# Patient Record
Sex: Female | Born: 1940 | Race: White | Hispanic: No | Smoking: Former smoker
Health system: Southern US, Community
[De-identification: ages and names within clinical notes are randomized; demographics above are authoritative.]

## PROBLEM LIST (undated history)

## (undated) ENCOUNTER — Emergency Department (HOSPITAL_COMMUNITY): Discharge status: Absent without leave | Payer: Medicare HMO

## (undated) DIAGNOSIS — T4145XA Adverse effect of unspecified anesthetic, initial encounter: Secondary | ICD-10-CM

## (undated) DIAGNOSIS — E785 Hyperlipidemia, unspecified: Secondary | ICD-10-CM

## (undated) DIAGNOSIS — IMO0001 Reserved for inherently not codable concepts without codable children: Secondary | ICD-10-CM

## (undated) DIAGNOSIS — K921 Melena: Secondary | ICD-10-CM

## (undated) DIAGNOSIS — C3490 Malignant neoplasm of unspecified part of unspecified bronchus or lung: Secondary | ICD-10-CM

## (undated) DIAGNOSIS — R51 Headache: Secondary | ICD-10-CM

## (undated) DIAGNOSIS — Z972 Presence of dental prosthetic device (complete) (partial): Secondary | ICD-10-CM

## (undated) DIAGNOSIS — D539 Nutritional anemia, unspecified: Secondary | ICD-10-CM

## (undated) DIAGNOSIS — Z973 Presence of spectacles and contact lenses: Secondary | ICD-10-CM

## (undated) DIAGNOSIS — J449 Chronic obstructive pulmonary disease, unspecified: Secondary | ICD-10-CM

## (undated) DIAGNOSIS — K219 Gastro-esophageal reflux disease without esophagitis: Secondary | ICD-10-CM

## (undated) DIAGNOSIS — I219 Acute myocardial infarction, unspecified: Secondary | ICD-10-CM

## (undated) DIAGNOSIS — R112 Nausea with vomiting, unspecified: Secondary | ICD-10-CM

## (undated) DIAGNOSIS — I2510 Atherosclerotic heart disease of native coronary artery without angina pectoris: Secondary | ICD-10-CM

## (undated) DIAGNOSIS — T8859XA Other complications of anesthesia, initial encounter: Secondary | ICD-10-CM

## (undated) DIAGNOSIS — K449 Diaphragmatic hernia without obstruction or gangrene: Secondary | ICD-10-CM

## (undated) DIAGNOSIS — N189 Chronic kidney disease, unspecified: Secondary | ICD-10-CM

## (undated) DIAGNOSIS — K5792 Diverticulitis of intestine, part unspecified, without perforation or abscess without bleeding: Secondary | ICD-10-CM

## (undated) DIAGNOSIS — I739 Peripheral vascular disease, unspecified: Secondary | ICD-10-CM

## (undated) DIAGNOSIS — I10 Essential (primary) hypertension: Secondary | ICD-10-CM

## (undated) DIAGNOSIS — M5481 Occipital neuralgia: Principal | ICD-10-CM

## (undated) DIAGNOSIS — R519 Headache, unspecified: Secondary | ICD-10-CM

## (undated) DIAGNOSIS — I7409 Other arterial embolism and thrombosis of abdominal aorta: Secondary | ICD-10-CM

## (undated) DIAGNOSIS — Q0700 Arnold-Chiari syndrome without spina bifida or hydrocephalus: Secondary | ICD-10-CM

## (undated) DIAGNOSIS — Z9889 Other specified postprocedural states: Secondary | ICD-10-CM

## (undated) DIAGNOSIS — G4489 Other headache syndrome: Principal | ICD-10-CM

## (undated) DIAGNOSIS — R0600 Dyspnea, unspecified: Secondary | ICD-10-CM

## (undated) DIAGNOSIS — J189 Pneumonia, unspecified organism: Secondary | ICD-10-CM

## (undated) DIAGNOSIS — H409 Unspecified glaucoma: Secondary | ICD-10-CM

## (undated) DIAGNOSIS — I723 Aneurysm of iliac artery: Secondary | ICD-10-CM

## (undated) DIAGNOSIS — I6529 Occlusion and stenosis of unspecified carotid artery: Secondary | ICD-10-CM

## (undated) HISTORY — DX: Gastro-esophageal reflux disease without esophagitis: K21.9

## (undated) HISTORY — DX: Occlusion and stenosis of unspecified carotid artery: I65.29

## (undated) HISTORY — DX: Atherosclerotic heart disease of native coronary artery without angina pectoris: I25.10

## (undated) HISTORY — PX: PR VEIN BYPASS GRAFT,AORTO-FEM-POP: 35551

## (undated) HISTORY — DX: Peripheral vascular disease, unspecified: I73.9

## (undated) HISTORY — DX: Other arterial embolism and thrombosis of abdominal aorta: I74.09

## (undated) HISTORY — PX: CORNEAL TRANSPLANT: SHX108

## (undated) HISTORY — DX: Aneurysm of iliac artery: I72.3

## (undated) HISTORY — DX: Essential (primary) hypertension: I10

## (undated) HISTORY — DX: Chronic obstructive pulmonary disease, unspecified: J44.9

## (undated) HISTORY — PX: ROTATOR CUFF REPAIR: SHX139

## (undated) HISTORY — PX: EYE SURGERY: SHX253

## (undated) HISTORY — DX: Other headache syndrome: G44.89

## (undated) HISTORY — DX: Unspecified glaucoma: H40.9

## (undated) HISTORY — DX: Acute myocardial infarction, unspecified: I21.9

## (undated) HISTORY — DX: Hyperlipidemia, unspecified: E78.5

## (undated) HISTORY — DX: Diaphragmatic hernia without obstruction or gangrene: K44.9

## (undated) HISTORY — PX: APPENDECTOMY: SHX54

## (undated) HISTORY — DX: Occipital neuralgia: M54.81

## (undated) HISTORY — DX: Nutritional anemia, unspecified: D53.9

## (undated) HISTORY — PX: CHOLECYSTECTOMY: SHX55

## (undated) HISTORY — DX: Reserved for inherently not codable concepts without codable children: IMO0001

## (undated) HISTORY — PX: ABDOMINAL HYSTERECTOMY: SHX81

---

## 1996-08-13 DIAGNOSIS — Q0700 Arnold-Chiari syndrome without spina bifida or hydrocephalus: Secondary | ICD-10-CM

## 1996-08-13 HISTORY — PX: OTHER SURGICAL HISTORY: SHX169

## 1996-08-13 HISTORY — DX: Arnold-Chiari syndrome without spina bifida or hydrocephalus: Q07.00

## 1997-12-20 ENCOUNTER — Inpatient Hospital Stay (HOSPITAL_COMMUNITY): Admission: RE | Admit: 1997-12-20 | Discharge: 1997-12-24 | Payer: Self-pay | Admitting: Neurosurgery

## 1998-01-18 ENCOUNTER — Ambulatory Visit (HOSPITAL_COMMUNITY): Admission: RE | Admit: 1998-01-18 | Discharge: 1998-01-18 | Payer: Self-pay | Admitting: Family Medicine

## 1998-02-07 ENCOUNTER — Ambulatory Visit (HOSPITAL_COMMUNITY): Admission: RE | Admit: 1998-02-07 | Discharge: 1998-02-07 | Payer: Self-pay | Admitting: Family Medicine

## 1998-04-10 ENCOUNTER — Ambulatory Visit (HOSPITAL_COMMUNITY): Admission: RE | Admit: 1998-04-10 | Discharge: 1998-04-10 | Payer: Self-pay | Admitting: Neurosurgery

## 1998-04-19 ENCOUNTER — Ambulatory Visit (HOSPITAL_COMMUNITY): Admission: RE | Admit: 1998-04-19 | Discharge: 1998-04-19 | Payer: Self-pay | Admitting: Neurosurgery

## 1998-06-20 ENCOUNTER — Emergency Department (HOSPITAL_COMMUNITY): Admission: EM | Admit: 1998-06-20 | Discharge: 1998-06-20 | Payer: Self-pay | Admitting: Emergency Medicine

## 1998-07-05 ENCOUNTER — Ambulatory Visit (HOSPITAL_COMMUNITY): Admission: RE | Admit: 1998-07-05 | Discharge: 1998-07-05 | Payer: Self-pay | Admitting: *Deleted

## 1998-08-19 ENCOUNTER — Ambulatory Visit (HOSPITAL_COMMUNITY): Admission: RE | Admit: 1998-08-19 | Discharge: 1998-08-19 | Payer: Self-pay | Admitting: Urology

## 1998-10-13 ENCOUNTER — Observation Stay (HOSPITAL_COMMUNITY): Admission: RE | Admit: 1998-10-13 | Discharge: 1998-10-14 | Payer: Self-pay | Admitting: General Surgery

## 1999-01-06 ENCOUNTER — Ambulatory Visit (HOSPITAL_COMMUNITY): Admission: RE | Admit: 1999-01-06 | Discharge: 1999-01-06 | Payer: Self-pay | Admitting: Neurology

## 1999-04-21 ENCOUNTER — Ambulatory Visit (HOSPITAL_COMMUNITY): Admission: RE | Admit: 1999-04-21 | Discharge: 1999-04-21 | Payer: Self-pay | Admitting: Family Medicine

## 1999-04-21 ENCOUNTER — Encounter: Payer: Self-pay | Admitting: Family Medicine

## 1999-04-26 ENCOUNTER — Ambulatory Visit (HOSPITAL_COMMUNITY): Admission: RE | Admit: 1999-04-26 | Discharge: 1999-04-26 | Payer: Self-pay | Admitting: Neurosurgery

## 1999-06-24 ENCOUNTER — Encounter: Payer: Self-pay | Admitting: Emergency Medicine

## 1999-06-24 ENCOUNTER — Inpatient Hospital Stay (HOSPITAL_COMMUNITY): Admission: EM | Admit: 1999-06-24 | Discharge: 1999-06-28 | Payer: Self-pay | Admitting: Cardiology

## 1999-06-26 ENCOUNTER — Encounter: Payer: Self-pay | Admitting: Cardiology

## 1999-07-19 ENCOUNTER — Ambulatory Visit (HOSPITAL_COMMUNITY): Admission: RE | Admit: 1999-07-19 | Discharge: 1999-07-19 | Payer: Self-pay | Admitting: Gastroenterology

## 1999-07-19 ENCOUNTER — Encounter: Payer: Self-pay | Admitting: Gastroenterology

## 1999-08-29 ENCOUNTER — Ambulatory Visit (HOSPITAL_COMMUNITY): Admission: RE | Admit: 1999-08-29 | Discharge: 1999-08-30 | Payer: Self-pay | Admitting: Cardiology

## 1999-09-16 ENCOUNTER — Encounter: Payer: Self-pay | Admitting: Emergency Medicine

## 1999-09-16 ENCOUNTER — Emergency Department (HOSPITAL_COMMUNITY): Admission: EM | Admit: 1999-09-16 | Discharge: 1999-09-16 | Payer: Self-pay | Admitting: Emergency Medicine

## 1999-10-23 ENCOUNTER — Encounter: Payer: Self-pay | Admitting: Cardiology

## 1999-10-23 ENCOUNTER — Ambulatory Visit (HOSPITAL_COMMUNITY): Admission: RE | Admit: 1999-10-23 | Discharge: 1999-10-23 | Payer: Self-pay | Admitting: Cardiology

## 1999-12-05 ENCOUNTER — Encounter (HOSPITAL_COMMUNITY): Admission: RE | Admit: 1999-12-05 | Discharge: 2000-01-03 | Payer: Self-pay | Admitting: Cardiology

## 1999-12-29 ENCOUNTER — Encounter: Payer: Self-pay | Admitting: Emergency Medicine

## 1999-12-29 ENCOUNTER — Inpatient Hospital Stay (HOSPITAL_COMMUNITY): Admission: EM | Admit: 1999-12-29 | Discharge: 2000-01-08 | Payer: Self-pay | Admitting: Emergency Medicine

## 1999-12-31 ENCOUNTER — Encounter: Payer: Self-pay | Admitting: Cardiology

## 2000-01-01 ENCOUNTER — Encounter: Payer: Self-pay | Admitting: *Deleted

## 2000-01-01 DIAGNOSIS — I219 Acute myocardial infarction, unspecified: Secondary | ICD-10-CM

## 2000-01-01 HISTORY — DX: Acute myocardial infarction, unspecified: I21.9

## 2000-01-01 HISTORY — PX: CORONARY ARTERY BYPASS GRAFT: SHX141

## 2000-01-02 ENCOUNTER — Encounter: Payer: Self-pay | Admitting: Thoracic Surgery (Cardiothoracic Vascular Surgery)

## 2000-01-03 ENCOUNTER — Encounter: Payer: Self-pay | Admitting: Thoracic Surgery (Cardiothoracic Vascular Surgery)

## 2000-01-04 ENCOUNTER — Encounter: Payer: Self-pay | Admitting: Thoracic Surgery (Cardiothoracic Vascular Surgery)

## 2000-01-05 ENCOUNTER — Encounter: Payer: Self-pay | Admitting: Thoracic Surgery (Cardiothoracic Vascular Surgery)

## 2000-01-05 HISTORY — PX: OTHER SURGICAL HISTORY: SHX169

## 2000-01-06 ENCOUNTER — Encounter: Payer: Self-pay | Admitting: Cardiothoracic Surgery

## 2000-01-16 ENCOUNTER — Emergency Department (HOSPITAL_COMMUNITY): Admission: EM | Admit: 2000-01-16 | Discharge: 2000-01-16 | Payer: Self-pay | Admitting: Emergency Medicine

## 2000-02-05 ENCOUNTER — Encounter
Admission: RE | Admit: 2000-02-05 | Discharge: 2000-02-05 | Payer: Self-pay | Admitting: Thoracic Surgery (Cardiothoracic Vascular Surgery)

## 2000-02-05 ENCOUNTER — Encounter: Payer: Self-pay | Admitting: Thoracic Surgery (Cardiothoracic Vascular Surgery)

## 2000-03-14 ENCOUNTER — Emergency Department (HOSPITAL_COMMUNITY): Admission: EM | Admit: 2000-03-14 | Discharge: 2000-03-14 | Payer: Self-pay | Admitting: Emergency Medicine

## 2000-05-01 ENCOUNTER — Encounter: Payer: Self-pay | Admitting: Emergency Medicine

## 2000-05-01 ENCOUNTER — Inpatient Hospital Stay (HOSPITAL_COMMUNITY): Admission: EM | Admit: 2000-05-01 | Discharge: 2000-05-03 | Payer: Self-pay | Admitting: Emergency Medicine

## 2000-05-02 ENCOUNTER — Encounter: Payer: Self-pay | Admitting: Cardiology

## 2000-05-23 ENCOUNTER — Ambulatory Visit (HOSPITAL_COMMUNITY): Admission: RE | Admit: 2000-05-23 | Discharge: 2000-05-23 | Payer: Self-pay | Admitting: Gastroenterology

## 2000-06-11 ENCOUNTER — Encounter: Admission: RE | Admit: 2000-06-11 | Discharge: 2000-09-09 | Payer: Self-pay | Admitting: Internal Medicine

## 2000-08-02 ENCOUNTER — Emergency Department (HOSPITAL_COMMUNITY): Admission: EM | Admit: 2000-08-02 | Discharge: 2000-08-02 | Payer: Self-pay

## 2000-09-03 ENCOUNTER — Other Ambulatory Visit: Admission: RE | Admit: 2000-09-03 | Discharge: 2000-09-03 | Payer: Self-pay | Admitting: Internal Medicine

## 2000-09-19 ENCOUNTER — Ambulatory Visit (HOSPITAL_COMMUNITY): Admission: RE | Admit: 2000-09-19 | Discharge: 2000-09-19 | Payer: Self-pay | Admitting: Internal Medicine

## 2000-09-19 ENCOUNTER — Encounter: Payer: Self-pay | Admitting: Internal Medicine

## 2000-09-25 ENCOUNTER — Ambulatory Visit (HOSPITAL_COMMUNITY): Admission: RE | Admit: 2000-09-25 | Discharge: 2000-09-25 | Payer: Self-pay | Admitting: Neurology

## 2000-09-25 ENCOUNTER — Encounter: Payer: Self-pay | Admitting: Neurology

## 2000-12-09 ENCOUNTER — Encounter: Payer: Self-pay | Admitting: Internal Medicine

## 2000-12-09 ENCOUNTER — Encounter: Admission: RE | Admit: 2000-12-09 | Discharge: 2000-12-09 | Payer: Self-pay | Admitting: Internal Medicine

## 2001-01-29 ENCOUNTER — Encounter: Payer: Self-pay | Admitting: Cardiology

## 2001-01-29 ENCOUNTER — Ambulatory Visit (HOSPITAL_COMMUNITY): Admission: RE | Admit: 2001-01-29 | Discharge: 2001-01-29 | Payer: Self-pay | Admitting: Cardiology

## 2001-03-10 ENCOUNTER — Ambulatory Visit (HOSPITAL_COMMUNITY): Admission: RE | Admit: 2001-03-10 | Discharge: 2001-03-10 | Payer: Self-pay | Admitting: Gastroenterology

## 2001-03-10 ENCOUNTER — Encounter: Payer: Self-pay | Admitting: Gastroenterology

## 2001-03-25 ENCOUNTER — Encounter: Payer: Self-pay | Admitting: Internal Medicine

## 2001-03-25 ENCOUNTER — Encounter: Admission: RE | Admit: 2001-03-25 | Discharge: 2001-03-25 | Payer: Self-pay | Admitting: Internal Medicine

## 2001-05-01 ENCOUNTER — Ambulatory Visit (HOSPITAL_COMMUNITY): Admission: RE | Admit: 2001-05-01 | Discharge: 2001-05-01 | Payer: Self-pay | Admitting: Gastroenterology

## 2001-06-02 ENCOUNTER — Ambulatory Visit (HOSPITAL_COMMUNITY): Admission: RE | Admit: 2001-06-02 | Discharge: 2001-06-02 | Payer: Self-pay | Admitting: Gastroenterology

## 2001-06-02 ENCOUNTER — Encounter: Payer: Self-pay | Admitting: Gastroenterology

## 2001-06-15 ENCOUNTER — Inpatient Hospital Stay (HOSPITAL_COMMUNITY): Admission: EM | Admit: 2001-06-15 | Discharge: 2001-06-17 | Payer: Self-pay

## 2001-06-16 ENCOUNTER — Encounter: Payer: Self-pay | Admitting: Cardiology

## 2001-12-09 ENCOUNTER — Ambulatory Visit (HOSPITAL_COMMUNITY): Admission: RE | Admit: 2001-12-09 | Discharge: 2001-12-09 | Payer: Self-pay | Admitting: Internal Medicine

## 2002-03-04 ENCOUNTER — Encounter: Admission: RE | Admit: 2002-03-04 | Discharge: 2002-03-04 | Payer: Self-pay | Admitting: Internal Medicine

## 2002-03-04 ENCOUNTER — Ambulatory Visit: Admission: RE | Admit: 2002-03-04 | Discharge: 2002-03-04 | Payer: Self-pay | Admitting: Pulmonary Disease

## 2002-03-04 ENCOUNTER — Encounter: Payer: Self-pay | Admitting: Internal Medicine

## 2002-06-19 ENCOUNTER — Emergency Department (HOSPITAL_COMMUNITY): Admission: EM | Admit: 2002-06-19 | Discharge: 2002-06-19 | Payer: Self-pay | Admitting: Emergency Medicine

## 2002-07-01 ENCOUNTER — Ambulatory Visit (HOSPITAL_COMMUNITY): Admission: RE | Admit: 2002-07-01 | Discharge: 2002-07-01 | Payer: Self-pay | Admitting: Cardiology

## 2002-07-01 ENCOUNTER — Encounter: Payer: Self-pay | Admitting: Cardiology

## 2002-07-21 ENCOUNTER — Encounter: Payer: Self-pay | Admitting: Cardiology

## 2002-07-21 ENCOUNTER — Encounter: Admission: RE | Admit: 2002-07-21 | Discharge: 2002-07-21 | Payer: Self-pay | Admitting: Cardiology

## 2002-07-22 ENCOUNTER — Encounter: Admission: RE | Admit: 2002-07-22 | Discharge: 2002-10-20 | Payer: Self-pay | Admitting: Internal Medicine

## 2003-02-23 ENCOUNTER — Encounter: Admission: RE | Admit: 2003-02-23 | Discharge: 2003-02-23 | Payer: Self-pay | Admitting: Internal Medicine

## 2003-02-23 ENCOUNTER — Encounter: Payer: Self-pay | Admitting: Internal Medicine

## 2003-02-28 ENCOUNTER — Encounter: Payer: Self-pay | Admitting: *Deleted

## 2003-02-28 ENCOUNTER — Emergency Department (HOSPITAL_COMMUNITY): Admission: EM | Admit: 2003-02-28 | Discharge: 2003-02-28 | Payer: Self-pay | Admitting: *Deleted

## 2003-03-09 ENCOUNTER — Encounter: Payer: Self-pay | Admitting: Internal Medicine

## 2003-03-09 ENCOUNTER — Encounter: Admission: RE | Admit: 2003-03-09 | Discharge: 2003-03-09 | Payer: Self-pay | Admitting: Internal Medicine

## 2003-03-29 ENCOUNTER — Emergency Department (HOSPITAL_COMMUNITY): Admission: EM | Admit: 2003-03-29 | Discharge: 2003-03-29 | Payer: Self-pay | Admitting: Emergency Medicine

## 2003-04-04 ENCOUNTER — Ambulatory Visit (HOSPITAL_COMMUNITY): Admission: RE | Admit: 2003-04-04 | Discharge: 2003-04-04 | Payer: Self-pay | Admitting: Gastroenterology

## 2003-04-04 ENCOUNTER — Encounter: Payer: Self-pay | Admitting: Gastroenterology

## 2003-04-30 ENCOUNTER — Ambulatory Visit (HOSPITAL_COMMUNITY): Admission: RE | Admit: 2003-04-30 | Discharge: 2003-04-30 | Payer: Self-pay | Admitting: Cardiology

## 2003-05-14 ENCOUNTER — Encounter (INDEPENDENT_AMBULATORY_CARE_PROVIDER_SITE_OTHER): Payer: Self-pay | Admitting: Cardiology

## 2003-05-14 ENCOUNTER — Ambulatory Visit (HOSPITAL_COMMUNITY): Admission: RE | Admit: 2003-05-14 | Discharge: 2003-05-14 | Payer: Self-pay | Admitting: Cardiology

## 2003-07-26 ENCOUNTER — Inpatient Hospital Stay (HOSPITAL_COMMUNITY): Admission: RE | Admit: 2003-07-26 | Discharge: 2003-07-27 | Payer: Self-pay | Admitting: Cardiology

## 2003-09-27 ENCOUNTER — Emergency Department (HOSPITAL_COMMUNITY): Admission: EM | Admit: 2003-09-27 | Discharge: 2003-09-27 | Payer: Self-pay | Admitting: Emergency Medicine

## 2003-09-28 ENCOUNTER — Encounter: Admission: RE | Admit: 2003-09-28 | Discharge: 2003-09-28 | Payer: Self-pay | Admitting: Internal Medicine

## 2004-02-11 ENCOUNTER — Encounter: Admission: RE | Admit: 2004-02-11 | Discharge: 2004-02-11 | Payer: Self-pay | Admitting: Internal Medicine

## 2004-02-16 ENCOUNTER — Ambulatory Visit (HOSPITAL_COMMUNITY): Admission: RE | Admit: 2004-02-16 | Discharge: 2004-02-16 | Payer: Self-pay | Admitting: Internal Medicine

## 2004-05-23 ENCOUNTER — Encounter: Admission: RE | Admit: 2004-05-23 | Discharge: 2004-05-23 | Payer: Self-pay | Admitting: Internal Medicine

## 2004-10-09 ENCOUNTER — Ambulatory Visit: Payer: Self-pay | Admitting: Pulmonary Disease

## 2004-10-15 ENCOUNTER — Inpatient Hospital Stay (HOSPITAL_COMMUNITY): Admission: AD | Admit: 2004-10-15 | Discharge: 2004-10-15 | Payer: Self-pay | Admitting: *Deleted

## 2004-11-06 ENCOUNTER — Encounter: Admission: RE | Admit: 2004-11-06 | Discharge: 2004-11-06 | Payer: Self-pay | Admitting: Cardiology

## 2004-12-27 ENCOUNTER — Ambulatory Visit: Payer: Self-pay | Admitting: Pulmonary Disease

## 2005-09-12 ENCOUNTER — Encounter: Admission: RE | Admit: 2005-09-12 | Discharge: 2005-09-12 | Payer: Self-pay | Admitting: Internal Medicine

## 2005-09-17 ENCOUNTER — Encounter: Admission: RE | Admit: 2005-09-17 | Discharge: 2005-09-17 | Payer: Self-pay | Admitting: Internal Medicine

## 2006-03-06 ENCOUNTER — Emergency Department (HOSPITAL_COMMUNITY): Admission: EM | Admit: 2006-03-06 | Discharge: 2006-03-06 | Payer: Self-pay | Admitting: Emergency Medicine

## 2006-03-12 ENCOUNTER — Ambulatory Visit: Payer: Self-pay | Admitting: Pulmonary Disease

## 2006-05-17 ENCOUNTER — Encounter: Admission: RE | Admit: 2006-05-17 | Discharge: 2006-05-17 | Payer: Self-pay | Admitting: Internal Medicine

## 2006-09-18 ENCOUNTER — Ambulatory Visit (HOSPITAL_COMMUNITY): Admission: RE | Admit: 2006-09-18 | Discharge: 2006-09-18 | Payer: Self-pay | Admitting: Cardiology

## 2006-09-28 ENCOUNTER — Inpatient Hospital Stay (HOSPITAL_COMMUNITY): Admission: EM | Admit: 2006-09-28 | Discharge: 2006-10-02 | Payer: Self-pay | Admitting: Emergency Medicine

## 2006-10-29 ENCOUNTER — Ambulatory Visit (HOSPITAL_COMMUNITY): Admission: RE | Admit: 2006-10-29 | Discharge: 2006-10-29 | Payer: Self-pay | Admitting: Family Medicine

## 2006-11-21 ENCOUNTER — Ambulatory Visit (HOSPITAL_COMMUNITY): Admission: RE | Admit: 2006-11-21 | Discharge: 2006-11-21 | Payer: Self-pay | Admitting: Internal Medicine

## 2006-12-27 ENCOUNTER — Emergency Department (HOSPITAL_COMMUNITY): Admission: EM | Admit: 2006-12-27 | Discharge: 2006-12-27 | Payer: Self-pay | Admitting: Emergency Medicine

## 2007-01-27 ENCOUNTER — Emergency Department (HOSPITAL_COMMUNITY): Admission: EM | Admit: 2007-01-27 | Discharge: 2007-01-27 | Payer: Self-pay | Admitting: Emergency Medicine

## 2007-03-28 ENCOUNTER — Emergency Department (HOSPITAL_COMMUNITY): Admission: EM | Admit: 2007-03-28 | Discharge: 2007-03-28 | Payer: Self-pay | Admitting: Emergency Medicine

## 2007-05-06 ENCOUNTER — Ambulatory Visit: Payer: Self-pay | Admitting: Vascular Surgery

## 2007-07-27 ENCOUNTER — Inpatient Hospital Stay (HOSPITAL_COMMUNITY): Admission: EM | Admit: 2007-07-27 | Discharge: 2007-07-29 | Payer: Self-pay | Admitting: Emergency Medicine

## 2007-08-05 ENCOUNTER — Encounter: Admission: RE | Admit: 2007-08-05 | Discharge: 2007-08-05 | Payer: Self-pay | Admitting: Internal Medicine

## 2007-10-16 ENCOUNTER — Emergency Department (HOSPITAL_COMMUNITY): Admission: EM | Admit: 2007-10-16 | Discharge: 2007-10-16 | Payer: Self-pay | Admitting: Emergency Medicine

## 2007-11-04 ENCOUNTER — Encounter: Admission: RE | Admit: 2007-11-04 | Discharge: 2007-11-04 | Payer: Self-pay | Admitting: Internal Medicine

## 2007-11-25 ENCOUNTER — Ambulatory Visit (HOSPITAL_COMMUNITY): Admission: RE | Admit: 2007-11-25 | Discharge: 2007-11-25 | Payer: Self-pay | Admitting: Internal Medicine

## 2008-03-11 ENCOUNTER — Ambulatory Visit (HOSPITAL_COMMUNITY): Admission: RE | Admit: 2008-03-11 | Discharge: 2008-03-11 | Payer: Self-pay | Admitting: Cardiology

## 2008-03-11 ENCOUNTER — Ambulatory Visit: Payer: Self-pay | Admitting: Surgery

## 2008-03-11 ENCOUNTER — Encounter (INDEPENDENT_AMBULATORY_CARE_PROVIDER_SITE_OTHER): Payer: Self-pay | Admitting: Cardiology

## 2008-04-06 ENCOUNTER — Ambulatory Visit: Payer: Self-pay | Admitting: Vascular Surgery

## 2008-04-10 ENCOUNTER — Emergency Department (HOSPITAL_COMMUNITY): Admission: EM | Admit: 2008-04-10 | Discharge: 2008-04-10 | Payer: Self-pay | Admitting: Emergency Medicine

## 2008-04-13 DIAGNOSIS — I723 Aneurysm of iliac artery: Secondary | ICD-10-CM

## 2008-04-13 HISTORY — DX: Aneurysm of iliac artery: I72.3

## 2008-04-26 ENCOUNTER — Ambulatory Visit: Payer: Self-pay | Admitting: Vascular Surgery

## 2008-04-26 ENCOUNTER — Ambulatory Visit (HOSPITAL_COMMUNITY): Admission: RE | Admit: 2008-04-26 | Discharge: 2008-04-26 | Payer: Self-pay | Admitting: Vascular Surgery

## 2008-05-11 ENCOUNTER — Ambulatory Visit: Payer: Self-pay | Admitting: Vascular Surgery

## 2008-08-11 ENCOUNTER — Encounter: Admission: RE | Admit: 2008-08-11 | Discharge: 2008-08-11 | Payer: Self-pay | Admitting: Cardiology

## 2008-08-26 ENCOUNTER — Ambulatory Visit: Payer: Self-pay | Admitting: Vascular Surgery

## 2008-10-19 ENCOUNTER — Ambulatory Visit: Payer: Self-pay | Admitting: Vascular Surgery

## 2009-01-08 ENCOUNTER — Emergency Department (HOSPITAL_COMMUNITY): Admission: EM | Admit: 2009-01-08 | Discharge: 2009-01-08 | Payer: Self-pay | Admitting: Emergency Medicine

## 2009-02-08 ENCOUNTER — Ambulatory Visit: Payer: Self-pay | Admitting: Vascular Surgery

## 2009-04-08 ENCOUNTER — Encounter (HOSPITAL_COMMUNITY): Admission: RE | Admit: 2009-04-08 | Discharge: 2009-06-17 | Payer: Self-pay | Admitting: Cardiology

## 2009-04-14 ENCOUNTER — Inpatient Hospital Stay (HOSPITAL_BASED_OUTPATIENT_CLINIC_OR_DEPARTMENT_OTHER): Admission: RE | Admit: 2009-04-14 | Discharge: 2009-04-14 | Payer: Self-pay | Admitting: Cardiology

## 2009-08-03 ENCOUNTER — Emergency Department (HOSPITAL_COMMUNITY): Admission: EM | Admit: 2009-08-03 | Discharge: 2009-08-03 | Payer: Self-pay | Admitting: Emergency Medicine

## 2009-08-16 ENCOUNTER — Ambulatory Visit: Payer: Self-pay | Admitting: Vascular Surgery

## 2009-11-10 ENCOUNTER — Encounter: Admission: RE | Admit: 2009-11-10 | Discharge: 2009-11-10 | Payer: Self-pay | Admitting: Internal Medicine

## 2009-12-03 ENCOUNTER — Observation Stay (HOSPITAL_COMMUNITY): Admission: EM | Admit: 2009-12-03 | Discharge: 2009-12-05 | Payer: Self-pay | Admitting: Emergency Medicine

## 2010-02-16 ENCOUNTER — Encounter: Admission: RE | Admit: 2010-02-16 | Discharge: 2010-02-16 | Payer: Self-pay | Admitting: Internal Medicine

## 2010-03-16 ENCOUNTER — Ambulatory Visit: Payer: Self-pay | Admitting: Vascular Surgery

## 2010-03-28 ENCOUNTER — Encounter: Payer: Self-pay | Admitting: Vascular Surgery

## 2010-03-28 ENCOUNTER — Inpatient Hospital Stay (HOSPITAL_COMMUNITY): Admission: RE | Admit: 2010-03-28 | Discharge: 2010-03-29 | Payer: Self-pay | Admitting: Vascular Surgery

## 2010-03-28 ENCOUNTER — Ambulatory Visit: Payer: Self-pay | Admitting: Vascular Surgery

## 2010-03-29 HISTORY — PX: CAROTID ENDARTERECTOMY: SUR193

## 2010-04-12 ENCOUNTER — Ambulatory Visit: Payer: Self-pay | Admitting: Vascular Surgery

## 2010-05-30 ENCOUNTER — Emergency Department (HOSPITAL_COMMUNITY): Admission: EM | Admit: 2010-05-30 | Discharge: 2010-05-30 | Payer: Self-pay | Admitting: Emergency Medicine

## 2010-08-05 ENCOUNTER — Emergency Department (HOSPITAL_COMMUNITY)
Admission: EM | Admit: 2010-08-05 | Discharge: 2010-08-05 | Payer: Self-pay | Source: Home / Self Care | Admitting: Emergency Medicine

## 2010-09-08 ENCOUNTER — Other Ambulatory Visit (HOSPITAL_COMMUNITY): Payer: Self-pay | Admitting: Internal Medicine

## 2010-09-08 DIAGNOSIS — Z139 Encounter for screening, unspecified: Secondary | ICD-10-CM

## 2010-09-13 ENCOUNTER — Ambulatory Visit (HOSPITAL_COMMUNITY)
Admission: RE | Admit: 2010-09-13 | Discharge: 2010-09-13 | Disposition: A | Discharge status: Home / Self Care | Payer: Medicare Other | Source: Ambulatory Visit | Attending: Internal Medicine | Admitting: Internal Medicine

## 2010-09-13 DIAGNOSIS — Z1231 Encounter for screening mammogram for malignant neoplasm of breast: Secondary | ICD-10-CM | POA: Insufficient documentation

## 2010-09-13 DIAGNOSIS — Z139 Encounter for screening, unspecified: Secondary | ICD-10-CM

## 2010-10-11 ENCOUNTER — Encounter (INDEPENDENT_AMBULATORY_CARE_PROVIDER_SITE_OTHER): Payer: Medicare Other

## 2010-10-11 ENCOUNTER — Other Ambulatory Visit (INDEPENDENT_AMBULATORY_CARE_PROVIDER_SITE_OTHER): Payer: Medicare Other

## 2010-10-11 ENCOUNTER — Ambulatory Visit (INDEPENDENT_AMBULATORY_CARE_PROVIDER_SITE_OTHER): Payer: Medicare Other | Admitting: Vascular Surgery

## 2010-10-11 DIAGNOSIS — I6529 Occlusion and stenosis of unspecified carotid artery: Secondary | ICD-10-CM

## 2010-10-11 DIAGNOSIS — Z48812 Encounter for surgical aftercare following surgery on the circulatory system: Secondary | ICD-10-CM

## 2010-10-11 DIAGNOSIS — M79609 Pain in unspecified limb: Secondary | ICD-10-CM

## 2010-10-12 NOTE — Assessment & Plan Note (Signed)
OFFICE VISIT  [name], [name] DOB:  Apr 18, 1941                                       10/11/2010 [id number]  I saw the patient in the office today for continued follow-up of her carotid disease.  There is a pleasant 70 year old woman who I have been following with peripheral vascular disease and carotid disease.  The stenosis on the left carotid progressed to greater than 80%, and left carotid endarterectomy was recommended in order to lower her risk of future stroke.  She underwent left carotid endarterectomy with Dacron patch angioplasty in August 2011, and she comes in for a 38-month follow- up visit.  Since I saw her last, she has had no history of stroke, TIAs, expressive or receptive aphasia or amaurosis fugax.  She does complain of some pain in her leg she states which occurs at night mostly.  She describes cramps in her legs.  She also describes some pain in the anterior aspect of her legs.  I do not get any history of claudication or rest pain.  She has had no history of DVT or phlebitis.  PAST MEDICAL HISTORY:  Significant for hypertension and hypercholesterolemia, both which is stable on her current medications.  SOCIAL HISTORY:  She is married.  She has 4 children.  She quit tobacco 10 years ago.  REVIEW OF SYSTEMS:  CARDIOVASCULAR:  She does occasionally get some mild chest tightness, but this has been stable.  She admits to dyspnea on exertion.  She has had no history of stroke or TIAs.  No history of DVT or phlebitis. PULMONARY:  She has a history of bronchitis and asthma. GU:  She uses had some urinary frequency.  PHYSICAL EXAMINATION:  Blood pressure is 169/75, heart rate is 70, saturation 96%.  This is a pleasant 9-year woman who appears her stated age.  Lungs:  Clear bilaterally to auscultation without rales, rhonchi or wheezing.  Cardiovascular:  I do not detect any carotid bruits.  She has a regular rate and rhythm.  She  has palpable femoral, popliteal and pedal pulses bilaterally.  Abdomen:  Soft and nontender with normal pitched bowel sounds.  Neurologic:  She has no focal weakness or paresthesias.  I did independently interpret her carotid duplex scan which shows that her left carotid endarterectomy site is widely patent without evidence of restenosis.  She has a 40% to 59% right carotid stenosis.  Both vertebral arteries are patent with normally directed flow.  Given her leg symptoms, we obtained arterial Doppler study which I also independently interpreted and shows biphasic Doppler signals in both feet in the dorsalis pedis and posterior tibial positions.  She has an ABI of 90% on the right and 88% on the left.  I have reassured her that she has no evidence of significant carotid stenosis at this time.  I recommend a follow-up carotid duplex scan in 1 year, and I have scheduled this.  With respect to her leg pain, I do not think it is related to arterial insufficiency based on her exam, her history and her Doppler findings.  Likewise it does not appear to be venous in origin.  Even though she is not having back pain, if this persists, consideration could be given to a neurologic evaluation to see if she might be having disk problem explaining her pain.  I will see her back  in 1 year.  She knows to call sooner if she has problems.    Judeth Cornfield. Scot Dock, M.D. Electronically Signed  CSD/MEDQ  D:  10/11/2010  T:  10/12/2010  Job:  [id number]

## 2010-10-18 NOTE — Procedures (Unsigned)
CAROTID DUPLEX EXAM  INDICATION:  Carotid artery disease.  HISTORY: Diabetes:  No. Cardiac:  No. Hypertension:  Yes. Smoking:  Previous. Previous Surgery:  Left carotid endarterectomy 03/29/2010. CV History:  No. Amaurosis Fugax No, Paresthesias No, Hemiparesis No                                      RIGHT             LEFT Brachial systolic pressure:         176               172 Brachial Doppler waveforms:         Normal            Normal Vertebral direction of flow:        Antegrade         Antegrade DUPLEX VELOCITIES (cm/sec) CCA peak systolic                   77                83 ECA peak systolic                   116               [id number] ICA peak systolic                   145               [id number] ICA end diastolic                   38                35 PLAQUE MORPHOLOGY:                  Heterogenous      Intimal wall thickening PLAQUE AMOUNT:                      Moderate          Minimal PLAQUE LOCATION:                    ICA               CCA, bifurcation  IMPRESSION: 1. Right ICA velocities suggest 40-59% stenosis. 2. Patent left carotid endarterectomy site with no evidence of     restenosis.  ___________________________________________ [name]. Scot Dock, M.D.  EM/MEDQ  D:  10/11/2010  T:  10/11/2010  Job:  [id number]

## 2010-10-19 ENCOUNTER — Ambulatory Visit
Admission: RE | Admit: 2010-10-19 | Discharge: 2010-10-19 | Disposition: A | Discharge status: Home / Self Care | Payer: Medicare Other | Source: Ambulatory Visit | Attending: Rheumatology | Admitting: Rheumatology

## 2010-10-19 ENCOUNTER — Other Ambulatory Visit: Payer: Self-pay | Admitting: Rheumatology

## 2010-10-19 DIAGNOSIS — R634 Abnormal weight loss: Secondary | ICD-10-CM

## 2010-10-19 DIAGNOSIS — R700 Elevated erythrocyte sedimentation rate: Secondary | ICD-10-CM

## 2010-10-19 DIAGNOSIS — R140 Abdominal distension (gaseous): Secondary | ICD-10-CM

## 2010-10-23 LAB — DIFFERENTIAL
Basophils Absolute: 0 10*3/uL (ref 0.0–0.1)
Basophils Relative: 0 % (ref 0–1)
Monocytes Relative: 8 % (ref 3–12)
Neutro Abs: 7.3 10*3/uL (ref 1.7–7.7)
Neutrophils Relative %: 64 % (ref 43–77)

## 2010-10-23 LAB — CBC
Hemoglobin: 11.6 g/dL — ABNORMAL LOW (ref 12.0–15.0)
MCHC: 33.6 g/dL (ref 30.0–36.0)
RDW: 14.7 % (ref 11.5–15.5)

## 2010-10-23 LAB — BASIC METABOLIC PANEL
BUN: 9 mg/dL (ref 6–23)
Creatinine, Ser: 0.83 mg/dL (ref 0.4–1.2)
GFR calc non Af Amer: >60 mL/min (ref >60)
Glucose, Bld: 128 mg/dL — ABNORMAL HIGH (ref 70–99)
Potassium: 3.7 mEq/L (ref 3.5–5.1)

## 2010-10-25 LAB — CBC
HCT: 37.1 % (ref 36.0–46.0)
Hemoglobin: 12.3 g/dL (ref 12.0–15.0)
MCH: 28.1 pg (ref 26.0–34.0)
MCHC: 33.2 g/dL (ref 30.0–36.0)
MCV: 84.9 fL (ref 78.0–100.0)
Platelets: 254 K/uL (ref 150–400)
RBC: 4.37 MIL/uL (ref 3.87–5.11)
RDW: 13.9 % (ref 11.5–15.5)
WBC: 8.6 K/uL (ref 4.0–10.5)

## 2010-10-25 LAB — DIFFERENTIAL
Basophils Absolute: 0 K/uL (ref 0.0–0.1)
Basophils Relative: 0 % (ref 0–1)
Eosinophils Absolute: 0.2 K/uL (ref 0.0–0.7)
Eosinophils Relative: 2 % (ref 0–5)
Lymphocytes Relative: 36 % (ref 12–46)
Lymphs Abs: 3.1 10*3/uL (ref 0.7–4.0)
Monocytes Absolute: 0.6 K/uL (ref 0.1–1.0)
Monocytes Relative: 7 % (ref 3–12)
Neutro Abs: 4.7 10*3/uL (ref 1.7–7.7)
Neutrophils Relative %: 54 % (ref 43–77)

## 2010-10-25 LAB — POCT I-STAT, CHEM 8
BUN: 10 mg/dL (ref 6–23)
Calcium, Ion: 1.2 mmol/L (ref 1.12–1.32)
Chloride: 106 mEq/L (ref 96–112)
Creatinine, Ser: 0.7 mg/dL (ref 0.4–1.2)
Glucose, Bld: 104 mg/dL — ABNORMAL HIGH (ref 70–99)
HCT: 38 % (ref 36.0–46.0)
Hemoglobin: 12.9 g/dL (ref 12.0–15.0)
Potassium: 3.7 mEq/L (ref 3.5–5.1)
Sodium: 141 meq/L (ref 135–145)
TCO2: 27 mmol/L (ref 0–100)

## 2010-10-25 LAB — GLUCOSE, CAPILLARY: Glucose-Capillary: 100 mg/dL — ABNORMAL HIGH (ref 70–99)

## 2010-10-27 LAB — BASIC METABOLIC PANEL
Chloride: 109 mEq/L (ref 96–112)
Creatinine, Ser: 0.65 mg/dL (ref 0.4–1.2)
GFR calc Af Amer: >60 mL/min (ref >60)
Potassium: 4.1 mEq/L (ref 3.5–5.1)

## 2010-10-27 LAB — URINALYSIS, ROUTINE W REFLEX MICROSCOPIC
Ketones, ur: NEGATIVE mg/dL
Nitrite: NEGATIVE
Protein, ur: NEGATIVE mg/dL
Urobilinogen, UA: 0.2 mg/dL (ref 0.0–1.0)

## 2010-10-27 LAB — COMPREHENSIVE METABOLIC PANEL
AST: 22 U/L (ref 0–37)
Albumin: 4 g/dL (ref 3.5–5.2)
CO2: 25 mEq/L (ref 19–32)
Chloride: 106 mEq/L (ref 96–112)
Glucose, Bld: 90 mg/dL (ref 70–99)
Potassium: 4 mEq/L (ref 3.5–5.1)
Sodium: 138 mEq/L (ref 135–145)
Total Protein: 7.1 g/dL (ref 6.0–8.3)

## 2010-10-27 LAB — CBC
HCT: 30.5 % — ABNORMAL LOW (ref 36.0–46.0)
HCT: 36.9 % (ref 36.0–46.0)
Hemoglobin: 12.6 g/dL (ref 12.0–15.0)
MCHC: 34.1 g/dL (ref 30.0–36.0)
MCV: 85.2 fL (ref 78.0–100.0)
Platelets: 203 10*3/uL (ref 150–400)
RBC: 3.58 MIL/uL — ABNORMAL LOW (ref 3.87–5.11)
RBC: 4.32 MIL/uL (ref 3.87–5.11)
WBC: 12.5 10*3/uL — ABNORMAL HIGH (ref 4.0–10.5)

## 2010-10-27 LAB — ABO/RH: ABO/RH(D): A POS

## 2010-10-27 LAB — APTT: aPTT: 29 seconds (ref 24–37)

## 2010-10-27 LAB — SURGICAL PCR SCREEN
MRSA, PCR: NEGATIVE
Staphylococcus aureus: NEGATIVE

## 2010-10-27 LAB — PROTIME-INR: INR: 0.95 (ref 0.00–1.49)

## 2010-10-31 LAB — COMPREHENSIVE METABOLIC PANEL
ALT: 16 U/L (ref 0–35)
CO2: 25 mEq/L (ref 19–32)
Calcium: 9.5 mg/dL (ref 8.4–10.5)
GFR calc non Af Amer: >60 mL/min (ref >60)
Glucose, Bld: 100 mg/dL — ABNORMAL HIGH (ref 70–99)
Sodium: 140 mEq/L (ref 135–145)

## 2010-10-31 LAB — CBC
HCT: 36.9 % (ref 36.0–46.0)
Hemoglobin: 12.9 g/dL (ref 12.0–15.0)
MCHC: 34.8 g/dL (ref 30.0–36.0)
MCV: 87.7 fL (ref 78.0–100.0)
RBC: 4.2 MIL/uL (ref 3.87–5.11)

## 2010-10-31 LAB — DIFFERENTIAL
Basophils Relative: 1 % (ref 0–1)
Eosinophils Absolute: 0.2 10*3/uL (ref 0.0–0.7)
Lymphs Abs: 3 10*3/uL (ref 0.7–4.0)
Neutrophils Relative %: 57 % (ref 43–77)

## 2010-10-31 LAB — CARDIAC PANEL(CRET KIN+CKTOT+MB+TROPI)
CK, MB: 1.2 ng/mL (ref 0.3–4.0)
Relative Index: INVALID (ref 0.0–2.5)
Relative Index: INVALID (ref 0.0–2.5)
Total CK: 60 U/L (ref 7–177)
Troponin I: 0.01 ng/mL (ref 0.00–0.06)
Troponin I: 0.02 ng/mL (ref 0.00–0.06)

## 2010-10-31 LAB — TROPONIN I: Troponin I: 0.02 ng/mL (ref 0.00–0.06)

## 2010-10-31 LAB — LIPID PANEL
HDL: 43 mg/dL (ref >39)
Triglycerides: 63 mg/dL (ref <150)
VLDL: 13 mg/dL (ref 0–40)

## 2010-10-31 LAB — POCT CARDIAC MARKERS

## 2010-10-31 LAB — D-DIMER, QUANTITATIVE: D-Dimer, Quant: 0.37 ug/mL-FEU (ref 0.00–0.48)

## 2010-11-13 LAB — COMPREHENSIVE METABOLIC PANEL
AST: 21 U/L (ref 0–37)
Albumin: 4 g/dL (ref 3.5–5.2)
BUN: 10 mg/dL (ref 6–23)
Calcium: 9.4 mg/dL (ref 8.4–10.5)
Chloride: 104 mEq/L (ref 96–112)
Creatinine, Ser: 0.62 mg/dL (ref 0.4–1.2)
GFR calc Af Amer: >60 mL/min (ref >60)
Total Protein: 8 g/dL (ref 6.0–8.3)

## 2010-11-13 LAB — CBC
HCT: 37.8 % (ref 36.0–46.0)
MCV: 88.7 fL (ref 78.0–100.0)
Platelets: 232 10*3/uL (ref 150–400)
RDW: 13.4 % (ref 11.5–15.5)
WBC: 8.5 10*3/uL (ref 4.0–10.5)

## 2010-11-13 LAB — DIFFERENTIAL
Basophils Absolute: 0.1 10*3/uL (ref 0.0–0.1)
Eosinophils Relative: 3 % (ref 0–5)
Lymphocytes Relative: 35 % (ref 12–46)
Lymphs Abs: 3 10*3/uL (ref 0.7–4.0)
Monocytes Absolute: 0.7 10*3/uL (ref 0.1–1.0)
Monocytes Relative: 9 % (ref 3–12)
Neutro Abs: 4.5 10*3/uL (ref 1.7–7.7)

## 2010-11-13 LAB — APTT: aPTT: 28 seconds (ref 24–37)

## 2010-12-02 ENCOUNTER — Inpatient Hospital Stay (HOSPITAL_COMMUNITY)
Admission: EM | Admit: 2010-12-02 | Discharge: 2010-12-06 | DRG: 287 | Disposition: A | Discharge status: Home / Self Care | Payer: Medicare Other | Attending: Cardiology | Admitting: Cardiology

## 2010-12-02 ENCOUNTER — Emergency Department (HOSPITAL_COMMUNITY): Payer: Medicare Other

## 2010-12-02 DIAGNOSIS — Z7982 Long term (current) use of aspirin: Secondary | ICD-10-CM

## 2010-12-02 DIAGNOSIS — Z951 Presence of aortocoronary bypass graft: Secondary | ICD-10-CM

## 2010-12-02 DIAGNOSIS — Z87891 Personal history of nicotine dependence: Secondary | ICD-10-CM

## 2010-12-02 DIAGNOSIS — I2510 Atherosclerotic heart disease of native coronary artery without angina pectoris: Secondary | ICD-10-CM | POA: Diagnosis present

## 2010-12-02 DIAGNOSIS — K219 Gastro-esophageal reflux disease without esophagitis: Secondary | ICD-10-CM | POA: Diagnosis present

## 2010-12-02 DIAGNOSIS — Z888 Allergy status to other drugs, medicaments and biological substances status: Secondary | ICD-10-CM

## 2010-12-02 DIAGNOSIS — I209 Angina pectoris, unspecified: Principal | ICD-10-CM | POA: Diagnosis present

## 2010-12-02 DIAGNOSIS — I739 Peripheral vascular disease, unspecified: Secondary | ICD-10-CM | POA: Diagnosis present

## 2010-12-02 DIAGNOSIS — I10 Essential (primary) hypertension: Secondary | ICD-10-CM | POA: Diagnosis present

## 2010-12-02 DIAGNOSIS — E7800 Pure hypercholesterolemia, unspecified: Secondary | ICD-10-CM | POA: Diagnosis present

## 2010-12-02 DIAGNOSIS — Z7902 Long term (current) use of antithrombotics/antiplatelets: Secondary | ICD-10-CM

## 2010-12-02 DIAGNOSIS — Z8249 Family history of ischemic heart disease and other diseases of the circulatory system: Secondary | ICD-10-CM

## 2010-12-02 LAB — COMPREHENSIVE METABOLIC PANEL
BUN: 9 mg/dL (ref 6–23)
CO2: 25 mEq/L (ref 19–32)
Calcium: 9.1 mg/dL (ref 8.4–10.5)
Chloride: 109 mEq/L (ref 96–112)
Creatinine, Ser: 0.74 mg/dL (ref 0.4–1.2)
GFR calc non Af Amer: >60 mL/min (ref >60)
Total Bilirubin: 0.5 mg/dL (ref 0.3–1.2)

## 2010-12-02 LAB — CK TOTAL AND CKMB (NOT AT ARMC): CK, MB: 0.8 ng/mL (ref 0.3–4.0)

## 2010-12-02 LAB — CBC
HCT: 37.1 % (ref 36.0–46.0)
Hemoglobin: 12.8 g/dL (ref 12.0–15.0)
MCV: 83 fL (ref 78.0–100.0)
RBC: 4.47 MIL/uL (ref 3.87–5.11)
RDW: 14.3 % (ref 11.5–15.5)
WBC: 8.8 10*3/uL (ref 4.0–10.5)

## 2010-12-02 LAB — BASIC METABOLIC PANEL
Chloride: 108 mEq/L (ref 96–112)
GFR calc non Af Amer: >60 mL/min (ref >60)
Glucose, Bld: 116 mg/dL — ABNORMAL HIGH (ref 70–99)
Potassium: 3.5 mEq/L (ref 3.5–5.1)
Sodium: 140 mEq/L (ref 135–145)

## 2010-12-02 LAB — APTT: aPTT: 29 seconds (ref 24–37)

## 2010-12-02 LAB — TROPONIN I: Troponin I: 0.01 ng/mL (ref 0.00–0.06)

## 2010-12-02 LAB — GLUCOSE, CAPILLARY: Glucose-Capillary: 149 mg/dL — ABNORMAL HIGH (ref 70–99)

## 2010-12-02 LAB — HEMOGLOBIN A1C: Mean Plasma Glucose: 126 mg/dL — ABNORMAL HIGH (ref <117)

## 2010-12-02 LAB — PROTIME-INR: Prothrombin Time: 13.1 seconds (ref 11.6–15.2)

## 2010-12-03 ENCOUNTER — Inpatient Hospital Stay (HOSPITAL_COMMUNITY): Payer: Medicare Other

## 2010-12-03 LAB — CARDIAC PANEL(CRET KIN+CKTOT+MB+TROPI)
CK, MB: 1 ng/mL (ref 0.3–4.0)
Relative Index: INVALID (ref 0.0–2.5)
Troponin I: 0.01 ng/mL (ref 0.00–0.06)

## 2010-12-03 LAB — GLUCOSE, CAPILLARY
Glucose-Capillary: 124 mg/dL — ABNORMAL HIGH (ref 70–99)
Glucose-Capillary: 93 mg/dL (ref 70–99)

## 2010-12-03 LAB — CBC
HCT: 37.6 % (ref 36.0–46.0)
Hemoglobin: 12.7 g/dL (ref 12.0–15.0)
RBC: 4.45 MIL/uL (ref 3.87–5.11)
WBC: 7.6 10*3/uL (ref 4.0–10.5)

## 2010-12-03 LAB — LIPID PANEL
Cholesterol: 157 mg/dL (ref 0–200)
LDL Cholesterol: 91 mg/dL (ref 0–99)
VLDL: 18 mg/dL (ref 0–40)

## 2010-12-03 MED ORDER — TECHNETIUM TC 99M TETROFOSMIN IV KIT
10.0000 | PACK | Freq: Once | INTRAVENOUS | Status: AC | PRN
  Administered 2010-12-03: 10 via INTRAVENOUS

## 2010-12-03 MED ORDER — TECHNETIUM TC 99M TETROFOSMIN IV KIT
30.0000 | PACK | Freq: Once | INTRAVENOUS | Status: AC | PRN
  Administered 2010-12-03: 30 via INTRAVENOUS

## 2010-12-04 ENCOUNTER — Other Ambulatory Visit (HOSPITAL_COMMUNITY): Payer: Medicare Other

## 2010-12-04 LAB — CBC
HCT: 33.2 % — ABNORMAL LOW (ref 36.0–46.0)
Hemoglobin: 11.1 g/dL — ABNORMAL LOW (ref 12.0–15.0)
MCHC: 33.4 g/dL (ref 30.0–36.0)
RBC: 3.9 MIL/uL (ref 3.87–5.11)

## 2010-12-04 LAB — GLUCOSE, CAPILLARY
Glucose-Capillary: 134 mg/dL — ABNORMAL HIGH (ref 70–99)
Glucose-Capillary: 87 mg/dL (ref 70–99)
Glucose-Capillary: 95 mg/dL (ref 70–99)

## 2010-12-05 LAB — CBC
HCT: 34.1 % — ABNORMAL LOW (ref 36.0–46.0)
Platelets: 238 10*3/uL (ref 150–400)
RDW: 14.6 % (ref 11.5–15.5)
WBC: 7.4 10*3/uL (ref 4.0–10.5)

## 2010-12-05 LAB — HEPARIN LEVEL (UNFRACTIONATED): Heparin Unfractionated: 0.38 IU/mL (ref 0.30–0.70)

## 2010-12-05 LAB — GLUCOSE, CAPILLARY
Glucose-Capillary: 107 mg/dL — ABNORMAL HIGH (ref 70–99)
Glucose-Capillary: 158 mg/dL — ABNORMAL HIGH (ref 70–99)

## 2010-12-06 LAB — GLUCOSE, CAPILLARY
Glucose-Capillary: 96 mg/dL (ref 70–99)
Glucose-Capillary: 105 mg/dL — ABNORMAL HIGH (ref 70–99)

## 2010-12-14 NOTE — Discharge Summary (Signed)
NAMECADI, SEAHORN NO.:  [id number]  MEDICAL RECORD NO.:  [id number]          PATIENT TYPE:  LOCATION:                                 FACILITY:  PHYSICIAN:  Hawley Pavia N. Terrence Dupont, M.D. DATE OF BIRTH:  Dec 30, 1940  DATE OF ADMISSION:  12/02/2010 DATE OF DISCHARGE:  12/06/2010                              DISCHARGE SUMMARY   ADMITTING DIAGNOSES: 1. Chest pain rule out myocardial infarction. 2. Coronary artery disease status post coronary artery bypass graft in     the past. 3. Hypertension. 4. Glucose intolerance. 5. History of bronchial asthma. 6. Hypercholesteremia. 7. History of tobacco abuse. 8. Positive family history of coronary artery disease. 9. Peripheral vascular disease. 10.Gastroesophageal reflux disease.  DISCHARGE DIAGNOSES: 1. Stable angina status post left cardiac catheterization status post     Persantine Myoview positive for ischemia in the anteroapical wall     status post left cath. 2. Coronary artery disease. 3. Status post coronary artery bypass graft in the past. 4. Hypertension. 5. Glucose intolerance. 6. History of bronchial asthma. 7. Hypercholesteremia. 8. History of tobacco abuse. 9. Positive family history of coronary artery disease. 10.Peripheral vascular disease. 11.Status post bilateral common iliac stenting and left external iliac     stenting. 12.Also, history of left carotid endarterectomy.  DISCHARGE HOME MEDICATIONS: 1. Enteric-coated aspirin 81 mg 1 tablet daily. 2. Plavix 75 mg 1 tablet daily. 3. Amlodipine 10 mg 1 tablet daily. 4. Nitro-Dur patch 0.4 mg per hour daily. 5. Nitrostat 0.4 mg sublingual, use as directed. 6. Advair Diskus 250/50 one puff twice daily. 7. Losartan 100 mg 1 tablet daily. 8. Crestor 10 mg 1 tablet daily. 9. Pepcid 20 mg 1 tablet twice daily. 10.Prednisolone acetate ophthalmic suspension 1% in right eye as     before.  DIET:  Low salt, low cholesterol.  ACTIVITY:  Increase  activity slowly.  Postcardiac cath instructions have been given.  Followup with me in 1 week.  Condition at discharge is stable.  BRIEF HISTORY AND HOSPITAL COURSE:  [name] is a 70 year old white female with past medical history significant for coronary artery disease status post PCI to LAD and diagonal-1 in the past status post CABG x3 in May 2001.  She had LIMA to LAD, saphenous vein graft to diagonal-1 which was occluded in the past, saphenous vein graft to distal RCA, hypertension, hypercholesteremia, peripheral vascular disease, status post bilateral common iliac stenting and left external iliac stenting, left carotid endarterectomy, history of bronchial asthma, history of tobacco abuse, GERD, complains of retrosternal chest pain off and on since yesterday described as elephant sitting on her chest, radiating to the left arm, graded 8/10, relieves with sublingual nitro.  Denies any nausea, vomiting, or diaphoresis.  Denies shortness of breath.  Denies palpitation, lightheadedness, or syncope.  Denies relation of chest pain to food, breathing, or movement.  PAST MEDICAL HISTORY:  As above.  PAST SURGICAL HISTORY:  She had CABG in May 2001 as above status post cholecystectomy, status post hysterectomy, status post appendectomy, status post left carotid endarterectomy, status post right eye cataract surgery.  ALLERGIES:  She is allergic  to [place], [place], and ACTONEL.  MEDICATION AT HOME:  She is on aspirin, Plavix, amlodipine, Cozaar, Crestor, Pepcid, Advair.  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 Smoked one and a half packs per day for 37 years, quit in 2004.  No history of alcohol abuse.  FAMILY HISTORY:  Positive for coronary artery disease.  PHYSICAL EXAMINATION:  GENERAL:  She was alert, awake, oriented x3 in no acute distress. VITAL SIGNS:  Blood pressure was 142/63, pulse was 68 and regular. EYES:  Conjunctivae were pink. NECK:   Supple, no JVD, no bruit. LUNGS:  Clear to auscultation without rhonchi or rales. CARDIOVASCULAR:  S1 and S2 was normal.  There was soft systolic murmur. There was no S3 gallop. ABDOMEN:  Soft, bowel sounds were present, nontender. EXTREMITIES:  There was no clubbing, cyanosis, or edema.  LABORATORY DATA:  Hemoglobin was 12.8, hematocrit 37.1, white count of 8.8.  Sodium was 140, potassium 3.5, glucose was 116, BUN 10, creatinine 0.72.  Her two sets of cardiac enzymes were negative.  Her hemoglobin A1c was 6.0.  Cholesterol was 157, LDL 40, LDL 91, HDL 48, triglycerides were 91.  Persantine Myoview done on December 03, 2010, showed stress- induced ischemia in the anteroapical region with EF of 81%.  Chest x-ray showed cardiomegaly without pulmonary edema.  BRIEF HOSPITAL COURSE:  The patient was admitted to telemetry unit.  MI was ruled out by serial enzymes and EKG.  The patient subsequently underwent Persantine Myoview which showed anteroapical wall ischemia with normal EF.  Discussed with the patient regarding left cath, possible PTCA and stenting, its risks and benefits, and consented for the procedure.  The patient underwent left cardiac cath with selective left and right coronary angiography, visualization of saphenous vein graft, LIMA graft aortography as per procedure report.  The patient tolerated procedure well.  There were no complications.  Postprocedure, the patient did not have any episodes of chest pain.  Her groin is stable with no evidence of hematoma or bruit.  The patient has been ambulating in room without any problems.  The patient will be discharged home on above medications and will be followed up in my office in 1 week.     Allegra Lai. Terrence Dupont, M.D.     MNH/MEDQ  D:  12/05/2010  T:  12/06/2010  Job:  [id number]  Electronically Signed by Charolette Forward M.D. on 12/14/2010 08:34:10 AM

## 2010-12-14 NOTE — Cardiovascular Report (Signed)
NAMECARRISSA, NEEPER                ACCOUNT NO.:  [id number]  MEDICAL RECORD NO.:  [id number]           PATIENT TYPE:  I  LOCATION:  [id number]                         FACILITY:  [facility]  PHYSICIAN:  Kerrick Miler N. Terrence Dupont, M.D. DATE OF BIRTH:  [birth year]  DATE OF PROCEDURE:  12/05/2010 DATE OF DISCHARGE:                           CARDIAC CATHETERIZATION   PROCEDURE:  Left cardiac catheterization with selective left and right coronary angiography, left ventriculography, aortography, visualization of saphenous vein graft, and left internal mammary artery graft via right groin using Judkins technique.  SURGEON:  Allegra Lai. Terrence Dupont, MD  INDICATIONS FOR PROCEDURE:  Ms. Deshotel is a 70 year old white female with past medical history significant for coronary artery disease status post PTCA and stenting to LAD and diagonal in the past, had aggressive restenosis and subsequently had CABG x3 in May 2001.  She had LIMA to LAD, saphenous vein graft to diagonal 1 which was occluded from prior cath, and saphenous vein graft to distal RCA, hypertension, hypercholesteremia, peripheral vascular disease status post bilateral common iliac stenting and left external iliac stenting.  Also had left carotid endarterectomy, history of bronchial asthma, history of tobacco abuse, GERD, complains of recurrent retrosternal chest pain off and on since yesterday described as elephant sitting on the chest, radiating to the left arm, grade 8/10, relieved with sublingual nitro.  Denies any nausea, vomiting, or diaphoresis.  Denies any shortness of breath. Denies any palpitation, lightheadedness, or syncope.  Denies any relation of chest pain to food, breathing, or movement.  PAST MEDICAL HISTORY:  As above.  PAST SURGICAL HISTORY:  She had CABG x3 in May 2001 as above, status post cholecystectomy, status post hysterectomy, appendectomy, status post left carotid endarterectomy and also had right cataract surgery in the  past.  ALLERGIES:  She is allergic to [place], [place], AND ACTONEL.  HOME MEDICATIONS:  She was on aspirin, Plavix, amlodipine, Cozaar, Crestor, Pepcid, and Advair.  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  Smoked 1-1/2 pack per day for 37 years, quit in 2004.  No history of alcohol abuse.  FAMILY HISTORY:  Positive for coronary artery disease.  PHYSICAL EXAMINATION:  GENERAL:  She was alert, awake, and oriented x3. VITAL SIGNS:  Blood pressure was 142/63, pulse was 68 and regular. HEENT:  Conjunctivae were pink. NECK:  Supple.  No JVD and no bruit. LUNGS:  Clear to auscultation without rhonchi or rales. CARDIOVASCULAR:  S1-S2 was normal.  There was soft systolic murmur. There was no S3 gallop. ABDOMEN:  Soft, bowel sounds were present, and nontender. EXTREMITIES:  There was no clubbing, cyanosis, or edema.  IMAGING:  Her EKG showed normal sinus rhythm with nonspecific T-wave changes.  Two sets of cardiac enzymes were negative.  The patient was admitted to Telemetry Unit.  MI was ruled out by serial enzymes and EKG. The patient subsequently underwent Persantine Myoview which showed anteroapical wall ischemia with normal ejection fraction.  I discussed with the patient regarding Persantine Myoview results and left cath, possible PTCA and stenting, its risks and benefits, i.e. death, stroke, need for emergency CABG, local  vascular complications etc. and consented for the procedure.  PROCEDURE IN DETAIL:  After obtaining the informed consent, the patient was brought to the cath lab and was placed on fluoroscopy table.  Right groin was prepped and draped in usual fashion.  Xylocaine 1% was used for local anesthesia in the right groin.  With the help of thin-wall needle, a 6-French arterial sheath was placed.  The sheath was aspirated and flushed.  Next, a 6-French left Judkins catheter was advanced over the wire under fluoroscopic guidance up to  the ascending aorta.  Wire was pulled out, the catheter was aspirated and connected to the manifold.  Catheter was further advanced and engaged into the left coronary ostium.  Multiple views of the left system were taken.  Next, the catheter was disengaged and was pulled out over the wire and was replaced with 6-French right Judkins catheter which was advanced over the wire under fluoroscopic guidance up to the ascending aorta.  Wire was pulled out, the catheter was aspirated and connected to the manifold.  Catheter was further advanced and engaged into the right coronary ostium.  Multiple views of the right system were taken.  Next, the catheter was disengaged and was advanced under fluoroscopic guidance up to the left subclavian artery.  This catheter was exchanged over the wire to a LIMA diagnostic catheter which was advanced over the wire and was engaged into LIMA to LAD.  Multiple views of this graft were taken. Next, the catheter was disengaged and was pulled out over the wire and was replaced with right bypass catheter which was advanced over the wire under fluoroscopic guidance up to the ascending aorta.  Wire was pulled out, the catheter was aspirated and connected to the manifold.  This catheter attempted to engage into saphenous vein graft to distal RCA without success.  This catheter was disengaged and was pulled out over the wire and was replaced with multipurpose catheter without success. Next, this catheter was pulled out over the wire and was replaced with a 6-French pigtail catheter which was advanced over the wire under fluoroscopic guidance up to the ascending aorta.  Wire was pulled out, the catheter was aspirated and connected to the manifold.  Catheter was further advanced across the aortic valve into the LV.  LV pressures were recorded.  Next, LV-graphy was done in 30-degree RAO position. Postangiographic pressures were recorded from LV and then pullback pressures  were recorded from the aorta.  There was no gradient across the aortic valve.  Next, aortography was done in 40-degree LAO position which revealed no evidence of aortic aneurysm, but filling of the graft to RCA.  This catheter was pulled out over the wire and was replaced with 6-French right Amplatz catheter which was advanced over the wire under fluoroscopic guidance up to the ascending aorta.  Wire was pulled out, the catheter was aspirated and connected to the manifold.  Catheter was further advanced and engaged without difficulty into the saphenous vein graft to RCA.  Multiple views of this graft were taken.  Next, the catheter was disengaged and was pulled out over the wire.  Sheaths were aspirated and flushed.  FINDINGS:  LV showed good LV systolic function.  EF of 60-65%.  Left main has 30-40% distal stenosis.  LAD has 60-70% proximal and mid diffuse in-stent restenosis and distally this is filling by LIMA. Diagonal 1 is 100% occluded.  Diagonal 2 is very small.  Ramus is small which has mild disease.  Left circumflex  is small which tapers down in AV groove.  OM-1 is moderate size which is patent.  OM-2 is very small. RCA has 50-60% mid stenosis distally.  RCA and PDA are filling by saphenous vein graft.  Saphenous vein graft to diagonal 1 is 100% occluded at the ostium.  LIMA to LAD is patent.  Saphenous vein graft to distal RCA has 30-40% sequential distal stenosis.  PDA and PLV branches had mild disease.  The patient tolerated the procedure well.  There were no complications.  The patient was transferred to recovery room in stable condition.     Allegra Lai. Terrence Dupont, M.D.     MNH/MEDQ  D:  12/05/2010  T:  12/05/2010  Job:  [id number]  cc:   Catheterization Laboratory  Electronically Signed by Charolette Forward M.D. on 12/14/2010 08:34:13 AM

## 2010-12-26 NOTE — Cardiovascular Report (Signed)
NAMESARRIAH, LIME                ACCOUNT NO.:  [id number]   MEDICAL RECORD NO.:  [id number]          PATIENT TYPE:  OIB   LOCATION:  1961                         FACILITY:  [facility]   PHYSICIAN:  Mohan N. Terrence Dupont, M.D. DATE OF BIRTH:  02-28-1941   DATE OF PROCEDURE:  04/14/2009  DATE OF DISCHARGE:  04/14/2009                            CARDIAC CATHETERIZATION   PROCEDURE:  Left cardiac cath with selective left and right coronary  angiography, visualization of saphenous vein graft, left internal  mammary artery graft, left ventriculography, aortography, visualization  of bilateral renal arteries via right groin using Judkins technique.   INDICATIONS FOR PROCEDURE:  [name] is 70 year old white female with  past medical history significant for coronary artery disease, status  post PTCA and stenting to proximal and mid LAD and diagonal 1 in the  past, followed by aggressive restenosis, requiring CABG x3.  In May  2001, she had LIMA to LAD, saphenous vein graft to diagonal 1 and  saphenous vein graft to distal RCA, hypertension, hypercholesteremia,  peripheral vascular disease, status post bilateral common iliac stenting  and left external iliac stenting, history of bronchial asthma, history  of tobacco abuse, GERD, complains of retrosternal chest heaviness  radiating to the left arm associated with nausea, relieved with  sublingual nitro.  The patient also complains of exertional dyspnea with  minimal exertion.  The patient underwent Persantine Myoview, which  showed anteroapical thinning versus minimal focal scarring with no  evidence of reversible ischemia with EF of 78%; however, it was positive  by EKG criteria.  The patient had significant ST depression in  inferolateral leads.  Due to typical anginal chest pain, multiple risk  factors, and positive Persantine stress test by EKG criteria, discuss  with the patient regarding left cath, its risks and benefits, i.e.,  death, MI,  stroke, need for emergency CABG, local vascular  complications, accepted and consented for the procedure.   PAST MEDICAL HISTORY:  As above.   PAST SURGICAL HISTORY:  She had CABG in the past as above, had  hysterectomy in the past, cholecystectomy in the past, appendectomy in  the past, had right cataract surgery in the past.   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  Smoked one and a half pack per day for 37 years, quit in 2004.  No  history of alcohol abuse.   FAMILY HISTORY:  Positive for coronary artery disease.   MEDICATIONS AT HOME:  She is on enteric-coated aspirin 81 mg p.o. daily,  Plavix 75 mg p.o. daily, Cozaar 100 mg p.o. daily, Norvasc 10 mg p.o.  daily, Crestor 10 mg p.o. daily, Advair Diskus 250/50 one puff twice  daily, Nitrostat sublingual p.r.n., Kapidex 60 mg p.o. daily.   PHYSICAL EXAMINATION:  GENERAL:  She was alert and oriented x3 in no  acute distress.  VITAL SIGNS:  Blood pressure was 140/70, pulse was 58 and regular.  HEENT:  Conjunctivae was pink.  NECK:  Supple.  No JVD.  No bruit.  LUNGS:  Clear to auscultation without rhonchi or rales.  CARDIOVASCULAR:  S1 and S2 was normal.  There was soft systolic murmur.  ABDOMEN:  Soft.  Bowel sounds are present, nontender.  EXTREMITIES:  There was no clubbing, cyanosis, or edema.   IMPRESSION:  1. New-onset angina, positive Persantine stress test by EKG criteria.  2. Coronary artery disease, status post coronary artery bypass graft.  3. History of percutaneous coronary intervention to left anterior      descending and diagonal in the past.  4. Hypertension.  5. Hypercholesteremia.  6. Peripheral vascular disease.  7. History of bronchial asthma.  8. History of tobacco abuse.   Discussed with the patient regarding Persantine Myoview result and  various options of treatment, i.e. medical versus invasive, left cath,  its risks and benefits as above and consented for the  procedure.   PROCEDURE:  After obtaining the informed consent, the patient was  brought to the cath lab and was placed on fluoroscopy table.  Right  groin was prepped and draped in usual fashion.  Xylocaine 1% was used  for local anesthesia.  With the help of thin-wall needle, 4-French  arterial sheath was placed.  The sheath was aspirated and flushed.  Next, 4-French left Judkins catheter was advanced over the wire under  fluoroscopic guidance up to the ascending aorta.  Wire was pulled out,  the catheter was aspirated and connected to the manifold.  Catheter was  further advanced and engaged into left coronary ostium.  Multiple views  of the left system were taken.  Next, catheter was disengaged and was  pulled out over the wire and was replaced with 4-French 3D right  diagnostic catheter, which was advanced over the wire under fluoroscopic  guidance into the ascending aorta.  Wire was pulled out, the catheter  was aspirated and connected to the manifold.  Catheter was further  advanced and engaged into right coronary ostium.  Multiple views of the  right system were taken.  Next, catheter was disengaged and was pulled  out over the wire and was replaced with 6-French left bypass catheter,  which was advanced over the wire under fluoroscopic guidance up to the  ascending aorta.  Wire was pulled out, catheter was aspirated and  connected to the manifold.  Catheter was further advanced and engaged  into the saphenous vein graft to diagonal 1.  Catheter was disengaged  and was pulled out over the wire and was placed with saphenous vein  graft to RCA, which was advanced over the wire under fluoroscopic  guidance up to the ascending aorta.  Wire was pulled out, the catheter  was aspirated and connected to the manifold.  Catheter was further  attempted to engage into saphenous vein graft to RCA without success.  Next, this catheter was used to advance the wire into left subclavian.  This  catheter was replaced with LIMA catheter over the wire under  fluoroscopic guidance and was engaged into the LIMA to LAD.  Multiple  views of this graft were taken.  Next, catheter was disengaged and was  pulled out over the wire and was replaced with 4-French Multipurpose  catheter, which was advanced over the wire under fluoroscopic guidance  up to the ascending aorta.  Wire was pulled out, the catheter was  aspirated and connected to the manifold.  Catheter was further advanced  and engaged into the saphenous vein graft to the RCA.  Multiple views of  the graft were taken.  Left catheter was disengaged and was pulled out  over the wire  and was replaced with 4-French pigtail catheter, which was  advanced over the wire under fluoroscopic guidance up to the ascending  aorta.  Wire was pulled out, the catheter was aspirated and connected to  the manifold.  Catheter was further advanced across the aortic valve  into the LV.  LV pressures were recorded.  Next, LV graft was done in 30  degrees RAO position.  Postangiographic pressures were recorded from the  LV and then pullback pressures were recorded from the aorta.  There was  no gradient across the aortic valve.  Next, the pigtail catheter was  pulled down up to the abdominal aorta.  Aortic graft was done in PA  position.  Next, the pigtail catheter was pulled out over the wire,  sheaths were aspirated and flushed.   FINDINGS:  LV showed good LV systolic function, EF of 0000000, left main  has 10-20% distal stenosis, LAD has 70-80% proximal and mid diffuse in-  stent restenosis and then it is 100% occluded filling by LIMA distally.  Diagonal 1 was 100% occluded.  Ramus was very small, which has 20% of  restenosis.  Left circumflex is small, which tapers down in the AV  groove after giving off OM-1.  OM-1 was moderate sized, which is patent.  RCA has 20-30% proximal and 60% mid stenosis distally.  RCA filling by  saphenous vein graft, which  prompted a flow.  Saphenous vein graft to  diagonal 1 is 100% occluded.  Saphenous vein graft to distal RCA is  patent.  LIMA to LAD is patent.  Aortography Showed no aortic aneurysms.  Right renal artery has 20-30% proximal stenosis, left renal artery has  60-70% ostial stenosis.  Bilateral common iliac stents are patent.  Left  external iliac stent is also patent.  The patient tolerated the  procedure well.  There were no complications.  The patient was  transferred to recovery room in stable condition.      Allegra Lai. Terrence Dupont, M.D.  Electronically Signed     MNH/MEDQ  D:  04/14/2009  T:  04/15/2009  Job:  [id number]   cc:   Cath Lab

## 2010-12-26 NOTE — Assessment & Plan Note (Signed)
OFFICE VISIT   MEMPHIS, MENDEN  DOB:  10-24-1940                                       03/16/2010  [id number]   I saw the patient in the office today for continued followup of her  peripheral vascular disease and her carotid disease.  Most recently in  September of 2009 she had had bilateral common iliac artery stents using  kissing balloon technique and also a left external iliac artery stent.  She came in for a routine followup study and also she was due for a  carotid duplex scan.  Of note, she does have some mild claudication  bilaterally but her symptoms have been stable.  The symptoms are brought  on by ambulation and relieved with rest and mostly involve her calves.  They have not changed significantly in the last 6 months.  She has no  history of rest pain and no history of nonhealing ulcers.   With respect to her mild carotid disease she has no history of stroke,  TIAs, expressive or receptive aphasia or amaurosis fugax.   Her past medical history is significant for hypertension,  hypercholesterolemia and coronary artery disease.  She is followed by  Dr. Terrence Dupont.  In addition, she has a history of asthma.  She had a  myocardial infarction in 2001.   SOCIAL HISTORY:  She is married.  She has four children.  She quit  tobacco in 2001.   MEDICATIONS:  1. Advair Diskus 250/50 b.i.d.  2. Nitro tabs p.r.n.  3. Kapidex 60 mg p.o. daily.  4. Crestor 10 mg p.o. daily.  5. Cozaar 100 mg p.o. daily.  6. Amlodipine 10 mg p.o. daily.  7. Plavix 75 mg p.o. daily.  8. Prednisolone 1 drop every other day.   ALLERGIES:  No known drug allergies.   REVIEW OF SYSTEMS:  GENERAL:  She has had no recent weight loss, weight  gain or problems with her appetite.  CARDIOVASCULAR:  She does get some occasional chest tightness but she  states this has not changed in character and has been stable.  She has  had no palpitations or arrhythmias.  She does admit  to dyspnea on  exertion.  She has had no history of DVT or phlebitis.  GI:  She does have a history of reflux, a hiatal hernia and occasional  problems swallowing.  NEUROLOGIC:  She has occasional dizziness and occasional headaches.  PULMONARY:  She has asthma and wheezing at times.  Hematologic, urinary, ENT, musculoskeletal, psychiatric and  integumentary review of systems is unremarkable and is documented on the  medical history form in her chart.   PHYSICAL EXAMINATION:  General:  This is a pleasant 70 year old woman  who appears her stated age.  Vital signs:  Blood pressure is 180/82,  heart rate is 66, respiratory rate is 18.  HEENT:  Unremarkable.  Lungs:  Are clear bilaterally to auscultation without rales, rhonchi or  wheezing.  Cardiovascular:  She has bilateral carotid bruits.  She has a  regular rate and rhythm without murmur appreciated.  She has palpable  femoral pulses and palpable dorsalis pedis pulses bilaterally.  She also  has a left posterior tibial pulse.  She has no evidence of atheroembolic  disease and she has no significant lower extremity swelling.  Abdomen:  Soft and nontender with normal pitched bowel  sounds.  Musculoskeletal:  There are no major deformities or cyanosis.  Neurologic:  She has no  focal weakness or paresthesias.  Skin:  There are no ulcers or rashes.   I did order and independently interpret her carotid duplex scan which  shows that she has developed a greater than 80% left carotid stenosis.  End diastolic velocity is [id number] cm/sec with a peak systolic velocity of  Q000111Q cm/sec suggesting a very tight stenosis.  She has a 40%-59% right  carotid stenosis.  Both vertebral arteries are patent with normally  directed flow.   ABIs today show an ABI of 93% on the right and 99% on the left.  These  are stable.  Her stents are patent.   With respect to her iliac artery disease her stents are patent and she  is not a smoker and she appears to be doing  well from this standpoint.   With respect to her carotid disease the stenosis on the left has  progressed to greater than 80% and for this reason I have recommended  left carotid endarterectomy in order to lower her risk of future stroke.  We have discussed the indications for surgery and the potential  complications including but not limited to bleeding, wound healing  problems, stroke (peri procedural risk 1%-2%), MI, nerve injury or other  unpredictable medical problems.  She has had some occasional chest  tightness and I have asked her to call Dr. Terrence Dupont to see if she needs  any further cardiac workup before scheduling her surgery.  Once she is  cleared from a cardiac standpoint we will need to stop her Plavix 1 week  preoperatively.  She does know to continue taking her aspirin, however.   Hopefully we will be able to schedule her surgery in the near future.  She knows to call if she has any problems or questions.     [name]. Scot Dock, M.D.  Electronically Signed   CSD/MEDQ  D:  03/12/2010  T:  03/17/2010  Job:  [id number]   cc:   Allegra Lai. Terrence Dupont, M.D.

## 2010-12-26 NOTE — Procedures (Signed)
AORTA-ILIAC DUPLEX EVALUATION   INDICATION:  Follow up bilateral iliac stents.   HISTORY:  Diabetes:  No.  Cardiac:  No.  Hypertension:  Yes.  Smoking:  Previous.  Previous Surgery:  Bilateral common iliac and left external iliac artery  stents on 04/26/08.               SINGLE LEVEL ARTERIAL EXAM                              RIGHT                  LEFT  Brachial:                  135                    139  Anterior tibial:           119                    126  Posterior tibial:          134                    142  Peroneal:  Ankle/brachial index:      0.96                   1.02  Previous ABI/date:         02/08/09, 0.90         02/08/09, 0.94    AORTA-ILIAC DUPLEX EXAM  Aorta - Proximal     Not visualized  Aorta - Mid          Not visualized  Aorta - Distal       325 cm/s   RIGHT                                   LEFT  190 cm/s          CIA-PROXIMAL          170 cm/s  122 cm/s          CIA-DISTAL            153 cm/s  Not visualized    HYPOGASTRIC           Not visualized  187 cm/s          EIA-PROXIMAL          87 cm/s  138 cm/s          EIA-MID               115 cm/s  170 cm/s          EIA-DISTAL            184 cm/s   IMPRESSION:  1. Increased velocity of 325 cm/s noted at the distal aorta level.      The proximal-to-mid abdominal aorta was not adequately visualized      due to overlying bowel gas patterns.  2. Patent bilateral common iliac and left external iliac artery stents      with mildly increased velocities noted, as described above.  3. Stable bilateral ankle brachial indices noted.   ___________________________________________  Judeth Cornfield. Scot Dock, M.D.   CH/MEDQ  D:  08/17/2009  T:  08/17/2009  Job:  [id number]

## 2010-12-26 NOTE — Assessment & Plan Note (Signed)
OFFICE VISIT   BREASHIA, BAYARD  DOB:  24-Jul-1941                                       04/12/2010  [id number]   I saw the patient in the office today for followup after her recent left  carotid endarterectomy.  This is a pleasant 70 year old woman who I have  been following with peripheral vascular disease.  She had a left carotid  stenosis which progressed to greater than 80% and left carotid  endarterectomy was recommended in order to lower her risk of future  stroke.  She underwent a left carotid endarterectomy with Dacron patch  angioplasty on 03/28/2010.  She did well postoperatively and was  discharged on postoperative day #1.  She returns for her first  outpatient visit.  Overall she is doing well and has no specific  complaints.  She has had no focal weakness or paresthesias.   PHYSICAL EXAMINATION:  Blood pressure is 159/74, heart rate is 71.  Her  left neck incision has healed nicely.  She has good strength in her  upper extremities and lower extremities bilaterally.   She does have a moderate 40%-59% right carotid stenosis.  I have ordered  a followup carotid duplex scan in 6 months and I will see her back at  that time.  She knows to call sooner if she has problems.  In the  meantime she knows to continue taking her aspirin.     Judeth Cornfield. Scot Dock, M.D.  Electronically Signed   CSD/MEDQ  D:  04/12/2010  T:  04/13/2010  Job:  [id number]   cc:   Allegra Lai. Terrence Dupont, M.D.

## 2010-12-26 NOTE — Procedures (Signed)
AORTA-ILIAC DUPLEX EVALUATION   INDICATION:  Followup bilateral iliac stents and left external iliac  stent.   HISTORY:  Diabetes:  No.  Cardiac:  No.  Hypertension:  Yes.  Smoking:  Previous.  Previous Surgery:  Bilateral common iliac artery and left external iliac  artery stents on 04/26/2008.               SINGLE LEVEL ARTERIAL EXAM                              RIGHT                  LEFT  Brachial:                  163                    162  Anterior tibial:           149                    132  Posterior tibial:          152                    162  Peroneal:  Ankle/brachial index:      0.93                   0.99  Previous ABI/date:         0.96 (08/16/2009)      1.02 (08/16/2009)   AORTA-ILIAC DUPLEX EXAM  Aorta - Proximal     cm/s  Aorta - Mid          158 cm/s  Aorta - Distal       366 cm/s   RIGHT                                   LEFT  186 cm/s          CIA-PROXIMAL          193 cm/s  221 cm/s          CIA-DISTAL            142 cm/s  cm/s              HYPOGASTRIC           cm/s  187 cm/s          EIA-PROXIMAL          162 cm/s  156 cm/s          EIA-MID               176 cm/s  148 cm/s          EIA-DISTAL            150 cm/s   IMPRESSION:  1. Difficult to image proximal aorta due to bowel gas.  2. Increased velocities of 366 cm at distal aorta.  3. Patent bilateral common iliac and left external iliac stents,      stable in comparison to previous study.   ___________________________________________  Judeth Cornfield. Scot Dock, M.D.   CB/MEDQ  D:  03/16/2010  T:  03/17/2010  Job:  [id number]

## 2010-12-26 NOTE — Procedures (Signed)
CAROTID DUPLEX EXAM   INDICATION:  Carotid disease.   HISTORY:  Diabetes:  No.  Cardiac:  No.  Hypertension:  Yes.  Smoking:  Previous.  Previous Surgery:  No carotid surgeries.  CV History:  Currently asymptomatic.  Amaurosis Fugax No, Paresthesias No, Hemiparesis No                                       RIGHT             LEFT  Brachial systolic pressure:         135               139  Brachial Doppler waveforms:         Normal            Normal  Vertebral direction of flow:        Antegrade         Antegrade  DUPLEX VELOCITIES (cm/sec)  CCA peak systolic                   67                68  ECA peak systolic                   94                72  ICA peak systolic                   137               [id number]  ICA end diastolic                   43                60  PLAQUE MORPHOLOGY:                  Mixed             Mixed  PLAQUE AMOUNT:                      Moderate          Moderate / severe  PLAQUE LOCATION:                    ICA / ECA         ICA   IMPRESSION:  1. 40-59% stenosis of the right internal carotid artery.  2. 60-79% stenosis of the left internal carotid artery.    ___________________________________________  Judeth Cornfield. Scot Dock, M.D.   CH/MEDQ  D:  08/17/2009  T:  08/17/2009  Job:  [id number]

## 2010-12-26 NOTE — Procedures (Signed)
CAROTID DUPLEX EXAM   INDICATION:  Follow-up carotid artery disease.   HISTORY:  Diabetes:  No.  Cardiac:  No.  Hypertension:  Yes.  Smoking:  Previous.  Previous Surgery:  No.  CV History:  No.  Amaurosis Fugax No, Paresthesias No, Hemiparesis No                                       RIGHT             LEFT  Brachial systolic pressure:         163               162  Brachial Doppler waveforms:         Triphasic         Triphasic  Vertebral direction of flow:        Antegrade         Antegrade  DUPLEX VELOCITIES (cm/sec)  CCA peak systolic                   104               92  ECA peak systolic                   106               [id number]  ICA peak systolic                   176               [id number]  ICA end diastolic                   52                136  PLAQUE MORPHOLOGY:                  Heterogeneous     Heterogeneous  PLAQUE AMOUNT:                      Moderate          Severe  PLAQUE LOCATION:                    ICA               ICA   IMPRESSION:  1. Right internal carotid artery suggests 40%-59% stenosis.  2. Left internal carotid artery suggests 80%-99% stenosis.  3. Antegrade flow in bilateral vertebrals.   ___________________________________________  Judeth Cornfield. Scot Dock, M.D.   CB/MEDQ  D:  03/16/2010  T:  03/17/2010  Job:  [id number]

## 2010-12-26 NOTE — Discharge Summary (Signed)
NAMELAFERN, RUBLE                ACCOUNT NO.:  [id number]   MEDICAL RECORD NO.:  [id number]          PATIENT TYPE:  INP   LOCATION:  3702                         FACILITY:  [facility]   PHYSICIAN:  Mohan N. Terrence Dupont, M.D. DATE OF BIRTH:  June 08, 1941   DATE OF ADMISSION:  07/27/2007  DATE OF DISCHARGE:  07/29/2007                               DISCHARGE SUMMARY   ADMISSION DIAGNOSES:  1. Chest pain rule out myocardial infarction.  2. Gastroenteritis.  3. Coronary artery disease status post coronary artery bypass graft.  4. Hypertension.  5. Hypercholesteremia.  6. History of bronchial asthma.  7. History of tobacco abuse.   FINAL DIAGNOSES:  1. Stable angina.  Myocardial infarction ruled out.  Negative      Persantine Myoview.  2. Coronary artery disease status post coronary artery bypass graft.  3. Hypertension.  4. Hypercholesteremia.  5. History of bronchial asthma.  6. History of tobacco abuse.  7. Status post gastroenteritis.  8. Status post hypokalemia.   DISCHARGE MEDICATIONS:  1. Enteric-coated aspirin 81 mg 1 tablet daily.  2. Cozaar 100 mg 1 tablet daily.  3. Norvasc 10 mg 1 tablet daily.  4. Nexium 40 mg 1 capsule daily half hour before breakfast.  5. Nitrostat 0.4 mg sublingual use as directed.  6. Advair Discus 250/50 1 puff twice daily.   DIET:  Low-salt, low-cholesterol.  The patient has been advised to avoid  sweets.   ACTIVITY:  As tolerated.   FOLLOW UP:  Follow up with me in 2 weeks.   CONDITION ON DISCHARGE:  Stable.   BRIEF HISTORY/HOSPITAL COURSE:  Ms. Carrier is a 70 year old white female  with past medical history significant for coronary artery disease status  post CABG, hypertension, hypercholesteremia, history of bronchial  asthma, history of tobacco abuse.  She came to the ER complaining of  retrosternal chest pressure as if someone was sitting on her chest off  and on for the last 2 days associated with nausea, vomiting and  diaphoresis lasting  few minutes.  Denies palpitation, lightheadedness or  syncope.  Also complains of vague abdominal pain with associated nausea,  vomiting and diarrhea.  The patient denies any fever or chills.  No  bloody diarrhea.  Denies PND, orthopnea, leg swelling.  Presently denies  chest pain or shortness of breath.   PAST MEDICAL HISTORY:  As above.   PAST SURGICAL HISTORY:  1. She had CABG in the past.  2. Hysterectomy in the past.  3. Cholecystectomy in the past.  4. Status post appendectomy in the past.  5. Status post brain surgery in the past.  6. Eye surgery in the past.   ALLERGIES:  NO KNOWN DRUG ALLERGIES.   HOME MEDICATIONS:  1. She is on enteric-coated aspirin 81 mg p.o. daily.  2. Cozaar 100 mg p.o. daily.  3. Norvasc 10 mg p.o. daily.  4. Nexium 40 mg p.o. daily.  5. Nitrostat sublingual p.r.n.  6. Advair Diskus twice daily.  7. Simvastatin 20 mg 1 tablet daily at night.   SOCIAL HISTORY:  She is married and has 4 children.  Smokes 1-1/2 packs  per day for 30+ years and quit in 2001.  No history of alcohol abuse.  She is retired.  Used to work as a Training and development officer.   FAMILY HISTORY:  Positive for coronary artery disease.   PHYSICAL EXAMINATION:  GENERAL:  She is alert and oriented x 3 in no  acute distress.  VITAL SIGNS:  Blood pressure was 112/43, pulse was 76 regular.  HEENT:  Conjunctivae are pink.  No JVD.  No bruit.  LUNGS:  Decreased breath sounds at bases.  CARDIOVASCULAR:  S1 and S2 normal.  There was a soft systolic murmur.  There was no S3 gallop.  ABDOMEN:  Soft, nontender.  EXTREMITIES:  There is no clubbing, cyanosis or edema.   DIAGNOSTICS:  EKG showed normal sinus rhythm with nonspecific T-wave  changes.   LABORATORY DATA:  Two sets of cardiac enzymes were negative.  Hemoglobin  was 14.6, hematocrit 43.  Repeat electrolytes on admission:  Sodium was  138, potassium 2.9, chloride 107, BUN 9, creatinine 0.69, today pressure  was 3.8, cholesterol was 179, LDL 124,  triglycerides were 70,  homocystine level was 5.4, HDL was 41   BRIEF HOSPITAL COURSE:  The patient was admitted to the telemetry unit.  MI was ruled out by serial enzymes and EKG.  The patient subsequently  underwent Persantine Myoview.  As per Persantine Myoview on July 28, 2007 which showed no evidence of reversible ischemia with EF of 73%.  The patient did not have any episodes of chest pain during the hospital  stay.  Had episode of vomiting and diarrhea, and was started on IV  fluids.  Potassium  was replaced with resolution of abdominal pain and diarrhea.  The  patient has been ambulating in hallway without any problems.  The  patient will be discharged home on above medications and will be  followed up in the office in 1 week.      Allegra Lai. Terrence Dupont, M.D.  Electronically Signed     MNH/MEDQ  D:  07/29/2007  T:  07/30/2007  Job:  [id number]   cc:   Allegra Lai. Terrence Dupont, M.D.

## 2010-12-26 NOTE — Procedures (Signed)
AORTA-ILIAC DUPLEX EVALUATION   INDICATION:  Follow-up evaluation of lower extremity stenting.   HISTORY:  Diabetes:  No.  Cardiac:  No.  Hypertension:  Yes.  Smoking:  Quit.  Previous Surgery:  Bilateral common iliac artery percutaneous  transluminal angioplasty in left external iliac artery PTA on 04/26/2008  by Dr. Scot Dock.               SINGLE LEVEL ARTERIAL EXAM                              RIGHT                  LEFT  Brachial:                  150                    150  Anterior tibial:           130                    129  Posterior tibial:          135                    141  Peroneal:  Ankle/brachial index:      0.90                   0.94  Previous ABI/date: 10/19/2008                       0.88  0.90   AORTA-ILIAC DUPLEX EXAM  Aorta - Proximal     207 cm/s  Aorta - Mid          403 cm/s  Aorta - Distal       498 cm/s   RIGHT                                   LEFT  321 cm/s          CIA-PROXIMAL          357 cm/s  216 cm/s          CIA-DISTAL            208 cm/s  202 cm/s          HYPOGASTRIC           158 cm/s  259 cm/s          EIA-PROXIMAL          210 cm/s  163 cm/s          EIA-MID               170 cm/s  146 cm/s          EIA-DISTAL            156 cm/s   IMPRESSION:  1. Multiple increased velocities noted within stent and native      arteries.  2. Biphasic duplex waveform noted within stent and native arteries.  3. Bilateral lower extremity ABIs suggest mild arterial disease with      biphasic Doppler waveforms.   ___________________________________________  Judeth Cornfield. Scot Dock, M.D.   AC/MEDQ  D:  02/08/2009  T:  02/08/2009  Job:  [id number]

## 2010-12-26 NOTE — Procedures (Signed)
AORTA-ILIAC DUPLEX EVALUATION   INDICATION:  Follow-up evaluation of stenting.   HISTORY:  Diabetes:  No.  Cardiac:  No.  Hypertension:  Yes.  Smoking:  Quit.  Previous Surgery:  Bilateral common iliac artery percutaneous  transluminal angioplasty and left external iliac artery PTA on 04/26/08  by Dr. Scot Dock.               SINGLE LEVEL ARTERIAL EXAM                              RIGHT                  LEFT  Brachial:                  145                    154  Anterior tibial:           130                    126  Posterior tibial:          135                    130  Peroneal:  Ankle/brachial index:      0.88                   0.90  Previous ABI/date:   AORTA-ILIAC DUPLEX EXAM  Aorta - Proximal     222 cm/s  Aorta - Mid          362 cm/s  Aorta - Distal       414 cm/s   RIGHT                                   LEFT  201 cm/s          CIA-PROXIMAL          333 cm/s  165 cm/s          CIA-DISTAL            192 cm/s  174 cm/s          HYPOGASTRIC           182 cm/s  187 cm/s          EIA-PROXIMAL          202 cm/s                    EIA-MID               177 cm/s                    EIA-DISTAL            177 cm/s   IMPRESSION:  1. Multiple increased velocities noted in the native arteries and      within stent.  2. Bilateral lower extremity ankle brachial indices suggest mild      arterial disease with biphasic Doppler waveforms.   ___________________________________________  Judeth Cornfield. Scot Dock, M.D.   AC/MEDQ  D:  10/19/2008  T:  10/19/2008  Job:  [id number]

## 2010-12-26 NOTE — Assessment & Plan Note (Signed)
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  She has moderate aortic stenosis and also  had a stenosis right at the bifurcation and underwent kissing balloons  and stenting of the common iliac arteries in September of 2009.  Her  initial followup ABIs were 98% on the right and 96% on the left.  She  comes in for routine followup visit.  She has had no real claudication  or rest pain.  She denies any history of nonhealing wounds.  She has had  some occasional pain in the left groin.  She quit smoking a year ago.   REVIEW OF SYSTEMS:  She has had no recent chest pain, chest pressure,  palpitations or arrhythmias.  She has had no productive cough,  bronchitis, asthma or wheezing.   PHYSICAL EXAMINATION:  This is a pleasant 70 year old woman who appears  her stated age.  Her blood pressure is 158/67, heart rate is 62.  Neck  is supple.  I do not detect any carotid bruits.  Lungs are clear  bilaterally to auscultation.  On cardiac exam she has a regular rate and  rhythm.  Her abdomen is soft and nontender.  She has palpable femoral  pulses and palpable pedal pulses bilaterally.  There is no evidence of  aneurysm in the left groin and no bruit noted in the left groin.   Duplex scan shows that her stents are patent although she does have some  elevated velocities on the left to 333 cm/second.  Within the aorta  itself there are two areas of increased velocity of 362 and 414  cm/second.  There are mildly elevated velocities on the right.  Her ABIs  in the office today were 88% on the right and 90% on the left.   Given that her ABIs have dropped only slightly and she has palpable  pulses I think it would be reasonable to follow up her mildly elevated  velocities on the right and moderately  elevated velocities on the left  with a followup study in 3 months.  If these continue to progress then I  think she will need arteriography and possible reintervention.  She is  on Plavix and she has quit smoking.  Currently she is asymptomatic.   I will see her back in 3 months.  She knows to call sooner if she has  problems.   Judeth Cornfield. Scot Dock, M.D.  Electronically Signed   CSD/MEDQ  D:  10/19/2008  T:  10/20/2008  Job:  [id number]

## 2010-12-26 NOTE — Assessment & Plan Note (Signed)
OFFICE VISIT   [name], [name]  DOB:  1941/08/11                                       02/08/2009  [id number]   I saw the patient in the office today for continued followup of her  peripheral vascular disease.  She had presented with rest pain in the  left lower extremity and evidence of significant iliac artery occlusive  disease.  In September of last year she underwent bilateral common iliac  artery PTA and stenting using a kissing balloon technique and also PTA  and stenting of a left external iliac artery stenosis and had an  excellent result.  On followup duplex scan she has had some areas of  increased velocity in the common iliac arteries and also the external  iliac arteries bilaterally.  She also has elevated velocities in the  aorta above her stents.  Since I saw her last she has some mild  claudication in both lower extremities that involves the calf.  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  There is no history of rest pain  and no history of nonhealing ulcers.   SOCIAL HISTORY:  She quit tobacco 8 years ago.   REVIEW OF SYSTEMS:  She has had no recent chest pain, chest pressure,  palpitations or arrhythmias.  She has had no productive cough,  bronchitis, asthma or wheezing.  Review of systems is otherwise  unremarkable.   PHYSICAL EXAMINATION:  This is a pleasant 70 year old woman who appears  her stated age.  Her blood pressure is 152/67, heart rate is 60.  She  has a soft right carotid bruit.  Lungs are clear bilaterally to  auscultation.  On cardiac exam she has a regular rate and rhythm.  Palpable femoral pulses and palpable dorsalis pedis pulses bilaterally.  She has no evidence of atheroembolic disease.  Both feet are warm and  well-perfused.   Duplex scan shows elevated velocities in the mid and distal aorta and  also in bilateral common iliac arteries and to a lesser extent the  external iliac arteries.  However, she  has biphasic Doppler signals in  both feet with an ABI of 90% on the right and 94% on the left.   Given that she has palpable pulses and biphasic signals in both feet at  this point I do not think it is necessary to restudy her and consider  reintervening if she has developed some recurrent stenosis in her  stented areas or proximal or distal to these.  I have encouraged her to  stay as active as possible and I plan on seeing her back in 6 months so  we can continue to follow this.  If her symptoms progress or if she has  a significant drop in her ABIs I think it would be worth restudying her.  She did have fairly diffuse aortoiliac disease.   In addition when she returns in 6 months we will obtain a carotid duplex  scan as it has been many years since she had her carotid arteries  checked.  On her most recent duplex she had only mild carotid disease  bilaterally but this was several years ago.  She does know to continue  taking her Plavix.  We will see her back in 6 months.  She knows to call  sooner if she has problems.  Judeth Cornfield. Scot Dock, M.D.  Electronically Signed   CSD/MEDQ  D:  02/08/2009  T:  02/09/2009  Job:  [id number]

## 2010-12-26 NOTE — Assessment & Plan Note (Signed)
OFFICE VISIT   CIERRA, JOHAL  DOB:  1941/04/09                                       04/06/2008  [id number]   I saw the patient in the office today for continued followup of her  peripheral vascular disease.  This is a pleasant 70 year old woman whom  I have been following with known peripheral vascular disease.  I last  saw her in August of 2007 at which time she an ABI of 82% on the right  and 70% on the left.  Her symptoms were quite stable and we elected not  to proceed with arteriography unless her symptoms progressed.  She  returns now with increasing claudication symptoms in the left leg.  She  has pain in her hip, thigh, and calf associated with ambulation and  relieved with rest on the left side.  This occurs at less than 1 block.  She has had really minimal symptoms on the right side.  She denies any  history of rest pain and has had no history of nonhealing wounds.  She  has had some paresthesias in the left foot.  Of note, she also complains  of significant pain in her entire left leg at night which again involves  her hip, thigh and calf.   REVIEW OF SYSTEMS:  She has had no recent chest pain, chest pressure,  palpitations or arrhythmias.  She does admit to dyspnea on exertion.   PHYSICAL EXAMINATION:  Blood pressure 148/72, heart rate is 66.  She has  a right carotid bruit.  Lungs are clear bilaterally to auscultation.  Cardiac exam, she has a regular rate and rhythm.  She has palpable right  femoral pulse and slightly diminished left femoral pulse.  She has  palpable dorsalis pedis pulse bilaterally but again diminished on the  left side.  She has no ischemic ulcers on her feet.  She has no  significant lower extremity swelling.   Doppler study in our office shows an ABI of 81% on the right and 66% on  the left.  This was based on a Doppler study done at [facility].   Given the progression of her symptoms in her left leg, I  have recommend  we proceed with arteriography to see what options she has for  revascularization.  If she has a proximal iliac stenosis, this could  potentially be addressed with angioplasty.  I have explained, however,  that I am not sure that all of her symptoms could be related to her  peripheral vascular disease as the pain she is having in her entire left  leg at rest does not really fit with that from peripheral vascular  disease.  She could also have some lumbar disk disease and her  peripheral vascular disease and disk disease could be contributing to  her symptoms.  However, I think arteriogram will also help sort this  out.  We have discussed the indications for arteriography and the  potential complications including but not limited to bleeding, arterial  injury, and kidney failure.  In addition, we have discussed balloon  angioplasty and the  potential complications including but not limited  to bleeding, arterial thrombosis or arterial injury.  All of her  questions were answered and she is agreeable to proceed.  Her procedure  has been scheduled for September 14.  Will  make further recommendations  pending results.   Judeth Cornfield. Scot Dock, M.D.  Electronically Signed   CSD/MEDQ  D:  04/06/2008  T:  04/07/2008  Job:  [id number]

## 2010-12-26 NOTE — Assessment & Plan Note (Signed)
OFFICE VISIT   RISHA, LORENZEN  DOB:  01/22/1941                                       05/11/2008  [id number]   I saw the patient in the office today for followup after her recent  iliac angioplasty and stenting.  I had seen her in the office on  04/06/2008 and she was having progression of her lower extremity  symptoms.  She was having claudication in the left leg involving the  hip, thigh and calf associated with ambulation and relieved with rest.  This occurred at less than 1 block.  She was having minimal symptoms on  the right side.  She was also having paresthesias in the left foot.  Given the progress in her symptoms.  I have recommend we proceed with  arteriography.  She underwent an aortogram with runoff and bilateral  common iliac artery PTA and stents using the kissing balloon technique  also PTA and stent of the left external iliac artery.  She returns for  our first followup visit.  She states her claudication symptoms have  resolved.  She has had no specific complaints, except for some  tenderness in the left groin.   EXAMINATION:  Blood pressure 149/81, heart rate is 69.  Both groins  looks fine without evidence of hematoma or swelling or ecchymosis.  She  has palpable femoral pulses and palpable pedal pulses bilaterally.   Doppler study in our office today shows an ABI 98% on the right and 96%  on the left.  She has biphasic wave forms bilaterally.  Overall I am  pleased with her progress.  I think her stents are open, and I have  recommend that she stay on Plavix for at least a year to help maintain  patency of her stents.  I plan on seeing her back in 6 months.  She  knows to call sooner if she has problems.   Judeth Cornfield. Scot Dock, M.D.  Electronically Signed   CSD/MEDQ  D:  05/11/2008  T:  05/12/2008  Job:  [id number]   cc:   Allegra Lai. Terrence Dupont, M.D.

## 2010-12-26 NOTE — Op Note (Signed)
NAMEALEXISS, [name]                ACCOUNT NO.:  [id number]   MEDICAL RECORD NO.:  [id number]          PATIENT TYPE:  AMB   LOCATION:  SDS                          FACILITY:  [facility]   PHYSICIAN:  Judeth Cornfield. Scot Dock, M.D.DATE OF BIRTH:  [birth year]   DATE OF PROCEDURE:  04/26/2008  DATE OF DISCHARGE:                               OPERATIVE REPORT   PREOPERATIVE DIAGNOSIS:  Rest pain, left lower extremity with iliac  artery occlusive disease.   POSTOPERATIVE DIAGNOSIS:  Rest pain, left lower extremity with iliac  artery occlusive disease.   PROCEDURES:  1. Ultrasound-guided access to the left common femoral artery.  2. Ultrasound-guided access to the right common femoral artery.  3. Minimal bilateral lower extremity runoff.  4. Bilateral common iliac artery percutaneous transluminal angioplasty      and stent using the kissing balloon technique (Genesis 7 x 24 stent      bilaterally).  5. Left external iliac artery percutaneous transluminal angioplasty      and stent with Genesis 7 x 24 stent and then post-dilatation with 8      mm x 2 cm balloon in the external iliac artery stent.   TECHNIQUE:  The patient was taken to the PV lab and sedated with 1 mg of  Versed and 50 mcg of fentanyl.  Both groins were prepped and draped in  the usual sterile fashion.  Under ultrasound guidance, the right common  femoral artery was cannulated and guidewire was introduced into the  infrarenal aorta.  A 5-French sheath was introduced over the wire.  A  pigtail catheter was positioned at the L1 vertebral body and flushed.  Aortogram was obtained.  Catheter was then repositioned above the aortic  bifurcation and oblique iliac projections were obtained.  Bilateral  lower extremity runoff films were obtained.  Next, there was a stenosis  at the aortic bifurcation.  There was approximately 40% narrowing in the  proximal right common iliac artery and 85% narrowing in the left common  iliac artery.  I  elected to address this with a kissing balloon  technique.  A 5-French sheath was exchanged first for a long 6-French  sheath on the right side.  Next, under ultrasound guidance and after the  skin was anesthetized, the left common femoral artery was cannulated and  guidewire was introduced into the infrarenal aorta.  A 5-French sheath  initially was placed and this was exchanged for a long 6-French sheath.  The patient was then heparinized with 3000 units of IV heparin.  The  sheaths were advanced through the stenosis of the bifurcation.  A  Genesis 7 x 24 balloons and stents were positioned across the  bifurcation using a kissing balloon technique.  The sheaths were then  retracted and the stents were deployed at 8 atmospheres for 40 seconds.  The balloons were then removed.  Followup arteriogram showed an  excellent result.  There was a 70% stenosis at the distal left common  iliac artery, extending into the external iliac artery.  The right  hypogastric artery was patent.  I elected to stent this  with a 7 x 24  Genesis stent.  The stent was positioned using a glowing trail marker  and then the stent was deployed at 8 atmospheres for 40 seconds.  There  appeared to be some mild residual stenosis within the proximal left  common iliac artery stent, so I did a poststenotic dilatation with an 8  x 2 balloon within the left common iliac artery stent.  I protected the  right  common iliac artery stent with 7 x 2 balloon.  At the completion, the  completion films obtained which showed an excellent result.  The patient  tolerated the procedure well and was transferred to the holding area in  satisfactory condition.      Judeth Cornfield. Scot Dock, M.D.  Electronically Signed     CSD/MEDQ  D:  04/26/2008  T:  04/27/2008  Job:  [id number]

## 2010-12-29 NOTE — Procedures (Signed)
[place]  Patient:    [name], [name]                       MRN: [id number] Proc. Date: 05/23/00 Adm. Date:  [id number] Attending:  Sherrin Daisy CC:         Allegra Lai. Terrence Dupont, M.D.  Jill Alexanders, M.D.  Domingo Pulse, M.D.   Procedure Report  PROCEDURE PERFORMED:  Colonoscopy.  MEDICATIONS:  Fentanyl 62.6 mcg, Versed 6 mg IV.  INDICATION:  Rectal bleeding and positive stools and a history of left lower quadrant pain with a diagnosis of diverticulitis being made clinically.  The patient did improve with antibiotics and given the rectal bleeding a colonoscopy is to be performed.  DESCRIPTION OF PROCEDURE:  The procedure had been explained to the patient and consent obtained.  With the patient in the left lateral decubitus position, digital exam was performed.  The Olympus pediatric video colonoscope was inserted and advanced under direct visualization.  The prep was quite good. We were able to advance around to the cecum without difficulty.  The ileocecal valve and appendiceal orifice were seen.  The scope was withdrawn to the cecum.  The ascending colon, hepatic flexure, transverse colon, splenic flexure, descending and sigmoid colon were seen well upon removal.  No polyps were seen. There were scattered diverticula but this was really quite unimpressive.  The rectum was seen well and was free of lesions. The patient tolerated the procedure well and was maintained on low flow oxygen and pulse oximetry throughout the procedure with no obvious problem.  ASSESSMENT: 1. Hemoccult positive stool probably due to hemorrhoids or possibly    diverticulitis.  No neoplasia seen. 2. Minimal diverticulosis.  PLAN:  Will give a sheet about diverticular disease.  Keep the patient on fiber and she her back in the office in six weeks. DD:  05/23/00 TD:  05/24/00 Job: [id number]

## 2010-12-29 NOTE — Discharge Summary (Signed)
NAME:  [name], [name]                          ACCOUNT NO.:  [id number]   MEDICAL RECORD NO.:  [id number]                   PATIENT TYPE:  INP   LOCATION:  2006                                 FACILITY:  [facility]   PHYSICIAN:  Allegra Lai. Terrence Dupont, M.D.              DATE OF BIRTH:  [birth year]   DATE OF ADMISSION:  07/26/2003  DATE OF DISCHARGE:  07/27/2003                                 DISCHARGE SUMMARY   ADMISSION DIAGNOSES:  1. Recurrent chest pain, rule out coronary insufficiency, coronary artery     disease, status post coronary artery bypass graft.  2. Hypertension.  3. Hypercholesterolemia.  4. Chronic obstructive pulmonary disease.  5. History of tobacco abuse.   DISCHARGE DIAGNOSES:  1. Stable angina, negative Persantine Cardiolite.  2. Coronary artery disease, status post coronary artery bypass graft.  3. Hypertension.  4. Hypercholesterolemia.  5. Chronic obstructive pulmonary disease.  6. Tobacco abuse.   DISCHARGE MEDICATIONS:  1. Norvasc 5 mg 1 tablet daily.  2. Nitro-Dur 0.4 mg per hour, apply to the chest wall in the a.m., off at     night.  3. Baby aspirin 81 mg 2 p.o. q.d.  4. Nexium 40 mg 1 tab twice daily.  5. Zocor 40 mg 1 tablet daily at night.  6. Nitrostat 0.4 mg sublingual to use as directed.  7. Advair 250/50 1 inhalation twice daily as before.   ACTIVITY:  As tolerated.   DIET:  Low salt, low cholesterol.   FOLLOW UP:  With me in two weeks.   CONDITION ON DISCHARGE:  Stable.   BRIEF HISTORY/HOSPITAL COURSE:  Ms. Dimmock is a 70 year old white female with  a past medical history significant for coronary artery disease, status post  coronary artery bypass grafting, hypertension, hypercholesterolemia, COPD,  history of tobacco abuse, history of peptic ulcer disease, history of  interstitial cystitis, history of Arnold-Chiari malformation, and also  family history of coronary artery disease.  She came to the office  complaining of right-sided chest  pain radiating forward and radiating to the  right arm associated with nausea.  She took one sublingual nitro with relief  and again had similar chest pain in my office, relieved with rest.  Patient  states she has been having right-sided chest pain off and on, mild in  nature, but the rest pain was severe and went into her right arm, so she  decided to come to the office.  Denies any nausea.  Denies any vomiting or  diaphoresis.  Denies any palpitations, lightheadedness, or syncope.  Denies  shortness of breath.  Denies the relation of chest pain to breathing, food,  or movement.  Denies any cough, fevers or chills.   PAST MEDICAL HISTORY:  As above.   PAST SURGICAL HISTORY:  1. She had a CABG.  2. Status post cholecystectomy.  3. Status post right cataract surgery.   SOCIAL HISTORY:  She is  married.  Retired.  Worked as a Training and development officer.  Smoked 1-1/2  packs per day for 37 years.  Quit after PTCA and stenting in the past.  No  history of alcohol abuse.   FAMILY HISTORY:  Positive for coronary artery disease.   PHYSICAL EXAMINATION:  GENERAL:  Alert and oriented x 3.  No acute distress.  VITAL SIGNS:  Blood pressure 160/80, pulse 57 and regular.  HEENT:  Conjunctivae is pink.  NECK:  Supple.  No JVD.  No bruits.  LUNGS:  Clear to auscultation without rales or rhonchi.  CARDIOVASCULAR:  S1 AND S2 is normal.  There is no S3 gallop.  ABDOMEN:  Soft.  Bowel sounds are present.  She had mild epigastric  tenderness.  There was no guarding.  EXTREMITIES:  No clubbing, cyanosis or edema.   EKG showed normal sinus bradycardia with nonspecific ST/T wave changes.   LABS:  Hemoglobin 11.9, hematocrit 35.3, white count 8.5.  Sodium 143,  potassium 3.8, chloride 109, bicarb 28.  Blood sugar was 135.  Repeat  fasting blood sugar is pending.  BUN is 10, creatinine 0.9.  Her two sets of  cardiac enzymes were negative.  Lipid panel is still pending.  Two sets of  troponin I were also negative.   BRIEF  HOSPITAL COURSE:  Patient was admitted to the telemetry unit.  An MI  was ruled out by serial enzymes and EKG.  Patient did not have any further  episodes of chest pain during the hospital stay.  Patient had Persantine  Cardiolite today that showed no evidence of ischemia or infarct with an EF  of 73%.  Patient will be discharged home on the above medications.  Will  follow up with a fasting blood sugar as outpatient and her lipid panel.                                                Allegra Lai. Terrence Dupont, M.D.    MNH/MEDQ  D:  07/27/2003  T:  07/28/2003  Job:  [id number]

## 2010-12-29 NOTE — Discharge Summary (Signed)
[name], [name] NO.:  [id number]   MEDICAL RECORD NO.:  [id number]          PATIENT TYPE:  INP   LOCATION:  [id number]                         FACILITY:  [facility]   PHYSICIAN:  Natalie Faster, MD        DATE OF BIRTH:  [birth year]   DATE OF ADMISSION:  09/28/2006  DATE OF DISCHARGE:  10/02/2006                               DISCHARGE SUMMARY   PRIMARY CARE PHYSICIAN:  Dr. Anastasio Mccullough.   DISCHARGE DIAGNOSES:  1. Left lower lobe pneumonia.  2. Asthma exacerbation.  3. Hypertension.  4. History of coronary artery disease.   PROCEDURES DONE IN THE HOSPITAL:  She had an x-ray of chest done 16th of  February, 2008, which showed left lower lobe pneumonia with small  effusion.   HISTORY OF PRESENT ILLNESS:  For a full history and physical, see the  history and physical dictated by Dr. Wendi Mccullough on September 28, 2006.  In  short, [name] is a 70 year old lady who has a history of asthma and  hypertension who comes in with complaints of cough, fever and chills.  She was evaluated in the emergency room and was found to have a left  lower lobe pneumonia with leukocytosis.  She was also wheezing.  She was  admitted to the floor with IV antibiotics, Rocephin and Zithromax, and  was also put on aerosol treatments.   PROBLEM LIST:  1. Left lower lobe pneumonia.  She was started on Rocephin and      Zithromax with symptomatic improvement and then she was changed to      Avalox.  I would continue her antibiotics for 5 more days for a      total of 10 days of antibiotics.  2. Asthma exacerbation.  She was wheezing bilaterally while she was in      the hospital.  She was initially started on prednisone, but her      wheezes did not improve.  She was then changed over to Solu-Medrol      and now she is back on prednisone and she is not wheezing at this      point of time.  She will be discharged home with a slow taper of      prednisone.   DISCHARGE MEDICATIONS:  1. Avalox 400 mg once  daily for 5 more days.  2. Prednisone 40 mg once daily for 3 days and then 30 mg daily for 3      days and then 20 mg once daily for 3 days and then 10 mg once daily      for 3 days and stop.  3. Aspirin 81 mg once daily.  4. Cozaar 100 mg once daily.  5. Nexium 40 mg once daily.  6. Norvasc 5 mg once daily.  7. Zocor 80 mg once daily.   DISPOSITION:  She is now being discharged home in stable condition.   FOLLOWUP:  She will follow up with Dr. Mellody Mccullough in 2 weeks.      Natalie Faster, MD  Electronically Signed     PKN/MEDQ  D:  10/02/2006  T:  10/03/2006  Job:  [id number]

## 2010-12-29 NOTE — Discharge Summary (Signed)
[place]  Patient:    [name], [name]                       MRN: [id number] Adm. Date:  [id number] Disc. Date: 01/08/00 Attending:  Darylene Price Dictator:   Earnstine Regal, P.A. CC:         Allegra Lai. Terrence Dupont, M.D.                           Discharge Summary  DATE OF BIRTH:  Jun 17, 1941  ADMISSION DIAGNOSIS: 1. Recurrent chest pain status post percutaneous transluminal coronary    angioplasty with stent of the left anterior descending and first diagonal    January 2001. 2. Hypertension. 3. Hypercholesterolemia. 4. Ongoing tobacco use and chronic obstructive pulmonary disease. 5. History of peptic ulcer disease. 6. History of interstitial cystitis.  DISCHARGE DIAGNOSES: 1. Recurrent chest pain status post percutaneous transluminal coronary    angioplasty with stent of the left anterior descending and first diagonal    January 2001. 2. Hypertension. 3. Hypercholesterolemia. 4. Ongoing tobacco use and chronic obstructive pulmonary disease. 5. History of peptic ulcer disease. 6. History of interstitial cystitis. 7. Postoperative anemia. 8. Postoperative bradycardia.  PROCEDURES: 1. Myocardial perfusion scan on Dec 31, 1999. 2. Cardiac catheterization on Jan 01, 2000. 3. Coronary artery bypass grafting times three, left internal mammary artery    to the left anterior descending, saphenous vein graft to the right coronary    artery, saphenous vein graft to the diagonal on Jan 02, 2000 by    Dr. Roxy Manns.  BRIEF HISTORY:  The patient is a 70 year old white female with past medical history significant for coronary artery disease.  The patient is status post PTCA and stent in the LAD and first diagonal in January 2001.  She has a history of hypertension, hypercholesterolemia, tobacco use and continues to smoke.  History of peptic ulcer disease.  History of interstitis and history of Arnold Chiari malformation, positive family history for  coronary artery disease.  She came to the ER from cardiac rehab complaining of some retrosternal chest heaviness associated with what she describes as "someone sitting over her chest." It radiated to her neck and her back associated with nausea and vomiting.  Pain level was found to be a 6/10.  She received two sublingual nitroglycerin with relief of her chest pain. She denied palpitations, light headedness or syncope. She denied PND or orthopnea or leg swelling.  The patient states she occasionally has chest pain but has not used nitroglycerin since one month prior to admission.  She denies any fever, chills or cough.  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  She smoked 1-1/2 packs a day for 37 years.  She quit three months ago after her PTCA and restenting and restarted one month after that.  She has no history of alcohol abuse.  MEDICATIONS ON ADMISSION: Toprol 50 mg 1/2 tablet p.o. q.d.  Norvasc 5 mg p.o. q.d. Nitro-Dur patches 0.4 q.a.m. and off at night.  Zocor 40 mg q.d. and enteric coated aspirin one q.d. and Prevacid 30 mg p.o. b.i.d.  For further history and physical please see the dictated note.  HOSPITAL COURSE:  The patient was admitted and anticoagulated.  CPK MBs were negative and the patient underwent a myocardial perfusion scan.  This showed inducible ischemia in the LAD distribution in the anterior lateral wall and derived left ventricular ejection  fraction was 61% with mild septal hypokinesis.  Based on this, the patient was then taken to the cardiac catheterization laboratory by Dr. Terrence Dupont.  This showed good LV function.  The left main was patent.  The LAD had an 85% long tubular restenosis and tortuosity of the bifurcation with D1.   The first diagonal had a 90% ostial stenosis.  A small patent OM1 and OM2. The RCA has a 70% stenosis.  The patient tolerated the procedure well.  After completion of the study, Dr. Terrence Dupont recommended coronary artery bypass grafting. The  patient was seen in consultation by Dr. Darylene Price.  After reviewing the studies, he agreed the patient had significant two vessel coronary artery disease, restenosis and status post PTCA of the stent placement with a normal LV function and multiple comorbidities. The patient also had peripheral vascular disease with claudication and a question of aortic occlusive disease. He recommended coronary artery bypass grafting.  The risks and benefits were discussed with the patient in detail and informed operative consent was obtained. Preoperative studies included ankle brachial indices that showed an index of 0.88 on the right, 0.74 on the left.  Carotids showed mild to moderate heterogenous plaque in the bifurcation and ICA.  There was no evidence of significant ICA stenosis.  The patient remained hemodynamically stable and was then taken to the operating room on Jan 02, 2000 at which time, she underwent coronary artery bypass grafting with the left internal mammary to the LAD, saphenous vein graft to the right coronary artery and saphenous vein graft to the diagonal.  She came off cardiopulmonary bypass in sinus rhythm with no inotropic agents and was transferred to the SICU.  She remained sedated overnight on the ventilator.  On the first postoperative morning she was in sinus rhythm. She was atrially paced at 90.  Her weight was up 12 pounds. Chest x-ray was clear.  She was neurologically intact. O2 saturations were good on a 1 liter nasal cannula.  There was some mild inferior EKG changes. The patient was stable on low dose beta blockers.  The patient continued to make good progress.  Pleural tubes were removed on Jan 03, 2000.  On Jan 04, 2000 she was stable postoperative. She was started on some renal dose dopamine due to low blood pressures.  She had a good cardiac output and was thought to be secondary to vasodilation postoperative. She was weaned off the dopamine. She was continued  on diuretics.  Chest tube was discontinued. CPKs were negative.  The patient continued to do well and was transferred to the floor  on the third postoperative day.  She was hemodynamically stable.  Hematocrit was 24.  Platelets 152,000.  White count was 10.9, potassium 3.6.  The fourth postoperative day, hemoglobin 8.6 and hematocrit 24.  Electrolytes were normal.  BUN 10. Creatinine 0.7.  Glucose 108.  She had a few rales in her bases.  The sternum was stable.  Wounds were okay.  She was getting up easily. We continued the diuresis.  She was still at 172 up from 164 preoperative. The fifth postoperative day she continued to show good progress. Wounds were healing nicely. She was ambulating without difficulty.  She remained in sinus rhythm and it was Dr. Magdalen Spatz opinion that she would be ready for discharge in the a.m. on Jan 08, 2000.  DISCHARGE MEDICATIONS: Coated aspirin 325 mg one q.d. Darvocet N 100 one to two p.o. q.4-6h. p.r.n.  Toprol XL 50 mg 1/2 tablet  q.d. Potassium 20 mEq one q.d.  Lasix 40 mg one q.d. Niferex 150 mg q.d. after supper. Prevacid 30 mg before meals in the a.m. and p.m.  Multivitamin with iron one q.d.  DISCHARGE ACTIVITY:  No lifting over 10 pounds.  No driving.  No strenuous activity.  DIET: We are going to send her home on no restrictions on her diet for now. Go back to low salt, low fat when she is recovered.  WOUND CARE: She is to clean her wounds with plain soap and water.  FOLLOW-UP: She is to return to see Dr. Terrence Dupont in two weeks. She will need to make that appointment. Dr. Roxy Manns will have her return in three weeks with a follow up and a chest x-ray from the [facility].  DISCHARGE LABORATORY DATA:  As noted above, electrolytes are normal.  BUN 10, creatinine 0.7. Calcium 8.5. White count 9.5.  Hemoglobin 8.6, hematocrit 24. Platelets are 225,000.  CONDITION ON DISCHARGE:  Improving. DD:  01/07/00 TD:  01/07/00 Job: [id number]

## 2010-12-29 NOTE — Op Note (Signed)
[place]. [facility]  Patient:    [name], [name]                       MRN: [id number] Proc. Date: 01/02/00 Adm. Date:  [id number] Attending:  Darylene Price CC:         Allegra Lai. Terrence Dupont, M.D.             Harlene Ramus, M.D., [place] office                           Operative Report  PREOPERATIVE DIAGNOSIS:  Severe two-vessel coronary artery disease with class IV unstable angina, restenosis status post percutaneous transluminal coronary angioplasty and stent placement.  POSTOPERATIVE DIAGNOSIS:  Severe two-vessel coronary artery disease with class IV unstable angina, restenosis status post percutaneous transluminal coronary angioplasty and stent placement.  OPERATION:  Mediastinotomy for coronary artery bypass grafting x 3 (left internal mammary artery to distal left anterior descending coronary artery, saphenous vein graft to first diagonal branch, saphenous vein graft to distal right coronary artery).  SURGEON:  Valentina Gu. Roxy Manns, M.D.  ASSISTANT:  Shelle Iron, P.A.  ANESTHESIA:   General.  BRIEF CLINICAL NOTE:  The patient is a 70 year old, obese, white female from [place] followed by Dr. Harlene Ramus at the [facility] and referred by Dr. Charolette Forward for management of coronary artery disease.  [name] has history of coronary artery disease status post acute myocardial infarction in November 2000.  She ultimately underwent angioplasty and stent placement in her mid left anterior descending coronary artery on August 29, 1999, by Dr. Terrence Dupont.  She now returns with two to three-month history of progressive symptoms of angina and a four-day history of severe Class IV unstable angina.  She was admitted to the hospital and ruled out for acute myocardial infarction.  She subsequently underwent stress Cardiolite exam which was markedly positive for ischemic changes.  She also has history  of hypertension, hypercholesterolemia, and severe tobacco abuse.  She has a strong family history of coronary artery disease.  [name] underwent cardiac catheterization by Dr. Terrence Dupont which demonstrates restenosis in the left anterior descending coronary artery involving the takeoff of the first diagonal branch.  There was also 70% stenosis of the right coronary artery.  There as insignificant disease in the left circumflex system.  Left ventricular function appears preserved.  OPERATIVE CONSENT:  The patient is counselled at length regarding the indications and potential benefits of coronary artery bypass grafting.  She understands the associated risks of surgery including but not limited to risk of death, stroke, myocardial infarction, congestive heart failure, bleeding requiring blood transfusion, arrhythmia, infection, and recurrent coronary artery disease.  She accepts these risks as well as any unforeseen complications and agrees to proceed with surgery as described.  OPERATIVE NOTE IN DETAIL:  The patient was brought to the operating room on the above-mentioned date, and invasive hemodynamic monitoring was established by the anesthesia service under the care and direction of Dr. Fulton Reek.  The patient was placed in the supine position on the operating table.  Following induction of general endotracheal anesthesia, the patients chest, abdomen, both groins, and both lower extremities are prepared and draped in a sterile manner.  A median sternotomy incision is performed and the left internal mammary artery, is dissected from the chest wall and  prepared for bypass grafting. The left internal mammary artery is noted to be good quality conduit for bypass grafting.  Simultaneously, saphenous vein is obtained from the patients right thigh through a series of longitudinal incisions.  The saphenous vein is noted to be good quality conduit for bypass grafting.  The patient is  heparinized systemically.  The pericardium is opened.  The ascending aorta is inspected and is notably free of any palpable plaques or calcifications.  The ascending aorta and the right atrium are cannulated for cardiopulmonary bypass.  Adequate heparinization is verified.  Cardiopulmonary bypass is begun, and the surface of the heart is inspected. Distal sites are selected for coronary bypass grafting.  The patient has relatively small coronary arteries.  The left ventricle appears essentially normal.  Portions of saphenous vein and the left internal mammary artery are trimmed to appropriate lengths.  A temperature probe is placed in the left ventricular septum, and a styrofoam pad is placed to protect the left phrenic nerve from thermal injury.  A cardioplegia catheter is placed in the ascending aorta.  The patient is cooled to 32 degrees systemic temperature.  The aortic crossclamp is applied, and cardioplegia is delivered in antegrade fashion through the aortic root.  Additional doses of cardioplegia are administered both through the aortic root and down subsequently placed vein grafts intermittently throughout the crossclamp portion of the operation to maintain supple temperature below 15 degrees C.  Iced saline slush is applied for topical hypothermia.  The following distal coronary anastomoses are performed:  1. The distal right coronary artery is grafted with a saphenous vein graft    in the end-to-side fashion using 7-0 Prolene suture.  This coronary    measures 1.7 mm in diameter and is of good quality a the site of distal    bypass. 1. The first diagonal branch off the left anterior descending coronary artery    is grafted with saphenous vein graft in end-to-side fashion using running    7-0 Prolene suture.  This coronary measures 1.0 mm in diameter at the site    of distal bypass.  It is diffusely diseased and has a 90% proximal    stenosis.  This is a fair quality vessel  at best. 3. The distal left anterior descending coronary artery is grafted with the     left internal mammary artery using running 8-0 Prolene suture.  This    coronary measures 1.4 mm  at the site of distal bypass.  A 1.0 mm probe    will pass distally.  It is a relatively small vessel.  It is of good    quality at the site of distal bypass, although there is some plaque    posteriorly.  Both proximal saphenous vein anastomoses are performed directly to the ascending aorta prior to removal of the aortic crossclamp.  The septal temperature is noted to rise rapidly and dramatically upon reperfusion of the left internal mammary artery.  The aortic crossclamp is removed after a total crossclamp time of 56 minutes.  The patient is rewarmed to greater than 37 degrees C temperature. The heart is defibrillated into normal sinus rhythm.  All  proximal and distal anastomoses are inspected for hemostasis and appropriate graft orientation.  Epicardial pacing wires are fixed to the right ventricular outflow tract into the right atrial appendage.  The patient is weaned from cardiopulmonary bypass without difficulty.  The patients rhythm at separation from bypass is normal sinus rhythm.  No inotropic support is  required.  Total cardiopulmonary bypass time for the operation is 80 minutes.  The venous and arterial cannulae are removed uneventfully.  Protamine is administered to reverse the anticoagulation.  The mediastinum and left chest are irrigated with saline solution containing vancomycin.  Meticulous surgical hemostasis is ascertained.  The mediastinum and both the left and right pleural spaces are drained with four chest tubes placed through separate stab incisions inferiorly.  The median sternotomy is closed in routine fashion.  The right thigh incisions are closed in multiple layers in routine fashion.  All skin incisions are closed with subcuticular skin closures.  The patient tolerated  the procedure well and is transported to the surgical intensive care unit in stable condition.   There are no intraoperative complications.  The patient is transfused 2 units of packed red blood cells during cardiopulmonary bypass for anemia during cardiopulmonary bypass with hematocrit of 14% after institution of bypass. DD:  01/02/00 TD:  01/07/00 Job: [id number]

## 2010-12-29 NOTE — H&P (Signed)
[place]. [facility]  Patient:    Natalie, Mccullough                       MRN: [id number] Adm. Date:  [id number] Attending:  Darylene Price CC:         Cardiac Catheterization Laboratory             [facility]. Terrence Dupont, M.D.                         History and Physical  PROCEDURE:  Left cardiac catheterization with selective left and right coronary angiography, left ventriculography via right groin using Judkins technique.  INDICATIONS FOR PROCEDURE:  Mr. [name] is a 70 year old white female with a past medical history significant for coronary artery disease, status post PTCA and stenting to LAD and diagonal #1 in January of 2001, history of hypertension, hypercholesterolemia, tobacco abuse, continues to smoke, history of peptic ulcer disease, history of interstitial cystitis, history of Arnold-Chiari malformation, positive family history of coronary artery disease.  She came to the ER from cardiac rehabilitation.  While in rehabilitation, the patient complained of retrosternal chest heaviness described as someone sitting over the chest, radiating to the neck and back associated with nausea and diaphoresis.  A grade 6/10, received two sublingual nitroglycerin with relief of chest pain.  Denies palpitation, lightheadedness, or syncope.  Denies PND, orthopnea, leg swelling.  The patient states she gets occasional chest pain but has not used nitroglycerin since the last one month.  Denies any fever, chills, or cough.  PAST MEDICAL HISTORY:  As above.  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 She had cholecystectomy many years ago.  She had right cataract surgery many years ago.  SOCIAL HISTORY:  She is married, worked as a Training and development officer, smoked one and a half packs x 37 years, quit three months ago post PTCA and stenting and restarted one month ago.  No history of alcohol abuse.  FAMILY HISTORY:  Father died of MI at the age of  21.  Mother is alive.  She is 86.  She has coronary artery disease.  She has permanent pacemaker.  One brother had coronary artery disease requiring coronary artery bypass grafting. One sister died of a rupture of an aneurysm of the brain.  MEDICATIONS:  She was on Toprol 50 mg half tablet p.o. q.d., Norvasc 5 mg p.o. q.d., Nitro-Dur 0.4 mg/h apply to chest wall in a.m., off at night, Zocor 40 mg p.o. q.d., enteric-coated aspirin one p.o. q.d., Prevacid 30 mg p.o. b.i.d.  BRIEF HOSPITAL COURSE:  The patient was admitted to the telemetry unit.  MI was ruled out by serial enzymes and ECG.  The patient underwent Persantine Cardiolite on May 20, which showed reversible ischemia in the anterolateral wall with ejection fraction of 61%, and due to recurrent chest pain, multiple risk factors and positive Persantine Cardiolite, the patient was advised for catheterization, possible angioplasty.  DESCRIPTION OF PROCEDURE:  After obtaining the informed consent, the patient was brought to the catheterization lab and was placed on the fluoroscopy table.  The right groin was prepped and draped in the usual fashion. Xylocaine 2% was used for local anesthesia in the right groin.  With the help of a thin-walled needle a 6 French arterial sheath was placed.  The sheath was aspirated and flushed.  Next, 6 French left Judkins catheter was advanced over the  wire under fluoroscopic guidance up to the ascending aorta.  The wire was pulled out.  The catheter was aspirated and connected to the manifold.  The catheter was further advanced and engaged into the left coronary ostium. Multiple views of the left system were taken.  Next, the catheter was disengaged and was pulled out over the wire and was replaced with a 6 French right Judkins catheter which was advanced over the wire under fluoroscopic guidance up to the ascending aorta.  The wire was pulled out.  The catheter was aspirated and connected to the  manifold.  The catheter was further advanced and engaged into the right coronary ostium.  Multiple views of the right system were taken.  Next, the catheter was disengaged and was pulled out over the wire and was replaced with a 6 French pigtail which was advanced over the wire under fluoroscopic guidance up to the ascending aorta.  The wire was pulled out.  The catheter was aspirated and connected to the manifold.  The catheter was further advanced across the aortic valve into the LV.  LV pressures were then recorded.  Next, left ventriculography was done in the 30 degree RAO position.  Post angiographic pressures were recorded from LV and then pullback pressures were recorded from the aorta.  There was no gradient across the aortic valve.  Next, the pigtail catheter was pulled out over the wire.  The sheaths were aspirated and flushed.  The patient tolerated procedure well.  There were no complications.  FINDINGS:  The patient has good LV systolic function.  Left main is patent.  LAD has 85% long, tubular in-stent re-stenosis in distal stent right at the bifurcation and beyond the bifurcation with diagonal #1.  In the proximal stent there is about 40-50% in-stent re-stenosis.  Diagonal #1 has 90% ostial in-stent re-stenosis.  Left circumflex is small but is patent.  OM-1 is very small which is patent. OM-2 is medium sized which is patent.  OM-3 is very very small, which is patent.  RCA has 70% mid stenosis.  The patient tolerated the procedure well and there were no complications.  Due to aggressive long restenosis in LAD and diagonal and the tortuous nature of the LAD at the anatomic site of the lesion, I believe the patient will benefit in the long-term with coronary artery bypass grafting.  The patient also has progressive atherosclerotic coronary disease in right coronary artery.  RCA lesion also appears significant ______ as compared to reviewing the previous films.  The  patient will be scheduled for coronary artery bypass grafting.  We will call a CVTS consult. DD:  01/01/00 TD:  01/04/00  Job: [id number]

## 2010-12-29 NOTE — Procedures (Signed)
. [facility]  Patient:    [name], [facility] Visit Number: [id number] MRN: [id number]          Service Type: END Location: ENDO Attending Physician:  Sherrin Daisy Dictated by:   Joyice Faster. Oletta Lamas, M.D. Proc. Date: 05/01/01 Admit Date:  05/01/2001   CC:         Bryon Lions, M.D.   Procedure Report  PROCEDURE PERFORMED:  Esophagogastroduodenoscopy.  ENDOSCOPIST:  Joyice Faster. Oletta Lamas, M.D.  MEDICATIONS:  Cetacaine spray, fentanyl 50 mcg, Versed 55 mg IV.  INDICATIONS:  Continued right upper quadrant pain of unclear etiology.  DESCRIPTION OF PROCEDURE:  The procedure had been explained to the patient and consent obtained.  With the patient in the left lateral decubitus position, the Olympus video endoscope was inserted blindly into the esophagus and advanced under direct visualization.  The stomach was entered, the pylorus identified and passed.  The duodenum including the bulb and second portion were seen well and were unremarkable.  The scope was withdrawn back into the stomach.  The pyloric channel, antrum and body were normal.  The fundus and cardia were seen well in the retroflex view and were normal.  There was hiatal hernia with a widely patent gastroesophageal junction.  The distal esophagus might have been slightly reddened.  There were no ulcerations or strictures. The scope was withdrawn and the proximal esophagus was normal.  The patient tolerated the procedure well and was  maintained on low-flow oxygen and pulse oximetry throughout the procedure.  ASSESSMENT:  Hiatal hernia with probably gastroesophageal reflux disease.  PLAN:  Will try Nexium twice daily and see back in our office in three to four weeks. Dictated by:   Joyice Faster. Oletta Lamas, M.D. Attending Physician:  Sherrin Daisy DD:  05/01/01 TD:  05/01/01 Job: [id number]

## 2010-12-29 NOTE — Discharge Summary (Signed)
[place]. [facility]  Patient:    [name], [name] Visit Number: [id number] MRN: [id number]          Service Type: MED Location: [phone] Attending Physician:  Clent Demark Dictated by:   Allegra Lai Terrence Dupont, M.D. Admit Date:  06/15/2001 Discharge Date: 06/17/2001                             Discharge Summary  DIABETES MELLITUS DIAGNOSES: 1. Accelerated angina, rule out myocardial infarction. 2. Coronary artery disease, status post coronary artery bypass graft. 3. Hypertension. 4. Hypercholesterolemia. 5. History of tobacco abuse. 6. Positive family history of coronary artery disease.  FINAL DIAGNOSES: 1. Stable angina. Negative Persantine Cardiolite. 2. Coronary artery disease status post coronary artery bypass graft in the    past. 3. Hypertension. 4. Hypercholesterolemia. 5. History of tobacco abuse. 6. Positive family history of coronary artery disease. 7. Gastroesophageal reflux disease.  DISCHARGE MEDICATIONS: 1. Nitro-Dur 0.2 mg per hour, apply to chest wall in a.m., off at night. 2. Norvasc 5 mg one tablet daily. 3. Toprol XL 25 mg half tablet daily. 4. Zocor 40 mg one tablet daily at night. 5. Enteric coated aspirin 325 mg one tablet daily. 6. Nexium 40 mg one capsule daily, half hour before breakfast. 7. Nitrostat 0.4 mg sublingual use as directed.  ACTIVITY:  As tolerated.  DIET:  Low salt, low cholesterol.  FOLLOW-UP:  She will follow up with me in one week.  CONDITION ON DISCHARGE:  Stable.  BRIEF HISTORY AND HOSPITAL COURSE:  [name] is a 70 year old white female with past medical history significant for CAD, status post PTCA and stenting to LAD and diagonal and status post aggressive restenosis in the LAD and diagonal and new lesion in RCA requiring CABG, hypertension, hypercholesterolemia, and tobacco abuse.  There is a strong family history for coronary artery disease.  She came to the ER complaining of  recurrent retrosternal and right-sided chest pain while in church, associated with diaphoresis. She took one sublingual nitroglycerin with partial relief.  She drove to the ER and on the way to the ER, she got relief in the chest pain and decided to go home.  On the way back home, again, developed similar chest pain so decided to come to ER.  The patient received two sublingual nitroglycerin in the ER with relief of chest pain.  The patient denies any nausea, vomiting, palpitation, lightheadedness, or syncope.  She denies fevers, chills or cough. She denies relation of chest pain to food or movement.  PAST MEDICAL HISTORY:  As above. She also has history of ___________ with GERD.  She had endoscopy done in September of 2002. She also has history of peptic ulcer disease in the past, history of interstitial cystitis in the past and history of Arnold-Chiari malformation.  PAST SURGICAL HISTORY:  She had cholecystectomy approximately two years ago, status post hysterectomy many years ago.  She had right eye surgery many years ago. She had coronary artery bypass grafting approximately two years ago.  She had LIMA to LAD, saphenous vein graft to diagonal I and saphenous vein graft to RCA.  SOCIAL HISTORY:  She is married and has four children. She smoked one and a half to two packs per day for 40+ years.  She quit in May of 2000 after the bypass.  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  No history of alcohol abuse.  FAMILY HISTORY:  Father died of MI at the age of 86.  Mother is alive, she had MI and stroke.  She has three brothers who had coronary artery disease, subsequently requiring coronary artery bypass grafting.  One sister died of rupture aneurysm of the brain.  MEDICATIONS AT HOME: 1. Cozaar 50 mg p.o. q.d. 2. Zocor 40 mg p.o. q.d. 3. Norvasc 5 mg p.o. q.d. 4. Enteric coated aspirin one p.o. q.d. 5. Nexium 40 mg p.o. q.d. 6.  Hydrochlorothiazide one tablet daily which was started by her primary M.D.  PHYSICAL EXAMINATION:  GENERAL APPEARANCE:  She was awake, alert and oriented x 3, no acute distress.  VITAL SIGNS:  Blood pressure was 130/76, pulse 76 and regular.  HEENT:  Conjunctiva was pink.  NECK:  Supple, no JVD, no bruits.  LUNGS:  Clear to auscultation without rhonchi or rales.  CARDIOVASCULAR:  S1 and S2 normal. There was soft systolic murmur at the apex. There was no S3 gallop.  ABDOMEN:  Soft, bowel sounds present, nontender.  EXTREMITIES:  There was no clubbing, cyanosis, or edema.  LABS:  Her EKG showed normal sinus rhythm with nonspecific STT wave changes.  Two sets of CPK, MB, and troponin I were negative. Hemoglobin was 13.6, hematocrit 39.3, white count 10.3.  Cholesterol 160, HDL 39, LDL 103.  Repeat hemoglobin yesterday was 11.5, hematocrit 33.4. Repeat hemoglobin today is 11.9, hematocrit 35.1, white count 8.2. Potassium 3.5, BUN 12, creatinine 0.8. Stool for occult blood was negative.  BRIEF HOSPITAL COURSE: The patient was admitted to telemetry unit.  MI was ruled out by serial enzymes and EKG. The patient did not have further episodes of chest pain.  During the hospital stay, the patient underwent Persantine Cardiolite on June 16, 2001, which showed no evidence of reversible ischemia. There was breast attenuation and anterior wall ejection fraction of 73%. The patient did drop her hemoglobin from 13.6 to 11.5. Repeat hemoglobin is stable today.  It is 11.9, as stated above.  Stool for occult blood is negative.  The patient is off Lovenox since yesterday. There were no further episodes of chest pain during the hospital stay. The patient will be discharged home on the above medications and will be followed up in my office in one week.  Dictated by:   Allegra Lai Terrence Dupont, M.D. Attending Physician:  Clent Demark DD:  06/17/01 TD:  06/18/01 Job: [id number]

## 2010-12-29 NOTE — H&P (Signed)
[name], [name] NO.:  [id number]   MEDICAL RECORD NO.:  [id number]          PATIENT TYPE:  INP   LOCATION:  [id number]                         FACILITY:  [facility]   PHYSICIAN:  Juanito Doom, MD       DATE OF BIRTH:  1940-08-31   DATE OF ADMISSION:  09/28/2006  DATE OF DISCHARGE:                              HISTORY & PHYSICAL   CHIEF COMPLAINT:  Cough.   HISTORY OF PRESENT ILLNESS:  This is a 70 year old, white female patient  who comes to the hospital because of cough.  The cough started  yesterday.  It is now associated with green sputum.  She also complained  of diaphoresis, fever and chills.  Also patient does complain of some  wheezing.  She has a history of asthma.  She starting developing  wheezing after she started coughing.  She denies any nausea, no  vomiting, no diarrhea.  She denies any dysuria.  She denies any chest  pain.  No PND.  She is slightly dyspneic.  She denies any headache.  No  blurry vision.  She also denies any leg, calf, feet swelling.   PAST MEDICAL HISTORY:  Includes:  1. Asthma.  2. Heart disease.  3. History of hypertension.  4. History of MI.  She has a history of triple CABG in 2002.   SOCIAL HISTORY:  She quit cigarettes smoking in 2002 after her heart  attack.  She does not drink alcohol.  She lives with her spouse.   FAMILY HISTORY:  Noncontributory.   HOME MEDICATIONS:  1. She takes Aspirin 81 mg daily.  2. Cozaar 100 mg daily.  3. Nexium 40 mg daily.  4. Norvasc 5 mg daily.  5. Zocor 80 mg p.o. q.h.s.   ALLERGIES:  SHE HAS NO KNOWN DRUG ALLERGIES.   PHYSICAL EXAMINATION:  VITAL SIGNS:  Shows temperature of 100.6, blood  pressure of 116/51, pulse rate of 75, respiration of 20.  O2 saturation  90% on room air.  HEAD/NECK:  Shows pink conjunctivae.  She has no jaundice.  Her neck is  supple.  Her mucous membranes are moist.  CHEST:  Positive respirations.  Auscultation shows mild rhonchi, but  patient has just been  given some breathing treatment.  CARDIOVASCULAR:  Shows normal heart sounds with no murmur and no gallop.  Pulses are palpable in all her limbs.  ABDOMEN:  Soft, nontender.  No masses palpable.  The patient has normal  bowel sounds.  EXTREMITIES:  Show no feet edema.  NEUROLOGIC:  The patient is alert and oriented x3.  Her power is 4/4 in  all limbs.  Her speech is clear.  Sensation is normal.  There are no  focal deficits.   Her chest x-ray shows left lower lobe pneumonia with mild effusion.  EKG  shows no acute ST-T changes.  WBC 12.4 which is slightly elevated,  hemoglobin 11.9, hematocrit 34.7, platelets is normal 223.  Her sodium  is 135, potassium is 3.5 which is borderline-normal, chloride is 106,  bicarb is 22, BUN is 7, creatinine is 0.08.   ASSESSMENT:  1. Left lower lobe pneumonia.  2. Leukocytosis secondary to pneumonia.  3. Asthma exacerbation.  4. History of hypertension.  Her blood pressure is currently normal.   PLAN:  Admit patient to telemetry floor.  I will continue her previous  home medications.  Will also give her 20 mEq of potassium.  I will start  her on IV Rocephin 1 gram q.24h. and IV Zithromax 500 mg q.24h.  Patient  will still receive albuterol and Atrovent nebs treatments.  Patient has  had blood cultures at the ED.  She also received prednisone 60 mg p.o.  at the ED.  I will give her prednisone 40 mg p.o. daily.  She will have  Tylenol for fever and she may have Dilaudid for pain as needed.  Patient  will be admitted to telemetry bed.      Juanito Doom, MD  Electronically Signed     GA/MEDQ  D:  09/28/2006  T:  09/29/2006  Job:  [id number]

## 2010-12-29 NOTE — Assessment & Plan Note (Signed)
[facility]                               PULMONARY OFFICE NOTE   NAME:Natalie, Mccullough                       MRN:          [id number]  DATE:03/12/2006                            DOB:          [birth year]    [name] is a 70 year old female whom I have not seen here since May of  2006.  The patient has known chronic obstructive pulmonary disease with  asthmatic bronchitic component.  The patient presents due to having had an  asthma flare up two weeks prior to this visit.  She was treated with  prednisone and again was given another round of prednisone a week ago.  She  was treated by her primary care physician  at that time.  She is also in the  process of changing primary care physicians.   The patient states that she has had difficulties with cough productive of  greenish sputum production.  She denies any fever, chills or sweats.  She is  currently using Advair 250/50 but is not using Spiriva which was prescribed  for her previously.  She uses albuterol as rescue.  The patient denies any  other symptomatology.   CURRENT MEDICATIONS:  Prednisone, eye drops, aspirin, Norvasc, Toprol,  Nexium, Advair, Cozaar, Zetia and Simvastatin.  In addition, patient takes  Ambien p.r.n. and Xopenex p.r.n. as well as nitroglycerin p.r.n.  For the  exact dosages, please refer to the intake sheet.   PHYSICAL EXAMINATION:  GENERAL APPEARANCE:  This is a well-developed,  somewhat obese female who is in no acute distress.  VITAL SIGNS:  As noted.  Oxygen saturation is 98% on room air.  HEENT:  Unremarkable for age.  NECK:  Supple.  No adenopathy noted.  No JVD.  LUNGS:  Diffuse rhonchi and no wheezes.  CARDIOVASCULAR:  Regular rate and rhythm.  No murmurs, rubs, or gallops.  EXTREMITIES:  Patient has no clubbing, cyanosis, or edema.   We did perform ambulatory oximetry.  The patient completed testing without  difficulty.  She did not desaturate past 98%.  We did  perform spirometry  which did not reveal any major obstructive issues.  Her FEV1 was 1.59 liters  or 72% predicted.  Her FEF 25-75 did show some reduction and therefore some  small airway component at 56% predicted.  We did also perform chest x-ray  which showed no acute infiltrate.   IMPRESSION:  Chronic obstructive pulmonary disease with asthmatic bronchitic  component.  Patient does have small airways component.  She does have acute  tracheobronchitis.   PLAN:  1. Treat the patient with Avelox 400 mg daily x5 days.  2. Will change Advair to Symbicort 160 4.5 two inhalations twice a day.  3. Continue other medications as they are.  4. Follow-up will be in 6-8 weeks' time.  She is to contact us prior to      that time should any new problems arise.  Natalie Don, MD   CLG/MedQ  DD:  04/02/2006  DT:  04/02/2006  Job #:  [id number]

## 2011-02-19 ENCOUNTER — Other Ambulatory Visit: Payer: Self-pay | Admitting: Internal Medicine

## 2011-02-19 DIAGNOSIS — M5410 Radiculopathy, site unspecified: Secondary | ICD-10-CM

## 2011-02-19 DIAGNOSIS — M545 Low back pain, unspecified: Secondary | ICD-10-CM

## 2011-03-01 ENCOUNTER — Other Ambulatory Visit: Payer: Medicare Other

## 2011-03-06 ENCOUNTER — Other Ambulatory Visit: Payer: Medicare Other

## 2011-05-02 ENCOUNTER — Other Ambulatory Visit (HOSPITAL_COMMUNITY): Payer: Self-pay | Admitting: Internal Medicine

## 2011-05-02 DIAGNOSIS — M545 Low back pain, unspecified: Secondary | ICD-10-CM

## 2011-05-02 DIAGNOSIS — M5410 Radiculopathy, site unspecified: Secondary | ICD-10-CM

## 2011-05-08 ENCOUNTER — Ambulatory Visit (HOSPITAL_COMMUNITY)
Admission: RE | Admit: 2011-05-08 | Discharge: 2011-05-08 | Disposition: A | Discharge status: Home / Self Care | Payer: Medicare Other | Source: Ambulatory Visit | Attending: Internal Medicine | Admitting: Internal Medicine

## 2011-05-08 DIAGNOSIS — M25559 Pain in unspecified hip: Secondary | ICD-10-CM | POA: Insufficient documentation

## 2011-05-08 DIAGNOSIS — R209 Unspecified disturbances of skin sensation: Secondary | ICD-10-CM | POA: Insufficient documentation

## 2011-05-08 DIAGNOSIS — M545 Low back pain, unspecified: Secondary | ICD-10-CM | POA: Insufficient documentation

## 2011-05-08 DIAGNOSIS — M51379 Other intervertebral disc degeneration, lumbosacral region without mention of lumbar back pain or lower extremity pain: Secondary | ICD-10-CM | POA: Insufficient documentation

## 2011-05-08 DIAGNOSIS — M5410 Radiculopathy, site unspecified: Secondary | ICD-10-CM

## 2011-05-08 DIAGNOSIS — M5137 Other intervertebral disc degeneration, lumbosacral region: Secondary | ICD-10-CM | POA: Insufficient documentation

## 2011-05-08 DIAGNOSIS — R29898 Other symptoms and signs involving the musculoskeletal system: Secondary | ICD-10-CM | POA: Insufficient documentation

## 2011-05-16 LAB — POCT I-STAT, CHEM 8
Creatinine, Ser: 0.8
Hemoglobin: 10.9 — ABNORMAL LOW
Potassium: 3.9
Sodium: 141

## 2011-05-18 LAB — BASIC METABOLIC PANEL
BUN: 6
BUN: 9
CO2: 24
CO2: 27
Chloride: 107
Chloride: 111
Creatinine, Ser: 0.69
Creatinine, Ser: 0.77
Glucose, Bld: 100 — ABNORMAL HIGH
Glucose, Bld: 99
Potassium: 2.9 — ABNORMAL LOW

## 2011-05-18 LAB — CBC
Hemoglobin: 10.9 — ABNORMAL LOW
MCHC: 34.1
MCV: 87
Platelets: 228
Platelets: 236
RDW: 13.4

## 2011-05-18 LAB — LIPID PANEL
Cholesterol: 179
LDL Cholesterol: 124 — ABNORMAL HIGH
Triglycerides: 70
VLDL: 14

## 2011-05-18 LAB — CARDIAC PANEL(CRET KIN+CKTOT+MB+TROPI)
CK, MB: 0.7
CK, MB: 1
Troponin I: 0.02
Troponin I: 0.02

## 2011-05-18 LAB — HEPARIN LEVEL (UNFRACTIONATED)
Heparin Unfractionated: 0.37
Heparin Unfractionated: 0.38

## 2011-05-21 LAB — CBC
HCT: 34.3 — ABNORMAL LOW
HCT: 36.6
Hemoglobin: 11.6 — ABNORMAL LOW
Hemoglobin: 12.4
MCHC: 33.8
MCV: 87.2
Platelets: 238
RBC: 3.9
RDW: 13.3
WBC: 9.9

## 2011-05-21 LAB — DIFFERENTIAL
Basophils Absolute: 0
Basophils Relative: 0
Eosinophils Relative: 1
Monocytes Absolute: 0.4
Monocytes Relative: 4

## 2011-05-21 LAB — URINALYSIS, ROUTINE W REFLEX MICROSCOPIC
Hgb urine dipstick: NEGATIVE
Ketones, ur: NEGATIVE
Protein, ur: NEGATIVE
Urobilinogen, UA: 0.2

## 2011-05-21 LAB — I-STAT 8, (EC8 V) (CONVERTED LAB)
Chloride: 107
HCT: 43
Hemoglobin: 14.6
Operator id: 257131
Potassium: 4
TCO2: 27
pCO2, Ven: 42.6 — ABNORMAL LOW

## 2011-05-21 LAB — POCT CARDIAC MARKERS: Operator id: 151321

## 2011-05-21 LAB — COMPREHENSIVE METABOLIC PANEL
ALT: 25
AST: 31
Albumin: 3.2 — ABNORMAL LOW
Alkaline Phosphatase: 87
CO2: 22
Chloride: 109
Creatinine, Ser: 0.61
GFR calc Af Amer: >60
GFR calc non Af Amer: >60
Potassium: 3.7
Sodium: 140
Total Bilirubin: 0.8

## 2011-05-21 LAB — POCT I-STAT CREATININE: Creatinine, Ser: 0.8

## 2011-05-29 ENCOUNTER — Emergency Department (HOSPITAL_COMMUNITY): Payer: Medicare Other

## 2011-05-29 ENCOUNTER — Emergency Department (HOSPITAL_COMMUNITY)
Admission: EM | Admit: 2011-05-29 | Discharge: 2011-05-29 | Disposition: A | Discharge status: Home / Self Care | Payer: Medicare Other | Attending: Emergency Medicine | Admitting: Emergency Medicine

## 2011-05-29 DIAGNOSIS — I519 Heart disease, unspecified: Secondary | ICD-10-CM | POA: Insufficient documentation

## 2011-05-29 DIAGNOSIS — I10 Essential (primary) hypertension: Secondary | ICD-10-CM | POA: Insufficient documentation

## 2011-05-29 DIAGNOSIS — I252 Old myocardial infarction: Secondary | ICD-10-CM | POA: Insufficient documentation

## 2011-05-29 DIAGNOSIS — Z79899 Other long term (current) drug therapy: Secondary | ICD-10-CM | POA: Insufficient documentation

## 2011-05-29 DIAGNOSIS — R112 Nausea with vomiting, unspecified: Secondary | ICD-10-CM | POA: Insufficient documentation

## 2011-05-29 DIAGNOSIS — E7800 Pure hypercholesterolemia, unspecified: Secondary | ICD-10-CM | POA: Insufficient documentation

## 2011-05-29 DIAGNOSIS — R197 Diarrhea, unspecified: Secondary | ICD-10-CM | POA: Insufficient documentation

## 2011-05-29 LAB — COMPREHENSIVE METABOLIC PANEL
ALT: 9 U/L (ref 0–35)
Alkaline Phosphatase: 100 U/L (ref 39–117)
BUN: 15 mg/dL (ref 6–23)
CO2: 22 mEq/L (ref 19–32)
Chloride: 104 mEq/L (ref 96–112)
GFR calc Af Amer: >90 mL/min (ref >90)
GFR calc non Af Amer: 86 mL/min — ABNORMAL LOW (ref >90)
Glucose, Bld: 177 mg/dL — ABNORMAL HIGH (ref 70–99)
Potassium: 3.8 mEq/L (ref 3.5–5.1)
Total Bilirubin: 0.4 mg/dL (ref 0.3–1.2)
Total Protein: 8.1 g/dL (ref 6.0–8.3)

## 2011-05-29 LAB — CBC
HCT: 36.7 % (ref 36.0–46.0)
Hemoglobin: 12.6 g/dL (ref 12.0–15.0)
MCHC: 34.3 g/dL (ref 30.0–36.0)
RBC: 4.32 MIL/uL (ref 3.87–5.11)

## 2011-05-29 LAB — POCT I-STAT TROPONIN I: Troponin i, poc: 0.01 ng/mL (ref 0.00–0.08)

## 2011-05-29 LAB — LIPASE, BLOOD: Lipase: 59 U/L (ref 11–59)

## 2011-05-29 LAB — DIFFERENTIAL
Basophils Absolute: 0 10*3/uL (ref 0.0–0.1)
Lymphocytes Relative: 4 % — ABNORMAL LOW (ref 12–46)
Monocytes Absolute: 0.5 10*3/uL (ref 0.1–1.0)
Neutro Abs: 15.5 10*3/uL — ABNORMAL HIGH (ref 1.7–7.7)
Neutrophils Relative %: 93 % — ABNORMAL HIGH (ref 43–77)

## 2011-05-29 LAB — URINALYSIS, ROUTINE W REFLEX MICROSCOPIC
Bilirubin Urine: NEGATIVE
Glucose, UA: NEGATIVE mg/dL
Hgb urine dipstick: NEGATIVE
Ketones, ur: NEGATIVE mg/dL
Protein, ur: NEGATIVE mg/dL
Urobilinogen, UA: 0.2 mg/dL (ref 0.0–1.0)

## 2011-05-29 MED ORDER — IOHEXOL 300 MG/ML  SOLN
100.0000 mL | Freq: Once | INTRAMUSCULAR | Status: AC | PRN
  Administered 2011-05-29: 100 mL via INTRAVENOUS

## 2011-05-30 LAB — URINALYSIS, ROUTINE W REFLEX MICROSCOPIC
Glucose, UA: NEGATIVE
Hgb urine dipstick: NEGATIVE
Ketones, ur: NEGATIVE
Leukocytes, UA: NEGATIVE
Nitrite: NEGATIVE
Protein, ur: 30 — AB
Specific Gravity, Urine: 1.025
Urobilinogen, UA: 0.2
pH: 5.5

## 2011-05-30 LAB — I-STAT 8, (EC8 V) (CONVERTED LAB)
Acid-base deficit: 1
BUN: 19
Bicarbonate: 24.1 — ABNORMAL HIGH
Chloride: 106
Glucose, Bld: 126 — ABNORMAL HIGH
HCT: 41
Hemoglobin: 13.9
Operator id: 279831
Potassium: 3.7
Sodium: 138
TCO2: 25
pCO2, Ven: 42.3 — ABNORMAL LOW
pH, Ven: 7.363 — ABNORMAL HIGH

## 2011-05-30 LAB — URINE MICROSCOPIC-ADD ON

## 2011-05-30 LAB — POCT I-STAT CREATININE
Creatinine, Ser: 0.8
Operator id: 279831

## 2011-08-22 ENCOUNTER — Other Ambulatory Visit (HOSPITAL_COMMUNITY): Payer: Self-pay | Admitting: Family Medicine

## 2011-08-28 ENCOUNTER — Ambulatory Visit (HOSPITAL_COMMUNITY)
Admission: RE | Admit: 2011-08-28 | Discharge: 2011-08-28 | Disposition: A | Discharge status: Home / Self Care | Payer: Medicare Other | Source: Ambulatory Visit | Attending: Family Medicine | Admitting: Family Medicine

## 2011-08-28 DIAGNOSIS — J45909 Unspecified asthma, uncomplicated: Secondary | ICD-10-CM | POA: Insufficient documentation

## 2011-08-28 MED ORDER — ALBUTEROL SULFATE (5 MG/ML) 0.5% IN NEBU
2.5000 mg | INHALATION_SOLUTION | Freq: Once | RESPIRATORY_TRACT | Status: AC
  Administered 2011-08-28: 2.5 mg via RESPIRATORY_TRACT

## 2011-09-26 ENCOUNTER — Other Ambulatory Visit: Payer: Self-pay | Admitting: Vascular Surgery

## 2011-09-28 ENCOUNTER — Other Ambulatory Visit (HOSPITAL_COMMUNITY): Payer: Self-pay | Admitting: Family Medicine

## 2011-09-28 DIAGNOSIS — Z1231 Encounter for screening mammogram for malignant neoplasm of breast: Secondary | ICD-10-CM

## 2011-10-17 ENCOUNTER — Other Ambulatory Visit: Payer: Medicare Other

## 2011-10-17 ENCOUNTER — Ambulatory Visit: Payer: Medicare Other | Admitting: Vascular Surgery

## 2011-10-23 ENCOUNTER — Ambulatory Visit (HOSPITAL_COMMUNITY): Payer: Medicare Other

## 2011-10-25 ENCOUNTER — Ambulatory Visit (HOSPITAL_COMMUNITY)
Admission: RE | Admit: 2011-10-25 | Discharge: 2011-10-25 | Disposition: A | Discharge status: Home / Self Care | Payer: Medicare Other | Source: Ambulatory Visit | Attending: Family Medicine | Admitting: Family Medicine

## 2011-10-25 DIAGNOSIS — Z1231 Encounter for screening mammogram for malignant neoplasm of breast: Secondary | ICD-10-CM

## 2011-11-27 ENCOUNTER — Encounter: Payer: Self-pay | Admitting: Neurosurgery

## 2011-12-05 ENCOUNTER — Ambulatory Visit: Payer: Medicare Other | Admitting: Vascular Surgery

## 2011-12-05 ENCOUNTER — Other Ambulatory Visit: Payer: Medicare Other

## 2011-12-11 ENCOUNTER — Encounter: Payer: Self-pay | Admitting: Neurosurgery

## 2011-12-12 ENCOUNTER — Other Ambulatory Visit: Payer: Medicare Other

## 2011-12-12 ENCOUNTER — Ambulatory Visit: Payer: Self-pay | Admitting: Neurosurgery

## 2011-12-13 ENCOUNTER — Encounter: Payer: Self-pay | Admitting: Neurosurgery

## 2011-12-17 ENCOUNTER — Encounter (INDEPENDENT_AMBULATORY_CARE_PROVIDER_SITE_OTHER): Payer: Medicare Other | Admitting: *Deleted

## 2011-12-17 ENCOUNTER — Ambulatory Visit (INDEPENDENT_AMBULATORY_CARE_PROVIDER_SITE_OTHER): Payer: Medicare Other | Admitting: *Deleted

## 2011-12-17 ENCOUNTER — Ambulatory Visit (INDEPENDENT_AMBULATORY_CARE_PROVIDER_SITE_OTHER): Payer: Medicare Other | Admitting: Neurosurgery

## 2011-12-17 ENCOUNTER — Encounter: Payer: Self-pay | Admitting: Neurosurgery

## 2011-12-17 VITALS — BP 153/65 | HR 58 | Resp 16 | Ht 62.0 in | Wt 173.5 lb

## 2011-12-17 DIAGNOSIS — I739 Peripheral vascular disease, unspecified: Secondary | ICD-10-CM

## 2011-12-17 DIAGNOSIS — Z48812 Encounter for surgical aftercare following surgery on the circulatory system: Secondary | ICD-10-CM

## 2011-12-17 DIAGNOSIS — I70219 Atherosclerosis of native arteries of extremities with intermittent claudication, unspecified extremity: Secondary | ICD-10-CM

## 2011-12-17 DIAGNOSIS — I6529 Occlusion and stenosis of unspecified carotid artery: Secondary | ICD-10-CM

## 2011-12-17 NOTE — Progress Notes (Addendum)
[facility] HISTORY AND PHYSICAL   CC: Annual carotid duplex with lower extremity ABIs Referring Physician: Scot Dock  History of Present Illness: 71 year old patient of Dr. Scot Dock followed for serial carotid duplex as well as lower extremity ABIs. Patient did have a carotid endarterectomy on the left side in August of 2011 and left-sided lower extremity femoral artery angioplasty and stenting  in 2009. She reports no signs or symptoms of CVA, TIA, diplopia, dysphasia, amaurosis fugax or word finding difficulty, she also does not have any complaints of lower extremity claudication.  Past Medical History  Diagnosis Date  . Asthma   . Reflux   . Hiatal hernia   . Peripheral vascular disease   . Hypertension   . Hyperlipidemia   . Iliac artery aneurysm   . Aortoiliac occlusive disease   . 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 Carotid artery occlusion   . Myocardial infarction 01/01/2000    Cardiac catheterization  . COPD (chronic obstructive pulmonary disease)   . Coronary artery disease     ROS: [x]  Positive   [ ]  Denies    General: [ ]  Weight loss, [ ]  Fever, [ ]  chills Neurologic: [ ]  Dizziness, [ ]  Blackouts, [ ]  Seizure [ ]  Stroke, [ ]  "Mini stroke", [ ]  Slurred speech, [ ]  Temporary blindness; [ ]  weakness in arms or legs, [ ]  Hoarseness Cardiac: [ ]  Chest pain/pressure, [ ]  Shortness of breath at rest [ ]  Shortness of breath with exertion, [ ]  Atrial fibrillation or irregular heartbeat Vascular: [ ]  Pain in legs with walking, [ ]  Pain in legs at rest, [ ]  Pain in legs at night,  [ ]  Non-healing ulcer, [ ]  Blood clot in vein/DVT,   Pulmonary: [ ]  Home oxygen, [ ]  Productive cough, [ ]  Coughing up blood, [ ]  Asthma,  [ ]  Wheezing, hx of COPD Musculoskeletal:  [ ]  Arthritis, [ ]  Low back pain, [ ]  Joint pain Hematologic: [ ]  Easy Bruising, [ ]  Anemia; [ ]  Hepatitis Gastrointestinal: [ ]  Blood in stool, [ ]  Gastroesophageal Reflux/heartburn, [  ] Trouble swallowing Urinary: [ ]  chronic Kidney disease, [ ]  on HD - [ ]  MWF or [ ]  TTHS, [ ]  Burning with urination, [ ]  Difficulty urinating Skin: [ ]  Rashes, [ ]  Wounds Psychological: [ ]  Anxiety, [ ]  Depression   Social History History  Substance Use Topics  . Smoking status: Former Smoker    Types: Cigarettes    Quit date: 08/13/2000  . Smokeless tobacco: Not on file  . Alcohol Use:     Family History Family History  Problem Relation Age of Onset  . Heart disease Mother     Heart Disease before age 41  . Hypertension Mother   . Hyperlipidemia Mother   . Heart attack Mother   . Clotting disorder Mother   . Heart disease Father     Heart Disease before age 68  . Heart attack Father   . Heart disease Brother     Heart Disease before age 22  . Hyperlipidemia Brother   . Hypertension Brother   . Clotting disorder Brother     Allergies  Allergen Reactions  . Avelox (Moxifloxacin Hcl In Nacl)   . Codeine   . Oxycodone-Acetaminophen     Current Outpatient Prescriptions  Medication Sig Dispense Refill  . amLODipine (NORVASC) 10 MG tablet Take 10 mg by mouth daily.      . clopidogrel (PLAVIX)  75 MG tablet TAKE 1 TABLET BY MOUTH EVERY DAY  90 tablet  0  . Dexlansoprazole (KAPIDEX PO) Take 60 mg by mouth daily.      . famotidine (PEPCID) 20 MG tablet Take 20 mg by mouth 2 (two) times daily.      . Fluticasone-Salmeterol (ADVAIR) 250-50 MCG/DOSE AEPB Inhale 1 puff into the lungs 2 (two) times daily.      Marland Kitchen losartan (COZAAR) 100 MG tablet Take 100 mg by mouth daily.      . phenytoin (DILANTIN) 30 MG ER capsule Take 40 mg by mouth daily.      . prednisoLONE acetate (PRED FORTE) 1 % ophthalmic suspension 1 drop 4 (four) times daily. Right eye qod      . rosuvastatin (CRESTOR) 10 MG tablet Take 10 mg by mouth daily.      . nitrofurantoin (FURADANTIN) 25 MG/5ML suspension Take 50 mg by mouth 4 (four) times daily.        Physical Examination  Filed Vitals:   12/17/11  1027  BP: 153/65  Pulse: 58  Resp: 16    Body mass index is 31.73 kg/(m^2).  General:  WDWN in NAD Gait: Normal HEENT: WNL Eyes: Pupils equal Pulmonary: normal non-labored breathing , without Rales, rhonchi,  wheezing Cardiac: RRR, without  Murmurs, rubs or gallops; Abdomen: soft, NT, no masses Skin: no rashes, ulcers noted  Vascular Exam Pulses: 2+ radial pulses, palpable PT and DP pulses bilaterally Carotid bruits: 2+ carotid pulses to auscultation bilaterally Extremities without ischemic changes, no Gangrene , no cellulitis; no open wounds;  Musculoskeletal: no muscle wasting or atrophy   Neurologic: A&O X 3; Appropriate Affect ; SENSATION: normal; MOTOR FUNCTION:  moving all extremities equally. Speech is fluent/normal  Non-Invasive Vascular Imaging CAROTID DUPLEX 12/17/2011  Right ICA 20 - 39 % stenosis Left ICA 0 - 19% stenosis ABIs today are 0.92 on the right with triphasic flow, 0.89 on the left with triphasic flow  ASSESSMENT/PLAN: Patient with history of known carotid disease as well as left lower extremity claudication corrected after surgery. Patient is in agreement with our plan and followup in one year with repeat carotid duplex and lower extremity ABIs she is to call the office if she has any further difficulties her questions were encouraged and answered.  Beatris Ship ANP   Clinic MD: Trula Slade

## 2011-12-18 NOTE — Progress Notes (Signed)
Addended by: Mena Goes on: 12/18/2011 09:24 AM   Modules accepted: Orders

## 2011-12-19 ENCOUNTER — Other Ambulatory Visit: Payer: Self-pay | Admitting: Vascular Surgery

## 2011-12-26 NOTE — Procedures (Unsigned)
CAROTID DUPLEX EXAM  INDICATION:  Follow up carotid disease.  HISTORY: Diabetes:  No. Cardiac:  No. Hypertension:  Yes. Smoking:  Previous. Previous Surgery:  03/29/2010:  Left CEA. CV History: Amaurosis Fugax No, Paresthesias No, Hemiparesis No.                                      RIGHT             LEFT Brachial systolic pressure:         168               169 Brachial Doppler waveforms:         WNL               WNL Vertebral direction of flow:        Antegrade         Antegrade DUPLEX VELOCITIES (cm/sec) CCA peak systolic                   64                93 ECA peak systolic                   99                [id number] ICA peak systolic                   150 (M)           [id number] ICA end diastolic                   35 (M)            24 PLAQUE MORPHOLOGY:                  Heterogenous PLAQUE AMOUNT:                      Mild-to-moderate  NA PLAQUE LOCATION:                    ICA  IMPRESSION: 1. 1% to 39% right internal carotid artery stenosis by velocity     criteria, which is stable compared to previous examination. 2. Widely patent left carotid endarterectomy without evidence of     restenosis. 3. Bilateral vertebral arteries are within normal limits.  ___________________________________________ Judeth Cornfield. Scot Dock, M.D.  LT/MEDQ  D:  12/17/2011  T:  12/17/2011  Job:  [id number]

## 2012-01-01 ENCOUNTER — Other Ambulatory Visit: Payer: Self-pay | Admitting: Cardiology

## 2012-04-02 ENCOUNTER — Other Ambulatory Visit: Payer: Self-pay | Admitting: *Deleted

## 2012-04-02 DIAGNOSIS — I739 Peripheral vascular disease, unspecified: Secondary | ICD-10-CM

## 2012-04-02 MED ORDER — CLOPIDOGREL BISULFATE 75 MG PO TABS: 75.0000 mg | ORAL_TABLET | Freq: Every day | ORAL | Status: DC

## 2012-07-02 ENCOUNTER — Other Ambulatory Visit (HOSPITAL_COMMUNITY): Payer: Self-pay | Admitting: Family Medicine

## 2012-07-02 DIAGNOSIS — R51 Headache: Secondary | ICD-10-CM

## 2012-07-14 ENCOUNTER — Ambulatory Visit (HOSPITAL_COMMUNITY)
Admission: RE | Admit: 2012-07-14 | Discharge: 2012-07-14 | Disposition: A | Discharge status: Home / Self Care | Payer: Medicare Other | Source: Ambulatory Visit | Attending: Family Medicine | Admitting: Family Medicine

## 2012-07-14 DIAGNOSIS — R51 Headache: Secondary | ICD-10-CM

## 2012-08-07 ENCOUNTER — Ambulatory Visit (HOSPITAL_COMMUNITY)
Admission: RE | Admit: 2012-08-07 | Discharge: 2012-08-07 | Disposition: A | Discharge status: Home / Self Care | Payer: Medicare Other | Source: Ambulatory Visit | Attending: Nurse Practitioner | Admitting: Nurse Practitioner

## 2012-08-07 ENCOUNTER — Other Ambulatory Visit (HOSPITAL_COMMUNITY): Payer: Self-pay | Admitting: Nurse Practitioner

## 2012-08-07 DIAGNOSIS — R05 Cough: Secondary | ICD-10-CM

## 2012-08-07 DIAGNOSIS — I517 Cardiomegaly: Secondary | ICD-10-CM | POA: Insufficient documentation

## 2012-08-07 DIAGNOSIS — R059 Cough, unspecified: Secondary | ICD-10-CM

## 2012-08-07 DIAGNOSIS — Z951 Presence of aortocoronary bypass graft: Secondary | ICD-10-CM | POA: Insufficient documentation

## 2012-08-11 ENCOUNTER — Encounter (HOSPITAL_COMMUNITY): Payer: Self-pay | Admitting: Emergency Medicine

## 2012-08-11 ENCOUNTER — Emergency Department (HOSPITAL_COMMUNITY)
Admission: EM | Admit: 2012-08-11 | Discharge: 2012-08-12 | Disposition: A | Discharge status: Home / Self Care | Payer: Medicare Other | Attending: Emergency Medicine | Admitting: Emergency Medicine

## 2012-08-11 DIAGNOSIS — J449 Chronic obstructive pulmonary disease, unspecified: Secondary | ICD-10-CM | POA: Insufficient documentation

## 2012-08-11 DIAGNOSIS — Z79899 Other long term (current) drug therapy: Secondary | ICD-10-CM | POA: Insufficient documentation

## 2012-08-11 DIAGNOSIS — I252 Old myocardial infarction: Secondary | ICD-10-CM | POA: Insufficient documentation

## 2012-08-11 DIAGNOSIS — R51 Headache: Secondary | ICD-10-CM | POA: Insufficient documentation

## 2012-08-11 DIAGNOSIS — Z951 Presence of aortocoronary bypass graft: Secondary | ICD-10-CM | POA: Insufficient documentation

## 2012-08-11 DIAGNOSIS — Z8719 Personal history of other diseases of the digestive system: Secondary | ICD-10-CM | POA: Insufficient documentation

## 2012-08-11 DIAGNOSIS — R5383 Other fatigue: Secondary | ICD-10-CM | POA: Insufficient documentation

## 2012-08-11 DIAGNOSIS — Z8679 Personal history of other diseases of the circulatory system: Secondary | ICD-10-CM | POA: Insufficient documentation

## 2012-08-11 DIAGNOSIS — I2510 Atherosclerotic heart disease of native coronary artery without angina pectoris: Secondary | ICD-10-CM | POA: Insufficient documentation

## 2012-08-11 DIAGNOSIS — J45909 Unspecified asthma, uncomplicated: Secondary | ICD-10-CM | POA: Insufficient documentation

## 2012-08-11 DIAGNOSIS — K219 Gastro-esophageal reflux disease without esophagitis: Secondary | ICD-10-CM | POA: Insufficient documentation

## 2012-08-11 DIAGNOSIS — J4489 Other specified chronic obstructive pulmonary disease: Secondary | ICD-10-CM | POA: Insufficient documentation

## 2012-08-11 DIAGNOSIS — I10 Essential (primary) hypertension: Secondary | ICD-10-CM | POA: Insufficient documentation

## 2012-08-11 DIAGNOSIS — I739 Peripheral vascular disease, unspecified: Secondary | ICD-10-CM | POA: Insufficient documentation

## 2012-08-11 DIAGNOSIS — Z7901 Long term (current) use of anticoagulants: Secondary | ICD-10-CM | POA: Insufficient documentation

## 2012-08-11 DIAGNOSIS — R5381 Other malaise: Secondary | ICD-10-CM | POA: Insufficient documentation

## 2012-08-11 DIAGNOSIS — R531 Weakness: Secondary | ICD-10-CM

## 2012-08-11 DIAGNOSIS — E785 Hyperlipidemia, unspecified: Secondary | ICD-10-CM | POA: Insufficient documentation

## 2012-08-11 DIAGNOSIS — Z87891 Personal history of nicotine dependence: Secondary | ICD-10-CM | POA: Insufficient documentation

## 2012-08-11 HISTORY — DX: Arnold-Chiari syndrome without spina bifida or hydrocephalus: Q07.00

## 2012-08-11 LAB — CBC WITH DIFFERENTIAL/PLATELET
Basophils Absolute: 0 10*3/uL (ref 0.0–0.1)
Basophils Relative: 0 % (ref 0–1)
Eosinophils Absolute: 0 10*3/uL (ref 0.0–0.7)
Eosinophils Relative: 0 % (ref 0–5)
HCT: 34.1 % — ABNORMAL LOW (ref 36.0–46.0)
MCH: 28 pg (ref 26.0–34.0)
MCHC: 33.4 g/dL (ref 30.0–36.0)
MCV: 83.8 fL (ref 78.0–100.0)
Monocytes Absolute: 0.4 10*3/uL (ref 0.1–1.0)
RDW: 15.4 % (ref 11.5–15.5)

## 2012-08-11 LAB — COMPREHENSIVE METABOLIC PANEL
AST: 14 U/L (ref 0–37)
Albumin: 3.5 g/dL (ref 3.5–5.2)
BUN: 21 mg/dL (ref 6–23)
Calcium: 9.8 mg/dL (ref 8.4–10.5)
Creatinine, Ser: 0.77 mg/dL (ref 0.50–1.10)

## 2012-08-11 NOTE — ED Notes (Signed)
PT. REPORTS NAUSEA WITH " HEAD PRESSURE' ONSET THIS EVENING , ALSO CONCERNED ABOUT HER BLOOD PRESSURE THIS EVENING AT HOME= 108/57. DENIES CHEST PAIN / RESPIRATIONS UNLABORED.

## 2012-08-12 ENCOUNTER — Emergency Department (HOSPITAL_COMMUNITY): Payer: Medicare Other

## 2012-08-12 NOTE — ED Notes (Signed)
Patient transported to CT 

## 2012-08-12 NOTE — ED Notes (Signed)
PT to ED c/o low bp (100/54) and 1 minute episode of "feeling as if things were going dark" while sitting on couch along with headache (which pt has been experiencing for years).  Sunday was funeral for pt's husband of 54 years and she has not been sleeping well since he passed.  VS stable, pt denies pain or changes in vision at this time.

## 2012-08-12 NOTE — ED Notes (Signed)
Pt back from CT.  Denies pain or vision changes.

## 2012-08-12 NOTE — ED Provider Notes (Addendum)
History     CSN: [id number]  Arrival date & time 08/11/12  2008   First MD Initiated Contact with Patient 08/11/12 2325      Chief Complaint  Patient presents with  . Nausea    (Consider location/radiation/quality/duration/timing/severity/associated sxs/prior treatment) Patient is a 71 y.o. female presenting with headaches. The history is provided by the patient.  Headache  This is a new problem. The current episode started 6 to 12 hours ago. The problem occurs constantly. The problem has been resolved. The pain is located in the bilateral region. The quality of the pain is described as dull. The pain is at a severity of 2/10. The pain is mild. The pain does not radiate. Associated symptoms include nausea. She has tried nothing for the symptoms.    Past Medical History  Diagnosis Date  . Asthma   . Reflux   . Hiatal hernia   . Peripheral vascular disease   . Hypertension   . Hyperlipidemia   . Iliac artery aneurysm   . Aortoiliac occlusive disease   . 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 Carotid artery occlusion   . Myocardial infarction 01/01/2000    Cardiac catheterization  . COPD (chronic obstructive pulmonary disease)   . Coronary artery disease   . Arnold-Chiari malformation 1998    Past Surgical History  Procedure Date  . Eye surgery   . Joint replacement     shoulder  . Coronary artery bypass graft 01/01/2000  . 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 Carotid endarterectomy 03/29/2010    Left  CEA  . Pr vein bypass graft,aorto-fem-pop   . Arnold-chiari malformation repair 1998    Family History  Problem Relation Age of Onset  . Heart disease Mother     Heart Disease before age 35  . Hypertension Mother   . Hyperlipidemia Mother   . Heart attack Mother   . Clotting disorder Mother   . Heart disease Father     Heart Disease before age 56  . Heart attack Father   . Heart disease Brother     Heart Disease before  age 74  . Hyperlipidemia Brother   . Hypertension Brother   . Clotting disorder Brother     History  Substance Use Topics  . Smoking status: Former Smoker    Types: Cigarettes    Quit date: 08/13/2000  . Smokeless tobacco: Not on file  . Alcohol Use: No    OB History    Grav Para Term Preterm Abortions TAB SAB Ect Mult Living                  Review of Systems  Gastrointestinal: Positive for nausea.  Neurological: Positive for headaches.  All other systems reviewed and are negative.    Allergies  Codeine; Oxycodone-acetaminophen; and Avelox  Home Medications   Current Outpatient Rx  Name  Route  Sig  Dispense  Refill  . ALBUTEROL SULFATE (2.5 MG/3ML) 0.083% IN NEBU   Nebulization   Take 2.5 mg by nebulization 2 (two) times daily.         Marland Kitchen AMLODIPINE-OLMESARTAN 10-40 MG PO TABS   Oral   Take 1 tablet by mouth daily.         Marland Kitchen CLOPIDOGREL BISULFATE 75 MG PO TABS   Oral   Take 1 tablet (75 mg total) by mouth daily.   90 tablet   3   . KAPIDEX  PO   Oral   Take 60 mg by mouth daily.         Marland Kitchen FLUTICASONE-SALMETEROL 250-50 MCG/DOSE IN AEPB   Inhalation   Inhale 1 puff into the lungs 2 (two) times daily.         Marland Kitchen HYDROCHLOROTHIAZIDE 12.5 MG PO CAPS   Oral   Take 1 tablet by mouth Daily.         Marland Kitchen LOSARTAN POTASSIUM 100 MG PO TABS   Oral   Take 100 mg by mouth daily.         Marland Kitchen NITROGLYCERIN 0.4 MG SL SUBL   Sublingual   Place 0.4 mg under the tongue every 5 (five) minutes x 2 doses as needed. For chest pain         . PREDNISONE 10 MG PO TABS   Oral   Take 10 mg by mouth daily.         Marland Kitchen ROSUVASTATIN CALCIUM 10 MG PO TABS   Oral   Take 10 mg by mouth daily.         . TUDORZA PRESSAIR 400 MCG/ACT IN AEPB   Oral   Take 1 puff by mouth 2 (two) times daily.            BP 169/56  Pulse 72  Temp 98.2 F (36.8 C) (Oral)  Resp 18  SpO2 100%  Physical Exam  Constitutional: She is oriented to person, place, and time. She  appears well-developed and well-nourished.  HENT:  Head: Normocephalic and atraumatic.  Eyes: Conjunctivae normal and EOM are normal. Pupils are equal, round, and reactive to light.  Neck: Normal range of motion.  Cardiovascular: Normal rate, regular rhythm and normal heart sounds.   Pulmonary/Chest: Effort normal and breath sounds normal.  Abdominal: Soft. Bowel sounds are normal.  Musculoskeletal: Normal range of motion.  Neurological: She is alert and oriented to person, place, and time. Cranial nerve deficit:    Skin: Skin is warm and dry.  Psychiatric: She has a normal mood and affect. Her behavior is normal.    ED Course  Procedures (including critical care time)  Labs Reviewed  CBC WITH DIFFERENTIAL - Abnormal; Notable for the following:    WBC 11.5 (*)     Hemoglobin 11.4 (*)     HCT 34.1 (*)     Neutrophils Relative 85 (*)     Neutro Abs 9.8 (*)     All other components within normal limits  COMPREHENSIVE METABOLIC PANEL - Abnormal; Notable for the following:    Glucose, Bld 164 (*)     Total Bilirubin 0.1 (*)     GFR calc non Af Amer 84 (*)     All other components within normal limits   No results found.   No diagnosis found.   Date: 08/12/2012  Rate: 65  Rhythm: normal sinus rhythm  QRS Axis: normal  Intervals: normal  ST/T Wave abnormalities: normal  Conduction Disutrbances: none  Narrative Interpretation: unremarkable     MDM  Pt with ha,  Nausea,  Resolved. States bp was initial low.  NO cp, no sob,  No focal weakness.  Does have hx of recent stress in life, and has been eating less.  Labs benign.  Will ct head,  reassess    Labs, ct neg.  Improved.  Will dc to [facility] ou[facility],  Ret new/worsening sxs    Risa Sylvia Ferne Reus, MD 08/12/12 0004  Ovide Dusek Ferne Reus, MD 08/12/12 [id number]

## 2012-11-06 ENCOUNTER — Other Ambulatory Visit (HOSPITAL_COMMUNITY): Payer: Self-pay | Admitting: Nurse Practitioner

## 2012-11-06 ENCOUNTER — Ambulatory Visit (HOSPITAL_COMMUNITY)
Admission: RE | Admit: 2012-11-06 | Discharge: 2012-11-06 | Disposition: A | Discharge status: Home / Self Care | Payer: Medicare Other | Source: Ambulatory Visit | Attending: Nurse Practitioner | Admitting: Nurse Practitioner

## 2012-11-06 DIAGNOSIS — R5383 Other fatigue: Secondary | ICD-10-CM

## 2012-11-06 DIAGNOSIS — R5381 Other malaise: Secondary | ICD-10-CM | POA: Insufficient documentation

## 2012-11-06 DIAGNOSIS — R05 Cough: Secondary | ICD-10-CM | POA: Insufficient documentation

## 2012-11-06 DIAGNOSIS — R0602 Shortness of breath: Secondary | ICD-10-CM | POA: Insufficient documentation

## 2012-11-06 DIAGNOSIS — Z951 Presence of aortocoronary bypass graft: Secondary | ICD-10-CM | POA: Insufficient documentation

## 2012-11-06 DIAGNOSIS — Z87891 Personal history of nicotine dependence: Secondary | ICD-10-CM | POA: Insufficient documentation

## 2012-11-06 DIAGNOSIS — R059 Cough, unspecified: Secondary | ICD-10-CM | POA: Insufficient documentation

## 2012-11-06 DIAGNOSIS — J45909 Unspecified asthma, uncomplicated: Secondary | ICD-10-CM | POA: Insufficient documentation

## 2012-11-14 ENCOUNTER — Institutional Professional Consult (permissible substitution): Payer: Medicare Other | Admitting: Internal Medicine

## 2012-11-17 ENCOUNTER — Telehealth: Payer: Self-pay | Admitting: Internal Medicine

## 2012-11-17 ENCOUNTER — Encounter: Payer: Self-pay | Admitting: Internal Medicine

## 2012-11-17 ENCOUNTER — Ambulatory Visit (INDEPENDENT_AMBULATORY_CARE_PROVIDER_SITE_OTHER): Payer: Medicare Other | Admitting: Internal Medicine

## 2012-11-17 VITALS — BP 128/68 | HR 69 | Temp 97.5°F | Ht 62.0 in | Wt 173.2 lb

## 2012-11-17 DIAGNOSIS — J4489 Other specified chronic obstructive pulmonary disease: Secondary | ICD-10-CM

## 2012-11-17 DIAGNOSIS — R05 Cough: Secondary | ICD-10-CM | POA: Insufficient documentation

## 2012-11-17 DIAGNOSIS — J449 Chronic obstructive pulmonary disease, unspecified: Secondary | ICD-10-CM

## 2012-11-17 DIAGNOSIS — R059 Cough, unspecified: Secondary | ICD-10-CM

## 2012-11-17 MED ORDER — BUDESONIDE 0.25 MG/2ML IN SUSP: RESPIRATORY_TRACT | Status: DC

## 2012-11-17 MED ORDER — PREDNISONE 10 MG PO TABS: ORAL_TABLET | ORAL | Status: DC

## 2012-11-17 MED ORDER — FORMOTEROL FUMARATE 20 MCG/2ML IN NEBU: 20.0000 ug | INHALATION_SOLUTION | Freq: Two times a day (BID) | RESPIRATORY_TRACT | Status: DC

## 2012-11-17 MED ORDER — DEXLANSOPRAZOLE 60 MG PO CPDR: 60.0000 mg | DELAYED_RELEASE_CAPSULE | Freq: Every day | ORAL | Status: DC

## 2012-11-17 NOTE — Patient Instructions (Addendum)
Please see patient coordinator before you leave today  to schedule sinus ct and get your CME company to provide you with perforimist and budesonide  Stop advair and tudorza  Only use your albuterol(ventolin is puffer, also ok to use nebulized albuterol as a rescue medication to be used if you can't catch your breath by resting or doing a relaxed purse lip breathing pattern. The less you use it, the better it will work when you need it.   Prednisone 10 mg take  4 each am x 2 days,   2 each am x 2 days,  1 each am x2days and stop   For cough mucinex 1200 mg every 12 hours   Dexilant 60 mg Take 30-60 min before first meal of the day and pepcid 20 mg one at bedtime  GERD (REFLUX)  is an extremely common cause of respiratory symptoms, many times with no significant heartburn at all.    It can be treated with medication, but also with lifestyle changes including avoidance of late meals, excessive alcohol, smoking cessation, and avoid fatty foods, chocolate, peppermint, colas, red wine, and acidic juices such as orange juice.  NO MINT OR MENTHOL PRODUCTS SO NO COUGH DROPS  USE SUGARLESS CANDY INSTEAD (jolley ranchers or Stover's)  NO OIL BASED VITAMINS - use powdered substitutes.    See Tammy NP w/in 2 weeks with all your medications, even over the counter meds, separated in two separate bags, the ones you take no matter what vs the ones you stop once you feel better and take only as needed when you feel you need them.   Tammy  will generate for you a new user friendly medication calendar that will put [place] all on the same page re: your medication use.     Without this process, it simply isn't possible to assure that we are providing  your outpatient care  with  the attention to detail we feel you deserve.   If we cannot assure that you're getting that kind of care,  then we cannot manage your problem effectively from this clinic.  Once you have seen Tammy and we are sure that we're all on the same  page with your medication use she will arrange follow up with me.

## 2012-11-17 NOTE — Telephone Encounter (Signed)
Spoke with pt She is needing pred taper and dexilant sent to pharm  Meds were on her AVS to take, but nothing was sent so I have now sent rxs to pharmacy  Nothing further needed per pt

## 2012-11-17 NOTE — Progress Notes (Signed)
  Subjective:    Patient ID: [name], female    DOB: April 27, 1941, MRN: [id number]  HPI  25 yowf quit smoking at heart surgery 2002 short of breath since using advair then [place] and much worse since Feb 2014 referred 11/17/2012 by  Eldridge Abrahams for copd eval  11/17/2012 Pulmonary ov cc sob x 12 years progressively worse even on advair and turdorza and freq use of nebulizer with best days has to lean on cart to do a grocery store and use [facility] parking but no need for 02 then much worse since Feb 2104 much worse sob assoc clogged up head and lots of cough and not better with neb,  No better p prednisone.  Was on nexium x 12 y then changed to dexilant.  No obvious daytime variabilty or assoc chronic cough or cp or chest tightness, subjective wheeze overt sinus or hb symptoms. No unusual exp hx or h/o childhood pna/ asthma or premature birth to her knowledge.   Sleeping ok without nocturnal  or early am exacerbation  of respiratory  c/o's or need for noct saba. Also denies any obvious fluctuation of symptoms with weather or environmental changes or other aggravating or alleviating factors except as outlined above    Review of Systems  Constitutional: Negative for fever, chills and unexpected weight change.  HENT: Positive for sneezing. Negative for ear pain, nosebleeds, congestion, sore throat, rhinorrhea, trouble swallowing, dental problem, voice change, postnasal drip and sinus pressure.   Eyes: Negative for visual disturbance.  Respiratory: Positive for cough and shortness of breath. Negative for choking.   Cardiovascular: Negative for chest pain and leg swelling.  Gastrointestinal: Negative for vomiting, abdominal pain and diarrhea.  Genitourinary: Negative for difficulty urinating.  Musculoskeletal: Negative for arthralgias.  Skin: Negative for rash.  Neurological: Negative for tremors, syncope and headaches.  Hematological: Does not bruise/bleed easily.       Objective:   Physical  Exam  Wt Readings from Last 3 Encounters:  11/17/12 173 lb 3.2 oz (78.563 kg)  12/17/11 173 lb 8 oz (78.699 kg)   HEENT mild turbinate edema.  Oropharynx no thrush or excess pnd or cobblestoning.  No JVD or cervical adenopathy. Mild accessory muscle hypertrophy. Trachea midline, nl thryroid. Chest was hyperinflated by percussion with diminished breath sounds and moderate increased exp time without wheeze. Hoover sign positive at mid inspiration. Regular rate and rhythm without murmur gallop or rub or increase P2 or edema.  Abd: no hsm, nl excursion. Ext warm without cyanosis or clubbing.    cxr 11/06/12 No acute or active cardiopulmonary or pleural abnormalities are  identified. Chronic findings are stable and are detailed above.       Assessment & Plan:

## 2012-11-18 NOTE — Assessment & Plan Note (Addendum)
DDX of  difficult airways managment all start with A and  include Adherence, Ace Inhibitors, Acid Reflux, Active Sinus Disease, Alpha 1 Antitripsin deficiency, Anxiety masquerading as Airways dz,  ABPA,  allergy(esp in young), Aspiration (esp in elderly), Adverse effects of DPI,  Active smokers, plus two Bs  = Bronchiectasis and Beta blocker use..and one C= CHF  Adherence is always the initial "prime suspect" and is a multilayered concern that requires a "trust but verify" approach in every patient - starting with knowing how to use medications, especially inhalers, correctly, keeping up with refills and understanding the fundamental difference between maintenance and prns vs those medications only taken for a very short course and then stopped and not refilled. The proper method of use, as well as anticipated side effects, of a metered-dose inhaler are discussed and demonstrated to the patient. Improved effectiveness after extensive coaching during this visit to a level of approximately  10% so try change to perforomist / bud bid   ? Acid reflux > continue rx  ? Component chf > note onset of symptoms p heart problems in 2002 but no obvious chf at present  Pt struggling with multiple meds/  To keep things simple, I have asked the patient to first separate medicines that are perceived as maintenance, that is to be taken daily "no matter what", from those medicines that are taken on only on an as-needed basis and I have given the patient examples of both, and then return to see our NP to generate a  detailed  medication calendar which should be followed until the next physician sees the patient and updates it.

## 2012-11-19 ENCOUNTER — Ambulatory Visit (INDEPENDENT_AMBULATORY_CARE_PROVIDER_SITE_OTHER)
Admission: RE | Admit: 2012-11-19 | Discharge: 2012-11-19 | Disposition: A | Discharge status: Home / Self Care | Payer: Medicare Other | Source: Ambulatory Visit | Attending: Internal Medicine | Admitting: Internal Medicine

## 2012-11-19 ENCOUNTER — Encounter: Payer: Self-pay | Admitting: Internal Medicine

## 2012-11-19 ENCOUNTER — Other Ambulatory Visit: Payer: Self-pay | Admitting: Internal Medicine

## 2012-11-19 DIAGNOSIS — R059 Cough, unspecified: Secondary | ICD-10-CM

## 2012-11-19 DIAGNOSIS — R05 Cough: Secondary | ICD-10-CM

## 2012-11-19 MED ORDER — AMOXICILLIN-POT CLAVULANATE 875-125 MG PO TABS: 1.0000 | ORAL_TABLET | Freq: Two times a day (BID) | ORAL | Status: DC

## 2012-11-24 ENCOUNTER — Telehealth: Payer: Self-pay | Admitting: Internal Medicine

## 2012-11-24 NOTE — Telephone Encounter (Signed)
[id number] x1 

## 2012-11-25 NOTE — Telephone Encounter (Signed)
Pt returned call. [name]  

## 2012-11-25 NOTE — Telephone Encounter (Signed)
Pt returned call. Emily E McAlister °

## 2012-11-25 NOTE — Telephone Encounter (Signed)
[id number] x1 

## 2012-11-25 NOTE — Telephone Encounter (Signed)
Try just the budesonide and don't take the augmentin unless mucus turns dark and return to ov in 2 weeks to regroup,sooner if needed

## 2012-11-25 NOTE — Telephone Encounter (Signed)
I spoke with pt and is aware. She voiced her understanding and hade no questions

## 2012-11-25 NOTE — Telephone Encounter (Signed)
Pt reports that 2-3 days after starting Augmentin and Neb meds (Performist & Budesonide) pt started started aching in chest.  Pt stopped all these meds and ache is gone.  Pt reports that cough is some better and SOB is about the same.  Pt did finish the Prednisone.  Please advise on what pt should do about meds.

## 2012-12-02 ENCOUNTER — Encounter: Payer: Self-pay | Admitting: Adult Health

## 2012-12-02 ENCOUNTER — Ambulatory Visit (INDEPENDENT_AMBULATORY_CARE_PROVIDER_SITE_OTHER): Payer: Medicare Other | Admitting: Adult Health

## 2012-12-02 VITALS — BP 114/60 | HR 66 | Temp 97.5°F | Ht 62.0 in | Wt 173.8 lb

## 2012-12-02 DIAGNOSIS — R059 Cough, unspecified: Secondary | ICD-10-CM

## 2012-12-02 DIAGNOSIS — R05 Cough: Secondary | ICD-10-CM

## 2012-12-02 DIAGNOSIS — J449 Chronic obstructive pulmonary disease, unspecified: Secondary | ICD-10-CM

## 2012-12-02 NOTE — Progress Notes (Signed)
  Subjective:    Patient ID: [name], female    DOB: October 04, 1940, MRN: [id number]  HPI  89 yowf quit smoking at heart surgery 2002 short of breath since using advair then [place] and much worse since Feb 2014 referred 11/17/2012 by  [name] for copd eval  11/17/2012 Pulmonary ov cc sob x 12 years progressively worse even on advair and turdorza and freq use of nebulizer with best days has to lean on cart to do a grocery store and use [facility] parking but no need for 02 then much worse since Feb 2104 much worse sob assoc clogged up head and lots of cough and not better with neb,  No better p prednisone.  Was on nexium x 12 y then changed to dexilant. >changed to perforomist/budesonide neb, advair and tudorze stopped. , CT sinus with inflammatory dz noted, rx augmentin.    12/02/2012 Follow up and med review  Patient returns for a two-week followup and medication review. We reviewed all her medications and organized them into a medication calendar with patient education. It appears the patient had several medication changes. Recently and several intolerances. Patient was changed over from Advair and Tudorza to budesonide and fell in his nebulizer.Marland Kitchen She was also given Augmentin for a possible sinus infection. CT of the sinuses showed significant sinus inflammatory disease. Personal. Patient reports that she could not tolerate any of these medications and stopped  all of the meds .  She is feeling better, no discolored mucus or fever.  We discussed restarting meds slowly and check to see if she could tolerate this more effectively.  She denies any hemoptysis, orthopnea, PND, or leg swelling.    Review of Systems  Constitutional: Negative for fever, chills and unexpected weight change.  HENT: Positive for sneezing. Negative for ear pain, nosebleeds, congestion, sore throat, rhinorrhea, trouble swallowing, dental problem, voice change, postnasal drip and sinus pressure.   Eyes: Negative for visual  disturbance.  Respiratory: Positive for cough and shortness of breath. Negative for choking.   Cardiovascular: Negative for chest pain and leg swelling.  Gastrointestinal: Negative for vomiting, abdominal pain and diarrhea.  Genitourinary: Negative for difficulty urinating.  Musculoskeletal: Negative for arthralgias.  Skin: Negative for rash.  Neurological: Negative for tremors, syncope and headaches.  Hematological: Does not bruise/bleed easily.       Objective:   Physical Exam  HEENT mild turbinate edema.  Oropharynx no thrush or excess pnd or cobblestoning.  No JVD or cervical adenopathy. Mild accessory muscle hypertrophy. Trachea midline, nl thryroid. Chest diminished BS  Regular rate and rhythm without murmur gallop or rub or increase P2 or edema.  Abd: no hsm, nl excursion. Ext warm without cyanosis or clubbing.    cxr 11/06/12 No acute or active cardiopulmonary or pleural abnormalities are  identified. Chronic findings are stable and are detailed above.  11/19/12 CT sinus >The patient does show evidence of sinus inflammatory disease of a  mild to moderate degree affecting the ethmoid, maxillary and  sphenoid sinuses      Assessment & Plan:

## 2012-12-02 NOTE — Patient Instructions (Addendum)
Restart Budesonide Neb Twice daily   Remain off Advair, Tudorza and Perforomist.  Follow med calendar closely and bring to each visit.  follow up Dr. Melvyn Novas  In 2-3 weeks and As needed

## 2012-12-03 NOTE — Assessment & Plan Note (Signed)
Patient's medications were reviewed today and patient education was given. Computerized medication calendar was adjusted/completed  Will restart Budesonide Neb Twice daily   Then on return is tolerating , consider restarting Perforomist. Twice daily    Plan  Restart Budesonide Neb Twice daily   Remain off Advair, Tudorza and Perforomist.  Follow med calendar closely and bring to each visit.  follow up Dr. Melvyn Novas  In 2-3 weeks and As needed

## 2012-12-08 NOTE — Addendum Note (Signed)
Addended by: Parke Poisson E on: 12/08/2012 03:13 PM   Modules accepted: Orders, Medications

## 2012-12-10 ENCOUNTER — Other Ambulatory Visit (HOSPITAL_COMMUNITY): Payer: Self-pay | Admitting: Family Medicine

## 2012-12-10 DIAGNOSIS — Z1231 Encounter for screening mammogram for malignant neoplasm of breast: Secondary | ICD-10-CM

## 2012-12-17 ENCOUNTER — Ambulatory Visit: Payer: Medicare Other | Admitting: Neurosurgery

## 2012-12-17 ENCOUNTER — Other Ambulatory Visit: Payer: Medicare Other

## 2012-12-18 ENCOUNTER — Ambulatory Visit: Payer: Medicare Other | Admitting: Internal Medicine

## 2012-12-18 ENCOUNTER — Ambulatory Visit (HOSPITAL_COMMUNITY)
Admission: RE | Admit: 2012-12-18 | Discharge: 2012-12-18 | Disposition: A | Discharge status: Home / Self Care | Payer: Medicare Other | Source: Ambulatory Visit | Attending: Family Medicine | Admitting: Family Medicine

## 2012-12-18 DIAGNOSIS — Z1231 Encounter for screening mammogram for malignant neoplasm of breast: Secondary | ICD-10-CM | POA: Insufficient documentation

## 2012-12-24 ENCOUNTER — Ambulatory Visit (INDEPENDENT_AMBULATORY_CARE_PROVIDER_SITE_OTHER): Payer: Medicare Other | Admitting: Internal Medicine

## 2012-12-24 ENCOUNTER — Encounter: Payer: Self-pay | Admitting: Internal Medicine

## 2012-12-24 VITALS — BP 140/64 | HR 63 | Temp 97.9°F | Ht 62.5 in | Wt 171.8 lb

## 2012-12-24 DIAGNOSIS — R059 Cough, unspecified: Secondary | ICD-10-CM

## 2012-12-24 DIAGNOSIS — R05 Cough: Secondary | ICD-10-CM

## 2012-12-24 DIAGNOSIS — J449 Chronic obstructive pulmonary disease, unspecified: Secondary | ICD-10-CM

## 2012-12-24 NOTE — Patient Instructions (Addendum)
Perforomist one half daily with  am Budesonide as per your calendar to see if it helps your walking to mailbox and back or not  Please schedule a follow up visit in 3 months but call sooner if needed  Add needs pfts on return

## 2012-12-24 NOTE — Progress Notes (Signed)
Subjective:    Patient ID: [name], female    DOB: 1940-10-19, MRN: [id number]  HPI  6 yowf quit smoking at heart surgery 2002 short of breath since using advair then [place] and much worse since Feb 2014 referred 11/17/2012 by  Eldridge Abrahams for copd eval with GOLD II severity 2013  11/17/2012 Pulmonary ov cc sob x 12 years progressively worse even on advair and turdorza and freq use of nebulizer with best days has to lean on cart to do a grocery store and use [facility] parking but no need for 02 then much worse since Feb 2104 much worse sob assoc clogged up head and lots of cough and not better with neb,  No better p prednisone.  Was on nexium x 12 y then changed to dexilant. >changed to perforomist/budesonide neb, advair and tudorze stopped. , CT sinus with inflammatory dz noted, rx augmentin.    12/02/2012 Follow up and med review  Patient returns for a two-week followup and medication review. We reviewed all her medications and organized them into a medication calendar with patient education. It appears the patient had several medication changes. Recently and several intolerances. Patient was changed over from Advair and Tudorza to budesonide and fell in his nebulizer.Marland Kitchen She was also given Augmentin for a possible sinus infection. CT of the sinuses showed significant sinus inflammatory disease. Personal. Patient reports that she could not tolerate any of these medications and stopped  all of the meds .  She is feeling better, no discolored mucus or fever.  We discussed restarting meds slowly and check to see if she could tolerate this more effectively.   rec Restart Budesonide Neb Twice daily   Remain off Advair, Tudorza and Perforomist.    12/24/2012 f/u ov/[name] re copd Chief Complaint  Patient presents with  . Follow-up    Pt states that her cough has improved since last visit. She states breathing seems worse and having chest pressure x 2 wks.   doe x mailbox daily, some better p saba  which also relieves chest pressure w/in 5 min  No obvious daytime variabilty or assoc chronic cough or cp or chest tightness, subjective wheeze overt sinus or hb symptoms. No unusual exp hx or h/o childhood pna/ asthma or premature birth to her knowledge.   Sleeping ok without nocturnal  or early am exacerbation  of respiratory  c/o's or need for noct saba. Also denies any obvious fluctuation of symptoms with weather or environmental changes or other aggravating or alleviating factors except as outlined above   Current Medications, Allergies, Past Medical History, Past Surgical History, Family History, and Social History were reviewed in [employer] record.  ROS  The following are not active complaints unless bolded sore throat, dysphagia, dental problems, itching, sneezing,  nasal congestion or excess/ purulent secretions, ear ache,   fever, chills, sweats, unintended wt loss, pleuritic or exertional cp, hemoptysis,  orthopnea pnd or leg swelling, presyncope, palpitations, heartburn, abdominal pain, anorexia, nausea, vomiting, diarrhea  or change in bowel or urinary habits, change in stools or urine, dysuria,hematuria,  rash, arthralgias, visual complaints, headache, numbness weakness or ataxia or problems with walking or coordination,  change in mood/affect or memory.              Objective:   Physical Exam  HEENT mild turbinate edema.  Oropharynx no thrush or excess pnd or cobblestoning.  No JVD or cervical adenopathy. Mild accessory muscle hypertrophy. Trachea midline, nl thryroid. Chest diminished  BS  Regular rate and rhythm without murmur gallop or rub or increase P2 or edema.  Abd: no hsm, nl excursion. Ext warm without cyanosis or clubbing.    cxr 11/06/12 No acute or active cardiopulmonary or pleural abnormalities are  identified. Chronic findings are stable and are detailed above.  11/19/12 CT sinus >The patient does show evidence of sinus inflammatory disease  of a  mild to moderate degree affecting the ethmoid, maxillary and  sphenoid sinuses      Assessment & Plan:

## 2012-12-25 NOTE — Assessment & Plan Note (Signed)
-   hfa < 10 % despite ext coaching 11/17/12 > change to perforomist PFT 08/2011 >FEV1 1.30 L(58%), ratio 65 , no sign change with BD .  Mid flow w/  +reversibility. DLCO 74%.   The cough is clearly better off dpi but the breathing is worse off LABA so rec rechallenge with half dose of perforomist qam only  I had an extended discussion with the patient today lasting 15 to 20 minutes of a 25 minute visit on the following issues:     Each maintenance medication was reviewed in detail including most importantly the difference between maintenance and as needed and under what circumstances the prns are to be used. This was done in the context of a medication calendar review which provided the patient with a user-friendly unambiguous mechanism for medication administration and reconciliation and provides an action plan for all active problems. It is critical that this be shown to every doctor  for modification during the office visit if necessary so the patient can use it as a working document.

## 2012-12-25 NOTE — Assessment & Plan Note (Signed)
Sinus CT 11/19/2012  >> evidence of sinus inflammatory disease of a mild to moderate degree affecting the ethmoid, maxillary and sphenoid sinuses. The sinuses were clear in December. >Med calendar 12/02/12  - resolved to her satisfaction on gerd rx and off dpi's > no change rx for now

## 2013-01-06 ENCOUNTER — Other Ambulatory Visit: Payer: Self-pay | Admitting: *Deleted

## 2013-01-06 ENCOUNTER — Encounter: Payer: Self-pay | Admitting: Vascular Surgery

## 2013-01-06 DIAGNOSIS — Z48812 Encounter for surgical aftercare following surgery on the circulatory system: Secondary | ICD-10-CM

## 2013-01-06 DIAGNOSIS — I739 Peripheral vascular disease, unspecified: Secondary | ICD-10-CM

## 2013-01-07 ENCOUNTER — Ambulatory Visit (INDEPENDENT_AMBULATORY_CARE_PROVIDER_SITE_OTHER): Payer: Medicare Other | Admitting: Vascular Surgery

## 2013-01-07 ENCOUNTER — Other Ambulatory Visit: Payer: Self-pay | Admitting: *Deleted

## 2013-01-07 ENCOUNTER — Other Ambulatory Visit (INDEPENDENT_AMBULATORY_CARE_PROVIDER_SITE_OTHER): Payer: Medicare Other | Admitting: *Deleted

## 2013-01-07 ENCOUNTER — Encounter: Payer: Self-pay | Admitting: Vascular Surgery

## 2013-01-07 ENCOUNTER — Encounter (INDEPENDENT_AMBULATORY_CARE_PROVIDER_SITE_OTHER): Payer: Medicare Other | Admitting: *Deleted

## 2013-01-07 DIAGNOSIS — R0609 Other forms of dyspnea: Secondary | ICD-10-CM | POA: Insufficient documentation

## 2013-01-07 DIAGNOSIS — I739 Peripheral vascular disease, unspecified: Secondary | ICD-10-CM

## 2013-01-07 DIAGNOSIS — Z48812 Encounter for surgical aftercare following surgery on the circulatory system: Secondary | ICD-10-CM

## 2013-01-07 DIAGNOSIS — I6529 Occlusion and stenosis of unspecified carotid artery: Secondary | ICD-10-CM

## 2013-01-07 DIAGNOSIS — R42 Dizziness and giddiness: Secondary | ICD-10-CM

## 2013-01-07 DIAGNOSIS — I779 Disorder of arteries and arterioles, unspecified: Secondary | ICD-10-CM | POA: Insufficient documentation

## 2013-01-07 DIAGNOSIS — R0602 Shortness of breath: Secondary | ICD-10-CM

## 2013-01-07 HISTORY — DX: Disorder of arteries and arterioles, unspecified: I77.9

## 2013-01-07 NOTE — Progress Notes (Signed)
[facility] [place]  Patient name: [name] MRN: [id number] DOB: 1941-08-12 Sex: female  REASON FOR VISIT: follow up of carotid disease  HPI: [name] is a 72 y.o. female who underwent a left carotid endarterectomy in August of 2011. We have been following a moderate right carotid stenosis. She comes in for a yearly follow up visit. Since I saw her last, she denies any history of stroke, TIAs, expressive or receptive aphasia, or amaurosis fugax. She has complained of some dizziness. She underwent a 2-D echo which apparently showed some valvular insufficiency but she did not know the details. Her blood pressure and cholesterol been under good control. She has no significant claudication and denies rest pain.  Past Medical History  Diagnosis Date  . Asthma   . Reflux   . Hiatal hernia   . Peripheral vascular disease   . Hypertension   . Hyperlipidemia   . Iliac artery aneurysm   . Aortoiliac occlusive disease   . 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 Carotid artery occlusion   . Myocardial infarction 01/01/2000    Cardiac catheterization  . COPD (chronic obstructive pulmonary disease)   . Coronary artery disease   . Arnold-Chiari malformation 1998   Family History  Problem Relation Age of Onset  . Heart disease Mother     Heart Disease before age 11  . Hypertension Mother   . Hyperlipidemia Mother   . Heart attack Mother   . Clotting disorder Mother   . Heart disease Father     Heart Disease before age 7  . Heart attack Father   . Heart disease Brother     Heart Disease before age 21  . Hyperlipidemia Brother   . Hypertension Brother   . Clotting disorder Brother    SOCIAL HISTORY: History  Substance Use Topics  . Smoking status: Former Smoker -- 1.00 packs/day for 30 years    Types: Cigarettes    Quit date: 08/13/2000  . Smokeless tobacco: Never Used  . Alcohol Use: No   Allergies  Allergen Reactions  . Codeine Other (See  Comments)    Dr. Terrence Dupont advised patient not to take this medication  . Oxycodone-Acetaminophen   . Avelox (Moxifloxacin Hcl In Nacl) Palpitations   Current Outpatient Prescriptions  Medication Sig Dispense Refill  . albuterol (PROVENTIL) (2.5 MG/3ML) 0.083% nebulizer solution Take 2.5 mg by nebulization every 4 (four) hours as needed for wheezing or shortness of breath (((PLAN C))).       . albuterol (VENTOLIN HFA) 108 (90 BASE) MCG/ACT inhaler Inhale 2 puffs into the lungs every 4 (four) hours as needed for wheezing or shortness of breath (((PLAN B))).       Marland Kitchen amLODipine-olmesartan (AZOR) 10-40 MG per tablet Take 1 tablet by mouth daily.      . budesonide (PULMICORT) 0.25 MG/2ML nebulizer solution One twice daily with perforomist  120 mL  12  . cetirizine (ZYRTEC) 1 MG/ML syrup Take 1 mg by mouth daily.      . clopidogrel (PLAVIX) 75 MG tablet Take 1 tablet (75 mg total) by mouth daily.  90 tablet  3  . dexlansoprazole (DEXILANT) 60 MG capsule Take 1 capsule (60 mg total) by mouth daily before breakfast.  30 capsule  11  . dextromethorphan-guaiFENesin (MUCINEX DM) 30-600 MG per 12 hr tablet Take 1 tablet by mouth continuous as needed.       . formoterol (PERFOROMIST) 20 MCG/2ML nebulizer solution Take  20 mcg by nebulization daily. Half dose per day.      . hydrochlorothiazide (MICROZIDE) 12.5 MG capsule Take 1 tablet by mouth Daily.      . nitroGLYCERIN (NITROSTAT) 0.4 MG SL tablet Place 0.4 mg under the tongue continuous as needed for chest pain.       . rosuvastatin (CRESTOR) 10 MG tablet Take 10 mg by mouth at bedtime.        No current facility-administered medications for this visit.   REVIEW OF SYSTEMS: Valu.Nieves ] denotes positive finding; [  ] denotes negative finding  CARDIOVASCULAR:  [ ]  chest pain   Valu.Nieves ] chest pressure   [ ]  palpitations   Valu.Nieves ] orthopnea   Valu.Nieves ] dyspnea on exertion   [ ]  claudication   [ ]  rest pain   [ ]  DVT   [ ]  phlebitis PULMONARY:   Valu.Nieves ] productive cough   Valu.Nieves ]  asthma   Valu.Nieves ] wheezing NEUROLOGIC:   [ ]  weakness  [ ]  paresthesias  [ ]  aphasia  [ ]  amaurosis  Valu.Nieves ] dizziness HEMATOLOGIC:   [ ]  bleeding problems   [ ]  clotting disorders MUSCULOSKELETAL:  [ ]  joint pain   [ ]  joint swelling [ ]  leg swelling GASTROINTESTINAL: [ ]   blood in stool  [ ]   hematemesis GENITOURINARY:  [ ]   dysuria  [ ]   hematuria PSYCHIATRIC:  [ ]  history of major depression INTEGUMENTARY:  [ ]  rashes  [ ]  ulcers CONSTITUTIONAL:  [ ]  fever   Valu.Nieves ] chills  PHYSICAL EXAM: Filed Vitals:   01/07/13 0943 01/07/13 0945  BP: 136/66 135/71  Pulse: 60 62  Resp: 16   Height: 5' 2.5" (1.588 m)   Weight: 169 lb (76.658 kg)   SpO2: 98%    Body mass index is 30.4 kg/(m^2). GENERAL: The patient is a well-nourished female, in no acute distress. The vital signs are documented above. CARDIOVASCULAR: There is a regular rate and rhythm. I do not detect carotid bruits. PULMONARY: There is good air exchange bilaterally without wheezing or rales. ABDOMEN: Soft and non-tender with normal pitched bowel sounds.  MUSCULOSKELETAL: There are no major deformities or cyanosis. NEUROLOGIC: No focal weakness or paresthesias are detected. SKIN: There are no ulcers or rashes noted. PSYCHIATRIC: The patient has a normal affect.  DATA:  I have independently interpreted her carotid duplex scan. Her left carotid endarterectomy site is widely patent. She has a 40-59% right carotid stenosis. Both vertebral arteries are patent with normally directed flow.  I have also independently interpreted her arterial lower extremity Doppler study. She has an ABI of 95% on the right and 93% on the left.  MEDICAL ISSUES:  Occlusion and stenosis of carotid artery without mention of cerebral infarction Her left carotid endarterectomy site is widely patent. She has a moderate 40-59% right carotid stenosis which is asymptomatic. I have recommended a follow up carotid duplex scan in 1 year and I'll see her back at that time.  She remains on Plavix. She is not a smoker. With respect to her dizziness, both vertebral arteries are patent with normally directed flow and I do not think that she has any evidence of vertebral basilar insufficiency.   [place] [facility] [place] Beeper: [phone]

## 2013-01-07 NOTE — Assessment & Plan Note (Signed)
Her left carotid endarterectomy site is widely patent. She has a moderate 40-59% right carotid stenosis which is asymptomatic. I have recommended a follow up carotid duplex scan in 1 year and I'll see her back at that time. She remains on Plavix. She is not a smoker. With respect to her dizziness, both vertebral arteries are patent with normally directed flow and I do not think that she has any evidence of vertebral basilar insufficiency.

## 2013-01-09 ENCOUNTER — Other Ambulatory Visit (HOSPITAL_COMMUNITY): Payer: Self-pay | Admitting: Cardiology

## 2013-01-09 DIAGNOSIS — R079 Chest pain, unspecified: Secondary | ICD-10-CM

## 2013-01-12 ENCOUNTER — Encounter (HOSPITAL_COMMUNITY)
Admission: RE | Admit: 2013-01-12 | Discharge: 2013-01-12 | Disposition: A | Discharge status: Home / Self Care | Payer: Medicare Other | Source: Ambulatory Visit | Attending: Cardiology | Admitting: Cardiology

## 2013-01-12 DIAGNOSIS — R079 Chest pain, unspecified: Secondary | ICD-10-CM | POA: Insufficient documentation

## 2013-01-12 DIAGNOSIS — I4949 Other premature depolarization: Secondary | ICD-10-CM | POA: Insufficient documentation

## 2013-01-12 DIAGNOSIS — I2510 Atherosclerotic heart disease of native coronary artery without angina pectoris: Secondary | ICD-10-CM | POA: Insufficient documentation

## 2013-01-12 DIAGNOSIS — Z951 Presence of aortocoronary bypass graft: Secondary | ICD-10-CM | POA: Insufficient documentation

## 2013-01-12 DIAGNOSIS — I491 Atrial premature depolarization: Secondary | ICD-10-CM | POA: Insufficient documentation

## 2013-01-12 DIAGNOSIS — R0602 Shortness of breath: Secondary | ICD-10-CM | POA: Insufficient documentation

## 2013-01-12 DIAGNOSIS — I10 Essential (primary) hypertension: Secondary | ICD-10-CM | POA: Insufficient documentation

## 2013-01-12 MED ORDER — REGADENOSON 0.4 MG/5ML IV SOLN
INTRAVENOUS | Status: AC
  Administered 2013-01-12: 0.4 mg via INTRAVENOUS
  Filled 2013-01-12: qty 5

## 2013-01-12 MED ORDER — TECHNETIUM TC 99M SESTAMIBI GENERIC - CARDIOLITE
10.0000 | Freq: Once | INTRAVENOUS | Status: AC | PRN
  Administered 2013-01-12: 10 via INTRAVENOUS

## 2013-01-12 MED ORDER — REGADENOSON 0.4 MG/5ML IV SOLN: 0.4000 mg | Freq: Once | INTRAVENOUS | Status: AC

## 2013-01-12 MED ORDER — TECHNETIUM TC 99M SESTAMIBI GENERIC - CARDIOLITE
30.0000 | Freq: Once | INTRAVENOUS | Status: AC | PRN
  Administered 2013-01-12: 30 via INTRAVENOUS

## 2013-03-30 ENCOUNTER — Ambulatory Visit: Payer: Medicare Other | Admitting: Internal Medicine

## 2013-04-01 ENCOUNTER — Emergency Department (HOSPITAL_COMMUNITY): Payer: Medicare Other

## 2013-04-01 ENCOUNTER — Inpatient Hospital Stay (HOSPITAL_COMMUNITY)
Admission: EM | Admit: 2013-04-01 | Discharge: 2013-04-06 | DRG: 372 | Disposition: A | Discharge status: Home / Self Care | Payer: Medicare Other | Attending: Internal Medicine | Admitting: Internal Medicine

## 2013-04-01 ENCOUNTER — Encounter (HOSPITAL_COMMUNITY): Payer: Self-pay | Admitting: Emergency Medicine

## 2013-04-01 DIAGNOSIS — J449 Chronic obstructive pulmonary disease, unspecified: Secondary | ICD-10-CM | POA: Diagnosis present

## 2013-04-01 DIAGNOSIS — K921 Melena: Secondary | ICD-10-CM | POA: Diagnosis not present

## 2013-04-01 DIAGNOSIS — I700 Atherosclerosis of aorta: Secondary | ICD-10-CM | POA: Diagnosis present

## 2013-04-01 DIAGNOSIS — Z951 Presence of aortocoronary bypass graft: Secondary | ICD-10-CM

## 2013-04-01 DIAGNOSIS — K648 Other hemorrhoids: Secondary | ICD-10-CM | POA: Diagnosis present

## 2013-04-01 DIAGNOSIS — I10 Essential (primary) hypertension: Secondary | ICD-10-CM | POA: Diagnosis present

## 2013-04-01 DIAGNOSIS — E876 Hypokalemia: Secondary | ICD-10-CM | POA: Diagnosis present

## 2013-04-01 DIAGNOSIS — E871 Hypo-osmolality and hyponatremia: Secondary | ICD-10-CM | POA: Diagnosis present

## 2013-04-01 DIAGNOSIS — R112 Nausea with vomiting, unspecified: Secondary | ICD-10-CM | POA: Insufficient documentation

## 2013-04-01 DIAGNOSIS — E860 Dehydration: Secondary | ICD-10-CM | POA: Diagnosis present

## 2013-04-01 DIAGNOSIS — I252 Old myocardial infarction: Secondary | ICD-10-CM

## 2013-04-01 DIAGNOSIS — R197 Diarrhea, unspecified: Secondary | ICD-10-CM | POA: Diagnosis present

## 2013-04-01 DIAGNOSIS — N179 Acute kidney failure, unspecified: Secondary | ICD-10-CM | POA: Diagnosis present

## 2013-04-01 DIAGNOSIS — I959 Hypotension, unspecified: Secondary | ICD-10-CM | POA: Diagnosis present

## 2013-04-01 DIAGNOSIS — E46 Unspecified protein-calorie malnutrition: Secondary | ICD-10-CM | POA: Diagnosis present

## 2013-04-01 DIAGNOSIS — Z79899 Other long term (current) drug therapy: Secondary | ICD-10-CM

## 2013-04-01 DIAGNOSIS — E785 Hyperlipidemia, unspecified: Secondary | ICD-10-CM | POA: Diagnosis present

## 2013-04-01 DIAGNOSIS — I739 Peripheral vascular disease, unspecified: Secondary | ICD-10-CM | POA: Diagnosis present

## 2013-04-01 DIAGNOSIS — A020 Salmonella enteritis: Principal | ICD-10-CM | POA: Diagnosis present

## 2013-04-01 DIAGNOSIS — J4489 Other specified chronic obstructive pulmonary disease: Secondary | ICD-10-CM | POA: Diagnosis present

## 2013-04-01 DIAGNOSIS — K219 Gastro-esophageal reflux disease without esophagitis: Secondary | ICD-10-CM | POA: Diagnosis present

## 2013-04-01 DIAGNOSIS — K449 Diaphragmatic hernia without obstruction or gangrene: Secondary | ICD-10-CM | POA: Diagnosis present

## 2013-04-01 DIAGNOSIS — I2510 Atherosclerotic heart disease of native coronary artery without angina pectoris: Secondary | ICD-10-CM | POA: Diagnosis present

## 2013-04-01 DIAGNOSIS — I6529 Occlusion and stenosis of unspecified carotid artery: Secondary | ICD-10-CM | POA: Diagnosis present

## 2013-04-01 LAB — CBC WITH DIFFERENTIAL/PLATELET
Basophils Absolute: 0 10*3/uL (ref 0.0–0.1)
Eosinophils Relative: 1 % (ref 0–5)
Lymphocytes Relative: 19 % (ref 12–46)
MCV: 81.6 fL (ref 78.0–100.0)
Neutrophils Relative %: 60 % (ref 43–77)
Platelets: 196 10*3/uL (ref 150–400)
RDW: 14.4 % (ref 11.5–15.5)
WBC: 7.6 10*3/uL (ref 4.0–10.5)

## 2013-04-01 LAB — COMPREHENSIVE METABOLIC PANEL
ALT: 10 U/L (ref 0–35)
AST: 20 U/L (ref 0–37)
CO2: 20 mEq/L (ref 19–32)
Calcium: 8.9 mg/dL (ref 8.4–10.5)
GFR calc non Af Amer: 14 mL/min — ABNORMAL LOW (ref >90)
Potassium: 3.2 mEq/L — ABNORMAL LOW (ref 3.5–5.1)
Sodium: 131 mEq/L — ABNORMAL LOW (ref 135–145)
Total Protein: 6.6 g/dL (ref 6.0–8.3)

## 2013-04-01 LAB — MRSA PCR SCREENING: MRSA by PCR: NEGATIVE

## 2013-04-01 LAB — TROPONIN I: Troponin I: <0.3 ng/mL (ref <0.30)

## 2013-04-01 LAB — OCCULT BLOOD, POC DEVICE: Fecal Occult Bld: POSITIVE — AB

## 2013-04-01 MED ORDER — ACETAMINOPHEN 325 MG PO TABS
650.0000 mg | ORAL_TABLET | Freq: Four times a day (QID) | ORAL | Status: DC | PRN
  Administered 2013-04-03 – 2013-04-04 (×3): 650 mg via ORAL
  Filled 2013-04-01 (×2): qty 2
  Filled 2013-04-01: qty 1

## 2013-04-01 MED ORDER — ONDANSETRON HCL 4 MG/2ML IJ SOLN: 4.0000 mg | Freq: Three times a day (TID) | INTRAMUSCULAR | Status: DC | PRN

## 2013-04-01 MED ORDER — ONDANSETRON HCL 4 MG PO TABS: 4.0000 mg | ORAL_TABLET | Freq: Four times a day (QID) | ORAL | Status: DC | PRN

## 2013-04-01 MED ORDER — SODIUM CHLORIDE 0.9 % IJ SOLN
3.0000 mL | Freq: Two times a day (BID) | INTRAMUSCULAR | Status: DC
  Administered 2013-04-02 – 2013-04-06 (×6): 3 mL via INTRAVENOUS

## 2013-04-01 MED ORDER — SODIUM CHLORIDE 0.9 % IV BOLUS (SEPSIS)
1000.0000 mL | Freq: Once | INTRAVENOUS | Status: AC
  Administered 2013-04-01: 1000 mL via INTRAVENOUS

## 2013-04-01 MED ORDER — PANTOPRAZOLE SODIUM 40 MG PO TBEC
40.0000 mg | DELAYED_RELEASE_TABLET | Freq: Every day | ORAL | Status: DC
  Administered 2013-04-01 – 2013-04-06 (×6): 40 mg via ORAL
  Filled 2013-04-01 (×7): qty 1

## 2013-04-01 MED ORDER — ONDANSETRON HCL 4 MG/2ML IJ SOLN
4.0000 mg | Freq: Once | INTRAMUSCULAR | Status: AC
  Administered 2013-04-01: 4 mg via INTRAVENOUS
  Filled 2013-04-01: qty 2

## 2013-04-01 MED ORDER — ALBUTEROL SULFATE HFA 108 (90 BASE) MCG/ACT IN AERS
2.0000 | INHALATION_SPRAY | RESPIRATORY_TRACT | Status: DC | PRN
  Filled 2013-04-01: qty 6.7

## 2013-04-01 MED ORDER — CLOPIDOGREL BISULFATE 75 MG PO TABS
75.0000 mg | ORAL_TABLET | Freq: Every day | ORAL | Status: DC
  Administered 2013-04-01 – 2013-04-06 (×6): 75 mg via ORAL
  Filled 2013-04-01 (×6): qty 1

## 2013-04-01 MED ORDER — POTASSIUM CHLORIDE IN NACL 20-0.9 MEQ/L-% IV SOLN
INTRAVENOUS | Status: DC
  Administered 2013-04-01 – 2013-04-04 (×6): via INTRAVENOUS
  Administered 2013-04-04 – 2013-04-06 (×2): 1 mL via INTRAVENOUS
  Filled 2013-04-01 (×14): qty 1000

## 2013-04-01 MED ORDER — BUDESONIDE 0.25 MG/2ML IN SUSP
0.2500 mg | Freq: Two times a day (BID) | RESPIRATORY_TRACT | Status: DC
  Administered 2013-04-01 – 2013-04-06 (×9): 0.25 mg via RESPIRATORY_TRACT
  Filled 2013-04-01 (×13): qty 2

## 2013-04-01 MED ORDER — ACETAMINOPHEN 650 MG RE SUPP: 650.0000 mg | Freq: Four times a day (QID) | RECTAL | Status: DC | PRN

## 2013-04-01 MED ORDER — ONDANSETRON HCL 4 MG/2ML IJ SOLN
4.0000 mg | Freq: Four times a day (QID) | INTRAMUSCULAR | Status: DC | PRN
  Administered 2013-04-04: 4 mg via INTRAVENOUS
  Filled 2013-04-01: qty 2

## 2013-04-01 MED ORDER — ARFORMOTEROL TARTRATE 15 MCG/2ML IN NEBU
7.5000 ug | INHALATION_SOLUTION | Freq: Two times a day (BID) | RESPIRATORY_TRACT | Status: DC
  Administered 2013-04-01: 7.5 ug via RESPIRATORY_TRACT
  Administered 2013-04-02: 15 ug via RESPIRATORY_TRACT
  Administered 2013-04-02 – 2013-04-03 (×2): 7.5 ug via RESPIRATORY_TRACT
  Administered 2013-04-03 – 2013-04-04 (×2): via RESPIRATORY_TRACT
  Administered 2013-04-05: 7.5 ug via RESPIRATORY_TRACT
  Administered 2013-04-05: 20:00:00 via RESPIRATORY_TRACT
  Administered 2013-04-06: 7.5 ug via RESPIRATORY_TRACT
  Filled 2013-04-01 (×13): qty 2

## 2013-04-01 MED ORDER — HEPARIN SODIUM (PORCINE) 5000 UNIT/ML IJ SOLN
5000.0000 [IU] | Freq: Three times a day (TID) | INTRAMUSCULAR | Status: DC
  Administered 2013-04-02: 5000 [IU] via SUBCUTANEOUS
  Filled 2013-04-01 (×8): qty 1

## 2013-04-01 MED ORDER — ALBUTEROL SULFATE (5 MG/ML) 0.5% IN NEBU: 2.5000 mg | INHALATION_SOLUTION | RESPIRATORY_TRACT | Status: DC | PRN

## 2013-04-01 MED ORDER — METRONIDAZOLE IN NACL 5-0.79 MG/ML-% IV SOLN
500.0000 mg | Freq: Three times a day (TID) | INTRAVENOUS | Status: DC
Start: 2013-04-01 — End: 2013-04-03
  Administered 2013-04-01 – 2013-04-03 (×7): 500 mg via INTRAVENOUS
  Filled 2013-04-01 (×8): qty 100

## 2013-04-01 MED ORDER — CIPROFLOXACIN IN D5W 400 MG/200ML IV SOLN
400.0000 mg | INTRAVENOUS | Status: DC
  Administered 2013-04-01 – 2013-04-03 (×3): 400 mg via INTRAVENOUS
  Filled 2013-04-01 (×4): qty 200

## 2013-04-01 MED ORDER — SODIUM CHLORIDE 0.9 % IV SOLN: INTRAVENOUS | Status: DC

## 2013-04-01 MED ORDER — SODIUM CHLORIDE 0.9 % IV BOLUS (SEPSIS)
250.0000 mL | Freq: Once | INTRAVENOUS | Status: AC
  Administered 2013-04-01: 250 mL via INTRAVENOUS

## 2013-04-01 NOTE — ED Notes (Signed)
Gave Dr. Wyvonnia Dusky with older copy. ECG was done by RN 12:16

## 2013-04-01 NOTE — ED Provider Notes (Signed)
CSN: [id number]     Arrival date & time 04/01/13  1140 History     First MD Initiated Contact with Patient 04/01/13 1149     Chief Complaint  Patient presents with  . Abdominal Pain   (Consider location/radiation/quality/duration/timing/severity/associated sxs/prior Treatment) HPI Comments: Patient presents with a five-day history of nausea, vomiting, diarrhea abdominal pain. This started after eating at [employer]. Is associated with lightheadedness, fatigue, dizziness. Patient has seen her PCP twice and received IV fluids in the office. She was referred to the ED today for her low blood pressure and bloody stools. She has CT scan yesterday that was unremarkable. She continues to have frequent loose stools that she cannot keep track of. She endorses bright red blood with wiping and bright red blood in the toilet bowl. States her nausea has improved with Phenergan. Denies any sick contacts. Denies any recent antibiotic use other than what she received this week. Denies any recent travel.  The history is provided by the patient.    Past Medical History  Diagnosis Date  . Asthma   . Reflux   . Hiatal hernia   . Peripheral vascular disease   . Hypertension   . Hyperlipidemia   . Iliac artery aneurysm   . Aortoiliac occlusive disease   . 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 Carotid artery occlusion   . Myocardial infarction 01/01/2000    Cardiac catheterization  . COPD (chronic obstructive pulmonary disease)   . Coronary artery disease   . Arnold-Chiari malformation 1998   Past Surgical History  Procedure Laterality Date  . Eye surgery    . Joint replacement      shoulder  . Coronary artery bypass graft  01/01/2000  . Post coronary artery  bpg  01/05/2000    Right jugular sheath removed  . Carotid endarterectomy  03/29/2010    Left  CEA  . Pr vein bypass graft,aorto-fem-pop    . Arnold-chiari malformation repair  1998   Family History  Problem Relation Age of  Onset  . Heart disease Mother     Heart Disease before age 85  . Hypertension Mother   . Hyperlipidemia Mother   . Heart attack Mother   . Clotting disorder Mother   . Heart disease Father     Heart Disease before age 70  . Heart attack Father   . Heart disease Brother     Heart Disease before age 89  . Hyperlipidemia Brother   . Hypertension Brother   . Clotting disorder Brother    History  Substance Use Topics  . Smoking status: Former Smoker -- 1.00 packs/day for 30 years    Types: Cigarettes    Quit date: 08/13/2000  . Smokeless tobacco: Never Used  . Alcohol Use: No   OB History   Grav Para Term Preterm Abortions TAB SAB Ect Mult Living                 Review of Systems  Constitutional: Positive for fever, activity change, appetite change and fatigue.  Respiratory: Negative for cough, chest tightness and shortness of breath.   Cardiovascular: Negative for chest pain.  Gastrointestinal: Positive for nausea, vomiting, abdominal pain and diarrhea.  Genitourinary: Negative for dysuria, hematuria, vaginal bleeding and vaginal discharge.  Musculoskeletal: Negative for back pain.  Skin: Negative for rash.  Neurological: Negative for dizziness, weakness and headaches.  A complete 10 system review of systems was obtained and all systems are negative except as noted  in the HPI and PMH.    Allergies  Codeine; Oxycodone-acetaminophen; and Avelox  Home Medications   No current outpatient prescriptions on file. BP 114/28  Pulse 58  Temp(Src) 97.7 F (36.5 C) (Oral)  Resp 20  Ht 5\' 2"  (1.575 m)  Wt 165 lb 12.6 oz (75.2 kg)  BMI 30.31 kg/m2  SpO2 100% Physical Exam  Constitutional: She is oriented to person, place, and time. She appears well-developed and well-nourished. No distress.  HENT:  Head: Normocephalic and atraumatic.  Mouth/Throat: Oropharynx is clear and moist. No oropharyngeal exudate.  Dry mucus membranes  Eyes: Conjunctivae and EOM are normal. Pupils  are equal, round, and reactive to light.  Neck: Normal range of motion. Neck supple.  Cardiovascular: Normal rate, regular rhythm and normal heart sounds.   No murmur heard. Pulmonary/Chest: Effort normal and breath sounds normal. No respiratory distress.  Abdominal: Soft. There is tenderness. There is no rebound and no guarding.  Mild diffuse tenderness without peritoneal signs.  Musculoskeletal: Normal range of motion. She exhibits no edema and no tenderness.  Neurological: She is alert and oriented to person, place, and time. No cranial nerve deficit. She exhibits normal muscle tone. Coordination normal.  Skin: Skin is warm.    ED Course   Procedures (including critical care time)  Labs Reviewed  CBC WITH DIFFERENTIAL - Abnormal; Notable for the following:    Hemoglobin 11.5 (*)    HCT 32.4 (*)    Monocytes Relative 20 (*)    Monocytes Absolute 1.5 (*)    All other components within normal limits  COMPREHENSIVE METABOLIC PANEL - Abnormal; Notable for the following:    Sodium 131 (*)    Potassium 3.2 (*)    Glucose, Bld 102 (*)    BUN 35 (*)    Creatinine, Ser 3.19 (*)    Albumin 3.0 (*)    GFR calc non Af Amer 14 (*)    GFR calc Af Amer 16 (*)    All other components within normal limits  OCCULT BLOOD, POC DEVICE - Abnormal; Notable for the following:    Fecal Occult Bld POSITIVE (*)    All other components within normal limits  CLOSTRIDIUM DIFFICILE BY PCR  STOOL CULTURE  MRSA PCR SCREENING  LIPASE, BLOOD  TROPONIN I  URINALYSIS, ROUTINE W REFLEX MICROSCOPIC  GI PATHOGEN PANEL BY PCR, STOOL  CG4 I-STAT (LACTIC ACID)   Dg Abd Acute W/chest  04/01/2013   *RADIOLOGY REPORT*  Clinical Data: Abdominal pain with nausea and vomiting.  ACUTE ABDOMEN SERIES (ABDOMEN 2 VIEW & CHEST 1 VIEW)  Comparison: None.  Findings: Prior CABG.  Normal heart size.  Mild scarring without infiltrates or failure.  No effusion or pneumothorax.  Flat decubitus abdomen reveals no free air or  obstruction.  Normal excretory pattern of the kidneys.  Bilateral vascular stents are seen in the iliac regions.  Prior cholecystectomy.  Negative osseous structures.  IMPRESSION: No active cardiopulmonary disease.  No free air or obstruction.   Original Report Authenticated By: Rolla Flatten, M.D.   1. Acute renal failure   2. Nausea & vomiting   3. Diarrhea   4. Hypokalemia   5. Hyponatremia     MDM  5 days of nausea, vomiting, diarrhea abdominal pain. Recent negative CT scan. Abdomen soft without peritoneal signs.  Small hemorhoids on exam with FOBT + stool. Hemoglobin stable.  Cr elevated at 3.  Baseline 1.2 .  Did receive contrast yesterday. Continue aggressive IV hydration. Symptom control.  Abdomen without peritoneal signs so will not repeat CT at this point.  CT scan August 19. No acute findings. No findings to explain abdominal pain or nausea vomiting or blood in the stool. Chronic changes from cholecystectomy, renal cysts, degenerative changes of the spine   Date: 04/01/2013  Rate: 63  Rhythm: normal sinus rhythm  QRS Axis: normal  Intervals: normal  ST/T Wave abnormalities: nonspecific ST/T changes  Conduction Disutrbances:none  Narrative Interpretation:   Old EKG Reviewed: unchanged    Ezequiel Essex, MD 04/01/13 [phone]

## 2013-04-01 NOTE — H&P (Signed)
[facility] History and Physical  [name] [id number] DOB: [birth year] DOA: 04/01/2013  Referring physician: er PCP: [name], MD  Specialists: none  Chief Complaint: N/V/D  HPI: [name] is a 72 y.o. female  Who at a egg sandwich at [employer] 5 days ago.  An hour after eating, she developed N/V/Adominal pain/dirrhea.  She has been to her PCP x 2 trips since then- with IVF given, labs drawn and CT scan done- CT scan with IV contrast showed no acute findings on 8/19 (results in paper chart).  Cr on 8/18 was 1.2.  No WBC count.  Was given cipro/flagyl (few days ago).  Not better.  No sick contacts.  Pain in abd is diffuse and nothing makes better or worse.  No recent travel.   +blood in her stool (grossly red)- +extrenal and internal hemorrhoids  In the, ER her Cr was elevated at 3.  She is still having N/D/V and her BP was < 90 Systolically- ER recommends to place patient in SDU overnight  Review of Systems: all systems reviewed, negative unless stated above   Past Medical History  Diagnosis Date  . Asthma   . Reflux   . Hiatal hernia   . Peripheral vascular disease   . Hypertension   . Hyperlipidemia   . Iliac artery aneurysm   . Aortoiliac occlusive disease   . 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 Carotid artery occlusion   . Myocardial infarction 01/01/2000    Cardiac catheterization  . COPD (chronic obstructive pulmonary disease)   . Coronary artery disease   . Arnold-Chiari malformation 1998   Past Surgical History  Procedure Laterality Date  . Eye surgery    . Joint replacement      shoulder  . Coronary artery bypass graft  01/01/2000  . Post coronary artery  bpg  01/05/2000    Right jugular sheath removed  . Carotid endarterectomy  03/29/2010    Left  CEA  . Pr vein bypass graft,aorto-fem-pop    . Arnold-chiari malformation repair  1998   Social History:  reports that she quit smoking about 12 years ago. Her smoking use  included Cigarettes. She has a 30 pack-year smoking history. She has never used smokeless tobacco. She reports that she does not drink alcohol or use illicit drugs.   Allergies  Allergen Reactions  . Codeine Other (See Comments)    Dr. Terrence Dupont advised patient not to take this medication  . Oxycodone-Acetaminophen   . Avelox [Moxifloxacin Hcl In Nacl] Palpitations    Family History  Problem Relation Age of Onset  . Heart disease Mother     Heart Disease before age 65  . Hypertension Mother   . Hyperlipidemia Mother   . Heart attack Mother   . Clotting disorder Mother   . Heart disease Father     Heart Disease before age 75  . Heart attack Father   . Heart disease Brother     Heart Disease before age 24  . Hyperlipidemia Brother   . Hypertension Brother   . Clotting disorder Brother     Prior to Admission medications   Medication Sig Start Date End Date Taking? Authorizing Provider  albuterol (PROVENTIL) (2.5 MG/3ML) 0.083% nebulizer solution Take 2.5 mg by nebulization every 4 (four) hours as needed for wheezing or shortness of breath (((PLAN C))).     Historical Provider, MD  albuterol (VENTOLIN HFA) 108 (90 BASE) MCG/ACT inhaler Inhale 2 puffs  into the lungs every 4 (four) hours as needed for wheezing or shortness of breath (((PLAN B))).     Historical Provider, MD  amLODipine-olmesartan (AZOR) 10-40 MG per tablet Take 1 tablet by mouth daily.    Historical Provider, MD  budesonide (PULMICORT) 0.25 MG/2ML nebulizer solution One twice daily with perforomist 11/17/12   Tanda Rockers, MD  cetirizine (ZYRTEC) 1 MG/ML syrup Take 1 mg by mouth daily.    Historical Provider, MD  clopidogrel (PLAVIX) 75 MG tablet Take 1 tablet (75 mg total) by mouth daily. 04/02/12   Conrad Crystal Rock, MD  dexlansoprazole (DEXILANT) 60 MG capsule Take 1 capsule (60 mg total) by mouth daily before breakfast. 11/17/12   Tanda Rockers, MD  dextromethorphan-guaiFENesin [facility] DM) 30-600 MG per 12 hr tablet Take  1 tablet by mouth continuous as needed.     Historical Provider, MD  formoterol (PERFOROMIST) 20 MCG/2ML nebulizer solution Take 20 mcg by nebulization daily. Half dose per day.    Historical Provider, MD  hydrochlorothiazide (MICROZIDE) 12.5 MG capsule Take 1 tablet by mouth Daily. 06/12/12   Historical Provider, MD  nitroGLYCERIN (NITROSTAT) 0.4 MG SL tablet Place 0.4 mg under the tongue continuous as needed for chest pain.     Historical Provider, MD  rosuvastatin (CRESTOR) 10 MG tablet Take 10 mg by mouth at bedtime.     Historical Provider, MD   Physical Exam: Filed Vitals:   04/01/13 1315  BP: 90/44  Pulse: 61  Temp:   Resp: 16    Constitutional: She is oriented to person, place, and time. She appears well-developed and well-nourished. No distress.   Head: Normocephalic and atraumatic.  Mouth/Throat: Oropharynx is clear and moist. No oropharyngeal exudate.  Dry mucus membranes  Eyes: Conjunctivae and EOM are normal. Pupils are equal, round, and reactive to light.  Neck: Normal range of motion. Neck supple.  Cardiovascular: Normal rate, regular rhythm and normal heart sounds.  No murmur heard.  Pulmonary/Chest: Effort normal and breath sounds normal. No respiratory distress.  Abdominal: Soft. There is tenderness. There is no rebound and no guarding.  No peritoneal signs.  Musculoskeletal: Normal range of motion. She exhibits no edema and no tenderness.  Neurological: She is alert and oriented to person, place, and time. No cranial nerve deficit. She exhibits normal muscle tone. Coordination normal.  Skin: Skin is warm.   Labs on Admission:  Basic Metabolic Panel:  Recent Labs Lab 04/01/13 1150  NA 131*  K 3.2*  CL 96  CO2 20  GLUCOSE 102*  BUN 35*  CREATININE 3.19*  CALCIUM 8.9   Liver Function Tests:  Recent Labs Lab 04/01/13 1150  AST 20  ALT 10  ALKPHOS 61  BILITOT 0.3  PROT 6.6  ALBUMIN 3.0*    Recent Labs Lab 04/01/13 1150  LIPASE 32   No  results found for this basename: AMMONIA,  in the last 168 hours CBC:  Recent Labs Lab 04/01/13 1150  WBC 7.6  NEUTROABS 4.6  HGB 11.5*  HCT 32.4*  MCV 81.6  PLT 196   Cardiac Enzymes:  Recent Labs Lab 04/01/13 1206  TROPONINI <0.30    BNP (last 3 results) No results found for this basename: PROBNP,  in the last 8760 hours CBG: No results found for this basename: GLUCAP,  in the last 168 hours  Radiological Exams on Admission: Dg Abd Acute W/chest  04/01/2013   *RADIOLOGY REPORT*  Clinical Data: Abdominal pain with nausea and vomiting.  ACUTE ABDOMEN  SERIES (ABDOMEN 2 VIEW & CHEST 1 VIEW)  Comparison: None.  Findings: Prior CABG.  Normal heart size.  Mild scarring without infiltrates or failure.  No effusion or pneumothorax.  Flat decubitus abdomen reveals no free air or obstruction.  Normal excretory pattern of the kidneys.  Bilateral vascular stents are seen in the iliac regions.  Prior cholecystectomy.  Negative osseous structures.  IMPRESSION: No active cardiopulmonary disease.  No free air or obstruction.   Original Report Authenticated By: Rolla Flatten, M.D.    EKG: Independently reviewed. NSR  Assessment/Plan Principal Problem:   AKI (acute kidney injury) Active Problems:   Hypotension   Nausea & vomiting   Diarrhea   Hypokalemia   Hyponatremia   1. AKI- IVF with K, hold nephrotoxic medications- did get IV contract so will try to flush kidneys, monitor output- if not better by AM with IVF- get renal U;/S +/- nephro consult 2. N/V/D- stool studies- monitor output 3. Hyponatremia- IVF and recheck 4. Hypokalemia- replete and recheck 5. Hypotension- hold BP medications, IVF- will place in SDU until BP improves, may can be transferred out later tonight or in the AM.     Code Status: full Family Communication: patient and family Disposition Plan: SDU per ER recs  Time spent: 31 min  Tauna Macfarlane [facility] Pager [phone]  If 7PM-7AM, please  contact night-coverage [email or web address] Password [email or web address] 04/01/2013, 2:15 PM

## 2013-04-01 NOTE — ED Notes (Signed)
MD at bedside. 

## 2013-04-01 NOTE — ED Notes (Signed)
States has been sick for a while w/ nausea and diarrhea and now she all blood in stool was seen yesterday and given fluids her bp was low had ct done

## 2013-04-01 NOTE — ED Notes (Signed)
Patient transported to X-ray 

## 2013-04-01 NOTE — Care Management Note (Signed)
    Page 1 of 1   04/01/2013     3:01:14 PM   CARE MANAGEMENT NOTE 04/01/2013  Patient:  [name], [name]   Account Number:  [id number]  Date Initiated:  04/01/2013  Documentation initiated by:  Elissa Hefty  Subjective/Objective Assessment:   adm w hypotension     Action/Plan:   lives ? alone, pcp dr [name]   Anticipated DC Date:     Anticipated DC Plan:        DC Planning Services  CM consult      Choice offered to / List presented to:             Status of service:   Medicare Important Message given?   (If response is "NO", the following Medicare IM given date fields will be blank) Date Medicare IM given:   Date Additional Medicare IM given:    Discharge Disposition:    Per UR Regulation:  Reviewed for med. necessity/level of care/duration of stay  If discussed at [place] of Stay Meetings, dates discussed:    Comments:

## 2013-04-01 NOTE — ED Notes (Signed)
Pt aware of the need for a urine sample and stool sample.

## 2013-04-01 NOTE — ED Notes (Signed)
Med records from PCP faxed over and given to MD at this time.

## 2013-04-01 NOTE — Progress Notes (Signed)
ANTIBIOTIC CONSULT NOTE - INITIAL  Pharmacy Consult for cipro Indication:  Empiric coverage  Allergies  Allergen Reactions  . Codeine Other (See Comments)    Dr. Terrence Dupont advised patient not to take this medication  . Oxycodone-Acetaminophen Other (See Comments)    Says it makes her feel weird  . Avelox [Moxifloxacin Hcl In Nacl] Palpitations    Patient Measurements: Height: 5\' 2"  (157.5 cm) Weight: 165 lb 12.6 oz (75.2 kg) IBW/kg (Calculated) : 50.1   Vital Signs: Temp: 97.7 F (36.5 C) (08/20 1437) Temp src: Oral (08/20 1437) BP: 102/33 mmHg (08/20 1437) Pulse Rate: 53 (08/20 1437) Intake/Output from previous day:   Intake/Output from this shift: Total I/O In: 100 [I.V.:100] Out: -   Labs:  Recent Labs  04/01/13 1150  WBC 7.6  HGB 11.5*  PLT 196  CREATININE 3.19*   Estimated Creatinine Clearance: 15.6 ml/min (by C-G formula based on Cr of 3.19). No results found for this basename: VANCOTROUGH, VANCOPEAK, VANCORANDOM, GENTTROUGH, GENTPEAK, GENTRANDOM, TOBRATROUGH, TOBRAPEAK, TOBRARND, AMIKACINPEAK, AMIKACINTROU, AMIKACIN,  in the last 72 hours   Microbiology: No results found for this or any previous visit (from the past 720 hour(s)).  Medical History: Past Medical History  Diagnosis Date  . Asthma   . Reflux   . Hiatal hernia   . Peripheral vascular disease   . Hypertension   . Hyperlipidemia   . Iliac artery aneurysm   . Aortoiliac occlusive disease   . 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 Carotid artery occlusion   . Myocardial infarction 01/01/2000    Cardiac catheterization  . COPD (chronic obstructive pulmonary disease)   . Coronary artery disease   . Arnold-Chiari malformation 1998    Medications:  Prescriptions prior to admission  Medication Sig Dispense Refill  . albuterol (PROVENTIL) (2.5 MG/3ML) 0.083% nebulizer solution Take 2.5 mg by nebulization every 4 (four) hours as needed for wheezing or shortness of breath (((PLAN  C))).       . albuterol (VENTOLIN HFA) 108 (90 BASE) MCG/ACT inhaler Inhale 2 puffs into the lungs every 4 (four) hours as needed for wheezing or shortness of breath (((PLAN B))).       Marland Kitchen amLODipine-olmesartan (AZOR) 10-40 MG per tablet Take 1 tablet by mouth daily.      . budesonide (PULMICORT) 0.25 MG/2ML nebulizer solution One twice daily with perforomist  120 mL  12  . cetirizine (ZYRTEC) 1 MG/ML syrup Take 5 mg by mouth as needed (allergies).      . clopidogrel (PLAVIX) 75 MG tablet Take 1 tablet (75 mg total) by mouth daily.  90 tablet  3  . dexlansoprazole (DEXILANT) 60 MG capsule Take 1 capsule (60 mg total) by mouth daily before breakfast.  30 capsule  11  . formoterol (PERFOROMIST) 20 MCG/2ML nebulizer solution Take 20 mcg by nebulization daily. Half dose per day.      . nitroGLYCERIN (NITROSTAT) 0.4 MG SL tablet Place 0.4 mg under the tongue continuous as needed for chest pain.       . rosuvastatin (CRESTOR) 10 MG tablet Take 10 mg by mouth at bedtime.        Scheduled:  . arformoterol  15 mcg Nebulization Q12H  . budesonide  0.25 mg Nebulization BID  . clopidogrel  75 mg Oral Daily  . heparin  5,000 Units Subcutaneous Q8H  . metronidazole  500 mg Intravenous Q8H  . pantoprazole  40 mg Oral Daily  . sodium chloride  3 mL Intravenous  Q12H     Assessment: 72 yo female here with N/V/D and noted with abdominal pain to begin cipro per pharmacy (patient noted on flagyl). WBC= 7.6, afebrile, SCr= 3.19, CrCl ~ 15. Patient noted with AKI (last SCr= 0.77 07/2012).    Plan:  -Cipro 400mg  IV q24hr -Will follow renal function and clinical progress  Hildred Laser, Pharm D 04/01/2013 3:26 PM

## 2013-04-02 DIAGNOSIS — I2510 Atherosclerotic heart disease of native coronary artery without angina pectoris: Secondary | ICD-10-CM | POA: Diagnosis present

## 2013-04-02 DIAGNOSIS — E860 Dehydration: Secondary | ICD-10-CM | POA: Diagnosis present

## 2013-04-02 DIAGNOSIS — E876 Hypokalemia: Secondary | ICD-10-CM

## 2013-04-02 DIAGNOSIS — I959 Hypotension, unspecified: Secondary | ICD-10-CM

## 2013-04-02 LAB — COMPREHENSIVE METABOLIC PANEL
ALT: 8 U/L (ref 0–35)
AST: 16 U/L (ref 0–37)
Albumin: 2.4 g/dL — ABNORMAL LOW (ref 3.5–5.2)
CO2: 18 mEq/L — ABNORMAL LOW (ref 19–32)
Calcium: 7.7 mg/dL — ABNORMAL LOW (ref 8.4–10.5)
Chloride: 103 mEq/L (ref 96–112)
GFR calc non Af Amer: 17 mL/min — ABNORMAL LOW (ref >90)
Sodium: 133 mEq/L — ABNORMAL LOW (ref 135–145)

## 2013-04-02 LAB — CBC
Hemoglobin: 9 g/dL — ABNORMAL LOW (ref 12.0–15.0)
MCH: 28.1 pg (ref 26.0–34.0)
MCHC: 34.1 g/dL (ref 30.0–36.0)
MCV: 82.5 fL (ref 78.0–100.0)

## 2013-04-02 LAB — URINALYSIS, ROUTINE W REFLEX MICROSCOPIC
Glucose, UA: NEGATIVE mg/dL
Hgb urine dipstick: NEGATIVE
Specific Gravity, Urine: 1.017 (ref 1.005–1.030)

## 2013-04-02 MED ORDER — DIPHENOXYLATE-ATROPINE 2.5-0.025 MG PO TABS
1.0000 | ORAL_TABLET | Freq: Three times a day (TID) | ORAL | Status: DC
  Administered 2013-04-02 – 2013-04-03 (×4): 1 via ORAL
  Filled 2013-04-02 (×4): qty 1

## 2013-04-02 MED ORDER — SODIUM CHLORIDE 0.9 % IV BOLUS (SEPSIS): 250.0000 mL | Freq: Once | INTRAVENOUS | Status: DC

## 2013-04-02 MED ORDER — SODIUM CHLORIDE 0.9 % IV BOLUS (SEPSIS)
500.0000 mL | Freq: Once | INTRAVENOUS | Status: AC
  Administered 2013-04-02: 500 mL via INTRAVENOUS

## 2013-04-02 MED ORDER — POTASSIUM CHLORIDE CRYS ER 20 MEQ PO TBCR
40.0000 meq | EXTENDED_RELEASE_TABLET | Freq: Two times a day (BID) | ORAL | Status: AC
  Administered 2013-04-02 (×2): 40 meq via ORAL
  Filled 2013-04-02 (×2): qty 2

## 2013-04-02 MED ORDER — DIPHENOXYLATE-ATROPINE 2.5-0.025 MG PO TABS
2.0000 | ORAL_TABLET | ORAL | Status: AC
  Administered 2013-04-02: 2 via ORAL
  Filled 2013-04-02: qty 2

## 2013-04-02 NOTE — Progress Notes (Signed)
Event: Multiple calls from RN regarding pt's low BP. MAPs have ranged 40's to 60's despite a couple a IVF boluses. Pt is reported to be completely asymptomatic though output has been minimal, (100cc over last 6 hrs). NP to bedside. Subjective:  Pt admits she is feeling much better and reports her diarrhea has diminished since admission. Last episode was in ED. She relates (when ask) about her cardiac cath in 2001. She reports she had stents placed but they "failed" approx 6 months later after which she underwent "open heart surgery". Denies frequent CP or dypnsea w/ exertion. Objective: [name] is a 72 y/o elderly female who presented to ED on 04/01/13  with a five-day history of n/V/D and abd pain. This started after eating at [employer]. Sx's associated with lightheadedness, fatigue and dizziness. Pt was seen her PCP twice and received IV fluids in the office. She was referred to the ED today for her low blood pressure and bloody stools. She had CT scan yesterday that was unremarkable. She continues to have frequent loose stools that she cannot keep track of. She endorses bright red blood with wiping and bright red blood in the toilet bowl. She reorted her nausea improved with Phenergan. She denied any sick contacts,  recent antibiotic use or recent travel. At bedside pt is very alert, smiling and appropriate. Skin w/d color normal, PPP strong and equal, extremities warm. Cap refill in all extremities < 2 sec. BBS CTA, effort normal. Current VS, T-98.2, BP-100/50 (manual) MAP-67 and 90/34 (automatic) MAP-53, P-55. R-18 w/ 02 sats of 100% on R/A. Cr on admission 3.19, Cr on 8/18 1.2. K= in ED 3.2 (repleted). Na 131.  Assessment/Plan: 1. Persistent hypotension: Likely volume depletion/dehydration related to pt's recent N/V/D. Though somewhat diminished UOP pt does not appear hypoperfused. There is a significant difference in the manual cuff pressure and the automatic pressure so will need to evaluate  manual pressures periodically. Will give an additional 500 cc IVF bolus and repeat manual BP.  2. AKI: Per admission plan continue IVF's (NS w/ 31meq KCL at 100 cc/hr) , monitor outpt (current hourly UOP just under 20 cc/hr.  Plan is for renal u/s and nephrology consult in am if not improved.  3. Hypokalemia/Hyponatremia: Continue NS w/ K+ and f/u AM labs. Will continue to monitor closely in SDU.  Jeryl Columbia, NP-C [facility] Pager [phone]

## 2013-04-02 NOTE — Progress Notes (Addendum)
[facility] Progress Note [place] TEAM 1 - Stepdown ICU Team   [name] [id number] DOB: [birth year] DOA: 04/01/2013 PCP: [name], MD  Brief narrative: 72 year old female patient who ate an egg sandwich at [employer] 5 days prior to presentation. Within one hour after eating the sandwich she developed significant nausea vomiting abdominal pain diarrhea. These symptoms persisted. She had seen her family doctor for 2 visits. Was given IV fluid, labs were obtained and a CT abdomen with IV contrast was completed. This CT was done in an outside facility and showed no explanation for patient's diarrhea. Several days ago she was started on Cipro and Flagyl to treat any potential infectious etiology. She has had no sick contacts. She was very dizzy. Her stool had grossly red visible blood. Upon presentation to the ER her creatinine was elevated at 3.0. On 8/18 this had increased to 1.2. Her baseline creatinine is 0.77. She was also hypotensive with systolic readings less than 90 in the emergency department and had been given multiple fluid challenges.  Assessment/Plan: Active Problems:   AKI (acute kidney injury) -Baseline 0.77 -Subsequent increase after IV contrast for CT -Suspect combination of prerenal from dehydration diarrhea as well as contrast-induced nephropathy -Continue to follow trend which is currently downward; no indication at this time to pursue nephrology consultation    Hypotension -Secondary to vitamin depletion and improving with hydration -Continue IV fluids    Diarrhea/Dehydration -Loose stools have recurred with introduction of solid diet therefore we'll back off to clear liquids -C. difficile negative -Stool culture pending although likely will be an accurate for any potential causes such as Salmonella since his artery received antibiotics prior to admission -Can use Lomotil to minimize diarrhea frequency     Hypokalemia/Hyponatremia -Due to GI losses from profound diarrhea and associated hypotension-follow electrolytes and replete when necessary    COPD GOLD II -Followed by Dr. Melvyn Novas as an outpatient -Compensated    Peripheral vascular disease, unspecified -Has history of prior carotid endarterectomy in 2011 -Last saw Dr. Doren Custard in the office May 2014 with noted carotid endarterectomy site widely patent    CAD (coronary artery disease)  -Followed as an outpatient by Dr. Terrence Dupont and currently asymptomatic -Had normal Myoview scan in June of 2014 with EF documented as 74%    DVT prophylaxis: Patient initially refused heparin because she was on Plavix but did not understand the difference in the time anticoagulation. Now agreeable to heparin for DVT prophylaxis. Because blood has cleared from stool agree it is safe to utilize this medication but may need to stop if bleeding recurs Code Status: Full Family Communication: Patient and daughter at Disposition Plan/Expected LOS: Stepdown Isolation: None Nutritional Status: Acute protein calorie malnutrition related to acute gastrointestinal illness  Consultants: None  Procedures: None  Antibiotics: Flagyl 8/20 >>> Cipro 8/20 >>>  HPI/Subjective: Patient alert and denies current abdominal pain. States after eating developed some mild loose stools but no further blood or emesis.  Objective: Blood pressure 90/34, pulse 61, temperature 97.9 F (36.6 C), temperature source Oral, resp. rate 19, height 5\' 2"  (1.575 m), weight 75.2 kg (165 lb 12.6 oz), SpO2 96.00%.  Intake/Output Summary (Last 24 hours) at 04/02/13 1358 Last data filed at 04/02/13 1300  Gross per 24 hour  Intake   3100 ml  Output    700 ml  Net   2400 ml     Exam: General: No acute respiratory distress Lungs: Clear to auscultation bilaterally without wheezes or crackles,  RA Cardiovascular: Regular rate and rhythm without murmur gallop or rub normal S1 and S2, no  peripheral edema or JVD-systolic blood pressure has been as low as 86 today-IV fluids at 125 cc per hour Abdomen: Nontender, nondistended, soft, bowel sounds positive, no rebound, no ascites, no appreciable mass-semi-formed green bilious stool in bedside come Musculoskeletal: No significant cyanosis, clubbing of bilateral lower extremities Neurological: Alert and oriented x 3, moves all extremities x 4 without focal neurological deficits, CN 2-12 intact  Scheduled Meds:  Scheduled Meds: . arformoterol  7.5 mcg Nebulization Q12H  . budesonide  0.25 mg Nebulization BID  . ciprofloxacin  400 mg Intravenous Q24H  . clopidogrel  75 mg Oral Daily  . diphenoxylate-atropine  1 tablet Oral TID AC  . heparin  5,000 Units Subcutaneous Q8H  . metronidazole  500 mg Intravenous Q8H  . pantoprazole  40 mg Oral Daily  . potassium chloride  40 mEq Oral BID  . sodium chloride  3 mL Intravenous Q12H   Continuous Infusions: . 0.9 % NaCl with KCl 20 mEq / L 125 mL/hr at 04/02/13 [id number]    **Reviewed in detail by the Attending Physicia  Data Reviewed: Basic Metabolic Panel:  Recent Labs Lab 04/01/13 1150 04/02/13 0430  NA 131* 133*  K 3.2* 3.1*  CL 96 103  CO2 20 18*  GLUCOSE 102* 96  BUN 35* 36*  CREATININE 3.19* 2.72*  CALCIUM 8.9 7.7*   Liver Function Tests:  Recent Labs Lab 04/01/13 1150 04/02/13 0430  AST 20 16  ALT 10 8  ALKPHOS 61 50  BILITOT 0.3 0.2*  PROT 6.6 5.4*  ALBUMIN 3.0* 2.4*    Recent Labs Lab 04/01/13 1150  LIPASE 32   No results found for this basename: AMMONIA,  in the last 168 hours CBC:  Recent Labs Lab 04/01/13 1150 04/02/13 0430  WBC 7.6 6.5  NEUTROABS 4.6  --   HGB 11.5* 9.0*  HCT 32.4* 26.4*  MCV 81.6 82.5  PLT 196 185   Cardiac Enzymes:  Recent Labs Lab 04/01/13 1206  TROPONINI <0.30   BNP (last 3 results) No results found for this basename: PROBNP,  in the last 8760 hours CBG: No results found for this basename: GLUCAP,  in the  last 168 hours  Recent Results (from the past 240 hour(s))  CLOSTRIDIUM DIFFICILE BY PCR     Status: None   Collection Time    04/01/13  1:53 PM      Result Value Range Status   C difficile by pcr NEGATIVE  NEGATIVE Final  MRSA PCR SCREENING     Status: None   Collection Time    04/01/13  2:33 PM      Result Value Range Status   MRSA by PCR NEGATIVE  NEGATIVE Final   Comment:            The GeneXpert MRSA Assay (FDA     approved for NASAL specimens     only), is one component of a     comprehensive MRSA colonization     surveillance program. It is not     intended to diagnose MRSA     infection nor to guide or     monitor treatment for     MRSA infections.     Studies:  Recent x-ray studies have been reviewed in detail by the Attending Physician    Erin Hearing, ANP [facility] Office  [phone] Pager [phone]  **If unable to reach the above  provider after paging please contact the Flow Manager @ [phone]  On-Call/Text Page:      [email or web address]      password [email or web address]  If 7PM-7AM, please contact night-coverage [email or web address] Password [facility] 04/02/2013, 1:58 PM   LOS: 1 day   I have examined the patient, reviewed the chart and modified the above note which I agree with.   RIZWAN,SAIMA,MD [id number] 04/02/2013, 4:29 PM

## 2013-04-02 NOTE — Progress Notes (Signed)
Nutrition Brief Note  Patient identified on the Malnutrition Screening Tool (MST) Report  Wt Readings from Last 15 Encounters:  04/01/13 165 lb 12.6 oz (75.2 kg)  01/07/13 169 lb (76.658 kg)  12/24/12 171 lb 12.8 oz (77.928 kg)  12/02/12 173 lb 12.8 oz (78.835 kg)  11/17/12 173 lb 3.2 oz (78.563 kg)  12/17/11 173 lb 8 oz (78.699 kg)    Body mass index is 30.31 kg/(m^2). Patient meets criteria for obese, class 1 based on current BMI.   Current diet order is clear liquid, patient is consuming approximately 100% of meals at this time. Labs and medications reviewed.   Pt reports poor appetite since acute illness 5 days ago.  She reports she typically has a small appetite and weighs 165-170 lbs.  Pt denies nutrition-related concerns. No nutrition interventions warranted at this time. If nutrition issues arise, please consult RD.  Will monitor for diet advancement.   Brynda Greathouse, MS RD LDN Clinical Inpatient Dietitian Pager: [phone] Weekend/After hours pager: [phone]

## 2013-04-03 LAB — BASIC METABOLIC PANEL
BUN: 14 mg/dL (ref 6–23)
Chloride: 114 mEq/L — ABNORMAL HIGH (ref 96–112)
GFR calc non Af Amer: 41 mL/min — ABNORMAL LOW (ref >90)
Glucose, Bld: 92 mg/dL (ref 70–99)
Potassium: 4.5 mEq/L (ref 3.5–5.1)
Sodium: 140 mEq/L (ref 135–145)

## 2013-04-03 LAB — COMPREHENSIVE METABOLIC PANEL
ALT: 8 U/L (ref 0–35)
AST: 14 U/L (ref 0–37)
Albumin: 2.2 g/dL — ABNORMAL LOW (ref 3.5–5.2)
Alkaline Phosphatase: 48 U/L (ref 39–117)
Calcium: 8 mg/dL — ABNORMAL LOW (ref 8.4–10.5)
Potassium: 3.9 mEq/L (ref 3.5–5.1)
Sodium: 138 mEq/L (ref 135–145)
Total Protein: 5.2 g/dL — ABNORMAL LOW (ref 6.0–8.3)

## 2013-04-03 LAB — CBC
Hemoglobin: 8.5 g/dL — ABNORMAL LOW (ref 12.0–15.0)
MCH: 28.3 pg (ref 26.0–34.0)
MCH: 28.3 pg (ref 26.0–34.0)
MCHC: 34 g/dL (ref 30.0–36.0)
MCHC: 34 g/dL (ref 30.0–36.0)
MCV: 83.2 fL (ref 78.0–100.0)
Platelets: 173 10*3/uL (ref 150–400)
Platelets: 177 10*3/uL (ref 150–400)
RDW: 15 % (ref 11.5–15.5)
RDW: 15.2 % (ref 11.5–15.5)
WBC: 6.1 10*3/uL (ref 4.0–10.5)

## 2013-04-03 LAB — GI PATHOGEN PANEL BY PCR, STOOL
C difficile toxin A/B: NEGATIVE
Campylobacter by PCR: NEGATIVE
Cryptosporidium by PCR: NEGATIVE
E coli 0157 by PCR: NEGATIVE
G lamblia by PCR: NEGATIVE

## 2013-04-03 NOTE — Progress Notes (Signed)
[facility] Progress Note [place] TEAM 1 - Stepdown ICU Team   [name] [id number] DOB: 06-Jul-1941 DOA: 04/01/2013 PCP: [name], MD  Brief narrative: 72 year old female patient who ate an egg sandwich at [employer] 5 days prior to presentation. Within one hour after eating the sandwich she developed significant nausea vomiting abdominal pain diarrhea. These symptoms persisted. She had seen her family doctor for 2 visits. Was given IV fluid, labs were obtained and a CT abdomen with IV contrast was completed. This CT was done in an outside facility and showed no explanation for patient's diarrhea. Several days ago she was started on Cipro and Flagyl to treat any potential infectious etiology. She has had no sick contacts. She was very dizzy. Her stool had grossly red visible blood. Upon presentation to the ER her creatinine was elevated at 3.0. Her baseline creatinine is 0.77. She was also hypotensive with systolic readings less than 90 in the emergency department.  Assessment/Plan:    AKI (acute kidney injury) -Baseline 0.77 - peak 3.19 this admit - today down to 1.29 -Suspect combination of prerenal from dehydration diarrhea as well as contrast-induced nephropathy -Continue to follow trend     Hypotension -Secondary to volume depletion and improving with hydration -Continue IV fluids    Diarrhea/Dehydration -re-developed blood in stools overnight/likely due to attempt to use Heparin for DVT prophylaxis - regardless pt still requires Plavix for CAD so will ask GI to see -sees [place] as OP -C. difficile negative -Stool culture pending     Hypokalemia/Hyponatremia -Due to GI losses from profound diarrhea and associated hypotension-follow electrolytes and replete when necessary    COPD GOLD II -Followed by Dr. Melvyn Novas as an outpatient -Compensated    Peripheral vascular disease, unspecified -Has history of prior carotid endarterectomy in 2011 -Last  saw Dr. Doren Custard in the office May 2014 with noted carotid endarterectomy site widely patent    CAD (coronary artery disease)  -Followed as an outpatient by Dr. Terrence Dupont and currently asymptomatic -Had normal Myoview scan in June of 2014 with EF documented as 74%   DVT prophylaxis: SCDs Code Status: Full Family Communication: no family present at time of exam  Disposition Plan/Expected LOS: Stepdown  Consultants: Gastroenterology  Procedures: None  Antibiotics: Flagyl 8/20 >>>8/22 Cipro 8/20 >>>  HPI/Subjective: Patient alert and reports nurse told her has had several bloody stools since we last saw her yesterday  Objective: Blood pressure 125/30, pulse 61, temperature 98.4 F (36.9 C), temperature source Oral, resp. rate 14, height 5\' 2"  (1.575 m), weight 75.2 kg (165 lb 12.6 oz), SpO2 96.00%.  Intake/Output Summary (Last 24 hours) at 04/03/13 1150 Last data filed at 04/03/13 1000  Gross per 24 hour  Intake   4945 ml  Output   2200 ml  Net   2745 ml    Exam: General: No acute respiratory distress Lungs: Clear to auscultation bilaterally without wheezes or crackles, RA Cardiovascular: Regular rate and rhythm without murmur gallop or rub normal S1 and S2, no peripheral edema or JVD Abdomen: Nontender, nondistended, soft, bowel sounds positive, no rebound, no ascites, no appreciable mass-reported blood in stool overnight Musculoskeletal: No significant cyanosis, clubbing of bilateral lower extremities Neurological: Alert and oriented x 3, moves all extremities x 4 without focal neurological deficits, CN 2-12 intact  Scheduled Meds:  Scheduled Meds: . arformoterol  7.5 mcg Nebulization Q12H  . budesonide  0.25 mg Nebulization BID  . ciprofloxacin  400 mg Intravenous Q24H  . clopidogrel  75 mg Oral Daily  . diphenoxylate-atropine  1 tablet Oral TID AC  . heparin  5,000 Units Subcutaneous Q8H  . metronidazole  500 mg Intravenous Q8H  . pantoprazole  40 mg Oral Daily  .  sodium chloride  3 mL Intravenous Q12H   Data Reviewed: Basic Metabolic Panel:  Recent Labs Lab 04/01/13 1150 04/02/13 0010 04/02/13 0430 04/03/13 0758  NA 131* 138 133* 140  K 3.2* 3.9 3.1* 4.5  CL 96 111 103 114*  CO2 20 18* 18* 19  GLUCOSE 102* 88 96 92  BUN 35* 20 36* 14  CREATININE 3.19* 1.47* 2.72* 1.29*  CALCIUM 8.9 8.0* 7.7* 8.4   Liver Function Tests:  Recent Labs Lab 04/01/13 1150 04/02/13 0010 04/02/13 0430  AST 20 14 16   ALT 10 8 8   ALKPHOS 61 48 50  BILITOT 0.3 0.1* 0.2*  PROT 6.6 5.2* 5.4*  ALBUMIN 3.0* 2.2* 2.4*    Recent Labs Lab 04/01/13 1150  LIPASE 32   CBC:  Recent Labs Lab 04/01/13 1150 04/02/13 0010 04/02/13 0430 04/03/13 0530  WBC 7.6 6.1 6.5 6.2  NEUTROABS 4.6  --   --   --   HGB 11.5* 8.6* 9.0* 8.5*  HCT 32.4* 25.3* 26.4* 25.0*  MCV 81.6 83.2 82.5 83.3  PLT 196 173 185 177   Cardiac Enzymes:  Recent Labs Lab 04/01/13 1206  TROPONINI <0.30     Recent Results (from the past 240 hour(s))  CLOSTRIDIUM DIFFICILE BY PCR     Status: None   Collection Time    04/01/13  1:53 PM      Result Value Range Status   C difficile by pcr NEGATIVE  NEGATIVE Final  STOOL CULTURE     Status: None   Collection Time    04/01/13  1:53 PM      Result Value Range Status   Specimen Description STOOL   Final   Special Requests NONE   Final   Culture     Final   Value: Culture reincubated for better growth     Performed at [facility]   Report Status PENDING   Incomplete  MRSA PCR SCREENING     Status: None   Collection Time    04/01/13  2:33 PM      Result Value Range Status   MRSA by PCR NEGATIVE  NEGATIVE Final   Comment:            The GeneXpert MRSA Assay (FDA     approved for NASAL specimens     only), is one component of a     comprehensive MRSA colonization     surveillance program. It is not     intended to diagnose MRSA     infection nor to guide or     monitor treatment for     MRSA infections.      Studies:  Recent x-ray studies have been reviewed in detail by the Attending Physician   Erin Hearing, ANP [facility] Office  [phone] Pager [phone]  **If unable to reach the above provider after paging please contact the [place] @ [phone]  On-Call/Text Page:      [email or web address]      password [email or web address]  If 7PM-7AM, please contact night-coverage [email or web address] Password [email or web address] 04/03/2013, 11:50 AM   LOS: 2 days   I have personally examined this patient and reviewed the entire database. I have reviewed the above note, made any necessary editorial changes, and agree with  its content.  Cherene Altes, MD [facility]

## 2013-04-03 NOTE — Consult Note (Signed)
Reason for Consult: Diarrhea and hematochezia Referring Physician: [facility]  [name] HPI: This is a 72 year old female who is well-known to me admitted for complaints of nausea, vomiting, and diarrhea.  The patient's symptoms started shortly after eating at [employer].  Upon presentation the patient was noted to have an elevated creatinine at 3.0, which has dropped down to 1.29 with IV hydration.  She also developed hematochezia with her diarrhea.  On 04/01/2012 she underwent an EGD/Colonoscopy for an anemia work up, which was negative.  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  The patient reports that her diarrhea has improved.  She was not able to quantify for me the amount of hematochezia.  Past Medical History  Diagnosis Date  . Asthma   . Reflux   . Hiatal hernia   . Peripheral vascular disease   . Hypertension   . Hyperlipidemia   . Iliac artery aneurysm   . Aortoiliac occlusive disease   . 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 Carotid artery occlusion   . Myocardial infarction 01/01/2000    Cardiac catheterization  . COPD (chronic obstructive pulmonary disease)   . Coronary artery disease   . Arnold-Chiari malformation 1998    Past Surgical History  Procedure Laterality Date  . Eye surgery    . Joint replacement      shoulder  . Coronary artery bypass graft  01/01/2000  . Post coronary artery  bpg  01/05/2000    Right jugular sheath removed  . Carotid endarterectomy  03/29/2010    Left  CEA  . Pr vein bypass graft,aorto-fem-pop    . Arnold-chiari malformation repair  1998    Family History  Problem Relation Age of Onset  . Heart disease Mother     Heart Disease before age 53  . Hypertension Mother   . Hyperlipidemia Mother   . Heart attack Mother   . Clotting disorder Mother   . Heart disease Father     Heart Disease before age 60  . Heart attack Father   . Heart disease Brother    Heart Disease before age 59  . Hyperlipidemia Brother   . Hypertension Brother   . Clotting disorder Brother     Social History:  reports that she quit smoking about 12 years ago. Her smoking use included Cigarettes. She has a 30 pack-year smoking history. She has never used smokeless tobacco. She reports that she does not drink alcohol or use illicit drugs.  Allergies:  Allergies  Allergen Reactions  . Codeine Other (See Comments)    Dr. Terrence Dupont advised patient not to take this medication  . Oxycodone-Acetaminophen Other (See Comments)    Says it makes her feel weird  . Avelox [Moxifloxacin Hcl In Nacl] Palpitations    Medications:  Scheduled: . arformoterol  7.5 mcg Nebulization Q12H  . budesonide  0.25 mg Nebulization BID  . ciprofloxacin  400 mg Intravenous Q24H  . clopidogrel  75 mg Oral Daily  . diphenoxylate-atropine  1 tablet Oral TID AC  . metronidazole  500 mg Intravenous Q8H  . pantoprazole  40 mg Oral Daily  . sodium chloride  3 mL Intravenous Q12H   Continuous: . 0.9 % NaCl with KCl 20 mEq / L 125 mL/hr at 04/03/13 0730    Results for orders placed during the hospital encounter of 04/01/13 (from the past 24 hour(s))  CBC  Status: Abnormal   Collection Time    04/03/13  5:30 AM      Result Value Range   WBC 6.2  4.0 - 10.5 K/uL   RBC 3.00 (*) 3.87 - 5.11 MIL/uL   Hemoglobin 8.5 (*) 12.0 - 15.0 g/dL   HCT 25.0 (*) 36.0 - 46.0 %   MCV 83.3  78.0 - 100.0 fL   MCH 28.3  26.0 - 34.0 pg   MCHC 34.0  30.0 - 36.0 g/dL   RDW 15.2  11.5 - 15.5 %   Platelets 177  150 - 400 K/uL  BASIC METABOLIC PANEL     Status: Abnormal   Collection Time    04/03/13  7:58 AM      Result Value Range   Sodium 140  135 - 145 mEq/L   Potassium 4.5  3.5 - 5.1 mEq/L   Chloride 114 (*) 96 - 112 mEq/L   CO2 19  19 - 32 mEq/L   Glucose, Bld 92  70 - 99 mg/dL   BUN 14  6 - 23 mg/dL   Creatinine, Ser 1.29 (*) 0.50 - 1.10 mg/dL   Calcium 8.4  8.4 - 10.5 mg/dL   GFR calc non Af Amer  41 (*) >90 mL/min   GFR calc Af Amer 47 (*) >90 mL/min     No results found.  ROS:  As stated above in the HPI otherwise negative.  Blood pressure 154/82, pulse 70, temperature 98.2 F (36.8 C), temperature source Oral, resp. rate 14, height 5\' 2"  (1.575 m), weight 165 lb 12.6 oz (75.2 kg), SpO2 96.00%.    PE: Gen: NAD, Alert and Oriented HEENT:  [place]/AT, EOMI Neck: Supple, no LAD Lungs: CTA Bilaterally CV: RRR without M/G/R ABM: Soft, NTND, +BS Ext: No C/C/E Rectal: brown semi-liquid stool  Assessment/Plan: 1) Hematochezia. 2) Diarrhea. 3) History of anemia.   I agree with the use of antibiotics as I feel her symptoms are infectious.  Clinically she reports that she is improving.  The rectal examination did not reveal any overt evidence of blood.   Plan: 1) Continue with antibiotics. 2) Continue with IV fluids. 3) Hold on any endoscopic procedures at this time. 4) Monitor HGB.  Dameon Soltis D 04/03/2013, 2:01 PM

## 2013-04-04 DIAGNOSIS — R112 Nausea with vomiting, unspecified: Secondary | ICD-10-CM

## 2013-04-04 DIAGNOSIS — N179 Acute kidney failure, unspecified: Secondary | ICD-10-CM

## 2013-04-04 DIAGNOSIS — R197 Diarrhea, unspecified: Secondary | ICD-10-CM

## 2013-04-04 DIAGNOSIS — A020 Salmonella enteritis: Principal | ICD-10-CM

## 2013-04-04 LAB — CBC
HCT: 27.4 % — ABNORMAL LOW (ref 36.0–46.0)
Hemoglobin: 9.2 g/dL — ABNORMAL LOW (ref 12.0–15.0)
MCV: 84.6 fL (ref 78.0–100.0)
RDW: 15.6 % — ABNORMAL HIGH (ref 11.5–15.5)
WBC: 7.1 10*3/uL (ref 4.0–10.5)

## 2013-04-04 LAB — BASIC METABOLIC PANEL
BUN: 6 mg/dL (ref 6–23)
CO2: 20 mEq/L (ref 19–32)
Chloride: 110 mEq/L (ref 96–112)
Creatinine, Ser: 1.06 mg/dL (ref 0.50–1.10)
GFR calc Af Amer: 60 mL/min — ABNORMAL LOW (ref >90)
Potassium: 4.9 mEq/L (ref 3.5–5.1)

## 2013-04-04 MED ORDER — CIPROFLOXACIN IN D5W 400 MG/200ML IV SOLN
400.0000 mg | Freq: Two times a day (BID) | INTRAVENOUS | Status: DC
  Administered 2013-04-04 – 2013-04-05 (×3): 400 mg via INTRAVENOUS
  Filled 2013-04-04 (×4): qty 200

## 2013-04-04 MED ORDER — RISAQUAD PO CAPS
2.0000 | ORAL_CAPSULE | Freq: Every day | ORAL | Status: DC
  Administered 2013-04-04 – 2013-04-06 (×3): 2 via ORAL
  Filled 2013-04-04 (×4): qty 2

## 2013-04-04 NOTE — Progress Notes (Signed)
ANTIBIOTIC CONSULT NOTE - FOLLOW UP  Pharmacy Consult for Cipro Indication: salmonella enteritis  Allergies  Allergen Reactions  . Codeine Other (See Comments)    Dr. Terrence Dupont advised patient not to take this medication  . Oxycodone-Acetaminophen Other (See Comments)    Says it makes her feel weird  . Avelox [Moxifloxacin Hcl In Nacl] Palpitations    Patient Measurements: Height: 5\' 2"  (157.5 cm) Weight: 165 lb 12.6 oz (75.2 kg) IBW/kg (Calculated) : 50.1  Vital Signs: Temp: 98.8 F (37.1 C) (08/23 1145) Temp src: Oral (08/23 0819) BP: 114/38 mmHg (08/23 1145) Pulse Rate: 59 (08/23 1145) Intake/Output from previous day: 08/22 0701 - 08/23 0700 In: [id number] [P.O.:2060; I.V.:2750; IV Piggyback:400] Out: 3050 [Urine:3050] Intake/Output from this shift: Total I/O In: 920 [P.O.:720; I.V.:200] Out: 900 [Urine:900]  Labs:  Recent Labs  04/02/13 0430 04/03/13 0530 04/03/13 0758 04/04/13 0505  WBC 6.5 6.2  --  7.1  HGB 9.0* 8.5*  --  9.2*  PLT 185 177  --  209  CREATININE 2.72*  --  1.29* 1.06   Estimated Creatinine Clearance: 46.9 ml/min (by C-G formula based on Cr of 1.06).    Microbiology: Recent Results (from the past 720 hour(s))  CLOSTRIDIUM DIFFICILE BY PCR     Status: None   Collection Time    04/01/13  1:53 PM      Result Value Range Status   C difficile by pcr NEGATIVE  NEGATIVE Final  STOOL CULTURE     Status: None   Collection Time    04/01/13  1:53 PM      Result Value Range Status   Specimen Description STOOL   Final   Special Requests NONE   Final   Culture     Final   Value: Culture reincubated for better growth     Performed at [facility]   Report Status PENDING   Incomplete  MRSA PCR SCREENING     Status: None   Collection Time    04/01/13  2:33 PM      Result Value Range Status   MRSA by PCR NEGATIVE  NEGATIVE Final   Comment:            The GeneXpert MRSA Assay (FDA     approved for NASAL specimens     only), is one component  of a     comprehensive MRSA colonization     surveillance program. It is not     intended to diagnose MRSA     infection nor to guide or     monitor treatment for     MRSA infections.    Anti-infectives   Start     Dose/Rate Route Frequency Ordered Stop   04/01/13 1700  ciprofloxacin (CIPRO) IVPB 400 mg     400 mg 200 mL/hr over 60 Minutes Intravenous Every 24 hours 04/01/13 1527     04/01/13 1630  metroNIDAZOLE (FLAGYL) IVPB 500 mg  Status:  Discontinued     500 mg 100 mL/hr over 60 Minutes Intravenous Every 8 hours 04/01/13 1516 04/03/13 1740      Assessment: 72 yo F on IV Cipro day #4 for salmonella enteritis.  Renal function has improved since admission with rehydration.  SCr 1.09 today with CrCl ~ 50 ml/min.    Noted plans to advance diet today and possibly change to oral antibiotics tomorrow depending on how well she tolerates oral diet.  Goal of Therapy:  Renal dose adjustment of antibiotics.  Plan:  Change Cipro to 400 mg IV q12h.   Follow-up diet and change to Cipro 500mg  PO BID when tolerating POs. Follow-up length of therapy/stop date.  [employer], Pharm.D., BCPS Clinical Pharmacist Pager [phone] 04/04/2013 2:17 PM

## 2013-04-04 NOTE — Progress Notes (Signed)
[facility] Progress Note [place] TEAM 1 - Stepdown ICU Team   [name] [id number] DOB: October 10, 1940 DOA: 04/01/2013 PCP: [name], MD  Brief narrative: 72 year old female patient who ate an egg sandwich at [employer] 5 days prior to presentation. Within one hour after eating the sandwich she developed significant nausea vomiting abdominal pain and diarrhea. These symptoms persisted. She had seen her family doctor for 2 visits. Was given IV fluid, labs were obtained and a CT abdomen with IV contrast was completed. This CT was done in an outside facility and showed no explanation for patient's diarrhea. Several days before admission she was started on Cipro and Flagyl to treat any potential infectious etiology. She had no sick contacts. Her stool had grossly red visible blood. Upon presentation to the ER her creatinine was elevated at 3.0. Her baseline creatinine is 0.77. She was also hypotensive with systolic readings less than 90 in the emergency department.  Since admission the pt has been found to have salmonella enteritis.  She continues on Cipro IV and has been slow to note decrease in diarrhea or nausea.  Her volume has been resuscitated, and with such her renal function has normalized.    Assessment/Plan:  Salmonellla enteritis  Continue IV cipro until oral intake more consistent - make another attempt at advancing diet slowly   BRBPR May simply be due to above, as Salmonella is a known culprit for bloody diarrhea - GI is following - hemorrhoid irritation in setting of recurrent diarrhea also in the differential - resume plavix and follow   AKI (acute kidney injury) Baseline crt 0.77 - peak 3.19 this admit - has now normalized - suspect combination of prerenal from dehydration diarrhea as well as contrast-induced nephropathy  Hypotension Secondary to volume depletion - resolved w/ volume resuscitation  Hypokalemia/Hyponatremia Due to GI losses  from profound diarrhea - stable at this time   COPD GOLD II Followed by Dr. Melvyn Novas as an outpatient - well compensated at present   Peripheral vascular disease, unspecified history of prior carotid endarterectomy in 2011 - saw Dr. Doren Custard in the office May 2014 with noted carotid endarterectomy site widely patent  CAD  Followed as an outpatient by Dr. Terrence Dupont and currently asymptomatic - normal Myoview scan in June of 2014 with EF documented as 74%   DVT prophylaxis: SCDs Code Status: FULL Family Communication: no family present at time of exam  Disposition Plan:  Transfer to medical bed - begin PT/OT - advance diet - home when able to eat, stable on feet, and no more bloody diarrhea (anticitpate 2-3 days)  Consultants: Gastroenterology - Benson Norway  Procedures: None  Antibiotics: Flagyl 8/20 >>>8/22 Cipro 8/20 >>>  HPI/Subjective: Pt is having chills this morning, with associated nausea, but no vomiting.  Has not had diarrhea thus far today.  Denies cp or sob.   Objective: Blood pressure 146/51, pulse 68, temperature 99.2 F (37.3 C), temperature source Oral, resp. rate 16, height 5\' 2"  (1.575 m), weight 75.2 kg (165 lb 12.6 oz), SpO2 89.00%.  Intake/Output Summary (Last 24 hours) at 04/04/13 0914 Last data filed at 04/04/13 0900  Gross per 24 hour  Intake   3895 ml  Output   3700 ml  Net    195 ml    Exam: General: No acute respiratory distress Lungs: Clear to auscultation bilaterally without wheezes or crackles Cardiovascular: Regular rate and rhythm without murmur gallop or rub normal S1 and S2, no peripheral edema or JVD Abdomen:  Nontender, nondistended, soft, bowel sounds positive, no rebound, no ascites, no appreciable mass Musculoskeletal: No significant cyanosis, clubbing of bilateral lower extremities Neurological: Alert and oriented x 3, moves all extremities x 4 without focal neurological deficits, CN 2-12 intact  Scheduled Meds:  Scheduled Meds: . arformoterol   7.5 mcg Nebulization Q12H  . budesonide  0.25 mg Nebulization BID  . ciprofloxacin  400 mg Intravenous Q24H  . clopidogrel  75 mg Oral Daily  . pantoprazole  40 mg Oral Daily  . sodium chloride  3 mL Intravenous Q12H   Data Reviewed: Basic Metabolic Panel:  Recent Labs Lab 04/01/13 1150 04/02/13 0010 04/02/13 0430 04/03/13 0758 04/04/13 0505  NA 131* 138 133* 140 138  K 3.2* 3.9 3.1* 4.5 4.9  CL 96 111 103 114* 110  CO2 20 18* 18* 19 20  GLUCOSE 102* 88 96 92 93  BUN 35* 20 36* 14 6  CREATININE 3.19* 1.47* 2.72* 1.29* 1.06  CALCIUM 8.9 8.0* 7.7* 8.4 8.6   Liver Function Tests:  Recent Labs Lab 04/01/13 1150 04/02/13 0010 04/02/13 0430  AST 20 14 16   ALT 10 8 8   ALKPHOS 61 48 50  BILITOT 0.3 0.1* 0.2*  PROT 6.6 5.2* 5.4*  ALBUMIN 3.0* 2.2* 2.4*    Recent Labs Lab 04/01/13 1150  LIPASE 32   CBC:  Recent Labs Lab 04/01/13 1150 04/02/13 0010 04/02/13 0430 04/03/13 0530 04/04/13 0505  WBC 7.6 6.1 6.5 6.2 7.1  NEUTROABS 4.6  --   --   --   --   HGB 11.5* 8.6* 9.0* 8.5* 9.2*  HCT 32.4* 25.3* 26.4* 25.0* 27.4*  MCV 81.6 83.2 82.5 83.3 84.6  PLT 196 173 185 177 209   Cardiac Enzymes:  Recent Labs Lab 04/01/13 1206  TROPONINI <0.30     Recent Results (from the past 240 hour(s))  CLOSTRIDIUM DIFFICILE BY PCR     Status: None   Collection Time    04/01/13  1:53 PM      Result Value Range Status   C difficile by pcr NEGATIVE  NEGATIVE Final  STOOL CULTURE     Status: None   Collection Time    04/01/13  1:53 PM      Result Value Range Status   Specimen Description STOOL   Final   Special Requests NONE   Final   Culture     Final   Value: Culture reincubated for better growth     Performed at [facility]   Report Status PENDING   Incomplete  MRSA PCR SCREENING     Status: None   Collection Time    04/01/13  2:33 PM      Result Value Range Status   MRSA by PCR NEGATIVE  NEGATIVE Final   Comment:            The GeneXpert MRSA Assay  (FDA     approved for NASAL specimens     only), is one component of a     comprehensive MRSA colonization     surveillance program. It is not     intended to diagnose MRSA     infection nor to guide or     monitor treatment for     MRSA infections.     Studies:  Recent x-ray studies have been reviewed in detail by the Attending Physician   Cherene Altes, MD [facility] Office  [phone] Pager [phone]  On-Call/Text Page:  [email or web address]      password [facility]  04/04/2013, 9:14 AM   LOS: 3 days

## 2013-04-04 NOTE — Progress Notes (Signed)
[facility] Progress Note for [facility], Dr. Collene Mares and Dr. Benson Norway   Subjective  Just transferred to [place] from step down Feeling better No diarrhea since mid-day yesterday.  No abd pain today.  Had mild nausea, relieved with zofran.  Slight headache now.   No BM at all today and thus no further bleeding.   Objective  Vital signs in last 24 hours: Temp:  [98.1 F (36.7 C)-99.2 F (37.3 C)] 99.2 F (37.3 C) (08/23 0819) Pulse Rate:  [67-70] 68 (08/22 2349) Resp:  [14-18] 16 (08/23 0819) BP: (113-154)/(32-82) 146/51 mmHg (08/23 0820) SpO2:  [89 %-99 %] 91 % (08/23 0820) Last BM Date: 04/02/13 Gen: awake, alert, NAD HEENT: anicteric CV: RRR Pulm: CTA  Abd: soft, NT/ND, +BS throughout Ext: no c/c/e Neuro: nonfocal  Lab Results:  Recent Labs  04/02/13 0430 04/03/13 0530 04/04/13 0505  WBC 6.5 6.2 7.1  HGB 9.0* 8.5* 9.2*  HCT 26.4* 25.0* 27.4*  PLT 185 177 209   BMET  Recent Labs  04/02/13 0430 04/03/13 0758 04/04/13 0505  NA 133* 140 138  K 3.1* 4.5 4.9  CL 103 114* 110  CO2 18* 19 20  GLUCOSE 96 92 93  BUN 36* 14 6  CREATININE 2.72* 1.29* 1.06  CALCIUM 7.7* 8.4 8.6   LFT  Recent Labs  04/02/13 0430  PROT 5.4*  ALBUMIN 2.4*  AST 16  ALT 8  ALKPHOS 50  BILITOT 0.2*   PT/INR  Recent Labs  04/02/13 0010  LABPROT 14.5  INR 1.15   Studies/Results: GI pathogen panel = ++SALMONELLA    Assessment & Plan  72 yo with COPD, PVD, HTN, HL, admitted with N/V/diarrhea, a few episodes of which were bloody now found to have salmonella enteritis  1.  Salmonella enteritis -- fluid and electrolytes replacement as needed --ABX given age and hospitalized status, normal course is 3-7 days (cipro BID) --Would given 2-4 weeks of probiotics at discharge --Monitor for further bleeding, which is likely to be low volume (if it occurs)  2.  AKI - improved    Active Problems:   COPD GOLD II   Peripheral vascular disease, unspecified   AKI (acute  kidney injury)   Hypotension   Diarrhea   Hypokalemia   Hyponatremia   Dehydration   CAD (coronary artery disease)     LOS: 3 days   Betsaida [place] M  04/04/2013, 10:18 AM

## 2013-04-04 NOTE — Progress Notes (Signed)
Pt transferred to unit 6E from [facility]. Pt is alert and oriented to staff, call bell, and room. Bed in lowest position. Call bell within reach. Full assessment to Epic. Will continue to monitor. Henry Russel, RN

## 2013-04-05 DIAGNOSIS — J449 Chronic obstructive pulmonary disease, unspecified: Secondary | ICD-10-CM

## 2013-04-05 LAB — CBC
Hemoglobin: 9.7 g/dL — ABNORMAL LOW (ref 12.0–15.0)
MCH: 28.2 pg (ref 26.0–34.0)
MCHC: 33.4 g/dL (ref 30.0–36.0)
MCV: 84.3 fL (ref 78.0–100.0)
RBC: 3.44 MIL/uL — ABNORMAL LOW (ref 3.87–5.11)

## 2013-04-05 LAB — BASIC METABOLIC PANEL
BUN: 3 mg/dL — ABNORMAL LOW (ref 6–23)
CO2: 24 mEq/L (ref 19–32)
Calcium: 8.8 mg/dL (ref 8.4–10.5)
Creatinine, Ser: 0.94 mg/dL (ref 0.50–1.10)
GFR calc non Af Amer: 60 mL/min — ABNORMAL LOW (ref >90)
Glucose, Bld: 98 mg/dL (ref 70–99)

## 2013-04-05 MED ORDER — CIPROFLOXACIN HCL 500 MG PO TABS
500.0000 mg | ORAL_TABLET | Freq: Two times a day (BID) | ORAL | Status: DC
  Administered 2013-04-05 – 2013-04-06 (×3): 500 mg via ORAL
  Filled 2013-04-05 (×5): qty 1

## 2013-04-05 NOTE — Progress Notes (Signed)
[facility] PROGRESS NOTE  [name] [id number] DOB: February 11, 1941 DOA: 04/01/2013 PCP: [name], MD  Assessment/Plan: Salmonellla enteritis  -Continue IV cipro until oral intake more consistent  -GI recs for  3-7 days of BID cipro with 2-4 weeks of probiotic coverage post discharge BRBPR  - GI following - Suspected Salmonella vs hemorrhoidal bleeding - resumed plavix and follow  AKI (acute kidney injury)  - Baseline crt 0.77 - peak 3.19 this admit - has now normalized  - likely combination of prerenal from dehydration diarrhea as well as contrast-induced nephropathy  Hypotension  Secondary to volume depletion  - resolved w/ volume resuscitation  Hypokalemia/Hyponatremia  Due to GI losses from profound diarrhea - stable at this time  COPD GOLD II  Followed by Dr. Melvyn Novas as an outpatient - well compensated at present  Peripheral vascular disease, unspecified  history of prior carotid endarterectomy in 2011 - saw Dr. Doren Custard in the office May 2014 with noted carotid endarterectomy site widely patent  CAD  Followed as an outpatient by Dr. Terrence Dupont and currently asymptomatic - normal Myoview scan in June of 2014 with EF documented as 74%  Code Status: Full Family Communication: Pt in room (indicate person spoken with, relationship, and if by phone, the number) Disposition Plan: Pending   Consultants:  GI  Antibiotics: Flagyl 8/20 >>>8/22  Cipro 8/20 >>>  HPI/Subjective: No complaints. Reports feeling ready to advance her diet.  Objective: Filed Vitals:   04/04/13 1652 04/04/13 2130 04/05/13 0552 04/05/13 0807  BP: 146/51 149/46 139/45   Pulse: 66 67 65   Temp: 98.5 F (36.9 C) 99.2 F (37.3 C) 99.4 F (37.4 C)   TempSrc:  Oral Oral   Resp: 18 20 20    Height:  5\' 2"  (1.575 m)    Weight:  75.978 kg (167 lb 8 oz)    SpO2: 94% 95% 93% 93%    Intake/Output Summary (Last 24 hours) at 04/05/13 1012 Last data filed at 04/05/13 [id number]  Gross per 24 hour   Intake   2055 ml  Output    550 ml  Net   1505 ml   Filed Weights   04/01/13 1437 04/04/13 2130  Weight: 75.2 kg (165 lb 12.6 oz) 75.978 kg (167 lb 8 oz)    Exam:   General:  Awake, in nad  Cardiovascular: regular, s1, s2  Respiratory: normal resp effort, no wheezing  Abdomen: soft, pos bowel sounds  Musculoskeletal: perfused, no clubbing   Data Reviewed: Basic Metabolic Panel:  Recent Labs Lab 04/02/13 0010 04/02/13 0430 04/03/13 0758 04/04/13 0505 04/05/13 0445  NA 138 133* 140 138 138  K 3.9 3.1* 4.5 4.9 4.8  CL 111 103 114* 110 106  CO2 18* 18* 19 20 24   GLUCOSE 88 96 92 93 98  BUN 20 36* 14 6 3*  CREATININE 1.47* 2.72* 1.29* 1.06 0.94  CALCIUM 8.0* 7.7* 8.4 8.6 8.8   Liver Function Tests:  Recent Labs Lab 04/01/13 1150 04/02/13 0010 04/02/13 0430  AST 20 14 16   ALT 10 8 8   ALKPHOS 61 48 50  BILITOT 0.3 0.1* 0.2*  PROT 6.6 5.2* 5.4*  ALBUMIN 3.0* 2.2* 2.4*    Recent Labs Lab 04/01/13 1150  LIPASE 32   No results found for this basename: AMMONIA,  in the last 168 hours CBC:  Recent Labs Lab 04/01/13 1150 04/02/13 0010 04/02/13 0430 04/03/13 0530 04/04/13 0505 04/05/13 0445  WBC 7.6 6.1 6.5 6.2 7.1 7.6  NEUTROABS 4.6  --   --   --   --   --  HGB 11.5* 8.6* 9.0* 8.5* 9.2* 9.7*  HCT 32.4* 25.3* 26.4* 25.0* 27.4* 29.0*  MCV 81.6 83.2 82.5 83.3 84.6 84.3  PLT 196 173 185 177 209 229   Cardiac Enzymes:  Recent Labs Lab 04/01/13 1206  TROPONINI <0.30   BNP (last 3 results) No results found for this basename: PROBNP,  in the last 8760 hours CBG: No results found for this basename: GLUCAP,  in the last 168 hours  Recent Results (from the past 240 hour(s))  CLOSTRIDIUM DIFFICILE BY PCR     Status: None   Collection Time    04/01/13  1:53 PM      Result Value Range Status   C difficile by pcr NEGATIVE  NEGATIVE Final  STOOL CULTURE     Status: None   Collection Time    04/01/13  1:53 PM      Result Value Range Status    Specimen Description STOOL   Final   Special Requests NONE   Final   Culture     Final   Value: Culture reincubated for better growth     Performed at [facility]   Report Status PENDING   Incomplete  MRSA PCR SCREENING     Status: None   Collection Time    04/01/13  2:33 PM      Result Value Range Status   MRSA by PCR NEGATIVE  NEGATIVE Final   Comment:            The GeneXpert MRSA Assay (FDA     approved for NASAL specimens     only), is one component of a     comprehensive MRSA colonization     surveillance program. It is not     intended to diagnose MRSA     infection nor to guide or     monitor treatment for     MRSA infections.     Studies: No results found.  Scheduled Meds: . acidophilus  2 capsule Oral Daily  . arformoterol  7.5 mcg Nebulization Q12H  . budesonide  0.25 mg Nebulization BID  . ciprofloxacin  400 mg Intravenous BID  . clopidogrel  75 mg Oral Daily  . pantoprazole  40 mg Oral Daily  . sodium chloride  3 mL Intravenous Q12H   Continuous Infusions: . 0.9 % NaCl with KCl 20 mEq / L 1 mL (04/04/13 2208)    Active Problems:   COPD GOLD II   Peripheral vascular disease, unspecified   AKI (acute kidney injury)   Hypotension   Diarrhea   Hypokalemia   Hyponatremia   Dehydration   CAD (coronary artery disease)    Time spent: 83min    Arionne Iams, [place] [facility] Pager [phone]. If 7PM-7AM, please contact night-coverage at [email or web address], password [facility] 04/05/2013, 10:12 AM  LOS: 4 days

## 2013-04-05 NOTE — Evaluation (Signed)
Physical Therapy Evaluation Patient Details Name: [name] MRN: [id number] DOB: [birth year] Today's Date: 04/05/2013 Time:  -     PT Assessment / Plan / Recommendation History of Present Illness  72 yo with COPD, PVD, HTN, HL, admitted with N/V/diarrhea, a few episodes of which were bloody now found to have salmonella enteritis with AKI.   Clinical Impression  Pt moving well and has reported ambulating in room.  Recommend pt to continue to ambulate in hallway with RN staff or family 3-4 x/day and call for assistance to go to restroom due to lines and navigating room. Pt close to baseline and has no further PT needs.  PT will sign off.     PT Assessment  Patent does not need any further PT services    Follow Up Recommendations  No PT follow up    Equipment Recommendations  None recommended by PT    Precautions / Restrictions Precautions Precautions: None Restrictions Weight Bearing Restrictions: No   Pertinent Vitals/Pain No c/o pain      Mobility  Bed Mobility Bed Mobility: Supine to Sit Supine to Sit: 7: Independent Transfers Transfers: Sit to Stand;Stand to Sit Sit to Stand: 6: Modified independent (Device/Increase time);From bed Stand to Sit: 6: Modified independent (Device/Increase time);To chair/3-in-1 Ambulation/Gait Ambulation/Gait Assistance: 5: Supervision Ambulation Distance (Feet): 150 Feet Assistive device: None Ambulation/Gait Assistance Details: Supervision for safety  Gait Pattern: Step-through pattern;Wide base of support Gait velocity: decreased Stairs: Yes Stairs Assistance: 4: Min guard Stair Management Technique: One rail Right;Forwards Number of Stairs: 4    Exercises     PT Diagnosis:    PT Problem List:   PT Treatment Interventions:       PT Goals(Current goals can be found in the care plan section) Acute Rehab PT Goals Patient Stated Goal: "I've been walking in the room going to the bathroom." PT Goal Formulation: No goals  set, d/c therapy  Visit Information  Last PT Received On: 04/05/13 Assistance Needed: +1 History of Present Illness: 72 yo with COPD, PVD, HTN, HL, admitted with N/V/diarrhea, a few episodes of which were bloody now found to have salmonella enteritis with AKI.        Prior [place] expects to be discharged to:: Private residence Living Arrangements: Children Available Help at Discharge: Family;Available 24 hours/day Type of Home: House Home Access: Stairs to enter [employer] of Steps: 9 Entrance Stairs-Rails: Left Home Layout: One level Home Equipment: None Prior Function Level of Independence: Independent Comments: Pt does not drive however son or dtr takes pt to grocery store Communication Communication: No difficulties    Cognition  Cognition Arousal/Alertness: Awake/alert Behavior During Therapy: WFL for tasks assessed/performed Overall Cognitive Status: Within Functional Limits for tasks assessed    Extremity/Trunk Assessment Lower Extremity Assessment Lower Extremity Assessment: Overall WFL for tasks assessed Cervical / Trunk Assessment Cervical / Trunk Assessment: Normal   Balance    End of Session PT - End of Session Equipment Utilized During Treatment: Gait belt Activity Tolerance: Patient tolerated treatment well Patient left: in chair;with call bell/phone within reach Nurse Communication: Mobility status  GP     Margorie Renner 04/05/2013, 9:24 AM  Antoine Poche, [place] DPT [phone]

## 2013-04-05 NOTE — Progress Notes (Signed)
Call received from [facility] from [employer], informed stool culture positive for salmonella, Dr. Wyline Copas notified.

## 2013-04-06 LAB — CBC WITH DIFFERENTIAL/PLATELET
Eosinophils Absolute: 0.2 10*3/uL (ref 0.0–0.7)
Eosinophils Relative: 3 % (ref 0–5)
Lymphs Abs: 2.2 10*3/uL (ref 0.7–4.0)
MCH: 28.7 pg (ref 26.0–34.0)
MCV: 84.5 fL (ref 78.0–100.0)
Monocytes Absolute: 0.8 10*3/uL (ref 0.1–1.0)
Monocytes Relative: 12 % (ref 3–12)
Platelets: 272 10*3/uL (ref 150–400)
RBC: 3.42 MIL/uL — ABNORMAL LOW (ref 3.87–5.11)

## 2013-04-06 LAB — COMPREHENSIVE METABOLIC PANEL
BUN: 4 mg/dL — ABNORMAL LOW (ref 6–23)
Calcium: 9.2 mg/dL (ref 8.4–10.5)
Creatinine, Ser: 0.94 mg/dL (ref 0.50–1.10)
GFR calc Af Amer: 70 mL/min — ABNORMAL LOW (ref >90)
Glucose, Bld: 92 mg/dL (ref 70–99)
Sodium: 140 mEq/L (ref 135–145)
Total Protein: 6.1 g/dL (ref 6.0–8.3)

## 2013-04-06 MED ORDER — RISAQUAD PO CAPS: 2.0000 | ORAL_CAPSULE | Freq: Every day | ORAL | Status: DC

## 2013-04-06 MED ORDER — CIPROFLOXACIN HCL 500 MG PO TABS: 500.0000 mg | ORAL_TABLET | Freq: Two times a day (BID) | ORAL | Status: AC

## 2013-04-06 NOTE — Discharge Summary (Signed)
Physician Discharge Summary  [name] [id number] DOB: 17-Nov-1940 DOA: 04/01/2013  PCP: [name], MD  Admit date: 04/01/2013 Discharge date: 04/06/2013  Time spent: 30 minutes  Recommendations for Outpatient Follow-up:  1. Follow up with PCP in 1-2 weeks  Discharge Diagnoses:  Salmonella Enteritis  Active Problems:   COPD GOLD II   Peripheral vascular disease, unspecified   AKI (acute kidney injury)   Hypotension   Diarrhea   Hypokalemia   Hyponatremia   Dehydration   CAD (coronary artery disease)  Discharge Condition: Improved  Diet recommendation: Regular  Filed Weights   04/01/13 1437 04/04/13 2130  Weight: 75.2 kg (165 lb 12.6 oz) 75.978 kg (167 lb 8 oz)    History of present illness:  [name] is a 72 y.o. female who at a egg sandwich at the [employer] 5 days ago. An hour after eating, she developed N/V/Adominal pain/dirrhea. She has been to her PCP x 2 trips since then- with IVF given, labs drawn and CT scan done- CT scan with IV contrast showed no acute findings on 8/19 (results in paper chart). Cr on 8/18 was 1.2. No WBC count. Was given cipro/flagyl (few days ago). Not better. No sick contacts. Pain in abd is diffuse and nothing makes better or worse. No recent travel.   +blood in her stool (grossly red)- +extrenal and internal hemorrhoids   Hospital Course:  The patient was admitted to the floor. The patient was noted to be hypotensive with an elevated Cr requiring IVF hydration. Given hx of diarrhea, stool studies were obtained. Gastroenterology was also consulted. The patient ultimately was found to have + salmonella in the stool. She was continued on ciprofloxacin with GI recs for 4-7 days of oral ciprofloxacin with 2-4 weeks of probiotic at discharge. The patient later tolerated a regular diet and was otherwise stable for close outpatient follow up.  Consultations:  Gastroenterology  Discharge Exam: Filed Vitals:   04/05/13 1947  04/05/13 2211 04/06/13 0409 04/06/13 0817  BP:  132/72 117/35 115/43  Pulse:  18 57 64  Temp:  99 F (37.2 C) 98.7 F (37.1 C) 98.8 F (37.1 C)  TempSrc:  Oral Oral Oral  Resp:  20 18 18   Height:      Weight:      SpO2: 99% 94% 93% 95%    General: Awake, in nad Cardiovascular: regular, s1, s2 Respiratory: normal resp effort, no wheezing  Discharge Instructions   Future Appointments Provider Department Dept Phone   01/06/2014 3:00 PM [facility] [phone]   01/06/2014 4:00 PM [name], MD [facility] [phone]       Medication List         acidophilus Caps capsule  Take 2 capsules by mouth daily.     albuterol (2.5 MG/3ML) 0.083% nebulizer solution  Commonly known as:  PROVENTIL  Take 2.5 mg by nebulization every 4 (four) hours as needed for wheezing or shortness of breath (((PLAN C))).     VENTOLIN HFA 108 (90 BASE) MCG/ACT inhaler  Generic drug:  albuterol  Inhale 2 puffs into the lungs every 4 (four) hours as needed for wheezing or shortness of breath (((PLAN B))).     amLODipine-olmesartan 10-40 MG per tablet  Commonly known as:  AZOR  Take 1 tablet by mouth daily.     budesonide 0.25 MG/2ML nebulizer solution  Commonly known as:  PULMICORT  One twice daily with perforomist  cetirizine 1 MG/ML syrup  Commonly known as:  ZYRTEC  Take 5 mg by mouth as needed (allergies).     ciprofloxacin 500 MG tablet  Commonly known as:  CIPRO  Take 1 tablet (500 mg total) by mouth 2 (two) times daily.     clopidogrel 75 MG tablet  Commonly known as:  PLAVIX  Take 1 tablet (75 mg total) by mouth daily.     dexlansoprazole 60 MG capsule  Commonly known as:  DEXILANT  Take 1 capsule (60 mg total) by mouth daily before breakfast.     nitroGLYCERIN 0.4 MG SL tablet  Commonly known as:  NITROSTAT  Place 0.4 mg under the tongue continuous as needed for chest pain.      PERFOROMIST 20 MCG/2ML nebulizer solution  Generic drug:  formoterol  Take 20 mcg by nebulization daily. Half dose per day.     rosuvastatin 10 MG tablet  Commonly known as:  CRESTOR  Take 10 mg by mouth at bedtime.       Allergies  Allergen Reactions  . Codeine Other (See Comments)    Dr. Terrence Dupont advised patient not to take this medication  . Oxycodone-Acetaminophen Other (See Comments)    Says it makes her feel weird  . Avelox [Moxifloxacin Hcl In Nacl] Palpitations       Follow-up Information   Follow up with BOUSKA,DAVID E, MD In 1 week.   Specialty:  Family Medicine   Contact information:   [address] [address] [phone]        The results of significant diagnostics from this hospitalization (including imaging, microbiology, ancillary and laboratory) are listed below for reference.    Significant Diagnostic Studies: Dg Abd Acute W/chest  04/01/2013   *RADIOLOGY REPORT*  Clinical Data: Abdominal pain with nausea and vomiting.  ACUTE ABDOMEN SERIES (ABDOMEN 2 VIEW & CHEST 1 VIEW)  Comparison: None.  Findings: Prior CABG.  Normal heart size.  Mild scarring without infiltrates or failure.  No effusion or pneumothorax.  Flat decubitus abdomen reveals no free air or obstruction.  Normal excretory pattern of the kidneys.  Bilateral vascular stents are seen in the iliac regions.  Prior cholecystectomy.  Negative osseous structures.  IMPRESSION: No active cardiopulmonary disease.  No free air or obstruction.   Original Report Authenticated By: Rolla Flatten, M.D.    Microbiology: Recent Results (from the past 240 hour(s))  CLOSTRIDIUM DIFFICILE BY PCR     Status: None   Collection Time    04/01/13  1:53 PM      Result Value Range Status   C difficile by pcr NEGATIVE  NEGATIVE Final  STOOL CULTURE     Status: None   Collection Time    04/01/13  1:53 PM      Result Value Range Status   Specimen Description STOOL   Final   Special Requests NONE    Final   Culture     Final   Value: SALMONELLA SPECIES     Note: CRITICAL RESULT CALLED TO, READ BACK BY AND VERIFIED WITH: VALENCIA AT 555PM 04/05/13 VINCJ     Performed at [facility]   Report Status PENDING   Incomplete   Organism ID, Bacteria SALMONELLA SPECIES   Final  MRSA PCR SCREENING     Status: None   Collection Time    04/01/13  2:33 PM      Result Value Range Status   MRSA by PCR NEGATIVE  NEGATIVE Final  Comment:            The GeneXpert MRSA Assay (FDA     approved for NASAL specimens     only), is one component of a     comprehensive MRSA colonization     surveillance program. It is not     intended to diagnose MRSA     infection nor to guide or     monitor treatment for     MRSA infections.     Labs: Basic Metabolic Panel:  Recent Labs Lab 04/02/13 0430 04/03/13 0758 04/04/13 0505 04/05/13 0445 04/06/13 0518  NA 133* 140 138 138 140  K 3.1* 4.5 4.9 4.8 4.3  CL 103 114* 110 106 104  CO2 18* 19 20 24 27   GLUCOSE 96 92 93 98 92  BUN 36* 14 6 3* 4*  CREATININE 2.72* 1.29* 1.06 0.94 0.94  CALCIUM 7.7* 8.4 8.6 8.8 9.2   Liver Function Tests:  Recent Labs Lab 04/01/13 1150 04/02/13 0010 04/02/13 0430 04/06/13 0518  AST 20 14 16 24   ALT 10 8 8 11   ALKPHOS 61 48 50 55  BILITOT 0.3 0.1* 0.2* 0.3  PROT 6.6 5.2* 5.4* 6.1  ALBUMIN 3.0* 2.2* 2.4* 2.7*    Recent Labs Lab 04/01/13 1150  LIPASE 32   No results found for this basename: AMMONIA,  in the last 168 hours CBC:  Recent Labs Lab 04/01/13 1150  04/02/13 0430 04/03/13 0530 04/04/13 0505 04/05/13 0445 04/06/13 0518  WBC 7.6  < > 6.5 6.2 7.1 7.6 6.8  NEUTROABS 4.6  --   --   --   --   --  3.6  HGB 11.5*  < > 9.0* 8.5* 9.2* 9.7* 9.8*  HCT 32.4*  < > 26.4* 25.0* 27.4* 29.0* 28.9*  MCV 81.6  < > 82.5 83.3 84.6 84.3 84.5  PLT 196  < > 185 177 209 229 272  < > = values in this interval not displayed. Cardiac Enzymes:  Recent Labs Lab 04/01/13 1206  TROPONINI <0.30    BNP: BNP (last 3 results) No results found for this basename: PROBNP,  in the last 8760 hours CBG: No results found for this basename: GLUCAP,  in the last 168 hours   Signed:  CHIU, STEPHEN K  [facility] 04/06/2013, 9:40 AM

## 2013-04-06 NOTE — Progress Notes (Signed)
IV team paged for second time (8/24 and 8/25) for IV restart. IV RN confirmed pt was on restart list. Will inform day shift RN.

## 2013-04-06 NOTE — Evaluation (Signed)
Occupational Therapy Evaluation Patient Details Name: [name] MRN: [id number] DOB: 1940-10-10 Today's Date: 04/06/2013 Time: [id number] OT Time Calculation (min): 26 min  OT Assessment / Plan / Recommendation History of present illness 72 yo with COPD, PVD, HTN, HL, admitted with N/V/diarrhea, a few episodes of which were bloody now found to have salmonella enteritis with AKI.    Clinical Impression   Pt performed ADL's this am for bathing, dressing & toileting at mod I level. Pt was educated in energy conservation tech's and advised to have family present when performing shower/tub transfers. She verbalized understanding of this and stated that her son or daughter "are always home before I get into the shower/tub." Brief discussion of DME, A/E to which pt reports no needs at this time. Pt close to baseline level & will have family PRN assist, no needs, will sign off OT.    OT Assessment  Patient does not need any further OT services    Follow Up Recommendations  No OT follow up    Barriers to Discharge      Equipment Recommendations  None recommended by OT    Recommendations for Other Services    Frequency       Precautions / Restrictions Precautions Precautions: None Restrictions Weight Bearing Restrictions: No   Pertinent Vitals/Pain No pain   ADL  Eating/Feeding: Performed;Independent Where Assessed - Eating/Feeding: Chair Grooming: Performed;Wash/dry hands;Wash/dry face;Brushing hair;Independent Where Assessed - Grooming: Unsupported sitting Upper Body Bathing: Performed;Chest;Right arm;Left arm;Abdomen;Modified independent Where Assessed - Upper Body Bathing: Unsupported sitting Lower Body Bathing: Performed;Modified independent;Supervision/safety Where Assessed - Lower Body Bathing: Unsupported sit to stand Upper Body Dressing: Performed;Modified independent;Set up;Minimal assistance (Min assist to button gown over IV site) Where Assessed - Upper Body Dressing:  Unsupported sitting Lower Body Dressing: Performed;Modified independent Where Assessed - Lower Body Dressing: Unsupported sit to stand;Unsupported sitting Toilet Transfer: Performed;Modified independent (OT pushed IV pole/rec supervision assist for lines) Toilet Transfer Method: Sit to stand Toilet Transfer Equipment: Comfort height toilet Toileting - Clothing Manipulation and Hygiene: Performed;Modified independent Where Assessed - Toileting Clothing Manipulation and Hygiene: Sit to stand from 3-in-1 or toilet;Standing Tub/Shower Transfer: Simulated;Supervision/safety Tub/Shower Transfer Method: Therapist, art:  (Pt has grab bar at home at tub ) Equipment Used: Gait belt Transfers/Ambulation Related to ADLs: Pt overall mod I functional mobility and transfers (assist only for IV pole during assessment, however pt reports that she has been independent in room "For a few days now, I go to the bathroom by myself, someone walks with me in the halls though"). ADL Comments: Pt performed ADL's this am for bathing, dressing & toileting at mod I level. Pt was educated in energy conservation tech's and advised to have family present when performing shower/tub transfers. She verbalized understanding of this and stated that her son or daughter "are always home before I get into the shower/tub." Brief discussion of DME, A/E to which pt reports no needs at this time.     OT Diagnosis:    OT Problem List:   OT Treatment Interventions:     OT Goals(Current goals can be found in the care plan section) Acute Rehab OT Goals Patient Stated Goal: "I've been walking in the room going to the bathroom."  Visit Information  Last OT Received On: 04/06/13 History of Present Illness: 72 yo with COPD, PVD, HTN, HL, admitted with N/V/diarrhea, a few episodes of which were bloody now found to have salmonella enteritis with AKI.  Prior Joy  expects to be discharged to:: Private residence Living Arrangements: Children Available Help at Discharge: Family;Available 24 hours/day Type of Home: House Home Access: Stairs to enter [employer] of Steps: 5 Entrance Stairs-Rails: Left Home Layout: One level Home Equipment: Grab bars - tub/shower;Hand held shower head Prior Function Level of Independence: Independent Comments: Pt does not drive however son or dtr takes pt to grocery store Communication Communication: No difficulties Dominant Hand: Right    Vision/Perception Vision - History Baseline Vision: Legally blind (R eye blindness, glasses for reading) Patient Visual Report: No change from baseline   Cognition  Cognition Arousal/Alertness: Awake/alert Behavior During Therapy: WFL for tasks assessed/performed Overall Cognitive Status: Within Functional Limits for tasks assessed    Extremity/Trunk Assessment Upper Extremity Assessment Upper Extremity Assessment: Overall WFL for tasks assessed Lower Extremity Assessment Lower Extremity Assessment: Overall WFL for tasks assessed Cervical / Trunk Assessment Cervical / Trunk Assessment: Normal    Mobility Bed Mobility Bed Mobility: Not assessed (Pt up in chair) Transfers Transfers: Sit to Stand;Stand to Sit Sit to Stand: 6: Modified independent (Device/Increase time);From chair/3-in-1;From toilet;Without upper extremity assist Stand to Sit: 6: Modified independent (Device/Increase time);To chair/3-in-1;To toilet;Without upper extremity assist Details for Transfer Assistance: Pt mod I for transfers overall, reports that she has been mod I in room "going to the bathroom for the last day or so" w/o assist.            End of Session OT - End of Session Equipment Utilized During Treatment: Gait belt Patient left: in chair;with call bell/phone within reach  Garrison, Novice 04/06/2013, 8:40 AM

## 2013-04-07 ENCOUNTER — Ambulatory Visit: Payer: Medicare Other | Admitting: Neurology

## 2013-05-07 LAB — STOOL CULTURE

## 2013-05-28 ENCOUNTER — Encounter: Payer: Self-pay | Admitting: Neurology

## 2013-05-28 ENCOUNTER — Ambulatory Visit (INDEPENDENT_AMBULATORY_CARE_PROVIDER_SITE_OTHER): Payer: Medicare Other | Admitting: Neurology

## 2013-05-28 ENCOUNTER — Encounter (INDEPENDENT_AMBULATORY_CARE_PROVIDER_SITE_OTHER): Payer: Self-pay

## 2013-05-28 VITALS — BP 159/71 | HR 63 | Ht 63.0 in | Wt 167.0 lb

## 2013-05-28 DIAGNOSIS — M531 Cervicobrachial syndrome: Secondary | ICD-10-CM

## 2013-05-28 DIAGNOSIS — M5481 Occipital neuralgia: Secondary | ICD-10-CM

## 2013-05-28 DIAGNOSIS — R51 Headache: Secondary | ICD-10-CM

## 2013-05-28 HISTORY — DX: Occipital neuralgia: M54.81

## 2013-05-28 MED ORDER — GABAPENTIN 300 MG PO CAPS: ORAL_CAPSULE | ORAL | Status: DC

## 2013-05-28 NOTE — Progress Notes (Signed)
Reason for visit: Headache, near syncope  [name] is a 72 y.o. 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 The patient indicates that 3 or 4 months ago, she began having sharp jabbing headaches coming up from the back of the head, on the right greater than left side. The episodes last about 5 minutes, and then go away. The patient indicates that she has had some episodes of near-syncope within the last month or so, generally associated with the headaches. The patient will get a weak sensation in the legs, and a sensation that she may fall. The patient has had some feeling that she might black out. The patient indicates that often times, the events of headache or near syncope may occur shortly after standing up. The patient has not actually blacked out. The patient denies any neck stiffness, and she denies any pain going down the arms or legs. The patient has had no change in balance, and she has had no problems controlling the bowels or the bladder. The patient may get a sensation of numbness of the face before the episodes of headache and near-syncope. The patient believes there may be some alteration in vision with these episodes. The patient has undergone a CAT scan of the brain that she was told was unremarkable. MRI evaluation has not been done. The patient has a history of cerebrovascular disease, with a prior left carotid endarterectomy. Recent carotid Doppler studies have been unremarkable. The patient indicates that she does not have palpitations of the heart, and she has been seen by her cardiologist, and was told that there were no cardiac issues. A recent 2-D echocardiogram was done, and she claims this study was unremarkable.  Past Medical History  Diagnosis Date  . Asthma   . Reflux   . Hiatal hernia   . Peripheral vascular disease   .  Hypertension   . Hyperlipidemia   . Iliac artery aneurysm   . Aortoiliac occlusive disease   . 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 Carotid artery occlusion   . Myocardial infarction 01/01/2000    Cardiac catheterization  . COPD (chronic obstructive pulmonary disease)   . Coronary artery disease   . Arnold-Chiari malformation 1998  . 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  . Gastroesophageal reflux disease     Past Surgical History  Procedure Laterality Date  . Eye surgery      Laser surgery for retinal hemorrhage  . Rotator cuff repair      Right  . Coronary artery bypass graft  01/01/2000  . Post coronary artery  bpg  01/05/2000    Right jugular sheath removed  . Carotid endarterectomy  03/29/2010    Left  CEA  . Pr vein bypass graft,aorto-fem-pop    . Arnold-chiari malformation repair  1998    Suboccipital craniectomy  . Corneal transplant      Right  . Appendectomy    . Cholecystectomy    . Abdominal hysterectomy      Family History  Problem Relation Age of Onset  . Heart disease Mother     Heart Disease before age 46  . Hypertension Mother   . Hyperlipidemia Mother   . Heart attack Mother   . Clotting disorder Mother   . Heart disease Father     Heart Disease before age 40  . Heart attack Father   .  Heart disease Brother     Heart Disease before age 81  . Hyperlipidemia Brother   . Hypertension Brother   . Clotting disorder Brother   . Cerebral aneurysm Sister   . Asthma Sister   . Cerebral aneurysm Brother   . Cancer Brother   . Heart disease Brother   . Heart disease Brother   . Heart disease Brother     Social history:  reports that she quit smoking about 12 years ago. Her smoking use included Cigarettes. She has a 30 pack-year smoking history. She has never used smokeless tobacco. She reports that she does not drink alcohol or use illicit drugs.  Medications:  Current Outpatient Prescriptions on File Prior to Visit  Medication Sig  Dispense Refill  . acidophilus (RISAQUAD) CAPS capsule Take 2 capsules by mouth daily.  40 capsule  0  . albuterol (PROVENTIL) (2.5 MG/3ML) 0.083% nebulizer solution Take 2.5 mg by nebulization every 4 (four) hours as needed for wheezing or shortness of breath (((PLAN C))).       . albuterol (VENTOLIN HFA) 108 (90 BASE) MCG/ACT inhaler Inhale 2 puffs into the lungs every 4 (four) hours as needed for wheezing or shortness of breath (((PLAN B))).       Marland Kitchen amLODipine-olmesartan (AZOR) 10-40 MG per tablet Take 1 tablet by mouth daily.      . budesonide (PULMICORT) 0.25 MG/2ML nebulizer solution One twice daily with perforomist  120 mL  12  . cetirizine (ZYRTEC) 1 MG/ML syrup Take 5 mg by mouth as needed (allergies).      . clopidogrel (PLAVIX) 75 MG tablet Take 1 tablet (75 mg total) by mouth daily.  90 tablet  3  . dexlansoprazole (DEXILANT) 60 MG capsule Take 1 capsule (60 mg total) by mouth daily before breakfast.  30 capsule  11  . formoterol (PERFOROMIST) 20 MCG/2ML nebulizer solution Take 20 mcg by nebulization daily. Half dose per day.      . nitroGLYCERIN (NITROSTAT) 0.4 MG SL tablet Place 0.4 mg under the tongue continuous as needed for chest pain.       . rosuvastatin (CRESTOR) 10 MG tablet Take 10 mg by mouth at bedtime.        No current facility-administered medications on file prior to visit.      Allergies  Allergen Reactions  . Codeine Other (See Comments)    Dr. Terrence Dupont advised patient not to take this medication  . Oxycodone-Acetaminophen Other (See Comments)    Says it makes her feel weird  . Avelox [Moxifloxacin Hcl In Nacl] Palpitations    ROS:  Out of a complete 14 system review of symptoms, the patient complains only of the following symptoms, and all other reviewed systems are negative.  Chills, weight loss Moles Blurred vision, loss of vision Shortness of breath, wheezing Feeling cold, increased thirst Allergies Headache, numbness, weakness,  dizziness Insomnia, decreased energy, change in appetite, restless legs  Blood pressure 159/71, pulse 63, height 5\' 3"  (1.6 m), weight 167 lb (75.751 kg).   Blood pressure standing, right arm, is 192/50. Blood pressure sitting, right arm, is 174/60.  Physical Exam  General: The patient is alert and cooperative at the time of the examination.  Head: Pupils are equal, round, and reactive to light. Discs are flat bilaterally.  Neck: The neck is supple, no carotid bruits are noted.  Respiratory: The respiratory examination is clear.  Cardiovascular: The cardiovascular examination reveals a regular rate and rhythm, no obvious murmurs or rubs are  noted.  Neuromuscular: The patient lacks about 30 lateral rotation to the left, 15 of rotation to the right with the cervical spine.  Skin: Extremities are without significant edema.  Neurologic Exam  Mental status:  Cranial nerves: Facial symmetry is present. There is good sensation of the face to pinprick and soft touch bilaterally. The strength of the facial muscles and the muscles to head turning and shoulder shrug are normal bilaterally. Speech is well enunciated, no aphasia or dysarthria is noted. Extraocular movements are full. Visual fields are full.  Motor: The motor testing reveals 5 over 5 strength of all 4 extremities. Good symmetric motor tone is noted throughout.  Sensory: Sensory testing is intact to pinprick, soft touch, vibration sensation, and position sense on all 4 extremities. No evidence of extinction is noted.  Coordination: Cerebellar testing reveals good finger-nose-finger and heel-to-shin bilaterally.  Gait and station: Gait is normal. Tandem gait is slightly unsteady. Romberg is negative. No drift is seen.  Reflexes: Deep tendon reflexes are symmetric and normal bilaterally. Toes are downgoing bilaterally.   Assessment/Plan:  1. Bilateral headache, possible occipital neuralgia  2. Episodic near syncope  3.  History of Arnold-Chiari malformation, suboccipital craniectomy  4. Cerebrovascular disease, left carotid endarterectomy  The patient reports episodes of sharp jabbing pains that are relatively brief, and daily in nature affecting the right greater than left occipital region. These episodes appear to be consistent with occipital neuralgia. The patient reports that the episodes of sensation of near-syncope correlate with the headache. Given the history of cerebrovascular disease, the patient will be set up for MRI of the brain, and MRA of the head. There is a prominent family history of cerebral aneurysms, making the MRA evaluation more important. The patient will be placed on gabapentin. If this is not effective, steroid injections of the occipital nerve may be done in the future. The patient otherwise will followup in 3-4 months. A sedimentation rate will be checked today.  Jill Alexanders MD 05/28/2013 9:07 PM  [facility] [address] [place], [place] [address]  Phone [phone] Fax [phone]

## 2013-05-29 ENCOUNTER — Telehealth: Payer: Self-pay | Admitting: Neurology

## 2013-05-29 MED ORDER — GABAPENTIN 100 MG PO CAPS: 100.0000 mg | ORAL_CAPSULE | Freq: Three times a day (TID) | ORAL | Status: DC

## 2013-05-29 NOTE — Telephone Encounter (Signed)
I called the patient. The 300 mg gabapentin is causing too much dizziness. We will convert to the 100 mg capsule tid.

## 2013-06-12 ENCOUNTER — Ambulatory Visit
Admission: RE | Admit: 2013-06-12 | Discharge: 2013-06-12 | Disposition: A | Discharge status: Home / Self Care | Payer: Medicare Other | Source: Ambulatory Visit | Attending: Neurology | Admitting: Neurology

## 2013-06-12 DIAGNOSIS — M5481 Occipital neuralgia: Secondary | ICD-10-CM

## 2013-06-12 DIAGNOSIS — R51 Headache: Secondary | ICD-10-CM

## 2013-06-14 ENCOUNTER — Telehealth: Payer: Self-pay | Admitting: Neurology

## 2013-06-14 NOTE — Telephone Encounter (Signed)
I called the patient. The MRI of the head looks ok, minimal SVD, MRA ok. If the gabapentin is not working, we will get occipital nerve injections.

## 2013-08-07 ENCOUNTER — Inpatient Hospital Stay (HOSPITAL_COMMUNITY)
Admission: EM | Admit: 2013-08-07 | Discharge: 2013-08-09 | DRG: 192 | Disposition: A | Discharge status: Home Health | Payer: Medicare Other | Attending: Internal Medicine | Admitting: Internal Medicine

## 2013-08-07 ENCOUNTER — Encounter (HOSPITAL_COMMUNITY): Payer: Self-pay | Admitting: Emergency Medicine

## 2013-08-07 ENCOUNTER — Emergency Department (HOSPITAL_COMMUNITY): Payer: Medicare Other

## 2013-08-07 DIAGNOSIS — E785 Hyperlipidemia, unspecified: Secondary | ICD-10-CM | POA: Diagnosis present

## 2013-08-07 DIAGNOSIS — K219 Gastro-esophageal reflux disease without esophagitis: Secondary | ICD-10-CM | POA: Diagnosis present

## 2013-08-07 DIAGNOSIS — Z951 Presence of aortocoronary bypass graft: Secondary | ICD-10-CM

## 2013-08-07 DIAGNOSIS — I2510 Atherosclerotic heart disease of native coronary artery without angina pectoris: Secondary | ICD-10-CM | POA: Diagnosis present

## 2013-08-07 DIAGNOSIS — I252 Old myocardial infarction: Secondary | ICD-10-CM

## 2013-08-07 DIAGNOSIS — J069 Acute upper respiratory infection, unspecified: Secondary | ICD-10-CM | POA: Diagnosis present

## 2013-08-07 DIAGNOSIS — Z79899 Other long term (current) drug therapy: Secondary | ICD-10-CM

## 2013-08-07 DIAGNOSIS — I10 Essential (primary) hypertension: Secondary | ICD-10-CM | POA: Diagnosis present

## 2013-08-07 DIAGNOSIS — J449 Chronic obstructive pulmonary disease, unspecified: Secondary | ICD-10-CM | POA: Diagnosis present

## 2013-08-07 DIAGNOSIS — I739 Peripheral vascular disease, unspecified: Secondary | ICD-10-CM | POA: Diagnosis present

## 2013-08-07 DIAGNOSIS — E876 Hypokalemia: Secondary | ICD-10-CM | POA: Diagnosis present

## 2013-08-07 DIAGNOSIS — Z825 Family history of asthma and other chronic lower respiratory diseases: Secondary | ICD-10-CM

## 2013-08-07 DIAGNOSIS — Z8249 Family history of ischemic heart disease and other diseases of the circulatory system: Secondary | ICD-10-CM

## 2013-08-07 DIAGNOSIS — J45901 Unspecified asthma with (acute) exacerbation: Principal | ICD-10-CM | POA: Diagnosis present

## 2013-08-07 DIAGNOSIS — J441 Chronic obstructive pulmonary disease with (acute) exacerbation: Principal | ICD-10-CM | POA: Diagnosis present

## 2013-08-07 DIAGNOSIS — Z87891 Personal history of nicotine dependence: Secondary | ICD-10-CM

## 2013-08-07 LAB — CBC
HCT: 35.6 % — ABNORMAL LOW (ref 36.0–46.0)
Hemoglobin: 11.8 g/dL — ABNORMAL LOW (ref 12.0–15.0)
RBC: 4.09 MIL/uL (ref 3.87–5.11)
WBC: 5.9 10*3/uL (ref 4.0–10.5)

## 2013-08-07 LAB — BASIC METABOLIC PANEL
BUN: 13 mg/dL (ref 6–23)
Chloride: 100 mEq/L (ref 96–112)
Glucose, Bld: 116 mg/dL — ABNORMAL HIGH (ref 70–99)
Potassium: 3.3 mEq/L — ABNORMAL LOW (ref 3.5–5.1)
Sodium: 139 mEq/L (ref 135–145)

## 2013-08-07 LAB — POCT I-STAT TROPONIN I: Troponin i, poc: 0 ng/mL (ref 0.00–0.08)

## 2013-08-07 LAB — TROPONIN I: Troponin I: <0.3 ng/mL (ref <0.30)

## 2013-08-07 MED ORDER — POTASSIUM CHLORIDE 20 MEQ/15ML (10%) PO LIQD
40.0000 meq | Freq: Once | ORAL | Status: AC
  Administered 2013-08-07: 40 meq via ORAL
  Filled 2013-08-07: qty 30

## 2013-08-07 MED ORDER — PANTOPRAZOLE SODIUM 40 MG PO TBEC
40.0000 mg | DELAYED_RELEASE_TABLET | Freq: Every day | ORAL | Status: DC
  Administered 2013-08-07 – 2013-08-09 (×3): 40 mg via ORAL
  Filled 2013-08-07 (×2): qty 1

## 2013-08-07 MED ORDER — ATORVASTATIN CALCIUM 20 MG PO TABS
20.0000 mg | ORAL_TABLET | Freq: Every day | ORAL | Status: DC
  Administered 2013-08-07 – 2013-08-08 (×2): 20 mg via ORAL
  Filled 2013-08-07 (×3): qty 1

## 2013-08-07 MED ORDER — PREDNISONE 50 MG PO TABS
60.0000 mg | ORAL_TABLET | Freq: Every day | ORAL | Status: DC
  Administered 2013-08-08 – 2013-08-09 (×2): 60 mg via ORAL
  Filled 2013-08-07 (×3): qty 1

## 2013-08-07 MED ORDER — LEVALBUTEROL HCL 0.63 MG/3ML IN NEBU
INHALATION_SOLUTION | RESPIRATORY_TRACT | Status: AC
  Filled 2013-08-07: qty 3

## 2013-08-07 MED ORDER — LEVALBUTEROL TARTRATE 45 MCG/ACT IN AERO: 2.0000 | INHALATION_SPRAY | RESPIRATORY_TRACT | Status: DC | PRN

## 2013-08-07 MED ORDER — AZITHROMYCIN 250 MG PO TABS
500.0000 mg | ORAL_TABLET | Freq: Once | ORAL | Status: AC
  Administered 2013-08-07: 500 mg via ORAL
  Filled 2013-08-07: qty 2

## 2013-08-07 MED ORDER — SODIUM CHLORIDE 0.9 % IJ SOLN
3.0000 mL | Freq: Two times a day (BID) | INTRAMUSCULAR | Status: DC
  Administered 2013-08-08 – 2013-08-09 (×2): 3 mL via INTRAVENOUS

## 2013-08-07 MED ORDER — SODIUM CHLORIDE 0.9 % IV SOLN
INTRAVENOUS | Status: DC
  Administered 2013-08-07: 21:00:00 via INTRAVENOUS

## 2013-08-07 MED ORDER — DOXYCYCLINE HYCLATE 100 MG PO TABS
100.0000 mg | ORAL_TABLET | Freq: Two times a day (BID) | ORAL | Status: DC
  Administered 2013-08-07 – 2013-08-09 (×4): 100 mg via ORAL
  Filled 2013-08-07 (×5): qty 1

## 2013-08-07 MED ORDER — AZITHROMYCIN 250 MG PO TABS
500.0000 mg | ORAL_TABLET | Freq: Once | ORAL | Status: DC
  Filled 2013-08-07: qty 2

## 2013-08-07 MED ORDER — LEVALBUTEROL HCL 0.63 MG/3ML IN NEBU
0.6300 mg | INHALATION_SOLUTION | Freq: Four times a day (QID) | RESPIRATORY_TRACT | Status: DC
  Administered 2013-08-07: 0.63 mg via RESPIRATORY_TRACT

## 2013-08-07 MED ORDER — ENOXAPARIN SODIUM 40 MG/0.4ML ~~LOC~~ SOLN
40.0000 mg | SUBCUTANEOUS | Status: DC
  Administered 2013-08-07 – 2013-08-08 (×2): 40 mg via SUBCUTANEOUS
  Filled 2013-08-07 (×3): qty 0.4

## 2013-08-07 MED ORDER — IPRATROPIUM BROMIDE 0.02 % IN SOLN
RESPIRATORY_TRACT | Status: AC
  Filled 2013-08-07: qty 2.5

## 2013-08-07 MED ORDER — PREDNISONE 20 MG PO TABS
60.0000 mg | ORAL_TABLET | Freq: Once | ORAL | Status: AC
  Administered 2013-08-07: 60 mg via ORAL
  Filled 2013-08-07: qty 3

## 2013-08-07 MED ORDER — ALBUTEROL (5 MG/ML) CONTINUOUS INHALATION SOLN
15.0000 mg/h | INHALATION_SOLUTION | RESPIRATORY_TRACT | Status: DC
  Administered 2013-08-07: 15 mg/h via RESPIRATORY_TRACT
  Filled 2013-08-07: qty 20

## 2013-08-07 MED ORDER — CLOPIDOGREL BISULFATE 75 MG PO TABS
75.0000 mg | ORAL_TABLET | Freq: Every day | ORAL | Status: DC
  Administered 2013-08-07 – 2013-08-09 (×3): 75 mg via ORAL
  Filled 2013-08-07 (×3): qty 1

## 2013-08-07 MED ORDER — IPRATROPIUM BROMIDE 0.02 % IN SOLN
0.5000 mg | Freq: Four times a day (QID) | RESPIRATORY_TRACT | Status: DC
  Administered 2013-08-07: 0.5 mg via RESPIRATORY_TRACT

## 2013-08-07 NOTE — H&P (Signed)
Date: 08/07/2013               Patient Name:  [name] MRN: [id number]  DOB: 04-09-1941 Age / Sex: 72 y.o., female   PCP: [name], MD              Medical Service: Internal Medicine Teaching Service              Attending Physician: Dr. Axel Filler, MD    First Contact: Dr. Loyal Jacobson Pager: [phone]  Second Contact: Dr. Alice Rieger  Pager: [phone]            After Hours (After 5p/  First Contact Pager: [phone]  weekends / holidays): Second Contact Pager: [phone]   Chief Complaint: increased shortness of breath, and cough for 5 days  History of Present Illness: 72 year old female with a history of hypertension, hyperlipidemia, COPD who presents emergency Department 5 days of shortness of breath worse with exertion and cough with white sputum production and wheezing. She denies history of fevers or chills. She reports chest pain that is increased with deep inhalation. No sore throat, earaches, post nasal drip, nasal congestion, or increased mucus production. Patient denies history of recent contact with anyone with URI, symptoms. Her appetite has been reduced since onset of symptoms. She also reports that she ran out of her albuterol both inhalers and nebulizer several, months ago, and her PCP was unable to refill them. She denies other symptoms of myalgias, arthralgias, diarrhea, or vomiting. No abdominal pain.  Review of Systems: Constitutional: Denies fever, chills, diaphoresis, appetite change and fatigue.  HEENT:denies ear pain, congestion, sore throat, rhinorrhea, sneezing, mouth sores, trouble swallowing, neck pain, neck stiffness and tinnitus.  Cardiovascular: Denies chest pain, palpitations and leg swelling. No orthopnea, no PND Gastrointestinal: Denies nausea, vomiting, abdominal pain, diarrhea, constipation,blood in stool and abdominal distention.  Genitourinary: Denies dysuria, urgency, frequency, hematuria, flank pain and difficulty urinating.    Musculoskeletal: Denies myalgias, back pain, joint swelling, arthralgias and gait problem.  Skin: Denies pallor, rash and wound.  Neurological: Denies dizziness, syncope, weakness, lightheadedness, numbness and headaches.  Hematological: Denies adenopathy. Easy bruising, personal or family bleeding history  Psychiatric/Behavioral: Denies suicidal ideation, mood changes, confusion, nervousness, sleep disturbance and agitation  Medications Prior to Admission  Medication Sig Dispense Refill  . acidophilus (RISAQUAD) CAPS capsule Take 2 capsules by mouth daily.  40 capsule  0  . albuterol (PROVENTIL) (2.5 MG/3ML) 0.083% nebulizer solution Take 2.5 mg by nebulization every 4 (four) hours as needed for wheezing or shortness of breath (((PLAN C))).       . albuterol (VENTOLIN HFA) 108 (90 BASE) MCG/ACT inhaler Inhale 2 puffs into the lungs every 4 (four) hours as needed for wheezing or shortness of breath (((PLAN B))).       Marland Kitchen amLODipine-olmesartan (AZOR) 10-40 MG per tablet Take 1 tablet by mouth daily.      . budesonide (PULMICORT) 0.25 MG/2ML nebulizer solution One twice daily with perforomist  120 mL  12  . cetirizine (ZYRTEC) 1 MG/ML syrup Take 5 mg by mouth as needed (allergies).      . clopidogrel (PLAVIX) 75 MG tablet Take 1 tablet (75 mg total) by mouth daily.  90 tablet  3  . dexlansoprazole (DEXILANT) 60 MG capsule Take 1 capsule (60 mg total) by mouth daily before breakfast.  30 capsule  11  . formoterol (PERFOROMIST) 20 MCG/2ML nebulizer solution Take 20 mcg by nebulization daily. Half dose per day.      Marland Kitchen  nitroGLYCERIN (NITROSTAT) 0.4 MG SL tablet Place 0.4 mg under the tongue continuous as needed for chest pain.       . rosuvastatin (CRESTOR) 10 MG tablet Take 10 mg by mouth at bedtime.         Allergies: Allergies as of 08/07/2013 - Review Complete 08/07/2013  Allergen Reaction Noted  . Codeine Other (See Comments) 11/27/2011  . Oxycodone-acetaminophen Other (See Comments)  11/27/2011  . Avelox [moxifloxacin hcl in nacl] Palpitations 11/27/2011   Past Medical History  Diagnosis Date  . Asthma   . Reflux   . Hiatal hernia   . Peripheral vascular disease   . Hypertension   . Hyperlipidemia   . Iliac artery aneurysm   . Aortoiliac occlusive disease   . 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 Carotid artery occlusion   . Myocardial infarction 01/01/2000    Cardiac catheterization  . COPD (chronic obstructive pulmonary disease)   . Coronary artery disease   . Arnold-Chiari malformation 1998  . 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  . Gastroesophageal reflux disease    Past Surgical History  Procedure Laterality Date  . Eye surgery      Laser surgery for retinal hemorrhage  . Rotator cuff repair      Right  . Coronary artery bypass graft  01/01/2000  . Post coronary artery  bpg  01/05/2000    Right jugular sheath removed  . Carotid endarterectomy  03/29/2010    Left  CEA  . Pr vein bypass graft,aorto-fem-pop    . Arnold-chiari malformation repair  1998    Suboccipital craniectomy  . Corneal transplant      Right  . Appendectomy    . Cholecystectomy    . Abdominal hysterectomy     Family History  Problem Relation Age of Onset  . Heart disease Mother     Heart Disease before age 35  . Hypertension Mother   . Hyperlipidemia Mother   . Heart attack Mother   . Clotting disorder Mother   . Heart disease Father     Heart Disease before age 71  . Heart attack Father   . Heart disease Brother     Heart Disease before age 21  . Hyperlipidemia Brother   . Hypertension Brother   . Clotting disorder Brother   . Cerebral aneurysm Sister   . Asthma Sister   . Cerebral aneurysm Brother   . Cancer Brother   . Heart disease Brother   . Heart disease Brother   . Heart disease Brother    History   Social History  . Marital Status: Widowed    Spouse Name: N/A    Number of Children: 4  . Years of Education: 7TH   Occupational History    . Not on file.   Social History Main Topics  . Smoking status: Former Smoker -- 1.00 packs/day for 30 years    Types: Cigarettes    Quit date: 08/13/2000  . Smokeless tobacco: Never Used  . Alcohol Use: No  . Drug Use: No  . Sexual Activity: Not on file   Other Topics Concern  . Not on file   Social History Narrative  . No narrative on file    Physical Exam: Filed Vitals:   08/07/13 1946  BP: 119/35  Pulse: 77  Temp:   Resp:   General: Appears to be in respiratory distress,  well nourished;cooperative with exam, family at bedside Eye: pupils equal, round and reactive; sclera anicteric; normal  conjunctiva  Nose/throat: oropharynx clear, moist mucous membranes, pink gums  Neck: supple Lungs/Chest wall: Bilateral wheezing to auscultation bilaterally.  Heart: normal rate and regular rhythm; no murmurs Pulses: radial and dorsalis pedis pulses are 2+ and symmetric  Abdomen: Normal fullness, no rebound, guarding, or rigidity; normal bowel sounds.  Skin: warm, dry, intact, normal turgor, no rashes  Extremities: no peripheral edema, clubbing, or cyanosis Neurologic: A&O X3, CN II - XII are grossly intact. Motor strength is 5/5 in the all 4 extremities  Lab results: Basic Metabolic Panel:  Recent Labs  08/07/13 1324  NA 139  K 3.3*  CL 100  CO2 28  GLUCOSE 116*  BUN 13  CREATININE 1.04  CALCIUM 9.1   CBC:  Recent Labs  08/07/13 1324  WBC 5.9  HGB 11.8*  HCT 35.6*  MCV 87.0  PLT 227   BNP:  Recent Labs  08/07/13 1324  PROBNP 130.7*    Imaging results:  Dg Chest 2 View  08/07/2013   CLINICAL DATA:  Shortness of breath and cough  EXAM: CHEST  2 VIEW  COMPARISON:  04/01/2013  FINDINGS: The cardiac shadow is stable. Postoperative changes are again seen. The lungs are clear bilaterally. No acute bony abnormality is seen.  IMPRESSION: No active cardiopulmonary disease.   Electronically Signed   By: [name] M.D.   On: 08/07/2013 12:39    Other  results: EKG: normal sinus rhythm, nonspecific ST and T waves changes, PVCs noted on monitor.  Assessment & Plan by Problem: Principal Problem:   COPD with acute exacerbation Active Problems:   COPD GOLD II   CAD (coronary artery disease)   # COPED exacerbation: Differential diagnoses include COPD exacerbation, versus pneumonia. Viral bronchitis is also considered. Patient has been off cigarettes for several years. She reports having ran out of her albuterol for several months. She is compliant with her Formoterol and Budesonide nebs as prescribed by Dr. Melvyn Novas.  Noted low diastolic blood pressures in the ranges of 35 to 50s. No leukocytosis. 2 view x-rays without acute cardiopulmonary process. This makes a PNA unlikely at this time.  Plan  - admit to tele under observation  - start duonebs q6hrs scheduled - use Xopenex due to tachycardia  - doxycycline 100 mg bid for 7 days - prednisone oral 40 mg  - if concern of pneumonia remains, we can repeat cxr - IVF 75 cc/hr for rehydration  - check flu panel   # CAD: Denies active chest pain. EKG does not reveal ischemic changes. Troponin is negative. Trend troponin give her cardiac history.   # Hypokalemia: Monitor with BMETs and replete as needed.   Dispo: Disposition is deferred at this time, awaiting improvement of current medical problems. Anticipated discharge in approximately 1-2 day(s).   The patient does have a current PCP [name], MD), therefore is not require OPC follow-up after discharge.   The patient does not have transportation limitations that hinder transportation to clinic appointments.   Signed:  Jessee Avers, MD PGY-2 Internal Medicine Teaching Service Pager: [phone] (7pm-7am) 08/07/2013, 7:20 PM

## 2013-08-07 NOTE — ED Provider Notes (Signed)
TIME SEEN: 2:07 PM  CHIEF COMPLAINT: Shortness of breath  HPI: Patient is a 72 year old female with a history of hypertension, hyperlipidemia, COPD who presents emergency Department 5 days of shortness of breath worse with exertion and cough with white sputum production and wheezing. She states that multiple family hours have a viral URI. She states she has had some chest pain when she coughs and with deep inspiration. No lower extremity swelling or pain. No recent history of prolonged immobilization such as long flight or hospitalization, recent fracture or surgery, trauma, exogenous estrogen use, history of cancer. No prior history of PE or DVT. Patient denies any fevers or chills. States this feels similar to her prior COPD exacerbations.  ROS: See HPI Constitutional: no fever  Eyes: no drainage  ENT: no runny nose   Cardiovascular:   chest pain  Resp: SOB  GI: no vomiting GU: no dysuria Integumentary: no rash  Allergy: no hives  Musculoskeletal: no leg swelling  Neurological: no slurred speech ROS otherwise negative  PAST MEDICAL HISTORY/PAST SURGICAL HISTORY:  Past Medical History  Diagnosis Date  . Asthma   . Reflux   . Hiatal hernia   . Peripheral vascular disease   . Hypertension   . Hyperlipidemia   . Iliac artery aneurysm   . Aortoiliac occlusive disease   . 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 Carotid artery occlusion   . Myocardial infarction 01/01/2000    Cardiac catheterization  . COPD (chronic obstructive pulmonary disease)   . Coronary artery disease   . Arnold-Chiari malformation 1998  . 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  . Gastroesophageal reflux disease     MEDICATIONS:  Prior to Admission medications   Medication Sig Start Date End Date Taking? Authorizing Provider  acidophilus (RISAQUAD) CAPS capsule Take 2 capsules by mouth daily. 04/06/13   Donne Hazel, MD  albuterol (PROVENTIL) (2.5 MG/3ML) 0.083% nebulizer solution Take 2.5 mg by  nebulization every 4 (four) hours as needed for wheezing or shortness of breath (((PLAN C))).     Historical Provider, MD  albuterol (VENTOLIN HFA) 108 (90 BASE) MCG/ACT inhaler Inhale 2 puffs into the lungs every 4 (four) hours as needed for wheezing or shortness of breath (((PLAN B))).     Historical Provider, MD  amLODipine-olmesartan (AZOR) 10-40 MG per tablet Take 1 tablet by mouth daily.    Historical Provider, MD  budesonide (PULMICORT) 0.25 MG/2ML nebulizer solution One twice daily with perforomist 11/17/12   Tanda Rockers, MD  cetirizine (ZYRTEC) 1 MG/ML syrup Take 5 mg by mouth as needed (allergies).    Historical Provider, MD  clopidogrel (PLAVIX) 75 MG tablet Take 1 tablet (75 mg total) by mouth daily. 04/02/12   Conrad Bolivia, MD  dexlansoprazole (DEXILANT) 60 MG capsule Take 1 capsule (60 mg total) by mouth daily before breakfast. 11/17/12   Tanda Rockers, MD  formoterol (PERFOROMIST) 20 MCG/2ML nebulizer solution Take 20 mcg by nebulization daily. Half dose per day.    Historical Provider, MD  gabapentin (NEURONTIN) 100 MG capsule Take 1 capsule (100 mg total) by mouth 3 (three) times daily. 05/29/13   [name], MD  gabapentin (NEURONTIN) 300 MG capsule One capsule twice a day for 2 weeks, then take one capsule three times a day 05/28/13   [name], MD  nitroGLYCERIN (NITROSTAT) 0.4 MG SL tablet Place 0.4 mg under the tongue continuous as needed for chest pain.     Historical Provider, MD  rosuvastatin (  CRESTOR) 10 MG tablet Take 10 mg by mouth at bedtime.     Historical Provider, MD    ALLERGIES:  Allergies  Allergen Reactions  . Codeine Other (See Comments)    Dr. Terrence Dupont advised patient not to take this medication  . Oxycodone-Acetaminophen Other (See Comments)    Says it makes her feel weird  . Avelox [Moxifloxacin Hcl In Nacl] Palpitations    SOCIAL HISTORY:  History  Substance Use Topics  . Smoking status: Former Smoker -- 1.00 packs/day for 30 years     Types: Cigarettes    Quit date: 08/13/2000  . Smokeless tobacco: Never Used  . Alcohol Use: No    FAMILY HISTORY: Family History  Problem Relation Age of Onset  . Heart disease Mother     Heart Disease before age 36  . Hypertension Mother   . Hyperlipidemia Mother   . Heart attack Mother   . Clotting disorder Mother   . Heart disease Father     Heart Disease before age 67  . Heart attack Father   . Heart disease Brother     Heart Disease before age 28  . Hyperlipidemia Brother   . Hypertension Brother   . Clotting disorder Brother   . Cerebral aneurysm Sister   . Asthma Sister   . Cerebral aneurysm Brother   . Cancer Brother   . Heart disease Brother   . Heart disease Brother   . Heart disease Brother     EXAM: BP 128/51  Pulse 70  Temp(Src) 97.9 F (36.6 C) (Oral)  Resp 19  SpO2 95% CONSTITUTIONAL: Alert and oriented and responds appropriately to questions. Well-appearing; well-nourished HEAD: Normocephalic EYES: Conjunctivae clear, PERRL ENT: normal nose; no rhinorrhea; moist mucous membranes; pharynx without lesions noted NECK: Supple, no meningismus, no LAD  CARD: RRR; S1 and S2 appreciated; no murmurs, no clicks, no rubs, no gallops RESP: Normal chest excursion without splinting or tachypnea; breath sounds equal bilaterally with coarse breath sounds diffusely and expiratory wheezing, no rales ABD/GI: Normal bowel sounds; non-distended; soft, non-tender, no rebound, no guarding BACK:  The back appears normal and is non-tender to palpation, there is no CVA tenderness EXT: Normal ROM in all joints; non-tender to palpation; no edema; normal capillary refill; no cyanosis    SKIN: Normal color for age and race; warm NEURO: Moves all extremities equally PSYCH: The patient's mood and manner are appropriate. Grooming and personal hygiene are appropriate.  MEDICAL DECISION MAKING: Patient here with likely COPD exacerbation. She is not hypoxic and in no respiratory  distress at rest. She has coarse breath sounds bilaterally with wheezing. Suspect viral illness as the cause of her symptoms. No risk factors for PE other than age.  Chest x-ray clear. Troponin negative. Labs otherwise are unremarkable other than mild hypokalemia, will replace. We'll give continuous albuterol, azithromycin and prednisone. Will reassess for disposition. Her primary care physician is Dr. Coletta Memos with [facility].  ED PROGRESS: Pt still wheezing, SOB when up to bathroom.  States she gets admitted every time for COPD exac.  Will d/w internal medicine for admission.  3:12 PM  D/w internal medicine resident for admission for COPD exacerbation.   EKG Interpretation    Date/Time:  Friday August 07 2013 11:58:09 EST Ventricular Rate:  72 PR Interval:  140 QRS Duration: 88 QT Interval:  432 QTC Calculation: 473 R Axis:   79 Text Interpretation:  Normal sinus rhythm Nonspecific T wave abnormality Prolonged QT Abnormal ECG  Confirmed by WARD  DO, KRISTEN [phone]) on 08/07/2013 2:07:31 PM             [place], DO 08/07/13 1513

## 2013-08-07 NOTE — ED Notes (Signed)
Per pt sts that she has been having heaviness in her chest with cough and white sputum.

## 2013-08-08 LAB — INFLUENZA PANEL BY PCR (TYPE A & B)
H1N1 flu by pcr: NOT DETECTED
Influenza A By PCR: NEGATIVE
Influenza B By PCR: NEGATIVE

## 2013-08-08 LAB — APTT: aPTT: 34 seconds (ref 24–37)

## 2013-08-08 LAB — COMPREHENSIVE METABOLIC PANEL
ALT: 13 U/L (ref 0–35)
Albumin: 3.2 g/dL — ABNORMAL LOW (ref 3.5–5.2)
Alkaline Phosphatase: 62 U/L (ref 39–117)
BUN: 17 mg/dL (ref 6–23)
Potassium: 4.6 mEq/L (ref 3.5–5.1)
Sodium: 138 mEq/L (ref 135–145)
Total Protein: 6.9 g/dL (ref 6.0–8.3)

## 2013-08-08 LAB — TROPONIN I: Troponin I: <0.3 ng/mL (ref <0.30)

## 2013-08-08 LAB — PROTIME-INR: Prothrombin Time: 13.8 seconds (ref 11.6–15.2)

## 2013-08-08 MED ORDER — ACETAMINOPHEN 325 MG PO TABS
650.0000 mg | ORAL_TABLET | Freq: Four times a day (QID) | ORAL | Status: DC | PRN
  Administered 2013-08-08: 650 mg via ORAL
  Filled 2013-08-08: qty 2

## 2013-08-08 MED ORDER — DM-GUAIFENESIN ER 30-600 MG PO TB12: 1.0000 | ORAL_TABLET | Freq: Two times a day (BID) | ORAL | Status: DC | PRN

## 2013-08-08 MED ORDER — IPRATROPIUM BROMIDE 0.02 % IN SOLN
0.5000 mg | Freq: Three times a day (TID) | RESPIRATORY_TRACT | Status: DC
  Administered 2013-08-08 – 2013-08-09 (×5): 0.5 mg via RESPIRATORY_TRACT
  Filled 2013-08-08 (×5): qty 2.5

## 2013-08-08 MED ORDER — LEVALBUTEROL HCL 0.63 MG/3ML IN NEBU
0.6300 mg | INHALATION_SOLUTION | Freq: Three times a day (TID) | RESPIRATORY_TRACT | Status: DC
  Administered 2013-08-08 – 2013-08-09 (×5): 0.63 mg via RESPIRATORY_TRACT
  Filled 2013-08-08 (×11): qty 3

## 2013-08-08 MED ORDER — DM-GUAIFENESIN ER 30-600 MG PO TB12
1.0000 | ORAL_TABLET | Freq: Two times a day (BID) | ORAL | Status: DC | PRN
  Administered 2013-08-09: 1 via ORAL
  Filled 2013-08-08 (×2): qty 1

## 2013-08-08 NOTE — Progress Notes (Signed)
Subjective:    Interval Events:  Patient is feeling better today. No acute overnight events.  No fevers. She reports that she is still short of breath somehow off her baseline    Objective:     Last BM Date: 08/07/13   Weights: 24-hour Weight change:   Filed Weights   08/07/13 1944 08/08/13 0554  Weight: 168 lb 14.4 oz (76.613 kg) 169 lb 8 oz (76.885 kg)     Intake/Output:   Intake/Output Summary (Last 24 hours) at 08/08/13 1206 Last data filed at 08/08/13 0824  Gross per 24 hour  Intake 1591.25 ml  Output      0 ml  Net 1591.25 ml       Physical Exam:  Vital Signs:   Temp:  [97.9 F (36.6 C)-98 F (36.7 C)] 97.9 F (36.6 C) (12/27 0554) Pulse Rate:  [62-77] 62 (12/27 0554) Resp:  [18-26] 18 (12/27 0554) BP: (105-131)/(33-51) 126/40 mmHg (12/27 0554) SpO2:  [94 %-100 %] 96 % (12/27 0742) FiO2 (%):  [21 %] 21 % (12/26 2019) Weight:  [168 lb 14.4 oz (76.613 kg)-169 lb 8 oz (76.885 kg)] 169 lb 8 oz (76.885 kg) (12/27 0554)  General: well developed, well nourished, no acute distress  Lungs/Chest wall: mild-moderate wheezing bilaterally, normal work of breathing. On room air  Heart: normal rate, RRR; no murmurs. No JVD Pulses: peripheral pulse are 2+ and symmetric  Abdomen: Normal fullness, nontender to palpation,  no masses or organomegaly  Skin: warm, dry, intact, no rashes  Extremities: no peripheral edema, warm, peripheral pulses are present. Normal power.  Neurologic: A&O X3, CN II - XII are grossly intact.   Labs: Basic Metabolic Panel:  Recent Labs Lab 08/07/13 1324 08/08/13 0600  NA 139 138  K 3.3* 4.6  CL 100 105  CO2 28 24  GLUCOSE 116* 122*  BUN 13 17  CREATININE 1.04 0.89  CALCIUM 9.1 8.9  MG 2.1  --     Liver Function Tests:  Recent Labs Lab 08/08/13 0600  AST 18  ALT 13  ALKPHOS 62  BILITOT 0.2*  PROT 6.9  ALBUMIN 3.2*     CBC:  Recent Labs Lab 08/07/13 1324  WBC 5.9  HGB 11.8*  HCT 35.6*  MCV 87.0  PLT  227    Cardiac Enzymes:  Recent Labs Lab 08/07/13 2230 08/08/13 0600 08/08/13 0840  TROPONINI <0.30 <0.30 <0.30    CBG: No results found for this basename: GLUCAP,  in the last 168 hours  Coagulation Studies:  Recent Labs  08/08/13 0600  LABPROT 13.8  INR 1.08    Microbiology: Results for orders placed during the hospital encounter of 04/01/13  CLOSTRIDIUM DIFFICILE BY PCR     Status: None   Collection Time    04/01/13  1:53 PM      Result Value Range Status   C difficile by pcr NEGATIVE  NEGATIVE Final  STOOL CULTURE     Status: None   Collection Time    04/01/13  1:53 PM      Result Value Range Status   Specimen Description STOOL   Final   Special Requests NONE   Final   Culture     Final   Value: SALMONELLA SPECIES     Note: SALMONELLA TYPHIMURIUM CRITICAL RESULT CALLED TO, READ BACK BY AND VERIFIED WITH: VALENCIA AT 555PM 04/05/13 [place] FAXED TO [place] [id number] BY [facility] IN [place], [place]  Performed at [facility]   Report Status 05/07/2013 FINAL   Final   Organism ID, Bacteria SALMONELLA SPECIES   Final  MRSA PCR SCREENING     Status: None   Collection Time    04/01/13  2:33 PM      Result Value Range Status   MRSA by PCR NEGATIVE  NEGATIVE Final   Comment:            The GeneXpert MRSA Assay (FDA     approved for NASAL specimens     only), is one component of a     comprehensive MRSA colonization     surveillance program. It is not     intended to diagnose MRSA     infection nor to guide or     monitor treatment for     MRSA infections.     Imaging: Dg Chest 2 View  08/07/2013   CLINICAL DATA:  Shortness of breath and cough  EXAM: CHEST  2 VIEW  COMPARISON:  04/01/2013  FINDINGS: The cardiac shadow is stable. Postoperative changes are again seen. The lungs are clear bilaterally. No acute bony abnormality is seen.  IMPRESSION: No active cardiopulmonary disease.   Electronically Signed    By: [name] M.D.   On: 08/07/2013 12:39     Medications:    Infusions: . sodium chloride 75 mL/hr at 08/07/13 2035  . albuterol Stopped (08/07/13 1630)     Scheduled Medications: . atorvastatin  20 mg Oral q1800  . clopidogrel  75 mg Oral Daily  . doxycycline  100 mg Oral Q12H  . enoxaparin (LOVENOX) injection  40 mg Subcutaneous Q24H  . ipratropium  0.5 mg Nebulization TID  . levalbuterol  0.63 mg Nebulization TID  . pantoprazole  40 mg Oral Daily  . predniSONE  60 mg Oral Q breakfast  . sodium chloride  3 mL Intravenous Q12H    PRN Medications: acetaminophen, dextromethorphan-guaiFENesin, levalbuterol   Assessment/ Plan:    73 year old female with a history of hypertension, hyperlipidemia, COPD who presents emergency Department 5 days of shortness of breath worse with exertion and cough with white sputum production and wheezing.  # COPED exacerbation: Trigger most likely viral URI. No pneumonia on 2 view CXR. Patient has been off cigarettes for several years. She reports having ran out of her albuterol for several months. She is compliant with her Formoterol and Budesonide nebs as prescribed by Dr. Melvyn Novas.  She has improved since yesterday. O2 sats remained >94% on ambulation in hallway today.   Plan  - d/c tele today - cont with duonebs q6hrs scheduled  - cont with doxycycline 100 mg bid. This is day #1/7. End date 08/13/12 - cont with prednisone oral 40 mg. End date 12/30 - flu pcr - negative. D/c droplet precautions - IVF 75 cc/hr for rehydration  - get PT/OT see her today.   #CAD: Denies active chest pain. EKG does not reveal ischemic changes. Troponin is negative.  Trend troponin give her cardiac history.   # Hypokalemia: Resolved. Monitor with BMETs and replete as needed.    Dispo: Disposition is deferred at this time, awaiting improvement of current medical problems. Will likely be discharged tomorrow.   The patient does have a current PCP [name],  MD), therefore is not require OPC follow-up after discharge.  The patient does not have transportation limitations that hinder transportation to clinic appointments.    Length of Stay: 1 days   Signed by:  Jessee Avers, MD PGY-2, Internal Medicine Pager [phone] 08/08/2013, 12:06 PM

## 2013-08-08 NOTE — Progress Notes (Signed)
Had just stepped out of room 4e05 @ 2150 where I was administering his HS meds. Saw a new order for Flu PCR written @ 2112 for pt in 4e01. Pt & daughter were informed of order and Tech Romie Minus) was also asked to perform set up for droplet precaution.

## 2013-08-08 NOTE — H&P (Signed)
Internal Medicine Attending Admission Note Date: 08/08/2013  Patient name: [name] Medical record number: [id number] Date of birth: September 05, 1940 Age: 72 y.o. Gender: female  I saw and evaluated the patient. I reviewed the resident's note and I agree with the resident's findings and plan as documented in the resident's note, with the following additional comments.  Chief Complaint(s): Cough, shortness of breath  History - key components related to admission: Patient is a 72 year old woman with history of COPD, hypertension, hyperlipidemia, and other problems as outlined in the medical history who reports a 5 day history of cough productive of white sputum, wheezing, and progressively worsening shortness of breath.  She has been out of all of her albuterol inhaler and nebulizers solution, but otherwise reports that she has been compliant with her inhaled medications.  She has some chest soreness whenever she coughs, but otherwise denies chest pain; she also denies fever, chills, or sore throat. Physical Exam - key components related to admission:  Filed Vitals:   08/07/13 2028 08/08/13 0134 08/08/13 0554 08/08/13 0742  BP:  122/33 126/40   Pulse:  67 62   Temp:  98 F (36.7 C) 97.9 F (36.6 C)   TempSrc:  Oral Oral   Resp:  18 18   Height:      Weight:   169 lb 8 oz (76.885 kg)   SpO2: 97% 95% 94% 96%    General: Alert, oriented, no acute distress Lungs: Distant breath sounds, no wheezing at the time of my exam (following treatment) Heart: Regular; no extra sounds or murmurs Abdomen: Bowel sounds present, soft, nontender Extremities: No edema  Lab results:   Basic Metabolic Panel:  Recent Labs  08/07/13 1324 08/08/13 0600  NA 139 138  K 3.3* 4.6  CL 100 105  CO2 28 24  GLUCOSE 116* 122*  BUN 13 17  CREATININE 1.04 0.89  CALCIUM 9.1 8.9  MG 2.1  --     Liver Function Tests:  Recent Labs  08/08/13 0600  AST 18  ALT 13  ALKPHOS 62  BILITOT 0.2*  PROT 6.9   ALBUMIN 3.2*    CBC:  Recent Labs  08/07/13 1324  WBC 5.9  HGB 11.8*  HCT 35.6*  MCV 87.0  PLT 227     Cardiac Enzymes:  Recent Labs  08/07/13 2230 08/08/13 0600 08/08/13 0840  TROPONINI <0.30 <0.30 <0.30    BNP:  Recent Labs  08/07/13 1324  PROBNP 130.7*     Coagulation:  Recent Labs  08/08/13 0600  INR 1.08      Urinalysis    Component Value Date/Time   COLORURINE YELLOW 04/02/2013 0232   APPEARANCEUR CLOUDY* 04/02/2013 0232   LABSPEC 1.017 04/02/2013 0232   PHURINE 5.5 04/02/2013 0232   GLUCOSEU NEGATIVE 04/02/2013 0232   HGBUR NEGATIVE 04/02/2013 0232   BILIRUBINUR NEGATIVE 04/02/2013 0232   KETONESUR NEGATIVE 04/02/2013 0232   PROTEINUR NEGATIVE 04/02/2013 0232   UROBILINOGEN 0.2 04/02/2013 0232   NITRITE NEGATIVE 04/02/2013 0232   LEUKOCYTESUR NEGATIVE 04/02/2013 0232     Imaging results:  Dg Chest 2 View  08/07/2013   CLINICAL DATA:  Shortness of breath and cough  EXAM: CHEST  2 VIEW  COMPARISON:  04/01/2013  FINDINGS: The cardiac shadow is stable. Postoperative changes are again seen. The lungs are clear bilaterally. No acute bony abnormality is seen.  IMPRESSION: No active cardiopulmonary disease.   Electronically Signed   By: [name] M.D.   On: 08/07/2013 12:39  Other results: EKG: Normal sinus rhythm; nonspecific T wave abnormality  Assessment & Plan by Problem:  1.  COPD exacerbation.  Patient symptoms were possibly precipitated by viral upper respiratory infection.  She has also been out of her albuterol inhaler and nebulizer solution.  The plan is steroids; inhaled bronchodilators; empiric doxycycline; monitor; follow oxygen saturations and supplement if indicated; check influenza PCR.  2.  Other problems and plans as per the resident physician's note.  3.  Attending physician.  Dr. Lynnae January will take over as attending physician tomorrow 08/09/2013.

## 2013-08-08 NOTE — Progress Notes (Signed)
Pt ambulated in hallway 648ft sats 94% on RA during ambulation at rest 95% RA.

## 2013-08-09 DIAGNOSIS — I10 Essential (primary) hypertension: Secondary | ICD-10-CM

## 2013-08-09 MED ORDER — AMLODIPINE BESYLATE 10 MG PO TABS
10.0000 mg | ORAL_TABLET | Freq: Every day | ORAL | Status: DC
  Administered 2013-08-09: 10 mg via ORAL
  Filled 2013-08-09: qty 1

## 2013-08-09 MED ORDER — ALBUTEROL SULFATE HFA 108 (90 BASE) MCG/ACT IN AERS: 2.0000 | INHALATION_SPRAY | RESPIRATORY_TRACT | Status: DC | PRN

## 2013-08-09 MED ORDER — MOMETASONE FURO-FORMOTEROL FUM 100-5 MCG/ACT IN AERO
2.0000 | INHALATION_SPRAY | Freq: Two times a day (BID) | RESPIRATORY_TRACT | Status: DC
  Filled 2013-08-09: qty 8.8

## 2013-08-09 MED ORDER — PREDNISONE 20 MG PO TABS: ORAL_TABLET | ORAL | Status: DC

## 2013-08-09 MED ORDER — AMLODIPINE-OLMESARTAN 10-40 MG PO TABS: 1.0000 | ORAL_TABLET | Freq: Every day | ORAL | Status: DC

## 2013-08-09 MED ORDER — DOXYCYCLINE HYCLATE 100 MG PO TABS: 100.0000 mg | ORAL_TABLET | Freq: Two times a day (BID) | ORAL | Status: AC

## 2013-08-09 MED ORDER — IRBESARTAN 300 MG PO TABS
300.0000 mg | ORAL_TABLET | Freq: Every day | ORAL | Status: DC
  Administered 2013-08-09: 300 mg via ORAL
  Filled 2013-08-09: qty 1

## 2013-08-09 NOTE — Progress Notes (Signed)
All d/c instructions explained and given to pt.  Verbalized understanding.  ellie BuckRN

## 2013-08-09 NOTE — Discharge Summary (Signed)
Name: [name] MRN: [id number] DOB: Mar 18, 1941 72 y.o. PCP: [name], MD  Date of Admission: 08/07/2013  2:04 PM Date of Discharge: 08/09/2013 Attending Physician: Larey Dresser, MD  Discharge Diagnosis: Principal Problem:   COPD with acute exacerbation Active Problems:   COPD GOLD II   CAD (coronary artery disease)   Hypertension   Hypokalemia  Discharge Medications:   Medication List         acidophilus Caps capsule  Take 2 capsules by mouth daily.     albuterol 108 (90 BASE) MCG/ACT inhaler  Commonly known as:  VENTOLIN HFA  Inhale 2 puffs into the lungs every 4 (four) hours as needed for wheezing or shortness of breath (((PLAN B))).     albuterol (2.5 MG/3ML) 0.083% nebulizer solution  Commonly known as:  PROVENTIL  Take 2.5 mg by nebulization every 4 (four) hours as needed for wheezing or shortness of breath (((PLAN C))).     amLODipine-olmesartan 10-40 MG per tablet  Commonly known as:  AZOR  Take 1 tablet by mouth daily.     budesonide 0.25 MG/2ML nebulizer solution  Commonly known as:  PULMICORT  One twice daily with perforomist     cetirizine 1 MG/ML syrup  Commonly known as:  ZYRTEC  Take 5 mg by mouth as needed (allergies).     clopidogrel 75 MG tablet  Commonly known as:  PLAVIX  Take 1 tablet (75 mg total) by mouth daily.     dexlansoprazole 60 MG capsule  Commonly known as:  DEXILANT  Take 1 capsule (60 mg total) by mouth daily before breakfast.     doxycycline 100 MG tablet  Commonly known as:  VIBRA-TABS  Take 1 tablet (100 mg total) by mouth every 12 (twelve) hours.     nitroGLYCERIN 0.4 MG SL tablet  Commonly known as:  NITROSTAT  Place 0.4 mg under the tongue continuous as needed for chest pain.     PERFOROMIST 20 MCG/2ML nebulizer solution  Generic drug:  formoterol  Take 20 mcg by nebulization daily. Half dose per day.     predniSONE 20 MG tablet  Commonly known as:  DELTASONE  Take 3 tabs (60mg ) with breakfast  tomorrow and Tues, 2 tabs (40mg ) w/ breakfast Weds and Thurs, and 1 tab (20 mg) w/ breakfast Fri and Sat.     rosuvastatin 10 MG tablet  Commonly known as:  CRESTOR  Take 10 mg by mouth at bedtime.        Disposition and follow-up:   [name] was discharged from [facility] in Stable condition.  At the hospital follow up visit please address:  1.  Respiratory status? Confirm she has access to Albuterol MDI? Harwani follow up with outpatient cardiac rehab?  2.  Labs / imaging needed at time of follow-up: None  3.  Pending labs/ test needing follow-up: None  Follow-up Appointments: Follow-up Information   Follow up with BOUSKA,DAVID E, MD. Schedule an appointment as soon as possible for a visit in 1 week. (Please follow up as soon as possible with your PCP.)    Specialty:  Family Medicine   Contact information:   [address] [place] [address] [phone]       Follow up with Clent Demark, MD. Schedule an appointment as soon as possible for a visit in 1 week. (This is your cardiologist.)    Specialty:  Cardiology   Contact information:   [address]  [place] [address] [phone]       Follow up with [name]. (for home health physical therapy and nurse)    Contact information:   [address] [place] [address] [phone]       Discharge Instructions: Discharge Orders   Future Appointments Provider Department Dept Phone   10/16/2013 11:00 AM [name], MD [facility] [phone]   01/06/2014 3:00 PM [name] [phone]   01/06/2014 4:00 PM [name], MD [facility] [phone]   Future Orders Complete By Expires   Diet - low sodium heart healthy  As directed    Increase activity slowly  As directed       Consultations:  None  Procedures Performed:  Dg  Chest 2 View  08/07/2013   CLINICAL DATA:  Shortness of breath and cough  EXAM: CHEST  2 VIEW  COMPARISON:  04/01/2013  FINDINGS: The cardiac shadow is stable. Postoperative changes are again seen. The lungs are clear bilaterally. No acute bony abnormality is seen.  IMPRESSION: No active cardiopulmonary disease.   Electronically Signed   By: [name] M.D.   On: 08/07/2013 12:39    Admission HPI:  72 year old female with a history of hypertension, hyperlipidemia, COPD who presents emergency Department 5 days of shortness of breath worse with exertion and cough with white sputum production and wheezing. She denies history of fevers or chills. She reports chest pain that is increased with deep inhalation. No sore throat, earaches, post nasal drip, nasal congestion, or increased mucus production. Patient denies history of recent contact with anyone with URI, symptoms. Her appetite has been reduced since onset of symptoms. She also reports that she ran out of her albuterol both inhalers and nebulizer several, months ago, and her PCP was unable to refill them. She denies other symptoms of myalgias, arthralgias, diarrhea, or vomiting. No abdominal pain.  Physical Exam:  Filed Vitals:    08/07/13 1946   BP:  119/35   Pulse:  77   Temp:    Resp:    General: Appears to be in respiratory distress, well nourished;cooperative with exam, family at bedside  Eye: pupils equal, round and reactive; sclera anicteric; normal conjunctiva  Nose/throat: oropharynx clear, moist mucous membranes, pink gums  Neck: supple  Lungs/Chest wall: Bilateral wheezing to auscultation bilaterally.  Heart: normal rate and regular rhythm; no murmurs  Pulses: radial and dorsalis pedis pulses are 2+ and symmetric  Abdomen: Normal fullness, no rebound, guarding, or rigidity; normal bowel sounds.  Skin: warm, dry, intact, normal turgor, no rashes  Extremities: no peripheral edema, clubbing, or cyanosis  Neurologic: A&O X3, CN II  - XII are grossly intact. Motor strength is 5/5 in the all 4 extremities   Hospital Course by problem list:  1. COPD with acute exacerbation - Trigger most likely viral URI. No pneumonia on 2 view CXR. Flu PCR was negative. Patient has been off cigarettes for several years. She reports having ran out of her albuterol for several months, though she was compliant with her Formoterol and Budesonide nebs as prescribed by Dr. Melvyn Novas. She improved clinically with duonebs q6hrs scheduled, doxycycline 100 mg bid, prednisone oral 40 mg. She was ambulated by PT and did not desaturate below 92%, though she did feel subjectively dyspneic. The therapist recommended home health PT and RN for chronic disease management, as well as outpatient cardiac rehabilitation after her home health course,  which can be arranged through follow up with Dr. Terrence Dupont. She was discharged in stable condition at her baseline respiratory status.  2. Hypertension - BP high of 183/56 on morning of discharge. Her hypertension medications were held on admission due to soft BPs. I restarted her home amlodipine/olmesartan 10-40 daily with BP 130/50 upon discharge.  3. CAD - Patient denied active chest pain. EKG did not reveal ischemic changes. Troponins were trended and negative x3. Recommend follow up with Dr. Terrence Dupont for outpatient cardiac rehabilitation after her home health course.  4. Hypokalemia - K was 3.3 on admission, resolved to 4.6 after oral K-dur repletion.   Discharge Vitals:   BP 130/50  Pulse 63  Temp(Src) 97.8 F (36.6 C) (Oral)  Resp 18  Ht 5\' 2"  (1.575 m)  Wt 172 lb 2.9 oz (78.1 kg)  BMI 31.48 kg/m2  SpO2 98%  Discharge Labs:  BMET    Component Value Date/Time   NA 138 08/08/2013 0600   K 4.6 08/08/2013 0600   CL 105 08/08/2013 0600   CO2 24 08/08/2013 0600   GLUCOSE 122* 08/08/2013 0600   BUN 17 08/08/2013 0600   CREATININE 0.89 08/08/2013 0600   CALCIUM 8.9 08/08/2013 0600   GFRNONAA 64* 08/08/2013 0600    GFRAA 74* 08/08/2013 0600    CBC    Component Value Date/Time   WBC 5.9 08/07/2013 1324   RBC 4.09 08/07/2013 1324   HGB 11.8* 08/07/2013 1324   HCT 35.6* 08/07/2013 1324   PLT 227 08/07/2013 1324   MCV 87.0 08/07/2013 1324   MCH 28.9 08/07/2013 1324   MCHC 33.1 08/07/2013 1324   RDW 14.7 08/07/2013 1324   LYMPHSABS 2.2 04/06/2013 0518   MONOABS 0.8 04/06/2013 0518   EOSABS 0.2 04/06/2013 0518   BASOSABS 0.0 04/06/2013 0518    Signed: Lesly Dukes, MD 08/09/2013, 10:56 AM   Time Spent on Discharge: 40 minutes Services Ordered on Discharge: Home health PT, RN Equipment Ordered on Discharge: None

## 2013-08-09 NOTE — Progress Notes (Signed)
All d/c instructions explained and given to pt.  Verbalized understanding.Pt d/c to home.  Left via w/c to awaiting transport by  NT covering for oncoming NT.

## 2013-08-09 NOTE — Evaluation (Signed)
Physical Therapy Evaluation Patient Details Name: [name] MRN: [id number] DOB: 16-Oct-1940 Today's Date: 08/09/2013 Time: [id number] PT Time Calculation (min): 27 min  PT Assessment / Plan / Recommendation History of Present Illness   72 year old female with a history of hypertension, hyperlipidemia, COPD who presents emergency Department 5 days of shortness of breath worse with exertion and cough with white sputum production and wheezing. She denies history of fevers or chills. She reports chest pain that is increased with deep inhalation. No sore throat, earaches, post nasal drip, nasal congestion, or increased mucus production. Patient denies history of recent contact with anyone with URI, symptoms. Her appetite has been reduced since onset of symptoms. She also reports that she ran out of her albuterol both inhalers and nebulizer several, months ago, and her PCP was unable to refill them. She denies other symptoms of myalgias, arthralgias, diarrhea, or vomiting. No abdominal pain.  Clinical Impression  Pt admitted with above. Pt currently with functional limitations due to the deficits listed below (see PT Problem List).  Pt will benefit from skilled PT to increase their independence and safety with mobility and facilitate safe return to daily activities.   For discharge today; see below for recommendations; Stressed self-monitor for activity tolerance     PT Assessment  All further PT needs can be met in the next venue of care    Follow Up Recommendations  Home health PT;Supervision - Intermittent;Other (comment) [facility] for chronic disease management) Also recommend Outpt Cardiac Rehab after home health course -- this can be arranged through follow-up with Dr. Terrence Dupont    Does the patient have the potential to tolerate intense rehabilitation      Barriers to Discharge        Equipment Recommendations  Rolling walker with 5" wheels;3in1 (PT);Other (comment) (Will likely be able  to use husband's RW)    Recommendations for Other Services OT consult   Frequency      Precautions / Restrictions Precautions Precaution Comments: Monitor for activity tolerance Restrictions Weight Bearing Restrictions: No   Pertinent Vitals/Pain See amb section      Mobility  Bed Mobility Bed Mobility: Not assessed Transfers Transfers: Sit to Stand;Stand to Sit Sit to Stand: 5: Supervision Stand to Sit: 5: Supervision Details for Transfer Assistance: Overall smooth transition when not taxed; signifiacant decr control of desent with stand to sit when fatigued Ambulation/Gait Ambulation/Gait Assistance: 5: Supervision;4: Min guard Ambulation Distance (Feet): 200 Feet (with 2 seated rest breaks) Assistive device: Rolling walker Ambulation/Gait Assistance Details: Cues to use RW for energy conservation and steadiness; Noted prt tending to reach out for UE supprot in room; After approx 100 ft, pt quite fatigue and dyspneic (DOE 3/4) O2 sats 92 observed lowest; Recovered post approx 3-4 minutes of seated rest; Cues to self-monitor for activity tolerande Gait Pattern: Step-through pattern General Gait Details: incr unsteadiness with fatigue; recommend pt use RW full-time Stairs: Yes Stairs Assistance: 4: Min guard Stair Management Technique: One rail Left;Alternating pattern;Forwards Number of Stairs: 5    Exercises     PT Diagnosis: Difficulty walking  PT Problem List: Decreased activity tolerance;Decreased balance;Decreased mobility;Decreased knowledge of use of DME;Decreased knowledge of precautions;Cardiopulmonary status limiting activity PT Treatment Interventions:       PT Goals(Current goals can be found in the care plan section) Acute Rehab PT Goals Patient Stated Goal: very much values her independence PT Goal Formulation: No goals set, d/c therapy  Visit Information  Last PT Received On: 08/09/13 Assistance Needed: +1  History of Present Illness:  72 year old  female with a history of hypertension, hyperlipidemia, COPD who presents emergency Department 5 days of shortness of breath worse with exertion and cough with white sputum production and wheezing. She denies history of fevers or chills. She reports chest pain that is increased with deep inhalation. No sore throat, earaches, post nasal drip, nasal congestion, or increased mucus production. Patient denies history of recent contact with anyone with URI, symptoms. Her appetite has been reduced since onset of symptoms. She also reports that she ran out of her albuterol both inhalers and nebulizer several, months ago, and her PCP was unable to refill them. She denies other symptoms of myalgias, arthralgias, diarrhea, or vomiting. No abdominal pain.       Prior [place] expects to be discharged to:: Private residence Living Arrangements: Children Available Help at Discharge: Family;Available PRN/intermittently Type of Home: House Home Access: Stairs to enter [employer] of Steps: 5 Entrance Stairs-Rails: Left Home Layout: One level Home Equipment: Grab bars - tub/shower;Hand held shower head Additional Comments: Can use husband's RW Prior Function Level of Independence: Independent Comments: Pt does not drive however son or dtr takes pt to grocery store Communication Communication: No difficulties Dominant Hand: Right    Cognition  Cognition Arousal/Alertness: Awake/alert Behavior During Therapy: WFL for tasks assessed/performed Overall Cognitive Status: Within Functional Limits for tasks assessed    Extremity/Trunk Assessment Upper Extremity Assessment Upper Extremity Assessment: Defer to OT evaluation;Overall [facility] for tasks assessed Lower Extremity Assessment Lower Extremity Assessment: Overall WFL for tasks assessed (However with noted unsteadiness with fatigue)   Balance    End of Session PT - End of Session Equipment Utilized During Treatment:  Other (comment) (pulse oximeter) Activity Tolerance: Patient tolerated treatment well;Other (comment) (somewhat receptive to recommendations) Patient left: in chair;with call bell/phone within reach Nurse Communication: Mobility status  GP     Roney Marion [facility] 08/09/2013, 10:48 AM Roney Marion, [place]

## 2013-08-09 NOTE — Progress Notes (Signed)
  Date: 08/09/2013  Patient name: [name]  Medical record number: [id number]  Date of birth: Jan 23, 1941   This patient has been seen and the plan of care was discussed with the house staff. Please see their note for complete details. I concur with their findings with the following additions/corrections: Pt feels back to baseline in regards to COPD flare. She has Dr Melvyn Novas as her pul and Dr Terrence Dupont as her cards and will F/U as outpt. Stable for D/C home.  Bartholomew Crews, MD 08/09/2013, 3:18 PM

## 2013-08-09 NOTE — Progress Notes (Addendum)
Subjective:    Patient seen and examined at the bedside this morning. She says she feels back to normal. She has a dry cough which is chronic. Her shortness of breath is at baseline. She was able to walk with the nurse off her oxygen yesterday and did not desaturate below 94%.    Objective:     Last BM Date: 08/07/13   Weights: 24-hour Weight change: 3 lb 4.5 oz (1.488 kg)  Filed Weights   08/07/13 1944 08/08/13 0554 08/09/13 0549  Weight: 168 lb 14.4 oz (76.613 kg) 169 lb 8 oz (76.885 kg) 172 lb 2.9 oz (78.1 kg)     Intake/Output:   Intake/Output Summary (Last 24 hours) at 08/09/13 0903 Last data filed at 08/09/13 0700  Gross per 24 hour  Intake   2782 ml  Output   1000 ml  Net   1782 ml       Physical Exam:  Vital Signs:   Temp:  [97.6 F (36.4 C)-98.2 F (36.8 C)] 97.8 F (36.6 C) (12/28 0549) Pulse Rate:  [54-70] 54 (12/28 0729) Resp:  [17-18] 18 (12/28 0729) BP: (125-183)/(37-59) 183/56 mmHg (12/28 0549) SpO2:  [95 %-96 %] 96 % (12/28 0729) Weight:  [172 lb 2.9 oz (78.1 kg)] 172 lb 2.9 oz (78.1 kg) (12/28 0549)  General: well developed, well nourished, no acute distress  Lungs/Chest wall: Mild expiratory wheezing bilaterally, otherwise CTA with normal work of breathing. On room air. Heart: normal rate, RRR; no murmurs. No JVD Pulses: peripheral pulse are 2+ and symmetric  Abdomen: Normal fullness, nontender to palpation,  no masses or organomegaly  Skin: warm, dry, intact, no rashes  Extremities: no peripheral edema, warm, peripheral pulses are present. Normal power.  Neurologic: A&O X3, CN II - XII are grossly intact.   Labs: Basic Metabolic Panel:  Recent Labs Lab 08/07/13 1324 08/08/13 0600  NA 139 138  K 3.3* 4.6  CL 100 105  CO2 28 24  GLUCOSE 116* 122*  BUN 13 17  CREATININE 1.04 0.89  CALCIUM 9.1 8.9  MG 2.1  --     Liver Function Tests:  Recent Labs Lab 08/08/13 0600  AST 18  ALT 13  ALKPHOS 62  BILITOT 0.2*  PROT 6.9    ALBUMIN 3.2*     CBC:  Recent Labs Lab 08/07/13 1324  WBC 5.9  HGB 11.8*  HCT 35.6*  MCV 87.0  PLT 227    Cardiac Enzymes:  Recent Labs Lab 08/07/13 2230 08/08/13 0600 08/08/13 0840  TROPONINI <0.30 <0.30 <0.30    CBG: No results found for this basename: GLUCAP,  in the last 168 hours  Coagulation Studies:  Recent Labs  08/08/13 0600  LABPROT 13.8  INR 1.08    Microbiology: Results for orders placed during the hospital encounter of 04/01/13  CLOSTRIDIUM DIFFICILE BY PCR     Status: None   Collection Time    04/01/13  1:53 PM      Result Value Range Status   C difficile by pcr NEGATIVE  NEGATIVE Final  STOOL CULTURE     Status: None   Collection Time    04/01/13  1:53 PM      Result Value Range Status   Specimen Description STOOL   Final   Special Requests NONE   Final   Culture     Final   Value: SALMONELLA SPECIES     Note: SALMONELLA TYPHIMURIUM CRITICAL RESULT CALLED TO, READ BACK BY AND VERIFIED WITH:  [place] AT 555PM 04/05/13 [place] [id number] BY [place] BY [facility] LAB IN [place], [place]     Performed at [employer]   Report Status 05/07/2013 FINAL   Final   Organism ID, Bacteria SALMONELLA SPECIES   Final  MRSA PCR SCREENING     Status: None   Collection Time    04/01/13  2:33 PM      Result Value Range Status   MRSA by PCR NEGATIVE  NEGATIVE Final   Comment:            The GeneXpert MRSA Assay (FDA     approved for NASAL specimens     only), is one component of a     comprehensive MRSA colonization     surveillance program. It is not     intended to diagnose MRSA     infection nor to guide or     monitor treatment for     MRSA infections.     Imaging: Dg Chest 2 View  08/07/2013   CLINICAL DATA:  Shortness of breath and cough  EXAM: CHEST  2 VIEW  COMPARISON:  04/01/2013  FINDINGS: The cardiac shadow is stable. Postoperative changes are again seen. The lungs are clear  bilaterally. No acute bony abnormality is seen.  IMPRESSION: No active cardiopulmonary disease.   Electronically Signed   By: [name] M.D.   On: 08/07/2013 12:39     Medications:    Infusions: . albuterol Stopped (08/07/13 1630)     Scheduled Medications: . amLODipine  10 mg Oral Daily   And  . irbesartan  300 mg Oral Daily  . atorvastatin  20 mg Oral q1800  . clopidogrel  75 mg Oral Daily  . doxycycline  100 mg Oral Q12H  . enoxaparin (LOVENOX) injection  40 mg Subcutaneous Q24H  . ipratropium  0.5 mg Nebulization TID  . levalbuterol  0.63 mg Nebulization TID  . mometasone-formoterol  2 puff Inhalation BID  . pantoprazole  40 mg Oral Daily  . predniSONE  60 mg Oral Q breakfast  . sodium chloride  3 mL Intravenous Q12H    PRN Medications: acetaminophen, dextromethorphan-guaiFENesin, levalbuterol   Assessment/ Plan:    72 year old female with a history of hypertension, hyperlipidemia, COPD who presents emergency Department 5 days of shortness of breath worse with exertion and cough with white sputum production and wheezing.  #COPD exacerbation: Trigger most likely viral URI. No pneumonia on 2 view CXR.  Flu > Negative. Patient has been off cigarettes for several years. She reports having ran out of her albuterol for several months. She is compliant with her Formoterol and Budesonide nebs as prescribed by Dr. Melvyn Novas.  She has improved since yesterday. O2 sats remained >94% on ambulation in hallway yesterday. - Continue with duonebs q6hrs scheduled  - Continue with doxycycline 100 mg bid. This is day #2/7. End date 08/13/12 - Continue with prednisone oral 40 mg. End date 12/30 - Discontinue IVF - PT eval and treat - Care management consult to make sure she can afford doxycycline and prednisone - Medically stable for discharge after PT and care management evaluation  #Hypertension - BP high of 183/56 this morning. Medications were held on admission due to soft BPs. - Restart  home amlodipine/olmesartan  #CAD: Denies active chest pain. EKG does not reveal ischemic changes. Troponins negative x3. - Continue to monitor  #Hypokalemia: Resolved.  Dispo: Disposition is deferred at this time,  awaiting improvement of current medical problems. Will likely be discharged today.  The patient does have a current PCP [name], MD), therefore is not require OPC follow-up after discharge.  The patient does not have transportation limitations that hinder transportation to clinic appointments.    Length of Stay: 2 days   Signed by:  Lesly Dukes, MD  [email or web address] Pager # [phone] Office # [phone]   08/09/2013, 9:03 AM

## 2013-08-09 NOTE — Progress Notes (Signed)
   CARE MANAGEMENT NOTE 08/09/2013  Patient:  GOWRI, GOODIN   Account Number:  [id number]  Date Initiated:  08/09/2013  Documentation initiated by:  [facility]  Subjective/Objective Assessment:   adm: shortness of breath worse with exertion and cough with white sputum production and wheezing     Action/Plan:   discharge planning   Anticipated DC Date:  08/09/2013   Anticipated DC Plan:  [place]  CM consult  Medication Assistance      [facility] Choice  HOME HEALTH   Choice offered to / List presented to:  C-1 Patient        [place] arranged  HH-1 RN  [place].   Status of service:  Completed, signed off Medicare Important Message given?   (If response is "NO", the following Medicare IM given date fields will be blank) Date Medicare IM given:   Date Additional Medicare IM given:    Discharge Disposition:  [place]  Per UR Regulation:    If discussed at Long Length of Stay Meetings, dates discussed:    Comments:  08/09/13 09:25 Cm spoke with pt in room and gave her Kristopher Oppenheim handout on medication assistance.  Also, gave pt 75% med card, and information regarding [facility] 4$ medications. Pt not eligible for MATCH bc she has insurance.  Pt also offered choice for HH.  Referral faxed to [facility] for HHPT/RN. Address and contact numbers verified.  No other CM needs were communicated.  Mariane Masters, BSN, CM [phone].

## 2013-08-09 NOTE — Evaluation (Signed)
Occupational Therapy Evaluation Patient Details Name: [name] MRN: [id number] DOB: 1940/11/08 Today's Date: 08/09/2013 Time: [id number] OT Time Calculation (min): 13 min  OT Assessment / Plan / Recommendation History of present illness  72 year old female with a history of hypertension, hyperlipidemia, COPD who presents emergency Department 5 days of shortness of breath worse with exertion and cough with white sputum production and wheezing. She denies history of fevers or chills. She reports chest pain that is increased with deep inhalation. No sore throat, earaches, post nasal drip, nasal congestion, or increased mucus production. Patient denies history of recent contact with anyone with URI, symptoms. Her appetite has been reduced since onset of symptoms. She also reports that she ran out of her albuterol both inhalers and nebulizer several, months ago, and her PCP was unable to refill them. She denies other symptoms of myalgias, arthralgias, diarrhea, or vomiting. No abdominal pain.   Clinical Impression   Pt admitted with above. Pt currently performing ADLs at overall supervision level.  Educated pt on energy conservation techniques and recommended use of shower chair (which she has access to at home). No further acute OT services needed. Will sign off.    OT Assessment  Patient does not need any further OT services    Follow Up Recommendations  No OT follow up;Supervision/Assistance - 24 hour    Barriers to Discharge      Equipment Recommendations  3 in 1 bedside comode    Recommendations for Other Services    Frequency       Precautions / Restrictions Precautions Precaution Comments: Monitor for activity tolerance   Pertinent Vitals/Pain SpO2 >90% on RA during activity and rest.    ADL  Grooming: Performed;Wash/dry hands;Supervision/safety Where Assessed - Grooming: Unsupported standing Lower Body Dressing: Performed;Supervision/safety Where Assessed - Lower Body  Dressing: Unsupported sit to stand Toilet Transfer: Performed;Supervision/safety Toilet Transfer Method: Sit to Loss adjuster, chartered: Comfort height toilet Toileting - Clothing Manipulation and Hygiene: Performed;Supervision/safety Where Assessed - Best boy and Hygiene: Sit to stand from 3-in-1 or toilet Equipment Used: Rolling walker Transfers/Ambulation Related to ADLs: supervision with RW ADL Comments: Dyspnea 3/4 during toileting tasks in room.  Educated pt on energy conservation techniques at home and to sit when possible during ADL/IADL tasks. Pt states that her son does much of the housework and cooking now for her.  Recommended use of shower chair and suggested decreasing temp of shower water to maximize breathing.  Pt states she does have access to a shower chair. States if chair doesn't fit in tub, one of her children can go purchase one for her.    OT Diagnosis:    OT Problem List:   OT Treatment Interventions:     OT Goals(Current goals can be found in the care plan section) Acute Rehab OT Goals Patient Stated Goal: to return home  Visit Information  Last OT Received On: 08/09/13 Assistance Needed: +1 History of Present Illness:  72 year old female with a history of hypertension, hyperlipidemia, COPD who presents emergency Department 5 days of shortness of breath worse with exertion and cough with white sputum production and wheezing. She denies history of fevers or chills. She reports chest pain that is increased with deep inhalation. No sore throat, earaches, post nasal drip, nasal congestion, or increased mucus production. Patient denies history of recent contact with anyone with URI, symptoms. Her appetite has been reduced since onset of symptoms. She also reports that she ran out of her albuterol both inhalers and  nebulizer several, months ago, and her PCP was unable to refill them. She denies other symptoms of myalgias, arthralgias, diarrhea,  or vomiting. No abdominal pain.       Prior [place] expects to be discharged to:: Private residence Living Arrangements: Children Available Help at Discharge: Family;Available PRN/intermittently Type of Home: House Home Access: Stairs to enter [employer] of Steps: 5 Entrance Stairs-Rails: Left Home Layout: One level Home Equipment: Grab bars - tub/shower;Hand held shower head Additional Comments: has access to a shower chair. Prior Function Level of Independence: Independent Comments: Pt does not drive however son or dtr takes pt to grocery store Communication Communication: No difficulties Dominant Hand: Right         Vision/Perception     Cognition  Cognition Arousal/Alertness: Awake/alert Behavior During Therapy: WFL for tasks assessed/performed Overall Cognitive Status: Within Functional Limits for tasks assessed    Extremity/Trunk Assessment Upper Extremity Assessment Upper Extremity Assessment: Overall WFL for tasks assessed     Mobility Bed Mobility Bed Mobility: Not assessed Transfers Transfers: Sit to Stand;Stand to Sit Sit to Stand: 5: Supervision;From chair/3-in-1;From toilet Stand to Sit: 5: Supervision;To chair/3-in-1;To toilet Details for Transfer Assistance: supervision for safety     Exercise     Balance     End of Session OT - End of Session Equipment Utilized During Treatment: Rolling walker Activity Tolerance: Patient tolerated treatment well Patient left: in chair;with call bell/phone within reach  GO    08/09/2013 Darrol Jump OTR/L Pager [phone] Office [phone]  Darrol Jump 08/09/2013, 2:28 PM

## 2013-09-29 ENCOUNTER — Other Ambulatory Visit: Payer: Self-pay | Admitting: Vascular Surgery

## 2013-09-29 DIAGNOSIS — I6529 Occlusion and stenosis of unspecified carotid artery: Secondary | ICD-10-CM

## 2013-09-29 DIAGNOSIS — Z48812 Encounter for surgical aftercare following surgery on the circulatory system: Secondary | ICD-10-CM

## 2013-10-16 ENCOUNTER — Ambulatory Visit: Payer: Medicare Other | Admitting: Neurology

## 2013-11-16 ENCOUNTER — Other Ambulatory Visit (HOSPITAL_COMMUNITY): Payer: Self-pay | Admitting: Family Medicine

## 2013-11-16 DIAGNOSIS — Z1231 Encounter for screening mammogram for malignant neoplasm of breast: Secondary | ICD-10-CM

## 2013-11-25 ENCOUNTER — Telehealth: Payer: Self-pay

## 2013-11-25 NOTE — Telephone Encounter (Signed)
Phone call from pt.  Stated she saw her PCP, Dr. Coletta Memos, and reported she stays "drunk-feeling, light-headed, and dizzy-feeling all the time."  Stated her PCP is recommending for her to move the 1 yr. f/u appt., to an earlier date.  Denies any unilateral weakness, but states "I just feel weak all over."  Denies any problems with speech, facial droop, or confusion.  States no vision loss noted in left eye, but is "legally blind in the right eye."  Adv. pt. will contact her regarding an earlier appt. for f/u.

## 2013-12-08 ENCOUNTER — Encounter: Payer: Self-pay | Admitting: Family

## 2013-12-09 ENCOUNTER — Encounter: Payer: Self-pay | Admitting: Family

## 2013-12-09 ENCOUNTER — Ambulatory Visit (HOSPITAL_COMMUNITY)
Admission: RE | Admit: 2013-12-09 | Discharge: 2013-12-09 | Disposition: A | Discharge status: Home / Self Care | Payer: Medicare HMO | Source: Ambulatory Visit | Attending: Family | Admitting: Family

## 2013-12-09 ENCOUNTER — Ambulatory Visit (INDEPENDENT_AMBULATORY_CARE_PROVIDER_SITE_OTHER): Payer: Medicare HMO | Admitting: Family

## 2013-12-09 VITALS — BP 138/69 | HR 65 | Resp 16 | Ht 62.0 in | Wt 168.0 lb

## 2013-12-09 DIAGNOSIS — Z48812 Encounter for surgical aftercare following surgery on the circulatory system: Secondary | ICD-10-CM | POA: Insufficient documentation

## 2013-12-09 DIAGNOSIS — I6529 Occlusion and stenosis of unspecified carotid artery: Secondary | ICD-10-CM | POA: Insufficient documentation

## 2013-12-09 DIAGNOSIS — I739 Peripheral vascular disease, unspecified: Secondary | ICD-10-CM

## 2013-12-09 HISTORY — DX: Occlusion and stenosis of unspecified carotid artery: I65.29

## 2013-12-09 NOTE — Patient Instructions (Signed)

## 2013-12-09 NOTE — Progress Notes (Signed)
Established Carotid Patient   History of Present Illness  [name] is a 73 y.o. female patient of Dr. Scot Dock who is s/p left carotid endarterectomy in August of 2011. We have been following a moderate right carotid stenosis. She comes in for a yearly follow up visit.   Patient has Negative history of TIA or stroke symptom.  The patient denies amaurosis fugax or monocular blindness.  The patient  denies facial drooping.  Pt. denies hemiplegia.  The patient denies receptive or expressive aphasia.    She denies claudication type symptoms in her legs with walking, denies non healing wounds. She does complain that her legs feel like they will not hold her up when standing. She has had a syncopal episode in 1998, lost vision temporarily, and 2-3 weeks ago she had a similar pre-syncopal episode. Patient denies that dizziness is worse with raising arms above head. Patient denies tingling, numbness, or weakness in either hand or arm. Has been seeing Dr. Jannifer Franklin s/p craniotomy for Arnold-Chiari malformation.  Was hospitalized at [facility] Dec., 2014 for dizziness, falling, evaluation was inconclusive per pt.  Pt Diabetic: No Pt smoker: non-smoker  Pt meds include: Statin : Yes ASA: Yes, Dr. Terrence Dupont advised pt to resume recently, 81 mg Other anticoagulants/antiplatelets: Plavix   Past Medical History  Diagnosis Date  . Asthma   . Reflux   . Hiatal hernia   . Peripheral vascular disease   . Hypertension   . Hyperlipidemia   . Iliac artery aneurysm   . Aortoiliac occlusive disease   . 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 Carotid artery occlusion   . Myocardial infarction 01/01/2000    Cardiac catheterization  . COPD (chronic obstructive pulmonary disease)   . Coronary artery disease   . Arnold-Chiari malformation 1998  . 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  . Gastroesophageal reflux disease     Social History History  Substance Use Topics  . Smoking status: Former  Smoker -- 1.00 packs/day for 30 years    Types: Cigarettes    Quit date: 08/13/2000  . Smokeless tobacco: Never Used  . Alcohol Use: No    Family History Family History  Problem Relation Age of Onset  . Heart disease Mother     Heart Disease before age 61  . Hypertension Mother   . Hyperlipidemia Mother   . Heart attack Mother   . Clotting disorder Mother   . Heart disease Father     Heart Disease before age 36  . Heart attack Father   . Heart disease Brother     Heart Disease before age 46  . Hyperlipidemia Brother   . Hypertension Brother   . Clotting disorder Brother   . Cerebral aneurysm Sister   . Asthma Sister   . Cerebral aneurysm Brother   . Cancer Brother   . Heart disease Brother   . Heart disease Brother   . Heart disease Brother     Surgical History Past Surgical History  Procedure Laterality Date  . Eye surgery      Laser surgery for retinal hemorrhage  . Rotator cuff repair      Right  . Coronary artery bypass graft  01/01/2000  . Post coronary artery  bpg  01/05/2000    Right jugular sheath removed  . Carotid endarterectomy  03/29/2010    Left  CEA  . Pr vein bypass graft,aorto-fem-pop    . Arnold-chiari malformation repair  1998    Suboccipital craniectomy  .  Corneal transplant      Right  . Appendectomy    . Cholecystectomy    . Abdominal hysterectomy      Allergies  Allergen Reactions  . Codeine Other (See Comments)    Dr. Terrence Dupont advised patient not to take this medication  . Oxycodone-Acetaminophen Other (See Comments)    Says it makes her feel weird  . Avelox [Moxifloxacin Hcl In Nacl] Palpitations    Current Outpatient Prescriptions  Medication Sig Dispense Refill  . albuterol (PROVENTIL) (2.5 MG/3ML) 0.083% nebulizer solution Take 2.5 mg by nebulization every 4 (four) hours as needed for wheezing or shortness of breath (((PLAN C))).       . albuterol (VENTOLIN HFA) 108 (90 BASE) MCG/ACT inhaler Inhale 2 puffs into the lungs every  4 (four) hours as needed for wheezing or shortness of breath (((PLAN B))).  1 Inhaler  11  . amLODipine-olmesartan (AZOR) 10-40 MG per tablet Take 1 tablet by mouth daily.      . budesonide (PULMICORT) 0.25 MG/2ML nebulizer solution One twice daily with perforomist  120 mL  12  . cetirizine (ZYRTEC) 1 MG/ML syrup Take 5 mg by mouth as needed (allergies).      . cloNIDine (CATAPRES) 0.1 MG tablet Take 0.1 mg by mouth 2 (two) times daily. Take 1 1/2 twice daily      . clopidogrel (PLAVIX) 75 MG tablet Take 1 tablet (75 mg total) by mouth daily.  90 tablet  3  . dexlansoprazole (DEXILANT) 60 MG capsule Take 1 capsule (60 mg total) by mouth daily before breakfast.  30 capsule  11  . formoterol (PERFOROMIST) 20 MCG/2ML nebulizer solution Take 20 mcg by nebulization daily. Half dose per day.      . nitroGLYCERIN (NITROSTAT) 0.4 MG SL tablet Place 0.4 mg under the tongue continuous as needed for chest pain.       . rosuvastatin (CRESTOR) 10 MG tablet Take 10 mg by mouth at bedtime.       Marland Kitchen acidophilus (RISAQUAD) CAPS capsule Take 2 capsules by mouth daily.  40 capsule  0  . predniSONE (DELTASONE) 20 MG tablet Take 3 tabs (60mg ) with breakfast tomorrow and Tues, 2 tabs (40mg ) w/ breakfast Weds and Thurs, and 1 tab (20 mg) w/ breakfast Fri and Sat.  12 tablet  0   No current facility-administered medications for this visit.    Review of Systems : See HPI for pertinent positives and negatives.  Physical Examination  Filed Vitals:   12/09/13 1401  BP: 138/69  Pulse: 65  Resp:    Filed Weights   12/09/13 1357  Weight: 168 lb (76.204 kg)   Body mass index is 30.72 kg/(m^2).   General: WDWN obese female in NAD GAIT: normal Eyes: PERRLA Pulmonary:  Non-labored, CTAB, Negative  Rales, Negative rhonchi, & Negative wheezing.  Cardiac: regular Rhythm ,  Negative detected murmur.  VASCULAR EXAM Carotid Bruits Left Right   positive positive    Aorta is not palpable. Radial pulses are  palpable  and equal.  LE Pulses LEFT RIGHT       POPLITEAL  not palpable   not palpable       POSTERIOR TIBIAL  not palpable   not palpable        DORSALIS PEDIS      ANTERIOR TIBIAL 2+ palpable  2+ palpable     Gastrointestinal: soft, nontender, BS WNL, no r/g,  negative masses.  Musculoskeletal: Negative muscle atrophy/wasting. M/S 5/5 throughout, Extremities without ischemic changes.  Neurologic: A&O X 3; Appropriate Affect ; SENSATION ;normal;  Speech is normal CN 2-12 intact, Pain and light touch intact in extremities, Motor exam as listed above.   Non-Invasive Vascular Imaging CAROTID DUPLEX 12/09/2013   CEREBROVASCULAR DUPLEX EVALUATION    INDICATION: Carotid endarterectomy     PREVIOUS INTERVENTION(S): Left carotid endarterectomy on 03/29/10    DUPLEX EXAM:     RIGHT  LEFT  Peak Systolic Velocities (cm/s) End Diastolic Velocities (cm/s) Plaque LOCATION Peak Systolic Velocities (cm/s) End Diastolic Velocities (cm/s) Plaque  82 0  CCA PROXIMAL 90 16   73 11  CCA MID 72 13   68 12 HT CCA DISTAL 77 12   120 10  ECA 108 7   151 42 HT ICA PROXIMAL 98 25   86 21  ICA MID 96 24   75 18  ICA DISTAL 106 24     2.2 ICA / CCA Ratio (PSV) Not Calculated  Antegrade Vertebral Flow Antegrade  782 Brachial Systolic Pressure (mmHg) 956  Multiphasic (subclavian artery) Brachial Artery Waveforms Multiphasic (subclavian artery)    Plaque Morphology:  HM = Homogeneous, HT = Heterogeneous, CP = Calcific Plaque, SP = Smooth Plaque, IP = Irregular Plaque     ADDITIONAL FINDINGS: No significant stenosis of the bilateral external or common carotid arteries.    IMPRESSION: 1. Patent left carotid endarterectomy site with no left internal carotid artery stenosis. 2. Doppler velocities suggest a 40-59% stenosis of the right proximal internal carotid artery.    Compared to  the previous exam:  No significant change noted when compared to the previous exam on 01/07/13.      Assessment: 3. [name] is a 73 y.o. female who presents with s/p left carotid endarterectomy in August of 2011. 4.  5. Patent left carotid endarterectomy site with no left internal carotid artery stenosis. 6. Doppler velocities suggest a 40-59% stenosis of the right proximal internal carotid artery. No significant change noted when compared to the previous exam on 01/07/13.  She has strongly palpable bilateral pedal pulses, leg weakness is not due to arterial insufficiency.  Discussed results of Duplex and exam with Dr. Scot Dock, her dizziness and leg weakness are not due to mild/moderate right proximal ICA stenosis and no left left ICA stenosis, not due to mild bilateral subclavian artery stenosis, not due to normal basilar antegrade flow.  Plan: Follow-up in 1 year with Carotid Duplex scan.   I discussed in depth with the patient the nature of atherosclerosis, and emphasized the importance of maximal medical management including strict control of blood pressure, blood glucose, and lipid levels, obtaining regular exercise, and continued cessation of smoking.  The patient is aware that without maximal medical management the underlying atherosclerotic disease process will progress, limiting the benefit of any interventions. The patient was given information about stroke prevention and what symptoms should prompt the patient to seek immediate medical care. Thank you for allowing [place] to participate in this patient's care.  Vinnie Level Nickel, RN, MSN, FNP-C [facility] [place] Office: [phone]  Clinic Physician: Scot Dock  12/09/2013 2:12 PM

## 2013-12-10 ENCOUNTER — Other Ambulatory Visit: Payer: Self-pay | Admitting: Internal Medicine

## 2013-12-14 ENCOUNTER — Other Ambulatory Visit (HOSPITAL_COMMUNITY): Payer: Self-pay | Admitting: Cardiology

## 2013-12-14 DIAGNOSIS — R079 Chest pain, unspecified: Secondary | ICD-10-CM

## 2013-12-21 ENCOUNTER — Ambulatory Visit (HOSPITAL_COMMUNITY)
Admission: RE | Admit: 2013-12-21 | Discharge: 2013-12-21 | Disposition: A | Discharge status: Home / Self Care | Payer: Medicare HMO | Source: Ambulatory Visit | Attending: Family Medicine | Admitting: Family Medicine

## 2013-12-21 DIAGNOSIS — Z1231 Encounter for screening mammogram for malignant neoplasm of breast: Secondary | ICD-10-CM | POA: Insufficient documentation

## 2013-12-30 ENCOUNTER — Encounter (HOSPITAL_COMMUNITY)
Admission: RE | Admit: 2013-12-30 | Discharge: 2013-12-30 | Disposition: A | Discharge status: Home / Self Care | Payer: Medicare HMO | Source: Ambulatory Visit | Attending: Cardiology | Admitting: Cardiology

## 2013-12-30 DIAGNOSIS — R079 Chest pain, unspecified: Secondary | ICD-10-CM

## 2013-12-30 MED ORDER — TECHNETIUM TC 99M SESTAMIBI GENERIC - CARDIOLITE
10.0000 | Freq: Once | INTRAVENOUS | Status: AC | PRN
  Administered 2013-12-30: 10 via INTRAVENOUS

## 2013-12-30 MED ORDER — REGADENOSON 0.4 MG/5ML IV SOLN
INTRAVENOUS | Status: AC
  Administered 2013-12-30: 0.4 mg via INTRAVENOUS
  Filled 2013-12-30: qty 5

## 2013-12-30 MED ORDER — REGADENOSON 0.4 MG/5ML IV SOLN
0.4000 mg | Freq: Once | INTRAVENOUS | Status: AC
  Administered 2013-12-30: 0.4 mg via INTRAVENOUS

## 2013-12-30 MED ORDER — TECHNETIUM TC 99M SESTAMIBI GENERIC - CARDIOLITE
30.0000 | Freq: Once | INTRAVENOUS | Status: AC | PRN
  Administered 2013-12-30: 30 via INTRAVENOUS

## 2014-01-06 ENCOUNTER — Ambulatory Visit: Payer: Medicare Other | Admitting: Vascular Surgery

## 2014-01-06 ENCOUNTER — Other Ambulatory Visit (HOSPITAL_COMMUNITY): Payer: Medicare Other

## 2014-06-30 ENCOUNTER — Encounter: Payer: Self-pay | Admitting: Neurology

## 2014-07-06 ENCOUNTER — Encounter: Payer: Self-pay | Admitting: Neurology

## 2014-08-02 ENCOUNTER — Emergency Department (HOSPITAL_COMMUNITY): Payer: Medicare HMO

## 2014-08-02 ENCOUNTER — Encounter (HOSPITAL_COMMUNITY): Payer: Self-pay | Admitting: *Deleted

## 2014-08-02 ENCOUNTER — Observation Stay (HOSPITAL_COMMUNITY): Payer: Medicare HMO

## 2014-08-02 ENCOUNTER — Inpatient Hospital Stay (HOSPITAL_COMMUNITY)
Admission: EM | Admit: 2014-08-02 | Discharge: 2014-08-04 | DRG: 287 | Disposition: A | Discharge status: Home / Self Care | Payer: Medicare HMO | Attending: Internal Medicine | Admitting: Internal Medicine

## 2014-08-02 DIAGNOSIS — J45909 Unspecified asthma, uncomplicated: Secondary | ICD-10-CM | POA: Diagnosis present

## 2014-08-02 DIAGNOSIS — Z825 Family history of asthma and other chronic lower respiratory diseases: Secondary | ICD-10-CM

## 2014-08-02 DIAGNOSIS — Z7951 Long term (current) use of inhaled steroids: Secondary | ICD-10-CM

## 2014-08-02 DIAGNOSIS — Z951 Presence of aortocoronary bypass graft: Secondary | ICD-10-CM

## 2014-08-02 DIAGNOSIS — D500 Iron deficiency anemia secondary to blood loss (chronic): Secondary | ICD-10-CM | POA: Diagnosis present

## 2014-08-02 DIAGNOSIS — F418 Other specified anxiety disorders: Secondary | ICD-10-CM | POA: Diagnosis present

## 2014-08-02 DIAGNOSIS — Z7982 Long term (current) use of aspirin: Secondary | ICD-10-CM

## 2014-08-02 DIAGNOSIS — Z8249 Family history of ischemic heart disease and other diseases of the circulatory system: Secondary | ICD-10-CM

## 2014-08-02 DIAGNOSIS — I739 Peripheral vascular disease, unspecified: Secondary | ICD-10-CM

## 2014-08-02 DIAGNOSIS — M7989 Other specified soft tissue disorders: Secondary | ICD-10-CM

## 2014-08-02 DIAGNOSIS — R0789 Other chest pain: Secondary | ICD-10-CM | POA: Diagnosis present

## 2014-08-02 DIAGNOSIS — Z87891 Personal history of nicotine dependence: Secondary | ICD-10-CM

## 2014-08-02 DIAGNOSIS — Z885 Allergy status to narcotic agent status: Secondary | ICD-10-CM

## 2014-08-02 DIAGNOSIS — Z955 Presence of coronary angioplasty implant and graft: Secondary | ICD-10-CM

## 2014-08-02 DIAGNOSIS — Z888 Allergy status to other drugs, medicaments and biological substances status: Secondary | ICD-10-CM

## 2014-08-02 DIAGNOSIS — I2582 Chronic total occlusion of coronary artery: Secondary | ICD-10-CM | POA: Diagnosis present

## 2014-08-02 DIAGNOSIS — M79604 Pain in right leg: Secondary | ICD-10-CM

## 2014-08-02 DIAGNOSIS — I25710 Atherosclerosis of autologous vein coronary artery bypass graft(s) with unstable angina pectoris: Secondary | ICD-10-CM | POA: Diagnosis not present

## 2014-08-02 DIAGNOSIS — R079 Chest pain, unspecified: Secondary | ICD-10-CM | POA: Diagnosis not present

## 2014-08-02 DIAGNOSIS — R51 Headache: Secondary | ICD-10-CM

## 2014-08-02 DIAGNOSIS — G43909 Migraine, unspecified, not intractable, without status migrainosus: Secondary | ICD-10-CM | POA: Diagnosis present

## 2014-08-02 DIAGNOSIS — R519 Headache, unspecified: Secondary | ICD-10-CM | POA: Insufficient documentation

## 2014-08-02 DIAGNOSIS — I25110 Atherosclerotic heart disease of native coronary artery with unstable angina pectoris: Secondary | ICD-10-CM | POA: Diagnosis present

## 2014-08-02 DIAGNOSIS — Z8489 Family history of other specified conditions: Secondary | ICD-10-CM

## 2014-08-02 DIAGNOSIS — Z7902 Long term (current) use of antithrombotics/antiplatelets: Secondary | ICD-10-CM

## 2014-08-02 DIAGNOSIS — I671 Cerebral aneurysm, nonruptured: Secondary | ICD-10-CM

## 2014-08-02 DIAGNOSIS — K219 Gastro-esophageal reflux disease without esophagitis: Secondary | ICD-10-CM

## 2014-08-02 DIAGNOSIS — I10 Essential (primary) hypertension: Secondary | ICD-10-CM

## 2014-08-02 DIAGNOSIS — G629 Polyneuropathy, unspecified: Secondary | ICD-10-CM | POA: Diagnosis present

## 2014-08-02 DIAGNOSIS — I252 Old myocardial infarction: Secondary | ICD-10-CM

## 2014-08-02 DIAGNOSIS — Z823 Family history of stroke: Secondary | ICD-10-CM

## 2014-08-02 DIAGNOSIS — E780 Pure hypercholesterolemia: Secondary | ICD-10-CM | POA: Diagnosis present

## 2014-08-02 DIAGNOSIS — J449 Chronic obstructive pulmonary disease, unspecified: Secondary | ICD-10-CM

## 2014-08-02 DIAGNOSIS — E785 Hyperlipidemia, unspecified: Secondary | ICD-10-CM

## 2014-08-02 LAB — CBC WITH DIFFERENTIAL/PLATELET
Basophils Absolute: 0 10*3/uL (ref 0.0–0.1)
Basophils Relative: 0 % (ref 0–1)
Eosinophils Absolute: 0.3 10*3/uL (ref 0.0–0.7)
Eosinophils Relative: 4 % (ref 0–5)
HEMATOCRIT: 33.4 % — AB (ref 36.0–46.0)
HEMOGLOBIN: 11 g/dL — AB (ref 12.0–15.0)
LYMPHS ABS: 3.1 10*3/uL (ref 0.7–4.0)
LYMPHS PCT: 37 % (ref 12–46)
MCH: 28.6 pg (ref 26.0–34.0)
MCHC: 32.9 g/dL (ref 30.0–36.0)
MCV: 86.8 fL (ref 78.0–100.0)
MONO ABS: 0.7 10*3/uL (ref 0.1–1.0)
MONOS PCT: 8 % (ref 3–12)
NEUTROS PCT: 51 % (ref 43–77)
Neutro Abs: 4.4 10*3/uL (ref 1.7–7.7)
Platelets: 232 10*3/uL (ref 150–400)
RBC: 3.85 MIL/uL — ABNORMAL LOW (ref 3.87–5.11)
RDW: 13.7 % (ref 11.5–15.5)
WBC: 8.6 10*3/uL (ref 4.0–10.5)

## 2014-08-02 LAB — BASIC METABOLIC PANEL
Anion gap: 12 (ref 5–15)
BUN: 12 mg/dL (ref 6–23)
CHLORIDE: 101 meq/L (ref 96–112)
CO2: 26 meq/L (ref 19–32)
CREATININE: 0.87 mg/dL (ref 0.50–1.10)
Calcium: 9.6 mg/dL (ref 8.4–10.5)
GFR calc non Af Amer: 65 mL/min — ABNORMAL LOW (ref >90)
GFR, EST AFRICAN AMERICAN: 76 mL/min — AB (ref >90)
GLUCOSE: 103 mg/dL — AB (ref 70–99)
Potassium: 4 mEq/L (ref 3.7–5.3)
Sodium: 139 mEq/L (ref 137–147)

## 2014-08-02 LAB — TROPONIN I: Troponin I: <0.3 ng/mL (ref <0.30)

## 2014-08-02 MED ORDER — CHLORHEXIDINE GLUCONATE 0.12 % MT SOLN
15.0000 mL | Freq: Two times a day (BID) | OROMUCOSAL | Status: DC
  Administered 2014-08-02 – 2014-08-04 (×3): 15 mL via OROMUCOSAL
  Filled 2014-08-02 (×6): qty 15

## 2014-08-02 MED ORDER — ONDANSETRON HCL 4 MG/2ML IJ SOLN: 4.0000 mg | Freq: Four times a day (QID) | INTRAMUSCULAR | Status: DC | PRN

## 2014-08-02 MED ORDER — HYDROMORPHONE HCL 1 MG/ML IJ SOLN
1.0000 mg | Freq: Once | INTRAMUSCULAR | Status: AC
  Administered 2014-08-02: 1 mg via INTRAMUSCULAR
  Filled 2014-08-02: qty 1

## 2014-08-02 MED ORDER — DEXAMETHASONE SODIUM PHOSPHATE 10 MG/ML IJ SOLN
10.0000 mg | Freq: Once | INTRAMUSCULAR | Status: AC
  Administered 2014-08-03: 10 mg via INTRAVENOUS
  Filled 2014-08-02: qty 1

## 2014-08-02 MED ORDER — CLOPIDOGREL BISULFATE 75 MG PO TABS
75.0000 mg | ORAL_TABLET | Freq: Every day | ORAL | Status: DC
Start: 2014-08-03 — End: 2014-08-04
  Administered 2014-08-03 – 2014-08-04 (×2): 75 mg via ORAL
  Filled 2014-08-02 (×2): qty 1

## 2014-08-02 MED ORDER — NITROGLYCERIN 2 % TD OINT
1.0000 [in_us] | TOPICAL_OINTMENT | Freq: Once | TRANSDERMAL | Status: AC
  Administered 2014-08-02: 1 [in_us] via TOPICAL
  Filled 2014-08-02: qty 30

## 2014-08-02 MED ORDER — CLONIDINE HCL 0.1 MG PO TABS
0.0500 mg | ORAL_TABLET | Freq: Two times a day (BID) | ORAL | Status: DC
  Administered 2014-08-03 – 2014-08-04 (×4): 0.05 mg via ORAL
  Filled 2014-08-02 (×6): qty 0.5

## 2014-08-02 MED ORDER — ROSUVASTATIN CALCIUM 10 MG PO TABS
10.0000 mg | ORAL_TABLET | Freq: Every day | ORAL | Status: DC
  Administered 2014-08-02 – 2014-08-03 (×2): 10 mg via ORAL
  Filled 2014-08-02 (×4): qty 1

## 2014-08-02 MED ORDER — ONDANSETRON HCL 4 MG PO TABS: 4.0000 mg | ORAL_TABLET | Freq: Four times a day (QID) | ORAL | Status: DC | PRN

## 2014-08-02 MED ORDER — ALPRAZOLAM 0.25 MG PO TABS
0.2500 mg | ORAL_TABLET | Freq: Two times a day (BID) | ORAL | Status: DC | PRN
  Administered 2014-08-02: 0.25 mg via ORAL
  Filled 2014-08-02: qty 1

## 2014-08-02 MED ORDER — PANTOPRAZOLE SODIUM 40 MG PO TBEC
40.0000 mg | DELAYED_RELEASE_TABLET | Freq: Every day | ORAL | Status: DC
  Administered 2014-08-03 – 2014-08-04 (×3): 40 mg via ORAL
  Filled 2014-08-02 (×3): qty 1

## 2014-08-02 MED ORDER — AMLODIPINE BESYLATE 10 MG PO TABS
10.0000 mg | ORAL_TABLET | Freq: Every day | ORAL | Status: DC
  Administered 2014-08-03 – 2014-08-04 (×2): 10 mg via ORAL
  Filled 2014-08-02 (×2): qty 1

## 2014-08-02 MED ORDER — MORPHINE SULFATE 2 MG/ML IJ SOLN
2.0000 mg | INTRAMUSCULAR | Status: DC | PRN
  Filled 2014-08-02: qty 1

## 2014-08-02 MED ORDER — ENSURE COMPLETE PO LIQD
237.0000 mL | Freq: Two times a day (BID) | ORAL | Status: DC
  Filled 2014-08-02 (×4): qty 237

## 2014-08-02 MED ORDER — MECLIZINE HCL 25 MG PO TABS
25.0000 mg | ORAL_TABLET | Freq: Two times a day (BID) | ORAL | Status: DC | PRN
  Filled 2014-08-02: qty 1

## 2014-08-02 MED ORDER — TRAMADOL HCL 50 MG PO TABS
50.0000 mg | ORAL_TABLET | Freq: Four times a day (QID) | ORAL | Status: DC | PRN
  Administered 2014-08-02 – 2014-08-03 (×3): 50 mg via ORAL
  Filled 2014-08-02 (×3): qty 1

## 2014-08-02 MED ORDER — AMLODIPINE-OLMESARTAN 10-40 MG PO TABS: 1.0000 | ORAL_TABLET | Freq: Every day | ORAL | Status: DC

## 2014-08-02 MED ORDER — ACETAMINOPHEN 325 MG PO TABS
650.0000 mg | ORAL_TABLET | Freq: Four times a day (QID) | ORAL | Status: DC | PRN
  Administered 2014-08-03: 650 mg via ORAL
  Filled 2014-08-02: qty 2

## 2014-08-02 MED ORDER — SENNA 8.6 MG PO TABS
1.0000 | ORAL_TABLET | Freq: Two times a day (BID) | ORAL | Status: DC
  Administered 2014-08-03 – 2014-08-04 (×3): 8.6 mg via ORAL
  Filled 2014-08-02 (×4): qty 1

## 2014-08-02 MED ORDER — HEPARIN SODIUM (PORCINE) 5000 UNIT/ML IJ SOLN
5000.0000 [IU] | Freq: Three times a day (TID) | INTRAMUSCULAR | Status: DC
  Administered 2014-08-03 – 2014-08-04 (×3): 5000 [IU] via SUBCUTANEOUS
  Filled 2014-08-02 (×9): qty 1

## 2014-08-02 MED ORDER — IOHEXOL 350 MG/ML SOLN
100.0000 mL | Freq: Once | INTRAVENOUS | Status: AC | PRN
  Administered 2014-08-02: 100 mL via INTRAVENOUS

## 2014-08-02 MED ORDER — GABAPENTIN 100 MG PO CAPS
100.0000 mg | ORAL_CAPSULE | Freq: Three times a day (TID) | ORAL | Status: DC
  Administered 2014-08-02 – 2014-08-04 (×4): 100 mg via ORAL
  Filled 2014-08-02 (×9): qty 1

## 2014-08-02 MED ORDER — OLMESARTAN MEDOXOMIL 40 MG PO TABS
40.0000 mg | ORAL_TABLET | Freq: Every day | ORAL | Status: DC
  Administered 2014-08-03 – 2014-08-04 (×2): 40 mg via ORAL
  Filled 2014-08-02 (×2): qty 1

## 2014-08-02 MED ORDER — PREDNISOLONE ACETATE 1 % OP SUSP
2.0000 [drp] | Freq: Every day | OPHTHALMIC | Status: DC
  Administered 2014-08-02 – 2014-08-03 (×2): 2 [drp] via OPHTHALMIC
  Filled 2014-08-02 (×2): qty 1

## 2014-08-02 MED ORDER — CETYLPYRIDINIUM CHLORIDE 0.05 % MT LIQD: 7.0000 mL | Freq: Two times a day (BID) | OROMUCOSAL | Status: DC

## 2014-08-02 MED ORDER — GI COCKTAIL ~~LOC~~
30.0000 mL | Freq: Four times a day (QID) | ORAL | Status: DC | PRN
  Filled 2014-08-02: qty 30

## 2014-08-02 MED ORDER — POLYETHYLENE GLYCOL 3350 17 G PO PACK
17.0000 g | PACK | Freq: Every day | ORAL | Status: DC | PRN
  Filled 2014-08-02: qty 1

## 2014-08-02 MED ORDER — RISAQUAD PO CAPS
2.0000 | ORAL_CAPSULE | Freq: Every day | ORAL | Status: DC
  Administered 2014-08-02 – 2014-08-04 (×3): 2 via ORAL
  Filled 2014-08-02 (×3): qty 2

## 2014-08-02 MED ORDER — CETIRIZINE HCL 5 MG/5ML PO SYRP
5.0000 mg | ORAL_SOLUTION | Freq: Every day | ORAL | Status: DC | PRN
  Filled 2014-08-02: qty 5

## 2014-08-02 MED ORDER — ASPIRIN EC 81 MG PO TBEC
81.0000 mg | DELAYED_RELEASE_TABLET | Freq: Every day | ORAL | Status: DC
  Administered 2014-08-02 – 2014-08-04 (×3): 81 mg via ORAL
  Filled 2014-08-02 (×3): qty 1

## 2014-08-02 MED ORDER — ALBUTEROL SULFATE (2.5 MG/3ML) 0.083% IN NEBU: 3.0000 mL | INHALATION_SOLUTION | RESPIRATORY_TRACT | Status: DC | PRN

## 2014-08-02 MED ORDER — ACETAMINOPHEN 650 MG RE SUPP: 650.0000 mg | Freq: Four times a day (QID) | RECTAL | Status: DC | PRN

## 2014-08-02 NOTE — ED Notes (Signed)
Pt reports knee pain x2 days, now radiates up and down leg. Pain 6/10. Ambulatory. Denies trauma or injury.

## 2014-08-02 NOTE — ED Provider Notes (Signed)
CSN: [id number]     Arrival date & time 08/02/14  1243 History   First MD Initiated Contact with Patient 08/02/14 1318     Chief Complaint  Patient presents with  . Knee Pain     (Consider location/radiation/quality/duration/timing/severity/associated sxs/prior Treatment) Patient is a 73 y.o. female presenting with leg pain.  Leg Pain Location:  Leg Time since incident:  2 days Injury: no   Leg location:  R leg Pain details:    Quality:  Aching   Radiates to:  Does not radiate   Severity:  Moderate   Onset quality:  Gradual   Timing:  Constant   Progression:  Worsening Chronicity:  Recurrent Foreign body present:  No foreign bodies Prior injury to area: prior claudication and arterial insufficiency sp stenting. Relieved by:  Nothing Worsened by:  Bearing weight, adduction, exercise and flexion Ineffective treatments:  None tried Associated symptoms: swelling (R sided) and tingling   Associated symptoms: no back pain, no fever and no numbness     Past Medical History  Diagnosis Date  . Asthma   . Reflux   . Hiatal hernia   . Peripheral vascular disease   . Hypertension   . Hyperlipidemia   . Iliac artery aneurysm   . Aortoiliac occlusive disease   . 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 Carotid artery occlusion   . Myocardial infarction 01/01/2000    Cardiac catheterization  . COPD (chronic obstructive pulmonary disease)   . Coronary artery disease   . Arnold-Chiari malformation 1998  . 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  . Gastroesophageal reflux disease    Past Surgical History  Procedure Laterality Date  . Eye surgery      Laser surgery for retinal hemorrhage  . Rotator cuff repair      Right  . Coronary artery bypass graft  01/01/2000  . Post coronary artery  bpg  01/05/2000    Right jugular sheath removed  . Carotid endarterectomy  03/29/2010    Left  CEA  . Pr vein bypass graft,aorto-fem-pop    . Arnold-chiari malformation repair  1998     Suboccipital craniectomy  . Corneal transplant      Right  . Appendectomy    . Cholecystectomy    . Abdominal hysterectomy     Family History  Problem Relation Age of Onset  . Heart disease Mother     Heart Disease before age 13  . Hypertension Mother   . Hyperlipidemia Mother   . Heart attack Mother   . Clotting disorder Mother   . Heart disease Father     Heart Disease before age 30  . Heart attack Father   . Heart disease Brother     Heart Disease before age 63  . Hyperlipidemia Brother   . Hypertension Brother   . Clotting disorder Brother   . Cerebral aneurysm Sister   . Asthma Sister   . Cerebral aneurysm Brother   . Cancer Brother   . Heart disease Brother   . Heart disease Brother   . Heart disease Brother    History  Substance Use Topics  . Smoking status: Former Smoker -- 1.00 packs/day for 30 years    Types: Cigarettes    Quit date: 08/13/2000  . Smokeless tobacco: Never Used  . Alcohol Use: No   OB History    No data available     Review of Systems  Constitutional: Negative for fever.  Musculoskeletal: Negative for back pain.  All other systems reviewed and are negative.     Allergies  Codeine; Oxycodone-acetaminophen; Risedronate; and Avelox  Home Medications   Prior to Admission medications   Medication Sig Start Date End Date Taking? Authorizing Provider  albuterol (PROVENTIL) (2.5 MG/3ML) 0.083% nebulizer solution Take 2.5 mg by nebulization every 4 (four) hours as needed for wheezing or shortness of breath (((PLAN C))).    Yes Historical Provider, MD  albuterol (VENTOLIN HFA) 108 (90 BASE) MCG/ACT inhaler Inhale 2 puffs into the lungs every 4 (four) hours as needed for wheezing or shortness of breath (((PLAN B))). 08/09/13  Yes Pollie Friar, MD  ALPRAZolam Duanne Moron) 0.25 MG tablet Take 0.25 mg by mouth 2 (two) times daily as needed for anxiety.  03/05/14  Yes Historical Provider, MD  amLODipine-olmesartan (AZOR) 10-40 MG per tablet Take  1 tablet by mouth daily.   Yes Historical Provider, MD  aspirin EC 81 MG tablet Take 81 mg by mouth daily.    Yes Historical Provider, MD  budesonide (PULMICORT) 0.25 MG/2ML nebulizer solution One twice daily with perforomist Patient taking differently: Take 0.25 mg by nebulization 2 (two) times daily as needed (For shortness of breath.).  11/17/12  Yes Tanda Rockers, MD  cetirizine (ZYRTEC) 1 MG/ML syrup Take 5 mg by mouth daily as needed (For allergies.).    Yes Historical Provider, MD  cloNIDine (CATAPRES) 0.1 MG tablet Take 0.05 mg by mouth 2 (two) times daily. Take 1 1/2 twice daily   Yes Historical Provider, MD  clopidogrel (PLAVIX) 75 MG tablet Take 1 tablet (75 mg total) by mouth daily. 04/02/12  Yes Conrad [place], MD  dexlansoprazole (DEXILANT) 60 MG capsule Take 1 capsule (60 mg total) by mouth daily before breakfast. 11/17/12  Yes Tanda Rockers, MD  meclizine (ANTIVERT) 25 MG tablet Take 25 mg by mouth 2 (two) times daily as needed for dizziness.  06/28/14  Yes Historical Provider, MD  nitroGLYCERIN (NITRODUR - DOSED IN MG/24 HR) 0.4 mg/hr patch Place 0.4 mg onto the skin daily.  02/01/14  Yes Historical Provider, MD  nitroGLYCERIN (NITROSTAT) 0.4 MG SL tablet Place 0.4 mg under the tongue continuous as needed for chest pain.    Yes Historical Provider, MD  prednisoLONE acetate (PRED FORTE) 1 % ophthalmic suspension Place 2 drops into the right eye at bedtime. Administer 2 drops to the right eye 2 (two) times a day. 02/26/14  Yes Historical Provider, MD  rosuvastatin (CRESTOR) 10 MG tablet Take 10 mg by mouth at bedtime.    Yes Historical Provider, MD  acidophilus (RISAQUAD) CAPS capsule Take 2 capsules by mouth daily. Patient not taking: Reported on 08/02/2014 04/06/13   Donne Hazel, MD  formoterol (PERFOROMIST) 20 MCG/2ML nebulizer solution Take 20 mcg by nebulization daily. Half dose per day.    Historical Provider, MD  predniSONE (DELTASONE) 20 MG tablet Take 3 tabs (60mg ) with breakfast  tomorrow and Tues, 2 tabs (40mg ) w/ breakfast Weds and Thurs, and 1 tab (20 mg) w/ breakfast Fri and Sat. Patient not taking: Reported on 08/02/2014 08/09/13   Pollie Friar, MD   BP 132/54 mmHg  Pulse 60  Temp(Src) 97.8 F (36.6 C) (Oral)  Resp 18  SpO2 93% Physical Exam  Constitutional: She is oriented to person, place, and time. She appears well-developed and well-nourished.  HENT:  Head: Normocephalic and atraumatic.  Right Ear: External ear normal.  Left Ear: External ear normal.  Eyes: Conjunctivae and EOM are normal. Pupils are equal,  round, and reactive to light.  Neck: Normal range of motion. Neck supple.  Cardiovascular: Normal rate, regular rhythm, normal heart sounds and intact distal pulses.   Pulmonary/Chest: Effort normal and breath sounds normal.  Abdominal: Soft. Bowel sounds are normal. There is no tenderness.  Musculoskeletal: Normal range of motion.       Right lower leg: She exhibits tenderness and swelling. She exhibits no edema.  Neurological: She is alert and oriented to person, place, and time.  Skin: Skin is warm and dry.  Vitals reviewed.   ED Course  Procedures (including critical care time) Labs Review Labs Reviewed  CBC WITH DIFFERENTIAL - Abnormal; Notable for the following:    RBC 3.85 (*)    Hemoglobin 11.0 (*)    HCT 33.4 (*)    All other components within normal limits  BASIC METABOLIC PANEL - Abnormal; Notable for the following:    Glucose, Bld 103 (*)    GFR calc non Af Amer 65 (*)    GFR calc Af Amer 76 (*)    All other components within normal limits  TROPONIN I    Imaging Review No results found.   EKG Interpretation   Date/Time:  Monday August 02 2014 15:25:21 EST Ventricular Rate:  57 PR Interval:  173 QRS Duration: 86 QT Interval:  449 QTC Calculation: 437 R Axis:   64 Text Interpretation:  Sinus rhythm Baseline wander in lead(s) II III aVF  No significant change since last tracing Confirmed by Debby Freiberg   [phone]) on 08/02/2014 3:36:30 PM      MDM   Final diagnoses:  Chest pain  Claudication    73 y.o. female with pertinent PMH of iliac artery aneurysm, claudication, prior stenting, CAD presents with recurrent R leg pain x 2 days.  No trauma, infectious symptoms.  Patient has dopplerable pulses that are equal in bilateral lower extremity. Limbs are of equal temperature bilaterally.  She does have increased swelling of the right side. DVT study of the right leg was unremarkable. Unfortunately, during the patient's stay she developed chest pain which she states is identical to prior MI. This resolved after brief period of time. She did have some remaining chest pressure however states that this is very mild.  Initial troponin and EKG unremarkable.  Pt care to Dr. Mingo Amber pending CT PE study.  Plan on admission.    I have reviewed all laboratory and imaging studies if ordered as above  1. Chest pain   2. Claudication         Debby Freiberg, MD 08/02/14 1655

## 2014-08-02 NOTE — ED Notes (Signed)
Patient c/o mid chest pain that radiated intot he back. Patient c/o nausea and was diaphoretic. Patient has a history of an MI. EKG completed and given to Dr. Colin Rhein.

## 2014-08-02 NOTE — H&P (Addendum)
[facility] History and Physical  [name] ION:[id number] DOB: [birth year] DOA: 08/02/2014  Referring physician: Dr Mingo Amber Gabriel Cirri PCP: [name], MD   Chief Complaint: RLE pain and CP  HPI: [name] is a 73 y.o. female  CP started today while getting LE dopplers. Associated w/ cold sweats, SOB. Feltl ike someone was sitting on her chest. Radiates to back. Relieved by nitro in ED. "feels like previous heart attack pain."  H/o Chronic HAs. Son recently w/ brain aneurysm and rupture. HAs are worsening. Constant. Present for 1 mo. 2 Brother and sister both w/ brain aneurysms. Associated w/ photophobia. Tylenol w/o relief. Improves w/ rest adn worse w/ light and exertion.   RLE doppler ordered by ED and was negative. Presenting w/ 2 days of Rknee and foot pain. Tingling sensation. Radiation up to hip. Denies back pain, lossof bowel or bladder function, falls, weakness. No trauma.    Review of Systems:  Constitutional:  No weight loss, night sweats, Fevers, chills, fatigue.  HEENT:  Per HPI Cardio-vascular:  Per HPI GI:  No heartburn, indigestion, abdominal pain, nausea, vomiting, diarrhea, change in bowel habits, loss of appetite  Resp:  No excess mucus, no productive cough, No non-productive cough, No coughing up of blood.No change in color of mucus.No wheezing.No chest wall deformity  Skin:  no rash or lesions.  GU:  no dysuria, change in color of urine, no urgency or frequency. No flank pain.  Musculoskeletal:  No joint pain or swelling. No decreased range of motion. No back pain.  Psych:  No change in mood or affect. No depression or anxiety. No memory loss.   Past Medical History  Diagnosis Date  . Asthma   . Reflux   . Hiatal hernia   . Peripheral vascular disease   . Hypertension   . Hyperlipidemia   . Iliac artery aneurysm   . Aortoiliac occlusive disease   . 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 Carotid artery occlusion   .  Myocardial infarction 01/01/2000    Cardiac catheterization  . COPD (chronic obstructive pulmonary disease)   . Coronary artery disease   . Arnold-Chiari malformation 1998  . 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  . Gastroesophageal reflux disease    Past Surgical History  Procedure Laterality Date  . Eye surgery      Laser surgery for retinal hemorrhage  . Rotator cuff repair      Right  . Coronary artery bypass graft  01/01/2000  . Post coronary artery  bpg  01/05/2000    Right jugular sheath removed  . Carotid endarterectomy  03/29/2010    Left  CEA  . Pr vein bypass graft,aorto-fem-pop    . Arnold-chiari malformation repair  1998    Suboccipital craniectomy  . Corneal transplant      Right  . Appendectomy    . Cholecystectomy    . Abdominal hysterectomy     Social History:  reports that she quit smoking about 13 years ago. Her smoking use included Cigarettes. She has a 30 pack-year smoking history. She has never used smokeless tobacco. She reports that she does not drink alcohol or use illicit drugs.  Allergies  Allergen Reactions  . Codeine Other (See Comments)    Dr. Terrence Dupont advised patient not to take this medication  . Oxycodone-Acetaminophen Other (See Comments)    Says it makes her feel weird  . Risedronate Other (See Comments)    Chest pain  . Avelox [Moxifloxacin Hcl  In Nacl] Palpitations    Family History  Problem Relation Age of Onset  . Heart disease Mother     Heart Disease before age 58  . Hypertension Mother   . Hyperlipidemia Mother   . Heart attack Mother   . Clotting disorder Mother   . Heart disease Father     Heart Disease before age 42  . Heart attack Father   . Heart disease Brother     Heart Disease before age 105  . Hyperlipidemia Brother   . Hypertension Brother   . Clotting disorder Brother   . Cerebral aneurysm Sister   . Asthma Sister   . Cerebral aneurysm Brother   . Cancer Brother   . Heart disease Brother   . Heart disease  Brother   . Heart disease Brother      Prior to Admission medications   Medication Sig Start Date End Date Taking? Authorizing Provider  albuterol (PROVENTIL) (2.5 MG/3ML) 0.083% nebulizer solution Take 2.5 mg by nebulization every 4 (four) hours as needed for wheezing or shortness of breath (((PLAN C))).    Yes Historical Provider, MD  albuterol (VENTOLIN HFA) 108 (90 BASE) MCG/ACT inhaler Inhale 2 puffs into the lungs every 4 (four) hours as needed for wheezing or shortness of breath (((PLAN B))). 08/09/13  Yes Pollie Friar, MD  ALPRAZolam Duanne Moron) 0.25 MG tablet Take 0.25 mg by mouth 2 (two) times daily as needed for anxiety.  03/05/14  Yes Historical Provider, MD  amLODipine-olmesartan (AZOR) 10-40 MG per tablet Take 1 tablet by mouth daily.   Yes Historical Provider, MD  aspirin EC 81 MG tablet Take 81 mg by mouth daily.    Yes Historical Provider, MD  budesonide (PULMICORT) 0.25 MG/2ML nebulizer solution One twice daily with perforomist Patient taking differently: Take 0.25 mg by nebulization 2 (two) times daily as needed (For shortness of breath.).  11/17/12  Yes Tanda Rockers, MD  cetirizine (ZYRTEC) 1 MG/ML syrup Take 5 mg by mouth daily as needed (For allergies.).    Yes Historical Provider, MD  cloNIDine (CATAPRES) 0.1 MG tablet Take 0.05 mg by mouth 2 (two) times daily. Take 1 1/2 twice daily   Yes Historical Provider, MD  clopidogrel (PLAVIX) 75 MG tablet Take 1 tablet (75 mg total) by mouth daily. 04/02/12  Yes Conrad [place], MD  dexlansoprazole (DEXILANT) 60 MG capsule Take 1 capsule (60 mg total) by mouth daily before breakfast. 11/17/12  Yes Tanda Rockers, MD  meclizine (ANTIVERT) 25 MG tablet Take 25 mg by mouth 2 (two) times daily as needed for dizziness.  06/28/14  Yes Historical Provider, MD  nitroGLYCERIN (NITRODUR - DOSED IN MG/24 HR) 0.4 mg/hr patch Place 0.4 mg onto the skin daily.  02/01/14  Yes Historical Provider, MD  nitroGLYCERIN (NITROSTAT) 0.4 MG SL tablet Place 0.4 mg  under the tongue continuous as needed for chest pain.    Yes Historical Provider, MD  prednisoLONE acetate (PRED FORTE) 1 % ophthalmic suspension Place 2 drops into the right eye at bedtime. Administer 2 drops to the right eye 2 (two) times a day. 02/26/14  Yes Historical Provider, MD  rosuvastatin (CRESTOR) 10 MG tablet Take 10 mg by mouth at bedtime.    Yes Historical Provider, MD  acidophilus (RISAQUAD) CAPS capsule Take 2 capsules by mouth daily. Patient not taking: Reported on 08/02/2014 04/06/13   Donne Hazel, MD  formoterol (PERFOROMIST) 20 MCG/2ML nebulizer solution Take 20 mcg by nebulization daily. Half dose  per day.    Historical Provider, MD  predniSONE (DELTASONE) 20 MG tablet Take 3 tabs (60mg ) with breakfast tomorrow and Tues, 2 tabs (40mg ) w/ breakfast Weds and Thurs, and 1 tab (20 mg) w/ breakfast Fri and Sat. Patient not taking: Reported on 08/02/2014 08/09/13   Pollie Friar, MD   Physical Exam: Filed Vitals:   08/02/14 1600 08/02/14 1800 08/02/14 1843 08/02/14 1922  BP: 132/54 127/62 127/62 139/55  Pulse: 60 61 62 62  Temp:    97.9 F (36.6 C)  TempSrc:    Oral  Resp: 18 15  16   SpO2: 93% 99% 92% 98%    Wt Readings from Last 3 Encounters:  12/09/13 76.204 kg (168 lb)  08/09/13 78.1 kg (172 lb 2.9 oz)  05/28/13 75.751 kg (167 lb)    General:  Appears calm and comfortable Eyes:  PERRL, normal lids, irises & conjunctiva ENT:  grossly normal hearing, lips & tongue Neck:  no LAD, masses or thyromegaly Cardiovascular:  RRR, no m/r/g. Trace - 1+ LE edema bilat Telemetry:  SR, no arrhythmias  Respiratory:  CTA bilaterally, no w/r/r. Normal respiratory effort. Abdomen:  soft, ntnd Skin:  no rash or induration seen on limited exam Musculoskeletal:  grossly normal tone BUE/BLE Psychiatric:  grossly normal mood and affect, speech fluent and appropriate Neurologic:  grossly non-focal.          Labs on Admission:  Basic Metabolic Panel:  Recent Labs Lab  08/02/14 1437  NA 139  K 4.0  CL 101  CO2 26  GLUCOSE 103*  BUN 12  CREATININE 0.87  CALCIUM 9.6   Liver Function Tests: No results for input(s): AST, ALT, ALKPHOS, BILITOT, PROT, ALBUMIN in the last 168 hours. No results for input(s): LIPASE, AMYLASE in the last 168 hours. No results for input(s): AMMONIA in the last 168 hours. CBC:  Recent Labs Lab 08/02/14 1437  WBC 8.6  NEUTROABS 4.4  HGB 11.0*  HCT 33.4*  MCV 86.8  PLT 232   Cardiac Enzymes:  Recent Labs Lab 08/02/14 1434  TROPONINI <0.30    BNP (last 3 results)  Recent Labs  08/07/13 1324  PROBNP 130.7*   CBG: No results for input(s): GLUCAP in the last 168 hours.  Radiological Exams on Admission: Ct Angio Chest Pe W/cm &/or Wo Cm  08/02/2014   CLINICAL DATA:  Chest pain radiating toward back  EXAM: CT ANGIOGRAPHY CHEST WITH CONTRAST  TECHNIQUE: Multidetector CT imaging of the chest was performed using the standard protocol during bolus administration of intravenous contrast. Multiplanar CT image reconstructions and MIPs were obtained to evaluate the vascular anatomy.  CONTRAST:  164mL OMNIPAQUE IOHEXOL 350 MG/ML SOLN  COMPARISON:  Chest radiograph August 07, 2013  FINDINGS: There is no appreciable pulmonary embolus. There is atherosclerotic change in the aorta, but there is no appreciable aneurysm or dissection. There is some thrombus in the periphery of the mid descending thoracic aorta. There is moderate calcification at the origins of the great vessels. No aneurysm is seen in these areas. The left and right common carotid arteries arise as a common trunk.  Patient is status post coronary artery bypass grafting. There is extensive coronary artery calcification.  There is scarring in each lung apex. There is a semisolid opacity medial segment of the right middle lobe measuring 9 x 7 mm. This opacity is seen on axial slices 40 and 41 series 7. There is a semi-solid nodular opacity in the superior segment right  lower lobe  which has a subtle central lucency measuring 6 x 7 mm. This opacity is seen on axial slice 48 series 7. There is patchy atelectasis in each lung base. There is benign pleural thickening in the posterior left base.  There is no appreciable thoracic adenopathy. The pericardium is not thickened.  In the visualized upper abdomen, there is atherosclerotic change in aorta. There is hepatic steatosis.  Thyroid appears unremarkable. There are no blastic or lytic bone lesions.  Review of the MIP images confirms the above findings.  IMPRESSION: No demonstrable pulmonary embolus. There is atherosclerotic change in aorta with some peripheral thrombus in the mid descending thoracic aorta. There is no aneurysm or dissection in the thoracic aorta, however.  There are semi-solid opacities on the right as noted above. The largest of these opacities measures 9 x 7 mm. It must be cautioned that early neoplasm can present in this manner. Followup noncontrast enhanced CT in 3 months to further assess advised. There also scattered areas of lung scarring. There is benign-appearing pleural thickening posterior left base.  Hepatic steatosis.   Electronically Signed   By: Lowella Grip M.D.   On: 08/02/2014 17:27    EKG: Independently reviewed. NSR, no change from previous, No ACS  Assessment/Plan Principal Problem:   Chest pain Active Problems:   COPD GOLD II   Peripheral vascular disease   Intractable headache   HLD (hyperlipidemia)   Essential hypertension   GERD (gastroesophageal reflux disease)   Leg pain, right  CP: H/o MI and CABG in 2001. Pt states this feels identical to previous MI. EKG unremarkable and Troponin negative. CTA chest negative for PE and dissection.  - Admit for obs - Tele - Cycle troponins - EKG in am - GI cocktail - continue ASA and Plavix  R leg pain: sounds likel neuropathic pain. No clear evidence of sciatica or other radicular pain. No h/o DM. Venous Dupplex on admission  was negative.  - Start Neurontin - A1c (last one of 6.0 in 2012) - PT/OT - Tylenol  HA: Etiology unclear. Migraines vs intracranial process. Multiple family members w/ brain aneurysms (son brothers, sister, son, etc). Previous scans from 1998 w/o evidence of aneurysm at that time. Chronic worsening HA for weeks concerning for intracranial process. Pt has not been to PCP for workup since onset. H/o ARnold Chiari Malformation repair in 1998. Pt w/ h/o extensive vascular disease (CABG, endarterectomy, ...). No focal deficits on neurologic exam - Decadron 10 - Consider Imitrex after CP R/O - Tylenol - zofran - MRI/MRA brain  HTN: stable - continue clonidine, Amlodipine, Olmesartan  HLD:  - continue crestor  GERD:  - continue PPI  Depression/Anxiety:  - Continue home Xanax   Code Status: FULL DVT Prophylaxis: Heparin Family Communication: Granddaughter Disposition Plan: pending R/o   [place] [email or web address] Password [email or web address]

## 2014-08-02 NOTE — ED Notes (Signed)
Patient's daughter gave the patient NTG 0,4 mg SL.

## 2014-08-02 NOTE — ED Provider Notes (Signed)
1630 - Care from Dr. Colin Rhein. 23F here with CP, began while here, alleviated with NTG. Awaiting PE study. PE study negative. Will admit for ACS r/o.  1. Chest pain   2. Claudication      Natalie Bucy, MD 08/02/14 1754

## 2014-08-02 NOTE — Progress Notes (Signed)
CSW spoke with patient at bedside.Daughters were at bedside. Patient stated that she lives at home in [place] with her son. Patient states that she came in because she had a "real bad" pain in her leg. Patient informed CSW that the night before she went to bed around 6:00pm and could barely walk.   Patient states that she is waiting for a CT scan. Daughter/ [employer] [phone]  Willette Brace [phone] ED CSW 08/02/2014 4:53 PM

## 2014-08-02 NOTE — Progress Notes (Signed)
*  PRELIMINARY RESULTS* Vascular Ultrasound Right lower extremity venous duplex has been completed.  Preliminary findings: no evidence of DVT.  Landry Mellow, RDMS, RVT  08/02/2014, 4:15 PM

## 2014-08-02 NOTE — ED Notes (Signed)
Patient states the CP went from 10/10 to 3/10.

## 2014-08-02 NOTE — ED Notes (Signed)
MD at bedside. 

## 2014-08-03 ENCOUNTER — Encounter (HOSPITAL_COMMUNITY): Payer: Self-pay | Admitting: Cardiovascular Disease

## 2014-08-03 ENCOUNTER — Encounter (HOSPITAL_COMMUNITY)
Admission: EM | Disposition: A | Discharge status: Home / Self Care | Payer: Self-pay | Source: Home / Self Care | Attending: Internal Medicine

## 2014-08-03 DIAGNOSIS — Z87891 Personal history of nicotine dependence: Secondary | ICD-10-CM | POA: Diagnosis not present

## 2014-08-03 DIAGNOSIS — G43909 Migraine, unspecified, not intractable, without status migrainosus: Secondary | ICD-10-CM | POA: Diagnosis present

## 2014-08-03 DIAGNOSIS — K219 Gastro-esophageal reflux disease without esophagitis: Secondary | ICD-10-CM | POA: Diagnosis present

## 2014-08-03 DIAGNOSIS — I25110 Atherosclerotic heart disease of native coronary artery with unstable angina pectoris: Secondary | ICD-10-CM | POA: Diagnosis present

## 2014-08-03 DIAGNOSIS — J45909 Unspecified asthma, uncomplicated: Secondary | ICD-10-CM | POA: Diagnosis present

## 2014-08-03 DIAGNOSIS — R079 Chest pain, unspecified: Secondary | ICD-10-CM | POA: Diagnosis present

## 2014-08-03 DIAGNOSIS — I10 Essential (primary) hypertension: Secondary | ICD-10-CM | POA: Diagnosis present

## 2014-08-03 DIAGNOSIS — Z951 Presence of aortocoronary bypass graft: Secondary | ICD-10-CM | POA: Diagnosis not present

## 2014-08-03 DIAGNOSIS — Z888 Allergy status to other drugs, medicaments and biological substances status: Secondary | ICD-10-CM | POA: Diagnosis not present

## 2014-08-03 DIAGNOSIS — Z7902 Long term (current) use of antithrombotics/antiplatelets: Secondary | ICD-10-CM | POA: Diagnosis not present

## 2014-08-03 DIAGNOSIS — I739 Peripheral vascular disease, unspecified: Secondary | ICD-10-CM | POA: Diagnosis present

## 2014-08-03 DIAGNOSIS — Z7951 Long term (current) use of inhaled steroids: Secondary | ICD-10-CM | POA: Diagnosis not present

## 2014-08-03 DIAGNOSIS — Z8489 Family history of other specified conditions: Secondary | ICD-10-CM | POA: Diagnosis not present

## 2014-08-03 DIAGNOSIS — Z7982 Long term (current) use of aspirin: Secondary | ICD-10-CM | POA: Diagnosis not present

## 2014-08-03 DIAGNOSIS — I25710 Atherosclerosis of autologous vein coronary artery bypass graft(s) with unstable angina pectoris: Secondary | ICD-10-CM | POA: Diagnosis present

## 2014-08-03 DIAGNOSIS — G629 Polyneuropathy, unspecified: Secondary | ICD-10-CM | POA: Diagnosis present

## 2014-08-03 DIAGNOSIS — I671 Cerebral aneurysm, nonruptured: Secondary | ICD-10-CM | POA: Diagnosis present

## 2014-08-03 DIAGNOSIS — Z823 Family history of stroke: Secondary | ICD-10-CM | POA: Diagnosis not present

## 2014-08-03 DIAGNOSIS — D500 Iron deficiency anemia secondary to blood loss (chronic): Secondary | ICD-10-CM | POA: Diagnosis present

## 2014-08-03 DIAGNOSIS — I2582 Chronic total occlusion of coronary artery: Secondary | ICD-10-CM | POA: Diagnosis present

## 2014-08-03 DIAGNOSIS — Z955 Presence of coronary angioplasty implant and graft: Secondary | ICD-10-CM | POA: Diagnosis not present

## 2014-08-03 DIAGNOSIS — Z8249 Family history of ischemic heart disease and other diseases of the circulatory system: Secondary | ICD-10-CM | POA: Diagnosis not present

## 2014-08-03 DIAGNOSIS — Z825 Family history of asthma and other chronic lower respiratory diseases: Secondary | ICD-10-CM | POA: Diagnosis not present

## 2014-08-03 DIAGNOSIS — I252 Old myocardial infarction: Secondary | ICD-10-CM | POA: Diagnosis not present

## 2014-08-03 DIAGNOSIS — J449 Chronic obstructive pulmonary disease, unspecified: Secondary | ICD-10-CM | POA: Diagnosis present

## 2014-08-03 DIAGNOSIS — Z885 Allergy status to narcotic agent status: Secondary | ICD-10-CM | POA: Diagnosis not present

## 2014-08-03 DIAGNOSIS — F418 Other specified anxiety disorders: Secondary | ICD-10-CM | POA: Diagnosis present

## 2014-08-03 DIAGNOSIS — E780 Pure hypercholesterolemia: Secondary | ICD-10-CM | POA: Diagnosis present

## 2014-08-03 HISTORY — PX: LEFT HEART CATHETERIZATION WITH CORONARY ANGIOGRAM: SHX5451

## 2014-08-03 LAB — CBC
HCT: 34.6 % — ABNORMAL LOW (ref 36.0–46.0)
HEMOGLOBIN: 10.9 g/dL — AB (ref 12.0–15.0)
MCH: 28 pg (ref 26.0–34.0)
MCHC: 31.5 g/dL (ref 30.0–36.0)
MCV: 88.9 fL (ref 78.0–100.0)
PLATELETS: 271 10*3/uL (ref 150–400)
RBC: 3.89 MIL/uL (ref 3.87–5.11)
RDW: 14 % (ref 11.5–15.5)
WBC: 6.1 10*3/uL (ref 4.0–10.5)

## 2014-08-03 LAB — BASIC METABOLIC PANEL
Anion gap: 11 (ref 5–15)
BUN: 15 mg/dL (ref 6–23)
CO2: 22 mmol/L (ref 19–32)
Calcium: 9.1 mg/dL (ref 8.4–10.5)
Chloride: 106 mEq/L (ref 96–112)
Creatinine, Ser: 1.09 mg/dL (ref 0.50–1.10)
GFR, EST AFRICAN AMERICAN: 58 mL/min — AB (ref >90)
GFR, EST NON AFRICAN AMERICAN: 50 mL/min — AB (ref >90)
GLUCOSE: 154 mg/dL — AB (ref 70–99)
POTASSIUM: 4 mmol/L (ref 3.5–5.1)
Sodium: 139 mmol/L (ref 135–145)

## 2014-08-03 LAB — PROTIME-INR
INR: 1.08 (ref 0.00–1.49)
Prothrombin Time: 14.1 seconds (ref 11.6–15.2)

## 2014-08-03 LAB — TROPONIN I: Troponin I: <0.03 ng/mL (ref <0.031)

## 2014-08-03 SURGERY — LEFT HEART CATHETERIZATION WITH CORONARY ANGIOGRAM

## 2014-08-03 MED ORDER — FENTANYL CITRATE 0.05 MG/ML IJ SOLN
INTRAMUSCULAR | Status: AC
  Filled 2014-08-03: qty 2

## 2014-08-03 MED ORDER — MIDAZOLAM HCL 2 MG/2ML IJ SOLN
INTRAMUSCULAR | Status: AC
  Filled 2014-08-03: qty 2

## 2014-08-03 MED ORDER — SODIUM CHLORIDE 0.9 % IJ SOLN: 3.0000 mL | Freq: Two times a day (BID) | INTRAMUSCULAR | Status: DC

## 2014-08-03 MED ORDER — SODIUM CHLORIDE 0.9 % IV SOLN: INTRAVENOUS | Status: AC

## 2014-08-03 MED ORDER — SODIUM CHLORIDE 0.9 % IV SOLN
INTRAVENOUS | Status: DC
  Administered 2014-08-03: 14:00:00 via INTRAVENOUS

## 2014-08-03 MED ORDER — LIDOCAINE HCL (PF) 1 % IJ SOLN
INTRAMUSCULAR | Status: AC
  Filled 2014-08-03: qty 30

## 2014-08-03 MED ORDER — NITROGLYCERIN 1 MG/10 ML FOR IR/CATH LAB
INTRA_ARTERIAL | Status: AC
Start: 2014-08-03 — End: 2014-08-03
  Filled 2014-08-03: qty 10

## 2014-08-03 MED ORDER — SODIUM CHLORIDE 0.9 % IV SOLN: 250.0000 mL | INTRAVENOUS | Status: DC | PRN

## 2014-08-03 MED ORDER — SODIUM CHLORIDE 0.9 % IJ SOLN: 3.0000 mL | INTRAMUSCULAR | Status: DC | PRN

## 2014-08-03 MED ORDER — HEPARIN (PORCINE) IN NACL 2-0.9 UNIT/ML-% IJ SOLN
INTRAMUSCULAR | Status: AC
Start: 2014-08-03 — End: 2014-08-03
  Filled 2014-08-03: qty 1000

## 2014-08-03 NOTE — Progress Notes (Signed)
INITIAL NUTRITION ASSESSMENT  DOCUMENTATION CODES Per approved criteria  -Obesity Unspecified   INTERVENTION: - Once diet advanced, add Ensure Complete po BID, each supplement provides 350 kcal and 13 grams of protein  NUTRITION DIAGNOSIS: Inadequate oral intake related to poor appetite as evidenced by wt loss.   Goal: Pt to meet >/= 90% of their estimated nutrition needs   Monitor:  Weight trend, po intake, acceptance of supplements  Reason for Assessment: MST  73 y.o. female  Admitting Dx: Chest pain  ASSESSMENT: 73 y.o. female CP started today while getting LE dopplers. Associated w/ cold sweats, SOB. Feltl ike someone was sitting on her chest. Radiates to back. Relieved by nitro in ED. "feels like previous heart attack pain."  - Pt reports a poor appetite. She says that she just does not feel like eating. Pt ate 100% of breakfast this morning. She has had a 9-12 lb wt loss within the past year. Pt with no signs of fat or muscle wasting.   Labs: CBG's: 96-132  Height: Ht Readings from Last 1 Encounters:  08/02/14 5\' 2"  (1.575 m)    Weight: Wt Readings from Last 1 Encounters:  08/03/14 167 lb 15.9 oz (76.202 kg)    Ideal Body Weight: 52.4 kg  % Ideal Body Weight: 145%  Wt Readings from Last 10 Encounters:  08/03/14 167 lb 15.9 oz (76.202 kg)  08/02/14 168 lb (76.204 kg)  12/09/13 168 lb (76.204 kg)  08/09/13 172 lb 2.9 oz (78.1 kg)  05/28/13 167 lb (75.751 kg)  04/04/13 167 lb 8 oz (75.978 kg)  01/07/13 169 lb (76.658 kg)  12/24/12 171 lb 12.8 oz (77.928 kg)  12/02/12 173 lb 12.8 oz (78.835 kg)  11/17/12 173 lb 3.2 oz (78.563 kg)    Usual Body Weight: 177 lbs  % Usual Body Weight: 94%  BMI:  Body mass index is 30.72 kg/(m^2).  Estimated Nutritional Needs: Kcal: 1600-1800 Protein: 100-110 g Fluid: 1.8 L/day  Skin: Intact  Diet Order: Diet NPO time specified Except for: Sips with Meds  EDUCATION NEEDS: -Education needs  addressed   Intake/Output Summary (Last 24 hours) at 08/03/14 1251 Last data filed at 08/03/14 0531  Gross per 24 hour  Intake    360 ml  Output      0 ml  Net    360 ml    Last BM: prior to admission   Labs:   Recent Labs Lab 08/02/14 1437  NA 139  K 4.0  CL 101  CO2 26  BUN 12  CREATININE 0.87  CALCIUM 9.6  GLUCOSE 103*    CBG (last 3)  No results for input(s): GLUCAP in the last 72 hours.  Scheduled Meds: . acidophilus  2 capsule Oral Daily  . amLODipine  10 mg Oral Daily   And  . olmesartan  40 mg Oral Daily  . antiseptic oral rinse  7 mL Mouth Rinse q12n4p  . aspirin EC  81 mg Oral Daily  . chlorhexidine  15 mL Mouth Rinse BID  . cloNIDine  0.05 mg Oral BID  . clopidogrel  75 mg Oral Daily  . feeding supplement (ENSURE COMPLETE)  237 mL Oral BID BM  . gabapentin  100 mg Oral TID  . heparin  5,000 Units Subcutaneous 3 times per day  . pantoprazole  40 mg Oral Daily  . prednisoLONE acetate  2 drop Right Eye QHS  . rosuvastatin  10 mg Oral QHS  . senna  1 tablet Oral BID  .  sodium chloride  3 mL Intravenous Q12H    Continuous Infusions: . sodium chloride      Past Medical History  Diagnosis Date  . Asthma   . Reflux   . Hiatal hernia   . Peripheral vascular disease   . Hypertension   . Hyperlipidemia   . Iliac artery aneurysm   . Aortoiliac occlusive disease   . 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 Carotid artery occlusion   . Myocardial infarction 01/01/2000    Cardiac catheterization  . COPD (chronic obstructive pulmonary disease)   . Coronary artery disease   . Arnold-Chiari malformation 1998  . 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  . Gastroesophageal reflux disease     Past Surgical History  Procedure Laterality Date  . Eye surgery      Laser surgery for retinal hemorrhage  . Rotator cuff repair      Right  . Coronary artery bypass graft  01/01/2000  . Post coronary artery  bpg  01/05/2000    Right jugular sheath  removed  . Carotid endarterectomy  03/29/2010    Left  CEA  . Pr vein bypass graft,aorto-fem-pop    . Arnold-chiari malformation repair  1998    Suboccipital craniectomy  . Corneal transplant      Right  . Appendectomy    . Cholecystectomy    . Abdominal hysterectomy      Laurette Schimke MS, RD, LDN

## 2014-08-03 NOTE — Progress Notes (Signed)
Site area: rt groin Site Prior to Removal:  Level 0 Pressure Applied For: 20 minutes Manual:   Yes, sheath removed by LMurphy,RN Patient Status During Pull:  stable Post Pull Site:  Level 0 Post Pull Instructions Given:  yes Post Pull Pulses Present: yes Dressing Applied:  tegaderm Bedrest begins @ 6808 Comments: no complications

## 2014-08-03 NOTE — Progress Notes (Signed)
Right femoral arterial sheath removed by LMurphy,RN.

## 2014-08-03 NOTE — Progress Notes (Signed)
[facility]                                                                              Patient Demographics  [name], is a 73 y.o. female, DOB - Jan 11, 1941, [id number]  Admit date - 08/02/2014   Admitting Physician Waldemar Dickens, MD  Outpatient Primary MD for the patient is [name], MD  LOS - 1   Chief Complaint  Patient presents with  . Knee Pain      HPI on 08/02/2014 by Dr. Linna Darner [name] is a 72 y.o. female presented CP started today while getting LE dopplers. Associated w/ cold sweats, SOB. Feltl ike someone was sitting on her chest. Radiates to back. Relieved by nitro in ED. "feels like previous heart attack pain."  H/o Chronic HAs. Son recently w/ brain aneurysm and rupture. HAs are worsening. Constant. Present for 1 mo. 2 Brother and sister both w/ brain aneurysms. Associated w/ photophobia. Tylenol w/o relief. Improves w/ rest adn worse w/ light and exertion.   RLE doppler ordered by ED and was negative. Presenting w/ 2 days of Rknee and foot pain. Tingling sensation. Radiation up to hip. Denies back pain, lossof bowel or bladder function, falls, weakness. No trauma.   Assessment & Plan   Chest Pain/Unstable agina -Cardiology consulted and planning for catheterization today (transfer to [facility]) -Troponins negative times 3 -Patient currently chest pain free -CTA chest negative for PE or dissection -Continue plavix, ARB  Headache/Brain aneurysms -MRI brain: -IR, Dr. Estanislado Pandy consulted via phone and states patient should have angiography done, either in or outpatient -Will try to schedule pending the results of catheterization  Right leg pain -Likely neuropathy -Continue gabapentin -Venous doppler negative for DVT -PT/OT consulted  -Continue pain control  Hypertension -Stable, continue clonidine, amlodipine, olmesartan  Hyperlipidemia -Continue statin  GERD -Continue PPI  Anxiety/depression -Continue  Xanax  Code Status: Full  Family Communication: Daughter at bedside  Disposition Plan: Admitted  Time Spent in minutes   30 minutes  Procedures  None  Consults   Cardiology, Dr. Doylene Canard IR, Dr. Estanislado Pandy  DVT Prophylaxis  heparin  Lab Results  Component Value Date   PLT 271 08/03/2014    Medications  Scheduled Meds: . acidophilus  2 capsule Oral Daily  . amLODipine  10 mg Oral Daily   And  . olmesartan  40 mg Oral Daily  . antiseptic oral rinse  7 mL Mouth Rinse q12n4p  . aspirin EC  81 mg Oral Daily  . chlorhexidine  15 mL Mouth Rinse BID  . cloNIDine  0.05 mg Oral BID  . clopidogrel  75 mg Oral Daily  . feeding supplement (ENSURE COMPLETE)  237 mL Oral BID BM  . gabapentin  100 mg Oral TID  . heparin  5,000 Units Subcutaneous 3 times per day  . pantoprazole  40 mg Oral Daily  . prednisoLONE acetate  2 drop Right Eye QHS  . rosuvastatin  10 mg Oral QHS  . senna  1 tablet Oral BID  . sodium chloride  3 mL Intravenous Q12H   Continuous Infusions: . sodium chloride 100 mL/hr at 08/03/14 1408   PRN Meds:.sodium  chloride, acetaminophen **OR** acetaminophen, albuterol, ALPRAZolam, cetirizine HCl, gi cocktail, meclizine, ondansetron **OR** ondansetron (ZOFRAN) IV, polyethylene glycol, sodium chloride, traMADol  Antibiotics    Anti-infectives    None      Subjective:   Rosalio Loud seen and examined today. Patient no longer complains of pain.  States she is feeling better and would like to go home.  She states her right leg still hurts.  Denies chest pain or shortness of breath at this time.   Objective:   Filed Vitals:   08/03/14 0921 08/03/14 1031 08/03/14 1213 08/03/14 1319  BP: 135/52 153/60  124/44  Pulse:  74  62  Temp:  97.6 F (36.4 C)  98.2 F (36.8 C)  TempSrc:  Oral  Oral  Resp:  20  20  Height:      Weight:   76.202 kg (167 lb 15.9 oz)   SpO2:  98%  95%    Wt Readings from Last 3 Encounters:  08/03/14 76.202 kg (167 lb 15.9 oz)   08/02/14 76.204 kg (168 lb)  12/09/13 76.204 kg (168 lb)     Intake/Output Summary (Last 24 hours) at 08/03/14 1456 Last data filed at 08/03/14 0531  Gross per 24 hour  Intake    360 ml  Output      0 ml  Net    360 ml    Exam  General: Well developed, well nourished, NAD, appears stated age  68: NCAT, mucous membranes moist.   Cardiovascular: S1 S2 auscultated, 2/6 SEM, RRR  Respiratory: Clear to auscultation bilaterally with equal chest rise  Abdomen: Soft, nontender, nondistended, + bowel sounds  Extremities: warm dry without cyanosis clubbing or edema  Neuro: AAOx3,no focal deficits  Skin: Without rashes exudates or nodules  Psych: Normal affect and demeanor with intact judgement and insight  Data Review   Micro Results No results found for this or any previous visit (from the past 240 hour(s)).  Radiology Reports Ct Angio Chest Pe W/cm &/or Wo Cm  08/02/2014   CLINICAL DATA:  Chest pain radiating toward back  EXAM: CT ANGIOGRAPHY CHEST WITH CONTRAST  TECHNIQUE: Multidetector CT imaging of the chest was performed using the standard protocol during bolus administration of intravenous contrast. Multiplanar CT image reconstructions and MIPs were obtained to evaluate the vascular anatomy.  CONTRAST:  118mL OMNIPAQUE IOHEXOL 350 MG/ML SOLN  COMPARISON:  Chest radiograph August 07, 2013  FINDINGS: There is no appreciable pulmonary embolus. There is atherosclerotic change in the aorta, but there is no appreciable aneurysm or dissection. There is some thrombus in the periphery of the mid descending thoracic aorta. There is moderate calcification at the origins of the great vessels. No aneurysm is seen in these areas. The left and right common carotid arteries arise as a common trunk.  Patient is status post coronary artery bypass grafting. There is extensive coronary artery calcification.  There is scarring in each lung apex. There is a semisolid opacity medial segment of  the right middle lobe measuring 9 x 7 mm. This opacity is seen on axial slices 40 and 41 series 7. There is a semi-solid nodular opacity in the superior segment right lower lobe which has a subtle central lucency measuring 6 x 7 mm. This opacity is seen on axial slice 48 series 7. There is patchy atelectasis in each lung base. There is benign pleural thickening in the posterior left base.  There is no appreciable thoracic adenopathy. The pericardium is not thickened.  In the  visualized upper abdomen, there is atherosclerotic change in aorta. There is hepatic steatosis.  Thyroid appears unremarkable. There are no blastic or lytic bone lesions.  Review of the MIP images confirms the above findings.  IMPRESSION: No demonstrable pulmonary embolus. There is atherosclerotic change in aorta with some peripheral thrombus in the mid descending thoracic aorta. There is no aneurysm or dissection in the thoracic aorta, however.  There are semi-solid opacities on the right as noted above. The largest of these opacities measures 9 x 7 mm. It must be cautioned that early neoplasm can present in this manner. Followup noncontrast enhanced CT in 3 months to further assess advised. There also scattered areas of lung scarring. There is benign-appearing pleural thickening posterior left base.  Hepatic steatosis.   Electronically Signed   By: Lowella Grip M.D.   On: 08/02/2014 17:27   Mr [name] Head Wo Contrast  08/02/2014   CLINICAL DATA:  Chest pain today while getting lower extremity Doppler, symptoms similar to prior heart attack. Chronic headaches, son with ruptured aneurysm.  EXAM: MRI HEAD WITHOUT CONTRAST  MRA HEAD WITHOUT CONTRAST  TECHNIQUE: Multiplanar, multiecho pulse sequences of the brain and surrounding structures were obtained without intravenous contrast. Angiographic images of the head were obtained using MRA technique without contrast.  COMPARISON:  MRI and MRA of the head June 12, 2013  FINDINGS: MRI HEAD  FINDINGS  No reduced diffusion to suggest acute ischemia. Less than 5 supra and infratentorial punctate foci of susceptibility artifact were present on prior imaging. No midline shift or mass effect. The ventricles and sulci are normal for patient's age. A few scattered subcentimeter supratentorial white matter T2 hyperintensities are less than expected for age.  No abnormal extra-axial fluid collections. RIGHT ocular lens implant. Paranasal sinusitis with sphenoid air-fluid level, new. Under pneumatized mastoid air cells. No abnormal sellar expansion. No cerebellar tonsillar ectopia. Patient is edentulous.  MRA HEAD FINDINGS  Anterior circulation: Normal flow related enhancement of the included cervical, petrous, cavernous and supra clinoid internal carotid arteries. 2 mm outpouching along inferior aspect of RIGHT cavernous carotid. Patent anterior communicating artery. Normal flow related enhancement of the anterior and middle cerebral arteries, including more distal segments. 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 Supernumerary anterior cerebral artery arising from RIGHT A1 2 junction.  No large vessel occlusion, hemodynamically significant stenosis, abnormal luminal irregularity.  Posterior circulation: LEFT vertebral artery is dominant. Basilar artery is patent, with normal flow related enhancement of the main branch vessels. Normal proximal flow related enhancement of the posterior cerebral arteries. Similar attenuated distal LEFT posterior cerebral artery flow related enhancement. 2 mm focal outpouching RIGHT P2 3 junction.  No large vessel occlusion, hemodynamically significant stenosis, abnormal luminal irregularity.  IMPRESSION: MRI HEAD: No acute intracranial process ; normal noncontrast MRI of the brain for age.  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 In addition, 2 mm superiorly directed RIGHT middle cerebral artery bifurcation.   Similarly attenuated distal LEFT posterior cerebral artery. Probable 2 mm aneurysm RIGHT P2 3 junction. For constellation of findings, CT angiogram of the head may be provide greater detail considering diminutive size of the aneurysms.   Electronically Signed   By: Elon Alas   On: 08/02/2014 22:34   [name] Contrast  08/02/2014   CLINICAL DATA:  Chest pain today while getting lower extremity Doppler, symptoms similar to prior heart attack. Chronic headaches, son with ruptured aneurysm.  EXAM: MRI HEAD WITHOUT CONTRAST  MRA HEAD WITHOUT CONTRAST  TECHNIQUE: Multiplanar, multiecho pulse sequences of the brain and surrounding structures were obtained without intravenous contrast. Angiographic images of the head were obtained using MRA technique without contrast.  COMPARISON:  MRI and MRA of the head June 12, 2013  FINDINGS: MRI HEAD FINDINGS  No reduced diffusion to suggest acute ischemia. Less than 5 supra and infratentorial punctate foci of susceptibility artifact were present on prior imaging. No midline shift or mass effect. The ventricles and sulci are normal for patient's age. A few scattered subcentimeter supratentorial white matter T2 hyperintensities are less than expected for age.  No abnormal extra-axial fluid collections. RIGHT ocular lens implant. Paranasal sinusitis with sphenoid air-fluid level, new. Under pneumatized mastoid air cells. No abnormal sellar expansion. No cerebellar tonsillar ectopia. Patient is edentulous.  MRA HEAD FINDINGS  Anterior circulation: Normal flow related enhancement of the included cervical, petrous, cavernous and supra clinoid internal carotid arteries. 2 mm outpouching along inferior aspect of RIGHT cavernous carotid. Patent anterior communicating artery. Normal flow related enhancement of the anterior and middle cerebral arteries, including more distal segments. 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 Supernumerary  anterior cerebral artery arising from RIGHT A1 2 junction.  No large vessel occlusion, hemodynamically significant stenosis, abnormal luminal irregularity.  Posterior circulation: LEFT vertebral artery is dominant. Basilar artery is patent, with normal flow related enhancement of the main branch vessels. Normal proximal flow related enhancement of the posterior cerebral arteries. Similar attenuated distal LEFT posterior cerebral artery flow related enhancement. 2 mm focal outpouching RIGHT P2 3 junction.  No large vessel occlusion, hemodynamically significant stenosis, abnormal luminal irregularity.  IMPRESSION: MRI HEAD: No acute intracranial process ; normal noncontrast MRI of the brain for age.  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 In addition, 2 mm superiorly directed RIGHT middle cerebral artery bifurcation.  Similarly attenuated distal LEFT posterior cerebral artery. Probable 2 mm aneurysm RIGHT P2 3 junction. For constellation of findings, CT angiogram of the head may be provide greater detail considering diminutive size of the aneurysms.   Electronically Signed   By: Elon Alas   On: 08/02/2014 22:34    CBC  Recent Labs Lab 08/02/14 1437 08/03/14 0430  WBC 8.6 6.1  HGB 11.0* 10.9*  HCT 33.4* 34.6*  PLT 232 271  MCV 86.8 88.9  MCH 28.6 28.0  MCHC 32.9 31.5  RDW 13.7 14.0  LYMPHSABS 3.1  --   MONOABS 0.7  --   EOSABS 0.3  --   BASOSABS 0.0  --     Chemistries   Recent Labs Lab 08/02/14 1437 08/03/14 0430  NA 139 139  K 4.0 4.0  CL 101 106  CO2 26 22  GLUCOSE 103* 154*  BUN 12 15  CREATININE 0.87 1.09  CALCIUM 9.6 9.1   ------------------------------------------------------------------------------------------------------------------ estimated creatinine clearance is 45.2 mL/min (by C-G formula based on Cr of  1.09). ------------------------------------------------------------------------------------------------------------------ No results for input(s): HGBA1C in the last 72 hours. ------------------------------------------------------------------------------------------------------------------ No results for input(s): CHOL, HDL, LDLCALC, TRIG, CHOLHDL, LDLDIRECT in the last 72 hours. ------------------------------------------------------------------------------------------------------------------ No results for input(s): TSH, T4TOTAL, T3FREE, THYROIDAB in the last 72 hours.  Invalid input(s): FREET3 ------------------------------------------------------------------------------------------------------------------ No results for input(s): VITAMINB12, FOLATE, FERRITIN, TIBC, IRON, RETICCTPCT in the last 72 hours.  Coagulation profile  Recent Labs Lab 08/03/14 1100  INR 1.08    No results for input(s): DDIMER in the last 72 hours.  Cardiac Enzymes  Recent Labs Lab 08/02/14 1434 08/02/14  2249 08/03/14 0430  TROPONINI <0.30 <0.30 <0.03   ------------------------------------------------------------------------------------------------------------------ Invalid input(s): POCBNP    Patches Mcdonnell D.O. on 08/03/2014 at 2:56 PM  Between 7am to 7pm - Pager - [phone]  After 7pm go to [email or web address] - password [email or web address]  And look for the night coverage person covering for me after hours  [facility] Office  [phone]

## 2014-08-03 NOTE — CV Procedure (Addendum)
PROCEDURE:  Left heart catheterization with selective coronary angiography, left ventriculogram.  CLINICAL HISTORY:  This is a 73 year old female with CAD and CABG has unstable angina with sweating spell.  The risks, benefits, and details of the procedure were explained to the patient.  The patient verbalized understanding and wanted to proceed.  Informed written consent was obtained.  PROCEDURE TECHNIQUE:  The patient was approached from the right femoral artery using a 5 French short sheath.  Left coronary angiography was done using a Judkins L4 guide catheter.  Right coronary angiography was done using a Judkins R4 guide catheter and LIMA catheter and Bypass grafts study was done using LIMA catheter. Left ventriculography was done using a pigtail catheter.    CONTRAST:  Total of 95 cc.  COMPLICATIONS:  None.  At the end of the procedure a manual pressure device was used for hemostasis.    HEMODYNAMICS:  Aortic pressure was 144/56; LV pressure was 162/2; LVEDP 18.  There was 10 mm gradient between the left ventricle and aorta.    ANGIOGRAM/CORONARY ARTERIOGRAM:   The left main coronary artery is unremarkable.  The left anterior descending artery has long proximal stent with 50 to 60 % stenosis then 60-70 % narrowing near insertion of LIMA with distal competitive flow. Diagonal 1 has total occlusion of osteal to proximal short stent.  The left circumflex artery is without significant disease. Ramus and OM are supplying lateral wall of the heart and are unremarkable..  The right coronary artery is dominant and has diffuse mild disease through out.  SVG to diagonal is totally occluded near origin.  SVG to RCA has near total occlusion post origin followed by total occlusion post small proximal fusiform segment of the graft.  LIMA to LAD was widely patent.  LEFT VENTRICULOGRAM:  Left ventricular angiogram was done in the 30 RAO projection and revealed normal left ventricular wall motion and  systolic function with an estimated ejection fraction of 75 %.  LVEDP was 18 mmHg.  IMPRESSION OF HEART CATHETERIZATION:   1. Normal left main coronary artery. 2. Moderate to severe disease of native left anterior descending artery and its branches. 3. Normal left circumflex artery and its branches. 4. Mild diffuse disease of dominant right coronary artery. 5.  Total occlusion of SVG to diagonal vessel near origin. 6.  Total occlusion of SVG to RCA near origin and proximally. 7.  Patent LIMA to LAD. 8.  Normal left ventricular systolic function.  LVEDP 18 mmHg.  Ejection fraction 75 %.  RECOMMENDATION:   Medical treatment and life style modification. May undergo carotid angiography.

## 2014-08-03 NOTE — Consult Note (Signed)
Referring Physician:Dr. David Bouska/ Dr. David J Merrell  [name] is an 73 y.o. female.                       Chief Complaint: Chest pain  HPI: 73 year old female with chest pain starting in between the shoulder blade followed by heaviness on chest and profuse sweating similar to her previous heart attack. EKG-Sinus rhythm. Past medical history significant for coronary artery disease, status post PTCA and stenting to proximal and mid LAD and diagonal 1 in the past, followed by aggressive restenosis, requiring CABG x3. In May 2001, she had LIMA to LAD, saphenous vein graft to diagonal 1 and saphenous vein graft to distal RCA, hypertension, hypercholesteremia, peripheral vascular disease, status post bilateral common iliac stenting and left external iliac stenting, history of bronchial asthma, history of tobacco abuse and GERD. Also had right leg pain and h/o brain aneurysms with carotid angiogram study by Dr. Deveshwar soon.  Past Medical History  Diagnosis Date  . Asthma   . Reflux   . Hiatal hernia   . Peripheral vascular disease   . Hypertension   . Hyperlipidemia   . Iliac artery aneurysm   . Aortoiliac occlusive disease   . 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 Carotid artery occlusion   . Myocardial infarction 01/01/2000    Cardiac catheterization  . COPD (chronic obstructive pulmonary disease)   . Coronary artery disease   . Arnold-Chiari malformation 1998  . 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  . Gastroesophageal reflux disease       Past Surgical History  Procedure Laterality Date  . Eye surgery      Laser surgery for retinal hemorrhage  . Rotator cuff repair      Right  . Coronary artery bypass graft  01/01/2000  . Post coronary artery  bpg  01/05/2000    Right jugular sheath removed  . Carotid endarterectomy  03/29/2010    Left  CEA  . Pr vein bypass graft,aorto-fem-pop    . Arnold-chiari malformation repair  1998    Suboccipital  craniectomy  . Corneal transplant      Right  . Appendectomy    . Cholecystectomy    . Abdominal hysterectomy      Family History  Problem Relation Age of Onset  . Heart disease Mother     Heart Disease before age 60  . Hypertension Mother   . Hyperlipidemia Mother   . Heart attack Mother   . Clotting disorder Mother   . Heart disease Father     Heart Disease before age 60  . Heart attack Father   . Heart disease Brother     Heart Disease before age 60  . Hyperlipidemia Brother   . Hypertension Brother   . Clotting disorder Brother   . Cerebral aneurysm Sister   . Asthma Sister   . Cerebral aneurysm Brother   . Cancer Brother   . Heart disease Brother   . Heart disease Brother   . Heart disease Brother    Social History:  reports that she quit smoking about 13 years ago. Her smoking use included Cigarettes. She has a 30 pack-year smoking history. She has never used smokeless tobacco. She reports that she does not drink alcohol or use illicit drugs.  Allergies:  Allergies  Allergen Reactions  . Codeine Other (See Comments)    Dr. Harwani advised patient not to take this medication  . Oxycodone-Acetaminophen   Other (See Comments)    Says it makes her feel weird  . Risedronate Other (See Comments)    Chest pain  . Avelox [Moxifloxacin Hcl In Nacl] Palpitations    Medications Prior to Admission  Medication Sig Dispense Refill  . albuterol (PROVENTIL) (2.5 MG/3ML) 0.083% nebulizer solution Take 2.5 mg by nebulization every 4 (four) hours as needed for wheezing or shortness of breath (((PLAN C))).     . albuterol (VENTOLIN HFA) 108 (90 BASE) MCG/ACT inhaler Inhale 2 puffs into the lungs every 4 (four) hours as needed for wheezing or shortness of breath (((PLAN B))). 1 Inhaler 11  . ALPRAZolam (XANAX) 0.25 MG tablet Take 0.25 mg by mouth 2 (two) times daily as needed for anxiety.     . amLODipine-olmesartan (AZOR) 10-40 MG per tablet Take 1 tablet by mouth daily.    .  aspirin EC 81 MG tablet Take 81 mg by mouth daily.     . budesonide (PULMICORT) 0.25 MG/2ML nebulizer solution One twice daily with perforomist (Patient taking differently: Take 0.25 mg by nebulization 2 (two) times daily as needed (For shortness of breath.). ) 120 mL 12  . cetirizine (ZYRTEC) 1 MG/ML syrup Take 5 mg by mouth daily as needed (For allergies.).     . cloNIDine (CATAPRES) 0.1 MG tablet Take 0.05 mg by mouth 2 (two) times daily. Take 1 1/2 twice daily    . clopidogrel (PLAVIX) 75 MG tablet Take 1 tablet (75 mg total) by mouth daily. 90 tablet 3  . dexlansoprazole (DEXILANT) 60 MG capsule Take 1 capsule (60 mg total) by mouth daily before breakfast. 30 capsule 11  . meclizine (ANTIVERT) 25 MG tablet Take 25 mg by mouth 2 (two) times daily as needed for dizziness.   1  . nitroGLYCERIN (NITRODUR - DOSED IN MG/24 HR) 0.4 mg/hr patch Place 0.4 mg onto the skin daily.     . nitroGLYCERIN (NITROSTAT) 0.4 MG SL tablet Place 0.4 mg under the tongue continuous as needed for chest pain.     . prednisoLONE acetate (PRED FORTE) 1 % ophthalmic suspension Place 2 drops into the right eye at bedtime. Administer 2 drops to the right eye 2 (two) times a day.    . rosuvastatin (CRESTOR) 10 MG tablet Take 10 mg by mouth at bedtime.     . acidophilus (RISAQUAD) CAPS capsule Take 2 capsules by mouth daily. (Patient not taking: Reported on 08/02/2014) 40 capsule 0  . formoterol (PERFOROMIST) 20 MCG/2ML nebulizer solution Take 20 mcg by nebulization daily. Half dose per day.    . [DISCONTINUED] predniSONE (DELTASONE) 20 MG tablet Take 3 tabs (60mg) with breakfast tomorrow and Tues, 2 tabs (40mg) w/ breakfast Weds and Thurs, and 1 tab (20 mg) w/ breakfast Fri and Sat. (Patient not taking: Reported on 08/02/2014) 12 tablet 0    Results for orders placed or performed during the hospital encounter of 08/02/14 (from the past 48 hour(s))  Troponin I     Status: None   Collection Time: 08/02/14  2:34 PM  Result  Value Ref Range   Troponin I <0.30 <0.30 ng/mL    Comment:        Due to the release kinetics of cTnI, a negative result within the first hours of the onset of symptoms does not rule out myocardial infarction with certainty. If myocardial infarction is still suspected, repeat the test at appropriate intervals.   CBC with Differential     Status: Abnormal   Collection Time: 08/02/14    2:37 PM  Result Value Ref Range   WBC 8.6 4.0 - 10.5 K/uL   RBC 3.85 (L) 3.87 - 5.11 MIL/uL   Hemoglobin 11.0 (L) 12.0 - 15.0 g/dL   HCT 33.4 (L) 36.0 - 46.0 %   MCV 86.8 78.0 - 100.0 fL   MCH 28.6 26.0 - 34.0 pg   MCHC 32.9 30.0 - 36.0 g/dL   RDW 13.7 11.5 - 15.5 %   Platelets 232 150 - 400 K/uL   Neutrophils Relative % 51 43 - 77 %   Neutro Abs 4.4 1.7 - 7.7 K/uL   Lymphocytes Relative 37 12 - 46 %   Lymphs Abs 3.1 0.7 - 4.0 K/uL   Monocytes Relative 8 3 - 12 %   Monocytes Absolute 0.7 0.1 - 1.0 K/uL   Eosinophils Relative 4 0 - 5 %   Eosinophils Absolute 0.3 0.0 - 0.7 K/uL   Basophils Relative 0 0 - 1 %   Basophils Absolute 0.0 0.0 - 0.1 K/uL  Basic metabolic panel     Status: Abnormal   Collection Time: 08/02/14  2:37 PM  Result Value Ref Range   Sodium 139 137 - 147 mEq/L   Potassium 4.0 3.7 - 5.3 mEq/L   Chloride 101 96 - 112 mEq/L   CO2 26 19 - 32 mEq/L   Glucose, Bld 103 (H) 70 - 99 mg/dL   BUN 12 6 - 23 mg/dL   Creatinine, Ser 0.87 0.50 - 1.10 mg/dL   Calcium 9.6 8.4 - 10.5 mg/dL   GFR calc non Af Amer 65 (L) >90 mL/min   GFR calc Af Amer 76 (L) >90 mL/min    Comment: (NOTE) The eGFR has been calculated using the CKD EPI equation. This calculation has not been validated in all clinical situations. eGFR's persistently <90 mL/min signify possible Chronic Kidney Disease.    Anion gap 12 5 - 15  Troponin I-serum (0, 3, 6 hours)     Status: None   Collection Time: 08/02/14 10:49 PM  Result Value Ref Range   Troponin I <0.30 <0.30 ng/mL    Comment:        Due to the release  kinetics of cTnI, a negative result within the first hours of the onset of symptoms does not rule out myocardial infarction with certainty. If myocardial infarction is still suspected, repeat the test at appropriate intervals.   Troponin I-serum (0, 3, 6 hours)     Status: None   Collection Time: 08/03/14  4:30 AM  Result Value Ref Range   Troponin I <0.03 <0.031 ng/mL    Comment:        NO INDICATION OF MYOCARDIAL INJURY. Please note change in reference range.    Ct Angio Chest Pe W/cm &/or Wo Cm  08/02/2014   CLINICAL DATA:  Chest pain radiating toward back  EXAM: CT ANGIOGRAPHY CHEST WITH CONTRAST  TECHNIQUE: Multidetector CT imaging of the chest was performed using the standard protocol during bolus administration of intravenous contrast. Multiplanar CT image reconstructions and MIPs were obtained to evaluate the vascular anatomy.  CONTRAST:  100mL OMNIPAQUE IOHEXOL 350 MG/ML SOLN  COMPARISON:  Chest radiograph August 07, 2013  FINDINGS: There is no appreciable pulmonary embolus. There is atherosclerotic change in the aorta, but there is no appreciable aneurysm or dissection. There is some thrombus in the periphery of the mid descending thoracic aorta. There is moderate calcification at the origins of the great vessels. No aneurysm is seen in these areas.   The left and right common carotid arteries arise as a common trunk.  Patient is status post coronary artery bypass grafting. There is extensive coronary artery calcification.  There is scarring in each lung apex. There is a semisolid opacity medial segment of the right middle lobe measuring 9 x 7 mm. This opacity is seen on axial slices 40 and 41 series 7. There is a semi-solid nodular opacity in the superior segment right lower lobe which has a subtle central lucency measuring 6 x 7 mm. This opacity is seen on axial slice 48 series 7. There is patchy atelectasis in each lung base. There is benign pleural thickening in the posterior left  base.  There is no appreciable thoracic adenopathy. The pericardium is not thickened.  In the visualized upper abdomen, there is atherosclerotic change in aorta. There is hepatic steatosis.  Thyroid appears unremarkable. There are no blastic or lytic bone lesions.  Review of the MIP images confirms the above findings.  IMPRESSION: No demonstrable pulmonary embolus. There is atherosclerotic change in aorta with some peripheral thrombus in the mid descending thoracic aorta. There is no aneurysm or dissection in the thoracic aorta, however.  There are semi-solid opacities on the right as noted above. The largest of these opacities measures 9 x 7 mm. It must be cautioned that early neoplasm can present in this manner. Followup noncontrast enhanced CT in 3 months to further assess advised. There also scattered areas of lung scarring. There is benign-appearing pleural thickening posterior left base.  Hepatic steatosis.   Electronically Signed   By: William  Woodruff M.D.   On: 08/02/2014 17:27   Mr Mra Head Wo Contrast  08/02/2014   CLINICAL DATA:  Chest pain today while getting lower extremity Doppler, symptoms similar to prior heart attack. Chronic headaches, son with ruptured aneurysm.  EXAM: MRI HEAD WITHOUT CONTRAST  MRA HEAD WITHOUT CONTRAST  TECHNIQUE: Multiplanar, multiecho pulse sequences of the brain and surrounding structures were obtained without intravenous contrast. Angiographic images of the head were obtained using MRA technique without contrast.  COMPARISON:  MRI and MRA of the head June 12, 2013  FINDINGS: MRI HEAD FINDINGS  No reduced diffusion to suggest acute ischemia. Less than 5 supra and infratentorial punctate foci of susceptibility artifact were present on prior imaging. No midline shift or mass effect. The ventricles and sulci are normal for patient's age. A few scattered subcentimeter supratentorial white matter T2 hyperintensities are less than expected for age.  No abnormal extra-axial  fluid collections. RIGHT ocular lens implant. Paranasal sinusitis with sphenoid air-fluid level, new. Under pneumatized mastoid air cells. No abnormal sellar expansion. No cerebellar tonsillar ectopia. Patient is edentulous.  MRA HEAD FINDINGS  Anterior circulation: Normal flow related enhancement of the included cervical, petrous, cavernous and supra clinoid internal carotid arteries. 2 mm outpouching along inferior aspect of RIGHT cavernous carotid. Patent anterior communicating artery. Normal flow related enhancement of the anterior and middle cerebral arteries, including more distal segments. 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 Supernumerary anterior cerebral artery arising from RIGHT A1 2 junction.  No large vessel occlusion, hemodynamically significant stenosis, abnormal luminal irregularity.  Posterior circulation: LEFT vertebral artery is dominant. Basilar artery is patent, with normal flow related enhancement of the main branch vessels. Normal proximal flow related enhancement of the posterior cerebral arteries. Similar attenuated distal LEFT posterior cerebral artery flow related enhancement. 2 mm focal outpouching RIGHT P2 3 junction.  No large vessel occlusion, hemodynamically significant stenosis, abnormal luminal   irregularity.  IMPRESSION: MRI HEAD: No acute intracranial process ; normal noncontrast MRI of the brain for age.  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 In addition, 2 mm superiorly directed RIGHT middle cerebral artery bifurcation.  Similarly attenuated distal LEFT posterior cerebral artery. Probable 2 mm aneurysm RIGHT P2 3 junction. For constellation of findings, CT angiogram of the head may be provide greater detail considering diminutive size of the aneurysms.   Electronically Signed   By: Courtnay  Bloomer   On: 08/02/2014 22:34   [name] Contrast  08/02/2014   CLINICAL DATA:  Chest pain today while  getting lower extremity Doppler, symptoms similar to prior heart attack. Chronic headaches, son with ruptured aneurysm.  EXAM: MRI HEAD WITHOUT CONTRAST  MRA HEAD WITHOUT CONTRAST  TECHNIQUE: Multiplanar, multiecho pulse sequences of the brain and surrounding structures were obtained without intravenous contrast. Angiographic images of the head were obtained using MRA technique without contrast.  COMPARISON:  MRI and MRA of the head June 12, 2013  FINDINGS: MRI HEAD FINDINGS  No reduced diffusion to suggest acute ischemia. Less than 5 supra and infratentorial punctate foci of susceptibility artifact were present on prior imaging. No midline shift or mass effect. The ventricles and sulci are normal for patient's age. A few scattered subcentimeter supratentorial white matter T2 hyperintensities are less than expected for age.  No abnormal extra-axial fluid collections. RIGHT ocular lens implant. Paranasal sinusitis with sphenoid air-fluid level, new. Under pneumatized mastoid air cells. No abnormal sellar expansion. No cerebellar tonsillar ectopia. Patient is edentulous.  MRA HEAD FINDINGS  Anterior circulation: Normal flow related enhancement of the included cervical, petrous, cavernous and supra clinoid internal carotid arteries. 2 mm outpouching along inferior aspect of RIGHT cavernous carotid. Patent anterior communicating artery. Normal flow related enhancement of the anterior and middle cerebral arteries, including more distal segments. 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 Supernumerary anterior cerebral artery arising from RIGHT A1 2 junction.  No large vessel occlusion, hemodynamically significant stenosis, abnormal luminal irregularity.  Posterior circulation: LEFT vertebral artery is dominant. Basilar artery is patent, with normal flow related enhancement of the main branch vessels. Normal proximal flow related enhancement of the posterior cerebral arteries. Similar  attenuated distal LEFT posterior cerebral artery flow related enhancement. 2 mm focal outpouching RIGHT P2 3 junction.  No large vessel occlusion, hemodynamically significant stenosis, abnormal luminal irregularity.  IMPRESSION: MRI HEAD: No acute intracranial process ; normal noncontrast MRI of the brain for age.  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 In addition, 2 mm superiorly directed RIGHT middle cerebral artery bifurcation.  Similarly attenuated distal LEFT posterior cerebral artery. Probable 2 mm aneurysm RIGHT P2 3 junction. For constellation of findings, CT angiogram of the head may be provide greater detail considering diminutive size of the aneurysms.   Electronically Signed   By: Courtnay  Bloomer   On: 08/02/2014 22:34    Review Of Systems Constitutional: Denies fever, chills, diaphoresis, appetite change and fatigue.  HEENT:denies ear pain, congestion, sore throat, rhinorrhea, sneezing, mouth sores, trouble swallowing, neck pain, neck stiffness and tinnitus.  Cardiovascular: Positive chest pain, negative palpitations and leg swelling. No orthopnea, no PND Gastrointestinal: Denies nausea, vomiting, abdominal pain, diarrhea, constipation,blood in stool and abdominal distention.  Genitourinary: Denies dysuria, urgency, frequency, hematuria, flank pain and difficulty urinating.  Musculoskeletal: Positive myalgias, back pain, joint swelling, arthralgias and gait problem. Right groin pain. Skin: Denies pallor, rash and wound.    Neurological: Denies dizziness, syncope, weakness, lightheadedness, numbness and headaches.  Hematological: Denies adenopathy. Easy bruising, personal or family bleeding history  Psychiatric/Behavioral: Denies suicidal ideation, mood changes, confusion, nervousness, sleep disturbance and agitation  Blood pressure 135/52, pulse 59, temperature 98.1 F (36.7 C), temperature source Oral, resp. rate 20, height 5' 2" (1.575 m),  weight 77.3 kg (170 lb 6.7 oz), SpO2 99 %.  General: Appears to be in no distress, well nourished;cooperative with exam, family (son) at bedside. Eye: pupils equal, round and reactive; sclera anicteric; normal conjunctiva  Nose/throat: oropharynx clear, moist mucous membranes, pink gums  Neck: supple Lungs/Chest wall: Clear to auscultation bilaterally.  Heart: normal rate and regular rhythm; III/VI systolic murmurs Pulses: radial pulses are 2+ and symmetric. 1 + dorsalis pedis pulses. 2 + femoral pulses  Abdomen: Normal fullness, no rebound, guarding, or rigidity; normal bowel sounds.  Skin: warm, dry, intact, normal turgor, no rashes  Extremities: no peripheral edema, clubbing, or cyanosis,. + spider veins lower legs. Neurologic: A&O X3, CN II - XII are grossly intact. 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 Patient prefers cardiac cath and understood procedure, risks and alternatives.  Pam Vanalstine S, MD  08/03/2014, 9:27 AM     

## 2014-08-03 NOTE — Interval H&P Note (Signed)
History and Physical Interval Note:  08/03/2014 4:54 PM  [name]  has presented today for surgery, with the diagnosis of cp  The various methods of treatment have been discussed with the patient and family. After consideration of risks, benefits and other options for treatment, the patient has consented to  Procedure(s): LEFT HEART CATHETERIZATION WITH CORONARY ANGIOGRAM (N/A) as a surgical intervention .  The patient's history has been reviewed, patient examined, no change in status, stable for surgery.  I have reviewed the patient's chart and labs.  Questions were answered to the patient's satisfaction.     [name]

## 2014-08-03 NOTE — Progress Notes (Signed)
UR completed 

## 2014-08-03 NOTE — H&P (View-Only) (Signed)
Referring Physician:Dr. [name] Dr. Waldemar Dickens  [name] is an 73 y.o. female.                       Chief Complaint: Chest pain  HPI: 73 year old female with chest pain starting in between the shoulder blade followed by heaviness on chest and profuse sweating similar to her previous heart attack. EKG-Sinus rhythm. Past medical history significant for coronary artery disease, status post PTCA and stenting to proximal and mid LAD and diagonal 1 in the past, followed by aggressive restenosis, requiring CABG x3. In May 2001, she had LIMA to LAD, saphenous vein graft to diagonal 1 and saphenous vein graft to distal RCA, hypertension, hypercholesteremia, peripheral vascular disease, status post bilateral common iliac stenting and left external iliac stenting, history of bronchial asthma, history of tobacco abuse and GERD. Also had right leg pain and h/o brain aneurysms with carotid angiogram study by Dr. Estanislado Pandy soon.  Past Medical History  Diagnosis Date  . Asthma   . Reflux   . Hiatal hernia   . Peripheral vascular disease   . Hypertension   . Hyperlipidemia   . Iliac artery aneurysm   . Aortoiliac occlusive disease   . 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 Carotid artery occlusion   . Myocardial infarction 01/01/2000    Cardiac catheterization  . COPD (chronic obstructive pulmonary disease)   . Coronary artery disease   . Arnold-Chiari malformation 1998  . 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  . Gastroesophageal reflux disease       Past Surgical History  Procedure Laterality Date  . Eye surgery      Laser surgery for retinal hemorrhage  . Rotator cuff repair      Right  . Coronary artery bypass graft  01/01/2000  . Post coronary artery  bpg  01/05/2000    Right jugular sheath removed  . Carotid endarterectomy  03/29/2010    Left  CEA  . Pr vein bypass graft,aorto-fem-pop    . Arnold-chiari malformation repair  1998    Suboccipital  craniectomy  . Corneal transplant      Right  . Appendectomy    . Cholecystectomy    . Abdominal hysterectomy      Family History  Problem Relation Age of Onset  . Heart disease Mother     Heart Disease before age 75  . Hypertension Mother   . Hyperlipidemia Mother   . Heart attack Mother   . Clotting disorder Mother   . Heart disease Father     Heart Disease before age 33  . Heart attack Father   . Heart disease Brother     Heart Disease before age 67  . Hyperlipidemia Brother   . Hypertension Brother   . Clotting disorder Brother   . Cerebral aneurysm Sister   . Asthma Sister   . Cerebral aneurysm Brother   . Cancer Brother   . Heart disease Brother   . Heart disease Brother   . Heart disease Brother    Social History:  reports that she quit smoking about 13 years ago. Her smoking use included Cigarettes. She has a 30 pack-year smoking history. She has never used smokeless tobacco. She reports that she does not drink alcohol or use illicit drugs.  Allergies:  Allergies  Allergen Reactions  . Codeine Other (See Comments)    Dr. Terrence Dupont advised patient not to take this medication  . Oxycodone-Acetaminophen  Other (See Comments)    Says it makes her feel weird  . Risedronate Other (See Comments)    Chest pain  . Avelox [Moxifloxacin Hcl In Nacl] Palpitations    Medications Prior to Admission  Medication Sig Dispense Refill  . albuterol (PROVENTIL) (2.5 MG/3ML) 0.083% nebulizer solution Take 2.5 mg by nebulization every 4 (four) hours as needed for wheezing or shortness of breath (((PLAN C))).     . albuterol (VENTOLIN HFA) 108 (90 BASE) MCG/ACT inhaler Inhale 2 puffs into the lungs every 4 (four) hours as needed for wheezing or shortness of breath (((PLAN B))). 1 Inhaler 11  . ALPRAZolam (XANAX) 0.25 MG tablet Take 0.25 mg by mouth 2 (two) times daily as needed for anxiety.     Marland Kitchen amLODipine-olmesartan (AZOR) 10-40 MG per tablet Take 1 tablet by mouth daily.    Marland Kitchen  aspirin EC 81 MG tablet Take 81 mg by mouth daily.     . budesonide (PULMICORT) 0.25 MG/2ML nebulizer solution One twice daily with perforomist (Patient taking differently: Take 0.25 mg by nebulization 2 (two) times daily as needed (For shortness of breath.). ) 120 mL 12  . cetirizine (ZYRTEC) 1 MG/ML syrup Take 5 mg by mouth daily as needed (For allergies.).     Marland Kitchen cloNIDine (CATAPRES) 0.1 MG tablet Take 0.05 mg by mouth 2 (two) times daily. Take 1 1/2 twice daily    . clopidogrel (PLAVIX) 75 MG tablet Take 1 tablet (75 mg total) by mouth daily. 90 tablet 3  . dexlansoprazole (DEXILANT) 60 MG capsule Take 1 capsule (60 mg total) by mouth daily before breakfast. 30 capsule 11  . meclizine (ANTIVERT) 25 MG tablet Take 25 mg by mouth 2 (two) times daily as needed for dizziness.   1  . nitroGLYCERIN (NITRODUR - DOSED IN MG/24 HR) 0.4 mg/hr patch Place 0.4 mg onto the skin daily.     . nitroGLYCERIN (NITROSTAT) 0.4 MG SL tablet Place 0.4 mg under the tongue continuous as needed for chest pain.     . prednisoLONE acetate (PRED FORTE) 1 % ophthalmic suspension Place 2 drops into the right eye at bedtime. Administer 2 drops to the right eye 2 (two) times a day.    . rosuvastatin (CRESTOR) 10 MG tablet Take 10 mg by mouth at bedtime.     Marland Kitchen acidophilus (RISAQUAD) CAPS capsule Take 2 capsules by mouth daily. (Patient not taking: Reported on 08/02/2014) 40 capsule 0  . formoterol (PERFOROMIST) 20 MCG/2ML nebulizer solution Take 20 mcg by nebulization daily. Half dose per day.    . [DISCONTINUED] predniSONE (DELTASONE) 20 MG tablet Take 3 tabs (73m) with breakfast tomorrow and Tues, 2 tabs (458m w/ breakfast Weds and Thurs, and 1 tab (20 mg) w/ breakfast Fri and Sat. (Patient not taking: Reported on 08/02/2014) 12 tablet 0    Results for orders placed or performed during the hospital encounter of 08/02/14 (from the past 48 hour(s))  Troponin I     Status: None   Collection Time: 08/02/14  2:34 PM  Result  Value Ref Range   Troponin I <0.30 <0.30 ng/mL    Comment:        Due to the release kinetics of cTnI, a negative result within the first hours of the onset of symptoms does not rule out myocardial infarction with certainty. If myocardial infarction is still suspected, repeat the test at appropriate intervals.   CBC with Differential     Status: Abnormal   Collection Time: 08/02/14  2:37 PM  Result Value Ref Range   WBC 8.6 4.0 - 10.5 K/uL   RBC 3.85 (L) 3.87 - 5.11 MIL/uL   Hemoglobin 11.0 (L) 12.0 - 15.0 g/dL   HCT 33.4 (L) 36.0 - 46.0 %   MCV 86.8 78.0 - 100.0 fL   MCH 28.6 26.0 - 34.0 pg   MCHC 32.9 30.0 - 36.0 g/dL   RDW 13.7 11.5 - 15.5 %   Platelets 232 150 - 400 K/uL   Neutrophils Relative % 51 43 - 77 %   Neutro Abs 4.4 1.7 - 7.7 K/uL   Lymphocytes Relative 37 12 - 46 %   Lymphs Abs 3.1 0.7 - 4.0 K/uL   Monocytes Relative 8 3 - 12 %   Monocytes Absolute 0.7 0.1 - 1.0 K/uL   Eosinophils Relative 4 0 - 5 %   Eosinophils Absolute 0.3 0.0 - 0.7 K/uL   Basophils Relative 0 0 - 1 %   Basophils Absolute 0.0 0.0 - 0.1 K/uL  Basic metabolic panel     Status: Abnormal   Collection Time: 08/02/14  2:37 PM  Result Value Ref Range   Sodium 139 137 - 147 mEq/L   Potassium 4.0 3.7 - 5.3 mEq/L   Chloride 101 96 - 112 mEq/L   CO2 26 19 - 32 mEq/L   Glucose, Bld 103 (H) 70 - 99 mg/dL   BUN 12 6 - 23 mg/dL   Creatinine, Ser 0.87 0.50 - 1.10 mg/dL   Calcium 9.6 8.4 - 10.5 mg/dL   GFR calc non Af Amer 65 (L) >90 mL/min   GFR calc Af Amer 76 (L) >90 mL/min    Comment: (NOTE) The eGFR has been calculated using the CKD EPI equation. This calculation has not been validated in all clinical situations. eGFR's persistently <90 mL/min signify possible Chronic Kidney Disease.    Anion gap 12 5 - 15  Troponin I-serum (0, 3, 6 hours)     Status: None   Collection Time: 08/02/14 10:49 PM  Result Value Ref Range   Troponin I <0.30 <0.30 ng/mL    Comment:        Due to the release  kinetics of cTnI, a negative result within the first hours of the onset of symptoms does not rule out myocardial infarction with certainty. If myocardial infarction is still suspected, repeat the test at appropriate intervals.   Troponin I-serum (0, 3, 6 hours)     Status: None   Collection Time: 08/03/14  4:30 AM  Result Value Ref Range   Troponin I <0.03 <0.031 ng/mL    Comment:        NO INDICATION OF MYOCARDIAL INJURY. Please note change in reference range.    Ct Angio Chest Pe W/cm &/or Wo Cm  08/02/2014   CLINICAL DATA:  Chest pain radiating toward back  EXAM: CT ANGIOGRAPHY CHEST WITH CONTRAST  TECHNIQUE: Multidetector CT imaging of the chest was performed using the standard protocol during bolus administration of intravenous contrast. Multiplanar CT image reconstructions and MIPs were obtained to evaluate the vascular anatomy.  CONTRAST:  143m OMNIPAQUE IOHEXOL 350 MG/ML SOLN  COMPARISON:  Chest radiograph August 07, 2013  FINDINGS: There is no appreciable pulmonary embolus. There is atherosclerotic change in the aorta, but there is no appreciable aneurysm or dissection. There is some thrombus in the periphery of the mid descending thoracic aorta. There is moderate calcification at the origins of the great vessels. No aneurysm is seen in these areas.  The left and right common carotid arteries arise as a common trunk.  Patient is status post coronary artery bypass grafting. There is extensive coronary artery calcification.  There is scarring in each lung apex. There is a semisolid opacity medial segment of the right middle lobe measuring 9 x 7 mm. This opacity is seen on axial slices 40 and 41 series 7. There is a semi-solid nodular opacity in the superior segment right lower lobe which has a subtle central lucency measuring 6 x 7 mm. This opacity is seen on axial slice 48 series 7. There is patchy atelectasis in each lung base. There is benign pleural thickening in the posterior left  base.  There is no appreciable thoracic adenopathy. The pericardium is not thickened.  In the visualized upper abdomen, there is atherosclerotic change in aorta. There is hepatic steatosis.  Thyroid appears unremarkable. There are no blastic or lytic bone lesions.  Review of the MIP images confirms the above findings.  IMPRESSION: No demonstrable pulmonary embolus. There is atherosclerotic change in aorta with some peripheral thrombus in the mid descending thoracic aorta. There is no aneurysm or dissection in the thoracic aorta, however.  There are semi-solid opacities on the right as noted above. The largest of these opacities measures 9 x 7 mm. It must be cautioned that early neoplasm can present in this manner. Followup noncontrast enhanced CT in 3 months to further assess advised. There also scattered areas of lung scarring. There is benign-appearing pleural thickening posterior left base.  Hepatic steatosis.   Electronically Signed   By: Lowella Grip M.D.   On: 08/02/2014 17:27   Mr [name] Head Wo Contrast  08/02/2014   CLINICAL DATA:  Chest pain today while getting lower extremity Doppler, symptoms similar to prior heart attack. Chronic headaches, son with ruptured aneurysm.  EXAM: MRI HEAD WITHOUT CONTRAST  MRA HEAD WITHOUT CONTRAST  TECHNIQUE: Multiplanar, multiecho pulse sequences of the brain and surrounding structures were obtained without intravenous contrast. Angiographic images of the head were obtained using MRA technique without contrast.  COMPARISON:  MRI and MRA of the head June 12, 2013  FINDINGS: MRI HEAD FINDINGS  No reduced diffusion to suggest acute ischemia. Less than 5 supra and infratentorial punctate foci of susceptibility artifact were present on prior imaging. No midline shift or mass effect. The ventricles and sulci are normal for patient's age. A few scattered subcentimeter supratentorial white matter T2 hyperintensities are less than expected for age.  No abnormal extra-axial  fluid collections. RIGHT ocular lens implant. Paranasal sinusitis with sphenoid air-fluid level, new. Under pneumatized mastoid air cells. No abnormal sellar expansion. No cerebellar tonsillar ectopia. Patient is edentulous.  MRA HEAD FINDINGS  Anterior circulation: Normal flow related enhancement of the included cervical, petrous, cavernous and supra clinoid internal carotid arteries. 2 mm outpouching along inferior aspect of RIGHT cavernous carotid. Patent anterior communicating artery. Normal flow related enhancement of the anterior and middle cerebral arteries, including more distal segments. 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 Supernumerary anterior cerebral artery arising from RIGHT A1 2 junction.  No large vessel occlusion, hemodynamically significant stenosis, abnormal luminal irregularity.  Posterior circulation: LEFT vertebral artery is dominant. Basilar artery is patent, with normal flow related enhancement of the main branch vessels. Normal proximal flow related enhancement of the posterior cerebral arteries. Similar attenuated distal LEFT posterior cerebral artery flow related enhancement. 2 mm focal outpouching RIGHT P2 3 junction.  No large vessel occlusion, hemodynamically significant stenosis, abnormal luminal  irregularity.  IMPRESSION: MRI HEAD: No acute intracranial process ; normal noncontrast MRI of the brain for age.  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 In addition, 2 mm superiorly directed RIGHT middle cerebral artery bifurcation.  Similarly attenuated distal LEFT posterior cerebral artery. Probable 2 mm aneurysm RIGHT P2 3 junction. For constellation of findings, CT angiogram of the head may be provide greater detail considering diminutive size of the aneurysms.   Electronically Signed   By: Elon Alas   On: 08/02/2014 22:34   [name] Contrast  08/02/2014   CLINICAL DATA:  Chest pain today while  getting lower extremity Doppler, symptoms similar to prior heart attack. Chronic headaches, son with ruptured aneurysm.  EXAM: MRI HEAD WITHOUT CONTRAST  MRA HEAD WITHOUT CONTRAST  TECHNIQUE: Multiplanar, multiecho pulse sequences of the brain and surrounding structures were obtained without intravenous contrast. Angiographic images of the head were obtained using MRA technique without contrast.  COMPARISON:  MRI and MRA of the head June 12, 2013  FINDINGS: MRI HEAD FINDINGS  No reduced diffusion to suggest acute ischemia. Less than 5 supra and infratentorial punctate foci of susceptibility artifact were present on prior imaging. No midline shift or mass effect. The ventricles and sulci are normal for patient's age. A few scattered subcentimeter supratentorial white matter T2 hyperintensities are less than expected for age.  No abnormal extra-axial fluid collections. RIGHT ocular lens implant. Paranasal sinusitis with sphenoid air-fluid level, new. Under pneumatized mastoid air cells. No abnormal sellar expansion. No cerebellar tonsillar ectopia. Patient is edentulous.  MRA HEAD FINDINGS  Anterior circulation: Normal flow related enhancement of the included cervical, petrous, cavernous and supra clinoid internal carotid arteries. 2 mm outpouching along inferior aspect of RIGHT cavernous carotid. Patent anterior communicating artery. Normal flow related enhancement of the anterior and middle cerebral arteries, including more distal segments. 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 Supernumerary anterior cerebral artery arising from RIGHT A1 2 junction.  No large vessel occlusion, hemodynamically significant stenosis, abnormal luminal irregularity.  Posterior circulation: LEFT vertebral artery is dominant. Basilar artery is patent, with normal flow related enhancement of the main branch vessels. Normal proximal flow related enhancement of the posterior cerebral arteries. Similar  attenuated distal LEFT posterior cerebral artery flow related enhancement. 2 mm focal outpouching RIGHT P2 3 junction.  No large vessel occlusion, hemodynamically significant stenosis, abnormal luminal irregularity.  IMPRESSION: MRI HEAD: No acute intracranial process ; normal noncontrast MRI of the brain for age.  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 In addition, 2 mm superiorly directed RIGHT middle cerebral artery bifurcation.  Similarly attenuated distal LEFT posterior cerebral artery. Probable 2 mm aneurysm RIGHT P2 3 junction. For constellation of findings, CT angiogram of the head may be provide greater detail considering diminutive size of the aneurysms.   Electronically Signed   By: Elon Alas   On: 08/02/2014 22:34    Review Of Systems Constitutional: Denies fever, chills, diaphoresis, appetite change and fatigue.  HEENT:denies ear pain, congestion, sore throat, rhinorrhea, sneezing, mouth sores, trouble swallowing, neck pain, neck stiffness and tinnitus.  Cardiovascular: Positive chest pain, negative palpitations and leg swelling. No orthopnea, no PND Gastrointestinal: Denies nausea, vomiting, abdominal pain, diarrhea, constipation,blood in stool and abdominal distention.  Genitourinary: Denies dysuria, urgency, frequency, hematuria, flank pain and difficulty urinating.  Musculoskeletal: Positive myalgias, back pain, joint swelling, arthralgias and gait problem. Right groin pain. Skin: Denies pallor, rash and wound.  Neurological: Denies dizziness, syncope, weakness, lightheadedness, numbness and headaches.  Hematological: Denies adenopathy. Easy bruising, personal or family bleeding history  Psychiatric/Behavioral: Denies suicidal ideation, mood changes, confusion, nervousness, sleep disturbance and agitation  Blood pressure 135/52, pulse 59, temperature 98.1 F (36.7 C), temperature source Oral, resp. rate 20, height 5' 2" (1.575 m),  weight 77.3 kg (170 lb 6.7 oz), SpO2 99 %.  General: Appears to be in no distress, well nourished;cooperative with exam, family (son) at bedside. Eye: pupils equal, round and reactive; sclera anicteric; normal conjunctiva  Nose/throat: oropharynx clear, moist mucous membranes, pink gums  Neck: supple Lungs/Chest wall: Clear to auscultation bilaterally.  Heart: normal rate and regular rhythm; III/VI systolic murmurs Pulses: radial pulses are 2+ and symmetric. 1 + dorsalis pedis pulses. 2 + femoral pulses  Abdomen: Normal fullness, no rebound, guarding, or rigidity; normal bowel sounds.  Skin: warm, dry, intact, normal turgor, no rashes  Extremities: no peripheral edema, clubbing, or cyanosis,. + spider veins lower legs. Neurologic: A&O X3, CN II - XII are grossly intact. 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 Patient prefers cardiac cath and understood procedure, risks and alternatives.  Natalie Riddle, MD  08/03/2014, 9:27 AM

## 2014-08-04 ENCOUNTER — Other Ambulatory Visit: Payer: Self-pay | Admitting: Radiology

## 2014-08-04 DIAGNOSIS — I671 Cerebral aneurysm, nonruptured: Secondary | ICD-10-CM

## 2014-08-04 LAB — LIPID PANEL
CHOLESTEROL: 153 mg/dL (ref 0–200)
HDL: 49 mg/dL (ref >39)
LDL Cholesterol: 93 mg/dL (ref 0–99)
TRIGLYCERIDES: 57 mg/dL (ref <150)
Total CHOL/HDL Ratio: 3.1 RATIO
VLDL: 11 mg/dL (ref 0–40)

## 2014-08-04 LAB — BASIC METABOLIC PANEL
Anion gap: 9 (ref 5–15)
BUN: 17 mg/dL (ref 6–23)
CHLORIDE: 104 meq/L (ref 96–112)
CO2: 22 mmol/L (ref 19–32)
CREATININE: 0.94 mg/dL (ref 0.50–1.10)
Calcium: 8.9 mg/dL (ref 8.4–10.5)
GFR calc Af Amer: 69 mL/min — ABNORMAL LOW (ref >90)
GFR calc non Af Amer: 60 mL/min — ABNORMAL LOW (ref >90)
GLUCOSE: 133 mg/dL — AB (ref 70–99)
POTASSIUM: 4.6 mmol/L (ref 3.5–5.1)
Sodium: 135 mmol/L (ref 135–145)

## 2014-08-04 LAB — CBC
HCT: 30.5 % — ABNORMAL LOW (ref 36.0–46.0)
HEMOGLOBIN: 9.9 g/dL — AB (ref 12.0–15.0)
MCH: 27.7 pg (ref 26.0–34.0)
MCHC: 32.5 g/dL (ref 30.0–36.0)
MCV: 85.4 fL (ref 78.0–100.0)
Platelets: 245 10*3/uL (ref 150–400)
RBC: 3.57 MIL/uL — ABNORMAL LOW (ref 3.87–5.11)
RDW: 13.8 % (ref 11.5–15.5)
WBC: 11.1 10*3/uL — AB (ref 4.0–10.5)

## 2014-08-04 LAB — TSH: TSH: 0.566 u[IU]/mL (ref 0.350–4.500)

## 2014-08-04 MED ORDER — GABAPENTIN 100 MG PO CAPS: 100.0000 mg | ORAL_CAPSULE | Freq: Three times a day (TID) | ORAL | Status: DC

## 2014-08-04 NOTE — Discharge Summary (Signed)
Physician Discharge Summary  [name] BJS:[id number] DOB: 10-30-1940 DOA: 08/02/2014  PCP: [name], MD  Admit date: 08/02/2014 Discharge date: 08/04/2014  Time spent: 45 minutes  Recommendations for Outpatient Follow-up:  Patient will be discharged to home.  She is to followup with IR, Dr. Estanislado Pandy on 08/09/2014 at 7am.  She will also need to followup with her cardiologist, Dr.  Terrence Dupont.  She will need to followup with her primary care physician within 1-2 weeks of discharge.  Patient should continue taking her medications as prescribed.  She should follow a heart healthy and continue activity as tolerated.    Discharge Diagnoses:  Chest pain/unstable angina Headaches/brain aneurysms and right leg pain Hypertension Hyperlipidemia GERD Anxiety/depression  Discharge Condition: Stable  Diet recommendation: heart healthy  Filed Weights   08/02/14 1922 08/03/14 1213 08/04/14 0000  Weight: 77.3 kg (170 lb 6.7 oz) 76.202 kg (167 lb 15.9 oz) 76.7 kg (169 lb 1.5 oz)    History of present illness:  on 08/02/2014 by Dr. Linna Darner [name] is a 73 y.o. female presented CP started today while getting LE dopplers. Associated w/ cold sweats, SOB. Feltl ike someone was sitting on her chest. Radiates to back. Relieved by nitro in ED. "feels like previous heart attack pain." H/o Chronic HAs. Son recently w/ brain aneurysm and rupture. HAs are worsening. Constant. Present for 1 mo. 2 Brother and sister both w/ brain aneurysms. Associated w/ photophobia. Tylenol w/o relief. Improves w/ rest adn worse w/ light and exertion. RLE doppler ordered by ED and was negative. Presenting w/ 2 days of Rknee and foot pain. Tingling sensation. Radiation up to hip. Denies back pain, lossof bowel or bladder function, falls, weakness. No trauma.   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 EF 75% -Troponins negative times 3 -Patient currently chest pain free -CTA chest negative for PE or dissection -Continue plavix, ARB, aspirin -Patient will need follow-up with her cardiologist  Headache/Brain aneurysms -MRI brain: No acute intracranial process, 2 mm brain aneurysms -IR, Dr. 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 Doylene Canard IR, Dr. Estanislado Pandy  Discharge Exam: Filed Vitals:   08/04/14 0829  BP: 136/47  Pulse: 57  Temp: 97.7 F (36.5 C)  Resp: 18     General: Well developed, well nourished, NAD, appears stated age  HEENT: NCAT, mucous membranes moist.  Cardiovascular: S1 S2 auscultated, 2/6 SEM, Regular rate and rhythm.  Respiratory: Clear to auscultation bilaterally with equal chest rise  Abdomen: Soft, nontender, nondistended, + bowel sounds  Extremities: warm dry without cyanosis clubbing or edema  Neuro: AAOx3, no focal deficit  Psych: Normal affect and demeanor with intact judgement and insight  Discharge Instructions      Discharge Instructions    Discharge instructions    Complete by:  As directed   Patient will be discharged to home.  She is to followup with IR, Dr. Estanislado Pandy on 08/09/2014 at 7am.  She will also need to followup with her cardiologist, Dr.  Terrence Dupont.  She will need  to followup with her primary care physician within 1-2 weeks of discharge.  Patient should continue taking her medications as prescribed.  She  should follow a heart healthy and continue activity as tolerated.            Medication List    TAKE these medications        acidophilus Caps capsule  Take 2 capsules by mouth daily.     albuterol 108 (90 BASE) MCG/ACT inhaler  Commonly known as:  VENTOLIN HFA  Inhale 2 puffs into the lungs every 4 (four) hours as needed for wheezing or shortness of breath (((PLAN B))).     albuterol (2.5 MG/3ML) 0.083% nebulizer solution  Commonly known as:  PROVENTIL  Take 2.5 mg by nebulization every 4 (four) hours as needed for wheezing or shortness of breath (((PLAN C))).     ALPRAZolam 0.25 MG tablet  Commonly known as:  XANAX  Take 0.25 mg by mouth 2 (two) times daily as needed for anxiety.     amLODipine-olmesartan 10-40 MG per tablet  Commonly known as:  AZOR  Take 1 tablet by mouth daily.     aspirin EC 81 MG tablet  Take 81 mg by mouth daily.     budesonide 0.25 MG/2ML nebulizer solution  Commonly known as:  PULMICORT  One twice daily with perforomist     cetirizine 1 MG/ML syrup  Commonly known as:  ZYRTEC  Take 5 mg by mouth daily as needed (For allergies.).     cloNIDine 0.1 MG tablet  Commonly known as:  CATAPRES  Take 0.05 mg by mouth 2 (two) times daily. Take 1 1/2 twice daily     clopidogrel 75 MG tablet  Commonly known as:  PLAVIX  Take 1 tablet (75 mg total) by mouth daily.     dexlansoprazole 60 MG capsule  Commonly known as:  DEXILANT  Take 1 capsule (60 mg total) by mouth daily before breakfast.     gabapentin 100 MG capsule  Commonly known as:  NEURONTIN  Take 1 capsule (100 mg total) by mouth 3 (three) times daily.     meclizine 25 MG tablet  Commonly known as:  ANTIVERT  Take 25 mg by mouth 2 (two) times daily as needed for dizziness.     nitroGLYCERIN 0.4 mg/hr patch  Commonly known as:  NITRODUR - Dosed in mg/24 hr  Place 0.4 mg onto the skin daily.     nitroGLYCERIN 0.4 MG SL tablet  Commonly known as:  NITROSTAT  Place 0.4 mg under the  tongue continuous as needed for chest pain.     prednisoLONE acetate 1 % ophthalmic suspension  Commonly known as:  PRED FORTE  Place 2 drops into the right eye at bedtime. Administer 2 drops to the right eye 2 (two) times a day.     rosuvastatin 10 MG tablet  Commonly known as:  CRESTOR  Take 10 mg by mouth at bedtime.       Allergies  Allergen Reactions  . Codeine Other (See Comments)    Dr. Terrence Dupont advised patient not to take this medication  . Oxycodone-Acetaminophen Other (See Comments)    Says it makes her feel weird  . Risedronate Other (See Comments)    Chest pain  . Avelox [Moxifloxacin Hcl In Nacl] Palpitations   Follow-up Information    Follow up with BOUSKA,DAVID E, MD. Schedule an appointment as soon as possible for a visit in 2 weeks.   Specialty:  Family  Medicine   Contact information:   [id number] [place] [id number] [phone]       Follow up with Clent Demark, MD. Schedule an appointment as soon as possible for a visit in 1 month.   Specialty:  Cardiology   Contact information:   [place] [id number] [phone]       Follow up with Rob Hickman, MD On 08/09/2014.   Specialty:  Interventional Radiology   Why:  Please present to short stay at [facility] information:   [address] [place] [address] [phone]        The results of significant diagnostics from this hospitalization (including imaging, microbiology, ancillary and laboratory) are listed below for reference.    Significant Diagnostic Studies: Ct Angio Chest Pe W/cm &/or Wo Cm  08/02/2014   CLINICAL DATA:  Chest pain radiating toward back  EXAM: CT ANGIOGRAPHY CHEST WITH CONTRAST  TECHNIQUE: Multidetector CT imaging of the chest was performed using the standard protocol during bolus administration of intravenous contrast. Multiplanar CT image reconstructions and MIPs were obtained to evaluate the vascular  anatomy.  CONTRAST:  141mL OMNIPAQUE IOHEXOL 350 MG/ML SOLN  COMPARISON:  Chest radiograph August 07, 2013  FINDINGS: There is no appreciable pulmonary embolus. There is atherosclerotic change in the aorta, but there is no appreciable aneurysm or dissection. There is some thrombus in the periphery of the mid descending thoracic aorta. There is moderate calcification at the origins of the great vessels. No aneurysm is seen in these areas. The left and right common carotid arteries arise as a common trunk.  Patient is status post coronary artery bypass grafting. There is extensive coronary artery calcification.  There is scarring in each lung apex. There is a semisolid opacity medial segment of the right middle lobe measuring 9 x 7 mm. This opacity is seen on axial slices 40 and 41 series 7. There is a semi-solid nodular opacity in the superior segment right lower lobe which has a subtle central lucency measuring 6 x 7 mm. This opacity is seen on axial slice 48 series 7. There is patchy atelectasis in each lung base. There is benign pleural thickening in the posterior left base.  There is no appreciable thoracic adenopathy. The pericardium is not thickened.  In the visualized upper abdomen, there is atherosclerotic change in aorta. There is hepatic steatosis.  Thyroid appears unremarkable. There are no blastic or lytic bone lesions.  Review of the MIP images confirms the above findings.  IMPRESSION: No demonstrable pulmonary embolus. There is atherosclerotic change in aorta with some peripheral thrombus in the mid descending thoracic aorta. There is no aneurysm or dissection in the thoracic aorta, however.  There are semi-solid opacities on the right as noted above. The largest of these opacities measures 9 x 7 mm. It must be cautioned that early neoplasm can present in this manner. Followup noncontrast enhanced CT in 3 months to further assess advised. There also scattered areas of lung scarring. There is  benign-appearing pleural thickening posterior left base.  Hepatic steatosis.   Electronically Signed   By: Lowella Grip M.D.   On: 08/02/2014 17:27   Mr [name] Head Wo Contrast  08/02/2014   CLINICAL DATA:  Chest pain today while getting lower extremity Doppler, symptoms similar to prior heart attack. Chronic headaches, son with ruptured aneurysm.  EXAM: MRI HEAD WITHOUT CONTRAST  MRA HEAD WITHOUT CONTRAST  TECHNIQUE: Multiplanar, multiecho  pulse sequences of the brain and surrounding structures were obtained without intravenous contrast. Angiographic images of the head were obtained using MRA technique without contrast.  COMPARISON:  MRI and MRA of the head June 12, 2013  FINDINGS: MRI HEAD FINDINGS  No reduced diffusion to suggest acute ischemia. Less than 5 supra and infratentorial punctate foci of susceptibility artifact were present on prior imaging. No midline shift or mass effect. The ventricles and sulci are normal for patient's age. A few scattered subcentimeter supratentorial white matter T2 hyperintensities are less than expected for age.  No abnormal extra-axial fluid collections. RIGHT ocular lens implant. Paranasal sinusitis with sphenoid air-fluid level, new. Under pneumatized mastoid air cells. No abnormal sellar expansion. No cerebellar tonsillar ectopia. Patient is edentulous.  MRA HEAD FINDINGS  Anterior circulation: Normal flow related enhancement of the included cervical, petrous, cavernous and supra clinoid internal carotid arteries. 2 mm outpouching along inferior aspect of RIGHT cavernous carotid. Patent anterior communicating artery. Normal flow related enhancement of the anterior and middle cerebral arteries, including more distal segments. 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 Supernumerary anterior cerebral artery arising from RIGHT A1 2 junction.  No large vessel occlusion, hemodynamically significant stenosis, abnormal luminal irregularity.   Posterior circulation: LEFT vertebral artery is dominant. Basilar artery is patent, with normal flow related enhancement of the main branch vessels. Normal proximal flow related enhancement of the posterior cerebral arteries. Similar attenuated distal LEFT posterior cerebral artery flow related enhancement. 2 mm focal outpouching RIGHT P2 3 junction.  No large vessel occlusion, hemodynamically significant stenosis, abnormal luminal irregularity.  IMPRESSION: MRI HEAD: No acute intracranial process ; normal noncontrast MRI of the brain for age.  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 In addition, 2 mm superiorly directed RIGHT middle cerebral artery bifurcation.  Similarly attenuated distal LEFT posterior cerebral artery. Probable 2 mm aneurysm RIGHT P2 3 junction. For constellation of findings, CT angiogram of the head may be provide greater detail considering diminutive size of the aneurysms.   Electronically Signed   By: Elon Alas   On: 08/02/2014 22:34   [name] Contrast  08/02/2014   CLINICAL DATA:  Chest pain today while getting lower extremity Doppler, symptoms similar to prior heart attack. Chronic headaches, son with ruptured aneurysm.  EXAM: MRI HEAD WITHOUT CONTRAST  MRA HEAD WITHOUT CONTRAST  TECHNIQUE: Multiplanar, multiecho pulse sequences of the brain and surrounding structures were obtained without intravenous contrast. Angiographic images of the head were obtained using MRA technique without contrast.  COMPARISON:  MRI and MRA of the head June 12, 2013  FINDINGS: MRI HEAD FINDINGS  No reduced diffusion to suggest acute ischemia. Less than 5 supra and infratentorial punctate foci of susceptibility artifact were present on prior imaging. No midline shift or mass effect. The ventricles and sulci are normal for patient's age. A few scattered subcentimeter supratentorial white matter T2 hyperintensities are less than expected for age.  No abnormal  extra-axial fluid collections. RIGHT ocular lens implant. Paranasal sinusitis with sphenoid air-fluid level, new. Under pneumatized mastoid air cells. No abnormal sellar expansion. No cerebellar tonsillar ectopia. Patient is edentulous.  MRA HEAD FINDINGS  Anterior circulation: Normal flow related enhancement of the included cervical, petrous, cavernous and supra clinoid internal carotid arteries. 2 mm outpouching along inferior aspect of RIGHT cavernous carotid. Patent anterior communicating artery. Normal flow related enhancement of the anterior and middle cerebral arteries, including more distal segments. 2 mm superiorly directed aneurysm  RIGHT middle cerebral artery bifurcation. Supernumerary anterior cerebral artery arising from RIGHT A1 2 junction.  No large vessel occlusion, hemodynamically significant stenosis, abnormal luminal irregularity.  Posterior circulation: LEFT vertebral artery is dominant. Basilar artery is patent, with normal flow related enhancement of the main branch vessels. Normal proximal flow related enhancement of the posterior cerebral arteries. Similar attenuated distal LEFT posterior cerebral artery flow related enhancement. 2 mm focal outpouching RIGHT P2 3 junction.  No large vessel occlusion, hemodynamically significant stenosis, abnormal luminal irregularity.  IMPRESSION: MRI HEAD: No acute intracranial process ; normal noncontrast MRI of the brain for age.  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 In addition, 2 mm superiorly directed RIGHT middle cerebral artery bifurcation.  Similarly attenuated distal LEFT posterior cerebral artery. Probable 2 mm aneurysm RIGHT P2 3 junction. For constellation of findings, CT angiogram of the head may be provide greater detail considering diminutive size of the aneurysms.   Electronically Signed   By: Elon Alas   On: 08/02/2014 22:34    Microbiology: No results found for this or any previous visit  (from the past 240 hour(s)).   Labs: Basic Metabolic Panel:  Recent Labs Lab 08/02/14 1437 08/03/14 0430 08/04/14 0313  NA 139 139 135  K 4.0 4.0 4.6  CL 101 106 104  CO2 26 22 22   GLUCOSE 103* 154* 133*  BUN 12 15 17   CREATININE 0.87 1.09 0.94  CALCIUM 9.6 9.1 8.9   Liver Function Tests: No results for input(s): AST, ALT, ALKPHOS, BILITOT, PROT, ALBUMIN in the last 168 hours. No results for input(s): LIPASE, AMYLASE in the last 168 hours. No results for input(s): AMMONIA in the last 168 hours. CBC:  Recent Labs Lab 08/02/14 1437 08/03/14 0430 08/04/14 0313  WBC 8.6 6.1 11.1*  NEUTROABS 4.4  --   --   HGB 11.0* 10.9* 9.9*  HCT 33.4* 34.6* 30.5*  MCV 86.8 88.9 85.4  PLT 232 271 245   Cardiac Enzymes:  Recent Labs Lab 08/02/14 1434 08/02/14 2249 08/03/14 0430  TROPONINI <0.30 <0.30 <0.03   BNP: BNP (last 3 results)  Recent Labs  08/07/13 1324  PROBNP 130.7*   CBG: No results for input(s): GLUCAP in the last 168 hours.     SignedCristal Ford  [facility] 08/04/2014, 10:25 AM

## 2014-08-04 NOTE — Discharge Instructions (Signed)
Cerebral Aneurysm An aneurysm is the bulging or ballooning out of part of the weakened wall of a vein or artery. An aneurysm in the vein or artery of the brain is called a brain aneurysm, or cerebral aneurysm.  Aneurysms are a risk to your health because they may leak or rupture. Once the aneurysm leaks or ruptures, bleeding occurs. If the bleeding occurs within the brain tissue, the condition is called an intracerebral hemorrhage. An intracerebral hemorrhage can result in a hemorrhagic stroke. If the bleeding occurs in the area between the brain and the thin tissues that cover the brain, the condition is called a subarachnoid hemorrhage. This increases the pressure on the brain and causes some areas of the brain to not get the necessary blood flow. The blood from the ruptured aneurysm collects and presses on the surrounding brain tissue. A subarachnoid hemorrhage can cause a stroke. A ruptured cerebral aneurysm is a medical emergency. This can cause permanent damage and loss of brain function. CAUSES A cerebral aneurysm is caused when a weakened part of the blood vessel expands. The blood vessel expands due to the constant pressure from the flow of blood through the weakened blood vessel. Usually the aneurysm expands slowly. As the weakened aneurysm expands, the walls of the aneurysm become weaker. Aneurysms may be associated with diseases that weaken and damage the walls of your blood vessels or blood vessels that develop abnormally. Some known causes for cerebral aneurysms are:  Head trauma.  Infection.  Use of "recreational drugs" such as cocaine or amphetamines. RISK FACTORS People at risk for a cerebral aneurysm or hemorrhagic stroke usually have one or more risk factors, which include:  Having high blood pressure (hypertension).  Abusing alcohol.  Having abnormal blood vessels present since birth.  Having certain bleeding disorders, such as hemophilia, sickle cell disease, or liver  disease.  Taking blood thinners (anticoagulants).  Smoking. SIGNS AND SYMPTOMS  The signs and symptoms of an unruptured cerebral aneurysm will partly depend on its size and rate of growth. A small, unchanging aneurysm generally does not produce symptoms. A larger aneurysm that is steadily growing can increase pressure on the brain or nerves. That increased pressure from the unruptured cerebral aneurysm can cause:  A headache.  Problems with your vision.  Numbness or weakness in an arm or leg.  Problems with memory.  Problems speaking.  Seizures. If an aneurysm leaks or bursts, it can cause a stroke and be life-threatening. Symptoms may include:  A sudden, severe headache with no known cause. The headache is often described as the worst headache ever experienced.  Nausea or vomiting, especially when combined with other symptoms such as a headache.  Sudden weakness or numbness of the face, arm, or leg, especially on one side of the body.  Sudden trouble walking or difficulty moving arms or legs.  Sudden confusion.  Sudden personality changes.  Trouble speaking (aphasia) or understanding.  Difficulty swallowing.  Sudden trouble seeing in one or both eyes.  Double vision.  Dizziness.  Loss of balance or coordination.  Intolerance to light.  Stiff neck. DIAGNOSIS  A CTA (computed tomographic angiography) may be performed to diagnose an aneurysm. A CTA uses dye and a CT scanner to take images of your blood vessels. An MRA (magnetic resonance angiography) may be used to diagnose an aneurysm. An MRA is performed in an MRI machine. While in the MRI machine, images of your blood vessels are taken. A cerebral aneurysm may also be diagnosed with a  cerebral angiogram. A cerebral angiogram requires a tube called a catheter to be inserted into a blood vessel and advanced to the blood vessels in your neck. Dye is then injected while X-ray images are taken to show the blood vessels in  your brain. TREATMENT  Unruptured Aneurysms Treatment is complex when an aneurysm is found and it is not causing problems. Treatment is very individualized, as each case is different. Many things must be considered, such as the size and exact location of your aneurysm, your age, your overall health, and your feelings and preferences. Small aneurysms in certain locations of the brain have a very low chance of bleeding or rupturing. These small aneurysms may not be treated. However, depending on the size and location of the aneurysm, treatments may be recommended and include:  Coiling. During this procedure, a catheter is inserted and advanced through a blood vessel. Once the catheter reaches the aneurysm, tiny coils are used to block blood flow into the aneurysm.  Surgical clipping. During surgery, a clip is placed at the base of the aneurysm. The clip prevents blood from continuing to enter the aneurysm. Ruptured Aneurysms Immediate emergency surgery may be needed to help prevent damage to the brain and to reduce the risk of rebleeding. Timing of treatment is an important factor in the prevention of complications. Successful early treatment of a ruptured aneurysm (within the first 3 days of a bleed) helps to prevent rebleeding and blood vessel spasm. In some cases, there may be a reason to treat later (10-14 days after a rupture). Many things are considered when making this decision, and each case is handled individually. HOME CARE INSTRUCTIONS  Take medicines only as instructed by your health care provider.  Eat healthy foods. It is recommended that you eat 5 or more servings of fruits and vegetables each day. Foods may need to be a special consistency (soft or pureed), or small bites may need to be taken if you have had a ruptured aneurysm or stroke. Certain dietary changes may be advised to address high blood pressure, high cholesterol, diabetes, or obesity.  Food choices that are low in salt  (sodium), saturated fat, trans fat, and cholesterol are recommended to manage high blood pressure.  Food choices that are high in fiber and low in saturated fat, trans fat, and cholesterol are recommended to control cholesterol levels.  Controlling carbohydrate and sugar intake is recommended to manage diabetes.  Reducing calorie intake and making food choices that are low in sodium, saturated fat, trans fat, and cholesterol are recommended to manage obesity.  Maintain a healthy weight.  Stay physically active. It is recommended that you get at least 30 minutes of activity on most or all days.  Do not smoke.  Limit alcohol use. Moderate alcohol use is considered to be:  No more than 2 drinks each day for men.  No more than 1 drink each day for nonpregnant women.  Stop drug abuse.  A safe home environment is important to reduce the risk of falls. Your health care provider may arrange for specialists to evaluate your home. Having grab bars in the bedroom and bathroom is often important. Your health care provider may arrange for special equipment to be used at home, such as raised toilets and a seat for the shower.  Physical, occupational, and speech therapy. Ongoing therapy may be needed to maximize your recovery after a ruptured aneurysm or stroke. If you have been advised to use a walker or a cane, use  it at all times. Be sure to keep your therapy appointments.  Follow all instructions for follow-up with your health care provider. This is very important. This includes any referrals, physical therapy, rehabilitation, and laboratory tests. Proper follow-up may prevent an aneurysm rupture or a stroke. SEEK IMMEDIATE MEDICAL CARE IF:  You have a sudden, severe headache with no known cause.  You have sudden nausea or vomiting with a severe headache.  You have sudden weakness or numbness of the face, arm, or leg, especially on one side of the body.  You have sudden trouble walking or  difficulty moving arms or legs.  You have sudden confusion.  You have trouble speaking or understanding.  You have sudden trouble seeing in one or both eyes.  You have a sudden loss of balance or coordination.  You have a stiff neck.  You have difficulty breathing.  You have a partial or total loss of consciousness. Any of these symptoms may represent a serious problem that is an emergency. Do not wait to see if the symptoms will go away. Get medical help at once. Call your local emergency services (911 in [place] Do not drive yourself to the hospital. Document Released: 04/21/2002 Document Revised: 12/14/2013 Document Reviewed: 01/15/2013 [facility] Patient Information 2015 [place], [place]. This information is not intended to replace advice given to you by your health care provider. Make sure you discuss any questions you have with your health care provider. Chest Pain (Nonspecific) It is often hard to give a specific diagnosis for the cause of chest pain. There is always a chance that your pain could be related to something serious, such as a heart attack or a blood clot in the lungs. You need to follow up with your health care provider for further evaluation. CAUSES   Heartburn.  Pneumonia or bronchitis.  Anxiety or stress.  Inflammation around your heart (pericarditis) or lung (pleuritis or pleurisy).  A blood clot in the lung.  A collapsed lung (pneumothorax). It can develop suddenly on its own (spontaneous pneumothorax) or from trauma to the chest.  Shingles infection (herpes zoster virus). The chest wall is composed of bones, muscles, and cartilage. Any of these can be the source of the pain.  The bones can be bruised by injury.  The muscles or cartilage can be strained by coughing or overwork.  The cartilage can be affected by inflammation and become sore (costochondritis). DIAGNOSIS  Lab tests or other studies may be needed to find the cause of your pain. Your health  care provider may have you take a test called an ambulatory electrocardiogram (ECG). An ECG records your heartbeat patterns over a 24-hour period. You may also have other tests, such as:  Transthoracic echocardiogram (TTE). During echocardiography, sound waves are used to evaluate how blood flows through your heart.  Transesophageal echocardiogram (TEE).  Cardiac monitoring. This allows your health care provider to monitor your heart rate and rhythm in real time.  Holter monitor. This is a portable device that records your heartbeat and can help diagnose heart arrhythmias. It allows your health care provider to track your heart activity for several days, if needed.  Stress tests by exercise or by giving medicine that makes the heart beat faster. TREATMENT   Treatment depends on what may be causing your chest pain. Treatment may include:  Acid blockers for heartburn.  Anti-inflammatory medicine.  Pain medicine for inflammatory conditions.  Antibiotics if an infection is present.  You may be advised to change lifestyle habits.  This includes stopping smoking and avoiding alcohol, caffeine, and chocolate.  You may be advised to keep your head raised (elevated) when sleeping. This reduces the chance of acid going backward from your stomach into your esophagus. Most of the time, nonspecific chest pain will improve within 2-3 days with rest and mild pain medicine.  HOME CARE INSTRUCTIONS   If antibiotics were prescribed, take them as directed. Finish them even if you start to feel better.  For the next few days, avoid physical activities that bring on chest pain. Continue physical activities as directed.  Do not use any tobacco products, including cigarettes, chewing tobacco, or electronic cigarettes.  Avoid drinking alcohol.  Only take medicine as directed by your health care provider.  Follow your health care provider's suggestions for further testing if your chest pain does not go  away.  Keep any follow-up appointments you made. If you do not go to an appointment, you could develop lasting (chronic) problems with pain. If there is any problem keeping an appointment, call to reschedule. SEEK MEDICAL CARE IF:   Your chest pain does not go away, even after treatment.  You have a rash with blisters on your chest.  You have a fever. SEEK IMMEDIATE MEDICAL CARE IF:   You have increased chest pain or pain that spreads to your arm, neck, jaw, back, or abdomen.  You have shortness of breath.  You have an increasing cough, or you cough up blood.  You have severe back or abdominal pain.  You feel nauseous or vomit.  You have severe weakness.  You faint.  You have chills. This is an emergency. Do not wait to see if the pain will go away. Get medical help at once. Call your local emergency services (911 in [place] Do not drive yourself to the hospital. MAKE SURE YOU:   Understand these instructions.  Will watch your condition.  Will get help right away if you are not doing well or get worse. Document Released: 05/09/2005 Document Revised: 08/04/2013 Document Reviewed: 03/04/2008 [facility] Patient Information 2015 [place], [place]. This information is not intended to replace advice given to you by your health care provider. Make sure you discuss any questions you have with your health care provider.

## 2014-08-04 NOTE — Discharge Summary (Signed)
Physician Discharge Summary  Patient ID: [name] MRN: [id number] DOB/AGE: 1940-12-27 73 y.o.  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 EKG-Sinus rhythm. Past medical history significant for coronary artery disease, status post PTCA and stenting to proximal and mid LAD and diagonal 1 in the past, followed by aggressive restenosis, requiring CABG x 3. In May 2001, she had LIMA to LAD, saphenous vein graft to diagonal 1 and saphenous vein graft to distal RCA. She also has hypertension, hypercholesteremia, peripheral vascular disease, status post bilateral common iliac stenting and left external iliac stenting, history of bronchial asthma, history of tobacco abuse and GERD. She underwent coronary angiography showing patent LIMA to LAD and mild diffuse disease of RCA. Both SVGs to RCA and diagonal were occluded. She will undergo carotid angiogram by Dr. Estanislado Pandy on 08/09/2014 on OP basis. She will be followed by Dr. [name] and Dr. Terrence Dupont in 2-4 weeks.   Consults: cardiology  Significant Diagnostic Studies: Hgb 9.9. BMET-normal.  Angiography: Mild to moderate  multivessel native vessel CAD. Patent LIMA to LAD. Occluded SVG to diagonal and RCA.  EKG-SB.  MRI HEAD: No acute intracranial process ; normal noncontrast MRI of the brain for age.  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 In addition, 2 mm superiorly directed RIGHT middle cerebral artery bifurcation.  Similarly attenuated distal LEFT posterior cerebral artery. Probable 2 mm aneurysm RIGHT P2 3 junction. For constellation of findings, CT angiogram of the head may be provide greater detail considering diminutive size of the aneurysms.  Treatments: cardiac meds: amlodipine and aspirin, plavix, Crestor and NTG patch.  Discharge Exam: Blood pressure 136/47, pulse 57, temperature 97.7 F (36.5 C), temperature source Oral, resp. rate 18, height 5\' 2"  (1.575 m), weight 76.7 kg (169 lb 1.5 oz), SpO2 95 %. General: Appears to be in no distress, well nourished;cooperative with exam, family (son) at bedside. Eye: pupils equal, round and reactive; sclera anicteric; normal conjunctiva  Nose/throat: oropharynx clear, moist mucous membranes, pink gums  Neck: supple Lungs/Chest wall: Clear to auscultation bilaterally.  Heart: normal rate and regular rhythm; III/VI systolic murmurs Pulses: radial pulses are 2+ and symmetric. 1 + dorsalis pedis pulses. 2 + femoral pulses  Abdomen: Normal fullness, no rebound, guarding, or rigidity; normal bowel sounds.  Skin: warm, dry, intact, normal turgor, no rashes  Extremities: no peripheral edema, clubbing, or cyanosis,. + spider veins lower legs. No right groin hematoma. Neurologic: A&O X3, CN II - XII are grossly intact. Motor strength is 5/5 in the all 4 extremities  Disposition: 01-Home-self care     Medication List    TAKE these medications        acidophilus Caps capsule  Take 2 capsules by mouth daily.     albuterol 108 (90 BASE) MCG/ACT inhaler  Commonly known as:  VENTOLIN HFA  Inhale 2 puffs into  the lungs every 4 (four) hours as needed for wheezing or shortness of breath (((PLAN B))).     albuterol (2.5 MG/3ML) 0.083% nebulizer solution  Commonly known as:  PROVENTIL  Take 2.5 mg by nebulization every 4 (four) hours as needed for wheezing or shortness of breath (((PLAN C))).     ALPRAZolam 0.25 MG tablet  Commonly known as:  XANAX  Take 0.25 mg by mouth 2 (two) times daily as needed for anxiety.     amLODipine-olmesartan 10-40 MG per tablet  Commonly known as:  AZOR  Take 1 tablet by mouth daily.     aspirin EC 81 MG tablet  Take 81 mg by mouth daily.     budesonide 0.25 MG/2ML nebulizer solution  Commonly known as:  PULMICORT  One twice daily with perforomist     cetirizine 1 MG/ML syrup  Commonly known as:  ZYRTEC  Take 5 mg by mouth daily as needed (For allergies.).     cloNIDine 0.1 MG tablet  Commonly known as:  CATAPRES  Take 0.05 mg by mouth 2 (two) times daily. Take 1 1/2 twice daily     clopidogrel 75 MG tablet  Commonly known as:  PLAVIX  Take 1 tablet (75 mg total) by mouth daily.     dexlansoprazole 60 MG capsule  Commonly known as:  DEXILANT  Take 1 capsule (60 mg total) by mouth daily before breakfast.     meclizine 25 MG tablet  Commonly known as:  ANTIVERT  Take 25 mg by mouth 2 (two) times daily as needed for dizziness.     nitroGLYCERIN 0.4 mg/hr patch  Commonly known as:  NITRODUR - Dosed in mg/24 hr  Place 0.4 mg onto the skin daily.     nitroGLYCERIN 0.4 MG SL tablet  Commonly known as:  NITROSTAT  Place 0.4 mg under the tongue continuous as needed for chest pain.     prednisoLONE acetate 1 % ophthalmic suspension  Commonly known as:  PRED FORTE  Place 2 drops into the right eye at bedtime. Administer 2 drops to the right eye 2 (two) times a day.     rosuvastatin 10 MG tablet  Commonly known as:  CRESTOR  Take 10 mg by mouth at bedtime.           Follow-up Information    Follow up with BOUSKA,DAVID E, MD. Schedule an  appointment as soon as possible for a visit in 2 weeks.   Specialty:  Family Medicine   Contact information:   [address] [place] [address] [phone]       Follow up with Clent Demark, MD. Schedule an appointment as soon as possible for a visit in 1 month.   Specialty:  Cardiology   Contact information:   [place] [address] [place] [address] [phone]       Signed: Birdie Riddle 08/04/2014, 10:00 AM

## 2014-08-05 ENCOUNTER — Other Ambulatory Visit: Payer: Self-pay | Admitting: Radiology

## 2014-08-09 ENCOUNTER — Other Ambulatory Visit (HOSPITAL_COMMUNITY): Payer: Self-pay | Admitting: Internal Medicine

## 2014-08-09 ENCOUNTER — Ambulatory Visit (HOSPITAL_COMMUNITY)
Admission: RE | Admit: 2014-08-09 | Discharge: 2014-08-09 | Disposition: A | Discharge status: Home / Self Care | Payer: Medicare HMO | Source: Ambulatory Visit | Attending: Internal Medicine | Admitting: Internal Medicine

## 2014-08-09 ENCOUNTER — Ambulatory Visit (HOSPITAL_COMMUNITY)
Admit: 2014-08-09 | Discharge: 2014-08-09 | Disposition: A | Discharge status: Home / Self Care | Payer: Medicare HMO | Attending: Internal Medicine | Admitting: Internal Medicine

## 2014-08-09 DIAGNOSIS — I6501 Occlusion and stenosis of right vertebral artery: Secondary | ICD-10-CM | POA: Diagnosis not present

## 2014-08-09 DIAGNOSIS — I2510 Atherosclerotic heart disease of native coronary artery without angina pectoris: Secondary | ICD-10-CM | POA: Diagnosis not present

## 2014-08-09 DIAGNOSIS — I10 Essential (primary) hypertension: Secondary | ICD-10-CM

## 2014-08-09 DIAGNOSIS — J45909 Unspecified asthma, uncomplicated: Secondary | ICD-10-CM | POA: Diagnosis not present

## 2014-08-09 DIAGNOSIS — Z87891 Personal history of nicotine dependence: Secondary | ICD-10-CM | POA: Insufficient documentation

## 2014-08-09 DIAGNOSIS — I671 Cerebral aneurysm, nonruptured: Secondary | ICD-10-CM | POA: Diagnosis not present

## 2014-08-09 DIAGNOSIS — Z7982 Long term (current) use of aspirin: Secondary | ICD-10-CM | POA: Diagnosis not present

## 2014-08-09 DIAGNOSIS — E785 Hyperlipidemia, unspecified: Secondary | ICD-10-CM | POA: Diagnosis not present

## 2014-08-09 DIAGNOSIS — J449 Chronic obstructive pulmonary disease, unspecified: Secondary | ICD-10-CM | POA: Insufficient documentation

## 2014-08-09 DIAGNOSIS — I6521 Occlusion and stenosis of right carotid artery: Secondary | ICD-10-CM | POA: Insufficient documentation

## 2014-08-09 DIAGNOSIS — K219 Gastro-esophageal reflux disease without esophagitis: Secondary | ICD-10-CM | POA: Insufficient documentation

## 2014-08-09 DIAGNOSIS — Z7902 Long term (current) use of antithrombotics/antiplatelets: Secondary | ICD-10-CM | POA: Insufficient documentation

## 2014-08-09 DIAGNOSIS — Z7952 Long term (current) use of systemic steroids: Secondary | ICD-10-CM | POA: Diagnosis not present

## 2014-08-09 DIAGNOSIS — Z8489 Family history of other specified conditions: Secondary | ICD-10-CM | POA: Diagnosis not present

## 2014-08-09 DIAGNOSIS — Z7951 Long term (current) use of inhaled steroids: Secondary | ICD-10-CM | POA: Diagnosis not present

## 2014-08-09 DIAGNOSIS — Z951 Presence of aortocoronary bypass graft: Secondary | ICD-10-CM | POA: Insufficient documentation

## 2014-08-09 DIAGNOSIS — Z79899 Other long term (current) drug therapy: Secondary | ICD-10-CM | POA: Diagnosis not present

## 2014-08-09 DIAGNOSIS — R51 Headache: Secondary | ICD-10-CM | POA: Diagnosis present

## 2014-08-09 DIAGNOSIS — I252 Old myocardial infarction: Secondary | ICD-10-CM | POA: Insufficient documentation

## 2014-08-09 LAB — CBC WITH DIFFERENTIAL/PLATELET
Basophils Absolute: 0 10*3/uL (ref 0.0–0.1)
Basophils Relative: 0 % (ref 0–1)
EOS ABS: 0.6 10*3/uL (ref 0.0–0.7)
EOS PCT: 5 % (ref 0–5)
HEMATOCRIT: 33.7 % — AB (ref 36.0–46.0)
Hemoglobin: 11.1 g/dL — ABNORMAL LOW (ref 12.0–15.0)
LYMPHS ABS: 3.3 10*3/uL (ref 0.7–4.0)
Lymphocytes Relative: 31 % (ref 12–46)
MCH: 28.2 pg (ref 26.0–34.0)
MCHC: 32.9 g/dL (ref 30.0–36.0)
MCV: 85.5 fL (ref 78.0–100.0)
Monocytes Absolute: 0.9 10*3/uL (ref 0.1–1.0)
Monocytes Relative: 8 % (ref 3–12)
Neutro Abs: 5.9 10*3/uL (ref 1.7–7.7)
Neutrophils Relative %: 56 % (ref 43–77)
PLATELETS: 269 10*3/uL (ref 150–400)
RBC: 3.94 MIL/uL (ref 3.87–5.11)
RDW: 13.7 % (ref 11.5–15.5)
WBC: 10.6 10*3/uL — ABNORMAL HIGH (ref 4.0–10.5)

## 2014-08-09 LAB — BASIC METABOLIC PANEL
ANION GAP: 7 (ref 5–15)
BUN: 12 mg/dL (ref 6–23)
CALCIUM: 9 mg/dL (ref 8.4–10.5)
CO2: 26 mmol/L (ref 19–32)
CREATININE: 1.03 mg/dL (ref 0.50–1.10)
Chloride: 107 mEq/L (ref 96–112)
GFR calc non Af Amer: 53 mL/min — ABNORMAL LOW (ref >90)
GFR, EST AFRICAN AMERICAN: 62 mL/min — AB (ref >90)
Glucose, Bld: 116 mg/dL — ABNORMAL HIGH (ref 70–99)
Potassium: 3.7 mmol/L (ref 3.5–5.1)
Sodium: 140 mmol/L (ref 135–145)

## 2014-08-09 LAB — PROTIME-INR
INR: 1.03 (ref 0.00–1.49)
Prothrombin Time: 13.7 seconds (ref 11.6–15.2)

## 2014-08-09 MED ORDER — SODIUM CHLORIDE 0.9 % IV SOLN
INTRAVENOUS | Status: DC
  Administered 2014-08-09: 07:00:00 via INTRAVENOUS

## 2014-08-09 MED ORDER — SODIUM CHLORIDE 0.9 % IV SOLN: INTRAVENOUS | Status: AC

## 2014-08-09 MED ORDER — HEPARIN SOD (PORK) LOCK FLUSH 100 UNIT/ML IV SOLN
INTRAVENOUS | Status: AC
  Filled 2014-08-09: qty 25

## 2014-08-09 MED ORDER — IOHEXOL 300 MG/ML  SOLN
150.0000 mL | Freq: Once | INTRAMUSCULAR | Status: AC | PRN
  Administered 2014-08-09: 80 mL via INTRA_ARTERIAL

## 2014-08-09 MED ORDER — SODIUM CHLORIDE 0.9 % IR SOLN
Status: AC | PRN
  Administered 2014-08-09: 10:00:00

## 2014-08-09 MED ORDER — FENTANYL CITRATE 0.05 MG/ML IJ SOLN
INTRAMUSCULAR | Status: AC | PRN
  Administered 2014-08-09 (×2): 25 ug via INTRAVENOUS

## 2014-08-09 MED ORDER — MIDAZOLAM HCL 2 MG/2ML IJ SOLN
INTRAMUSCULAR | Status: AC
  Filled 2014-08-09: qty 4

## 2014-08-09 MED ORDER — MIDAZOLAM HCL 2 MG/2ML IJ SOLN
INTRAMUSCULAR | Status: AC | PRN
Start: 2014-08-09 — End: 2014-08-09
  Administered 2014-08-09: 1 mg via INTRAVENOUS

## 2014-08-09 MED ORDER — LIDOCAINE HCL 1 % IJ SOLN
INTRAMUSCULAR | Status: AC
  Filled 2014-08-09: qty 20

## 2014-08-09 MED ORDER — FENTANYL CITRATE 0.05 MG/ML IJ SOLN
INTRAMUSCULAR | Status: AC
  Filled 2014-08-09: qty 4

## 2014-08-09 NOTE — Procedures (Signed)
S/P 4 vessel cerebral arteriograms. RT CFA approach. Findings. 1.approx 74mm x 1.8 mm RT MCA bifurcation aneurysm 2.Approx 65 % stenosis prox RT cavernous ICA 3.Approx 50 % stenosis of RT VA origin

## 2014-08-09 NOTE — Sedation Documentation (Signed)
Patient denies pain and is resting comfortably.  

## 2014-08-09 NOTE — Sedation Documentation (Addendum)
Patient C/O of  Pain.

## 2014-08-09 NOTE — H&P (Signed)
Chief Complaint: Headaches, possible cerebral aneurysms Referring Physician(s): Mikhail,Maryann  History of Present Illness: [name] is a 73 y.o. female with history of unstable angina/CAD/CABG, HTN, PVD with prior iliac artery stenting, smoking, hyperlipidemia, and headaches . She also has a strong family hx of cerebral aneurysms. Recent MRA head has revealed 2 mm outpouching of inferior aspect of right cavernous carotid, 2 mm right MCA bifurcation aneurysm and 2 mm right P 2-3 junction outpouching. She presents today for diagnostic cerebral arteriogram for further evaluation.   Past Medical History  Diagnosis Date  . Asthma   . Reflux   . Hiatal hernia   . Peripheral vascular disease   . Hypertension   . Hyperlipidemia   . Iliac artery aneurysm   . Aortoiliac occlusive disease   . 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 Carotid artery occlusion   . Myocardial infarction 01/01/2000    Cardiac catheterization  . COPD (chronic obstructive pulmonary disease)   . Coronary artery disease   . Arnold-Chiari malformation 1998  . 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  . Gastroesophageal reflux disease     Past Surgical History  Procedure Laterality Date  . Eye surgery      Laser surgery for retinal hemorrhage  . Rotator cuff repair      Right  . Coronary artery bypass graft  01/01/2000  . Post coronary artery  bpg  01/05/2000    Right jugular sheath removed  . Carotid endarterectomy  03/29/2010    Left  CEA  . Pr vein bypass graft,aorto-fem-pop    . Arnold-chiari malformation repair  1998    Suboccipital craniectomy  . Corneal transplant      Right  . Appendectomy    . Cholecystectomy    . Abdominal hysterectomy    . Left heart catheterization with coronary angiogram N/A 08/03/2014    Procedure: LEFT HEART CATHETERIZATION WITH CORONARY ANGIOGRAM;  Surgeon: Birdie Riddle, MD;  Location: [facility];  Service: Cardiovascular;  Laterality: N/A;     Allergies: Codeine; Oxycodone-acetaminophen; Risedronate; and Avelox  Medications: Prior to Admission medications   Medication Sig Start Date End Date Taking? Authorizing Provider  amLODipine-olmesartan (AZOR) 10-40 MG per tablet Take 1 tablet by mouth daily.   Yes Historical Provider, MD  aspirin EC 81 MG tablet Take 81 mg by mouth daily.    Yes Historical Provider, MD  cetirizine (ZYRTEC) 1 MG/ML syrup Take 5 mg by mouth daily as needed (For allergies.).    Yes Historical Provider, MD  cloNIDine (CATAPRES) 0.1 MG tablet Take 0.05 mg by mouth 2 (two) times daily. Take 1 1/2 twice daily   Yes Historical Provider, MD  clopidogrel (PLAVIX) 75 MG tablet Take 1 tablet (75 mg total) by mouth daily. 04/02/12  Yes Conrad Bethel, MD  dexlansoprazole (DEXILANT) 60 MG capsule Take 1 capsule (60 mg total) by mouth daily before breakfast. 11/17/12  Yes Tanda Rockers, MD  meclizine (ANTIVERT) 25 MG tablet Take 25 mg by mouth 2 (two) times daily as needed for dizziness.  06/28/14  Yes Historical Provider, MD  nitroGLYCERIN (NITRODUR - DOSED IN MG/24 HR) 0.4 mg/hr patch Place 0.4 mg onto the skin daily.  02/01/14  Yes Historical Provider, MD  nitroGLYCERIN (NITROSTAT) 0.4 MG SL tablet Place 0.4 mg under the tongue continuous as needed for chest pain.    Yes Historical Provider, MD  prednisoLONE acetate (PRED FORTE) 1 % ophthalmic suspension Place 2 drops into the right eye  at bedtime. Administer 2 drops to the right eye 2 (two) times a day. 02/26/14  Yes Historical Provider, MD  rosuvastatin (CRESTOR) 10 MG tablet Take 10 mg by mouth at bedtime.    Yes Historical Provider, MD  acidophilus (RISAQUAD) CAPS capsule Take 2 capsules by mouth daily. Patient not taking: Reported on 08/02/2014 04/06/13   Donne Hazel, MD  albuterol (PROVENTIL) (2.5 MG/3ML) 0.083% nebulizer solution Take 2.5 mg by nebulization every 4 (four) hours as needed for wheezing or shortness of breath (((PLAN C))).     Historical Provider, MD   albuterol (VENTOLIN HFA) 108 (90 BASE) MCG/ACT inhaler Inhale 2 puffs into the lungs every 4 (four) hours as needed for wheezing or shortness of breath (((PLAN B))). 08/09/13   Pollie Friar, MD  ALPRAZolam Duanne Moron) 0.25 MG tablet Take 0.25 mg by mouth 2 (two) times daily as needed for anxiety.  03/05/14   Historical Provider, MD  budesonide (PULMICORT) 0.25 MG/2ML nebulizer solution One twice daily with perforomist Patient taking differently: Take 0.25 mg by nebulization 2 (two) times daily as needed (For shortness of breath.).  11/17/12   Tanda Rockers, MD  gabapentin (NEURONTIN) 100 MG capsule Take 1 capsule (100 mg total) by mouth 3 (three) times daily. 08/04/14   Cristal Ford, DO    Family History  Problem Relation Age of Onset  . Heart disease Mother     Heart Disease before age 35  . Hypertension Mother   . Hyperlipidemia Mother   . Heart attack Mother   . Clotting disorder Mother   . Heart disease Father     Heart Disease before age 30  . Heart attack Father   . Heart disease Brother     Heart Disease before age 2  . Hyperlipidemia Brother   . Hypertension Brother   . Clotting disorder Brother   . Cerebral aneurysm Sister   . Asthma Sister   . Cerebral aneurysm Brother   . Cancer Brother   . Heart disease Brother   . Heart disease Brother   . Heart disease Brother     History   Social History  . Marital Status: Widowed    Spouse Name: N/A    Number of Children: 4  . Years of Education: 7TH   Social History Main Topics  . Smoking status: Former Smoker -- 1.00 packs/day for 30 years    Types: Cigarettes    Quit date: 08/13/2000  . Smokeless tobacco: Never Used  . Alcohol Use: No  . Drug Use: No  . Sexual Activity: Not on file   Other Topics Concern  . Not on file   Social History Narrative         Review of Systems  Constitutional: Negative for fever.  Respiratory: Positive for cough and shortness of breath.   Cardiovascular: Negative for chest  pain.  Gastrointestinal: Negative for vomiting, abdominal pain and blood in stool.       Intermittent nausea; mild rt groin tenderness secondary to recent cath  Genitourinary: Negative for dysuria and hematuria.  Musculoskeletal: Negative for back pain.  Neurological: Positive for headaches. Negative for syncope, speech difficulty and numbness.    Vital Signs: BP 119/49 mmHg  Pulse 50  Temp(Src) 97.9 F (36.6 C) (Oral)  Resp 18  Ht 5\' 2"  (1.575 m)  Wt 170 lb (77.111 kg)  BMI 31.09 kg/m2  SpO2 100%  Physical Exam  Constitutional: She is oriented to person, place, and time. She appears well-developed  and well-nourished.  Cardiovascular: Regular rhythm.   bradycardic  Pulmonary/Chest: Effort normal.  Distant BS with few insp/exp wheezes  Abdominal: Soft. Bowel sounds are normal. There is no tenderness.  Musculoskeletal: Normal range of motion. She exhibits no edema.  Neurological: She is alert and oriented to person, place, and time. No cranial nerve deficit. Coordination normal.    Imaging: Ct Angio Chest Pe W/cm &/or Wo Cm  08/02/2014   CLINICAL DATA:  Chest pain radiating toward back  EXAM: CT ANGIOGRAPHY CHEST WITH CONTRAST  TECHNIQUE: Multidetector CT imaging of the chest was performed using the standard protocol during bolus administration of intravenous contrast. Multiplanar CT image reconstructions and MIPs were obtained to evaluate the vascular anatomy.  CONTRAST:  118mL OMNIPAQUE IOHEXOL 350 MG/ML SOLN  COMPARISON:  Chest radiograph August 07, 2013  FINDINGS: There is no appreciable pulmonary embolus. There is atherosclerotic change in the aorta, but there is no appreciable aneurysm or dissection. There is some thrombus in the periphery of the mid descending thoracic aorta. There is moderate calcification at the origins of the great vessels. No aneurysm is seen in these areas. The left and right common carotid arteries arise as a common trunk.  Patient is status post  coronary artery bypass grafting. There is extensive coronary artery calcification.  There is scarring in each lung apex. There is a semisolid opacity medial segment of the right middle lobe measuring 9 x 7 mm. This opacity is seen on axial slices 40 and 41 series 7. There is a semi-solid nodular opacity in the superior segment right lower lobe which has a subtle central lucency measuring 6 x 7 mm. This opacity is seen on axial slice 48 series 7. There is patchy atelectasis in each lung base. There is benign pleural thickening in the posterior left base.  There is no appreciable thoracic adenopathy. The pericardium is not thickened.  In the visualized upper abdomen, there is atherosclerotic change in aorta. There is hepatic steatosis.  Thyroid appears unremarkable. There are no blastic or lytic bone lesions.  Review of the MIP images confirms the above findings.  IMPRESSION: No demonstrable pulmonary embolus. There is atherosclerotic change in aorta with some peripheral thrombus in the mid descending thoracic aorta. There is no aneurysm or dissection in the thoracic aorta, however.  There are semi-solid opacities on the right as noted above. The largest of these opacities measures 9 x 7 mm. It must be cautioned that early neoplasm can present in this manner. Followup noncontrast enhanced CT in 3 months to further assess advised. There also scattered areas of lung scarring. There is benign-appearing pleural thickening posterior left base.  Hepatic steatosis.   Electronically Signed   By: Lowella Grip M.D.   On: 08/02/2014 17:27   Mr [name] Head Wo Contrast  08/02/2014   CLINICAL DATA:  Chest pain today while getting lower extremity Doppler, symptoms similar to prior heart attack. Chronic headaches, son with ruptured aneurysm.  EXAM: MRI HEAD WITHOUT CONTRAST  MRA HEAD WITHOUT CONTRAST  TECHNIQUE: Multiplanar, multiecho pulse sequences of the brain and surrounding structures were obtained without intravenous  contrast. Angiographic images of the head were obtained using MRA technique without contrast.  COMPARISON:  MRI and MRA of the head June 12, 2013  FINDINGS: MRI HEAD FINDINGS  No reduced diffusion to suggest acute ischemia. Less than 5 supra and infratentorial punctate foci of susceptibility artifact were present on prior imaging. No midline shift or mass effect. The ventricles and sulci are normal  for patient's age. A few scattered subcentimeter supratentorial white matter T2 hyperintensities are less than expected for age.  No abnormal extra-axial fluid collections. RIGHT ocular lens implant. Paranasal sinusitis with sphenoid air-fluid level, new. Under pneumatized mastoid air cells. No abnormal sellar expansion. No cerebellar tonsillar ectopia. Patient is edentulous.  MRA HEAD FINDINGS  Anterior circulation: Normal flow related enhancement of the included cervical, petrous, cavernous and supra clinoid internal carotid arteries. 2 mm outpouching along inferior aspect of RIGHT cavernous carotid. Patent anterior communicating artery. Normal flow related enhancement of the anterior and middle cerebral arteries, including more distal segments. 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 Supernumerary anterior cerebral artery arising from RIGHT A1 2 junction.  No large vessel occlusion, hemodynamically significant stenosis, abnormal luminal irregularity.  Posterior circulation: LEFT vertebral artery is dominant. Basilar artery is patent, with normal flow related enhancement of the main branch vessels. Normal proximal flow related enhancement of the posterior cerebral arteries. Similar attenuated distal LEFT posterior cerebral artery flow related enhancement. 2 mm focal outpouching RIGHT P2 3 junction.  No large vessel occlusion, hemodynamically significant stenosis, abnormal luminal irregularity.  IMPRESSION: MRI HEAD: No acute intracranial process ; normal noncontrast MRI of the  brain for age.  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 In addition, 2 mm superiorly directed RIGHT middle cerebral artery bifurcation.  Similarly attenuated distal LEFT posterior cerebral artery. Probable 2 mm aneurysm RIGHT P2 3 junction. For constellation of findings, CT angiogram of the head may be provide greater detail considering diminutive size of the aneurysms.   Electronically Signed   By: Elon Alas   On: 08/02/2014 22:34   [name] Contrast  08/02/2014   CLINICAL DATA:  Chest pain today while getting lower extremity Doppler, symptoms similar to prior heart attack. Chronic headaches, son with ruptured aneurysm.  EXAM: MRI HEAD WITHOUT CONTRAST  MRA HEAD WITHOUT CONTRAST  TECHNIQUE: Multiplanar, multiecho pulse sequences of the brain and surrounding structures were obtained without intravenous contrast. Angiographic images of the head were obtained using MRA technique without contrast.  COMPARISON:  MRI and MRA of the head June 12, 2013  FINDINGS: MRI HEAD FINDINGS  No reduced diffusion to suggest acute ischemia. Less than 5 supra and infratentorial punctate foci of susceptibility artifact were present on prior imaging. No midline shift or mass effect. The ventricles and sulci are normal for patient's age. A few scattered subcentimeter supratentorial white matter T2 hyperintensities are less than expected for age.  No abnormal extra-axial fluid collections. RIGHT ocular lens implant. Paranasal sinusitis with sphenoid air-fluid level, new. Under pneumatized mastoid air cells. No abnormal sellar expansion. No cerebellar tonsillar ectopia. Patient is edentulous.  MRA HEAD FINDINGS  Anterior circulation: Normal flow related enhancement of the included cervical, petrous, cavernous and supra clinoid internal carotid arteries. 2 mm outpouching along inferior aspect of RIGHT cavernous carotid. Patent anterior communicating artery. Normal flow related  enhancement of the anterior and middle cerebral arteries, including more distal segments. 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 Supernumerary anterior cerebral artery arising from RIGHT A1 2 junction.  No large vessel occlusion, hemodynamically significant stenosis, abnormal luminal irregularity.  Posterior circulation: LEFT vertebral artery is dominant. Basilar artery is patent, with normal flow related enhancement of the main branch vessels. Normal proximal flow related enhancement of the posterior cerebral arteries. Similar attenuated distal LEFT posterior cerebral artery flow related enhancement. 2 mm focal outpouching RIGHT P2 3 junction.  No  large vessel occlusion, hemodynamically significant stenosis, abnormal luminal irregularity.  IMPRESSION: MRI HEAD: No acute intracranial process ; normal noncontrast MRI of the brain for age.  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 In addition, 2 mm superiorly directed RIGHT middle cerebral artery bifurcation.  Similarly attenuated distal LEFT posterior cerebral artery. Probable 2 mm aneurysm RIGHT P2 3 junction. For constellation of findings, CT angiogram of the head may be provide greater detail considering diminutive size of the aneurysms.   Electronically Signed   By: Elon Alas   On: 08/02/2014 22:34    Labs:  CBC:  Recent Labs  08/02/14 1437 08/03/14 0430 08/04/14 0313 08/09/14 0636  WBC 8.6 6.1 11.1* 10.6*  HGB 11.0* 10.9* 9.9* 11.1*  HCT 33.4* 34.6* 30.5* 33.7*  PLT 232 271 245 269    COAGS:  Recent Labs  08/03/14 1100 08/09/14 0636  INR 1.08 1.03    BMP:  Recent Labs  08/02/14 1437 08/03/14 0430 08/04/14 0313 08/09/14 0636  NA 139 139 135 140  K 4.0 4.0 4.6 3.7  CL 101 106 104 107  CO2 26 22 22 26   GLUCOSE 103* 154* 133* 116*  BUN 12 15 17 12   CALCIUM 9.6 9.1 8.9 9.0  CREATININE 0.87 1.09 0.94 1.03  GFRNONAA 65* 50* 60* 53*  GFRAA 76*  58* 69* 62*    LIVER FUNCTION TESTS: No results for input(s): BILITOT, AST, ALT, ALKPHOS, PROT, ALBUMIN in the last 8760 hours.  TUMOR MARKERS: No results for input(s): AFPTM, CEA, CA199, CHROMGRNA in the last 8760 hours.  Assessment and Plan: [name] is a 73 y.o. female with history of unstable angina/CAD/CABG, HTN, PVD with prior iliac artery stenting, smoking, hyperlipidemia, and headaches . She also has a strong family hx of cerebral aneurysms. Recent MRA head has revealed 2 mm outpouching of inferior aspect of right cavernous carotid, 2 mm right MCA bifurcation aneurysm and 2 mm right P 2-3 junction outpouching. She presents today for diagnostic cerebral arteriogram for further evaluation. Details/risks of procedure d/w pt/family with their understanding and consent.      SignedAutumn Messing 08/09/2014, 8:48 AM

## 2014-08-09 NOTE — Discharge Instructions (Signed)
Cerebral Angiogram, Care After Refer to this sheet in the next few weeks. These instructions provide you with information on caring for yourself after your procedure. Your health care provider may also give you more specific instructions. Your treatment has been planned according to current medical practices, but problems sometimes occur. Call your health care provider if you have any problems or questions after your procedure. WHAT TO EXPECT AFTER THE PROCEDURE After your procedure, it is typical to have the following:   Small lump at the insertion site.  Tenderness or bruising at the insertion site.  Mild headache. HOME CARE INSTRUCTIONS  Rest at home for the first day after your procedure.  Do not drive or operate heavy machinery as directed by your health care provider.  Do not exercise as directed by your health care provider.  Do not lift objects more than 10 pounds as directed by your health care provider.  Take medicines only as directed by your health care provider.  Follow your health care provider's instructions on:  Incision care.  Bandage (dressing) changes and removal.  Check daily for signs of infection at the insertion site, such as redness, swelling, or discharge.  Drink enough water to keep your urine clear or pale yellow. This will help flush out the contrast dye.  Keep all follow-up visits as directed by your health care provider. This is important. SEEK MEDICAL CARE IF:  You have a fever.  The skin at the insertion site begins to separate.  You have drainage, redness, swelling, or pain at the insertion site.  You are dizzy.  You have a rash.  You are itchy.  You have difficulty urinating. SEEK IMMEDIATE MEDICAL CARE IF:  You have weakness in the face, arms, or legs.   You have difficulty talking or swallowing.   You have vision problems.   You have sudden swelling or pain at the insertion site.  You have a rash.   You have  difficulty using the arm or leg in which the catheter was inserted.   You have chest pain.  You have difficulty breathing. Document Released: 12/14/2013 Document Reviewed: 08/12/2013 [facility] Patient Information 2015 [place]. This information is not intended to replace advice given to you by your health care provider. Make sure you discuss any questions you have with your health care provider.

## 2014-08-13 ENCOUNTER — Emergency Department (HOSPITAL_COMMUNITY)
Admission: EM | Admit: 2014-08-13 | Discharge: 2014-08-14 | Disposition: A | Discharge status: Home / Self Care | Payer: Medicare HMO | Attending: Emergency Medicine | Admitting: Emergency Medicine

## 2014-08-13 ENCOUNTER — Encounter (HOSPITAL_COMMUNITY): Payer: Self-pay | Admitting: Emergency Medicine

## 2014-08-13 DIAGNOSIS — Z4801 Encounter for change or removal of surgical wound dressing: Secondary | ICD-10-CM | POA: Insufficient documentation

## 2014-08-13 DIAGNOSIS — Z4889 Encounter for other specified surgical aftercare: Secondary | ICD-10-CM

## 2014-08-13 DIAGNOSIS — M5481 Occipital neuralgia: Secondary | ICD-10-CM | POA: Diagnosis not present

## 2014-08-13 DIAGNOSIS — I252 Old myocardial infarction: Secondary | ICD-10-CM | POA: Diagnosis not present

## 2014-08-13 DIAGNOSIS — Z87891 Personal history of nicotine dependence: Secondary | ICD-10-CM | POA: Diagnosis not present

## 2014-08-13 DIAGNOSIS — R0602 Shortness of breath: Secondary | ICD-10-CM | POA: Diagnosis not present

## 2014-08-13 DIAGNOSIS — K219 Gastro-esophageal reflux disease without esophagitis: Secondary | ICD-10-CM | POA: Insufficient documentation

## 2014-08-13 DIAGNOSIS — Q0700 Arnold-Chiari syndrome without spina bifida or hydrocephalus: Secondary | ICD-10-CM | POA: Insufficient documentation

## 2014-08-13 DIAGNOSIS — Z7902 Long term (current) use of antithrombotics/antiplatelets: Secondary | ICD-10-CM | POA: Diagnosis not present

## 2014-08-13 DIAGNOSIS — Z951 Presence of aortocoronary bypass graft: Secondary | ICD-10-CM | POA: Insufficient documentation

## 2014-08-13 DIAGNOSIS — Z7951 Long term (current) use of inhaled steroids: Secondary | ICD-10-CM | POA: Insufficient documentation

## 2014-08-13 DIAGNOSIS — I2510 Atherosclerotic heart disease of native coronary artery without angina pectoris: Secondary | ICD-10-CM | POA: Diagnosis not present

## 2014-08-13 DIAGNOSIS — I10 Essential (primary) hypertension: Secondary | ICD-10-CM | POA: Insufficient documentation

## 2014-08-13 DIAGNOSIS — Z9889 Other specified postprocedural states: Secondary | ICD-10-CM | POA: Diagnosis not present

## 2014-08-13 DIAGNOSIS — Z79899 Other long term (current) drug therapy: Secondary | ICD-10-CM | POA: Insufficient documentation

## 2014-08-13 DIAGNOSIS — Z7982 Long term (current) use of aspirin: Secondary | ICD-10-CM | POA: Insufficient documentation

## 2014-08-13 DIAGNOSIS — R05 Cough: Secondary | ICD-10-CM | POA: Insufficient documentation

## 2014-08-13 DIAGNOSIS — J449 Chronic obstructive pulmonary disease, unspecified: Secondary | ICD-10-CM | POA: Insufficient documentation

## 2014-08-13 DIAGNOSIS — E785 Hyperlipidemia, unspecified: Secondary | ICD-10-CM | POA: Diagnosis not present

## 2014-08-13 NOTE — ED Provider Notes (Signed)
CSN: [id number]     Arrival date & time 08/13/14  2317 History   First MD Initiated Contact with Patient 08/13/14 2345     This chart was scribed for Natalie Sorrow, MD by Forrestine Him, ED Scribe. This patient was seen in room D31C/D31C and the patient's care was started 12:16 AM.   Chief Complaint  Patient presents with  . Post-op Problem   The history is provided by the patient. No language interpreter was used.    HPI Comments: [name] is a 74 y.o. female who presents to the Emergency Department here for a post-op issue this evening. Pt has had multiple cardiac catheterization in last 2 weeks with most recent procedure on 12/28 for an angiogram. Aneurysm found but no treatment started at this time. Pt states she has noted a "knot" to the incision site. No other issues at this time. Pt with known allergies to Codeine, Oxycodone-acetaminophen, Risedronate, and Avelox.  Past Medical History  Diagnosis Date  . Asthma   . Reflux   . Hiatal hernia   . Peripheral vascular disease   . Hypertension   . Hyperlipidemia   . Iliac artery aneurysm   . Aortoiliac occlusive disease   . 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 Carotid artery occlusion   . Myocardial infarction 01/01/2000    Cardiac catheterization  . COPD (chronic obstructive pulmonary disease)   . Coronary artery disease   . Arnold-Chiari malformation 1998  . 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  . Gastroesophageal reflux disease    Past Surgical History  Procedure Laterality Date  . Eye surgery      Laser surgery for retinal hemorrhage  . Rotator cuff repair      Right  . Coronary artery bypass graft  01/01/2000  . Post coronary artery  bpg  01/05/2000    Right jugular sheath removed  . Carotid endarterectomy  03/29/2010    Left  CEA  . Pr vein bypass graft,aorto-fem-pop    . Arnold-chiari malformation repair  1998    Suboccipital craniectomy  . Corneal transplant      Right  . Appendectomy    .  Cholecystectomy    . Abdominal hysterectomy    . Left heart catheterization with coronary angiogram N/A 08/03/2014    Procedure: LEFT HEART CATHETERIZATION WITH CORONARY ANGIOGRAM;  Surgeon: Birdie Riddle, MD;  Location: [facility];  Service: Cardiovascular;  Laterality: N/A;   Family History  Problem Relation Age of Onset  . Heart disease Mother     Heart Disease before age 91  . Hypertension Mother   . Hyperlipidemia Mother   . Heart attack Mother   . Clotting disorder Mother   . Heart disease Father     Heart Disease before age 73  . Heart attack Father   . Heart disease Brother     Heart Disease before age 60  . Hyperlipidemia Brother   . Hypertension Brother   . Clotting disorder Brother   . Cerebral aneurysm Sister   . Asthma Sister   . Cerebral aneurysm Brother   . Cancer Brother   . Heart disease Brother   . Heart disease Brother   . Heart disease Brother    History  Substance Use Topics  . Smoking status: Former Smoker -- 1.00 packs/day for 30 years    Types: Cigarettes    Quit date: 08/13/2000  . Smokeless tobacco: Never Used  . Alcohol Use: No   OB History  No data available     Review of Systems  Constitutional: Negative for fever and chills.  HENT: Negative for congestion, rhinorrhea and sore throat.   Respiratory: Positive for cough and shortness of breath.   Cardiovascular: Negative for chest pain.  Gastrointestinal: Negative for nausea, vomiting, abdominal pain and diarrhea.  Genitourinary: Negative for dysuria.  Musculoskeletal: Negative for back pain.  Skin: Negative for rash.  Neurological: Positive for headaches.  Hematological: Does not bruise/bleed easily.  Psychiatric/Behavioral: Negative for confusion.      Allergies  Codeine; Oxycodone-acetaminophen; Risedronate; and Avelox  Home Medications   Prior to Admission medications   Medication Sig Start Date End Date Taking? Authorizing Provider  albuterol (PROVENTIL) (2.5 MG/3ML)  0.083% nebulizer solution Take 2.5 mg by nebulization every 4 (four) hours as needed for wheezing or shortness of breath (((PLAN C))).    Yes Historical Provider, MD  albuterol (VENTOLIN HFA) 108 (90 BASE) MCG/ACT inhaler Inhale 2 puffs into the lungs every 4 (four) hours as needed for wheezing or shortness of breath (((PLAN B))). 08/09/13  Yes Pollie Friar, MD  amLODipine-olmesartan (AZOR) 10-40 MG per tablet Take 1 tablet by mouth daily.   Yes Historical Provider, MD  aspirin EC 81 MG tablet Take 81 mg by mouth daily.    Yes Historical Provider, MD  budesonide (PULMICORT) 0.25 MG/2ML nebulizer solution One twice daily with perforomist Patient taking differently: Take 0.25 mg by nebulization 2 (two) times daily as needed (For shortness of breath.).  11/17/12  Yes Tanda Rockers, MD  cloNIDine (CATAPRES) 0.1 MG tablet Take 0.05 mg by mouth 2 (two) times daily. Take 1 1/2 twice daily   Yes Historical Provider, MD  clopidogrel (PLAVIX) 75 MG tablet Take 1 tablet (75 mg total) by mouth daily. 04/02/12  Yes Conrad , MD  dexlansoprazole (DEXILANT) 60 MG capsule Take 1 capsule (60 mg total) by mouth daily before breakfast. 11/17/12  Yes Tanda Rockers, MD  nitroGLYCERIN (NITRODUR - DOSED IN MG/24 HR) 0.4 mg/hr patch Place 0.4 mg onto the skin daily.  02/01/14  Yes Historical Provider, MD  nitroGLYCERIN (NITROSTAT) 0.4 MG SL tablet Place 0.4 mg under the tongue continuous as needed for chest pain.    Yes Historical Provider, MD  rosuvastatin (CRESTOR) 10 MG tablet Take 10 mg by mouth at bedtime.    Yes Historical Provider, MD  acidophilus (RISAQUAD) CAPS capsule Take 2 capsules by mouth daily. Patient not taking: Reported on 08/02/2014 04/06/13   Donne Hazel, MD  ALPRAZolam Duanne Moron) 0.25 MG tablet Take 0.25 mg by mouth 2 (two) times daily as needed for anxiety.  03/05/14   Historical Provider, MD  cetirizine (ZYRTEC) 1 MG/ML syrup Take 5 mg by mouth daily as needed (For allergies.).     Historical Provider,  MD  gabapentin (NEURONTIN) 100 MG capsule Take 1 capsule (100 mg total) by mouth 3 (three) times daily. Patient not taking: Reported on 08/13/2014 08/04/14   Velta Addison Mikhail, DO  meclizine (ANTIVERT) 25 MG tablet Take 25 mg by mouth 2 (two) times daily as needed for dizziness.  06/28/14   Historical Provider, MD  prednisoLONE acetate (PRED FORTE) 1 % ophthalmic suspension Place 2 drops into the right eye at bedtime. Administer 2 drops to the right eye 2 (two) times a day. 02/26/14   Historical Provider, MD   Triage Vitals: BP 173/71 mmHg  Pulse 65  Temp(Src) 97.9 F (36.6 C) (Oral)  Resp 16  SpO2 98%   Physical Exam  Constitutional: She is oriented to person, place, and time. She appears well-developed and well-nourished. No distress.  HENT:  Head: Normocephalic and atraumatic.  Mouth/Throat: Oropharynx is clear and moist.  Eyes: Conjunctivae and EOM are normal. Pupils are equal, round, and reactive to light.  Sclera clear  Neck: Normal range of motion.  Cardiovascular: Normal rate, regular rhythm and normal heart sounds.   No murmur heard. Pulmonary/Chest: Effort normal and breath sounds normal.  Abdominal: Soft. She exhibits no distension. There is no tenderness.  Genitourinary:  1 plus pulse to L femoral artery Scattered bruising measuring about 4 cm in diameter  Musculoskeletal: Normal range of motion.  Neurological: She is alert and oriented to person, place, and time. No cranial nerve deficit. She exhibits normal muscle tone. Coordination normal.  Skin: Skin is warm and dry.  Psychiatric: She has a normal mood and affect. Judgment normal.  Nursing note and vitals reviewed.   ED Course  Procedures (including critical care time)  ,DIAGNOSTIC STUDIES: Oxygen Saturation is 98% on RA, Normal by my interpretation.    COORDINATION OF CARE: 12:16 AM-Discussed treatment plan with pt at bedside and pt agreed to plan.     Labs Review Labs Reviewed - No data to display  Imaging  Review No results found.   EKG Interpretation None      MDM   Final diagnoses:  Encounter for postoperative wound check    Left groin catheter site with some bruising no evidence of pseudoaneurysm no evidence of hematoma femoral pulses of 1-2+. No significant complications present. No evidence of infection.  I personally performed the services described in this documentation, which was scribed in my presence. The recorded information has been reviewed and is accurate.    Natalie Sorrow, MD 08/14/14 [phone]

## 2014-08-13 NOTE — ED Notes (Signed)
Pt reports she had right groin cardiac cath performed 12/28 and noticed a small  knot in the area today, felt she needed to have it checked.

## 2014-08-13 NOTE — ED Notes (Signed)
Pt. reports " knot" at left groin insertion site of angiogram done last  Monday by Dr. Estanislado Pandy . Denies bleeding or pain .

## 2014-08-14 NOTE — Discharge Instructions (Signed)
Return for any new or worse symptoms. The wound for the catheter site on the left groin without any significant complications at this time. No evidence of infection.

## 2014-08-20 ENCOUNTER — Other Ambulatory Visit (HOSPITAL_COMMUNITY): Payer: Self-pay | Admitting: Interventional Radiology

## 2014-08-20 DIAGNOSIS — I671 Cerebral aneurysm, nonruptured: Secondary | ICD-10-CM

## 2014-09-02 ENCOUNTER — Ambulatory Visit (HOSPITAL_COMMUNITY)
Admission: RE | Admit: 2014-09-02 | Discharge: 2014-09-02 | Disposition: A | Discharge status: Home / Self Care | Payer: Medicare HMO | Source: Ambulatory Visit | Attending: Interventional Radiology | Admitting: Interventional Radiology

## 2014-09-02 DIAGNOSIS — I671 Cerebral aneurysm, nonruptured: Secondary | ICD-10-CM

## 2014-12-03 ENCOUNTER — Other Ambulatory Visit (HOSPITAL_COMMUNITY): Payer: Self-pay | Admitting: Interventional Radiology

## 2014-12-03 ENCOUNTER — Telehealth (HOSPITAL_COMMUNITY): Payer: Self-pay | Admitting: Interventional Radiology

## 2014-12-03 DIAGNOSIS — R51 Headache: Secondary | ICD-10-CM

## 2014-12-03 DIAGNOSIS — I671 Cerebral aneurysm, nonruptured: Secondary | ICD-10-CM

## 2014-12-03 DIAGNOSIS — R519 Headache, unspecified: Secondary | ICD-10-CM

## 2014-12-03 NOTE — Telephone Encounter (Signed)
Called pt, left VM for her to call to schedule MRI/MRA. JM

## 2014-12-09 ENCOUNTER — Other Ambulatory Visit (HOSPITAL_COMMUNITY): Payer: Medicare HMO

## 2014-12-09 ENCOUNTER — Ambulatory Visit (HOSPITAL_COMMUNITY)
Admission: RE | Admit: 2014-12-09 | Discharge: 2014-12-09 | Disposition: A | Discharge status: Home / Self Care | Payer: Medicare HMO | Source: Ambulatory Visit | Attending: Interventional Radiology | Admitting: Interventional Radiology

## 2014-12-09 DIAGNOSIS — I671 Cerebral aneurysm, nonruptured: Secondary | ICD-10-CM | POA: Diagnosis not present

## 2014-12-09 DIAGNOSIS — R519 Headache, unspecified: Secondary | ICD-10-CM

## 2014-12-09 DIAGNOSIS — R51 Headache: Secondary | ICD-10-CM | POA: Insufficient documentation

## 2014-12-09 DIAGNOSIS — R42 Dizziness and giddiness: Secondary | ICD-10-CM | POA: Diagnosis not present

## 2014-12-09 LAB — POCT I-STAT CREATININE: Creatinine, Ser: 1 mg/dL (ref 0.50–1.10)

## 2014-12-09 MED ORDER — GADOBENATE DIMEGLUMINE 529 MG/ML IV SOLN
15.0000 mL | Freq: Once | INTRAVENOUS | Status: AC | PRN
  Administered 2014-12-09: 15 mL via INTRAVENOUS

## 2014-12-14 ENCOUNTER — Telehealth (HOSPITAL_COMMUNITY): Payer: Self-pay | Admitting: Interventional Radiology

## 2014-12-14 NOTE — Telephone Encounter (Signed)
Called pt, told her that per Deveshwar her MRI/MRA results were unchanged. Also told her that per Deveshwar she should see her PCP concerning her headaches. Patient states good understanding and is in agreement wit this plan of care. JM

## 2014-12-15 ENCOUNTER — Other Ambulatory Visit (HOSPITAL_COMMUNITY): Payer: Medicare HMO

## 2014-12-15 ENCOUNTER — Ambulatory Visit: Payer: Medicare HMO | Admitting: Family

## 2014-12-22 ENCOUNTER — Ambulatory Visit (HOSPITAL_COMMUNITY): Payer: Medicare HMO

## 2014-12-29 ENCOUNTER — Other Ambulatory Visit: Payer: Self-pay | Admitting: Family Medicine

## 2014-12-29 DIAGNOSIS — E2839 Other primary ovarian failure: Secondary | ICD-10-CM

## 2014-12-31 ENCOUNTER — Ambulatory Visit
Admission: RE | Admit: 2014-12-31 | Discharge: 2014-12-31 | Disposition: A | Discharge status: Home / Self Care | Payer: Commercial Managed Care - HMO | Source: Ambulatory Visit | Attending: Family Medicine | Admitting: Family Medicine

## 2014-12-31 DIAGNOSIS — E2839 Other primary ovarian failure: Secondary | ICD-10-CM

## 2015-01-03 ENCOUNTER — Encounter: Payer: Self-pay | Admitting: Family

## 2015-01-04 ENCOUNTER — Encounter: Payer: Self-pay | Admitting: Family

## 2015-01-05 ENCOUNTER — Encounter: Payer: Self-pay | Admitting: Family

## 2015-01-05 ENCOUNTER — Ambulatory Visit (INDEPENDENT_AMBULATORY_CARE_PROVIDER_SITE_OTHER): Payer: Medicare HMO | Admitting: Family

## 2015-01-05 ENCOUNTER — Ambulatory Visit (HOSPITAL_COMMUNITY)
Admission: RE | Admit: 2015-01-05 | Discharge: 2015-01-05 | Disposition: A | Discharge status: Home / Self Care | Payer: Medicare HMO | Source: Ambulatory Visit | Attending: Family | Admitting: Family

## 2015-01-05 VITALS — BP 130/62 | HR 55 | Resp 14 | Ht 63.75 in | Wt 177.0 lb

## 2015-01-05 DIAGNOSIS — I6521 Occlusion and stenosis of right carotid artery: Secondary | ICD-10-CM | POA: Diagnosis not present

## 2015-01-05 DIAGNOSIS — I6529 Occlusion and stenosis of unspecified carotid artery: Secondary | ICD-10-CM

## 2015-01-05 DIAGNOSIS — M542 Cervicalgia: Secondary | ICD-10-CM | POA: Diagnosis not present

## 2015-01-05 DIAGNOSIS — E669 Obesity, unspecified: Secondary | ICD-10-CM

## 2015-01-05 DIAGNOSIS — Z9889 Other specified postprocedural states: Secondary | ICD-10-CM

## 2015-01-05 NOTE — Addendum Note (Signed)
Addended by: Mena Goes on: 01/05/2015 11:07 AM   Modules accepted: Orders

## 2015-01-05 NOTE — Progress Notes (Signed)
Established Carotid Patient   History of Present Illness  [name] is a 74 y.o. female patient of Dr. Scot Dock who is s/p left carotid endarterectomy in August of 2011. We have been following a moderate right carotid stenosis. She comes in for a yearly follow up visit.   Patient has Negative history of TIA or stroke symptom. The patient denies amaurosis fugax or monocular blindness. The patient denies facial drooping. Pt. denies hemiplegia. The patient denies receptive or expressive aphasia.   She denies claudication type symptoms in her legs with walking, denies non healing wounds. She does complain that her legs feel like they will not hold her up when standing. She has had a syncopal episode in 1998, lost vision temporarily, and May 2015 she had a similar pre-syncopal episode; she continues to have recurring dizziness. Patient denies that dizziness is worse with raising arms above head. Patient denies tingling, numbness, or weakness in either hand or arm. Has been seeing Dr. Jannifer Franklin s/p craniotomy for Arnold-Chiari malformation. Had a head MRI and MRA of her head, requested by Dr. Whitney Muse, interventional radiologist, per pt. In February or March 2016 due to pt's son, brother, and sister having cerebral aneurysms. Son had ruptured cerebral aneurysm and stroke in September 2015. Was hospitalized at [facility] Dec., 2014 for dizziness, falling, evaluation was inconclusive per pt.  Pt Diabetic: No Pt smoker: non-smoker  Pt meds include: Statin : Yes ASA: Yes,  81 mg Other anticoagulants/antiplatelets: Plavix  Past Medical History  Diagnosis Date  . Asthma   . Reflux   . Hiatal hernia   . Peripheral vascular disease   . Hypertension   . Hyperlipidemia   . Iliac artery aneurysm   . Aortoiliac occlusive disease   . 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 Carotid artery occlusion   . Myocardial infarction 01/01/2000    Cardiac catheterization  . COPD (chronic  obstructive pulmonary disease)   . Coronary artery disease   . Arnold-Chiari malformation 1998  . 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  . Gastroesophageal reflux disease     Social History History  Substance Use Topics  . Smoking status: Former Smoker -- 1.00 packs/day for 30 years    Types: Cigarettes    Quit date: 08/13/2000  . Smokeless tobacco: Never Used  . Alcohol Use: No    Family History Family History  Problem Relation Age of Onset  . Heart disease Mother     Heart Disease before age 72  . Hypertension Mother   . Hyperlipidemia Mother   . Heart attack Mother   . Clotting disorder Mother   . Heart disease Father     Heart Disease before age 85  . Heart attack Father   . Heart disease Brother     Heart Disease before age 37  . Hyperlipidemia Brother   . Hypertension Brother   . Clotting disorder Brother   . Cerebral aneurysm Sister   . Asthma Sister   . Cerebral aneurysm Brother   . Cancer Brother   . Heart disease Brother   . Heart disease Brother   . Heart disease Brother     Surgical History Past Surgical History  Procedure Laterality Date  . Eye surgery      Laser surgery for retinal hemorrhage  . Rotator cuff repair      Right  . Coronary artery bypass graft  01/01/2000  . Post coronary artery  bpg  01/05/2000    Right jugular sheath removed  .  Carotid endarterectomy  03/29/2010    Left  CEA  . Pr vein bypass graft,aorto-fem-pop    . Arnold-chiari malformation repair  1998    Suboccipital craniectomy  . Corneal transplant      Right  . Appendectomy    . Cholecystectomy    . Abdominal hysterectomy    . Left heart catheterization with coronary angiogram N/A 08/03/2014    Procedure: LEFT HEART CATHETERIZATION WITH CORONARY ANGIOGRAM;  Surgeon: Birdie Riddle, MD;  Location: [facility];  Service: Cardiovascular;  Laterality: N/A;    Allergies  Allergen Reactions  . Codeine Other (See Comments)    Dr. Terrence Dupont advised patient not to take  this medication  . Oxycodone-Acetaminophen Other (See Comments)    Says it makes her feel weird  . Risedronate Other (See Comments)    Chest pain  . Avelox [Moxifloxacin Hcl In Nacl] Palpitations    Current Outpatient Prescriptions  Medication Sig Dispense Refill  . acidophilus (RISAQUAD) CAPS capsule Take 2 capsules by mouth daily. (Patient not taking: Reported on 08/02/2014) 40 capsule 0  . albuterol (PROVENTIL) (2.5 MG/3ML) 0.083% nebulizer solution Take 2.5 mg by nebulization every 4 (four) hours as needed for wheezing or shortness of breath (((PLAN C))).     . albuterol (VENTOLIN HFA) 108 (90 BASE) MCG/ACT inhaler Inhale 2 puffs into the lungs every 4 (four) hours as needed for wheezing or shortness of breath (((PLAN B))). 1 Inhaler 11  . ALPRAZolam (XANAX) 0.25 MG tablet Take 0.25 mg by mouth 2 (two) times daily as needed for anxiety.     Marland Kitchen amLODipine-olmesartan (AZOR) 10-40 MG per tablet Take 1 tablet by mouth daily.    Marland Kitchen aspirin EC 81 MG tablet Take 81 mg by mouth daily.     . budesonide (PULMICORT) 0.25 MG/2ML nebulizer solution One twice daily with perforomist (Patient taking differently: Take 0.25 mg by nebulization 2 (two) times daily as needed (For shortness of breath.). ) 120 mL 12  . cetirizine (ZYRTEC) 1 MG/ML syrup Take 5 mg by mouth daily as needed (For allergies.).     Marland Kitchen cloNIDine (CATAPRES) 0.1 MG tablet Take 0.05 mg by mouth 2 (two) times daily. Take 1 1/2 twice daily    . clopidogrel (PLAVIX) 75 MG tablet Take 1 tablet (75 mg total) by mouth daily. 90 tablet 3  . dexlansoprazole (DEXILANT) 60 MG capsule Take 1 capsule (60 mg total) by mouth daily before breakfast. 30 capsule 11  . gabapentin (NEURONTIN) 100 MG capsule Take 1 capsule (100 mg total) by mouth 3 (three) times daily. (Patient not taking: Reported on 08/13/2014) 90 capsule 0  . meclizine (ANTIVERT) 25 MG tablet Take 25 mg by mouth 2 (two) times daily as needed for dizziness.   1  . nitroGLYCERIN (NITRODUR - DOSED  IN MG/24 HR) 0.4 mg/hr patch Place 0.4 mg onto the skin daily.     . nitroGLYCERIN (NITROSTAT) 0.4 MG SL tablet Place 0.4 mg under the tongue continuous as needed for chest pain.     . prednisoLONE acetate (PRED FORTE) 1 % ophthalmic suspension Place 2 drops into the right eye at bedtime. Administer 2 drops to the right eye 2 (two) times a day.    . rosuvastatin (CRESTOR) 10 MG tablet Take 10 mg by mouth at bedtime.      No current facility-administered medications for this visit.    Review of Systems : See HPI for pertinent positives and negatives.  Physical Examination  Filed Vitals:  01/05/15 0935 01/05/15 0939  BP: 111/52 130/62  Pulse: 55 55  Resp:  14  Height:  5' 3.75" (1.619 m)  Weight:  177 lb (80.287 kg)  SpO2:  98%   Body mass index is 30.63 kg/(m^2).   General: WDWN obese female in NAD GAIT: normal Eyes: PERRLA Pulmonary: Non-labored, CTAB, Negative Rales, Negative rhonchi, & Negative wheezing.  Cardiac: regular Rhythm, no detected murmur.  VASCULAR EXAM Carotid Bruits Left Right   positive negative   Aorta is not palpable. Radial pulses are 1+ right and 2+ left palpable.      LE Pulses LEFT RIGHT   POPLITEAL not palpable  not palpable   POSTERIOR TIBIAL not palpable  not palpable    DORSALIS PEDIS  ANTERIOR TIBIAL 2+ palpable  2+ palpable     Gastrointestinal: soft, nontender, BS WNL, no r/g,no palpable masses.  Musculoskeletal: Negative muscle atrophy/wasting. M/S 5/5 throughout, Extremities without ischemic changes.  Neurologic: A&O X 3; Appropriate Affect, Speech is normal CN 2-12 intact, Pain and light touch intact in extremities, Motor exam as listed above.         Non-Invasive Vascular Imaging CAROTID DUPLEX 01/05/2015   CEREBROVASCULAR DUPLEX EVALUATION     INDICATION: Follow-up carotid disease     PREVIOUS INTERVENTION(S): Left carotid endarterectomy 03/29/2010    DUPLEX EXAM:     RIGHT  LEFT  Peak Systolic Velocities (cm/s) End Diastolic Velocities (cm/s) Plaque LOCATION Peak Systolic Velocities (cm/s) End Diastolic Velocities (cm/s) Plaque  133 13  CCA PROXIMAL 108 26   86 17  CCA MID 98 16   83 17  CCA DISTAL 69 14   119 11 HT ECA 119 9   175 45 HT ICA PROXIMAL 124 20   156 38 HT ICA MID 133 30   121 33  ICA DISTAL 146 41     2.1 ICA / CCA Ratio (PSV) NA  Antegrade  Vertebral Flow Antegrade    Brachial Systolic Pressure (mmHg)   Within normal limits  Brachial Artery Waveforms Within normal limits     Plaque Morphology:  HM = Homogeneous, HT = Heterogeneous, CP = Calcific Plaque, SP = Smooth Plaque, IP = Irregular Plaque  ADDITIONAL FINDINGS:     IMPRESSION: 1. Evidence of 50%-69% stenosis of the right internal carotid artery. 2. Widely patent left carotid endarterectomy without evidence of restenosis or hyperplasia. 3. Bilateral vertebral artery is antegrade.     Compared to the previous exam:  Velocities of the right internal carotid artery are stable; however, due to changes in lab criteria, the category has increased.      Assessment: [name] is a 74 y.o. female  who is s/p left carotid endarterectomy in August of 2011. She has no history of stroke or TIA but does have recurrent dizziness that is not of extracranial carotid artery or extracranial vertebral artery origin according to serial extracranial carotid and vertebral artery Duplex studies. She sees an interventional radiologist and neurologist.  Today's carotid Duplex suggests  50%-69% stenosis of the right internal carotid artery and widely patent left carotid endarterectomy without evidence of restenosis or hyperplasia. Velocities of the right internal carotid artery are stable; however, due to changes in lab criteria, the category has  increased.   Plan: Follow-up in 6 months with Carotid Duplex.   I discussed in depth with the patient the nature of atherosclerosis, and emphasized the importance of maximal medical management including strict control of blood pressure, blood glucose, and lipid levels,  obtaining regular exercise, and continued cessation of smoking.  The patient is aware that without maximal medical management the underlying atherosclerotic disease process will progress, limiting the benefit of any interventions. The patient was given information about stroke prevention and what symptoms should prompt the patient to seek immediate medical care. Thank you for allowing [place] to participate in this patient's care.  Clemon Chambers, RN, MSN, FNP-C [facility] Office: [phone]  Clinic Physician: Scot Dock  01/05/2015 9:39 AM

## 2015-01-05 NOTE — Patient Instructions (Signed)
Stroke Prevention Some medical conditions and behaviors are associated with an increased chance of having a stroke. You may prevent a stroke by making healthy choices and managing medical conditions. HOW CAN I REDUCE MY RISK OF HAVING A STROKE?   Stay physically active. Get at least 30 minutes of activity on most or all days.  Do not smoke. It may also be helpful to avoid exposure to secondhand smoke.  Limit alcohol use. Moderate alcohol use is considered to be:  No more than 2 drinks per day for men.  No more than 1 drink per day for nonpregnant women.  Eat healthy foods. This involves:  Eating 5 or more servings of fruits and vegetables a day.  Making dietary changes that address high blood pressure (hypertension), high cholesterol, diabetes, or obesity.  Manage your cholesterol levels.  Making food choices that are high in fiber and low in saturated fat, trans fat, and cholesterol may control cholesterol levels.  Take any prescribed medicines to control cholesterol as directed by your health care provider.  Manage your diabetes.  Controlling your carbohydrate and sugar intake is recommended to manage diabetes.  Take any prescribed medicines to control diabetes as directed by your health care provider.  Control your hypertension.  Making food choices that are low in salt (sodium), saturated fat, trans fat, and cholesterol is recommended to manage hypertension.  Take any prescribed medicines to control hypertension as directed by your health care provider.  Maintain a healthy weight.  Reducing calorie intake and making food choices that are low in sodium, saturated fat, trans fat, and cholesterol are recommended to manage weight.  Stop drug abuse.  Avoid taking birth control pills.  Talk to your health care provider about the risks of taking birth control pills if you are over 35 years old, smoke, get migraines, or have ever had a blood clot.  Get evaluated for sleep  disorders (sleep apnea).  Talk to your health care provider about getting a sleep evaluation if you snore a lot or have excessive sleepiness.  Take medicines only as directed by your health care provider.  For some people, aspirin or blood thinners (anticoagulants) are helpful in reducing the risk of forming abnormal blood clots that can lead to stroke. If you have the irregular heart rhythm of atrial fibrillation, you should be on a blood thinner unless there is a good reason you cannot take them.  Understand all your medicine instructions.  Make sure that other conditions (such as anemia or atherosclerosis) are addressed. SEEK IMMEDIATE MEDICAL CARE IF:   You have sudden weakness or numbness of the face, arm, or leg, especially on one side of the body.  Your face or eyelid droops to one side.  You have sudden confusion.  You have trouble speaking (aphasia) or understanding.  You have sudden trouble seeing in one or both eyes.  You have sudden trouble walking.  You have dizziness.  You have a loss of balance or coordination.  You have a sudden, severe headache with no known cause.  You have new chest pain or an irregular heartbeat. Any of these symptoms may represent a serious problem that is an emergency. Do not wait to see if the symptoms will go away. Get medical help at once. Call your local emergency services (911 in [place] Do not drive yourself to the hospital. Document Released: 09/06/2004 Document Revised: 12/14/2013 Document Reviewed: 01/30/2013 Exit[employer] Patient Information 2015 [employer]. This information is not intended to replace advice given   to you by your health care provider. Make sure you discuss any questions you have with your health care provider.  

## 2015-01-28 ENCOUNTER — Other Ambulatory Visit (HOSPITAL_COMMUNITY): Payer: Self-pay | Admitting: Family Medicine

## 2015-01-28 DIAGNOSIS — Z1231 Encounter for screening mammogram for malignant neoplasm of breast: Secondary | ICD-10-CM

## 2015-02-04 ENCOUNTER — Ambulatory Visit (HOSPITAL_COMMUNITY)
Admission: RE | Admit: 2015-02-04 | Discharge: 2015-02-04 | Disposition: A | Discharge status: Home / Self Care | Payer: Medicare HMO | Source: Ambulatory Visit | Attending: Family Medicine | Admitting: Family Medicine

## 2015-02-04 DIAGNOSIS — Z1231 Encounter for screening mammogram for malignant neoplasm of breast: Secondary | ICD-10-CM

## 2015-02-09 ENCOUNTER — Emergency Department (HOSPITAL_COMMUNITY): Payer: Medicare HMO

## 2015-02-09 ENCOUNTER — Emergency Department (HOSPITAL_COMMUNITY)
Admission: EM | Admit: 2015-02-09 | Discharge: 2015-02-09 | Disposition: A | Discharge status: Home / Self Care | Payer: Medicare HMO | Attending: Emergency Medicine | Admitting: Emergency Medicine

## 2015-02-09 ENCOUNTER — Encounter (HOSPITAL_COMMUNITY): Payer: Self-pay | Admitting: Emergency Medicine

## 2015-02-09 DIAGNOSIS — Z7902 Long term (current) use of antithrombotics/antiplatelets: Secondary | ICD-10-CM | POA: Diagnosis not present

## 2015-02-09 DIAGNOSIS — I252 Old myocardial infarction: Secondary | ICD-10-CM | POA: Diagnosis not present

## 2015-02-09 DIAGNOSIS — J449 Chronic obstructive pulmonary disease, unspecified: Secondary | ICD-10-CM | POA: Diagnosis not present

## 2015-02-09 DIAGNOSIS — K219 Gastro-esophageal reflux disease without esophagitis: Secondary | ICD-10-CM | POA: Diagnosis not present

## 2015-02-09 DIAGNOSIS — Z79899 Other long term (current) drug therapy: Secondary | ICD-10-CM | POA: Diagnosis not present

## 2015-02-09 DIAGNOSIS — E785 Hyperlipidemia, unspecified: Secondary | ICD-10-CM | POA: Diagnosis not present

## 2015-02-09 DIAGNOSIS — R51 Headache: Secondary | ICD-10-CM | POA: Insufficient documentation

## 2015-02-09 DIAGNOSIS — Z87891 Personal history of nicotine dependence: Secondary | ICD-10-CM | POA: Insufficient documentation

## 2015-02-09 DIAGNOSIS — Z7952 Long term (current) use of systemic steroids: Secondary | ICD-10-CM | POA: Diagnosis not present

## 2015-02-09 DIAGNOSIS — I10 Essential (primary) hypertension: Secondary | ICD-10-CM | POA: Insufficient documentation

## 2015-02-09 DIAGNOSIS — Z7982 Long term (current) use of aspirin: Secondary | ICD-10-CM | POA: Insufficient documentation

## 2015-02-09 DIAGNOSIS — I2510 Atherosclerotic heart disease of native coronary artery without angina pectoris: Secondary | ICD-10-CM | POA: Insufficient documentation

## 2015-02-09 DIAGNOSIS — R519 Headache, unspecified: Secondary | ICD-10-CM

## 2015-02-09 DIAGNOSIS — I729 Aneurysm of unspecified site: Secondary | ICD-10-CM

## 2015-02-09 LAB — I-STAT CHEM 8, ED
BUN: 14 mg/dL (ref 6–20)
CHLORIDE: 104 mmol/L (ref 101–111)
Calcium, Ion: 1.22 mmol/L (ref 1.13–1.30)
Creatinine, Ser: 1 mg/dL (ref 0.44–1.00)
Glucose, Bld: 119 mg/dL — ABNORMAL HIGH (ref 65–99)
HCT: 38 % (ref 36.0–46.0)
Hemoglobin: 12.9 g/dL (ref 12.0–15.0)
Potassium: 4.5 mmol/L (ref 3.5–5.1)
SODIUM: 139 mmol/L (ref 135–145)
TCO2: 25 mmol/L (ref 0–100)

## 2015-02-09 LAB — BASIC METABOLIC PANEL
ANION GAP: 7 (ref 5–15)
BUN: 12 mg/dL (ref 6–20)
CHLORIDE: 104 mmol/L (ref 101–111)
CO2: 28 mmol/L (ref 22–32)
CREATININE: 0.92 mg/dL (ref 0.44–1.00)
Calcium: 9.4 mg/dL (ref 8.9–10.3)
GFR calc Af Amer: >60 mL/min (ref >60)
GFR calc non Af Amer: >60 mL/min (ref >60)
GLUCOSE: 123 mg/dL — AB (ref 65–99)
POTASSIUM: 4.5 mmol/L (ref 3.5–5.1)
Sodium: 139 mmol/L (ref 135–145)

## 2015-02-09 LAB — CBC
HCT: 35.9 % — ABNORMAL LOW (ref 36.0–46.0)
Hemoglobin: 12 g/dL (ref 12.0–15.0)
MCH: 28 pg (ref 26.0–34.0)
MCHC: 33.4 g/dL (ref 30.0–36.0)
MCV: 83.7 fL (ref 78.0–100.0)
Platelets: 276 10*3/uL (ref 150–400)
RBC: 4.29 MIL/uL (ref 3.87–5.11)
RDW: 14.1 % (ref 11.5–15.5)
WBC: 7.9 10*3/uL (ref 4.0–10.5)

## 2015-02-09 MED ORDER — ACETAMINOPHEN 325 MG PO TABS: 650.0000 mg | ORAL_TABLET | Freq: Four times a day (QID) | ORAL | Status: DC | PRN

## 2015-02-09 MED ORDER — SODIUM CHLORIDE 0.9 % IV BOLUS (SEPSIS)
1000.0000 mL | Freq: Once | INTRAVENOUS | Status: AC
  Administered 2015-02-09: 1000 mL via INTRAVENOUS

## 2015-02-09 MED ORDER — FENTANYL CITRATE (PF) 100 MCG/2ML IJ SOLN: 25.0000 ug | Freq: Once | INTRAMUSCULAR | Status: DC

## 2015-02-09 MED ORDER — ACETAMINOPHEN 325 MG PO TABS
650.0000 mg | ORAL_TABLET | Freq: Once | ORAL | Status: AC
  Administered 2015-02-09: 650 mg via ORAL
  Filled 2015-02-09: qty 2

## 2015-02-09 MED ORDER — GADOBENATE DIMEGLUMINE 529 MG/ML IV SOLN: 15.0000 mL | Freq: Once | INTRAVENOUS | Status: DC | PRN

## 2015-02-09 NOTE — ED Notes (Signed)
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

## 2015-02-09 NOTE — ED Notes (Signed)
Pt in MRI.

## 2015-02-09 NOTE — Discharge Instructions (Signed)
As discussed, your evaluation today has been largely reassuring.  But, it is important that you monitor your condition carefully, and do not hesitate to return to the ED if you develop new, or concerning changes in your condition. ? ?Otherwise, please follow-up with your physician for appropriate ongoing care. ? ?

## 2015-02-09 NOTE — ED Provider Notes (Signed)
18:30- reevaluation for Dr. Vanita Panda at his request, following MRI imaging.  At this time. The patient is comfortable and cooperative. She denies headache at this time. She states that her headache has been intermittent, for 1 month.. Usually, posterior, lasting about a minute, and occurs at various times, for 3 weeks. There are no known provocations. She is more concerned about a numb feeling in her head that is worse when she is standing and always goes away when she lies down. The numbness is a generalized feeling, and last for several hours. She denies neck pain, back pain, nausea or vomiting. She has right ear discomfort that is felt as a "crackling sensation". She denies tinnitus or loss of hearing.  The patient has an appointment with her neurologist, tomorrow morning.  Right ear- TM is somewhat dull in appearance and has normal landmarks.  Brief neurologic exam- no dysarthria and aphasia or nystagmus. Normal movement arms and legs bilaterally.  Assessment- nonspecific cephalization, and sensation of intracranial numbness. I do not believe that this represents a complication related to her known intracranial aneurysm, or a new acute CNS event.  Radiologic imaging report reviewed and images by MRI  - viewed, by me.  Mr [name] Head Wo Contrast  02/09/2015   CLINICAL DATA:  Patient complains of severe headache and weakness. Symptoms for 1 week. History of brain aneurysm.  EXAM: MRI HEAD WITHOUT AND WITH CONTRAST  MRA HEAD WITHOUT CONTRAST  TECHNIQUE: Multiplanar, multiecho pulse sequences of the brain and surrounding structures were obtained without and with intravenous contrast. Angiographic images of the head were obtained using MRA technique without contrast.  CONTRAST:  MultiHance 14 mL.  COMPARISON:  MR brain 12/09/2014.  FINDINGS: MRI HEAD FINDINGS  No evidence for acute stroke, intracranial mass lesion, hydrocephalus, or extra-axial fluid.  Acute hemorrhage, particularly subarachnoid  hemorrhage, is difficult to detect on MR. There is turbulent CSF flow demonstrated on FLAIR imaging in the prepontine cistern and also in the interhemispheric fissure. This could mask or even represent small amounts of subarachnoid hemorrhage. Noncontrast CT of the head is recommended for further evaluation to exclude acute subarachnoid blood.  Mild atrophy.  Mild small vessel disease.  Tiny focus of chronic hemorrhage RIGHT posterior temporal subcortical white matter, non worrisome. Tiny foci of chronic hemorrhage in the posterior fossa, felt secondary to suboccipital decompression. Mild small vessel disease. Prior suboccipital decompression for Chiari malformation. Normal pituitary and cerebellar tonsils otherwise.  Post infusion, no abnormal enhancement of the brain or meninges. Visualized calvarium, skull base, and upper cervical osseous structures unremarkable. Scalp and extracranial soft tissues, orbits, sinuses, and mastoids show no acute process. Mild chronic sinus disease. RIGHT cataract excision. Underdeveloped mastoids.  MRA HEAD FINDINGS  Internal carotid arteries are widely patent. Basilar artery widely patent. Both vertebrals are patent, with LEFT dominant.  Stable 2 mm outpouching RIGHT MCA bifurcation. Stable 2 mm outpouching or infundibulum LEFT cavernous carotid. Median artery of the corpus callosum is incidentally noted, normal variant.  IMPRESSION: MRI of the brain demonstrates no acute stroke, parenchymal hemorrhage, or hydrocephalus.  Acute subarachnoid hemorrhage is not well visualized on MR. Noncontrast CT of the head is recommended for further evaluation in the setting of severe headache with known aneurysm.  Unremarkable MRA intracranial.  Unchanged and unremarkable MRA intracranial with no enlargement of the previously identified small outpouchings.   Electronically Signed   By: Staci Righter M.D.   On: 02/09/2015 18:09   Mr Jeri Cos OE Contrast  02/09/2015  CLINICAL DATA:  Patient  complains of severe headache and weakness. Symptoms for 1 week. History of brain aneurysm.  EXAM: MRI HEAD WITHOUT AND WITH CONTRAST  MRA HEAD WITHOUT CONTRAST  TECHNIQUE: Multiplanar, multiecho pulse sequences of the brain and surrounding structures were obtained without and with intravenous contrast. Angiographic images of the head were obtained using MRA technique without contrast.  CONTRAST:  MultiHance 14 mL.  COMPARISON:  MR brain 12/09/2014.  FINDINGS: MRI HEAD FINDINGS  No evidence for acute stroke, intracranial mass lesion, hydrocephalus, or extra-axial fluid.  Acute hemorrhage, particularly subarachnoid hemorrhage, is difficult to detect on MR. There is turbulent CSF flow demonstrated on FLAIR imaging in the prepontine cistern and also in the interhemispheric fissure. This could mask or even represent small amounts of subarachnoid hemorrhage. Noncontrast CT of the head is recommended for further evaluation to exclude acute subarachnoid blood.  Mild atrophy.  Mild small vessel disease.  Tiny focus of chronic hemorrhage RIGHT posterior temporal subcortical white matter, non worrisome. Tiny foci of chronic hemorrhage in the posterior fossa, felt secondary to suboccipital decompression. Mild small vessel disease. Prior suboccipital decompression for Chiari malformation. Normal pituitary and cerebellar tonsils otherwise.  Post infusion, no abnormal enhancement of the brain or meninges. Visualized calvarium, skull base, and upper cervical osseous structures unremarkable. Scalp and extracranial soft tissues, orbits, sinuses, and mastoids show no acute process. Mild chronic sinus disease. RIGHT cataract excision. Underdeveloped mastoids.  MRA HEAD FINDINGS  Internal carotid arteries are widely patent. Basilar artery widely patent. Both vertebrals are patent, with LEFT dominant.  Stable 2 mm outpouching RIGHT MCA bifurcation. Stable 2 mm outpouching or infundibulum LEFT cavernous carotid. Median artery of the  corpus callosum is incidentally noted, normal variant.  IMPRESSION: MRI of the brain demonstrates no acute stroke, parenchymal hemorrhage, or hydrocephalus.  Acute subarachnoid hemorrhage is not well visualized on MR. Noncontrast CT of the head is recommended for further evaluation in the setting of severe headache with known aneurysm.  Unremarkable MRA intracranial.  Unchanged and unremarkable MRA intracranial with no enlargement of the previously identified small outpouchings.   Electronically Signed   By: Staci Righter M.D.   On: 02/09/2015 18:09   Nursing Notes Reviewed/ Care Coordinated Applicable Imaging Reviewed Interpretation of Laboratory Data incorporated into ED treatment  Plan- follow-up with neurology, tomorrow, as scheduled.  Daleen Bo, MD 02/09/15 [phone]

## 2015-02-09 NOTE — ED Provider Notes (Signed)
CSN: [id number]     Arrival date & time 02/09/15  1216 History   First MD Initiated Contact with Patient 02/09/15 1457     Chief Complaint  Patient presents with  . Headache     (Consider location/radiation/quality/duration/timing/severity/associated sxs/prior Treatment) HPI Patient presents with concern of worsening headache, ongoing nausea, vomiting. Patient's multiple medical issues, including prior headache, Chiari malformation, known cerebral aneurysm, as well as atherosclerotic disease, with substantial coronary disease. Patient states that she has headaches for a long time, but over the past week or so, headache has become worse, is primarily in the superior pole of the scalp. There is associated increased nausea, multiple episodes of vomiting. Transient visual changes, though no complete visual loss, no blurry vision. No asymmetric weakness, no falling, no syncope, no chest pain, no dyspnea. No relief with anything. Activity worsens the conditions.  Past Medical History  Diagnosis Date  . Asthma   . Reflux   . Hiatal hernia   . Peripheral vascular disease   . Hypertension   . Hyperlipidemia   . Iliac artery aneurysm   . Aortoiliac occlusive disease   . 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 Carotid artery occlusion   . Myocardial infarction 01/01/2000    Cardiac catheterization  . COPD (chronic obstructive pulmonary disease)   . Coronary artery disease   . Arnold-Chiari malformation 1998  . 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  . Gastroesophageal reflux disease    Past Surgical History  Procedure Laterality Date  . Eye surgery      Laser surgery for retinal hemorrhage  . Rotator cuff repair      Right  . Coronary artery bypass graft  01/01/2000  . Post coronary artery  bpg  01/05/2000    Right jugular sheath removed  . Carotid endarterectomy  03/29/2010    Left  CEA  . Pr vein bypass graft,aorto-fem-pop    . Arnold-chiari malformation repair  1998     Suboccipital craniectomy  . Corneal transplant      Right  . Appendectomy    . Abdominal hysterectomy    . Left heart catheterization with coronary angiogram N/A 08/03/2014    Procedure: LEFT HEART CATHETERIZATION WITH CORONARY ANGIOGRAM;  Surgeon: Birdie Riddle, MD;  Location: [facility];  Service: Cardiovascular;  Laterality: N/A;  . Cholecystectomy      Gall Bladder   Family History  Problem Relation Age of Onset  . Heart disease Mother     Heart Disease before age 58  . Hypertension Mother   . Hyperlipidemia Mother   . Heart attack Mother   . Clotting disorder Mother   . Heart disease Father     Heart Disease before age 2  . Heart attack Father   . Hyperlipidemia Father   . Hypertension Father   . Heart disease Brother     Heart Disease before age 78  . Hyperlipidemia Brother   . Hypertension Brother   . Clotting disorder Brother   . Cerebral aneurysm Sister   . Hypertension Sister   . Asthma Sister   . Cerebral aneurysm Brother   . Cancer Brother     Lung  . Hypertension Brother   . Heart attack Brother   . Heart disease Brother     Aneurysm of Brain  . Hypertension Brother   . Heart disease Brother   . Heart disease Brother   . Stroke Son     Aneurysm of Stomach   History  Substance Use Topics  . Smoking status: Former Smoker -- 1.00 packs/day for 30 years    Types: Cigarettes    Quit date: 08/13/2000  . Smokeless tobacco: Never Used  . Alcohol Use: No   OB History    No data available     Review of Systems  Constitutional:       Per HPI, otherwise negative  HENT:       Per HPI, otherwise negative  Respiratory:       Per HPI, otherwise negative  Cardiovascular:       Per HPI, otherwise negative  Gastrointestinal: Positive for vomiting.  Endocrine:       Negative aside from HPI  Genitourinary:       Neg aside from HPI   Musculoskeletal:       Per HPI, otherwise negative  Skin: Positive for pallor.  Neurological: Positive for weakness  and headaches. Negative for syncope.      Allergies  Hydromorphone; Codeine; Oxycodone-acetaminophen; Risedronate; and Avelox  Home Medications   Prior to Admission medications   Medication Sig Start Date End Date Taking? Authorizing Provider  acidophilus (RISAQUAD) CAPS capsule Take 2 capsules by mouth daily. 04/06/13   Donne Hazel, MD  albuterol (PROVENTIL) (2.5 MG/3ML) 0.083% nebulizer solution Take 2.5 mg by nebulization every 4 (four) hours as needed for wheezing or shortness of breath (((PLAN C))).     Historical Provider, MD  albuterol (VENTOLIN HFA) 108 (90 BASE) MCG/ACT inhaler Inhale 2 puffs into the lungs every 4 (four) hours as needed for wheezing or shortness of breath (((PLAN B))). 08/09/13   Pollie Friar, MD  ALPRAZolam Duanne Moron) 0.25 MG tablet Take 0.25 mg by mouth 2 (two) times daily as needed for anxiety.  03/05/14   Historical Provider, MD  amLODipine-olmesartan (AZOR) 10-40 MG per tablet Take 1 tablet by mouth daily.    Historical Provider, MD  aspirin EC 81 MG tablet Take 81 mg by mouth daily.     Historical Provider, MD  AZOR 5-40 MG per tablet Take 1 tablet by mouth every morning. 10/08/14   Historical Provider, MD  budesonide (PULMICORT) 0.25 MG/2ML nebulizer solution One twice daily with perforomist Patient not taking: Reported on 01/05/2015 11/17/12   Tanda Rockers, MD  cetirizine (ZYRTEC) 1 MG/ML syrup Take 5 mg by mouth daily as needed (For allergies.).     Historical Provider, MD  cloNIDine (CATAPRES) 0.1 MG tablet Take 0.05 mg by mouth 2 (two) times daily. Take 1 1/2 twice daily    Historical Provider, MD  clopidogrel (PLAVIX) 75 MG tablet Take 1 tablet (75 mg total) by mouth daily. 04/02/12   Conrad [place], MD  dexlansoprazole (DEXILANT) 60 MG capsule Take 1 capsule (60 mg total) by mouth daily before breakfast. 11/17/12   Tanda Rockers, MD  gabapentin (NEURONTIN) 100 MG capsule Take 1 capsule (100 mg total) by mouth 3 (three) times daily. Patient not taking:  Reported on 08/13/2014 08/04/14   Velta Addison Mikhail, DO  meclizine (ANTIVERT) 25 MG tablet Take 25 mg by mouth 2 (two) times daily as needed for dizziness.  06/28/14   Historical Provider, MD  nitroGLYCERIN (NITRODUR - DOSED IN MG/24 HR) 0.4 mg/hr patch Place 0.4 mg onto the skin daily.  02/01/14   Historical Provider, MD  nitroGLYCERIN (NITROSTAT) 0.4 MG SL tablet Place 0.4 mg under the tongue continuous as needed for chest pain.     Historical Provider, MD  prednisoLONE acetate (PRED FORTE) 1 % ophthalmic  suspension Place 2 drops into the right eye at bedtime. Administer 2 drops to the right eye 2 (two) times a day. 02/26/14   Historical Provider, MD  rosuvastatin (CRESTOR) 10 MG tablet Take 10 mg by mouth at bedtime.     Historical Provider, MD   BP 156/64 mmHg  Pulse 64  Temp(Src) 97.8 F (36.6 C) (Oral)  Resp 18  SpO2 100% Physical Exam  Constitutional: She is oriented to person, place, and time. She appears well-developed and well-nourished. No distress.  HENT:  Head: Normocephalic and atraumatic.  Eyes: Conjunctivae and EOM are normal.  Cardiovascular: Normal rate and regular rhythm.   Pulmonary/Chest: Effort normal and breath sounds normal. No stridor. No respiratory distress.  Abdominal: She exhibits no distension.  Musculoskeletal: She exhibits no edema.  Neurological: She is alert and oriented to person, place, and time. No cranial nerve deficit. She exhibits normal muscle tone. Coordination normal.  Skin: Skin is warm and dry.  Psychiatric: She has a normal mood and affect.  Nursing note and vitals reviewed.   ED Course  Procedures (including critical care time) Labs Review Labs Reviewed  CBC - Abnormal; Notable for the following:    HCT 35.9 (*)    All other components within normal limits  BASIC METABOLIC PANEL - Abnormal; Notable for the following:    Glucose, Bld 123 (*)    All other components within normal limits  I-STAT CHEM 8, ED - Abnormal; Notable for the  following:    Glucose, Bld 119 (*)    All other components within normal limits    Imaging Review No results found.   EKG Interpretation   Date/Time:  Wednesday February 09 2015 12:29:28 EDT Ventricular Rate:  77 PR Interval:  144 QRS Duration: 86 QT Interval:  368 QTC Calculation: 416 R Axis:   55 Text Interpretation:  Normal sinus rhythm Cannot rule out Anterior infarct  , age undetermined Abnormal ECG Sinus rhythm T wave abnormality Artifact  Abnormal ekg Confirmed by Carmin Muskrat  MD [phone]) on 02/09/2015 2:56:58  PM     Review of patient's chart demonstrates recent angiography of the brain, with 2 mm aneurysm, as well as multiple other abnormalities.  Catheterization report from December, 2015 IMPRESSION OF HEART CATHETERIZATION:   1. Normal left main coronary artery. 2. Moderate to severe disease of native left anterior descending artery and its branches. 3. Normal left circumflex artery and its branches. 4. Mild diffuse disease of dominant right coronary artery. 5.  Total occlusion of SVG to diagonal vessel near origin. 6.  Total occlusion of SVG to RCA near origin and proximally. 7.  Patent LIMA to LAD. 8.  Normal left ventricular systolic function.  LVEDP 18 mmHg.  Ejection fraction 75 %  MDM   Final diagnoses:  Bad headache   This patient presents with ongoing headache, worse over the past day. Patient is awake, alert, neurologically intact, and her physical exam not consistent with ruptured aneurysm, but with some concern for either expansion, or mass effect, patient will did have imaging studies. After discussion with our radiology staff, MRI, MRA was ordered. These studies were pending on sign out, but chart review demonstrates that the findings were largely reassuring, and the patient followed up as instructed with her neurologist the following day.   Carmin Muskrat, MD 02/11/15 [phone]

## 2015-02-10 ENCOUNTER — Telehealth: Payer: Self-pay

## 2015-02-10 ENCOUNTER — Encounter: Payer: Self-pay | Admitting: Adult Health

## 2015-02-10 ENCOUNTER — Telehealth (HOSPITAL_COMMUNITY): Payer: Self-pay | Admitting: Interventional Radiology

## 2015-02-10 ENCOUNTER — Ambulatory Visit (INDEPENDENT_AMBULATORY_CARE_PROVIDER_SITE_OTHER): Payer: Medicare HMO | Admitting: Adult Health

## 2015-02-10 VITALS — BP 142/62 | HR 68 | Ht 63.0 in | Wt 176.0 lb

## 2015-02-10 DIAGNOSIS — I671 Cerebral aneurysm, nonruptured: Secondary | ICD-10-CM

## 2015-02-10 DIAGNOSIS — G43809 Other migraine, not intractable, without status migrainosus: Secondary | ICD-10-CM

## 2015-02-10 MED ORDER — TOPIRAMATE 25 MG PO TABS: 25.0000 mg | ORAL_TABLET | Freq: Every day | ORAL | Status: DC

## 2015-02-10 NOTE — Progress Notes (Signed)
I have read the note, and I agree with the clinical assessment and plan.  Traniyah Hallett KEITH   

## 2015-02-10 NOTE — Patient Instructions (Signed)
Begin Topamax 25 mg at bedtime for headache If your symptoms do not improve let us know  Topiramate tablets What is this medicine? TOPIRAMATE (toe PYRE a mate) is used to treat seizures in adults or children with epilepsy. It is also used for the prevention of migraine headaches. This medicine may be used for other purposes; ask your health care provider or pharmacist if you have questions. COMMON BRAND NAME(S): Topamax, Topiragen What should I tell my health care provider before I take this medicine? They need to know if you have any of these conditions: -bleeding disorders -cirrhosis of the liver or liver disease -diarrhea -glaucoma -kidney stones or kidney disease -low blood counts, like low white cell, platelet, or red cell counts -lung disease like asthma, obstructive pulmonary disease, emphysema -metabolic acidosis -on a ketogenic diet -schedule for surgery or a procedure -suicidal thoughts, plans, or attempt; a previous suicide attempt by you or a family member -an unusual or allergic reaction to topiramate, other medicines, foods, dyes, or preservatives -pregnant or trying to get pregnant -breast-feeding How should I use this medicine? Take this medicine by mouth with a glass of water. Follow the directions on the prescription label. Do not crush or chew. You may take this medicine with meals. Take your medicine at regular intervals. Do not take it more often than directed. Talk to your pediatrician regarding the use of this medicine in children. Special care may be needed. While this drug may be prescribed for children as young as 13 years of age for selected conditions, precautions do apply. Overdosage: If you think you have taken too much of this medicine contact a poison control center or emergency room at once. NOTE: This medicine is only for you. Do not share this medicine with others. What if I miss a dose? If you miss a dose, take it as soon as you can. If your next dose  is to be taken in less than 6 hours, then do not take the missed dose. Take the next dose at your regular time. Do not take double or extra doses. What may interact with this medicine? Do not take this medicine with any of the following medications: -probenecid This medicine may also interact with the following medications: -acetazolamide -alcohol -amitriptyline -aspirin and aspirin-like medicines -birth control pills -certain medicines for depression -certain medicines for seizures -certain medicines that treat or prevent blood clots like warfarin, enoxaparin, dalteparin, apixaban, dabigatran, and rivaroxaban -digoxin -hydrochlorothiazide -lithium -medicines for pain, sleep, or muscle relaxation -metformin -methazolamide -NSAIDS, medicines for pain and inflammation, like ibuprofen or naproxen -pioglitazone -risperidone This list may not describe all possible interactions. Give your health care provider a list of all the medicines, herbs, non-prescription drugs, or dietary supplements you use. Also tell them if you smoke, drink alcohol, or use illegal drugs. Some items may interact with your medicine. What should I watch for while using this medicine? Visit your doctor or health care professional for regular checks on your progress. Do not stop taking this medicine suddenly. This increases the risk of seizures if you are using this medicine to control epilepsy. Wear a medical identification bracelet or chain to say you have epilepsy or seizures, and carry a card that lists all your medicines. This medicine can decrease sweating and increase your body temperature. Watch for signs of deceased sweating or fever, especially in children. Avoid extreme heat, hot baths, and saunas. Be careful about exercising, especially in hot weather. Contact your health care provider right away if  you notice a fever or decrease in sweating. You should drink plenty of fluids while taking this medicine. If you  have had kidney stones in the past, this will help to reduce your chances of forming kidney stones. If you have stomach pain, with nausea or vomiting and yellowing of your eyes or skin, call your doctor immediately. You may get drowsy, dizzy, or have blurred vision. Do not drive, use machinery, or do anything that needs mental alertness until you know how this medicine affects you. To reduce dizziness, do not sit or stand up quickly, especially if you are an older patient. Alcohol can increase drowsiness and dizziness. Avoid alcoholic drinks. If you notice blurred vision, eye pain, or other eye problems, seek medical attention at once for an eye exam. The use of this medicine may increase the chance of suicidal thoughts or actions. Pay special attention to how you are responding while on this medicine. Any worsening of mood, or thoughts of suicide or dying should be reported to your health care professional right away. This medicine may increase the chance of developing metabolic acidosis. If left untreated, this can cause kidney stones, bone disease, or slowed growth in children. Symptoms include breathing fast, fatigue, loss of appetite, irregular heartbeat, or loss of consciousness. Call your doctor immediately if you experience any of these side effects. Also, tell your doctor about any surgery you plan on having while taking this medicine since this may increase your risk for metabolic acidosis. Birth control pills may not work properly while you are taking this medicine. Talk to your doctor about using an extra method of birth control. Women who become pregnant while using this medicine may enroll in the [place] Pregnancy Registry by calling [phone]. This registry collects information about the safety of antiepileptic drug use during pregnancy. What side effects may I notice from receiving this medicine? Side effects that you should report to your doctor or health care  professional as soon as possible: -allergic reactions like skin rash, itching or hives, swelling of the face, lips, or tongue -decreased sweating and/or rise in body temperature -depression -difficulty breathing, fast or irregular breathing patterns -difficulty speaking -difficulty walking or controlling muscle movements -hearing impairment -redness, blistering, peeling or loosening of the skin, including inside the mouth -tingling, pain or numbness in the hands or feet -unusual bleeding or bruising -unusually weak or tired -worsening of mood, thoughts or actions of suicide or dying Side effects that usually do not require medical attention (report to your doctor or health care professional if they continue or are bothersome): -altered taste -back pain, joint or muscle aches and pains -diarrhea, or constipation -headache -loss of appetite -nausea -stomach upset, indigestion -tremors This list may not describe all possible side effects. Call your doctor for medical advice about side effects. You may report side effects to FDA at 1-800-FDA-1088. Where should I keep my medicine? Keep out of the reach of children. Store at room temperature between 15 and 30 degrees C (59 and 86 degrees F) in a tightly closed container. Protect from moisture. Throw away any unused medicine after the expiration date. NOTE: This sheet is a summary. It may not cover all possible information. If you have questions about this medicine, talk to your doctor, pharmacist, or health care provider.  2015, [employer]. (2013-08-03 23:17:57)

## 2015-02-10 NOTE — Telephone Encounter (Signed)
Pt called this morning to say that she went to the ED last night for a severe headache and had an MRI/MRA done and wanted Deveshwar to know that. I spoke with Deveshwar, he reviewed the study, and I notified the patient that per Deveshwar there was no indication based on her aneurysm history that she would be having headaches as a result of that. He advised her to keep her appointment with Dr. Jannifer Franklin today for her headache issues. Patient stated understanding and is in full agreement with this plan of care. JM

## 2015-02-10 NOTE — Telephone Encounter (Signed)
Called patient and relayed Jinny Blossom wants her to have labs done done sedimentation rate . Relayed office hours . Patient stated she will come one day next week.

## 2015-02-10 NOTE — Progress Notes (Signed)
PATIENT: MINEOLA DUAN DOB: 1940-10-19  REASON FOR VISIT: follow up- headache  HISTORY FROM: patient  HISTORY OF PRESENT ILLNESS: 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 Shee returns today complaining of headaches that has been intermittent for the last month. She states that her headache is normally located in the left temporal and occipital region. She states that she will have photophobia with the headaches as well as nausea. She also reports numbness over her scalp. She describes it as feeling like her head is a sleep. She states that the headache will slowly subside with Tylenol but the numbness remains. She also states that she will get blurry vision or and these episodes. She went to the emergency room yesterday for the symptoms. They completed an MRI and MRA. There was no acute findings. 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 She returns today for an evaluation.  HISTORY 05/28/2013: Ms. 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 The patient indicates that 3 or 4 months ago, she began having sharp jabbing headaches coming up from the back of the head, on the right greater than left side. The episodes last about 5 minutes, and then go away. The patient indicates that she has had some episodes of near-syncope within the last month or so, generally associated with the headaches. The patient will get a weak sensation in the legs, and a sensation that she may fall. The patient has had some feeling that she might black out. The patient indicates that often times, the events of headache or near syncope may occur shortly after standing up. The patient has not actually blacked out. The patient denies any neck stiffness, and she denies any pain going down the arms or legs. The patient has had no change in  balance, and she has had no problems controlling the bowels or the bladder. The patient may get a sensation of numbness of the face before the episodes of headache and near-syncope. The patient believes there may be some alteration in vision with these episodes. The patient has undergone a CAT scan of the brain that she was told was unremarkable. MRI evaluation has not been done. The patient has a history of cerebrovascular disease, with a prior left carotid endarterectomy. Recent carotid Doppler studies have been unremarkable. The patient indicates that she does not have palpitations of the heart, and she has been seen by her cardiologist, and was told that there were no cardiac issues. A recent 2-D echocardiogram was done, and she claims this study was unremarkable  REVIEW OF SYSTEMS: Out of a complete 14 system review of symptoms, the patient complains only of the following symptoms, and all other reviewed systems are negative.  Chills, fatigue, excessive sweating, ear pain, trouble swallowing, light sensitivity, shortness of breath, excessive thirst, nausea, insomnia, back pain, walking difficulty, neck pain, moles, frequency of urination, dizziness, headache, numbness, weakness   ALLERGIES: Allergies  Allergen Reactions  . Hydromorphone Palpitations and Other (See Comments)    DILAUDID  -  Pt had a Heart Attack after taking Dilaudid.  . Codeine Other (See Comments)    Dr. Terrence Dupont advised patient not to take this medication  . Oxycodone-Acetaminophen Other (See Comments)    Says it makes her feel weird  . Risedronate Other (See Comments)    Chest pain  . Avelox [Moxifloxacin Hcl In Nacl] Palpitations  HOME MEDICATIONS: Outpatient Prescriptions Prior to Visit  Medication Sig Dispense Refill  . acetaminophen (TYLENOL) 325 MG tablet Take 2 tablets (650 mg total) by mouth every 6 (six) hours as needed for moderate pain. 30 tablet 0  . acidophilus (RISAQUAD) CAPS capsule Take 2 capsules by  mouth daily. 40 capsule 0  . albuterol (PROVENTIL) (2.5 MG/3ML) 0.083% nebulizer solution Take 2.5 mg by nebulization every 4 (four) hours as needed for wheezing or shortness of breath (((PLAN C))).     . albuterol (VENTOLIN HFA) 108 (90 BASE) MCG/ACT inhaler Inhale 2 puffs into the lungs every 4 (four) hours as needed for wheezing or shortness of breath (((PLAN B))). 1 Inhaler 11  . ALPRAZolam (XANAX) 0.25 MG tablet Take 0.25 mg by mouth 2 (two) times daily as needed for anxiety.     Marland Kitchen amLODipine-olmesartan (AZOR) 10-40 MG per tablet Take 1 tablet by mouth daily.    Marland Kitchen aspirin EC 81 MG tablet Take 81 mg by mouth daily.     . AZOR 5-40 MG per tablet Take 1 tablet by mouth every morning.  3  . budesonide (PULMICORT) 0.25 MG/2ML nebulizer solution One twice daily with perforomist 120 mL 12  . cetirizine (ZYRTEC) 1 MG/ML syrup Take 5 mg by mouth daily as needed (For allergies.).     Marland Kitchen cloNIDine (CATAPRES) 0.1 MG tablet Take 0.05 mg by mouth 2 (two) times daily. Take 1 1/2 twice daily    . clopidogrel (PLAVIX) 75 MG tablet Take 1 tablet (75 mg total) by mouth daily. 90 tablet 3  . dexlansoprazole (DEXILANT) 60 MG capsule Take 1 capsule (60 mg total) by mouth daily before breakfast. 30 capsule 11  . meclizine (ANTIVERT) 25 MG tablet Take 25 mg by mouth 2 (two) times daily as needed for dizziness.   1  . nitroGLYCERIN (NITRODUR - DOSED IN MG/24 HR) 0.4 mg/hr patch Place 0.4 mg onto the skin daily.     . nitroGLYCERIN (NITROSTAT) 0.4 MG SL tablet Place 0.4 mg under the tongue continuous as needed for chest pain.     . prednisoLONE acetate (PRED FORTE) 1 % ophthalmic suspension Place 2 drops into the right eye at bedtime. Administer 2 drops to the right eye 2 (two) times a day.    . rosuvastatin (CRESTOR) 10 MG tablet Take 10 mg by mouth at bedtime.     . gabapentin (NEURONTIN) 100 MG capsule Take 1 capsule (100 mg total) by mouth 3 (three) times daily. (Patient not taking: Reported on 08/13/2014) 90 capsule 0    No facility-administered medications prior to visit.    PAST MEDICAL HISTORY: Past Medical History  Diagnosis Date  . Asthma   . Reflux   . Hiatal hernia   . Peripheral vascular disease   . Hypertension   . Hyperlipidemia   . Iliac artery aneurysm   . Aortoiliac occlusive disease   . 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 Carotid artery occlusion   . Myocardial infarction 01/01/2000    Cardiac catheterization  . COPD (chronic obstructive pulmonary disease)   . Coronary artery disease   . Arnold-Chiari malformation 1998  . 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  . Gastroesophageal reflux disease     PAST SURGICAL HISTORY: Past Surgical History  Procedure Laterality Date  . Eye surgery      Laser surgery for retinal hemorrhage  . Rotator cuff repair      Right  . Coronary artery bypass graft  01/01/2000  . Post  coronary artery  bpg  01/05/2000    Right jugular sheath removed  . Carotid endarterectomy  03/29/2010    Left  CEA  . Pr vein bypass graft,aorto-fem-pop    . Arnold-chiari malformation repair  1998    Suboccipital craniectomy  . Corneal transplant      Right  . Appendectomy    . Abdominal hysterectomy    . Left heart catheterization with coronary angiogram N/A 08/03/2014    Procedure: LEFT HEART CATHETERIZATION WITH CORONARY ANGIOGRAM;  Surgeon: Birdie Riddle, MD;  Location: [facility]ATH [facility];  Service: Cardiovascular;  Laterality: N/A;  . Cholecystectomy      Gall Bladder    FAMILY HISTORY: Family History  Problem Relation Age of Onset  . Heart disease Mother     Heart Disease before age 26  . Hypertension Mother   . Hyperlipidemia Mother   . Heart attack Mother   . Clotting disorder Mother   . Heart disease Father     Heart Disease before age 59  . Heart attack Father   . Hyperlipidemia Father   . Hypertension Father   . Heart disease Brother     Heart Disease before age 52  . Hyperlipidemia Brother   . Hypertension Brother   . Clotting  disorder Brother   . Cerebral aneurysm Sister   . Hypertension Sister   . Asthma Sister   . Cerebral aneurysm Brother   . Cancer Brother     Lung  . Hypertension Brother   . Heart attack Brother   . Heart disease Brother     Aneurysm of Brain  . Hypertension Brother   . Heart disease Brother   . Heart disease Brother   . Stroke Son     Aneurysm of Stomach    SOCIAL HISTORY: History   Social History  . Marital Status: Widowed    Spouse Name: N/A  . Number of Children: 4  . Years of Education: 7TH   Occupational History  . Not on file.   Social History Main Topics  . Smoking status: Former Smoker -- 1.00 packs/day for 30 years    Types: Cigarettes    Quit date: 08/13/2000  . Smokeless tobacco: Never Used  . Alcohol Use: No  . Drug Use: No  . Sexual Activity: Not on file   Other Topics Concern  . Not on file   Social History Narrative      PHYSICAL EXAM  Filed Vitals:   02/10/15 1527  BP: 142/62  Pulse: 68  Height: '5\' 3"'$  (1.6 m)  Weight: 176 lb (79.833 kg)   Body mass index is 31.18 kg/(m^2).  Generalized: Well developed, in no acute distress   Neurological examination  Mentation: Alert oriented to time, place, history taking. Follows all commands speech and language fluent Cranial nerve II-XII: Pupils were equal round reactive to light. Extraocular movements were full, visual field were full on confrontational test. Facial sensation and strength were normal. Uvula tongue midline. Head turning and shoulder shrug  were normal and symmetric. Motor: The motor testing reveals 5 over 5 strength of all 4 extremities. Good symmetric motor tone is noted throughout.  Sensory: Sensory testing is intact to soft touch on all 4 extremities. No evidence of extinction is noted.  Coordination: Cerebellar testing reveals good finger-nose-finger and heel-to-shin bilaterally.  Gait and station: gait is slightly unsteady. Tandem gait is normal. Romberg is  negative. Reflexes: Deep tendon reflexes are symmetric and normal bilaterally.  DIAGNOSTIC DATA (LABS, IMAGING, TESTING) - I reviewed patient records, labs, notes, testing and imaging myself where available.  Lab Results  Component Value Date   WBC 7.9 02/09/2015   HGB 12.9 02/09/2015   HCT 38.0 02/09/2015   MCV 83.7 02/09/2015   PLT 276 02/09/2015      Component Value Date/Time   NA 139 02/09/2015 1305   K 4.5 02/09/2015 1305   CL 104 02/09/2015 1305   CO2 28 02/09/2015 1251   GLUCOSE 119* 02/09/2015 1305   BUN 14 02/09/2015 1305   CREATININE 1.00 02/09/2015 1305   CALCIUM 9.4 02/09/2015 1251   PROT 6.9 08/08/2013 0600   ALBUMIN 3.2* 08/08/2013 0600   AST 18 08/08/2013 0600   ALT 13 08/08/2013 0600   ALKPHOS 62 08/08/2013 0600   BILITOT 0.2* 08/08/2013 0600   GFRNONAA >60 02/09/2015 1251   GFRAA >60 02/09/2015 1251   Lab Results  Component Value Date   CHOL 153 08/04/2014   HDL 49 08/04/2014   LDLCALC 93 08/04/2014   TRIG 57 08/04/2014   CHOLHDL 3.1 08/04/2014    ASSESSMENT AND PLAN 74 y.o. year old female  has a past medical history of Asthma; Reflux; Hiatal hernia; Peripheral vascular disease; Hypertension; Hyperlipidemia; Iliac artery aneurysm; Aortoiliac occlusive disease; Aneurysm of common iliac artery (sept. 2009); Carotid artery occlusion; Myocardial infarction (01/01/2000); COPD (chronic obstructive pulmonary disease); Coronary artery disease; Arnold-Chiari malformation (1998); Bilateral occipital neuralgia (05/28/2013); and Gastroesophageal reflux disease. here with:  1. Migraine headache  Patient is now presenting with intermittent headache that started 1 month ago.  Her MRI and MRA did not show any acute findings. I consulted with Dr. Jannifer Franklin who also reviewed her MRI and MRA and confirmed  no acute findings. At this time I'll start the patient on Topamax 25 mg at bedtime. I have reviewed the side effects of this medication with the patient. The patient  states that she will check with her cardiologist before starting this medication. I am amendable to this. Patient advised that if her symptoms worsen or she develops new symptoms she should let us know. Otherwise she will follow-up in 3-4 months or sooner if needed.  Addendum: I will check a sedimentation rate. Patient will be called and advised to come have lab work at her convenience.      Ward Givens, MSN, NP-C 02/10/2015, 3:42 PM [facility] [address], [place], [place] [address] [phone]  Note: This document was prepared with digital dictation and possible smart phrase technology. Any transcriptional errors that result from this process are unintentional.

## 2015-02-11 ENCOUNTER — Other Ambulatory Visit (INDEPENDENT_AMBULATORY_CARE_PROVIDER_SITE_OTHER): Payer: Self-pay

## 2015-02-11 DIAGNOSIS — Z0289 Encounter for other administrative examinations: Secondary | ICD-10-CM

## 2015-02-11 DIAGNOSIS — G43809 Other migraine, not intractable, without status migrainosus: Secondary | ICD-10-CM

## 2015-02-12 LAB — SEDIMENTATION RATE: Sed Rate: 27 mm/hr (ref 0–40)

## 2015-02-15 ENCOUNTER — Telehealth: Payer: Self-pay

## 2015-02-15 NOTE — Telephone Encounter (Signed)
Called and left message and relayed labs were normal.

## 2015-04-20 ENCOUNTER — Encounter (HOSPITAL_COMMUNITY): Payer: Self-pay | Admitting: *Deleted

## 2015-04-20 ENCOUNTER — Other Ambulatory Visit: Payer: Self-pay | Admitting: Gastroenterology

## 2015-04-22 ENCOUNTER — Ambulatory Visit (HOSPITAL_COMMUNITY)
Admission: RE | Admit: 2015-04-22 | Discharge: 2015-04-22 | Disposition: A | Discharge status: Home / Self Care | Payer: Medicare HMO | Source: Ambulatory Visit | Attending: Gastroenterology | Admitting: Gastroenterology

## 2015-04-22 ENCOUNTER — Encounter (HOSPITAL_COMMUNITY): Payer: Self-pay | Admitting: *Deleted

## 2015-04-22 ENCOUNTER — Ambulatory Visit (HOSPITAL_COMMUNITY): Payer: Medicare HMO | Admitting: Anesthesiology

## 2015-04-22 ENCOUNTER — Encounter (HOSPITAL_COMMUNITY)
Admission: RE | Disposition: A | Discharge status: Home / Self Care | Payer: Self-pay | Source: Ambulatory Visit | Attending: Gastroenterology

## 2015-04-22 DIAGNOSIS — K219 Gastro-esophageal reflux disease without esophagitis: Secondary | ICD-10-CM | POA: Insufficient documentation

## 2015-04-22 DIAGNOSIS — I739 Peripheral vascular disease, unspecified: Secondary | ICD-10-CM | POA: Diagnosis not present

## 2015-04-22 DIAGNOSIS — I10 Essential (primary) hypertension: Secondary | ICD-10-CM | POA: Diagnosis not present

## 2015-04-22 DIAGNOSIS — M5481 Occipital neuralgia: Secondary | ICD-10-CM | POA: Insufficient documentation

## 2015-04-22 DIAGNOSIS — J449 Chronic obstructive pulmonary disease, unspecified: Secondary | ICD-10-CM | POA: Insufficient documentation

## 2015-04-22 DIAGNOSIS — Z87891 Personal history of nicotine dependence: Secondary | ICD-10-CM | POA: Insufficient documentation

## 2015-04-22 DIAGNOSIS — K5730 Diverticulosis of large intestine without perforation or abscess without bleeding: Secondary | ICD-10-CM | POA: Insufficient documentation

## 2015-04-22 DIAGNOSIS — Z791 Long term (current) use of non-steroidal anti-inflammatories (NSAID): Secondary | ICD-10-CM | POA: Insufficient documentation

## 2015-04-22 DIAGNOSIS — I723 Aneurysm of iliac artery: Secondary | ICD-10-CM | POA: Diagnosis not present

## 2015-04-22 DIAGNOSIS — D122 Benign neoplasm of ascending colon: Secondary | ICD-10-CM | POA: Insufficient documentation

## 2015-04-22 DIAGNOSIS — K449 Diaphragmatic hernia without obstruction or gangrene: Secondary | ICD-10-CM | POA: Diagnosis not present

## 2015-04-22 DIAGNOSIS — Z1211 Encounter for screening for malignant neoplasm of colon: Secondary | ICD-10-CM | POA: Insufficient documentation

## 2015-04-22 DIAGNOSIS — Z86010 Personal history of colonic polyps: Secondary | ICD-10-CM | POA: Insufficient documentation

## 2015-04-22 DIAGNOSIS — I252 Old myocardial infarction: Secondary | ICD-10-CM | POA: Insufficient documentation

## 2015-04-22 DIAGNOSIS — I2510 Atherosclerotic heart disease of native coronary artery without angina pectoris: Secondary | ICD-10-CM | POA: Diagnosis not present

## 2015-04-22 DIAGNOSIS — J45909 Unspecified asthma, uncomplicated: Secondary | ICD-10-CM | POA: Insufficient documentation

## 2015-04-22 DIAGNOSIS — E785 Hyperlipidemia, unspecified: Secondary | ICD-10-CM | POA: Diagnosis not present

## 2015-04-22 HISTORY — DX: Other specified postprocedural states: R11.2

## 2015-04-22 HISTORY — DX: Other complications of anesthesia, initial encounter: T88.59XA

## 2015-04-22 HISTORY — PX: COLONOSCOPY WITH PROPOFOL: SHX5780

## 2015-04-22 HISTORY — DX: Other specified postprocedural states: Z98.890

## 2015-04-22 HISTORY — DX: Adverse effect of unspecified anesthetic, initial encounter: T41.45XA

## 2015-04-22 SURGERY — COLONOSCOPY WITH PROPOFOL
Anesthesia: Monitor Anesthesia Care

## 2015-04-22 MED ORDER — SODIUM CHLORIDE 0.9 % IV SOLN: INTRAVENOUS | Status: DC

## 2015-04-22 MED ORDER — LACTATED RINGERS IV SOLN
INTRAVENOUS | Status: DC
  Administered 2015-04-22: 1000 mL via INTRAVENOUS

## 2015-04-22 MED ORDER — PROPOFOL 10 MG/ML IV BOLUS
INTRAVENOUS | Status: AC
  Filled 2015-04-22: qty 20

## 2015-04-22 MED ORDER — ONDANSETRON HCL 4 MG/2ML IJ SOLN
INTRAMUSCULAR | Status: DC | PRN
  Administered 2015-04-22: 4 mg via INTRAVENOUS

## 2015-04-22 MED ORDER — PROPOFOL INFUSION 10 MG/ML OPTIME
INTRAVENOUS | Status: DC | PRN
  Administered 2015-04-22: 80 ug/kg/min via INTRAVENOUS

## 2015-04-22 MED ORDER — ONDANSETRON HCL 4 MG/2ML IJ SOLN
INTRAMUSCULAR | Status: AC
  Filled 2015-04-22: qty 2

## 2015-04-22 MED ORDER — PROPOFOL 10 MG/ML IV BOLUS
INTRAVENOUS | Status: DC | PRN
  Administered 2015-04-22: 50 mg via INTRAVENOUS
  Administered 2015-04-22: 20 mg via INTRAVENOUS

## 2015-04-22 SURGICAL SUPPLY — 22 items

## 2015-04-22 NOTE — Anesthesia Postprocedure Evaluation (Signed)
  Anesthesia Post-op Note  Patient: [name]  Procedure(s) Performed: Procedure(s) (LRB): COLONOSCOPY WITH PROPOFOL (N/A)  Patient Location: PACU  Anesthesia Type: MAC  Level of Consciousness: awake and alert   Airway and Oxygen Therapy: Patient Spontanous Breathing  Post-op Pain: mild  Post-op Assessment: Post-op Vital signs reviewed, Patient's Cardiovascular Status Stable, Respiratory Function Stable, Patent Airway and No signs of Nausea or vomiting  Last Vitals:  Filed Vitals:   04/22/15 0950  BP: 165/50  Pulse:   Temp:   Resp:     Post-op Vital Signs: stable   Complications: No apparent anesthesia complications

## 2015-04-22 NOTE — Op Note (Signed)
[facility] [place], [address]   COLONOSCOPY PROCEDURE REPORT  PATIENT: Natalie, Mccullough  MR#: [id number] BIRTHDATE: 11-04-1940 , 74  yrs. old GENDER: female ENDOSCOPIST: Carol Ada, MD REFERRED BY: PROCEDURE DATE:  April 30, 2015 PROCEDURE:   Colonoscopy with snare polypectomy ASA CLASS:   Class III INDICATIONS: Personal history of polyps MEDICATIONS: Monitored anesthesia care  DESCRIPTION OF PROCEDURE:   After the risks and benefits and of the procedure were explained, informed consent was obtained.  revealed no abnormalities of the rectum.    The [id number] ([id number]) endoscope was introduced through the anus and advanced to the cecum, which was identified by both the appendix and ileocecal valve .  The quality of the prep was excellent. .  The instrument was then slowly withdrawn as the colon was fully examined. Estimated blood loss is zero unless otherwise noted in this procedure report.   FINDINGS: A total of four polyps were identified at the hepatic flexure.  Three of the polyps were 2 mm in size and the largest was approximately 5-7 mm., image 008.  All of the polyps were removed with a cold snare.  Another 2 mm sessile splenic flexure polyp was removed with a cold snare, but it was not retrieved.  There was some evidence of very small diverticula in the sigmoid colon. The scope was then withdrawn from the patient and the procedure completed.  WITHDRAWAL TIME: 23.5 minutes  COMPLICATIONS: There were no immediate complications. ENDOSCOPIC IMPRESSION: 1) Colonic polyps. 2) ? Very small sigmoid diverticula. RECOMMENDATIONS: 1) Follow up biopsies. 2) Resume ASA and Plavix.  REPEAT EXAM:  cc: _______________________________ eSignedCarol Ada, MD Apr 30, 2015 9:22 AM   CPT CODES: ICD CODES:  The ICD and CPT codes recommended by this software are interpretations from the data that the clinical staff has captured with the  software.  The verification of the translation of this report to the ICD and CPT codes and modifiers is the sole responsibility of the health care institution and practicing physician where this report was generated.  [employer] will not be held responsible for the validity of the ICD and CPT codes included on this report.  AMA assumes no liability for data contained or not contained herein. CPT is a Designer, television/film set of the [employer].   PATIENT NAME:  Natalie, Mccullough MR#: [id number]

## 2015-04-22 NOTE — Discharge Instructions (Signed)
Colonoscopy, Care After °These instructions give you information on caring for yourself after your procedure. Your doctor may also give you more specific instructions. Call your doctor if you have any problems or questions after your procedure. °HOME CARE °· Do not drive for 24 hours. °· Do not sign important papers or use machinery for 24 hours. °· You may shower. °· You may go back to your usual activities, but go slower for the first 24 hours. °· Take rest breaks often during the first 24 hours. °· Walk around or use warm packs on your belly (abdomen) if you have belly cramping or gas. °· Drink enough fluids to keep your pee (urine) clear or pale yellow. °· Resume your normal diet. Avoid heavy or fried foods. °· Avoid drinking alcohol for 24 hours or as told by your doctor. °· Only take medicines as told by your doctor. °If a tissue sample (biopsy) was taken during the procedure:  °· Do not take aspirin or blood thinners for 7 days, or as told by your doctor. °· Do not drink alcohol for 7 days, or as told by your doctor. °· Eat soft foods for the first 24 hours. °GET HELP IF: °You still have a small amount of blood in your poop (stool) 2-3 days after the procedure. °GET HELP RIGHT AWAY IF: °· You have more than a small amount of blood in your poop. °· You see clumps of tissue (blood clots) in your poop. °· Your belly is puffy (swollen). °· You feel sick to your stomach (nauseous) or throw up (vomit). °· You have a fever. °· You have belly pain that gets worse and medicine does not help. °MAKE SURE YOU: °· Understand these instructions. °· Will watch your condition. °· Will get help right away if you are not doing well or get worse. °Document Released: 09/01/2010 Document Revised: 08/04/2013 Document Reviewed: 04/06/2013 °ExitCare® Patient Information ©2015 [employer]. This information is not intended to replace advice given to you by your health care provider. Make sure you discuss any questions you have with  your health care provider. ° °

## 2015-04-22 NOTE — Transfer of Care (Signed)
Immediate Anesthesia Transfer of Care Note  Patient: [name]  Procedure(s) Performed: Procedure(s): COLONOSCOPY WITH PROPOFOL (N/A)  Patient Location: PACU  Anesthesia Type:MAC  Level of Consciousness:  sedated, patient cooperative and responds to stimulation  Airway & Oxygen Therapy:Patient Spontanous Breathing and Patient connected to face mask oxgen  Post-op Assessment:  Report given to PACU RN and Post -op Vital signs reviewed and stable  Post vital signs:  Reviewed and stable  Last Vitals:  Filed Vitals:   04/22/15 0822  BP: 169/51  Pulse: 59  Temp: 36.8 C  Resp: 18    Complications: No apparent anesthesia complications

## 2015-04-22 NOTE — Anesthesia Preprocedure Evaluation (Signed)
Anesthesia Evaluation  Patient identified by MRN, date of birth, ID band Patient awake    Reviewed: Allergy & Precautions, H&P , NPO status , Patient's Chart, lab work & pertinent test results  History of Anesthesia Complications (+) PONV  Airway Mallampati: II  TM Distance: >3 FB Neck ROM: full    Dental no notable dental hx. (+) Dental Advisory Given   Pulmonary shortness of breath and with exertion, asthma , COPD,  COPD inhaler, former smoker,    Pulmonary exam normal breath sounds clear to auscultation       Cardiovascular hypertension, Pt. on medications + CAD, + Past MI, + CABG and + Peripheral Vascular Disease  Normal cardiovascular exam Rhythm:regular Rate:Normal     Neuro/Psych Bilateral occipital neuralgia. CEA negative neurological ROS  negative psych ROS   GI/Hepatic negative GI ROS, Neg liver ROS, hiatal hernia, GERD  Medicated and Controlled,  Endo/Other  negative endocrine ROS  Renal/GU negative Renal ROS  negative genitourinary   Musculoskeletal   Abdominal   Peds  Hematology negative hematology ROS (+)   Anesthesia Other Findings   Reproductive/Obstetrics negative OB ROS                             Anesthesia Physical Anesthesia Plan  ASA: III  Anesthesia Plan: MAC   Post-op Pain Management:    Induction:   Airway Management Planned:   Additional Equipment:   Intra-op Plan:   Post-operative Plan:   Informed Consent: I have reviewed the patients History and Physical, chart, labs and discussed the procedure including the risks, benefits and alternatives for the proposed anesthesia with the patient or authorized representative who has indicated his/her understanding and acceptance.   Dental Advisory Given  Plan Discussed with: CRNA and Surgeon  Anesthesia Plan Comments:         Anesthesia Quick Evaluation

## 2015-04-22 NOTE — H&P (Signed)
[name] HPI: This is a 74 year old female with a recent of history of an MI 6-7 months ago.  It was a minor MI and she was cleared for the colonoscopy by Dr. Terrence Dupont.  She underwent a colonoscopy on 04/01/2012 with findings of three tubular adenomas in the transverse and descending colon.  No GI complaints since that time.  Past Medical History  Diagnosis Date  . Asthma   . Reflux   . Hiatal hernia   . Peripheral vascular disease   . Hypertension   . Hyperlipidemia   . Iliac artery aneurysm   . Aortoiliac occlusive disease   . 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 Carotid artery occlusion   . Myocardial infarction 01/01/2000    Cardiac catheterization  . COPD (chronic obstructive pulmonary disease)   . Coronary artery disease   . Arnold-Chiari malformation 1998  . 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  . Gastroesophageal reflux disease   . Complication of anesthesia   . PONV (postoperative nausea and vomiting)     occassionally    Past Surgical History  Procedure Laterality Date  . Eye surgery      Laser surgery for retinal hemorrhage  . Rotator cuff repair      Right  . Coronary artery bypass graft  01/01/2000  . Post coronary artery  bpg  01/05/2000    Right jugular sheath removed  . Carotid endarterectomy  03/29/2010    Left  CEA  . Pr vein bypass graft,aorto-fem-pop    . Arnold-chiari malformation repair  1998    Suboccipital craniectomy  . Corneal transplant      Right  . Appendectomy    . Abdominal hysterectomy    . Left heart catheterization with coronary angiogram N/A 08/03/2014    Procedure: LEFT HEART CATHETERIZATION WITH CORONARY ANGIOGRAM;  Surgeon: Birdie Riddle, MD;  Location: [facility];  Service: Cardiovascular;  Laterality: N/A;  . Cholecystectomy      Gall Bladder    Family History  Problem Relation Age of Onset  . Heart disease Mother     Heart Disease before age 66  . Hypertension Mother   . Hyperlipidemia Mother   . Heart  attack Mother   . Clotting disorder Mother   . Heart disease Father     Heart Disease before age 88  . Heart attack Father   . Hyperlipidemia Father   . Hypertension Father   . Heart disease Brother     Heart Disease before age 84  . Hyperlipidemia Brother   . Hypertension Brother   . Clotting disorder Brother   . Cerebral aneurysm Sister   . Hypertension Sister   . Asthma Sister   . Cerebral aneurysm Brother   . Cancer Brother     Lung  . Hypertension Brother   . Heart attack Brother   . Heart disease Brother     Aneurysm of Brain  . Hypertension Brother   . Heart disease Brother   . Heart disease Brother   . Stroke Son     Aneurysm of Stomach    Social History:  reports that she quit smoking about 14 years ago. Her smoking use included Cigarettes. She has a 30 pack-year smoking history. She has never used smokeless tobacco. She reports that she does not drink alcohol or use illicit drugs.  Allergies:  Allergies  Allergen Reactions  . Hydromorphone Palpitations and Other (See Comments)    DILAUDID  -  Pt had a Heart Attack after taking Dilaudid.  . Codeine Other (See Comments)    Dr. Terrence Dupont advised patient not to take this medication  . Oxycodone-Acetaminophen Other (See Comments)    Says it makes her feel weird  . Risedronate Other (See Comments)    Chest pain  . Avelox [Moxifloxacin Hcl In Nacl] Palpitations    Medications:  Scheduled:  Continuous: . sodium chloride    . lactated ringers 1,000 mL (04/22/15 0817)    No results found for this or any previous visit (from the past 24 hour(s)).   No results found.  ROS:  As stated above in the HPI otherwise negative.  Blood pressure 169/51, pulse 59, temperature 98.3 F (36.8 C), temperature source Oral, resp. rate 18, height '5\' 2"'$  (1.575 m), weight 78.472 kg (173 lb), SpO2 100 %.    PE: Gen: NAD, Alert and Oriented HEENT:  [place]/AT, EOMI Neck: Supple, no LAD Lungs: CTA Bilaterally CV: RRR without  M/G/R ABM: Soft, NTND, +BS Ext: No C/C/E  Assessment/Plan: 1) Personal history of colonic polyps - colonoscopy today.  [name] D 04/22/2015, 8:30 AM

## 2015-04-26 ENCOUNTER — Encounter (HOSPITAL_COMMUNITY): Payer: Self-pay | Admitting: Gastroenterology

## 2015-06-13 ENCOUNTER — Ambulatory Visit (INDEPENDENT_AMBULATORY_CARE_PROVIDER_SITE_OTHER): Payer: Medicare HMO | Admitting: Adult Health

## 2015-06-13 ENCOUNTER — Encounter: Payer: Self-pay | Admitting: Adult Health

## 2015-06-13 VITALS — BP 118/60 | HR 72 | Resp 20 | Ht 62.0 in | Wt 174.0 lb

## 2015-06-13 DIAGNOSIS — I671 Cerebral aneurysm, nonruptured: Secondary | ICD-10-CM

## 2015-06-13 DIAGNOSIS — G479 Sleep disorder, unspecified: Secondary | ICD-10-CM

## 2015-06-13 DIAGNOSIS — R51 Headache: Secondary | ICD-10-CM | POA: Diagnosis not present

## 2015-06-13 DIAGNOSIS — R519 Headache, unspecified: Secondary | ICD-10-CM

## 2015-06-13 NOTE — Progress Notes (Signed)
PATIENT: [name] DOB: 12/31/1940  REASON FOR VISIT: follow up HISTORY FROM: patient  HISTORY OF PRESENT ILLNESS:  [name] is a 74 year old female with a history of Arnold-Chiari malformation status post suboccipital craniotomy in the past , occipital neuralgia and headaches. She returns today for follow-up. At the last visit the patient was having intermittent headaches and she was started on Topamax. The patient reports that she stopped the Topamax but she did not see the benefit. She also felt that it was causing her to be more tired and fatigued. Although she does state that she did not notice any changes in her fatigue when she stopped the Topamax. The patient continues to have headaches. They're primarily located in the center of her head she describes it as a pressure sensation. She does have photophobia intermittently but denies phonophobia. She does have nausea but no vomiting. She states that her headache frequency varies. She states there are times that she has 2 or 3 headaches a week a and other times that she may only have 2 or 3 headaches a month.  She also feels that her lack of good restorative sleep may contribute to her headaches. She states that she has a hard time staying asleep at night. She states that she usually wakes up and feels like she is " strangling " and we'll have to cough several times. She states other times she wakes up to use the restroom. She denies any history of snoring. She does feel fatigued throughout the day  But not sleepy. She returns today for an evaluation.  HISTORY 02/10/15: [name] is a 74 year old female with a history of Arnold-Chiari malformation status post suboccipital craniectomy in the past, occipital neuralgia. Shee returns today complaining of headaches that has been intermittent for the last month. She states that her headache is normally located in the left temporal and occipital region. She states that she will have photophobia with  the headaches as well as nausea. She also reports numbness over her scalp. She describes it as feeling like her head is a sleep. She states that the headache will slowly subside with Tylenol but the numbness remains. She also states that she will get blurry vision or and these episodes. She went to the emergency room yesterday for the symptoms. They completed an MRI and MRA. There was no acute findings. 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 She returns today for an evaluation.  HISTORY 05/28/2013: [name] is a 73 year old right-handed white female with a history of Arnold-Chiari malformation status post suboccipital craniectomy in the past. The patient indicates that 3 or 4 months ago, she began having sharp jabbing headaches coming up from the back of the head, on the right greater than left side. The episodes last about 5 minutes, and then go away. The patient indicates that she has had some episodes of near-syncope within the last month or so, generally associated with the headaches. The patient will get a weak sensation in the legs, and a sensation that she may fall. The patient has had some feeling that she might black out. The patient indicates that often times, the events of headache or near syncope may occur shortly after standing up. The patient has not actually blacked out. The patient denies any neck stiffness, and she denies any pain going down the arms or legs. The patient has had no change in balance, and she has had no problems controlling the bowels or the bladder. The patient may get a sensation  of numbness of the face before the episodes of headache and near-syncope. The patient believes there may be some alteration in vision with these episodes. The patient has undergone a CAT scan of the brain that she was told was unremarkable. MRI evaluation has not been done. The patient has a history of cerebrovascular disease, with a prior left carotid endarterectomy. Recent carotid Doppler studies have been  unremarkable. The patient indicates that she does not have palpitations of the heart, and she has been seen by her cardiologist, and was told that there were no cardiac issues. A recent 2-D echocardiogram was done, and she claims this study was unremarkable   REVIEW OF SYSTEMS: Out of a complete 14 system review of symptoms, the patient complains only of the following symptoms, and all other reviewed systems are negative.   appetite change, chills, fatigue, light sensitivity, loss of vision, blurred vision, shortness of breath, leg swelling, abdominal pain, nausea, insomnia, frequent waking, cold intolerance, excessive thirst, food allergies, incontinence of bladder, frequency of urination, urgency, back pain, neck pain, agitation, dizziness, headache  fatigue severity score 47 Epworth sleepiness score 2  ALLERGIES: Allergies  Allergen Reactions  . Hydromorphone Palpitations and Other (See Comments)    DILAUDID  -  Pt had a Heart Attack after taking Dilaudid.  . Codeine Other (See Comments)    Dr. Terrence Dupont advised patient not to take this medication  . Oxycodone-Acetaminophen Other (See Comments)    Says it makes her feel weird  . Risedronate Other (See Comments)    Chest pain  . Avelox [Moxifloxacin Hcl In Nacl] Palpitations    HOME MEDICATIONS: Outpatient Prescriptions Prior to Visit  Medication Sig Dispense Refill  . acetaminophen (TYLENOL) 325 MG tablet Take 2 tablets (650 mg total) by mouth every 6 (six) hours as needed for moderate pain. 30 tablet 0  . albuterol (PROVENTIL) (2.5 MG/3ML) 0.083% nebulizer solution Take 2.5 mg by nebulization every 4 (four) hours as needed for wheezing or shortness of breath (((PLAN C))).     . albuterol (VENTOLIN HFA) 108 (90 BASE) MCG/ACT inhaler Inhale 2 puffs into the lungs every 4 (four) hours as needed for wheezing or shortness of breath (((PLAN B))). 1 Inhaler 11  . ALPRAZolam (XANAX) 0.25 MG tablet Take 0.25 mg by mouth 2 (two) times daily as  needed for anxiety.     Marland Kitchen aspirin EC 81 MG tablet Take 81 mg by mouth daily.     . AZOR 5-40 MG per tablet Take 1 tablet by mouth every morning.  3  . cetirizine (ZYRTEC) 1 MG/ML syrup Take 5 mg by mouth daily as needed (For allergies.).     Marland Kitchen cloNIDine (CATAPRES) 0.1 MG tablet Take 0.05 mg by mouth 2 (two) times daily.     . clopidogrel (PLAVIX) 75 MG tablet Take 1 tablet (75 mg total) by mouth daily. 90 tablet 3  . dexlansoprazole (DEXILANT) 60 MG capsule Take 1 capsule (60 mg total) by mouth daily before breakfast. 30 capsule 11  . nitroGLYCERIN (NITRODUR - DOSED IN MG/24 HR) 0.4 mg/hr patch Place 0.4 mg onto the skin daily.     . nitroGLYCERIN (NITROSTAT) 0.4 MG SL tablet Place 0.4 mg under the tongue continuous as needed for chest pain.     . prednisoLONE acetate (PRED FORTE) 1 % ophthalmic suspension Place 2 drops into the right eye at bedtime.     . rosuvastatin (CRESTOR) 10 MG tablet Take 10 mg by mouth at bedtime.  No facility-administered medications prior to visit.    PAST MEDICAL HISTORY: Past Medical History  Diagnosis Date  . Asthma   . Reflux   . Hiatal hernia   . Peripheral vascular disease ([place])   . Hypertension   . Hyperlipidemia   . Iliac artery aneurysm ([place])   . 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[place])   . 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 Carotid artery occlusion   . Myocardial infarction [facility]) 01/01/2000    Cardiac catheterization  . COPD (chronic obstructive pulmonary disease) ([place])   . Coronary artery disease   . Arnold-Chiari malformation ([place]) 1998  . 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  . Gastroesophageal reflux disease   . Complication of anesthesia   . PONV (postoperative nausea and vomiting)     occassionally    PAST SURGICAL HISTORY: Past Surgical History  Procedure Laterality Date  . Eye surgery      Laser surgery for retinal hemorrhage  . Rotator cuff repair      Right  . Coronary artery bypass graft  01/01/2000  .  Post coronary artery  bpg  01/05/2000    Right jugular sheath removed  . Carotid endarterectomy  03/29/2010    Left  CEA  . Pr vein bypass graft,aorto-fem-pop    . Arnold-chiari malformation repair  1998    Suboccipital craniectomy  . Corneal transplant      Right  . Appendectomy    . Abdominal hysterectomy    . Left heart catheterization with coronary angiogram N/A 08/03/2014    Procedure: LEFT HEART CATHETERIZATION WITH CORONARY ANGIOGRAM;  Surgeon: Birdie Riddle, MD;  Location: [facility];  Service: Cardiovascular;  Laterality: N/A;  . Cholecystectomy      Gall Bladder  . Colonoscopy with propofol N/A 04/22/2015    Procedure: COLONOSCOPY WITH PROPOFOL;  Surgeon: Carol Ada, MD;  Location: [facility];  Service: Endoscopy;  Laterality: N/A;    FAMILY HISTORY: Family History  Problem Relation Age of Onset  . Heart disease Mother     Heart Disease before age 84  . Hypertension Mother   . Hyperlipidemia Mother   . Heart attack Mother   . Clotting disorder Mother   . Heart disease Father     Heart Disease before age 27  . Heart attack Father   . Hyperlipidemia Father   . Hypertension Father   . Heart disease Brother     Heart Disease before age 57  . Hyperlipidemia Brother   . Hypertension Brother   . Clotting disorder Brother   . Cerebral aneurysm Sister   . Hypertension Sister   . Asthma Sister   . Cerebral aneurysm Brother   . Cancer Brother     Lung  . Hypertension Brother   . Heart attack Brother   . Heart disease Brother     Aneurysm of Brain  . Hypertension Brother   . Heart disease Brother   . Heart disease Brother   . Stroke Son     Aneurysm of Stomach    SOCIAL HISTORY: Social History   Social History  . Marital Status: Widowed    Spouse Name: N/A  . Number of Children: 4  . Years of Education: 7TH   Occupational History  . Not on file.   Social History Main Topics  . Smoking status: Former Smoker -- 1.00 packs/day for 30 years    Types:  Cigarettes    Quit date: 08/13/2000  . Smokeless tobacco: Never Used  .  Alcohol Use: No  . Drug Use: No  . Sexual Activity: Not on file   Other Topics Concern  . Not on file   Social History Narrative    PHYSICAL EXAM  Filed Vitals:   06/13/15 0914  BP: 118/60  Pulse: 72  Resp: 20  Height: '5\' 2"'$  (1.575 m)  Weight: 174 lb (78.926 kg)   Body mass index is 31.82 kg/(m^2).  Generalized: Well developed, in no acute distress  neck: Mallampati 2+, circumference  14.5  inches  Neurological examination  Mentation: Alert oriented to time, place, history taking. Follows all commands speech and language fluent Cranial nerve II-XII: Pupils were equal round reactive to light. Extraocular movements were full, visual field were full on confrontational test. Facial sensation and strength were normal. Uvula tongue midline. Head turning and shoulder shrug  were normal and symmetric. Motor: The motor testing reveals 5 over 5 strength of all 4 extremities. Good symmetric motor tone is noted throughout.  Sensory: Sensory testing is intact to soft touch on all 4 extremities. No evidence of extinction is noted.  Coordination: Cerebellar testing reveals good finger-nose-finger and heel-to-shin bilaterally.  Gait and station: Gait is normal. Tandem gait is  Slightly unsteady. Romberg is negative. No drift is seen.  Reflexes: Deep tendon reflexes are symmetric and normal bilaterally.   DIAGNOSTIC DATA (LABS, IMAGING, TESTING) - I reviewed patient records, labs, notes, testing and imaging myself where available.  Lab Results  Component Value Date   WBC 7.9 02/09/2015   HGB 12.9 02/09/2015   HCT 38.0 02/09/2015   MCV 83.7 02/09/2015   PLT 276 02/09/2015      Component Value Date/Time   NA 139 02/09/2015 1305   K 4.5 02/09/2015 1305   CL 104 02/09/2015 1305   CO2 28 02/09/2015 1251   GLUCOSE 119* 02/09/2015 1305   BUN 14 02/09/2015 1305   CREATININE 1.00 02/09/2015 1305   CALCIUM 9.4  02/09/2015 1251   PROT 6.9 08/08/2013 0600   ALBUMIN 3.2* 08/08/2013 0600   AST 18 08/08/2013 0600   ALT 13 08/08/2013 0600   ALKPHOS 62 08/08/2013 0600   BILITOT 0.2* 08/08/2013 0600   GFRNONAA >60 02/09/2015 1251   GFRAA >60 02/09/2015 1251   Lab Results  Component Value Date   CHOL 153 08/04/2014   HDL 49 08/04/2014   LDLCALC 93 08/04/2014   TRIG 57 08/04/2014   CHOLHDL 3.1 08/04/2014   ASSESSMENT AND PLAN 74 y.o. year old female  has a past medical history of Asthma; Reflux; Hiatal hernia; Peripheral vascular disease ([place]); Hypertension; Hyperlipidemia; Iliac artery aneurysm ([place]); 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[place]); Aneurysm of common iliac artery ([place]) (sept. 2009); Carotid artery occlusion; Myocardial infarction [facility]) (01/01/2000); COPD (chronic obstructive pulmonary disease) ([place]); Coronary artery disease; Arnold-Chiari malformation ([place]) (1998); Bilateral occipital neuralgia (05/28/2013); Gastroesophageal reflux disease; Complication of anesthesia; and PONV (postoperative nausea and vomiting). here with:   1. Headaches   2. Sleep disturbance  3. History of cerebral aneurysm   Patient continues to have ongoing headaches. However at this time she feels that they are manageable and does not want to try any additional medication. The patient states that she is very apprehensive about medication and would rather wait to see if her headaches get any worse before starting something new. The patient also has some sleep disruptions such as waking up feeling like she is "strangling."  I recommended a potential referral for possible sleep study however the patient would like to wait at this  time. She states that her son will be having brain surgery and she would like to  Consider this after his surgery. The patient is encouraged to keep her follow-ups with Dr. Estanislado Pandy for monitoring of her cerebral brain aneurysm. Patient verbalized understanding. She was advised that if her symptoms worsen  or she develops any new symptoms she should let us know. She will follow-up in 4-5 months with Dr. Jannifer Franklin.   Ward Givens, MSN, NP-C 06/13/2015, 9:29 AM [facility] [address] [place], [place] [address] [phone]

## 2015-06-13 NOTE — Progress Notes (Signed)
I have read the note, and I agree with the clinical assessment and plan.  Mariza Bourget KEITH   

## 2015-06-13 NOTE — Patient Instructions (Signed)
Potential sleep study in the future.  If headache frequency increase let me know.  Continue with regular follow-ups with Dr. Estanislado Pandy.  If your symptoms worsen or you develop new symptoms please let us know.

## 2015-06-29 ENCOUNTER — Other Ambulatory Visit (HOSPITAL_COMMUNITY): Payer: Self-pay | Admitting: Interventional Radiology

## 2015-06-29 DIAGNOSIS — I671 Cerebral aneurysm, nonruptured: Secondary | ICD-10-CM

## 2015-07-15 ENCOUNTER — Encounter: Payer: Self-pay | Admitting: Family

## 2015-07-19 ENCOUNTER — Ambulatory Visit (HOSPITAL_COMMUNITY): Payer: Medicare HMO

## 2015-07-20 ENCOUNTER — Ambulatory Visit (INDEPENDENT_AMBULATORY_CARE_PROVIDER_SITE_OTHER): Payer: Medicare HMO | Admitting: Family

## 2015-07-20 ENCOUNTER — Encounter: Payer: Self-pay | Admitting: Family

## 2015-07-20 ENCOUNTER — Ambulatory Visit (HOSPITAL_COMMUNITY)
Admission: RE | Admit: 2015-07-20 | Discharge: 2015-07-20 | Disposition: A | Discharge status: Home / Self Care | Payer: Medicare HMO | Source: Ambulatory Visit | Attending: Family | Admitting: Family

## 2015-07-20 VITALS — BP 128/70 | HR 57 | Ht 62.0 in | Wt 172.8 lb

## 2015-07-20 DIAGNOSIS — M542 Cervicalgia: Secondary | ICD-10-CM | POA: Insufficient documentation

## 2015-07-20 DIAGNOSIS — Z6831 Body mass index (BMI) 31.0-31.9, adult: Secondary | ICD-10-CM | POA: Diagnosis not present

## 2015-07-20 DIAGNOSIS — I6521 Occlusion and stenosis of right carotid artery: Secondary | ICD-10-CM | POA: Diagnosis not present

## 2015-07-20 DIAGNOSIS — Z9889 Other specified postprocedural states: Secondary | ICD-10-CM | POA: Diagnosis not present

## 2015-07-20 DIAGNOSIS — I10 Essential (primary) hypertension: Secondary | ICD-10-CM | POA: Diagnosis not present

## 2015-07-20 DIAGNOSIS — Z48812 Encounter for surgical aftercare following surgery on the circulatory system: Secondary | ICD-10-CM | POA: Diagnosis not present

## 2015-07-20 DIAGNOSIS — E669 Obesity, unspecified: Secondary | ICD-10-CM

## 2015-07-20 DIAGNOSIS — E785 Hyperlipidemia, unspecified: Secondary | ICD-10-CM | POA: Insufficient documentation

## 2015-07-20 NOTE — Patient Instructions (Signed)
Stroke Prevention Some medical conditions and behaviors are associated with an increased chance of having a stroke. You may prevent a stroke by making healthy choices and managing medical conditions. HOW CAN I REDUCE MY RISK OF HAVING A STROKE?   Stay physically active. Get at least 30 minutes of activity on most or all days.  Do not smoke. It may also be helpful to avoid exposure to secondhand smoke.  Limit alcohol use. Moderate alcohol use is considered to be:  No more than 2 drinks per day for men.  No more than 1 drink per day for nonpregnant women.  Eat healthy foods. This involves:  Eating 5 or more servings of fruits and vegetables a day.  Making dietary changes that address high blood pressure (hypertension), high cholesterol, diabetes, or obesity.  Manage your cholesterol levels.  Making food choices that are high in fiber and low in saturated fat, trans fat, and cholesterol may control cholesterol levels.  Take any prescribed medicines to control cholesterol as directed by your health care provider.  Manage your diabetes.  Controlling your carbohydrate and sugar intake is recommended to manage diabetes.  Take any prescribed medicines to control diabetes as directed by your health care provider.  Control your hypertension.  Making food choices that are low in salt (sodium), saturated fat, trans fat, and cholesterol is recommended to manage hypertension.  Ask your health care provider if you need treatment to lower your blood pressure. Take any prescribed medicines to control hypertension as directed by your health care provider.  If you are 18-39 years of age, have your blood pressure checked every 3-5 years. If you are 40 years of age or older, have your blood pressure checked every year.  Maintain a healthy weight.  Reducing calorie intake and making food choices that are low in sodium, saturated fat, trans fat, and cholesterol are recommended to manage  weight.  Stop drug abuse.  Avoid taking birth control pills.  Talk to your health care provider about the risks of taking birth control pills if you are over 35 years old, smoke, get migraines, or have ever had a blood clot.  Get evaluated for sleep disorders (sleep apnea).  Talk to your health care provider about getting a sleep evaluation if you snore a lot or have excessive sleepiness.  Take medicines only as directed by your health care provider.  For some people, aspirin or blood thinners (anticoagulants) are helpful in reducing the risk of forming abnormal blood clots that can lead to stroke. If you have the irregular heart rhythm of atrial fibrillation, you should be on a blood thinner unless there is a good reason you cannot take them.  Understand all your medicine instructions.  Make sure that other conditions (such as anemia or atherosclerosis) are addressed. SEEK IMMEDIATE MEDICAL CARE IF:   You have sudden weakness or numbness of the face, arm, or leg, especially on one side of the body.  Your face or eyelid droops to one side.  You have sudden confusion.  You have trouble speaking (aphasia) or understanding.  You have sudden trouble seeing in one or both eyes.  You have sudden trouble walking.  You have dizziness.  You have a loss of balance or coordination.  You have a sudden, severe headache with no known cause.  You have new chest pain or an irregular heartbeat. Any of these symptoms may represent a serious problem that is an emergency. Do not wait to see if the symptoms will   go away. Get medical help at once. Call your local emergency services (911 in [place] Do not drive yourself to the hospital.   This information is not intended to replace advice given to you by your health care provider. Make sure you discuss any questions you have with your health care provider.   Document Released: 09/06/2004 Document Revised: 08/20/2014 Document Reviewed:  01/30/2013 Elsevier Interactive Patient Education 2016 [employer]  

## 2015-07-20 NOTE — Progress Notes (Signed)
Chief Complaint: Extracranial Carotid Artery Stenosis   History of Present Illness  [name] is a 74 y.o. female patient of Dr. Scot Dock who is s/p left carotid endarterectomy in August of 2011. We have been following a moderate right carotid stenosis. She comes in for a yearly follow up visit.   The patient has no history of TIA or stroke symptoms.Specifically the patient denies a history of amaurosis fugax or monocular blindness, unilateral facial drooping, hemiplegia, or receptive or expressive aphasia.   She denies claudication type symptoms in her legs with walking, denies non healing wounds. She does complain that her legs feel like they will not hold her up when standing. She has had a syncopal episode in 1998, lost vision temporarily, and May 2015 she had a similar pre-syncopal episode; she continues to have recurring dizziness. Patient denies that dizziness is worse with raising arms above head. Patient denies tingling, numbness, or weakness in either hand or arm. Has been seeing Dr. Jannifer Franklin s/p craniotomy for Arnold-Chiari malformation. Had a head MRI and MRA of her head, requested by Dr. Estanislado Pandy, interventional radiologist, per pt. In February or March 2016 due to pt's son, brother, and sister having cerebral aneurysms. Son had ruptured cerebral aneurysm and stroke in September 2015. Was hospitalized at [facility] Dec., 2014 for dizziness, falling, evaluation was inconclusive per pt.  Pt Diabetic: No Pt smoker: non-smoker  Pt meds include: Statin : Yes ASA: Yes, 81 mg Other anticoagulants/antiplatelets: Plavix   Past Medical History  Diagnosis Date  . Asthma   . Reflux   . Hiatal hernia   . Peripheral vascular disease ([place])   . Hypertension   . Hyperlipidemia   . Iliac artery aneurysm ([place])   . 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 Carotid artery occlusion   . Myocardial infarction [facility]) 01/01/2000    Cardiac  catheterization  . COPD (chronic obstructive pulmonary disease) ([place])   . Coronary artery disease   . Arnold-Chiari malformation ([place]) 1998  . 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  . Gastroesophageal reflux disease   . Complication of anesthesia   . PONV (postoperative nausea and vomiting)     occassionally    Social History Social History  Substance Use Topics  . Smoking status: Former Smoker -- 1.00 packs/day for 30 years    Types: Cigarettes    Quit date: 08/13/2000  . Smokeless tobacco: Never Used  . Alcohol Use: No    Family History Family History  Problem Relation Age of Onset  . Heart disease Mother     Heart Disease before age 34  . Hypertension Mother   . Hyperlipidemia Mother   . Heart attack Mother   . Clotting disorder Mother   . Heart disease Father     Heart Disease before age 16  . Heart attack Father   . Hyperlipidemia Father   . Hypertension Father   . Heart disease Brother     Heart Disease before age 4  . Hyperlipidemia Brother   . Hypertension Brother   . Clotting disorder Brother   . Cerebral aneurysm Sister   . Hypertension Sister   . Asthma Sister   . Cerebral aneurysm Brother   . Cancer Brother     Lung  . Hypertension Brother   . Heart attack Brother   . Heart disease Brother     Aneurysm of Brain  . Hypertension Brother   . Heart disease Brother   .  Heart disease Brother   . Stroke Son     Aneurysm of Stomach    Surgical History Past Surgical History  Procedure Laterality Date  . Eye surgery      Laser surgery for retinal hemorrhage  . Rotator cuff repair      Right  . Coronary artery bypass graft  01/01/2000  . Post coronary artery  bpg  01/05/2000    Right jugular sheath removed  . Carotid endarterectomy  03/29/2010    Left  CEA  . Pr vein bypass graft,aorto-fem-pop    . Arnold-chiari malformation repair  1998    Suboccipital craniectomy  . Corneal transplant      Right  . Appendectomy    . Abdominal hysterectomy     . Left heart catheterization with coronary angiogram N/A 08/03/2014    Procedure: LEFT HEART CATHETERIZATION WITH CORONARY ANGIOGRAM;  Surgeon: Birdie Riddle, MD;  Location: [place] CATH [facility];  Service: Cardiovascular;  Laterality: N/A;  . Cholecystectomy      Gall Bladder  . Colonoscopy with propofol N/A 04/22/2015    Procedure: COLONOSCOPY WITH PROPOFOL;  Surgeon: Carol Ada, MD;  Location: [facility];  Service: Endoscopy;  Laterality: N/A;    Allergies  Allergen Reactions  . Hydromorphone Palpitations and Other (See Comments)    DILAUDID  -  Pt had a Heart Attack after taking Dilaudid.  . Codeine Other (See Comments)    Dr. Terrence Dupont advised patient not to take this medication  . Oxycodone-Acetaminophen Other (See Comments)    Says it makes her feel weird  . Risedronate Other (See Comments)    Chest pain  . Avelox [Moxifloxacin Hcl In Nacl] Palpitations    Current Outpatient Prescriptions  Medication Sig Dispense Refill  . acetaminophen (TYLENOL) 325 MG tablet Take 2 tablets (650 mg total) by mouth every 6 (six) hours as needed for moderate pain. 30 tablet 0  . albuterol (PROVENTIL) (2.5 MG/3ML) 0.083% nebulizer solution Take 2.5 mg by nebulization every 4 (four) hours as needed for wheezing or shortness of breath (((PLAN C))).     . albuterol (VENTOLIN HFA) 108 (90 BASE) MCG/ACT inhaler Inhale 2 puffs into the lungs every 4 (four) hours as needed for wheezing or shortness of breath (((PLAN B))). 1 Inhaler 11  . ALPRAZolam (XANAX) 0.25 MG tablet Take 0.25 mg by mouth 2 (two) times daily as needed for anxiety.     Marland Kitchen aspirin EC 81 MG tablet Take 81 mg by mouth daily.     . AZOR 5-40 MG per tablet Take 1 tablet by mouth every morning.  3  . cetirizine (ZYRTEC) 1 MG/ML syrup Take 5 mg by mouth daily as needed (For allergies.).     Marland Kitchen cloNIDine (CATAPRES) 0.1 MG tablet Take 0.05 mg by mouth 2 (two) times daily.     . clopidogrel (PLAVIX) 75 MG tablet Take 1 tablet (75 mg total) by mouth  daily. 90 tablet 3  . dexlansoprazole (DEXILANT) 60 MG capsule Take 1 capsule (60 mg total) by mouth daily before breakfast. 30 capsule 11  . nitroGLYCERIN (NITRODUR - DOSED IN MG/24 HR) 0.4 mg/hr patch Place 0.4 mg onto the skin daily.     . nitroGLYCERIN (NITROSTAT) 0.4 MG SL tablet Place 0.4 mg under the tongue continuous as needed for chest pain.     . prednisoLONE acetate (PRED FORTE) 1 % ophthalmic suspension Place 2 drops into the right eye at bedtime.     . rosuvastatin (CRESTOR) 10 MG tablet Take  10 mg by mouth at bedtime.      No current facility-administered medications for this visit.    Review of Systems : See HPI for pertinent positives and negatives.  Physical Examination  Filed Vitals:   07/20/15 0915 07/20/15 0917  BP: 131/65 128/70  Pulse: 57   Height: '5\' 2"'$  (1.575 m)   Weight: 172 lb 12.8 oz (78.382 kg)   SpO2: 98%     Body mass index is 31.6 kg/(m^2).  General: WDWN obese female in NAD GAIT: normal Eyes: PERRLA Pulmonary: Non-labored, CTAB, no rales,  rhonchi, or wheezing.  Cardiac: regular rhythm, no detected murmur.  VASCULAR EXAM Carotid Bruits Left Right   negative positive   Aorta is not palpable. Radial pulses are 1+ right and 2+ left palpable.      LE Pulses LEFT RIGHT   POPLITEAL not palpable  not palpable   POSTERIOR TIBIAL not palpable  not palpable    DORSALIS PEDIS  ANTERIOR TIBIAL 2+ palpable  2+ palpable     Gastrointestinal: soft, nontender, BS WNL, no r/g,no palpable masses.  Musculoskeletal: Negative muscle atrophy/wasting. M/S 5/5 in UE's, 4/5 in LE's, Extremities without ischemic changes.  Neurologic: A&O X 3; Appropriate Affect, Speech is normal CN 2-12 intact, Pain and light touch intact in extremities, Motor exam as listed above.                 Non-Invasive Vascular Imaging CAROTID DUPLEX 07/20/2015   Right ICA: 40 - 59 % stenosis. Left ICA: patent CEA site with no evidence of restenosis. No significant change compared to 01/05/15.    Assessment: [name] is a 74 y.o. female who is s/p left carotid endarterectomy in August of 2011. She has no history of stroke or TIA.  Today's carotid duplex suggests 40-59% right ICA stenosis and a patent left CEA site with no restenosis.    Plan: Follow-up in 1 year with Carotid Duplex scan.   I discussed in depth with the patient the nature of atherosclerosis, and emphasized the importance of maximal medical management including strict control of blood pressure, blood glucose, and lipid levels, obtaining regular exercise, and continued cessation of smoking.  The patient is aware that without maximal medical management the underlying atherosclerotic disease process will progress, limiting the benefit of any interventions. The patient was given information about stroke prevention and what symptoms should prompt the patient to seek immediate medical care. Thank you for allowing [place] to participate in this patient's care.  Clemon Chambers, RN, MSN, FNP-C [facility] Office: [phone]  Clinic Physician: Scot Dock  07/20/2015 9:17 AM

## 2015-08-07 ENCOUNTER — Emergency Department (HOSPITAL_COMMUNITY): Payer: Medicare HMO

## 2015-08-07 ENCOUNTER — Encounter (HOSPITAL_COMMUNITY): Payer: Self-pay | Admitting: *Deleted

## 2015-08-07 ENCOUNTER — Emergency Department (HOSPITAL_COMMUNITY)
Admission: EM | Admit: 2015-08-07 | Discharge: 2015-08-07 | Disposition: A | Discharge status: Home / Self Care | Payer: Medicare HMO | Attending: Emergency Medicine | Admitting: Emergency Medicine

## 2015-08-07 DIAGNOSIS — Z79899 Other long term (current) drug therapy: Secondary | ICD-10-CM | POA: Insufficient documentation

## 2015-08-07 DIAGNOSIS — I252 Old myocardial infarction: Secondary | ICD-10-CM | POA: Insufficient documentation

## 2015-08-07 DIAGNOSIS — R6883 Chills (without fever): Secondary | ICD-10-CM | POA: Insufficient documentation

## 2015-08-07 DIAGNOSIS — Z9889 Other specified postprocedural states: Secondary | ICD-10-CM | POA: Diagnosis not present

## 2015-08-07 DIAGNOSIS — R112 Nausea with vomiting, unspecified: Secondary | ICD-10-CM | POA: Insufficient documentation

## 2015-08-07 DIAGNOSIS — Z7982 Long term (current) use of aspirin: Secondary | ICD-10-CM | POA: Diagnosis not present

## 2015-08-07 DIAGNOSIS — Q0700 Arnold-Chiari syndrome without spina bifida or hydrocephalus: Secondary | ICD-10-CM | POA: Insufficient documentation

## 2015-08-07 DIAGNOSIS — Z7902 Long term (current) use of antithrombotics/antiplatelets: Secondary | ICD-10-CM | POA: Insufficient documentation

## 2015-08-07 DIAGNOSIS — Z8739 Personal history of other diseases of the musculoskeletal system and connective tissue: Secondary | ICD-10-CM | POA: Insufficient documentation

## 2015-08-07 DIAGNOSIS — R197 Diarrhea, unspecified: Secondary | ICD-10-CM | POA: Diagnosis not present

## 2015-08-07 DIAGNOSIS — J449 Chronic obstructive pulmonary disease, unspecified: Secondary | ICD-10-CM | POA: Insufficient documentation

## 2015-08-07 DIAGNOSIS — K219 Gastro-esophageal reflux disease without esophagitis: Secondary | ICD-10-CM | POA: Diagnosis not present

## 2015-08-07 DIAGNOSIS — R1031 Right lower quadrant pain: Secondary | ICD-10-CM | POA: Diagnosis not present

## 2015-08-07 DIAGNOSIS — I10 Essential (primary) hypertension: Secondary | ICD-10-CM | POA: Insufficient documentation

## 2015-08-07 DIAGNOSIS — Z87891 Personal history of nicotine dependence: Secondary | ICD-10-CM | POA: Insufficient documentation

## 2015-08-07 DIAGNOSIS — Z7952 Long term (current) use of systemic steroids: Secondary | ICD-10-CM | POA: Diagnosis not present

## 2015-08-07 DIAGNOSIS — Z951 Presence of aortocoronary bypass graft: Secondary | ICD-10-CM | POA: Insufficient documentation

## 2015-08-07 DIAGNOSIS — I2510 Atherosclerotic heart disease of native coronary artery without angina pectoris: Secondary | ICD-10-CM | POA: Insufficient documentation

## 2015-08-07 DIAGNOSIS — E785 Hyperlipidemia, unspecified: Secondary | ICD-10-CM | POA: Insufficient documentation

## 2015-08-07 DIAGNOSIS — R195 Other fecal abnormalities: Secondary | ICD-10-CM | POA: Insufficient documentation

## 2015-08-07 DIAGNOSIS — R109 Unspecified abdominal pain: Secondary | ICD-10-CM

## 2015-08-07 HISTORY — DX: Diverticulitis of intestine, part unspecified, without perforation or abscess without bleeding: K57.92

## 2015-08-07 LAB — URINE MICROSCOPIC-ADD ON

## 2015-08-07 LAB — URINALYSIS, ROUTINE W REFLEX MICROSCOPIC
Glucose, UA: NEGATIVE mg/dL
KETONES UR: 15 mg/dL — AB
Leukocytes, UA: NEGATIVE
Nitrite: NEGATIVE
PROTEIN: NEGATIVE mg/dL
Specific Gravity, Urine: 1.024 (ref 1.005–1.030)
pH: 5 (ref 5.0–8.0)

## 2015-08-07 LAB — COMPREHENSIVE METABOLIC PANEL
ALBUMIN: 3.7 g/dL (ref 3.5–5.0)
ALT: 12 U/L — ABNORMAL LOW (ref 14–54)
ANION GAP: 10 (ref 5–15)
AST: 21 U/L (ref 15–41)
Alkaline Phosphatase: 85 U/L (ref 38–126)
BILIRUBIN TOTAL: 0.6 mg/dL (ref 0.3–1.2)
BUN: 17 mg/dL (ref 6–20)
CHLORIDE: 107 mmol/L (ref 101–111)
CO2: 24 mmol/L (ref 22–32)
Calcium: 9.5 mg/dL (ref 8.9–10.3)
Creatinine, Ser: 1.11 mg/dL — ABNORMAL HIGH (ref 0.44–1.00)
GFR calc Af Amer: 56 mL/min — ABNORMAL LOW (ref >60)
GFR calc non Af Amer: 48 mL/min — ABNORMAL LOW (ref >60)
GLUCOSE: 145 mg/dL — AB (ref 65–99)
POTASSIUM: 4.5 mmol/L (ref 3.5–5.1)
SODIUM: 141 mmol/L (ref 135–145)
TOTAL PROTEIN: 7.9 g/dL (ref 6.5–8.1)

## 2015-08-07 LAB — CBC
HEMATOCRIT: 38.2 % (ref 36.0–46.0)
HEMOGLOBIN: 12.3 g/dL (ref 12.0–15.0)
MCH: 27.8 pg (ref 26.0–34.0)
MCHC: 32.2 g/dL (ref 30.0–36.0)
MCV: 86.4 fL (ref 78.0–100.0)
Platelets: 269 10*3/uL (ref 150–400)
RBC: 4.42 MIL/uL (ref 3.87–5.11)
RDW: 14.4 % (ref 11.5–15.5)
WBC: 11.4 10*3/uL — ABNORMAL HIGH (ref 4.0–10.5)

## 2015-08-07 LAB — POC OCCULT BLOOD, ED: FECAL OCCULT BLD: NEGATIVE

## 2015-08-07 LAB — LIPASE, BLOOD: LIPASE: 68 U/L — AB (ref 11–51)

## 2015-08-07 MED ORDER — IOHEXOL 300 MG/ML  SOLN: 25.0000 mL | Freq: Once | INTRAMUSCULAR | Status: DC | PRN

## 2015-08-07 MED ORDER — METOCLOPRAMIDE HCL 5 MG/ML IJ SOLN
10.0000 mg | Freq: Once | INTRAMUSCULAR | Status: AC
  Administered 2015-08-07: 10 mg via INTRAVENOUS
  Filled 2015-08-07: qty 2

## 2015-08-07 MED ORDER — PROMETHAZINE HCL 12.5 MG PO TABS: 12.5000 mg | ORAL_TABLET | Freq: Four times a day (QID) | ORAL | Status: DC | PRN

## 2015-08-07 MED ORDER — SODIUM CHLORIDE 0.9 % IV BOLUS (SEPSIS)
1000.0000 mL | Freq: Once | INTRAVENOUS | Status: AC
  Administered 2015-08-07: 1000 mL via INTRAVENOUS

## 2015-08-07 MED ORDER — ONDANSETRON 4 MG PO TBDP: 4.0000 mg | ORAL_TABLET | Freq: Once | ORAL | Status: DC | PRN

## 2015-08-07 MED ORDER — IOHEXOL 300 MG/ML  SOLN
90.0000 mL | Freq: Once | INTRAMUSCULAR | Status: AC | PRN
  Administered 2015-08-07: 90 mL via INTRAVENOUS

## 2015-08-07 MED ORDER — ONDANSETRON HCL 4 MG/2ML IJ SOLN
4.0000 mg | Freq: Once | INTRAMUSCULAR | Status: AC
  Administered 2015-08-07: 4 mg via INTRAVENOUS
  Filled 2015-08-07: qty 2

## 2015-08-07 MED ORDER — ACIDOPHILUS PROBIOTIC 10 MG PO TABS: 10.0000 mg | ORAL_TABLET | Freq: Three times a day (TID) | ORAL | Status: DC

## 2015-08-07 MED ORDER — ONDANSETRON 4 MG PO TBDP
ORAL_TABLET | ORAL | Status: DC
Start: 2015-08-07 — End: 2015-08-07
  Filled 2015-08-07: qty 1

## 2015-08-07 MED ORDER — ACETAMINOPHEN 500 MG PO TABS
1000.0000 mg | ORAL_TABLET | Freq: Once | ORAL | Status: AC
  Administered 2015-08-07: 1000 mg via ORAL
  Filled 2015-08-07: qty 2

## 2015-08-07 NOTE — ED Provider Notes (Signed)
CSN: [id number]     Arrival date & time 08/07/15  1025 History   First MD Initiated Contact with Patient 08/07/15 1125     Chief Complaint  Patient presents with  . Emesis  . Diarrhea   HPI  [name] is a 74 year old female with past medical history of hypertension, asthma, GERD, COPD, CAD and MI presenting with abdominal pain, vomiting and diarrhea. She reports a right-sided abdominal pain that has been intermittent for the past month. The pain acutely worsened last night at approximately 3 AM. She is unable to describe the pain, only says "it hurts really deep in there". She reports associated nausea and vomiting when the pain acutely worsened last evening. She also has had a few loose stools. She states that she has seen bright red blood in her stool over the past month. She denies black, tarry stools. The abdominal pain has been constant since this morning. Palpation makes the pain worse. She denies alleviating factors. She endorses feeling chills but no fevers. Denies headaches, dizziness, syncope, weakness, chest pain, shortness of breath, cough, dysuria, increased frequency of urination, myalgias or rash. She notes that she has a history of diverticulitis.   Past Medical History  Diagnosis Date  . Asthma   . Reflux   . Hiatal hernia   . Peripheral vascular disease ([place])   . Hypertension   . Hyperlipidemia   . Iliac artery aneurysm ([place])   . 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 Carotid artery occlusion   . Myocardial infarction [facility]) 01/01/2000    Cardiac catheterization  . COPD (chronic obstructive pulmonary disease) ([place])   . Coronary artery disease   . Arnold-Chiari malformation ([place]) 1998  . 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  . Gastroesophageal reflux disease   . Complication of anesthesia   . PONV (postoperative nausea and vomiting)     occassionally  . Diverticulitis    Past Surgical History  Procedure  Laterality Date  . Eye surgery      Laser surgery for retinal hemorrhage  . Rotator cuff repair      Right  . Coronary artery bypass graft  01/01/2000  . Post coronary artery  bpg  01/05/2000    Right jugular sheath removed  . Carotid endarterectomy  03/29/2010    Left  CEA  . Pr vein bypass graft,aorto-fem-pop    . Arnold-chiari malformation repair  1998    Suboccipital craniectomy  . Corneal transplant      Right  . Appendectomy    . Abdominal hysterectomy    . Left heart catheterization with coronary angiogram N/A 08/03/2014    Procedure: LEFT HEART CATHETERIZATION WITH CORONARY ANGIOGRAM;  Surgeon: Birdie Riddle, MD;  Location: [place] CATH LAB;  Service: Cardiovascular;  Laterality: N/A;  . Cholecystectomy      Gall Bladder  . Colonoscopy with propofol N/A 04/22/2015    Procedure: COLONOSCOPY WITH PROPOFOL;  Surgeon: Carol Ada, MD;  [facility];  Service: Endoscopy;  Laterality: N/A;   Family History  Problem Relation Age of Onset  . Heart disease Mother     Heart Disease before age 36  . Hypertension Mother   . Hyperlipidemia Mother   . Heart attack Mother   . Clotting disorder Mother   . Heart disease Father     Heart Disease before age 61  . Heart attack Father   . Hyperlipidemia Father   . Hypertension Father   .  Heart disease Brother     Heart Disease before age 37  . Hyperlipidemia Brother   . Hypertension Brother   . Clotting disorder Brother   . AAA (abdominal aortic aneurysm) Brother   . Cerebral aneurysm Sister   . Hypertension Sister   . AAA (abdominal aortic aneurysm) Sister   . Asthma Sister   . Cerebral aneurysm Brother   . Cancer Brother     Lung  . Hypertension Brother   . Heart attack Brother   . Heart disease Brother     Aneurysm of Brain  . Hypertension Brother   . Heart disease Brother   . Heart disease Brother   . Stroke Son     Aneurysm of Stomach  . AAA (abdominal aortic aneurysm) Son    Social History  Substance Use  Topics  . Smoking status: Former Smoker -- 1.00 packs/day for 30 years    Types: Cigarettes    Quit date: 08/13/2000  . Smokeless tobacco: Never Used  . Alcohol Use: No   OB History    No data available     Review of Systems  Constitutional: Positive for chills. Negative for fever.  Respiratory: Negative for cough and shortness of breath.   Cardiovascular: Negative for chest pain.  Gastrointestinal: Positive for nausea, vomiting, abdominal pain, diarrhea and blood in stool.  Genitourinary: Negative for dysuria and flank pain.  Musculoskeletal: Negative for myalgias and back pain.  Neurological: Negative for dizziness, syncope and headaches.  All other systems reviewed and are negative.     Allergies  Hydromorphone; Codeine; Oxycodone-acetaminophen; Risedronate; and Avelox  Home Medications   Prior to Admission medications   Medication Sig Start Date End Date Taking? Authorizing Provider  acetaminophen (TYLENOL) 325 MG tablet Take 2 tablets (650 mg total) by mouth every 6 (six) hours as needed for moderate pain. 02/09/15  Yes Carmin Muskrat, MD  albuterol (PROVENTIL) (2.5 MG/3ML) 0.083% nebulizer solution Take 2.5 mg by nebulization every 4 (four) hours as needed for wheezing or shortness of breath (((PLAN C))).    Yes Historical Provider, MD  albuterol (VENTOLIN HFA) 108 (90 BASE) MCG/ACT inhaler Inhale 2 puffs into the lungs every 4 (four) hours as needed for wheezing or shortness of breath (((PLAN B))). 08/09/13  Yes Pollie Friar, MD  ALPRAZolam Duanne Moron) 0.25 MG tablet Take 0.25 mg by mouth 2 (two) times daily as needed for anxiety.  03/05/14  Yes Historical Provider, MD  aspirin EC 81 MG tablet Take 81 mg by mouth daily.    Yes Historical Provider, MD  AZOR 5-40 MG per tablet Take 1 tablet by mouth every morning. 10/08/14  Yes Historical Provider, MD  cetirizine (ZYRTEC) 1 MG/ML syrup Take 5 mg by mouth daily as needed (For allergies.).    Yes Historical Provider, MD  cloNIDine  (CATAPRES) 0.1 MG tablet Take 0.05 mg by mouth 2 (two) times daily.    Yes Historical Provider, MD  clopidogrel (PLAVIX) 75 MG tablet Take 1 tablet (75 mg total) by mouth daily. 04/02/12  Yes Conrad Peru, MD  dexlansoprazole (DEXILANT) 60 MG capsule Take 1 capsule (60 mg total) by mouth daily before breakfast. 11/17/12  Yes Tanda Rockers, MD  prednisoLONE acetate (PRED FORTE) 1 % ophthalmic suspension Place 2 drops into the right eye at bedtime.  02/26/14  Yes Historical Provider, MD  rosuvastatin (CRESTOR) 10 MG tablet Take 10 mg by mouth at bedtime.    Yes Historical Provider, MD  Lactobacillus (ACIDOPHILUS PROBIOTIC) 10  MG TABS Take 10 mg by mouth 3 (three) times daily. 08/07/15   Waynetta Pean, PA-C  nitroGLYCERIN (NITRODUR - DOSED IN MG/24 HR) 0.4 mg/hr patch Place 0.4 mg onto the skin daily.  02/01/14   Historical Provider, MD  nitroGLYCERIN (NITROSTAT) 0.4 MG SL tablet Place 0.4 mg under the tongue continuous as needed for chest pain.     Historical Provider, MD  promethazine (PHENERGAN) 12.5 MG tablet Take 1 tablet (12.5 mg total) by mouth every 6 (six) hours as needed for nausea or vomiting. 08/07/15   Waynetta Pean, PA-C   BP 159/59 mmHg  Pulse 80  Temp(Src) 98.1 F (36.7 C) (Oral)  Resp 20  Ht '5\' 2"'$  (1.575 m)  Wt 76.204 kg  BMI 30.72 kg/m2  SpO2 99% Physical Exam  Constitutional: She appears well-developed and well-nourished. No distress.  HENT:  Head: Normocephalic and atraumatic.  Eyes: Conjunctivae are normal. Right eye exhibits no discharge. Left eye exhibits no discharge. No scleral icterus.  Neck: Normal range of motion. Neck supple.  Cardiovascular: Normal rate, regular rhythm and normal heart sounds.   Pulmonary/Chest: Effort normal and breath sounds normal. No respiratory distress.  Abdominal: Soft. Normal appearance. Bowel sounds are decreased. There is tenderness in the right lower quadrant. There is no rigidity, no rebound and no guarding.    Tenderness in right  lateral abdomen extending into RLQ as shown in diagram. No rebound, guarding or rigidity.  Genitourinary:  Multiple, non-thrombosed hemorrhoids on exam. Small amount of soft, brown stool noted on rectal exam. No frank blood. Hemoccult negative.   Musculoskeletal: Normal range of motion.  Moves all extremities spontaneously  Neurological: She is alert. Coordination normal.  Skin: Skin is warm and dry.  Psychiatric: She has a normal mood and affect. Her behavior is normal.  Nursing note and vitals reviewed.   ED Course  Procedures (including critical care time) Labs Review Labs Reviewed  LIPASE, BLOOD - Abnormal; Notable for the following:    Lipase 68 (*)    All other components within normal limits  COMPREHENSIVE METABOLIC PANEL - Abnormal; Notable for the following:    Glucose, Bld 145 (*)    Creatinine, Ser 1.11 (*)    ALT 12 (*)    GFR calc non Af Amer 48 (*)    GFR calc Af Amer 56 (*)    All other components within normal limits  CBC - Abnormal; Notable for the following:    WBC 11.4 (*)    All other components within normal limits  URINALYSIS, ROUTINE W REFLEX MICROSCOPIC (NOT AT [facility]) - Abnormal; Notable for the following:    Color, Urine AMBER (*)    APPearance CLOUDY (*)    Hgb urine dipstick SMALL (*)    Bilirubin Urine SMALL (*)    Ketones, ur 15 (*)    All other components within normal limits  URINE MICROSCOPIC-ADD ON - Abnormal; Notable for the following:    Squamous Epithelial / LPF 0-5 (*)    Bacteria, UA RARE (*)    All other components within normal limits  POC OCCULT BLOOD, ED    Imaging Review Ct Abdomen Pelvis W Contrast  08/07/2015  CLINICAL DATA:  Right-sided abdominal pain for 1 month. Vomiting, diarrhea history of diverticulitis. EXAM: CT ABDOMEN AND PELVIS WITH CONTRAST TECHNIQUE: Multidetector CT imaging of the abdomen and pelvis was performed using the standard protocol following bolus administration of intravenous contrast. CONTRAST:  102m  OMNIPAQUE IOHEXOL 300 MG/ML  SOLN COMPARISON:  04/01/2015  FINDINGS: Lower chest: Calcified atherosclerotic disease of the coronary arteries. Partially visualized median sternotomy. Hepatobiliary: No masses or other significant abnormality. Postcholecystectomy. Pancreas: No mass, inflammatory changes, or other significant abnormality. Spleen: Within normal limits in size and appearance. Adrenals/Urinary Tract: No masses identified. No evidence of hydronephrosis. Stomach/Bowel: No evidence of obstruction, inflammatory process, or abnormal fluid collections. Appendix is surgically absent. Vascular/Lymphatic: No pathologically enlarged lymph nodes. No evidence of abdominal aortic aneurysm. Calcified and noncalcified atherosclerotic disease of the abdominal aorta and its main branches, including left renal artery. Bilateral common iliac arterial stents noted. Reproductive: Post hysterectomy. No suspicious masses are seen in the pelvis. Other: None. Musculoskeletal: No suspicious bone lesions identified. Osteoarthritic changes of the lumbosacral spine. IMPRESSION: Atherosclerotic disease of the coronary arteries, abdominal aorta and its main branches. No significant abnormalities within the solid abdominal organs. Nonobstructive bowel gas pattern. Electronically Signed   By: Fidela Salisbury M.D.   On: 08/07/2015 17:28   I have personally reviewed and evaluated these images and lab results as part of my medical decision-making.   EKG Interpretation None      MDM   Final diagnoses:  Right sided abdominal pain  Nausea vomiting and diarrhea   74 year old female presenting with right sided abdominal pain, nausea, vomiting and diarrhea. Abdominal pain has been intermittent for the past month with acute worsening 6 hours PTA. Pt also notes BRB in her stool. Pt is afebrile in NAD. Abdomen is soft with tenderness over right lateral abdomen and RLQ. No peritoneal signs. On rectal, multiple hemorrhoids noted.  Likely source of bright red blood in her stool. Given tylenol, reglan and IVF. WBC elevated to 11.4. Lipase mildly elevated. Creatinine slightly elevated to 1.11. Likely secondary to dehydration. UA not suggestive of UTI. Will order abd/pelvis CT. Patient care signed out to Will Dansie, PA-C. Dispo pending abdominal CT.     Josephina Gip, PA-C 08/09/15 [place], MD 08/10/15 [phone]

## 2015-08-07 NOTE — ED Notes (Signed)
Pt transporting to CT at this time.

## 2015-08-07 NOTE — ED Notes (Addendum)
Pt with vomiting and diarrhea since 3 am.  Hx of diverticulitis and states RLQ hurts.

## 2015-08-07 NOTE — Discharge Instructions (Signed)
Abdominal Pain, Adult Many things can cause abdominal pain. Usually, abdominal pain is not caused by a disease and will improve without treatment. It can often be observed and treated at home. Your health care provider will do a physical exam and possibly order blood tests and X-rays to help determine the seriousness of your pain. However, in many cases, more time must pass before a clear cause of the pain can be found. Before that point, your health care provider may not know if you need more testing or further treatment. HOME CARE INSTRUCTIONS Monitor your abdominal pain for any changes. The following actions may help to alleviate any discomfort you are experiencing:  Only take over-the-counter or prescription medicines as directed by your health care provider.  Do not take laxatives unless directed to do so by your health care provider.  Try a clear liquid diet (broth, tea, or water) as directed by your health care provider. Slowly move to a bland diet as tolerated. SEEK MEDICAL CARE IF:  You have unexplained abdominal pain.  You have abdominal pain associated with nausea or diarrhea.  You have pain when you urinate or have a bowel movement.  You experience abdominal pain that wakes you in the night.  You have abdominal pain that is worsened or improved by eating food.  You have abdominal pain that is worsened with eating fatty foods.  You have a fever. SEEK IMMEDIATE MEDICAL CARE IF:  Your pain does not go away within 2 hours.  You keep throwing up (vomiting).  Your pain is felt only in portions of the abdomen, such as the right side or the left lower portion of the abdomen.  You pass bloody or black tarry stools. MAKE SURE YOU:  Understand these instructions.  Will watch your condition.  Will get help right away if you are not doing well or get worse.   This information is not intended to replace advice given to you by your health care provider. Make sure you discuss  any questions you have with your health care provider.   Document Released: 05/09/2005 Document Revised: 04/20/2015 Document Reviewed: 04/08/2013 Elsevier Interactive Patient Education 2016 [employer] Nausea and Vomiting Nausea is a sick feeling that often comes before throwing up (vomiting). Vomiting is a reflex where stomach contents come out of your mouth. Vomiting can cause severe loss of body fluids (dehydration). Children and elderly adults can become dehydrated quickly, especially if they also have diarrhea. Nausea and vomiting are symptoms of a condition or disease. It is important to find the cause of your symptoms. CAUSES   Direct irritation of the stomach lining. This irritation can result from increased acid production (gastroesophageal reflux disease), infection, food poisoning, taking certain medicines (such as nonsteroidal anti-inflammatory drugs), alcohol use, or tobacco use.  Signals from the brain.These signals could be caused by a headache, heat exposure, an inner ear disturbance, increased pressure in the brain from injury, infection, a tumor, or a concussion, pain, emotional stimulus, or metabolic problems.  An obstruction in the gastrointestinal tract (bowel obstruction).  Illnesses such as diabetes, hepatitis, gallbladder problems, appendicitis, kidney problems, cancer, sepsis, atypical symptoms of a heart attack, or eating disorders.  Medical treatments such as chemotherapy and radiation.  Receiving medicine that makes you sleep (general anesthetic) during surgery. DIAGNOSIS Your caregiver may ask for tests to be done if the problems do not improve after a few days. Tests may also be done if symptoms are severe or if the reason for the nausea  and vomiting is not clear. Tests may include:  Urine tests.  Blood tests.  Stool tests.  Cultures (to look for evidence of infection).  X-rays or other imaging studies. Test results can help your caregiver make  decisions about treatment or the need for additional tests. TREATMENT You need to stay well hydrated. Drink frequently but in small amounts.You may wish to drink water, sports drinks, clear broth, or eat frozen ice pops or gelatin dessert to help stay hydrated.When you eat, eating slowly may help prevent nausea.There are also some antinausea medicines that may help prevent nausea. HOME CARE INSTRUCTIONS   Take all medicine as directed by your caregiver.  If you do not have an appetite, do not force yourself to eat. However, you must continue to drink fluids.  If you have an appetite, eat a normal diet unless your caregiver tells you differently.  Eat a variety of complex carbohydrates (rice, wheat, potatoes, bread), lean meats, yogurt, fruits, and vegetables.  Avoid high-fat foods because they are more difficult to digest.  Drink enough water and fluids to keep your urine clear or pale yellow.  If you are dehydrated, ask your caregiver for specific rehydration instructions. Signs of dehydration may include:  Severe thirst.  Dry lips and mouth.  Dizziness.  Dark urine.  Decreasing urine frequency and amount.  Confusion.  Rapid breathing or pulse. SEEK IMMEDIATE MEDICAL CARE IF:   You have blood or brown flecks (like coffee grounds) in your vomit.  You have black or bloody stools.  You have a severe headache or stiff neck.  You are confused.  You have severe abdominal pain.  You have chest pain or trouble breathing.  You do not urinate at least once every 8 hours.  You develop cold or clammy skin.  You continue to vomit for longer than 24 to 48 hours.  You have a fever. MAKE SURE YOU:   Understand these instructions.  Will watch your condition.  Will get help right away if you are not doing well or get worse.   This information is not intended to replace advice given to you by your health care provider. Make sure you discuss any questions you have with  your health care provider.   Document Released: 07/30/2005 Document Revised: 10/22/2011 Document Reviewed: 12/27/2010 Elsevier Interactive Patient Education 2016 [employer]. Diarrhea Diarrhea is frequent loose and watery bowel movements. It can cause you to feel weak and dehydrated. Dehydration can cause you to become tired and thirsty, have a dry mouth, and have decreased urination that often is dark yellow. Diarrhea is a sign of another problem, most often an infection that will not last long. In most cases, diarrhea typically lasts 2-3 days. However, it can last longer if it is a sign of something more serious. It is important to treat your diarrhea as directed by your caregiver to lessen or prevent future episodes of diarrhea. CAUSES  Some common causes include:  Gastrointestinal infections caused by viruses, bacteria, or parasites.  Food poisoning or food allergies.  Certain medicines, such as antibiotics, chemotherapy, and laxatives.  Artificial sweeteners and fructose.  Digestive disorders. HOME CARE INSTRUCTIONS  Ensure adequate fluid intake (hydration): Have 1 cup (8 oz) of fluid for each diarrhea episode. Avoid fluids that contain simple sugars or sports drinks, fruit juices, whole milk products, and sodas. Your urine should be clear or pale yellow if you are drinking enough fluids. Hydrate with an oral rehydration solution that you can purchase at pharmacies, retail  stores, and online. You can prepare an oral rehydration solution at home by mixing the following ingredients together:   - tsp table salt.   tsp baking soda.   tsp salt substitute containing potassium chloride.  1  tablespoons sugar.  1 L (34 oz) of water.  Certain foods and beverages may increase the speed at which food moves through the gastrointestinal (GI) tract. These foods and beverages should be avoided and include:  Caffeinated and alcoholic beverages.  High-fiber foods, such as raw fruits and  vegetables, nuts, seeds, and whole grain breads and cereals.  Foods and beverages sweetened with sugar alcohols, such as xylitol, sorbitol, and mannitol.  Some foods may be well tolerated and may help thicken stool including:  Starchy foods, such as rice, toast, pasta, low-sugar cereal, oatmeal, grits, baked potatoes, crackers, and bagels.  Bananas.  Applesauce.  Add probiotic-rich foods to help increase healthy bacteria in the GI tract, such as yogurt and fermented milk products.  Wash your hands well after each diarrhea episode.  Only take over-the-counter or prescription medicines as directed by your caregiver.  Take a warm bath to relieve any burning or pain from frequent diarrhea episodes. SEEK IMMEDIATE MEDICAL CARE IF:   You are unable to keep fluids down.  You have persistent vomiting.  You have blood in your stool, or your stools are black and tarry.  You do not urinate in 6-8 hours, or there is only a small amount of very dark urine.  You have abdominal pain that increases or localizes.  You have weakness, dizziness, confusion, or light-headedness.  You have a severe headache.  Your diarrhea gets worse or does not get better.  You have a fever or persistent symptoms for more than 2-3 days.  You have a fever and your symptoms suddenly get worse. MAKE SURE YOU:   Understand these instructions.  Will watch your condition.  Will get help right away if you are not doing well or get worse.   This information is not intended to replace advice given to you by your health care provider. Make sure you discuss any questions you have with your health care provider.   Document Released: 07/20/2002 Document Revised: 08/20/2014 Document Reviewed: 04/06/2012 Elsevier Interactive Patient Education [employer].

## 2015-08-07 NOTE — ED Provider Notes (Signed)
Patient's care was assumed from [facility], PA-C at shift change. Please see her note for further. She presented with right-sided abdominal pain for a month and nausea vomiting and diarrhea for today. At shift change the patient is awaiting CT scan. Plan is for discharge if CT scan is unremarkable. CT scan indicated no significant abnormalities in her abdomen and pelvis. Nonobstructive bowel gas pattern. My evaluation the patient reports she is doing much better. She is tolerated several cups of ginger ale. She has had no further nausea, vomiting or diarrhea. We'll discharge home. We'll discharge prescriptions for Phenergan and a probiotic. I encouraged close follow-up with her primary care provider and strict return precautions. I advised the patient to follow-up with their primary care provider this week. I advised the patient to return to the emergency department with new or worsening symptoms or new concerns. The patient verbalized understanding and agreement with plan.   Results for orders placed or performed during the hospital encounter of 08/07/15  Lipase, blood  Result Value Ref Range   Lipase 68 (H) 11 - 51 U/L  Comprehensive metabolic panel  Result Value Ref Range   Sodium 141 135 - 145 mmol/L   Potassium 4.5 3.5 - 5.1 mmol/L   Chloride 107 101 - 111 mmol/L   CO2 24 22 - 32 mmol/L   Glucose, Bld 145 (H) 65 - 99 mg/dL   BUN 17 6 - 20 mg/dL   Creatinine, Ser 1.11 (H) 0.44 - 1.00 mg/dL   Calcium 9.5 8.9 - 10.3 mg/dL   Total Protein 7.9 6.5 - 8.1 g/dL   Albumin 3.7 3.5 - 5.0 g/dL   AST 21 15 - 41 U/L   ALT 12 (L) 14 - 54 U/L   Alkaline Phosphatase 85 38 - 126 U/L   Total Bilirubin 0.6 0.3 - 1.2 mg/dL   GFR calc non Af Amer 48 (L) >60 mL/min   GFR calc Af Amer 56 (L) >60 mL/min   Anion gap 10 5 - 15  CBC  Result Value Ref Range   WBC 11.4 (H) 4.0 - 10.5 K/uL   RBC 4.42 3.87 - 5.11 MIL/uL   Hemoglobin 12.3 12.0 - 15.0 g/dL   HCT 38.2 36.0 - 46.0 %   MCV 86.4 78.0 - 100.0 fL    MCH 27.8 26.0 - 34.0 pg   MCHC 32.2 30.0 - 36.0 g/dL   RDW 14.4 11.5 - 15.5 %   Platelets 269 150 - 400 K/uL  Urinalysis, Routine w reflex microscopic (not at [facility])  Result Value Ref Range   Color, Urine AMBER (A) YELLOW   APPearance CLOUDY (A) CLEAR   Specific Gravity, Urine 1.024 1.005 - 1.030   pH 5.0 5.0 - 8.0   Glucose, UA NEGATIVE NEGATIVE mg/dL   Hgb urine dipstick SMALL (A) NEGATIVE   Bilirubin Urine SMALL (A) NEGATIVE   Ketones, ur 15 (A) NEGATIVE mg/dL   Protein, ur NEGATIVE NEGATIVE mg/dL   Nitrite NEGATIVE NEGATIVE   Leukocytes, UA NEGATIVE NEGATIVE  Urine microscopic-add on  Result Value Ref Range   Squamous Epithelial / LPF 0-5 (A) NONE SEEN   WBC, UA 0-5 0 - 5 WBC/hpf   RBC / HPF 0-5 0 - 5 RBC/hpf   Bacteria, UA RARE (A) NONE SEEN  POC occult blood, ED  Result Value Ref Range   Fecal Occult Bld NEGATIVE NEGATIVE   Ct Abdomen Pelvis W Contrast  08/07/2015  CLINICAL DATA:  Right-sided abdominal pain for 1 month. Vomiting,  diarrhea history of diverticulitis. EXAM: CT ABDOMEN AND PELVIS WITH CONTRAST TECHNIQUE: Multidetector CT imaging of the abdomen and pelvis was performed using the standard protocol following bolus administration of intravenous contrast. CONTRAST:  29m OMNIPAQUE IOHEXOL 300 MG/ML  SOLN COMPARISON:  04/01/2015 FINDINGS: Lower chest: Calcified atherosclerotic disease of the coronary arteries. Partially visualized median sternotomy. Hepatobiliary: No masses or other significant abnormality. Postcholecystectomy. Pancreas: No mass, inflammatory changes, or other significant abnormality. Spleen: Within normal limits in size and appearance. Adrenals/Urinary Tract: No masses identified. No evidence of hydronephrosis. Stomach/Bowel: No evidence of obstruction, inflammatory process, or abnormal fluid collections. Appendix is surgically absent. Vascular/Lymphatic: No pathologically enlarged lymph nodes. No evidence of abdominal aortic aneurysm. Calcified and  noncalcified atherosclerotic disease of the abdominal aorta and its main branches, including left renal artery. Bilateral common iliac arterial stents noted. Reproductive: Post hysterectomy. No suspicious masses are seen in the pelvis. Other: None. Musculoskeletal: No suspicious bone lesions identified. Osteoarthritic changes of the lumbosacral spine. IMPRESSION: Atherosclerotic disease of the coronary arteries, abdominal aorta and its main branches. No significant abnormalities within the solid abdominal organs. Nonobstructive bowel gas pattern. Electronically Signed   By: DFidela SalisburyM.D.   On: 08/07/2015 17:28      WWaynetta Pean PA-C 08/07/15 [place] MD 08/08/15 [phone]

## 2015-08-24 ENCOUNTER — Encounter (HOSPITAL_COMMUNITY): Payer: Self-pay

## 2015-08-24 ENCOUNTER — Ambulatory Visit (HOSPITAL_COMMUNITY)
Admission: RE | Admit: 2015-08-24 | Discharge: 2015-08-24 | Disposition: A | Discharge status: Home / Self Care | Payer: Medicare HMO | Source: Ambulatory Visit | Attending: Interventional Radiology | Admitting: Interventional Radiology

## 2015-08-24 DIAGNOSIS — I6523 Occlusion and stenosis of bilateral carotid arteries: Secondary | ICD-10-CM | POA: Diagnosis not present

## 2015-08-24 DIAGNOSIS — M47892 Other spondylosis, cervical region: Secondary | ICD-10-CM | POA: Diagnosis not present

## 2015-08-24 DIAGNOSIS — I739 Peripheral vascular disease, unspecified: Secondary | ICD-10-CM | POA: Insufficient documentation

## 2015-08-24 DIAGNOSIS — J984 Other disorders of lung: Secondary | ICD-10-CM | POA: Diagnosis not present

## 2015-08-24 DIAGNOSIS — I671 Cerebral aneurysm, nonruptured: Secondary | ICD-10-CM | POA: Diagnosis not present

## 2015-08-24 MED ORDER — IOHEXOL 350 MG/ML SOLN
50.0000 mL | Freq: Once | INTRAVENOUS | Status: AC | PRN
  Administered 2015-08-24: 50 mL via INTRAVENOUS

## 2015-10-22 ENCOUNTER — Encounter (HOSPITAL_COMMUNITY): Payer: Self-pay | Admitting: *Deleted

## 2015-10-22 ENCOUNTER — Emergency Department (HOSPITAL_COMMUNITY): Payer: Medicare HMO

## 2015-10-22 ENCOUNTER — Inpatient Hospital Stay (HOSPITAL_COMMUNITY)
Admission: EM | Admit: 2015-10-22 | Discharge: 2015-10-24 | DRG: 190 | Disposition: A | Discharge status: Home Health | Payer: Medicare HMO | Attending: Internal Medicine | Admitting: Internal Medicine

## 2015-10-22 DIAGNOSIS — J189 Pneumonia, unspecified organism: Secondary | ICD-10-CM

## 2015-10-22 DIAGNOSIS — E785 Hyperlipidemia, unspecified: Secondary | ICD-10-CM | POA: Diagnosis present

## 2015-10-22 DIAGNOSIS — J9600 Acute respiratory failure, unspecified whether with hypoxia or hypercapnia: Secondary | ICD-10-CM | POA: Diagnosis present

## 2015-10-22 DIAGNOSIS — R52 Pain, unspecified: Secondary | ICD-10-CM

## 2015-10-22 DIAGNOSIS — M79671 Pain in right foot: Secondary | ICD-10-CM | POA: Diagnosis not present

## 2015-10-22 DIAGNOSIS — R609 Edema, unspecified: Secondary | ICD-10-CM | POA: Diagnosis not present

## 2015-10-22 DIAGNOSIS — I739 Peripheral vascular disease, unspecified: Secondary | ICD-10-CM | POA: Diagnosis present

## 2015-10-22 DIAGNOSIS — I10 Essential (primary) hypertension: Secondary | ICD-10-CM | POA: Diagnosis present

## 2015-10-22 DIAGNOSIS — J449 Chronic obstructive pulmonary disease, unspecified: Secondary | ICD-10-CM | POA: Diagnosis present

## 2015-10-22 DIAGNOSIS — Z79899 Other long term (current) drug therapy: Secondary | ICD-10-CM | POA: Diagnosis not present

## 2015-10-22 DIAGNOSIS — Z7902 Long term (current) use of antithrombotics/antiplatelets: Secondary | ICD-10-CM | POA: Diagnosis not present

## 2015-10-22 DIAGNOSIS — I252 Old myocardial infarction: Secondary | ICD-10-CM

## 2015-10-22 DIAGNOSIS — R9431 Abnormal electrocardiogram [ECG] [EKG]: Secondary | ICD-10-CM | POA: Diagnosis present

## 2015-10-22 DIAGNOSIS — Z947 Corneal transplant status: Secondary | ICD-10-CM | POA: Diagnosis not present

## 2015-10-22 DIAGNOSIS — I2510 Atherosclerotic heart disease of native coronary artery without angina pectoris: Secondary | ICD-10-CM | POA: Diagnosis present

## 2015-10-22 DIAGNOSIS — Z951 Presence of aortocoronary bypass graft: Secondary | ICD-10-CM | POA: Diagnosis not present

## 2015-10-22 DIAGNOSIS — N390 Urinary tract infection, site not specified: Secondary | ICD-10-CM | POA: Diagnosis present

## 2015-10-22 DIAGNOSIS — J440 Chronic obstructive pulmonary disease with acute lower respiratory infection: Secondary | ICD-10-CM | POA: Diagnosis present

## 2015-10-22 DIAGNOSIS — K219 Gastro-esophageal reflux disease without esophagitis: Secondary | ICD-10-CM | POA: Diagnosis present

## 2015-10-22 DIAGNOSIS — D509 Iron deficiency anemia, unspecified: Secondary | ICD-10-CM | POA: Diagnosis present

## 2015-10-22 DIAGNOSIS — R21 Rash and other nonspecific skin eruption: Secondary | ICD-10-CM | POA: Diagnosis present

## 2015-10-22 DIAGNOSIS — J441 Chronic obstructive pulmonary disease with (acute) exacerbation: Principal | ICD-10-CM | POA: Diagnosis present

## 2015-10-22 DIAGNOSIS — M7989 Other specified soft tissue disorders: Secondary | ICD-10-CM | POA: Diagnosis present

## 2015-10-22 DIAGNOSIS — Z66 Do not resuscitate: Secondary | ICD-10-CM | POA: Diagnosis present

## 2015-10-22 DIAGNOSIS — I4581 Long QT syndrome: Secondary | ICD-10-CM | POA: Diagnosis present

## 2015-10-22 DIAGNOSIS — Z87891 Personal history of nicotine dependence: Secondary | ICD-10-CM | POA: Diagnosis not present

## 2015-10-22 DIAGNOSIS — Z8249 Family history of ischemic heart disease and other diseases of the circulatory system: Secondary | ICD-10-CM

## 2015-10-22 DIAGNOSIS — Z7982 Long term (current) use of aspirin: Secondary | ICD-10-CM

## 2015-10-22 HISTORY — DX: Pneumonia, unspecified organism: J18.9

## 2015-10-22 LAB — URINALYSIS, ROUTINE W REFLEX MICROSCOPIC
Bilirubin Urine: NEGATIVE
Glucose, UA: NEGATIVE mg/dL
Hgb urine dipstick: NEGATIVE
Ketones, ur: NEGATIVE mg/dL
NITRITE: NEGATIVE
PH: 5.5 (ref 5.0–8.0)
Protein, ur: NEGATIVE mg/dL
SPECIFIC GRAVITY, URINE: 1.018 (ref 1.005–1.030)

## 2015-10-22 LAB — CBC WITH DIFFERENTIAL/PLATELET
Basophils Absolute: 0 10*3/uL (ref 0.0–0.1)
Basophils Relative: 0 %
EOS PCT: 1 %
Eosinophils Absolute: 0.2 10*3/uL (ref 0.0–0.7)
HCT: 30.6 % — ABNORMAL LOW (ref 36.0–46.0)
HEMOGLOBIN: 10.3 g/dL — AB (ref 12.0–15.0)
LYMPHS ABS: 2.4 10*3/uL (ref 0.7–4.0)
LYMPHS PCT: 17 %
MCH: 28 pg (ref 26.0–34.0)
MCHC: 33.7 g/dL (ref 30.0–36.0)
MCV: 83.2 fL (ref 78.0–100.0)
MONOS PCT: 7 %
Monocytes Absolute: 1 10*3/uL (ref 0.1–1.0)
Neutro Abs: 10.1 10*3/uL — ABNORMAL HIGH (ref 1.7–7.7)
Neutrophils Relative %: 75 %
Platelets: 261 10*3/uL (ref 150–400)
RBC: 3.68 MIL/uL — AB (ref 3.87–5.11)
RDW: 14.5 % (ref 11.5–15.5)
WBC: 13.7 10*3/uL — AB (ref 4.0–10.5)

## 2015-10-22 LAB — I-STAT CHEM 8, ED
BUN: 20 mg/dL (ref 6–20)
CALCIUM ION: 1.13 mmol/L (ref 1.13–1.30)
CHLORIDE: 103 mmol/L (ref 101–111)
Creatinine, Ser: 1.2 mg/dL — ABNORMAL HIGH (ref 0.44–1.00)
GLUCOSE: 143 mg/dL — AB (ref 65–99)
HCT: 33 % — ABNORMAL LOW (ref 36.0–46.0)
Hemoglobin: 11.2 g/dL — ABNORMAL LOW (ref 12.0–15.0)
Potassium: 3.8 mmol/L (ref 3.5–5.1)
Sodium: 138 mmol/L (ref 135–145)
TCO2: 25 mmol/L (ref 0–100)

## 2015-10-22 LAB — URINE MICROSCOPIC-ADD ON

## 2015-10-22 LAB — MAGNESIUM: Magnesium: 2.1 mg/dL (ref 1.7–2.4)

## 2015-10-22 LAB — PHOSPHORUS: PHOSPHORUS: 2.7 mg/dL (ref 2.5–4.6)

## 2015-10-22 MED ORDER — ACETAMINOPHEN 325 MG PO TABS
650.0000 mg | ORAL_TABLET | Freq: Four times a day (QID) | ORAL | Status: DC | PRN
Start: 2015-10-22 — End: 2015-10-24

## 2015-10-22 MED ORDER — ALBUTEROL SULFATE (2.5 MG/3ML) 0.083% IN NEBU
5.0000 mg | INHALATION_SOLUTION | Freq: Once | RESPIRATORY_TRACT | Status: AC
  Administered 2015-10-22: 5 mg via RESPIRATORY_TRACT
  Filled 2015-10-22: qty 6

## 2015-10-22 MED ORDER — DEXTROSE 5 % IV SOLN
1.0000 g | INTRAVENOUS | Status: DC
  Administered 2015-10-22 – 2015-10-23 (×2): 1 g via INTRAVENOUS
  Filled 2015-10-22 (×4): qty 10

## 2015-10-22 MED ORDER — ENOXAPARIN SODIUM 40 MG/0.4ML ~~LOC~~ SOLN
40.0000 mg | SUBCUTANEOUS | Status: DC
  Administered 2015-10-22 – 2015-10-23 (×2): 40 mg via SUBCUTANEOUS
  Filled 2015-10-22 (×3): qty 0.4

## 2015-10-22 MED ORDER — DEXTROSE 5 % IV SOLN
500.0000 mg | INTRAVENOUS | Status: DC
  Filled 2015-10-22: qty 500

## 2015-10-22 MED ORDER — LEVOFLOXACIN IN D5W 750 MG/150ML IV SOLN
750.0000 mg | Freq: Once | INTRAVENOUS | Status: AC
  Administered 2015-10-22: 750 mg via INTRAVENOUS
  Filled 2015-10-22: qty 150

## 2015-10-22 MED ORDER — ROSUVASTATIN CALCIUM 10 MG PO TABS
10.0000 mg | ORAL_TABLET | Freq: Every day | ORAL | Status: DC
  Administered 2015-10-22 – 2015-10-23 (×2): 10 mg via ORAL
  Filled 2015-10-22 (×3): qty 1

## 2015-10-22 MED ORDER — AMLODIPINE BESYLATE 5 MG PO TABS
5.0000 mg | ORAL_TABLET | Freq: Every day | ORAL | Status: DC
  Administered 2015-10-23 – 2015-10-24 (×2): 5 mg via ORAL
  Filled 2015-10-22 (×2): qty 1

## 2015-10-22 MED ORDER — SODIUM CHLORIDE 0.9 % IV SOLN
INTRAVENOUS | Status: DC
  Administered 2015-10-22: 21:00:00 via INTRAVENOUS

## 2015-10-22 MED ORDER — GUAIFENESIN ER 600 MG PO TB12
600.0000 mg | ORAL_TABLET | Freq: Two times a day (BID) | ORAL | Status: DC
  Administered 2015-10-23 – 2015-10-24 (×4): 600 mg via ORAL
  Filled 2015-10-22 (×6): qty 1

## 2015-10-22 MED ORDER — ALBUTEROL SULFATE (2.5 MG/3ML) 0.083% IN NEBU
5.0000 mg | INHALATION_SOLUTION | RESPIRATORY_TRACT | Status: DC | PRN
  Administered 2015-10-22: 5 mg via RESPIRATORY_TRACT

## 2015-10-22 MED ORDER — CETIRIZINE HCL 5 MG/5ML PO SYRP
5.0000 mg | ORAL_SOLUTION | Freq: Every day | ORAL | Status: DC | PRN
  Filled 2015-10-22: qty 5

## 2015-10-22 MED ORDER — ASPIRIN EC 81 MG PO TBEC
81.0000 mg | DELAYED_RELEASE_TABLET | Freq: Every day | ORAL | Status: DC
  Administered 2015-10-23 – 2015-10-24 (×2): 81 mg via ORAL
  Filled 2015-10-22 (×2): qty 1

## 2015-10-22 MED ORDER — PREDNISOLONE ACETATE 1 % OP SUSP
2.0000 [drp] | OPHTHALMIC | Status: DC
  Administered 2015-10-22: 2 [drp] via OPHTHALMIC
  Filled 2015-10-22: qty 1

## 2015-10-22 MED ORDER — IPRATROPIUM BROMIDE 0.02 % IN SOLN
0.5000 mg | Freq: Once | RESPIRATORY_TRACT | Status: AC
  Administered 2015-10-22: 0.5 mg via RESPIRATORY_TRACT
  Filled 2015-10-22: qty 2.5

## 2015-10-22 MED ORDER — IPRATROPIUM-ALBUTEROL 0.5-2.5 (3) MG/3ML IN SOLN
3.0000 mL | Freq: Four times a day (QID) | RESPIRATORY_TRACT | Status: DC
  Administered 2015-10-23 – 2015-10-24 (×5): 3 mL via RESPIRATORY_TRACT
  Filled 2015-10-22 (×5): qty 3

## 2015-10-22 MED ORDER — MOMETASONE FURO-FORMOTEROL FUM 100-5 MCG/ACT IN AERO
2.0000 | INHALATION_SPRAY | Freq: Two times a day (BID) | RESPIRATORY_TRACT | Status: DC
  Administered 2015-10-23 (×2): 2 via RESPIRATORY_TRACT
  Filled 2015-10-22: qty 8.8

## 2015-10-22 MED ORDER — CLOPIDOGREL BISULFATE 75 MG PO TABS
75.0000 mg | ORAL_TABLET | Freq: Every day | ORAL | Status: DC
  Administered 2015-10-23 – 2015-10-24 (×2): 75 mg via ORAL
  Filled 2015-10-22 (×2): qty 1

## 2015-10-22 MED ORDER — PREDNISONE 20 MG PO TABS
40.0000 mg | ORAL_TABLET | Freq: Two times a day (BID) | ORAL | Status: DC
  Administered 2015-10-23 (×2): 40 mg via ORAL
  Filled 2015-10-22 (×4): qty 2

## 2015-10-22 MED ORDER — IPRATROPIUM BROMIDE 0.02 % IN SOLN
0.5000 mg | Freq: Four times a day (QID) | RESPIRATORY_TRACT | Status: DC
  Administered 2015-10-22: 0.5 mg via RESPIRATORY_TRACT
  Filled 2015-10-22: qty 2.5

## 2015-10-22 MED ORDER — PREDNISONE 20 MG PO TABS
60.0000 mg | ORAL_TABLET | Freq: Once | ORAL | Status: AC
  Administered 2015-10-22: 60 mg via ORAL
  Filled 2015-10-22: qty 3

## 2015-10-22 MED ORDER — ACETAMINOPHEN 325 MG PO TABS
650.0000 mg | ORAL_TABLET | Freq: Once | ORAL | Status: AC
  Administered 2015-10-22: 650 mg via ORAL
  Filled 2015-10-22: qty 2

## 2015-10-22 MED ORDER — DOXYCYCLINE HYCLATE 100 MG PO TABS
100.0000 mg | ORAL_TABLET | Freq: Two times a day (BID) | ORAL | Status: DC
  Administered 2015-10-22 – 2015-10-24 (×4): 100 mg via ORAL
  Filled 2015-10-22 (×5): qty 1

## 2015-10-22 MED ORDER — IPRATROPIUM-ALBUTEROL 0.5-2.5 (3) MG/3ML IN SOLN: 3.0000 mL | RESPIRATORY_TRACT | Status: DC | PRN

## 2015-10-22 MED ORDER — SODIUM CHLORIDE 0.9 % IV BOLUS (SEPSIS)
500.0000 mL | Freq: Once | INTRAVENOUS | Status: AC
Start: 2015-10-22 — End: 2015-10-22
  Administered 2015-10-22: 500 mL via INTRAVENOUS

## 2015-10-22 MED ORDER — AMLODIPINE-OLMESARTAN 5-40 MG PO TABS: 1.0000 | ORAL_TABLET | Freq: Every morning | ORAL | Status: DC

## 2015-10-22 MED ORDER — IRBESARTAN 300 MG PO TABS
300.0000 mg | ORAL_TABLET | Freq: Every day | ORAL | Status: DC
  Administered 2015-10-23 – 2015-10-24 (×2): 300 mg via ORAL
  Filled 2015-10-22 (×2): qty 1

## 2015-10-22 NOTE — ED Provider Notes (Signed)
CSN: [id number]     Arrival date & time 10/22/15  1323 History   First MD Initiated Contact with Patient 10/22/15 1530     Chief Complaint  Patient presents with  . Shortness of Breath  . Cough     (Consider location/radiation/quality/duration/timing/severity/associated sxs/prior Treatment) Patient is a 75 y.o. female presenting with shortness of breath and cough. The history is provided by the patient and a relative.  Shortness of Breath Severity:  Severe Onset quality:  Gradual Duration:  10 days Timing:  Constant Progression:  Worsening Chronicity:  New Context: URI   Relieved by:  Nothing Worsened by:  Activity and coughing Ineffective treatments:  Inhaler Associated symptoms: cough, fever, sputum production, vomiting and wheezing   Associated symptoms comment:  Soreness in the ribs and abdomen for all the coughing.  Pt has had pna and flu shot  Risk factors comment:  COPD, MI, CAD Cough Associated symptoms: fever, shortness of breath and wheezing     Past Medical History  Diagnosis Date  . Asthma   . Reflux   . Hiatal hernia   . Peripheral vascular disease ([place])   . Hypertension   . Hyperlipidemia   . Iliac artery aneurysm ([place])   . 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 Carotid artery occlusion   . Myocardial infarction [facility]) 01/01/2000    Cardiac catheterization  . COPD (chronic obstructive pulmonary disease) ([place])   . Coronary artery disease   . Arnold-Chiari malformation ([place]) 1998  . 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  . Gastroesophageal reflux disease   . Complication of anesthesia   . PONV (postoperative nausea and vomiting)     occassionally  . Diverticulitis    Past Surgical History  Procedure Laterality Date  . Eye surgery      Laser surgery for retinal hemorrhage  . Rotator cuff repair      Right  . Coronary artery bypass graft  01/01/2000  . Post coronary artery  bpg  01/05/2000   Right jugular sheath removed  . Carotid endarterectomy  03/29/2010    Left  CEA  . Pr vein bypass graft,aorto-fem-pop    . Arnold-chiari malformation repair  1998    Suboccipital craniectomy  . Corneal transplant      Right  . Appendectomy    . Abdominal hysterectomy    . Left heart catheterization with coronary angiogram N/A 08/03/2014    Procedure: LEFT HEART CATHETERIZATION WITH CORONARY ANGIOGRAM;  Surgeon: Birdie Riddle, MD;  Location: [facility] CATH LAB;  Service: Cardiovascular;  Laterality: N/A;  . Cholecystectomy      Gall Bladder  . Colonoscopy with propofol N/A 04/22/2015    Procedure: COLONOSCOPY WITH PROPOFOL;  Surgeon: Carol Ada, MD;  Location: [facility] ENDOSCOPY;  Service: Endoscopy;  Laterality: N/A;   Family History  Problem Relation Age of Onset  . Heart disease Mother     Heart Disease before age 12  . Hypertension Mother   . Hyperlipidemia Mother   . Heart attack Mother   . Clotting disorder Mother   . Heart disease Father     Heart Disease before age 76  . Heart attack Father   . Hyperlipidemia Father   . Hypertension Father   . Heart disease Brother     Heart Disease before age 32  . Hyperlipidemia Brother   . Hypertension Brother   . Clotting disorder Brother   . AAA (abdominal aortic aneurysm) Brother   .  Cerebral aneurysm Sister   . Hypertension Sister   . AAA (abdominal aortic aneurysm) Sister   . Asthma Sister   . Cerebral aneurysm Brother   . Cancer Brother     Lung  . Hypertension Brother   . Heart attack Brother   . Heart disease Brother     Aneurysm of Brain  . Hypertension Brother   . Heart disease Brother   . Heart disease Brother   . Stroke Son     Aneurysm of Stomach  . AAA (abdominal aortic aneurysm) Son    Social History  Substance Use Topics  . Smoking status: Former Smoker -- 1.00 packs/day for 30 years    Types: Cigarettes    Quit date: 08/13/2000  . Smokeless tobacco: Never Used  . Alcohol Use: No   OB History    No data  available     Review of Systems  Constitutional: Positive for fever.  Respiratory: Positive for cough, sputum production, shortness of breath and wheezing.   Gastrointestinal: Positive for vomiting.  Genitourinary:       Urine looked green yesterday but no dysuria, pelvic pain or other complaints  All other systems reviewed and are negative.     Allergies  Hydromorphone; Codeine; Oxycodone-acetaminophen; Risedronate; and Avelox  Home Medications   Prior to Admission medications   Medication Sig Start Date End Date Taking? Authorizing Provider  acetaminophen (TYLENOL) 500 MG tablet Take 1,000 mg by mouth every 6 (six) hours as needed for moderate pain or headache.   Yes Historical Provider, MD  albuterol (PROVENTIL) (2.5 MG/3ML) 0.083% nebulizer solution Take 2.5 mg by nebulization every 4 (four) hours as needed for wheezing or shortness of breath (((PLAN C))).    Yes Historical Provider, MD  albuterol (VENTOLIN HFA) 108 (90 BASE) MCG/ACT inhaler Inhale 2 puffs into the lungs every 4 (four) hours as needed for wheezing or shortness of breath (((PLAN B))). 08/09/13  Yes Pollie Friar, MD  aspirin EC 81 MG tablet Take 81 mg by mouth daily.    Yes Historical Provider, MD  AZOR 5-40 MG per tablet Take 1 tablet by mouth every morning. 10/08/14  Yes Historical Provider, MD  cetirizine (ZYRTEC) 1 MG/ML syrup Take 5 mg by mouth daily as needed (For allergies.).    Yes Historical Provider, MD  cloNIDine (CATAPRES) 0.1 MG tablet Take 0.05 mg by mouth 2 (two) times daily.    Yes Historical Provider, MD  clopidogrel (PLAVIX) 75 MG tablet Take 1 tablet (75 mg total) by mouth daily. 04/02/12  Yes Conrad Mulberry, MD  dexlansoprazole (DEXILANT) 60 MG capsule Take 1 capsule (60 mg total) by mouth daily before breakfast. 11/17/12  Yes Tanda Rockers, MD  nitroGLYCERIN (NITROSTAT) 0.4 MG SL tablet Place 0.4 mg under the tongue continuous as needed for chest pain.    Yes Historical Provider, MD  prednisoLONE  acetate (PRED FORTE) 1 % ophthalmic suspension Place 2 drops into the right eye every other day.  02/26/14  Yes Historical Provider, MD  promethazine (PHENERGAN) 12.5 MG tablet Take 1 tablet (12.5 mg total) by mouth every 6 (six) hours as needed for nausea or vomiting. 08/07/15  Yes Waynetta Pean, PA-C  rosuvastatin (CRESTOR) 10 MG tablet Take 10 mg by mouth at bedtime.    Yes Historical Provider, MD  Lactobacillus (ACIDOPHILUS PROBIOTIC) 10 MG TABS Take 10 mg by mouth 3 (three) times daily. 08/07/15   Waynetta Pean, PA-C   BP 137/108 mmHg  Pulse 69  Temp(Src) 98.3 F (36.8 C) (Oral)  Resp 18  SpO2 99% Physical Exam  Constitutional: She is oriented to person, place, and time. She appears well-developed and well-nourished. No distress.  HENT:  Head: Normocephalic and atraumatic.  Mouth/Throat: Oropharynx is clear and moist. Mucous membranes are dry.  Eyes: Conjunctivae and EOM are normal. Pupils are equal, round, and reactive to light.  Neck: Normal range of motion. Neck supple.  Cardiovascular: Normal rate, regular rhythm and intact distal pulses.   No murmur heard. Pulmonary/Chest: Effort normal. No respiratory distress. She has decreased breath sounds. She has wheezes. She has rhonchi in the right lower field. She has no rales.  Abdominal: Soft. She exhibits no distension. There is no tenderness. There is no rebound and no guarding.  Musculoskeletal: Normal range of motion. She exhibits no edema or tenderness.  Neurological: She is alert and oriented to person, place, and time.  Skin: Skin is warm and dry. No rash noted. No erythema.  Psychiatric: She has a normal mood and affect. Her behavior is normal.  Nursing note and vitals reviewed.   ED Course  Procedures (including critical care time) Labs Review Labs Reviewed  URINALYSIS, ROUTINE W REFLEX MICROSCOPIC (NOT AT [facility]) - Abnormal; Notable for the following:    APPearance HAZY (*)    Leukocytes, UA MODERATE (*)    All other  components within normal limits  URINE MICROSCOPIC-ADD ON - Abnormal; Notable for the following:    Squamous Epithelial / LPF 0-5 (*)    Bacteria, UA RARE (*)    All other components within normal limits  CBC WITH DIFFERENTIAL/PLATELET - Abnormal; Notable for the following:    WBC 13.7 (*)    RBC 3.68 (*)    Hemoglobin 10.3 (*)    HCT 30.6 (*)    Neutro Abs 10.1 (*)    All other components within normal limits  I-STAT CHEM 8, ED - Abnormal; Notable for the following:    Creatinine, Ser 1.20 (*)    Glucose, Bld 143 (*)    Hemoglobin 11.2 (*)    HCT 33.0 (*)    All other components within normal limits  URINE CULTURE  INFLUENZA PANEL BY PCR (TYPE A & B, H1N1)    Imaging Review Dg Chest 2 View  10/22/2015  CLINICAL DATA:  Productive cough and shortness of breath with fever for 5 days. EXAM: CHEST  2 VIEW COMPARISON:  08/07/2013.  CT chest 08/02/2014. FINDINGS: Patchy airspace disease is seen in the right base. Left lung is clear. Interstitial markings are diffusely coarsened with chronic features. The cardiopericardial silhouette is within normal limits for size. Patient is status post CABG The visualized bony structures of the thorax are intact. IMPRESSION: Right lower lung airspace disease, suspicious for pneumonia. Follow-up imaging is recommended to ensure resolution. Electronically Signed   By: Misty Stanley M.D.   On: 10/22/2015 14:19   I have personally reviewed and evaluated these images and lab results as part of my medical decision-making.   EKG Interpretation   Date/Time:  Saturday October 22 2015 14:43:44 EST Ventricular Rate:  71 PR Interval:  157 QRS Duration: 79 QT Interval:  513 QTC Calculation: 558 R Axis:   40 Text Interpretation:  Sinus rhythm Borderline T abnormalities, anterior  leads Prolonged QT interval No significant change since last tracing  Confirmed by Maryan Rued  MD, Loree Fee ([id number]) on 10/22/2015 4:32:43 PM      MDM   Final diagnoses:  CAP  (community acquired pneumonia)  Pt with hx of CAD, COPD/Asthma with URI sx with productive cough, wheezing and SOB that started about 10 days ago.  Pt for the last 3 days is now having fever and SOB is worsening without relief from inhaler.  Pt has normal VS on arrival but feels better with O2.  She has prior hx of PNA but last hospitalization was >91month ago. On exam pt has decreased breath sounds, wheezing and mild tachypnea.    CXR with RLL pneumonia.  UA without convincing UTI and pt has no c/o of dysuria just that her urine looked a strange color but no problems other wise.    CBC, Chem 8 pending.  Pt given albuterol/atrovent/prednisone, IVF and levaquin due to allergy to azithromycin.  4:50 PM See with a leukocytosis of 13,000, CMP relatively unchanged. Will admit for IV antibiotics.    WBlanchie Dessert MD 10/22/15 1710

## 2015-10-22 NOTE — ED Notes (Signed)
Pt also reports her urine is green, denies urinary symptoms however admits to some back pain.

## 2015-10-22 NOTE — H&P (Signed)
PCP:  [name], MD    Referring provider [place] Patient originally has been accepted by Dr. Dyann Kief  Chief Complaint:  cough and shortness of breath    HPI: [name] is a 75 y.o. 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   Presented with productive cough and worsening shortness of breath for the past 1 week today she had a fever up to 101.3. She's been having some urinary discoloration this been associated some vomiting and wheezing that patient endorses chest pain but it's mainly worse with coughing. She tried to use her inhaler but have not had improvement from wheezing. At her baseline she easely gets tired with minimal exertion.   IN ER: Afebrile WBC 13.7 chest x-ray showing small right lower lobe airspace disease suspicious for pneumonia UA was worrisome for possible evidence of infection. Patient was administered DuoNeb in emergency department And given normal saline bolus she was also given a dose of Levaquin but developed a rash and this was discontinued. His EKG also showed slightly prolonged QTC at 578 Regarding pertinent past history: She has known history of coronary artery disease followed for cardiology and myocardial ischemia in 2001 patient has known history of COPD  Hospitalist was called for admission for community-acquired pneumonia in combination of COPD exacerbation and UTI  Review of Systems:    Pertinent positives include: Fevers, chills, fatigue, shortness of breath at rest, dyspnea on exertion,   productive cough, change in color of urine, Constitutional:  No weight loss, night sweats, weight loss  HEENT:  No headaches, Difficulty swallowing,Tooth/dental problems,Sore throat,  No sneezing, itching, ear ache, nasal congestion, post nasal drip,  Cardio-vascular:  No chest pain, Orthopnea, PND, anasarca, dizziness, palpitations.no Bilateral lower extremity swelling  GI:  No heartburn, indigestion, abdominal pain, nausea, vomiting, diarrhea, change in bowel habits, loss of appetite, melena, blood in stool, hematemesis Resp:   No excess mucus, no No non-productive cough, No coughing up of blood.No change in color of mucus.No wheezing. Skin:  no rash or lesions. No jaundice GU:  no dysuria,  no urgency or frequency. No straining to urinate.  No flank pain.  Musculoskeletal:  No joint pain or no joint swelling. No decreased range of motion. No back pain.  Psych:  No change in mood or affect. No depression or anxiety. No memory loss.  Neuro: no localizing neurological complaints, no tingling, no weakness, no double vision, no gait abnormality, no slurred speech, no confusion  Otherwise ROS are negative except for above, 10 systems were reviewed  Past Medical History: Past Medical History  Diagnosis Date  . Asthma   . Reflux   . Hiatal hernia   . Peripheral vascular disease ([place])   . Hypertension   . Hyperlipidemia   . Iliac artery aneurysm ([place])   . 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 Carotid artery occlusion   . Myocardial infarction [facility]) 01/01/2000    Cardiac catheterization  . COPD (chronic obstructive pulmonary disease) ([place])   . Coronary artery disease   . Arnold-Chiari malformation ([place]) 1998  .  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  . Gastroesophageal reflux disease   . Complication of anesthesia   .  PONV (postoperative nausea and vomiting)     occassionally  . Diverticulitis    Past Surgical History  Procedure Laterality Date  . Eye surgery      Laser surgery for retinal hemorrhage  . Rotator cuff repair      Right  . Coronary artery bypass graft  01/01/2000  . Post coronary artery  bpg  01/05/2000    Right jugular sheath removed  . Carotid endarterectomy  03/29/2010    Left  CEA  . Pr vein bypass graft,aorto-fem-pop    . Arnold-chiari malformation repair  1998    Suboccipital craniectomy  . Corneal transplant      Right  . Appendectomy    . Abdominal hysterectomy    . Left heart catheterization with coronary angiogram N/A 08/03/2014    Procedure: LEFT HEART CATHETERIZATION WITH CORONARY ANGIOGRAM;  Surgeon: Birdie Riddle, MD;  Location: [facility];  Service: Cardiovascular;  Laterality: N/A;  . Cholecystectomy      Gall Bladder  . Colonoscopy with propofol N/A 04/22/2015    Procedure: COLONOSCOPY WITH PROPOFOL;  Surgeon: Carol Ada, MD;  Location: [facility];  Service: Endoscopy;  Laterality: N/A;     Medications: Prior to Admission medications   Medication Sig Start Date End Date Taking? Authorizing Provider  acetaminophen (TYLENOL) 500 MG tablet Take 1,000 mg by mouth every 6 (six) hours as needed for moderate pain or headache.   Yes Historical Provider, MD  albuterol (PROVENTIL) (2.5 MG/3ML) 0.083% nebulizer solution Take 2.5 mg by nebulization every 4 (four) hours as needed for wheezing or shortness of breath (((PLAN C))).    Yes Historical Provider, MD  albuterol (VENTOLIN HFA) 108 (90 BASE) MCG/ACT inhaler Inhale 2 puffs into the lungs every 4 (four) hours as needed for wheezing or shortness of breath (((PLAN B))). 08/09/13  Yes Pollie Friar, MD  aspirin EC 81 MG tablet Take 81 mg by mouth daily.    Yes Historical Provider, MD  AZOR 5-40 MG per tablet Take 1 tablet by mouth every morning. 10/08/14  Yes Historical Provider, MD  cetirizine (ZYRTEC) 1 MG/ML syrup Take  5 mg by mouth daily as needed (For allergies.).    Yes Historical Provider, MD  cloNIDine (CATAPRES) 0.1 MG tablet Take 0.05 mg by mouth 2 (two) times daily.    Yes Historical Provider, MD  clopidogrel (PLAVIX) 75 MG tablet Take 1 tablet (75 mg total) by mouth daily. 04/02/12  Yes Conrad Dalton, MD  dexlansoprazole (DEXILANT) 60 MG capsule Take 1 capsule (60 mg total) by mouth daily before breakfast. 11/17/12  Yes Tanda Rockers, MD  nitroGLYCERIN (NITROSTAT) 0.4 MG SL tablet Place 0.4 mg under the tongue continuous as needed for chest pain.    Yes Historical Provider, MD  prednisoLONE acetate (PRED FORTE) 1 % ophthalmic suspension Place 2 drops into the right eye every other day.  02/26/14  Yes Historical Provider, MD  promethazine (PHENERGAN) 12.5 MG tablet Take 1 tablet (12.5 mg total) by mouth every 6 (six) hours as needed for nausea or vomiting. 08/07/15  Yes Waynetta Pean, PA-C  rosuvastatin (CRESTOR) 10 MG tablet Take 10 mg by mouth at bedtime.    Yes Historical Provider, MD  Lactobacillus (ACIDOPHILUS PROBIOTIC) 10 MG TABS Take 10 mg by mouth 3 (three) times daily. 08/07/15   Waynetta Pean, PA-C    Allergies:   Allergies  Allergen Reactions  . Hydromorphone Palpitations and Other (See Comments)    DILAUDID  -  Pt had a Heart Attack after taking Dilaudid.  . Levaquin [Levofloxacin] Other (See Comments)    Chest pressure, SOB, "pain in between shoulder blades", sweaty -as reported by patient per experience in ED this afternoon  . Codeine Other (See Comments)    Dr. Terrence Dupont advised patient not to take this medication  . Oxycodone-Acetaminophen Other (See Comments)    Says it makes her feel weird  . Risedronate Other (See Comments)    Chest pain  . Avelox [Moxifloxacin Hcl In Nacl] Palpitations    Social History:  Ambulatory   independently  Has to hold on to the walls Lives at home  With family     reports that she quit smoking about 15 years ago. Her smoking use included  Cigarettes. She has a 30 pack-year smoking history. She has never used smokeless tobacco. She reports that she does not drink alcohol or use illicit drugs.     Family History: family history includes AAA (abdominal aortic aneurysm) in her brother, sister, and son; Asthma in her sister; Cancer in her brother; Cerebral aneurysm in her brother and sister; Clotting disorder in her brother and mother; Heart attack in her brother, father, and mother; Heart disease in her brother, brother, brother, brother, father, and mother; Hyperlipidemia in her brother, father, and mother; Hypertension in her brother, brother, brother, father, mother, and sister; Stroke in her son.    Physical Exam: Patient Vitals for the past 24 hrs:  BP Temp Temp src Pulse Resp SpO2  10/22/15 2139 102/70 mmHg 97.7 F (36.5 C) Oral 68 - 98 %  10/22/15 2052 - - - - - 98 %  10/22/15 1845 (!) 111/57 mmHg 98.1 F (36.7 C) Oral 65 20 98 %  10/22/15 1711 (!) 138/44 mmHg - - 84 22 100 %  10/22/15 1454 - - - - - 99 %  10/22/15 1453 - - - - - (!) 79 %  10/22/15 1329 (!) 137/108 mmHg 98.3 F (36.8 C) Oral 69 18 97 %    1. General:  in No Acute distress 2. Psychological: Alert and Oriented 3. Head/ENT:    Dry Mucous Membranes                          Head Non traumatic, neck supple                          Normal  Dentition 4. SKIN:  decreased Skin turgor,  Skin clean Dry and intact no rash 5. Heart: Regular rate and rhythm no Murmur, Rub or gallop 6. Lungs: mld occasional wheezes no crackles   7. Abdomen: Soft, non-tender, Non distended 8. Lower extremities: no clubbing, cyanosis, or edema 9. Neurologically Grossly intact, moving all 4 extremities equally 10. MSK: Normal range of motion  body mass index is unknown because there is no weight on file.   Labs on Admission:   Results for orders placed or performed during the hospital encounter of 10/22/15 (from the past 24 hour(s))  Urinalysis, Routine w reflex microscopic  (not at [facility])     Status: Abnormal   Collection Time: 10/22/15  2:24 PM  Result Value Ref Range   Color, Urine YELLOW YELLOW   APPearance HAZY (A) CLEAR   Specific Gravity, Urine 1.018 1.005 - 1.030   pH 5.5 5.0 - 8.0  Glucose, UA NEGATIVE NEGATIVE mg/dL   Hgb urine dipstick NEGATIVE NEGATIVE   Bilirubin Urine NEGATIVE NEGATIVE   Ketones, ur NEGATIVE NEGATIVE mg/dL   Protein, ur NEGATIVE NEGATIVE mg/dL   Nitrite NEGATIVE NEGATIVE   Leukocytes, UA MODERATE (A) NEGATIVE  Urine microscopic-add on     Status: Abnormal   Collection Time: 10/22/15  2:24 PM  Result Value Ref Range   Squamous Epithelial / LPF 0-5 (A) NONE SEEN   WBC, UA 6-30 0 - 5 WBC/hpf   RBC / HPF 0-5 0 - 5 RBC/hpf   Bacteria, UA RARE (A) NONE SEEN   Urine-Other MUCOUS PRESENT   CBC with Differential/Platelet     Status: Abnormal   Collection Time: 10/22/15  4:31 PM  Result Value Ref Range   WBC 13.7 (H) 4.0 - 10.5 K/uL   RBC 3.68 (L) 3.87 - 5.11 MIL/uL   Hemoglobin 10.3 (L) 12.0 - 15.0 g/dL   HCT 30.6 (L) 36.0 - 46.0 %   MCV 83.2 78.0 - 100.0 fL   MCH 28.0 26.0 - 34.0 pg   MCHC 33.7 30.0 - 36.0 g/dL   RDW 14.5 11.5 - 15.5 %   Platelets 261 150 - 400 K/uL   Neutrophils Relative % 75 %   Neutro Abs 10.1 (H) 1.7 - 7.7 K/uL   Lymphocytes Relative 17 %   Lymphs Abs 2.4 0.7 - 4.0 K/uL   Monocytes Relative 7 %   Monocytes Absolute 1.0 0.1 - 1.0 K/uL   Eosinophils Relative 1 %   Eosinophils Absolute 0.2 0.0 - 0.7 K/uL   Basophils Relative 0 %   Basophils Absolute 0.0 0.0 - 0.1 K/uL  I-stat chem 8, ed     Status: Abnormal   Collection Time: 10/22/15  4:39 PM  Result Value Ref Range   Sodium 138 135 - 145 mmol/L   Potassium 3.8 3.5 - 5.1 mmol/L   Chloride 103 101 - 111 mmol/L   BUN 20 6 - 20 mg/dL   Creatinine, Ser 1.20 (H) 0.44 - 1.00 mg/dL   Glucose, Bld 143 (H) 65 - 99 mg/dL   Calcium, Ion 1.13 1.13 - 1.30 mmol/L   TCO2 25 0 - 100 mmol/L   Hemoglobin 11.2 (L) 12.0 - 15.0 g/dL   HCT 33.0 (L) 36.0 - 46.0 %     UA   evidence of UTI  Lab Results  Component Value Date   HGBA1C * 12/02/2010    6.0 (NOTE)                                                                       According to the ADA Clinical Practice Recommendations for 2011, when HbA1c is used as a screening test:   >=6.5%   Diagnostic of Diabetes Mellitus           (if abnormal result  is confirmed)  5.7-6.4%   Increased risk of developing Diabetes Mellitus  References:Diagnosis and Classification of Diabetes Mellitus,Diabetes Care,2011,34(Suppl 1):S62-S69 and Standards of Medical Care in         Diabetes - 2011,Diabetes Care,2011,34  (Suppl 1):S11-S61.    CrCl cannot be calculated (Unknown ideal weight.).  BNP (last 3 results) No results for input(s): PROBNP in the last 8760  hours.  Other results:  I have pearsonaly reviewed this: ECG REPORT  Rate: 71  Rhythm: Normal sinus rhythm ST&T Change: No ischemic changes QTC 578  There were no vitals filed for this visit.   Cultures:    Component Value Date/Time   SDES STOOL 04/01/2013 1353   SPECREQUEST NONE 04/01/2013 1353   CULT  04/01/2013 1353    SALMONELLA SPECIES Note: SALMONELLA TYPHIMURIUM CRITICAL RESULT CALLED TO, READ BACK BY AND VERIFIED WITH: VALENCIA AT 555PM 04/05/13 VINCJ FAXED TO [facility] [id number] BY [facility] IN [place], [place] Performed at [place] 05/07/2013 FINAL 04/01/2013 1353     Radiological Exams on Admission: Dg Chest 2 View  10/22/2015  CLINICAL DATA:  Productive cough and shortness of breath with fever for 5 days. EXAM: CHEST  2 VIEW COMPARISON:  08/07/2013.  CT chest 08/02/2014. FINDINGS: Patchy airspace disease is seen in the right base. Left lung is clear. Interstitial markings are diffusely coarsened with chronic features. The cardiopericardial silhouette is within normal limits for size. Patient is status post CABG The visualized bony structures of the thorax are intact.  IMPRESSION: Right lower lung airspace disease, suspicious for pneumonia. Follow-up imaging is recommended to ensure resolution. Electronically Signed   By: Misty Stanley M.D.   On: 10/22/2015 14:19    Chart has been reviewed  Family at  Bedside  plan of care was discussed with Daughter,    Assessment/Plan  75 year old female history of COPD, coronary artery disease, presents for shortness of breath cough for past week was found to have small airspace disease and chest x-ray as well as possible evidence of UTI  Present on Admission:  . CAP (community acquired pneumonia) -  - will admit for treatment of CAP will start on appropriate antibiotic coverage.   Obtain sputum cultures, blood cultures if febrile or if decompensates.  Provide oxygen as needed. Would benefit from  Ambulatory evaluation Patient will likely benefit from repeat imaging given atypical finding and history of smoking. We'll have to follow-up imaging is not patient once she is recovered  . Essential hypertension stable continue home medications  . CAD (coronary artery disease) - stable chest pain appears to be in noncardiac likely secondary to cough continue to monitor continue home medications including Plavix  . COPD GOLD II suspect mild COPD exacerbation given wheezing  -  Will initiate Steroid taper, antibiotics, Albuterol PRN, scheduled Atrovent, Dulera and Mucinex. Titrate O2 to saturation >90%. Follow patients respiratory status.  . Prolonged QT interval - we'll avoid QT prolonging medications repeat EKG in the morning check electrolytes  . UTI (urinary tract infection) - will cover with Rocephin await results of urine culture Dyspnea most likely due to COPD but will eval echo to evaluate for cardia etiology as well  Prophylaxis:   Lovenox   CODE STATUS:  DNR/DNI  as per patient  With family aware   Disposition:   To home once workup is complete and patient is stable, would have PT oT eval prior to discharge  Other  plan as per orders.  I have spent a total of 54 min on this admission   Aneita Kiger 10/22/2015, 11:46 PM    [facility]  Pager [phone]   after 2 AM please page floor coverage PA If 7AM-7PM, please contact the day team taking care of the patient  [email or web address]  Password [email or web address]

## 2015-10-22 NOTE — ED Notes (Signed)
Pt presents w/ productive cough, shortness of breath and fever of 101.3 since Monday - pt has not taken any medications for her fever.

## 2015-10-23 ENCOUNTER — Encounter (HOSPITAL_COMMUNITY): Payer: Self-pay | Admitting: *Deleted

## 2015-10-23 ENCOUNTER — Inpatient Hospital Stay (HOSPITAL_COMMUNITY): Payer: Medicare HMO

## 2015-10-23 DIAGNOSIS — I10 Essential (primary) hypertension: Secondary | ICD-10-CM

## 2015-10-23 DIAGNOSIS — I2510 Atherosclerotic heart disease of native coronary artery without angina pectoris: Secondary | ICD-10-CM

## 2015-10-23 DIAGNOSIS — J441 Chronic obstructive pulmonary disease with (acute) exacerbation: Principal | ICD-10-CM

## 2015-10-23 LAB — COMPREHENSIVE METABOLIC PANEL
ALBUMIN: 3.2 g/dL — AB (ref 3.5–5.0)
ALT: 15 U/L (ref 14–54)
AST: 21 U/L (ref 15–41)
Alkaline Phosphatase: 67 U/L (ref 38–126)
Anion gap: 13 (ref 5–15)
BUN: 22 mg/dL — AB (ref 6–20)
CHLORIDE: 106 mmol/L (ref 101–111)
CO2: 21 mmol/L — AB (ref 22–32)
CREATININE: 1.27 mg/dL — AB (ref 0.44–1.00)
Calcium: 8.8 mg/dL — ABNORMAL LOW (ref 8.9–10.3)
GFR calc non Af Amer: 41 mL/min — ABNORMAL LOW (ref >60)
GFR, EST AFRICAN AMERICAN: 48 mL/min — AB (ref >60)
Glucose, Bld: 191 mg/dL — ABNORMAL HIGH (ref 65–99)
Potassium: 4 mmol/L (ref 3.5–5.1)
SODIUM: 140 mmol/L (ref 135–145)
Total Bilirubin: 0.3 mg/dL (ref 0.3–1.2)
Total Protein: 7 g/dL (ref 6.5–8.1)

## 2015-10-23 LAB — CBC WITH DIFFERENTIAL/PLATELET
Basophils Absolute: 0 10*3/uL (ref 0.0–0.1)
Basophils Relative: 0 %
EOS ABS: 0 10*3/uL (ref 0.0–0.7)
Eosinophils Relative: 0 %
HCT: 28.6 % — ABNORMAL LOW (ref 36.0–46.0)
HEMOGLOBIN: 9.5 g/dL — AB (ref 12.0–15.0)
LYMPHS ABS: 0.6 10*3/uL — AB (ref 0.7–4.0)
Lymphocytes Relative: 6 %
MCH: 27.5 pg (ref 26.0–34.0)
MCHC: 33.2 g/dL (ref 30.0–36.0)
MCV: 82.9 fL (ref 78.0–100.0)
MONOS PCT: 2 %
Monocytes Absolute: 0.2 10*3/uL (ref 0.1–1.0)
NEUTROS PCT: 92 %
Neutro Abs: 8.3 10*3/uL — ABNORMAL HIGH (ref 1.7–7.7)
Platelets: 235 10*3/uL (ref 150–400)
RBC: 3.45 MIL/uL — ABNORMAL LOW (ref 3.87–5.11)
RDW: 14.5 % (ref 11.5–15.5)
WBC: 9 10*3/uL (ref 4.0–10.5)

## 2015-10-23 LAB — INFLUENZA PANEL BY PCR (TYPE A & B)
H1N1 flu by pcr: NOT DETECTED
INFLAPCR: NEGATIVE
Influenza B By PCR: NEGATIVE

## 2015-10-23 MED ORDER — ARFORMOTEROL TARTRATE 15 MCG/2ML IN NEBU
15.0000 ug | INHALATION_SOLUTION | Freq: Two times a day (BID) | RESPIRATORY_TRACT | Status: DC
  Administered 2015-10-23 – 2015-10-24 (×2): 15 ug via RESPIRATORY_TRACT
  Filled 2015-10-23 (×5): qty 2

## 2015-10-23 MED ORDER — PREDNISONE 20 MG PO TABS
40.0000 mg | ORAL_TABLET | Freq: Every day | ORAL | Status: DC
  Administered 2015-10-24: 40 mg via ORAL
  Filled 2015-10-23 (×2): qty 2

## 2015-10-23 MED ORDER — CALCIUM CARBONATE ANTACID 500 MG PO CHEW
1.0000 | CHEWABLE_TABLET | Freq: Three times a day (TID) | ORAL | Status: DC | PRN
  Administered 2015-10-23 – 2015-10-24 (×3): 200 mg via ORAL
  Filled 2015-10-23 (×3): qty 1

## 2015-10-23 MED ORDER — BUDESONIDE 0.5 MG/2ML IN SUSP
0.5000 mg | Freq: Two times a day (BID) | RESPIRATORY_TRACT | Status: DC
  Administered 2015-10-23 – 2015-10-24 (×2): 0.5 mg via RESPIRATORY_TRACT
  Filled 2015-10-23 (×2): qty 2

## 2015-10-23 NOTE — Progress Notes (Signed)
[facility] PROGRESS NOTE  [name] YNW:[id number] DOB: [birth year] DOA: 10/22/2015 PCP: [name], MD  Assessment/Plan  Acute respiratory failure secondary to community acquired pneumonia, still SOB with minimal exertion -  Continue ceftriaxone and doxycycline   Acute COPD exacerbation, documented as Gold stage II as of 2014, lost to follow up -  Decrease to prednisone '40mg'$  daily -  Continue duonebs -  D/c dulera -  Start budesonide and brovana  -  Will need f/u with pulmonology  -  COPD gold  -  May need home oxygen  CAD s/p MI and HLD, stable chest pain appears to be in noncardiac likely secondary to cough continue to monitor continue home medications including Plavix   Hypertension, blood pressure stable, continue ARB  GERD, consider starting PPI  Microcytic anemia -  Iron studies, B12, folate -  TSH -  Occult stool -  Repeat hgb in AM   Diet:  Healthy heart Access:  PIV IVF:  off Proph:  lovenox  Code Status: DNR Family Communication: patient and two daughter Disposition Plan: pending improvemetn in dyspnea.  Likely home with oxygen   Consultants:  none  Procedures:  none  Antibiotics:  Ceftriaxone and doxycycline   HPI/Subjective:  Very SOB when she got up to ambulate, however, feeling much better at rest.  At baseline, can barely make it to the mailbox and back.  Has to lean against the counter to make a sandwich due to dyspnea at baseline.    Objective: Filed Vitals:   10/23/15 0532 10/23/15 0850 10/23/15 0922 10/23/15 1232  BP: 115/72     Pulse: 58  72 75  Temp: 97.4 F (36.3 C)     TempSrc: Oral     Resp: '18  18 20  '$ SpO2: 99% 98% 98% 95%    Intake/Output Summary (Last 24 hours) at 10/23/15 1547 Last data filed at 10/23/15 0532  Gross per 24 hour  Intake      0 ml  Output    500 ml  Net   -500 ml   There were no vitals filed for this visit. There is no weight on file to calculate BMI.  Exam:   General:  Adult  female, No acute distress at rest.  Frequent wheezy cough.  HEENT:  NCAT, MMM  Cardiovascular:  RRR, nl S1, S2 no mrg, 2+ pulses, warm extremities  Respiratory:  Diminished bilateral BS with wheeze, no rales or rhonchi, no increased WOB  Abdomen:   NABS, soft, NT/ND  MSK:   Normal tone and bulk, no LEE  Neuro:  Grossly intact  Data Reviewed: Basic Metabolic Panel:  Recent Labs Lab 10/22/15 1631 10/22/15 1639 10/23/15 0535  NA  --  138 140  K  --  3.8 4.0  CL  --  103 106  CO2  --   --  21*  GLUCOSE  --  143* 191*  BUN  --  20 22*  CREATININE  --  1.20* 1.27*  CALCIUM  --   --  8.8*  MG 2.1  --   --   PHOS 2.7  --   --    Liver Function Tests:  Recent Labs Lab 10/23/15 0535  AST 21  ALT 15  ALKPHOS 67  BILITOT 0.3  PROT 7.0  ALBUMIN 3.2*   No results for input(s): LIPASE, AMYLASE in the last 168 hours. No results for input(s): AMMONIA in the last 168 hours. CBC:  Recent Labs Lab 10/22/15 1631 10/22/15  1639 10/23/15 0535  WBC 13.7*  --  9.0  NEUTROABS 10.1*  --  8.3*  HGB 10.3* 11.2* 9.5*  HCT 30.6* 33.0* 28.6*  MCV 83.2  --  82.9  PLT 261  --  235    Recent Results (from the past 240 hour(s))  Urine culture     Status: None (Preliminary result)   Collection Time: 10/22/15  2:24 PM  Result Value Ref Range Status   Specimen Description URINE, CLEAN CATCH  Final   Special Requests NONE  Final   Culture   Final    TOO YOUNG TO READ Performed at [facility]    Report Status PENDING  Incomplete     Studies: Dg Chest 2 View  10/22/2015  CLINICAL DATA:  Productive cough and shortness of breath with fever for 5 days. EXAM: CHEST  2 VIEW COMPARISON:  08/07/2013.  CT chest 08/02/2014. FINDINGS: Patchy airspace disease is seen in the right base. Left lung is clear. Interstitial markings are diffusely coarsened with chronic features. The cardiopericardial silhouette is within normal limits for size. Patient is status post CABG The visualized bony  structures of the thorax are intact. IMPRESSION: Right lower lung airspace disease, suspicious for pneumonia. Follow-up imaging is recommended to ensure resolution. Electronically Signed   By: Misty Stanley M.D.   On: 10/22/2015 14:19    Scheduled Meds: . amLODipine  5 mg Oral Daily  . aspirin EC  81 mg Oral Daily  . cefTRIAXone (ROCEPHIN)  IV  1 g Intravenous Q24H  . clopidogrel  75 mg Oral Daily  . doxycycline  100 mg Oral Q12H  . enoxaparin (LOVENOX) injection  40 mg Subcutaneous Q24H  . guaiFENesin  600 mg Oral BID  . ipratropium-albuterol  3 mL Nebulization QID  . irbesartan  300 mg Oral Daily  . mometasone-formoterol  2 puff Inhalation BID  . prednisoLONE acetate  2 drop Right Eye QODAY  . [START ON 10/24/2015] predniSONE  40 mg Oral Q breakfast  . rosuvastatin  10 mg Oral QHS   Continuous Infusions:   Active Problems:   COPD GOLD II   CAD (coronary artery disease)   Essential hypertension   CAP (community acquired pneumonia)   Prolonged QT interval   UTI (urinary tract infection)   COPD exacerbation (HCC)    Time spent: 30 min    Tosca Pletz, [facility] Pager [phone]. If 7PM-7AM, please contact night-coverage at [email or web address], password [facility] 10/23/2015, 3:47 PM  LOS: 1 day

## 2015-10-23 NOTE — Progress Notes (Signed)
Dr. Sheran Fava texted earlier in shift that pt complained of cramps in rt foot. Pt felt it was "swollen". Only mild difference was observed as compared to lt foot. No new orders were received at that time.   Pt has soaked rt foot in warm foot baths intermittently today expressing that they helped. Family and pt c/o increase swelling in rt foot. +2 moderate dorsal pedis pulse noted with slightly more swelling noted than earlier today. Dr. Sheran Fava aware via phone. See new orders received.

## 2015-10-23 NOTE — Progress Notes (Signed)
Pt ambulated in hall 180 ft on room air with sats 98-100%. Pt short of breath with exertion becoming tachy '@102'$ . HR decrease and sats maintained at 100% on RA after couple of minutes of rest. Pt reassured and encouraged to take slow, deep breaths to remedy SOB post walk. 2 liters O2 applied.

## 2015-10-24 ENCOUNTER — Inpatient Hospital Stay (HOSPITAL_COMMUNITY): Payer: Medicare HMO

## 2015-10-24 ENCOUNTER — Telehealth: Payer: Self-pay | Admitting: Pulmonary Disease

## 2015-10-24 ENCOUNTER — Telehealth: Payer: Self-pay | Admitting: Internal Medicine

## 2015-10-24 DIAGNOSIS — J189 Pneumonia, unspecified organism: Secondary | ICD-10-CM

## 2015-10-24 DIAGNOSIS — R609 Edema, unspecified: Secondary | ICD-10-CM

## 2015-10-24 DIAGNOSIS — J449 Chronic obstructive pulmonary disease, unspecified: Secondary | ICD-10-CM

## 2015-10-24 DIAGNOSIS — M79671 Pain in right foot: Secondary | ICD-10-CM

## 2015-10-24 DIAGNOSIS — R52 Pain, unspecified: Secondary | ICD-10-CM

## 2015-10-24 LAB — URINE CULTURE

## 2015-10-24 MED ORDER — DOXYCYCLINE HYCLATE 100 MG PO TABS: 100.0000 mg | ORAL_TABLET | Freq: Two times a day (BID) | ORAL | Status: DC

## 2015-10-24 MED ORDER — PREDNISONE 10 MG PO TABS: ORAL_TABLET | ORAL | Status: DC

## 2015-10-24 MED ORDER — IPRATROPIUM-ALBUTEROL 0.5-2.5 (3) MG/3ML IN SOLN: 3.0000 mL | Freq: Four times a day (QID) | RESPIRATORY_TRACT | Status: DC

## 2015-10-24 MED ORDER — FUROSEMIDE 10 MG/ML IJ SOLN
40.0000 mg | Freq: Once | INTRAMUSCULAR | Status: DC
  Filled 2015-10-24: qty 4

## 2015-10-24 MED ORDER — MOMETASONE FURO-FORMOTEROL FUM 100-5 MCG/ACT IN AERO: 2.0000 | INHALATION_SPRAY | Freq: Two times a day (BID) | RESPIRATORY_TRACT | Status: DC

## 2015-10-24 NOTE — Telephone Encounter (Signed)
Patient scheduled to see Dr. Lenna Gilford Friday morning 10/28/15 at 9:30am. Sent message to Maryann Conners to get her to add 9:30 to Dr. Jeannine Kitten schedule. Will hold in my box until Dr. Jeannine Kitten schedule is open.

## 2015-10-24 NOTE — Evaluation (Signed)
Physical Therapy Evaluation Patient Details Name: [name] MRN: [id number] DOB: 1940/12/12 Today's Date: 10/24/2015   History of Present Illness  75 y.o. female with h/o asthma, PVD, HTN, MI, COPD, CAD, occipital neuralgia, Chiari malformation admitted with PNA, R foot swelling.   Clinical Impression  Pt admitted with above diagnosis. Pt currently with functional limitations due to the deficits listed below (see PT Problem List). Pt ambulated 400' with RW, SaO2 94-98% on RA, HR 86, 2/4 dyspnea.  Pt will benefit from skilled PT to increase their independence and safety with mobility to allow discharge to the venue listed below.       Follow Up Recommendations No PT follow up    Equipment Recommendations  Other (comment) (4 wheeled rolling walking, transfer tub bench for energy conservation)    Recommendations for Other Services OT consult     Precautions / Restrictions Precautions Precautions: Fall Precaution Comments: pt denies falls in past year Restrictions Weight Bearing Restrictions: No      Mobility  Bed Mobility Overal bed mobility: Independent                Transfers Overall transfer level: Needs assistance   Transfers: Sit to/from Stand Sit to Stand: Min guard         General transfer comment: min/guard for safety  Ambulation/Gait Ambulation/Gait assistance: Min guard Ambulation Distance (Feet): 400 Feet Assistive device: Rolling walker (2 wheeled) Gait Pattern/deviations: WFL(Within Functional Limits)   Gait velocity interpretation: at or above normal speed for age/gender General Gait Details: SaO2 94-98% on RA with walking, HR 80's walking, mild LOB x 1 -pt able to self correct, 2/4 dyspnea walking  Stairs            Wheelchair Mobility    Modified Rankin (Stroke Patients Only)       Balance Overall balance assessment: Needs assistance   Sitting balance-Leahy Scale: Normal       Standing balance-Leahy Scale: Good                               Pertinent Vitals/Pain Pain Assessment: No/denies pain    Home Living Family/patient expects to be discharged to:: Private residence Living Arrangements: Children Available Help at Discharge: Family;Available 24 hours/day Type of Home: House Home Access: Stairs to enter Entrance Stairs-Rails: Left Entrance Stairs-Number of Steps: 6 Home Layout: One level Home Equipment: None      Prior Function Level of Independence: Independent         Comments: limited by SOB, walks short distances, gets very SOB with stairs     Hand Dominance        Extremity/Trunk Assessment   Upper Extremity Assessment: Overall WFL for tasks assessed           Lower Extremity Assessment: Overall WFL for tasks assessed      Cervical / Trunk Assessment: Normal  Communication   Communication: No difficulties  Cognition Arousal/Alertness: Awake/alert Behavior During Therapy: WFL for tasks assessed/performed Overall Cognitive Status: Within Functional Limits for tasks assessed                      General Comments      Exercises        Assessment/Plan    PT Assessment    PT Diagnosis Difficulty walking;Generalized weakness   PT Problem List    PT Treatment Interventions     PT Goals (Current goals can  be found in the Care Plan section) Acute Rehab PT Goals Patient Stated Goal: housekeeping, cooking PT Goal Formulation: With patient/family Time For Goal Achievement: 11/07/15 Potential to Achieve Goals: Good    Frequency     Barriers to discharge        Co-evaluation               End of Session Equipment Utilized During Treatment: Gait belt Activity Tolerance: Patient tolerated treatment well Patient left: in chair;with call bell/phone within reach;with chair alarm set Nurse Communication: Mobility status         Time: 8469-6295 PT Time Calculation (min) (ACUTE ONLY): 31 min   Charges:   PT Evaluation $PT  Eval Low Complexity: 1 Procedure PT Treatments $Gait Training: 8-22 mins   PT G Codes:        Philomena Doheny 10/24/2015, 11:00 AM [phone]

## 2015-10-24 NOTE — Evaluation (Signed)
Occupational Therapy Evaluation Patient Details Name: [name] MRN: [id number] DOB: 21-Oct-1940 Today's Date: 10/24/2015    History of Present Illness 75 y.o. female with h/o asthma, PVD, HTN, MI, COPD, CAD, occipital neuralgia, Chiari malformation admitted with PNA, R foot swelling.    Clinical Impression   Pt admitted with PNA. Pt currently with functional limitations due to the deficits listed below (see OT Problem List).  Pt will benefit from skilled OT to increase their safety and independence with ADL and functional mobility for ADL to facilitate discharge to venue listed below.      Follow Up Recommendations  Home health OT    Equipment Recommendations  3 in 1 bedside comode;Tub/shower bench       Precautions / Restrictions Precautions Precautions: Fall Precaution Comments: pt denies falls in past year Restrictions Weight Bearing Restrictions: No      Mobility Bed Mobility Overal bed mobility: Independent                Transfers Overall transfer level: Needs assistance Equipment used: Rolling walker (2 wheeled) Transfers: Sit to/from [employer] Sit to Stand: Min guard         General transfer comment: min/guard for safety    Balance Overall balance assessment: Needs assistance   Sitting balance-Leahy Scale: Normal       Standing balance-Leahy Scale: Good                              ADL Overall ADL's : Needs assistance/impaired Eating/Feeding: Set up;Sitting   Grooming: Set up;Sitting   Upper Body Bathing: Set up;Sitting   Lower Body Bathing: Minimal assistance;Sit to/from stand   Upper Body Dressing : Set up;Sitting       Toilet Transfer: Minimal assistance;RW   Toileting- Clothing Manipulation and Hygiene: Min guard;Sit to/from stand;Cueing for safety         General ADL Comments: pt needs a tub bench - as well as a 3 n 1               Pertinent Vitals/Pain Pain Assessment: No/denies  pain     Hand Dominance     Extremity/Trunk Assessment Upper Extremity Assessment Upper Extremity Assessment: Generalized weakness   Lower Extremity Assessment Lower Extremity Assessment: Overall WFL for tasks assessed   Cervical / Trunk Assessment Cervical / Trunk Assessment: Normal   Communication Communication Communication: No difficulties   Cognition Arousal/Alertness: Awake/alert Behavior During Therapy: WFL for tasks assessed/performed Overall Cognitive Status: Within Functional Limits for tasks assessed                                Home Living Family/patient expects to be discharged to:: Private residence Living Arrangements: Children Available Help at Discharge: Family;Available 24 hours/day Type of Home: House Home Access: Stairs to enter [employer] of Steps: 6 Entrance Stairs-Rails: Left Home Layout: One level     Bathroom Shower/Tub: Tub/shower unit         Home Equipment: None          Prior Functioning/Environment Level of Independence: Independent        Comments: limited by SOB, walks short distances, gets very SOB with stairs    OT Diagnosis: Generalized weakness   OT Problem List: Decreased strength;Decreased activity tolerance   OT Treatment/Interventions: Self-care/ADL training;DME and/or AE instruction;Patient/family education    OT Goals(Current goals can  be found in the care plan section) Acute Rehab OT Goals Patient Stated Goal: housekeeping, cooking OT Goal Formulation: With patient Time For Goal Achievement: 11/07/15 Potential to Achieve Goals: Good ADL Goals Pt Will Perform Grooming: with supervision;standing Pt Will Perform Lower Body Dressing: with supervision;sit to/from stand Pt Will Transfer to Toilet: with supervision;regular height toilet;ambulating Pt Will Perform Toileting - Clothing Manipulation and hygiene: with supervision;sit to/from stand Pt Will Perform Tub/Shower Transfer: with min  guard assist;tub bench  OT Frequency: Min 2X/week              End of Session Equipment Utilized During Treatment: Rolling walker  Activity Tolerance: Patient tolerated treatment well;No increased pain Patient left: in bed;with call bell/phone within reach   Time: 1410-1435 OT Time Calculation (min): 25 min Charges:  OT General Charges $OT Visit: 1 Procedure OT Evaluation $OT Eval Low Complexity: 1 Procedure OT Treatments $Self Care/Home Management : 8-22 mins G-Codes:    Betsy Pries 10-31-2015, 2:39 PM

## 2015-10-24 NOTE — Progress Notes (Signed)
10/26/15 1625: Discharge instructions reviewed with patient; questions answered. TED hose applied to right leg, below the knee. Donne Hazel, RN

## 2015-10-24 NOTE — Telephone Encounter (Signed)
Fine with me - 02 rx is done by discharging service, not thru [place]

## 2015-10-24 NOTE — Discharge Summary (Signed)
Physician Discharge Summary  [name] CWC:[id number] DOB: 1940-12-03 DOA: 10/22/2015  PCP: [name], MD  Admit date: 10/22/2015 Discharge date: 10/24/2015  Recommendations for Outpatient Follow-up:  1.  Follow up with Pulmonology in 1 week or sooner if needed for COPD exacerbation and pneumonia 2.  Follow up with PCP in 2-3 days if right foot pain does not improve with steroids and tylenol 3.  PCP in one week or less to repeat CBC, ongoing work up for anemia if not already complete  Discharge Diagnoses:  Principal Problem:   CAP (community acquired pneumonia) Active Problems:   COPD GOLD II   CAD (coronary artery disease)   Essential hypertension   Prolonged QT interval   UTI (urinary tract infection)   COPD exacerbation (HCC)   Foot pain, right   Discharge Condition: stable, improved  Diet recommendation: healthy heart  Wt Readings from Last 3 Encounters:  10/23/15 78.427 kg (172 lb 14.4 oz)  08/07/15 76.204 kg (168 lb)  07/20/15 78.382 kg (172 lb 12.8 oz)    History of present illness:   [name] is a 75 y.o. 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 She presented with several complaints, primarily increased cough and shortness of breath with fever to 101.44F, not improved with albuterol.  She also had some changes to the color of her urine.  In the ER, she was afebrile WBC 13.7 chest x-ray showing small right lower lobe  airspace disease suspicious for pneumonia.  UA was worrisome for possible evidence of infection. Patient was administered DuoNeb in emergency department, given normal saline bolus and started on Levaquin but she developed a rash and her levaquin was discontinued.  EKG also showed slightly prolonged QTC at [facility] Course:   Acute hypoxic respiratory failure secondary to CAP, started on ceftriaxone and doxycycline and had fast clinical improvement in her symptoms. She remained afebrile and her leukocytosis resolved within 1 day.  She should continue doxycycline for 1 week.  Continue probiotic to reduce C. Diff risk.  She was able to ambulate without supplemental oxygen maintaining oxygen saturations in the mid-90s or higher.    Acute COPD exacerbation, GOLD II as of 2014.  Started steroid taper.  Antibiotics as above.  Had improvements with duonebs as above.  She was previously followed by Dr. Melvyn Novas, but she would like to transition to another provider.  PT/OT recommended some equipment for home and ongoing home OT which was ordered.  Follow up with pulmonology in 1-2 weeks.    Right foot pain.  She developed acute onset right foot pain reportedly without any sort of fall, strain, or trip which feels like a cramping under her foot.  She has a distant history of surgery to repair tendons under her foot after she stepped on some glass many years ago.  She is at risk for tendon rupture secondary to her exposure to fluoroquinolone and was advised to bear weight as tolerated and if her pain does not get better with tylenol and steroids, she should follow up with her PCP for reevaluation.  XR and duplex of the right lower extremity demonstrated  only arthritis.  She was given TED hose to help reduce swelling.  I suspect early plantar fasciitis.    Essential hypertension, stable.    CAD, stable, continue aspirin and plavix.    Prolonged QT interval, avoid QTc prolonging medications.  Although suspected  at admission, I doubt UTI because she only had rare bacteria and 6-30 WBC on a clean catch sample with + squamous epithelials.  Her urine culture grew mixed flora.  She will be treated with doxycycline as above for pneumonia which will likely cover any UTI also.    Microcytic anemia, appears to occur intermittently, no obvious bleeding during hospitalization and likely trending down because of hemodilution.  Last colonoscopy was 04/2015.  Repeat CBC in 1 week or sooner by PCP and further work up for anemia if not already complete.   Consultants:  none  Procedures:  XR right foot  Duplex RLE  Antibiotics:  Ceftriaxone and doxycycline  Discharge Exam: Filed Vitals:   10/24/15 1106 10/24/15 1449  BP: 143/44 142/36  Pulse: 69 73  Temp: 97.9 F (36.6 C) 98.3 F (36.8 C)  Resp: 16 16   Filed Vitals:   10/24/15 1032 10/24/15 1051 10/24/15 1106 10/24/15 1449  BP:   143/44 142/36  Pulse:   69 73  Temp:   97.9 F (36.6 C) 98.3 F (36.8 C)  TempSrc:   Oral Oral  Resp:   16 16  Height:      Weight:      SpO2: 96% 97% 97% 97%     General: Adult female, No acute distress at rest.  HEENT: NCAT, MMM  Cardiovascular: RRR, nl S1, S2 no mrg, warm extremities  Respiratory:improved aeration with persistent wheeze, no rales or rhonchi, no increased WOB  Abdomen: NABS, soft, NT/ND  MSK: Normal tone and bulk, right foot with TTP on plantar surface which is mild.  No TTP along MTP joint or along metatarsals or with squeeze test.  Trace edema of the right foot. Tenderness along posterior calf as well, but no tenderness along Achilles or calcaneous.  No erythema or warmth.  2+ right pedal pulse, < 2 sec CR, able to wiggle toes and ankles, strength 5/5  Neuro: Grossly intact  Discharge Instructions      Discharge Instructions    Call MD for:  difficulty breathing, headache or visual disturbances    Complete by:  As directed      Call MD for:  extreme fatigue    Complete  by:  As directed      Call MD for:  hives    Complete by:  As directed      Call MD for:  persistant dizziness or light-headedness    Complete by:  As directed      Call MD for:  persistant nausea and vomiting    Complete by:  As directed      Call MD for:  severe uncontrolled pain    Complete by:  As directed      Call MD for:  temperature >100.4    Complete by:  As directed      Diet - low sodium heart healthy    Complete by:  As directed      Discharge instructions    Complete by:  As directed   Okay to put weight on your foot as tolerated until you see your primary care doctor.  Take prednisone in decreasing doses over the next 1-2 weeks and use your duonebs four times  a day.  I have also given you a new prescription for dulera to try.  Continue doxycycline twice a day until all the tabs are gone.     Increase activity slowly    Complete by:  As directed             Medication List    TAKE these medications        acetaminophen 500 MG tablet  Commonly known as:  TYLENOL  Take 1,000 mg by mouth every 6 (six) hours as needed for moderate pain or headache.     ACIDOPHILUS PROBIOTIC 10 MG Tabs  Take 10 mg by mouth 3 (three) times daily.     albuterol 108 (90 Base) MCG/ACT inhaler  Commonly known as:  VENTOLIN HFA  Inhale 2 puffs into the lungs every 4 (four) hours as needed for wheezing or shortness of breath (((PLAN B))).     albuterol (2.5 MG/3ML) 0.083% nebulizer solution  Commonly known as:  PROVENTIL  Take 2.5 mg by nebulization every 4 (four) hours as needed for wheezing or shortness of breath (((PLAN C))).     aspirin EC 81 MG tablet  Take 81 mg by mouth daily.     AZOR 5-40 MG tablet  Generic drug:  amLODipine-olmesartan  Take 1 tablet by mouth every morning.     cetirizine 1 MG/ML syrup  Commonly known as:  ZYRTEC  Take 5 mg by mouth daily as needed (For allergies.).     cloNIDine 0.1 MG tablet  Commonly known as:  CATAPRES  Take 0.05 mg by mouth 2  (two) times daily.     clopidogrel 75 MG tablet  Commonly known as:  PLAVIX  Take 1 tablet (75 mg total) by mouth daily.     dexlansoprazole 60 MG capsule  Commonly known as:  DEXILANT  Take 1 capsule (60 mg total) by mouth daily before breakfast.     doxycycline 100 MG tablet  Commonly known as:  VIBRA-TABS  Take 1 tablet (100 mg total) by mouth every 12 (twelve) hours.     ipratropium-albuterol 0.5-2.5 (3) MG/3ML Soln  Commonly known as:  DUONEB  Take 3 mLs by nebulization 4 (four) times daily.     mometasone-formoterol 100-5 MCG/ACT Aero  Commonly known as:  DULERA  Inhale 2 puffs into the lungs 2 (two) times daily.     nitroGLYCERIN 0.4 MG SL tablet  Commonly known as:  NITROSTAT  Place 0.4 mg under the tongue continuous as needed for chest pain.     prednisoLONE acetate 1 % ophthalmic suspension  Commonly known as:  PRED FORTE  Place 2 drops into the right eye every other day.     predniSONE 10 MG tablet  Commonly known as:  DELTASONE  Take four tabs daily for 3 days, 3 tabs daily for 3 days, 2 tabs daily for 3 days, 1 tab daily x 3 days, then stop     promethazine 12.5 MG tablet  Commonly known as:  PHENERGAN  Take 1 tablet (12.5 mg total) by mouth every 6 (six) hours as needed for nausea or vomiting.     rosuvastatin 10 MG tablet  Commonly known as:  CRESTOR  Take 10 mg by mouth at bedtime.       Follow-up Information    Follow up with BOUSKA,DAVID E, MD. Schedule an appointment as soon as possible for a visit in 2 days.   Specialty:  Family Medicine   Contact information:   [address]  [place] [address] [phone]       Follow up with Rexene Edison, NP. Schedule an appointment as soon as possible for a visit in 2 weeks.   Specialty:  Pulmonary Disease   Contact information:   [address] [place] [address] [phone]        The results of significant diagnostics from this hospitalization (including imaging,  microbiology, ancillary and laboratory) are listed below for reference.    Significant Diagnostic Studies: Dg Chest 2 View  10/22/2015  CLINICAL DATA:  Productive cough and shortness of breath with fever for 5 days. EXAM: CHEST  2 VIEW COMPARISON:  08/07/2013.  CT chest 08/02/2014. FINDINGS: Patchy airspace disease is seen in the right base. Left lung is clear. Interstitial markings are diffusely coarsened with chronic features. The cardiopericardial silhouette is within normal limits for size. Patient is status post CABG The visualized bony structures of the thorax are intact. IMPRESSION: Right lower lung airspace disease, suspicious for pneumonia. Follow-up imaging is recommended to ensure resolution. Electronically Signed   By: Misty Stanley M.D.   On: 10/22/2015 14:19   Dg Foot 2 Views Right  10/23/2015  CLINICAL DATA:  Sudden onset of right foot swelling and cramping today. No known injury. EXAM: RIGHT FOOT - 2 VIEW COMPARISON:  Radiographs 10/16/2007. FINDINGS: The bones are demineralized. There is no evidence of acute fracture or dislocation. There are mild degenerative changes at the first metatarsal phalangeal joint. There is an ossific density along the plantar aspect of the calcaneus, probably related to remote plantar fasciitis. There is a small posterior calcaneal spur. IMPRESSION: No acute osseous findings. Osteopenia with mild first MTP degenerative changes. Electronically Signed   By: Richardean Sale M.D.   On: 10/23/2015 19:30    Microbiology: Recent Results (from the past 240 hour(s))  Urine culture     Status: None   Collection Time: 10/22/15  2:24 PM  Result Value Ref Range Status   Specimen Description URINE, CLEAN CATCH  Final   Special Requests NONE  Final   Culture   Final    MULTIPLE SPECIES PRESENT, SUGGEST RECOLLECTION Performed at [facility]    Report Status 10/24/2015 FINAL  Final     Labs: Basic Metabolic Panel:  Recent Labs Lab 10/22/15 1631  10/22/15 1639 10/23/15 0535  NA  --  138 140  K  --  3.8 4.0  CL  --  103 106  CO2  --   --  21*  GLUCOSE  --  143* 191*  BUN  --  20 22*  CREATININE  --  1.20* 1.27*  CALCIUM  --   --  8.8*  MG 2.1  --   --   PHOS 2.7  --   --    Liver Function Tests:  Recent Labs Lab 10/23/15 0535  AST 21  ALT 15  ALKPHOS 67  BILITOT 0.3  PROT 7.0  ALBUMIN 3.2*   No results for input(s): LIPASE, AMYLASE in the last 168 hours. No results for input(s): AMMONIA in the last 168 hours. CBC:  Recent Labs Lab 10/22/15 1631 10/22/15 1639 10/23/15 0535  WBC 13.7*  --  9.0  NEUTROABS 10.1*  --  8.3*  HGB 10.3* 11.2* 9.5*  HCT 30.6* 33.0* 28.6*  MCV 83.2  --  82.9  PLT 261  --  235   Cardiac Enzymes: No results for input(s): CKTOTAL, CKMB, CKMBINDEX, TROPONINI in the last 168 hours. BNP:  BNP (last 3 results) No results for input(s): BNP in the last 8760 hours.  ProBNP (last 3 results) No results for input(s): PROBNP in the last 8760 hours.  CBG: No results for input(s): GLUCAP in the last 168 hours.  Time coordinating discharge: 35 minutes  Signed:  Esaul Dorwart  [facility] 10/24/2015, 3:36 PM

## 2015-10-24 NOTE — Telephone Encounter (Signed)
SN please advise if you're ok with taking on this patient.  Thanks!

## 2015-10-24 NOTE — Progress Notes (Signed)
*  Preliminary Results* Right lower extremity venous duplex completed. Right lower extremity is negative for deep vein thrombosis. There is no evidence of right Baker's cyst.  Of note: the patient expresses concern about her right foot swelling and would like this addressed prior to discharge.  10/24/2015 1:18 PM  Maudry Mayhew, RVT, RDCS, RDMS

## 2015-10-24 NOTE — Telephone Encounter (Signed)
Spoke with patient daughter, states that they are wanting her switched from Dr Melvyn Novas to Dr Lenna Gilford. Pt is currently in the hospital, states that she will need testing when d/c home for O2. Daughter states that there has been mention of discharge home on O2. Aware that we will go through the proper protocol regarding switching patients MD and will call back.  Please advise Dr Melvyn Novas if okay with request to switch to Dr Lenna Gilford. Thanks.

## 2015-10-24 NOTE — Telephone Encounter (Signed)
Per SN:  Okay to switch.  Okay to set patient up for appt on Friday 10/28/15 (add-on)

## 2015-10-24 NOTE — Telephone Encounter (Signed)
Spoke with daughter Sharlet Salina- states that the patient is currently still in the hospital and she will call back once the patient is d/c home.  Nothing further needed.

## 2015-10-25 NOTE — Telephone Encounter (Signed)
Pt has been added to SN's schedule at 9:30am on 10/28/15.

## 2015-10-28 ENCOUNTER — Ambulatory Visit (INDEPENDENT_AMBULATORY_CARE_PROVIDER_SITE_OTHER)
Admission: RE | Admit: 2015-10-28 | Discharge: 2015-10-28 | Disposition: A | Discharge status: Home / Self Care | Payer: Medicare HMO | Source: Ambulatory Visit | Attending: Pulmonary Disease | Admitting: Pulmonary Disease

## 2015-10-28 ENCOUNTER — Ambulatory Visit (INDEPENDENT_AMBULATORY_CARE_PROVIDER_SITE_OTHER): Payer: Medicare HMO | Admitting: Pulmonary Disease

## 2015-10-28 ENCOUNTER — Encounter: Payer: Self-pay | Admitting: Pulmonary Disease

## 2015-10-28 VITALS — BP 140/62 | HR 58 | Temp 98.5°F | Ht 62.0 in | Wt 174.0 lb

## 2015-10-28 DIAGNOSIS — J189 Pneumonia, unspecified organism: Secondary | ICD-10-CM | POA: Diagnosis not present

## 2015-10-28 DIAGNOSIS — Z951 Presence of aortocoronary bypass graft: Secondary | ICD-10-CM | POA: Diagnosis not present

## 2015-10-28 DIAGNOSIS — E785 Hyperlipidemia, unspecified: Secondary | ICD-10-CM

## 2015-10-28 DIAGNOSIS — J449 Chronic obstructive pulmonary disease, unspecified: Secondary | ICD-10-CM | POA: Diagnosis not present

## 2015-10-28 DIAGNOSIS — I2510 Atherosclerotic heart disease of native coronary artery without angina pectoris: Secondary | ICD-10-CM | POA: Diagnosis not present

## 2015-10-28 DIAGNOSIS — I739 Peripheral vascular disease, unspecified: Secondary | ICD-10-CM

## 2015-10-28 DIAGNOSIS — I10 Essential (primary) hypertension: Secondary | ICD-10-CM

## 2015-10-28 DIAGNOSIS — I6523 Occlusion and stenosis of bilateral carotid arteries: Secondary | ICD-10-CM

## 2015-10-28 NOTE — Patient Instructions (Signed)
Greenley-- it was nice meeting you today...  Today we reviewed your medical history & the recent hospitalization for RLL pneumonia...  We checked a follow up CXR, Spirometry & an ambulatory oximetry test...    We will contact you w/ the results when available...     Please ask DrHarwani to send [place] a copy of your 2DEchocardiogram!  In the meanwhile>>    Finish the tapering course of oral PNEDNISONE as outlined...    Stay on the NEBULIZER w/ DUONEB at least 3 times daily...    Continue the DULERA- 2 sprays twice daily & rinse after the treatment...    You may use OTC MUCINEX '600mg'$  tabs-  Take 1-2 tabs twice daily for congestion & thick sputum  Call for any questions...  Let's plan a follow up visit in 6-8 weeks, sooner if needed for problems.Marland KitchenMarland Kitchen

## 2015-10-28 NOTE — Progress Notes (Signed)
Subjective:     Patient ID: [name], female   DOB: 1940-09-02, 75 y.o.   MRN: [id number]  HPI ~  October 28, 2015:  Initial pulmonary consult by SN>      61 y/o WF, daughter of Arline Asp, w/ hx COPD, HBP, CAD-s/p 3 vessel CABG, severe ASPVD, HL, HH/GERD, Divertics, etc...    She was prev evaluated by DrWert in 2014 & DrGonzalez before that>  She had mult medication intolerances and seemed to do better w/ NEBS; also had hx chr sinusitis & GERD...     Daughter called w/ request for a post-hosp f/u visit>  She was [facility] [address] 3/11 - 10/24/15 by [facility] w/ CAP, COPD exacerbation;  She presented w/ cough, small amt yellow sput, temp to 101, and incr SOB;  ER eval revealed T101.3, & exam w/ decr BS +wheezes and basilar rhonchi;  CXR showed patchy RLL airsp dis, Flu panel neg, unable to get sput for culture, Labs- Cr=1.27, BS~140-190, Hg~10-11 w/ MCV=83;  She was treated w/ O2, IVF, Duoneb, Levaquin & Pred;  Disch on Duoneb via nebulizer Qid, Dulera100-2spBid, Doxy100Bid, Pred taper...     Currently she feels back to baseline w/ sl cough, white sput & no discoloration or blood, +DOE w/ exertion and some ADLs, no CP, palpit, dizzy, edema;  On NEB w/ DUONEB Qid, DULERA100-2spBid, Pred'10mg'$  tapering sched from recent [place] as needed, Zyrtek prn...  Smoking Hx>  Ex-smoker- started age 71, smoked for ~40 yrs up to 1+ppd, and quit in 2002 when she had CABG; total = 45+pack-year smoking history...   Pulmonary Hx>  Hx "asthma", COPD, ex-smoker, hx recurrent bronchits, hx pneumonia 3/17  Medical Hx>  HBP, CAD- s/p 3 vessel CABG in 2002, severe ASPVD w/ Ao-Fem-Pop & left CAE 2011, 74m right MCA aneurysm, HH/GERD/ Divertics, colon polyps, HAs (s/p craniotomy for Arnold-chiari malformation)  Family Hx>  One sis w/ asthma, otherw all atherosclerosis related...  Occup Hx>  No known asbestos or toxic exposures  Current Meds>  ASA/Plavix, AZOR 5/40, Catapress0.1-1/2Bid, Crestor10, Dexilant60, (not on DM  meds- "I'm pre-diabetic")...  EXAM shows Afeb, VSS, O2sat=98% on RA; Wt=174#, 5'2"Tall, BMI=31;  HEENT- neg, mallampati1, bilat CBruits & L-CAE scar;  Chest- decrBS, clear w/o w/r/r;  Heart- CABG scar, RR Gr1-2/6 SEM w/o r/g;  Abd- +epig bruit, w/o masses;  Ext- w/o c/ce;  Neuro- w/o focal deficits...   PF reported from 2013>  FEV1=1.30 (58%), %1sec=65, no change post bronchdil, DLCO=74%  CT Angio Chest 08/02/14 showed NEG for PE, extensive CAD- s/p CABG, subsolid nodules in RML/ RLL pleural thickening & bibasilar atx, hepatic steatosis, no adenopathy...  CXR 10/22/15 showed norm heart size, s/p CABG, patchy airsp dis in right base, incr interstitial markings diffusely, biapical scarring...   CXR 10/28/15 shows resolved patchy RLL pneumonia, norm heart size, s/p CABG, atherosclerotic changes in Ao, etc...   Spirometry 10/28/15>  FVC=1.62 (63%), FEV1=1.19 (60%), %1sec=73, mid-flows reduced at 48% predicted;  This is c/w a mild obstructive ventilatory defect & GOLD Stage2 COPD, can't r/o superimposed restriction w/o LV measurement.  Ambulatory Oximetry 10/28/15> O2sat=100% on RA at rest w/ pulse=61;  Pt ambulated 3Laps in the office w/ lowest O2sat=99% w/ pulse=73/min...   LABS reviewed in EPIC>  She needs A1c, Anemia labs, TSH if not checked in the interval by DrBoushka...  IMP >>    COPD, GOLD Stage 2 disease    Ex-smoker-- quit 2002, 45+pack-year smoking hx    CAP 3/17-- NOS, resolved w/ Levaquin, Doxy,  but she claims allergic to everything...    Cardiac issues>  HBP, CAD- s/p 3 vessel CABG in 2002, severe ASPVD w/ left CAE 2011, 55m right MCA aneurysm-- followed by [facility]- DrDickson, IR-DrDeveshwar...    Medical issues>  HBP, HL, pre-diabetes, HH/GERD/ Divertics, colon polyps, HAs (s/p craniotomy for Arnold-Chiari malformation)-- followed by DrBoushka & DrWillis... PLAN >>     We discussed her COPD and recent hosp for CAP;  She is asked to use the NEBULIZER w/ DUONEB Tid  regularly, DULERA100-2spBid, and MUCINEX '600mg'$  1-2tabs bid w/ fluids... She will maintain regular follow up w/ her numerous physicians and plan pulm recheck in 6-8 wks...     Past Medical History  Diagnosis Date  . Asthma   . Reflux   . Hiatal hernia   . Peripheral vascular disease ([facility]   . Hypertension   . Hyperlipidemia   . Iliac artery aneurysm ([facility]   . 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[place]   . 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 Carotid artery occlusion   . Myocardial infarction ([facility] 01/01/2000    Cardiac catheterization  . COPD (chronic obstructive pulmonary disease) ([facility]   . Coronary artery disease   . Arnold-Chiari malformation ([place] 1998  . 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  . Gastroesophageal reflux disease   . Complication of anesthesia   . PONV (postoperative nausea and vomiting)     occassionally  . Diverticulitis     Past Surgical History  Procedure Laterality Date  . Eye surgery      Laser surgery for retinal hemorrhage  . Rotator cuff repair      Right  . Coronary artery bypass graft  01/01/2000  . Post coronary artery  bpg  01/05/2000    Right jugular sheath removed  . Carotid endarterectomy  03/29/2010    Left  CEA  . Pr vein bypass graft,aorto-fem-pop    . Arnold-chiari malformation repair  1998    Suboccipital craniectomy  . Corneal transplant      Right  . Appendectomy    . Abdominal hysterectomy    . Left heart catheterization with coronary angiogram N/A 08/03/2014    Procedure: LEFT HEART CATHETERIZATION WITH CORONARY ANGIOGRAM;  Surgeon: ABirdie Riddle MD;  [facility];  Service: Cardiovascular;  Laterality: N/A;  . Cholecystectomy      Gall Bladder  . Colonoscopy with propofol N/A 04/22/2015    Procedure: COLONOSCOPY WITH PROPOFOL;  Surgeon: PCarol Ada MD;  Location: [facility] ENDOSCOPY;  Service: Endoscopy;  Laterality: N/A;    Outpatient Encounter Prescriptions as of 10/28/2015  Medication Sig  . acetaminophen  (TYLENOL) 500 MG tablet Take 1,000 mg by mouth every 6 (six) hours as needed for moderate pain or headache.  . albuterol (PROVENTIL) (2.5 MG/3ML) 0.083% nebulizer solution Take 2.5 mg by nebulization every 4 (four) hours as needed for wheezing or shortness of breath (((PLAN C))).   . albuterol (VENTOLIN HFA) 108 (90 BASE) MCG/ACT inhaler Inhale 2 puffs into the lungs every 4 (four) hours as needed for wheezing or shortness of breath (((PLAN B))).  .Marland Kitchenaspirin EC 81 MG tablet Take 81 mg by mouth daily.   . AZOR 5-40 MG per tablet Take 1 tablet by mouth every morning.  . cetirizine (ZYRTEC) 1 MG/ML syrup Take 5 mg by mouth daily as needed (For allergies.).   .Marland KitchencloNIDine (CATAPRES) 0.1 MG tablet Take 0.05 mg by mouth 2 (two) times daily.   . clopidogrel (PLAVIX) 75 MG  tablet Take 1 tablet (75 mg total) by mouth daily.  Marland Kitchen dexlansoprazole (DEXILANT) 60 MG capsule Take 1 capsule (60 mg total) by mouth daily before breakfast.  . ipratropium-albuterol (DUONEB) 0.5-2.5 (3) MG/3ML SOLN Take 3 mLs by nebulization 4 (four) times daily.  . mometasone-formoterol (DULERA) 100-5 MCG/ACT AERO Inhale 2 puffs into the lungs 2 (two) times daily.  . nitroGLYCERIN (NITROSTAT) 0.4 MG SL tablet Place 0.4 mg under the tongue continuous as needed for chest pain.   . prednisoLONE acetate (PRED FORTE) 1 % ophthalmic suspension Place 2 drops into the right eye every other day.   . predniSONE (DELTASONE) 10 MG tablet Take four tabs daily for 3 days, 3 tabs daily for 3 days, 2 tabs daily for 3 days, 1 tab daily x 3 days, then stop  . rosuvastatin (CRESTOR) 10 MG tablet Take 10 mg by mouth at bedtime.   . [DISCONTINUED] promethazine (PHENERGAN) 12.5 MG tablet Take 1 tablet (12.5 mg total) by mouth every 6 (six) hours as needed for nausea or vomiting.  . [DISCONTINUED] doxycycline (VIBRA-TABS) 100 MG tablet Take 1 tablet (100 mg total) by mouth every 12 (twelve) hours.  . [DISCONTINUED] Lactobacillus (ACIDOPHILUS PROBIOTIC) 10 MG  TABS Take 10 mg by mouth 3 (three) times daily.   No facility-administered encounter medications on file as of 10/28/2015.    Allergies  Allergen Reactions  . Hydromorphone Palpitations and Other (See Comments)    DILAUDID  -  Pt had a Heart Attack after taking Dilaudid.  . Levaquin [Levofloxacin] Other (See Comments)    Chest pressure, SOB, "pain in between shoulder blades", sweaty -as reported by patient per experience in ED this afternoon  . Codeine Other (See Comments)    Dr. Terrence Dupont advised patient not to take this medication  . Doxycycline     Swelling   . Oxycodone-Acetaminophen Other (See Comments)    Says it makes her feel weird  . Risedronate Other (See Comments)    Chest pain  . Avelox [Moxifloxacin Hcl In Nacl] Palpitations    Immunization History  Administered Date(s) Administered  . Influenza Whole 05/13/2012  . Influenza,inj,Quad PF,36+ Mos 04/14/2015  . Pneumococcal Conjugate-13 04/14/2015  . Pneumococcal Polysaccharide-23 05/13/2012    Family History  Problem Relation Age of Onset  . Heart disease Mother     Heart Disease before age 68  . Hypertension Mother   . Hyperlipidemia Mother   . Heart attack Mother   . Clotting disorder Mother   . Heart disease Father     Heart Disease before age 73  . Heart attack Father   . Hyperlipidemia Father   . Hypertension Father   . Heart disease Brother     Heart Disease before age 66  . Hyperlipidemia Brother   . Hypertension Brother   . Clotting disorder Brother   . AAA (abdominal aortic aneurysm) Brother   . Cerebral aneurysm Sister   . Hypertension Sister   . AAA (abdominal aortic aneurysm) Sister   . Asthma Sister   . Cerebral aneurysm Brother   . Cancer Brother     Lung  . Hypertension Brother   . Heart attack Brother   . Heart disease Brother     Aneurysm of Brain  . Hypertension Brother   . Heart disease Brother   . Heart disease Brother   . Stroke Son     Aneurysm of Stomach  . AAA (abdominal  aortic aneurysm) Son     Social History  Social History  . Marital Status: Widowed    Spouse Name: N/A  . Number of Children: 4  . Years of Education: 7TH   Occupational History  . Not on file.   Social History Main Topics  . Smoking status: Former Smoker -- 1.00 packs/day for 30 years    Types: Cigarettes    Quit date: 08/13/2000  . Smokeless tobacco: Never Used  . Alcohol Use: No  . Drug Use: No  . Sexual Activity: Not on file   Other Topics Concern  . Not on file   Social History Narrative    Current Medications, Allergies, Past Medical History, Past Surgical History, Family History, and Social History were reviewed in [employer] record.   Review of Systems             All symptoms NEG except where BOLDED >>  Constitutional:  F/C/S, fatigue, anorexia, unexpected weight change. HEENT:  HA, visual changes, hearing loss, earache, nasal symptoms, sore throat, mouth sores, hoarseness. Resp:  cough, sputum, hemoptysis; SOB, tightness, wheezing. Cardio:  CP, palpit, DOE, orthopnea, edema. GI:  N/V/D/C, blood in stool; reflux, abd pain, distention, gas. GU:  dysuria, freq, urgency, hematuria, flank pain, voiding difficulty. MS:  joint pain, swelling, tenderness, decr ROM; neck pain, back pain, etc. Neuro:  HA, tremors, seizures, dizziness, syncope, weakness, numbness, gait abn. Skin:  suspicious lesions or skin rash. Heme:  adenopathy, bruising, bleeding. Psyche:  confusion, agitation, sleep disturbance, hallucinations, anxiety, depression suicidal.   Objective:   Physical Exam       Vital Signs:  Reviewed...  General:  WD, overweight, 75 y/o WF in NAD; alert & oriented; pleasant & cooperative... HEENT:  [place]/AT; Conjunctiva- pink, Sclera- nonicteric, EOM-wnl, PERRLA, Fundi-benign; EACs-clear, TMs-wnl; NOSE-clear; THROAT-clear & wnl. Neck:  Supple w/ decr ROM; no JVD; s/p L-CAE, bilat bruits; no thyromegaly or nodules palpated; no  lymphadenopathy. Chest:  decr BS, clear to P & A; without wheezes, rales, or rhonchi heard. Heart:  Regular Rhythm; norm S1 & S2, Gr1-2/6 SEM, w/o rubs or gallops detected. Abdomen:  Soft & nontender- no guarding or rebound; normal bowel sounds; no organomegaly or masses palpated. Ext:  decrROM; without deformities +arthritic changes; no varicose veins, +venous insuffic, no edema;  Pulses intact w/o bruits. Neuro:  No focal neuro deficits, +gait abn... Derm:  No lesions noted; no rash etc. Lymph:  No cervical, supraclavicular, axillary, or inguinal adenopathy palpated.   Assessment:      IMP >>    COPD, GOLD Stage 2 disease    Ex-smoker-- quit 2002, 45+pack-year smoking hx    CAP 3/17-- NOS, resolved w/ Levaquin, Doxy, but she claims allergic to everything...    Cardiac issues>  HBP, CAD- s/p 3 vessel CABG in 2002, severe ASPVD w/ left CAE 2011, 61m right MCA aneurysm-- followed by [facility]- DrDickson, IR-DrDeveshwar...    Medical issues>  HBP, HL, pre-diabetes, HH/GERD/ Divertics, colon polyps, HAs (s/p craniotomy for Arnold-Chiari malformation)-- followed by DrBoushka & DrWillis... PLAN >>     We discussed her COPD and recent hosp for CAP;  She is asked to use the NEBULIZER w/ DUONEB Tid regularly, DULERA100-2spBid, and MUCINEX '600mg'$  1-2tabs bid w/ fluids... She will maintain regular follow up w/ her numerous physicians and plan pulm recheck in 6-8 wks     Plan:     Patient's Medications  New Prescriptions   No medications on file  Previous Medications   ACETAMINOPHEN (TYLENOL) 500 MG TABLET  Take 1,000 mg by mouth every 6 (six) hours as needed for moderate pain or headache.   ALBUTEROL (VENTOLIN HFA) 108 (90 BASE) MCG/ACT INHALER    Inhale 2 puffs into the lungs every 4 (four) hours as needed for wheezing or shortness of breath (((PLAN B))).   ASPIRIN EC 81 MG TABLET    Take 81 mg by mouth daily.    AZOR 5-40 MG PER TABLET    Take 1 tablet by mouth every morning.    CETIRIZINE (ZYRTEC) 1 MG/ML SYRUP    Take 5 mg by mouth daily as needed (For allergies.).    CLONIDINE (CATAPRES) 0.1 MG TABLET    Take 0.05 mg by mouth 2 (two) times daily.    CLOPIDOGREL (PLAVIX) 75 MG TABLET    Take 1 tablet (75 mg total) by mouth daily.   DEXLANSOPRAZOLE (DEXILANT) 60 MG CAPSULE    Take 1 capsule (60 mg total) by mouth daily before breakfast.   IPRATROPIUM-ALBUTEROL (DUONEB) 0.5-2.5 (3) MG/3ML SOLN    Take 3 mLs by nebulization 4 (four) times daily.   MOMETASONE-FORMOTEROL (DULERA) 100-5 MCG/ACT AERO    Inhale 2 puffs into the lungs 2 (two) times daily.   NITROGLYCERIN (NITROSTAT) 0.4 MG SL TABLET    Place 0.4 mg under the tongue continuous as needed for chest pain.    PREDNISOLONE ACETATE (PRED FORTE) 1 % OPHTHALMIC SUSPENSION    Place 2 drops into the right eye every other day.    PREDNISONE (DELTASONE) 10 MG TABLET    Take four tabs daily for 3 days, 3 tabs daily for 3 days, 2 tabs daily for 3 days, 1 tab daily x 3 days, then stop   ROSUVASTATIN (CRESTOR) 10 MG TABLET    Take 10 mg by mouth at bedtime.   Modified Medications   No medications on file  Discontinued Medications   DOXYCYCLINE (VIBRA-TABS) 100 MG TABLET    Take 1 tablet (100 mg total) by mouth every 12 (twelve) hours.   LACTOBACILLUS (ACIDOPHILUS PROBIOTIC) 10 MG TABS    Take 10 mg by mouth 3 (three) times daily.   PROMETHAZINE (PHENERGAN) 12.5 MG TABLET    Take 1 tablet (12.5 mg total) by mouth every 6 (six) hours as needed for nausea or vomiting.

## 2015-11-03 NOTE — Progress Notes (Signed)
Quick Note:  Called and spoke to pt. Informed her of the results and recs per SN. Pt verbalized understanding and denied any further questions or concerns at this time. ______ 

## 2015-12-12 ENCOUNTER — Ambulatory Visit: Payer: Medicare HMO | Admitting: Neurology

## 2015-12-12 ENCOUNTER — Ambulatory Visit (INDEPENDENT_AMBULATORY_CARE_PROVIDER_SITE_OTHER): Payer: Medicare HMO | Admitting: Pulmonary Disease

## 2015-12-12 ENCOUNTER — Encounter: Payer: Self-pay | Admitting: Pulmonary Disease

## 2015-12-12 VITALS — BP 126/60 | HR 62 | Temp 97.9°F | Ht 62.0 in | Wt 172.4 lb

## 2015-12-12 DIAGNOSIS — I2510 Atherosclerotic heart disease of native coronary artery without angina pectoris: Secondary | ICD-10-CM

## 2015-12-12 DIAGNOSIS — Z951 Presence of aortocoronary bypass graft: Secondary | ICD-10-CM

## 2015-12-12 DIAGNOSIS — E785 Hyperlipidemia, unspecified: Secondary | ICD-10-CM

## 2015-12-12 DIAGNOSIS — J449 Chronic obstructive pulmonary disease, unspecified: Secondary | ICD-10-CM | POA: Diagnosis not present

## 2015-12-12 DIAGNOSIS — I6523 Occlusion and stenosis of bilateral carotid arteries: Secondary | ICD-10-CM

## 2015-12-12 DIAGNOSIS — I10 Essential (primary) hypertension: Secondary | ICD-10-CM

## 2015-12-12 NOTE — Patient Instructions (Signed)
Today we updated your med list in our EPIC system...    Continue your current medications the same...  We discussed increasing your exercise program to improve your stamina...  Call for any questions...  Let's plan a follow up visit in 53mo sooner if needed for problems..Marland KitchenMarland Kitchen

## 2015-12-13 ENCOUNTER — Encounter: Payer: Self-pay | Admitting: Pulmonary Disease

## 2015-12-13 NOTE — Progress Notes (Addendum)
Subjective:     Patient ID: [name], female   DOB: 03-03-1941, 75 y.o.   MRN: [id number]  HPI  ~  October 28, 2015:  Initial pulmonary consult by SN>  PCP is DrBouska    63 y/o WF, [name], w/ hx COPD, HBP, CAD-s/p 3 vessel CABG, severe ASPVD, HL, HH/GERD, Divertics, etc...    She was prev evaluated by DrWert in 2014 & DrGonzalez before that>  She had mult medication intolerances and seemed to do better w/ NEBS; also had hx chr sinusitis & GERD...     Daughter called w/ request for a post-hosp f/u visit>  She was [facility] 3/11 - 10/24/15 by [facility] w/ CAP, COPD exacerbation;  She presented w/ cough, small amt yellow sput, temp to 101, and incr SOB;  ER eval revealed T101.3, & exam w/ decr BS +wheezes and basilar rhonchi;  CXR showed patchy RLL airsp dis, Flu panel neg, unable to get sput for culture, Labs- Cr=1.27, BS~140-190, Hg~10-11 w/ MCV=83;  She was treated w/ O2, IVF, Duoneb, Levaquin & Pred;  Disch on Duoneb via nebulizer Qid, Dulera100-2spBid, Doxy100Bid, Pred taper...     Currently she feels back to baseline w/ sl cough, white sput & no discoloration or blood, +DOE w/ exertion and some ADLs, no CP, palpit, dizzy, edema;  On NEB w/ DUONEB Qid, DULERA100-2spBid, Pred'10mg'$  tapering sched from recent [place] as needed, Zyrtek prn...  Smoking Hx>  Ex-smoker- started age 36, smoked for ~40 yrs up to 1+ppd, and quit in 2002 when she had CABG; total = 45+pack-year smoking history...   Pulmonary Hx>  Hx "asthma", COPD, ex-smoker, hx recurrent bronchits, hx pneumonia 3/17  Medical Hx>  HBP, CAD- s/p 3 vessel CABG in 2002, severe ASPVD w/ Ao-Fem-Pop & left CAE 2011, 97m right MCA aneurysm, HH/GERD/ Divertics, colon polyps, HAs (s/p craniotomy for Arnold-chiari malformation)  Family Hx>  One sis w/ asthma, otherw all atherosclerosis related...  Occup Hx>  No known asbestos or toxic exposures  Current Meds>  ASA/Plavix, AZOR 5/40, Catapress0.1-1/2Bid, Crestor10, Dexilant60,  (not on DM meds- "I'm pre-diabetic")... EXAM shows Afeb, VSS, O2sat=98% on RA; Wt=174#, 5'2"Tall, BMI=31;  HEENT- neg, mallampati1, bilat CBruits & L-CAE scar;  Chest- decrBS, clear w/o w/r/r;  Heart- CABG scar, RR Gr1-2/6 SEM w/o r/g;  Abd- +epig bruit, w/o masses;  Ext- w/o c/ce;  Neuro- w/o focal deficits...   PF reported from 2013>  FEV1=1.30 (58%), %1sec=65, no change post bronchdil, DLCO=74%  CT Angio Chest 08/02/14 showed NEG for PE, extensive CAD- s/p CABG, subsolid nodules in RML/ RLL pleural thickening & bibasilar atx, hepatic steatosis, no adenopathy...  CXR 10/22/15 showed norm heart size, s/p CABG, patchy airsp dis in right base, incr interstitial markings diffusely, biapical scarring...   CXR 10/28/15 shows resolved patchy RLL pneumonia, norm heart size, s/p CABG, atherosclerotic changes in Ao, etc...   Spirometry 10/28/15>  FVC=1.62 (63%), FEV1=1.19 (60%), %1sec=73, mid-flows reduced at 48% predicted;  This is c/w a mild obstructive ventilatory defect & GOLD Stage2 COPD, can't r/o superimposed restriction w/o LV measurement.  Ambulatory Oximetry 10/28/15> O2sat=100% on RA at rest w/ pulse=61;  Pt ambulated 3Laps in the office w/ lowest O2sat=99% w/ pulse=73/min...   LABS reviewed in EPIC>  She needs A1c, Anemia labs, TSH if not checked in the interval by DrBoushka... IMP >>    COPD, GOLD Stage 2 disease    Ex-smoker-- quit 2002, 45+pack-year smoking hx    CAP 3/17-- NOS, resolved w/ Levaquin,  Doxy, but she claims allergic to everything...    Cardiac issues>  HBP, CAD- s/p 3 vessel CABG in 2002, severe ASPVD w/ left CAE 2011, 62m right MCA aneurysm-- followed by [facility]- DrDickson, IR-DrDeveshwar...    Medical issues>  HBP, HL, pre-diabetes, HH/GERD/ Divertics, colon polyps, HAs (s/p craniotomy for Arnold-Chiari malformation)-- followed by DrBoushka & DrWillis... PLAN >>     We discussed her COPD and recent hosp for CAP;  She is asked to use the NEBULIZER w/ DUONEB  Tid regularly, DULERA100-2spBid, and MUCINEX '600mg'$  1-2tabs bid w/ fluids... She will maintain regular follow up w/ her numerous physicians and plan pulm recheck in 6-8 wks...    ~  Dec 12, 2015:  6wk ROV w/ SN>  CMalkareturns on her NEBULIZER w/ Duoneb Qid, Dulera100-2spBid, & AlbutHFA rescue inhaler as needed; she has finished her prev Pred taper and notes that her breathing is improved & she feels back to baseline- sl cough, small amt whitish sput, min end-exp wheezing, and chr stable DOE, she denies CP/ hemoptysis/ chest tightness/ f/c/s, etc...     COPD, GOLD Stage 2 disease>  Back to baseline on her NEB w/ Duoneb Qid, Dulera100-2spBid, AlbutHFA rescue as needed; Rec to continue same + gradual exercise program & work on wt reduction...    Ex-smoker-- quit 2002, 45+pack-year smoking hx    CAP 3/17-- NOS, resolved w/ Levaquin, Doxy, but she claims allergic to everything...    Cardiac issues>  HBP, CAD- s/p 3 vessel CABG in 2002, severe ASPVD w/ left CAE 2011, 234mright MCA aneurysm-- [facility]- DrDickson, IR-DrDeveshwar...    Medical issues>  HBP, HL, borderline diabetes, HH/GERD/ Divertics, colon polyps, HAs (s/p craniotomy for Arnold-Chiari malformation), Anemia-- followed by DrBoushka & DrWillis... EXAM shows Afeb, VSS, O2sat=98% on RA; Wt=172#, 5'2"Tall, BMI=31;  HEENT- neg, mallampati1, bilat CBruits & L-CAE scar;  Chest- decrBS, clear w/o w/r/r;  Heart- CABG scar, RR Gr1-2/6 SEM w/o r/g;  Abd- +epig bruit, w/o masses;  Ext- w/o c/ce;  Neuro- w/o focal deficits...  IMP/PLAN>>  CaRilyas approaching her baseline, s/p pneumonia 10/2015;  Asked to continue NEBs Tid-Qid, Dulera Bid, & rescue inhaler as needed;  She will maintain general med follow up w/ DrBouska, Cards w/ DrHarwani, [facility]- DrDickson, and Neuro- DrWillis;  We plan ROV recheck 3 months...  ADDENDUM>>  CaNyleeent to the ER 02/24/16 c/o nausea, vomiting, & diarrhea assoc w/ some crampy abd pain; nothing acute was  found on exam & eval- given Zofran w/ improvement; she also reported SOB & CXR 02/24/16 showed norm heart size, s/pCABG, hyperexpanded lungs, vague nodular opac in left base & right perihilar areas, otherw clear w/o pneumonia or edema;  She followed up w/ DrBoushka & had a CT Chest done 02/28/16 showing scarring in the apicies, and a dominant spiculated 1149mosterior RLL nodule (prev CTA in 2015 showed it at 6mm53mseveral other tiny opacities noted, no adenopathy;  A subseq PET-CT 03/14/16 confirmed a hypermetabolic RLL nodule & no other hypermet lesions seen;  She had a needle Bx 04/11/16=> pos for squamous cell ca...     She was seen by DrBartle for TSurg but he felt that she was not an operative candidate based on her COPD & severe vascular pathology & suggested SBRT;  [facility] & she is pending the start of XRT at this time...   FullPFTs 04/10/16 showed FVC=1.53 (57%), FEV1=1.09 (54%), %1sec=71, mid-flows reduced at  42% predicted; post bronchodil FEV1 improved 17% to 1.28L;  TLC=3.77 (79%), RV=2.07 (96%), RV/TLC=55%;  DLCO=51% predicted and the DL/VA=86% predicted...    Past Medical History  Diagnosis Date  . Asthma   . Reflux   . Hiatal hernia   . Peripheral vascular disease ([place])   . Hypertension   . Hyperlipidemia   . Iliac artery aneurysm ([place])   . 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 Carotid artery occlusion   . Myocardial infarction [facility]) 01/01/2000    Cardiac catheterization  . COPD (chronic obstructive pulmonary disease) ([place])   . Coronary artery disease   . Arnold-Chiari malformation ([place]) 1998  . 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  . Gastroesophageal reflux disease   . Complication of anesthesia   . PONV (postoperative nausea and vomiting)     occassionally  . Diverticulitis     Past Surgical History  Procedure Laterality Date  . Eye surgery      Laser surgery for retinal  hemorrhage  . Rotator cuff repair      Right  . Coronary artery bypass graft  01/01/2000  . Post coronary artery  bpg  01/05/2000    Right jugular sheath removed  . Carotid endarterectomy  03/29/2010    Left  CEA  . Pr vein bypass graft,aorto-fem-pop    . Arnold-chiari malformation repair  1998    Suboccipital craniectomy  . Corneal transplant      Right  . Appendectomy    . Abdominal hysterectomy    . Left heart catheterization with coronary angiogram N/A 08/03/2014    Procedure: LEFT HEART CATHETERIZATION WITH CORONARY ANGIOGRAM;  Surgeon: Birdie Riddle, MD;  Location: [facility] CATH LAB;  Service: Cardiovascular;  Laterality: N/A;  . Cholecystectomy      Gall Bladder  . Colonoscopy with propofol N/A 04/22/2015    Procedure: COLONOSCOPY WITH PROPOFOL;  Surgeon: Carol Ada, MD;  Location: [facility] ENDOSCOPY;  Service: Endoscopy;  Laterality: N/A;    Outpatient Encounter Prescriptions as of 12/12/2015  Medication Sig  . acetaminophen (TYLENOL) 500 MG tablet Take 1,000 mg by mouth every 6 (six) hours as needed for moderate pain or headache.  . albuterol (PROVENTIL) (2.5 MG/3ML) 0.083% nebulizer solution Take 2.5 mg by nebulization every 4 (four) hours as needed for wheezing or shortness of breath (((PLAN C))).   . albuterol (VENTOLIN HFA) 108 (90 BASE) MCG/ACT inhaler Inhale 2 puffs into the lungs every 4 (four) hours as needed for wheezing or shortness of breath (((PLAN B))).  Marland Kitchen aspirin EC 81 MG tablet Take 81 mg by mouth daily.   . AZOR 5-40 MG per tablet Take 1 tablet by mouth every morning.  . cetirizine (ZYRTEC) 1 MG/ML syrup Take 5 mg by mouth daily as needed (For allergies.).   Marland Kitchen cloNIDine (CATAPRES) 0.1 MG tablet Take 0.05 mg by mouth 2 (two) times daily.   . clopidogrel (PLAVIX) 75 MG tablet Take 1 tablet (75 mg total) by mouth daily.  Marland Kitchen dexlansoprazole (DEXILANT) 60 MG capsule Take 1 capsule (60 mg total) by mouth daily before breakfast.  . ipratropium-albuterol (DUONEB) 0.5-2.5 (3) MG/3ML  SOLN Take 3 mLs by nebulization 4 (four) times daily.  . mometasone-formoterol (DULERA) 100-5 MCG/ACT AERO Inhale 2 puffs into the lungs 2 (two) times daily.  . nitroGLYCERIN (NITROSTAT) 0.4 MG SL tablet Place 0.4 mg under the tongue continuous as needed for chest pain.   . prednisoLONE acetate (PRED FORTE) 1 % ophthalmic  suspension Place 2 drops into the right eye every other day.   . rosuvastatin (CRESTOR) 10 MG tablet Take 10 mg by mouth at bedtime.   . [DISCONTINUED] predniSONE (DELTASONE) 10 MG tablet Take four tabs daily for 3 days, 3 tabs daily for 3 days, 2 tabs daily for 3 days, 1 tab daily x 3 days, then stop   No facility-administered encounter medications on file as of 12/12/2015.    Allergies  Allergen Reactions  . Hydromorphone Palpitations and Other (See Comments)    DILAUDID  -  Pt had a Heart Attack after taking Dilaudid.  . Levaquin [Levofloxacin] Other (See Comments)    Chest pressure, SOB, "pain in between shoulder blades", sweaty -as reported by patient per experience in ED this afternoon  . Codeine Other (See Comments)    Dr. Terrence Dupont advised patient not to take this medication  . Doxycycline     Swelling   . Oxycodone-Acetaminophen Other (See Comments)    Says it makes her feel weird  . Risedronate Other (See Comments)    Chest pain  . Avelox [Moxifloxacin Hcl In Nacl] Palpitations    Immunization History  Administered Date(s) Administered  . Influenza Whole 05/13/2012  . Influenza,inj,Quad PF,36+ Mos 04/14/2015  . Pneumococcal Conjugate-13 04/14/2015  . Pneumococcal Polysaccharide-23 05/13/2012    Family History  Problem Relation Age of Onset  . Heart disease Mother     Heart Disease before age 38  . Hypertension Mother   . Hyperlipidemia Mother   . Heart attack Mother   . Clotting disorder Mother   . Heart disease Father     Heart Disease before age 16  . Heart attack Father   . Hyperlipidemia Father   . Hypertension Father   . Heart disease  Brother     Heart Disease before age 64  . Hyperlipidemia Brother   . Hypertension Brother   . Clotting disorder Brother   . AAA (abdominal aortic aneurysm) Brother   . Cerebral aneurysm Sister   . Hypertension Sister   . AAA (abdominal aortic aneurysm) Sister   . Asthma Sister   . Cerebral aneurysm Brother   . Cancer Brother     Lung  . Hypertension Brother   . Heart attack Brother   . Heart disease Brother     Aneurysm of Brain  . Hypertension Brother   . Heart disease Brother   . Heart disease Brother   . Stroke Son     Aneurysm of Stomach  . AAA (abdominal aortic aneurysm) Son     Social History   Social History  . Marital Status: Widowed    Spouse Name: N/A  . Number of Children: 4  . Years of Education: 7TH   Occupational History  . Not on file.   Social History Main Topics  . Smoking status: Former Smoker -- 1.00 packs/day for 30 years    Types: Cigarettes    Quit date: 08/13/2000  . Smokeless tobacco: Never Used  . Alcohol Use: No  . Drug Use: No  . Sexual Activity: Not on file   Other Topics Concern  . Not on file   Social History Narrative    Current Medications, Allergies, Past Medical History, Past Surgical History, Family History, and Social History were reviewed in [employer] record.   Review of Systems             All symptoms NEG except where BOLDED >>  Constitutional:  F/C/S, fatigue, anorexia,  unexpected weight change. HEENT:  HA, visual changes, hearing loss, earache, nasal symptoms, sore throat, mouth sores, hoarseness. Resp:  cough, sputum, hemoptysis; SOB, tightness, wheezing. Cardio:  CP, palpit, DOE, orthopnea, edema. GI:  N/V/D/C, blood in stool; reflux, abd pain, distention, gas. GU:  dysuria, freq, urgency, hematuria, flank pain, voiding difficulty. MS:  joint pain, swelling, tenderness, decr ROM; neck pain, back pain, etc. Neuro:  HA, tremors, seizures, dizziness, syncope, weakness, numbness, gait  abn. Skin:  suspicious lesions or skin rash. Heme:  adenopathy, bruising, bleeding. Psyche:  confusion, agitation, sleep disturbance, hallucinations, anxiety, depression suicidal.   Objective:   Physical Exam       Vital Signs:  Reviewed...   General:  WD, overweight, 75 y/o WF in NAD; alert & oriented; pleasant & cooperative... HEENT:  [place]/AT; Conjunctiva- pink, Sclera- nonicteric, EOM-wnl, PERRLA, Fundi-benign; EACs-clear, TMs-wnl; NOSE-clear; THROAT-clear & wnl.  Neck:  Supple w/ decr ROM; no JVD; s/p L-CAE, bilat bruits; no thyromegaly or nodules palpated; no lymphadenopathy.  Chest:  decr BS, clear to P & A; without wheezes, rales, or rhonchi heard. Heart:  Regular Rhythm; norm S1 & S2, Gr1-2/6 SEM, w/o rubs or gallops detected. Abdomen:  Soft & nontender- no guarding or rebound; normal bowel sounds; no organomegaly or masses palpated. Ext:  decrROM; without deformities +arthritic changes; no varicose veins, +venous insuffic, no edema;  Pulses intact w/o bruits. Neuro:  No focal neuro deficits, +gait abn... Derm:  No lesions noted; no rash etc. Lymph:  No cervical, supraclavicular, axillary, or inguinal adenopathy palpated.   Assessment:      IMP >>    COPD, GOLD Stage 2 disease>  Back to baseline on her NEB w/ Duoneb Qid, Dulera100-2spBid, AlbutHFA rescue as needed; Rec to continue same + gradual exercise program & work on wt reduction...    Ex-smoker-- quit 2002, 45+pack-year smoking hx    CAP 3/17-- NOS, resolved w/ Levaquin, Doxy, but she claims allergic to everything...    Cardiac issues>  HBP, CAD- s/p 3 vessel CABG in 2002, severe ASPVD w/ left CAE 2011, 37m right MCA aneurysm-- followed by DElizabeth [facility]auer [facility]- DrDickson, IR-DrDeveshwar...    Medical issues>  HBP, HL, borderline diabetes, HH/GERD/ Divertics, colon polyps, HAs (s/p craniotomy for Arnold-Chiari malformation), Anemia-- followed by DrBoushka & DrWillis...  PLAN >>  10/28/15>   We discussed her COPD and  recent hosp for CAP;  She is asked to use the NEBULIZER w/ DUONEB Tid regularly, DULERA100-2spBid, and MUCINEX '600mg'$  1-2tabs bid w/ fluids... She will maintain regular follow up w/ her numerous physicians and plan pulm recheck in 6-8 wks. 12/12/15>   CZeffieis approaching her baseline, s/p pneumonia 10/2015;  Asked to continue NEBs Tid-Qid, Dulera Bid, & rescue inhaler as needed;  She will maintain general med follow up w/ DrBouska, Cards w/ DrHarwani, [facility]- DrDickson, and Neuro- DrWillis;  We plan ROV recheck 3 months     Plan:     Patient's Medications  New Prescriptions   No medications on file  Previous Medications   ACETAMINOPHEN (TYLENOL) 500 MG TABLET    Take 1,000 mg by mouth every 6 (six) hours as needed for moderate pain or headache.   ALBUTEROL (PROVENTIL) (2.5 MG/3ML) 0.083% NEBULIZER SOLUTION    Take 2.5 mg by nebulization every 4 (four) hours as needed for wheezing or shortness of breath (((PLAN C))).    ALBUTEROL (VENTOLIN HFA) 108 (90 BASE) MCG/ACT INHALER    Inhale 2 puffs into the lungs every 4 (four) hours as  needed for wheezing or shortness of breath (((PLAN B))).   ASPIRIN EC 81 MG TABLET    Take 81 mg by mouth daily.    AZOR 5-40 MG PER TABLET    Take 1 tablet by mouth every morning.   CETIRIZINE (ZYRTEC) 1 MG/ML SYRUP    Take 5 mg by mouth daily as needed (For allergies.).    CLONIDINE (CATAPRES) 0.1 MG TABLET    Take 0.05 mg by mouth 2 (two) times daily.    CLOPIDOGREL (PLAVIX) 75 MG TABLET    Take 1 tablet (75 mg total) by mouth daily.   DEXLANSOPRAZOLE (DEXILANT) 60 MG CAPSULE    Take 1 capsule (60 mg total) by mouth daily before breakfast.   IPRATROPIUM-ALBUTEROL (DUONEB) 0.5-2.5 (3) MG/3ML SOLN    Take 3 mLs by nebulization 4 (four) times daily.   MOMETASONE-FORMOTEROL (DULERA) 100-5 MCG/ACT AERO    Inhale 2 puffs into the lungs 2 (two) times daily.   NITROGLYCERIN (NITROSTAT) 0.4 MG SL TABLET    Place 0.4 mg under the tongue continuous as needed for chest pain.     PREDNISOLONE ACETATE (PRED FORTE) 1 % OPHTHALMIC SUSPENSION    Place 2 drops into the right eye every other day.    ROSUVASTATIN (CRESTOR) 10 MG TABLET    Take 10 mg by mouth at bedtime.   Modified Medications   No medications on file  Discontinued Medications   PREDNISONE (DELTASONE) 10 MG TABLET    Take four tabs daily for 3 days, 3 tabs daily for 3 days, 2 tabs daily for 3 days, 1 tab daily x 3 days, then stop

## 2015-12-14 ENCOUNTER — Encounter: Payer: Self-pay | Admitting: Neurology

## 2015-12-14 ENCOUNTER — Ambulatory Visit (INDEPENDENT_AMBULATORY_CARE_PROVIDER_SITE_OTHER): Payer: Medicare HMO | Admitting: Neurology

## 2015-12-14 VITALS — BP 138/60 | HR 76 | Ht 62.0 in | Wt 173.5 lb

## 2015-12-14 DIAGNOSIS — M5481 Occipital neuralgia: Secondary | ICD-10-CM

## 2015-12-14 NOTE — Progress Notes (Signed)
Reason for visit: Occipital neuralgia  [name] is an 75 y.o. female  History of present illness:  [name] is a 75 year old right-handed white female with a history of a suboccipital craniotomy for Arnold-Chiari malformation. After the surgery she has developed a sharp pain in the right occipital area that may occur off and on, the patient is having 1 or 2 episodes a month at this time, generally occurring on the right side the head. The patient is not taking medications for the pain, she has not wished to go on anything in terms of prescription medications. She indicates that when she yawns she can bring on the pain. She also reports some low back pain and some pain into the hips bilaterally that may come on with standing and walking. The patient is able to bring on the pain within only 30 or 45 feet of walking, and the pain goes away rapidly when she sits down. She has had similar pain in the past, she had MRI evaluation of the lumbar spine in 2012 for back pain and bilateral hip pain, no evidence of spinal stenosis was noted. She returns to the office today for an evaluation.  Past Medical History  Diagnosis Date  . Asthma   . Reflux   . Hiatal hernia   . Peripheral vascular disease ([place])   . Hypertension   . Hyperlipidemia   . Iliac artery aneurysm ([place])   . 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 Carotid artery occlusion   . Myocardial infarction [facility]) 01/01/2000    Cardiac catheterization  . COPD (chronic obstructive pulmonary disease) ([place])   . Coronary artery disease   . Arnold-Chiari malformation ([place]) 1998  . 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  . Gastroesophageal reflux disease   . Complication of anesthesia   . PONV (postoperative nausea and vomiting)     occassionally  . Diverticulitis     Past Surgical History  Procedure Laterality Date  . Eye surgery      Laser surgery for retinal hemorrhage  .  Rotator cuff repair      Right  . Coronary artery bypass graft  01/01/2000  . Post coronary artery  bpg  01/05/2000    Right jugular sheath removed  . Carotid endarterectomy  03/29/2010    Left  CEA  . Pr vein bypass graft,aorto-fem-pop    . Arnold-chiari malformation repair  1998    Suboccipital craniectomy  . Corneal transplant      Right  . Appendectomy    . Abdominal hysterectomy    . Left heart catheterization with coronary angiogram N/A 08/03/2014    Procedure: LEFT HEART CATHETERIZATION WITH CORONARY ANGIOGRAM;  Surgeon: Birdie Riddle, MD;  Location: [facility] CATH LAB;  Service: Cardiovascular;  Laterality: N/A;  . Cholecystectomy      Gall Bladder  . Colonoscopy with propofol N/A 04/22/2015    Procedure: COLONOSCOPY WITH PROPOFOL;  Surgeon: Carol Ada, MD;  Location: [facility] ENDOSCOPY;  Service: Endoscopy;  Laterality: N/A;    Family History  Problem Relation Age of Onset  . Heart disease Mother     Heart Disease before age 33  . Hypertension Mother   . Hyperlipidemia Mother   . Heart attack Mother   . Clotting disorder Mother   . Heart disease Father     Heart Disease before age 36  . Heart attack Father   . Hyperlipidemia Father   . Hypertension  Father   . Heart disease Brother     Heart Disease before age 73  . Hyperlipidemia Brother   . Hypertension Brother   . Clotting disorder Brother   . AAA (abdominal aortic aneurysm) Brother   . Cerebral aneurysm Sister   . Hypertension Sister   . AAA (abdominal aortic aneurysm) Sister   . Asthma Sister   . Cerebral aneurysm Brother   . Cancer Brother     Lung  . Hypertension Brother   . Heart attack Brother   . Heart disease Brother     Aneurysm of Brain  . Hypertension Brother   . Heart disease Brother   . Heart disease Brother   . Stroke Son     Aneurysm of Stomach  . AAA (abdominal aortic aneurysm) Son     Social history:  reports that she quit smoking about 15 years ago. Her smoking use included Cigarettes. She has  a 30 pack-year smoking history. She has never used smokeless tobacco. She reports that she does not drink alcohol or use illicit drugs.    Allergies  Allergen Reactions  . Hydromorphone Palpitations and Other (See Comments)    DILAUDID  -  Pt had a Heart Attack after taking Dilaudid.  . Levaquin [Levofloxacin] Other (See Comments)    Chest pressure, SOB, "pain in between shoulder blades", sweaty -as reported by patient per experience in ED this afternoon  . Codeine Other (See Comments)    Dr. Terrence Dupont advised patient not to take this medication  . Doxycycline     Swelling   . Oxycodone-Acetaminophen Other (See Comments)    Says it makes her feel weird  . Risedronate Other (See Comments)    Chest pain  . Avelox [Moxifloxacin Hcl In Nacl] Palpitations    Medications:  Prior to Admission medications   Medication Sig Start Date End Date Taking? Authorizing Provider  acetaminophen (TYLENOL) 500 MG tablet Take 1,000 mg by mouth every 6 (six) hours as needed for moderate pain or headache.   Yes Historical Provider, MD  albuterol (PROVENTIL) (2.5 MG/3ML) 0.083% nebulizer solution Take 2.5 mg by nebulization every 4 (four) hours as needed for wheezing or shortness of breath (((PLAN C))).    Yes Historical Provider, MD  albuterol (VENTOLIN HFA) 108 (90 BASE) MCG/ACT inhaler Inhale 2 puffs into the lungs every 4 (four) hours as needed for wheezing or shortness of breath (((PLAN B))). 08/09/13  Yes Pollie Friar, MD  aspirin EC 81 MG tablet Take 81 mg by mouth daily.    Yes Historical Provider, MD  AZOR 5-40 MG per tablet Take 1 tablet by mouth every morning. 10/08/14  Yes Historical Provider, MD  cetirizine (ZYRTEC) 1 MG/ML syrup Take 5 mg by mouth daily as needed (For allergies.).    Yes Historical Provider, MD  cloNIDine (CATAPRES) 0.1 MG tablet Take 0.05 mg by mouth 2 (two) times daily.    Yes Historical Provider, MD  clopidogrel (PLAVIX) 75 MG tablet Take 1 tablet (75 mg total) by mouth daily.  04/02/12  Yes Conrad [place], MD  dexlansoprazole (DEXILANT) 60 MG capsule Take 1 capsule (60 mg total) by mouth daily before breakfast. 11/17/12  Yes Tanda Rockers, MD  ipratropium-albuterol (DUONEB) 0.5-2.5 (3) MG/3ML SOLN Take 3 mLs by nebulization 4 (four) times daily. 10/24/15  Yes Janece Canterbury, MD  mometasone-formoterol (DULERA) 100-5 MCG/ACT AERO Inhale 2 puffs into the lungs 2 (two) times daily. 10/24/15  Yes Janece Canterbury, MD  nitroGLYCERIN (NITROSTAT) 0.4  MG SL tablet Place 0.4 mg under the tongue continuous as needed for chest pain.    Yes Historical Provider, MD  prednisoLONE acetate (PRED FORTE) 1 % ophthalmic suspension Place 2 drops into the right eye every other day.  02/26/14  Yes Historical Provider, MD  rosuvastatin (CRESTOR) 10 MG tablet Take 10 mg by mouth at bedtime.    Yes Historical Provider, MD    ROS:  Out of a complete 14 system review of symptoms, the patient complains only of the following symptoms, and all other reviewed systems are negative.  Decreased activity, chills, fatigue, weight gain Ringing in the ears, difficulty swallowing Eye itching, double vision, loss of vision, blurred vision Wheezing, shortness of breath Excessive thirst Insomnia, frequent waking Incontinence of the bladder, frequency of urination, urinary urgency Skin wounds, moles Bruising easily Dizziness, headache Agitation, depression, anxiety  Blood pressure 138/60, pulse 76, height '5\' 2"'$  (1.575 m), weight 173 lb 8 oz (78.699 kg).  Physical Exam  General: The patient is alert and cooperative at the time of the examination. The patient is moderately obese.  Neuromuscular: The patient lacks about 30 of lateral rotation of the cervical spine to the left, 20 to the right. The patient is able to flex the low back to about 100.  Skin: No significant peripheral edema is noted.   Neurologic Exam  Mental status: The patient is alert and oriented x 3 at the time of the examination. The  patient has apparent normal recent and remote memory, with an apparently normal attention span and concentration ability.   Cranial nerves: Facial symmetry is present. Speech is normal, no aphasia or dysarthria is noted. Extraocular movements are full. Visual fields are full.  Motor: The patient has good strength in all 4 extremities.  Sensory examination: Soft touch sensation is symmetric on the face, arms, and legs.  Coordination: The patient has good finger-nose-finger and heel-to-shin bilaterally.  Gait and station: The patient has a normal gait. Tandem gait is slightly unsteady. Romberg is negative. No drift is seen.  Reflexes: Deep tendon reflexes are symmetric.   MRI brain and MRA head 02/09/15:  IMPRESSION: MRI of the brain demonstrates no acute stroke, parenchymal hemorrhage, or hydrocephalus.  Acute subarachnoid hemorrhage is not well visualized on MR. Noncontrast CT of the head is recommended for further evaluation in the setting of severe headache with known aneurysm.  Unremarkable MRA intracranial.  Unchanged and unremarkable MRA intracranial with no enlargement of the previously identified small outpouchings.  * MRI scan images were reviewed online. I agree with the written report.    Assessment/Plan:  1. Right occipital neuralgia   2. Chronic low back pain   The patient does not wish to go on any medications at this time. I have recommended right occipital nerve injections if the pain becomes more frequent and more severe. The pain when she gets it last about 5 minutes. The patient has some low back pain, MRI lumbar evaluation in the past for similar pain did not show evidence of spinal stenosis. I have recommended an epidural steroid injection, the patient will call me when she wishes to get this procedure done. Otherwise, the patient will follow-up through this office if needed.   Jill Alexanders MD 12/14/2015 8:25 PM  [facility] [address] [place], [place] [address]  Phone [phone] Fax [phone]

## 2015-12-14 NOTE — Patient Instructions (Signed)
Occipital Neuralgia Occipital neuralgia is a type of headache that causes episodes of very bad pain in the back of your head. Pain from occipital neuralgia may spread (radiate) to other parts of your head. The pain is usually brief and often goes away after you rest and relax. These headaches may be caused by irritation of the nerves that leave your spinal cord high up in your neck, just below the base of your skull (occipital nerves). Your occipital nerves transmit sensations from the back of your head, the top of your head, and the areas behind your ears. CAUSES Occipital neuralgia can occur without any known cause (primary headache syndrome). In other cases, occipital neuralgia is caused by pressure on or irritation of one of the two occipital nerves. Causes of occipital nerve compression or irritation include:  Wear and tear of the vertebrae in the neck (osteoarthritis).  Neck injury.  Disease of the disks that separate the vertebrae.  Tumors.  Gout.  Infections.  Diabetes.  Swollen blood vessels that put pressure on the occipital nerves.  Muscle spasm in the neck. SIGNS AND SYMPTOMS Pain is the main symptom of occipital neuralgia. It usually starts in the back of the head but may also be felt in other areas supplied by the occipital nerves. Pain is usually on one side but may be on both sides. You may have:   Brief episodes of very bad pain that is burning, stabbing, shocking, or shooting.  Pain behind the eye.  Pain triggered by neck movement or hair brushing.  Scalp tenderness.  Aching in the back of the head between episodes of very bad pain. DIAGNOSIS  Your health care provider may diagnose occipital neuralgia based on your symptoms and a physical exam. During the exam, the health care provider may push on areas supplied by the occipital nerves to see if they are painful. Some tests may also be done to help in making the diagnosis. These may include:  Imaging studies of  the upper spinal cord, such as an MRI or CT scan. These may show compression or spinal cord abnormalities.  Nerve block. You will get an injection of numbing medicine (local anesthetic) near the occipital nerve to see if this relieves pain. TREATMENT  Treatment may begin with simple measures, such as:   Rest.  Massage.  Heat.  Over-the-counter pain relievers. If these measures do not work, you may need other treatments, including:  Medicines such as:  Prescription-strength anti-inflammatory medicines.  Muscle relaxants.  Antiseizure medicines.  Antidepressants.  Steroid injection. This involves injections of local anesthetic and strong anti-inflammatory drugs (steroids).  Pulsed radiofrequency. Wires are implanted to deliver electrical impulses that block pain signals from the occipital nerve.  Physical therapy.  Surgery to relieve nerve pressure. HOME CARE INSTRUCTIONS  Take all medicines as directed by your health care provider.  Avoid activities that cause pain.  Rest when you have an attack of pain.  Try gentle massage or a heating pad to relieve pain.  Work with a physical therapist to learn stretching exercises you can do at home.  Try a different pillow or sleeping position.  Practice good posture.  Try to stay active. Get regular exercise that does not cause pain. Ask your health care provider to suggest safe exercises for you.  Keep all follow-up visits as directed by your health care provider. This is important. SEEK MEDICAL CARE IF:  Your medicine is not working.  You have new or worsening symptoms. SEEK IMMEDIATE MEDICAL CARE   IF:  You have very bad head pain that is not going away.  You have a sudden change in vision, balance, or speech. MAKE SURE YOU:  Understand these instructions.  Will watch your condition.  Will get help right away if you are not doing well or get worse.   This information is not intended to replace advice given to  you by your health care provider. Make sure you discuss any questions you have with your health care provider.   Document Released: 07/24/2001 Document Revised: 08/20/2014 Document Reviewed: 07/22/2013 Elsevier Interactive Patient Education 2016 [employer]  

## 2016-01-26 ENCOUNTER — Other Ambulatory Visit: Payer: Self-pay | Admitting: Family Medicine

## 2016-01-26 DIAGNOSIS — Z1231 Encounter for screening mammogram for malignant neoplasm of breast: Secondary | ICD-10-CM

## 2016-02-01 ENCOUNTER — Other Ambulatory Visit: Payer: Self-pay | Admitting: Cardiology

## 2016-02-01 DIAGNOSIS — R079 Chest pain, unspecified: Secondary | ICD-10-CM

## 2016-02-02 ENCOUNTER — Other Ambulatory Visit (HOSPITAL_COMMUNITY): Payer: Self-pay | Admitting: Interventional Radiology

## 2016-02-02 DIAGNOSIS — I729 Aneurysm of unspecified site: Secondary | ICD-10-CM

## 2016-02-06 ENCOUNTER — Ambulatory Visit: Payer: Medicare HMO

## 2016-02-10 ENCOUNTER — Encounter (HOSPITAL_COMMUNITY): Admission: RE | Admit: 2016-02-10 | Payer: Medicare HMO | Source: Ambulatory Visit

## 2016-02-10 ENCOUNTER — Encounter (HOSPITAL_COMMUNITY)
Admission: RE | Admit: 2016-02-10 | Discharge: 2016-02-10 | Disposition: A | Discharge status: Home / Self Care | Payer: Medicare HMO | Source: Ambulatory Visit | Attending: Cardiology | Admitting: Cardiology

## 2016-02-10 DIAGNOSIS — R079 Chest pain, unspecified: Secondary | ICD-10-CM | POA: Diagnosis not present

## 2016-02-10 MED ORDER — TECHNETIUM TC 99M TETROFOSMIN IV KIT
30.0000 | PACK | Freq: Once | INTRAVENOUS | Status: AC | PRN
  Administered 2016-02-10: 30 via INTRAVENOUS

## 2016-02-10 MED ORDER — TECHNETIUM TC 99M TETROFOSMIN IV KIT
10.0000 | PACK | Freq: Once | INTRAVENOUS | Status: AC | PRN
  Administered 2016-02-10: 10 via INTRAVENOUS

## 2016-02-10 MED ORDER — REGADENOSON 0.4 MG/5ML IV SOLN
INTRAVENOUS | Status: AC
  Filled 2016-02-10: qty 5

## 2016-02-10 MED ORDER — REGADENOSON 0.4 MG/5ML IV SOLN
0.4000 mg | Freq: Once | INTRAVENOUS | Status: AC
  Administered 2016-02-10: 0.4 mg via INTRAVENOUS

## 2016-02-13 ENCOUNTER — Ambulatory Visit
Admission: RE | Admit: 2016-02-13 | Discharge: 2016-02-13 | Disposition: A | Discharge status: Home / Self Care | Payer: Medicare HMO | Source: Ambulatory Visit | Attending: Family Medicine | Admitting: Family Medicine

## 2016-02-13 DIAGNOSIS — Z1231 Encounter for screening mammogram for malignant neoplasm of breast: Secondary | ICD-10-CM

## 2016-02-17 ENCOUNTER — Ambulatory Visit (HOSPITAL_COMMUNITY)
Admission: RE | Admit: 2016-02-17 | Discharge: 2016-02-17 | Disposition: A | Discharge status: Home / Self Care | Payer: Medicare HMO | Source: Ambulatory Visit | Attending: Interventional Radiology | Admitting: Interventional Radiology

## 2016-02-17 ENCOUNTER — Ambulatory Visit (HOSPITAL_COMMUNITY): Payer: Medicare HMO

## 2016-02-17 DIAGNOSIS — I6521 Occlusion and stenosis of right carotid artery: Secondary | ICD-10-CM | POA: Insufficient documentation

## 2016-02-17 DIAGNOSIS — I729 Aneurysm of unspecified site: Secondary | ICD-10-CM

## 2016-02-17 DIAGNOSIS — I6501 Occlusion and stenosis of right vertebral artery: Secondary | ICD-10-CM | POA: Diagnosis not present

## 2016-02-17 DIAGNOSIS — I671 Cerebral aneurysm, nonruptured: Secondary | ICD-10-CM | POA: Insufficient documentation

## 2016-02-17 LAB — POCT I-STAT CREATININE: Creatinine, Ser: 1.1 mg/dL — ABNORMAL HIGH (ref 0.44–1.00)

## 2016-02-17 MED ORDER — IOPAMIDOL (ISOVUE-370) INJECTION 76%
INTRAVENOUS | Status: AC
  Administered 2016-02-17: 50 mL
  Filled 2016-02-17: qty 50

## 2016-02-24 ENCOUNTER — Encounter (HOSPITAL_COMMUNITY): Payer: Self-pay

## 2016-02-24 ENCOUNTER — Emergency Department (HOSPITAL_COMMUNITY)
Admission: EM | Admit: 2016-02-24 | Discharge: 2016-02-24 | Disposition: A | Discharge status: Home / Self Care | Payer: Medicare HMO | Attending: Emergency Medicine | Admitting: Emergency Medicine

## 2016-02-24 ENCOUNTER — Emergency Department (HOSPITAL_COMMUNITY): Payer: Medicare HMO

## 2016-02-24 DIAGNOSIS — I252 Old myocardial infarction: Secondary | ICD-10-CM | POA: Insufficient documentation

## 2016-02-24 DIAGNOSIS — Z7982 Long term (current) use of aspirin: Secondary | ICD-10-CM | POA: Diagnosis not present

## 2016-02-24 DIAGNOSIS — J449 Chronic obstructive pulmonary disease, unspecified: Secondary | ICD-10-CM | POA: Diagnosis not present

## 2016-02-24 DIAGNOSIS — I10 Essential (primary) hypertension: Secondary | ICD-10-CM | POA: Insufficient documentation

## 2016-02-24 DIAGNOSIS — Z951 Presence of aortocoronary bypass graft: Secondary | ICD-10-CM | POA: Insufficient documentation

## 2016-02-24 DIAGNOSIS — I2510 Atherosclerotic heart disease of native coronary artery without angina pectoris: Secondary | ICD-10-CM | POA: Diagnosis not present

## 2016-02-24 DIAGNOSIS — Z87891 Personal history of nicotine dependence: Secondary | ICD-10-CM | POA: Diagnosis not present

## 2016-02-24 DIAGNOSIS — R0602 Shortness of breath: Secondary | ICD-10-CM | POA: Diagnosis not present

## 2016-02-24 DIAGNOSIS — R112 Nausea with vomiting, unspecified: Secondary | ICD-10-CM

## 2016-02-24 DIAGNOSIS — Z79899 Other long term (current) drug therapy: Secondary | ICD-10-CM | POA: Diagnosis not present

## 2016-02-24 DIAGNOSIS — R197 Diarrhea, unspecified: Secondary | ICD-10-CM | POA: Diagnosis not present

## 2016-02-24 LAB — COMPREHENSIVE METABOLIC PANEL
ALK PHOS: 70 U/L (ref 38–126)
ALT: 10 U/L — AB (ref 14–54)
AST: 19 U/L (ref 15–41)
Albumin: 3.6 g/dL (ref 3.5–5.0)
Anion gap: 9 (ref 5–15)
BUN: 12 mg/dL (ref 6–20)
CALCIUM: 9.1 mg/dL (ref 8.9–10.3)
CO2: 21 mmol/L — AB (ref 22–32)
CREATININE: 0.96 mg/dL (ref 0.44–1.00)
Chloride: 107 mmol/L (ref 101–111)
GFR, EST NON AFRICAN AMERICAN: 57 mL/min — AB (ref >60)
Glucose, Bld: 125 mg/dL — ABNORMAL HIGH (ref 65–99)
Potassium: 3.7 mmol/L (ref 3.5–5.1)
Sodium: 137 mmol/L (ref 135–145)
Total Bilirubin: 0.5 mg/dL (ref 0.3–1.2)
Total Protein: 7.2 g/dL (ref 6.5–8.1)

## 2016-02-24 LAB — I-STAT TROPONIN, ED: Troponin i, poc: 0.01 ng/mL (ref 0.00–0.08)

## 2016-02-24 LAB — URINALYSIS, ROUTINE W REFLEX MICROSCOPIC
BILIRUBIN URINE: NEGATIVE
Glucose, UA: NEGATIVE mg/dL
HGB URINE DIPSTICK: NEGATIVE
KETONES UR: NEGATIVE mg/dL
Leukocytes, UA: NEGATIVE
Nitrite: NEGATIVE
PROTEIN: NEGATIVE mg/dL
Specific Gravity, Urine: 1.019 (ref 1.005–1.030)
pH: 5.5 (ref 5.0–8.0)

## 2016-02-24 LAB — CBC
HCT: 29.1 % — ABNORMAL LOW (ref 36.0–46.0)
Hemoglobin: 8.7 g/dL — ABNORMAL LOW (ref 12.0–15.0)
MCH: 23.8 pg — AB (ref 26.0–34.0)
MCHC: 29.9 g/dL — ABNORMAL LOW (ref 30.0–36.0)
MCV: 79.5 fL (ref 78.0–100.0)
PLATELETS: 307 10*3/uL (ref 150–400)
RBC: 3.66 MIL/uL — AB (ref 3.87–5.11)
RDW: 15.4 % (ref 11.5–15.5)
WBC: 9 10*3/uL (ref 4.0–10.5)

## 2016-02-24 LAB — BRAIN NATRIURETIC PEPTIDE: B NATRIURETIC PEPTIDE 5: 112.2 pg/mL — AB (ref 0.0–100.0)

## 2016-02-24 LAB — POC OCCULT BLOOD, ED: FECAL OCCULT BLD: NEGATIVE

## 2016-02-24 LAB — LIPASE, BLOOD: Lipase: 27 U/L (ref 11–51)

## 2016-02-24 MED ORDER — ONDANSETRON 4 MG PO TBDP
ORAL_TABLET | ORAL | Status: AC
  Filled 2016-02-24: qty 1

## 2016-02-24 MED ORDER — ONDANSETRON 4 MG PO TBDP
4.0000 mg | ORAL_TABLET | Freq: Once | ORAL | Status: AC | PRN
  Administered 2016-02-24: 4 mg via ORAL

## 2016-02-24 MED ORDER — IPRATROPIUM-ALBUTEROL 0.5-2.5 (3) MG/3ML IN SOLN
3.0000 mL | RESPIRATORY_TRACT | Status: DC | PRN
  Administered 2016-02-24: 3 mL via RESPIRATORY_TRACT
  Filled 2016-02-24: qty 3

## 2016-02-24 MED ORDER — METHYLPREDNISOLONE SODIUM SUCC 125 MG IJ SOLR
125.0000 mg | Freq: Once | INTRAMUSCULAR | Status: AC
  Administered 2016-02-24: 125 mg via INTRAVENOUS
  Filled 2016-02-24: qty 2

## 2016-02-24 NOTE — Discharge Instructions (Signed)

## 2016-02-24 NOTE — ED Notes (Signed)
Pt here for N/V/D since 0200 this morning. Having dry heaves currently. Feels dehydrated and is freezing. No fever.

## 2016-02-24 NOTE — ED Notes (Signed)
MD at bedside. 

## 2016-02-24 NOTE — ED Provider Notes (Signed)
CSN: [id number]     Arrival date & time 02/24/16  1302 History   First MD Initiated Contact with Patient 02/24/16 1506     Chief Complaint  Patient presents with  . Diarrhea  . Nausea   (Consider location/radiation/quality/duration/timing/severity/associated sxs/prior Treatment) Patient is a 75 y.o. female presenting with vomiting. The history is provided by the patient.  Emesis Severity:  Moderate Duration:  14 hours Timing:  Constant Quality:  Stomach contents Progression:  Partially resolved Chronicity:  New Recent urination:  Decreased Relieved by:  None tried Worsened by:  Nothing tried Associated symptoms: diarrhea   Associated symptoms: no abdominal pain, no chills and no sore throat     Past Medical History  Diagnosis Date  . Asthma   . Reflux   . Hiatal hernia   . Peripheral vascular disease ([place])   . Hypertension   . Hyperlipidemia   . Iliac artery aneurysm ([place])   . 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 Carotid artery occlusion   . Myocardial infarction [facility]) 01/01/2000    Cardiac catheterization  . COPD (chronic obstructive pulmonary disease) ([place])   . Coronary artery disease   . 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  . Gastroesophageal reflux disease   . Complication of anesthesia   . PONV (postoperative nausea and vomiting)     occassionally  . Diverticulitis    Past Surgical History  Procedure Laterality Date  . Eye surgery      Laser surgery for retinal hemorrhage  . Rotator cuff repair      Right  . Coronary artery bypass graft  01/01/2000  . Post coronary artery  bpg  01/05/2000    Right jugular sheath removed  . Carotid endarterectomy  03/29/2010    Left  CEA  . Pr vein bypass graft,aorto-fem-pop    . Arnold-chiari malformation repair  1998    Suboccipital craniectomy  . Corneal transplant      Right  . Appendectomy    . Abdominal  hysterectomy    . Left heart catheterization with coronary angiogram N/A 08/03/2014    Procedure: LEFT HEART CATHETERIZATION WITH CORONARY ANGIOGRAM;  Surgeon: Birdie Riddle, MD;  Location: [facility];  Service: Cardiovascular;  Laterality: N/A;  . Cholecystectomy      Gall Bladder  . Colonoscopy with propofol N/A 04/22/2015    Procedure: COLONOSCOPY WITH PROPOFOL;  Surgeon: Carol Ada, MD;  Location: [facility];  Service: Endoscopy;  Laterality: N/A;   Family History  Problem Relation Age of Onset  . Heart disease Mother     Heart Disease before age 48  . Hypertension Mother   . Hyperlipidemia Mother   . Heart attack Mother   . Clotting disorder Mother   . Heart disease Father     Heart Disease before age 15  . Heart attack Father   . Hyperlipidemia Father   . Hypertension Father   . Heart disease Brother     Heart Disease before age 52  . Hyperlipidemia Brother   . Hypertension Brother   . Clotting disorder Brother   . AAA (abdominal aortic aneurysm) Brother   . Cerebral aneurysm Sister   . Hypertension Sister   . AAA (abdominal aortic aneurysm) Sister   . Asthma Sister   . Cerebral aneurysm Brother   . Cancer Brother     Lung  . Hypertension Brother   . Heart attack Brother   .  Heart disease Brother     Aneurysm of Brain  . Hypertension Brother   . Heart disease Brother   . Heart disease Brother   . Stroke Son     Aneurysm of Stomach  . AAA (abdominal aortic aneurysm) Son    Social History  Substance Use Topics  . Smoking status: Former Smoker -- 1.00 packs/day for 30 years    Types: Cigarettes    Quit date: 08/13/2000  . Smokeless tobacco: Never Used  . Alcohol Use: No   OB History    No data available     Review of Systems  Constitutional: Negative for fever and chills.  HENT: Negative for congestion and sore throat.   Eyes: Negative for pain.  Respiratory: Positive for shortness of breath. Negative for cough.   Cardiovascular: Positive for leg  swelling. Negative for chest pain and palpitations.  Gastrointestinal: Positive for nausea, vomiting and diarrhea. Negative for abdominal pain.  Genitourinary: Negative for dysuria and flank pain.  Musculoskeletal: Negative for back pain and neck pain.  Skin: Negative for rash.  Allergic/Immunologic: Negative.   Neurological: Negative for dizziness and light-headedness.  Psychiatric/Behavioral: Negative for confusion.     Allergies  Hydromorphone; Levaquin; Codeine; Doxycycline; Oxycodone-acetaminophen; Risedronate; and Avelox  Home Medications   Prior to Admission medications   Medication Sig Start Date End Date Taking? Authorizing Provider  acetaminophen (TYLENOL) 500 MG tablet Take 1,000 mg by mouth every 6 (six) hours as needed for moderate pain or headache.   Yes Historical Provider, MD  albuterol (PROVENTIL) (2.5 MG/3ML) 0.083% nebulizer solution Take 2.5 mg by nebulization every 4 (four) hours as needed for wheezing or shortness of breath (((PLAN C))).    Yes Historical Provider, MD  albuterol (VENTOLIN HFA) 108 (90 BASE) MCG/ACT inhaler Inhale 2 puffs into the lungs every 4 (four) hours as needed for wheezing or shortness of breath (((PLAN B))). 08/09/13  Yes Pollie Friar, MD  aspirin EC 81 MG tablet Take 81 mg by mouth daily.    Yes Historical Provider, MD  AZOR 5-40 MG per tablet Take 1 tablet by mouth every morning. 10/08/14  Yes Historical Provider, MD  cetirizine (ZYRTEC) 1 MG/ML syrup Take 5 mg by mouth daily as needed (For allergies.).    Yes Historical Provider, MD  cloNIDine (CATAPRES) 0.1 MG tablet Take 0.05 mg by mouth 2 (two) times daily.    Yes Historical Provider, MD  clopidogrel (PLAVIX) 75 MG tablet Take 1 tablet (75 mg total) by mouth daily. 04/02/12  Yes Conrad [place], MD  dexlansoprazole (DEXILANT) 60 MG capsule Take 1 capsule (60 mg total) by mouth daily before breakfast. 11/17/12  Yes Tanda Rockers, MD  ipratropium-albuterol (DUONEB) 0.5-2.5 (3) MG/3ML SOLN Take 3  mLs by nebulization 4 (four) times daily. 10/24/15  Yes Janece Canterbury, MD  mometasone-formoterol (DULERA) 100-5 MCG/ACT AERO Inhale 2 puffs into the lungs 2 (two) times daily. 10/24/15  Yes Janece Canterbury, MD  nitroGLYCERIN (NITROSTAT) 0.4 MG SL tablet Place 0.4 mg under the tongue continuous as needed for chest pain.    Yes Historical Provider, MD  prednisoLONE acetate (PRED FORTE) 1 % ophthalmic suspension Place 2 drops into the right eye every other day.  02/26/14  Yes Historical Provider, MD  rosuvastatin (CRESTOR) 10 MG tablet Take 10 mg by mouth at bedtime.    Yes Historical Provider, MD   BP 120/51 mmHg  Pulse 66  Temp(Src) 98.4 F (36.9 C) (Oral)  Resp 28  Ht '5\' 2"'$  (  1.575 m)  Wt 76.204 kg  BMI 30.72 kg/m2  SpO2 94% Physical Exam  Constitutional: She is oriented to person, place, and time. She appears well-developed and well-nourished. No distress.  HENT:  Head: Normocephalic and atraumatic.  Eyes: Conjunctivae and EOM are normal. Pupils are equal, round, and reactive to light.  Neck: Normal range of motion. Neck supple.  Cardiovascular: Normal rate, regular rhythm and normal heart sounds.   Pulmonary/Chest: Effort normal. No respiratory distress. She has wheezes (diffuse).  Abdominal: Soft. Bowel sounds are normal. There is no tenderness.  Musculoskeletal: Normal range of motion. She exhibits edema (plus one bilatearlly).  Neurological: She is alert and oriented to person, place, and time. She has normal reflexes. No cranial nerve deficit.  Skin: Skin is warm and dry. She is not diaphoretic.  Psychiatric: She has a normal mood and affect.    ED Course  Procedures (including critical care time) Labs Review Labs Reviewed  COMPREHENSIVE METABOLIC PANEL - Abnormal; Notable for the following:    CO2 21 (*)    Glucose, Bld 125 (*)    ALT 10 (*)    GFR calc non Af Amer 57 (*)    All other components within normal limits  CBC - Abnormal; Notable for the following:    RBC 3.66  (*)    Hemoglobin 8.7 (*)    HCT 29.1 (*)    MCH 23.8 (*)    MCHC 29.9 (*)    All other components within normal limits  BRAIN NATRIURETIC PEPTIDE - Abnormal; Notable for the following:    B Natriuretic Peptide 112.2 (*)    All other components within normal limits  LIPASE, BLOOD  URINALYSIS, ROUTINE W REFLEX MICROSCOPIC (NOT AT [facility])  POC OCCULT BLOOD, ED  Randolm Idol, ED    Imaging Review Dg Chest 2 View  02/24/2016  CLINICAL DATA:  Vomiting.  Dehydration. EXAM: CHEST  2 VIEW COMPARISON:  None. FINDINGS: Lungs are hyperexpanded. The lungs are clear wiithout focal pneumonia, edema, pneumothorax or pleural effusion. Small nodular opacity projects at the left lung base. 9-10 mm parahilar nodule also noted. The cardiopericardial silhouette is within normal limits for size. Patient is status post CABG The visualized bony structures of the thorax are intact. IMPRESSION: 10 mm nodular density left lung base and right mid lung. CT chest without contrast recommended to further evaluate. Electronically Signed   By: Misty Stanley M.D.   On: 02/24/2016 17:34   I have personally reviewed and evaluated these images and lab results as part of my medical decision-making.   EKG Interpretation   Date/Time:  Friday February 24 2016 16:06:21 EDT Ventricular Rate:  76 PR Interval:    QRS Duration: 91 QT Interval:  367 QTC Calculation: 413 R Axis:   26 Text Interpretation:  Sinus rhythm Low voltage, precordial leads Baseline  wander in lead(s) V5 No significant change since last tracing Confirmed by  KNOTT MD, Quillian Quince ([id number]) on 02/24/2016 4:09:13 PM      MDM   Final diagnoses:  Nausea and vomiting, vomiting of unspecified type    The patient is a 75 year old female presenting to the emergency department for nausea vomiting and diarrhea earlier in the morning associated with cramping abdominal pain.  On evaluation the patient is hemodynamically stable and in no acute distress. Abdominal exam  with no tenderness. CMP and lipase unremarkable.  Low suspicion for acute surgical abdomen or pathology.  Given Zofran and tolerating by mouth in the ED.  Mild hemoglobin  drop but Hemoccult negative.  Likely gastroenteritis in setting of n/v and diarrhea without melena or signs of blood.   Patient reports moderate shortness of breath and chest x-ray performed showing no acute abnormalities. EKG troponin and BNP unremarkable.  No chest pain tachycardia or hypoxia to indicate PE at this timeLow suspicion for ACS or acute life-threatening etiology of shortness of breath.   Discussed with the pt the 77m nodular density on chest xray and recommended CT f/u through PCP for further evaluation.   Labs, ECG, and images were viewed by myself  incorporated into medical decision making.  Discussed pertinent finding with patient or caregiver prior to discharge with no further questions.  Immediate return precautions given and understood.  Medical decision making supervised by my attending Dr. KLaneta Simmers   MGeronimo Boot MD PGY-3 Emergency Medicine   MGeronimo Boot MD 02/24/16 2352  DLeo Grosser MD 02/25/16 [phone]

## 2016-03-02 ENCOUNTER — Other Ambulatory Visit (HOSPITAL_COMMUNITY): Payer: Self-pay | Admitting: Family Medicine

## 2016-03-02 DIAGNOSIS — R918 Other nonspecific abnormal finding of lung field: Secondary | ICD-10-CM

## 2016-03-13 ENCOUNTER — Ambulatory Visit: Payer: Medicare HMO | Admitting: Pulmonary Disease

## 2016-03-14 ENCOUNTER — Encounter (HOSPITAL_COMMUNITY)
Admission: RE | Admit: 2016-03-14 | Discharge: 2016-03-14 | Disposition: A | Discharge status: Home / Self Care | Payer: Medicare HMO | Source: Ambulatory Visit | Attending: Family Medicine | Admitting: Family Medicine

## 2016-03-14 DIAGNOSIS — R918 Other nonspecific abnormal finding of lung field: Secondary | ICD-10-CM

## 2016-03-14 DIAGNOSIS — J984 Other disorders of lung: Secondary | ICD-10-CM | POA: Diagnosis not present

## 2016-03-14 LAB — GLUCOSE, CAPILLARY: Glucose-Capillary: 117 mg/dL — ABNORMAL HIGH (ref 65–99)

## 2016-03-14 MED ORDER — FLUDEOXYGLUCOSE F - 18 (FDG) INJECTION: 8.3400 | Freq: Once | INTRAVENOUS | Status: DC | PRN

## 2016-04-03 ENCOUNTER — Telehealth (HOSPITAL_COMMUNITY): Payer: Self-pay

## 2016-04-03 NOTE — Telephone Encounter (Signed)
Called to schedule MRA head and neck. Pt stated that she is going to see a surgeon tomorrow bc her doc told her that she may have cancer. She will call back to schedule once she gets all of the cancer issues sorted out. AW

## 2016-04-04 ENCOUNTER — Encounter: Payer: Self-pay | Admitting: Surgery

## 2016-04-04 ENCOUNTER — Other Ambulatory Visit: Payer: Self-pay | Admitting: *Deleted

## 2016-04-04 ENCOUNTER — Institutional Professional Consult (permissible substitution) (INDEPENDENT_AMBULATORY_CARE_PROVIDER_SITE_OTHER): Payer: Medicare HMO | Admitting: Surgery

## 2016-04-04 VITALS — BP 162/66 | HR 65 | Resp 16 | Ht 62.0 in | Wt 168.0 lb

## 2016-04-04 DIAGNOSIS — D381 Neoplasm of uncertain behavior of trachea, bronchus and lung: Secondary | ICD-10-CM | POA: Diagnosis not present

## 2016-04-04 DIAGNOSIS — R911 Solitary pulmonary nodule: Secondary | ICD-10-CM

## 2016-04-07 ENCOUNTER — Encounter: Payer: Self-pay | Admitting: Surgery

## 2016-04-07 NOTE — Progress Notes (Signed)
Cardiothoracic Surgery Consultation  PCP is [name], MD Referring Provider is [name], MD  Chief Complaint  Patient presents with  . Lung Lesion    RLLobe...PET 03/14/16    HPI:  The patient is a 75 year old previous smoker with COPD, CAD s/p CABG by Dr. Roxy Manns, cerebrovascular disease s/p left CEA in 2011, aorto-iliac occlusive disease s/p stenting of both common iliac arteries and the left external iliac artery in 2009 who was seen in the ED in 02/2016 with nausea, vomiting and diarrhea. She had a CXR that showed a 10 mm nodular density at the left lung base and in the right mid lung. She saw Dr. Coletta Memos for follow up and a PET/CT was done showing a 9 mm nodule in the RLL with an SUV max of 3.2. There was nothing seen in the left lung and no other areas of hypermetabolic activity. Review of her radiology file shows that a CTA of the chest on 08/02/2014 showed a semi-solid nodular opacity in the superior segment of the RLL with central lucency that measured 6 x 7 mm and corresponded to this lesion. There was also a semisolid opacity in the medial segment of the RML measuring 9 x 7 mm that is unchanged on the current PET scan and is not hypermetabolic. Her office spirometry from the pulmonary office on 10/28/2015 showed Gold stage 2 COPD with an FEV1 of 1.19 (60% predicted).  She says that she gets short of breath with any activity, evening walking around in her house. She denies chest pain or pressure and had a nuclear stress test on 02/10/2016 showing an LVEF of 71% and no reversible ischemia or infarction.  Past Medical History:  Diagnosis Date  . 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 Arnold-Chiari malformation ([place]) 1998  . Asthma   . 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  . Carotid artery occlusion   . Complication of anesthesia   . COPD (chronic obstructive pulmonary disease) ([place])   . Coronary artery disease   .  Diverticulitis   . Gastroesophageal reflux disease   . Hiatal hernia   . Hyperlipidemia   . Hypertension   . Iliac artery aneurysm ([place])   . Myocardial infarction [facility]) 01/01/2000   Cardiac catheterization  . Peripheral vascular disease ([place])   . PONV (postoperative nausea and vomiting)    occassionally  . Reflux     Past Surgical History:  Procedure Laterality Date  . ABDOMINAL HYSTERECTOMY    . APPENDECTOMY    . Arnold-chiari malformation repair  1998   Suboccipital craniectomy  . CAROTID ENDARTERECTOMY  03/29/2010   Left  CEA  . CHOLECYSTECTOMY     Gall Bladder  . COLONOSCOPY WITH PROPOFOL N/A 04/22/2015   Procedure: COLONOSCOPY WITH PROPOFOL;  Surgeon: Carol Ada, MD;  Location: [facility];  Service: Endoscopy;  Laterality: N/A;  . CORNEAL TRANSPLANT     Right  . CORONARY ARTERY BYPASS GRAFT  01/01/2000  . EYE SURGERY     Laser surgery for retinal hemorrhage  . LEFT HEART CATHETERIZATION WITH CORONARY ANGIOGRAM N/A 08/03/2014   Procedure: LEFT HEART CATHETERIZATION WITH CORONARY ANGIOGRAM;  Surgeon: Birdie Riddle, MD;  Location: [facility];  Service: Cardiovascular;  Laterality: N/A;  . Post Coronary Artery  BPG  01/05/2000   Right jugular sheath removed  . PR VEIN BYPASS GRAFT,AORTO-FEM-POP    . ROTATOR CUFF REPAIR     Right  Family History  Problem Relation Age of Onset  . Heart disease Mother     Heart Disease before age 66  . Hypertension Mother   . Hyperlipidemia Mother   . Heart attack Mother   . Clotting disorder Mother   . Heart disease Father     Heart Disease before age 66  . Heart attack Father   . Hyperlipidemia Father   . Hypertension Father   . Heart disease Brother     Heart Disease before age 62  . Hyperlipidemia Brother   . Hypertension Brother   . Clotting disorder Brother   . AAA (abdominal aortic aneurysm) Brother   . Cerebral aneurysm Sister   . Hypertension Sister   . AAA (abdominal aortic aneurysm) Sister   . Asthma Sister     . Cerebral aneurysm Brother   . Cancer Brother     Lung  . Hypertension Brother   . Heart attack Brother   . Heart disease Brother     Aneurysm of Brain  . Hypertension Brother   . Heart disease Brother   . Heart disease Brother   . Stroke Son     Aneurysm of Stomach  . AAA (abdominal aortic aneurysm) Son     Social History Social History  Substance Use Topics  . Smoking status: Former Smoker    Packs/day: 1.50    Years: 30.00    Types: Cigarettes    Quit date: 08/13/2000  . Smokeless tobacco: Never Used  . Alcohol use No   Widowed: her husband died of lung cancer  Current Outpatient Prescriptions  Medication Sig Dispense Refill  . acetaminophen (TYLENOL) 500 MG tablet Take 1,000 mg by mouth every 6 (six) hours as needed for moderate pain or headache.    . albuterol (PROVENTIL) (2.5 MG/3ML) 0.083% nebulizer solution Take 2.5 mg by nebulization every 4 (four) hours as needed for wheezing or shortness of breath (((PLAN C))).     . albuterol (VENTOLIN HFA) 108 (90 BASE) MCG/ACT inhaler Inhale 2 puffs into the lungs every 4 (four) hours as needed for wheezing or shortness of breath (((PLAN B))). 1 Inhaler 11  . aspirin EC 81 MG tablet Take 81 mg by mouth daily.     . AZOR 5-40 MG per tablet Take 1 tablet by mouth every morning.  3  . cetirizine (ZYRTEC) 1 MG/ML syrup Take 5 mg by mouth daily as needed (For allergies.).     Marland Kitchen cloNIDine (CATAPRES) 0.1 MG tablet Take 0.05 mg by mouth 2 (two) times daily.     . clopidogrel (PLAVIX) 75 MG tablet Take 1 tablet (75 mg total) by mouth daily. 90 tablet 3  . dexlansoprazole (DEXILANT) 60 MG capsule Take 1 capsule (60 mg total) by mouth daily before breakfast. 30 capsule 11  . ipratropium-albuterol (DUONEB) 0.5-2.5 (3) MG/3ML SOLN Take 3 mLs by nebulization 4 (four) times daily. 360 mL 0  . mometasone-formoterol (DULERA) 100-5 MCG/ACT AERO Inhale 2 puffs into the lungs 2 (two) times daily. 13 g 0  . nitroGLYCERIN (NITROSTAT) 0.4 MG SL  tablet Place 0.4 mg under the tongue continuous as needed for chest pain.     . prednisoLONE acetate (PRED FORTE) 1 % ophthalmic suspension Place 2 drops into the right eye every other day.     . rosuvastatin (CRESTOR) 10 MG tablet Take 10 mg by mouth at bedtime.      No current facility-administered medications for this visit.     Allergies  Allergen Reactions  .  Hydromorphone Palpitations and Other (See Comments)    DILAUDID  -  Pt had a Heart Attack after taking Dilaudid.  . Levaquin [Levofloxacin] Other (See Comments)    Chest pressure, SOB, "pain in between shoulder blades", sweaty -as reported by patient per experience in ED this afternoon  . Codeine Other (See Comments)    Dr. Terrence Dupont advised patient not to take this medication  . Doxycycline     Swelling   . Oxycodone-Acetaminophen Other (See Comments)    Says it makes her feel weird  . Risedronate Other (See Comments)    Chest pain  . Avelox [Moxifloxacin Hcl In Nacl] Palpitations    Review of Systems  Constitutional: Positive for activity change, appetite change, fatigue and unexpected weight change. Negative for chills and fever.  HENT: Negative.   Eyes: Negative.   Respiratory: Positive for cough and shortness of breath.   Cardiovascular: Positive for leg swelling. Negative for chest pain.  Gastrointestinal: Negative.   Endocrine: Negative.   Genitourinary: Negative.   Musculoskeletal: Negative.   Allergic/Immunologic: Negative.   Neurological: Negative.   Hematological: Negative.   Psychiatric/Behavioral: Negative.     BP (!) 162/66   Pulse 65   Resp 16   Ht '5\' 2"'$  (1.575 m)   Wt 168 lb (76.2 kg)   SpO2 98% Comment: ON RA  BMI 30.73 kg/m  Physical Exam  Constitutional: She is oriented to person, place, and time.  Obese, elderly woman in no distress  HENT:  Head: Normocephalic and atraumatic.  Mouth/Throat: Oropharynx is clear and moist.  Eyes: EOM are normal. Pupils are equal, round, and reactive to  light.  Neck: Normal range of motion. Neck supple. No thyromegaly present.  Cardiovascular: Normal rate, regular rhythm and normal heart sounds.   No murmur heard. Pulmonary/Chest: Effort normal and breath sounds normal. No respiratory distress. She has no wheezes. She has no rales.  Sternotomy scar  Abdominal: Soft. Bowel sounds are normal. She exhibits no distension and no mass. There is no tenderness.  Musculoskeletal: Normal range of motion. She exhibits no edema.  Neurological: She is alert and oriented to person, place, and time.  Skin: Skin is warm and dry.  Psychiatric: She has a normal mood and affect.     Diagnostic Tests:  CLINICAL DATA:  Initial treatment strategy for RIGHT lower lobe pulmonary nodule. Smoking history.  EXAM: NUCLEAR MEDICINE PET SKULL BASE TO THIGH  TECHNIQUE: 8.3 mCi F-18 FDG was injected intravenously. Full-ring PET imaging was performed from the skull base to thigh after the radiotracer. CT data was obtained and used for attenuation correction and anatomic localization.  FASTING BLOOD GLUCOSE:  Value: 117 mg/dl  COMPARISON:  02/28/2016  FINDINGS: NECK  No hypermetabolic lymph nodes in the neck. Posterior fossa decompression.  CHEST  Nodule of concern in the RIGHT lower lobe measures 9 mm (image 70 6, series 4) and has associated metabolic activity with SUV max equal 3.2.  No additional hypermetabolic pulmonary nodules. No hypermetabolic hilar or mediastinal lymph nodes.  ABDOMEN/PELVIS  No abnormal hypermetabolic activity within the liver, pancreas, adrenal glands, or spleen. No hypermetabolic lymph nodes in the abdomen or pelvis. Bi-iliac stent grafts.  SKELETON  No aggressive osseous lesion.  IMPRESSION: 1. Hypermetabolic RIGHT lower lobe pulmonary nodule is most concerning for bronchogenic carcinoma. 2. No evidence mediastinal metastasis or distant metastasis. 3. Staging by FDG PET imaging T1a N0  M0   Electronically Signed   By: Suzy Bouchard M.D.   On: 03/14/2016  12:35   Impression:  She has a slowly enlarging nodule in the RLL that has grown from 7 mm in 07/2014 to 9 mm now. This is somewhat rounded and looks like it could be a carcinoid. It is hypermetabolic and enlarging so I think a biopsy is warranted. The RML density is essentially unchanged over that time and is not hypermetabolic so I think this is less significant. She is a 75 year old vasculopath with significant COPD and I don't think she would be a candidate for surgical resection but SBRT would be a good option if this is a cancer. I reviewed the PET/CT films with her and her family and my recommendation for a CT-guided needle biopsy. She is in agreement. All of their questions have been answered. She is on Plavix and this will probably need to be stopped 5 days before a biopsy.  Plan:  We will schedule a CT-guided needle biopsy and will stop the Plavix 5 days before.  We will get complete PFT's with diffusion capacity as a baseline to aid in decision-making.  She will return to see me after the biopsy to make further plans.  I spent 60 minutes performing this consultation and > 50% of this time was spent face to face counseling and coordinating the care of this patient's lung nodules.  Gaye Pollack, MD [facility] [phone]

## 2016-04-09 ENCOUNTER — Other Ambulatory Visit: Payer: Self-pay | Admitting: Radiology

## 2016-04-10 ENCOUNTER — Other Ambulatory Visit: Payer: Self-pay | Admitting: Physician Assistant

## 2016-04-10 ENCOUNTER — Other Ambulatory Visit: Payer: Self-pay | Admitting: Radiology

## 2016-04-10 ENCOUNTER — Ambulatory Visit (HOSPITAL_COMMUNITY)
Admission: RE | Admit: 2016-04-10 | Discharge: 2016-04-10 | Disposition: A | Discharge status: Home / Self Care | Payer: Medicare HMO | Source: Ambulatory Visit | Attending: Surgery | Admitting: Surgery

## 2016-04-10 DIAGNOSIS — C3431 Malignant neoplasm of lower lobe, right bronchus or lung: Secondary | ICD-10-CM | POA: Diagnosis not present

## 2016-04-10 DIAGNOSIS — R911 Solitary pulmonary nodule: Secondary | ICD-10-CM | POA: Diagnosis present

## 2016-04-10 LAB — PULMONARY FUNCTION TEST
DL/VA % pred: 86 %
DL/VA: 3.93 ml/min/mmHg/L
DLCO UNC: 11.07 ml/min/mmHg
DLCO unc % pred: 51 %
FEF 25-75 Post: 1.41 L/sec
FEF 25-75 Pre: 0.69 L/sec
FEF2575-%Change-Post: 105 %
FEF2575-%Pred-Post: 86 %
FEF2575-%Pred-Pre: 42 %
FEV1-%CHANGE-POST: 17 %
FEV1-%PRED-PRE: 54 %
FEV1-%Pred-Post: 64 %
FEV1-POST: 1.28 L
FEV1-Pre: 1.09 L
FEV1FVC-%Change-Post: -1 %
FEV1FVC-%Pred-Pre: 95 %
FEV6-%Change-Post: 19 %
FEV6-%PRED-PRE: 60 %
FEV6-%Pred-Post: 72 %
FEV6-POST: 1.82 L
FEV6-Pre: 1.53 L
FEV6FVC-%PRED-POST: 105 %
FEV6FVC-%PRED-PRE: 105 %
FVC-%Change-Post: 19 %
FVC-%PRED-POST: 69 %
FVC-%PRED-PRE: 57 %
FVC-POST: 1.82 L
FVC-PRE: 1.53 L
POST FEV6/FVC RATIO: 100 %
PRE FEV6/FVC RATIO: 100 %
Post FEV1/FVC ratio: 70 %
Pre FEV1/FVC ratio: 71 %
RV % PRED: 96 %
RV: 2.07 L
TLC % PRED: 79 %
TLC: 3.77 L

## 2016-04-10 MED ORDER — ALBUTEROL SULFATE (2.5 MG/3ML) 0.083% IN NEBU
2.5000 mg | INHALATION_SOLUTION | Freq: Once | RESPIRATORY_TRACT | Status: AC
  Administered 2016-04-10: 2.5 mg via RESPIRATORY_TRACT

## 2016-04-11 ENCOUNTER — Ambulatory Visit (HOSPITAL_COMMUNITY)
Admission: RE | Admit: 2016-04-11 | Discharge: 2016-04-11 | Disposition: A | Discharge status: Home / Self Care | Payer: Medicare HMO | Source: Ambulatory Visit | Attending: Surgery | Admitting: Surgery

## 2016-04-11 ENCOUNTER — Encounter (HOSPITAL_COMMUNITY): Payer: Self-pay

## 2016-04-11 ENCOUNTER — Ambulatory Visit (HOSPITAL_COMMUNITY)
Admission: RE | Admit: 2016-04-11 | Discharge: 2016-04-11 | Disposition: A | Discharge status: Home / Self Care | Payer: Medicare HMO | Source: Ambulatory Visit | Attending: Interventional Radiology | Admitting: Interventional Radiology

## 2016-04-11 DIAGNOSIS — Z9889 Other specified postprocedural states: Secondary | ICD-10-CM

## 2016-04-11 DIAGNOSIS — C3431 Malignant neoplasm of lower lobe, right bronchus or lung: Secondary | ICD-10-CM | POA: Diagnosis not present

## 2016-04-11 DIAGNOSIS — R911 Solitary pulmonary nodule: Secondary | ICD-10-CM

## 2016-04-11 LAB — CBC
HCT: 26.3 % — ABNORMAL LOW (ref 36.0–46.0)
Hemoglobin: 7.8 g/dL — ABNORMAL LOW (ref 12.0–15.0)
MCH: 22.7 pg — ABNORMAL LOW (ref 26.0–34.0)
MCHC: 29.7 g/dL — ABNORMAL LOW (ref 30.0–36.0)
MCV: 76.7 fL — AB (ref 78.0–100.0)
PLATELETS: 285 10*3/uL (ref 150–400)
RBC: 3.43 MIL/uL — ABNORMAL LOW (ref 3.87–5.11)
RDW: 16.8 % — AB (ref 11.5–15.5)
WBC: 6.2 10*3/uL (ref 4.0–10.5)

## 2016-04-11 LAB — PROTIME-INR
INR: 0.99
PROTHROMBIN TIME: 13.1 s (ref 11.4–15.2)

## 2016-04-11 LAB — APTT: APTT: 31 s (ref 24–36)

## 2016-04-11 MED ORDER — MIDAZOLAM HCL 2 MG/2ML IJ SOLN
INTRAMUSCULAR | Status: AC
  Filled 2016-04-11: qty 2

## 2016-04-11 MED ORDER — LIDOCAINE-EPINEPHRINE 1 %-1:100000 IJ SOLN
INTRAMUSCULAR | Status: AC
  Filled 2016-04-11: qty 1

## 2016-04-11 MED ORDER — SODIUM CHLORIDE 0.9 % IV SOLN: INTRAVENOUS | Status: DC

## 2016-04-11 MED ORDER — FENTANYL CITRATE (PF) 100 MCG/2ML IJ SOLN
INTRAMUSCULAR | Status: AC | PRN
  Administered 2016-04-11 (×2): 25 ug via INTRAVENOUS

## 2016-04-11 MED ORDER — MIDAZOLAM HCL 2 MG/2ML IJ SOLN
INTRAMUSCULAR | Status: AC | PRN
  Administered 2016-04-11 (×2): 0.5 mg via INTRAVENOUS

## 2016-04-11 MED ORDER — FENTANYL CITRATE (PF) 100 MCG/2ML IJ SOLN
INTRAMUSCULAR | Status: AC
  Filled 2016-04-11: qty 2

## 2016-04-11 NOTE — H&P (Signed)
Chief Complaint: Patient was seen in consultation today for right lung mass biopsy at the request of Gaye Pollack  Referring Physician(s): Gaye Pollack  Supervising Physician: Sandi Mariscal  Patient Status: Outpatient  History of Present Illness: [name] is a 75 y.o. female   Previous smoker Known COPD CAD/CABG PVD; B LE stents Uses Plavix daily---last dose 8/25  Presented to ED 02/2016 with N/V/D  Work up revealed Right lung mass Noted same was seen 2015---now larger +PET 03/14/16: IMPRESSION: 1. Hypermetabolic RIGHT lower lobe pulmonary nodule is most concerning for bronchogenic carcinoma. 2. No evidence mediastinal metastasis or distant metastasis. 3. Staging by FDG PET imaging T1a N0 M0  Scheduled now for Rt lung mass bx per Dr Cyndia Bent  Past Medical History:  Diagnosis Date  . 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 Asthma   . 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  . Carotid artery occlusion   . Complication of anesthesia   . COPD (chronic obstructive pulmonary disease) ([place])   . Coronary artery disease   . Diverticulitis   . Gastroesophageal reflux disease   . Hiatal hernia   . Hyperlipidemia   . Hypertension   . Iliac artery aneurysm ([place])   . Myocardial infarction [facility]) 01/01/2000   Cardiac catheterization  . Peripheral vascular disease ([place])   . PONV (postoperative nausea and vomiting)    occassionally  . Reflux     Past Surgical History:  Procedure Laterality Date  . ABDOMINAL HYSTERECTOMY    . APPENDECTOMY    . Arnold-chiari malformation repair  1998   Suboccipital craniectomy  . CAROTID ENDARTERECTOMY  03/29/2010   Left  CEA  . CHOLECYSTECTOMY     Gall Bladder  . COLONOSCOPY WITH PROPOFOL N/A 04/22/2015   Procedure: COLONOSCOPY WITH PROPOFOL;  Surgeon: Carol Ada, MD;  Location: [facility];  Service: Endoscopy;  Laterality: N/A;  .  CORNEAL TRANSPLANT     Right  . CORONARY ARTERY BYPASS GRAFT  01/01/2000  . EYE SURGERY     Laser surgery for retinal hemorrhage  . LEFT HEART CATHETERIZATION WITH CORONARY ANGIOGRAM N/A 08/03/2014   Procedure: LEFT HEART CATHETERIZATION WITH CORONARY ANGIOGRAM;  Surgeon: Birdie Riddle, MD;  Location: [place] [facility]ATH [facility];  Service: Cardiovascular;  Laterality: N/A;  . Post Coronary Artery  BPG  01/05/2000   Right jugular sheath removed  . PR VEIN BYPASS GRAFT,AORTO-FEM-POP    . ROTATOR CUFF REPAIR     Right    Allergies: Hydromorphone; Levaquin [levofloxacin]; Codeine; Doxycycline; Oxycodone-acetaminophen; Risedronate; and Avelox [moxifloxacin hcl in nacl]  Medications: Prior to Admission medications   Medication Sig Start Date End Date Taking? Authorizing Provider  acetaminophen (TYLENOL) 500 MG tablet Take 500-1,000 mg by mouth every 6 (six) hours as needed for moderate pain or headache.    Yes Historical Provider, MD  albuterol (VENTOLIN HFA) 108 (90 BASE) MCG/ACT inhaler Inhale 2 puffs into the lungs every 4 (four) hours as needed for wheezing or shortness of breath (((PLAN B))). 08/09/13  Yes Pollie Friar, MD  amLODipine-olmesartan (AZOR) 5-40 MG tablet Take 1 tablet by mouth daily.   Yes Historical Provider, MD  cloNIDine (CATAPRES) 0.1 MG tablet Take 0.05 mg by mouth 2 (two) times daily.    Yes Historical Provider, MD  dexlansoprazole (DEXILANT) 60 MG capsule Take 1 capsule (60 mg total) by mouth daily before breakfast. 11/17/12  Yes Tanda Rockers, MD  prednisoLONE acetate (PRED FORTE) 1 % ophthalmic suspension Place 1 drop into the right eye every other day.  02/26/14  Yes Historical Provider, MD  rosuvastatin (CRESTOR) 10 MG tablet Take 10 mg by mouth at bedtime.    Yes Historical Provider, MD  aspirin EC 81 MG tablet Take 81 mg by mouth at bedtime.     Historical Provider, MD  cetirizine (ZYRTEC) 1 MG/ML syrup Take 5 mg by mouth daily as needed (allergies).     Historical Provider, MD    clopidogrel (PLAVIX) 75 MG tablet Take 1 tablet (75 mg total) by mouth daily. 04/02/12   Conrad Enterprise, MD  ipratropium-albuterol (DUONEB) 0.5-2.5 (3) MG/3ML SOLN Take 3 mLs by nebulization 4 (four) times daily. Patient not taking: Reported on 04/09/2016 10/24/15   Janece Canterbury, MD  mometasone-formoterol [facility]) 100-5 MCG/ACT AERO Inhale 2 puffs into the lungs 2 (two) times daily. Patient not taking: Reported on 04/09/2016 10/24/15   Janece Canterbury, MD  nitroGLYCERIN (NITROSTAT) 0.4 MG SL tablet Place 0.4 mg under the tongue every 5 (five) minutes as needed for chest pain.     Historical Provider, MD     Family History  Problem Relation Age of Onset  . Heart disease Mother     Heart Disease before age 56  . Hypertension Mother   . Hyperlipidemia Mother   . Heart attack Mother   . Clotting disorder Mother   . Heart disease Father     Heart Disease before age 61  . Heart attack Father   . Hyperlipidemia Father   . Hypertension Father   . Heart disease Brother     Heart Disease before age 66  . Hyperlipidemia Brother   . Hypertension Brother   . Clotting disorder Brother   . AAA (abdominal aortic aneurysm) Brother   . Cerebral aneurysm Sister   . Hypertension Sister   . AAA (abdominal aortic aneurysm) Sister   . Asthma Sister   . Cerebral aneurysm Brother   . Cancer Brother     Lung  . Hypertension Brother   . Heart attack Brother   . Heart disease Brother     Aneurysm of Brain  . Hypertension Brother   . Heart disease Brother   . Heart disease Brother   . Stroke Son     Aneurysm of Stomach  . AAA (abdominal aortic aneurysm) Son     Social History   Social History  . Marital status: Widowed    Spouse name: N/A  . Number of children: 4  . Years of education: 7TH   Social History Main Topics  . Smoking status: Former Smoker    Packs/day: 1.50    Years: 30.00    Types: Cigarettes    Quit date: 08/13/2000  . Smokeless tobacco: Never Used  . Alcohol use No  . Drug  use: No  . Sexual activity: Not Asked   Other Topics Concern  . None   Social History Narrative   Lives at home w/ her son   Right-handed   Drinks coffee and Peps[employer] daily       Review of Systems: A 12 point ROS discussed and pertinent positives are indicated in the HPI above.  All other systems are negative.  Review of Systems  Constitutional: Negative for activity change, fatigue and fever.  Respiratory: Negative for cough and shortness of breath.   Cardiovascular: Negative for chest pain.  Gastrointestinal: Negative for nausea and vomiting.  Musculoskeletal: Negative for back pain  and gait problem.  Neurological: Negative for weakness.  Psychiatric/Behavioral: Negative for behavioral problems and confusion.    Vital Signs: BP 140/63   Pulse 65   Temp 98 F (36.7 C) (Oral)   Resp 18   Ht '5\' 2"'$  (1.575 m)   Wt 174 lb (78.9 kg)   SpO2 98%   BMI 31.83 kg/m   Physical Exam  Constitutional: She is oriented to person, place, and time.  Cardiovascular: Normal rate, regular rhythm and normal heart sounds.   Pulmonary/Chest: Effort normal and breath sounds normal.  Abdominal: Soft. Bowel sounds are normal. There is no tenderness.  Musculoskeletal: Normal range of motion.  Neurological: She is alert and oriented to person, place, and time.  Skin: Skin is warm and dry.  Psychiatric: She has a normal mood and affect. Her behavior is normal. Judgment and thought content normal.  Nursing note and vitals reviewed.   Mallampati Score:  MD Evaluation Airway: WNL Heart: WNL Abdomen: WNL Chest/ Lungs: WNL ASA  Classification: 3 Mallampati/Airway Score: One  Imaging: Nm Pet Image Initial (pi) Skull Base To Thigh  Result Date: 03/14/2016 CLINICAL DATA:  Initial treatment strategy for RIGHT lower lobe pulmonary nodule. Smoking history. EXAM: NUCLEAR MEDICINE PET SKULL BASE TO THIGH TECHNIQUE: 8.3 mCi F-18 FDG was injected intravenously. Full-ring PET imaging was performed from  the skull base to thigh after the radiotracer. CT data was obtained and used for attenuation correction and anatomic localization. FASTING BLOOD GLUCOSE:  Value: 117 mg/dl COMPARISON:  02/28/2016 FINDINGS: NECK No hypermetabolic lymph nodes in the neck. Posterior fossa decompression. CHEST Nodule of concern in the RIGHT lower lobe measures 9 mm (image 70 6, series 4) and has associated metabolic activity with SUV max equal 3.2. No additional hypermetabolic pulmonary nodules. No hypermetabolic hilar or mediastinal lymph nodes. ABDOMEN/PELVIS No abnormal hypermetabolic activity within the liver, pancreas, adrenal glands, or spleen. No hypermetabolic lymph nodes in the abdomen or pelvis. Bi-iliac stent grafts. SKELETON No aggressive osseous lesion. IMPRESSION: 1. Hypermetabolic RIGHT lower lobe pulmonary nodule is most concerning for bronchogenic carcinoma. 2. No evidence mediastinal metastasis or distant metastasis. 3. Staging by FDG PET imaging T1a N0 M0 Electronically Signed   By: Suzy Bouchard M.D.   On: 03/14/2016 12:35    Labs:  CBC:  Recent Labs  10/22/15 1631 10/22/15 1639 10/23/15 0535 02/24/16 1320 04/11/16 0900  WBC 13.7*  --  9.0 9.0 6.2  HGB 10.3* 11.2* 9.5* 8.7* 7.8*  HCT 30.6* 33.0* 28.6* 29.1* 26.3*  PLT 261  --  235 307 285    COAGS: No results for input(s): INR, APTT in the last 8760 hours.  BMP:  Recent Labs  08/07/15 1140 10/22/15 1639 10/23/15 0535 02/17/16 1246 02/24/16 1320  NA 141 138 140  --  137  K 4.5 3.8 4.0  --  3.7  CL 107 103 106  --  107  CO2 24  --  21*  --  21*  GLUCOSE 145* 143* 191*  --  125*  BUN 17 20 22*  --  12  CALCIUM 9.5  --  8.8*  --  9.1  CREATININE 1.11* 1.20* 1.27* 1.10* 0.96  GFRNONAA 48*  --  41*  --  57*  GFRAA 56*  --  48*  --  >60    LIVER FUNCTION TESTS:  Recent Labs  08/07/15 1140 10/23/15 0535 02/24/16 1320  BILITOT 0.6 0.3 0.5  AST '21 21 19  '$ ALT 12* 15 10*  ALKPHOS 85 67  70  PROT 7.9 7.0 7.2  ALBUMIN 3.7  3.2* 3.6    TUMOR MARKERS: No results for input(s): AFPTM, CEA, CA199, CHROMGRNA in the last 8760 hours.  Assessment and Plan:  Right lung mass Enlarging since 2015 +PET Now scheduled for biopsy Risks and Benefits discussed with the patient including, but not limited to bleeding, hemoptysis, respiratory failure requiring intubation, infection, pneumothorax requiring chest tube placement, stroke from air embolism or even death. All of the patient's questions were answered, patient is agreeable to proceed. Consent signed and in chart.   Thank you for this interesting consult.  I greatly enjoyed meeting CHANDY TARMAN and look forward to participating in their care.  A copy of this report was sent to the requesting provider on this date.  Electronically Signed: Jolynda Townley A 04/11/2016, 10:05 AM   I spent a total of  30 Minutes   in face to face in clinical consultation, greater than 50% of which was counseling/coordinating care for right lung mass bx

## 2016-04-11 NOTE — Progress Notes (Signed)
Pt remains stable at this time. No hemoptysis

## 2016-04-11 NOTE — Sedation Documentation (Signed)
Vital signs stable. 

## 2016-04-11 NOTE — Progress Notes (Signed)
Pt began coughing up blood after transfer to stretcher. Very small clot.Coughed once. Pt vitals remain stable, breathing normally, no shortness of breath. Report called to [place] but will transport pt to rad nurses station until post op cxr 1 hr at 1315. SS RN informed. Will continue to monitor pt

## 2016-04-11 NOTE — Sedation Documentation (Signed)
Patient is resting comfortably. No complaints at this time

## 2016-04-11 NOTE — Sedation Documentation (Signed)
Vital signs stable. No complaints from pt at this time. Pt able to follow breathing directions from MD

## 2016-04-11 NOTE — Progress Notes (Signed)
Pt remains stable. No complaints from pt at this time. No hemoptysis

## 2016-04-11 NOTE — Sedation Documentation (Signed)
Patient is resting comfortably. No complaints from pt at this time. Pt follows breathing instructions appropriately.

## 2016-04-11 NOTE — Sedation Documentation (Signed)
Vital signs stable. Pt able to follow breathing directions from MD

## 2016-04-11 NOTE — Discharge Instructions (Signed)

## 2016-04-11 NOTE — Procedures (Signed)
Technically successful CT guided biopsy of hypermetabolic right lower lobe pulmonary nodule.  EBL: None.  No immediate post procedural complications.   Ronny Bacon, MD Pager #: [phone]

## 2016-04-19 ENCOUNTER — Encounter: Payer: Self-pay | Admitting: Surgery

## 2016-04-19 ENCOUNTER — Encounter: Payer: Medicare HMO | Admitting: Surgery

## 2016-04-19 NOTE — Progress Notes (Signed)
This encounter was created in error - please disregard.  This encounter was created in error - please disregard.

## 2016-04-19 NOTE — Progress Notes (Signed)
  Patient did not show for her appt today so we called her. She says she did not know that she had an appt. I discussed her pathology with her. 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 Her PFT's are poor and she gets short of breath easily with multiple comorbid risk factors so I don't think surgical resection is indicated. She should be referred for SBRT. I discussed that with her and she is in agreement. I told her that I would have the nurse navigator call her to schedule appt for [facility] clinic.

## 2016-04-20 ENCOUNTER — Encounter: Payer: Self-pay | Admitting: *Deleted

## 2016-04-20 NOTE — Progress Notes (Signed)
Oncology Nurse Navigator Documentation  Oncology Nurse Navigator Flowsheets 04/20/2016  Navigator Encounter Type Other/I received a referral for [name] today.  I updated Rad Onc of referral.   Abnormal Finding Date 03/14/2016  Confirmed Diagnosis Date 04/11/2016  Treatment Phase Pre-Tx/Tx Discussion  Barriers/Navigation Needs Coordination of Care  Interventions Coordination of Care  Coordination of Care Appts  Acuity Level 2  Acuity Level 2 Assistance expediting appointments  Time Spent with Patient 30

## 2016-04-25 DIAGNOSIS — C3431 Malignant neoplasm of lower lobe, right bronchus or lung: Secondary | ICD-10-CM

## 2016-04-25 HISTORY — DX: Malignant neoplasm of lower lobe, right bronchus or lung: C34.31

## 2016-04-26 ENCOUNTER — Encounter: Payer: Self-pay | Admitting: Radiation Oncology

## 2016-04-26 NOTE — Progress Notes (Signed)
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 She was admitted to [facility] for treatment of pneumonia. Chest xray done at [facility] reveal lung lesion. Then, she followed up with Narda Amber. Nodule in the RLL grew from 7 mm in 07/2014 to 9 mm now.  Biopsies of RLL nodule (if applicable) revealed:    Tobacco/Marijuana/Snuff/ETOH use: Former smoker. Quit 2002.  Past/Anticipated interventions by cardiothoracic surgery, if any: patient is not a surgical candidate  Past/Anticipated interventions by medical oncology, if any: patient has been contact by lung navigator Norton Blizzard, RN   Signs/Symptoms  Weight changes, if any: Patient reports losing 10 lb unintentionally prior to biopsy. Reports since biopsy she has gained those pound back with increased SOB.  Respiratory complaints, if any: Reports SOB with exertion has increased, productive cough with white sputum, and long history of difficulty swallowing  Hemoptysis, if any: no  Pain issues, if any:  Reports intermittent right perineal pain   SAFETY ISSUES:  Prior radiation? no  Pacemaker/ICD?  no  Possible current pregnancy?no  Is the patient on methotrexate? no  Current Complaints / other details:  75 year old widow that lives with her son. Reports a poor appetite. Denies hot flashes. Reports cancer on her father side of the family but, unable to recall who and what type. Reports her brother had lung cancer but was a smoker; he passed away. Reports her husband died from lung cancer.

## 2016-04-27 ENCOUNTER — Encounter: Payer: Self-pay | Admitting: Radiation Oncology

## 2016-04-27 ENCOUNTER — Ambulatory Visit
Admission: RE | Admit: 2016-04-27 | Discharge: 2016-04-27 | Disposition: A | Discharge status: Home / Self Care | Payer: Medicare HMO | Source: Ambulatory Visit | Attending: Radiation Oncology | Admitting: Radiation Oncology

## 2016-04-27 DIAGNOSIS — Z510 Encounter for antineoplastic radiation therapy: Secondary | ICD-10-CM | POA: Diagnosis not present

## 2016-04-27 DIAGNOSIS — C3431 Malignant neoplasm of lower lobe, right bronchus or lung: Secondary | ICD-10-CM | POA: Insufficient documentation

## 2016-04-27 HISTORY — DX: Malignant neoplasm of unspecified part of unspecified bronchus or lung: C34.90

## 2016-04-27 NOTE — Progress Notes (Signed)
[place]         [phone] ________________________________  Initial outpatient Consultation  Name: [name] MRN: [id number]  Date: 04/27/2016  DOB: 1941-04-06  REFERRING PHYSICIAN: Gaye Pollack, MD  DIAGNOSIS: 75 y.o. 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 Primary cancer of right lower lobe of lung (HCC) 162.5 C34.31    HISTORY OF PRESENT ILLNESS::[name] is a 75 y.o. female who presented to the ED on 02/24/16 with nausea, diarrhea, and abdominal pain. CXR at the time revealed a 1 cm nodular density in the left lung base and right mid lung.   CXR     Chest CT     PET-CT    PET scan on 03/14/16 revealed a 0.9 cm metabolic nodule in the RLL with SUV max 3.2 and no evidence of mediastinal metastasis or distant metastasis.  Of note, CTA of the chest on 08/02/2014 showed a semi-solid nodular opacity in the superior segment of the RLL with central lucency that measured 6 x 7 mm and corresponded to this lesion. There was also a semisolid opacity in the medial segment of the RML measuring 9 x 7 mm that is unchanged on the current PET scan and is not hypermetabolic.  The patient presented to Dr. Cyndia Bent on 04/04/16 who believed the patient is not a good candidate for surgical resection, but SBRT was an option. CT-guided needle biopsy of the RLL nodule on 04/11/16 revealed squamous cell carcinoma.  CT-Biopsy, notable for small post-biopsy localized hemorrhage    The patient and her daughter present today to discuss the role of radiation in the management of her disease.  PREVIOUS RADIATION THERAPY: No  Past Medical History:  Diagnosis Date  . 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 Arnold-Chiari malformation ([place]) 1998  . Asthma   . 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  . Carotid artery occlusion   .  Complication of anesthesia   . COPD (chronic obstructive pulmonary disease) ([place])   . Coronary artery disease   . Diverticulitis   . Gastroesophageal reflux disease   . Hiatal hernia   . Hyperlipidemia   . Hypertension   . Iliac artery aneurysm ([place])   . 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 Myocardial infarction [facility]) 01/01/2000   Cardiac catheterization  . Peripheral vascular disease ([place])   . PONV (postoperative nausea and vomiting)    occassionally  . Reflux   :   Past Surgical History:  Procedure Laterality Date  . ABDOMINAL HYSTERECTOMY    . APPENDECTOMY    . Arnold-chiari malformation repair  1998   Suboccipital craniectomy  . CAROTID ENDARTERECTOMY  03/29/2010   Left  CEA  . CHOLECYSTECTOMY     Gall Bladder  . COLONOSCOPY WITH PROPOFOL N/A 04/22/2015   Procedure: COLONOSCOPY WITH PROPOFOL;  Surgeon: Carol Ada, MD;  Location: [facility];  Service: Endoscopy;  Laterality: N/A;  . CORNEAL TRANSPLANT     Right  . CORONARY ARTERY BYPASS GRAFT  01/01/2000  . EYE SURGERY     Laser surgery for retinal hemorrhage  . LEFT HEART CATHETERIZATION WITH CORONARY ANGIOGRAM N/A 08/03/2014   Procedure: LEFT HEART CATHETERIZATION WITH CORONARY ANGIOGRAM;  Surgeon: Birdie Riddle, MD;  Location: [place] CATH LAB;  Service: Cardiovascular;  Laterality: N/A;  .  Post Coronary Artery  BPG  01/05/2000   Right jugular sheath removed  . PR VEIN BYPASS GRAFT,AORTO-FEM-POP    . ROTATOR CUFF REPAIR     Right  :   Current Outpatient Prescriptions:  .  acetaminophen (TYLENOL) 500 MG tablet, Take 500-1,000 mg by mouth every 6 (six) hours as needed for moderate pain or headache. , Disp: , Rfl:  .  albuterol (VENTOLIN HFA) 108 (90 BASE) MCG/ACT inhaler, Inhale 2 puffs into the lungs every 4 (four) hours as needed for wheezing or shortness of breath (((PLAN B)))., Disp: 1 Inhaler, Rfl: 11 .  amLODipine-olmesartan (AZOR) 5-40 MG tablet, Take 1 tablet by mouth daily., Disp: , Rfl:  .   aspirin EC 81 MG tablet, Take 81 mg by mouth at bedtime. , Disp: , Rfl:  .  cetirizine (ZYRTEC) 1 MG/ML syrup, Take 5 mg by mouth daily as needed (allergies). , Disp: , Rfl:  .  cloNIDine (CATAPRES) 0.1 MG tablet, Take 0.05 mg by mouth 2 (two) times daily. , Disp: , Rfl:  .  clopidogrel (PLAVIX) 75 MG tablet, Take 1 tablet (75 mg total) by mouth daily., Disp: 90 tablet, Rfl: 3 .  dexlansoprazole (DEXILANT) 60 MG capsule, Take 1 capsule (60 mg total) by mouth daily before breakfast., Disp: 30 capsule, Rfl: 11 .  ferrous sulfate 325 (65 FE) MG tablet, Take 325 mg by mouth daily with breakfast., Disp: , Rfl:  .  ipratropium-albuterol (DUONEB) 0.5-2.5 (3) MG/3ML SOLN, Take 3 mLs by nebulization 4 (four) times daily., Disp: 360 mL, Rfl: 0 .  mometasone-formoterol (DULERA) 100-5 MCG/ACT AERO, Inhale 2 puffs into the lungs 2 (two) times daily., Disp: 13 g, Rfl: 0 .  nitroGLYCERIN (NITROSTAT) 0.4 MG SL tablet, Place 0.4 mg under the tongue every 5 (five) minutes as needed for chest pain. , Disp: , Rfl:  .  prednisoLONE acetate (PRED FORTE) 1 % ophthalmic suspension, Place 1 drop into the right eye every other day. , Disp: , Rfl:  .  rosuvastatin (CRESTOR) 10 MG tablet, Take 10 mg by mouth at bedtime. , Disp: , Rfl: :   Allergies  Allergen Reactions  . Hydromorphone Palpitations and Other (See Comments)    DILAUDID  -  Pt had a Heart Attack after taking Dilaudid.  . Levaquin [Levofloxacin] Other (See Comments)    Chest pressure, SOB, "pain in between shoulder blades", sweaty -as reported by patient per experience in ED this afternoon  . Codeine Other (See Comments)    Dr. Terrence Dupont advised patient not to take this medication  . Doxycycline Swelling    Mouth, lips, feet swelling  . Oxycodone-Acetaminophen Other (See Comments)    Says it makes her feel weird  . Risedronate Other (See Comments)    Chest pain  . Avelox [Moxifloxacin Hcl In Nacl] Palpitations  :   Family History  Problem Relation Age  of Onset  . Heart disease Mother     Heart Disease before age 68  . Hypertension Mother   . Hyperlipidemia Mother   . Heart attack Mother   . Clotting disorder Mother   . Heart disease Father     Heart Disease before age 71  . Heart attack Father   . Hyperlipidemia Father   . Hypertension Father   . Heart disease Brother     Heart Disease before age 71  . Hyperlipidemia Brother   . Hypertension Brother   . Clotting disorder Brother   . AAA (abdominal aortic aneurysm) Brother   .  Cerebral aneurysm Sister   . Hypertension Sister   . AAA (abdominal aortic aneurysm) Sister   . Asthma Sister   . Cerebral aneurysm Brother   . Cancer Brother     Lung  . Hypertension Brother   . Heart attack Brother   . Heart disease Brother     Aneurysm of Brain  . Hypertension Brother   . Heart disease Brother   . Heart disease Brother   . Stroke Son     Aneurysm of Stomach  . AAA (abdominal aortic aneurysm) Son   . Cancer Maternal Uncle     great uncle/cancer/type unknown  :   Social History   Social History  . Marital status: Widowed    Spouse name: N/A  . Number of children: 4  . Years of education: 7TH   Occupational History  . Not on file.   Social History Main Topics  . Smoking status: Former Smoker    Packs/day: 1.50    Years: 30.00    Types: Cigarettes    Quit date: 08/13/2000  . Smokeless tobacco: Never Used  . Alcohol use No  . Drug use: No  . Sexual activity: No   Other Topics Concern  . Not on file   Social History Narrative   Lives at home w/ her son   Right-handed   Drinks coffee and [employer]eps[employer] daily     :  REVIEW OF SYSTEMS:  A 15 point review of systems is documented in the electronic medical record. This was obtained by the nursing staff. However, I reviewed this with the patient to discuss relevant findings and make appropriate changes.  Pertinent items noted in HPI and remainder of comprehensive ROS otherwise negative.   The patient reports losing 10 lbs  unintentionally prior to biopsy and that since the biopsy she has gained those pound back. She has SOB with exertion has increased, a productive cough with white sputum, and a long history of difficulty swallowing. The patient reports intermittent right perineal pain. She reports a poor appetite. She denies hemoptysis.  PHYSICAL EXAM:  Blood pressure (!) 159/69, pulse 61, resp. rate 18, height '5\' 2"'$  (1.575 m), weight 174 lb 12.8 oz (79.3 kg), SpO2 100 %.  Alert and oriented x 3. Normal respirator effort.  KPS = 90  100 - Normal; no complaints; no evidence of disease. 90   - Able to carry on normal activity; minor signs or symptoms of disease. 80   - Normal activity with effort; some signs or symptoms of disease. 33   - Cares for self; unable to carry on normal activity or to do active work. 60   - Requires occasional assistance, but is able to care for most of his personal needs. 50   - Requires considerable assistance and frequent medical care. 103   - Disabled; requires special care and assistance. 44   - Severely disabled; hospital admission is indicated although death not imminent. 22   - Very sick; hospital admission necessary; active supportive treatment necessary. 10   - Moribund; fatal processes progressing rapidly. 0     - Dead  Karnofsky DA, Abelmann Fairbanks, Craver LS and Burchenal [facility] [phone]) The use of the nitrogen mustards in the palliative treatment of carcinoma: with particular reference to bronchogenic carcinoma Cancer 1 634-56  LABORATORY DATA:  Lab Results  Component Value Date   WBC 6.2 04/11/2016   HGB 7.8 (L) 04/11/2016   HCT 26.3 (L) 04/11/2016   MCV 76.7 (L) 04/11/2016  PLT 285 04/11/2016   Lab Results  Component Value Date   NA 137 02/24/2016   K 3.7 02/24/2016   CL 107 02/24/2016   CO2 21 (L) 02/24/2016   Lab Results  Component Value Date   ALT 10 (L) 02/24/2016   AST 19 02/24/2016   ALKPHOS 70 02/24/2016   BILITOT 0.5 02/24/2016     RADIOGRAPHY: Dg  Chest 1 View  Result Date: 04/11/2016 CLINICAL DATA:  Post CT-guided biopsy of right lower lobe lung lesion EXAM: CHEST 1 VIEW COMPARISON:  CT chest of 02/28/2016 FINDINGS: After biopsy of the right lower lobe lung lesion, no pneumothorax is seen. There is more parenchymal opacity most likely due to post biopsy hemorrhage. The left lung is clear. No effusion is seen. Heart size is stable. IMPRESSION: 1. No pneumothorax after biopsy of the right lower lobe lung lesion. 2. Opacity near the nodule in the right lower lobe probably represents post biopsy hemorrhage. Electronically Signed   By: Ivar Drape M.D.   On: 04/11/2016 13:21   Ct Biopsy  Addendum Date: 04/11/2016   ADDENDUM REPORT: 04/11/2016 17:54 ADDENDUM: The following is a correction to the above report. COMPLICATIONS: The procedure was complicated by a minimal amount of peribiopsy hemorrhage and a solitary episode of a tiny amount of self-limited hemoptysis. The patient was placed in right lateral decubitus positioning and observed for 1 hour following the procedure in the interventional radiology department without an additional episode of hemoptysis. Electronically Signed   By: Sandi Mariscal M.D.   On: 04/11/2016 17:54   Result Date: 04/11/2016 INDICATION: Indeterminate right lower lobe hypermetabolic pulmonary nodule. Please perform CT-guided biopsy for tissue diagnostic purposes. EXAM: CT-GUIDED BIOPSY OF HYPERMETABOLIC RIGHT LOWER LOBE PULMONARY NODULE. COMPARISON:  PET-CT- 03/14/2016; chest CT- 02/28/2016 MEDICATIONS: None. ANESTHESIA/SEDATION: Fentanyl 50 mcg IV; Versed 1 mg IV Sedation time: 20 minutes; The patient was continuously monitored during the procedure by the interventional radiology nurse under my direct supervision. CONTRAST:  None COMPLICATIONS: SIR Level A - No therapy, no consequence. PROCEDURE: Informed consent was obtained from the patient following an explanation of the procedure, risks, benefits and alternatives. The patient  understands,agrees and consents for the procedure. All questions were addressed. A time out was performed prior to the initiation of the procedure. The patient was positioned right lateral decubitus on the CT table and a limited chest CT was performed for procedural planning demonstrating unchanged size and appearance of the approximately 1.1 cm right lower lobe pulmonary nodule (image 44, series 2). The operative site was prepped and draped in the usual sterile fashion. Under sterile conditions and local anesthesia, a 17 gauge coaxial needle was advanced into the inferior lateral aspect of the nodule. Positioning was confirmed with intermittent CT fluoroscopy and followed by the acquisition of 3 core needle biopsies with an 18 gauge core needle biopsy device. The coaxial needle was removed following deployment of a Biosentry plug and superficial hemostasis was achieved with manual compression. Limited post procedural chest CT was negative for pneumothorax or additional complication. A dressing was placed. The patient tolerated the procedure well without immediate postprocedural complication. The patient was escorted to have an upright chest radiograph. IMPRESSION: Technically successful CT guided core needle core biopsy of indeterminate approximately 1.1 cm hypermetabolic right lower lobe pulmonary nodule. Electronically Signed: By: Sandi Mariscal M.D. On: 04/11/2016 13:47      IMPRESSION: 75 y.o. 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  The patient is a good candidate for SBRT to the RLL nodule to provide local control.  PLAN: Today, I talked to the patient and family about the findings and work-up thus far.  We discussed the natural history of stage IA squamous cell carcinoma of the RLL and general treatment, highlighting the role of radiotherapy in the management.  We discussed the available radiation techniques, and focused on the details of logistics  and delivery.  We reviewed the anticipated acute and late sequelae associated with radiation in this setting. We discussed 3- 5 treatments occurring every other day as an outpatient. The patient was encouraged to ask questions that I answered to the best of my ability.   I filled out a patient counseling form during our discussion including treatment diagrams.  We retained a copy for our records.  The patient would like to proceed with radiation and CT simulation is scheduled on 05/11/16 at 10AM.  I spent time minutes face to face with the patient and more than 50% of that time was spent in counseling and/or coordination of care.   ------------------------------------------------   Tyler Pita, MD [place] Director and Director of Stereotactic Radiosurgery Direct Dial: [phone]  Fax: [phone] [email or web address]  Skype  [email or web address]   This document serves as a record of services personally performed by Tyler Pita, MD. It was created on his behalf by Darcus Austin, a trained medical scribe. The creation of this record is based on the scribe's personal observations and the provider's statements to them. This document has been checked and approved by the attending provider.

## 2016-04-27 NOTE — Progress Notes (Signed)
See progress note under physician encounter. 

## 2016-04-30 ENCOUNTER — Encounter: Payer: Self-pay | Admitting: Adult Health

## 2016-04-30 ENCOUNTER — Ambulatory Visit (INDEPENDENT_AMBULATORY_CARE_PROVIDER_SITE_OTHER)
Admission: RE | Admit: 2016-04-30 | Discharge: 2016-04-30 | Disposition: A | Discharge status: Home / Self Care | Payer: Medicare HMO | Source: Ambulatory Visit | Attending: Adult Health | Admitting: Adult Health

## 2016-04-30 ENCOUNTER — Ambulatory Visit (INDEPENDENT_AMBULATORY_CARE_PROVIDER_SITE_OTHER): Payer: Medicare HMO | Admitting: Adult Health

## 2016-04-30 ENCOUNTER — Encounter: Payer: Self-pay | Admitting: Hematology & Oncology

## 2016-04-30 VITALS — BP 130/66 | HR 68 | Temp 97.4°F | Ht 62.0 in | Wt 173.0 lb

## 2016-04-30 DIAGNOSIS — J449 Chronic obstructive pulmonary disease, unspecified: Secondary | ICD-10-CM

## 2016-04-30 MED ORDER — MOMETASONE FURO-FORMOTEROL FUM 100-5 MCG/ACT IN AERO: 2.0000 | INHALATION_SPRAY | Freq: Two times a day (BID) | RESPIRATORY_TRACT | 5 refills | Status: DC

## 2016-04-30 NOTE — Progress Notes (Signed)
Subjective:    Patient ID: [name], female    DOB: Jul 13, 1941, 75 y.o.   MRN: [id number]  HPI 75 yo female former smoker with recently dx stage 1A squamoous cell carcinoma in RLL.03/2016  She had known GOLD II COPD   TEST  PET scan on 03/14/16 revealed a 0.9 cm metabolic nodule in the RLL with SUV max 3.2 and no evidence of mediastinal metastasis or distant metastasis. CT bx on 04/11/16 showed squamous cell carcinoma. PFT 04/10/16 FEV1 64%, ratio 70, FVC 69%, DLCO 50%.   04/30/2016 Acute OV  Pt presents for an acute office visit. Complains of progressive DOE over last 3 weeks. Recently dx with lung cancer. PET scan on 03/14/16 revealed a 0.9 cm metabolic nodule in the RLL with SUV max 3.2 and no evidence of mediastinal metastasis or distant metastasis. CT bx on 04/11/16 showed squamous cell carcinoma. Not a surgical candidiate. Seen by Rad Onc on 9/15 with plans for XRT .  Says since bx on 04/11/16 she has had pain along right lateral ribs and worsening DOE .  CT-Biopsy, notable for small post-biopsy localized hemorrhage. Today cxr shows no changes in nodular densities in R/L bases .  She does have some cough and congestion mainly clear to white .  Denies calf pain, edema , orthopnea, or hemoptysis  Suppose to be on  on Dulera. Twice daily  And Duoneb Four times a day   Says she is not taking either for a long time.  We discussed restarting .     Past Medical History:  Diagnosis Date  . 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 Arnold-Chiari malformation ([place]) 1998  . Asthma   . 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  . Carotid artery occlusion   . Complication of anesthesia   . COPD (chronic obstructive pulmonary disease) ([place])   . Coronary artery disease   . Diverticulitis   . Gastroesophageal reflux disease   . Hiatal hernia   . Hyperlipidemia   . Hypertension   . Iliac artery aneurysm ([place])   . 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 Myocardial infarction [facility]) 01/01/2000   Cardiac catheterization  . Peripheral vascular disease ([place])   . PONV (postoperative nausea and vomiting)    occassionally  . Reflux    Current Outpatient Prescriptions on File Prior to Visit  Medication Sig Dispense Refill  . acetaminophen (TYLENOL) 500 MG tablet Take 500-1,000 mg by mouth every 6 (six) hours as needed for moderate pain or headache.     . albuterol (VENTOLIN HFA) 108 (90 BASE) MCG/ACT inhaler Inhale 2 puffs into the lungs every 4 (four) hours as needed for wheezing or shortness of breath (((PLAN B))). 1 Inhaler 11  . amLODipine-olmesartan (AZOR) 5-40 MG tablet Take 1 tablet by mouth daily.    Marland Kitchen aspirin EC 81 MG tablet Take 81 mg by mouth at bedtime.     . cetirizine (ZYRTEC) 1 MG/ML syrup Take 5 mg by mouth daily as needed (allergies).     . cloNIDine (CATAPRES) 0.1 MG tablet Take 0.05 mg by mouth 2 (two) times daily.     . clopidogrel (PLAVIX) 75 MG tablet Take 1 tablet (75 mg total) by mouth daily. 90 tablet 3  . dexlansoprazole (DEXILANT) 60 MG capsule Take 1 capsule (60 mg total) by mouth daily before breakfast. 30 capsule 11  . ipratropium-albuterol (DUONEB) 0.5-2.5 (3) MG/3ML SOLN  Take 3 mLs by nebulization 4 (four) times daily. 360 mL 0  . mometasone-formoterol (DULERA) 100-5 MCG/ACT AERO Inhale 2 puffs into the lungs 2 (two) times daily. 13 g 0  . nitroGLYCERIN (NITROSTAT) 0.4 MG SL tablet Place 0.4 mg under the tongue every 5 (five) minutes as needed for chest pain.     . prednisoLONE acetate (PRED FORTE) 1 % ophthalmic suspension Place 1 drop into the right eye every other day.     . rosuvastatin (CRESTOR) 10 MG tablet Take 10 mg by mouth at bedtime.     . ferrous sulfate 325 (65 FE) MG tablet Take 325 mg by mouth daily with breakfast.     No current facility-administered medications on file prior to visit.       Review of Systems Constitutional:   No  weight loss, night sweats,  Fevers,  chills,  +fatigue, or  lassitude.  HEENT:   No headaches,  Difficulty swallowing,  Tooth/dental problems, or  Sore throat,                No sneezing, itching, ear ache, nasal congestion, post nasal drip,   CV:  No chest pain,  Orthopnea, PND, swelling in lower extremities, anasarca, dizziness, palpitations, syncope.   GI  No heartburn, indigestion, abdominal pain, nausea, vomiting, diarrhea, change in bowel habits, loss of appetite, bloody stools.   Resp:  .  No chest wall deformity  Skin: no rash or lesions.  GU: no dysuria, change in color of urine, no urgency or frequency.  No flank pain, no hematuria   MS:  No joint pain or swelling.  No decreased range of motion.  No back pain.  Psych:  No change in mood or affect. No depression or anxiety.  No memory loss.    '    Objective:   Physical Exam Vitals:   04/30/16 1113  BP: 130/66  Pulse: 68  Temp: 97.4 F (36.3 C)  TempSrc: Oral  SpO2: 97%  Weight: 173 lb (78.5 kg)  Height: '5\' 2"'$  (1.575 m)   GEN: A/Ox3; pleasant , NAD, chronically ill appearing    HEENT:  [place]/AT,  EACs-clear, TMs-wnl, NOSE-clear, THROAT-clear, no lesions, no postnasal drip or exudate noted.   NECK:  Supple w/ fair ROM; no JVD; normal carotid impulses w/o bruits; no thyromegaly or nodules palpated; no lymphadenopathy.    RESP  dimnished BS in bases with out , wheezes/ rales/ or rhonchi. no accessory muscle use, no dullness to percussion  CARD:  RRR, no m/r/g  , no peripheral edema, pulses intact, no cyanosis or clubbing.  GI:   Soft & nt; nml bowel sounds; no organomegaly or masses detected.   Musco: Warm bil, no deformities or joint swelling noted.   Neuro: alert, no focal deficits noted.    Skin: Warm, no lesions or rashes  Tammy Parrett NP-C  [facility]  04/30/2016        Assessment & Plan:

## 2016-04-30 NOTE — Patient Instructions (Signed)
Restart Dulera 2 puffs Twice daily  , rinse after use.  Use Duoneb every 6hr as needed.  Follow up with Dr. Lenna Gilford  In 6 weeks and As needed   Please contact office for sooner follow up if symptoms do not improve or worsen or seek emergency care

## 2016-04-30 NOTE — Assessment & Plan Note (Signed)
GOLD II COPD suspect more symptomatic b/c off maintence inhalers CXR w/o acute process. Exam unrevealing  If persists or worsens will need further workup   Plan  Patient Instructions  Restart Dulera 2 puffs Twice daily  , rinse after use.  Use Duoneb every 6hr as needed.  Follow up with Dr. Lenna Gilford  In 6 weeks and As needed   Please contact office for sooner follow up if symptoms do not improve or worsen or seek emergency care

## 2016-05-09 ENCOUNTER — Telehealth: Payer: Self-pay | Admitting: Hematology & Oncology

## 2016-05-09 NOTE — Telephone Encounter (Signed)
Received call from [facility] [place] [phone]). Anderson Malta stated that she called [employer] for authorization for patient's visits with Dr. Marin Olp. Anderson Malta spoke with a CSR named Valentina Gu. (Call Ref # [id number] on 05/09/2016). Valentina Gu. Stated that no authorization is needed.       [facility].

## 2016-05-11 ENCOUNTER — Ambulatory Visit
Admission: RE | Admit: 2016-05-11 | Discharge: 2016-05-11 | Disposition: A | Discharge status: Home / Self Care | Payer: Medicare HMO | Source: Ambulatory Visit | Attending: Radiation Oncology | Admitting: Radiation Oncology

## 2016-05-11 DIAGNOSIS — C3431 Malignant neoplasm of lower lobe, right bronchus or lung: Secondary | ICD-10-CM

## 2016-05-11 DIAGNOSIS — Z510 Encounter for antineoplastic radiation therapy: Secondary | ICD-10-CM | POA: Diagnosis not present

## 2016-05-11 NOTE — Progress Notes (Signed)
  Radiation Oncology         [phone] ________________________________  Name: [name] MRN: [id number]  Date: 05/11/2016  DOB: [birth year]  STEREOTACTIC BODY RADIOTHERAPY SIMULATION AND TREATMENT PLANNING NOTE    ICD-9-CM ICD-10-CM   1. Primary cancer of right lower lobe of lung (HCC) 162.5 C34.31     DIAGNOSIS:  75 y.o. 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  Identity was confirmed.  All relevant records and images related to the planned course of therapy were reviewed.  The patient freely provided informed written consent to proceed with treatment after reviewing the details related to the planned course of therapy. The consent form was witnessed and verified by the simulation staff.  Then, the patient was set-up in a stable reproducible  supine position for radiation therapy.  A BodyFix immobilization pillow was fabricated for reproducible positioning.  Then I personally applied the abdominal compression paddle to limit respiratory excursion.  4D respiratoy motion management CT images were obtained.  Surface markings were placed.  The CT images were loaded into the planning software.  Then, using Cine, MIP, and standard views, the internal target volume (ITV) and planning target volumes (PTV) were delinieated, and avoidance structures were contoured.  Treatment planning then occurred.  The radiation prescription was entered and confirmed.  A total of two complex treatment devices were fabricated in the form of the BodyFix immobilization pillow and a neck accuform cushion.  I have requested : 3D Simulation  I have requested a DVH of the following structures: Heart, Lungs, Esophagus, Chest Wall, Brachial Plexus, Major Blood Vessels, and targets.  SPECIAL TREATMENT PROCEDURE:  The planned course of therapy using radiation constitutes a special treatment procedure.  Special care is required in the management of this patient for the following reasons. This treatment constitutes a Special Treatment Procedure for the following reason: [ High dose per fraction requiring special monitoring for increased toxicities of treatment including daily imaging..  The special nature of the planned course of radiotherapy will require increased physician supervision and oversight to ensure patient's safety with optimal treatment outcomes.  RESPIRATORY MOTION MANAGEMENT SIMULATION:  In order to account for effect of respiratory motion on target structures and other organs in the planning and delivery of radiotherapy, this patient underwent respiratory motion management simulation.  To accomplish this, when the patient was brought to the CT simulation planning suite, 4D respiratoy motion management CT images were obtained.  The CT images were loaded into the planning software.  Then, using a variety of tools including Cine, MIP, and standard views, the target volume and planning target volumes (PTV) were delineated.  Avoidance structures were contoured.  Treatment planning then occurred.  Dose volume histograms were generated and reviewed for each of the requested structure.  The resulting plan was carefully reviewed and approved today.  PLAN:  The patient will receive 54 Gy in 3 fractions  ________________________________  Sheral Apley. Tammi Klippel, M.D.

## 2016-05-14 ENCOUNTER — Ambulatory Visit: Payer: Medicare HMO

## 2016-05-14 ENCOUNTER — Ambulatory Visit: Payer: Medicare HMO | Admitting: Family

## 2016-05-14 ENCOUNTER — Other Ambulatory Visit: Payer: Medicare HMO

## 2016-05-14 ENCOUNTER — Ambulatory Visit (HOSPITAL_BASED_OUTPATIENT_CLINIC_OR_DEPARTMENT_OTHER): Payer: Medicare HMO

## 2016-05-14 ENCOUNTER — Encounter: Payer: Self-pay | Admitting: Internal Medicine

## 2016-05-14 ENCOUNTER — Telehealth: Payer: Self-pay | Admitting: Internal Medicine

## 2016-05-14 ENCOUNTER — Other Ambulatory Visit: Payer: Self-pay | Admitting: Medical Oncology

## 2016-05-14 ENCOUNTER — Ambulatory Visit (HOSPITAL_BASED_OUTPATIENT_CLINIC_OR_DEPARTMENT_OTHER): Payer: Medicare HMO | Admitting: Internal Medicine

## 2016-05-14 VITALS — BP 149/50 | HR 69 | Temp 98.3°F | Resp 17 | Ht 62.0 in | Wt 173.3 lb

## 2016-05-14 DIAGNOSIS — J449 Chronic obstructive pulmonary disease, unspecified: Secondary | ICD-10-CM | POA: Diagnosis not present

## 2016-05-14 DIAGNOSIS — C3431 Malignant neoplasm of lower lobe, right bronchus or lung: Secondary | ICD-10-CM | POA: Diagnosis not present

## 2016-05-14 DIAGNOSIS — D649 Anemia, unspecified: Secondary | ICD-10-CM

## 2016-05-14 DIAGNOSIS — D539 Nutritional anemia, unspecified: Secondary | ICD-10-CM

## 2016-05-14 HISTORY — DX: Nutritional anemia, unspecified: D53.9

## 2016-05-14 LAB — COMPREHENSIVE METABOLIC PANEL
ALBUMIN: 3.6 g/dL (ref 3.5–5.0)
ALK PHOS: 92 U/L (ref 40–150)
ALT: 10 U/L (ref 0–55)
ANION GAP: 10 meq/L (ref 3–11)
AST: 16 U/L (ref 5–34)
BUN: 21.5 mg/dL (ref 7.0–26.0)
CO2: 23 meq/L (ref 22–29)
CREATININE: 1.2 mg/dL — AB (ref 0.6–1.1)
Calcium: 9.7 mg/dL (ref 8.4–10.4)
Chloride: 107 mEq/L (ref 98–109)
EGFR: 44 mL/min/{1.73_m2} — AB (ref >90)
Glucose: 109 mg/dl (ref 70–140)
Potassium: 4.1 mEq/L (ref 3.5–5.1)
Sodium: 141 mEq/L (ref 136–145)
TOTAL PROTEIN: 8 g/dL (ref 6.4–8.3)

## 2016-05-14 LAB — CBC & DIFF AND RETIC
BASO%: 0.3 % (ref 0.0–2.0)
Basophils Absolute: 0 10*3/uL (ref 0.0–0.1)
EOS ABS: 0.2 10*3/uL (ref 0.0–0.5)
EOS%: 2.6 % (ref 0.0–7.0)
HCT: 29.4 % — ABNORMAL LOW (ref 34.8–46.6)
HEMOGLOBIN: 9.1 g/dL — AB (ref 11.6–15.9)
IMMATURE RETIC FRACT: 19.4 % — AB (ref 1.60–10.00)
LYMPH#: 2.5 10*3/uL (ref 0.9–3.3)
LYMPH%: 27.2 % (ref 14.0–49.7)
MCH: 22.9 pg — ABNORMAL LOW (ref 25.1–34.0)
MCHC: 31 g/dL — ABNORMAL LOW (ref 31.5–36.0)
MCV: 74.1 fL — AB (ref 79.5–101.0)
MONO#: 0.5 10*3/uL (ref 0.1–0.9)
MONO%: 6 % (ref 0.0–14.0)
NEUT%: 63.9 % (ref 38.4–76.8)
NEUTROS ABS: 5.8 10*3/uL (ref 1.5–6.5)
Platelets: 296 10*3/uL (ref 145–400)
RBC: 3.97 10*6/uL (ref 3.70–5.45)
RDW: 18 % — ABNORMAL HIGH (ref 11.2–14.5)
RETIC %: 1.18 % (ref 0.70–2.10)
RETIC CT ABS: 46.85 10*3/uL (ref 33.70–90.70)
WBC: 9 10*3/uL (ref 3.9–10.3)

## 2016-05-14 LAB — LACTATE DEHYDROGENASE: LDH: 185 U/L (ref 125–245)

## 2016-05-14 NOTE — Progress Notes (Signed)
[facility] Telephone:([phone]) [phone]   [phone]  CONSULT NOTE  REFERRING PHYSICIAN: Dr. [name]  REASON FOR CONSULTATION:  75 years old white female with persistent anemia.  HPI [name] is a 75 y.o. female was past medical history significant for multiple medical problems including history of hypertension, dyslipidemia, coronary artery disease, COPD, GERD, peripheral total disease, carotid artery occlusion, Arnold Chiari malformation and myocardial infarction. She also has a long history of smoking and quit in 2002. The patient was also recently diagnosed with stage IA non-small cell lung cancer, squamous cell carcinoma presented with right lower lobe pulmonary nodule and she is expected to start the stereotactic radiotherapy to this nodule week under the care of Dr. Tammi Klippel. The patient has been complaining of increasing fatigue and weakness as well as dizzy spells recently she also has shortness of breath with exertion partially secondary to anemia and also congestive heart failure. She has a history of bleeding hemorrhoids but stool Hemoccult performed 2 months ago was negative for blood. The patient was seen by her primary care physician and CBC on 04/11/2016 showed hemoglobin of 7.8, hematocrit 26.3 with MCV of 76.7. Total white blood count and platelets count were normal. The patient was referred to me today for further evaluation and recommendation regarding her persistent anemia. When seen today she continues to complain of the fatigue and weakness as well as dizzy spells. She also has shortness breath with exertion. She denied having any significant nausea, vomiting, diarrhea or constipation. She has no chest pain, cough or hemoptysis. She denied having any significant weight loss or night sweats. Family history significant for mother died from heart attack and father died from blood clots. She had a maternal uncle as well as Brother diagnosed with  cancer. She is a widow and her husband died from lung cancer. She has 4 children and she was accompanied today by her daughter Dr. Dory Larsen. She has a history of smoking 1.5 pack per day for around 35 years and quit 2002. She has no history of alcohol or drug abuse.  HPI  Past Medical History:  Diagnosis Date  . 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 Arnold-Chiari malformation ([place]) 1998  . Asthma   . 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  . Carotid artery occlusion   . Complication of anesthesia   . COPD (chronic obstructive pulmonary disease) ([place])   . Coronary artery disease   . 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 Diverticulitis   . Gastroesophageal reflux disease   . Hiatal hernia   . Hyperlipidemia   . Hypertension   . Iliac artery aneurysm ([place])   . 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 Myocardial infarction 01/01/2000   Cardiac catheterization  . Peripheral vascular disease ([place])   . PONV (postoperative nausea and vomiting)    occassionally  . Reflux     Past Surgical History:  Procedure Laterality Date  . ABDOMINAL HYSTERECTOMY    . APPENDECTOMY    . Arnold-chiari malformation repair  1998   Suboccipital craniectomy  . CAROTID ENDARTERECTOMY  03/29/2010   Left  CEA  . CHOLECYSTECTOMY     Gall Bladder  . COLONOSCOPY WITH PROPOFOL N/A 04/22/2015   Procedure: COLONOSCOPY WITH PROPOFOL;  Surgeon: Carol Ada, MD;  Location: [facility];  Service: Endoscopy;  Laterality: N/A;  . CORNEAL TRANSPLANT     Right  . CORONARY ARTERY  BYPASS GRAFT  01/01/2000  . EYE SURGERY     Laser surgery for retinal hemorrhage  . LEFT HEART CATHETERIZATION WITH CORONARY ANGIOGRAM N/A 08/03/2014   Procedure: LEFT HEART CATHETERIZATION WITH CORONARY ANGIOGRAM;  Surgeon: Birdie Riddle, MD;  Location: [facility] CATH [facility];  Service: Cardiovascular;  Laterality: N/A;  . Post Coronary Artery  BPG  01/05/2000   Right jugular sheath removed   . PR VEIN BYPASS GRAFT,AORTO-FEM-POP    . ROTATOR CUFF REPAIR     Right    Family History  Problem Relation Age of Onset  . Heart disease Mother     Heart Disease before age 53  . Hypertension Mother   . Hyperlipidemia Mother   . Heart attack Mother   . Clotting disorder Mother   . Heart disease Father     Heart Disease before age 69  . Heart attack Father   . Hyperlipidemia Father   . Hypertension Father   . Heart disease Brother     Heart Disease before age 81  . Hyperlipidemia Brother   . Hypertension Brother   . Clotting disorder Brother   . AAA (abdominal aortic aneurysm) Brother   . Cerebral aneurysm Sister   . Hypertension Sister   . AAA (abdominal aortic aneurysm) Sister   . Asthma Sister   . Cerebral aneurysm Brother   . Cancer Brother     Lung  . Hypertension Brother   . Heart attack Brother   . Heart disease Brother     Aneurysm of Brain  . Hypertension Brother   . Heart disease Brother   . Heart disease Brother   . Stroke Son     Aneurysm of Stomach  . AAA (abdominal aortic aneurysm) Son   . Cancer Maternal Uncle     great uncle/cancer/type unknown    Social History Social History  Substance Use Topics  . Smoking status: Former Smoker    Packs/day: 1.50    Years: 30.00    Types: Cigarettes    Quit date: 08/13/2000  . Smokeless tobacco: Never Used  . Alcohol use No    Allergies  Allergen Reactions  . Hydromorphone Palpitations and Other (See Comments)    DILAUDID  -  Pt had a Heart Attack after taking Dilaudid.  . Levaquin [Levofloxacin] Other (See Comments)    Chest pressure, SOB, "pain in between shoulder blades", sweaty -as reported by patient per experience in ED this afternoon  . Codeine Other (See Comments)    Dr. Terrence Dupont advised patient not to take this medication  . Doxycycline Swelling    Mouth, lips, feet swelling  . Oxycodone-Acetaminophen Other (See Comments)    Says it makes her feel weird  . Risedronate Other (See Comments)     Chest pain  . Avelox [Moxifloxacin Hcl In Nacl] Palpitations    Current Outpatient Prescriptions  Medication Sig Dispense Refill  . albuterol (VENTOLIN HFA) 108 (90 BASE) MCG/ACT inhaler Inhale 2 puffs into the lungs every 4 (four) hours as needed for wheezing or shortness of breath (((PLAN B))). 1 Inhaler 11  . amLODipine-olmesartan (AZOR) 5-40 MG tablet Take 1 tablet by mouth daily.    Marland Kitchen aspirin EC 81 MG tablet Take 81 mg by mouth at bedtime.     . cetirizine (ZYRTEC) 1 MG/ML syrup Take 5 mg by mouth daily as needed (allergies).     . cloNIDine (CATAPRES) 0.1 MG tablet Take 0.05 mg by mouth 2 (two) times daily.     Marland Kitchen  clopidogrel (PLAVIX) 75 MG tablet Take 1 tablet (75 mg total) by mouth daily. 90 tablet 3  . dexlansoprazole (DEXILANT) 60 MG capsule Take 1 capsule (60 mg total) by mouth daily before breakfast. 30 capsule 11  . ferrous sulfate 325 (65 FE) MG tablet Take 325 mg by mouth daily with breakfast.    . ipratropium-albuterol (DUONEB) 0.5-2.5 (3) MG/3ML SOLN Take 3 mLs by nebulization 4 (four) times daily. 360 mL 0  . mometasone-formoterol (DULERA) 100-5 MCG/ACT AERO Inhale 2 puffs into the lungs 2 (two) times daily. 1 Inhaler 5  . prednisoLONE acetate (PRED FORTE) 1 % ophthalmic suspension Place 1 drop into the right eye every other day.     . rosuvastatin (CRESTOR) 10 MG tablet Take 10 mg by mouth at bedtime.     Marland Kitchen acetaminophen (TYLENOL) 500 MG tablet Take 500-1,000 mg by mouth every 6 (six) hours as needed for moderate pain or headache.     . nitroGLYCERIN (NITROSTAT) 0.4 MG SL tablet Place 0.4 mg under the tongue every 5 (five) minutes as needed for chest pain.      No current facility-administered medications for this visit.     Review of Systems  Constitutional: positive for fatigue Eyes: negative Ears, nose, mouth, throat, and face: negative Respiratory: positive for dyspnea on exertion Cardiovascular: negative Gastrointestinal:  negative Genitourinary:negative Integument/breast: negative Hematologic/lymphatic: negative Musculoskeletal:positive for muscle weakness Neurological: positive for dizziness Behavioral/Psych: negative Endocrine: negative Allergic/Immunologic: negative  Physical Exam  RXV:QMGQQ, healthy, no distress, well nourished and well developed SKIN: skin color, texture, turgor are normal, no rashes or significant lesions HEAD: Normocephalic, No masses, lesions, tenderness or abnormalities EYES: normal, PERRLA, Conjunctiva are pink and non-injected EARS: External ears normal, Canals clear OROPHARYNX:no exudate, no erythema and lips, buccal mucosa, and tongue normal  NECK: supple, no adenopathy, no JVD LYMPH:  no palpable lymphadenopathy, no hepatosplenomegaly BREAST:not examined LUNGS: clear to auscultation , and palpation HEART: regular rate & rhythm, no murmurs and no gallops ABDOMEN:abdomen soft, non-tender, normal bowel sounds and no masses or organomegaly BACK: Back symmetric, no curvature., No CVA tenderness EXTREMITIES:no joint deformities, effusion, or inflammation, no edema, no skin discoloration, no clubbing  NEURO: alert & oriented x 3 with fluent speech, no focal motor/sensory deficits  PERFORMANCE STATUS: ECOG 1  LABORATORY DATA: Lab Results  Component Value Date   WBC 6.2 04/11/2016   HGB 7.8 (L) 04/11/2016   HCT 26.3 (L) 04/11/2016   MCV 76.7 (L) 04/11/2016   PLT 285 04/11/2016      Chemistry      Component Value Date/Time   NA 137 02/24/2016 1320   K 3.7 02/24/2016 1320   CL 107 02/24/2016 1320   CO2 21 (L) 02/24/2016 1320   BUN 12 02/24/2016 1320   CREATININE 0.96 02/24/2016 1320      Component Value Date/Time   CALCIUM 9.1 02/24/2016 1320   ALKPHOS 70 02/24/2016 1320   AST 19 02/24/2016 1320   ALT 10 (L) 02/24/2016 1320   BILITOT 0.5 02/24/2016 1320       RADIOGRAPHIC STUDIES: Dg Chest 2 View  Result Date: 04/30/2016 CLINICAL DATA:  Worsening  shortness of breath. EXAM: CHEST  2 VIEW COMPARISON:  04/11/2016, 02/24/2016. PET-CT 03/14/2016. Chest CT 02/28/2016. FINDINGS: Mediastinum and hilar structures are normal. Nodular densities are noted in the right and left lung bases. Prior CABG. Heart size normal. No pleural effusion or pneumothorax. Degenerative changes thoracic spine. Surgical clips upper abdomen . IMPRESSION: Nodular densities are noted persistent in  the right and left lung bases. Reference is made to prior PET-CT report of 03/14/2016. Electronically Signed   By: Marcello Moores  Register   On: 04/30/2016 12:02    ASSESSMENT:This is a very pleasant 75 years old white female with: 1) stage IA non-small cell lung cancer, squamous cell carcinoma presented with right lower lobe pulmonary nodule. 2) persistent anemia questionable for anemia of chronic disease plus/minus iron deficiency.   PLAN: I had a lengthy discussion with the patient and her daughter today about her current disease status and treatment options. For the stage IA non-small cell lung cancer, the patient will undergo stereotactic radiotherapy under the care of Dr. Tammi Klippel next week. She will continue follow-up visit and imaging studies under his care. I will definitely consider her for treatment if she has any disease recurrence. For the persistent anemia, this is most likely anemia of chronic disease plus/minus iron deficiency anemia. I ordered several studies today for evaluation of her anemia including repeat CBC, complaints metabolic panel, LDH, iron study, ferritin, serum erythropoietin, serum folate, serum vitamin B 12 as well as serum protein electrophoreses. I will arrange for the patient to come back for follow-up visit in 2 weeks for evaluation and discussion of her lab results and further recommendation regarding his condition. I'll also consider the patient for bone marrow biopsy and aspirate of that is any concerning findings on her lab work. If her CBC today showed  persistent anemia with hemoglobin less than 8.0, I would consider the patient for 2 units of PRBCs transfusion. The patient and her daughter agreed to the current plan. She was advised to call immediately if she has any concerning symptoms in the interval. The patient voices understanding of current disease status and treatment options and is in agreement with the current care plan.  All questions were answered. The patient knows to call the clinic with any problems, questions or concerns. We can certainly see the patient much sooner if necessary.  Thank you so much for allowing me to participate in the care of [name]. I will continue to follow up the patient with you and assist in her care.  I spent 40 minutes counseling the patient face to face. The total time spent in the appointment was 60 minutes.  Disclaimer: This note was dictated with voice recognition software. Similar sounding words can inadvertently be transcribed and may not be corrected upon review.   Amoria Mclees K. May 14, 2016, 3:08 PM

## 2016-05-14 NOTE — Telephone Encounter (Signed)
GAVE PATIENT AVS REPORT AND APPOINTMENTS FOR October  °

## 2016-05-15 LAB — IRON AND TIBC
%SAT: 7 % — AB (ref 21–57)
IRON: 27 ug/dL — AB (ref 41–142)
TIBC: 406 ug/dL (ref 236–444)
UIBC: 379 ug/dL (ref 120–384)

## 2016-05-15 LAB — FOLATE: Folate: >20 ng/mL (ref >3.0)

## 2016-05-15 LAB — FERRITIN: FERRITIN: 8 ng/mL — AB (ref 9–269)

## 2016-05-15 LAB — VITAMIN B12: Vitamin B12: 546 pg/mL (ref 211–946)

## 2016-05-15 LAB — ERYTHROPOIETIN: Erythropoietin: 26.7 m[IU]/mL — ABNORMAL HIGH (ref 2.6–18.5)

## 2016-05-16 ENCOUNTER — Telehealth: Payer: Self-pay | Admitting: *Deleted

## 2016-05-16 ENCOUNTER — Telehealth: Payer: Self-pay | Admitting: Medical Oncology

## 2016-05-16 ENCOUNTER — Other Ambulatory Visit: Payer: Self-pay | Admitting: *Deleted

## 2016-05-16 LAB — PROTEIN ELECTROPHORESIS, SERUM, WITH REFLEX
A/G Ratio: 1.1 (ref 0.7–1.7)
ALBUMIN: 3.7 g/dL (ref 2.9–4.4)
ALPHA 1: 0.2 g/dL (ref 0.0–0.4)
ALPHA 2: 1 g/dL (ref 0.4–1.0)
Beta: 1.1 g/dL (ref 0.7–1.3)
GLOBULIN, TOTAL: 3.5 g/dL (ref 2.2–3.9)
Gamma Globulin: 1.1 g/dL (ref 0.4–1.8)
PDF SPE: 0
Total Protein: 7.2 g/dL (ref 6.0–8.5)

## 2016-05-16 NOTE — Telephone Encounter (Signed)
-----   Message from Ardeen Garland, RN sent at 05/16/2016  8:29 AM EDT ----- I called pt about her iron def anemia and integra . She got an OTC iron but read paper with it and did not take it because she read of interaction with caffeine and dexilant for acid reflux. She drinks a lot of caffeine.  The Valinda Hoar has not been sent yet. What does Stanton Kidney need to tell pt?  ----- Message ----- From: Curt Bears, MD Sent: 05/15/2016  12:14 PM To: Ardeen Garland, RN  She has iron def anemia. Will start Integra plus for now. I will send it to her Pharmacy. ----- Message ----- From: Interface, Lab In [facility] Sent: 05/14/2016   3:59 PM To: Curt Bears, MD

## 2016-05-16 NOTE — Telephone Encounter (Signed)
I called pt about her iron def anemia and integra . She got an OTC iron but read paper with it and did not take it because she read of interaction with caffeine and dexilant for acid reflux. She drinks a lot of caffeine. Note to [place].

## 2016-05-16 NOTE — Telephone Encounter (Signed)
Notified pt to take Integra at night and not to take with any caffeine. Pt advised she is still bleeding went to see Dr. Benson Norway and it's not her hemorrhoids, he set her up for colonoscopy on Oct 13 to further evaluate cause of bleeding.

## 2016-05-17 ENCOUNTER — Telehealth: Payer: Self-pay | Admitting: *Deleted

## 2016-05-17 DIAGNOSIS — D539 Nutritional anemia, unspecified: Secondary | ICD-10-CM

## 2016-05-17 DIAGNOSIS — Z510 Encounter for antineoplastic radiation therapy: Secondary | ICD-10-CM | POA: Diagnosis not present

## 2016-05-17 MED ORDER — INTEGRA PLUS PO CAPS: 1.0000 | ORAL_CAPSULE | Freq: Every day | ORAL | 1 refills | Status: DC

## 2016-05-17 NOTE — Telephone Encounter (Signed)
"  I've been to my pharmacy to get the iron and called today.  What pharmacy was the order sent to or could it have gotten lost in the computer air."  Confirmed pharmacy is [facility] at [address]  Will re-send at this time.

## 2016-05-18 ENCOUNTER — Encounter: Payer: Self-pay | Admitting: Radiation Oncology

## 2016-05-18 NOTE — Progress Notes (Signed)
Patient came by my office--brought in her income verification--qualifies for 400.00 Beulah grant--gave her a gas card

## 2016-05-21 ENCOUNTER — Ambulatory Visit
Admission: RE | Admit: 2016-05-21 | Discharge: 2016-05-21 | Disposition: A | Discharge status: Home / Self Care | Payer: Medicare HMO | Source: Ambulatory Visit | Attending: Radiation Oncology | Admitting: Radiation Oncology

## 2016-05-21 DIAGNOSIS — Z510 Encounter for antineoplastic radiation therapy: Secondary | ICD-10-CM | POA: Diagnosis not present

## 2016-05-22 ENCOUNTER — Ambulatory Visit: Payer: Medicare HMO | Admitting: Radiation Oncology

## 2016-05-23 ENCOUNTER — Ambulatory Visit
Admission: RE | Admit: 2016-05-23 | Discharge: 2016-05-23 | Disposition: A | Discharge status: Home / Self Care | Payer: Medicare HMO | Source: Ambulatory Visit | Attending: Radiation Oncology | Admitting: Radiation Oncology

## 2016-05-23 DIAGNOSIS — Z510 Encounter for antineoplastic radiation therapy: Secondary | ICD-10-CM | POA: Diagnosis not present

## 2016-05-24 ENCOUNTER — Encounter (HOSPITAL_COMMUNITY): Payer: Self-pay | Admitting: Anesthesiology

## 2016-05-24 ENCOUNTER — Ambulatory Visit: Payer: Medicare HMO | Admitting: Radiation Oncology

## 2016-05-24 ENCOUNTER — Encounter: Payer: Self-pay | Admitting: *Deleted

## 2016-05-24 NOTE — Progress Notes (Signed)
[place] Work  Clinical Social Work met with patient and patient's daughter in [place] office.  Patient familiar with CSW, patient's spouse passed away several years ago and was treated at cancer center.  Mrs. Alejos is most concerned regarding finances.  CSW unfamiliar with any financial resources patient may qualify for at this time.  CSW signed patient up for [employer] and encouraged her to meet with financial counselors as well.  Polo Riley, MSW, LCSW, OSW-C Clinical Social Worker [facility] [phone]

## 2016-05-24 NOTE — H&P (Signed)
[name] HPI: For the past month she reports issues with intermittent hematochezia. The bleeding is minimal and it turns the toilet water to a "pink Koolaid pink". She denies any issues with constipation or proctalgia. On 04/22/2015 the patient was identified to have 3 hepatic flexure adenomas and diverticula. The procedure was performed for her personal history of colonic polyps. She was recently diagnosed with Stage 1A nonsmall cell carcinoma and she is receiving XRT to the base of her RLL. Within the past couple of months she has developed an anemia and she continues with Plavix. Her HGB on 05/14/2016 was at 9.1 g/dL and on 04/11/2016 it was at 7.8 g/dL. She denies any recent blood transfusions.  Past Medical History:  Diagnosis Date  . 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 Arnold-Chiari malformation ([place]) 1998  . Asthma   . 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  . Carotid artery occlusion   . Complication of anesthesia   . COPD (chronic obstructive pulmonary disease) ([place])   . Coronary artery disease   . 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 Diverticulitis   . Gastroesophageal reflux disease   . Hiatal hernia   . Hyperlipidemia   . Hypertension   . Iliac artery aneurysm ([place])   . 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 Myocardial infarction 01/01/2000   Cardiac catheterization  . Peripheral vascular disease ([place])   . PONV (postoperative nausea and vomiting)    occassionally  . Reflux     Past Surgical History:  Procedure Laterality Date  . ABDOMINAL HYSTERECTOMY    . APPENDECTOMY    . Arnold-chiari malformation repair  1998   Suboccipital craniectomy  . CAROTID ENDARTERECTOMY  03/29/2010   Left  CEA  . CHOLECYSTECTOMY     Gall Bladder  . COLONOSCOPY WITH PROPOFOL N/A 04/22/2015   Procedure: COLONOSCOPY WITH PROPOFOL;  Surgeon: Carol Ada, MD;  Location: [facility];  Service: Endoscopy;  Laterality: N/A;  .  CORNEAL TRANSPLANT     Right  . CORONARY ARTERY BYPASS GRAFT  01/01/2000  . EYE SURGERY     Laser surgery for retinal hemorrhage  . LEFT HEART CATHETERIZATION WITH CORONARY ANGIOGRAM N/A 08/03/2014   Procedure: LEFT HEART CATHETERIZATION WITH CORONARY ANGIOGRAM;  Surgeon: Birdie Riddle, MD;  Location: [facility] CATH LAB;  Service: Cardiovascular;  Laterality: N/A;  . Post Coronary Artery  BPG  01/05/2000   Right jugular sheath removed  . PR VEIN BYPASS GRAFT,AORTO-FEM-POP    . ROTATOR CUFF REPAIR     Right    Family History  Problem Relation Age of Onset  . Heart disease Mother     Heart Disease before age 26  . Hypertension Mother   . Hyperlipidemia Mother   . Heart attack Mother   . Clotting disorder Mother   . Heart disease Father     Heart Disease before age 21  . Heart attack Father   . Hyperlipidemia Father   . Hypertension Father   . Heart disease Brother     Heart Disease before age 1  . Hyperlipidemia Brother   . Hypertension Brother   . Clotting disorder Brother   . AAA (abdominal aortic aneurysm) Brother   . Cerebral aneurysm Sister   . Hypertension Sister   . AAA (abdominal aortic aneurysm) Sister   . Asthma Sister   . Cerebral aneurysm Brother   . Cancer Brother  Lung  . Hypertension Brother   . Heart attack Brother   . Heart disease Brother     Aneurysm of Brain  . Hypertension Brother   . Heart disease Brother   . Heart disease Brother   . Stroke Son     Aneurysm of Stomach  . AAA (abdominal aortic aneurysm) Son   . Cancer Maternal Uncle     great uncle/cancer/type unknown    Social History:  reports that she quit smoking about 15 years ago. Her smoking use included Cigarettes. She has a 45.00 pack-year smoking history. She has never used smokeless tobacco. She reports that she does not drink alcohol or use drugs.  Allergies:  Allergies  Allergen Reactions  . Hydromorphone Palpitations and Other (See Comments)    DILAUDID  -  Pt had a Heart  Attack after taking Dilaudid.  . Levaquin [Levofloxacin] Other (See Comments)    Chest pressure, SOB, "pain in between shoulder blades", sweaty -as reported by patient per experience in ED this afternoon  . Codeine Other (See Comments)    Dr. Terrence Dupont advised patient not to take this medication  . Doxycycline Swelling    Mouth, lips, feet swelling  . Oxycodone-Acetaminophen Other (See Comments)    Says it makes her feel weird  . Risedronate Other (See Comments)    Chest pain  . Avelox [Moxifloxacin Hcl In Nacl] Palpitations    Medications: Scheduled: Continuous:  No results found for this or any previous visit (from the past 24 hour(s)).   No results found.  ROS:  As stated above in the HPI otherwise negative.  There were no vitals taken for this visit.    PE: Gen: NAD, Alert and Oriented HEENT:  [place]/AT, EOMI Neck: Supple, no LAD Lungs: CTA Bilaterally CV: RRR without M/G/R ABM: Soft, NTND, +BS Ext: No C/C/E  Assessment/Plan: 1) IDA - EGD/Colonoscopy.  [name] D 05/24/2016, 2:29 PM

## 2016-05-25 ENCOUNTER — Ambulatory Visit: Payer: Medicare HMO | Admitting: Radiation Oncology

## 2016-05-25 ENCOUNTER — Encounter (HOSPITAL_COMMUNITY): Payer: Self-pay

## 2016-05-25 ENCOUNTER — Encounter (HOSPITAL_COMMUNITY)
Admission: RE | Disposition: A | Discharge status: Home / Self Care | Payer: Self-pay | Source: Ambulatory Visit | Attending: Gastroenterology

## 2016-05-25 ENCOUNTER — Ambulatory Visit (HOSPITAL_COMMUNITY)
Admission: RE | Admit: 2016-05-25 | Discharge: 2016-05-25 | Disposition: A | Discharge status: Home / Self Care | Payer: Medicare HMO | Source: Ambulatory Visit | Attending: Gastroenterology | Admitting: Gastroenterology

## 2016-05-25 DIAGNOSIS — I2510 Atherosclerotic heart disease of native coronary artery without angina pectoris: Secondary | ICD-10-CM | POA: Insufficient documentation

## 2016-05-25 DIAGNOSIS — I10 Essential (primary) hypertension: Secondary | ICD-10-CM | POA: Insufficient documentation

## 2016-05-25 DIAGNOSIS — I739 Peripheral vascular disease, unspecified: Secondary | ICD-10-CM | POA: Insufficient documentation

## 2016-05-25 DIAGNOSIS — K921 Melena: Secondary | ICD-10-CM | POA: Insufficient documentation

## 2016-05-25 DIAGNOSIS — D509 Iron deficiency anemia, unspecified: Secondary | ICD-10-CM | POA: Insufficient documentation

## 2016-05-25 DIAGNOSIS — Z7951 Long term (current) use of inhaled steroids: Secondary | ICD-10-CM | POA: Insufficient documentation

## 2016-05-25 DIAGNOSIS — Z951 Presence of aortocoronary bypass graft: Secondary | ICD-10-CM | POA: Diagnosis not present

## 2016-05-25 DIAGNOSIS — Z7902 Long term (current) use of antithrombotics/antiplatelets: Secondary | ICD-10-CM | POA: Insufficient documentation

## 2016-05-25 DIAGNOSIS — Z79899 Other long term (current) drug therapy: Secondary | ICD-10-CM | POA: Diagnosis not present

## 2016-05-25 DIAGNOSIS — Z7982 Long term (current) use of aspirin: Secondary | ICD-10-CM | POA: Diagnosis not present

## 2016-05-25 DIAGNOSIS — E785 Hyperlipidemia, unspecified: Secondary | ICD-10-CM | POA: Diagnosis not present

## 2016-05-25 DIAGNOSIS — I252 Old myocardial infarction: Secondary | ICD-10-CM | POA: Diagnosis not present

## 2016-05-25 DIAGNOSIS — J449 Chronic obstructive pulmonary disease, unspecified: Secondary | ICD-10-CM | POA: Insufficient documentation

## 2016-05-25 DIAGNOSIS — Z85118 Personal history of other malignant neoplasm of bronchus and lung: Secondary | ICD-10-CM | POA: Diagnosis not present

## 2016-05-25 DIAGNOSIS — Z86010 Personal history of colonic polyps: Secondary | ICD-10-CM | POA: Diagnosis not present

## 2016-05-25 DIAGNOSIS — K219 Gastro-esophageal reflux disease without esophagitis: Secondary | ICD-10-CM | POA: Diagnosis not present

## 2016-05-25 DIAGNOSIS — Z87891 Personal history of nicotine dependence: Secondary | ICD-10-CM | POA: Insufficient documentation

## 2016-05-25 DIAGNOSIS — Z947 Corneal transplant status: Secondary | ICD-10-CM | POA: Diagnosis not present

## 2016-05-25 HISTORY — PX: COLONOSCOPY WITH PROPOFOL: SHX5780

## 2016-05-25 HISTORY — PX: ESOPHAGOGASTRODUODENOSCOPY: SHX5428

## 2016-05-25 SURGERY — COLONOSCOPY WITH PROPOFOL
Anesthesia: Moderate Sedation

## 2016-05-25 MED ORDER — LACTATED RINGERS IV SOLN
INTRAVENOUS | Status: DC
  Administered 2016-05-25: 1000 mL via INTRAVENOUS

## 2016-05-25 MED ORDER — FENTANYL CITRATE (PF) 100 MCG/2ML IJ SOLN
INTRAMUSCULAR | Status: DC | PRN
  Administered 2016-05-25 (×2): 12.5 ug via INTRAVENOUS
  Administered 2016-05-25 (×3): 25 ug via INTRAVENOUS

## 2016-05-25 MED ORDER — MIDAZOLAM HCL 5 MG/ML IJ SOLN
INTRAMUSCULAR | Status: AC
  Filled 2016-05-25: qty 2

## 2016-05-25 MED ORDER — MIDAZOLAM HCL 10 MG/2ML IJ SOLN
INTRAMUSCULAR | Status: DC | PRN
Start: 2016-05-25 — End: 2016-05-25
  Administered 2016-05-25: 2 mg via INTRAVENOUS
  Administered 2016-05-25: 1 mg via INTRAVENOUS
  Administered 2016-05-25 (×3): 2 mg via INTRAVENOUS

## 2016-05-25 MED ORDER — FENTANYL CITRATE (PF) 100 MCG/2ML IJ SOLN
INTRAMUSCULAR | Status: AC
  Filled 2016-05-25: qty 2

## 2016-05-25 SURGICAL SUPPLY — 22 items

## 2016-05-25 NOTE — Op Note (Signed)
[facility] Patient Name: [name] Procedure Date: 05/25/2016 MRN: [id number] Attending MD: Carol Ada , MD Date of Birth: Jun 14, 1941 CSN: [id number] Age: 75 Admit Type: Inpatient Procedure:                Colonoscopy Indications:              Iron deficiency anemia Providers:                Carol Ada, MD, Sarah Monday RN, RN, Elspeth Cho Tech., Technician Referring MD:              Medicines:                Midazolam 4 mg IV, Fentanyl 50 micrograms IV Complications:            No immediate complications. Estimated Blood Loss:     Estimated blood loss: none. Procedure:                Pre-Anesthesia Assessment:                           - Prior to the procedure, a History and Physical                            was performed, and patient medications and                            allergies were reviewed. The patient's tolerance of                            previous anesthesia was also reviewed. The risks                            and benefits of the procedure and the sedation                            options and risks were discussed with the patient.                            All questions were answered, and informed consent                            was obtained. Prior Anticoagulants: The patient has                            taken Plavix (clopidogrel), last dose was 5 days                            prior to procedure. ASA Grade Assessment: III - A                            patient with severe systemic disease. After  reviewing the risks and benefits, the patient was                            deemed in satisfactory condition to undergo the                            procedure.                           - Sedation was administered by an endoscopy nurse.                            The sedation level attained was moderate.                           After obtaining informed consent, the  colonoscope                            was passed under direct vision. Throughout the                            procedure, the patient's blood pressure, pulse, and                            oxygen saturations were monitored continuously. The                            EC-3490LI ([id number]) scope was introduced through                            the anus and advanced to the the cecum, identified                            by appendiceal orifice and ileocecal valve. The                            colonoscopy was performed without difficulty. The                            patient tolerated the procedure well. The quality                            of the bowel preparation was good. The ileocecal                            valve, appendiceal orifice, and rectum were                            photographed. Scope In: 8:50:04 AM Scope Out: 9:07:20 AM Scope Withdrawal Time: 0 hours 14 minutes 3 seconds  Total Procedure Duration: 0 hours 17 minutes 16 seconds  Findings:      The entire examined colon appeared normal. Impression:               - The entire examined colon is normal.                           -  No specimens collected. Moderate Sedation:      N/A- Per Anesthesia Care Recommendation:           - Patient has a contact number available for                            emergencies. The signs and symptoms of potential                            delayed complications were discussed with the                            patient. Return to normal activities tomorrow.                            Written discharge instructions were provided to the                            patient.                           - Resume previous diet.                           - Continue present medications.                           - Await pathology results.                           - No repeat colonoscopy due to age and the absence                            of advanced adenomas.                           -  Return to GI clinic in 6 weeks. Procedure Code(s):        --- Professional ---                           [phone], Colonoscopy, flexible; diagnostic, including                            collection of specimen(s) by brushing or washing,                            when performed (separate procedure) Diagnosis Code(s):        --- Professional ---                           D50.9, Iron deficiency anemia, unspecified CPT copyright 2016 [employer]. All rights reserved. The codes documented in this report are preliminary and upon coder review may  be revised to meet current compliance requirements. Carol Ada, MD Carol Ada, MD 05/25/2016 9:13:38 AM This report has been signed electronically. Number of Addenda: 0

## 2016-05-25 NOTE — Discharge Instructions (Signed)

## 2016-05-25 NOTE — Op Note (Signed)
[facility] Patient Name: [name] Procedure Date: 05/25/2016 MRN: [id number] Attending MD: Carol Ada , MD Date of Birth: 04-09-1941 CSN: [id number] Age: 75 Admit Type: Inpatient Procedure:                Upper GI endoscopy Indications:              Iron deficiency anemia Providers:                Carol Ada, MD, Sarah Monday RN, RN, Elspeth Cho Tech., Technician Referring MD:              Medicines:                Midazolam 4 mg IV, Fentanyl 50 micrograms IV Complications:            No immediate complications. Estimated Blood Loss:     Estimated blood loss: none. Procedure:                Pre-Anesthesia Assessment:                           - Prior to the procedure, a History and Physical                            was performed, and patient medications and                            allergies were reviewed. The patient's tolerance of                            previous anesthesia was also reviewed. The risks                            and benefits of the procedure and the sedation                            options and risks were discussed with the patient.                            All questions were answered, and informed consent                            was obtained. Prior Anticoagulants: The patient has                            taken Plavix (clopidogrel), last dose was 5 days                            prior to procedure. ASA Grade Assessment: III - A                            patient with severe systemic disease. After  reviewing the risks and benefits, the patient was                            deemed in satisfactory condition to undergo the                            procedure.                           After obtaining informed consent, the endoscope was                            passed under direct vision. Throughout the                            procedure, the patient's blood  pressure, pulse, and                            oxygen saturations were monitored continuously. The                            EC-3490LI ([id number]) scope was introduced through                            the mouth, and advanced to the third part of                            duodenum. The upper GI endoscopy was accomplished                            without difficulty. The patient tolerated the                            procedure well. Scope In: Scope Out: Findings:      The esophagus was normal.      The stomach was normal.      The examined duodenum was normal. Impression:               - Normal esophagus.                           - Normal stomach.                           - Normal examined duodenum.                           - No specimens collected. Moderate Sedation:      N/A- Per Anesthesia Care Recommendation:           - Patient has a contact number available for                            emergencies. The signs and symptoms of potential  delayed complications were discussed with the                            patient. Return to normal activities tomorrow.                            Written discharge instructions were provided to the                            patient.                           - Resume previous diet.                           - Continue present medications.                           - To visualize the small bowel, perform video                            capsule endoscopy at the next available appointment. Procedure Code(s):        --- Professional ---                           [phone], Esophagogastroduodenoscopy, flexible,                            transoral; diagnostic, including collection of                            specimen(s) by brushing or washing, when performed                            (separate procedure) Diagnosis Code(s):        --- Professional ---                           D50.9, Iron deficiency anemia,  unspecified CPT copyright 2016 [employer]. All rights reserved. The codes documented in this report are preliminary and upon coder review may  be revised to meet current compliance requirements. Carol Ada, MD Carol Ada, MD 05/25/2016 9:15:50 AM This report has been signed electronically. Number of Addenda: 0

## 2016-05-28 ENCOUNTER — Encounter: Payer: Self-pay | Admitting: Radiation Oncology

## 2016-05-28 ENCOUNTER — Ambulatory Visit: Payer: Medicare HMO | Admitting: Radiation Oncology

## 2016-05-28 ENCOUNTER — Ambulatory Visit
Admission: RE | Admit: 2016-05-28 | Discharge: 2016-05-28 | Disposition: A | Discharge status: Home / Self Care | Payer: Medicare HMO | Source: Ambulatory Visit | Attending: Radiation Oncology | Admitting: Radiation Oncology

## 2016-05-28 VITALS — BP 143/54 | HR 69 | Temp 98.0°F | Resp 18 | Wt 173.4 lb

## 2016-05-28 DIAGNOSIS — C3431 Malignant neoplasm of lower lobe, right bronchus or lung: Secondary | ICD-10-CM

## 2016-05-28 DIAGNOSIS — Z510 Encounter for antineoplastic radiation therapy: Secondary | ICD-10-CM | POA: Diagnosis not present

## 2016-05-28 NOTE — Progress Notes (Signed)
  Radiation Oncology         [phone] ________________________________  Name: PRINCETTA UPLINGER MRN: [id number]  Date: 05/28/2016  DOB: 04-14-1941  End of Treatment Note  Diagnosis:   75 y.o. 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  Volumetric arc fields were employed to deliver 6 MV X-rays.  Image guidance was performed with per fraction cone beam CT prior to treatment under personal MD supervision.  Immobilization was achieved using BodyFix Pillow.  Narrative: The patient tolerated radiation treatment relatively well.   No complications were noted.  She had some cough.  Plan: The patient has completed radiation treatment. The patient will return to radiation oncology clinic for routine followup in one month. I advised them to call or return sooner if they have any questions or concerns related to their recovery or treatment. ________________________________  Sheral Apley. Tammi Klippel, M.D.

## 2016-05-28 NOTE — Progress Notes (Signed)
  Radiation Oncology         [phone] ________________________________  Name: [name] MRN: [id number]  Date: 05/28/2016  DOB: 23-Mar-1941  Weekly Radiation Therapy Management    ICD-9-CM ICD-10-CM   1. Primary cancer of right lower lobe of lung (HCC) 162.5 C34.31     Current Dose: 54 Gy     Planned Dose:  54 Gy  Narrative . . . . . . . . The patient presents for the final under treatment assessment.                                            The patient has had some continuation of previously noted symptoms.                                 Set-up films were reviewed.                                 The chart was checked. Physical Findings. . . Weight essentially stable.  No significant changes. Impression . . . . . . . The patient tolerated radiation relatively well. Plan . . . . . . . . . . . . Complete radiation today as scheduled, and follow-up in one month. The patient was encouraged to call or return to the clinic in the interim for any worsening symptoms.  ________________________________  Sheral Apley Tammi Klippel, M.D.

## 2016-05-28 NOTE — Progress Notes (Signed)
Weight and vitals stable. Denies pain. Reports sore throat and pain associated with swallowing. Reports a persistent cough. Reports occasionally her cough is productive with white sputum and other time dry. Reports increased SOB. Denies hemoptysis. Denies skin changes within treatment field. Reports hoarseness. Reports fatigue. One month follow up appointment card given. Patient understands to contact this RN with future needs.  BP (!) 143/54 (BP Location: Left Arm, Patient Position: Sitting, Cuff Size: Normal)   Pulse 69   Temp 98 F (36.7 C) (Oral)   Resp 18   Wt 173 lb 6.4 oz (78.7 kg)   SpO2 99%   BMI 31.72 kg/m  Wt Readings from Last 3 Encounters:  05/28/16 173 lb 6.4 oz (78.7 kg)  05/25/16 173 lb (78.5 kg)  05/14/16 173 lb 4.8 oz (78.6 kg)

## 2016-05-29 ENCOUNTER — Ambulatory Visit: Payer: Medicare HMO | Admitting: Radiation Oncology

## 2016-05-30 ENCOUNTER — Ambulatory Visit: Payer: Medicare HMO | Admitting: Radiation Oncology

## 2016-05-31 ENCOUNTER — Ambulatory Visit: Payer: Medicare HMO | Admitting: Radiation Oncology

## 2016-06-01 ENCOUNTER — Ambulatory Visit: Payer: Medicare HMO | Admitting: Radiation Oncology

## 2016-06-04 ENCOUNTER — Emergency Department (HOSPITAL_COMMUNITY)
Admission: EM | Admit: 2016-06-04 | Discharge: 2016-06-04 | Disposition: A | Discharge status: Home / Self Care | Payer: Medicare HMO | Attending: Emergency Medicine | Admitting: Emergency Medicine

## 2016-06-04 ENCOUNTER — Emergency Department (HOSPITAL_COMMUNITY): Payer: Medicare HMO

## 2016-06-04 ENCOUNTER — Encounter (HOSPITAL_COMMUNITY): Payer: Self-pay | Admitting: Emergency Medicine

## 2016-06-04 DIAGNOSIS — Z7982 Long term (current) use of aspirin: Secondary | ICD-10-CM | POA: Diagnosis not present

## 2016-06-04 DIAGNOSIS — Z87891 Personal history of nicotine dependence: Secondary | ICD-10-CM | POA: Insufficient documentation

## 2016-06-04 DIAGNOSIS — J449 Chronic obstructive pulmonary disease, unspecified: Secondary | ICD-10-CM | POA: Insufficient documentation

## 2016-06-04 DIAGNOSIS — R0781 Pleurodynia: Secondary | ICD-10-CM | POA: Diagnosis not present

## 2016-06-04 DIAGNOSIS — Z79899 Other long term (current) drug therapy: Secondary | ICD-10-CM | POA: Diagnosis not present

## 2016-06-04 DIAGNOSIS — I252 Old myocardial infarction: Secondary | ICD-10-CM | POA: Diagnosis not present

## 2016-06-04 DIAGNOSIS — I10 Essential (primary) hypertension: Secondary | ICD-10-CM | POA: Insufficient documentation

## 2016-06-04 DIAGNOSIS — I2510 Atherosclerotic heart disease of native coronary artery without angina pectoris: Secondary | ICD-10-CM | POA: Insufficient documentation

## 2016-06-04 DIAGNOSIS — R059 Cough, unspecified: Secondary | ICD-10-CM

## 2016-06-04 DIAGNOSIS — R0602 Shortness of breath: Secondary | ICD-10-CM | POA: Diagnosis present

## 2016-06-04 DIAGNOSIS — R05 Cough: Secondary | ICD-10-CM

## 2016-06-04 DIAGNOSIS — J45909 Unspecified asthma, uncomplicated: Secondary | ICD-10-CM | POA: Diagnosis not present

## 2016-06-04 LAB — CBC WITH DIFFERENTIAL/PLATELET
BASOS ABS: 0 10*3/uL (ref 0.0–0.1)
Basophils Relative: 0 %
EOS ABS: 0.2 10*3/uL (ref 0.0–0.7)
EOS PCT: 3 %
HCT: 29.4 % — ABNORMAL LOW (ref 36.0–46.0)
Hemoglobin: 9.1 g/dL — ABNORMAL LOW (ref 12.0–15.0)
Lymphocytes Relative: 27 %
Lymphs Abs: 1.9 10*3/uL (ref 0.7–4.0)
MCH: 22.8 pg — AB (ref 26.0–34.0)
MCHC: 31 g/dL (ref 30.0–36.0)
MCV: 73.5 fL — ABNORMAL LOW (ref 78.0–100.0)
MONO ABS: 0.7 10*3/uL (ref 0.1–1.0)
Monocytes Relative: 10 %
Neutro Abs: 4.2 10*3/uL (ref 1.7–7.7)
Neutrophils Relative %: 60 %
PLATELETS: 296 10*3/uL (ref 150–400)
RBC: 4 MIL/uL (ref 3.87–5.11)
RDW: 17.8 % — AB (ref 11.5–15.5)
WBC: 7 10*3/uL (ref 4.0–10.5)

## 2016-06-04 LAB — BASIC METABOLIC PANEL
Anion gap: 6 (ref 5–15)
BUN: 16 mg/dL (ref 6–20)
CALCIUM: 9.6 mg/dL (ref 8.9–10.3)
CO2: 25 mmol/L (ref 22–32)
Chloride: 108 mmol/L (ref 101–111)
Creatinine, Ser: 1.3 mg/dL — ABNORMAL HIGH (ref 0.44–1.00)
GFR calc Af Amer: 46 mL/min — ABNORMAL LOW (ref >60)
GFR, EST NON AFRICAN AMERICAN: 40 mL/min — AB (ref >60)
GLUCOSE: 111 mg/dL — AB (ref 65–99)
POTASSIUM: 4.5 mmol/L (ref 3.5–5.1)
SODIUM: 139 mmol/L (ref 135–145)

## 2016-06-04 MED ORDER — IOPAMIDOL (ISOVUE-370) INJECTION 76%
100.0000 mL | Freq: Once | INTRAVENOUS | Status: AC | PRN
  Administered 2016-06-04: 80 mL via INTRAVENOUS

## 2016-06-04 MED ORDER — ONDANSETRON HCL 4 MG/2ML IJ SOLN
4.0000 mg | Freq: Once | INTRAMUSCULAR | Status: AC
  Administered 2016-06-04: 4 mg via INTRAVENOUS
  Filled 2016-06-04: qty 2

## 2016-06-04 MED ORDER — SODIUM CHLORIDE 0.9 % IV BOLUS (SEPSIS)
500.0000 mL | Freq: Once | INTRAVENOUS | Status: AC
  Administered 2016-06-04: 500 mL via INTRAVENOUS

## 2016-06-04 NOTE — ED Triage Notes (Signed)
Patient states that she is been having chills, cough with productive yellow phlegm, rib cage pain since last week.  Patient states that she had PNA recently and is currently getting radiation with last treatment was last Monday.

## 2016-06-04 NOTE — ED Notes (Signed)
Patient transported to X-ray 

## 2016-06-04 NOTE — ED Notes (Signed)
Patient transported to CT 

## 2016-06-04 NOTE — ED Notes (Signed)
MD at bedside. 

## 2016-06-04 NOTE — ED Provider Notes (Signed)
[facility] DEPT Provider Note   CSN: [id number] Arrival date & time: 06/04/16  4332     History   Chief Complaint Chief Complaint  Patient presents with  . Cough  . rib cage pain  . Shortness of Breath    HPI [name] is a 75 y.o. female.  The history is provided by the patient.  Cough  This is a new problem. The current episode started more than 1 week ago. The problem occurs constantly. The problem has been gradually worsening. The cough is productive of sputum (now nonproductive). There has been no fever. Associated symptoms include shortness of breath (chronic). She has tried nothing for the symptoms. Past medical history comments: lung ca being treated with radiation.  Shortness of Breath  Associated symptoms include cough.    Past Medical History:  Diagnosis Date  . 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 Asthma   . 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  . Carotid artery occlusion   . Complication of anesthesia   . COPD (chronic obstructive pulmonary disease) ([place])   . Coronary artery disease   . 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 Diverticulitis   . Gastroesophageal reflux disease   . Hiatal hernia   . Hyperlipidemia   . Hypertension   . Iliac artery aneurysm ([place])   . 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 Myocardial infarction 01/01/2000   Cardiac catheterization  . Peripheral vascular disease ([place])   . PONV (postoperative nausea and vomiting)    occassionally  . Reflux     Patient Active Problem List   Diagnosis Date Noted  . 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 Primary cancer of right lower lobe of lung ([place]) 04/25/2016  . 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 Foot pain, right 10/24/2015  . CAP (community acquired pneumonia) 10/22/2015  . Prolonged QT interval 10/22/2015  . UTI (urinary tract infection) 10/22/2015  . COPD exacerbation ([place])  10/22/2015  . Neck pain on right side 01/05/2015  . Chest pain 08/02/2014  . Intractable headache 08/02/2014  . HLD (hyperlipidemia) 08/02/2014  . Essential hypertension 08/02/2014  . GERD (gastroesophageal reflux disease) 08/02/2014  . Leg pain, right 08/02/2014  . HA (headache)   . Carotid artery stenosis 12/09/2013  . 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  . Dehydration 04/02/2013  . CAD (coronary artery disease) 04/02/2013  . AKI (acute kidney injury) ([place]) 04/01/2013  . Nausea & vomiting 04/01/2013  . Diarrhea 04/01/2013  . Hypokalemia 04/01/2013  . Hyponatremia 04/01/2013  . Peripheral vascular disease ([place]) 01/07/2013  . Occlusion and stenosis of carotid artery without mention of cerebral infarction 01/07/2013  . Dizziness and giddiness 01/07/2013  . Shortness of breath 01/07/2013  . Cough 11/17/2012  . COPD GOLD II 11/17/2012    Past Surgical History:  Procedure Laterality Date  . ABDOMINAL HYSTERECTOMY    . APPENDECTOMY    . Arnold-chiari malformation repair  1998   Suboccipital craniectomy  . CAROTID ENDARTERECTOMY  03/29/2010   Left  CEA  . CHOLECYSTECTOMY     Gall Bladder  . COLONOSCOPY WITH PROPOFOL N/A 04/22/2015   Procedure: COLONOSCOPY WITH PROPOFOL;  Surgeon: Carol Ada, MD;  Location: [facility] END[facility]COPY;  Service: Endoscopy;  Laterality: N/A;  . COLONOSCOPY WITH PROPOFOL N/A 05/25/2016   Procedure: COLONOSCOPY WITH PROPOFOL;  Surgeon: Carol Ada, MD;  Location: [facility] ENDOSCOPY;  Service: Endoscopy;  Laterality: N/A;  .  CORNEAL TRANSPLANT     Right  . CORONARY ARTERY BYPASS GRAFT  01/01/2000  . ESOPHAGOGASTRODUODENOSCOPY N/A 05/25/2016   Procedure: ESOPHAGOGASTRODUODENOSCOPY (EGD);  Surgeon: Carol Ada, MD;  Location: [facility];  Service: Endoscopy;  Laterality: N/A;  . EYE SURGERY     Laser surgery for retinal hemorrhage  . LEFT HEART CATHETERIZATION WITH CORONARY ANGIOGRAM N/A 08/03/2014   Procedure: LEFT HEART CATHETERIZATION WITH CORONARY ANGIOGRAM;   Surgeon: Birdie Riddle, MD;  Location: [facility];  Service: Cardiovascular;  Laterality: N/A;  . Post Coronary Artery  BPG  01/05/2000   Right jugular sheath removed  . PR VEIN BYPASS GRAFT,AORTO-FEM-POP    . ROTATOR CUFF REPAIR     Right    OB History    No data available       Home Medications    Prior to Admission medications   Medication Sig Start Date End Date Taking? Authorizing Provider  albuterol (VENTOLIN HFA) 108 (90 BASE) MCG/ACT inhaler Inhale 2 puffs into the lungs every 4 (four) hours as needed for wheezing or shortness of breath (((PLAN B))). 08/09/13  Yes Pollie Friar, MD  amLODipine-olmesartan (AZOR) 5-40 MG tablet Take 1 tablet by mouth daily.   Yes Historical Provider, MD  aspirin EC 81 MG tablet Take 81 mg by mouth daily.    Yes Historical Provider, MD  cloNIDine (CATAPRES) 0.1 MG tablet Take 0.05 mg by mouth 2 (two) times daily.    Yes Historical Provider, MD  clopidogrel (PLAVIX) 75 MG tablet Take 1 tablet (75 mg total) by mouth daily. 04/02/12  Yes Conrad Inverness, MD  dexlansoprazole (DEXILANT) 60 MG capsule Take 1 capsule (60 mg total) by mouth daily before breakfast. 11/17/12  Yes Tanda Rockers, MD  ipratropium-albuterol (DUONEB) 0.5-2.5 (3) MG/3ML SOLN Take 3 mLs by nebulization 4 (four) times daily. 10/24/15  Yes Janece Canterbury, MD  mometasone-formoterol (DULERA) 100-5 MCG/ACT AERO Inhale 2 puffs into the lungs 2 (two) times daily. 04/30/16  Yes Tammy S Parrett, NP  prednisoLONE acetate (PRED FORTE) 1 % ophthalmic suspension Place 1 drop into the right eye every other day.  02/26/14  Yes Historical Provider, MD  rosuvastatin (CRESTOR) 10 MG tablet Take 10 mg by mouth at bedtime.    Yes Historical Provider, MD  FeFum-FePoly-FA-B Cmp-C-Biot (INTEGRA PLUS) CAPS Take 1 capsule by mouth daily. Patient not taking: Reported on 06/04/2016 05/17/16   Curt Bears, MD  nitroGLYCERIN (NITROSTAT) 0.4 MG SL tablet Place 0.4 mg under the tongue every 5 (five) minutes as  needed for chest pain.     Historical Provider, MD    Family History Family History  Problem Relation Age of Onset  . Heart disease Mother     Heart Disease before age 79  . Hypertension Mother   . Hyperlipidemia Mother   . Heart attack Mother   . Clotting disorder Mother   . Heart disease Father     Heart Disease before age 20  . Heart attack Father   . Hyperlipidemia Father   . Hypertension Father   . Heart disease Brother     Heart Disease before age 35  . Hyperlipidemia Brother   . Hypertension Brother   . Clotting disorder Brother   . AAA (abdominal aortic aneurysm) Brother   . Cerebral aneurysm Sister   . Hypertension Sister   . AAA (abdominal aortic aneurysm) Sister   . Asthma Sister   . Cerebral aneurysm Brother   . Cancer Brother  Lung  . Hypertension Brother   . Heart attack Brother   . Heart disease Brother     Aneurysm of Brain  . Hypertension Brother   . Heart disease Brother   . Heart disease Brother   . Stroke Son     Aneurysm of Stomach  . AAA (abdominal aortic aneurysm) Son   . Cancer Maternal Uncle     great uncle/cancer/type unknown    Social History Social History  Substance Use Topics  . Smoking status: Former Smoker    Packs/day: 1.50    Years: 30.00    Types: Cigarettes    Quit date: 08/13/2000  . Smokeless tobacco: Never Used  . Alcohol use No     Allergies   Hydromorphone; Levaquin [levofloxacin]; Codeine; Doxycycline; Oxycodone-acetaminophen; Risedronate; and Avelox [moxifloxacin hcl in nacl]   Review of Systems Review of Systems  Respiratory: Positive for cough and shortness of breath (chronic).   All other systems reviewed and are negative.    Physical Exam Updated Vital Signs BP (!) 131/52   Pulse (!) 59   Temp 97.8 F (36.6 C) (Oral)   Resp 22   SpO2 98%   Physical Exam  Constitutional: She is oriented to person, place, and time. She appears well-developed and well-nourished. No distress.  HENT:  Head:  Normocephalic.  Nose: Nose normal.  Eyes: Conjunctivae are normal.  Neck: Neck supple. No tracheal deviation present.  Cardiovascular: Normal rate, regular rhythm and normal heart sounds.   Pulmonary/Chest: Effort normal and breath sounds normal. No respiratory distress. She has no wheezes. She has no rales. She exhibits no tenderness.  Abdominal: Soft. She exhibits no distension. There is no tenderness.  Neurological: She is alert and oriented to person, place, and time.  Skin: Skin is warm and dry.  Psychiatric: She has a normal mood and affect.  Vitals reviewed.    ED Treatments / Results  Labs (all labs ordered are listed, but only abnormal results are displayed) Labs Reviewed  CBC WITH DIFFERENTIAL/PLATELET - Abnormal; Notable for the following:       Result Value   Hemoglobin 9.1 (*)    HCT 29.4 (*)    MCV 73.5 (*)    MCH 22.8 (*)    RDW 17.8 (*)    All other components within normal limits  BASIC METABOLIC PANEL - Abnormal; Notable for the following:    Glucose, Bld 111 (*)    Creatinine, Ser 1.30 (*)    GFR calc non Af Amer 40 (*)    GFR calc Af Amer 46 (*)    All other components within normal limits    EKG  EKG Interpretation  Date/Time:  Monday June 04 2016 08:26:53 EDT Ventricular Rate:  74 PR Interval:    QRS Duration: 88 QT Interval:  368 QTC Calculation: 409 R Axis:   54 Text Interpretation:  Sinus rhythm No significant change since last tracing Confirmed by Zakiah Beckerman MD, Ignacia Gentzler [phone]) on 06/04/2016 9:20:52 AM       Radiology Dg Chest 2 View  Result Date: 06/04/2016 CLINICAL DATA:  Productive cough for 4 days. Right chest pain. Right lung squamous cell carcinoma. EXAM: CHEST  2 VIEW COMPARISON:  04/30/2016 and PET-CT on 03/14/2016 FINDINGS: The heart size and mediastinal contours are within normal limits. Prior CABG again noted. 1.4 cm right lower lobe pulmonary nodule is again seen, consistent with known primary squamous cell carcinoma. No evidence  of acute infiltrate, pleural effusion, or pneumothorax. IMPRESSION: Right lower lobe  pulmonary nodule, consistent with known primary squamous cell carcinoma. No new findings. Electronically Signed   By: Earle Gell M.D.   On: 06/04/2016 09:08   Ct Angio Chest Pe W And/or Wo Contrast  Result Date: 06/04/2016 CLINICAL DATA:  Productive cough and right-sided chest pain EXAM: CT ANGIOGRAPHY CHEST WITH CONTRAST TECHNIQUE: Multidetector CT imaging of the chest was performed using the standard protocol during bolus administration of intravenous contrast. Multiplanar CT image reconstructions and MIPs were obtained to evaluate the vascular anatomy. CONTRAST:  80 mL Omnipaque 350. COMPARISON:  Chest x-ray from earlier in the same day, 02/28/2016 FINDINGS: Cardiovascular: Diffuse aortic calcifications are noted. Changes of coronary bypass grafting are seen. No aneurysmal dilatation or dissection is noted. Heavy coronary calcifications are seen. Cardiac chambers are within normal limits. The pulmonary artery as visualized shows a normal branching pattern without intraluminal filling defect to suggest pulmonary embolism. Mediastinum/Nodes: The thoracic inlet is within normal limits. No significant hilar or mediastinal adenopathy is noted. The esophagus as visualized is air-filled with some mild wall thickening distally although this may be related to incomplete distension. Clinical correlation is recommended. Lungs/Pleura: The lungs are well aerated bilaterally. A right lower lobe mildly spiculated nodule is again identified consistent with the patient's given clinical history. It measures approximately 11 mm and is roughly stable. Lingular nodule it is improved in size and likely post inflammatory in nature. Nodular changes along the minor fissure are again noted on the right stable from the prior study. No new focal abnormality is seen. Upper Abdomen: Within normal limits. Musculoskeletal: Degenerative changes of the  thoracic spine are noted. No acute bony abnormality is seen. Review of the MIP images confirms the above findings. IMPRESSION: Stable right lower lobe spiculated nodule consistent with the given clinical history. No evidence of pulmonary emboli. Chronic changes in the lungs bilaterally. Thickening of the distal esophagus. No abnormality was noted on prior PET-CT in this likely represents some changes related to reflux or incomplete distension. Electronically Signed   By: [name] M.D.   On: 06/04/2016 10:40    Procedures Procedures (including critical care time)  Medications Ordered in ED Medications  ondansetron (ZOFRAN) injection 4 mg (4 mg Intravenous Given 06/04/16 1003)  sodium chloride 0.9 % bolus 500 mL (0 mLs Intravenous Stopped 06/04/16 1036)  iopamidol (ISOVUE-370) 76 % injection 100 mL (80 mLs Intravenous Contrast Given 06/04/16 1016)     Initial Impression / Assessment and Plan / ED Course  I have reviewed the triage vital signs and the nursing notes.  Pertinent labs & imaging results that were available during my care of the patient were reviewed by me and considered in my medical decision making (see chart for details).  Clinical Course    75 y.o. female presents with right sided chest pain with deep inspiration and cough that appears pleuritic. Currently being treated for spiculated lesion in lung with radiation. No other risk factors for PE but CT ordered d/t moderate risk and advanced age. Highly atypical for ACS with normal EKG. Lesion appears unchanged, no PE, recommended supportive care measures. Plan to follow up with PCP as needed and return precautions discussed for worsening or new concerning symptoms.   Final Clinical Impressions(s) / ED Diagnoses   Final diagnoses:  Cough  Pleuritic chest pain    New Prescriptions Discharge Medication List as of 06/04/2016 10:58 AM       Leo Grosser, MD 06/04/16 1710

## 2016-06-04 NOTE — ED Notes (Signed)
Unable to collect labs at this time patient is not in the room 

## 2016-06-07 ENCOUNTER — Telehealth: Payer: Self-pay | Admitting: Internal Medicine

## 2016-06-07 ENCOUNTER — Other Ambulatory Visit (HOSPITAL_BASED_OUTPATIENT_CLINIC_OR_DEPARTMENT_OTHER): Payer: Medicare HMO

## 2016-06-07 ENCOUNTER — Encounter: Payer: Self-pay | Admitting: Internal Medicine

## 2016-06-07 ENCOUNTER — Ambulatory Visit (HOSPITAL_BASED_OUTPATIENT_CLINIC_OR_DEPARTMENT_OTHER): Payer: Medicare HMO | Admitting: Internal Medicine

## 2016-06-07 ENCOUNTER — Telehealth: Payer: Self-pay | Admitting: *Deleted

## 2016-06-07 VITALS — BP 128/45 | HR 67 | Temp 98.3°F | Resp 17 | Ht 62.0 in | Wt 172.4 lb

## 2016-06-07 DIAGNOSIS — D509 Iron deficiency anemia, unspecified: Secondary | ICD-10-CM | POA: Diagnosis not present

## 2016-06-07 DIAGNOSIS — C3431 Malignant neoplasm of lower lobe, right bronchus or lung: Secondary | ICD-10-CM

## 2016-06-07 DIAGNOSIS — J209 Acute bronchitis, unspecified: Secondary | ICD-10-CM | POA: Diagnosis not present

## 2016-06-07 DIAGNOSIS — D539 Nutritional anemia, unspecified: Secondary | ICD-10-CM

## 2016-06-07 DIAGNOSIS — D508 Other iron deficiency anemias: Secondary | ICD-10-CM

## 2016-06-07 LAB — CBC WITH DIFFERENTIAL/PLATELET
BASO%: 0.3 % (ref 0.0–2.0)
BASOS ABS: 0 10*3/uL (ref 0.0–0.1)
EOS%: 2.7 % (ref 0.0–7.0)
Eosinophils Absolute: 0.3 10*3/uL (ref 0.0–0.5)
HCT: 28.3 % — ABNORMAL LOW (ref 34.8–46.6)
HGB: 8.7 g/dL — ABNORMAL LOW (ref 11.6–15.9)
LYMPH%: 21.1 % (ref 14.0–49.7)
MCH: 23.1 pg — AB (ref 25.1–34.0)
MCHC: 30.7 g/dL — AB (ref 31.5–36.0)
MCV: 75.1 fL — ABNORMAL LOW (ref 79.5–101.0)
MONO#: 1.1 10*3/uL — ABNORMAL HIGH (ref 0.1–0.9)
MONO%: 10.9 % (ref 0.0–14.0)
NEUT#: 6.3 10*3/uL (ref 1.5–6.5)
NEUT%: 65 % (ref 38.4–76.8)
Platelets: 256 10*3/uL (ref 145–400)
RBC: 3.77 10*6/uL (ref 3.70–5.45)
RDW: 18.3 % — ABNORMAL HIGH (ref 11.2–14.5)
WBC: 9.6 10*3/uL (ref 3.9–10.3)
lymph#: 2 10*3/uL (ref 0.9–3.3)

## 2016-06-07 MED ORDER — AMOXICILLIN-POT CLAVULANATE 875-125 MG PO TABS: 1.0000 | ORAL_TABLET | Freq: Two times a day (BID) | ORAL | 0 refills | Status: DC

## 2016-06-07 NOTE — Progress Notes (Signed)
[place] [phone]   [phone]  OFFICE PROGRESS NOTE  [name], MD [address] [place] [address]  DIAGNOSIS:  1) stage IA non-small cell lung cancer, squamous cell carcinoma presented with right lower lobe pulmonary nodule. 2) persistent anemia questionable for anemia of chronic disease plus/minus iron deficiency.  PRIOR THERAPY: None.  CURRENT THERAPY: Stereotactic radiotherapy under the care of Dr. Tammi Klippel.  INTERVAL HISTORY: [name] 75 y.o. female returns to the clinic today for follow-up visit accompanied by her daughter the patient is feeling fine today except for persistent cough. She was seen recently at the emergency department and given no medications. Her cough is productive of yellowish sputum. She denied having any current fever or chills. She has shortness of breath with exertion and chest pain secondary to the cough. The patient denied having any significant weight loss or night sweats. She completed the stereotactic radiotherapy under the care of Dr. Tammi Klippel last week. She had several studies for evaluation of her anemia and the findings were consistent with iron deficiency anemia. She was given prescription for Integra plus but did not start it because of concern about intolerance to the oral iron. She is here today for evaluation and discussion of her treatment options.  MEDICAL HISTORY: Past Medical History:  Diagnosis Date  . 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 Asthma   . 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  . Carotid artery occlusion   . Complication of anesthesia   . COPD (chronic obstructive pulmonary disease) ([place])   . Coronary artery disease   . 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 Diverticulitis   . Gastroesophageal reflux disease   . Hiatal hernia   .  Hyperlipidemia   . Hypertension   . Iliac artery aneurysm ([place])   . 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 Myocardial infarction 01/01/2000   Cardiac catheterization  . Peripheral vascular disease ([place])   . PONV (postoperative nausea and vomiting)    occassionally  . Reflux     ALLERGIES:  is allergic to hydromorphone; levaquin [levofloxacin]; codeine; doxycycline; oxycodone-acetaminophen; risedronate; and avelox [moxifloxacin hcl in nacl].  MEDICATIONS:  Current Outpatient Prescriptions  Medication Sig Dispense Refill  . albuterol (VENTOLIN HFA) 108 (90 BASE) MCG/ACT inhaler Inhale 2 puffs into the lungs every 4 (four) hours as needed for wheezing or shortness of breath (((PLAN B))). 1 Inhaler 11  . amLODipine-olmesartan (AZOR) 5-40 MG tablet Take 1 tablet by mouth daily.    Marland Kitchen aspirin EC 81 MG tablet Take 81 mg by mouth daily.     . cloNIDine (CATAPRES) 0.1 MG tablet Take 0.05 mg by mouth 2 (two) times daily.     . clopidogrel (PLAVIX) 75 MG tablet Take 1 tablet (75 mg total) by mouth daily. 90 tablet 3  . dexlansoprazole (DEXILANT) 60 MG capsule Take 1 capsule (60 mg total) by mouth daily before breakfast. 30 capsule 11  . FeFum-FePoly-FA-B Cmp-C-Biot (INTEGRA PLUS) CAPS Take 1 capsule by mouth daily. (Patient not taking: Reported on 06/04/2016) 30 capsule 1  . ipratropium-albuterol (DUONEB) 0.5-2.5 (3) MG/3ML SOLN Take 3 mLs by nebulization 4 (four) times daily. 360 mL 0  . mometasone-formoterol (DULERA) 100-5 MCG/ACT AERO Inhale 2 puffs into the lungs 2 (two) times daily. 1 Inhaler 5  . nitroGLYCERIN (NITROSTAT) 0.4  MG SL tablet Place 0.4 mg under the tongue every 5 (five) minutes as needed for chest pain.     . prednisoLONE acetate (PRED FORTE) 1 % ophthalmic suspension Place 1 drop into the right eye every other day.     . rosuvastatin (CRESTOR) 10 MG tablet Take 10 mg by mouth at bedtime.      No current facility-administered medications for this visit.      SURGICAL HISTORY:  Past Surgical History:  Procedure Laterality Date  . ABDOMINAL HYSTERECTOMY    . APPENDECTOMY    . Arnold-chiari malformation repair  1998   Suboccipital craniectomy  . CAROTID ENDARTERECTOMY  03/29/2010   Left  CEA  . CHOLECYSTECTOMY     Gall Bladder  . COLONOSCOPY WITH PROPOFOL N/A 04/22/2015   Procedure: COLONOSCOPY WITH PROPOFOL;  Surgeon: Carol Ada, MD;  Location: [facility];  Service: Endoscopy;  Laterality: N/A;  . COLONOSCOPY WITH PROPOFOL N/A 05/25/2016   Procedure: COLONOSCOPY WITH PROPOFOL;  Surgeon: Carol Ada, MD;  Location: [facility];  Service: Endoscopy;  Laterality: N/A;  . CORNEAL TRANSPLANT     Right  . CORONARY ARTERY BYPASS GRAFT  01/01/2000  . ESOPHAGOGASTRODUODENOSCOPY N/A 05/25/2016   Procedure: ESOPHAGOGASTRODUODENOSCOPY (EGD);  Surgeon: Carol Ada, MD;  Location: [facility];  Service: Endoscopy;  Laterality: N/A;  . EYE SURGERY     Laser surgery for retinal hemorrhage  . LEFT HEART CATHETERIZATION WITH CORONARY ANGIOGRAM N/A 08/03/2014   Procedure: LEFT HEART CATHETERIZATION WITH CORONARY ANGIOGRAM;  Surgeon: Birdie Riddle, MD;  Location: [facility];  Service: Cardiovascular;  Laterality: N/A;  . Post Coronary Artery  BPG  01/05/2000   Right jugular sheath removed  . PR VEIN BYPASS GRAFT,AORTO-FEM-POP    . ROTATOR CUFF REPAIR     Right    REVIEW OF SYSTEMS:  A comprehensive review of systems was negative except for: Constitutional: positive for fatigue Respiratory: positive for cough, dyspnea on exertion and pleurisy/chest pain   PHYSICAL EXAMINATION: General appearance: alert, cooperative, fatigued and no distress Head: Normocephalic, without obvious abnormality, atraumatic Neck: no adenopathy, no JVD, supple, symmetrical, trachea midline and thyroid not enlarged, symmetric, no tenderness/mass/nodules Lymph nodes: Cervical, supraclavicular, and axillary nodes normal. Resp: wheezes bilaterally Back: symmetric, no  curvature. ROM normal. No CVA tenderness. Cardio: regular rate and rhythm, S1, S2 normal, no murmur, click, rub or gallop GI: soft, non-tender; bowel sounds normal; no masses,  no organomegaly Extremities: extremities normal, atraumatic, no cyanosis or edema  ECOG PERFORMANCE STATUS: 1 - Symptomatic but completely ambulatory  Blood pressure (!) 128/45, pulse 67, temperature 98.3 F (36.8 C), temperature source Oral, resp. rate 17, height '5\' 2"'$  (1.575 m), weight 172 lb 6.4 oz (78.2 kg), SpO2 97 %.  LABORATORY DATA: Lab Results  Component Value Date   WBC 9.6 06/07/2016   HGB 8.7 (L) 06/07/2016   HCT 28.3 (L) 06/07/2016   MCV 75.1 (L) 06/07/2016   PLT 256 06/07/2016      Chemistry      Component Value Date/Time   NA 139 06/04/2016 0900   NA 141 05/14/2016 1541   K 4.5 06/04/2016 0900   K 4.1 05/14/2016 1541   CL 108 06/04/2016 0900   CO2 25 06/04/2016 0900   CO2 23 05/14/2016 1541   BUN 16 06/04/2016 0900   BUN 21.5 05/14/2016 1541   CREATININE 1.30 (H) 06/04/2016 0900   CREATININE 1.2 (H) 05/14/2016 1541      Component Value Date/Time   CALCIUM 9.6 06/04/2016  0900   CALCIUM 9.7 05/14/2016 1541   ALKPHOS 92 05/14/2016 1541   AST 16 05/14/2016 1541   ALT 10 05/14/2016 1541   BILITOT <0.30 05/14/2016 1541       RADIOGRAPHIC STUDIES: Dg Chest 2 View  Result Date: 06/04/2016 CLINICAL DATA:  Productive cough for 4 days. Right chest pain. Right lung squamous cell carcinoma. EXAM: CHEST  2 VIEW COMPARISON:  04/30/2016 and PET-CT on 03/14/2016 FINDINGS: The heart size and mediastinal contours are within normal limits. Prior CABG again noted. 1.4 cm right lower lobe pulmonary nodule is again seen, consistent with known primary squamous cell carcinoma. No evidence of acute infiltrate, pleural effusion, or pneumothorax. IMPRESSION: Right lower lobe pulmonary nodule, consistent with known primary squamous cell carcinoma. No new findings. Electronically Signed   By: Earle Gell  M.D.   On: 06/04/2016 09:08   Ct Angio Chest Pe W And/or Wo Contrast  Result Date: 06/04/2016 CLINICAL DATA:  Productive cough and right-sided chest pain EXAM: CT ANGIOGRAPHY CHEST WITH CONTRAST TECHNIQUE: Multidetector CT imaging of the chest was performed using the standard protocol during bolus administration of intravenous contrast. Multiplanar CT image reconstructions and MIPs were obtained to evaluate the vascular anatomy. CONTRAST:  80 mL Omnipaque 350. COMPARISON:  Chest x-ray from earlier in the same day, 02/28/2016 FINDINGS: Cardiovascular: Diffuse aortic calcifications are noted. Changes of coronary bypass grafting are seen. No aneurysmal dilatation or dissection is noted. Heavy coronary calcifications are seen. Cardiac chambers are within normal limits. The pulmonary artery as visualized shows a normal branching pattern without intraluminal filling defect to suggest pulmonary embolism. Mediastinum/Nodes: The thoracic inlet is within normal limits. No significant hilar or mediastinal adenopathy is noted. The esophagus as visualized is air-filled with some mild wall thickening distally although this may be related to incomplete distension. Clinical correlation is recommended. Lungs/Pleura: The lungs are well aerated bilaterally. A right lower lobe mildly spiculated nodule is again identified consistent with the patient's given clinical history. It measures approximately 11 mm and is roughly stable. Lingular nodule it is improved in size and likely post inflammatory in nature. Nodular changes along the minor fissure are again noted on the right stable from the prior study. No new focal abnormality is seen. Upper Abdomen: Within normal limits. Musculoskeletal: Degenerative changes of the thoracic spine are noted. No acute bony abnormality is seen. Review of the MIP images confirms the above findings. IMPRESSION: Stable right lower lobe spiculated nodule consistent with the given clinical history. No  evidence of pulmonary emboli. Chronic changes in the lungs bilaterally. Thickening of the distal esophagus. No abnormality was noted on prior PET-CT in this likely represents some changes related to reflux or incomplete distension. Electronically Signed   By: [name] M.D.   On: 06/04/2016 10:40    ASSESSMENT AND PLAN: This is a very pleasant 75 years old white female with: 1) stage IA non-small cell lung cancer, squamous cell carcinoma presented with right lower lobe pulmonary nodule. She status post stereotactic body radiotherapy under the care of Dr. Tammi Klippel. The patient is scheduled to have repeat CT scan of the chest in 2 months by Dr. Tammi Klippel. 2) iron deficiency anemia: The patient has some issues with the oral iron tablets and concerned about taking Integra plus. I recommended for her to consider Feraheme infusion 510 mg IV weekly 2 doses. She is expected to start the first dose on 06/12/2016. I will see her back for follow-up visit in 2 months for reevaluation with repeat iron  study. 3) acute bronchitis: I started the patient on Augmentin 875 mg by mouth twice a day for 7 days. She was advised to contact her primary care physician if she has no improvement in her condition. The patient was advised to call immediately if she has any concerning symptoms in the interval.  The patient voices understanding of current disease status and treatment options and is in agreement with the current care plan.  All questions were answered. The patient knows to call the clinic with any problems, questions or concerns. We can certainly see the patient much sooner if necessary.  Disclaimer: This note was dictated with voice recognition software. Similar sounding words can inadvertently be transcribed and may not be corrected upon review.

## 2016-06-07 NOTE — Telephone Encounter (Signed)
Message sent to infusion scheduler to be added. AVS report and appointment schedule given to patient, per 06/07/16 los.

## 2016-06-07 NOTE — Telephone Encounter (Signed)
Per staff message and LOS I have scheduled appts. Notified the scheduler of appts

## 2016-06-11 ENCOUNTER — Telehealth: Payer: Self-pay | Admitting: Internal Medicine

## 2016-06-11 NOTE — Telephone Encounter (Signed)
Patient called to confirm inf appointments for 11/1 and 11/8.

## 2016-06-13 ENCOUNTER — Ambulatory Visit (HOSPITAL_BASED_OUTPATIENT_CLINIC_OR_DEPARTMENT_OTHER): Payer: Medicare HMO

## 2016-06-13 VITALS — BP 152/50 | HR 66 | Temp 98.9°F | Resp 16

## 2016-06-13 DIAGNOSIS — D509 Iron deficiency anemia, unspecified: Secondary | ICD-10-CM

## 2016-06-13 DIAGNOSIS — D508 Other iron deficiency anemias: Secondary | ICD-10-CM

## 2016-06-13 MED ORDER — SODIUM CHLORIDE 0.9 % IV SOLN
Freq: Once | INTRAVENOUS | Status: AC
  Administered 2016-06-13: 10:00:00 via INTRAVENOUS

## 2016-06-13 MED ORDER — SODIUM CHLORIDE 0.9 % IV SOLN
510.0000 mg | Freq: Once | INTRAVENOUS | Status: AC
  Administered 2016-06-13: 510 mg via INTRAVENOUS
  Filled 2016-06-13: qty 17

## 2016-06-13 NOTE — Patient Instructions (Signed)

## 2016-06-18 ENCOUNTER — Encounter: Payer: Self-pay | Admitting: Pulmonary Disease

## 2016-06-18 ENCOUNTER — Ambulatory Visit (INDEPENDENT_AMBULATORY_CARE_PROVIDER_SITE_OTHER): Payer: Medicare HMO | Admitting: Pulmonary Disease

## 2016-06-18 ENCOUNTER — Ambulatory Visit: Payer: Medicare HMO

## 2016-06-18 VITALS — BP 136/62 | HR 55 | Temp 99.1°F | Ht 62.0 in | Wt 173.2 lb

## 2016-06-18 DIAGNOSIS — R0602 Shortness of breath: Secondary | ICD-10-CM

## 2016-06-18 DIAGNOSIS — I739 Peripheral vascular disease, unspecified: Secondary | ICD-10-CM

## 2016-06-18 DIAGNOSIS — I10 Essential (primary) hypertension: Secondary | ICD-10-CM

## 2016-06-18 DIAGNOSIS — I2510 Atherosclerotic heart disease of native coronary artery without angina pectoris: Secondary | ICD-10-CM | POA: Diagnosis not present

## 2016-06-18 DIAGNOSIS — D539 Nutritional anemia, unspecified: Secondary | ICD-10-CM

## 2016-06-18 DIAGNOSIS — J449 Chronic obstructive pulmonary disease, unspecified: Secondary | ICD-10-CM

## 2016-06-18 DIAGNOSIS — C3431 Malignant neoplasm of lower lobe, right bronchus or lung: Secondary | ICD-10-CM | POA: Diagnosis not present

## 2016-06-18 DIAGNOSIS — I6523 Occlusion and stenosis of bilateral carotid arteries: Secondary | ICD-10-CM | POA: Diagnosis not present

## 2016-06-18 NOTE — Progress Notes (Signed)
Subjective:     Patient ID: [name], female   DOB: May 18, 1941, 75 y.o.   MRN: [id number]  HPI  ~  October 28, 2015:  Initial pulmonary consult by SN>  PCP is DrBouska    28 y/o WF, [name], w/ hx COPD, HBP, CAD-s/p 3 vessel CABG, severe ASPVD, HL, HH/GERD, Divertics, etc...    She was prev evaluated by DrWert in 2014 & DrGonzalez before that>  She had mult medication intolerances and seemed to do better w/ NEBS; also had hx chr sinusitis & GERD...     Daughter called w/ request for a post-hosp f/u visit>  She was [name] 3/11 - 10/24/15 by [facility] w/ CAP, COPD exacerbation;  She presented w/ cough, small amt yellow sput, temp to 101, and incr SOB;  ER eval revealed T101.3, & exam w/ decr BS +wheezes and basilar rhonchi;  CXR showed patchy RLL airsp dis, Flu panel neg, unable to get sput for culture, Labs- Cr=1.27, BS~140-190, Hg~10-11 w/ MCV=83;  She was treated w/ O2, IVF, Duoneb, Levaquin & Pred;  Disch on Duoneb via nebulizer Qid, Dulera100-2spBid, Doxy100Bid, Pred taper...     Currently she feels back to baseline w/ sl cough, white sput & no discoloration or blood, +DOE w/ exertion and some ADLs, no CP, palpit, dizzy, edema;  On NEB w/ DUONEB Qid, DULERA100-2spBid, Pred'10mg'$  tapering sched from recent [place] as needed, Zyrtek prn...  Smoking Hx>  Ex-smoker- started age 39, smoked for ~40 yrs up to 1+ppd, and quit in 2002 when she had CABG; total = 45+pack-year smoking history...   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  Occup Hx>  No known asbestos or toxic exposures  Current Meds>  ASA/Plavix, AZOR 5/40, Catapress0.1-1/2Bid, Crestor10, Dexilant60,  (not on DM meds- "I'm pre-diabetic")... EXAM shows Afeb, VSS, O2sat=98% on RA; Wt=174#, 5'2"Tall, BMI=31;  HEENT- neg, mallampati1, bilat CBruits & L-CAE scar;  Chest- decrBS, clear w/o w/r/r;  Heart- CABG scar, RR Gr1-2/6 SEM w/o r/g;  Abd- +epig bruit, w/o masses;  Ext- w/o c/ce;  Neuro- w/o focal deficits...   PF reported from 2013>  FEV1=1.30 (58%), %1sec=65, no change post bronchdil, DLCO=74%  CT Angio Chest 08/02/14 showed NEG for PE, extensive CAD- s/p CABG, subsolid nodules in RML/ RLL pleural thickening & bibasilar atx, hepatic steatosis, no adenopathy...  CXR 10/22/15 showed norm heart size, s/p CABG, patchy airsp dis in right base, incr interstitial markings diffusely, biapical scarring...   CXR 10/28/15 shows resolved patchy RLL pneumonia, norm heart size, s/p CABG, atherosclerotic changes in Ao, etc...   Spirometry 10/28/15>  FVC=1.62 (63%), FEV1=1.19 (60%), %1sec=73, mid-flows reduced at 48% predicted;  This is c/w a mild obstructive ventilatory defect & GOLD Stage2 COPD, can't r/o superimposed restriction w/o LV measurement.  Ambulatory Oximetry 10/28/15> O2sat=100% on RA at rest w/ pulse=61;  Pt ambulated 3Laps in the office w/ lowest O2sat=99% w/ pulse=73/min...   LABS reviewed in EPIC>  She needs A1c, Anemia labs, TSH if not checked in the interval by DrBoushka... IMP >>    COPD, GOLD Stage 2 disease    Ex-smoker-- quit 2002, 45+pack-year smoking hx    CAP 3/17-- NOS, resolved w/ Levaquin,  Doxy, but she claims allergic to everything...    Cardiac issues>  HBP, CAD- s/p 3 vessel CABG in 2002, severe ASPVD w/ left CAE 2011, 30m right MCA aneurysm-- followed by [facility]lizabeth [facility]- DrDickson, IR-DrDeveshwar...    Medical issues>  HBP, HL, pre-diabetes, HH/GERD/ Divertics, colon polyps, HAs (s/p craniotomy for Arnold-Chiari malformation)-- followed by DrBoushka & DrWillis... PLAN >>     We discussed her COPD and recent hosp for CAP;  She is asked to use the NEBULIZER w/ DUONEB  Tid regularly, DULERA100-2spBid, and MUCINEX '600mg'$  1-2tabs bid w/ fluids... She will maintain regular follow up w/ her numerous physicians and plan pulm recheck in 6-8 wks...    ~  Dec 12, 2015:  6wk ROV w/ SN>  [name] on her NEBULIZER w/ Duoneb Qid, Dulera100-2spBid, & AlbutHFA rescue inhaler as needed; she has finished her prev Pred taper and notes that her breathing is improved & she feels back to baseline- sl cough, small amt whitish sput, min end-exp wheezing, and chr stable DOE, she denies CP/ hemoptysis/ chest tightness/ f/c/s, etc...     COPD, GOLD Stage 2 disease>  Back to baseline on her NEB w/ Duoneb Qid, Dulera100-2spBid, AlbutHFA rescue as needed; Rec to continue same + gradual exercise program & work on wt reduction...    Ex-smoker-- quit 2002, 45+pack-year smoking hx    CAP 3/17-- NOS, resolved w/ Levaquin, Doxy, but she claims allergic to everything...    Cardiac issues>  HBP, CAD- s/p 3 vessel CABG in 2002, severe ASPVD w/ left CAE 2011, 241mright MCA aneurysm-- followed by [facility]- DrDickson, IR-DrDeveshwar...    Medical issues>  HBP, HL, borderline diabetes, HH/GERD/ Divertics, colon polyps, HAs (s/p craniotomy for Arnold-Chiari malformation), Anemia-- followed by DrBoushka & DrWillis... EXAM shows Afeb, VSS, O2sat=98% on RA; Wt=172#, 5'2"Tall, BMI=31;  HEENT- neg, mallampati1, bilat CBruits & L-CAE scar;  Chest- decrBS, clear w/o w/r/r;  Heart- CABG scar, RR Gr1-2/6 SEM w/o r/g;  Abd- +epig bruit, w/o masses;  Ext- w/o c/ce;  Neuro- w/o focal deficits...  IMP/PLAN>>  [name] approaching her baseline, s/p pneumonia 10/2015;  Asked to continue NEBs Tid-Qid, Dulera Bid, & rescue inhaler as needed;  She will maintain general med follow up w/ DrBouska, Cards w/ DrHarwani, [facility]- DrDickson, and Neuro- DrWillis;  We plan ROV recheck 3 months...  ADDENDUM>>  [name] to the ER 02/24/16 c/o nausea, vomiting, & diarrhea assoc w/ some crampy abd pain; nothing acute was  found on exam & eval- given Zofran w/ improvement; she also reported SOB & CXR 02/24/16 showed norm heart size, s/pCABG, hyperexpanded lungs, vague nodular opac in left base & right perihilar areas, otherw clear w/o pneumonia or edema;  She followed up w/ DrBoushka & had a CT Chest done 02/28/16 showing scarring in the apicies, and a dominant spiculated 1157mosterior RLL nodule (prev CTA in 2015 showed it at 6mm76mseveral other tiny opacities noted, no adenopathy;  A subseq PET-CT 03/14/16 confirmed a hypermetabolic RLL nodule & no other hypermet lesions seen;  She had a needle Bx 04/11/16=> pos for squamous cell ca...     She was seen by DrBartle for TSurg but he felt that she was not an operative candidate based on her COPD & severe vascular pathology & suggested SBRT;  [facility] & she is pending the start of XRT at this time...   FullPFTs 04/10/16 showed FVC=1.53 (57%), FEV1=1.09 (54%), %1sec=71, mid-flows reduced at  42% predicted; post bronchodil FEV1 improved 17% to 1.28L;  TLC=3.77 (79%), RV=2.07 (96%), RV/TLC=55%;  DLCO=51% predicted and the DL/VA=86% predicted...  ~  June 18, 2016:  44moROV w/ SN>  & MSzymborskireturns stating "I'm tired of doctors"- not feeling well, tired, freezing, giving out w/ min activ & ADLs, SOB all the time, etc... I've reviewed the recent Epic data>>    She was eval by DrBartle 04/04/16> not felt to be a surg candidate due to her severe vasc dis & underlying COPD; needle bx of RLL lesion was pos for Squamous Cell Ca=> referred for XRT & seen by DrManning...    She completed her XRT w/ DrManning & was seen 05/28/16>  s/p curative SBRT- 54 Gy in 3 fractions (05/2016) & tol well...    She saw DrHung, GI for anemia w/u w/ EGD/ Colon 05/25/16>  EGD was WNL;  Colon was also WNL...    She was seen in [place] 06/04/16 w/ cough & right sided CP x1wk, note reviewed, CXR/ CT Angio/ LABS- all below; she was not given antibiotic & disch for f/u by  PCP...    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     We reviewed the following updated medical problems during today's office visit >>     COPD, GOLD Stage 2 disease (w/ revers component)>  Back to baseline on her NEB w/ Duoneb Qid (she is only doing it prn "I don't need it"), Dulera100-2spBid, AlbutHFA rescue as needed; Rec to continue same + gradual exercise program & work on wt reduction...    RLL Pulm nodule=> Bx 04/11/16= Squamous cell ca & pt eval by DrBartle, DrMohamed, DrManning w/ SBRT given 05/2016 (54 Gy in 3 fractions)...    Ex-smoker-- quit 2002, 45+pack-year smoking hx    CAP 3/17-- NOS, resolved w/ Levaquin, Doxy, but she claims allergic to everything; she also had bronchitis 05/2016 treated w/ Augmentiun & improved...    Cardiac issues>  HBP, CAD- s/p 3 vessel CABG in 2002, severe ASPVD w/ left CAE 2011, 21mright MCA aneurysm-- followed by [facility]- DrDickson, IR-DrDeveshwar...    Medical issues>  HBP, HL, borderline diabetes, HH/GERD/ Divertics, colon polyps, HAs (s/p craniotomy for Arnold-Chiari malformation), Anemia-- followed by DrBoushka & DrWillis... EXAM shows Afeb, VSS, O2sat=100% on RA; Wt=173#, 5'2"Tall, BMI=31;  HEENT- neg, mallampati1, bilat CBruits & L-CAE scar;  Chest- decrBS, clear w/o w/r/r;  Heart- CABG scar, RR Gr1-2/6 SEM w/o r/g;  Abd- +epig bruit, w/o masses;  Ext- w/o c/ce;  Neuro- w/o focal deficits...   CXR 06/04/16>  Norm heart size & prior CABG, 1.4cm RLL nodule, no infiltrate or effusion- NAD...   CT Angio Chest 06/04/16>  s/p CABG & diffuse aortic calcif & heavy  coronary calcif, NEG for PE, stable RLL pulm nodule, no adenopathy, chr changes in the lungs bilat...  LABS 05/2016>  CBC- Hg=9.1, mcv=74;  Chems- Cr=1.30;  Fe=27 (7%sat),  Ferritin=8;  B12=546 IMP/PLAN>>  CaAsheleyas Mccullough thru a lot but improved after SBRT for RLL squamous cell ca, recent Feraheme for anemia, and Augmentin for bronchitis;  she is encouraged to stay on the [facility] Bid & use the NEBS as needed; definitely needs to incr physical activity & get her weight down... we plan rov in 46mofor pulm check...    Past Medical History:  Diagnosis Date  . 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[place]   . [place]ari malformation ([place]  . Asthma   . 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  . Carotid artery occlusion   . Complication of anesthesia   . COPD (chronic obstructive pulmonary disease) ([place]   . Coronary artery disease   . 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 Diverticulitis   . Gastroesophageal reflux disease   . Hiatal hernia   . Hyperlipidemia   . Hypertension   . Iliac artery aneurysm ([place]   . 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 Myocardial infarction 01/01/2000   Cardiac catheterization  . Peripheral vascular disease ([place]   . PONV (postoperative nausea and vomiting)    occassionally  . Reflux     Past Surgical History:  Procedure Laterality Date  . ABDOMINAL HYSTERECTOMY    . APPENDECTOMY    . Arnold-chiari malformation repair  1998   Suboccipital craniectomy  . CAROTID ENDARTERECTOMY  03/29/2010   Left  CEA  . CHOLECYSTECTOMY     Gall Bladder  . COLONOSCOPY WITH PROPOFOL N/A 04/22/2015   Procedure: COLONOSCOPY WITH PROPOFOL;  Surgeon: PCarol Ada MD;  Location[facility];  Service: Endoscopy;  Laterality: N/A;  . COLONOSCOPY WITH PROPOFOL N/A 05/25/2016   Procedure: COLONOSCOPY WITH PROPOFOL;  Surgeon: PCarol Ada MD;  Location: [facility];  Service: Endoscopy;  Laterality: N/A;  . CORNEAL  TRANSPLANT     Right  . CORONARY ARTERY BYPASS GRAFT  01/01/2000  . ESOPHAGOGASTRODUODENOSCOPY N/A 05/25/2016   Procedure: ESOPHAGOGASTRODUODENOSCOPY (EGD);  Surgeon: PCarol Ada MD;  [facility];  Service: Endoscopy;  Laterality: N/A;  . EYE SURGERY     Laser surgery for retinal hemorrhage  . LEFT HEART CATHETERIZATION WITH CORONARY ANGIOGRAM N/A 08/03/2014   Procedure: LEFT HEART CATHETERIZATION WITH CORONARY ANGIOGRAM;  Surgeon: ABirdie Riddle MD;  [facility];  Service: Cardiovascular;  Laterality: N/A;  . Post Coronary Artery  BPG  01/05/2000   Right jugular sheath removed  . PR VEIN BYPASS GRAFT,AORTO-FEM-POP    . ROTATOR CUFF REPAIR     Right    Outpatient Encounter Prescriptions as of 06/18/2016  Medication Sig  . albuterol (VENTOLIN HFA) 108 (90 BASE) MCG/ACT inhaler Inhale 2 puffs into the lungs every 4 (four) hours as needed for wheezing or shortness of breath (((PLAN B))).  .Marland KitchenamLODipine-olmesartan (AZOR) 5-40 MG tablet Take 1 tablet by mouth daily.  .Marland Kitchenaspirin EC 81 MG tablet Take 81 mg by mouth daily.   . cloNIDine (CATAPRES) 0.1 MG tablet Take 0.05 mg by mouth 2 (two) times daily.   . clopidogrel (PLAVIX) 75 MG tablet Take 1 tablet (75 mg total) by mouth daily.  .Marland Kitchendexlansoprazole (DEXILANT) 60 MG capsule Take 1 capsule (60 mg total) by mouth daily before breakfast.  . ipratropium-albuterol (DUONEB) 0.5-2.5 (3) MG/3ML SOLN Take 3 mLs by nebulization 4 (four) times daily.  . mometasone-formoterol (DULERA) 100-5 MCG/ACT AERO Inhale 2 puffs into the lungs 2 (two) times daily.  . nitroGLYCERIN (NITROSTAT) 0.4 MG SL tablet Place 0.4 mg under the tongue every 5 (five) minutes as needed for chest pain.   . prednisoLONE acetate (PRED FORTE) 1 %  ophthalmic suspension Place 1 drop into the right eye every other day.   . rosuvastatin (CRESTOR) 10 MG tablet Take 10 mg by mouth at bedtime.   . [DISCONTINUED] amoxicillin-clavulanate (AUGMENTIN) 875-125 MG tablet Take 1  tablet by mouth 2 (two) times daily. (Patient not taking: Reported on 06/18/2016)  . [DISCONTINUED] FeFum-FePoly-FA-B Cmp-C-Biot (INTEGRA PLUS) CAPS Take 1 capsule by mouth daily. (Patient not taking: Reported on 06/18/2016)   No facility-administered encounter medications on file as of 06/18/2016.     Allergies  Allergen Reactions  . Hydromorphone Palpitations and Other (See Comments)    DILAUDID  -  Pt had a Heart Attack after taking Dilaudid.  . Levaquin [Levofloxacin] Other (See Comments)    Chest pressure, SOB, "pain in between shoulder blades", sweaty -as reported by patient per experience in ED this afternoon  . Codeine Other (See Comments)    Dr. Terrence Dupont advised patient not to take this medication  . Doxycycline Swelling    Mouth, lips, feet swelling  . Oxycodone-Acetaminophen Other (See Comments)    Says it makes her feel weird  . Risedronate Other (See Comments)    Chest pain  . Avelox [Moxifloxacin Hcl In Nacl] Palpitations    Immunization History  Administered Date(s) Administered  . Influenza Whole 05/13/2012  . Influenza,inj,Quad PF,36+ Mos 04/14/2015, 04/16/2016  . Pneumococcal Conjugate-13 04/14/2015  . Pneumococcal Polysaccharide-23 05/13/2012    Current Medications, Allergies, Past Medical History, Past Surgical History, Family History, and Social History were reviewed in [employer] record.   Review of Systems             All symptoms NEG except where BOLDED >>  Constitutional:  F/C/S, fatigue, anorexia, unexpected weight change. HEENT:  HA, visual changes, hearing loss, earache, nasal symptoms, sore throat, mouth sores, hoarseness. Resp:  cough, sputum, hemoptysis; SOB, tightness, wheezing. Cardio:  CP, palpit, DOE, orthopnea, edema. GI:  N/V/D/C, blood in stool; reflux, abd pain, distention, gas. GU:  dysuria, freq, urgency, hematuria, flank pain, voiding difficulty. MS:  joint pain, swelling, tenderness, decr ROM; neck pain, back  pain, etc. Neuro:  HA, tremors, seizures, dizziness, syncope, weakness, numbness, gait abn. Skin:  suspicious lesions or skin rash. Heme:  adenopathy, bruising, bleeding. Psyche:  confusion, agitation, sleep disturbance, hallucinations, anxiety, depression suicidal.   Objective:   Physical Exam       Vital Signs:  Reviewed...   General:  WD, overweight, 75 y/o WF in NAD; alert & oriented; pleasant & cooperative... HEENT:  [place]/AT; Conjunctiva- pink, Sclera- nonicteric, EOM-wnl, PERRLA, Fundi-benign; EACs-clear, TMs-wnl; NOSE-clear; THROAT-clear & wnl.  Neck:  Supple w/ decr ROM; no JVD; s/p L-CAE, bilat bruits; no thyromegaly or nodules palpated; no lymphadenopathy.  Chest:  decr BS, clear to P & A; without wheezes, rales, or rhonchi heard. Heart:  Regular Rhythm; norm S1 & S2, Gr1-2/6 SEM, w/o rubs or gallops detected. Abdomen:  Soft & nontender- no guarding or rebound; normal bowel sounds; no organomegaly or masses palpated. Ext:  decrROM; without deformities +arthritic changes; no varicose veins, +venous insuffic, no edema;  Pulses intact w/o bruits. Neuro:  No focal neuro deficits, +gait abn... Derm:  No lesions noted; no rash etc. Lymph:  No cervical, supraclavicular, axillary, or inguinal adenopathy palpated.   Assessment:      IMP >>    COPD, GOLD Stage 2 disease>  Back to baseline on her NEB w/ Duoneb Qid, Dulera100-2spBid, AlbutHFA rescue as needed; Rec to continue same + gradual exercise program & work on  wt reduction...    RLL pulm nodule=> Squamous cell ca (Bx 03/2016), s/p XRT 05/2016, followed by [facility] & DrMohamed...    Ex-smoker-- quit 2002, 45+pack-year smoking hx    CAP 3/17-- NOS, resolved w/ Levaquin, Doxy, but she claims allergic to everything...    Cardiac issues>  HBP, CAD- s/p 3 vessel CABG in 2002, severe ASPVD w/ left CAE 2011, 71m right MCA aneurysm-- followed by DElizabeth Sauer [facility]- DrDickson, IR-DrDeveshwar...    Medical issues>  HBP, HL, borderline  diabetes, HH/GERD/ Divertics, colon polyps, HAs (s/p craniotomy for Arnold-Chiari malformation), Anemia-- followed by DrBoushka & DrWillis...  PLAN >>  10/28/15>   We discussed her COPD and recent hosp for CAP;  She is asked to use the NEBULIZER w/ DUONEB Tid regularly, DULERA100-2spBid, and MUCINEX '600mg'$  1-2tabs bid w/ fluids... She will maintain regular follow up w/ her numerous physicians and plan pulm recheck in 6-8 wks. 12/12/15>   CChadeis approaching her baseline, s/p pneumonia 10/2015;  Asked to continue NEBs Tid-Qid, Dulera Bid, & rescue inhaler as needed;  She will maintain general med follow up w/ DrBouska, Cards w/ DrHarwani, [facility]- DrDickson, and Neuro- DrWillis;  We plan ROV recheck 3 months 06/18/16>  CPhilliphas Mccullough thru a lot but improved after SBRT for RLL squamous cell ca, recent Feraheme for anemia, and Augmentin for bronchitis; she is [facility] & use the NEBS as needed; definitely needs to incr physical activity & get her weight down... we plan rov [place] pulm check...     Plan:     Patient's Medications  New Prescriptions   No medications on file  Previous Medications   ALBUTEROL (VENTOLIN HFA) 108 (90 BASE) MCG/ACT INHALER    Inhale 2 puffs into the lungs every 4 (four) hours as needed for wheezing or shortness of breath (((PLAN B))).   AMLODIPINE-OLMESARTAN (AZOR) 5-40 MG TABLET    Take 1 tablet by mouth daily.   ASPIRIN EC 81 MG TABLET    Take 81 mg by mouth daily.    CLONIDINE (CATAPRES) 0.1 MG TABLET    Take 0.05 mg by mouth 2 (two) times daily.    CLOPIDOGREL (PLAVIX) 75 MG TABLET    Take 1 tablet (75 mg total) by mouth daily.   DEXLANSOPRAZOLE (DEXILANT) 60 MG CAPSULE    Take 1 capsule (60 mg total) by mouth daily before breakfast.   IPRATROPIUM-ALBUTEROL (DUONEB) 0.5-2.5 (3) MG/3ML SOLN    Take 3 mLs by nebulization 4 (four) times daily.   MOMETASONE-FORMOTEROL (DULERA) 100-5 MCG/ACT AERO    Inhale 2 puffs into the lungs 2 (two) times  daily.   NITROGLYCERIN (NITROSTAT) 0.4 MG SL TABLET    Place 0.4 mg under the tongue every 5 (five) minutes as needed for chest pain.    PREDNISOLONE ACETATE (PRED FORTE) 1 % OPHTHALMIC SUSPENSION    Place 1 drop into the right eye every other day.    ROSUVASTATIN (CRESTOR) 10 MG TABLET    Take 10 mg by mouth at bedtime.   Modified Medications   No medications on file  Discontinued Medications   AMOXICILLIN-CLAVULANATE (AUGMENTIN) 875-125 MG TABLET    Take 1 tablet by mouth 2 (two) times daily.   FEFUM-FEPOLY-FA-B CMP-C-BIOT (INTEGRA PLUS) CAPS    Take 1 capsule by mouth daily.

## 2016-06-18 NOTE — Patient Instructions (Signed)
Today we updated your med list in our EPIC system...    Continue your current medications the same...  Continue the DULERA- 2 puffs twice daily...  Stay as active as possible...  Call for any questions...  Let's plan a follow up visit in 32mo sooner if needed for problems..Marland KitchenMarland Kitchen

## 2016-06-20 ENCOUNTER — Encounter: Payer: Self-pay | Admitting: Internal Medicine

## 2016-06-20 ENCOUNTER — Ambulatory Visit (HOSPITAL_BASED_OUTPATIENT_CLINIC_OR_DEPARTMENT_OTHER): Payer: Medicare HMO

## 2016-06-20 ENCOUNTER — Ambulatory Visit: Payer: Medicare HMO | Admitting: Pulmonary Disease

## 2016-06-20 VITALS — BP 153/66 | HR 65 | Temp 98.0°F | Resp 16

## 2016-06-20 DIAGNOSIS — D509 Iron deficiency anemia, unspecified: Secondary | ICD-10-CM | POA: Diagnosis not present

## 2016-06-20 DIAGNOSIS — D508 Other iron deficiency anemias: Secondary | ICD-10-CM

## 2016-06-20 MED ORDER — SODIUM CHLORIDE 0.9 % IV SOLN
510.0000 mg | Freq: Once | INTRAVENOUS | Status: AC
  Administered 2016-06-20: 510 mg via INTRAVENOUS
  Filled 2016-06-20: qty 17

## 2016-06-20 MED ORDER — SODIUM CHLORIDE 0.9 % IV SOLN
Freq: Once | INTRAVENOUS | Status: AC
  Administered 2016-06-20: 14:00:00 via INTRAVENOUS

## 2016-06-20 NOTE — Progress Notes (Signed)
Patient returned Medicaid app. Faxed application to [place]. Fax received ok per confirmation sheet.

## 2016-06-20 NOTE — Progress Notes (Signed)
Met with patient to provide Medicaid application. She will fill out while waiting. Patient brought in proof of income and information regarding her OOP if any copay assistance is available. Patient will bring Medicaid application back and I advised her that I will fax the completed application to [facility] and they will contact her directly.

## 2016-06-20 NOTE — Patient Instructions (Signed)

## 2016-06-21 ENCOUNTER — Encounter: Payer: Self-pay | Admitting: Nutrition

## 2016-06-21 NOTE — Progress Notes (Signed)
Patient was given one case of Ensure Plus.

## 2016-07-10 ENCOUNTER — Telehealth: Payer: Self-pay | Admitting: Pulmonary Disease

## 2016-07-10 MED ORDER — MOMETASONE FURO-FORMOTEROL FUM 100-5 MCG/ACT IN AERO: 2.0000 | INHALATION_SPRAY | Freq: Two times a day (BID) | RESPIRATORY_TRACT | 5 refills | Status: DC

## 2016-07-10 NOTE — Telephone Encounter (Signed)
Rx sent to preferred pharmacy. Pt aware & voiced understanding. Nothing further needed.

## 2016-07-14 ENCOUNTER — Emergency Department (HOSPITAL_COMMUNITY): Payer: Medicare HMO

## 2016-07-14 ENCOUNTER — Emergency Department (HOSPITAL_COMMUNITY)
Admission: EM | Admit: 2016-07-14 | Discharge: 2016-07-14 | Disposition: A | Discharge status: Home / Self Care | Payer: Medicare HMO | Attending: Emergency Medicine | Admitting: Emergency Medicine

## 2016-07-14 ENCOUNTER — Encounter (HOSPITAL_COMMUNITY): Payer: Self-pay

## 2016-07-14 DIAGNOSIS — I509 Heart failure, unspecified: Secondary | ICD-10-CM | POA: Diagnosis not present

## 2016-07-14 DIAGNOSIS — Z79899 Other long term (current) drug therapy: Secondary | ICD-10-CM | POA: Insufficient documentation

## 2016-07-14 DIAGNOSIS — C3491 Malignant neoplasm of unspecified part of right bronchus or lung: Secondary | ICD-10-CM

## 2016-07-14 DIAGNOSIS — Z7982 Long term (current) use of aspirin: Secondary | ICD-10-CM | POA: Insufficient documentation

## 2016-07-14 DIAGNOSIS — Z951 Presence of aortocoronary bypass graft: Secondary | ICD-10-CM | POA: Diagnosis not present

## 2016-07-14 DIAGNOSIS — I252 Old myocardial infarction: Secondary | ICD-10-CM | POA: Insufficient documentation

## 2016-07-14 DIAGNOSIS — I110 Hypertensive heart disease with heart failure: Secondary | ICD-10-CM | POA: Diagnosis not present

## 2016-07-14 DIAGNOSIS — J449 Chronic obstructive pulmonary disease, unspecified: Secondary | ICD-10-CM | POA: Insufficient documentation

## 2016-07-14 DIAGNOSIS — J209 Acute bronchitis, unspecified: Secondary | ICD-10-CM

## 2016-07-14 DIAGNOSIS — Z87891 Personal history of nicotine dependence: Secondary | ICD-10-CM | POA: Diagnosis not present

## 2016-07-14 DIAGNOSIS — I2510 Atherosclerotic heart disease of native coronary artery without angina pectoris: Secondary | ICD-10-CM | POA: Diagnosis not present

## 2016-07-14 DIAGNOSIS — R0781 Pleurodynia: Secondary | ICD-10-CM | POA: Diagnosis present

## 2016-07-14 LAB — BRAIN NATRIURETIC PEPTIDE: B NATRIURETIC PEPTIDE 5: 57.2 pg/mL (ref 0.0–100.0)

## 2016-07-14 LAB — COMPREHENSIVE METABOLIC PANEL
ALK PHOS: 76 U/L (ref 38–126)
ALT: 14 U/L (ref 14–54)
ANION GAP: 9 (ref 5–15)
AST: 25 U/L (ref 15–41)
Albumin: 4.2 g/dL (ref 3.5–5.0)
BILIRUBIN TOTAL: 0.6 mg/dL (ref 0.3–1.2)
BUN: 21 mg/dL — ABNORMAL HIGH (ref 6–20)
CALCIUM: 9.2 mg/dL (ref 8.9–10.3)
CO2: 22 mmol/L (ref 22–32)
Chloride: 105 mmol/L (ref 101–111)
Creatinine, Ser: 1.13 mg/dL — ABNORMAL HIGH (ref 0.44–1.00)
GFR calc non Af Amer: 47 mL/min — ABNORMAL LOW (ref >60)
GFR, EST AFRICAN AMERICAN: 54 mL/min — AB (ref >60)
Glucose, Bld: 106 mg/dL — ABNORMAL HIGH (ref 65–99)
POTASSIUM: 4.5 mmol/L (ref 3.5–5.1)
SODIUM: 136 mmol/L (ref 135–145)
TOTAL PROTEIN: 7.7 g/dL (ref 6.5–8.1)

## 2016-07-14 LAB — CBC WITH DIFFERENTIAL/PLATELET
BASOS ABS: 0 10*3/uL (ref 0.0–0.1)
BASOS PCT: 0 %
Eosinophils Absolute: 0.2 10*3/uL (ref 0.0–0.7)
Eosinophils Relative: 4 %
HEMATOCRIT: 36 % (ref 36.0–46.0)
HEMOGLOBIN: 11.7 g/dL — AB (ref 12.0–15.0)
LYMPHS PCT: 40 %
Lymphs Abs: 2.3 10*3/uL (ref 0.7–4.0)
MCH: 26.7 pg (ref 26.0–34.0)
MCHC: 32.5 g/dL (ref 30.0–36.0)
MCV: 82 fL (ref 78.0–100.0)
Monocytes Absolute: 0.4 10*3/uL (ref 0.1–1.0)
Monocytes Relative: 8 %
NEUTROS ABS: 2.7 10*3/uL (ref 1.7–7.7)
NEUTROS PCT: 49 %
Platelets: 220 10*3/uL (ref 150–400)
RBC: 4.39 MIL/uL (ref 3.87–5.11)
RDW: 23.5 % — ABNORMAL HIGH (ref 11.5–15.5)
WBC: 5.6 10*3/uL (ref 4.0–10.5)

## 2016-07-14 LAB — I-STAT CG4 LACTIC ACID, ED: LACTIC ACID, VENOUS: 0.92 mmol/L (ref 0.5–1.9)

## 2016-07-14 LAB — TROPONIN I: Troponin I: <0.03 ng/mL (ref <0.03)

## 2016-07-14 MED ORDER — IOPAMIDOL (ISOVUE-370) INJECTION 76%
100.0000 mL | Freq: Once | INTRAVENOUS | Status: AC | PRN
  Administered 2016-07-14: 80 mL via INTRAVENOUS

## 2016-07-14 MED ORDER — KETOROLAC TROMETHAMINE 10 MG PO TABS: 10.0000 mg | ORAL_TABLET | Freq: Four times a day (QID) | ORAL | 0 refills | Status: DC | PRN

## 2016-07-14 MED ORDER — AMOXICILLIN 500 MG PO CAPS: 500.0000 mg | ORAL_CAPSULE | Freq: Three times a day (TID) | ORAL | 0 refills | Status: DC

## 2016-07-14 NOTE — ED Provider Notes (Signed)
[facility] DEPT Provider Note   CSN: [id number] Arrival date & time: 07/14/16  1013     History   Chief Complaint Chief Complaint  Patient presents with  . Shortness of Breath  . lung pain    HPI [name] is a 75 y.o. 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 Tammi Klippel, COPD, CAD s/p CABG, and CHF presenting with 2 weeks of worsening dyspnea, productive cough, and right lower rib pain. She has tried nebulizer treatment at home with no alleviation of symptoms. Patient states that she has had similar symptoms prior when diagnosed with pneumonia. Mucous production is thick, white yellow. No evidence of hemoptysis. Patient complains of sharp pain in right lower rib upon deep inspiration. Has had decreased appetite, but denies f/v/diarrhea. No sick contacts or rhinorrhea. She denies any tight squeezing chest pain, leg pain or cramping. She endorses waking up at night out of breath and requires some elevation to sleep.  HPI  Past Medical History:  Diagnosis Date  . 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 Asthma   . 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  . Carotid artery occlusion   . Complication of anesthesia   . COPD (chronic obstructive pulmonary disease) ([place])   . Coronary artery disease   . 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 Diverticulitis   . Gastroesophageal reflux disease   . Hiatal hernia   . Hyperlipidemia   . Hypertension   . Iliac artery aneurysm ([place])   . 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 Myocardial infarction 01/01/2000   Cardiac catheterization  . Peripheral vascular disease ([place])   . PONV (postoperative nausea and vomiting)    occassionally  . Reflux     Patient Active Problem List   Diagnosis Date Noted  . Iron deficiency anemia 06/07/2016  . Bronchitis, acute  06/07/2016  . 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 Primary cancer of right lower lobe of lung ([place]) 04/25/2016  . 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 Foot pain, right 10/24/2015  . CAP (community acquired pneumonia) 10/22/2015  . Prolonged QT interval 10/22/2015  . UTI (urinary tract infection) 10/22/2015  . COPD exacerbation ([place]) 10/22/2015  . Neck pain on right side 01/05/2015  . Chest pain 08/02/2014  . Intractable headache 08/02/2014  . HLD (hyperlipidemia) 08/02/2014  . Essential hypertension 08/02/2014  . GERD (gastroesophageal reflux disease) 08/02/2014  . Leg pain, right 08/02/2014  . HA (headache)   . Carotid artery stenosis 12/09/2013  . 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  . Dehydration 04/02/2013  . CAD (coronary artery disease) 04/02/2013  . AKI (acute kidney injury) ([place]) 04/01/2013  . Nausea & vomiting 04/01/2013  . Diarrhea 04/01/2013  . Hypokalemia 04/01/2013  . Hyponatremia 04/01/2013  . Peripheral vascular disease ([place]) 01/07/2013  . Occlusion and stenosis of carotid artery without mention of cerebral infarction 01/07/2013  . Dizziness and giddiness 01/07/2013  . Shortness of breath 01/07/2013  . Cough 11/17/2012  . COPD GOLD II 11/17/2012    Past Surgical History:  Procedure Laterality Date  . ABDOMINAL HYSTERECTOMY    . APPENDECTOMY    . Arnold-chiari malformation repair  1998   Suboccipital craniectomy  . CAROTID ENDARTERECTOMY  03/29/2010   Left  CEA  . CHOLECYSTECTOMY     Gall Bladder  . COLONOSCOPY WITH PROPOFOL N/A 04/22/2015  Procedure: COLONOSCOPY WITH PROPOFOL;  Surgeon: Carol Ada, MD;  Location: [facility];  Service: Endoscopy;  Laterality: N/A;  . COLONOSCOPY WITH PROPOFOL N/A 05/25/2016   Procedure: COLONOSCOPY WITH PROPOFOL;  Surgeon: Carol Ada, MD;  Location: [facility];  Service: Endoscopy;  Laterality: N/A;  . CORNEAL TRANSPLANT     Right  . CORONARY ARTERY BYPASS GRAFT  01/01/2000  . ESOPHAGOGASTRODUODENOSCOPY N/A 05/25/2016    Procedure: ESOPHAGOGASTRODUODENOSCOPY (EGD);  Surgeon: Carol Ada, MD;  Location: [facility];  Service: Endoscopy;  Laterality: N/A;  . EYE SURGERY     Laser surgery for retinal hemorrhage  . LEFT HEART CATHETERIZATION WITH CORONARY ANGIOGRAM N/A 08/03/2014   Procedure: LEFT HEART CATHETERIZATION WITH CORONARY ANGIOGRAM;  Surgeon: Birdie Riddle, MD;  Location: [facility];  Service: Cardiovascular;  Laterality: N/A;  . Post Coronary Artery  BPG  01/05/2000   Right jugular sheath removed  . PR VEIN BYPASS GRAFT,AORTO-FEM-POP    . ROTATOR CUFF REPAIR     Right    OB History    No data available       Home Medications    Prior to Admission medications   Medication Sig Start Date End Date Taking? Authorizing Provider  albuterol (VENTOLIN HFA) 108 (90 BASE) MCG/ACT inhaler Inhale 2 puffs into the lungs every 4 (four) hours as needed for wheezing or shortness of breath (((PLAN B))). 08/09/13   Pollie Friar, MD  amLODipine-olmesartan (AZOR) 5-40 MG tablet Take 1 tablet by mouth daily.    Historical Provider, MD  amoxicillin (AMOXIL) 500 MG capsule Take 1 capsule (500 mg total) by mouth 3 (three) times daily. 07/14/16   Isla Pence, MD  aspirin EC 81 MG tablet Take 81 mg by mouth daily.     Historical Provider, MD  cloNIDine (CATAPRES) 0.1 MG tablet Take 0.05 mg by mouth 2 (two) times daily.     Historical Provider, MD  clopidogrel (PLAVIX) 75 MG tablet Take 1 tablet (75 mg total) by mouth daily. 04/02/12   Conrad LaSalle, MD  dexlansoprazole (DEXILANT) 60 MG capsule Take 1 capsule (60 mg total) by mouth daily before breakfast. 11/17/12   Tanda Rockers, MD  ipratropium-albuterol (DUONEB) 0.5-2.5 (3) MG/3ML SOLN Take 3 mLs by nebulization 4 (four) times daily. 10/24/15   Janece Canterbury, MD  ketorolac (TORADOL) 10 MG tablet Take 1 tablet (10 mg total) by mouth every 6 (six) hours as needed. 07/14/16   Isla Pence, MD  mometasone-formoterol [facility]) 100-5 MCG/ACT AERO Inhale 2 puffs into the  lungs 2 (two) times daily. 07/10/16   Noralee Space, MD  nitroGLYCERIN (NITROSTAT) 0.4 MG SL tablet Place 0.4 mg under the tongue every 5 (five) minutes as needed for chest pain.     Historical Provider, MD  prednisoLONE acetate (PRED FORTE) 1 % ophthalmic suspension Place 1 drop into the right eye every other day.  02/26/14   Historical Provider, MD  rosuvastatin (CRESTOR) 10 MG tablet Take 10 mg by mouth at bedtime.     Historical Provider, MD    Family History Family History  Problem Relation Age of Onset  . Heart disease Mother     Heart Disease before age 3  . Hypertension Mother   . Hyperlipidemia Mother   . Heart attack Mother   . Clotting disorder Mother   . Heart disease Father     Heart Disease before age 75  . Heart attack Father   . Hyperlipidemia Father   . Hypertension Father   .  Heart disease Brother     Heart Disease before age 46  . Hyperlipidemia Brother   . Hypertension Brother   . Clotting disorder Brother   . AAA (abdominal aortic aneurysm) Brother   . Cerebral aneurysm Sister   . Hypertension Sister   . AAA (abdominal aortic aneurysm) Sister   . Asthma Sister   . Cerebral aneurysm Brother   . Cancer Brother     Lung  . Hypertension Brother   . Heart attack Brother   . Heart disease Brother     Aneurysm of Brain  . Hypertension Brother   . Heart disease Brother   . Heart disease Brother   . Stroke Son     Aneurysm of Stomach  . AAA (abdominal aortic aneurysm) Son   . Cancer Maternal Uncle     great uncle/cancer/type unknown    Social History Social History  Substance Use Topics  . Smoking status: Former Smoker    Packs/day: 1.50    Years: 30.00    Types: Cigarettes    Quit date: 08/13/2000  . Smokeless tobacco: Never Used  . Alcohol use No     Allergies   Hydromorphone; Levaquin [levofloxacin]; Codeine; Doxycycline; Oxycodone-acetaminophen; Risedronate; and Avelox [moxifloxacin hcl in nacl]   Review of Systems Review of Systems    Constitutional: Positive for chills.  HENT: Negative.   Eyes: Negative.   Cardiovascular: Positive for leg swelling. Negative for chest pain.  Gastrointestinal: Negative.   Endocrine: Negative.   Genitourinary: Negative.   Skin: Negative.   Allergic/Immunologic: Negative.   Psychiatric/Behavioral: Negative.   All other systems reviewed and are negative.    Physical Exam Updated Vital Signs BP 160/72 (BP Location: Left Arm)   Pulse 61   Temp 97.7 F (36.5 C) (Oral)   Resp 23   SpO2 100%   Physical Exam  Constitutional: She is oriented to person, place, and time. She appears well-developed and well-nourished. No distress.  HENT:  Head: Normocephalic and atraumatic.  Eyes: Conjunctivae and EOM are normal. No scleral icterus.  Neck: Normal range of motion. No tracheal deviation present.  Cardiovascular: Normal rate and regular rhythm.  Exam reveals no gallop and no friction rub.   No murmur heard. Pulmonary/Chest: Effort normal and breath sounds normal. No accessory muscle usage or stridor. No tachypnea. No respiratory distress. She has no wheezes. She has no rales. She exhibits tenderness.  Point tenderness to palpation at lower ribs at midaxillary line.  Abdominal: Soft. Normal appearance. She exhibits no distension, no pulsatile midline mass and no mass. There is no tenderness.  Genitourinary:  Genitourinary Comments: deferred  Musculoskeletal: She exhibits edema.  1+ edema bilaterally to lower calf.  Neurological: She is oriented to person, place, and time.  Skin: Skin is warm and dry. She is not diaphoretic. No erythema.  Psychiatric: She has a normal mood and affect. Her behavior is normal. Judgment and thought content normal.     ED Treatments / Results  Labs (all labs ordered are listed, but only abnormal results are displayed) Labs Reviewed  COMPREHENSIVE METABOLIC PANEL - Abnormal; Notable for the following:       Result Value   Glucose, Bld 106 (*)    BUN  21 (*)    Creatinine, Ser 1.13 (*)    GFR calc non Af Amer 47 (*)    GFR calc Af Amer 54 (*)    All other components within normal limits  CBC WITH DIFFERENTIAL/PLATELET - Abnormal; Notable for the  following:    Hemoglobin 11.7 (*)    RDW 23.5 (*)    All other components within normal limits  TROPONIN I  BRAIN NATRIURETIC PEPTIDE  URINALYSIS, ROUTINE W REFLEX MICROSCOPIC (NOT AT [facility]  I-STAT CG4 LACTIC ACID, ED  I-STAT CG4 LACTIC ACID, ED    EKG  EKG Interpretation  Date/Time:  Saturday July 14 2016 10:41:32 EST Ventricular Rate:  54 PR Interval:    QRS Duration: 89 QT Interval:  425 QTC Calculation: 403 R Axis:   50 Text Interpretation:  Sinus rhythm Consider right atrial enlargement Low voltage, precordial leads Confirmed by Mckena Chern MD, Metta Koranda ([id number]) on 07/14/2016 10:43:58 AM Also confirmed by Gilford Raid MD, Loralye Loberg [phone]), editor [place] CT, Leda Gauze [phone])  on 07/14/2016 10:58:55 AM       Radiology Dg Chest 2 View  Result Date: 07/14/2016 CLINICAL DATA:  Increasing shortness of breath and right chest pain for 2 weeks. History of right lung cancer. Productive cough. EXAM: CHEST  2 VIEW COMPARISON:  CT and radiographs 06/04/2016. FINDINGS: The heart size and mediastinal contours are stable status post CABG. Known right lower lobe nodule is not well visualized, although appears smaller. There is stable biapical scarring. No superimposed airspace disease, pleural effusion, edema or pneumothorax demonstrated. The bones appear stable. IMPRESSION: No acute cardiopulmonary process. Known right lower lobe nodule appears smaller, although is not well visualized. Electronically Signed   By: Richardean Sale M.D.   On: 07/14/2016 11:51   Ct Angio Chest Pe W And/or Wo Contrast  Result Date: 07/14/2016 CLINICAL DATA:  Increasing shortness of breath and right chest pain for 2 weeks. History of right lung cancer. Productive cough. EXAM: CT ANGIOGRAPHY CHEST WITH CONTRAST TECHNIQUE:  Multidetector CT imaging of the chest was performed using the standard protocol during bolus administration of intravenous contrast. Multiplanar CT image reconstructions and MIPs were obtained to evaluate the vascular anatomy. CONTRAST:  80 ml Isovue 370. COMPARISON:  Radiographs today.  CT 06/04/2016. FINDINGS: Cardiovascular: The pulmonary arteries are well opacified with contrast to the level of the subsegmental branches. There is no evidence of acute pulmonary embolism. There is atherosclerosis of the aorta, coronary arteries and great vessels post median sternotomy and CABG. The heart size is stable. There is no pericardial effusion. Mediastinum/Nodes: There are no enlarged mediastinal, hilar or axillary lymph nodes. The thyroid gland, trachea and esophagus demonstrate no significant findings. Lungs/Pleura: There is no pleural effusion. The dominant right lower lobe pulmonary nodule has decreased in size, measuring 7 mm on image 55 (previously 12 mm). Partially calcified nodularity along the superior aspect of the minor fissure is stable (axial image 15). There is stable scarring in the lingula and medially in the left lower lobe. No new or enlarging pulmonary nodules. There is no airspace disease or endobronchial lesion. Upper abdomen: The visualized upper abdomen appears stable without suspicious findings. Musculoskeletal/Chest wall: There is no chest wall mass or suspicious osseous finding. The median sternotomy appears stable. Review of the MIP images confirms the above findings. IMPRESSION: 1. No evidence of acute pulmonary embolism or other acute chest process. 2. Dominant right lower lobe pulmonary nodule has decreased in size, consistent with response to therapy. No evidence of metastatic disease. 3. Atherosclerosis post CABG. Electronically Signed   By: Richardean Sale M.D.   On: 07/14/2016 13:11    Procedures Procedures (including critical care time)  Medications Ordered in ED Medications   iopamidol (ISOVUE-370) 76 % injection 100 mL (80 mLs Intravenous Contrast Given 07/14/16 1229)  Initial Impression / Assessment and Plan / ED Course  I have reviewed the triage vital signs and the nursing notes.  Pertinent labs & imaging results that were available during my care of the patient were reviewed by me and considered in my medical decision making (see chart for details).  Clinical Course    Patient is 75 y.o. female with history of lung cancer, CAD s/p CABG, and CHF presenting with dyspnea, productive cough of white thick sputum and point tenderness at right lower rib. Patient is HDS.Given malignancy history PE must be considered, but no history of leg pain/cramping, normal oxygen saturation on room air, and low-risk according to [facility]' criteria. No evidence of PE on CT. Patient is afebrile with normal white count and negative CT, so not concerned for PNA. EKG is benign and not concerning of ACS. Since productive cough has persisted for ~2 weeks, will provide antibiotics for bronchitis. Advised patient to begin TID '500mg'$  amoxicillin for 7 days if symptoms continue to worsen and toradol for pain control.   Final Clinical Impressions(s) / ED Diagnoses   Final diagnoses:  Acute bronchitis, unspecified organism  Malignant neoplasm of right lung, unspecified part of lung (HCC)    New Prescriptions New Prescriptions   AMOXICILLIN (AMOXIL) 500 MG CAPSULE    Take 1 capsule (500 mg total) by mouth 3 (three) times daily.   KETOROLAC (TORADOL) 10 MG TABLET    Take 1 tablet (10 mg total) by mouth every 6 (six) hours as needed.     Isla Pence, MD 07/14/16 [id number]

## 2016-07-14 NOTE — ED Triage Notes (Signed)
Patient reports increased SOB and "right lung pain" x 2 weeks. Patient reports a history of right lung cancer. Patient reports a productive cough with thick white sputum.

## 2016-07-14 NOTE — ED Notes (Signed)
Patient is alert and oriented x3.  She was given DC instructions and follow up visit instructions.  Patient gave verbal understanding. She was DC ambulatory under her own power to home.  V/S stable.  He was not showing any signs of distress on DC 

## 2016-07-17 ENCOUNTER — Ambulatory Visit: Payer: Self-pay | Admitting: Radiation Oncology

## 2016-07-17 ENCOUNTER — Telehealth: Payer: Self-pay | Admitting: *Deleted

## 2016-07-17 NOTE — Telephone Encounter (Signed)
Initiated PA for [facility] thru [place]. KeyUnknown Jim [id number]  Pt ID: [id number]  Submitted for review. Will await response.

## 2016-07-17 NOTE — Telephone Encounter (Signed)
Will follow up on medication PA

## 2016-07-18 ENCOUNTER — Encounter: Payer: Self-pay | Admitting: Family

## 2016-07-24 ENCOUNTER — Telehealth: Payer: Self-pay | Admitting: Pulmonary Disease

## 2016-07-24 ENCOUNTER — Encounter: Payer: Self-pay | Admitting: Pulmonary Disease

## 2016-07-24 MED ORDER — MOMETASONE FURO-FORMOTEROL FUM 100-5 MCG/ACT IN AERO: 2.0000 | INHALATION_SPRAY | Freq: Two times a day (BID) | RESPIRATORY_TRACT | 0 refills | Status: DC

## 2016-07-24 NOTE — Telephone Encounter (Signed)
Per SN---  Since her insurance will not cover the dulera and we are not able to keep samples of the dulera very often---we would like to change her over to symbicort 160  2 puff bid---SN feels that his medication will work just as well for her.    Called and spoke with pt to make her aware and she stated that she has glaucoma and she is not able to take the symbicort.  SN please advise. Thanks.

## 2016-07-24 NOTE — Progress Notes (Signed)
Mrs. 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  Weight changes, if any: Wt Readings from Last 3 Encounters:  07/31/16 172 lb (78 kg)  07/25/16 173 lb (78.5 kg)  06/18/16 173 lb 4 oz (78.6 kg)   Respiratory complaints, if any: Coughing white secretions,denies SOB Hemoptysis, if any: None Swallowing Problems/Pain/Difficulty swallowing:None Smoking Tobacco/Marijuana/Snuff/ETOH use:Former smoker x 30 years quit 08-13-2000 no drug or alcohol usuage Appetite : Fair does not eat much. Pain: None Lab work from of chart:07-14-16 CBC w diff, Cmet, 07-14-16 CXR,Ct angiography chest PE w  wo contrast BP (!) 153/71   Pulse 63   Temp 98.1 F (36.7 C) (Oral)   Resp 18   Ht '5\' 2"'$  (1.575 m)   Wt 172 lb (78 kg)   SpO2 97%   BMI 31.46 kg/m

## 2016-07-24 NOTE — Telephone Encounter (Signed)
See phone note from 12/12.

## 2016-07-24 NOTE — Telephone Encounter (Signed)
PA for dulera was denied. I spoke with [name] who states covered alternatives are advair or symbicort. Pt has requested samples of dulera, I have left pt two samples up front to be pick up. Pt aware and voiced understanding.    SN please advise on alternatives. Thanks

## 2016-07-25 ENCOUNTER — Ambulatory Visit (INDEPENDENT_AMBULATORY_CARE_PROVIDER_SITE_OTHER): Payer: Medicare HMO | Admitting: Family

## 2016-07-25 ENCOUNTER — Encounter: Payer: Self-pay | Admitting: Family

## 2016-07-25 ENCOUNTER — Ambulatory Visit (HOSPITAL_COMMUNITY)
Admission: RE | Admit: 2016-07-25 | Discharge: 2016-07-25 | Disposition: A | Discharge status: Home / Self Care | Payer: Medicare HMO | Source: Ambulatory Visit | Attending: Family | Admitting: Family

## 2016-07-25 VITALS — BP 140/54 | HR 56 | Temp 97.8°F | Resp 18 | Wt 173.0 lb

## 2016-07-25 DIAGNOSIS — I6521 Occlusion and stenosis of right carotid artery: Secondary | ICD-10-CM

## 2016-07-25 DIAGNOSIS — I771 Stricture of artery: Secondary | ICD-10-CM | POA: Diagnosis not present

## 2016-07-25 DIAGNOSIS — Z95828 Presence of other vascular implants and grafts: Secondary | ICD-10-CM

## 2016-07-25 DIAGNOSIS — I6523 Occlusion and stenosis of bilateral carotid arteries: Secondary | ICD-10-CM

## 2016-07-25 DIAGNOSIS — Z48812 Encounter for surgical aftercare following surgery on the circulatory system: Secondary | ICD-10-CM

## 2016-07-25 DIAGNOSIS — Z9889 Other specified postprocedural states: Secondary | ICD-10-CM

## 2016-07-25 LAB — VAS US CAROTID
LCCAPDIAS: 16 cm/s
LEFT ECA DIAS: -11 cm/s
LEFT VERTEBRAL DIAS: 14 cm/s
LICAPDIAS: -25 cm/s
LICAPSYS: -107 cm/s
Left CCA dist dias: -11 cm/s
Left CCA dist sys: -56 cm/s
Left CCA prox sys: 94 cm/s
Left ICA dist dias: -23 cm/s
Left ICA dist sys: -108 cm/s
RCCAPDIAS: 8 cm/s
RIGHT ECA DIAS: -12 cm/s
RIGHT VERTEBRAL DIAS: -11 cm/s
Right CCA prox sys: 89 cm/s
Right cca dist sys: -140 cm/s

## 2016-07-25 NOTE — Patient Instructions (Addendum)
Stroke Prevention Some medical conditions and behaviors are associated with an increased chance of having a stroke. You may prevent a stroke by making healthy choices and managing medical conditions. How can I reduce my risk of having a stroke?  Stay physically active. Get at least 30 minutes of activity on most or all days.  Do not smoke. It may also be helpful to avoid exposure to secondhand smoke.  Limit alcohol use. Moderate alcohol use is considered to be:  No more than 2 drinks per day for men.  No more than 1 drink per day for nonpregnant women.  Eat healthy foods. This involves:  Eating 5 or more servings of fruits and vegetables a day.  Making dietary changes that address high blood pressure (hypertension), high cholesterol, diabetes, or obesity.  Manage your cholesterol levels.  Making food choices that are high in fiber and low in saturated fat, trans fat, and cholesterol may control cholesterol levels.  Take any prescribed medicines to control cholesterol as directed by your health care provider.  Manage your diabetes.  Controlling your carbohydrate and sugar intake is recommended to manage diabetes.  Take any prescribed medicines to control diabetes as directed by your health care provider.  Control your hypertension.  Making food choices that are low in salt (sodium), saturated fat, trans fat, and cholesterol is recommended to manage hypertension.  Ask your health care provider if you need treatment to lower your blood pressure. Take any prescribed medicines to control hypertension as directed by your health care provider.  If you are 18-39 years of age, have your blood pressure checked every 3-5 years. If you are 40 years of age or older, have your blood pressure checked every year.  Maintain a healthy weight.  Reducing calorie intake and making food choices that are low in sodium, saturated fat, trans fat, and cholesterol are recommended to manage  weight.  Stop drug abuse.  Avoid taking birth control pills.  Talk to your health care provider about the risks of taking birth control pills if you are over 35 years old, smoke, get migraines, or have ever had a blood clot.  Get evaluated for sleep disorders (sleep apnea).  Talk to your health care provider about getting a sleep evaluation if you snore a lot or have excessive sleepiness.  Take medicines only as directed by your health care provider.  For some people, aspirin or blood thinners (anticoagulants) are helpful in reducing the risk of forming abnormal blood clots that can lead to stroke. If you have the irregular heart rhythm of atrial fibrillation, you should be on a blood thinner unless there is a good reason you cannot take them.  Understand all your medicine instructions.  Make sure that other conditions (such as anemia or atherosclerosis) are addressed. Get help right away if:  You have sudden weakness or numbness of the face, arm, or leg, especially on one side of the body.  Your face or eyelid droops to one side.  You have sudden confusion.  You have trouble speaking (aphasia) or understanding.  You have sudden trouble seeing in one or both eyes.  You have sudden trouble walking.  You have dizziness.  You have a loss of balance or coordination.  You have a sudden, severe headache with no known cause.  You have new chest pain or an irregular heartbeat. Any of these symptoms may represent a serious problem that is an emergency. Do not wait to see if the symptoms will go away.   Get medical help at once. Call your local emergency services (911 in [place] Do not drive yourself to the hospital.  This information is not intended to replace advice given to you by your health care provider. Make sure you discuss any questions you have with your health care provider. Document Released: 09/06/2004 Document Revised: 01/05/2016 Document Reviewed: 01/30/2013 Elsevier  Interactive Patient Education  2017 [place].     Before your next abdominal ultrasound:  Take two Extra-Strength Gas-X capsules at bedtime the night before the test. Take another two Extra-Strength Gas-X capsules 3 hours before the test.

## 2016-07-25 NOTE — Progress Notes (Signed)
Chief Complaint: Follow up Extracranial Carotid Artery Stenosis   History of Present Illness  [name] is a 75 y.o. female patient of Dr. Scot Dock who is s/p left carotid endarterectomy in August of 2011. We have been following a moderate right carotid stenosis. She comes in for a yearly follow up visit.   The patient has no history of TIA or stroke symptoms.Specifically the patient denies a history of amaurosis fugax or monocular blindness, unilateral facial drooping, hemiplegia, or receptive or expressive aphasia.   She denies claudication type symptoms in her legs with walking, denies non healing wounds. She does c/o left hip pain after walking about 5 minutes, relieved by rest; she indicates this is worsening.    She does complain that her legs feel wobbly when she walks, feels the instability comes from both hips.   She has had a syncopal episode in 1998, lost vision temporarily, and May 2015 she had a similar pre-syncopal episode; she continues to have recurring dizziness. Patient denies that dizziness is worse with raising arms above head. Patient denies tingling, numbness, or weakness in either hand or arm.  Has been seeing Dr. Jannifer Franklin s/p craniotomy for Arnold-Chiari malformation. Had an MRI and MRA of her head, requested by Dr. Estanislado Pandy, interventional radiologist, per pt. In February or March 2016 due to pt's son, brother, and sister having cerebral aneurysms. Son had ruptured cerebral aneurysm and stroke in September 2015. Was hospitalized at [facility] Dec., 2014 for dizziness, falling, evaluation was inconclusive per pt.  Pt indicates that she had bilateral SFA stents placed about 2003 by Dr. Scot Dock, this surgery is not scanned into online files, will request paper records.   Pt Diabetic: No Pt smoker: non-smoker  Pt meds include: Statin : Yes ASA: Yes, 81 mg Other anticoagulants/antiplatelets: Plavix    Past Medical History:  Diagnosis Date  . 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 Arnold-Chiari malformation ([place]) 1998  . Asthma   . 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  . Carotid artery occlusion   . Complication of anesthesia   . COPD (chronic obstructive pulmonary disease) ([place])   . Coronary artery disease   . 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 Diverticulitis   . Gastroesophageal reflux disease   . Glaucoma   . Hiatal hernia   . Hyperlipidemia   . Hypertension   . Iliac artery aneurysm ([place])   . 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 Myocardial infarction 01/01/2000   Cardiac catheterization  . Peripheral vascular disease ([place])   . PONV (postoperative nausea and vomiting)    occassionally  . Reflux     Social History Social History  Substance Use Topics  . Smoking status: Former Smoker    Packs/day: 1.50    Years: 30.00    Types: Cigarettes    Quit date: 08/13/2000  . Smokeless tobacco: Never Used  . Alcohol use No    Family History Family History  Problem Relation Age of Onset  . Heart disease Mother     Heart Disease before age 48  . Hypertension Mother   . Hyperlipidemia Mother   . Heart attack Mother   . Clotting disorder Mother   . Heart disease Father     Heart Disease before age 69  . Heart attack Father   . Hyperlipidemia Father   . Hypertension Father   . Heart disease Brother     Heart  Disease before age 44  . Hyperlipidemia Brother   . Hypertension Brother   . Clotting disorder Brother   . AAA (abdominal aortic aneurysm) Brother   . Cerebral aneurysm Sister   . Hypertension Sister   . AAA (abdominal aortic aneurysm) Sister   . Asthma Sister   . Cerebral aneurysm Brother   . Cancer Brother     Lung  . Hypertension Brother   . Heart attack Brother   . Heart disease Brother     Aneurysm of Brain  . Hypertension Brother   . Heart disease Brother   . Heart disease Brother   . Stroke Son     Aneurysm of Stomach  . AAA  (abdominal aortic aneurysm) Son   . Cancer Maternal Uncle     great uncle/cancer/type unknown    Surgical History Past Surgical History:  Procedure Laterality Date  . ABDOMINAL HYSTERECTOMY    . APPENDECTOMY    . Arnold-chiari malformation repair  1998   Suboccipital craniectomy  . CAROTID ENDARTERECTOMY  03/29/2010   Left  CEA  . CHOLECYSTECTOMY     Gall Bladder  . COLONOSCOPY WITH PROPOFOL N/A 04/22/2015   Procedure: COLONOSCOPY WITH PROPOFOL;  Surgeon: Carol Ada, MD;  Location: [facility];  Service: Endoscopy;  Laterality: N/A;  . COLONOSCOPY WITH PROPOFOL N/A 05/25/2016   Procedure: COLONOSCOPY WITH PROPOFOL;  Surgeon: Carol Ada, MD;  Location: [facility];  Service: Endoscopy;  Laterality: N/A;  . CORNEAL TRANSPLANT     Right  . CORONARY ARTERY BYPASS GRAFT  01/01/2000  . ESOPHAGOGASTRODUODENOSCOPY N/A 05/25/2016   Procedure: ESOPHAGOGASTRODUODENOSCOPY (EGD);  Surgeon: Carol Ada, MD;  Location: [facility];  Service: Endoscopy;  Laterality: N/A;  . EYE SURGERY     Laser surgery for retinal hemorrhage  . LEFT HEART CATHETERIZATION WITH CORONARY ANGIOGRAM N/A 08/03/2014   Procedure: LEFT HEART CATHETERIZATION WITH CORONARY ANGIOGRAM;  Surgeon: Birdie Riddle, MD;  Location: [facility];  Service: Cardiovascular;  Laterality: N/A;  . Post Coronary Artery  BPG  01/05/2000   Right jugular sheath removed  . PR VEIN BYPASS GRAFT,AORTO-FEM-POP    . ROTATOR CUFF REPAIR     Right    Allergies  Allergen Reactions  . Hydromorphone Palpitations and Other (See Comments)    DILAUDID  -  Pt had a Heart Attack after taking Dilaudid.  . Levaquin [Levofloxacin] Other (See Comments)    Chest pressure, SOB, "pain in between shoulder blades", sweaty -as reported by patient per experience in ED this afternoon  . Codeine Other (See Comments)    Dr. Terrence Dupont advised patient not to take this medication  . Doxycycline Swelling    Mouth, lips, feet swelling  . Oxycodone-Acetaminophen  Other (See Comments)    Says it makes her feel weird  . Risedronate Other (See Comments)    Chest pain  . Avelox [Moxifloxacin Hcl In Nacl] Palpitations    Current Outpatient Prescriptions  Medication Sig Dispense Refill  . albuterol (VENTOLIN HFA) 108 (90 BASE) MCG/ACT inhaler Inhale 2 puffs into the lungs every 4 (four) hours as needed for wheezing or shortness of breath (((PLAN B))). 1 Inhaler 11  . amLODipine-olmesartan (AZOR) 5-40 MG tablet Take 1 tablet by mouth daily.    Marland Kitchen amoxicillin (AMOXIL) 500 MG capsule Take 1 capsule (500 mg total) by mouth 3 (three) times daily. 21 capsule 0  . aspirin EC 81 MG tablet Take 81 mg by mouth daily.     . cloNIDine (CATAPRES) 0.1 MG tablet  Take 0.05 mg by mouth 2 (two) times daily.     . clopidogrel (PLAVIX) 75 MG tablet Take 1 tablet (75 mg total) by mouth daily. 90 tablet 3  . dexlansoprazole (DEXILANT) 60 MG capsule Take 1 capsule (60 mg total) by mouth daily before breakfast. 30 capsule 11  . ipratropium-albuterol (DUONEB) 0.5-2.5 (3) MG/3ML SOLN Take 3 mLs by nebulization 4 (four) times daily. 360 mL 0  . ketorolac (TORADOL) 10 MG tablet Take 1 tablet (10 mg total) by mouth every 6 (six) hours as needed. 10 tablet 0  . mometasone-formoterol (DULERA) 100-5 MCG/ACT AERO Inhale 2 puffs into the lungs 2 (two) times daily. 1 Inhaler 5  . mometasone-formoterol (DULERA) 100-5 MCG/ACT AERO Inhale 2 puffs into the lungs 2 (two) times daily. 1 Inhaler 0  . nitroGLYCERIN (NITROSTAT) 0.4 MG SL tablet Place 0.4 mg under the tongue every 5 (five) minutes as needed for chest pain.     . prednisoLONE acetate (PRED FORTE) 1 % ophthalmic suspension Place 1 drop into the right eye every other day.     . rosuvastatin (CRESTOR) 10 MG tablet Take 10 mg by mouth at bedtime.      No current facility-administered medications for this visit.     Review of Systems : See HPI for pertinent positives and negatives.  Physical Examination  Vitals:   07/25/16 0850  BP:  (!) 140/54  Pulse: (!) 56  Resp: 18  Temp: 97.8 F (36.6 C)  SpO2: 98%  Weight: 173 lb (78.5 kg)   Body mass index is 31.64 kg/m.  General: WDWN obese female in NAD GAIT: normal Eyes: PERRLA Pulmonary: Respirations are non-labored, CTAB, no rales,  rhonchi, or wheezing.  Cardiac: regular rhythm, no detected murmur.  VASCULAR EXAM Carotid Bruits Left Right   negative positive   Aorta is not palpable. Radial pulses are 1+ right and 2+ left palpable.      LE Pulses LEFT RIGHT       FEMORAL 1+ palpable 2+ palpable   POPLITEAL not palpable  not palpable   POSTERIOR TIBIAL not palpable  not palpable    DORSALIS PEDIS  ANTERIOR TIBIAL 2+ palpable  2+ palpable     Gastrointestinal: soft, nontender, BS WNL, no r/g,no palpable masses.  Musculoskeletal: No muscle atrophy/wasting. M/S 5/5 in UE's, 4/5 in LE's, Extremities without ischemic changes.  Neurologic: A&O X 3; Appropriate Affect, Speech is normal CN 2-12 intact, Pain and light touch intact in extremities, Motor exam as listed above     Assessment: ASHAUNTE STANDLEY is a 75 y.o. female who is s/p left carotid endarterectomy in August of 2011. She has no history of stroke or TIA.   She reports worsening pain with walking in her left hip. She reports a hx of bilateral SFA stents placed by Dr. Scot Dock; I discussed this with Dr. Scot Dock, see Plan. Old records obtained. Pt did not have SFA stents placed; she had bilateral CIA stents placed and left EIA stent placed on 04-26-2008 by Dr. Scot Dock  DATA Today's carotid duplex suggests 40-59% right ICA stenosis and a patent left CEA site with no restenosis.   Bilateral vertebral artery flow is antegrade.  Bilateral subclavian artery waveforms are normal.  No significant change  compared to the last exam on 07-20-15.  Plan: Follow-up in 1 year with Carotid Duplex scan, bilateral aortoiliac duplex, and ABI's. I advised pt to notify us if her left hip pain worsens to the point where she would like the testing  done sooner (bilateral aortoiliac duplex). Pt will see Dr. Scot Dock when she returns.   I discussed in depth with the patient the nature of atherosclerosis, and emphasized the importance of maximal medical management including strict control of blood pressure, blood glucose, and lipid levels, obtaining regular exercise, and continued cessation of smoking.  The patient is aware that without maximal medical management the underlying atherosclerotic disease process will progress, limiting the benefit of any interventions. The patient was given information about stroke prevention and what symptoms should prompt the patient to seek immediate medical care. Thank you for allowing [place] to participate in this patient's care.  Clemon Chambers, RN, MSN, FNP-C [facility] Office: [phone]  Clinic Physician: Scot Dock  07/25/16 8:54 AM

## 2016-07-30 MED ORDER — FLUTICASONE-SALMETEROL 115-21 MCG/ACT IN AERO: 2.0000 | INHALATION_SPRAY | Freq: Two times a day (BID) | RESPIRATORY_TRACT | 12 refills | Status: DC

## 2016-07-30 NOTE — Telephone Encounter (Signed)
Per SN---   Her insurance will not cover the dulera.  Her insurance will cover the advair or the symbicort.  SN stated that she can use either one of these and have her eye doctor recheck her in a few months to make sure there have been no changes.

## 2016-07-30 NOTE — Telephone Encounter (Signed)
SN  Please advise--  Spoke with pt. And she is requesting to take the Advair because she is concerned about the Symbicort and her having glaucoma.    [name], CMA      2:19 PM  Note    Per SN---   Her insurance will not cover the [place].  Her insurance will cover the advair or the symbicort.  SN stated that she can use either one of these and have her eye doctor recheck her in a few months to make sure there have been no changes.

## 2016-07-30 NOTE — Telephone Encounter (Signed)
Per SN:  can try Advair HFA 115/21, 2 sprays BID , rinse mouth after each use   Pt aware. Rx sent to [facility]. Nothing further needed.

## 2016-07-31 ENCOUNTER — Ambulatory Visit
Admission: RE | Admit: 2016-07-31 | Discharge: 2016-07-31 | Disposition: A | Discharge status: Home / Self Care | Payer: Medicare HMO | Source: Ambulatory Visit | Attending: Radiation Oncology | Admitting: Radiation Oncology

## 2016-07-31 ENCOUNTER — Encounter: Payer: Self-pay | Admitting: Radiation Oncology

## 2016-07-31 VITALS — BP 153/71 | HR 63 | Temp 98.1°F | Resp 18 | Ht 62.0 in | Wt 172.0 lb

## 2016-07-31 DIAGNOSIS — C3431 Malignant neoplasm of lower lobe, right bronchus or lung: Secondary | ICD-10-CM | POA: Insufficient documentation

## 2016-07-31 DIAGNOSIS — Z951 Presence of aortocoronary bypass graft: Secondary | ICD-10-CM | POA: Insufficient documentation

## 2016-07-31 DIAGNOSIS — I700 Atherosclerosis of aorta: Secondary | ICD-10-CM | POA: Diagnosis not present

## 2016-07-31 DIAGNOSIS — R1031 Right lower quadrant pain: Secondary | ICD-10-CM | POA: Insufficient documentation

## 2016-07-31 DIAGNOSIS — D509 Iron deficiency anemia, unspecified: Secondary | ICD-10-CM | POA: Diagnosis not present

## 2016-07-31 DIAGNOSIS — Z923 Personal history of irradiation: Secondary | ICD-10-CM | POA: Diagnosis not present

## 2016-07-31 DIAGNOSIS — Z7982 Long term (current) use of aspirin: Secondary | ICD-10-CM | POA: Insufficient documentation

## 2016-07-31 DIAGNOSIS — I2510 Atherosclerotic heart disease of native coronary artery without angina pectoris: Secondary | ICD-10-CM | POA: Diagnosis not present

## 2016-07-31 NOTE — Addendum Note (Signed)
Addended by: Lianne Cure A on: 07/31/2016 10:59 AM   Modules accepted: Orders

## 2016-07-31 NOTE — Addendum Note (Signed)
Encounter addended by: Malena Edman, RN on: 07/31/2016 12:32 PM<BR>    Actions taken: Charge Capture section accepted

## 2016-07-31 NOTE — Progress Notes (Signed)
Radiation Oncology         [phone] ________________________________  Name: [name] MRN: [id number]  Date: 07/31/2016  DOB: 04-16-1941  Post Treatment Note  CC: [name], MD  [name], MD  Diagnosis:   Stage IA, T1aN0M0 NCSLC, squamous cell carcinoma of the right lower lobe  Interval Since Last Radiation:  9 weeks   05/21/16- 05/28/16 SBRT Treatment: The target was treated to 54 Gy in 3 fractions of 18 Gy  Narrative:  The patient returns today for routine follow-up. The patient tolerated her radiotherapy treatments very well. She did however present on 07/14/16 to the emergency department complaining of shortness of breath. A chest x-ray did not reveal any concerns for cardiomegaly, or pneumonia, and a CT PA was performed as well which did not reveal evidence of pulmonary embolism, but did reveal improvement of her right lower lobe lesion that was treated with radiation, it previously measured 12 mm and is currently 7 mm in size. No evidence of pneumonitis was present. She was diagnosed as having bronchitis and was given amoxicillin.                           On review of systems, the patient states she is doing very well since her antibiotics completed. She states that she is having some thin white secretions when coughing but denies any fevers or shortness of breath. She states that she is following up with her cardiologist next month, and also follows up regularly with pulmonary. She denies any abdominal pain, nausea or vomiting. She denies any bowel or bladder disturbances. She has noticed in the last couple of days 8 pain in her right groin that's been present in the evening. She denies any specific activity that brings this on, and is unable to characterize the cystic exactly. She states that all she knows that it makes it so uncomfortable for her to walk that she has to use a cane. She states that this has not bothered her today. No other complaints or  verbalized.  ALLERGIES:  is allergic to hydromorphone; levaquin [levofloxacin]; codeine; doxycycline; oxycodone-acetaminophen; risedronate; and avelox [moxifloxacin hcl in nacl].  Meds: Current Outpatient Prescriptions  Medication Sig Dispense Refill  . amLODipine-olmesartan (AZOR) 5-40 MG tablet Take 1 tablet by mouth daily.    Marland Kitchen aspirin EC 81 MG tablet Take 81 mg by mouth daily.     . cloNIDine (CATAPRES) 0.1 MG tablet Take 0.05 mg by mouth 2 (two) times daily.     . clopidogrel (PLAVIX) 75 MG tablet Take 1 tablet (75 mg total) by mouth daily. 90 tablet 3  . dexlansoprazole (DEXILANT) 60 MG capsule Take 1 capsule (60 mg total) by mouth daily before breakfast. 30 capsule 11  . fluticasone-salmeterol (ADVAIR HFA) 115-21 MCG/ACT inhaler Inhale 2 puffs into the lungs 2 (two) times daily. 1 Inhaler 12  . mometasone-formoterol (DULERA) 100-5 MCG/ACT AERO Inhale 2 puffs into the lungs 2 (two) times daily. 1 Inhaler 5  . prednisoLONE acetate (PRED FORTE) 1 % ophthalmic suspension Place 1 drop into the right eye every other day.     . rosuvastatin (CRESTOR) 10 MG tablet Take 10 mg by mouth at bedtime.     Marland Kitchen acetaminophen (TYLENOL) 500 MG tablet Take 500 mg by mouth every 6 (six) hours as needed.    Marland Kitchen albuterol (VENTOLIN HFA) 108 (90 BASE) MCG/ACT inhaler Inhale 2 puffs into the lungs every 4 (four) hours as needed  for wheezing or shortness of breath (((PLAN B))). (Patient not taking: Reported on 07/31/2016) 1 Inhaler 11  . ipratropium-albuterol (DUONEB) 0.5-2.5 (3) MG/3ML SOLN Take 3 mLs by nebulization 4 (four) times daily. (Patient not taking: Reported on 07/31/2016) 360 mL 0  . ketorolac (TORADOL) 10 MG tablet Take 1 tablet (10 mg total) by mouth every 6 (six) hours as needed. (Patient not taking: Reported on 07/31/2016) 10 tablet 0  . mometasone-formoterol (DULERA) 100-5 MCG/ACT AERO Inhale 2 puffs into the lungs 2 (two) times daily. 1 Inhaler 0  . nitroGLYCERIN (NITROSTAT) 0.4 MG SL tablet Place  0.4 mg under the tongue every 5 (five) minutes as needed for chest pain.      No current facility-administered medications for this encounter.     Physical Findings:  height is '5\' 2"'$  (1.575 m) and weight is 172 lb (78 kg). Her oral temperature is 98.1 F (36.7 C). Her blood pressure is 153/71 (abnormal) and her pulse is 63. Her respiration is 18 and oxygen saturation is 97%.  In general this is a well appearing Caucasian female in no acute distress. She's alert and oriented x4 and appropriate throughout the examination. Cardiopulmonary assessment is negative for acute distress and she exhibits normal effort.   Lab Findings: Lab Results  Component Value Date   WBC 5.6 07/14/2016   HGB 11.7 (L) 07/14/2016   HCT 36.0 07/14/2016   MCV 82.0 07/14/2016   PLT 220 07/14/2016     Radiographic Findings: Dg Chest 2 View  Result Date: 07/14/2016 CLINICAL DATA:  Increasing shortness of breath and right chest pain for 2 weeks. History of right lung cancer. Productive cough. EXAM: CHEST  2 VIEW COMPARISON:  CT and radiographs 06/04/2016. FINDINGS: The heart size and mediastinal contours are stable status post CABG. Known right lower lobe nodule is not well visualized, although appears smaller. There is stable biapical scarring. No superimposed airspace disease, pleural effusion, edema or pneumothorax demonstrated. The bones appear stable. IMPRESSION: No acute cardiopulmonary process. Known right lower lobe nodule appears smaller, although is not well visualized. Electronically Signed   By: Richardean Sale M.D.   On: 07/14/2016 11:51   Ct Angio Chest Pe W And/or Wo Contrast  Result Date: 07/14/2016 CLINICAL DATA:  Increasing shortness of breath and right chest pain for 2 weeks. History of right lung cancer. Productive cough. EXAM: CT ANGIOGRAPHY CHEST WITH CONTRAST TECHNIQUE: Multidetector CT imaging of the chest was performed using the standard protocol during bolus administration of intravenous  contrast. Multiplanar CT image reconstructions and MIPs were obtained to evaluate the vascular anatomy. CONTRAST:  80 ml Isovue 370. COMPARISON:  Radiographs today.  CT 06/04/2016. FINDINGS: Cardiovascular: The pulmonary arteries are well opacified with contrast to the level of the subsegmental branches. There is no evidence of acute pulmonary embolism. There is atherosclerosis of the aorta, coronary arteries and great vessels post median sternotomy and CABG. The heart size is stable. There is no pericardial effusion. Mediastinum/Nodes: There are no enlarged mediastinal, hilar or axillary lymph nodes. The thyroid gland, trachea and esophagus demonstrate no significant findings. Lungs/Pleura: There is no pleural effusion. The dominant right lower lobe pulmonary nodule has decreased in size, measuring 7 mm on image 55 (previously 12 mm). Partially calcified nodularity along the superior aspect of the minor fissure is stable (axial image 15). There is stable scarring in the lingula and medially in the left lower lobe. No new or enlarging pulmonary nodules. There is no airspace disease or endobronchial lesion. Upper abdomen:  The visualized upper abdomen appears stable without suspicious findings. Musculoskeletal/Chest wall: There is no chest wall mass or suspicious osseous finding. The median sternotomy appears stable. Review of the MIP images confirms the above findings. IMPRESSION: 1. No evidence of acute pulmonary embolism or other acute chest process. 2. Dominant right lower lobe pulmonary nodule has decreased in size, consistent with response to therapy. No evidence of metastatic disease. 3. Atherosclerosis post CABG. Electronically Signed   By: Richardean Sale M.D.   On: 07/14/2016 13:11    Impression/Plan: 1. Stage IA, T1aN0M0 NCSLC, squamous cell carcinoma of the right lower lobe. The patient appears to be doing well since completing radiotherapy, and after being treated for her bronchitis. We will follow-up  with a repeat CT scan in 6 months time and see her after the fact. We did discuss precautions to call. She states agreement. 2.  Iron deficiency anemia. The patient will continue to follow up with Dr. Julien Nordmann for management of this. We will follow along peripherally. 3.  Right groin pain. The source of her discomfort is not quite understood at this time, and she is having a hard time quantifying and characterizing his symptoms that she is having. I encouraged her to try and keep nodes about the frequency and kyphosis symptoms that she experiences when this occurs. She will keep her primary care provider informed of this.    Carola Rhine, PAC

## 2016-08-08 ENCOUNTER — Other Ambulatory Visit (HOSPITAL_BASED_OUTPATIENT_CLINIC_OR_DEPARTMENT_OTHER): Payer: Medicare HMO

## 2016-08-08 ENCOUNTER — Encounter: Payer: Self-pay | Admitting: Internal Medicine

## 2016-08-08 ENCOUNTER — Ambulatory Visit (HOSPITAL_BASED_OUTPATIENT_CLINIC_OR_DEPARTMENT_OTHER): Payer: Medicare HMO | Admitting: Internal Medicine

## 2016-08-08 ENCOUNTER — Telehealth: Payer: Self-pay | Admitting: Internal Medicine

## 2016-08-08 VITALS — BP 170/54 | HR 70 | Temp 98.3°F | Resp 18 | Ht 62.0 in | Wt 170.0 lb

## 2016-08-08 DIAGNOSIS — D509 Iron deficiency anemia, unspecified: Secondary | ICD-10-CM

## 2016-08-08 DIAGNOSIS — C3431 Malignant neoplasm of lower lobe, right bronchus or lung: Secondary | ICD-10-CM

## 2016-08-08 DIAGNOSIS — D508 Other iron deficiency anemias: Secondary | ICD-10-CM | POA: Diagnosis not present

## 2016-08-08 LAB — CBC WITH DIFFERENTIAL/PLATELET
BASO%: 0.5 % (ref 0.0–2.0)
BASOS ABS: 0 10*3/uL (ref 0.0–0.1)
EOS ABS: 0.3 10*3/uL (ref 0.0–0.5)
EOS%: 3.5 % (ref 0.0–7.0)
HCT: 37.3 % (ref 34.8–46.6)
HGB: 12.4 g/dL (ref 11.6–15.9)
LYMPH%: 38.8 % (ref 14.0–49.7)
MCH: 27.7 pg (ref 25.1–34.0)
MCHC: 33.2 g/dL (ref 31.5–36.0)
MCV: 83.4 fL (ref 79.5–101.0)
MONO#: 0.6 10*3/uL (ref 0.1–0.9)
MONO%: 7.5 % (ref 0.0–14.0)
NEUT%: 49.7 % (ref 38.4–76.8)
NEUTROS ABS: 3.7 10*3/uL (ref 1.5–6.5)
PLATELETS: 247 10*3/uL (ref 145–400)
RBC: 4.47 10*6/uL (ref 3.70–5.45)
RDW: 21.5 % — ABNORMAL HIGH (ref 11.2–14.5)
WBC: 7.5 10*3/uL (ref 3.9–10.3)
lymph#: 2.9 10*3/uL (ref 0.9–3.3)

## 2016-08-08 LAB — IRON AND TIBC
%SAT: 26 % (ref 21–57)
Iron: 69 ug/dL (ref 41–142)
TIBC: 263 ug/dL (ref 236–444)
UIBC: 193 ug/dL (ref 120–384)

## 2016-08-08 LAB — FERRITIN: Ferritin: 195 ng/ml (ref 9–269)

## 2016-08-08 NOTE — Telephone Encounter (Signed)
Appointments scheduled per 08/08/16 los. Patient was given a copy of the AVS report and appointment schedule, per 08/08/16 los.

## 2016-08-08 NOTE — Progress Notes (Signed)
[place] Telephone:([phone]   Fax:(336) [phone]  OFFICE PROGRESS NOTE  [name], MD [address] [place] [address]  DIAGNOSIS:  1) stage IA non-small cell lung cancer, squamous cell carcinoma presented with right lower lobe pulmonary nodule. 2) persistent anemia questionable for anemia of chronic disease plus/minus iron deficiency.  PRIOR THERAPY:  1) Feraheme infusion last dose was given 06/20/2016. 2) stereotactic radiotherapy to the right lower lobe lung nodule under the care of Dr. Tammi Klippel.  CURRENT THERAPY: Observation.  INTERVAL HISTORY: [name] 75 y.o. female came to the clinic today for follow-up visit accompanied by her niece. The patient continues to complain of shortness of breath with exertion. She denied having any fatigue or weakness. She had significant improvement in her stamina after the Feraheme infusion 2 months ago. She denied having any weight loss or night sweats. She has no fever or chills. She denied having any chest pain but continues to have the shortness of breath with no cough or hemoptysis. The patient denied having any nausea, vomiting, diarrhea or constipation. She had repeat CT scan of the chest performed earlier this month in addition to repeat CBC, iron study and ferritin and she is here for evaluation and discussion of her scan and lab results.  MEDICAL HISTORY: Past Medical History:  Diagnosis Date  . 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 Asthma   . 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  . Carotid artery occlusion   . Complication of anesthesia   . COPD (chronic obstructive pulmonary disease) ([place])   . Coronary artery disease   . 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 Diverticulitis   . Gastroesophageal reflux disease   . Glaucoma   . Hiatal hernia   . Hyperlipidemia   .  Hypertension   . Iliac artery aneurysm ([place])   . 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 Myocardial infarction 01/01/2000   Cardiac catheterization  . Peripheral vascular disease ([place])   . PONV (postoperative nausea and vomiting)    occassionally  . Reflux     ALLERGIES:  is allergic to hydromorphone; levaquin [levofloxacin]; codeine; doxycycline; oxycodone-acetaminophen; risedronate; and avelox [moxifloxacin hcl in nacl].  MEDICATIONS:  Current Outpatient Prescriptions  Medication Sig Dispense Refill  . acetaminophen (TYLENOL) 500 MG tablet Take 500 mg by mouth every 6 (six) hours as needed.    Marland Kitchen albuterol (VENTOLIN HFA) 108 (90 BASE) MCG/ACT inhaler Inhale 2 puffs into the lungs every 4 (four) hours as needed for wheezing or shortness of breath (((PLAN B))). 1 Inhaler 11  . amLODipine-olmesartan (AZOR) 5-40 MG tablet Take 1 tablet by mouth daily.    Marland Kitchen aspirin EC 81 MG tablet Take 81 mg by mouth daily.     . cloNIDine (CATAPRES) 0.1 MG tablet Take 0.05 mg by mouth 2 (two) times daily.     . clopidogrel (PLAVIX) 75 MG tablet Take 1 tablet (75 mg total) by mouth daily. 90 tablet 3  . dexlansoprazole (DEXILANT) 60 MG capsule Take 1 capsule (60 mg total) by mouth daily before breakfast. 30 capsule 11  . mometasone-formoterol (DULERA) 100-5 MCG/ACT AERO Inhale 2 puffs into the lungs 2 (two) times daily. 1 Inhaler 5  . prednisoLONE acetate (PRED FORTE) 1 % ophthalmic suspension Place 1 drop into the right eye every other day.     Marland Kitchen  rosuvastatin (CRESTOR) 10 MG tablet Take 10 mg by mouth at bedtime.     . fluticasone-salmeterol (ADVAIR HFA) 115-21 MCG/ACT inhaler Inhale 2 puffs into the lungs 2 (two) times daily. (Patient not taking: Reported on 08/08/2016) 1 Inhaler 12  . ipratropium-albuterol (DUONEB) 0.5-2.5 (3) MG/3ML SOLN Take 3 mLs by nebulization 4 (four) times daily. (Patient not taking: Reported on 08/08/2016) 360 mL 0  . ketorolac (TORADOL) 10 MG tablet Take 1 tablet (10  mg total) by mouth every 6 (six) hours as needed. (Patient not taking: Reported on 08/08/2016) 10 tablet 0  . nitroGLYCERIN (NITROSTAT) 0.4 MG SL tablet Place 0.4 mg under the tongue every 5 (five) minutes as needed for chest pain.      No current facility-administered medications for this visit.     SURGICAL HISTORY:  Past Surgical History:  Procedure Laterality Date  . ABDOMINAL HYSTERECTOMY    . APPENDECTOMY    . Arnold-chiari malformation repair  1998   Suboccipital craniectomy  . CAROTID ENDARTERECTOMY  03/29/2010   Left  CEA  . CHOLECYSTECTOMY     Gall Bladder  . COLONOSCOPY WITH PROPOFOL N/A 04/22/2015   Procedure: COLONOSCOPY WITH PROPOFOL;  Surgeon: Carol Ada, MD;  Location: [facility];  Service: Endoscopy;  Laterality: N/A;  . COLONOSCOPY WITH PROPOFOL N/A 05/25/2016   Procedure: COLONOSCOPY WITH PROPOFOL;  Surgeon: Carol Ada, MD;  Location: [facility];  Service: Endoscopy;  Laterality: N/A;  . CORNEAL TRANSPLANT     Right  . CORONARY ARTERY BYPASS GRAFT  01/01/2000  . ESOPHAGOGASTRODUODENOSCOPY N/A 05/25/2016   Procedure: ESOPHAGOGASTRODUODENOSCOPY (EGD);  Surgeon: Carol Ada, MD;  Location: [facility];  Service: Endoscopy;  Laterality: N/A;  . EYE SURGERY     Laser surgery for retinal hemorrhage  . LEFT HEART CATHETERIZATION WITH CORONARY ANGIOGRAM N/A 08/03/2014   Procedure: LEFT HEART CATHETERIZATION WITH CORONARY ANGIOGRAM;  Surgeon: Birdie Riddle, MD;  Location: [facility];  Service: Cardiovascular;  Laterality: N/A;  . Post Coronary Artery  BPG  01/05/2000   Right jugular sheath removed  . PR VEIN BYPASS GRAFT,AORTO-FEM-POP    . ROTATOR CUFF REPAIR     Right    REVIEW OF SYSTEMS:  Constitutional: negative Eyes: negative Ears, nose, mouth, throat, and face: negative Respiratory: positive for dyspnea on exertion Cardiovascular: negative Gastrointestinal: negative Genitourinary:negative Integument/breast: negative Hematologic/lymphatic:  negative Musculoskeletal:negative Neurological: negative Behavioral/Psych: negative Endocrine: negative Allergic/Immunologic: negative   PHYSICAL EXAMINATION: General appearance: alert, cooperative and no distress Head: Normocephalic, without obvious abnormality, atraumatic Neck: no adenopathy, no JVD, supple, symmetrical, trachea midline and thyroid not enlarged, symmetric, no tenderness/mass/nodules Lymph nodes: Cervical, supraclavicular, and axillary nodes normal. Resp: wheezes bilaterally Back: symmetric, no curvature. ROM normal. No CVA tenderness. Cardio: regular rate and rhythm, S1, S2 normal, no murmur, click, rub or gallop GI: soft, non-tender; bowel sounds normal; no masses,  no organomegaly Extremities: extremities normal, atraumatic, no cyanosis or edema Neurologic: Alert and oriented X 3, normal strength and tone. Normal symmetric reflexes. Normal coordination and gait  ECOG PERFORMANCE STATUS: 1 - Symptomatic but completely ambulatory  Blood pressure (!) 170/54, pulse 70, temperature 98.3 F (36.8 C), temperature source Oral, resp. rate 18, height '5\' 2"'$  (1.575 m), weight 170 lb (77.1 kg), SpO2 100 %.  LABORATORY DATA: Lab Results  Component Value Date   WBC 7.5 08/08/2016   HGB 12.4 08/08/2016   HCT 37.3 08/08/2016   MCV 83.4 08/08/2016   PLT 247 08/08/2016      Chemistry  Component Value Date/Time   NA 136 07/14/2016 1100   NA 141 05/14/2016 1541   K 4.5 07/14/2016 1100   K 4.1 05/14/2016 1541   CL 105 07/14/2016 1100   CO2 22 07/14/2016 1100   CO2 23 05/14/2016 1541   BUN 21 (H) 07/14/2016 1100   BUN 21.5 05/14/2016 1541   CREATININE 1.13 (H) 07/14/2016 1100   CREATININE 1.2 (H) 05/14/2016 1541      Component Value Date/Time   CALCIUM 9.2 07/14/2016 1100   CALCIUM 9.7 05/14/2016 1541   ALKPHOS 76 07/14/2016 1100   ALKPHOS 92 05/14/2016 1541   AST 25 07/14/2016 1100   AST 16 05/14/2016 1541   ALT 14 07/14/2016 1100   ALT 10 05/14/2016 1541    BILITOT 0.6 07/14/2016 1100   BILITOT <0.30 05/14/2016 1541       RADIOGRAPHIC STUDIES: Dg Chest 2 View  Result Date: 07/14/2016 CLINICAL DATA:  Increasing shortness of breath and right chest pain for 2 weeks. History of right lung cancer. Productive cough. EXAM: CHEST  2 VIEW COMPARISON:  CT and radiographs 06/04/2016. FINDINGS: The heart size and mediastinal contours are stable status post CABG. Known right lower lobe nodule is not well visualized, although appears smaller. There is stable biapical scarring. No superimposed airspace disease, pleural effusion, edema or pneumothorax demonstrated. The bones appear stable. IMPRESSION: No acute cardiopulmonary process. Known right lower lobe nodule appears smaller, although is not well visualized. Electronically Signed   By: Richardean Sale M.D.   On: 07/14/2016 11:51   Ct Angio Chest Pe W And/or Wo Contrast  Result Date: 07/14/2016 CLINICAL DATA:  Increasing shortness of breath and right chest pain for 2 weeks. History of right lung cancer. Productive cough. EXAM: CT ANGIOGRAPHY CHEST WITH CONTRAST TECHNIQUE: Multidetector CT imaging of the chest was performed using the standard protocol during bolus administration of intravenous contrast. Multiplanar CT image reconstructions and MIPs were obtained to evaluate the vascular anatomy. CONTRAST:  80 ml Isovue 370. COMPARISON:  Radiographs today.  CT 06/04/2016. FINDINGS: Cardiovascular: The pulmonary arteries are well opacified with contrast to the level of the subsegmental branches. There is no evidence of acute pulmonary embolism. There is atherosclerosis of the aorta, coronary arteries and great vessels post median sternotomy and CABG. The heart size is stable. There is no pericardial effusion. Mediastinum/Nodes: There are no enlarged mediastinal, hilar or axillary lymph nodes. The thyroid gland, trachea and esophagus demonstrate no significant findings. Lungs/Pleura: There is no pleural effusion. The  dominant right lower lobe pulmonary nodule has decreased in size, measuring 7 mm on image 55 (previously 12 mm). Partially calcified nodularity along the superior aspect of the minor fissure is stable (axial image 15). There is stable scarring in the lingula and medially in the left lower lobe. No new or enlarging pulmonary nodules. There is no airspace disease or endobronchial lesion. Upper abdomen: The visualized upper abdomen appears stable without suspicious findings. Musculoskeletal/Chest wall: There is no chest wall mass or suspicious osseous finding. The median sternotomy appears stable. Review of the MIP images confirms the above findings. IMPRESSION: 1. No evidence of acute pulmonary embolism or other acute chest process. 2. Dominant right lower lobe pulmonary nodule has decreased in size, consistent with response to therapy. No evidence of metastatic disease. 3. Atherosclerosis post CABG. Electronically Signed   By: Richardean Sale M.D.   On: 07/14/2016 13:11    ASSESSMENT AND PLAN:  This is a very pleasant 75 years old white female with the  following medical conditions. 1) stage IA non-small cell lung cancer, squamous cell carcinoma presented with right lower lobe pulmonary nodule status post stereotactic radiotherapy. The recent CT angiogram of the chest showed no evidence for pulmonary embolism in the dominant right lower lobe pulmonary nodule had decreased in size consistent with response to the treatment. I discussed the scan results with the patient and her niece today. I recommended for her to continue on observation with repeat CT scan of the chest in 6 months. 2) iron deficiency anemia: Significantly improved after the patient received Feraheme infusion. Her CBC, iron study and ferritin are all within the normal range. I recommended for the patient to continue on observation. She would have repeat CBC, iron study and ferritin in 6 months. 3) hypertension: Her blood pressure is high today.  She is currently on amlodipine and clonidine. She did not take her blood pressure medication earlier today. I strongly encouraged the patient to take her blood pressure medication as ordered. She will consult with her primary care physician if no improvement of her blood pressure. The patient would come back for follow-up visit in 6 months. She was advised to call immediately if she has any concerning symptoms in the interval.  All questions were answered. The patient knows to call the clinic with any problems, questions or concerns. We can certainly see the patient much sooner if necessary.  Disclaimer: This note was dictated with voice recognition software. Similar sounding words can inadvertently be transcribed and may not be corrected upon review.

## 2016-08-16 ENCOUNTER — Telehealth (HOSPITAL_COMMUNITY): Payer: Self-pay

## 2016-08-16 NOTE — Telephone Encounter (Signed)
Pt agreed to f/u in 6 months with us carotid. AW 

## 2016-08-21 ENCOUNTER — Other Ambulatory Visit: Payer: Self-pay | Admitting: *Deleted

## 2016-08-21 NOTE — Telephone Encounter (Signed)
In CMM this pt's [name] has been denied. 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

## 2016-09-15 ENCOUNTER — Encounter (HOSPITAL_COMMUNITY): Payer: Self-pay | Admitting: Emergency Medicine

## 2016-09-15 ENCOUNTER — Observation Stay (HOSPITAL_COMMUNITY): Payer: Medicare HMO

## 2016-09-15 ENCOUNTER — Observation Stay (HOSPITAL_COMMUNITY)
Admission: EM | Admit: 2016-09-15 | Discharge: 2016-09-17 | Disposition: A | Discharge status: Home / Self Care | Payer: Medicare HMO | Attending: Internal Medicine | Admitting: Internal Medicine

## 2016-09-15 DIAGNOSIS — Z7982 Long term (current) use of aspirin: Secondary | ICD-10-CM | POA: Diagnosis not present

## 2016-09-15 DIAGNOSIS — K922 Gastrointestinal hemorrhage, unspecified: Secondary | ICD-10-CM | POA: Diagnosis not present

## 2016-09-15 DIAGNOSIS — K921 Melena: Secondary | ICD-10-CM | POA: Insufficient documentation

## 2016-09-15 DIAGNOSIS — I252 Old myocardial infarction: Secondary | ICD-10-CM | POA: Diagnosis not present

## 2016-09-15 DIAGNOSIS — J449 Chronic obstructive pulmonary disease, unspecified: Secondary | ICD-10-CM | POA: Insufficient documentation

## 2016-09-15 DIAGNOSIS — R1031 Right lower quadrant pain: Secondary | ICD-10-CM

## 2016-09-15 DIAGNOSIS — C3431 Malignant neoplasm of lower lobe, right bronchus or lung: Secondary | ICD-10-CM | POA: Diagnosis present

## 2016-09-15 DIAGNOSIS — K219 Gastro-esophageal reflux disease without esophagitis: Secondary | ICD-10-CM | POA: Diagnosis present

## 2016-09-15 DIAGNOSIS — Z7902 Long term (current) use of antithrombotics/antiplatelets: Secondary | ICD-10-CM | POA: Diagnosis not present

## 2016-09-15 DIAGNOSIS — I2510 Atherosclerotic heart disease of native coronary artery without angina pectoris: Secondary | ICD-10-CM | POA: Diagnosis not present

## 2016-09-15 DIAGNOSIS — K625 Hemorrhage of anus and rectum: Secondary | ICD-10-CM | POA: Diagnosis not present

## 2016-09-15 DIAGNOSIS — I739 Peripheral vascular disease, unspecified: Secondary | ICD-10-CM | POA: Diagnosis not present

## 2016-09-15 DIAGNOSIS — Z87891 Personal history of nicotine dependence: Secondary | ICD-10-CM | POA: Diagnosis not present

## 2016-09-15 DIAGNOSIS — J209 Acute bronchitis, unspecified: Secondary | ICD-10-CM | POA: Diagnosis not present

## 2016-09-15 DIAGNOSIS — R195 Other fecal abnormalities: Secondary | ICD-10-CM | POA: Diagnosis present

## 2016-09-15 DIAGNOSIS — Z79899 Other long term (current) drug therapy: Secondary | ICD-10-CM | POA: Diagnosis not present

## 2016-09-15 DIAGNOSIS — Z951 Presence of aortocoronary bypass graft: Secondary | ICD-10-CM | POA: Insufficient documentation

## 2016-09-15 DIAGNOSIS — Z85118 Personal history of other malignant neoplasm of bronchus and lung: Secondary | ICD-10-CM | POA: Insufficient documentation

## 2016-09-15 DIAGNOSIS — I10 Essential (primary) hypertension: Secondary | ICD-10-CM | POA: Diagnosis present

## 2016-09-15 LAB — CBC
HCT: 32.3 % — ABNORMAL LOW (ref 36.0–46.0)
Hemoglobin: 11.1 g/dL — ABNORMAL LOW (ref 12.0–15.0)
MCH: 29.2 pg (ref 26.0–34.0)
MCHC: 34.4 g/dL (ref 30.0–36.0)
MCV: 85 fL (ref 78.0–100.0)
PLATELETS: 225 10*3/uL (ref 150–400)
RBC: 3.8 MIL/uL — ABNORMAL LOW (ref 3.87–5.11)
RDW: 16.8 % — AB (ref 11.5–15.5)
WBC: 7.8 10*3/uL (ref 4.0–10.5)

## 2016-09-15 LAB — TYPE AND SCREEN
ABO/RH(D): A POS
ANTIBODY SCREEN: NEGATIVE

## 2016-09-15 LAB — LACTIC ACID, PLASMA: Lactic Acid, Venous: 0.9 mmol/L (ref 0.5–1.9)

## 2016-09-15 LAB — COMPREHENSIVE METABOLIC PANEL
ALBUMIN: 3.8 g/dL (ref 3.5–5.0)
ALK PHOS: 70 U/L (ref 38–126)
ALT: 12 U/L — AB (ref 14–54)
AST: 17 U/L (ref 15–41)
Anion gap: 7 (ref 5–15)
BUN: 13 mg/dL (ref 6–20)
CALCIUM: 8.8 mg/dL — AB (ref 8.9–10.3)
CO2: 25 mmol/L (ref 22–32)
CREATININE: 1.07 mg/dL — AB (ref 0.44–1.00)
Chloride: 108 mmol/L (ref 101–111)
GFR calc Af Amer: 58 mL/min — ABNORMAL LOW (ref >60)
GFR calc non Af Amer: 50 mL/min — ABNORMAL LOW (ref >60)
GLUCOSE: 106 mg/dL — AB (ref 65–99)
Potassium: 3.8 mmol/L (ref 3.5–5.1)
Sodium: 140 mmol/L (ref 135–145)
TOTAL PROTEIN: 6.9 g/dL (ref 6.5–8.1)
Total Bilirubin: 0.4 mg/dL (ref 0.3–1.2)

## 2016-09-15 LAB — POC OCCULT BLOOD, ED: Fecal Occult Bld: POSITIVE — AB

## 2016-09-15 LAB — HEMOGLOBIN AND HEMATOCRIT, BLOOD
HCT: 30.6 % — ABNORMAL LOW (ref 36.0–46.0)
HEMOGLOBIN: 10.5 g/dL — AB (ref 12.0–15.0)

## 2016-09-15 LAB — ABO/RH: ABO/RH(D): A POS

## 2016-09-15 MED ORDER — ALBUTEROL SULFATE (2.5 MG/3ML) 0.083% IN NEBU: 2.5000 mg | INHALATION_SOLUTION | RESPIRATORY_TRACT | Status: DC | PRN

## 2016-09-15 MED ORDER — ALBUTEROL SULFATE HFA 108 (90 BASE) MCG/ACT IN AERS: 2.0000 | INHALATION_SPRAY | RESPIRATORY_TRACT | Status: DC | PRN

## 2016-09-15 MED ORDER — ROSUVASTATIN CALCIUM 10 MG PO TABS
10.0000 mg | ORAL_TABLET | Freq: Every day | ORAL | Status: DC
  Administered 2016-09-15 – 2016-09-16 (×2): 10 mg via ORAL
  Filled 2016-09-15 (×2): qty 1

## 2016-09-15 MED ORDER — PANTOPRAZOLE SODIUM 40 MG PO TBEC
40.0000 mg | DELAYED_RELEASE_TABLET | Freq: Every day | ORAL | Status: DC
  Administered 2016-09-16 – 2016-09-17 (×2): 40 mg via ORAL
  Filled 2016-09-15 (×2): qty 1

## 2016-09-15 MED ORDER — NITROGLYCERIN 0.4 MG SL SUBL: 0.4000 mg | SUBLINGUAL_TABLET | SUBLINGUAL | Status: DC | PRN

## 2016-09-15 MED ORDER — PREDNISOLONE ACETATE 1 % OP SUSP
1.0000 [drp] | OPHTHALMIC | Status: DC
  Filled 2016-09-15: qty 1

## 2016-09-15 MED ORDER — AMLODIPINE-OLMESARTAN 5-40 MG PO TABS: 1.0000 | ORAL_TABLET | Freq: Every day | ORAL | Status: DC

## 2016-09-15 MED ORDER — SODIUM CHLORIDE 0.9 % IV SOLN
INTRAVENOUS | Status: DC
  Administered 2016-09-15: 18:00:00 via INTRAVENOUS
  Administered 2016-09-16: 1000 mL via INTRAVENOUS

## 2016-09-15 MED ORDER — CLONIDINE HCL 0.1 MG PO TABS
0.0500 mg | ORAL_TABLET | Freq: Two times a day (BID) | ORAL | Status: DC
  Administered 2016-09-15 – 2016-09-16 (×3): 0.05 mg via ORAL
  Administered 2016-09-17: 0.5 mg via ORAL
  Filled 2016-09-15 (×4): qty 1

## 2016-09-15 MED ORDER — IOPAMIDOL (ISOVUE-300) INJECTION 61%
30.0000 mL | Freq: Once | INTRAVENOUS | Status: AC
  Administered 2016-09-15: 30 mL via ORAL

## 2016-09-15 MED ORDER — SODIUM CHLORIDE 0.9 % IJ SOLN
INTRAMUSCULAR | Status: AC
  Filled 2016-09-15: qty 50

## 2016-09-15 MED ORDER — IOPAMIDOL (ISOVUE-300) INJECTION 61%
INTRAVENOUS | Status: AC
  Administered 2016-09-15: 100 mL via INTRAVENOUS
  Filled 2016-09-15: qty 100

## 2016-09-15 MED ORDER — ACETAMINOPHEN 500 MG PO TABS: 500.0000 mg | ORAL_TABLET | Freq: Four times a day (QID) | ORAL | Status: DC | PRN

## 2016-09-15 MED ORDER — AMLODIPINE BESYLATE 5 MG PO TABS
5.0000 mg | ORAL_TABLET | Freq: Every day | ORAL | Status: DC
  Administered 2016-09-16 – 2016-09-17 (×2): 5 mg via ORAL
  Filled 2016-09-15 (×2): qty 1

## 2016-09-15 MED ORDER — IRBESARTAN 150 MG PO TABS
300.0000 mg | ORAL_TABLET | Freq: Every day | ORAL | Status: DC
  Administered 2016-09-16 – 2016-09-17 (×2): 300 mg via ORAL
  Filled 2016-09-15 (×2): qty 2

## 2016-09-15 MED ORDER — AMOXICILLIN 250 MG/5ML PO SUSR
875.0000 mg | Freq: Two times a day (BID) | ORAL | Status: DC
  Administered 2016-09-15 – 2016-09-17 (×4): 875 mg via ORAL
  Filled 2016-09-15 (×4): qty 20

## 2016-09-15 MED ORDER — AMOXICILLIN 875 MG PO TABS: 875.0000 mg | ORAL_TABLET | Freq: Two times a day (BID) | ORAL | Status: DC

## 2016-09-15 NOTE — H&P (Addendum)
TRH H&P   Patient Demographics:    [name], is a 76 y.o. female  MRN: [id number]   DOB - March 08, 1941  Admit Date - 09/15/2016  Outpatient Primary MD for the patient is [name], MD  Referring MD/NP/PA: Dr. Casilda Carls  Outpatient Specialists: GI Dr Benson Norway  Patient coming from: Home  Chief Complaint  Patient presents with  . Blood in Stool      HPI:    [name]  is a 76 y.o. 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 Earlie Server. She presents with complaints of blood in stools 2 today, reports inhaler episode while at home, as well as another episode when she was at [facility], describe it as pure blood bowel movement, no stools seen, denies any melena, coffee-ground emesis, reports postprandial nausea, but denies any vomiting or diarrhea no abdominal pain,, reports constipation where she took laxative before 2 days, since then no further constipation, patient had recent endoscopy and colonoscopy by Dr. Benson Norway in October 2017 with no acute findings, she denies any NSAID use, she is not been on Plavix for CAD and PVD. - in ED workup significant for stable hemoglobin at 11.1, afebrile, no hypotension, I was called to admit    Review of systems:    In addition to the HPI above, No Fever-chills, No Headache, No changes with  Vision or hearing, No problems swallowing food or Liquids, No Chest pain, Cough or Shortness of Breath, No Abdominal pain, Course postprandial nausea, laxative 2 days ago for constipation, reports hematochezia 2 today No Blood in  Urine, No dysuria, No new skin rashes or bruises, No new joints pains-aches,  No new weakness, tingling, numbness in any extremity, No recent weight gain or loss, No polyuria, polydypsia or polyphagia, No significant Mental Stressors.  A full 10 point Review of Systems was done, except as stated above, all other Review of Systems were negative.   With Past History of the following :    Past Medical History:  Diagnosis Date  . 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 Arnold-Chiari malformation ([place]) 1998  . Asthma   . 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  . Carotid artery occlusion   . Complication of anesthesia   . COPD (chronic obstructive pulmonary disease) ([place])   . Coronary artery disease   . 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 Diverticulitis   . Gastroesophageal reflux disease   . Glaucoma   . Hiatal hernia   . Hyperlipidemia   .  Hypertension   . Iliac artery aneurysm ([place])   . 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 Myocardial infarction 01/01/2000   Cardiac catheterization  . Peripheral vascular disease ([place])   . PONV (postoperative nausea and vomiting)    occassionally  . Reflux       Past Surgical History:  Procedure Laterality Date  . ABDOMINAL HYSTERECTOMY    . APPENDECTOMY    . Arnold-chiari malformation repair  1998   Suboccipital craniectomy  . CAROTID ENDARTERECTOMY  03/29/2010   Left  CEA  . CHOLECYSTECTOMY     Gall Bladder  . COLONOSCOPY WITH PROPOFOL N/A 04/22/2015   Procedure: COLONOSCOPY WITH PROPOFOL;  Surgeon: Carol Ada, MD;  Location: [facility] ENDOSCOPY;  Service: Endoscopy;  Laterality: N/A;  . COLONOSCOPY WITH PROPOFOL N/A 05/25/2016   Procedure:  COLONOSCOPY WITH PROPOFOL;  Surgeon: Carol Ada, MD;  Location: [facility] ENDOSCOPY;  Service: Endoscopy;  Laterality: N/A;  . CORNEAL TRANSPLANT     Right  . CORONARY ARTERY BYPASS GRAFT  01/01/2000  . ESOPHAGOGASTRODUODENOSCOPY N/A 05/25/2016   Procedure: ESOPHAGOGASTRODUODENOSCOPY (EGD);  Surgeon: Carol Ada, MD;  Location: [facility];  Service: Endoscopy;  Laterality: N/A;  . EYE SURGERY     Laser surgery for retinal hemorrhage  . LEFT HEART CATHETERIZATION WITH CORONARY ANGIOGRAM N/A 08/03/2014   Procedure: LEFT HEART CATHETERIZATION WITH CORONARY ANGIOGRAM;  Surgeon: Birdie Riddle, MD;  Location: [facility];  Service: Cardiovascular;  Laterality: N/A;  . Post Coronary Artery  BPG  01/05/2000   Right jugular sheath removed  . PR VEIN BYPASS GRAFT,AORTO-FEM-POP    . ROTATOR CUFF REPAIR     Right      Social History:     Social History  Substance Use Topics  . Smoking status: Former Smoker    Packs/day: 1.50    Years: 30.00    Types: Cigarettes    Quit date: 08/13/2000  . Smokeless tobacco: Never Used  . Alcohol use No     Lives - At home  Mobility - with assistance     Family History :     Family History  Problem Relation Age of Onset  . Heart disease Mother     Heart Disease before age 12  . Hypertension Mother   . Hyperlipidemia Mother   . Heart attack Mother   . Clotting disorder Mother   . Heart disease Father     Heart Disease before age 108  . Heart attack Father   . Hyperlipidemia Father   . Hypertension Father   . Heart disease Brother     Heart Disease before age 67  . Hyperlipidemia Brother   . Hypertension Brother   . Clotting disorder Brother   . AAA (abdominal aortic aneurysm) Brother   . Cerebral aneurysm Sister   . Hypertension Sister   . AAA (abdominal aortic aneurysm) Sister   . Asthma Sister   . Cerebral aneurysm Brother   . Cancer Brother     Lung  . Hypertension Brother   . Heart attack Brother   . Heart disease Brother      Aneurysm of Brain  . Hypertension Brother   . Heart disease Brother   . Heart disease Brother   . Stroke Son     Aneurysm of Stomach  . AAA (abdominal aortic aneurysm) Son   . Cancer Maternal Uncle     great uncle/cancer/type unknown      Home Medications:   Prior  to Admission medications   Medication Sig Start Date End Date Taking? Authorizing Provider  acetaminophen (TYLENOL) 500 MG tablet Take 500 mg by mouth every 6 (six) hours as needed.   Yes Historical Provider, MD  albuterol (VENTOLIN HFA) 108 (90 BASE) MCG/ACT inhaler Inhale 2 puffs into the lungs every 4 (four) hours as needed for wheezing or shortness of breath (((PLAN B))). 08/09/13  Yes Pollie Friar, MD  amLODipine-olmesartan (AZOR) 5-40 MG tablet Take 1 tablet by mouth daily.   Yes Historical Provider, MD  amoxicillin (AMOXIL) 875 MG tablet Take 875 mg by mouth 2 (two) times daily. Started 1/29 x 10 days 09/10/16  Yes Historical Provider, MD  aspirin EC 81 MG tablet Take 81 mg by mouth daily.    Yes Historical Provider, MD  cloNIDine (CATAPRES) 0.1 MG tablet Take 0.05 mg by mouth 2 (two) times daily.    Yes Historical Provider, MD  clopidogrel (PLAVIX) 75 MG tablet Take 1 tablet (75 mg total) by mouth daily. 04/02/12  Yes Conrad Richburg, MD  dexlansoprazole (DEXILANT) 60 MG capsule Take 1 capsule (60 mg total) by mouth daily before breakfast. 11/17/12  Yes Tanda Rockers, MD  prednisoLONE acetate (PRED FORTE) 1 % ophthalmic suspension Place 1 drop into the right eye every other day.  02/26/14  Yes Historical Provider, MD  rosuvastatin (CRESTOR) 10 MG tablet Take 10 mg by mouth at bedtime.    Yes Historical Provider, MD  fluticasone-salmeterol (ADVAIR HFA) 115-21 MCG/ACT inhaler Inhale 2 puffs into the lungs 2 (two) times daily. Patient not taking: Reported on 08/08/2016 07/30/16   Noralee Space, MD  ipratropium-albuterol (DUONEB) 0.5-2.5 (3) MG/3ML SOLN Take 3 mLs by nebulization 4 (four) times daily. Patient not taking: Reported  on 08/08/2016 10/24/15   Janece Canterbury, MD  ketorolac (TORADOL) 10 MG tablet Take 1 tablet (10 mg total) by mouth every 6 (six) hours as needed. Patient not taking: Reported on 08/08/2016 07/14/16   Isla Pence, MD  nitroGLYCERIN (NITROSTAT) 0.4 MG SL tablet Place 0.4 mg under the tongue every 5 (five) minutes as needed for chest pain.     Historical Provider, MD     Allergies:     Allergies  Allergen Reactions  . Hydromorphone Palpitations and Other (See Comments)    DILAUDID  -  Pt had a Heart Attack after taking Dilaudid.  . Levaquin [Levofloxacin] Other (See Comments)    Chest pressure, SOB, "pain in between shoulder blades", sweaty -as reported by patient per experience in ED this afternoon  . Codeine Other (See Comments)    Dr. Terrence Dupont advised patient not to take this medication  . Doxycycline Swelling    Mouth, lips, feet swelling  . Oxycodone-Acetaminophen Other (See Comments)    Says it makes her feel weird  . Risedronate Other (See Comments)    Chest pain  . Avelox [Moxifloxacin Hcl In Nacl] Palpitations     Physical Exam:   Vitals  Blood pressure 147/56, pulse (!) 49, temperature 98 F (36.7 C), temperature source Oral, resp. rate 18, height '5\' 2"'$  (1.575 m), weight 74.8 kg (165 lb), SpO2 98 %.   1. General Frail elderly female lying in bed in NAD,    2. Normal affect and insight, Not Suicidal or Homicidal, Awake Alert, Oriented X 3.  3. No F.N deficits, ALL C.Nerves Intact, Strength 5/5 all 4 extremities, Sensation intact all 4 extremities, Plantars down going.  4. Ears and Eyes appear Normal, Conjunctivae clear, PERRLA. Moist Oral  Mucosa.  5. Supple Neck, No JVD, No cervical lymphadenopathy appriciated, No Carotid Bruits.  6. Symmetrical Chest wall movement, Good air movement bilaterally, CTAB.  7. RRR, No Gallops, Rubs or Murmurs, No Parasternal Heave.  8. Positive Bowel Sounds, Abdomen Soft, No tenderness, No organomegaly appriciated,No rebound  -guarding or rigidity.  9.  No Cyanosis, Normal Skin Turgor, No Skin Rash or Bruise.  10. Good muscle tone,  joints appear normal , no effusions, Normal ROM.      Data Review:    CBC  Recent Labs Lab 09/15/16 1249  WBC 7.8  HGB 11.1*  HCT 32.3*  PLT 225  MCV 85.0  MCH 29.2  MCHC 34.4  RDW 16.8*   ------------------------------------------------------------------------------------------------------------------  Chemistries   Recent Labs Lab 09/15/16 1249  NA 140  K 3.8  CL 108  CO2 25  GLUCOSE 106*  BUN 13  CREATININE 1.07*  CALCIUM 8.8*  AST 17  ALT 12*  ALKPHOS 70  BILITOT 0.4   ------------------------------------------------------------------------------------------------------------------ estimated creatinine clearance is 44.4 mL/min (by C-G formula based on SCr of 1.07 mg/dL (H)). ------------------------------------------------------------------------------------------------------------------ No results for input(s): TSH, T4TOTAL, T3FREE, THYROIDAB in the last 72 hours.  Invalid input(s): FREET3  Coagulation profile No results for input(s): INR, PROTIME in the last 168 hours. ------------------------------------------------------------------------------------------------------------------- No results for input(s): DDIMER in the last 72 hours. -------------------------------------------------------------------------------------------------------------------  Cardiac Enzymes No results for input(s): CKMB, TROPONINI, MYOGLOBIN in the last 168 hours.  Invalid input(s): CK ------------------------------------------------------------------------------------------------------------------    Component Value Date/Time   BNP 57.2 07/14/2016 1100     ---------------------------------------------------------------------------------------------------------------  Urinalysis    Component Value Date/Time   COLORURINE YELLOW 02/24/2016 1905    APPEARANCEUR CLEAR 02/24/2016 1905   LABSPEC 1.019 02/24/2016 1905   PHURINE 5.5 02/24/2016 1905   GLUCOSEU NEGATIVE 02/24/2016 1905   HGBUR NEGATIVE 02/24/2016 1905   BILIRUBINUR NEGATIVE 02/24/2016 1905   KETONESUR NEGATIVE 02/24/2016 1905   PROTEINUR NEGATIVE 02/24/2016 1905   UROBILINOGEN 0.2 04/02/2013 0232   NITRITE NEGATIVE 02/24/2016 1905   LEUKOCYTESUR NEGATIVE 02/24/2016 1905    ----------------------------------------------------------------------------------------------------------------   Imaging Results:    No results found.    Assessment & Plan:    Active Problems:   COPD GOLD II   Peripheral vascular disease (HCC)   CAD (coronary artery disease)   Essential hypertension   GERD (gastroesophageal reflux disease)   Primary cancer of right lower lobe of lung (HCC)   Bronchitis, acute   BRBPR (bright red blood per rectum)  Hematochezia - She reports 2 episodes significant amount of pure blood today, and workup by Dr. Benson Norway including no acute finding in EGD and colonoscopy October 2017, hemoglobin remained stable, she is known to have history of peripheral vascular disease, reports some nausea after eating, ischemic colitis is a possibility, will check lactic acid, will obtain CT abdomen pelvis with IV contrast, and keep on clear liquid diet, continue with IV fluids, and will monitor H&H closely. - Continue to hold aspirin and Plavix   History of CAD status post CABG - She denies any chest pain or shortness of breath, continue to hold aspirin and Plavix, continue with statin  Peripheral vascular disease - Discussed post stenting by Dr. Doren Custard in the past, continue to hold aspirin and Plavix given GI bleed  Hypertension - Continue with home medication  Hyperlipidemia - Continue with statin  COPD - No active wheezing, continue with when necessary albuterol, patient started on amoxicillin by her PCP for acute bronchitis treatment, will continue the  course  Lung cancer - status post radiation, she is following with Dr. Inda Merlin, appointment scheduled for end of this month  DVT Prophylaxis  SCDs  AM Labs Ordered, also please review Full Orders  Family Communication: Admission, patients condition and plan of care including tests being ordered have been discussed with the patient and Daughter who indicate understanding and agree with the plan and Code Status.  Code Status DNR, Confirmed by daughter and patient  Likely DC to Home  Condition GUARDED    Consults called: GI Dr Fuller Plan  Admission status: observation  Time spent in minutes : 65 minutes   Audric Venn M.D on 09/15/2016 at 3:44 PM  Between 7am to 7pm - Pager - [phone]. After 7pm go to [email or web address] - password [facility]  [facility] - Office  [phone]

## 2016-09-15 NOTE — ED Provider Notes (Signed)
[facility] DEPT Provider Note   CSN: [id number] Arrival date & time: 09/15/16  1151     History   Chief Complaint Chief Complaint  Patient presents with  . Blood in Stool    HPI [name] is a 76 y.o. female.  HPI Pt with hx of asthma, copd, lung CA s/p radiation, CAD, HTN, HL, ? Diverticular dz and hx of lower GI bleed comes in with cc of bloody stools. PT had 2 episodes of bloody stools today - described as "there was so much blood, it's like I miscarried." She also had small clots. She is sure that the stools were bloody and not her urine. Pt  Is on plavix. Pt has no abd pain. No n/v/dib/chest pain.  Past Medical History:  Diagnosis Date  . 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 Asthma   . 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  . Carotid artery occlusion   . Complication of anesthesia   . COPD (chronic obstructive pulmonary disease) ([place])   . Coronary artery disease   . 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 Diverticulitis   . Gastroesophageal reflux disease   . Glaucoma   . Hiatal hernia   . Hyperlipidemia   . Hypertension   . Iliac artery aneurysm ([place])   . 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 Myocardial infarction 01/01/2000   Cardiac catheterization  . Peripheral vascular disease ([place])   . PONV (postoperative nausea and vomiting)    occassionally  . Reflux     Patient Active Problem List   Diagnosis Date Noted  . Iron deficiency anemia 06/07/2016  . Bronchitis, acute 06/07/2016  . 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 Primary cancer of right lower lobe of lung ([place]) 04/25/2016  . 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 Foot pain, right 10/24/2015  . CAP (community acquired pneumonia) 10/22/2015  . Prolonged QT interval 10/22/2015  . UTI (urinary tract infection) 10/22/2015  . COPD exacerbation ([place]) 10/22/2015  . Neck pain on right side 01/05/2015  .  Chest pain 08/02/2014  . Intractable headache 08/02/2014  . HLD (hyperlipidemia) 08/02/2014  . Essential hypertension 08/02/2014  . GERD (gastroesophageal reflux disease) 08/02/2014  . Leg pain, right 08/02/2014  . HA (headache)   . Carotid artery stenosis 12/09/2013  . 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  . Dehydration 04/02/2013  . CAD (coronary artery disease) 04/02/2013  . AKI (acute kidney injury) ([place]) 04/01/2013  . Nausea & vomiting 04/01/2013  . Diarrhea 04/01/2013  . Hypokalemia 04/01/2013  . Hyponatremia 04/01/2013  . Peripheral vascular disease ([place]) 01/07/2013  . Occlusion and stenosis of carotid artery without mention of cerebral infarction 01/07/2013  . Dizziness and giddiness 01/07/2013  . Shortness of breath 01/07/2013  . Cough 11/17/2012  . COPD GOLD II 11/17/2012    Past Surgical History:  Procedure Laterality Date  . ABDOMINAL HYSTERECTOMY    . APPENDECTOMY    . Arnold-chiari malformation repair  1998   Suboccipital craniectomy  . CAROTID ENDARTERECTOMY  03/29/2010   Left  CEA  . CHOLECYSTECTOMY     Gall Bladder  . COLONOSCOPY WITH PROPOFOL N/A 04/22/2015   Procedure: COLONOSCOPY WITH PROPOFOL;  Surgeon: Carol Ada, MD;  Location: [facility];  Service: Endoscopy;  Laterality: N/A;  . COLONOSCOPY WITH PROPOFOL N/A 05/25/2016   Procedure: COLONOSCOPY WITH PROPOFOL;  Surgeon: Carol Ada, MD;  Location: [facility];  Service:  Endoscopy;  Laterality: N/A;  . CORNEAL TRANSPLANT     Right  . CORONARY ARTERY BYPASS GRAFT  01/01/2000  . ESOPHAGOGASTRODUODENOSCOPY N/A 05/25/2016   Procedure: ESOPHAGOGASTRODUODENOSCOPY (EGD);  Surgeon: Carol Ada, MD;  Location: [facility];  Service: Endoscopy;  Laterality: N/A;  . EYE SURGERY     Laser surgery for retinal hemorrhage  . LEFT HEART CATHETERIZATION WITH CORONARY ANGIOGRAM N/A 08/03/2014   Procedure: LEFT HEART CATHETERIZATION WITH CORONARY ANGIOGRAM;  Surgeon: Birdie Riddle, MD;  Location: [facility];   Service: Cardiovascular;  Laterality: N/A;  . Post Coronary Artery  BPG  01/05/2000   Right jugular sheath removed  . PR VEIN BYPASS GRAFT,AORTO-FEM-POP    . ROTATOR CUFF REPAIR     Right    OB History    No data available       Home Medications    Prior to Admission medications   Medication Sig Start Date End Date Taking? Authorizing Provider  acetaminophen (TYLENOL) 500 MG tablet Take 500 mg by mouth every 6 (six) hours as needed.   Yes Historical Provider, MD  albuterol (VENTOLIN HFA) 108 (90 BASE) MCG/ACT inhaler Inhale 2 puffs into the lungs every 4 (four) hours as needed for wheezing or shortness of breath (((PLAN B))). 08/09/13  Yes Pollie Friar, MD  amLODipine-olmesartan (AZOR) 5-40 MG tablet Take 1 tablet by mouth daily.   Yes Historical Provider, MD  amoxicillin (AMOXIL) 875 MG tablet Take 875 mg by mouth 2 (two) times daily. Started 1/29 x 10 days 09/10/16  Yes Historical Provider, MD  aspirin EC 81 MG tablet Take 81 mg by mouth daily.    Yes Historical Provider, MD  cloNIDine (CATAPRES) 0.1 MG tablet Take 0.05 mg by mouth 2 (two) times daily.    Yes Historical Provider, MD  clopidogrel (PLAVIX) 75 MG tablet Take 1 tablet (75 mg total) by mouth daily. 04/02/12  Yes Conrad [place], MD  dexlansoprazole (DEXILANT) 60 MG capsule Take 1 capsule (60 mg total) by mouth daily before breakfast. 11/17/12  Yes Tanda Rockers, MD  prednisoLONE acetate (PRED FORTE) 1 % ophthalmic suspension Place 1 drop into the right eye every other day.  02/26/14  Yes Historical Provider, MD  rosuvastatin (CRESTOR) 10 MG tablet Take 10 mg by mouth at bedtime.    Yes Historical Provider, MD  fluticasone-salmeterol (ADVAIR HFA) 115-21 MCG/ACT inhaler Inhale 2 puffs into the lungs 2 (two) times daily. Patient not taking: Reported on 08/08/2016 07/30/16   Noralee Space, MD  ipratropium-albuterol (DUONEB) 0.5-2.5 (3) MG/3ML SOLN Take 3 mLs by nebulization 4 (four) times daily. Patient not taking: Reported on  08/08/2016 10/24/15   Janece Canterbury, MD  ketorolac (TORADOL) 10 MG tablet Take 1 tablet (10 mg total) by mouth every 6 (six) hours as needed. Patient not taking: Reported on 08/08/2016 07/14/16   Isla Pence, MD  nitroGLYCERIN (NITROSTAT) 0.4 MG SL tablet Place 0.4 mg under the tongue every 5 (five) minutes as needed for chest pain.     Historical Provider, MD    Family History Family History  Problem Relation Age of Onset  . Heart disease Mother     Heart Disease before age 85  . Hypertension Mother   . Hyperlipidemia Mother   . Heart attack Mother   . Clotting disorder Mother   . Heart disease Father     Heart Disease before age 59  . Heart attack Father   . Hyperlipidemia Father   . Hypertension Father   .  Heart disease Brother     Heart Disease before age 49  . Hyperlipidemia Brother   . Hypertension Brother   . Clotting disorder Brother   . AAA (abdominal aortic aneurysm) Brother   . Cerebral aneurysm Sister   . Hypertension Sister   . AAA (abdominal aortic aneurysm) Sister   . Asthma Sister   . Cerebral aneurysm Brother   . Cancer Brother     Lung  . Hypertension Brother   . Heart attack Brother   . Heart disease Brother     Aneurysm of Brain  . Hypertension Brother   . Heart disease Brother   . Heart disease Brother   . Stroke Son     Aneurysm of Stomach  . AAA (abdominal aortic aneurysm) Son   . Cancer Maternal Uncle     great uncle/cancer/type unknown    Social History Social History  Substance Use Topics  . Smoking status: Former Smoker    Packs/day: 1.50    Years: 30.00    Types: Cigarettes    Quit date: 08/13/2000  . Smokeless tobacco: Never Used  . Alcohol use No     Allergies   Hydromorphone; Levaquin [levofloxacin]; Codeine; Doxycycline; Oxycodone-acetaminophen; Risedronate; and Avelox [moxifloxacin hcl in nacl]   Review of Systems Review of Systems  ROS 10 Systems reviewed and are negative for acute change except as noted in the  HPI.     Physical Exam Updated Vital Signs BP (!) 125/52 (BP Location: Left Arm)   Pulse (!) 57   Temp 98 F (36.7 C) (Oral)   Resp 18   Ht '5\' 2"'$  (1.575 m)   Wt 165 lb (74.8 kg)   SpO2 95%   BMI 30.18 kg/m   Physical Exam  Constitutional: She is oriented to person, place, and time. She appears well-developed and well-nourished.  HENT:  Head: Normocephalic and atraumatic.  Eyes: EOM are normal. Pupils are equal, round, and reactive to light.  Neck: Neck supple.  Cardiovascular: Normal rate, regular rhythm and normal heart sounds.   No murmur heard. Pulmonary/Chest: Effort normal. No respiratory distress.  Abdominal: Soft. She exhibits no distension. There is no tenderness. There is no rebound and no guarding.  External hemorrhoids seen - but there is no clot/thrombosis. DRE reveals BRBPR  Neurological: She is alert and oriented to person, place, and time.  Skin: Skin is warm and dry.     ED Treatments / Results  Labs (all labs ordered are listed, but only abnormal results are displayed) Labs Reviewed  COMPREHENSIVE METABOLIC PANEL - Abnormal; Notable for the following:       Result Value   Glucose, Bld 106 (*)    Creatinine, Ser 1.07 (*)    Calcium 8.8 (*)    ALT 12 (*)    GFR calc non Af Amer 50 (*)    GFR calc Af Amer 58 (*)    All other components within normal limits  CBC - Abnormal; Notable for the following:    RBC 3.80 (*)    Hemoglobin 11.1 (*)    HCT 32.3 (*)    RDW 16.8 (*)    All other components within normal limits  POC OCCULT BLOOD, ED - Abnormal; Notable for the following:    Fecal Occult Bld POSITIVE (*)    All other components within normal limits  TYPE AND SCREEN    EKG  EKG Interpretation None       Radiology No results found.  Procedures Procedures (including  critical care time)  Medications Ordered in ED Medications - No data to display   Initial Impression / Assessment and Plan / ED Course  I have reviewed the triage  vital signs and the nursing notes.  Pertinent labs & imaging results that were available during my care of the patient were reviewed by me and considered in my medical decision making (see chart for details).     DDx includes: Esophagitis PUD/Gastritis/ulcers Diverticular bleed Colon cancer Rectal bleed Internal hemorrhoids External hemorrhoids  Pt with lower GI bleed. In the past has had her Hb drop into the 8s. She had upper and lower scope done in October, and she had no source of bleeding uncovered at that time. Ct from the past show no diverticular disease recorded. Unsure what the cause is. She denies GU loss. We will admit for serial CBC.    Final Clinical Impressions(s) / ED Diagnoses   Final diagnoses:  Acute lower GI bleeding    New Prescriptions New Prescriptions   No medications on file     Varney Biles, MD 09/15/16 1446

## 2016-09-15 NOTE — Consult Note (Signed)
Consult Note   Referring Provider: Phillips Climes, MD  Primary Care Physician:  [name], MD Primary Gastroenterologist:  Carol Ada, MD  Reason for Consultation:  hematochezia  HPI: [name] is a 76 y.o. female who developed the sudden onset of bright red rectal bleeding x 2 today and presented to [facility] ED. First BM had some stood red blood. Second was all red blood. She reports mild lower abdominal pain which is new today. She has had post prandial nausea for months. She underwent colonoscopy and EGD in 05/2016 for iron deficiency anemia and both were normal. A capsule endoscopy was recommended but was not performed. She had been on Augmentin for 4 days for bronchitis.    Past Medical History:  Diagnosis Date  . 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 Asthma   . 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  . Carotid artery occlusion   . Complication of anesthesia   . COPD (chronic obstructive pulmonary disease) ([place])   . Coronary artery disease   . 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 Diverticulitis   . Gastroesophageal reflux disease   . Glaucoma   . Hiatal hernia   . Hyperlipidemia   . Hypertension   . Iliac artery aneurysm ([place])   . 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 Myocardial infarction 01/01/2000   Cardiac catheterization  . Peripheral vascular disease ([place])   . PONV (postoperative nausea and vomiting)    occassionally  . Reflux     Past Surgical History:  Procedure Laterality Date  . ABDOMINAL HYSTERECTOMY    . APPENDECTOMY    . Arnold-chiari malformation repair  1998   Suboccipital craniectomy  . CAROTID ENDARTERECTOMY  03/29/2010   Left  CEA  . CHOLECYSTECTOMY     Gall Bladder  . COLONOSCOPY WITH PROPOFOL N/A 04/22/2015   Procedure: COLONOSCOPY WITH PROPOFOL;  Surgeon: Carol Ada, MD;  [facility];  Service: Endoscopy;   Laterality: N/A;  . COLONOSCOPY WITH PROPOFOL N/A 05/25/2016   Procedure: COLONOSCOPY WITH PROPOFOL;  Surgeon: Carol Ada, MD;  [facility];  Service: Endoscopy;  Laterality: N/A;  . CORNEAL TRANSPLANT     Right  . CORONARY ARTERY BYPASS GRAFT  01/01/2000  . ESOPHAGOGASTRODUODENOSCOPY N/A 05/25/2016   Procedure: ESOPHAGOGASTRODUODENOSCOPY (EGD);  Surgeon: Carol Ada, MD;  Location: [facility];  Service: Endoscopy;  Laterality: N/A;  . EYE SURGERY     Laser surgery for retinal hemorrhage  . LEFT HEART CATHETERIZATION WITH CORONARY ANGIOGRAM N/A 08/03/2014   Procedure: LEFT HEART CATHETERIZATION WITH CORONARY ANGIOGRAM;  Surgeon: Birdie Riddle, MD;  Location: [facility];  Service: Cardiovascular;  Laterality: N/A;  . Post Coronary Artery  BPG  01/05/2000   Right jugular sheath removed  . PR VEIN BYPASS GRAFT,AORTO-FEM-POP    . ROTATOR CUFF REPAIR     Right    Prior to Admission medications   Medication Sig Start Date End Date Taking? Authorizing Provider  acetaminophen (TYLENOL) 500 MG tablet Take 500 mg by mouth every 6 (six) hours as needed.   Yes Historical Provider, MD  albuterol (VENTOLIN HFA) 108 (90 BASE) MCG/ACT inhaler Inhale 2 puffs into the lungs every 4 (four) hours as needed for wheezing or shortness of breath (((PLAN B))). 08/09/13  Yes Pollie Friar, MD  amLODipine-olmesartan (AZOR) 5-40 MG tablet Take 1 tablet by mouth daily.   Yes Historical  Provider, MD  amoxicillin (AMOXIL) 875 MG tablet Take 875 mg by mouth 2 (two) times daily. Started 1/29 x 10 days 09/10/16  Yes Historical Provider, MD  aspirin EC 81 MG tablet Take 81 mg by mouth daily.    Yes Historical Provider, MD  cloNIDine (CATAPRES) 0.1 MG tablet Take 0.05 mg by mouth 2 (two) times daily.    Yes Historical Provider, MD  clopidogrel (PLAVIX) 75 MG tablet Take 1 tablet (75 mg total) by mouth daily. 04/02/12  Yes Conrad [place], MD  dexlansoprazole (DEXILANT) 60 MG capsule Take 1 capsule (60 mg total)  by mouth daily before breakfast. 11/17/12  Yes Tanda Rockers, MD  prednisoLONE acetate (PRED FORTE) 1 % ophthalmic suspension Place 1 drop into the right eye every other day.  02/26/14  Yes Historical Provider, MD  rosuvastatin (CRESTOR) 10 MG tablet Take 10 mg by mouth at bedtime.    Yes Historical Provider, MD  fluticasone-salmeterol (ADVAIR HFA) 115-21 MCG/ACT inhaler Inhale 2 puffs into the lungs 2 (two) times daily. Patient not taking: Reported on 08/08/2016 07/30/16   Noralee Space, MD  ipratropium-albuterol (DUONEB) 0.5-2.5 (3) MG/3ML SOLN Take 3 mLs by nebulization 4 (four) times daily. Patient not taking: Reported on 08/08/2016 10/24/15   Janece Canterbury, MD  ketorolac (TORADOL) 10 MG tablet Take 1 tablet (10 mg total) by mouth every 6 (six) hours as needed. Patient not taking: Reported on 08/08/2016 07/14/16   Isla Pence, MD  nitroGLYCERIN (NITROSTAT) 0.4 MG SL tablet Place 0.4 mg under the tongue every 5 (five) minutes as needed for chest pain.     Historical Provider, MD    Current Facility-Administered Medications  Medication Dose Route Frequency Provider Last Rate Last Dose  . iopamidol (ISOVUE-300) 61 % injection 30 mL  30 mL Oral Once Albertine Patricia, MD       Current Outpatient Prescriptions  Medication Sig Dispense Refill  . acetaminophen (TYLENOL) 500 MG tablet Take 500 mg by mouth every 6 (six) hours as needed.    Marland Kitchen albuterol (VENTOLIN HFA) 108 (90 BASE) MCG/ACT inhaler Inhale 2 puffs into the lungs every 4 (four) hours as needed for wheezing or shortness of breath (((PLAN B))). 1 Inhaler 11  . amLODipine-olmesartan (AZOR) 5-40 MG tablet Take 1 tablet by mouth daily.    Marland Kitchen amoxicillin (AMOXIL) 875 MG tablet Take 875 mg by mouth 2 (two) times daily. Started 1/29 x 10 days  0  . aspirin EC 81 MG tablet Take 81 mg by mouth daily.     . cloNIDine (CATAPRES) 0.1 MG tablet Take 0.05 mg by mouth 2 (two) times daily.     . clopidogrel (PLAVIX) 75 MG tablet Take 1 tablet (75 mg  total) by mouth daily. 90 tablet 3  . dexlansoprazole (DEXILANT) 60 MG capsule Take 1 capsule (60 mg total) by mouth daily before breakfast. 30 capsule 11  . prednisoLONE acetate (PRED FORTE) 1 % ophthalmic suspension Place 1 drop into the right eye every other day.     . rosuvastatin (CRESTOR) 10 MG tablet Take 10 mg by mouth at bedtime.     . fluticasone-salmeterol (ADVAIR HFA) 115-21 MCG/ACT inhaler Inhale 2 puffs into the lungs 2 (two) times daily. (Patient not taking: Reported on 08/08/2016) 1 Inhaler 12  . ipratropium-albuterol (DUONEB) 0.5-2.5 (3) MG/3ML SOLN Take 3 mLs by nebulization 4 (four) times daily. (Patient not taking: Reported on 08/08/2016) 360 mL 0  . ketorolac (TORADOL) 10 MG tablet Take 1 tablet (10  mg total) by mouth every 6 (six) hours as needed. (Patient not taking: Reported on 08/08/2016) 10 tablet 0  . nitroGLYCERIN (NITROSTAT) 0.4 MG SL tablet Place 0.4 mg under the tongue every 5 (five) minutes as needed for chest pain.       Allergies as of 09/15/2016 - Review Complete 09/15/2016  Allergen Reaction Noted  . Hydromorphone Palpitations and Other (See Comments) 01/05/2015  . Levaquin [levofloxacin] Other (See Comments) 10/22/2015  . Codeine Other (See Comments) 11/27/2011  . Doxycycline Swelling   . Oxycodone-acetaminophen Other (See Comments) 11/27/2011  . Risedronate Other (See Comments) 08/02/2014  . Avelox [moxifloxacin hcl in nacl] Palpitations 11/27/2011    Family History  Problem Relation Age of Onset  . Heart disease Mother     Heart Disease before age 49  . Hypertension Mother   . Hyperlipidemia Mother   . Heart attack Mother   . Clotting disorder Mother   . Heart disease Father     Heart Disease before age 29  . Heart attack Father   . Hyperlipidemia Father   . Hypertension Father   . Heart disease Brother     Heart Disease before age 90  . Hyperlipidemia Brother   . Hypertension Brother   . Clotting disorder Brother   . AAA (abdominal aortic  aneurysm) Brother   . Cerebral aneurysm Sister   . Hypertension Sister   . AAA (abdominal aortic aneurysm) Sister   . Asthma Sister   . Cerebral aneurysm Brother   . Cancer Brother     Lung  . Hypertension Brother   . Heart attack Brother   . Heart disease Brother     Aneurysm of Brain  . Hypertension Brother   . Heart disease Brother   . Heart disease Brother   . Stroke Son     Aneurysm of Stomach  . AAA (abdominal aortic aneurysm) Son   . Cancer Maternal Uncle     great uncle/cancer/type unknown    Social History   Social History  . Marital status: Widowed    Spouse name: N/A  . Number of children: 4  . Years of education: 7TH   Occupational History  . Not on file.   Social History Main Topics  . Smoking status: Former Smoker    Packs/day: 1.50    Years: 30.00    Types: Cigarettes    Quit date: 08/13/2000  . Smokeless tobacco: Never Used  . Alcohol use No  . Drug use: No  . Sexual activity: No   Other Topics Concern  . Not on file   Social History Narrative   Lives at home w/ her son   Right-handed   Drinks coffee and [employer] daily       Review of Systems: Gen: Denies any fever, chills, sweats, anorexia, fatigue, weakness, malaise, weight loss, and sleep disorder CV: Denies chest pain, angina, palpitations, syncope, orthopnea, PND, peripheral edema, and claudication. Resp: Denies dyspnea at rest, dyspnea with exercise, cough, sputum, wheezing, coughing up blood, and pleurisy. GI: Denies vomiting blood, jaundice, and fecal incontinence.   Denies dysphagia or odynophagia. GU : Denies urinary burning, blood in urine, urinary frequency, urinary hesitancy, nocturnal urination, and urinary incontinence. MS: Denies joint pain, limitation of movement, and swelling, stiffness, low back pain, extremity pain. Denies muscle weakness, cramps, atrophy.  Derm: Denies rash, itching, dry skin, hives, moles, warts, or unhealing ulcers.  Psych: Denies depression, anxiety,  memory loss, suicidal ideation, hallucinations, paranoia, and confusion. Heme: Denies bruising,  bleeding, and enlarged lymph nodes. Neuro:  Denies any headaches, dizziness, paresthesias. Endo:  Denies any problems with DM, thyroid, adrenal function.  Physical Exam: Vital signs in last 24 hours: Temp:  [97.9 F (36.6 C)-98 F (36.7 C)] 98 F (36.7 C) (02/03 1234) Pulse Rate:  [49-62] 62 (02/03 1627) Resp:  [18] 18 (02/03 1627) BP: (125-161)/(49-86) 140/56 (02/03 1627) SpO2:  [95 %-100 %] 99 % (02/03 1627) Weight:  [165 lb (74.8 kg)] 165 lb (74.8 kg) (02/03 1156)    General:  Alert, well-developed, well-nourished, elderly female in NAD Head:  Normocephalic and atraumatic. Eyes:  Sclera clear, no icterus. Conjunctiva pink. Ears:  Normal auditory acuity. Nose:  No deformity, discharge, or lesions. Mouth:  No deformity or lesions. Oropharynx pink & moist. Neck:  Supple; no masses or thyromegaly. Chest:  Clear throughout to auscultation. No wheezes, crackles, or rhonchi. No acute distress. Heart:  Regular rate and rhythm; no murmurs, clicks, rubs, or gallops. Abdomen:  Soft, mild RLQ tenderness and nondistended. No masses, hepatosplenomegaly or hernias noted. Normal bowel sounds, without guarding, and without rebound.   Rectal:  External hemorrhoids. DRE shows BRBPR and no lesions per EDP, not repeated    Msk:  Symmetrical without gross deformities. Normal posture. Pulses:  Normal pulses noted. Extremities:  Without clubbing or edema. Neurologic:  Alert and  oriented x4;  grossly normal neurologically. Skin:  Intact without significant lesions or rashes. Cervical Nodes:  No significant cervical adenopathy. Psych:  Alert and cooperative. Normal mood and affect.  Intake/Output from previous day: No intake/output data recorded. Intake/Output this shift: No intake/output data recorded.  Lab Results:  Recent Labs  09/15/16 1249  WBC 7.8  HGB 11.1*  HCT 32.3*  PLT 225    BMET  Recent Labs  09/15/16 1249  NA 140  K 3.8  CL 108  CO2 25  GLUCOSE 106*  BUN 13  CREATININE 1.07*  CALCIUM 8.8*   LFT  Recent Labs  09/15/16 1249  PROT 6.9  ALBUMIN 3.8  AST 17  ALT 12*  ALKPHOS 70  BILITOT 0.4    Previous Endoscopies: See HPI  Impression/ Recommendations:  1. Acute hematochezia with lower abdominal pain in a pt with PVD. R/O ischemic colitis, other colitis. Abd/pelvic CT today. Trend CBC. Vigorous IV hydration. Clear liquid diet. Hold ASA/Plavix for now. If CT negative for changes c/w ischemic colitis, and no other diagnosis established to explain symptoms, please send stool for C diff.  2. CAD, S/P CABG  3. PVD  4. History of lung cancer post radiation therapy  5. COPD with recent bronchitis on Augmentin.    LOS: 0 days   Malcolm T. Fuller Plan MD 09/15/2016, 4:35 PM [phone] Mon-Fri 8a-5p  [phone] after 5p, weekends, holidays

## 2016-09-15 NOTE — ED Notes (Signed)
I have just given report to Legrand Rams, Therapist, sports and will transport now. Pt. Remains comfortable and in no distress.

## 2016-09-15 NOTE — ED Triage Notes (Signed)
Pt complaint of bright red blood in stool onset this morning; hx of same with associated low hemoglobin.

## 2016-09-16 DIAGNOSIS — R1031 Right lower quadrant pain: Secondary | ICD-10-CM | POA: Diagnosis not present

## 2016-09-16 DIAGNOSIS — J449 Chronic obstructive pulmonary disease, unspecified: Secondary | ICD-10-CM | POA: Diagnosis not present

## 2016-09-16 DIAGNOSIS — K921 Melena: Secondary | ICD-10-CM | POA: Diagnosis not present

## 2016-09-16 DIAGNOSIS — K625 Hemorrhage of anus and rectum: Secondary | ICD-10-CM | POA: Diagnosis not present

## 2016-09-16 DIAGNOSIS — J209 Acute bronchitis, unspecified: Secondary | ICD-10-CM | POA: Diagnosis not present

## 2016-09-16 DIAGNOSIS — I2510 Atherosclerotic heart disease of native coronary artery without angina pectoris: Secondary | ICD-10-CM | POA: Diagnosis not present

## 2016-09-16 LAB — CBC
HCT: 31.3 % — ABNORMAL LOW (ref 36.0–46.0)
HEMOGLOBIN: 10.9 g/dL — AB (ref 12.0–15.0)
MCH: 29.8 pg (ref 26.0–34.0)
MCHC: 34.8 g/dL (ref 30.0–36.0)
MCV: 85.5 fL (ref 78.0–100.0)
Platelets: 205 10*3/uL (ref 150–400)
RBC: 3.66 MIL/uL — AB (ref 3.87–5.11)
RDW: 16.9 % — ABNORMAL HIGH (ref 11.5–15.5)
WBC: 5.9 10*3/uL (ref 4.0–10.5)

## 2016-09-16 LAB — BASIC METABOLIC PANEL
Anion gap: 7 (ref 5–15)
BUN: 9 mg/dL (ref 6–20)
CHLORIDE: 108 mmol/L (ref 101–111)
CO2: 24 mmol/L (ref 22–32)
CREATININE: 0.95 mg/dL (ref 0.44–1.00)
Calcium: 8.6 mg/dL — ABNORMAL LOW (ref 8.9–10.3)
GFR calc non Af Amer: 58 mL/min — ABNORMAL LOW (ref >60)
GLUCOSE: 101 mg/dL — AB (ref 65–99)
Potassium: 3.8 mmol/L (ref 3.5–5.1)
Sodium: 139 mmol/L (ref 135–145)

## 2016-09-16 MED ORDER — DICYCLOMINE HCL 10 MG PO CAPS
10.0000 mg | ORAL_CAPSULE | Freq: Three times a day (TID) | ORAL | Status: DC
  Administered 2016-09-16 – 2016-09-17 (×4): 10 mg via ORAL
  Filled 2016-09-16 (×4): qty 1

## 2016-09-16 NOTE — Care Management Note (Signed)
Case Management Note  Patient Details  Name: [name] MRN: [id number] Date of Birth: 03-23-1941  Subjective/Objective:   COPD                 Action/Plan: Discharge Planning: NCM spoke to pt and lives at home with son. States her son or dtrs take her to appts. She has RW, tub bench, and neb machine. No problems with getting meds.   PCP Coletta Memos, DAVID    Expected Discharge Date:                 Expected Discharge Plan:  Home/Self Care  In-House Referral:  NA  Discharge planning Services  CM Consult  Post Acute Care Choice:  NA Choice offered to:  NA  DME Arranged:  N/A DME Agency:  NA  HH Arranged:  NA HH Agency:  NA  Status of Service:  Completed, signed off  If discussed at [place] of Stay Meetings, dates discussed:    Additional Comments:  Erenest Rasher, RN 09/16/2016, 6:01 PM

## 2016-09-16 NOTE — Progress Notes (Signed)
GI Progress Note-covering for Dr. Benson Norway   Subjective  Has had small very amounts blood per rectum since last yesterday. RLQ abd pain has improved.    Objective  Vital signs in last 24 hours: Temp:  [98.3 F (36.8 C)-98.7 F (37.1 C)] 98.7 F (37.1 C) (02/04 0511) Pulse Rate:  [49-67] 64 (02/04 0511) Resp:  [16-18] 18 (02/04 0511) BP: (125-183)/(50-76) 183/62 (02/04 0949) SpO2:  [95 %-99 %] 98 % (02/04 0511) Weight:  [165 lb (74.8 kg)] 165 lb (74.8 kg) (02/03 1700) Last BM Date: 09/16/16  General: Alert, well-developed, in NAD Heart:  Regular rate and rhythm; no murmurs Chest: Clear to ascultation bilaterally Abdomen:  Soft, mild RLQ tenderness and nondistended. Normal bowel sounds, without guarding, and without rebound.   Extremities:  Without edema. Neurologic:  Alert and  oriented x4; grossly normal neurologically. Psych:  Alert and cooperative. Normal mood and affect.  Intake/Output from previous day: 02/03 0701 - 02/04 0700 In: 1080 [P.O.:1080] Out: -  Intake/Output this shift: Total I/O In: 1435 [I.V.:1435] Out: -   Lab Results:  Recent Labs  09/15/16 1249 09/15/16 2048 09/16/16 0414  WBC 7.8  --  5.9  HGB 11.1* 10.5* 10.9*  HCT 32.3* 30.6* 31.3*  PLT 225  --  205   BMET  Recent Labs  09/15/16 1249 09/16/16 0414  NA 140 139  K 3.8 3.8  CL 108 108  CO2 25 24  GLUCOSE 106* 101*  BUN 13 9  CREATININE 1.07* 0.95  CALCIUM 8.8* 8.6*   LFT  Recent Labs  09/15/16 1249  PROT 6.9  ALBUMIN 3.8  AST 17  ALT 12*  ALKPHOS 70  BILITOT 0.4    Studies/Results: Ct Abdomen Pelvis W Contrast  Result Date: 09/15/2016 CLINICAL DATA:  Hematochezia. EXAM: CT ABDOMEN AND PELVIS WITH CONTRAST TECHNIQUE: Multidetector CT imaging of the abdomen and pelvis was performed using the standard protocol following bolus administration of intravenous contrast. CONTRAST:  100 cc Isovue-300 IV COMPARISON:  PET-CT 03/14/2016 FINDINGS: Lower chest: Stable subpleural  reticulation in the left lower lobe consistent with scarring. No consolidation. No pleural fluid. Hepatobiliary: Tiny hypodensity in the right lobe of the liver measures 4 mm, image 16 series 2. No other focal hepatic lesion. Clips in the gallbladder fossa postcholecystectomy. No biliary dilatation. Pancreas: No ductal dilatation or inflammation. Spleen: Normal in size without focal abnormality. Adrenals/Urinary Tract: No adrenal nodule. No hydronephrosis or perinephric edema. Symmetric renal enhancement and excretion on delayed phase imaging. Subcentimeter cortical hypodensities within both kidneys, too small to characterize. Urinary bladder is physiologically distended. Stomach/Bowel: Stomach is physiologically distended, no evidence of gastric wall thickening. Small bowel is decompressed, no evidence of wall thickening or inflammation. Enteric contrast is seen throughout the colon. There is no colonic wall thickening or pericolonic no significant diverticular disease, possible single diverticulum in the descending colon per inflammation. Vascular/Lymphatic: Advanced atherosclerosis. No aneurysm. Stents in the common iliac arteries. No evidence of acute vascular finding. No retroperitoneal, pelvic or upper abdominal adenopathy. Small mesenteric lymph nodes are not enlarged by size criteria. Reproductive: Status post hysterectomy. No adnexal masses. Other: No free air, free fluid, or intra-abdominal fluid collection. Musculoskeletal: There are no acute or suspicious osseous abnormalities. No blastic or destructive lytic lesions. IMPRESSION: 1. No etiology to explain GI bleed. No colonic or bowel wall thickening. Possible single descending colonic diverticulum without additional diverticular change. 2. No acute abnormality. 3. Tiny 4 mm hypodensity in the right lobe of the liver. This  was not seen on prior PET, however possibly due to noncontrast technique. Recommend attention to this at follow-up given history of  lung cancer. 4. Intra-abdominal atherosclerosis and bilateral iliac stenting. Electronically Signed   By: Jeb Levering M.D.   On: 09/15/2016 21:20      Assessment & Plan   1. Hematochezia and RLQ, both improved. CT showed possible descending colon diverticulum. No evidence for ischemic or other colitis. Possible diverticular bleed or mild ischemic colitis without CT changes. Advance diet to full liquids. Begin Bentyl 10 mg ac and hs. Trend CBC and monitor bowel movements. Dr. Benson Norway to resume care tomorrow and plan any further evaluation.   Active Problems:   COPD GOLD II   Peripheral vascular disease (HCC)   CAD (coronary artery disease)   Essential hypertension   GERD (gastroesophageal reflux disease)   Primary cancer of right lower lobe of lung (HCC)   Bronchitis, acute   BRBPR (bright red blood per rectum)   LOS: 0 days   [name] Plan MD 09/16/2016, 1:13 PM

## 2016-09-16 NOTE — Care Management Obs Status (Signed)
[place] NOTIFICATION   Patient Details  Name: [name] MRN: [id number] Date of Birth: 21-Sep-1940   Medicare Observation Status Notification Given:  Yes    Erenest Rasher, RN 09/16/2016, 6:03 PM

## 2016-09-16 NOTE — Progress Notes (Signed)
PROGRESS NOTE  [name] JKD:[id number] DOB: 1941/05/01 DOA: 09/15/2016 PCP: [name], MD   LOS: 0 days   Brief Narrative: 76 y.o. female,with history of PVD, HTN, MI, prior lung cancer, admitted 2/3 with bright red blood per rectum x 3 episodes. GI consulted  Assessment & Plan: Active Problems:   COPD GOLD II   Peripheral vascular disease (HCC)   CAD (coronary artery disease)   Essential hypertension   GERD (gastroesophageal reflux disease)   Primary cancer of right lower lobe of lung (HCC)   Bronchitis, acute   BRBPR (bright red blood per rectum)   Hematochezia - She reports 2 episodes significant amount of pure blood 2/3, and workup by Dr. Benson Norway in the past included an EGD and a colonoscopy in October 2017 without any acute findings. Gastroenterology was consulted. She underwent a CT scan of the abdomen and pelvis to evaluate for ischemic colitis, and this appeared to be negative for that. We'll advance her diet today, continue to monitor, Dr. Benson Norway will see her tomorrow. She was seen by [facility] gastroenterology over the weekend as they are covering for him - Continue to hold aspirin and Plavix  History of CAD status post CABG - She denies any chest pain or shortness of breath, continue to hold aspirin and Plavix, continue with statin  Peripheral vascular disease - post stenting in the past, continue to hold aspirin and Plavix given GI bleed  Hypertension - Continue with home medication  Hyperlipidemia - Continue with statin  COPD - No active wheezing, continue with when necessary albuterol, patient started on amoxicillin by her PCP for acute bronchitis treatment, will continue the course  Lung cancer - status post radiation, she is following with Dr. Inda Merlin, appointment scheduled for end of this month   DVT prophylaxis: SCDs Code Status: Full code Family Communication: daughter bedside Disposition Plan: home 1-2 days per GI  Consultants:    GI  Procedures:   None   Antimicrobials:  None    Subjective: - no chest pain, shortness of breath, no abdominal pain, nausea or vomiting.   Objective: Vitals:   09/16/16 0511 09/16/16 0948 09/16/16 0949 09/16/16 1334  BP: (!) 131/50 (!) 183/62 (!) 183/62 (!) 146/59  Pulse: 64   (!) 54  Resp: 18   16  Temp: 98.7 F (37.1 C)   98.9 F (37.2 C)  TempSrc: Oral   Oral  SpO2: 98%   98%  Weight:      Height:        Intake/Output Summary (Last 24 hours) at 09/16/16 1401 Last data filed at 09/16/16 1248  Gross per 24 hour  Intake             2515 ml  Output                0 ml  Net             2515 ml   Filed Weights   09/15/16 1156 09/15/16 1700  Weight: 74.8 kg (165 lb) 74.8 kg (165 lb)    Examination: Constitutional: NAD Vitals:   09/16/16 0511 09/16/16 0948 09/16/16 0949 09/16/16 1334  BP: (!) 131/50 (!) 183/62 (!) 183/62 (!) 146/59  Pulse: 64   (!) 54  Resp: 18   16  Temp: 98.7 F (37.1 C)   98.9 F (37.2 C)  TempSrc: Oral   Oral  SpO2: 98%   98%  Weight:      Height:  Eyes: lids and conjunctivae normal Respiratory: clear to auscultation bilaterally, no wheezing, no crackles Cardiovascular: Regular rate and rhythm, no murmurs / rubs / gallops.  Abdomen: no tenderness. Bowel sounds positive.  Musculoskeletal: no clubbing / cyanosis.  Neurologic: non focal    Data Reviewed: I have personally reviewed following labs and imaging studies  CBC:  Recent Labs Lab 09/15/16 1249 09/15/16 2048 09/16/16 0414  WBC 7.8  --  5.9  HGB 11.1* 10.5* 10.9*  HCT 32.3* 30.6* 31.3*  MCV 85.0  --  85.5  PLT 225  --  557   Basic Metabolic Panel:  Recent Labs Lab 09/15/16 1249 09/16/16 0414  NA 140 139  K 3.8 3.8  CL 108 108  CO2 25 24  GLUCOSE 106* 101*  BUN 13 9  CREATININE 1.07* 0.95  CALCIUM 8.8* 8.6*   GFR: Estimated Creatinine Clearance: 50 mL/min (by C-G formula based on SCr of 0.95 mg/dL). Liver Function Tests:  Recent Labs Lab  09/15/16 1249  AST 17  ALT 12*  ALKPHOS 70  BILITOT 0.4  PROT 6.9  ALBUMIN 3.8   No results for input(s): LIPASE, AMYLASE in the last 168 hours. No results for input(s): AMMONIA in the last 168 hours. Coagulation Profile: No results for input(s): INR, PROTIME in the last 168 hours. Cardiac Enzymes: No results for input(s): CKTOTAL, CKMB, CKMBINDEX, TROPONINI in the last 168 hours. BNP (last 3 results) No results for input(s): PROBNP in the last 8760 hours. HbA1C: No results for input(s): HGBA1C in the last 72 hours. CBG: No results for input(s): GLUCAP in the last 168 hours. Lipid Profile: No results for input(s): CHOL, HDL, LDLCALC, TRIG, CHOLHDL, LDLDIRECT in the last 72 hours. Thyroid Function Tests: No results for input(s): TSH, T4TOTAL, FREET4, T3FREE, THYROIDAB in the last 72 hours. Anemia Panel: No results for input(s): VITAMINB12, FOLATE, FERRITIN, TIBC, IRON, RETICCTPCT in the last 72 hours. Urine analysis:    Component Value Date/Time   COLORURINE YELLOW 02/24/2016 1905   APPEARANCEUR CLEAR 02/24/2016 1905   LABSPEC 1.019 02/24/2016 1905   PHURINE 5.5 02/24/2016 1905   GLUCOSEU NEGATIVE 02/24/2016 1905   HGBUR NEGATIVE 02/24/2016 1905   BILIRUBINUR NEGATIVE 02/24/2016 1905   KETONESUR NEGATIVE 02/24/2016 1905   PROTEINUR NEGATIVE 02/24/2016 1905   UROBILINOGEN 0.2 04/02/2013 0232   NITRITE NEGATIVE 02/24/2016 1905   LEUKOCYTESUR NEGATIVE 02/24/2016 1905   Sepsis Labs: Invalid input(s): PROCALCITONIN, LACTICIDVEN  No results found for this or any previous visit (from the past 240 hour(s)).    Radiology Studies: Ct Abdomen Pelvis W Contrast  Result Date: 09/15/2016 CLINICAL DATA:  Hematochezia. EXAM: CT ABDOMEN AND PELVIS WITH CONTRAST TECHNIQUE: Multidetector CT imaging of the abdomen and pelvis was performed using the standard protocol following bolus administration of intravenous contrast. CONTRAST:  100 cc Isovue-300 IV COMPARISON:  PET-CT 03/14/2016  FINDINGS: Lower chest: Stable subpleural reticulation in the left lower lobe consistent with scarring. No consolidation. No pleural fluid. Hepatobiliary: Tiny hypodensity in the right lobe of the liver measures 4 mm, image 16 series 2. No other focal hepatic lesion. Clips in the gallbladder fossa postcholecystectomy. No biliary dilatation. Pancreas: No ductal dilatation or inflammation. Spleen: Normal in size without focal abnormality. Adrenals/Urinary Tract: No adrenal nodule. No hydronephrosis or perinephric edema. Symmetric renal enhancement and excretion on delayed phase imaging. Subcentimeter cortical hypodensities within both kidneys, too small to characterize. Urinary bladder is physiologically distended. Stomach/Bowel: Stomach is physiologically distended, no evidence of gastric wall thickening. Small bowel is decompressed, no  evidence of wall thickening or inflammation. Enteric contrast is seen throughout the colon. There is no colonic wall thickening or pericolonic no significant diverticular disease, possible single diverticulum in the descending colon per inflammation. Vascular/Lymphatic: Advanced atherosclerosis. No aneurysm. Stents in the common iliac arteries. No evidence of acute vascular finding. No retroperitoneal, pelvic or upper abdominal adenopathy. Small mesenteric lymph nodes are not enlarged by size criteria. Reproductive: Status post hysterectomy. No adnexal masses. Other: No free air, free fluid, or intra-abdominal fluid collection. Musculoskeletal: There are no acute or suspicious osseous abnormalities. No blastic or destructive lytic lesions. IMPRESSION: 1. No etiology to explain GI bleed. No colonic or bowel wall thickening. Possible single descending colonic diverticulum without additional diverticular change. 2. No acute abnormality. 3. Tiny 4 mm hypodensity in the right lobe of the liver. This was not seen on prior PET, however possibly due to noncontrast technique. Recommend  attention to this at follow-up given history of lung cancer. 4. Intra-abdominal atherosclerosis and bilateral iliac stenting. Electronically Signed   By: Jeb Levering M.D.   On: 09/15/2016 21:20     Scheduled Meds: . amLODipine  5 mg Oral Daily   And  . irbesartan  300 mg Oral Daily  . amoxicillin  875 mg Oral BID  . cloNIDine  0.05 mg Oral BID  . dicyclomine  10 mg Oral TID AC & HS  . pantoprazole  40 mg Oral Daily  . [START ON 09/17/2016] prednisoLONE acetate  1 drop Right Eye QODAY  . rosuvastatin  10 mg Oral QHS   Continuous Infusions: . sodium chloride 75 mL/hr at 09/15/16 [place], MD, PhD [facility] Pager [phone]  If 7PM-7AM, please contact night-coverage [email or web address] Password [email or web address] 09/16/2016, 2:01 PM

## 2016-09-17 DIAGNOSIS — K922 Gastrointestinal hemorrhage, unspecified: Secondary | ICD-10-CM | POA: Diagnosis not present

## 2016-09-17 DIAGNOSIS — K625 Hemorrhage of anus and rectum: Secondary | ICD-10-CM | POA: Diagnosis not present

## 2016-09-17 LAB — BASIC METABOLIC PANEL
Anion gap: 4 — ABNORMAL LOW (ref 5–15)
BUN: 7 mg/dL (ref 6–20)
CHLORIDE: 111 mmol/L (ref 101–111)
CO2: 27 mmol/L (ref 22–32)
Calcium: 8.6 mg/dL — ABNORMAL LOW (ref 8.9–10.3)
Creatinine, Ser: 0.78 mg/dL (ref 0.44–1.00)
GFR calc non Af Amer: >60 mL/min (ref >60)
Glucose, Bld: 104 mg/dL — ABNORMAL HIGH (ref 65–99)
POTASSIUM: 4.1 mmol/L (ref 3.5–5.1)
Sodium: 142 mmol/L (ref 135–145)

## 2016-09-17 LAB — CBC
HEMATOCRIT: 30.5 % — AB (ref 36.0–46.0)
Hemoglobin: 10.1 g/dL — ABNORMAL LOW (ref 12.0–15.0)
MCH: 28.6 pg (ref 26.0–34.0)
MCHC: 33.1 g/dL (ref 30.0–36.0)
MCV: 86.4 fL (ref 78.0–100.0)
Platelets: 206 10*3/uL (ref 150–400)
RBC: 3.53 MIL/uL — AB (ref 3.87–5.11)
RDW: 16.7 % — ABNORMAL HIGH (ref 11.5–15.5)
WBC: 5.7 10*3/uL (ref 4.0–10.5)

## 2016-09-17 MED ORDER — DICYCLOMINE HCL 10 MG PO CAPS: 10.0000 mg | ORAL_CAPSULE | Freq: Three times a day (TID) | ORAL | 1 refills | Status: DC

## 2016-09-17 MED ORDER — ASPIRIN EC 81 MG PO TBEC: 81.0000 mg | DELAYED_RELEASE_TABLET | Freq: Every day | ORAL | 0 refills | Status: DC

## 2016-09-17 NOTE — Progress Notes (Signed)
Subjective: Feeling well.  No complaints at this time.  No further bleeding and her stools are normalizing.  Objective: Vital signs in last 24 hours: Temp:  [98 F (36.7 C)-99.1 F (37.3 C)] 98 F (36.7 C) (02/05 0408) Pulse Rate:  [51-60] 51 (02/05 0408) Resp:  [16-18] 16 (02/05 0408) BP: (118-183)/(45-62) 118/45 (02/05 0408) SpO2:  [98 %-100 %] 100 % (02/05 0408) Last BM Date: 09/16/16  Intake/Output from previous day: 02/04 0701 - 02/05 0700 In: 2645 [P.O.:820; I.V.:1825] Out: -  Intake/Output this shift: No intake/output data recorded.  General appearance: alert and no distress GI: soft, non-tender; bowel sounds normal; no masses,  no organomegaly  Lab Results:  Recent Labs  09/15/16 1249 09/15/16 2048 09/16/16 0414 09/17/16 0420  WBC 7.8  --  5.9 5.7  HGB 11.1* 10.5* 10.9* 10.1*  HCT 32.3* 30.6* 31.3* 30.5*  PLT 225  --  205 206   BMET  Recent Labs  09/15/16 1249 09/16/16 0414 09/17/16 0420  NA 140 139 142  K 3.8 3.8 4.1  CL 108 108 111  CO2 '25 24 27  '$ GLUCOSE 106* 101* 104*  BUN '13 9 7  '$ CREATININE 1.07* 0.95 0.78  CALCIUM 8.8* 8.6* 8.6*   LFT  Recent Labs  09/15/16 1249  PROT 6.9  ALBUMIN 3.8  AST 17  ALT 12*  ALKPHOS 70  BILITOT 0.4   PT/INR No results for input(s): LABPROT, INR in the last 72 hours. Hepatitis Panel No results for input(s): HEPBSAG, HCVAB, HEPAIGM, HEPBIGM in the last 72 hours. C-Diff No results for input(s): CDIFFTOX in the last 72 hours. Fecal Lactopherrin No results for input(s): FECLLACTOFRN in the last 72 hours.  Studies/Results: Ct Abdomen Pelvis W Contrast  Result Date: 09/15/2016 CLINICAL DATA:  Hematochezia. EXAM: CT ABDOMEN AND PELVIS WITH CONTRAST TECHNIQUE: Multidetector CT imaging of the abdomen and pelvis was performed using the standard protocol following bolus administration of intravenous contrast. CONTRAST:  100 cc Isovue-300 IV COMPARISON:  PET-CT 03/14/2016 FINDINGS: Lower chest: Stable subpleural  reticulation in the left lower lobe consistent with scarring. No consolidation. No pleural fluid. Hepatobiliary: Tiny hypodensity in the right lobe of the liver measures 4 mm, image 16 series 2. No other focal hepatic lesion. Clips in the gallbladder fossa postcholecystectomy. No biliary dilatation. Pancreas: No ductal dilatation or inflammation. Spleen: Normal in size without focal abnormality. Adrenals/Urinary Tract: No adrenal nodule. No hydronephrosis or perinephric edema. Symmetric renal enhancement and excretion on delayed phase imaging. Subcentimeter cortical hypodensities within both kidneys, too small to characterize. Urinary bladder is physiologically distended. Stomach/Bowel: Stomach is physiologically distended, no evidence of gastric wall thickening. Small bowel is decompressed, no evidence of wall thickening or inflammation. Enteric contrast is seen throughout the colon. There is no colonic wall thickening or pericolonic no significant diverticular disease, possible single diverticulum in the descending colon per inflammation. Vascular/Lymphatic: Advanced atherosclerosis. No aneurysm. Stents in the common iliac arteries. No evidence of acute vascular finding. No retroperitoneal, pelvic or upper abdominal adenopathy. Small mesenteric lymph nodes are not enlarged by size criteria. Reproductive: Status post hysterectomy. No adnexal masses. Other: No free air, free fluid, or intra-abdominal fluid collection. Musculoskeletal: There are no acute or suspicious osseous abnormalities. No blastic or destructive lytic lesions. IMPRESSION: 1. No etiology to explain GI bleed. No colonic or bowel wall thickening. Possible single descending colonic diverticulum without additional diverticular change. 2. No acute abnormality. 3. Tiny 4 mm hypodensity in the right lobe of the liver. This was not seen on  prior PET, however possibly due to noncontrast technique. Recommend attention to this at follow-up given history of  lung cancer. 4. Intra-abdominal atherosclerosis and bilateral iliac stenting. Electronically Signed   By: Jeb Levering M.D.   On: 09/15/2016 21:20    Medications:  Scheduled: . amLODipine  5 mg Oral Daily   And  . irbesartan  300 mg Oral Daily  . amoxicillin  875 mg Oral BID  . cloNIDine  0.05 mg Oral BID  . dicyclomine  10 mg Oral TID AC & HS  . pantoprazole  40 mg Oral Daily  . prednisoLONE acetate  1 drop Right Eye QODAY  . rosuvastatin  10 mg Oral QHS   Continuous: . sodium chloride 1,000 mL (09/16/16 1859)    Assessment/Plan: 1) Hematochezia - ? Etiology.  Unclear if she had ischemic colitis or a diverticular bleed.   2) RLQ abdominal pain - resolved. 3) Vasculopath. 4) Anemia.   Currently she reports that she is back to baseline.  No further bleeding since yesterday and her HGB is stable.    Plan: 1) Follow up in the office in 2 weeks. 2) If there is rebleeding, I will repeat the colonoscopy. 3) Okay to D/C home.  LOS: 0 days   [name] D 09/17/2016, 7:42 AM

## 2016-09-17 NOTE — Discharge Summary (Addendum)
Physician Discharge Summary  DAISY LITES MRN: [id number] DOB/AGE: 1940/09/27 76 y.o.  PCP: [name], MD   Admit date: 09/15/2016 Discharge date: 09/17/2016  Discharge Diagnoses:    Active Problems:   COPD GOLD II   Peripheral vascular disease (HCC)   CAD (coronary artery disease)   Essential hypertension   GERD (gastroesophageal reflux disease)   Primary cancer of right lower lobe of lung (HCC)   Bronchitis, acute   BRBPR (bright red blood per rectum)   Acute lower GI bleeding    Follow-up recommendations Follow-up with PCP in 3-5 days , including all  additional recommended appointments as below Follow-up CBC, CMP in 3-5 days Follow up with Carol Ada, MD, in the office in 2 weeks Recommended to hold aspirin for 3 days pending follow-up CBC with primary care provider    Current Discharge Medication List    START taking these medications   Details  dicyclomine (BENTYL) 10 MG capsule Take 1 capsule (10 mg total) by mouth 4 (four) times daily -  before meals and at bedtime. Qty: 120 capsule, Refills: 1      CONTINUE these medications which have CHANGED   Details  aspirin EC 81 MG tablet Take 1 tablet (81 mg total) by mouth daily. Qty: 30 tablet, Refills: 0      CONTINUE these medications which have NOT CHANGED   Details  acetaminophen (TYLENOL) 500 MG tablet Take 500 mg by mouth every 6 (six) hours as needed.    albuterol (VENTOLIN HFA) 108 (90 BASE) MCG/ACT inhaler Inhale 2 puffs into the lungs every 4 (four) hours as needed for wheezing or shortness of breath (((PLAN B))). Qty: 1 Inhaler, Refills: 11    amLODipine-olmesartan (AZOR) 5-40 MG tablet Take 1 tablet by mouth daily.    amoxicillin (AMOXIL) 875 MG tablet Take 875 mg by mouth 2 (two) times daily. Started 1/29 x 10 days Refills: 0    cloNIDine (CATAPRES) 0.1 MG tablet Take 0.05 mg by mouth 2 (two) times daily.     clopidogrel (PLAVIX) 75 MG tablet Take 1 tablet (75 mg total) by mouth  daily. Qty: 90 tablet, Refills: 3   Associated Diagnoses: Peripheral vascular disease, unspecified    dexlansoprazole (DEXILANT) 60 MG capsule Take 1 capsule (60 mg total) by mouth daily before breakfast. Qty: 30 capsule, Refills: 11    prednisoLONE acetate (PRED FORTE) 1 % ophthalmic suspension Place 1 drop into the right eye every other day.     rosuvastatin (CRESTOR) 10 MG tablet Take 10 mg by mouth at bedtime.     fluticasone-salmeterol (ADVAIR HFA) 115-21 MCG/ACT inhaler Inhale 2 puffs into the lungs 2 (two) times daily. Qty: 1 Inhaler, Refills: 12    ipratropium-albuterol (DUONEB) 0.5-2.5 (3) MG/3ML SOLN Take 3 mLs by nebulization 4 (four) times daily. Qty: 360 mL, Refills: 0    nitroGLYCERIN (NITROSTAT) 0.4 MG SL tablet Place 0.4 mg under the tongue every 5 (five) minutes as needed for chest pain.       STOP taking these medications     ketorolac (TORADOL) 10 MG tablet            Discharge Condition: Stable   Discharge Instructions Get Medicines reviewed and adjusted: Please take all your medications with you for your next visit with your Primary MD  Please request your Primary MD to go over all hospital tests and procedure/radiological results at the follow up, please ask your Primary MD to get all Hospital records sent to his/her  office.  If you experience worsening of your admission symptoms, develop shortness of breath, life threatening emergency, suicidal or homicidal thoughts you must seek medical attention immediately by calling 911 or calling your MD immediately if symptoms less severe.  You must read complete instructions/literature along with all the possible adverse reactions/side effects for all the Medicines you take and that have been prescribed to you. Take any new Medicines after you have completely understood and accpet all the possible adverse reactions/side effects.   Do not drive when taking Pain medications.   Do not take more than prescribed  Pain, Sleep and Anxiety Medications  Special Instructions: If you have smoked or chewed Tobacco in the last 2 yrs please stop smoking, stop any regular Alcohol and or any Recreational drug use.  Wear Seat belts while driving.  Please note  You were cared for by a hospitalist during your hospital stay. Once you are discharged, your primary care physician will handle any further medical issues. Please note that NO REFILLS for any discharge medications will be authorized once you are discharged, as it is imperative that you return to your primary care physician (or establish a relationship with a primary care physician if you do not have one) for your aftercare needs so that they can reassess your need for medications and monitor your lab values.     Allergies  Allergen Reactions  . Hydromorphone Palpitations and Other (See Comments)    DILAUDID  -  Pt had a Heart Attack after taking Dilaudid.  . Levaquin [Levofloxacin] Other (See Comments)    Chest pressure, SOB, "pain in between shoulder blades", sweaty -as reported by patient per experience in ED this afternoon  . Codeine Other (See Comments)    Dr. Terrence Dupont advised patient not to take this medication  . Doxycycline Swelling    Mouth, lips, feet swelling  . Oxycodone-Acetaminophen Other (See Comments)    Says it makes her feel weird  . Risedronate Other (See Comments)    Chest pain  . Avelox [Moxifloxacin Hcl In Nacl] Palpitations      Disposition: 01-Home or Self Care   Consults:  GI     Significant Diagnostic Studies:  Ct Abdomen Pelvis W Contrast  Result Date: 09/15/2016 CLINICAL DATA:  Hematochezia. EXAM: CT ABDOMEN AND PELVIS WITH CONTRAST TECHNIQUE: Multidetector CT imaging of the abdomen and pelvis was performed using the standard protocol following bolus administration of intravenous contrast. CONTRAST:  100 cc Isovue-300 IV COMPARISON:  PET-CT 03/14/2016 FINDINGS: Lower chest: Stable subpleural reticulation in the  left lower lobe consistent with scarring. No consolidation. No pleural fluid. Hepatobiliary: Tiny hypodensity in the right lobe of the liver measures 4 mm, image 16 series 2. No other focal hepatic lesion. Clips in the gallbladder fossa postcholecystectomy. No biliary dilatation. Pancreas: No ductal dilatation or inflammation. Spleen: Normal in size without focal abnormality. Adrenals/Urinary Tract: No adrenal nodule. No hydronephrosis or perinephric edema. Symmetric renal enhancement and excretion on delayed phase imaging. Subcentimeter cortical hypodensities within both kidneys, too small to characterize. Urinary bladder is physiologically distended. Stomach/Bowel: Stomach is physiologically distended, no evidence of gastric wall thickening. Small bowel is decompressed, no evidence of wall thickening or inflammation. Enteric contrast is seen throughout the colon. There is no colonic wall thickening or pericolonic no significant diverticular disease, possible single diverticulum in the descending colon per inflammation. Vascular/Lymphatic: Advanced atherosclerosis. No aneurysm. Stents in the common iliac arteries. No evidence of acute vascular finding. No retroperitoneal, pelvic or upper abdominal adenopathy. Small  mesenteric lymph nodes are not enlarged by size criteria. Reproductive: Status post hysterectomy. No adnexal masses. Other: No free air, free fluid, or intra-abdominal fluid collection. Musculoskeletal: There are no acute or suspicious osseous abnormalities. No blastic or destructive lytic lesions. IMPRESSION: 1. No etiology to explain GI bleed. No colonic or bowel wall thickening. Possible single descending colonic diverticulum without additional diverticular change. 2. No acute abnormality. 3. Tiny 4 mm hypodensity in the right lobe of the liver. This was not seen on prior PET, however possibly due to noncontrast technique. Recommend attention to this at follow-up given history of lung cancer. 4.  Intra-abdominal atherosclerosis and bilateral iliac stenting. Electronically Signed   By: Jeb Levering M.D.   On: 09/15/2016 21:20       Filed Weights   09/15/16 1156 09/15/16 1700  Weight: 74.8 kg (165 lb) 74.8 kg (165 lb)     Microbiology: No results found for this or any previous visit (from the past 240 hour(s)).     Blood Culture    Component Value Date/Time   SDES URINE, CLEAN CATCH 10/22/2015 1424   SPECREQUEST NONE 10/22/2015 1424   CULT  10/22/2015 1424    MULTIPLE SPECIES PRESENT, SUGGEST RECOLLECTION Performed at [facility] 10/24/2015 FINAL 10/22/2015 1424      Labs: Results for orders placed or performed during the hospital encounter of 09/15/16 (from the past 48 hour(s))  Comprehensive metabolic panel     Status: Abnormal   Collection Time: 09/15/16 12:49 PM  Result Value Ref Range   Sodium 140 135 - 145 mmol/L   Potassium 3.8 3.5 - 5.1 mmol/L   Chloride 108 101 - 111 mmol/L   CO2 25 22 - 32 mmol/L   Glucose, Bld 106 (H) 65 - 99 mg/dL   BUN 13 6 - 20 mg/dL   Creatinine, Ser 1.07 (H) 0.44 - 1.00 mg/dL   Calcium 8.8 (L) 8.9 - 10.3 mg/dL   Total Protein 6.9 6.5 - 8.1 g/dL   Albumin 3.8 3.5 - 5.0 g/dL   AST 17 15 - 41 U/L   ALT 12 (L) 14 - 54 U/L   Alkaline Phosphatase 70 38 - 126 U/L   Total Bilirubin 0.4 0.3 - 1.2 mg/dL   GFR calc non Af Amer 50 (L) >60 mL/min   GFR calc Af Amer 58 (L) >60 mL/min    Comment: (NOTE) The eGFR has been calculated using the CKD EPI equation. This calculation has not been validated in all clinical situations. eGFR's persistently <60 mL/min signify possible Chronic Kidney Disease.    Anion gap 7 5 - 15  CBC     Status: Abnormal   Collection Time: 09/15/16 12:49 PM  Result Value Ref Range   WBC 7.8 4.0 - 10.5 K/uL   RBC 3.80 (L) 3.87 - 5.11 MIL/uL   Hemoglobin 11.1 (L) 12.0 - 15.0 g/dL   HCT 32.3 (L) 36.0 - 46.0 %   MCV 85.0 78.0 - 100.0 fL   MCH 29.2 26.0 - 34.0 pg   MCHC 34.4 30.0 -  36.0 g/dL   RDW 16.8 (H) 11.5 - 15.5 %   Platelets 225 150 - 400 K/uL  Type and screen [place]     Status: None   Collection Time: 09/15/16 12:49 PM  Result Value Ref Range   ABO/RH(D) A POS    Antibody Screen NEG    Sample Expiration 09/18/2016   ABO/Rh     Status:  None   Collection Time: 09/15/16  1:00 PM  Result Value Ref Range   ABO/RH(D) A POS   POC occult blood, ED     Status: Abnormal   Collection Time: 09/15/16  1:39 PM  Result Value Ref Range   Fecal Occult Bld POSITIVE (A) NEGATIVE  Lactic acid, plasma     Status: None   Collection Time: 09/15/16  3:45 PM  Result Value Ref Range   Lactic Acid, Venous 0.9 0.5 - 1.9 mmol/L  Hemoglobin and hematocrit, blood     Status: Abnormal   Collection Time: 09/15/16  8:48 PM  Result Value Ref Range   Hemoglobin 10.5 (L) 12.0 - 15.0 g/dL   HCT 30.6 (L) 36.0 - 32.1 %  Basic metabolic panel     Status: Abnormal   Collection Time: 09/16/16  4:14 AM  Result Value Ref Range   Sodium 139 135 - 145 mmol/L   Potassium 3.8 3.5 - 5.1 mmol/L   Chloride 108 101 - 111 mmol/L   CO2 24 22 - 32 mmol/L   Glucose, Bld 101 (H) 65 - 99 mg/dL   BUN 9 6 - 20 mg/dL   Creatinine, Ser 0.95 0.44 - 1.00 mg/dL   Calcium 8.6 (L) 8.9 - 10.3 mg/dL   GFR calc non Af Amer 58 (L) >60 mL/min   GFR calc Af Amer >60 >60 mL/min    Comment: (NOTE) The eGFR has been calculated using the CKD EPI equation. This calculation has not been validated in all clinical situations. eGFR's persistently <60 mL/min signify possible Chronic Kidney Disease.    Anion gap 7 5 - 15  CBC     Status: Abnormal   Collection Time: 09/16/16  4:14 AM  Result Value Ref Range   WBC 5.9 4.0 - 10.5 K/uL   RBC 3.66 (L) 3.87 - 5.11 MIL/uL   Hemoglobin 10.9 (L) 12.0 - 15.0 g/dL   HCT 31.3 (L) 36.0 - 46.0 %   MCV 85.5 78.0 - 100.0 fL   MCH 29.8 26.0 - 34.0 pg   MCHC 34.8 30.0 - 36.0 g/dL   RDW 16.9 (H) 11.5 - 15.5 %   Platelets 205 150 - 400 K/uL  Basic metabolic  panel     Status: Abnormal   Collection Time: 09/17/16  4:20 AM  Result Value Ref Range   Sodium 142 135 - 145 mmol/L   Potassium 4.1 3.5 - 5.1 mmol/L   Chloride 111 101 - 111 mmol/L   CO2 27 22 - 32 mmol/L   Glucose, Bld 104 (H) 65 - 99 mg/dL   BUN 7 6 - 20 mg/dL   Creatinine, Ser 0.78 0.44 - 1.00 mg/dL   Calcium 8.6 (L) 8.9 - 10.3 mg/dL   GFR calc non Af Amer >60 >60 mL/min   GFR calc Af Amer >60 >60 mL/min    Comment: (NOTE) The eGFR has been calculated using the CKD EPI equation. This calculation has not been validated in all clinical situations. eGFR's persistently <60 mL/min signify possible Chronic Kidney Disease.    Anion gap 4 (L) 5 - 15  CBC     Status: Abnormal   Collection Time: 09/17/16  4:20 AM  Result Value Ref Range   WBC 5.7 4.0 - 10.5 K/uL   RBC 3.53 (L) 3.87 - 5.11 MIL/uL   Hemoglobin 10.1 (L) 12.0 - 15.0 g/dL   HCT 30.5 (L) 36.0 - 46.0 %   MCV 86.4 78.0 - 100.0 fL   MCH  28.6 26.0 - 34.0 pg   MCHC 33.1 30.0 - 36.0 g/dL   RDW 16.7 (H) 11.5 - 15.5 %   Platelets 206 150 - 400 K/uL     Lipid Panel     Component Value Date/Time   CHOL 153 08/04/2014 0313   TRIG 57 08/04/2014 0313   HDL 49 08/04/2014 0313   CHOLHDL 3.1 08/04/2014 0313   VLDL 11 08/04/2014 0313   LDLCALC 93 08/04/2014 0313     Lab Results  Component Value Date   HGBA1C (H) 12/02/2010    6.0 (NOTE)                                                                       According to the ADA Clinical Practice Recommendations for 2011, when HbA1c is used as a screening test:   >=6.5%   Diagnostic of Diabetes Mellitus           (if abnormal result  is confirmed)  5.7-6.4%   Increased risk of developing Diabetes Mellitus  References:Diagnosis and Classification of Diabetes Mellitus,Diabetes VWPV,9480,16(PVVZS 1):S62-S69 and Standards of Medical Care in         Diabetes - 2011,Diabetes Care,2011,34  (Suppl 1):S11-S61.     Lab Results  Component Value Date   LDLCALC 93 08/04/2014    CREATININE 0.78 09/17/2016     HPI    [name] is a 76 y.o. female with multiple medical problems,  who developed  sudden onset of bright red rectal bleeding x 2 today and presented to [facility] ED. First BM had some stood red blood. Second was all red blood. She reports mild lower abdominal pain which is new today. She has had post prandial nausea for months. She underwent colonoscopy and EGD in 05/2016 for iron deficiency anemia and both were normal. A capsule endoscopy was recommended but was not performed. She had been on Augmentin for 4 days for bronchitis.   HOSPITAL COURSE:   Hematochezia - She reports 2 episodes significant amount of pure blood 2/3, and workup by Dr. Benson Norway in the past included an EGD and a colonoscopy in October 2017 without any acute findings. Gastroenterology was consulted. She underwent a CT scan of the abdomen and pelvis to evaluate for ischemic colitis, and this appeared to be negative for that. Patient seen by Dr. Benson Norway prior to discharge. Etiology of hematochezia is unclear, most likely diverticular bleeding Initially held aspirin and Plavix Bleeding resolved, currently back to baseline, no significant change in hemoglobin GI recommended to follow-up in the office in 2 weeks with repeat colonoscopy if needed Okay to discharge home Recommended to resume Plavix and start aspirin in 3 days Repeat CBC in PCPs office  History of CAD status post CABG-followed by Dr. Criss Rosales - She denies any chest pain or shortness of breath, resume aspirin and Plavix, continue with statin Contact cardiology, if bleeding recurs to see if aspirin and Plavix were both indicated.  Peripheral vascular disease - post stenting in the past, resume aspirin and Plavix    Hypertension - Continue with home medication  Hyperlipidemia - Continue with statin  COPD - No active wheezing, continue with when necessary albuterol, patient started on amoxicillin by her PCP for acute bronchitis  treatment,  will continue the course  Lung cancer - statuspost radiation, she is following with Dr. Inda Merlin, appointment scheduled for end of this month   Discharge Exam:   Blood pressure (!) 118/45, pulse (!) 51, temperature 98 F (36.7 C), temperature source Oral, resp. rate 16, height 5' 2"  (1.575 m), weight 74.8 kg (165 lb), SpO2 100 %.   Eyes: lids and conjunctivae normal Respiratory: clear to auscultation bilaterally, no wheezing, no crackles Cardiovascular: Regular rate and rhythm, no murmurs / rubs / gallops.  Abdomen: no tenderness. Bowel sounds positive.  Musculoskeletal: no clubbing / cyanosis.  Neurologic: non focal    Follow-up Information    BOUSKA,DAVID E, MD. Schedule an appointment as soon as possible for a visit.   Specialty:  Family Medicine Why:  Follow-up with primary care provider in 3-5 days, need repeat CBC, BMP Contact information: [place] [address] [phone]        Beryle Beams, MD. Call.   Specialty:  Gastroenterology Why:  To make appointment, hospital follow-up in 2 weeks Contact information: [place], [place] [address] [phone]           Signed: Reyne Dumas 09/17/2016, 8:27 AM        Time spent >45 mins

## 2016-09-17 NOTE — Progress Notes (Signed)
Discharge instructions explained to pt. Prescription called in to pharmacy. Son here to take pt home.  Discharged via wheelchair.

## 2016-11-08 ENCOUNTER — Encounter: Payer: Self-pay | Admitting: Pulmonary Disease

## 2016-11-08 ENCOUNTER — Other Ambulatory Visit (INDEPENDENT_AMBULATORY_CARE_PROVIDER_SITE_OTHER): Payer: Medicare HMO

## 2016-11-08 ENCOUNTER — Ambulatory Visit (INDEPENDENT_AMBULATORY_CARE_PROVIDER_SITE_OTHER)
Admission: RE | Admit: 2016-11-08 | Discharge: 2016-11-08 | Disposition: A | Discharge status: Home / Self Care | Payer: Medicare HMO | Source: Ambulatory Visit | Attending: Pulmonary Disease | Admitting: Pulmonary Disease

## 2016-11-08 ENCOUNTER — Ambulatory Visit (INDEPENDENT_AMBULATORY_CARE_PROVIDER_SITE_OTHER): Payer: Medicare HMO | Admitting: Pulmonary Disease

## 2016-11-08 VITALS — BP 126/64 | HR 56 | Temp 97.2°F | Ht 62.0 in | Wt 167.5 lb

## 2016-11-08 DIAGNOSIS — I739 Peripheral vascular disease, unspecified: Secondary | ICD-10-CM

## 2016-11-08 DIAGNOSIS — I10 Essential (primary) hypertension: Secondary | ICD-10-CM

## 2016-11-08 DIAGNOSIS — J449 Chronic obstructive pulmonary disease, unspecified: Secondary | ICD-10-CM

## 2016-11-08 DIAGNOSIS — C3431 Malignant neoplasm of lower lobe, right bronchus or lung: Secondary | ICD-10-CM | POA: Diagnosis not present

## 2016-11-08 DIAGNOSIS — R0602 Shortness of breath: Secondary | ICD-10-CM

## 2016-11-08 DIAGNOSIS — D500 Iron deficiency anemia secondary to blood loss (chronic): Secondary | ICD-10-CM

## 2016-11-08 DIAGNOSIS — I2510 Atherosclerotic heart disease of native coronary artery without angina pectoris: Secondary | ICD-10-CM

## 2016-11-08 DIAGNOSIS — R5381 Other malaise: Secondary | ICD-10-CM | POA: Diagnosis not present

## 2016-11-08 DIAGNOSIS — I6523 Occlusion and stenosis of bilateral carotid arteries: Secondary | ICD-10-CM | POA: Diagnosis not present

## 2016-11-08 DIAGNOSIS — Z951 Presence of aortocoronary bypass graft: Secondary | ICD-10-CM | POA: Diagnosis not present

## 2016-11-08 LAB — CBC WITH DIFFERENTIAL/PLATELET
BASOS PCT: 0.5 % (ref 0.0–3.0)
Basophils Absolute: 0.1 10*3/uL (ref 0.0–0.1)
EOS PCT: 0.2 % (ref 0.0–5.0)
Eosinophils Absolute: 0 10*3/uL (ref 0.0–0.7)
HEMATOCRIT: 36.2 % (ref 36.0–46.0)
HEMOGLOBIN: 12.2 g/dL (ref 12.0–15.0)
LYMPHS PCT: 24.9 % (ref 12.0–46.0)
Lymphs Abs: 2.9 10*3/uL (ref 0.7–4.0)
MCHC: 33.8 g/dL (ref 30.0–36.0)
MCV: 90.1 fl (ref 78.0–100.0)
MONO ABS: 0.9 10*3/uL (ref 0.1–1.0)
Monocytes Relative: 7.9 % (ref 3.0–12.0)
NEUTROS ABS: 7.8 10*3/uL — AB (ref 1.4–7.7)
Neutrophils Relative %: 66.5 % (ref 43.0–77.0)
PLATELETS: 318 10*3/uL (ref 150.0–400.0)
RBC: 4.02 Mil/uL (ref 3.87–5.11)
RDW: 13.5 % (ref 11.5–15.5)
WBC: 11.8 10*3/uL — AB (ref 4.0–10.5)

## 2016-11-08 LAB — IBC PANEL
Iron: 97 ug/dL (ref 42–145)
Saturation Ratios: 26.3 % (ref 20.0–50.0)
Transferrin: 263 mg/dL (ref 212.0–360.0)

## 2016-11-08 LAB — TSH: TSH: 2.17 u[IU]/mL (ref 0.35–4.50)

## 2016-11-08 LAB — FERRITIN: Ferritin: 59.1 ng/mL (ref 10.0–291.0)

## 2016-11-08 NOTE — Progress Notes (Signed)
Subjective:     Patient ID: [name], female   DOB: 1940-09-18, 76 y.o.   MRN: [id number]  HPI  ~  October 28, 2015:  Initial pulmonary consult by SN>  PCP is DrBouska    76 y/o WF, [name], w/ hx COPD, HBP, CAD-s/p 3 vessel CABG, severe ASPVD, HL, HH/GERD, Divertics, etc...    She was prev evaluated by DrWert in 2014 & DrGonzalez before that>  She had mult medication intolerances and seemed to do better w/ NEBS; also had hx chr sinusitis & GERD...     Daughter called w/ request for a post-hosp f/u visit>  She was [facility] 3/11 - 10/24/15 by [facility] w/ CAP, COPD exacerbation;  She presented w/ cough, small amt yellow sput, temp to 101, and incr SOB;  ER eval revealed T101.3, & exam w/ decr BS +wheezes and basilar rhonchi;  CXR showed patchy RLL airsp dis, Flu panel neg, unable to get sput for culture, Labs- Cr=1.27, BS~140-190, Hg~10-11 w/ MCV=83;  She was treated w/ O2, IVF, Duoneb, Levaquin & Pred;  Disch on Duoneb via nebulizer Qid, Dulera100-2spBid, Doxy100Bid, Pred taper...     Currently she feels back to baseline w/ sl cough, white sput & no discoloration or blood, +DOE w/ exertion and some ADLs, no CP, palpit, dizzy, edema;  On NEB w/ DUONEB Qid, DULERA100-2spBid, Pred'10mg'$  tapering sched from recent [place] as needed, Zyrtek prn...  Smoking Hx>  Ex-smoker- started age 73, smoked for ~40 yrs up to 1+ppd, and quit in 2002 when she had CABG; total = 45+pack-year smoking history...   Pulmonary Hx>  Hx "asthma", COPD, ex-smoker, hx recurrent bronchits, hx pneumonia 3/17  Medical Hx>  HBP, CAD- s/p 3 vessel CABG in 2002, severe ASPVD w/ Ao-Fem-Pop & left CAE 2011, 78m right MCA aneurysm, HH/GERD/ Divertics, colon polyps, HAs (s/p craniotomy for Arnold-chiari malformation)  Family Hx>  One sis w/ asthma, otherw all atherosclerosis related...  Occup Hx>  No known asbestos or toxic exposures  Current Meds>  ASA/Plavix, AZOR 5/40, Catapress0.1-1/2Bid, Crestor10, Dexilant60,  (not on DM meds- "I'm pre-diabetic")... EXAM shows Afeb, VSS, O2sat=98% on RA; Wt=174#, 5'2"Tall, BMI=31;  HEENT- neg, mallampati1, bilat CBruits & L-CAE scar;  Chest- decrBS, clear w/o w/r/r;  Heart- CABG scar, RR Gr1-2/6 SEM w/o r/g;  Abd- +epig bruit, w/o masses;  Ext- w/o c/ce;  Neuro- w/o focal deficits...   PF reported from 2013>  FEV1=1.30 (58%), %1sec=65, no change post bronchdil, DLCO=74%  CT Angio Chest 08/02/14 showed NEG for PE, extensive CAD- s/p CABG, subsolid nodules in RML/ RLL pleural thickening & bibasilar atx, hepatic steatosis, no adenopathy...  CXR 10/22/15 showed norm heart size, s/p CABG, patchy airsp dis in right base, incr interstitial markings diffusely, biapical scarring...   CXR 10/28/15 shows resolved patchy RLL pneumonia, norm heart size, s/p CABG, atherosclerotic changes in Ao, etc...   Spirometry 10/28/15>  FVC=1.62 (63%), FEV1=1.19 (60%), %1sec=73, mid-flows reduced at 48% predicted;  This is c/w a mild obstructive ventilatory defect & GOLD Stage2 COPD, can't r/o superimposed restriction w/o LV measurement.  Ambulatory Oximetry 10/28/15> O2sat=100% on RA at rest w/ pulse=61;  Pt ambulated 3Laps in the office w/ lowest O2sat=99% w/ pulse=73/min...   LABS reviewed in EPIC>  She needs A1c, Anemia labs, TSH if not checked in the interval by DrBoushka... IMP >>    COPD, GOLD Stage 2 disease    Ex-smoker-- quit 2002, 45+pack-year smoking hx    CAP 3/17-- NOS, resolved w/ Levaquin,  Doxy, but she claims allergic to everything...    Cardiac issues>  HBP, CAD- s/p 3 vessel CABG in 2002, severe ASPVD w/ left CAE 2011, 86m right MCA aneurysm-- followed by DElizabeth Sauer [facility]- DrDickson, IR-DrDeveshwar...    Medical issues>  HBP, HL, pre-diabetes, HH/GERD/ Divertics, colon polyps, HAs (s/p craniotomy for Arnold-Chiari malformation)-- followed by DrBoushka & DrWillis... PLAN >>     We discussed her COPD and recent hosp for CAP;  She is asked to use the NEBULIZER w/ DUONEB  Tid regularly, DULERA100-2spBid, and MUCINEX '600mg'$  1-2tabs bid w/ fluids... She will maintain regular follow up w/ her numerous physicians and plan pulm recheck in 6-8 wks...    ~  Dec 12, 2015:  6wk ROV w/ SN>  CTiannereturns on her NEBULIZER w/ Duoneb Qid, Dulera100-2spBid, & AlbutHFA rescue inhaler as needed; she has finished her prev Pred taper and notes that her breathing is improved & she feels back to baseline- sl cough, small amt whitish sput, min end-exp wheezing, and chr stable DOE, she denies CP/ hemoptysis/ chest tightness/ f/c/s, etc...     COPD, GOLD Stage 2 disease>  Back to baseline on her NEB w/ Duoneb Qid, Dulera100-2spBid, AlbutHFA rescue as needed; Rec to continue same + gradual exercise program & work on wt reduction...    Ex-smoker-- quit 2002, 45+pack-year smoking hx    CAP 3/17-- NOS, resolved w/ Levaquin, Doxy, but she claims allergic to everything...    Cardiac issues>  HBP, CAD- s/p 3 vessel CABG in 2002, severe ASPVD w/ left CAE 2011, 262mright MCA aneurysm-- followed by DrElizabeth Sauer[facility]- DrDickson, IR-DrDeveshwar...    Medical issues>  HBP, HL, borderline diabetes, HH/GERD/ Divertics, colon polyps, HAs (s/p craniotomy for Arnold-Chiari malformation), Anemia-- followed by DrBoushka & DrWillis... EXAM shows Afeb, VSS, O2sat=98% on RA; Wt=172#, 5'2"Tall, BMI=31;  HEENT- neg, mallampati1, bilat CBruits & L-CAE scar;  Chest- decrBS, clear w/o w/r/r;  Heart- CABG scar, RR Gr1-2/6 SEM w/o r/g;  Abd- +epig bruit, w/o masses;  Ext- w/o c/ce;  Neuro- w/o focal deficits...  IMP/PLAN>>  CaCionnas approaching her baseline, s/p pneumonia 10/2015;  Asked to continue NEBs Tid-Qid, Dulera Bid, & rescue inhaler as needed;  She will maintain general med follow up w/ DrBouska, Cards w/ DrHarwani, [facility]- DrDickson, and Neuro- DrWillis;  We plan ROV recheck 3 months...  ADDENDUM>>  [name] to the ER 02/24/16 c/o nausea, vomiting, & diarrhea assoc w/ some crampy abd pain; nothing acute was  found on exam & eval- given Zofran w/ improvement; she also reported SOB & CXR 02/24/16 showed norm heart size, s/pCABG, hyperexpanded lungs, vague nodular opac in left base & right perihilar areas, otherw clear w/o pneumonia or edema;  She followed up w/ DrBoushka & had a CT Chest done 02/28/16 showing scarring in the apicies, and a dominant spiculated 118mosterior RLL nodule (prev CTA in 2015 showed it at 6mm58mseveral other tiny opacities noted, no adenopathy;  A subseq PET-CT 03/14/16 confirmed a hypermetabolic RLL nodule & no other hypermet lesions seen;  She had a needle Bx 04/11/16=> pos for squamous cell ca...     She was seen by DrBartle for TSurg but he felt that she was not an operative candidate based on her COPD & severe vascular pathology & suggested SBRT;  [facility] & she is pending the start of XRT at this time...   FullPFTs 04/10/16 showed FVC=1.53 (57%), FEV1=1.09 (54%), %1sec=71, mid-flows reduced at  42% predicted; post bronchodil FEV1 improved 17% to 1.28L;  TLC=3.77 (79%), RV=2.07 (96%), RV/TLC=55%;  DLCO=51% predicted and the DL/VA=86% predicted...   ~  June 18, 2016:  60moROV w/ SN>  & MFlipporeturns stating "I'm tired of doctors"- not feeling well, tired, freezing, giving out w/ min activ & ADLs, SOB all the time, etc... I've reviewed the recent Epic data>>    She was eval by DrBartle 04/04/16> not felt to be a surg candidate due to her severe vasc dis & underlying COPD; needle bx of RLL lesion was pos for Squamous Cell Ca=> referred for XRT & seen by DrManning...    She completed her XRT w/ DrManning & was seen 05/28/16>  s/p curative SBRT- 54 Gy in 3 fractions (05/2016) & tol well...    She saw DrHung, GI for anemia w/u w/ EGD/ Colon 05/25/16>  EGD was WNL;  Colon was also WNL...    She was seen in [place] 06/04/16 w/ cough & right sided CP x1wk, note reviewed, CXR/ CT Angio/ LABS- all below; she was not given antibiotic & disch for f/u by  PCP...    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     We reviewed the following updated medical problems during today's office visit >>     COPD, GOLD Stage 2 disease (w/ revers component)>  Back to baseline on her NEB w/ Duoneb Qid (she is only doing it prn "I don't need it"), Dulera100-2spBid, AlbutHFA rescue as needed; Rec to continue same + gradual exercise program & work on wt reduction...    RLL Pulm nodule=> Bx 04/11/16= Squamous cell ca & pt eval by DrBartle, DrMohamed, DrManning w/ SBRT given 05/2016 (54 Gy in 3 fractions)...    Ex-smoker-- quit 2002, 45+pack-year smoking hx    CAP 3/17-- NOS, resolved w/ Levaquin, Doxy, but she claims allergic to everything; she also had bronchitis 05/2016 treated w/ Augmentiun & improved...    Cardiac issues>  HBP, CAD- s/p 3 vessel CABG in 2002, severe ASPVD w/ left CAE 2011, 2104mright MCA aneurysm-- followed by DrElizabeth Sauer[facility]- DrDickson, IR-DrDeveshwar...    Medical issues>  HBP, HL, borderline diabetes, HH/GERD/ Divertics, colon polyps, HAs (s/p craniotomy for Arnold-Chiari malformation), Anemia-- followed by DrBoushka & DrWillis... EXAM shows Afeb, VSS, O2sat=100% on RA; Wt=173#, 5'2"Tall, BMI=31;  HEENT- neg, mallampati1, bilat CBruits & L-CAE scar;  Chest- decrBS, clear w/o w/r/r;  Heart- CABG scar, RR Gr1-2/6 SEM w/o r/g;  Abd- +epig bruit, w/o masses;  Ext- w/o c/ce;  Neuro- w/o focal deficits...   CXR 06/04/16>  Norm heart size & prior CABG, 1.4cm RLL nodule, no infiltrate or effusion- NAD...   CT Angio Chest 06/04/16>  s/p CABG & diffuse aortic calcif & heavy  coronary calcif, NEG for PE, stable RLL pulm nodule, no adenopathy, chr changes in the lungs bilat...  LABS 05/2016>  CBC- Hg=9.1, mcv=74;  Chems- Cr=1.30;  Fe=27 (7%sat),  Ferritin=8;  B12=546 IMP/PLAN>>  CaYezeniaas been thru a Mccullough but improved after SBRT for RLL squamous cell ca, recent Feraheme for anemia, and Augmentin for  bronchitis; she is encouraged to stay on the [facility] Bid & use the NEBS as needed; definitely needs to incr physical activity & get her weight down... we plan rov in [place] pulm check...   ~  November 08, 2016:  4-533moOV & post hosp visit/ pulmonary follow up>  CaChandelles followed by PCP- DrBouska & has bee seen 7 times over the interval;  She was HOSP 2/3 - 09/17/16 w/ BRB per rectum assoc w/ mild lower abd pain; prev eval by GI (DrHung) w/ EGD & Colonoscopy 05/2016 done for IDA (both were NEG), capsule endo was rec but not done;  CT Abd & Pelvis was neg for ischemic colitis;  ASA & Plavix was held & she improved- ?etiology (poss divertic bleed)... We reviewed the following medical problems during today's office visit >>     COPD, GOLD Stage 2 disease (w/ revers component)>  Back to baseline on her NEB w/ Duoneb Qid (she is only doing it prn "I don't need it"), Dulera100-2spBid, AlbutHFA rescue as needed; Rec to continue same + gradual exercise program & work on wt reduction... [facility] exac(dyspnea, cough, r-rib pain), treated w/ Amox & Toradol for pain... She is too sedentary 7 SOB w/ ADLs...     RLL Pulm nodule=> Bx 04/11/16= Squamous cell ca & pt eval by DrBartle, DrMohamed, DrManning w/ SBRT given 05/2016 (54 Gy in 3 fractions); Last seen by DrMohamed 08/08/16- Stage 1A non-small-cell lung ca, RLL nodule s/p XRT, & persistent anemia=> given Feraheme 06/2016;  CT Chest 07/2016 showed decr size ofnodule from 1249mo 7mm223mo sign mets; DrM rec f/u scan in 23mo.67mo  Ex-smoker-- quit 2002, 45+pack-year smoking hx    CAP 3/17-- NOS, resolved w/ Levaquin,  Doxy, but she claims allergic to everything; she also had bronchitis 05/2016 treated w/ Augmentiun & improved...    Cardiac issues>  HBP, CAD- s/p 3 vessel CABG in 2002, severe ASPVD w/ bilat SFA stents 2003 & left CAE 2011, 2mm r44mt MCA aneurysm-- followed by DrHarwElizabeth Sauer DrDickson (07/2016), IR-DrDeveshwar... CD[place]ler 07/2016 showed 40-59% R-ICA stenosis & patent L-CAE site w/o restenosis...    Medical issues>  HBP, HL, borderline diabetes, HH/GERD/ Divertics, colon polyps, HAs (s/p craniotomy for Arnold-Chiari malformation), Anemia-- followed by DrBoushka & DrWillis... EXAM shows Afeb, VSS, O2sat=99% on RA; Wt=168#, 5'2"Tall, BMI=31;  HEENT- neg, mallampati1, bilat CBruits & L-CAE scar;  Chest- decrBS, clear w/o w/r/r;  Heart- CABG scar, RR Gr1-2/6 SEM w/o r/g;  Abd- +epig bruit, w/o masses;  Ext- w/o c/ce;  Neuro- w/o focal deficits...   CXR 07/14/16>  S/p CABG, prev RLL nodule not ell vis, stable biapical scarring, NAD...   CT Angio Chest 07/14/16>  NEG for PE, atherosclerosis of Ao/ coronaries/ great vessels; s/p median sternotomy & CABG; no adenopathy, RLL nodule decr to 7mm (p40m 12mm), 73mle scarring lin lingula & LLL, no new lesions...   CXR 11/08/16 (independently reviewed by me in the PACS system) showed norm heart size, s/p CABG, no adenopathy, stable ill-defined RLL nodular opacity (s/p XRT)...  LABS 11/08/16>  CBC- Hg=12.2, mcv=90, Fe=97 (26%sat), Ferritin=59,  TSH=2.17... IMP/PLAN>>  REC to use her NEB (Duoneb) Tid followed by [id number] and start a regular exercise program; continue follow up w/ her PCP-DrBouska & mult specialty physicians...     Past Medical History:  Diagnosis Date  . 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HCC)   .[place]  Arnold-Chiari malformation ([place]) 1998  . Asthma   . 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  . Carotid artery occlusion   . Complication of anesthesia   . COPD (chronic obstructive  pulmonary disease) ([place])   . Coronary artery disease   . 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 Diverticulitis   . Gastroesophageal reflux disease   . Glaucoma   . Hiatal hernia   . Hyperlipidemia   . Hypertension   . Iliac artery aneurysm ([place])   . 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 Myocardial infarction 01/01/2000   Cardiac catheterization  . Peripheral vascular disease ([place])   . PONV (postoperative nausea and vomiting)    occassionally  . Reflux     Past Surgical History:  Procedure Laterality Date  . ABDOMINAL HYSTERECTOMY    . APPENDECTOMY    . Arnold-chiari malformation repair  1998   Suboccipital craniectomy  . CAROTID ENDARTERECTOMY  03/29/2010   Left  CEA  . CHOLECYSTECTOMY     Gall Bladder  . COLONOSCOPY WITH PROPOFOL N/A 04/22/2015   Procedure: COLONOSCOPY WITH PROPOFOL;  Surgeon: Carol Ada, MD;  Location: [facility];  Service: Endoscopy;  Laterality: N/A;  . COLONOSCOPY WITH PROPOFOL N/A 05/25/2016   Procedure: COLONOSCOPY WITH PROPOFOL;  Surgeon: Carol Ada, MD;  Location: [facility];  Service: Endoscopy;  Laterality: N/A;  . CORNEAL TRANSPLANT     Right  . CORONARY ARTERY BYPASS GRAFT  01/01/2000  . ESOPHAGOGASTRODUODENOSCOPY N/A 05/25/2016   Procedure: ESOPHAGOGASTRODUODENOSCOPY (EGD);  Surgeon: Carol Ada, MD;  Location: [facility];  Service: Endoscopy;  Laterality: N/A;  . EYE SURGERY     Laser surgery for retinal hemorrhage  . LEFT HEART CATHETERIZATION WITH CORONARY ANGIOGRAM N/A 08/03/2014   Procedure: LEFT HEART CATHETERIZATION WITH CORONARY ANGIOGRAM;  Surgeon: Birdie Riddle, MD;  Location: [facility];  Service: Cardiovascular;  Laterality: N/A;  . Post Coronary Artery  BPG  01/05/2000   Right jugular sheath removed  . PR VEIN BYPASS GRAFT,AORTO-FEM-POP    . ROTATOR CUFF REPAIR     Right    Outpatient Encounter Prescriptions as of 11/08/2016  Medication Sig  . acetaminophen (TYLENOL) 500 MG tablet Take 500 mg by mouth every 6  (six) hours as needed.  Marland Kitchen albuterol (VENTOLIN HFA) 108 (90 BASE) MCG/ACT inhaler Inhale 2 puffs into the lungs every 4 (four) hours as needed for wheezing or shortness of breath (((PLAN B))).  Marland Kitchen amLODipine-olmesartan (AZOR) 5-40 MG tablet Take 1 tablet by mouth daily.  Marland Kitchen aspirin EC 81 MG tablet Take 1 tablet (81 mg total) by mouth daily.  . cloNIDine (CATAPRES) 0.1 MG tablet Take 0.05 mg by mouth 2 (two) times daily.   . clopidogrel (PLAVIX) 75 MG tablet Take 1 tablet (75 mg total) by mouth daily.  Marland Kitchen dexlansoprazole (DEXILANT) 60 MG capsule Take 1 capsule (60 mg total) by mouth daily before breakfast.  . dicyclomine (BENTYL) 10 MG capsule Take 1 capsule (10 mg total) by mouth 4 (four) times daily -  before meals and at bedtime.  . fluticasone-salmeterol (ADVAIR HFA) 115-21 MCG/ACT inhaler Inhale 2 puffs into the lungs 2 (two) times daily.  Marland Kitchen ipratropium-albuterol (DUONEB) 0.5-2.5 (3) MG/3ML SOLN Take 3 mLs by nebulization 4 (four) times daily.  . nitroGLYCERIN (NITROSTAT) 0.4 MG SL tablet Place 0.4 mg under the tongue every 5 (five) minutes as needed for chest pain.   . prednisoLONE acetate (PRED FORTE) 1 % ophthalmic suspension Place 1 drop into the right eye  every other day.   . rosuvastatin (CRESTOR) 10 MG tablet Take 10 mg by mouth at bedtime.   . [DISCONTINUED] amoxicillin (AMOXIL) 875 MG tablet Take 875 mg by mouth 2 (two) times daily. Started 1/29 x 10 days   No facility-administered encounter medications on file as of 11/08/2016.     Allergies  Allergen Reactions  . Hydromorphone Palpitations and Other (See Comments)    DILAUDID  -  Pt had a Heart Attack after taking Dilaudid.  . Levaquin [Levofloxacin] Other (See Comments)    Chest pressure, SOB, "pain in between shoulder blades", sweaty -as reported by patient per experience in ED this afternoon  . Codeine Other (See Comments)    Dr. Terrence Dupont advised patient not to take this medication  . Doxycycline Swelling    Mouth, lips, feet  swelling  . Oxycodone-Acetaminophen Other (See Comments)    Says it makes her feel weird  . Risedronate Other (See Comments)    Chest pain  . Avelox [Moxifloxacin Hcl In Nacl] Palpitations    Immunization History  Administered Date(s) Administered  . Influenza Whole 05/13/2012  . Influenza,inj,Quad PF,36+ Mos 04/14/2015, 04/16/2016  . Pneumococcal Conjugate-13 04/14/2015  . Pneumococcal Polysaccharide-23 05/13/2012    Current Medications, Allergies, Past Medical History, Past Surgical History, Family History, and Social History were reviewed in [employer] record.   Review of Systems             All symptoms NEG except where BOLDED >>  Constitutional:  F/C/S, fatigue, anorexia, unexpected weight change. HEENT:  HA, visual changes, hearing loss, earache, nasal symptoms, sore throat, mouth sores, hoarseness. Resp:  cough, sputum, hemoptysis; SOB, tightness, wheezing. Cardio:  CP, palpit, DOE, orthopnea, edema. GI:  N/V/D/C, blood in stool; reflux, abd pain, distention, gas. GU:  dysuria, freq, urgency, hematuria, flank pain, voiding difficulty. MS:  joint pain, swelling, tenderness, decr ROM; neck pain, back pain, etc. Neuro:  HA, tremors, seizures, dizziness, syncope, weakness, numbness, gait abn. Skin:  suspicious lesions or skin rash. Heme:  adenopathy, bruising, bleeding. Psyche:  confusion, agitation, sleep disturbance, hallucinations, anxiety, depression suicidal.   Objective:   Physical Exam       Vital Signs:  Reviewed...   General:  WD, overweight, 76 y/o WF in NAD; alert & oriented; pleasant & cooperative... HEENT:  [place]/AT; Conjunctiva- pink, Sclera- nonicteric, EOM-wnl, PERRLA, Fundi-benign; EACs-clear, TMs-wnl; NOSE-clear; THROAT-clear & wnl.  Neck:  Supple w/ decr ROM; no JVD; s/p L-CAE, bilat bruits; no thyromegaly or nodules palpated; no lymphadenopathy.  Chest:  decr BS, clear to P & A; without wheezes, rales, or rhonchi heard. Heart:   Regular Rhythm; norm S1 & S2, Gr1-2/6 SEM, w/o rubs or gallops detected. Abdomen:  Soft & nontender- no guarding or rebound; normal bowel sounds; no organomegaly or masses palpated. Ext:  decrROM; without deformities +arthritic changes; no varicose veins, +venous insuffic, no edema;  Pulses intact w/o bruits. Neuro:  No focal neuro deficits, +gait abn... Derm:  No lesions noted; no rash etc. Lymph:  No cervical, supraclavicular, axillary, or inguinal adenopathy palpated.   Assessment:      IMP >>    COPD, GOLD Stage 2 disease>  Back to baseline on her NEB w/ Duoneb Qid, Dulera100-2spBid, AlbutHFA rescue as needed; Rec to continue same + gradual exercise program & work on wt reduction...    RLL pulm nodule=> Squamous cell ca (Bx 03/2016), s/p XRT 05/2016, [facility] & DrMohamed...    Ex-smoker-- quit 2002, 45+pack-year smoking hx  CAP 3/17-- NOS, resolved w/ Levaquin, Doxy, but she claims allergic to everything...    Cardiac issues>  HBP, CAD- s/p 3 vessel CABG in 2002, severe ASPVD w/ left CAE 2011, 70m right MCA aneurysm-- followed by DElizabeth Sauer [facility]- DrDickson, IR-DrDeveshwar...    Medical issues>  HBP, HL, borderline diabetes, HH/GERD/ Divertics, colon polyps, HAs (s/p craniotomy for Arnold-Chiari malformation), Anemia-- followed by DrBoushka & DrWillis...  PLAN >>  10/28/15>   We discussed her COPD and recent hosp for CAP;  She is asked to use the NEBULIZER w/ DUONEB Tid regularly, DULERA100-2spBid, and MUCINEX '600mg'$  1-2tabs bid w/ fluids... She will maintain regular follow up w/ her numerous physicians and plan pulm recheck in 6-8 wks. 12/12/15>   CMarybelis approaching her baseline, s/p pneumonia 10/2015;  Asked to continue NEBs Tid-Qid, Dulera Bid, & rescue inhaler as needed;  She will maintain general med follow up w/ DrBouska, Cards w/ DrHarwani, [facility]- DrDickson, and Neuro- DrWillis;  We plan ROV recheck 3 months 06/18/16>  CGracemariehas been thru a Mccullough but improved after  SBRT for RLL squamous cell ca, recent Feraheme for anemia, and Augmentin for bronchitis; she is encouraged to stay [facility] the [facility] & use the NEBS as needed; definitely needs to incr physical activity & get her weight down... we plan rov in [place] pulm check... 11/08/16>   REC to use her NEB (Duoneb) Tid followed by [id number] and start a regular exercise program; continue follow up w/ her PCP-DrBouska & mult specialty physicians...     Plan:     Patient's Medications  New Prescriptions   No medications on file  Previous Medications   ACETAMINOPHEN (TYLENOL) 500 MG TABLET    Take 500 mg by mouth every 6 (six) hours as needed.   ALBUTEROL (VENTOLIN HFA) 108 (90 BASE) MCG/ACT INHALER    Inhale 2 puffs into the lungs every 4 (four) hours as needed for wheezing or shortness of breath (((PLAN B))).   AMLODIPINE-OLMESARTAN (AZOR) 5-40 MG TABLET    Take 1 tablet by mouth daily.   ASPIRIN EC 81 MG TABLET    Take 1 tablet (81 mg total) by mouth daily.   CLONIDINE (CATAPRES) 0.1 MG TABLET    Take 0.05 mg by mouth 2 (two) times daily.    CLOPIDOGREL (PLAVIX) 75 MG TABLET    Take 1 tablet (75 mg total) by mouth daily.   DEXLANSOPRAZOLE (DEXILANT) 60 MG CAPSULE    Take 1 capsule (60 mg total) by mouth daily before breakfast.   DICYCLOMINE (BENTYL) 10 MG CAPSULE    Take 1 capsule (10 mg total) by mouth 4 (four) times daily -  before meals and at bedtime.   FLUTICASONE-SALMETEROL (ADVAIR HFA) 115-21 MCG/ACT INHALER    Inhale 2 puffs into the lungs 2 (two) times daily.   IPRATROPIUM-ALBUTEROL (DUONEB) 0.5-2.5 (3) MG/3ML SOLN    Take 3 mLs by nebulization 4 (four) times daily.   NITROGLYCERIN (NITROSTAT) 0.4 MG SL TABLET    Place 0.4 mg under the tongue every 5 (five) minutes as needed for chest pain.    PREDNISOLONE ACETATE (PRED FORTE) 1 % OPHTHALMIC SUSPENSION    Place 1 drop into the right eye every other day.    ROSUVASTATIN (CRESTOR) 10 MG TABLET    Take 10 mg by mouth at bedtime.   Modified  Medications   No medications on file  Discontinued Medications   AMOXICILLIN (AMOXIL) 875 MG TABLET    Take 875 mg by  mouth 2 (two) times daily. Started 1/29 x 10 days

## 2016-11-08 NOTE — Patient Instructions (Signed)
Today we updated your med list in our EPIC system...    Continue your current medications the same...  We discussed using your NEBULIZER with the DUONEB medication THREE times daily on a regular dosing schedule...  Continue the ADVAIR inhaler- 2 sprays twice daily...  And remember the importance of a regular exercise program!!!  Today we rechecked your CXR & LABS...    We will contact you w/ the results when available...   Call for any questions...  Let's plan a follow up visit in 4-20mo sooner if needed for breathing problems..Marland KitchenMarland Kitchen

## 2016-12-04 ENCOUNTER — Other Ambulatory Visit (HOSPITAL_COMMUNITY): Payer: Self-pay | Admitting: Interventional Radiology

## 2016-12-04 DIAGNOSIS — I671 Cerebral aneurysm, nonruptured: Secondary | ICD-10-CM

## 2016-12-11 ENCOUNTER — Other Ambulatory Visit (HOSPITAL_COMMUNITY): Payer: Self-pay | Admitting: Family Medicine

## 2016-12-11 DIAGNOSIS — G44039 Episodic paroxysmal hemicrania, not intractable: Secondary | ICD-10-CM

## 2016-12-12 ENCOUNTER — Other Ambulatory Visit (HOSPITAL_COMMUNITY): Payer: Self-pay | Admitting: Family Medicine

## 2016-12-14 ENCOUNTER — Ambulatory Visit (HOSPITAL_COMMUNITY)
Admission: RE | Admit: 2016-12-14 | Discharge: 2016-12-14 | Disposition: A | Discharge status: Home / Self Care | Payer: Medicare HMO | Source: Ambulatory Visit | Attending: Interventional Radiology | Admitting: Interventional Radiology

## 2016-12-14 DIAGNOSIS — I671 Cerebral aneurysm, nonruptured: Secondary | ICD-10-CM

## 2016-12-14 HISTORY — PX: IR RADIOLOGIST EVAL & MGMT: IMG5224

## 2016-12-15 ENCOUNTER — Ambulatory Visit (HOSPITAL_COMMUNITY)
Admission: RE | Admit: 2016-12-15 | Discharge: 2016-12-15 | Disposition: A | Discharge status: Home / Self Care | Payer: Medicare HMO | Source: Ambulatory Visit | Attending: Family Medicine | Admitting: Family Medicine

## 2016-12-15 DIAGNOSIS — Z9889 Other specified postprocedural states: Secondary | ICD-10-CM | POA: Insufficient documentation

## 2016-12-15 DIAGNOSIS — G44039 Episodic paroxysmal hemicrania, not intractable: Secondary | ICD-10-CM | POA: Diagnosis not present

## 2016-12-15 LAB — CREATININE, SERUM
Creatinine, Ser: 1.33 mg/dL — ABNORMAL HIGH (ref 0.44–1.00)
GFR, EST AFRICAN AMERICAN: 45 mL/min — AB (ref >60)
GFR, EST NON AFRICAN AMERICAN: 39 mL/min — AB (ref >60)

## 2016-12-15 MED ORDER — GADOBENATE DIMEGLUMINE 529 MG/ML IV SOLN
15.0000 mL | Freq: Once | INTRAVENOUS | Status: AC
  Administered 2016-12-15: 15 mL via INTRAVENOUS

## 2016-12-17 ENCOUNTER — Ambulatory Visit: Payer: Medicare HMO | Admitting: Pulmonary Disease

## 2016-12-17 ENCOUNTER — Encounter (HOSPITAL_COMMUNITY): Payer: Self-pay | Admitting: Interventional Radiology

## 2016-12-25 ENCOUNTER — Telehealth (HOSPITAL_COMMUNITY): Payer: Self-pay

## 2016-12-25 ENCOUNTER — Other Ambulatory Visit (HOSPITAL_COMMUNITY): Payer: Self-pay | Admitting: Interventional Radiology

## 2016-12-25 DIAGNOSIS — I729 Aneurysm of unspecified site: Secondary | ICD-10-CM

## 2016-12-25 NOTE — Telephone Encounter (Signed)
Called to schedule mra. Left message for pt to return call. AW

## 2017-01-01 ENCOUNTER — Other Ambulatory Visit (HOSPITAL_COMMUNITY): Payer: Self-pay | Admitting: Interventional Radiology

## 2017-01-01 ENCOUNTER — Ambulatory Visit (HOSPITAL_COMMUNITY): Admission: RE | Admit: 2017-01-01 | Payer: Medicare HMO | Source: Ambulatory Visit

## 2017-01-01 ENCOUNTER — Ambulatory Visit (HOSPITAL_COMMUNITY)
Admission: RE | Admit: 2017-01-01 | Discharge: 2017-01-01 | Disposition: A | Discharge status: Home / Self Care | Payer: Medicare HMO | Source: Ambulatory Visit | Attending: Interventional Radiology | Admitting: Interventional Radiology

## 2017-01-01 ENCOUNTER — Encounter (HOSPITAL_COMMUNITY): Payer: Self-pay

## 2017-01-01 DIAGNOSIS — I729 Aneurysm of unspecified site: Secondary | ICD-10-CM

## 2017-01-01 DIAGNOSIS — I671 Cerebral aneurysm, nonruptured: Secondary | ICD-10-CM | POA: Diagnosis not present

## 2017-01-01 MED ORDER — GADOBENATE DIMEGLUMINE 529 MG/ML IV SOLN
10.0000 mL | Freq: Once | INTRAVENOUS | Status: AC
  Administered 2017-01-01: 8 mL via INTRAVENOUS

## 2017-01-03 ENCOUNTER — Other Ambulatory Visit: Payer: Self-pay | Admitting: Family Medicine

## 2017-01-03 DIAGNOSIS — Z1231 Encounter for screening mammogram for malignant neoplasm of breast: Secondary | ICD-10-CM

## 2017-01-15 ENCOUNTER — Telehealth (HOSPITAL_COMMUNITY): Payer: Self-pay

## 2017-01-15 NOTE — Telephone Encounter (Signed)
Pt agreed to f/u in 1 yr with mra. AW

## 2017-01-17 ENCOUNTER — Telehealth: Payer: Self-pay | Admitting: *Deleted

## 2017-01-17 NOTE — Telephone Encounter (Signed)
CALLED PATIENT TO INFORM OF STAT LAB ON 01-28-18 @ 9:30 AM AND HER CT ON 01-28-17 - ARRIVAL TIME - 10:15 AM @ [facility], PT. TO BE NPO - 4 HRS. PRIOR TO TEST AND HIS FU WITH ALISON PERKINS ON 01-29-17 @ 10 AM, SPOKE WITH PATIENT AND SHE IS AWARE OF THESE APPTS.

## 2017-01-22 NOTE — Progress Notes (Addendum)
[name] 76 y.o. 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      Weight changes, if any: Wt Readings from Last 3 Encounters:  01/29/17 163 lb 6.4 oz (74.1 kg)  11/08/16 167 lb 8 oz (76 kg)  09/15/16 165 lb (74.8 kg)   Respiratory complaints, if any: SOB,coughing white secretion had recent pneumonia one weeks ago on antibiotic Septra-DS 800-160 mg bid that ends Friday.  She is still weak from the pneumonia Hemoptysis, if any: No Swallowing Problems/Pain/Difficulty swallowing:NONE Appetite : Eating fair. Pain:No When is next chemo scheduled?:N/A Imaging:01-28-17 CT chest w contrast. Lab work from of chart: 09-15-16 ED visit acute hematochezia with lower abdominal pain unknown etiology 08-08-16 Saw Dr. Julien Nordmann I recommended for her to continue on observation with repeat CT scan of the chest in 6 months. BP (!) 130/52   Pulse 70   Temp 97.9 F (36.6 C) (Oral)   Resp 18   Ht 5\' 2"  (1.575 m)   Wt 163 lb 6.4 oz (74.1 kg)   SpO2 98%   BMI 29.89 kg/m

## 2017-01-28 ENCOUNTER — Ambulatory Visit (HOSPITAL_COMMUNITY)
Admission: RE | Admit: 2017-01-28 | Discharge: 2017-01-28 | Disposition: A | Discharge status: Home / Self Care | Payer: Medicare HMO | Source: Ambulatory Visit | Attending: Radiation Oncology | Admitting: Radiation Oncology

## 2017-01-28 ENCOUNTER — Ambulatory Visit
Admission: RE | Admit: 2017-01-28 | Discharge: 2017-01-28 | Disposition: A | Discharge status: Home / Self Care | Payer: Medicare HMO | Source: Ambulatory Visit | Attending: Radiation Oncology | Admitting: Radiation Oncology

## 2017-01-28 DIAGNOSIS — Z87891 Personal history of nicotine dependence: Secondary | ICD-10-CM | POA: Insufficient documentation

## 2017-01-28 DIAGNOSIS — K219 Gastro-esophageal reflux disease without esophagitis: Secondary | ICD-10-CM | POA: Insufficient documentation

## 2017-01-28 DIAGNOSIS — Z8249 Family history of ischemic heart disease and other diseases of the circulatory system: Secondary | ICD-10-CM | POA: Insufficient documentation

## 2017-01-28 DIAGNOSIS — I252 Old myocardial infarction: Secondary | ICD-10-CM | POA: Insufficient documentation

## 2017-01-28 DIAGNOSIS — Z90710 Acquired absence of both cervix and uterus: Secondary | ICD-10-CM | POA: Insufficient documentation

## 2017-01-28 DIAGNOSIS — H409 Unspecified glaucoma: Secondary | ICD-10-CM | POA: Insufficient documentation

## 2017-01-28 DIAGNOSIS — I2510 Atherosclerotic heart disease of native coronary artery without angina pectoris: Secondary | ICD-10-CM | POA: Insufficient documentation

## 2017-01-28 DIAGNOSIS — Z888 Allergy status to other drugs, medicaments and biological substances status: Secondary | ICD-10-CM | POA: Insufficient documentation

## 2017-01-28 DIAGNOSIS — I739 Peripheral vascular disease, unspecified: Secondary | ICD-10-CM | POA: Insufficient documentation

## 2017-01-28 DIAGNOSIS — I700 Atherosclerosis of aorta: Secondary | ICD-10-CM | POA: Insufficient documentation

## 2017-01-28 DIAGNOSIS — C3431 Malignant neoplasm of lower lobe, right bronchus or lung: Secondary | ICD-10-CM

## 2017-01-28 DIAGNOSIS — Z7952 Long term (current) use of systemic steroids: Secondary | ICD-10-CM | POA: Insufficient documentation

## 2017-01-28 DIAGNOSIS — I10 Essential (primary) hypertension: Secondary | ICD-10-CM | POA: Insufficient documentation

## 2017-01-28 DIAGNOSIS — N644 Mastodynia: Secondary | ICD-10-CM | POA: Insufficient documentation

## 2017-01-28 DIAGNOSIS — K449 Diaphragmatic hernia without obstruction or gangrene: Secondary | ICD-10-CM | POA: Insufficient documentation

## 2017-01-28 DIAGNOSIS — R197 Diarrhea, unspecified: Secondary | ICD-10-CM | POA: Insufficient documentation

## 2017-01-28 DIAGNOSIS — E785 Hyperlipidemia, unspecified: Secondary | ICD-10-CM | POA: Insufficient documentation

## 2017-01-28 DIAGNOSIS — D509 Iron deficiency anemia, unspecified: Secondary | ICD-10-CM | POA: Insufficient documentation

## 2017-01-28 DIAGNOSIS — Z832 Family history of diseases of the blood and blood-forming organs and certain disorders involving the immune mechanism: Secondary | ICD-10-CM | POA: Insufficient documentation

## 2017-01-28 DIAGNOSIS — Z79899 Other long term (current) drug therapy: Secondary | ICD-10-CM | POA: Insufficient documentation

## 2017-01-28 DIAGNOSIS — Z801 Family history of malignant neoplasm of trachea, bronchus and lung: Secondary | ICD-10-CM | POA: Insufficient documentation

## 2017-01-28 DIAGNOSIS — Z7982 Long term (current) use of aspirin: Secondary | ICD-10-CM | POA: Insufficient documentation

## 2017-01-28 DIAGNOSIS — J440 Chronic obstructive pulmonary disease with acute lower respiratory infection: Secondary | ICD-10-CM | POA: Insufficient documentation

## 2017-01-28 DIAGNOSIS — M47814 Spondylosis without myelopathy or radiculopathy, thoracic region: Secondary | ICD-10-CM | POA: Insufficient documentation

## 2017-01-28 DIAGNOSIS — Z9049 Acquired absence of other specified parts of digestive tract: Secondary | ICD-10-CM | POA: Insufficient documentation

## 2017-01-28 DIAGNOSIS — Z885 Allergy status to narcotic agent status: Secondary | ICD-10-CM | POA: Insufficient documentation

## 2017-01-28 DIAGNOSIS — J189 Pneumonia, unspecified organism: Secondary | ICD-10-CM | POA: Insufficient documentation

## 2017-01-28 LAB — BASIC METABOLIC PANEL
Anion Gap: 10 mEq/L (ref 3–11)
BUN: 13.9 mg/dL (ref 7.0–26.0)
CHLORIDE: 108 meq/L (ref 98–109)
CO2: 23 meq/L (ref 22–29)
CREATININE: 1.4 mg/dL — AB (ref 0.6–1.1)
Calcium: 10.3 mg/dL (ref 8.4–10.4)
EGFR: 37 mL/min/{1.73_m2} — ABNORMAL LOW (ref >90)
Glucose: 94 mg/dl (ref 70–140)
POTASSIUM: 4.7 meq/L (ref 3.5–5.1)
SODIUM: 141 meq/L (ref 136–145)

## 2017-01-28 MED ORDER — IOPAMIDOL (ISOVUE-300) INJECTION 61%
INTRAVENOUS | Status: AC
  Administered 2017-01-28: 60 mL
  Filled 2017-01-28: qty 75

## 2017-01-29 ENCOUNTER — Ambulatory Visit
Admission: RE | Admit: 2017-01-29 | Discharge: 2017-01-29 | Disposition: A | Discharge status: Home / Self Care | Payer: Medicare HMO | Source: Ambulatory Visit | Attending: Radiation Oncology | Admitting: Radiation Oncology

## 2017-01-29 ENCOUNTER — Encounter: Payer: Self-pay | Admitting: Radiation Oncology

## 2017-01-29 VITALS — BP 130/52 | HR 70 | Temp 97.9°F | Resp 18 | Ht 62.0 in | Wt 163.4 lb

## 2017-01-29 DIAGNOSIS — Z7982 Long term (current) use of aspirin: Secondary | ICD-10-CM | POA: Diagnosis not present

## 2017-01-29 DIAGNOSIS — C3431 Malignant neoplasm of lower lobe, right bronchus or lung: Secondary | ICD-10-CM

## 2017-01-29 DIAGNOSIS — K219 Gastro-esophageal reflux disease without esophagitis: Secondary | ICD-10-CM | POA: Diagnosis not present

## 2017-01-29 DIAGNOSIS — Z888 Allergy status to other drugs, medicaments and biological substances status: Secondary | ICD-10-CM | POA: Diagnosis not present

## 2017-01-29 DIAGNOSIS — I2510 Atherosclerotic heart disease of native coronary artery without angina pectoris: Secondary | ICD-10-CM | POA: Diagnosis not present

## 2017-01-29 DIAGNOSIS — Z79899 Other long term (current) drug therapy: Secondary | ICD-10-CM | POA: Diagnosis not present

## 2017-01-29 DIAGNOSIS — Z801 Family history of malignant neoplasm of trachea, bronchus and lung: Secondary | ICD-10-CM | POA: Diagnosis not present

## 2017-01-29 DIAGNOSIS — Z9049 Acquired absence of other specified parts of digestive tract: Secondary | ICD-10-CM | POA: Diagnosis not present

## 2017-01-29 DIAGNOSIS — R197 Diarrhea, unspecified: Secondary | ICD-10-CM | POA: Diagnosis not present

## 2017-01-29 DIAGNOSIS — Z87891 Personal history of nicotine dependence: Secondary | ICD-10-CM | POA: Diagnosis not present

## 2017-01-29 DIAGNOSIS — Z885 Allergy status to narcotic agent status: Secondary | ICD-10-CM | POA: Diagnosis not present

## 2017-01-29 DIAGNOSIS — Z832 Family history of diseases of the blood and blood-forming organs and certain disorders involving the immune mechanism: Secondary | ICD-10-CM | POA: Diagnosis not present

## 2017-01-29 DIAGNOSIS — J440 Chronic obstructive pulmonary disease with acute lower respiratory infection: Secondary | ICD-10-CM | POA: Diagnosis not present

## 2017-01-29 DIAGNOSIS — E785 Hyperlipidemia, unspecified: Secondary | ICD-10-CM | POA: Diagnosis not present

## 2017-01-29 DIAGNOSIS — I10 Essential (primary) hypertension: Secondary | ICD-10-CM | POA: Diagnosis not present

## 2017-01-29 DIAGNOSIS — D509 Iron deficiency anemia, unspecified: Secondary | ICD-10-CM | POA: Diagnosis not present

## 2017-01-29 DIAGNOSIS — Z90710 Acquired absence of both cervix and uterus: Secondary | ICD-10-CM | POA: Diagnosis not present

## 2017-01-29 DIAGNOSIS — Z8249 Family history of ischemic heart disease and other diseases of the circulatory system: Secondary | ICD-10-CM | POA: Diagnosis not present

## 2017-01-29 DIAGNOSIS — J189 Pneumonia, unspecified organism: Secondary | ICD-10-CM | POA: Diagnosis not present

## 2017-01-29 DIAGNOSIS — I739 Peripheral vascular disease, unspecified: Secondary | ICD-10-CM | POA: Diagnosis not present

## 2017-01-29 DIAGNOSIS — Z7952 Long term (current) use of systemic steroids: Secondary | ICD-10-CM | POA: Diagnosis not present

## 2017-01-29 DIAGNOSIS — H409 Unspecified glaucoma: Secondary | ICD-10-CM | POA: Diagnosis not present

## 2017-01-29 DIAGNOSIS — K449 Diaphragmatic hernia without obstruction or gangrene: Secondary | ICD-10-CM | POA: Diagnosis not present

## 2017-01-29 DIAGNOSIS — I252 Old myocardial infarction: Secondary | ICD-10-CM | POA: Diagnosis not present

## 2017-01-29 NOTE — Progress Notes (Addendum)
Radiation Oncology         [phone] ________________________________  Name: [name] MRN: [id number]  Date: 01/29/2017  DOB: 1940/10/01  Follow Up Note  CC: [name], MD  [name], MD  Diagnosis:   Stage IA, T1aN0M0 NCSLC, squamous cell carcinoma of the right lower lobe  Interval Since Last Radiation:  8 months  05/21/16- 05/28/16 SBRT Treatment: The target was treated to 54 Gy in 3 fractions of 18 Gy  Narrative:  In summary this is a pleasant  76 y.o. female who is a former smoker who presented in the summer of 2017 with nausea and diarrhea. She ultimately had a CXR that revealed a 1 cm nodular density in the left lung base and right mid lung. Work up revealed a 9.4 x 11.2 mm mass in the right lower lobe, and PET scan on 03/14/16 revealed hypermetabolic change in the nodule with an SUV of 3.2. She had a biopsy on 04/11/16 revealing squamous cell carcinoma, and subsequently moved forward with SBRT. She completed this on 05/28/16 and has been followed in surveillance. She had improvement in her post treatment scan in December 2017, and has been treated for left sided pneumonia by her PCP. She has not followed up with Dr. Lenna Gilford in the last few months, but sees him in July 2018. She comes today to review her recent CT of the chest performed yesterday which reveals stability of the right lower lobe lesion that was previously treated, as well as some nodularity of the lingula on the left with tree in bud morphology.            On review of systems, the patient reports that she is doing well overall. She is recovering from recent left sided pneumonia. She denies any chest pain, shortness of breath, fevers, chills, night sweats, unintended weight changes. She reports her cough is less productive than it had been and she continues on Bactrim DS. She has noted soreness along her left breast without palpable mass, bruising, or nipple bleeding/discharge. She denies any bowel or bladder  disturbances, and denies abdominal pain, nausea or vomiting. She denies any new musculoskeletal or joint aches or pains, new skin lesions or concerns. A complete review of systems is obtained and is otherwise negative.  Past Medical History:  Past Medical History:  Diagnosis Date  . 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 Arnold-Chiari malformation ([place]) 1998  . Asthma   . 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  . Carotid artery occlusion   . Complication of anesthesia   . COPD (chronic obstructive pulmonary disease) ([place])   . Coronary artery disease   . 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 Diverticulitis   . Gastroesophageal reflux disease   . Glaucoma   . Hiatal hernia   . Hyperlipidemia   . Hypertension   . Iliac artery aneurysm ([place])   . 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 Myocardial infarction [facility]) 01/01/2000   Cardiac catheterization  . Peripheral vascular disease ([place])   . PONV (postoperative nausea and vomiting)    occassionally  . Reflux     Past Surgical History: Past Surgical History:  Procedure Laterality Date  . ABDOMINAL HYSTERECTOMY    . APPENDECTOMY    . Arnold-chiari malformation repair  1998   Suboccipital craniectomy  . CAROTID ENDARTERECTOMY  03/29/2010   Left  CEA  . CHOLECYSTECTOMY     Gall  Bladder  . COLONOSCOPY WITH PROPOFOL N/A 04/22/2015   Procedure: COLONOSCOPY WITH PROPOFOL;  Surgeon: Carol Ada, MD;  Location: [facility];  Service: Endoscopy;  Laterality: N/A;  . COLONOSCOPY WITH PROPOFOL N/A 05/25/2016   Procedure: COLONOSCOPY WITH PROPOFOL;  Surgeon: Carol Ada, MD;  Location: [facility];  Service: Endoscopy;  Laterality: N/A;  . CORNEAL TRANSPLANT     Right  . CORONARY ARTERY BYPASS GRAFT  01/01/2000  . ESOPHAGOGASTRODUODENOSCOPY N/A 05/25/2016   Procedure: ESOPHAGOGASTRODUODENOSCOPY (EGD);  Surgeon: Carol Ada, MD;  Location: [facility];  Service: Endoscopy;   Laterality: N/A;  . EYE SURGERY     Laser surgery for retinal hemorrhage  . IR RADIOLOGIST EVAL & MGMT  12/14/2016  . LEFT HEART CATHETERIZATION WITH CORONARY ANGIOGRAM N/A 08/03/2014   Procedure: LEFT HEART CATHETERIZATION WITH CORONARY ANGIOGRAM;  Surgeon: Birdie Riddle, MD;  Location: [facility];  Service: Cardiovascular;  Laterality: N/A;  . Post Coronary Artery  BPG  01/05/2000   Right jugular sheath removed  . PR VEIN BYPASS GRAFT,AORTO-FEM-POP    . ROTATOR CUFF REPAIR     Right    Social History:  Social History   Social History  . Marital status: Widowed    Spouse name: N/A  . Number of children: 4  . Years of education: 7TH   Occupational History  . Not on file.   Social History Main Topics  . Smoking status: Former Smoker    Packs/day: 1.50    Years: 30.00    Types: Cigarettes    Quit date: 08/13/2000  . Smokeless tobacco: Never Used  . Alcohol use No  . Drug use: No  . Sexual activity: No   Other Topics Concern  . Not on file   Social History Narrative   Lives at home w/ her son   Right-handed   Drinks coffee and [employer] daily     The patient lives in [place], [place].  Family History: Family History  Problem Relation Age of Onset  . Heart disease Mother        Heart Disease before age 75  . Hypertension Mother   . Hyperlipidemia Mother   . Heart attack Mother   . Clotting disorder Mother   . Heart disease Father        Heart Disease before age 44  . Heart attack Father   . Hyperlipidemia Father   . Hypertension Father   . Heart disease Brother        Heart Disease before age 7  . Hyperlipidemia Brother   . Hypertension Brother   . Clotting disorder Brother   . AAA (abdominal aortic aneurysm) Brother   . Cerebral aneurysm Sister   . Hypertension Sister   . AAA (abdominal aortic aneurysm) Sister   . Asthma Sister   . Cerebral aneurysm Brother   . Cancer Brother        Lung  . Hypertension Brother   . Heart attack Brother   . Heart disease  Brother        Aneurysm of Brain  . Hypertension Brother   . Heart disease Brother   . Heart disease Brother   . Stroke Son        Aneurysm of Stomach  . AAA (abdominal aortic aneurysm) Son   . Cancer Maternal Uncle        great uncle/cancer/type unknown     ALLERGIES:  is allergic to hydromorphone; levaquin [levofloxacin]; codeine; doxycycline; oxycodone-acetaminophen; risedronate; and avelox [moxifloxacin hcl in  nacl].  Meds: Current Outpatient Prescriptions  Medication Sig Dispense Refill  . acetaminophen (TYLENOL) 500 MG tablet Take 500 mg by mouth every 6 (six) hours as needed.    Marland Kitchen amLODipine-olmesartan (AZOR) 5-40 MG tablet Take 1 tablet by mouth daily.    . cloNIDine (CATAPRES) 0.1 MG tablet Take 0.05 mg by mouth 2 (two) times daily.     . clopidogrel (PLAVIX) 75 MG tablet Take 1 tablet (75 mg total) by mouth daily. 90 tablet 3  . dexlansoprazole (DEXILANT) 60 MG capsule Take 1 capsule (60 mg total) by mouth daily before breakfast. 30 capsule 11  . rosuvastatin (CRESTOR) 10 MG tablet Take 10 mg by mouth daily.    Marland Kitchen sulfamethoxazole-trimethoprim (BACTRIM DS,SEPTRA DS) 800-160 MG tablet Take 1 tablet by mouth 2 (two) times daily.    Marland Kitchen albuterol (VENTOLIN HFA) 108 (90 BASE) MCG/ACT inhaler Inhale 2 puffs into the lungs every 4 (four) hours as needed for wheezing or shortness of breath (((PLAN B))). (Patient not taking: Reported on 01/29/2017) 1 Inhaler 11  . aspirin EC 81 MG tablet Take 1 tablet (81 mg total) by mouth daily. (Patient not taking: Reported on 01/29/2017) 30 tablet 0  . dicyclomine (BENTYL) 10 MG capsule Take 1 capsule (10 mg total) by mouth 4 (four) times daily -  before meals and at bedtime. (Patient not taking: Reported on 01/29/2017) 120 capsule 1  . fluticasone-salmeterol (ADVAIR HFA) 115-21 MCG/ACT inhaler Inhale 2 puffs into the lungs 2 (two) times daily. 1 Inhaler 12  . ipratropium-albuterol (DUONEB) 0.5-2.5 (3) MG/3ML SOLN Take 3 mLs by nebulization 4 (four)  times daily. 360 mL 0  . nitroGLYCERIN (NITROSTAT) 0.4 MG SL tablet Place 0.4 mg under the tongue every 5 (five) minutes as needed for chest pain.     . prednisoLONE acetate (PRED FORTE) 1 % ophthalmic suspension Place 1 drop into the right eye every other day.     . rosuvastatin (CRESTOR) 10 MG tablet Take 10 mg by mouth at bedtime.      No current facility-administered medications for this encounter.     Physical Findings:  height is 5\' 2"  (1.575 m) and weight is 163 lb 6.4 oz (74.1 kg). Her oral temperature is 97.9 F (36.6 C). Her blood pressure is 130/52 (abnormal) and her pulse is 70. Her respiration is 18 and oxygen saturation is 98%.  In general this is a well appearing caucasian female in no acute distress. She is alert and oriented x4 and appropriate throughout the examination. HEENT reveals that the patient is normocephalic, atraumatic. EOMs are intact. PERRLA. Skin is intact without any evidence of gross lesions. Cardiovascular exam reveals a regular rate and rhythm, no clicks rubs or murmurs are auscultated. Chest is clear to auscultation bilaterally. Lymphatic assessment is performed and does not reveal any adenopathy in the cervical, supraclavicular, axillary, or inguinal chains. Abdomen has active bowel sounds in all quadrants and is intact.  Lower extremities are negative for pretibial pitting edema, deep calf tenderness, cyanosis or clubbing.   Lab Findings: Lab Results  Component Value Date   WBC 11.8 (H) 11/08/2016   HGB 12.2 11/08/2016   HCT 36.2 11/08/2016   MCV 90.1 11/08/2016   PLT 318.0 11/08/2016     Radiographic Findings: Ct Chest W Contrast  Result Date: 01/28/2017 CLINICAL DATA:  Right lower lobe lung cancer.  Restaging. EXAM: CT CHEST WITH CONTRAST TECHNIQUE: Multidetector CT imaging of the chest was performed during intravenous contrast administration. CONTRAST:  <See Chart> ISOVUE-300  IOPAMIDOL (ISOVUE-300) INJECTION 61% COMPARISON:  01/21/2017 chest  radiograph.  CT of 07/14/2016. FINDINGS: Cardiovascular: Bovine arch. Aortic and branch vessel atherosclerosis. Also plaque in the descending thoracic aorta. Normal heart size, without pericardial effusion. Native coronary artery atherosclerosis. No central pulmonary embolism, on this non-dedicated study. Mediastinum/Nodes: No supraclavicular adenopathy. No mediastinal or hilar adenopathy. Lungs/Pleura: No pleural fluid. No change in partially calcified nodularity along the right minor fissure, including on image 61/series 7. The right lower lobe pulmonary nodule measures 8 x 5 mm on image 76/series 7. Felt to be similar to 7 x 6 mm on the prior exam (when remeasured). Bilateral patchy airspace opacities with a nodular morphology. These are most confluent within the left lower lobe, but also identified within the lingula and right lower lobe. Some areas of lingular nodularity have a "Tree-in-bud" morphology. Given this factor, no evidence of pulmonary metastasis. Upper Abdomen: Normal imaged portions of the liver, spleen, stomach, pancreas, adrenal glands, kidneys. Advanced abdominal aortic and branch vessel atherosclerosis. Musculoskeletal: Mild thoracic spondylosis. IMPRESSION: 1. Similar size of a right lower lobe pulmonary nodule. 2. Bilateral, left lower lobe predominant areas of airspace and nodular opacity. Especially given the clinical history of fever and cough on 01/21/2017, most consistent with subacute infection. This decreases sensitivity for pulmonary metastasis. 3. No thoracic adenopathy. 4.  Aortic Atherosclerosis (ICD10-I70.0). Electronically Signed   By: Abigail Miyamoto M.D.   On: 01/28/2017 13:48   Mr [name] Head Wo Contrast  Result Date: 01/01/2017 CLINICAL DATA:  Follow-up aneurysm EXAM: MRA HEAD WITHOUT CONTRAST TECHNIQUE: Angiographic images of the Circle of Willis were obtained using MRA technique without intravenous contrast. COMPARISON:  MRA 02/09/2015, 12/09/2014 FINDINGS: Left vertebral  dominant. Both vertebral arteries contribute to the basilar. Right PICA patent. Left PICA not visualized. Basilar widely patent. Superior cerebellar and posterior cerebral arteries patent without significant stenosis. Internal carotid artery patent bilaterally. Slight outpouching of the left cavernous carotid unchanged. 3 anterior cerebral arteries are noted and unchanged, normal variant. 2 mm outpouching right MCA bifurcation unchanged compatible with small aneurysm. No significant intracranial stenosis IMPRESSION: 2 mm outpouching left cavernous carotid unchanged. 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 Electronically Signed   By: Franchot Gallo M.D.   On: 01/01/2017 20:32   Mr [name] Neck W Wo Contrast  Result Date: 01/01/2017 CLINICAL DATA:  Follow-up aneurysm EXAM: MRA HEAD WITHOUT CONTRAST TECHNIQUE: Angiographic images of the Circle of Willis were obtained using MRA technique without intravenous contrast. COMPARISON:  MRA 02/09/2015, 12/09/2014 FINDINGS: Left vertebral dominant. Both vertebral arteries contribute to the basilar. Right PICA patent. Left PICA not visualized. Basilar widely patent. Superior cerebellar and posterior cerebral arteries patent without significant stenosis. Internal carotid artery patent bilaterally. Slight outpouching of the left cavernous carotid unchanged. 3 anterior cerebral arteries are noted and unchanged, normal variant. 2 mm outpouching right MCA bifurcation unchanged compatible with small aneurysm. No significant intracranial stenosis IMPRESSION: 2 mm outpouching left cavernous carotid unchanged. 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 Electronically Signed   By: Franchot Gallo M.D.   On: 01/01/2017 20:32    Impression/Plan: 1. Stage IA, T1aN0M0 NCSLC, squamous cell carcinoma of the right lower lobe. The patient appears to be doing well since completing radiotherapy. Given her recent infections and description of the left lung, I would recommend a repeat  CT in 3 month's time. I will follow up with these results by phone, and we will coordinate her next interval scan at either 3-6 months depending on the findings. 2.  Iron deficiency anemia. The patient will continue to follow up with Dr. Julien Nordmann for management of this this week. 3.  Left breast pain. I offered to examine her breast today but she declines and wishes to pursue mammography in the next few weeks. 4. COPD and recent pneumonia. I encouraged her to contact Dr. Jeannine Kitten office to make him aware of her recent illness and to make sure she follows up with him in July. She states agreement and understanding.  In a visit lasting 25 minutes, greater than 50% of the time was spent face to face discussing the need for close follow up with pulmonology, and coordinating the patient's care.    Carola Rhine, PAC

## 2017-02-06 ENCOUNTER — Other Ambulatory Visit: Payer: Self-pay | Admitting: Internal Medicine

## 2017-02-06 ENCOUNTER — Encounter: Payer: Self-pay | Admitting: Internal Medicine

## 2017-02-06 ENCOUNTER — Telehealth: Payer: Self-pay | Admitting: Internal Medicine

## 2017-02-06 ENCOUNTER — Ambulatory Visit (HOSPITAL_BASED_OUTPATIENT_CLINIC_OR_DEPARTMENT_OTHER): Payer: Medicare HMO

## 2017-02-06 ENCOUNTER — Ambulatory Visit (HOSPITAL_BASED_OUTPATIENT_CLINIC_OR_DEPARTMENT_OTHER): Payer: Medicare HMO | Admitting: Internal Medicine

## 2017-02-06 ENCOUNTER — Other Ambulatory Visit (HOSPITAL_BASED_OUTPATIENT_CLINIC_OR_DEPARTMENT_OTHER): Payer: Medicare HMO

## 2017-02-06 VITALS — BP 139/54 | HR 62 | Temp 97.6°F | Resp 18 | Ht 62.0 in | Wt 160.7 lb

## 2017-02-06 DIAGNOSIS — C3431 Malignant neoplasm of lower lobe, right bronchus or lung: Secondary | ICD-10-CM

## 2017-02-06 DIAGNOSIS — E875 Hyperkalemia: Secondary | ICD-10-CM

## 2017-02-06 DIAGNOSIS — D508 Other iron deficiency anemias: Secondary | ICD-10-CM

## 2017-02-06 DIAGNOSIS — D649 Anemia, unspecified: Secondary | ICD-10-CM

## 2017-02-06 LAB — BASIC METABOLIC PANEL
Anion Gap: 10 mEq/L (ref 3–11)
BUN: 23.2 mg/dL (ref 7.0–26.0)
CALCIUM: 10 mg/dL (ref 8.4–10.4)
CHLORIDE: 106 meq/L (ref 98–109)
CO2: 21 meq/L — AB (ref 22–29)
CREATININE: 1.3 mg/dL — AB (ref 0.6–1.1)
EGFR: 41 mL/min/{1.73_m2} — ABNORMAL LOW (ref >90)
Glucose: 110 mg/dl (ref 70–140)
Potassium: 5.5 mEq/L — ABNORMAL HIGH (ref 3.5–5.1)
SODIUM: 137 meq/L (ref 136–145)

## 2017-02-06 LAB — COMPREHENSIVE METABOLIC PANEL
ALBUMIN: 3.5 g/dL (ref 3.5–5.0)
ALK PHOS: 73 U/L (ref 40–150)
ALT: 12 U/L (ref 0–55)
AST: 17 U/L (ref 5–34)
Anion Gap: 12 mEq/L — ABNORMAL HIGH (ref 3–11)
BUN: 22.7 mg/dL (ref 7.0–26.0)
CALCIUM: 10.3 mg/dL (ref 8.4–10.4)
CHLORIDE: 107 meq/L (ref 98–109)
CO2: 23 mEq/L (ref 22–29)
CREATININE: 1.3 mg/dL — AB (ref 0.6–1.1)
EGFR: 40 mL/min/{1.73_m2} — ABNORMAL LOW (ref >90)
GLUCOSE: 107 mg/dL (ref 70–140)
POTASSIUM: 5.8 meq/L — AB (ref 3.5–5.1)
SODIUM: 142 meq/L (ref 136–145)
Total Bilirubin: 0.3 mg/dL (ref 0.20–1.20)
Total Protein: 7.8 g/dL (ref 6.4–8.3)

## 2017-02-06 LAB — CBC WITH DIFFERENTIAL/PLATELET
BASO%: 0.7 % (ref 0.0–2.0)
Basophils Absolute: 0.1 10*3/uL (ref 0.0–0.1)
EOS%: 3.7 % (ref 0.0–7.0)
Eosinophils Absolute: 0.3 10*3/uL (ref 0.0–0.5)
HEMATOCRIT: 33 % — AB (ref 34.8–46.6)
HEMOGLOBIN: 10.8 g/dL — AB (ref 11.6–15.9)
LYMPH#: 2.7 10*3/uL (ref 0.9–3.3)
LYMPH%: 35.5 % (ref 14.0–49.7)
MCH: 30.3 pg (ref 25.1–34.0)
MCHC: 32.7 g/dL (ref 31.5–36.0)
MCV: 92.7 fL (ref 79.5–101.0)
MONO#: 0.6 10*3/uL (ref 0.1–0.9)
MONO%: 7.9 % (ref 0.0–14.0)
NEUT#: 4 10*3/uL (ref 1.5–6.5)
NEUT%: 52.2 % (ref 38.4–76.8)
Platelets: 335 10*3/uL (ref 145–400)
RBC: 3.56 10*6/uL — ABNORMAL LOW (ref 3.70–5.45)
RDW: 14.1 % (ref 11.2–14.5)
WBC: 7.6 10*3/uL (ref 3.9–10.3)

## 2017-02-06 LAB — FERRITIN: Ferritin: 156 ng/ml (ref 9–269)

## 2017-02-06 LAB — IRON AND TIBC
%SAT: 22 % (ref 21–57)
Iron: 69 ug/dL (ref 41–142)
TIBC: 321 ug/dL (ref 236–444)
UIBC: 252 ug/dL (ref 120–384)

## 2017-02-06 MED ORDER — SODIUM POLYSTYRENE SULFONATE 15 GM/60ML PO SUSP: 15.0000 g | Freq: Once | ORAL | 0 refills | Status: AC

## 2017-02-06 NOTE — Progress Notes (Signed)
[facility] [phone]   [phone]  OFFICE PROGRESS NOTE  [name], MD [name] [address]  DIAGNOSIS:  1) stage IA non-small cell lung cancer, squamous cell carcinoma presented with right lower lobe pulmonary nodule. 2) persistent anemia questionable for anemia of chronic disease plus/minus iron deficiency.  PRIOR THERAPY:  1) Feraheme infusion last dose was given 06/20/2016. 2) stereotactic radiotherapy to the right lower lobe lung nodule under the care of Dr. Tammi Klippel.  CURRENT THERAPY: Observation.  INTERVAL HISTORY: [name] 76 y.o. female returns to the clinic today for follow-up visit accompanied by her daughter. The patient is feeling fine today with no specific complaints. She denied having any chest pain but has shortness breath and mild cough. She denied having any fever or chills. She was treated recently for questionable pneumonia. She denied having any weight loss or night sweats. She has no nausea, vomiting, diarrhea or constipation. She has no fever or chills. The patient had repeat CT scan of the chest performed recently and she is here for evaluation and discussion of her scan results.  MEDICAL HISTORY: Past Medical History:  Diagnosis Date  . 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 Asthma   . 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  . Carotid artery occlusion   . Complication of anesthesia   . COPD (chronic obstructive pulmonary disease) ([place])   . Coronary artery disease   . 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 Diverticulitis   . Gastroesophageal reflux disease   . Glaucoma   . Hiatal hernia   . Hyperlipidemia   . Hypertension   . Iliac artery aneurysm ([place])   . 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 Myocardial infarction [facility]) 01/01/2000   Cardiac catheterization  . Peripheral  vascular disease ([place])   . PONV (postoperative nausea and vomiting)    occassionally  . Reflux     ALLERGIES:  is allergic to hydromorphone; levaquin [levofloxacin]; codeine; doxycycline; oxycodone-acetaminophen; risedronate; and avelox [moxifloxacin hcl in nacl].  MEDICATIONS:  Current Outpatient Prescriptions  Medication Sig Dispense Refill  . acetaminophen (TYLENOL) 500 MG tablet Take 500 mg by mouth every 6 (six) hours as needed.    Marland Kitchen amLODipine-olmesartan (AZOR) 5-40 MG tablet Take 1 tablet by mouth daily.    . cloNIDine (CATAPRES) 0.1 MG tablet Take 0.05 mg by mouth 2 (two) times daily.     . clopidogrel (PLAVIX) 75 MG tablet Take 1 tablet (75 mg total) by mouth daily. 90 tablet 3  . dexlansoprazole (DEXILANT) 60 MG capsule Take 1 capsule (60 mg total) by mouth daily before breakfast. 30 capsule 11  . fluticasone-salmeterol (ADVAIR HFA) 115-21 MCG/ACT inhaler Inhale 2 puffs into the lungs 2 (two) times daily. 1 Inhaler 12  . prednisoLONE acetate (PRED FORTE) 1 % ophthalmic suspension Place 1 drop into the right eye every other day.     . rosuvastatin (CRESTOR) 10 MG tablet Take 10 mg by mouth at bedtime.     Marland Kitchen albuterol (VENTOLIN HFA) 108 (90 BASE) MCG/ACT inhaler Inhale 2 puffs into the lungs every 4 (four) hours as needed for wheezing or shortness of breath (((PLAN B))). (Patient not taking: Reported on 01/29/2017) 1 Inhaler 11  . aspirin EC 81 MG tablet Take 1 tablet (81 mg total) by mouth daily. (Patient not taking: Reported on 01/29/2017)  30 tablet 0  . dicyclomine (BENTYL) 10 MG capsule Take 1 capsule (10 mg total) by mouth 4 (four) times daily -  before meals and at bedtime. (Patient not taking: Reported on 01/29/2017) 120 capsule 1  . ipratropium-albuterol (DUONEB) 0.5-2.5 (3) MG/3ML SOLN Take 3 mLs by nebulization 4 (four) times daily. (Patient not taking: Reported on 02/06/2017) 360 mL 0  . nitroGLYCERIN (NITROSTAT) 0.4 MG SL tablet Place 0.4 mg under the tongue every 5 (five)  minutes as needed for chest pain.     Marland Kitchen sulfamethoxazole-trimethoprim (BACTRIM DS,SEPTRA DS) 800-160 MG tablet Take 1 tablet by mouth 2 (two) times daily.     No current facility-administered medications for this visit.     SURGICAL HISTORY:  Past Surgical History:  Procedure Laterality Date  . ABDOMINAL HYSTERECTOMY    . APPENDECTOMY    . Arnold-chiari malformation repair  1998   Suboccipital craniectomy  . CAROTID ENDARTERECTOMY  03/29/2010   Left  CEA  . CHOLECYSTECTOMY     Gall Bladder  . COLONOSCOPY WITH PROPOFOL N/A 04/22/2015   Procedure: COLONOSCOPY WITH PROPOFOL;  Surgeon: Carol Ada, MD;  Location: [facility] END[facility];  Service: Endoscopy;  Laterality: N/A;  . COLONOSCOPY WITH PROPOFOL N/A 05/25/2016   Procedure: COLONOSCOPY WITH PROPOFOL;  Surgeon: Carol Ada, MD;  Location: [facility] END[facility];  Service: Endoscopy;  Laterality: N/A;  . CORNEAL TRANSPLANT     Right  . CORONARY ARTERY BYPASS GRAFT  01/01/2000  . ESOPHAGOGASTRODUODENOSCOPY N/A 05/25/2016   Procedure: ESOPHAGOGASTRODUODENOSCOPY (EGD);  Surgeon: Carol Ada, MD;  Location: [facility];  Service: Endoscopy;  Laterality: N/A;  . EYE SURGERY     Laser surgery for retinal hemorrhage  . IR RADIOLOGIST EVAL & MGMT  12/14/2016  . LEFT HEART CATHETERIZATION WITH CORONARY ANGIOGRAM N/A 08/03/2014   Procedure: LEFT HEART CATHETERIZATION WITH CORONARY ANGIOGRAM;  Surgeon: Birdie Riddle, MD;  Location: [facility];  Service: Cardiovascular;  Laterality: N/A;  . Post Coronary Artery  BPG  01/05/2000   Right jugular sheath removed  . PR VEIN BYPASS GRAFT,AORTO-FEM-POP    . ROTATOR CUFF REPAIR     Right    REVIEW OF SYSTEMS:  A comprehensive review of systems was negative except for: Respiratory: positive for cough and dyspnea on exertion   PHYSICAL EXAMINATION: General appearance: alert, cooperative and no distress Head: Normocephalic, without obvious abnormality, atraumatic Neck: no adenopathy, no JVD, supple, symmetrical,  trachea midline and thyroid not enlarged, symmetric, no tenderness/mass/nodules Lymph nodes: Cervical, supraclavicular, and axillary nodes normal. Resp: wheezes bilaterally Back: symmetric, no curvature. ROM normal. No CVA tenderness. Cardio: regular rate and rhythm, S1, S2 normal, no murmur, click, rub or gallop GI: soft, non-tender; bowel sounds normal; no masses,  no organomegaly Extremities: extremities normal, atraumatic, no cyanosis or edema  ECOG PERFORMANCE STATUS: 1 - Symptomatic but completely ambulatory  Blood pressure (!) 139/54, pulse 62, temperature 97.6 F (36.4 C), temperature source Oral, resp. rate 18, height 5\' 2"  (1.575 m), weight 160 lb 11.2 oz (72.9 kg), SpO2 100 %.  LABORATORY DATA: Lab Results  Component Value Date   WBC 7.6 02/06/2017   HGB 10.8 (L) 02/06/2017   HCT 33.0 (L) 02/06/2017   MCV 92.7 02/06/2017   PLT 335 02/06/2017      Chemistry      Component Value Date/Time   NA 142 02/06/2017 1010   K 5.8 (H) 02/06/2017 1010   CL 111 09/17/2016 0420   CO2 23 02/06/2017 1010   BUN 22.7 02/06/2017 1010  CREATININE 1.3 (H) 02/06/2017 1010      Component Value Date/Time   CALCIUM 10.3 02/06/2017 1010   ALKPHOS 73 02/06/2017 1010   AST 17 02/06/2017 1010   ALT 12 02/06/2017 1010   BILITOT 0.30 02/06/2017 1010       RADIOGRAPHIC STUDIES: Ct Chest W Contrast  Result Date: 01/28/2017 CLINICAL DATA:  Right lower lobe lung cancer.  Restaging. EXAM: CT CHEST WITH CONTRAST TECHNIQUE: Multidetector CT imaging of the chest was performed during intravenous contrast administration. CONTRAST:  <See Chart> ISOVUE-300 IOPAMIDOL (ISOVUE-300) INJECTION 61% COMPARISON:  01/21/2017 chest radiograph.  CT of 07/14/2016. FINDINGS: Cardiovascular: Bovine arch. Aortic and branch vessel atherosclerosis. Also plaque in the descending thoracic aorta. Normal heart size, without pericardial effusion. Native coronary artery atherosclerosis. No central pulmonary embolism, on this  non-dedicated study. Mediastinum/Nodes: No supraclavicular adenopathy. No mediastinal or hilar adenopathy. Lungs/Pleura: No pleural fluid. No change in partially calcified nodularity along the right minor fissure, including on image 61/series 7. The right lower lobe pulmonary nodule measures 8 x 5 mm on image 76/series 7. Felt to be similar to 7 x 6 mm on the prior exam (when remeasured). Bilateral patchy airspace opacities with a nodular morphology. These are most confluent within the left lower lobe, but also identified within the lingula and right lower lobe. Some areas of lingular nodularity have a "Tree-in-bud" morphology. Given this factor, no evidence of pulmonary metastasis. Upper Abdomen: Normal imaged portions of the liver, spleen, stomach, pancreas, adrenal glands, kidneys. Advanced abdominal aortic and branch vessel atherosclerosis. Musculoskeletal: Mild thoracic spondylosis. IMPRESSION: 1. Similar size of a right lower lobe pulmonary nodule. 2. Bilateral, left lower lobe predominant areas of airspace and nodular opacity. Especially given the clinical history of fever and cough on 01/21/2017, most consistent with subacute infection. This decreases sensitivity for pulmonary metastasis. 3. No thoracic adenopathy. 4.  Aortic Atherosclerosis (ICD10-I70.0). Electronically Signed   By: Abigail Miyamoto M.D.   On: 01/28/2017 13:48    ASSESSMENT AND PLAN:  This is a very pleasant 76 years old white female with a stage IA non-small cell lung cancer, squamous cell carcinoma presented with right lower lobe pulmonary nodule is status post stereotactic radiotherapy. Her recent CT scan showed no clear evidence for disease recurrence but the patient continues to have a disease suspicious for inflammatory process and she was treated recently for pneumonia. I discussed the scan results with the patient and recommended for her to continue on observation with repeat CT scan of the chest in 6 months. She was advised to  call immediately if she has any concerning symptoms in the interval. All questions were answered. The patient knows to call the clinic with any problems, questions or concerns. We can certainly see the patient much sooner if necessary. I spent 10 minutes counseling the patient face to face. The total time spent in the appointment was 15 minutes.  Disclaimer: This note was dictated with voice recognition software. Similar sounding words can inadvertently be transcribed and may not be corrected upon review.

## 2017-02-06 NOTE — Telephone Encounter (Signed)
Lab added for today, per 02/06/17 los. Patient was given a copy of the AVS report and appointment schedule, per 02/06/17 los.

## 2017-02-07 ENCOUNTER — Other Ambulatory Visit: Payer: Self-pay | Admitting: Medical Oncology

## 2017-02-07 ENCOUNTER — Telehealth: Payer: Self-pay | Admitting: Medical Oncology

## 2017-02-07 DIAGNOSIS — E875 Hyperkalemia: Secondary | ICD-10-CM

## 2017-02-07 MED ORDER — SODIUM POLYSTYRENE SULFONATE 15 GM/60ML PO SUSP: 15.0000 g | Freq: Once | ORAL | 0 refills | Status: AC

## 2017-02-07 NOTE — Telephone Encounter (Signed)
I told pt to take kayexalate  for her elevated potassium-rx called to pharmacy.

## 2017-02-14 ENCOUNTER — Ambulatory Visit
Admission: RE | Admit: 2017-02-14 | Discharge: 2017-02-14 | Disposition: A | Discharge status: Home / Self Care | Payer: Medicare HMO | Source: Ambulatory Visit | Attending: Family Medicine | Admitting: Family Medicine

## 2017-02-14 DIAGNOSIS — Z1231 Encounter for screening mammogram for malignant neoplasm of breast: Secondary | ICD-10-CM

## 2017-02-25 ENCOUNTER — Institutional Professional Consult (permissible substitution): Payer: Self-pay | Admitting: Neurology

## 2017-03-11 ENCOUNTER — Ambulatory Visit: Payer: Medicare HMO | Admitting: Pulmonary Disease

## 2017-03-14 ENCOUNTER — Encounter: Payer: Self-pay | Admitting: Pulmonary Disease

## 2017-03-14 ENCOUNTER — Ambulatory Visit (INDEPENDENT_AMBULATORY_CARE_PROVIDER_SITE_OTHER): Payer: Medicare HMO | Admitting: Pulmonary Disease

## 2017-03-14 VITALS — BP 124/60 | HR 64 | Temp 97.6°F | Ht 62.0 in | Wt 162.5 lb

## 2017-03-14 DIAGNOSIS — I2510 Atherosclerotic heart disease of native coronary artery without angina pectoris: Secondary | ICD-10-CM | POA: Diagnosis not present

## 2017-03-14 DIAGNOSIS — I739 Peripheral vascular disease, unspecified: Secondary | ICD-10-CM

## 2017-03-14 DIAGNOSIS — D500 Iron deficiency anemia secondary to blood loss (chronic): Secondary | ICD-10-CM | POA: Diagnosis not present

## 2017-03-14 DIAGNOSIS — I6523 Occlusion and stenosis of bilateral carotid arteries: Secondary | ICD-10-CM

## 2017-03-14 DIAGNOSIS — I10 Essential (primary) hypertension: Secondary | ICD-10-CM | POA: Diagnosis not present

## 2017-03-14 DIAGNOSIS — C3431 Malignant neoplasm of lower lobe, right bronchus or lung: Secondary | ICD-10-CM | POA: Diagnosis not present

## 2017-03-14 DIAGNOSIS — R5381 Other malaise: Secondary | ICD-10-CM | POA: Diagnosis not present

## 2017-03-14 DIAGNOSIS — J449 Chronic obstructive pulmonary disease, unspecified: Secondary | ICD-10-CM

## 2017-03-14 DIAGNOSIS — Z951 Presence of aortocoronary bypass graft: Secondary | ICD-10-CM

## 2017-03-14 DIAGNOSIS — R0602 Shortness of breath: Secondary | ICD-10-CM

## 2017-03-14 MED ORDER — UMECLIDINIUM-VILANTEROL 62.5-25 MCG/INH IN AEPB: 1.0000 | INHALATION_SPRAY | Freq: Every day | RESPIRATORY_TRACT | 6 refills | Status: DC

## 2017-03-14 MED ORDER — UMECLIDINIUM-VILANTEROL 62.5-25 MCG/INH IN AEPB: 1.0000 | INHALATION_SPRAY | Freq: Every day | RESPIRATORY_TRACT | 0 refills | Status: DC

## 2017-03-14 NOTE — Progress Notes (Signed)
Subjective:     Patient ID: [name], female   DOB: 1940-10-17, 76 y.o.   MRN: [id number]  HPI  ~  October 28, 2015:  Initial pulmonary consult by SN>  PCP is DrBouska    58 y/o WF, mother of [employer], w/ hx COPD, HBP, CAD-s/p 3 vessel CABG, severe ASPVD, HL, HH/GERD, Divertics, etc...    She was prev evaluated by DrWert in 2014 & DrGonzalez before that>  She had mult medication intolerances and seemed to do better w/ NEBS; also had hx chr sinusitis & GERD...     Daughter called w/ request for a post-hosp f/u visit>  She was [facility] 3/11 - 10/24/15 by [facility] w/ CAP, COPD exacerbation;  She presented w/ cough, small amt yellow sput, temp to 101, and incr SOB;  ER eval revealed T101.3, & exam w/ decr BS +wheezes and basilar rhonchi;  CXR showed patchy RLL airsp dis, Flu panel neg, unable to get sput for culture, Labs- Cr=1.27, BS~140-190, Hg~10-11 w/ MCV=83;  She was treated w/ O2, IVF, Duoneb, Levaquin & Pred;  Disch on Duoneb via nebulizer Qid, Dulera100-2spBid, Doxy100Bid, Pred taper...     Currently she feels back to baseline w/ sl cough, white sput & no discoloration or blood, +DOE w/ exertion and some ADLs, no CP, palpit, dizzy, edema;  On NEB w/ DUONEB Qid, DULERA100-2spBid, Pred10mg  tapering sched from recent [place] as needed, Zyrtek prn...  Smoking Hx>  Ex-smoker- started age 76, smoked for ~40 yrs up to 1+ppd, and quit in 2002 when she had CABG; total = 45+pack-year smoking history...   Pulmonary Hx>  Hx "asthma", COPD, ex-smoker, hx recurrent bronchits, hx pneumonia 3/17  Medical Hx>  HBP, CAD- s/p 3 vessel CABG in 2002, severe ASPVD w/ Ao-Fem-Pop & left CAE 2011, 58mm right MCA aneurysm, HH/GERD/ Divertics, colon polyps, HAs (s/p craniotomy for Arnold-chiari malformation)  Family Hx>  One sis w/ asthma, otherw all atherosclerosis related...  Occup Hx>  No known asbestos or toxic exposures  Current Meds>  ASA/Plavix, AZOR 5/40, Catapress0.1-1/2Bid, Crestor10, Dexilant60,  (not on DM meds- "I'm pre-diabetic")... EXAM shows Afeb, VSS, O2sat=98% on RA; Wt=174#, 5'2"Tall, BMI=31;  HEENT- neg, mallampati1, bilat CBruits & L-CAE scar;  Chest- decrBS, clear w/o w/r/r;  Heart- CABG scar, RR Gr1-2/6 SEM w/o r/g;  Abd- +epig bruit, w/o masses;  Ext- w/o c/ce;  Neuro- w/o focal deficits...   PF reported from 2013>  FEV1=1.30 (58%), %1sec=65, no change post bronchdil, DLCO=74%  CT Angio Chest 08/02/14 showed NEG for PE, extensive CAD- s/p CABG, subsolid nodules in RML/ RLL pleural thickening & bibasilar atx, hepatic steatosis, no adenopathy...  CXR 10/22/15 showed norm heart size, s/p CABG, patchy airsp dis in right base, incr interstitial markings diffusely, biapical scarring...   CXR 10/28/15 shows resolved patchy RLL pneumonia, norm heart size, s/p CABG, atherosclerotic changes in Ao, etc...   Spirometry 10/28/15>  FVC=1.62 (63%), FEV1=1.19 (60%), %1sec=73, mid-flows reduced at 48% predicted;  This is c/w a mild obstructive ventilatory defect & GOLD Stage2 COPD, can't r/o superimposed restriction w/o LV measurement.  Ambulatory Oximetry 10/28/15> O2sat=100% on RA at rest w/ pulse=61;  Pt ambulated 3Laps in the office w/ lowest O2sat=99% w/ pulse=73/min...   LABS reviewed in EPIC>  She needs A1c, Anemia labs, TSH if not checked in the interval by DrBoushka... IMP >>    COPD, GOLD Stage 2 disease    Ex-smoker-- quit 2002, 45+pack-year smoking hx    CAP 3/17-- NOS, resolved w/ Levaquin,  Doxy, but she claims allergic to everything...    Cardiac issues>  HBP, CAD- s/p 3 vessel CABG in 2002, severe ASPVD w/ left CAE 2011, 51mm right MCA aneurysm-- followed by Elizabeth Mccullough, [facility]- DrDickson, IR-DrDeveshwar...    Medical issues>  HBP, HL, pre-diabetes, HH/GERD/ Divertics, colon polyps, HAs (s/p craniotomy for Arnold-Chiari malformation)-- followed by DrBoushka & DrWillis... PLAN >>     We discussed her COPD and recent hosp for CAP;  She is asked to use the NEBULIZER w/ DUONEB  Tid regularly, DULERA100-2spBid, and MUCINEX 600mg  1-2tabs bid w/ fluids... She will maintain regular follow up w/ her numerous physicians and plan pulm recheck in 6-8 wks...   ~  Dec 12, 2015:  6wk ROV w/ SN>  [name] returns on her NEBULIZER w/ Duoneb Qid, Dulera100-2spBid, & AlbutHFA rescue inhaler as needed; she has finished her prev Pred taper and notes that her breathing is improved & she feels back to baseline- sl cough, small amt whitish sput, min end-exp wheezing, and chr stable DOE, she denies CP/ hemoptysis/ chest tightness/ f/c/s, etc...     COPD, GOLD Stage 2 disease>  Back to baseline on her NEB w/ Duoneb Qid, Dulera100-2spBid, AlbutHFA rescue as needed; Rec to continue same + gradual exercise program & work on wt reduction...    Ex-smoker-- quit 2002, 45+pack-year smoking hx    CAP 3/17-- NOS, resolved w/ Levaquin, Doxy, but she claims allergic to everything...    Cardiac issues>  HBP, CAD- s/p 3 vessel CABG in 2002, severe ASPVD w/ left CAE 2011, 33mm right MCA aneurysm-- followed by Elizabeth Mccullough, [facility]- DrDickson, IR-DrDeveshwar...    Medical issues>  HBP, HL, borderline diabetes, HH/GERD/ Divertics, colon polyps, HAs (s/p craniotomy for Arnold-Chiari malformation), Anemia-- followed by DrBoushka & DrWillis... EXAM shows Afeb, VSS, O2sat=98% on RA; Wt=172#, 5'2"Tall, BMI=31;  HEENT- neg, mallampati1, bilat CBruits & L-CAE scar;  Chest- decrBS, clear w/o w/r/r;  Heart- CABG scar, RR Gr1-2/6 SEM w/o r/g;  Abd- +epig bruit, w/o masses;  Ext- w/o c/ce;  Neuro- w/o focal deficits...  IMP/PLAN>>  [name] is approaching her baseline, s/p pneumonia 10/2015;  Asked to continue NEBs Tid-Qid, Dulera Bid, & rescue inhaler as needed;  She will maintain general med follow up w/ DrBouska, Cards w/ DrHarwani, [facility]- DrDickson, and Neuro- DrWillis;  We plan ROV recheck 3 months...  ADDENDUM>>  [name] went to the ER 02/24/16 c/o nausea, vomiting, & diarrhea assoc w/ some crampy abd pain; nothing acute was  found on exam & eval- given Zofran w/ improvement; she also reported SOB & CXR 02/24/16 showed norm heart size, s/pCABG, hyperexpanded lungs, vague nodular opac in left base & right perihilar areas, otherw clear w/o pneumonia or edema;  She followed up w/ DrBoushka & had a CT Chest done 02/28/16 showing scarring in the apicies, and a dominant spiculated 4mm posterior RLL nodule (prev CTA in 2015 showed it at 79mm), several other tiny opacities noted, no adenopathy;  A subseq PET-CT 03/14/16 confirmed a hypermetabolic RLL nodule & no other hypermet lesions seen;  She had a needle Bx 04/11/16=> pos for squamous cell ca...     She was seen by DrBartle for TSurg but he felt that she was not an operative candidate based on her COPD & severe vascular pathology & suggested SBRT;  She has since been evaluated by [facility] for RadOn[facility] & she is pending the start of XRT at this time...   FullPFTs 04/10/16 showed FVC=1.53 (57%), FEV1=1.09 (54%), %1sec=71, mid-flows reduced at 42%  predicted; post bronchodil FEV1 improved 17% to 1.28L;  TLC=3.77 (79%), RV=2.07 (96%), RV/TLC=55%;  DLCO=51% predicted and the DL/VA=86% predicted...  ~  June 18, 2016:  50mo ROV w/ SN>  & [name] returns stating "I'm tired of doctors"- not feeling well, tired, freezing, giving out w/ min activ & ADLs, SOB all the time, etc... I've reviewed the recent Epic data>>    She was eval by DrBartle 04/04/16> not felt to be a surg candidate due to her severe vasc dis & underlying COPD; needle bx of RLL lesion was pos for Squamous Cell Ca=> referred for XRT & seen by DrManning...    She completed her XRT w/ DrManning & was seen 05/28/16>  s/p curative SBRT- 54 Gy in 3 fractions (05/2016) & tol well...    She saw DrHung, GI for anemia w/u w/ EGD/ Colon 05/25/16>  EGD was WNL;  Colon was also WNL...    She was seen in [place] 06/04/16 w/ cough & right sided CP x1wk, note reviewed, CXR/ CT Angio/ LABS- all below; she was not given antibiotic & disch for f/u by  PCP...    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     We reviewed the following updated medical problems during today's office visit >>     COPD, GOLD Stage 2 disease (w/ revers component)>  Back to baseline on her NEB w/ Duoneb Qid (she is only doing it prn "I don't need it"), Dulera100-2spBid, AlbutHFA rescue as needed; Rec to continue same + gradual exercise program & work on wt reduction...    RLL Pulm nodule=> Bx 04/11/16= Squamous cell ca & pt eval by DrBartle, DrMohamed, DrManning w/ SBRT given 05/2016 (54 Gy in 3 fractions)...    Ex-smoker-- quit 2002, 45+pack-year smoking hx    CAP 3/17-- NOS, resolved w/ Levaquin, Doxy, but she claims allergic to everything; she also had bronchitis 05/2016 treated w/ Augmentiun & improved...    Cardiac issues>  HBP, CAD- s/p 3 vessel CABG in 2002, severe ASPVD w/ left CAE 2011, 8mm right MCA aneurysm-- followed by Elizabeth Mccullough, [facility]- DrDickson, IR-DrDeveshwar...    Medical issues>  HBP, HL, borderline diabetes, HH/GERD/ Divertics, colon polyps, HAs (s/p craniotomy for Arnold-Chiari malformation), Anemia-- followed by DrBoushka & DrWillis... EXAM shows Afeb, VSS, O2sat=100% on RA; Wt=173#, 5'2"Tall, BMI=31;  HEENT- neg, mallampati1, bilat CBruits & L-CAE scar;  Chest- decrBS, clear w/o w/r/r;  Heart- CABG scar, RR Gr1-2/6 SEM w/o r/g;  Abd- +epig bruit, w/o masses;  Ext- w/o c/ce;  Neuro- w/o focal deficits...   CXR 06/04/16>  Norm heart size & prior CABG, 1.4cm RLL nodule, no infiltrate or effusion- NAD...   CT Angio Chest 06/04/16>  s/p CABG & diffuse aortic calcif & heavy  coronary calcif, NEG for PE, stable RLL pulm nodule, no adenopathy, chr changes in the lungs bilat...  LABS 05/2016>  CBC- Hg=9.1, mcv=74;  Chems- Cr=1.30;  Fe=27 (7%sat),  Ferritin=8;  B12=546 IMP/PLAN>>  [name] has been thru a lot but improved after SBRT for RLL squamous cell ca, recent Feraheme for anemia, and Augmentin for bronchitis; she  is encouraged to stay on the [facility] Bid & use the NEBS as needed; definitely needs to incr physical activity & get her weight down... we plan rov in 60mo for pulm check...  ~  November 08, 2016:  4-79mo ROV & post hosp visit/ pulmonary follow up>  [name] is followed by PCP- DrBouska & has bee seen 7 times over the interval;  She was HOSP 2/3 - 09/17/16 w/ BRB per rectum assoc w/ mild lower abd pain; prev eval by GI (DrHung) w/ EGD & Colonoscopy 05/2016 done for IDA (both were NEG), capsule endo was rec but not done;  CT Abd & Pelvis was neg for ischemic colitis;  ASA & Plavix was held & she improved- ?etiology (poss divertic bleed)... We reviewed the following medical problems during today's office visit >>     COPD, GOLD Stage 2 disease (w/ revers component)>  Back to baseline on her NEB w/ Duoneb Qid (she is only doing it prn "I don't need it"), Dulera100-2spBid, AlbutHFA rescue as needed; Rec to continue same + gradual exercise program & work on wt reduction... She was seen [facility] 07/14/16 w/ bronchitic exac(dyspnea, cough, r-rib pain), treated w/ Amox & Toradol for pain... She is too sedentary & SOB w/ ADLs...     RLL Pulm nodule=> Bx 04/11/16= Squamous cell ca & pt eval by DrBartle, DrMohamed, DrManning w/ SBRT given 05/2016 (54 Gy in 3 fractions); Last seen by DrMohamed 08/08/16- Stage 1A non-small-cell lung ca, RLL nodule s/p XRT, & persistent anemia=> given Feraheme 06/2016;  CT Chest 07/2016 showed decr size ofnodule from 90mm to 40mm, no sign mets; DrM rec f/u scan in 2mo...    Ex-smoker-- quit 2002, 45+pack-year smoking hx    CAP 3/17-- NOS, resolved w/ Levaquin,  Doxy, but she claims allergic to everything; she also had bronchitis 05/2016 treated w/ Augmentiun & improved...    Cardiac issues>  HBP, CAD- s/p 3 vessel CABG in 2002, severe ASPVD w/ bilat SFA stents 2003 & left CAE 2011, 63mm right MCA aneurysm-- followed by Elizabeth Mccullough, [facility]- DrDickson (07/2016), IR-DrDeveshwar... CDoppler 07/2016 showed 40-59% R-ICA stenosis & patent L-CAE site w/o restenosis...    Medical issues>  HBP, HL, borderline diabetes, HH/GERD/ Divertics, colon polyps, HAs (s/p craniotomy for Arnold-Chiari malformation), Anemia-- followed by DrBoushka & DrWillis... EXAM shows Afeb, VSS, O2sat=99% on RA; Wt=168#, 5'2"Tall, BMI=31;  HEENT- neg, mallampati1, bilat CBruits & L-CAE scar;  Chest- decrBS, clear w/o w/r/r;  Heart- CABG scar, RR Gr1-2/6 SEM w/o r/g;  Abd- +epig bruit, w/o masses;  Ext- w/o c/ce;  Neuro- w/o focal deficits...   CXR 07/14/16>  S/p CABG, prev RLL nodule not ell vis, stable biapical scarring, NAD...   CT Angio Chest 07/14/16>  NEG for PE, atherosclerosis of Ao/ coronaries/ great vessels; s/p median sternotomy & CABG; no adenopathy, RLL nodule decr to 24mm (prev 33mm), stable scarring lin lingula & LLL, no new lesions...   CXR 11/08/16 (independently reviewed by me in the PACS system) showed norm heart size, s/p CABG, no adenopathy, stable ill-defined RLL nodular opacity (s/p XRT)...  LABS 11/08/16>  CBC- Hg=12.2, mcv=90, Fe=97 (26%sat), Ferritin=59,  TSH=2.17... IMP/PLAN>>  REC to use her NEB (Duoneb) Tid followed by [id number]- 2spBid and start a regular exercise program; continue follow up w/ her PCP-DrBouska & mult specialty physicians...    ~  March 14, 2017:  52mo ROV & [name] reports that she is "doing just fine"- she had pneumonia x 1 in the interval & wants to blame  the Neb rx and Advair "I don't care what I do, I get pneumonia" and "DrBouska says I', his problem child";  It is clear that she is going to REFUSE the NEBS and ADVAIr going forward despite our  conversation to the contrary...  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   CT Chest 01/28/17>  Norm heart size w/ atherosclerosis in coronaries, Ao & branch vessels; no adenopathy; lungs show RLL nodule measures 8x23mm (similar to prev), bilat patchy airsp opac w/ nodular morphology & some tree-in-bud morphology...    She saw PA-Perkins at XRT 01/22/17>  Stage 1A SCCa RLL w/ XRT completed 05/28/16 (54 Gy delivered in 3 fractions of 18Gy each); note reviewed- treated by PCP for left sided pneumonia, and f/u CT w/ stability of the RLL nodule.    She's had mult f/u visits w/ PCP- DrBouska>  Records in Care Everywhere reviewed--  Back pain treated w/ Pred & improved, he presented in June w/ fever to 101, chills, sore throat, deep cough & SOB;  CXR showed patchy LLL opac (confirmed on subseq CT 6/18), and WBC=12.7;  She was treated w/ Augmentin=> later changed to SeptraDS due to ?rash, & improved...   CXR 01/21/17 by Osborne Oman, avail for review in PACS system>  Focal pathcy infiltrate in LLL medially... We reviewed the following medical problems during today's office visit >>     COPD, GOLD Stage 2 disease (w/ revers component)>  She wants to blame her 6/18 LLL pneumonia on her nebulizer & Advair=> refuses to use these going forward! (the truth is that she refuses meds due to cost);  We discussed using the Duoneb prn and trying ANORO one inhalation daily;  She is still too sedentary & NOT motivated to exercise- needs referral to [place] program & she is encouraged to participate!    RLL Pulm nodule=> Bx 04/11/16= Squamous cell ca  & pt eval by DrBartle, DrMohamed, DrManning w/ SBRT given 05/2016 (54 Gy in 3 fractions); Last seen by DrMohamed 01/2017- Stage 1A non-small-cell lung ca, RLL nodule s/p XRT, & persistent anemia=> given Feraheme 06/2016;  CT Chest f/u 6/18 showed stable size of nodule ~7-22mm, no sign mets; DrM rec f/u scan in 83mo...    Ex-smoker-- quit 2002, 45+pack-year smoking hx    CAP 3/17 & 6/18-- NOS, 1st resolved w/ Levaquin, Doxy, but she claims allergic to everything; 2nd resolved w/ Augmentin, SeptraDS; she also had bronchitis 05/2016 treated w/ Augmentiun & improved...    Cardiac issues>  HBP, CAD- s/p 3 vessel CABG in 2002, severe ASPVD w/ bilat SFA stents 2003 & left CAE 2011, 25mm right MCA aneurysm-- followed by Elizabeth Mccullough, [facility]- DrDickson (07/2016), IR-DrDeveshwar... CDoppler 07/2016 showed 40-59% R-ICA stenosis & patent L-CAE site w/o restenosis...    Medical issues>  HBP, HL, borderline diabetes, HH/GERD/ Divertics, colon polyps, HAs (s/p craniotomy for Arnold-Chiari malformation), Anemia-- followed by DrBoushka & DrWillis... EXAM shows Afeb, VSS, O2sat=97% on RA; Wt=163#, 5'2"Tall, BMI=30;  HEENT- neg, mallampati1, bilat CBruits & L-CAE scar;  Chest- decrBS, clear w/o w/r/r;  Heart- CABG scar, RR Gr1-2/6  SEM w/o r/g;  Abd- +epig bruit, w/o masses;  Ext- w/o c/ce;  Neuro- w/o focal deficits...  IMP/PLAN>>  Loanne refuses NEBs and Advair-- try ANORO one inhalation daily & encouraged to use the NEB vs rescue inhaler prn;  She desperately need to incr exercise program & can't vs won't do it on her own-- refer to [place] [facility];  We plan recheck in 4-87mo...     Past Medical History:  Diagnosis Date  . 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 Arnold-Chiari malformation ([place]) 1998  . Asthma   . 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  . Carotid artery occlusion   . Complication of anesthesia   . COPD (chronic obstructive pulmonary disease)  ([place])   . Coronary artery disease   . 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 Diverticulitis   . Gastroesophageal reflux disease   . Glaucoma   . Hiatal hernia   . Hyperlipidemia   . Hypertension   . Iliac artery aneurysm ([place])   . 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 Myocardial infarction [facility]) 01/01/2000   Cardiac catheterization  . Peripheral vascular disease ([place])   . PONV (postoperative nausea and vomiting)    occassionally  . Reflux     Past Surgical History:  Procedure Laterality Date  . ABDOMINAL HYSTERECTOMY    . APPENDECTOMY    . Arnold-chiari malformation repair  1998   Suboccipital craniectomy  . CAROTID ENDARTERECTOMY  03/29/2010   Left  CEA  . CHOLECYSTECTOMY     Gall Bladder  . COLONOSCOPY WITH PROPOFOL N/A 04/22/2015   Procedure: COLONOSCOPY WITH PROPOFOL;  Surgeon: Carol Ada, MD;  Location: [facility] ENDOSCOPY;  Service: Endoscopy;  Laterality: N/A;  . COLONOSCOPY WITH PROPOFOL N/A 05/25/2016   Procedure: COLONOSCOPY WITH PROPOFOL;  Surgeon: Carol Ada, MD;  Location: [facility] ENDOSCOPY;  Service: Endoscopy;  Laterality: N/A;  . CORNEAL TRANSPLANT     Right  . CORONARY ARTERY BYPASS GRAFT  01/01/2000  . ESOPHAGOGASTRODUODENOSCOPY N/A 05/25/2016   Procedure: ESOPHAGOGASTRODUODENOSCOPY (EGD);  Surgeon: Carol Ada, MD;  Location: [facility];  Service: Endoscopy;  Laterality: N/A;  . EYE SURGERY     Laser surgery for retinal hemorrhage  . IR RADIOLOGIST EVAL & MGMT  12/14/2016  . LEFT HEART CATHETERIZATION WITH CORONARY ANGIOGRAM N/A 08/03/2014   Procedure: LEFT HEART CATHETERIZATION WITH CORONARY ANGIOGRAM;  Surgeon: Birdie Riddle, MD;  Location: [facility] C[facility];  Service: Cardiovascular;  Laterality: N/A;  . Post Coronary Artery  BPG  01/05/2000   Right jugular sheath removed  . PR VEIN BYPASS GRAFT,AORTO-FEM-POP    . ROTATOR CUFF REPAIR     Right    Outpatient Encounter Prescriptions as of 03/14/2017  Medication Sig  . acetaminophen (TYLENOL) 500 MG tablet  Take 500 mg by mouth every 6 (six) hours as needed.  Marland Kitchen albuterol (VENTOLIN HFA) 108 (90 BASE) MCG/ACT inhaler Inhale 2 puffs into the lungs every 4 (four) hours as needed for wheezing or shortness of breath (((PLAN B))).  Marland Kitchen amLODipine-olmesartan (AZOR) 5-40 MG tablet Take 1 tablet by mouth daily.  Marland Kitchen aspirin EC 81 MG tablet Take 1 tablet (81 mg total) by mouth daily.  . cloNIDine (CATAPRES) 0.1 MG tablet Take 0.05 mg by mouth 2 (two) times daily.   . clopidogrel (PLAVIX) 75 MG tablet Take 1 tablet (75 mg total) by mouth daily.  Marland Kitchen dexlansoprazole (DEXILANT) 60 MG capsule Take 1 capsule (60  mg total) by mouth daily before breakfast.  . dicyclomine (BENTYL) 10 MG capsule Take 1 capsule (10 mg total) by mouth 4 (four) times daily -  before meals and at bedtime.  . nitroGLYCERIN (NITROSTAT) 0.4 MG SL tablet Place 0.4 mg under the tongue every 5 (five) minutes as needed for chest pain.   . prednisoLONE acetate (PRED FORTE) 1 % ophthalmic suspension Place 1 drop into the right eye every other day.   . rosuvastatin (CRESTOR) 10 MG tablet Take 10 mg by mouth at bedtime.   . [DISCONTINUED] fluticasone-salmeterol (ADVAIR HFA) 115-21 MCG/ACT inhaler Inhale 2 puffs into the lungs 2 (two) times daily.  Marland Kitchen ipratropium-albuterol (DUONEB) 0.5-2.5 (3) MG/3ML SOLN Take 3 mLs by nebulization 4 (four) times daily. (Patient not taking: Reported on 02/06/2017)  . umeclidinium-vilanterol (ANORO ELLIPTA) 62.5-25 MCG/INH AEPB Inhale 1 puff into the lungs daily.  Marland Kitchen umeclidinium-vilanterol (ANORO ELLIPTA) 62.5-25 MCG/INH AEPB Inhale 1 puff into the lungs daily.  . [DISCONTINUED] sulfamethoxazole-trimethoprim (BACTRIM DS,SEPTRA DS) 800-160 MG tablet Take 1 tablet by mouth 2 (two) times daily.   No facility-administered encounter medications on file as of 03/14/2017.     Allergies  Allergen Reactions  . Hydromorphone Palpitations and Other (See Comments)    DILAUDID  -  Pt had a Heart Attack after taking Dilaudid.  . Levaquin  [Levofloxacin] Other (See Comments)    Chest pressure, SOB, "pain in between shoulder blades", sweaty -as reported by patient per experience in ED this afternoon  . Codeine Other (See Comments)    Dr. Terrence Dupont advised patient not to take this medication  . Doxycycline Swelling    Mouth, lips, feet swelling  . Oxycodone-Acetaminophen Other (See Comments)    Says it makes her feel weird  . Risedronate Other (See Comments)    Chest pain  . Avelox [Moxifloxacin Hcl In Nacl] Palpitations    Immunization History  Administered Date(s) Administered  . Influenza Whole 05/13/2012  . Influenza,inj,Quad PF,36+ Mos 04/14/2015, 04/16/2016  . Pneumococcal Conjugate-13 04/14/2015  . Pneumococcal Polysaccharide-23 05/13/2012    Current Medications, Allergies, Past Medical History, Past Surgical History, Family History, and Social History were reviewed in [employer] record.   Review of Systems             All symptoms NEG except where BOLDED >>  Constitutional:  F/C/S, fatigue, anorexia, unexpected weight change. HEENT:  HA, visual changes, hearing loss, earache, nasal symptoms, sore throat, mouth sores, hoarseness. Resp:  cough, sputum, hemoptysis; SOB, tightness, wheezing. Cardio:  CP, palpit, DOE, orthopnea, edema. GI:  N/V/D/C, blood in stool; reflux, abd pain, distention, gas. GU:  dysuria, freq, urgency, hematuria, flank pain, voiding difficulty. MS:  joint pain, swelling, tenderness, decr ROM; neck pain, back pain, etc. Neuro:  HA, tremors, seizures, dizziness, syncope, weakness, numbness, gait abn. Skin:  suspicious lesions or skin rash. Heme:  adenopathy, bruising, bleeding. Psyche:  confusion, agitation, sleep disturbance, hallucinations, anxiety, depression suicidal.   Objective:   Physical Exam       Vital Signs:  Reviewed...   General:  WD, overweight, 76 y/o WF in NAD; alert & oriented; pleasant & cooperative... HEENT:  [place]/AT; Conjunctiva- pink, Sclera-  nonicteric, EOM-wnl, PERRLA, Fundi-benign; EACs-clear, TMs-wnl; NOSE-clear; THROAT-clear & wnl.  Neck:  Supple w/ decr ROM; no JVD; s/p L-CAE, bilat bruits; no thyromegaly or nodules palpated; no lymphadenopathy.  Chest:  decr BS, clear to P & A; without wheezes, rales, or rhonchi heard. Heart:  Regular Rhythm; norm S1 & S2,  Gr1-2/6 SEM, w/o rubs or gallops detected. Abdomen:  Soft & nontender- no guarding or rebound; normal bowel sounds; no organomegaly or masses palpated. Ext:  decrROM; without deformities +arthritic changes; no varicose veins, +venous insuffic, no edema;  Pulses intact w/o bruits. Neuro:  No focal neuro deficits, +gait abn... Derm:  No lesions noted; no rash etc. Lymph:  No cervical, supraclavicular, axillary, or inguinal adenopathy palpated.   Assessment:      IMP >>    COPD, GOLD Stage 2 disease>  Back to baseline on her NEB w/ Duoneb Qid, Dulera100-2spBid, AlbutHFA rescue as needed; Rec to continue same + gradual exercise program & work on wt reduction...    RLL pulm nodule=> Squamous cell ca (Bx 03/2016), s/p XRT 05/2016, followed by [facility] & DrMohamed...    Ex-smoker-- quit 2002, 45+pack-year smoking hx    CAP 3/17-- NOS, resolved w/ Levaquin, Doxy, but she claims allergic to everything...    Cardiac issues>  HBP, CAD- s/p 3 vessel CABG in 2002, severe ASPVD w/ left CAE 2011, 13mm right MCA aneurysm-- followed by Elizabeth Mccullough, [facility]- DrDickson, IR-DrDeveshwar...    Medical issues>  HBP, HL, borderline diabetes, HH/GERD/ Divertics, colon polyps, HAs (s/p craniotomy for Arnold-Chiari malformation), Anemia-- followed by DrBoushka & DrWillis...  PLAN >>  10/28/15>   We discussed her COPD and recent hosp for CAP;  She is asked to use the NEBULIZER w/ DUONEB Tid regularly, DULERA100-2spBid, and MUCINEX 600mg  1-2tabs bid w/ fluids... She will maintain regular follow up w/ her numerous physicians and plan pulm recheck in 6-8 wks. 12/12/15>   [name] is approaching her  baseline, s/p pneumonia 10/2015;  Asked to continue NEBs Tid-Qid, Dulera Bid, & rescue inhaler as needed;  She will maintain general med follow up w/ DrBouska, Cards w/ DrHarwani, [facility]- DrDickson, and Neuro- DrWillis;  We plan ROV recheck 3 months 06/18/16>  [name] has been thru a lot but improved after SBRT for RLL squamous cell ca, recent Feraheme for anemia, and Augmentin for bronchitis; she is encouraged to stay on the [facility] Bid & use the NEBS as needed; definitely needs to incr physical activity & get her weight down... we plan rov in 67mo for pulm check... 11/08/16>   REC to use her NEB (Duoneb) Tid followed by [id number]- 2spBid and start a regular exercise program; continue follow up w/ her PCP-DrBouska & mult specialty physicians... 03/14/17>   [name] refuses NEBs and Advair-- try ANORO one inhalation daily & encouraged to use the NEB vs rescue inhaler prn;  She desperately need to incr exercise program & can't vs won't do it on her own-- refer to [place] [facility];  We plan recheck in 4-65mo...   Plan:     Patient's Medications  New Prescriptions   UMECLIDINIUM-VILANTEROL (ANORO ELLIPTA) 62.5-25 MCG/INH AEPB    Inhale 1 puff into the lungs daily.   UMECLIDINIUM-VILANTEROL (ANORO ELLIPTA) 62.5-25 MCG/INH AEPB    Inhale 1 puff into the lungs daily.  Previous Medications   ACETAMINOPHEN (TYLENOL) 500 MG TABLET    Take 500 mg by mouth every 6 (six) hours as needed.   ALBUTEROL (VENTOLIN HFA) 108 (90 BASE) MCG/ACT INHALER    Inhale 2 puffs into the lungs every 4 (four) hours as needed for wheezing or shortness of breath (((PLAN B))).   AMLODIPINE-OLMESARTAN (AZOR) 5-40 MG TABLET    Take 1 tablet by mouth daily.   ASPIRIN EC 81 MG TABLET    Take 1 tablet (81 mg total) by mouth daily.  CLONIDINE (CATAPRES) 0.1 MG TABLET    Take 0.05 mg by mouth 2 (two) times daily.    CLOPIDOGREL (PLAVIX) 75 MG TABLET    Take 1 tablet (75 mg total) by mouth daily.   DEXLANSOPRAZOLE (DEXILANT) 60 MG CAPSULE    Take  1 capsule (60 mg total) by mouth daily before breakfast.   DICYCLOMINE (BENTYL) 10 MG CAPSULE    Take 1 capsule (10 mg total) by mouth 4 (four) times daily -  before meals and at bedtime.   IPRATROPIUM-ALBUTEROL (DUONEB) 0.5-2.5 (3) MG/3ML SOLN    Take 3 mLs by nebulization 4 (four) times daily.   NITROGLYCERIN (NITROSTAT) 0.4 MG SL TABLET    Place 0.4 mg under the tongue every 5 (five) minutes as needed for chest pain.    PREDNISOLONE ACETATE (PRED FORTE) 1 % OPHTHALMIC SUSPENSION    Place 1 drop into the right eye every other day.    ROSUVASTATIN (CRESTOR) 10 MG TABLET    Take 10 mg by mouth at bedtime.   Modified Medications   No medications on file  Discontinued Medications   FLUTICASONE-SALMETEROL (ADVAIR HFA) 115-21 MCG/ACT INHALER    Inhale 2 puffs into the lungs 2 (two) times daily.   SULFAMETHOXAZOLE-TRIMETHOPRIM (BACTRIM DS,SEPTRA DS) 800-160 MG TABLET    Take 1 tablet by mouth 2 (two) times daily.

## 2017-03-14 NOTE — Patient Instructions (Signed)
Today we updated your med list in our EPIC system...    We decided on a change in meds due to the "diificulty" you've had w/ the Advair...    Try the new ANORO just one inhalation once daily...  Continue the NEBS & ProairHFA as "rescue"medications for symptoms of shortness of breath or wheezing...  We also discussed the NEED & huge BENEFIT froman active PULMONARY REHAB program...    We will make a referal to the [facility] program!  Call for any questions...  Let's plan a follow up visit in 4-44mo, sooner if needed for problems.Marland KitchenMarland Kitchen

## 2017-03-28 ENCOUNTER — Institutional Professional Consult (permissible substitution): Payer: Self-pay | Admitting: Neurology

## 2017-04-18 ENCOUNTER — Other Ambulatory Visit: Payer: Self-pay | Admitting: Gastroenterology

## 2017-04-30 ENCOUNTER — Encounter (HOSPITAL_COMMUNITY): Payer: Self-pay | Admitting: *Deleted

## 2017-05-02 NOTE — Anesthesia Preprocedure Evaluation (Addendum)
Anesthesia Evaluation  Patient identified by MRN, date of birth, ID band Patient awake    Reviewed: Allergy & Precautions, H&P , NPO status , Patient's Chart, lab work & pertinent test results  History of Anesthesia Complications (+) PONV and history of anesthetic complications  Airway Mallampati: II  TM Distance: >3 FB Neck ROM: full    Dental no notable dental hx. (+) Edentulous Upper, Edentulous Lower, Upper Dentures, Lower Dentures   Pulmonary shortness of breath and with exertion, asthma , COPD,  COPD inhaler, former smoker,    Pulmonary exam normal breath sounds clear to auscultation       Cardiovascular hypertension, Pt. on medications + CAD, + Past MI, + CABG and + Peripheral Vascular Disease  Normal cardiovascular exam Rhythm:regular Rate:Normal + Systolic murmurs    Neuro/Psych Bilateral occipital neuralgia. CEA  Neuromuscular disease negative neurological ROS  negative psych ROS   GI/Hepatic negative GI ROS, Neg liver ROS, hiatal hernia, GERD  Medicated and Controlled,  Endo/Other  negative endocrine ROS  Renal/GU negative Renal ROS  negative genitourinary   Musculoskeletal   Abdominal   Peds  Hematology negative hematology ROS (+)   Anesthesia Other Findings   Reproductive/Obstetrics negative OB ROS                            Anesthesia Physical  Anesthesia Plan  ASA: III  Anesthesia Plan: MAC   Post-op Pain Management:    Induction:   PONV Risk Score and Plan: 2 and Ondansetron, Dexamethasone and Treatment may vary due to age or medical condition  Airway Management Planned: Nasal Cannula, Natural Airway and Simple Face Mask  Additional Equipment:   Intra-op Plan:   Post-operative Plan:   Informed Consent: I have reviewed the patients History and Physical, chart, labs and discussed the procedure including the risks, benefits and alternatives for the proposed  anesthesia with the patient or authorized representative who has indicated his/her understanding and acceptance.   Dental Advisory Given  Plan Discussed with: CRNA and Surgeon  Anesthesia Plan Comments:        Anesthesia Quick Evaluation

## 2017-05-03 ENCOUNTER — Ambulatory Visit (HOSPITAL_COMMUNITY): Payer: Medicare HMO | Admitting: Anesthesiology

## 2017-05-03 ENCOUNTER — Ambulatory Visit (HOSPITAL_COMMUNITY)
Admission: RE | Admit: 2017-05-03 | Discharge: 2017-05-03 | Disposition: A | Discharge status: Home / Self Care | Payer: Medicare HMO | Source: Ambulatory Visit | Attending: Gastroenterology | Admitting: Gastroenterology

## 2017-05-03 ENCOUNTER — Encounter (HOSPITAL_COMMUNITY)
Admission: RE | Disposition: A | Discharge status: Home / Self Care | Payer: Self-pay | Source: Ambulatory Visit | Attending: Gastroenterology

## 2017-05-03 ENCOUNTER — Encounter (HOSPITAL_COMMUNITY): Payer: Self-pay | Admitting: *Deleted

## 2017-05-03 DIAGNOSIS — Z801 Family history of malignant neoplasm of trachea, bronchus and lung: Secondary | ICD-10-CM | POA: Diagnosis not present

## 2017-05-03 DIAGNOSIS — Z85118 Personal history of other malignant neoplasm of bronchus and lung: Secondary | ICD-10-CM | POA: Diagnosis not present

## 2017-05-03 DIAGNOSIS — Z79899 Other long term (current) drug therapy: Secondary | ICD-10-CM | POA: Insufficient documentation

## 2017-05-03 DIAGNOSIS — I2510 Atherosclerotic heart disease of native coronary artery without angina pectoris: Secondary | ICD-10-CM | POA: Diagnosis not present

## 2017-05-03 DIAGNOSIS — J449 Chronic obstructive pulmonary disease, unspecified: Secondary | ICD-10-CM | POA: Insufficient documentation

## 2017-05-03 DIAGNOSIS — Z888 Allergy status to other drugs, medicaments and biological substances status: Secondary | ICD-10-CM | POA: Diagnosis not present

## 2017-05-03 DIAGNOSIS — I739 Peripheral vascular disease, unspecified: Secondary | ICD-10-CM | POA: Insufficient documentation

## 2017-05-03 DIAGNOSIS — Z885 Allergy status to narcotic agent status: Secondary | ICD-10-CM | POA: Insufficient documentation

## 2017-05-03 DIAGNOSIS — E785 Hyperlipidemia, unspecified: Secondary | ICD-10-CM | POA: Diagnosis not present

## 2017-05-03 DIAGNOSIS — Z881 Allergy status to other antibiotic agents status: Secondary | ICD-10-CM | POA: Diagnosis not present

## 2017-05-03 DIAGNOSIS — Z7902 Long term (current) use of antithrombotics/antiplatelets: Secondary | ICD-10-CM | POA: Insufficient documentation

## 2017-05-03 DIAGNOSIS — Z87891 Personal history of nicotine dependence: Secondary | ICD-10-CM | POA: Diagnosis not present

## 2017-05-03 DIAGNOSIS — K219 Gastro-esophageal reflux disease without esophagitis: Secondary | ICD-10-CM | POA: Diagnosis not present

## 2017-05-03 DIAGNOSIS — Z8249 Family history of ischemic heart disease and other diseases of the circulatory system: Secondary | ICD-10-CM | POA: Diagnosis not present

## 2017-05-03 DIAGNOSIS — Z86010 Personal history of colonic polyps: Secondary | ICD-10-CM | POA: Insufficient documentation

## 2017-05-03 DIAGNOSIS — I10 Essential (primary) hypertension: Secondary | ICD-10-CM | POA: Diagnosis not present

## 2017-05-03 DIAGNOSIS — K921 Melena: Secondary | ICD-10-CM | POA: Diagnosis not present

## 2017-05-03 DIAGNOSIS — Z95828 Presence of other vascular implants and grafts: Secondary | ICD-10-CM | POA: Insufficient documentation

## 2017-05-03 HISTORY — DX: Pneumonia, unspecified organism: J18.9

## 2017-05-03 HISTORY — PX: COLONOSCOPY WITH PROPOFOL: SHX5780

## 2017-05-03 HISTORY — DX: Headache: R51

## 2017-05-03 HISTORY — DX: Dyspnea, unspecified: R06.00

## 2017-05-03 HISTORY — DX: Melena: K92.1

## 2017-05-03 HISTORY — DX: Headache, unspecified: R51.9

## 2017-05-03 SURGERY — COLONOSCOPY WITH PROPOFOL
Anesthesia: Monitor Anesthesia Care

## 2017-05-03 MED ORDER — LIDOCAINE 2% (20 MG/ML) 5 ML SYRINGE
INTRAMUSCULAR | Status: DC | PRN
  Administered 2017-05-03: 60 mg via INTRAVENOUS

## 2017-05-03 MED ORDER — ONDANSETRON HCL 4 MG/2ML IJ SOLN
INTRAMUSCULAR | Status: AC
  Filled 2017-05-03: qty 2

## 2017-05-03 MED ORDER — ONDANSETRON HCL 4 MG/2ML IJ SOLN
INTRAMUSCULAR | Status: DC | PRN
Start: 2017-05-03 — End: 2017-05-03
  Administered 2017-05-03: 4 mg via INTRAVENOUS

## 2017-05-03 MED ORDER — PROPOFOL 10 MG/ML IV BOLUS
INTRAVENOUS | Status: AC
  Filled 2017-05-03: qty 20

## 2017-05-03 MED ORDER — PROPOFOL 10 MG/ML IV BOLUS
INTRAVENOUS | Status: DC | PRN
  Administered 2017-05-03: 20 mg via INTRAVENOUS
  Administered 2017-05-03: 30 mg via INTRAVENOUS
  Administered 2017-05-03 (×6): 20 mg via INTRAVENOUS
  Administered 2017-05-03: 30 mg via INTRAVENOUS
  Administered 2017-05-03: 20 mg via INTRAVENOUS

## 2017-05-03 MED ORDER — LACTATED RINGERS IV SOLN
INTRAVENOUS | Status: DC
  Administered 2017-05-03: 12:00:00 via INTRAVENOUS

## 2017-05-03 MED ORDER — LIDOCAINE 2% (20 MG/ML) 5 ML SYRINGE
INTRAMUSCULAR | Status: AC
  Filled 2017-05-03: qty 5

## 2017-05-03 MED ORDER — SODIUM CHLORIDE 0.9 % IV SOLN: INTRAVENOUS | Status: DC

## 2017-05-03 SURGICAL SUPPLY — 22 items

## 2017-05-03 NOTE — Anesthesia Postprocedure Evaluation (Signed)
Anesthesia Post Note  Patient: [name]  Procedure(s) Performed: Procedure(s) (LRB): COLONOSCOPY WITH PROPOFOL (N/A)     Patient location during evaluation: PACU Anesthesia Type: MAC Level of consciousness: awake and alert Pain management: pain level controlled Vital Signs Assessment: post-procedure vital signs reviewed and stable Respiratory status: spontaneous breathing, nonlabored ventilation, respiratory function stable and patient connected to nasal cannula oxygen Cardiovascular status: stable and blood pressure returned to baseline Postop Assessment: no apparent nausea or vomiting Anesthetic complications: no    Last Vitals:  Vitals:   05/03/17 1358 05/03/17 1400  BP: (!) 125/42 (!) 124/49  Pulse: (!) 56 (!) 57  Resp: (!) 21 (!) 22  Temp: 36.7 C   SpO2: 100% 100%    Last Pain:  Vitals:   05/03/17 1358  TempSrc: Oral                 Chellie Vanlue

## 2017-05-03 NOTE — Op Note (Signed)
[facility] Patient Name: [name] Procedure Date: 05/03/2017 MRN: [id number] Attending MD: Carol Ada , MD Date of Birth: 19-Mar-1941 CSN: [id number] Age: 76 Admit Type: Outpatient Procedure:                Colonoscopy Indications:              Hematochezia Providers:                Carol Ada, MD, Carmie End, RN, Cherylynn Ridges, Technician, Marla Roe, CRNA Referring MD:              Medicines:                Propofol per Anesthesia Complications:            No immediate complications. Estimated Blood Loss:     Estimated blood loss: none. Procedure:                Pre-Anesthesia Assessment:                           - Prior to the procedure, a History and Physical                            was performed, and patient medications and                            allergies were reviewed. The patient's tolerance of                            previous anesthesia was also reviewed. The risks                            and benefits of the procedure and the sedation                            options and risks were discussed with the patient.                            All questions were answered, and informed consent                            was obtained. Prior Anticoagulants: The patient has                            taken no previous anticoagulant or antiplatelet                            agents. ASA Grade Assessment: III - A patient with                            severe systemic disease. After reviewing the risks                            and  benefits, the patient was deemed in                            satisfactory condition to undergo the procedure.                           - Sedation was administered by an anesthesia                            professional. Deep sedation was attained.                           After obtaining informed consent, the colonoscope                            was passed under direct vision.  Throughout the                            procedure, the patient's blood pressure, pulse, and                            oxygen saturations were monitored continuously. The                            EC-[id number] ([id number]) scope was introduced through                            the anus and advanced to the the terminal ileum.                            The colonoscopy was performed without difficulty.                            The patient tolerated the procedure well. The                            quality of the bowel preparation was good. The                            terminal ileum, ileocecal valve, appendiceal                            orifice, and rectum were photographed. Scope In: 1:36:43 PM Scope Out: 1:52:59 PM Scope Withdrawal Time: 0 hours 13 minutes 24 seconds  Total Procedure Duration: 0 hours 16 minutes 16 seconds  Findings:      The entire examined colon appeared normal. Impression:               - The entire examined colon is normal.                           - No specimens collected. Moderate Sedation:      N/A- Per Anesthesia Care Recommendation:           - Patient has a contact number available for  emergencies. The signs and symptoms of potential                            delayed complications were discussed with the                            patient. Return to normal activities tomorrow.                            Written discharge instructions were provided to the                            patient.                           - Resume previous diet.                           - Continue present medications.                           - Await pathology results.                           - Repeat colonoscopy in 5 years for surveillance,                            if it is clinically appropriate. Her 2016                            colonoscopy was significant for 4 adenomas and she                            has a history of GI  bleeding. Procedure Code(s):        --- Professional ---                           [phone], Colonoscopy, flexible; diagnostic, including                            collection of specimen(s) by brushing or washing,                            when performed (separate procedure) Diagnosis Code(s):        --- Professional ---                           K92.1, Melena (includes Hematochezia) CPT copyright 2016 American Medical Association. All rights reserved. The codes documented in this report are preliminary and upon coder review may  be revised to meet current compliance requirements. Carol Ada, MD Carol Ada, MD 05/03/2017 1:55:45 PM This report has been signed electronically. Number of Addenda: 0

## 2017-05-03 NOTE — Transfer of Care (Signed)
Immediate Anesthesia Transfer of Care Note  Patient: [name]  Procedure(s) Performed: Procedure(s): COLONOSCOPY WITH PROPOFOL (N/A)  Patient Location: PACU  Anesthesia Type:MAC  Level of Consciousness:  sedated, patient cooperative and responds to stimulation  Airway & Oxygen Therapy:Patient Spontanous Breathing and Patient connected to face mask oxgen  Post-op Assessment:  Report given to PACU RN and Post -op Vital signs reviewed and stable  Post vital signs:  Reviewed and stable  Last Vitals:  Vitals:   05/03/17 1109  BP: (!) 155/48  Pulse: (!) 56  Resp: 18  Temp: 36.7 C  SpO2: 75%    Complications: No apparent anesthesia complications

## 2017-05-03 NOTE — H&P (Signed)
[name] HPI: In the past she was evaluated for her IDA. Her last colonoscpoy in 2016 was significant for 4 small adenomas. Recently she had a harder time to have a bowel movement, but she does not consider her symptoms as constipation. She used a laxative and she had hematochezia, which was painless. The bleeding lasted for a couple of days and then it resolved. There were some clots. She was offered an earlier office visit, but she declined and decided to hold off until today. Dr. Terrence Dupont manages her Plavix.   Past Medical History:  Diagnosis Date  . 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 Arnold-Chiari malformation ([place]) 1998  . Asthma   . 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  . Blood in stool    last week of aug 2018  . Carotid artery occlusion   . Complication of anesthesia   . COPD (chronic obstructive pulmonary disease) ([place])   . Coronary artery disease   . 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 Diverticulitis   . Dyspnea    with exertion  . Gastroesophageal reflux disease    occ  . Glaucoma    right eye  . Headache    tension  . Hiatal hernia   . Hyperlipidemia   . Hypertension   . Iliac artery aneurysm ([place])   . Lung cancer ([place]) dx 2018   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  . Myocardial infarction [facility]) 01/01/2000   Cardiac catheterization  . Peripheral vascular disease ([place])    stents in legs x 2 or 3  . Pneumonia    last time winter 2017 -2018  . PONV (postoperative nausea and vomiting)    occassionally, last colonscopy did ok with anesthesia  . Reflux     Past Surgical History:  Procedure Laterality Date  . ABDOMINAL HYSTERECTOMY    . APPENDECTOMY    . Arnold-chiari malformation repair  1998   Suboccipital craniectomy  . CAROTID ENDARTERECTOMY  03/29/2010   Left  CEA  . CHOLECYSTECTOMY     Gall Bladder  . COLONOSCOPY WITH PROPOFOL N/A 04/22/2015   Procedure: COLONOSCOPY WITH  PROPOFOL;  Surgeon: Carol Ada, MD;  Location: [facility] ENDOSCOPY;  Service: Endoscopy;  Laterality: N/A;  . COLONOSCOPY WITH PROPOFOL N/A 05/25/2016   Procedure: COLONOSCOPY WITH PROPOFOL;  Surgeon: Carol Ada, MD;  Location: [facility] ENDOSCOPY;  Service: Endoscopy;  Laterality: N/A;  . CORNEAL TRANSPLANT     Right  . CORONARY ARTERY BYPASS GRAFT  01/01/2000   x 3  . ESOPHAGOGASTRODUODENOSCOPY N/A 05/25/2016   Procedure: ESOPHAGOGASTRODUODENOSCOPY (EGD);  Surgeon: Carol Ada, MD;  Location: [facility];  Service: Endoscopy;  Laterality: N/A;  . EYE SURGERY Right 1995 or 1996   Laser surgery for retinal hemorrhage  . IR RADIOLOGIST EVAL & MGMT  12/14/2016  . LEFT HEART CATHETERIZATION WITH CORONARY ANGIOGRAM N/A 08/03/2014   Procedure: LEFT HEART CATHETERIZATION WITH CORONARY ANGIOGRAM;  Surgeon: Birdie Riddle, MD;  Location: [place] CATH LAB;  Service: Cardiovascular;  Laterality: N/A;  . Post Coronary Artery  BPG  01/05/2000   Right jugular sheath removed  . PR VEIN BYPASS GRAFT,AORTO-FEM-POP    . ROTATOR CUFF REPAIR     Right    Family History  Problem Relation Age of Onset  . Heart disease Mother        Heart Disease before age 3  . Hypertension Mother   . Hyperlipidemia Mother   . Heart  attack Mother   . Clotting disorder Mother   . Heart disease Father        Heart Disease before age 16  . Heart attack Father   . Hyperlipidemia Father   . Hypertension Father   . Heart disease Brother        Heart Disease before age 33  . Hyperlipidemia Brother   . Hypertension Brother   . Clotting disorder Brother   . AAA (abdominal aortic aneurysm) Brother   . Cerebral aneurysm Sister   . Hypertension Sister   . AAA (abdominal aortic aneurysm) Sister   . Asthma Sister   . Cerebral aneurysm Brother   . Cancer Brother        Lung  . Hypertension Brother   . Heart attack Brother   . Heart disease Brother        Aneurysm of Brain  . Hypertension Brother   . Heart disease Brother   . Heart  disease Brother   . Stroke Son        Aneurysm of Stomach  . AAA (abdominal aortic aneurysm) Son   . Cancer Maternal Uncle        great uncle/cancer/type unknown    Social History:  reports that she quit smoking about 16 years ago. Her smoking use included Cigarettes. She has a 45.00 pack-year smoking history. She has never used smokeless tobacco. She reports that she does not drink alcohol or use drugs.  Allergies:  Allergies  Allergen Reactions  . Hydromorphone Palpitations and Other (See Comments)    DILAUDID  -  Pt had a Heart Attack after taking Dilaudid.  . Levaquin [Levofloxacin] Other (See Comments)    Chest pressure, SOB, "pain in between shoulder blades", sweaty -as reported by patient per experience in ED this afternoon  . Azithromycin     Patient reported past history of lip swelling  . Codeine Other (See Comments)    Dr. Terrence Dupont advised patient not to take this medication  . Doxycycline Swelling    Mouth, lips, feet swelling  . Oxycodone-Acetaminophen Other (See Comments)    Says it makes her feel weird  . Risedronate Other (See Comments)    Chest pain  . Avelox [Moxifloxacin Hcl In Nacl] Palpitations    Medications:  Scheduled:  Continuous: . sodium chloride      No results found for this or any previous visit (from the past 24 hour(s)).   No results found.  ROS:  As stated above in the HPI otherwise negative.  There were no vitals taken for this visit.    PE: Gen: NAD, Alert and Oriented HEENT:  [place]/AT, EOMI Neck: Supple, no LAD Lungs: CTA Bilaterally CV: RRR without M/G/R ABM: Soft, NTND, +BS Ext: No C/C/E  Assessment/Plan: 1) Hematochezia. 2) History of IDA. 3) Personal history of polyps.   The bleeding is rather significant. I will check her HGB today, but clinically she feels well. She has a history of diverticula and she may have bled from the diverticula versus hemorrhoids on Plavix. With the change in her symptoms, I think it is prudent  to repeat the colonoscopy at this time. , I have discussed the risks of bleeding, infection, perforation, medication reactions, a 10% miss rate for a small colon cancer or polyp, and the risk of death. All questions were answered and the patient acknowledges these risks and wishes to proceed.   Pernell Lenoir D 05/03/2017, 11:05 AM

## 2017-05-06 ENCOUNTER — Encounter (HOSPITAL_COMMUNITY): Payer: Self-pay | Admitting: Gastroenterology

## 2017-05-13 ENCOUNTER — Telehealth: Payer: Self-pay | Admitting: Pulmonary Disease

## 2017-05-13 MED ORDER — UMECLIDINIUM-VILANTEROL 62.5-25 MCG/INH IN AEPB: 1.0000 | INHALATION_SPRAY | Freq: Every day | RESPIRATORY_TRACT | 2 refills | Status: DC

## 2017-05-13 NOTE — Telephone Encounter (Signed)
Spoke with pt, advised Rx sent to [facility]. Nothing further is needed.

## 2017-07-15 ENCOUNTER — Ambulatory Visit (INDEPENDENT_AMBULATORY_CARE_PROVIDER_SITE_OTHER): Payer: Medicare HMO | Admitting: Pulmonary Disease

## 2017-07-15 ENCOUNTER — Telehealth (HOSPITAL_COMMUNITY): Payer: Self-pay

## 2017-07-15 ENCOUNTER — Encounter: Payer: Self-pay | Admitting: Pulmonary Disease

## 2017-07-15 VITALS — BP 108/60 | HR 61 | Temp 97.9°F | Ht 62.0 in | Wt 157.0 lb

## 2017-07-15 DIAGNOSIS — I10 Essential (primary) hypertension: Secondary | ICD-10-CM

## 2017-07-15 DIAGNOSIS — J449 Chronic obstructive pulmonary disease, unspecified: Secondary | ICD-10-CM

## 2017-07-15 DIAGNOSIS — C3431 Malignant neoplasm of lower lobe, right bronchus or lung: Secondary | ICD-10-CM

## 2017-07-15 DIAGNOSIS — R0602 Shortness of breath: Secondary | ICD-10-CM

## 2017-07-15 DIAGNOSIS — R5381 Other malaise: Secondary | ICD-10-CM

## 2017-07-15 DIAGNOSIS — I739 Peripheral vascular disease, unspecified: Secondary | ICD-10-CM

## 2017-07-15 DIAGNOSIS — Z951 Presence of aortocoronary bypass graft: Secondary | ICD-10-CM

## 2017-07-15 DIAGNOSIS — I6523 Occlusion and stenosis of bilateral carotid arteries: Secondary | ICD-10-CM

## 2017-07-15 DIAGNOSIS — I2510 Atherosclerotic heart disease of native coronary artery without angina pectoris: Secondary | ICD-10-CM

## 2017-07-15 DIAGNOSIS — D500 Iron deficiency anemia secondary to blood loss (chronic): Secondary | ICD-10-CM | POA: Diagnosis not present

## 2017-07-15 NOTE — Telephone Encounter (Signed)
Received referral. Order has multiple DX. Called Dr.Nadels office to request new referral with just one DX.

## 2017-07-15 NOTE — Progress Notes (Signed)
Subjective:     Patient ID: [name], female   DOB: [birth year], 76 y.o.   MRN: [id number]  HPI  ~  October 28, 2015:  Initial pulmonary consult by SN>  PCP is DrBouska    5 y/o WF, [name], w/ hx COPD, HBP, CAD-s/p 3 vessel CABG, severe ASPVD, HL, HH/GERD, Divertics, etc...    She was prev evaluated by DrWert in 2014 & DrGonzalez before that>  She had mult medication intolerances and seemed to do better w/ NEBS; also had hx chr sinusitis & GERD...     Daughter called w/ request for a post-hosp f/u visit>  She was [facility] 3/11 - 10/24/15 by [facility] w/ CAP, COPD exacerbation;  She presented w/ cough, small amt yellow sput, temp to 101, and incr SOB;  ER eval revealed T101.3, & exam w/ decr BS +wheezes and basilar rhonchi;  CXR showed patchy RLL airsp dis, Flu panel neg, unable to get sput for culture, Labs- Cr=1.27, BS~140-190, Hg~10-11 w/ MCV=83;  She was treated w/ O2, IVF, Duoneb, Levaquin & Pred;  Disch on Duoneb via nebulizer Qid, Dulera100-2spBid, Doxy100Bid, Pred taper...     Currently she feels back to baseline w/ sl cough, white sput & no discoloration or blood, +DOE w/ exertion and some ADLs, no CP, palpit, dizzy, edema;  On NEB w/ DUONEB Qid, DULERA100-2spBid, Pred10mg  tapering sched from recent [place] as needed, Zyrtek prn...  Smoking Hx>  Ex-smoker- started age 61, smoked for ~40 yrs up to 1+ppd, and quit in 2002 when she had CABG; total = 45+pack-year smoking history...   Pulmonary Hx>  Hx "asthma", COPD, ex-smoker, hx recurrent bronchits, hx pneumonia 3/17  Medical Hx>  HBP, CAD- s/p 3 vessel CABG in 2002, severe ASPVD w/ Ao-Fem-Pop & left CAE 2011, 9mm right MCA aneurysm, HH/GERD/ Divertics, colon polyps, HAs (s/p craniotomy for Arnold-chiari malformation)  Family Hx>  One sis w/ asthma, otherw all atherosclerosis related...  Occup Hx>  No known asbestos or toxic exposures  Current Meds>  ASA/Plavix, AZOR 5/40, Catapress0.1-1/2Bid, Crestor10, Dexilant60,  (not on DM meds- "I'm pre-diabetic")... EXAM shows Afeb, VSS, O2sat=98% on RA; Wt=174#, 5'2"Tall, BMI=31;  HEENT- neg, mallampati1, bilat CBruits & L-CAE scar;  Chest- decrBS, clear w/o w/r/r;  Heart- CABG scar, RR Gr1-2/6 SEM w/o r/g;  Abd- +epig bruit, w/o masses;  Ext- w/o c/ce;  Neuro- w/o focal deficits...   PF reported from 2013>  FEV1=1.30 (58%), %1sec=65, no change post bronchdil, DLCO=74%  CT Angio Chest 08/02/14 showed NEG for PE, extensive CAD- s/p CABG, subsolid nodules in RML/ RLL pleural thickening & bibasilar atx, hepatic steatosis, no adenopathy...  CXR 10/22/15 showed norm heart size, s/p CABG, patchy airsp dis in right base, incr interstitial markings diffusely, biapical scarring...   CXR 10/28/15 shows resolved patchy RLL pneumonia, norm heart size, s/p CABG, atherosclerotic changes in Ao, etc...   Spirometry 10/28/15>  FVC=1.62 (63%), FEV1=1.19 (60%), %1sec=73, mid-flows reduced at 48% predicted;  This is c/w a mild obstructive ventilatory defect & GOLD Stage2 COPD, can't r/o superimposed restriction w/o LV measurement.  Ambulatory Oximetry 10/28/15> O2sat=100% on RA at rest w/ pulse=61;  Pt ambulated 3Laps in the office w/ lowest O2sat=99% w/ pulse=73/min...   LABS reviewed in EPIC>  She needs A1c, Anemia labs, TSH if not checked in the interval by DrBoushka... IMP >>    COPD, GOLD Stage 2 disease    Ex-smoker-- quit 2002, 45+pack-year smoking hx    CAP 3/17-- NOS, resolved w/ Levaquin,  Doxy, but she claims allergic to everything...    Cardiac issues>  HBP, CAD- s/p 3 vessel CABG in 2002, severe ASPVD w/ left CAE 2011, 5mm right MCA aneurysm-- followed by [name], VVS- DrDickson, IR-DrDeveshwar...    Medical issues>  HBP, HL, pre-diabetes, HH/GERD/ Divertics, colon polyps, HAs (s/p craniotomy for Arnold-Chiari malformation)-- followed by DrBoushka & DrWillis... PLAN >>     We discussed her COPD and recent hosp for CAP;  She is asked to use the NEBULIZER w/ DUONEB  Tid regularly, DULERA100-2spBid, and MUCINEX 600mg  1-2tabs bid w/ fluids... She will maintain regular follow up w/ her numerous physicians and plan pulm recheck in 6-8 wks...   ~  Dec 12, 2015:  6wk ROV w/ SN>  [name] returns on her NEBULIZER w/ Duoneb Qid, Dulera100-2spBid, & AlbutHFA rescue inhaler as needed; she has finished her prev Pred taper and notes that her breathing is improved & she feels back to baseline- sl cough, small amt whitish sput, min end-exp wheezing, and chr stable DOE, she denies CP/ hemoptysis/ chest tightness/ f/c/s, etc...     COPD, GOLD Stage 2 disease>  Back to baseline on her NEB w/ Duoneb Qid, Dulera100-2spBid, AlbutHFA rescue as needed; Rec to continue same + gradual exercise program & work on wt reduction...    Ex-smoker-- quit 2002, 45+pack-year smoking hx    CAP 3/17-- NOS, resolved w/ Levaquin, Doxy, but she claims allergic to everything...    Cardiac issues>  HBP, CAD- s/p 3 vessel CABG in 2002, severe ASPVD w/ left CAE 2011, 7mm right MCA aneurysm-- followed by [name], [facility]- DrDickson, IR-DrDeveshwar...    Medical issues>  HBP, HL, borderline diabetes, HH/GERD/ Divertics, colon polyps, HAs (s/p craniotomy for Arnold-Chiari malformation), Anemia-- followed by DrBoushka & DrWillis... EXAM shows Afeb, VSS, O2sat=98% on RA; Wt=172#, 5'2"Tall, BMI=31;  HEENT- neg, mallampati1, bilat CBruits & L-CAE scar;  Chest- decrBS, clear w/o w/r/r;  Heart- CABG scar, RR Gr1-2/6 SEM w/o r/g;  Abd- +epig bruit, w/o masses;  Ext- w/o c/ce;  Neuro- w/o focal deficits...  IMP/PLAN>>  [name] is approaching her baseline, s/p pneumonia 10/2015;  Asked to continue NEBs Tid-Qid, Dulera Bid, & rescue inhaler as needed;  She will maintain general med follow up w/ DrBouska, Cards w/ DrHarwani, VVS- DrDickson, and Neuro- DrWillis;  We plan ROV recheck 3 months...  ADDENDUM>>  [name] went to the ER 02/24/16 c/o nausea, vomiting, & diarrhea assoc w/ some crampy abd pain; nothing acute was  found on exam & eval- given Zofran w/ improvement; she also reported SOB & CXR 02/24/16 showed norm heart size, s/pCABG, hyperexpanded lungs, vague nodular opac in left base & right perihilar areas, otherw clear w/o pneumonia or edema;  She followed up w/ DrBoushka & had a CT Chest done 02/28/16 showing scarring in the apicies, and a dominant spiculated 65mm posterior RLL nodule (prev CTA in 2015 showed it at 69mm), several other tiny opacities noted, no adenopathy;  A subseq PET-CT 03/14/16 confirmed a hypermetabolic RLL nodule & no other hypermet lesions seen;  She had a needle Bx 04/11/16=> pos for squamous cell ca...     She was seen by DrBartle for TSurg but he felt that she was not an operative candidate based on her COPD & severe vascular pathology & suggested SBRT;  She has since been evaluated [facility] for Rad[facility] & she is pending the start of XRT at this time...   FullPFTs 04/10/16 showed FVC=1.53 (57%), FEV1=1.09 (54%), %1sec=71, mid-flows reduced at 42%  predicted; post bronchodil FEV1 improved 17% to 1.28L;  TLC=3.77 (79%), RV=2.07 (96%), RV/TLC=55%;  DLCO=51% predicted and the DL/VA=86% predicted...  ~  June 18, 2016:  69mo ROV w/ SN>  & Zeis returns stating "I'm tired of doctors"- not feeling well, tired, freezing, giving out w/ min activ & ADLs, SOB all the time, etc... I've reviewed the recent Epic data>>    She was eval by DrBartle 04/04/16> not felt to be a surg candidate due to her severe vasc dis & underlying COPD; needle bx of RLL lesion was pos for Squamous Cell Ca=> referred for XRT & seen by DrManning...    She completed her XRT w/ DrManning & was seen 05/28/16>  s/p curative SBRT- 54 Gy in 3 fractions (05/2016) & tol well...    She saw DrHung, GI for anemia w/u w/ EGD/ Colon 05/25/16>  EGD was WNL;  Colon was also WNL...    She was seen in [place] 06/04/16 w/ cough & right sided CP x1wk, note reviewed, CXR/ CT Angio/ LABS- all below; she was not given antibiotic & disch for f/u by  PCP...    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     We reviewed the following updated medical problems during today's office visit >>     COPD, GOLD Stage 2 disease (w/ revers component)>  Back to baseline on her NEB w/ Duoneb Qid (she is only doing it prn "I don't need it"), Dulera100-2spBid, AlbutHFA rescue as needed; Rec to continue same + gradual exercise program & work on wt reduction...    RLL Pulm nodule=> Bx 04/11/16= Squamous cell ca & pt eval by DrBartle, DrMohamed, DrManning w/ SBRT given 05/2016 (54 Gy in 3 fractions)...    Ex-smoker-- quit 2002, 45+pack-year smoking hx    CAP 3/17-- NOS, resolved w/ Levaquin, Doxy, but she claims allergic to everything; she also had bronchitis 05/2016 treated w/ Augmentiun & improved...    Cardiac issues>  HBP, CAD- s/p 3 vessel CABG in 2002, severe ASPVD w/ left CAE 2011, 27mm right MCA aneurysm-- followed by [name], [facility]- DrDickson, IR-DrDeveshwar...    Medical issues>  HBP, HL, borderline diabetes, HH/GERD/ Divertics, colon polyps, HAs (s/p craniotomy for Arnold-Chiari malformation), Anemia-- followed by DrBoushka & DrWillis... EXAM shows Afeb, VSS, O2sat=100% on RA; Wt=173#, 5'2"Tall, BMI=31;  HEENT- neg, mallampati1, bilat CBruits & L-CAE scar;  Chest- decrBS, clear w/o w/r/r;  Heart- CABG scar, RR Gr1-2/6 SEM w/o r/g;  Abd- +epig bruit, w/o masses;  Ext- w/o c/ce;  Neuro- w/o focal deficits...   CXR 06/04/16>  Norm heart size & prior CABG, 1.4cm RLL nodule, no infiltrate or effusion- NAD...   CT Angio Chest 06/04/16>  s/p CABG & diffuse aortic calcif & heavy  coronary calcif, NEG for PE, stable RLL pulm nodule, no adenopathy, chr changes in the lungs bilat...  LABS 05/2016>  CBC- Hg=9.1, mcv=74;  Chems- Cr=1.30;  Fe=27 (7%sat),  Ferritin=8;  B12=546 IMP/PLAN>>  Yari has been thru a lot but improved after SBRT for RLL squamous cell ca, recent Feraheme for anemia, and Augmentin for bronchitis; she  is encouraged to stay on the [employer] Bid & use the NEBS as needed; definitely needs to incr physical activity & get her weight down... we plan rov in 23mo for pulm check...  ~  November 08, 2016:  4-85mo ROV & post hosp visit/ pulmonary follow up>  [name] is followed by PCP- DrBouska & has bee seen 7 times over the interval;  She was HOSP 2/3 - 09/17/16 w/ BRB per rectum assoc w/ mild lower abd pain; prev eval by GI (DrHung) w/ EGD & Colonoscopy 05/2016 done for IDA (both were NEG), capsule endo was rec but not done;  CT Abd & Pelvis was neg for ischemic colitis;  ASA & Plavix was held & she improved- ?etiology (poss divertic bleed)... We reviewed the following medical problems during today's office visit >>     COPD, GOLD Stage 2 disease (w/ revers component)>  Back to baseline on her NEB w/ Duoneb Qid (she is only doing it prn "I don't need it"), Dulera100-2spBid, AlbutHFA rescue as needed; Rec to continue same + gradual exercise program & work on wt reduction... She was seen [facility] 07/14/16 w/ bronchitic exac(dyspnea, cough, r-rib pain), treated w/ Amox & Toradol for pain... She is too sedentary & SOB w/ ADLs...     RLL Pulm nodule=> Bx 04/11/16= Squamous cell ca & pt eval by DrBartle, DrMohamed, DrManning w/ SBRT given 05/2016 (54 Gy in 3 fractions); Last seen by DrMohamed 08/08/16- Stage 1A non-small-cell lung ca, RLL nodule s/p XRT, & persistent anemia=> given Feraheme 06/2016;  CT Chest 07/2016 showed decr size ofnodule from 55mm to 1mm, no sign mets; DrM rec f/u scan in 9mo...    Ex-smoker-- quit 2002, 45+pack-year smoking hx    CAP 3/17-- NOS, resolved w/ Levaquin,  Doxy, but she claims allergic to everything; she also had bronchitis 05/2016 treated w/ Augmentiun & improved...    Cardiac issues>  HBP, CAD- s/p 3 vessel CABG in 2002, severe ASPVD w/ bilat SFA stents 2003 & left CAE 2011, 20mm right MCA aneurysm-- followed by [name], [facility]VS- DrDickson (07/2016), IR-DrDeveshwar... CDoppler 07/2016 showed 40-59% R-ICA stenosis & patent L-CAE site w/o restenosis...    Medical issues>  HBP, HL, borderline diabetes, HH/GERD/ Divertics, colon polyps, HAs (s/p craniotomy for Arnold-Chiari malformation), Anemia-- followed by DrBoushka & DrWillis... EXAM shows Afeb, VSS, O2sat=99% on RA; Wt=168#, 5'2"Tall, BMI=31;  HEENT- neg, mallampati1, bilat CBruits & L-CAE scar;  Chest- decrBS, clear w/o w/r/r;  Heart- CABG scar, RR Gr1-2/6 SEM w/o r/g;  Abd- +epig bruit, w/o masses;  Ext- w/o c/ce;  Neuro- w/o focal deficits...   CXR 07/14/16>  S/p CABG, prev RLL nodule not ell vis, stable biapical scarring, NAD...   CT Angio Chest 07/14/16>  NEG for PE, atherosclerosis of Ao/ coronaries/ great vessels; s/p median sternotomy & CABG; no adenopathy, RLL nodule decr to 70mm (prev 55mm), stable scarring lin lingula & LLL, no new lesions...   CXR 11/08/16 (independently reviewed by me in the PACS system) showed norm heart size, s/p CABG, no adenopathy, stable ill-defined RLL nodular opacity (s/p XRT)...  LABS 11/08/16>  CBC- Hg=12.2, mcv=90, Fe=97 (26%sat), Ferritin=59,  TSH=2.17... IMP/PLAN>>  REC to use her NEB (Duoneb) Tid followed by [id number]- 2spBid and start a regular exercise program; continue follow up w/ her PCP-DrBouska & mult specialty physicians...   ~  March 14, 2017:  35mo ROV & [name] reports that she is "doing just fine"- she had pneumonia x 1 in the interval & wants to blame the  Neb rx and Advair "I don't care what I do, I get pneumonia" and "DrBouska says I'm, his problem child";  It is clear that she is going to REFUSE the NEBS and ADVAIR going forward despite our  conversation to the contrary...  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   CT Chest 01/28/17>  Norm heart size w/ atherosclerosis in coronaries, Ao & branch vessels; no adenopathy; lungs show RLL nodule measures 8x26mm (similar to prev), bilat patchy airsp opac w/ nodular morphology & some tree-in-bud morphology...    She saw PA-Perkins at XRT 01/22/17>  Stage 1A SCCa RLL w/ XRT completed 05/28/16 (54 Gy delivered in 3 fractions of 18Gy each); note reviewed- treated by PCP for left sided pneumonia, and f/u CT w/ stability of the RLL nodule.    She's had mult f/u visits w/ PCP- DrBouska>  Records in Care Everywhere reviewed--  Back pain treated w/ Pred & improved, he presented in June w/ fever to 101, chills, sore throat, deep cough & SOB;  CXR showed patchy LLL opac (confirmed on subseq CT 6/18), and WBC=12.7;  She was treated w/ Augmentin=> later changed to SeptraDS due to ?rash, & improved...   CXR 01/21/17 by [facility], avail for review in PACS system>  Focal patchy infiltrate in LLL medially... We reviewed the following medical problems during today's office visit >>     COPD, GOLD Stage 2 disease (w/ revers component)>  She wants to blame her 6/18 LLL pneumonia on her nebulizer & Advair=> refuses to use these going forward! (the truth is that she refuses meds due to cost);  We discussed using the Duoneb prn and trying ANORO one inhalation daily;  She is still too sedentary & NOT motivated to exercise- needs referral to [place] program & she is encouraged to participate!    RLL Pulm nodule=> Bx 04/11/16= Squamous cell ca  & pt eval by DrBartle, DrMohamed, DrManning w/ SBRT given 05/2016 (54 Gy in 3 fractions); Last seen by DrMohamed 01/2017- Stage 1A non-small-cell lung ca, RLL nodule s/p XRT, & persistent anemia=> given Feraheme 06/2016;  CT Chest f/u 6/18 showed stable size of nodule ~7-41mm, no sign mets; DrM rec f/u scan in 7mo...    Ex-smoker-- quit 2002, 45+pack-year smoking hx    CAP 3/17 & 6/18-- NOS, 1st resolved w/ Levaquin, Doxy, but she claims allergic to everything; 2nd resolved w/ Augmentin, SeptraDS; she also had bronchitis 05/2016 treated w/ Augmentiun & improved...    Cardiac issues>  HBP, CAD- s/p 3 vessel CABG in 2002, severe ASPVD w/ bilat SFA stents 2003 & left CAE 2011, 61mm right MCA aneurysm-- followed by [name], [facility]VS- DrDickson (07/2016), IR-DrDeveshwar... CDoppler 07/2016 showed 40-59% R-ICA stenosis & patent L-CAE site w/o restenosis...    Medical issues>  HBP, HL, borderline diabetes, HH/GERD/ Divertics, colon polyps, HAs (s/p craniotomy for Arnold-Chiari malformation), Anemia-- followed by DrBoushka & DrWillis... EXAM shows Afeb, VSS, O2sat=97% on RA; Wt=163#, 5'2"Tall, BMI=30;  HEENT- neg, mallampati1, bilat CBruits & L-CAE scar;  Chest- decrBS, clear w/o w/r/r;  Heart- CABG scar, RR Gr1-2/6 SEM  w/o r/g;  Abd- +epig bruit, w/o masses;  Ext- w/o c/ce;  Neuro- w/o focal deficits...   LABS 01/2017>  Chems- ok w/ BS=110, Cr=1.3, LFTs wnl;  CBC- Hg=10.8 IMP/PLAN>>  [name] refuses NEBs and Advair-- try ANORO one inhalation daily & encouraged to use the NEB vs rescue inhaler prn;  She desperately need to incr exercise program & can't vs won't do it on her own-- refer to [place] at [facility];  We plan recheck in 4-75mo...   ~  July 15, 2017:  2mo ROV & [name] indicates that "I'm doing good" on Anoro one inhalation daily + Neb vs rescue inhaler as needed;  She notes sl cough, min amt clear sput (no color & no blood), and SOB/DOE the same as before (no change she says); she is still NOT  exercising & claims that Pulm Rehab never called her (and she never called [place] back to tell us so);  She has a f/u appt w/ drMohamed this month w/ CT Chest planned...  We reviewed the following interval notes avail to [place] in [place] Everywhere>      She saw PCP- DrBouska on 04/30/17> medical f/u w/ hx recent GIB- [facility] by [facility] & sched for colonoscopy; hx CAD- s/p CABGx3, HBP, HL; note reviewed- given 2018 Flu vaccine...     She saw GI- DrHung on 05/03/17 for colonoscopy>  Normal colonoscopy- no lesions seen, prev colon done in 2016 showed 4 adenomas...  We reviewed the following medical problems during today's office visit >>     COPD, GOLD Stage 2 disease (w/ revers component)>  She wants to blame her 6/18 LLL pneumonia on her nebulizer & Advair=> refuses to use these going forward;  We discussed using the Duoneb prn and trying ANORO one inhalation daily;  She is still too sedentary & NOT motivated to exercise- needs referral to [place] program & she is encouraged to participate!    RLL Pulm nodule=> Bx 04/11/16= Squamous cell ca & pt eval by DrBartle, DrMohamed, DrManning w/ SBRT given 05/2016 (54 Gy in 3 fractions); Last seen by DrMohamed 01/2017- Stage 1A non-small-cell lung ca, RLL nodule s/p XRT, & persistent anemia=> given Feraheme 06/2016;  CT Chest f/u 6/18 showed stable size of nodule ~7-82mm, no sign mets; DrM rec f/u scan in 77mo...    Ex-smoker-- quit 2002, 45+pack-year smoking hx    CAP 3/17 & 6/18-- NOS, 1st resolved w/ Levaquin, Doxy, but she claims allergic to everything; 2nd resolved w/ Augmentin, SeptraDS; she also had bronchitis 05/2016 treated w/ Augmentiun & improved...    Cardiac issues>  HBP, CAD- s/p 3 vessel CABG in 2002, severe ASPVD w/ bilat SFA stents 2003 & left CAE 2011, 70mm right MCA aneurysm-- followed by [name], VVS- DrDickson (07/2016), IR-DrDeveshwar... On ASA/ Plavix, Azor, Catapres, Aldactone, Crestor; CDoppler 07/2016 showed 40-59% R-ICA stenosis & patent  L-CAE site w/o restenosis...    Medical issues>  HBP, HL, borderline diabetes, HH/GERD (on Dexilant)/ Divertics, colon polyps, HAs (s/p craniotomy for Arnold-Chiari malformation), Anemia-- followed by DrBoushka & DrWillis... EXAM shows Afeb, VSS, O2sat=99% on RA; Wt=157#, 5'2"Tall, BMI=29-30;  HEENT- neg, mallampati1, bilat CBruits & L-CAE scar;  Chest- decrBS, clear w/o w/r/r;  Heart- CABG scar, RR Gr1-2/6 SEM w/o r/g;  Abd- +epig bruit, w/o masses;  Ext- w/o c/ce;  Neuro- w/o focal deficits...  IMP/PLAN>>  [name] is stable fom the pulmonary standpoint; she has not been exercising & is still in need of a regular exercise program-  refer to Pulm Rehab at [facility];  Rec to continue Anoro regularly & AlbuterolHFA as needed...   ~  ADDENDUM>>  CT CHEST 08/22/17>  norm heart size, s/p median sternotomy & CABG, aortic & coronary calcif; no adenopathy, resolved LLL airspace dis, GG attenuation w/ nodularity in RLL has 60mm nodule (prev 30mm)stable partially calcif nodule along R-minor fissure, biapical pleuroparenchymal scarring...      Past Medical History:  Diagnosis Date  . 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 Arnold-Chiari malformation ([place]) 1998  . Asthma   . 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  . Blood in stool    last week of aug 2018  . Carotid artery occlusion   . Complication of anesthesia   . COPD (chronic obstructive pulmonary disease) ([place])   . Coronary artery disease   . 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 Diverticulitis   . Dyspnea    with exertion  . Gastroesophageal reflux disease    occ  . Glaucoma    right eye  . Headache    tension  . Hiatal hernia   . Hyperlipidemia   . Hypertension   . Iliac artery aneurysm ([place])   . Lung cancer ([place]) dx 2018   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  . Myocardial infarction [facility]) 01/01/2000   Cardiac catheterization  . Peripheral vascular disease ([place])    stents in  legs x 2 or 3  . Pneumonia    last time winter 2017 -2018  . PONV (postoperative nausea and vomiting)    occassionally, last colonscopy did ok with anesthesia  . Reflux     Past Surgical History:  Procedure Laterality Date  . ABDOMINAL HYSTERECTOMY    . APPENDECTOMY    . Arnold-chiari malformation repair  1998   Suboccipital craniectomy  . CAROTID ENDARTERECTOMY  03/29/2010   Left  CEA  . CHOLECYSTECTOMY     Gall Bladder  . COLONOSCOPY WITH PROPOFOL N/A 04/22/2015   Procedure: COLONOSCOPY WITH PROPOFOL;  Surgeon: Carol Ada, MD;  Location: [facility];  Service: Endoscopy;  Laterality: N/A;  . COLONOSCOPY WITH PROPOFOL N/A 05/25/2016   Procedure: COLONOSCOPY WITH PROPOFOL;  Surgeon: Carol Ada, MD;  Location: [facility];  Service: Endoscopy;  Laterality: N/A;  . COLONOSCOPY WITH PROPOFOL N/A 05/03/2017   Procedure: COLONOSCOPY WITH PROPOFOL;  Surgeon: Carol Ada, MD;  Location: [facility];  Service: Endoscopy;  Laterality: N/A;  . CORNEAL TRANSPLANT     Right  . CORONARY ARTERY BYPASS GRAFT  01/01/2000   x 3  . ESOPHAGOGASTRODUODENOSCOPY N/A 05/25/2016   Procedure: ESOPHAGOGASTRODUODENOSCOPY (EGD);  Surgeon: Carol Ada, MD;  Location: [facility];  Service: Endoscopy;  Laterality: N/A;  . EYE SURGERY Right 1995 or 1996   Laser surgery for retinal hemorrhage  . IR RADIOLOGIST EVAL & MGMT  12/14/2016  . LEFT HEART CATHETERIZATION WITH CORONARY ANGIOGRAM N/A 08/03/2014   Procedure: LEFT HEART CATHETERIZATION WITH CORONARY ANGIOGRAM;  Surgeon: Birdie Riddle, MD;  Location: [facility];  Service: Cardiovascular;  Laterality: N/A;  . Post Coronary Artery  BPG  01/05/2000   Right jugular sheath removed  . PR VEIN BYPASS GRAFT,AORTO-FEM-POP    . ROTATOR CUFF REPAIR     Right    Outpatient Encounter Medications as of 07/15/2017  Medication Sig  . acetaminophen (TYLENOL) 500 MG tablet Take 1,000 mg by mouth every 6 (six) hours as needed (for pain.).   Marland Kitchen albuterol (VENTOLIN  HFA)  108 (90 BASE) MCG/ACT inhaler Inhale 2 puffs into the lungs every 4 (four) hours as needed for wheezing or shortness of breath (((PLAN B))).  Marland Kitchen amLODipine-olmesartan (AZOR) 5-40 MG tablet Take 1 tablet by mouth daily.  Marland Kitchen aspirin 81 MG chewable tablet Chew 81 mg by mouth daily.  . cetirizine HCl (ZYRTEC) 5 MG/5ML SOLN Take 5 mg by mouth 2 (two) times daily as needed for allergies.  . cloNIDine (CATAPRES) 0.1 MG tablet Take 0.05 mg by mouth 2 (two) times daily.   . clopidogrel (PLAVIX) 75 MG tablet Take 1 tablet (75 mg total) by mouth daily.  Marland Kitchen dexlansoprazole (DEXILANT) 60 MG capsule Take 1 capsule (60 mg total) by mouth daily before breakfast. (Patient taking differently: Take 60 mg by mouth daily as needed (for acid reflux/indigestion.). )  . dicyclomine (BENTYL) 10 MG capsule Take 1 capsule (10 mg total) by mouth 4 (four) times daily -  before meals and at bedtime. (Patient taking differently: Take 10 mg by mouth 4 (four) times daily as needed (for irritable bowels). )  . nitroGLYCERIN (NITROSTAT) 0.4 MG SL tablet Place 0.4 mg under the tongue every 5 (five) minutes as needed for chest pain.   . prednisoLONE acetate (PRED FORTE) 1 % ophthalmic suspension Place 1 drop into the right eye at bedtime as needed (for eye irritation).   . rosuvastatin (CRESTOR) 10 MG tablet Take 10 mg by mouth at bedtime.   Marland Kitchen spironolactone (ALDACTONE) 25 MG tablet Take 25 mg by mouth daily.  Marland Kitchen umeclidinium-vilanterol (ANORO ELLIPTA) 62.5-25 MCG/INH AEPB Inhale 1 puff into the lungs daily.   No facility-administered encounter medications on file as of 07/15/2017.     Allergies  Allergen Reactions  . Hydromorphone Palpitations and Other (See Comments)    DILAUDID  -  Pt had a Heart Attack after taking Dilaudid.  . Levaquin [Levofloxacin] Other (See Comments)    Chest pressure, SOB, "pain in between shoulder blades", sweaty -as reported by patient per experience in ED this afternoon  . Azithromycin     Patient  reported past history of lip swelling  . Codeine Other (See Comments)    Dr. Terrence Dupont advised patient not to take this medication  . Doxycycline Swelling    Mouth, lips, feet swelling  . Oxycodone-Acetaminophen Other (See Comments)    Says it makes her feel weird  . Risedronate Other (See Comments)    Chest pain  . Avelox [Moxifloxacin Hcl In Nacl] Palpitations    Immunization History  Administered Date(s) Administered  . Influenza Split 05/15/2012  . Influenza Whole 05/13/2012  . Influenza, High Dose Seasonal PF 05/22/2013, 07/13/2014  . Influenza,inj,Quad PF,6+ Mos 05/15/2012, 04/14/2015, 05/18/2015, 04/10/2016, 04/30/2017  . Pneumococcal Conjugate-13 05/15/2012  . Pneumococcal Polysaccharide-23 11/30/2014    Current Medications, Allergies, Past Medical History, Past Surgical History, Family History, and Social History were reviewed in [employer] record.   Review of Systems             All symptoms NEG except where BOLDED >>  Constitutional:  F/C/S, fatigue, anorexia, unexpected weight change. HEENT:  HA, visual changes, hearing loss, earache, nasal symptoms, sore throat, mouth sores, hoarseness. Resp:  cough, sputum, hemoptysis; SOB, tightness, wheezing. Cardio:  CP, palpit, DOE, orthopnea, edema. GI:  N/V/D/C, blood in stool; reflux, abd pain, distention, gas. GU:  dysuria, freq, urgency, hematuria, flank pain, voiding difficulty. MS:  joint pain, swelling, tenderness, decr ROM; neck pain, back pain, etc. Neuro:  HA, tremors, seizures, dizziness, syncope,  weakness, numbness, gait abn. Skin:  suspicious lesions or skin rash. Heme:  adenopathy, bruising, bleeding. Psyche:  confusion, agitation, sleep disturbance, hallucinations, anxiety, depression suicidal.   Objective:   Physical Exam       Vital Signs:  Reviewed...   General:  WD, overweight, 76 y/o WF in NAD; alert & oriented; pleasant & cooperative... HEENT:  [name]/AT; Conjunctiva- pink,  Sclera- nonicteric, EOM-wnl, PERRLA, Fundi-benign; EACs-clear, TMs-wnl; NOSE-clear; THROAT-clear & wnl.  Neck:  Supple w/ decr ROM; no JVD; s/p L-CAE, bilat bruits; no thyromegaly or nodules palpated; no lymphadenopathy.  Chest:  decr BS, clear to P & A; without wheezes, rales, or rhonchi heard. Heart:  Regular Rhythm; norm S1 & S2, Gr1-2/6 SEM, w/o rubs or gallops detected. Abdomen:  Soft & nontender- no guarding or rebound; normal bowel sounds; no organomegaly or masses palpated. Ext:  decrROM; without deformities +arthritic changes; no varicose veins, +venous insuffic, no edema;  Pulses intact w/o bruits. Neuro:  No focal neuro deficits, +gait abn... Derm:  No lesions noted; no rash etc. Lymph:  No cervical, supraclavicular, axillary, or inguinal adenopathy palpated.   Assessment:      IMP >>    COPD, GOLD Stage 2 disease>  Back to baseline on her NEB w/ Duoneb Qid, Dulera100-2spBid, AlbutHFA rescue as needed; Rec to continue same + gradual exercise program & work on wt reduction...    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    Ex-smoker-- quit 2002, 45+pack-year smoking hx    CAP 3/17-- NOS, resolved w/ Levaquin, Doxy, but she claims allergic to everything...    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    Medical issues>  HBP, HL, borderline diabetes, HH/GERD/ Divertics, colon polyps, HAs (s/p craniotomy for Arnold-Chiari malformation), Anemia-- followed by DrBoushka & DrWillis...  PLAN >>  10/28/15>   We discussed her COPD and recent hosp for CAP;  She is asked to use the NEBULIZER w/ DUONEB Tid regularly, DULERA100-2spBid, and MUCINEX 600mg  1-2tabs bid w/ fluids... She will maintain regular follow up w/ her numerous physicians and plan pulm recheck in 6-8 wks. 12/12/15>   Eilyn is approaching  her baseline, s/p pneumonia 10/2015;  Asked to continue NEBs Tid-Qid, Dulera Bid, & rescue inhaler as needed;  She will maintain general med follow up w/ DrBouska, Cards w/ DrHarwani, [facility]- DrDickson, and Neuro- DrWillis;  We plan ROV recheck 3 months 06/18/16>  [name] has been thru a lot but improved after SBRT for RLL squamous cell ca, recent Feraheme for anemia, and Augmentin for bronchitis; she is encouraged to stay on the [facility] Bid & use the NEBS as needed; definitely needs to incr physical activity & get her weight down... we plan rov in 79mo for pulm check... 11/08/16>   REC to use her NEB (Duoneb) Tid followed by [id number]- 2spBid and start a regular exercise program; continue follow up w/ her PCP-DrBouska & mult specialty physicians... 03/14/17>   [name] refuses NEBs and Advair-- try ANORO one inhalation daily & encouraged to use the NEB vs rescue inhaler prn;  She desperately need to incr exercise program & can't vs won't do it on her own-- refer to [facility] at [facility];  We plan recheck in 4-78mo... 07/15/17>   [name] is stable fom the pulmonary standpoint; she has not been exercising & is still in  need of a regular exercise program- refer [facility];  Rec to continue Anoro regularly & AlbuterolHFA as needed.   Plan:       Medication List        Accurate as of 07/15/17  3:30 PM. Always use your most recent med list.          acetaminophen 500 MG tablet Commonly known as:  TYLENOL   albuterol 108 (90 Base) MCG/ACT inhaler Commonly known as:  VENTOLIN HFA Inhale 2 puffs into the lungs every 4 (four) hours as needed for wheezing or shortness of breath (((PLAN B))).   amLODipine-olmesartan 5-40 MG tablet Commonly known as:  AZOR   aspirin 81 MG chewable tablet   cetirizine HCl 5 MG/5ML Soln Commonly known as:  Zyrtec   cloNIDine 0.1 MG tablet Commonly known as:  CATAPRES   clopidogrel 75 MG tablet Commonly known as:  PLAVIX Take 1 tablet (75 mg total) by mouth  daily.   dexlansoprazole 60 MG capsule Commonly known as:  DEXILANT Take 1 capsule (60 mg total) by mouth daily before breakfast.   dicyclomine 10 MG capsule Commonly known as:  BENTYL Take 1 capsule (10 mg total) by mouth 4 (four) times daily -  before meals and at bedtime.   nitroGLYCERIN 0.4 MG SL tablet Commonly known as:  NITROSTAT   prednisoLONE acetate 1 % ophthalmic suspension Commonly known as:  PRED FORTE   rosuvastatin 10 MG tablet Commonly known as:  CRESTOR   spironolactone 25 MG tablet Commonly known as:  ALDACTONE   umeclidinium-vilanterol 62.5-25 MCG/INH Aepb Commonly known as:  ANORO ELLIPTA Inhale 1 puff into the lungs daily.

## 2017-07-15 NOTE — Patient Instructions (Signed)
Today we updated your med list in our EPIC system...    Continue your current medications the same...  Continue the [facility] one inhalation daily...    And your rescue inhaler as needed...  We will try again to set up a referral to the [facility] Pulmonary Rehab program...  .stay as atcive as poss...  Call for any questions...  Let's plan a follow up visit in 21mo, sooner if needed for problems.Marland KitchenMarland Kitchen

## 2017-07-24 ENCOUNTER — Telehealth (HOSPITAL_COMMUNITY): Payer: Self-pay

## 2017-07-24 NOTE — Telephone Encounter (Signed)
Called [facility] in regards to order - Juliann Pulse stated she will get message back over to Dr.Nadel to send new order.

## 2017-07-29 ENCOUNTER — Telehealth: Payer: Self-pay | Admitting: Pulmonary Disease

## 2017-07-29 DIAGNOSIS — J449 Chronic obstructive pulmonary disease, unspecified: Secondary | ICD-10-CM

## 2017-07-29 DIAGNOSIS — R5381 Other malaise: Secondary | ICD-10-CM

## 2017-07-29 NOTE — Telephone Encounter (Signed)
A new order will have to be placed for pulmonary rehab. The original order has several diagnosis codes.

## 2017-07-29 NOTE — Telephone Encounter (Signed)
New order placed for pulm rehab with dx of physical deconditioning.

## 2017-07-30 ENCOUNTER — Telehealth: Payer: Self-pay | Admitting: Pulmonary Disease

## 2017-07-30 DIAGNOSIS — R5381 Other malaise: Secondary | ICD-10-CM

## 2017-07-30 NOTE — Telephone Encounter (Signed)
Spoke with [name] and fixed order with correct diagnosis. Nothing further is needed.

## 2017-08-02 ENCOUNTER — Other Ambulatory Visit (HOSPITAL_BASED_OUTPATIENT_CLINIC_OR_DEPARTMENT_OTHER): Payer: Medicare HMO

## 2017-08-02 DIAGNOSIS — C3431 Malignant neoplasm of lower lobe, right bronchus or lung: Secondary | ICD-10-CM

## 2017-08-02 DIAGNOSIS — E875 Hyperkalemia: Secondary | ICD-10-CM

## 2017-08-02 LAB — COMPREHENSIVE METABOLIC PANEL
ALT: 8 U/L (ref 0–55)
ANION GAP: 8 meq/L (ref 3–11)
AST: 14 U/L (ref 5–34)
Albumin: 3.5 g/dL (ref 3.5–5.0)
Alkaline Phosphatase: 66 U/L (ref 40–150)
BUN: 14.4 mg/dL (ref 7.0–26.0)
CHLORIDE: 108 meq/L (ref 98–109)
CO2: 24 meq/L (ref 22–29)
CREATININE: 1.3 mg/dL — AB (ref 0.6–1.1)
Calcium: 9.2 mg/dL (ref 8.4–10.4)
EGFR: 41 mL/min/{1.73_m2} — ABNORMAL LOW (ref >60)
Glucose: 100 mg/dl (ref 70–140)
Potassium: 4.3 mEq/L (ref 3.5–5.1)
Sodium: 139 mEq/L (ref 136–145)
Total Bilirubin: 0.34 mg/dL (ref 0.20–1.20)
Total Protein: 7.1 g/dL (ref 6.4–8.3)

## 2017-08-02 LAB — CBC WITH DIFFERENTIAL/PLATELET
BASO%: 0.3 % (ref 0.0–2.0)
Basophils Absolute: 0 10*3/uL (ref 0.0–0.1)
EOS%: 3.4 % (ref 0.0–7.0)
Eosinophils Absolute: 0.2 10*3/uL (ref 0.0–0.5)
HCT: 29.8 % — ABNORMAL LOW (ref 34.8–46.6)
HGB: 9.9 g/dL — ABNORMAL LOW (ref 11.6–15.9)
LYMPH%: 39.4 % (ref 14.0–49.7)
MCH: 30.7 pg (ref 25.1–34.0)
MCHC: 33.2 g/dL (ref 31.5–36.0)
MCV: 92.3 fL (ref 79.5–101.0)
MONO#: 0.5 10*3/uL (ref 0.1–0.9)
MONO%: 8.2 % (ref 0.0–14.0)
NEUT#: 3.2 10*3/uL (ref 1.5–6.5)
NEUT%: 48.7 % (ref 38.4–76.8)
PLATELETS: 218 10*3/uL (ref 145–400)
RBC: 3.23 10*6/uL — AB (ref 3.70–5.45)
RDW: 14.4 % (ref 11.2–14.5)
WBC: 6.5 10*3/uL (ref 3.9–10.3)
lymph#: 2.6 10*3/uL (ref 0.9–3.3)

## 2017-08-05 ENCOUNTER — Encounter: Payer: Self-pay | Admitting: Oncology

## 2017-08-05 ENCOUNTER — Ambulatory Visit (HOSPITAL_BASED_OUTPATIENT_CLINIC_OR_DEPARTMENT_OTHER): Payer: Medicare HMO | Admitting: Oncology

## 2017-08-05 ENCOUNTER — Telehealth: Payer: Self-pay | Admitting: Internal Medicine

## 2017-08-05 VITALS — BP 143/50 | HR 54 | Temp 97.5°F | Resp 18 | Ht 62.0 in | Wt 156.7 lb

## 2017-08-05 DIAGNOSIS — D649 Anemia, unspecified: Secondary | ICD-10-CM

## 2017-08-05 DIAGNOSIS — C3431 Malignant neoplasm of lower lobe, right bronchus or lung: Secondary | ICD-10-CM

## 2017-08-05 DIAGNOSIS — K7689 Other specified diseases of liver: Secondary | ICD-10-CM

## 2017-08-05 DIAGNOSIS — D500 Iron deficiency anemia secondary to blood loss (chronic): Secondary | ICD-10-CM

## 2017-08-05 NOTE — Assessment & Plan Note (Signed)
This is a very pleasant 76 year old white female with a stage IA non-small cell lung cancer, squamous cell carcinoma presented with right lower lobe pulmonary nodule is status post stereotactic radiotherapy.  The patient was supposed to have a restaging CT scan of the chest prior to this visit, but this was not scheduled.  I have reordered the CT of the chest without contrast per the patient request.  This is to be done within the next 2 weeks.  We will several days after the scan to discuss the results.  Outside CT of the abdomen and pelvis from 07/03/2017 was reviewed.  The patient expressed concern that there was a "nodule" in her liver.  Per the CT scan results, there is a 3 mm cyst in the right lobe of the liver which is stable.  I have explained to the patient that this is a benign finding and no further workup is needed at this time.  The patient is anemic with a hemoglobin of 9.9.  She denies any active bleeding.  Will check iron studies with her next visit in 2 weeks.  She was advised to call immediately if she has any concerning symptoms in the interval. All questions were answered. The patient knows to call the clinic with any problems, questions or concerns. We can certainly see the patient much sooner if necessary.

## 2017-08-05 NOTE — Telephone Encounter (Signed)
Gave avs and calendar for January  °

## 2017-08-05 NOTE — Progress Notes (Signed)
[name] OFFICE PROGRESS NOTE  [name], MD [address] [address] [place] [address]  DIAGNOSIS:  1) stage IA non-small cell lung cancer, squamous cell carcinoma presented with right lower lobe pulmonary nodule. 2) persistent anemia questionable for anemia of chronic disease plus/minus iron deficiency.  PRIOR THERAPY: 1) Feraheme infusion last dose was given 06/20/2016. 2) stereotactic radiotherapy to the right lower lobe lung nodule under the care of Dr. Tammi Klippel.  CURRENT THERAPY: Observation  INTERVAL HISTORY: [name] 76 y.o. female returns for routine follow-up visit accompanied by her daughter and grandson.  The patient is feeling fine and has no specific complaints except for fatigue.  The patient denies fevers and chills.  Denies chest pain, cough, hemoptysis.  She has her baseline shortness of breath.  Denies nausea, vomiting, constipation, diarrhea.  The patient denies any bleeding.  She had a recent colonoscopy in September 2018 which was normal.  The patient reports that she had a CT of the abdomen and pelvis her primary care provider's office earlier this year for complaints of right sided pain.  The patient was was have a CT of the chest prior to this visit, but this was not scheduled.  The patient is here for evaluation and repeat blood work.  MEDICAL HISTORY: Past Medical History:  Diagnosis Date  . 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 Arnold-Chiari malformation ([place]) 1998  . Asthma   . 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  . Blood in stool    last week of aug 2018  . Carotid artery occlusion   . Complication of anesthesia   . COPD (chronic obstructive pulmonary disease) ([place])   . Coronary artery disease   . 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 Diverticulitis   . Dyspnea    with exertion  . Gastroesophageal reflux disease    occ  . Glaucoma    right eye  . Headache     tension  . Hiatal hernia   . Hyperlipidemia   . Hypertension   . Iliac artery aneurysm ([place])   . Lung cancer ([place]) dx 2018   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  . Myocardial infarction [facility]) 01/01/2000   Cardiac catheterization  . Peripheral vascular disease ([place])    stents in legs x 2 or 3  . Pneumonia    last time winter 2017 -2018  . PONV (postoperative nausea and vomiting)    occassionally, last colonscopy did ok with anesthesia  . Reflux     ALLERGIES:  is allergic to hydromorphone; levaquin [levofloxacin]; azithromycin; codeine; doxycycline; oxycodone-acetaminophen; risedronate; and avelox [moxifloxacin hcl in nacl].  MEDICATIONS:  Current Outpatient Medications  Medication Sig Dispense Refill  . acetaminophen (TYLENOL) 500 MG tablet Take 1,000 mg by mouth every 6 (six) hours as needed (for pain.).     Marland Kitchen albuterol (VENTOLIN HFA) 108 (90 BASE) MCG/ACT inhaler Inhale 2 puffs into the lungs every 4 (four) hours as needed for wheezing or shortness of breath (((PLAN B))). 1 Inhaler 11  . amLODipine-olmesartan (AZOR) 5-40 MG tablet Take 1 tablet by mouth daily.    Marland Kitchen aspirin 81 MG chewable tablet Chew 81 mg by mouth daily.    . cetirizine HCl (ZYRTEC) 5 MG/5ML SOLN Take 5 mg by mouth 2 (two) times daily as needed for allergies.    . cloNIDine (CATAPRES) 0.1 MG tablet Take 0.05 mg by mouth 2 (two) times daily.     Marland Kitchen  clopidogrel (PLAVIX) 75 MG tablet Take 1 tablet (75 mg total) by mouth daily. 90 tablet 3  . dexlansoprazole (DEXILANT) 60 MG capsule Take 1 capsule (60 mg total) by mouth daily before breakfast. (Patient taking differently: Take 60 mg by mouth daily as needed (for acid reflux/indigestion.). ) 30 capsule 11  . dicyclomine (BENTYL) 10 MG capsule Take 1 capsule (10 mg total) by mouth 4 (four) times daily -  before meals and at bedtime. (Patient taking differently: Take 10 mg by mouth 4 (four) times daily as needed (for irritable bowels). ) 120 capsule 1  .  nitroGLYCERIN (NITROSTAT) 0.4 MG SL tablet Place 0.4 mg under the tongue every 5 (five) minutes as needed for chest pain.     . prednisoLONE acetate (PRED FORTE) 1 % ophthalmic suspension Place 1 drop into the right eye at bedtime as needed (for eye irritation).     . rosuvastatin (CRESTOR) 10 MG tablet Take 10 mg by mouth at bedtime.     Marland Kitchen spironolactone (ALDACTONE) 25 MG tablet Take 25 mg by mouth daily.    Marland Kitchen umeclidinium-vilanterol (ANORO ELLIPTA) 62.5-25 MCG/INH AEPB Inhale 1 puff into the lungs daily. 3 each 2   No current facility-administered medications for this visit.     SURGICAL HISTORY:  Past Surgical History:  Procedure Laterality Date  . ABDOMINAL HYSTERECTOMY    . APPENDECTOMY    . Arnold-chiari malformation repair  1998   Suboccipital craniectomy  . CAROTID ENDARTERECTOMY  03/29/2010   Left  CEA  . CHOLECYSTECTOMY     Gall Bladder  . COLONOSCOPY WITH PROPOFOL N/A 04/22/2015   Procedure: COLONOSCOPY WITH PROPOFOL;  Surgeon: Carol Ada, MD;  Location: [facility];  Service: Endoscopy;  Laterality: N/A;  . COLONOSCOPY WITH PROPOFOL N/A 05/25/2016   Procedure: COLONOSCOPY WITH PROPOFOL;  Surgeon: Carol Ada, MD;  Location: [facility];  Service: Endoscopy;  Laterality: N/A;  . COLONOSCOPY WITH PROPOFOL N/A 05/03/2017   Procedure: COLONOSCOPY WITH PROPOFOL;  Surgeon: Carol Ada, MD;  Location: [facility];  Service: Endoscopy;  Laterality: N/A;  . CORNEAL TRANSPLANT     Right  . CORONARY ARTERY BYPASS GRAFT  01/01/2000   x 3  . ESOPHAGOGASTRODUODENOSCOPY N/A 05/25/2016   Procedure: ESOPHAGOGASTRODUODENOSCOPY (EGD);  Surgeon: Carol Ada, MD;  Location: [facility];  Service: Endoscopy;  Laterality: N/A;  . EYE SURGERY Right 1995 or 1996   Laser surgery for retinal hemorrhage  . IR RADIOLOGIST EVAL & MGMT  12/14/2016  . LEFT HEART CATHETERIZATION WITH CORONARY ANGIOGRAM N/A 08/03/2014   Procedure: LEFT HEART CATHETERIZATION WITH CORONARY ANGIOGRAM;  Surgeon: Birdie Riddle, MD;  Location: [facility];  Service: Cardiovascular;  Laterality: N/A;  . Post Coronary Artery  BPG  01/05/2000   Right jugular sheath removed  . PR VEIN BYPASS GRAFT,AORTO-FEM-POP    . ROTATOR CUFF REPAIR     Right    REVIEW OF SYSTEMS:   Review of Systems  Constitutional: Negative for appetite change, chills, fever and unexpected weight change. Positive for fatigue. HENT:   Negative for mouth sores, nosebleeds, sore throat and trouble swallowing.   Eyes: Negative for eye problems and icterus.  Respiratory: Negative for cough, hemoptysis, and wheezing.  She has her baseline shortness of breath. Cardiovascular: Negative for chest pain and leg swelling.  Gastrointestinal: Negative for abdominal pain, constipation, diarrhea, nausea and vomiting.  Genitourinary: Negative for bladder incontinence, difficulty urinating, dysuria, frequency and hematuria.   Musculoskeletal: Negative for back pain, gait problem, neck pain and  neck stiffness.  Skin: Negative for itching and rash.  Neurological: Negative for dizziness, extremity weakness, gait problem, headaches, light-headedness and seizures.  Hematological: Negative for adenopathy. Does not bruise/bleed easily.  Psychiatric/Behavioral: Negative for confusion, depression and sleep disturbance. The patient is not nervous/anxious.     PHYSICAL EXAMINATION:  Blood pressure (!) 143/50, pulse (!) 54, temperature (!) 97.5 F (36.4 C), temperature source Oral, resp. rate 18, height 5\' 2"  (1.575 m), weight 156 lb 11.2 oz (71.1 kg), SpO2 100 %.  ECOG PERFORMANCE STATUS: 1 - Symptomatic but completely ambulatory  Physical Exam  Constitutional: Oriented to person, place, and time and well-developed, well-nourished, and in no distress. No distress.  HENT:  Head: Normocephalic and atraumatic.  Mouth/Throat: Oropharynx is clear and moist. No oropharyngeal exudate.  Eyes: Conjunctivae are normal. Right eye exhibits no discharge. Left eye exhibits  no discharge. No scleral icterus.  Neck: Normal range of motion. Neck supple.  Cardiovascular: Normal rate, regular rhythm, normal heart sounds and intact distal pulses.   Pulmonary/Chest: Effort normal and breath sounds normal. No respiratory distress. No wheezes. No rales.  Abdominal: Soft. Bowel sounds are normal. Exhibits no distension and no mass. There is no tenderness.  Musculoskeletal: Normal range of motion. Exhibits no edema.  Lymphadenopathy:    No cervical adenopathy.  Neurological: Alert and oriented to person, place, and time. Exhibits normal muscle tone. Gait normal. Coordination normal.  Skin: Skin is warm and dry. No rash noted. Not diaphoretic. No erythema. No pallor.  Psychiatric: Mood, memory and judgment normal.  Vitals reviewed.  LABORATORY DATA: Lab Results  Component Value Date   WBC 6.5 08/02/2017   HGB 9.9 (L) 08/02/2017   HCT 29.8 (L) 08/02/2017   MCV 92.3 08/02/2017   PLT 218 08/02/2017      Chemistry      Component Value Date/Time   NA 139 08/02/2017 0821   K 4.3 08/02/2017 0821   CL 111 09/17/2016 0420   CO2 24 08/02/2017 0821   BUN 14.4 08/02/2017 0821   CREATININE 1.3 (H) 08/02/2017 0821      Component Value Date/Time   CALCIUM 9.2 08/02/2017 0821   ALKPHOS 66 08/02/2017 0821   AST 14 08/02/2017 0821   ALT 8 08/02/2017 0821   BILITOT 0.34 08/02/2017 0821       RADIOGRAPHIC STUDIES:  No results found.   ASSESSMENT/PLAN:  Primary cancer of right lower lobe of lung ([place]) This is a very pleasant 76 year old white female with a stage IA non-small cell lung cancer, squamous cell carcinoma presented with right lower lobe pulmonary nodule is status post stereotactic radiotherapy.  The patient was supposed to have a restaging CT scan of the chest prior to this visit, but this was not scheduled.  I have reordered the CT of the chest without contrast per the patient request.  This is to be done within the next 2 weeks.  We will several days  after the scan to discuss the results.  Outside CT of the abdomen and pelvis from 07/03/2017 was reviewed.  The patient expressed concern that there was a "nodule" in her liver.  Per the CT scan results, there is a 3 mm cyst in the right lobe of the liver which is stable.  I have explained to the patient that this is a benign finding and no further workup is needed at this time.  The patient is anemic with a hemoglobin of 9.9.  She denies any active bleeding.  Will check  iron studies with her next visit in 2 weeks.  She was advised to call immediately if she has any concerning symptoms in the interval. All questions were answered. The patient knows to call the clinic with any problems, questions or concerns. We can certainly see the patient much sooner if necessary.  Orders Placed This Encounter  Procedures  . CT CHEST WO CONTRAST    Standing Status:   Future    Standing Expiration Date:   08/05/2018    Order Specific Question:   Preferred imaging location?    Answer:   [facility]    Order Specific Question:   Radiology Contrast Protocol - do NOT remove file path    Answer:   [email or web address]    Order Specific Question:   Reason for Exam additional comments    Answer:   Lung cancer. Restaging.  Marland Kitchen CBC with Differential/Platelet    Standing Status:   Future    Standing Expiration Date:   08/05/2018  . Comprehensive metabolic panel    Standing Status:   Future    Standing Expiration Date:   08/05/2018  . Ferritin    Standing Status:   Future    Standing Expiration Date:   08/05/2018  . Iron and TIBC    Standing Status:   Future    Standing Expiration Date:   08/05/2018   Mikey Bussing, DNP, AGPCNP-BC, AOCNP 08/05/17

## 2017-08-09 ENCOUNTER — Telehealth: Payer: Self-pay | Admitting: *Deleted

## 2017-08-09 NOTE — Telephone Encounter (Signed)
Pt called with request for most recent labs be faxed to Dr. Lyla Son office [phone].

## 2017-08-14 ENCOUNTER — Other Ambulatory Visit (HOSPITAL_COMMUNITY): Payer: Medicare HMO

## 2017-08-14 ENCOUNTER — Ambulatory Visit (HOSPITAL_COMMUNITY)
Admission: RE | Admit: 2017-08-14 | Discharge: 2017-08-14 | Disposition: A | Discharge status: Home / Self Care | Payer: Medicare HMO | Source: Ambulatory Visit | Attending: Family | Admitting: Family

## 2017-08-14 ENCOUNTER — Ambulatory Visit (INDEPENDENT_AMBULATORY_CARE_PROVIDER_SITE_OTHER): Payer: Medicare HMO | Admitting: Vascular Surgery

## 2017-08-14 ENCOUNTER — Ambulatory Visit (INDEPENDENT_AMBULATORY_CARE_PROVIDER_SITE_OTHER)
Admission: RE | Admit: 2017-08-14 | Discharge: 2017-08-14 | Disposition: A | Discharge status: Home / Self Care | Payer: Medicare HMO | Source: Ambulatory Visit | Attending: Vascular Surgery | Admitting: Vascular Surgery

## 2017-08-14 ENCOUNTER — Encounter: Payer: Self-pay | Admitting: Vascular Surgery

## 2017-08-14 ENCOUNTER — Other Ambulatory Visit: Payer: Self-pay

## 2017-08-14 ENCOUNTER — Ambulatory Visit (HOSPITAL_COMMUNITY)
Admission: RE | Admit: 2017-08-14 | Discharge: 2017-08-14 | Disposition: A | Discharge status: Home / Self Care | Payer: Medicare HMO | Source: Ambulatory Visit | Attending: Vascular Surgery | Admitting: Vascular Surgery

## 2017-08-14 ENCOUNTER — Telehealth: Payer: Self-pay | Admitting: Medical Oncology

## 2017-08-14 VITALS — BP 138/59 | HR 66 | Temp 96.8°F | Resp 14 | Ht 62.0 in | Wt 153.0 lb

## 2017-08-14 DIAGNOSIS — Z9889 Other specified postprocedural states: Secondary | ICD-10-CM | POA: Diagnosis not present

## 2017-08-14 DIAGNOSIS — Z95828 Presence of other vascular implants and grafts: Secondary | ICD-10-CM | POA: Diagnosis not present

## 2017-08-14 DIAGNOSIS — I6523 Occlusion and stenosis of bilateral carotid arteries: Secondary | ICD-10-CM

## 2017-08-14 DIAGNOSIS — I6521 Occlusion and stenosis of right carotid artery: Secondary | ICD-10-CM | POA: Insufficient documentation

## 2017-08-14 DIAGNOSIS — I771 Stricture of artery: Secondary | ICD-10-CM

## 2017-08-14 LAB — VAS US CAROTID
LCCAPSYS: 118 cm/s
LEFT ECA DIAS: -5 cm/s
LEFT VERTEBRAL DIAS: -16 cm/s
Left CCA dist dias: 16 cm/s
Left CCA dist sys: 84 cm/s
Left CCA prox dias: 17 cm/s
Left ICA dist dias: -31 cm/s
Left ICA dist sys: -152 cm/s
Left ICA prox dias: -30 cm/s
Left ICA prox sys: -119 cm/s
RCCADSYS: -113 cm/s
RCCAPDIAS: 12 cm/s
RIGHT VERTEBRAL DIAS: -14 cm/s
Right CCA prox sys: 101 cm/s

## 2017-08-14 NOTE — Telephone Encounter (Signed)
I returned call and  Left message to call me back.

## 2017-08-14 NOTE — Progress Notes (Signed)
Patient name: [name] MRN: [id number] DOB: 1941-02-18 Sex: female  REASON FOR VISIT:   Follow-up of peripheral vascular disease and bilateral carotid disease.  HPI:   [name] is a pleasant 77 y.o. female who is had previous bilateral iliac artery stents and also a previous left carotid endarterectomy.  The carotid endarterectomy was in August 2011.  [name] comes in for routine follow-up visit.  Since I saw her last, [name] has been diagnosed with a lung cancer which has been treated with radiation therapy.  [name] does describe some pain in her right leg that occurs from the hip down with sitting.  This does not occur with ambulation and I do not think this represents claudication.  [name] does have some chronic back pain and this is likely related to this.  [name] denies any history of rest pain or nonhealing ulcers.  [name] does get occasional headache on the right [name] has had previous brain surgery.  However I do not get any history of stroke, TIAs, expressive or receptive aphasia, or amaurosis fugax.  Past Medical History:  Diagnosis Date  . 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 Asthma   . 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  . Blood in stool    last week of aug 2018  . Carotid artery occlusion   . Complication of anesthesia   . COPD (chronic obstructive pulmonary disease) ([place])   . Coronary artery disease   . 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 Diverticulitis   . Dyspnea    with exertion  . Gastroesophageal reflux disease    occ  . Glaucoma    right eye  . Headache    tension  . Hiatal hernia   . Hyperlipidemia   . Hypertension   . Iliac artery aneurysm ([place])   . Lung cancer ([place]) dx 2018   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  . Myocardial infarction [facility]) 01/01/2000   Cardiac catheterization  . Peripheral vascular disease ([place])    stents in legs x 2 or 3  .  Pneumonia    last time winter 2017 -2018  . PONV (postoperative nausea and vomiting)    occassionally, last colonscopy did ok with anesthesia  . Reflux     Family History  Problem Relation Age of Onset  . Heart disease Mother        Heart Disease before age 45  . Hypertension Mother   . Hyperlipidemia Mother   . Heart attack Mother   . Clotting disorder Mother   . Heart disease Father        Heart Disease before age 78  . Heart attack Father   . Hyperlipidemia Father   . Hypertension Father   . Heart disease Brother        Heart Disease before age 81  . Hyperlipidemia Brother   . Hypertension Brother   . Clotting disorder Brother   . AAA (abdominal aortic aneurysm) Brother   . Cerebral aneurysm Sister   . Hypertension Sister   . AAA (abdominal aortic aneurysm) Sister   . Asthma Sister   . Cerebral aneurysm Brother   . Cancer Brother        Lung  . Hypertension Brother   . Heart attack Brother   . Heart disease Brother        Aneurysm of Brain  . Hypertension Brother   .  Heart disease Brother   . Heart disease Brother   . Stroke Son        Aneurysm of Stomach  . AAA (abdominal aortic aneurysm) Son   . Cancer Maternal Uncle        great uncle/cancer/type unknown    SOCIAL HISTORY: Social History   Tobacco Use  . Smoking status: Former Smoker    Packs/day: 1.50    Years: 30.00    Pack years: 45.00    Types: Cigarettes    Last attempt to quit: 08/13/2000    Years since quitting: 17.0  . Smokeless tobacco: Never Used  Substance Use Topics  . Alcohol use: No    Alcohol/week: 0.0 oz    Allergies  Allergen Reactions  . Hydromorphone Palpitations and Other (See Comments)    DILAUDID  -  Pt had a Heart Attack after taking Dilaudid.  . Levaquin [Levofloxacin] Other (See Comments)    Chest pressure, SOB, "pain in between shoulder blades", sweaty -as reported by patient per experience in ED this afternoon  . Azithromycin     Patient reported past history of lip  swelling  . Codeine Other (See Comments)    Dr. Terrence Dupont advised patient not to take this medication  . Doxycycline Swelling    Mouth, lips, feet swelling  . Oxycodone-Acetaminophen Other (See Comments)    Says it makes her feel weird  . Risedronate Other (See Comments)    Chest pain  . Avelox [Moxifloxacin Hcl In Nacl] Palpitations    Current Outpatient Medications  Medication Sig Dispense Refill  . acetaminophen (TYLENOL) 500 MG tablet Take 1,000 mg by mouth every 6 (six) hours as needed (for pain.).     Marland Kitchen albuterol (VENTOLIN HFA) 108 (90 BASE) MCG/ACT inhaler Inhale 2 puffs into the lungs every 4 (four) hours as needed for wheezing or shortness of breath (((PLAN B))). 1 Inhaler 11  . amLODipine-olmesartan (AZOR) 5-40 MG tablet Take 1 tablet by mouth daily.    Marland Kitchen aspirin 81 MG chewable tablet Chew 81 mg by mouth daily.    . cetirizine HCl (ZYRTEC) 5 MG/5ML SOLN Take 5 mg by mouth 2 (two) times daily as needed for allergies.    . cloNIDine (CATAPRES) 0.1 MG tablet Take 0.05 mg by mouth 2 (two) times daily.     . clopidogrel (PLAVIX) 75 MG tablet Take 1 tablet (75 mg total) by mouth daily. 90 tablet 3  . dexlansoprazole (DEXILANT) 60 MG capsule Take 1 capsule (60 mg total) by mouth daily before breakfast. (Patient taking differently: Take 60 mg by mouth daily as needed (for acid reflux/indigestion.). ) 30 capsule 11  . dicyclomine (BENTYL) 10 MG capsule Take 1 capsule (10 mg total) by mouth 4 (four) times daily -  before meals and at bedtime. (Patient taking differently: Take 10 mg by mouth 4 (four) times daily as needed (for irritable bowels). ) 120 capsule 1  . nitroGLYCERIN (NITROSTAT) 0.4 MG SL tablet Place 0.4 mg under the tongue every 5 (five) minutes as needed for chest pain.     . prednisoLONE acetate (PRED FORTE) 1 % ophthalmic suspension Place 1 drop into the right eye at bedtime as needed (for eye irritation).     . rosuvastatin (CRESTOR) 10 MG tablet Take 10 mg by mouth at bedtime.      Marland Kitchen spironolactone (ALDACTONE) 25 MG tablet Take 25 mg by mouth daily.    Marland Kitchen umeclidinium-vilanterol (ANORO ELLIPTA) 62.5-25 MCG/INH AEPB Inhale 1 puff into the lungs daily.  3 each 2   No current facility-administered medications for this visit.     REVIEW OF SYSTEMS:  [X]  denotes positive finding, [ ]  denotes negative finding Cardiac  Comments:  Chest pain or chest pressure:    Shortness of breath upon exertion:    Short of breath when lying flat:    Irregular heart rhythm:        Vascular    Pain in calf, thigh, or hip brought on by ambulation:    Pain in feet at night that wakes you up from your sleep:     Blood clot in your veins:    Leg swelling:         Pulmonary    Oxygen at home:    Productive cough:     Wheezing:         Neurologic    Sudden weakness in arms or legs:     Sudden numbness in arms or legs:     Sudden onset of difficulty speaking or slurred speech:    Temporary loss of vision in one eye:     Problems with dizziness:         Gastrointestinal    Blood in stool:     Vomited blood:         Genitourinary    Burning when urinating:     Blood in urine:        Psychiatric    Major depression:         Hematologic    Bleeding problems:    Problems with blood clotting too easily:        Skin    Rashes or ulcers:        Constitutional    Fever or chills:     PHYSICAL EXAM:   Vitals:   08/14/17 1059 08/14/17 1105 08/14/17 1110  BP: (!) 112/48 (!) 119/50 (!) 138/59  Pulse: 62 66 66  Resp: 14    Temp: (!) 96.8 F (36 C)    TempSrc: Oral    Weight: 153 lb (69.4 kg)    Height: 5\' 2"  (1.575 m)      GENERAL: The patient is a well-nourished female, in no acute distress. The vital signs are documented above. CARDIAC: There is a regular rate and rhythm.  VASCULAR: I do not detect carotid bruits. [name] has palpable femoral, popliteal, and dorsalis pedis pulses bilaterally. PULMONARY: There is good air exchange bilaterally without wheezing or  rales. ABDOMEN: Soft and non-tender with normal pitched bowel sounds.  MUSCULOSKELETAL: There are no major deformities or cyanosis. NEUROLOGIC: No focal weakness or paresthesias are detected. SKIN: There are no ulcers or rashes noted. PSYCHIATRIC: The patient has a normal affect.  DATA:    ILIAC STENT DUPLEX: I have independently interpreted her duplex scan.  [name] has had bilateral common iliac artery stents and a left external iliac artery stent. There are biphasic Doppler signals throughout these areas.  There is some stenosis proximal to the external iliac artery stent on the left.  ARTERIAL DOPPLER STUDY: I have independently interpreted her arterial Doppler study today.  On the right side there is a triphasic posterior tibial signal with a biphasic dorsalis pedis signal.  ABIs 95%.  On the left side there is a triphasic dorsalis pedis and posterior tibial signal.  ABIs 100%.  CAROTID DUPLEX: I have independently interpreted her carotid duplex scan today.  On the left side, the left carotid endarterectomy site is widely patent.  On the right side,  the stenosis has progressed to 60-79%.  This was previously 40-59% 1 year ago.   MEDICAL ISSUES:   ASYMPTOMATIC 60-79% RIGHT CAROTID STENOSIS: [name] understands we would not consider carotid endarterectomy unless the stenosis progressed to greater than 80%.  I have ordered a follow-up carotid duplex scan in 6 months and I will see her back at that time.   PERIPHERAL VASCULAR DISEASE: Her iliac stents are patent and [name] has palpable pedal pulses.  I have encouraged her to stay as active as possible.  I have ordered follow-up ABIs in 6 months and a duplex of her stents in 1 year.  Deitra Mayo [facility] [phone]

## 2017-08-14 NOTE — Progress Notes (Signed)
Vitals:   08/14/17 1059  BP: (!) 112/48  Pulse: 62  Resp: 14  Temp: (!) 96.8 F (36 C)  TempSrc: Oral  Weight: 153 lb (69.4 kg)  Height: 5\' 2"  (1.575 m)

## 2017-08-15 NOTE — Addendum Note (Signed)
Addended by: Lianne Cure A on: 08/15/2017 12:49 PM   Modules accepted: Orders

## 2017-08-19 ENCOUNTER — Telehealth (HOSPITAL_COMMUNITY): Payer: Self-pay

## 2017-08-19 NOTE — Telephone Encounter (Signed)
Patients insurance is active and benefits verified through [employer] - $30.00 co-pay, no deductible, out of pocket amount of $4,200/$0.00 has been met, no co-insurance, and no pre-authorization is required. Reference #[id number] ° °Patient will be contacted and scheduled. °

## 2017-08-22 ENCOUNTER — Ambulatory Visit (HOSPITAL_COMMUNITY)
Admission: RE | Admit: 2017-08-22 | Discharge: 2017-08-22 | Disposition: A | Discharge status: Home / Self Care | Payer: Medicare HMO | Source: Ambulatory Visit | Attending: Internal Medicine | Admitting: Internal Medicine

## 2017-08-22 DIAGNOSIS — E875 Hyperkalemia: Secondary | ICD-10-CM | POA: Insufficient documentation

## 2017-08-22 DIAGNOSIS — I700 Atherosclerosis of aorta: Secondary | ICD-10-CM | POA: Diagnosis not present

## 2017-08-22 DIAGNOSIS — J181 Lobar pneumonia, unspecified organism: Secondary | ICD-10-CM | POA: Insufficient documentation

## 2017-08-22 DIAGNOSIS — C3431 Malignant neoplasm of lower lobe, right bronchus or lung: Secondary | ICD-10-CM | POA: Diagnosis not present

## 2017-08-22 MED ORDER — IOPAMIDOL (ISOVUE-300) INJECTION 61%
INTRAVENOUS | Status: AC
Start: 2017-08-22 — End: 2017-08-22
  Filled 2017-08-22: qty 75

## 2017-08-22 MED ORDER — IOPAMIDOL (ISOVUE-300) INJECTION 61%
75.0000 mL | Freq: Once | INTRAVENOUS | Status: AC | PRN
  Administered 2017-08-22: 60 mL via INTRAVENOUS

## 2017-08-26 ENCOUNTER — Telehealth: Payer: Self-pay | Admitting: Internal Medicine

## 2017-08-26 ENCOUNTER — Encounter: Payer: Self-pay | Admitting: Internal Medicine

## 2017-08-26 ENCOUNTER — Inpatient Hospital Stay: Payer: Medicare HMO

## 2017-08-26 ENCOUNTER — Inpatient Hospital Stay: Payer: Medicare HMO | Attending: Internal Medicine | Admitting: Internal Medicine

## 2017-08-26 DIAGNOSIS — I2510 Atherosclerotic heart disease of native coronary artery without angina pectoris: Secondary | ICD-10-CM | POA: Diagnosis not present

## 2017-08-26 DIAGNOSIS — C3490 Malignant neoplasm of unspecified part of unspecified bronchus or lung: Secondary | ICD-10-CM

## 2017-08-26 DIAGNOSIS — D500 Iron deficiency anemia secondary to blood loss (chronic): Secondary | ICD-10-CM

## 2017-08-26 DIAGNOSIS — Z7982 Long term (current) use of aspirin: Secondary | ICD-10-CM | POA: Diagnosis not present

## 2017-08-26 DIAGNOSIS — H409 Unspecified glaucoma: Secondary | ICD-10-CM

## 2017-08-26 DIAGNOSIS — J449 Chronic obstructive pulmonary disease, unspecified: Secondary | ICD-10-CM | POA: Diagnosis not present

## 2017-08-26 DIAGNOSIS — E785 Hyperlipidemia, unspecified: Secondary | ICD-10-CM | POA: Insufficient documentation

## 2017-08-26 DIAGNOSIS — Z79899 Other long term (current) drug therapy: Secondary | ICD-10-CM | POA: Insufficient documentation

## 2017-08-26 DIAGNOSIS — K219 Gastro-esophageal reflux disease without esophagitis: Secondary | ICD-10-CM | POA: Diagnosis not present

## 2017-08-26 DIAGNOSIS — R067 Sneezing: Secondary | ICD-10-CM | POA: Diagnosis not present

## 2017-08-26 DIAGNOSIS — D649 Anemia, unspecified: Secondary | ICD-10-CM | POA: Insufficient documentation

## 2017-08-26 DIAGNOSIS — Z923 Personal history of irradiation: Secondary | ICD-10-CM | POA: Insufficient documentation

## 2017-08-26 DIAGNOSIS — I252 Old myocardial infarction: Secondary | ICD-10-CM | POA: Diagnosis not present

## 2017-08-26 DIAGNOSIS — I739 Peripheral vascular disease, unspecified: Secondary | ICD-10-CM | POA: Diagnosis not present

## 2017-08-26 DIAGNOSIS — Z885 Allergy status to narcotic agent status: Secondary | ICD-10-CM | POA: Insufficient documentation

## 2017-08-26 DIAGNOSIS — C3431 Malignant neoplasm of lower lobe, right bronchus or lung: Secondary | ICD-10-CM | POA: Diagnosis not present

## 2017-08-26 LAB — COMPREHENSIVE METABOLIC PANEL
ALBUMIN: 3.7 g/dL (ref 3.5–5.0)
ALK PHOS: 66 U/L (ref 40–150)
ALT: 9 U/L (ref 0–55)
AST: 15 U/L (ref 5–34)
Anion gap: 9 (ref 3–11)
BILIRUBIN TOTAL: 0.4 mg/dL (ref 0.2–1.2)
BUN: 19 mg/dL (ref 7–26)
CALCIUM: 9.9 mg/dL (ref 8.4–10.4)
CO2: 23 mmol/L (ref 22–29)
CREATININE: 1.41 mg/dL — AB (ref 0.60–1.10)
Chloride: 110 mmol/L — ABNORMAL HIGH (ref 98–109)
GFR calc Af Amer: 41 mL/min — ABNORMAL LOW (ref >60)
GFR calc non Af Amer: 36 mL/min — ABNORMAL LOW (ref >60)
GLUCOSE: 103 mg/dL (ref 70–140)
Potassium: 5.2 mmol/L — ABNORMAL HIGH (ref 3.3–4.7)
SODIUM: 142 mmol/L (ref 136–145)
Total Protein: 7.3 g/dL (ref 6.4–8.3)

## 2017-08-26 LAB — CBC WITH DIFFERENTIAL/PLATELET
Basophils Absolute: 0 10*3/uL (ref 0.0–0.1)
Basophils Relative: 0 %
EOS ABS: 0.3 10*3/uL (ref 0.0–0.5)
Eosinophils Relative: 4 %
HEMATOCRIT: 31.5 % — AB (ref 34.8–46.6)
HEMOGLOBIN: 10.4 g/dL — AB (ref 11.6–15.9)
LYMPHS ABS: 2.3 10*3/uL (ref 0.9–3.3)
Lymphocytes Relative: 33 %
MCH: 30.8 pg (ref 25.1–34.0)
MCHC: 33 g/dL (ref 31.5–36.0)
MCV: 93.2 fL (ref 79.5–101.0)
Monocytes Absolute: 0.6 10*3/uL (ref 0.1–0.9)
Monocytes Relative: 9 %
NEUTROS ABS: 3.6 10*3/uL (ref 1.5–6.5)
NEUTROS PCT: 54 %
Platelets: 249 10*3/uL (ref 145–400)
RBC: 3.38 MIL/uL — AB (ref 3.70–5.45)
RDW: 14 % (ref 11.2–16.1)
WBC: 6.9 10*3/uL (ref 3.9–10.3)

## 2017-08-26 LAB — IRON AND TIBC
Iron: 57 ug/dL (ref 41–142)
Saturation Ratios: 19 % — ABNORMAL LOW (ref 21–57)
TIBC: 308 ug/dL (ref 236–444)
UIBC: 250 ug/dL

## 2017-08-26 LAB — FERRITIN: Ferritin: 38 ng/mL (ref 9–269)

## 2017-08-26 NOTE — Telephone Encounter (Signed)
Scheduled appt per 1/14 los - Gave patient AVS and calender per los.  

## 2017-08-26 NOTE — Progress Notes (Signed)
[facility] [phone]   [phone]  OFFICE PROGRESS NOTE  [name], MD [address] [place] [address]  DIAGNOSIS:  1) stage IA non-small cell lung cancer, squamous cell carcinoma presented with right lower lobe pulmonary nodule. 2) persistent anemia questionable for anemia of chronic disease plus/minus iron deficiency.  PRIOR THERAPY:  1) Feraheme infusion last dose was given 06/20/2016. 2) stereotactic radiotherapy to the right lower lobe lung nodule under the care of Dr. Tammi Klippel.  CURRENT THERAPY: Observation.  INTERVAL HISTORY: [name] 77 y.o. female returns to the clinic today for follow-up visit.  The patient has no complaints today except for sneezing.  She denied having any cough or hemoptysis.  She continues to have shortness of breath with exertion.  She has no weight loss or night sweats.  She has no nausea, vomiting, diarrhea or constipation.  The patient came to the clinic today for evaluation with repeat CT scan of the chest.  MEDICAL HISTORY: Past Medical History:  Diagnosis Date  . 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 Asthma   . 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  . Blood in stool    last week of aug 2018  . Carotid artery occlusion   . Complication of anesthesia   . COPD (chronic obstructive pulmonary disease) ([place])   . Coronary artery disease   . 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 Diverticulitis   . Dyspnea    with exertion  . Gastroesophageal reflux disease    occ  . Glaucoma    right eye  . Headache    tension  . Hiatal hernia   . Hyperlipidemia   . Hypertension   . Iliac artery aneurysm ([place])   . 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 Myocardial infarction [facility]) 01/01/2000   Cardiac catheterization  . Peripheral vascular disease ([place])      stents in legs x 2 or 3  . Pneumonia    last time winter 2017 -2018  . PONV (postoperative nausea and vomiting)    occassionally, last colonscopy did ok with anesthesia  . Reflux     ALLERGIES:  is allergic to hydromorphone; levaquin [levofloxacin]; azithromycin; codeine; doxycycline; oxycodone-acetaminophen; risedronate; and avelox [moxifloxacin hcl in nacl].  MEDICATIONS:  Current Outpatient Medications  Medication Sig Dispense Refill  . acetaminophen (TYLENOL) 500 MG tablet Take 1,000 mg by mouth every 6 (six) hours as needed (for pain.).     Marland Kitchen albuterol (VENTOLIN HFA) 108 (90 BASE) MCG/ACT inhaler Inhale 2 puffs into the lungs every 4 (four) hours as needed for wheezing or shortness of breath (((PLAN B))). 1 Inhaler 11  . amLODipine-olmesartan (AZOR) 5-40 MG tablet Take 1 tablet by mouth daily.    Marland Kitchen aspirin 81 MG chewable tablet Chew 81 mg by mouth daily.    . cetirizine HCl (ZYRTEC) 5 MG/5ML SOLN Take 5 mg by mouth 2 (two) times daily as needed for allergies.    . cloNIDine (CATAPRES) 0.1 MG tablet Take 0.05 mg by mouth 2 (two) times daily.     . clopidogrel (PLAVIX) 75 MG tablet Take 1 tablet (75 mg total) by mouth daily. 90 tablet 3  . dexlansoprazole (DEXILANT) 60 MG capsule Take 1 capsule (60 mg total) by mouth daily before breakfast. (Patient taking differently: Take 60 mg  by mouth daily as needed (for acid reflux/indigestion.). ) 30 capsule 11  . dicyclomine (BENTYL) 10 MG capsule Take 1 capsule (10 mg total) by mouth 4 (four) times daily -  before meals and at bedtime. (Patient taking differently: Take 10 mg by mouth 4 (four) times daily as needed (for irritable bowels). ) 120 capsule 1  . nitroGLYCERIN (NITROSTAT) 0.4 MG SL tablet Place 0.4 mg under the tongue every 5 (five) minutes as needed for chest pain.     . prednisoLONE acetate (PRED FORTE) 1 % ophthalmic suspension Place 1 drop into the right eye at bedtime as needed (for eye irritation).     . rosuvastatin (CRESTOR)  10 MG tablet Take 10 mg by mouth at bedtime.     Marland Kitchen spironolactone (ALDACTONE) 25 MG tablet Take 25 mg by mouth daily.    Marland Kitchen umeclidinium-vilanterol (ANORO ELLIPTA) 62.5-25 MCG/INH AEPB Inhale 1 puff into the lungs daily. 3 each 2   No current facility-administered medications for this visit.     SURGICAL HISTORY:  Past Surgical History:  Procedure Laterality Date  . ABDOMINAL HYSTERECTOMY    . APPENDECTOMY    . Arnold-chiari malformation repair  1998   Suboccipital craniectomy  . CAROTID ENDARTERECTOMY  03/29/2010   Left  CEA  . CHOLECYSTECTOMY     Gall Bladder  . COLONOSCOPY WITH PROPOFOL N/A 04/22/2015   Procedure: COLONOSCOPY WITH PROPOFOL;  Surgeon: Carol Ada, MD;  Location: [facility];  Service: Endoscopy;  Laterality: N/A;  . COLONOSCOPY WITH PROPOFOL N/A 05/25/2016   Procedure: COLONOSCOPY WITH PROPOFOL;  Surgeon: Carol Ada, MD;  Location: [facility];  Service: Endoscopy;  Laterality: N/A;  . COLONOSCOPY WITH PROPOFOL N/A 05/03/2017   Procedure: COLONOSCOPY WITH PROPOFOL;  Surgeon: Carol Ada, MD;  Location: [facility];  Service: Endoscopy;  Laterality: N/A;  . CORNEAL TRANSPLANT     Right  . CORONARY ARTERY BYPASS GRAFT  01/01/2000   x 3  . ESOPHAGOGASTRODUODENOSCOPY N/A 05/25/2016   Procedure: ESOPHAGOGASTRODUODENOSCOPY (EGD);  Surgeon: Carol Ada, MD;  Location: [facility];  Service: Endoscopy;  Laterality: N/A;  . EYE SURGERY Right 1995 or 1996   Laser surgery for retinal hemorrhage  . IR RADIOLOGIST EVAL & MGMT  12/14/2016  . LEFT HEART CATHETERIZATION WITH CORONARY ANGIOGRAM N/A 08/03/2014   Procedure: LEFT HEART CATHETERIZATION WITH CORONARY ANGIOGRAM;  Surgeon: Birdie Riddle, MD;  Location: [facility] CATH LAB;  Service: Cardiovascular;  Laterality: N/A;  . Post Coronary Artery  BPG  01/05/2000   Right jugular sheath removed  . PR VEIN BYPASS GRAFT,AORTO-FEM-POP    . ROTATOR CUFF REPAIR     Right    REVIEW OF SYSTEMS:  A comprehensive review of systems was  negative except for: Ears, nose, mouth, throat, and face: positive for nasal congestion Respiratory: positive for cough and dyspnea on exertion   PHYSICAL EXAMINATION: General appearance: alert, cooperative and no distress Head: Normocephalic, without obvious abnormality, atraumatic Neck: no adenopathy, no JVD, supple, symmetrical, trachea midline and thyroid not enlarged, symmetric, no tenderness/mass/nodules Lymph nodes: Cervical, supraclavicular, and axillary nodes normal. Resp: wheezes bilaterally Back: symmetric, no curvature. ROM normal. No CVA tenderness. Cardio: regular rate and rhythm, S1, S2 normal, no murmur, click, rub or gallop GI: soft, non-tender; bowel sounds normal; no masses,  no organomegaly Extremities: extremities normal, atraumatic, no cyanosis or edema  ECOG PERFORMANCE STATUS: 1 - Symptomatic but completely ambulatory  Blood pressure (!) 139/53, pulse (!) 59, temperature 97.7 F (36.5 C), temperature source Oral, resp. rate  18, height 5\' 2"  (1.575 m), weight 155 lb 6.4 oz (70.5 kg), SpO2 98 %.  LABORATORY DATA: Lab Results  Component Value Date   WBC 6.9 08/26/2017   HGB 10.4 (L) 08/26/2017   HCT 31.5 (L) 08/26/2017   MCV 93.2 08/26/2017   PLT 249 08/26/2017      Chemistry      Component Value Date/Time   NA 139 08/02/2017 0821   K 4.3 08/02/2017 0821   CL 111 09/17/2016 0420   CO2 24 08/02/2017 0821   BUN 14.4 08/02/2017 0821   CREATININE 1.3 (H) 08/02/2017 0821      Component Value Date/Time   CALCIUM 9.2 08/02/2017 0821   ALKPHOS 66 08/02/2017 0821   AST 14 08/02/2017 0821   ALT 8 08/02/2017 0821   BILITOT 0.34 08/02/2017 0821       RADIOGRAPHIC STUDIES: Ct Chest W Contrast  Result Date: 08/22/2017 CLINICAL DATA:  Lung cancer follow-up. EXAM: CT CHEST WITH CONTRAST TECHNIQUE: Multidetector CT imaging of the chest was performed during intravenous contrast administration. CONTRAST:  56mL ISOVUE-300 IOPAMIDOL (ISOVUE-300) INJECTION 61%  COMPARISON:  01/28/2017 FINDINGS: Cardiovascular: Normal heart size. No pericardial effusion. Previous median sternotomy and CABG procedure. Calcification in the thoracic aorta noted. Native coronary artery calcifications also identified. Mediastinum/Nodes: No enlarged mediastinal, hilar, or axillary lymph nodes. Thyroid gland, trachea, and esophagus demonstrate no significant findings. Lungs/Pleura: No pleural effusion. Near complete resolution of left lower lobe airspace consolidation. Geographic area of ground-glass attenuation with nodularity within the posterior right lower lobe is again identified. The central solid index nodule within this area measures 5 mm, image 80 of series 5. Previously 8 mm. Stable partially calcified nodule along the right minor fissure, image 65 of series 5. Biapical pleuroparenchymal scarring is again noted. Upper Abdomen: No acute abnormality. Previous cholecystectomy. Aortic atherosclerosis is identified including disease within the superior mesenteric artery Musculoskeletal: Degenerative disc disease identified within the spine. No aggressive lytic or sclerotic bone lesions. IMPRESSION: 1. Slight decrease in size of index right lower lobe pulmonary nodule. 2. Near complete clearing of left lower lobe pneumonia. 3. No thoracic adenopathy. 4.  Aortic Atherosclerosis (ICD10-I70.0). Electronically Signed   By: Kerby Moors M.D.   On: 08/22/2017 10:22    ASSESSMENT AND PLAN:  This is a very pleasant 77 years old white female with a stage IA non-small cell lung cancer, squamous cell carcinoma presented with right lower lobe pulmonary nodule is status post stereotactic radiotherapy.  The patient is feeling fine today except for chest congestion.  Her CT scan of the chest showed no evidence for disease recurrence.  I discussed the scan results with the patient and recommended for her to continue on observation with repeat CT scan of the chest in 6 months. She was advised to call  immediately if she has any concerning symptoms in the interval. All questions were answered. The patient knows to call the clinic with any problems, questions or concerns. We can certainly see the patient much sooner if necessary. I spent 10 minutes counseling the patient face to face. The total time spent in the appointment was 15 minutes.  Disclaimer: This note was dictated with voice recognition software. Similar sounding words can inadvertently be transcribed and may not be corrected upon review.

## 2017-09-04 ENCOUNTER — Telehealth: Payer: Self-pay | Admitting: Pulmonary Disease

## 2017-09-05 NOTE — Telephone Encounter (Signed)
Doesn't it usually take some time for them to call for pulm rehab? PCC's please advise.

## 2017-09-05 NOTE — Telephone Encounter (Signed)
Patient calling back - advised referral has already been sent - pt states she has called pulmonary rehab and they haven't returned her call. Patient asking if we can call them to get this started -Patient can be reached at [phone]

## 2017-09-05 NOTE — Telephone Encounter (Signed)
Pt is calling back [phone]

## 2017-09-05 NOTE — Telephone Encounter (Signed)
Left voice mail on machine for patient to return phone call back regarding pulm rehab referral. X1

## 2017-09-05 NOTE — Telephone Encounter (Signed)
lmtcb for pt. pulm rehab order was placed on 07/30/17.

## 2017-09-06 NOTE — Telephone Encounter (Signed)
Called Pulm Rehab & spoke to [name].  She states their classes are full right now but she has a stack of people she is planning to call today & patient is in her stack.  I called pt & told her [name] should be calling her today.  Nothing further needed.

## 2017-09-12 ENCOUNTER — Telehealth (HOSPITAL_COMMUNITY): Payer: Self-pay

## 2017-09-12 NOTE — Telephone Encounter (Signed)
Attempted to call patient in regards to Cardiac Rehab - Lm on vm °

## 2017-09-19 ENCOUNTER — Telehealth (HOSPITAL_COMMUNITY): Payer: Self-pay

## 2017-09-19 NOTE — Telephone Encounter (Signed)
Called to follow up with patient in regards to Pulmonary Rehab - Patient is interested in the program. Scheduled orientation on 09/27/2017 at 9:30am. Patient will attend the 10:30am exc class.

## 2017-09-27 ENCOUNTER — Ambulatory Visit (HOSPITAL_COMMUNITY): Payer: Medicare HMO

## 2017-10-14 ENCOUNTER — Encounter (HOSPITAL_COMMUNITY)
Admission: RE | Admit: 2017-10-14 | Discharge: 2017-10-14 | Disposition: A | Discharge status: Home / Self Care | Payer: Medicare HMO | Source: Ambulatory Visit | Attending: Pulmonary Disease | Admitting: Pulmonary Disease

## 2017-10-14 NOTE — Progress Notes (Signed)
Pulmonary Rehab Orientation Note: Patient arrived to pulmonary rehab orientation 1 1/2 hours early. She stated she had to "catch a ride" with her son who had a MD appointment across the street. Patient upset that volunteer services gave her "incorrect" directions to the department and she had been "walking the hospital forever". Pulmonary rehab RN apologized and patient accepted. As RN reviewed patients financial responsibility for pulmonary rehab (which was previously done over the phone when patient's appointment was made) patient said she could not afford 30.00/copay per month. She "assumed" Medicaid would pay for what [employer] did not cover. Informed patient that Medicaid does not cover the cost of pulmonary rehab. Patient decided to cancel orientation appointment and will call back to reschedule once she talks with son to make sure she can afford the co-pay. If patient has not called back in 2 weeks will follow up.

## 2017-10-18 IMAGING — CT CT CHEST W/ CM
2 of 4 series · 15 of 36 positions shown, 18 images · IV contrast (iopamidol)
Comparison: 01/21/2017 chest radiograph.  CT of 07/14/2016.

CLINICAL DATA: Right lower lobe lung cancer.  Restaging.

EXAM:
CT CHEST WITH CONTRAST
TECHNIQUE: Multidetector CT imaging of the chest was performed during
intravenous contrast administration.
CONTRAST:  <See Chart> [id number] IOPAMIDOL ([id number]) INJECTION
61%

[Series 2: axial st · axial · 0.71mm/px · z∈[-106,+146]mm · 12 of 148 slices shown, 15 images]
[im 11/148  mediastinal]
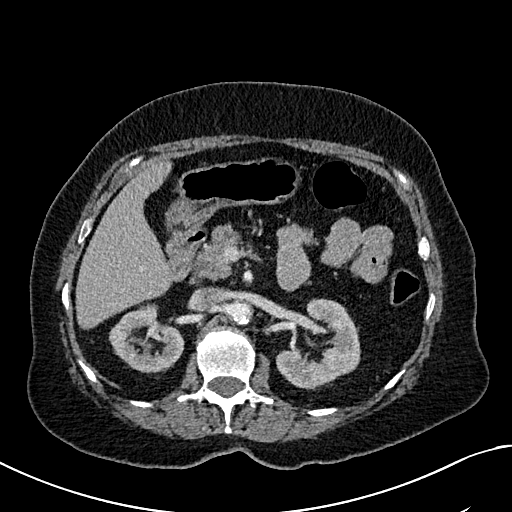
[im 11/148  lung]
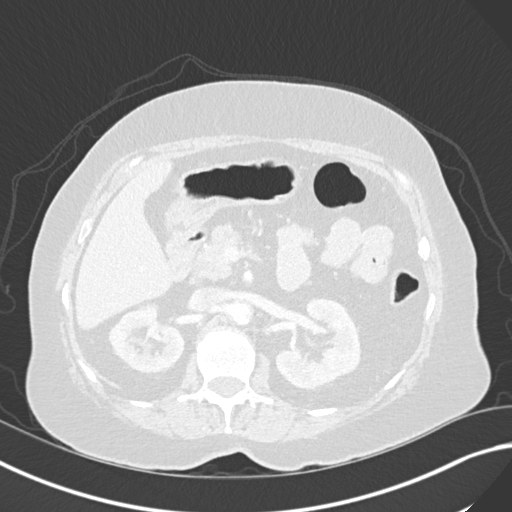
[im 22/148  lung]
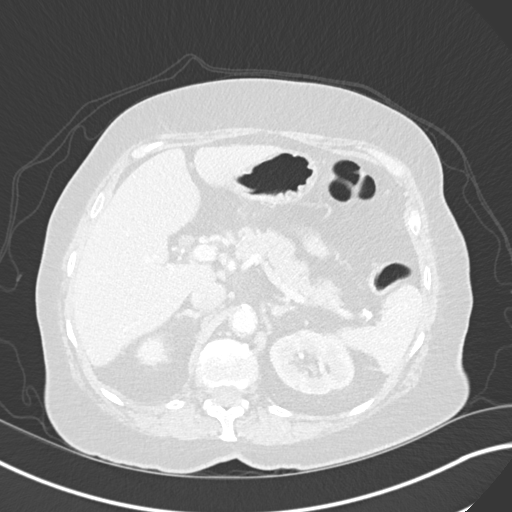
[im 32/148  lung]
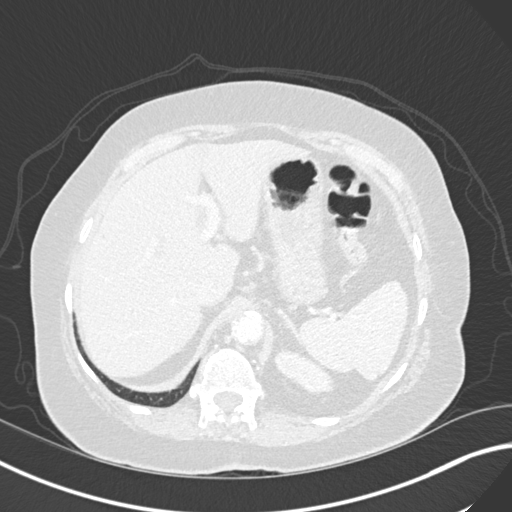
[im 43/148  lung]
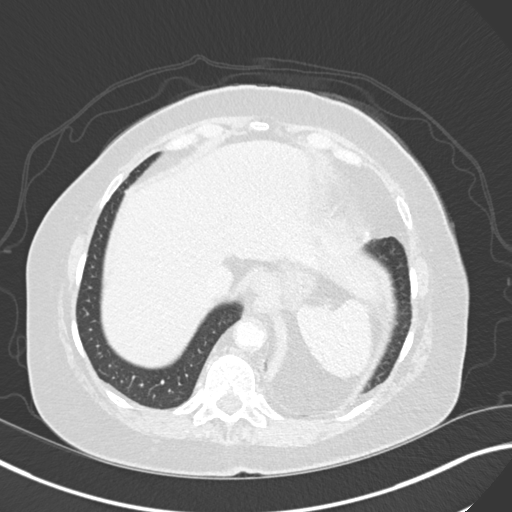
[im 53/148  mediastinal]
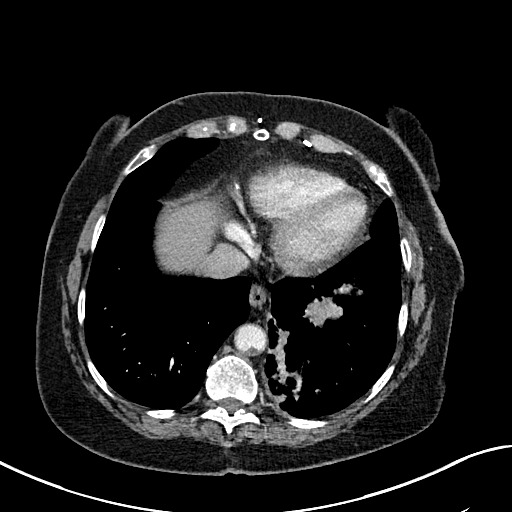
[im 53/148  lung]
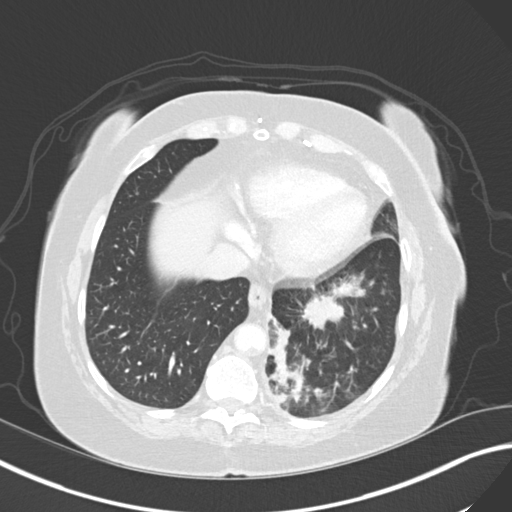
[im 64/148  lung]
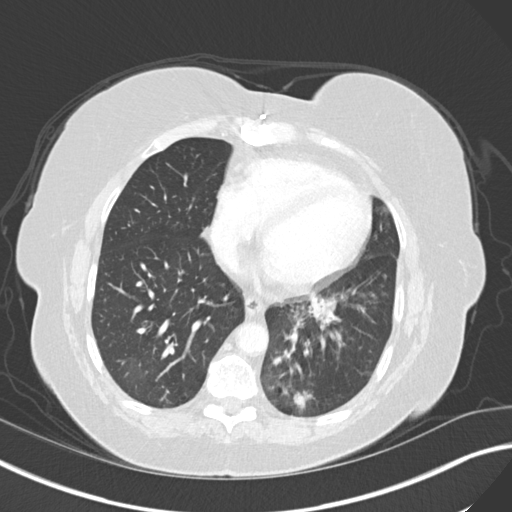
[im 85/148  lung]
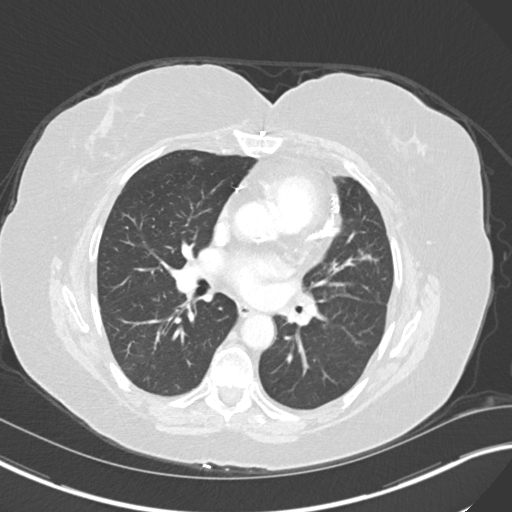
[im 95/148  lung]
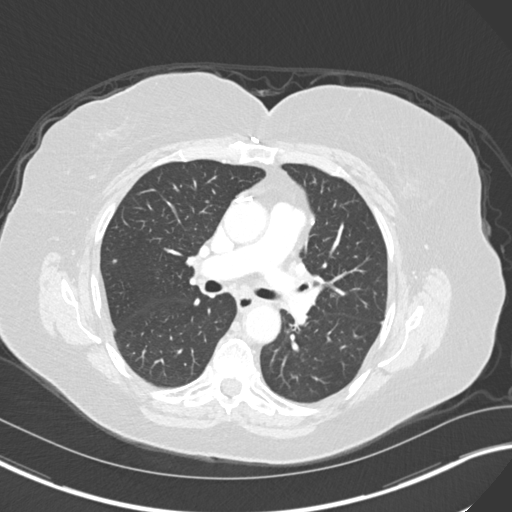
[im 106/148  mediastinal]
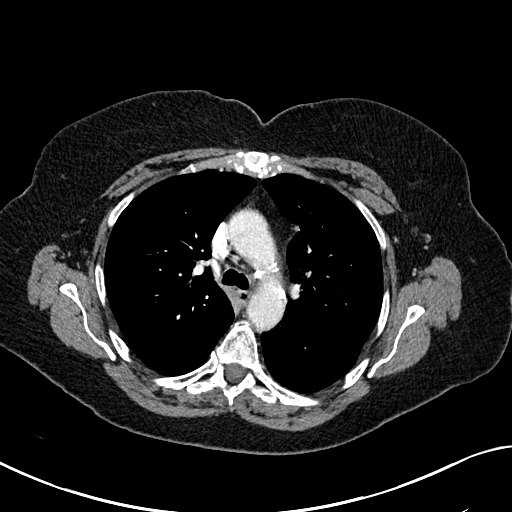
[im 106/148  lung]
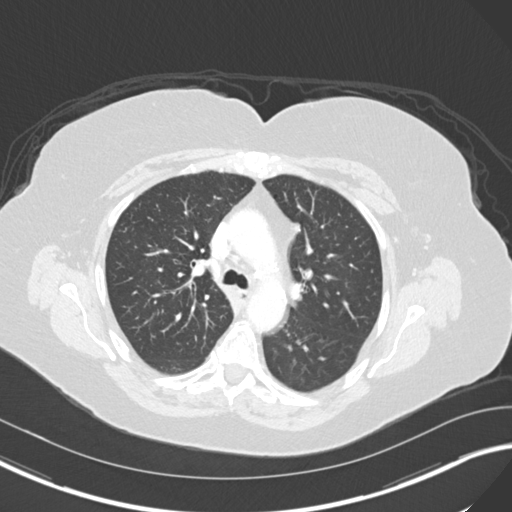
[im 116/148  lung]
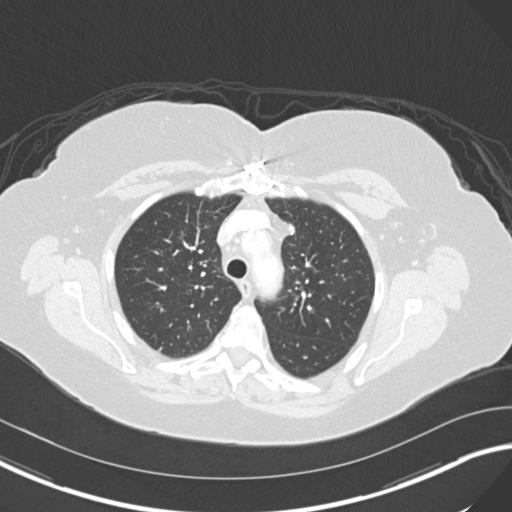
[im 127/148  lung]
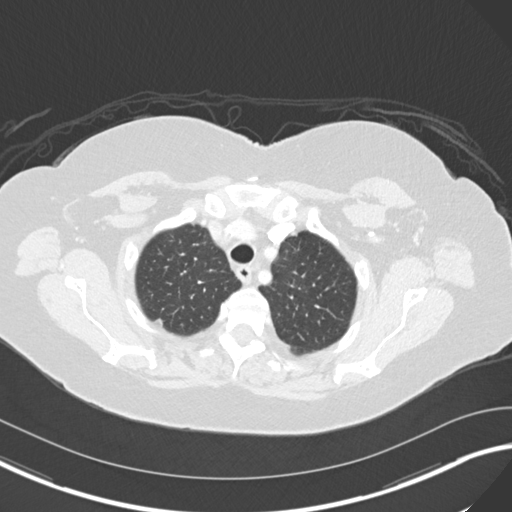
[im 137/148  lung]
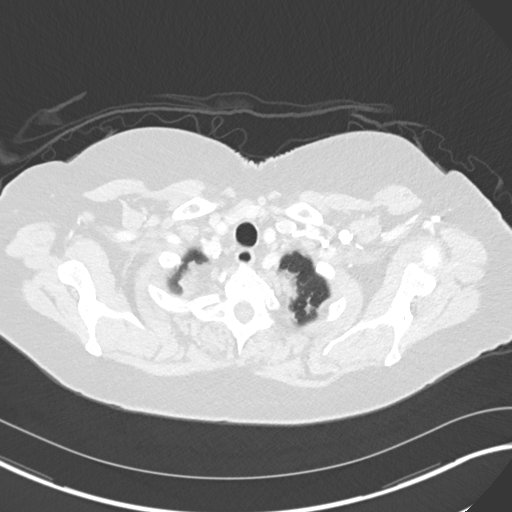

[Series 5: coronal · coronal · 0.59mm/px · 3 of 144 slices shown]
[im 29/144  lung]
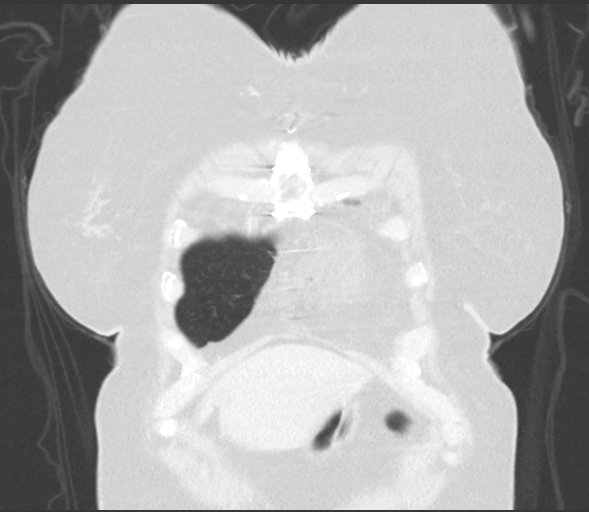
[im 58/144  lung]
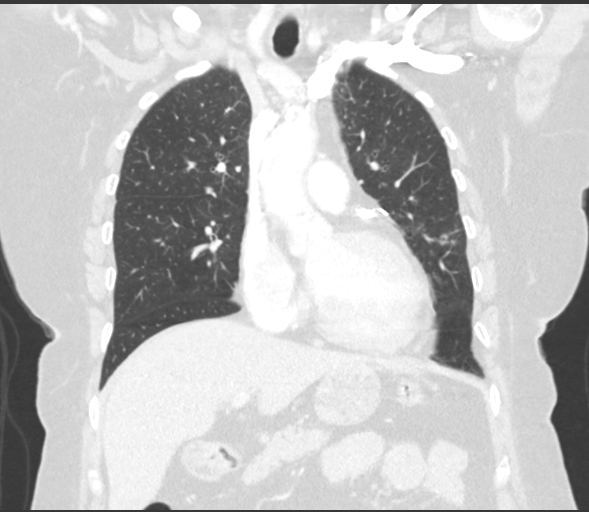
[im 86/144  lung]
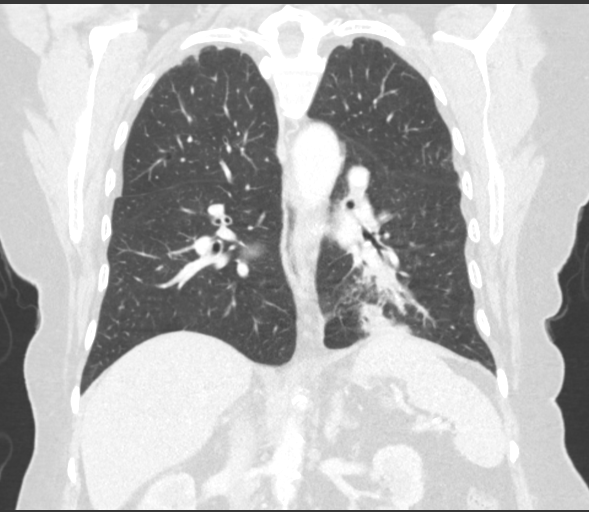

[15 of 36 positions shown; findings below may reference images not displayed]

FINDINGS: Cardiovascular: Bovine arch. Aortic and branch vessel
atherosclerosis. Also plaque in the descending thoracic aorta.
Normal heart size, without pericardial effusion. Native coronary
artery atherosclerosis. No central pulmonary embolism, on this
non-dedicated study.

Mediastinum/Nodes: No supraclavicular adenopathy. No mediastinal or
hilar adenopathy.

Lungs/Pleura: No pleural fluid. No change in partially calcified
nodularity along the right minor fissure, including on image
61/series 7.

The right lower lobe pulmonary nodule measures 8 x 5 mm on image
76/series 7. Felt to be similar to 7 x 6 mm on the prior exam (when
remeasured). Bilateral patchy airspace opacities with a nodular
morphology. These are most confluent within the left lower lobe, but
also identified within the lingula and right lower lobe. Some areas
of lingular nodularity have a "Tree-in-bud" morphology. Given this
factor, no evidence of pulmonary metastasis.

Upper Abdomen: Normal imaged portions of the liver, spleen, stomach,
pancreas, adrenal glands, kidneys. Advanced abdominal aortic and
branch vessel atherosclerosis.

Musculoskeletal: Mild thoracic spondylosis.
IMPRESSION: 1. Similar size of a right lower lobe pulmonary nodule.
2. Bilateral, left lower lobe predominant areas of airspace and
nodular opacity. Especially given the clinical history of fever and
cough on 01/21/2017, most consistent with subacute infection. This
decreases sensitivity for pulmonary metastasis.
3. No thoracic adenopathy.
4.  Aortic Atherosclerosis ([id number].M).

## 2017-12-30 ENCOUNTER — Other Ambulatory Visit: Payer: Self-pay | Admitting: Cardiology

## 2017-12-30 DIAGNOSIS — R079 Chest pain, unspecified: Secondary | ICD-10-CM

## 2018-01-01 ENCOUNTER — Ambulatory Visit (HOSPITAL_COMMUNITY)
Admission: RE | Admit: 2018-01-01 | Discharge: 2018-01-01 | Disposition: A | Discharge status: Home / Self Care | Payer: Medicare Other | Source: Ambulatory Visit | Attending: Cardiology | Admitting: Cardiology

## 2018-01-01 DIAGNOSIS — R079 Chest pain, unspecified: Secondary | ICD-10-CM | POA: Insufficient documentation

## 2018-01-01 MED ORDER — REGADENOSON 0.4 MG/5ML IV SOLN
0.4000 mg | Freq: Once | INTRAVENOUS | Status: AC
  Administered 2018-01-01: 0.4 mg via INTRAVENOUS

## 2018-01-01 MED ORDER — TECHNETIUM TC 99M TETROFOSMIN IV KIT
10.0000 | PACK | Freq: Once | INTRAVENOUS | Status: AC | PRN
  Administered 2018-01-01: 10 via INTRAVENOUS

## 2018-01-01 MED ORDER — REGADENOSON 0.4 MG/5ML IV SOLN
INTRAVENOUS | Status: AC
  Filled 2018-01-01: qty 5

## 2018-01-01 MED ORDER — TECHNETIUM TC 99M TETROFOSMIN IV KIT
30.0000 | PACK | Freq: Once | INTRAVENOUS | Status: AC | PRN
  Administered 2018-01-01: 30 via INTRAVENOUS

## 2018-01-02 ENCOUNTER — Other Ambulatory Visit: Payer: Self-pay | Admitting: Family Medicine

## 2018-01-02 DIAGNOSIS — Z1231 Encounter for screening mammogram for malignant neoplasm of breast: Secondary | ICD-10-CM

## 2018-01-07 ENCOUNTER — Other Ambulatory Visit (HOSPITAL_COMMUNITY): Payer: Self-pay | Admitting: Interventional Radiology

## 2018-01-07 DIAGNOSIS — I729 Aneurysm of unspecified site: Secondary | ICD-10-CM

## 2018-01-09 ENCOUNTER — Encounter (HOSPITAL_COMMUNITY)
Admission: RE | Disposition: A | Discharge status: Home / Self Care | Payer: Self-pay | Source: Ambulatory Visit | Attending: Cardiology

## 2018-01-09 ENCOUNTER — Ambulatory Visit (HOSPITAL_COMMUNITY)
Admission: RE | Admit: 2018-01-09 | Discharge: 2018-01-09 | Disposition: A | Discharge status: Home / Self Care | Payer: Medicare Other | Source: Ambulatory Visit | Attending: Cardiology | Admitting: Cardiology

## 2018-01-09 ENCOUNTER — Encounter (HOSPITAL_COMMUNITY): Payer: Self-pay | Admitting: Cardiology

## 2018-01-09 DIAGNOSIS — I209 Angina pectoris, unspecified: Secondary | ICD-10-CM | POA: Diagnosis present

## 2018-01-09 DIAGNOSIS — T82855A Stenosis of coronary artery stent, initial encounter: Secondary | ICD-10-CM | POA: Diagnosis not present

## 2018-01-09 DIAGNOSIS — Z7982 Long term (current) use of aspirin: Secondary | ICD-10-CM | POA: Insufficient documentation

## 2018-01-09 DIAGNOSIS — I110 Hypertensive heart disease with heart failure: Secondary | ICD-10-CM | POA: Diagnosis not present

## 2018-01-09 DIAGNOSIS — I5030 Unspecified diastolic (congestive) heart failure: Secondary | ICD-10-CM | POA: Insufficient documentation

## 2018-01-09 DIAGNOSIS — J45909 Unspecified asthma, uncomplicated: Secondary | ICD-10-CM | POA: Diagnosis not present

## 2018-01-09 DIAGNOSIS — D649 Anemia, unspecified: Secondary | ICD-10-CM | POA: Insufficient documentation

## 2018-01-09 DIAGNOSIS — Y831 Surgical operation with implant of artificial internal device as the cause of abnormal reaction of the patient, or of later complication, without mention of misadventure at the time of the procedure: Secondary | ICD-10-CM | POA: Diagnosis not present

## 2018-01-09 DIAGNOSIS — I2582 Chronic total occlusion of coronary artery: Secondary | ICD-10-CM | POA: Diagnosis not present

## 2018-01-09 DIAGNOSIS — I25719 Atherosclerosis of autologous vein coronary artery bypass graft(s) with unspecified angina pectoris: Secondary | ICD-10-CM | POA: Diagnosis not present

## 2018-01-09 DIAGNOSIS — I739 Peripheral vascular disease, unspecified: Secondary | ICD-10-CM | POA: Insufficient documentation

## 2018-01-09 DIAGNOSIS — E785 Hyperlipidemia, unspecified: Secondary | ICD-10-CM | POA: Insufficient documentation

## 2018-01-09 HISTORY — PX: LEFT HEART CATH AND CORS/GRAFTS ANGIOGRAPHY: CATH118250

## 2018-01-09 LAB — POCT ACTIVATED CLOTTING TIME: ACTIVATED CLOTTING TIME: 169 s

## 2018-01-09 SURGERY — LEFT HEART CATH AND CORS/GRAFTS ANGIOGRAPHY
Anesthesia: LOCAL

## 2018-01-09 MED ORDER — ONDANSETRON HCL 4 MG/2ML IJ SOLN
INTRAMUSCULAR | Status: DC | PRN
  Administered 2018-01-09: 4 mg via INTRAVENOUS

## 2018-01-09 MED ORDER — LIDOCAINE HCL (PF) 1 % IJ SOLN
INTRAMUSCULAR | Status: AC
  Filled 2018-01-09: qty 30

## 2018-01-09 MED ORDER — LIDOCAINE HCL (PF) 1 % IJ SOLN
INTRAMUSCULAR | Status: DC | PRN
  Administered 2018-01-09: 15 mL

## 2018-01-09 MED ORDER — FENTANYL CITRATE (PF) 100 MCG/2ML IJ SOLN
INTRAMUSCULAR | Status: DC | PRN
  Administered 2018-01-09 (×2): 25 ug via INTRAVENOUS

## 2018-01-09 MED ORDER — HEPARIN (PORCINE) IN NACL 2-0.9 UNITS/ML
INTRAMUSCULAR | Status: AC | PRN
  Administered 2018-01-09 (×2): 500 mL

## 2018-01-09 MED ORDER — MIDAZOLAM HCL 2 MG/2ML IJ SOLN
INTRAMUSCULAR | Status: DC | PRN
  Administered 2018-01-09 (×2): 1 mg via INTRAVENOUS

## 2018-01-09 MED ORDER — HEPARIN SODIUM (PORCINE) 1000 UNIT/ML IJ SOLN
INTRAMUSCULAR | Status: DC | PRN
  Administered 2018-01-09: 3000 [IU] via INTRAVENOUS

## 2018-01-09 MED ORDER — SODIUM CHLORIDE 0.9 % IV SOLN: INTRAVENOUS | Status: AC

## 2018-01-09 MED ORDER — ONDANSETRON HCL 4 MG/2ML IJ SOLN: 4.0000 mg | Freq: Four times a day (QID) | INTRAMUSCULAR | Status: DC | PRN

## 2018-01-09 MED ORDER — IOPAMIDOL (ISOVUE-370) INJECTION 76%
INTRAVENOUS | Status: DC | PRN
  Administered 2018-01-09: 85 mL via INTRAVENOUS

## 2018-01-09 MED ORDER — SODIUM CHLORIDE 0.9 % WEIGHT BASED INFUSION: 1.0000 mL/kg/h | INTRAVENOUS | Status: DC

## 2018-01-09 MED ORDER — SODIUM CHLORIDE 0.9% FLUSH: 3.0000 mL | INTRAVENOUS | Status: DC | PRN

## 2018-01-09 MED ORDER — CLOPIDOGREL BISULFATE 75 MG PO TABS
ORAL_TABLET | ORAL | Status: AC
  Administered 2018-01-09: 75 mg via ORAL
  Filled 2018-01-09: qty 1

## 2018-01-09 MED ORDER — FENTANYL CITRATE (PF) 100 MCG/2ML IJ SOLN
INTRAMUSCULAR | Status: AC
  Filled 2018-01-09: qty 2

## 2018-01-09 MED ORDER — ASPIRIN 81 MG PO CHEW
CHEWABLE_TABLET | ORAL | Status: AC
  Administered 2018-01-09: 81 mg via ORAL
  Filled 2018-01-09: qty 1

## 2018-01-09 MED ORDER — HEPARIN (PORCINE) IN NACL 1000-0.9 UT/500ML-% IV SOLN
INTRAVENOUS | Status: AC
  Filled 2018-01-09: qty 1000

## 2018-01-09 MED ORDER — IOPAMIDOL (ISOVUE-370) INJECTION 76%
INTRAVENOUS | Status: AC
  Filled 2018-01-09: qty 125

## 2018-01-09 MED ORDER — ASPIRIN 81 MG PO CHEW
81.0000 mg | CHEWABLE_TABLET | ORAL | Status: AC
  Administered 2018-01-09: 81 mg via ORAL

## 2018-01-09 MED ORDER — CLOPIDOGREL BISULFATE 75 MG PO TABS
75.0000 mg | ORAL_TABLET | Freq: Once | ORAL | Status: AC
  Administered 2018-01-09: 75 mg via ORAL

## 2018-01-09 MED ORDER — MIDAZOLAM HCL 2 MG/2ML IJ SOLN
INTRAMUSCULAR | Status: AC
  Filled 2018-01-09: qty 2

## 2018-01-09 MED ORDER — SODIUM CHLORIDE 0.9 % WEIGHT BASED INFUSION
3.0000 mL/kg/h | INTRAVENOUS | Status: DC
  Administered 2018-01-09 (×2): 3 mL/kg/h via INTRAVENOUS

## 2018-01-09 MED ORDER — SODIUM CHLORIDE 0.9 % IV SOLN: 250.0000 mL | INTRAVENOUS | Status: DC | PRN

## 2018-01-09 MED ORDER — SODIUM CHLORIDE 0.9% FLUSH: 3.0000 mL | Freq: Two times a day (BID) | INTRAVENOUS | Status: DC

## 2018-01-09 MED ORDER — ONDANSETRON HCL 4 MG/2ML IJ SOLN
INTRAMUSCULAR | Status: AC
  Filled 2018-01-09: qty 2

## 2018-01-09 MED ORDER — ASPIRIN 81 MG PO CHEW: 81.0000 mg | CHEWABLE_TABLET | ORAL | Status: DC

## 2018-01-09 MED ORDER — ACETAMINOPHEN 325 MG PO TABS
650.0000 mg | ORAL_TABLET | Freq: Four times a day (QID) | ORAL | Status: DC | PRN
  Administered 2018-01-09: 650 mg via ORAL

## 2018-01-09 MED ORDER — ACETAMINOPHEN 325 MG PO TABS
ORAL_TABLET | ORAL | Status: AC
  Filled 2018-01-09: qty 2

## 2018-01-09 SURGICAL SUPPLY — 10 items
CATH 5FR JL3.5 JR4 ANG PIG MP (CATHETERS) ×1 IMPLANT
CATH INFINITI 5 FR IM (CATHETERS) ×1 IMPLANT
KIT HEART LEFT (KITS) ×2 IMPLANT
PACK CARDIAC CATHETERIZATION (CUSTOM PROCEDURE TRAY) ×2 IMPLANT
SHEATH PINNACLE 5F 10CM (SHEATH) ×2 IMPLANT
SYR MEDRAD MARK V 150ML (SYRINGE) ×2 IMPLANT
TRANSDUCER W/STOPCOCK (MISCELLANEOUS) ×2 IMPLANT
WIRE EMERALD 3MM-J .035X150CM (WIRE) ×1 IMPLANT
WIRE EMERALD 3MM-J .035X260CM (WIRE) ×1 IMPLANT
WIRE HI TORQ VERSACORE-J 145CM (WIRE) ×1 IMPLANT

## 2018-01-09 NOTE — Interval H&P Note (Signed)
RCA in 1 any recommendedCath Lab Visit (complete for each Cath Lab visit)  Clinical Evaluation Leading to the Procedure:   ACS:   Non-ACS:    Anginal Classification: CCS III  Anti-ischemic medical therapy: Maximal Therapy (2 or more classes of medications)  Non-Invasive Test Results: Intermediate-risk stress test findings: cardiac mortality 1-3%/year  Prior CABG: Previous CABG      History and Physical Interval Note:  01/09/2018 7:26 AM  [name]  has presented today for surgery, with the diagnosis of cp, abnormal stress test  The various methods of treatment have been discussed with the patient and family. After consideration of risks, benefits and other options for treatment, the patient has consented to  Procedure(s): LEFT HEART CATH AND CORONARY ANGIOGRAPHY (N/A) as a surgical intervention .  The patient's history has been reviewed, patient examined, no change in status, stable for surgery.  I have reviewed the patient's chart and labs.  Questions were answered to the patient's satisfaction.     Charolette Forward

## 2018-01-09 NOTE — H&P (Signed)
The printed H&P in the chart needs to be scanned

## 2018-01-09 NOTE — Progress Notes (Signed)
Site area: Right groin a 5 french arterial sheath was removed  Site Prior to Removal:  Level 0  Pressure Applied For 20 MINUTES    Bedrest Beginning at 0850am  Manual:   Yes.    Patient Status During Pull:  stable  Post Pull Groin Site:  Level 0  Post Pull Instructions Given:  Yes.    Post Pull Pulses Present:  Yes.    Dressing Applied:  Yes.    Comments:  VS remain stable

## 2018-01-09 NOTE — Discharge Instructions (Signed)
Drink plenty of water over next 48 hours  Femoral Site Care Refer to this sheet in the next few weeks. These instructions provide you with information about caring for yourself after your procedure. Your health care provider may also give you more specific instructions. Your treatment has been planned according to current medical practices, but problems sometimes occur. Call your health care provider if you have any problems or questions after your procedure. What can I expect after the procedure? After your procedure, it is typical to have the following:  Bruising at the site that usually fades within 1-2 weeks.  Blood collecting in the tissue (hematoma) that may be painful to the touch. It should usually decrease in size and tenderness within 1-2 weeks.  Follow these instructions at home:  Take medicines only as directed by your health care provider.  You may shower 24-48 hours after the procedure or as directed by your health care provider. Remove the bandage (dressing) and gently wash the site with plain soap and water. Pat the area dry with a clean towel. Do not rub the site, because this may cause bleeding.  Do not take baths, swim, or use a hot tub until your health care provider approves.  Check your insertion site every day for redness, swelling, or drainage.  Do not apply powder or lotion to the site.  Limit use of stairs to twice a day for the first 2-3 days or as directed by your health care provider.  Do not squat for the first 2-3 days or as directed by your health care provider.  Do not lift over 10 lb (4.5 kg) for 5 days after your procedure or as directed by your health care provider.  Ask your health care provider when it is okay to: ? Return to work or school. ? Resume usual physical activities or sports. ? Resume sexual activity.  Do not drive home if you are discharged the same day as the procedure. Have someone else drive you.  You may drive 24 hours after  the procedure unless otherwise instructed by your health care provider.  Do not operate machinery or power tools for 24 hours after the procedure or as directed by your health care provider.  If your procedure was done as an outpatient procedure, which means that you went home the same day as your procedure, a responsible adult should be with you for the first 24 hours after you arrive home.  Keep all follow-up visits as directed by your health care provider. This is important. Contact a health care provider if:  You have a fever.  You have chills.  You have increased bleeding from the site. Hold pressure on the site. Get help right away if:  You have unusual pain at the site.  You have redness, warmth, or swelling at the site.  You have drainage (other than a small amount of blood on the dressing) from the site.  The site is bleeding, and the bleeding does not stop after 30 minutes of holding steady pressure on the site.  Your leg or foot becomes pale, cool, tingly, or numb. This information is not intended to replace advice given to you by your health care provider. Make sure you discuss any questions you have with your health care provider. Document Released: 04/02/2014 Document Revised: 01/05/2016 Document Reviewed: 02/16/2014 Elsevier Interactive Patient Education  [employer].

## 2018-01-10 MED FILL — Heparin Sod (Porcine)-NaCl IV Soln 1000 Unit/500ML-0.9%: INTRAVENOUS | Qty: 1000 | Status: AC

## 2018-01-13 ENCOUNTER — Ambulatory Visit: Payer: Medicare HMO | Admitting: Pulmonary Disease

## 2018-01-14 ENCOUNTER — Ambulatory Visit (HOSPITAL_COMMUNITY)
Admission: RE | Admit: 2018-01-14 | Discharge: 2018-01-14 | Disposition: A | Discharge status: Home / Self Care | Payer: Medicare Other | Source: Ambulatory Visit | Attending: Interventional Radiology | Admitting: Interventional Radiology

## 2018-01-14 ENCOUNTER — Encounter (HOSPITAL_COMMUNITY): Payer: Self-pay | Admitting: Radiology

## 2018-01-14 DIAGNOSIS — I729 Aneurysm of unspecified site: Secondary | ICD-10-CM

## 2018-01-14 DIAGNOSIS — I6789 Other cerebrovascular disease: Secondary | ICD-10-CM | POA: Diagnosis not present

## 2018-01-20 ENCOUNTER — Ambulatory Visit: Payer: Medicare HMO | Admitting: Pulmonary Disease

## 2018-01-20 ENCOUNTER — Ambulatory Visit (HOSPITAL_COMMUNITY): Payer: Medicare Other

## 2018-01-29 ENCOUNTER — Telehealth (HOSPITAL_COMMUNITY): Payer: Self-pay

## 2018-01-29 NOTE — Telephone Encounter (Signed)
Called pt regarding recent mra, no answer, left message for pt to return call. AW

## 2018-02-06 ENCOUNTER — Telehealth (HOSPITAL_COMMUNITY): Payer: Self-pay

## 2018-02-06 NOTE — Telephone Encounter (Signed)
Pt agreed to f/u in 1 year with mra only. AW

## 2018-02-10 ENCOUNTER — Ambulatory Visit (INDEPENDENT_AMBULATORY_CARE_PROVIDER_SITE_OTHER): Payer: Medicare Other | Admitting: Pulmonary Disease

## 2018-02-10 ENCOUNTER — Ambulatory Visit (HOSPITAL_COMMUNITY)
Admission: RE | Admit: 2018-02-10 | Discharge: 2018-02-10 | Disposition: A | Discharge status: Home / Self Care | Payer: Medicare Other | Source: Ambulatory Visit | Attending: Internal Medicine | Admitting: Internal Medicine

## 2018-02-10 ENCOUNTER — Encounter: Payer: Self-pay | Admitting: Pulmonary Disease

## 2018-02-10 VITALS — BP 134/78 | HR 67 | Temp 97.7°F | Ht 62.0 in | Wt 156.2 lb

## 2018-02-10 DIAGNOSIS — J449 Chronic obstructive pulmonary disease, unspecified: Secondary | ICD-10-CM | POA: Diagnosis not present

## 2018-02-10 DIAGNOSIS — C3490 Malignant neoplasm of unspecified part of unspecified bronchus or lung: Secondary | ICD-10-CM | POA: Diagnosis present

## 2018-02-10 DIAGNOSIS — D500 Iron deficiency anemia secondary to blood loss (chronic): Secondary | ICD-10-CM

## 2018-02-10 DIAGNOSIS — I700 Atherosclerosis of aorta: Secondary | ICD-10-CM | POA: Insufficient documentation

## 2018-02-10 DIAGNOSIS — Z951 Presence of aortocoronary bypass graft: Secondary | ICD-10-CM

## 2018-02-10 DIAGNOSIS — I2510 Atherosclerotic heart disease of native coronary artery without angina pectoris: Secondary | ICD-10-CM | POA: Diagnosis not present

## 2018-02-10 DIAGNOSIS — C3431 Malignant neoplasm of lower lobe, right bronchus or lung: Secondary | ICD-10-CM | POA: Diagnosis not present

## 2018-02-10 DIAGNOSIS — R5381 Other malaise: Secondary | ICD-10-CM | POA: Diagnosis not present

## 2018-02-10 DIAGNOSIS — R918 Other nonspecific abnormal finding of lung field: Secondary | ICD-10-CM | POA: Diagnosis not present

## 2018-02-10 DIAGNOSIS — I10 Essential (primary) hypertension: Secondary | ICD-10-CM | POA: Diagnosis not present

## 2018-02-10 DIAGNOSIS — I739 Peripheral vascular disease, unspecified: Secondary | ICD-10-CM

## 2018-02-10 DIAGNOSIS — I6523 Occlusion and stenosis of bilateral carotid arteries: Secondary | ICD-10-CM

## 2018-02-10 DIAGNOSIS — R0602 Shortness of breath: Secondary | ICD-10-CM | POA: Diagnosis not present

## 2018-02-10 NOTE — Progress Notes (Addendum)
Subjective:     Patient ID: [name], female   DOB: September 20, 1940, 77 y.o.   MRN: [id number]  HPI  ~  October 28, 2015:  Initial pulmonary consult by SN>  PCP is DrBouska    6 y/o WF, mother of [employer], w/ hx COPD, HBP, CAD-s/p 3 vessel CABG, severe ASPVD, HL, HH/GERD, Divertics, etc...    She was prev evaluated by DrWert in 2014 & DrGonzalez before that>  She had mult medication intolerances and seemed to do better w/ NEBS; also had hx chr sinusitis & GERD...     Daughter called w/ request for a post-hosp f/u visit>  She was [facility] 3/11 - 10/24/15 by [facility] w/ CAP, COPD exacerbation;  She presented w/ cough, small amt yellow sput, temp to 101, and incr SOB;  ER eval revealed T101.3, & exam w/ decr BS +wheezes and basilar rhonchi;  CXR showed patchy RLL airsp dis, Flu panel neg, unable to get sput for culture, Labs- Cr=1.27, BS~140-190, Hg~10-11 w/ MCV=83;  She was treated w/ O2, IVF, Duoneb, Levaquin & Pred;  Disch on Duoneb via nebulizer Qid, Dulera100-2spBid, Doxy100Bid, Pred taper...     Currently she feels back to baseline w/ sl cough, white sput & no discoloration or blood, +DOE w/ exertion and some ADLs, no CP, palpit, dizzy, edema;  On NEB w/ DUONEB Qid, DULERA100-2spBid, Pred10mg  tapering sched from recent [place] as needed, Zyrtek prn...  Smoking Hx>  Ex-smoker- started age 23, smoked for ~40 yrs up to 1+ppd, and quit in 2002 when she had CABG; total = 45+pack-year smoking history...   Pulmonary Hx>  Hx "asthma", COPD, ex-smoker, hx recurrent bronchits, hx pneumonia 3/17  Medical Hx>  HBP, CAD- s/p 3 vessel CABG in 2002, severe ASPVD w/ Ao-Fem-Pop & left CAE 2011, 53mm right MCA aneurysm, HH/GERD/ Divertics, colon polyps, HAs (s/p craniotomy for Arnold-chiari malformation)  Family Hx>  One sis w/ asthma, otherw all atherosclerosis related...  Occup Hx>  No known asbestos or toxic exposures  Current Meds>  ASA/Plavix, AZOR 5/40, Catapress0.1-1/2Bid, Crestor10, Dexilant60,  (not on DM meds- "I'm pre-diabetic")... EXAM shows Afeb, VSS, O2sat=98% on RA; Wt=174#, 5'2"Tall, BMI=31;  HEENT- neg, mallampati1, bilat CBruits & L-CAE scar;  Chest- decrBS, clear w/o w/r/r;  Heart- CABG scar, RR Gr1-2/6 SEM w/o r/g;  Abd- +epig bruit, w/o masses;  Ext- w/o c/ce;  Neuro- w/o focal deficits...   PF reported from 2013>  FEV1=1.30 (58%), %1sec=65, no change post bronchdil, DLCO=74%  CT Angio Chest 08/02/14 showed NEG for PE, extensive CAD- s/p CABG, subsolid nodules in RML/ RLL pleural thickening & bibasilar atx, hepatic steatosis, no adenopathy...  CXR 10/22/15 showed norm heart size, s/p CABG, patchy airsp dis in right base, incr interstitial markings diffusely, biapical scarring...   CXR 10/28/15 shows resolved patchy RLL pneumonia, norm heart size, s/p CABG, atherosclerotic changes in Ao, etc...   Spirometry 10/28/15>  FVC=1.62 (63%), FEV1=1.19 (60%), %1sec=73, mid-flows reduced at 48% predicted;  This is c/w a mild obstructive ventilatory defect & GOLD Stage2 COPD, can't r/o superimposed restriction w/o LV measurement.  Ambulatory Oximetry 10/28/15> O2sat=100% on RA at rest w/ pulse=61;  Pt ambulated 3Laps in the office w/ lowest O2sat=99% w/ pulse=73/min...   LABS reviewed in EPIC>  She needs A1c, Anemia labs, TSH if not checked in the interval by DrBoushka... IMP >>    COPD, GOLD Stage 2 disease    Ex-smoker-- quit 2002, 45+pack-year smoking hx    CAP 3/17-- NOS, resolved w/ Levaquin,  Doxy, but she claims allergic to everything...    Cardiac issues>  HBP, CAD- s/p 3 vessel CABG in 2002, severe ASPVD w/ left CAE 2011, 17mm right MCA aneurysm-- followed by Elizabeth Sauer, [facility]- DrDickson, IR-DrDeveshwar...    Medical issues>  HBP, HL, pre-diabetes, HH/GERD/ Divertics, colon polyps, HAs (s/p craniotomy for Arnold-Chiari malformation)-- followed by DrBoushka & DrWillis... PLAN >>     We discussed her COPD and recent hosp for CAP;  She is asked to use the NEBULIZER w/ DUONEB  Tid regularly, DULERA100-2spBid, and MUCINEX 600mg  1-2tabs bid w/ fluids... She will maintain regular follow up w/ her numerous physicians and plan pulm recheck in 6-8 wks...   ~  Dec 12, 2015:  6wk ROV w/ SN>  Mareesa returns on her NEBULIZER w/ Duoneb Qid, Dulera100-2spBid, & AlbutHFA rescue inhaler as needed; she has finished her prev Pred taper and notes that her breathing is improved & she feels back to baseline- sl cough, small amt whitish sput, min end-exp wheezing, and chr stable DOE, she denies CP/ hemoptysis/ chest tightness/ f/c/s, etc...     COPD, GOLD Stage 2 disease>  Back to baseline on her NEB w/ Duoneb Qid, Dulera100-2spBid, AlbutHFA rescue as needed; Rec to continue same + gradual exercise program & work on wt reduction...    Ex-smoker-- quit 2002, 45+pack-year smoking hx    CAP 3/17-- NOS, resolved w/ Levaquin, Doxy, but she claims allergic to everything...    Cardiac issues>  HBP, CAD- s/p 3 vessel CABG in 2002, severe ASPVD w/ left CAE 2011, 73mm right MCA aneurysm-- followed by Elizabeth Sauer, [facility]- DrDickson, IR-DrDeveshwar...    Medical issues>  HBP, HL, borderline diabetes, HH/GERD/ Divertics, colon polyps, HAs (s/p craniotomy for Arnold-Chiari malformation), Anemia-- followed by DrBoushka & DrWillis... EXAM shows Afeb, VSS, O2sat=98% on RA; Wt=172#, 5'2"Tall, BMI=31;  HEENT- neg, mallampati1, bilat CBruits & L-CAE scar;  Chest- decrBS, clear w/o w/r/r;  Heart- CABG scar, RR Gr1-2/6 SEM w/o r/g;  Abd- +epig bruit, w/o masses;  Ext- w/o c/ce;  Neuro- w/o focal deficits...  IMP/PLAN>>  [name] is approaching her baseline, s/p pneumonia 10/2015;  Asked to continue NEBs Tid-Qid, Dulera Bid, & rescue inhaler as needed;  She will maintain general med follow up w/ DrBouska, Cards w/ DrHarwani, [facility]- DrDickson, and Neuro- DrWillis;  We plan ROV recheck 3 months...  ADDENDUM>>  Kyilee went to the ER 02/24/16 c/o nausea, vomiting, & diarrhea assoc w/ some crampy abd pain; nothing acute was  found on exam & eval- given Zofran w/ improvement; she also reported SOB & CXR 02/24/16 showed norm heart size, s/pCABG, hyperexpanded lungs, vague nodular opac in left base & right perihilar areas, otherw clear w/o pneumonia or edema;  She followed up w/ DrBoushka & had a CT Chest done 02/28/16 showing scarring in the apicies, and a dominant spiculated 42mm posterior RLL nodule (prev CTA in 2015 showed it at 59mm), several other tiny opacities noted, no adenopathy;  A subseq PET-CT 03/14/16 confirmed a hypermetabolic RLL nodule & no other hypermet lesions seen;  She had a needle Bx 04/11/16=> pos for squamous cell ca...     She was seen by DrBartle for TSurg but he felt that she was not an operative candidate based on her COPD & severe vascular pathology & suggested SBRT;  She has since been evaluated [facility] for Rad[facility] & she is pending the start of XRT at this time...   FullPFTs 04/10/16 showed FVC=1.53 (57%), FEV1=1.09 (54%), %1sec=71, mid-flows reduced at 42%  predicted; post bronchodil FEV1 improved 17% to 1.28L;  TLC=3.77 (79%), RV=2.07 (96%), RV/TLC=55%;  DLCO=51% predicted and the DL/VA=86% predicted...  ~  June 18, 2016:  75mo ROV w/ SN>  & Griffing returns stating "I'm tired of doctors"- not feeling well, tired, freezing, giving out w/ min activ & ADLs, SOB all the time, etc... I've reviewed the recent Epic data>>    She was eval by DrBartle 04/04/16> not felt to be a surg candidate due to her severe vasc dis & underlying COPD; needle bx of RLL lesion was pos for Squamous Cell Ca=> referred for XRT & seen by DrManning...    She completed her XRT w/ DrManning & was seen 05/28/16>  s/p curative SBRT- 54 Gy in 3 fractions (05/2016) & tol well...    She saw DrHung, GI for anemia w/u w/ EGD/ Colon 05/25/16>  EGD was WNL;  Colon was also WNL...    She was seen in [place] 06/04/16 w/ cough & right sided CP x1wk, note reviewed, CXR/ CT Angio/ LABS- all below; she was not given antibiotic & disch for f/u by  PCP...    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     We reviewed the following updated medical problems during today's office visit >>     COPD, GOLD Stage 2 disease (w/ revers component)>  Back to baseline on her NEB w/ Duoneb Qid (she is only doing it prn "I don't need it"), Dulera100-2spBid, AlbutHFA rescue as needed; Rec to continue same + gradual exercise program & work on wt reduction...    RLL Pulm nodule=> Bx 04/11/16= Squamous cell ca & pt eval by DrBartle, DrMohamed, DrManning w/ SBRT given 05/2016 (54 Gy in 3 fractions)...    Ex-smoker-- quit 2002, 45+pack-year smoking hx    CAP 3/17-- NOS, resolved w/ Levaquin, Doxy, but she claims allergic to everything; she also had bronchitis 05/2016 treated w/ Augmentiun & improved...    Cardiac issues>  HBP, CAD- s/p 3 vessel CABG in 2002, severe ASPVD w/ left CAE 2011, 17mm right MCA aneurysm-- followed by Elizabeth Sauer, [facility]- DrDickson, IR-DrDeveshwar...    Medical issues>  HBP, HL, borderline diabetes, HH/GERD/ Divertics, colon polyps, HAs (s/p craniotomy for Arnold-Chiari malformation), Anemia-- followed by DrBoushka & DrWillis... EXAM shows Afeb, VSS, O2sat=100% on RA; Wt=173#, 5'2"Tall, BMI=31;  HEENT- neg, mallampati1, bilat CBruits & L-CAE scar;  Chest- decrBS, clear w/o w/r/r;  Heart- CABG scar, RR Gr1-2/6 SEM w/o r/g;  Abd- +epig bruit, w/o masses;  Ext- w/o c/ce;  Neuro- w/o focal deficits...   CXR 06/04/16>  Norm heart size & prior CABG, 1.4cm RLL nodule, no infiltrate or effusion- NAD...   CT Angio Chest 06/04/16>  s/p CABG & diffuse aortic calcif & heavy  coronary calcif, NEG for PE, stable RLL pulm nodule, no adenopathy, chr changes in the lungs bilat...  LABS 05/2016>  CBC- Hg=9.1, mcv=74;  Chems- Cr=1.30;  Fe=27 (7%sat),  Ferritin=8;  B12=546 IMP/PLAN>>  Janesia has been thru a lot but improved after SBRT for RLL squamous cell ca, recent Feraheme for anemia, and Augmentin for bronchitis; she  is encouraged to stay on the [facility] Bid & use the NEBS as needed; definitely needs to incr physical activity & get her weight down... we plan rov in 49mo for pulm check...  ~  November 08, 2016:  4-38mo ROV & post hosp visit/ pulmonary follow up>  Nada is followed by PCP- DrBouska & has been seen 7 times over the interval;  She was HOSP 2/3 - 09/17/16 w/ BRB per rectum assoc w/ mild lower abd pain; prev eval by GI (DrHung) w/ EGD & Colonoscopy 05/2016 done for IDA (both were NEG), capsule endo was rec but not done;  CT Abd & Pelvis was neg for ischemic colitis;  ASA & Plavix was held & she improved- ?etiology (poss divertic bleed)... We reviewed the following medical problems during today's office visit >>     COPD, GOLD Stage 2 disease (w/ revers component)>  Back to baseline on her NEB w/ Duoneb Qid (she is only doing it prn "I don't need it"), Dulera100-2spBid, AlbutHFA rescue as needed; Rec to continue same + gradual exercise program & work on wt reduction... She was seen [facility] 07/14/16 w/ bronchitic exac(dyspnea, cough, r-rib pain), treated w/ Amox & Toradol for pain... She is too sedentary & SOB w/ ADLs...     RLL Pulm nodule=> Bx 04/11/16= Squamous cell ca & pt eval by DrBartle, DrMohamed, DrManning w/ SBRT given 05/2016 (54 Gy in 3 fractions); Last seen by DrMohamed 08/08/16- Stage 1A non-small-cell lung ca, RLL nodule s/p XRT, & persistent anemia=> given Feraheme 06/2016;  CT Chest 07/2016 showed decr size ofnodule from 60mm to 75mm, no sign mets; DrM rec f/u scan in 67mo...    Ex-smoker-- quit 2002, 45+pack-year smoking hx    CAP 3/17-- NOS, resolved w/ Levaquin,  Doxy, but she claims allergic to everything; she also had bronchitis 05/2016 treated w/ Augmentiun & improved...    Cardiac issues>  HBP, CAD- s/p 3 vessel CABG in 2002, severe ASPVD w/ bilat SFA stents 2003 & left CAE 2011, 40mm right MCA aneurysm-- followed by Elizabeth Sauer, [facility]- DrDickson (07/2016), IR-DrDeveshwar... CDoppler 07/2016 showed 40-59% R-ICA stenosis & patent L-CAE site w/o restenosis...    Medical issues>  HBP, HL, borderline diabetes, HH/GERD/ Divertics, colon polyps, HAs (s/p craniotomy for Arnold-Chiari malformation), Anemia-- followed by DrWillis & DrBoushka... EXAM shows Afeb, VSS, O2sat=99% on RA; Wt=168#, 5'2"Tall, BMI=31;  HEENT- neg, mallampati1, bilat CBruits & L-CAE scar;  Chest- decrBS, clear w/o w/r/r;  Heart- CABG scar, RR Gr1-2/6 SEM w/o r/g;  Abd- +epig bruit, w/o masses;  Ext- w/o c/ce;  Neuro- w/o focal deficits...   CXR 07/14/16>  S/p CABG, prev RLL nodule not ell vis, stable biapical scarring, NAD...   CT Angio Chest 07/14/16>  NEG for PE, atherosclerosis of Ao/ coronaries/ great vessels; s/p median sternotomy & CABG; no adenopathy, RLL nodule decr to 39mm (prev 73mm), stable scarring lin lingula & LLL, no new lesions...   CXR 11/08/16 (independently reviewed by me in the PACS system) showed norm heart size, s/p CABG, no adenopathy, stable ill-defined RLL nodular opacity (s/p XRT)...  LABS 11/08/16>  CBC- Hg=12.2, mcv=90, Fe=97 (26%sat), Ferritin=59,  TSH=2.17... IMP/PLAN>>  REC to use her NEB (Duoneb) Tid followed by [id number]- 2spBid and start a regular exercise program; continue follow up w/ her PCP-DrBouska & mult specialty physicians...   ~  March 14, 2017:  75mo ROV & [place] reports that she is "doing just fine"- she had pneumonia x 1 in the interval & wants to blame the  Neb rx and Advair "I don't care what I do, I get pneumonia" and "DrBouska says I'm, his problem child";  It is clear that she is going to REFUSE the NEBS and ADVAIR going forward despite our  conversation to the contrary...  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   CT Chest 01/28/17>  Norm heart size w/ atherosclerosis in coronaries, Ao & branch vessels; no adenopathy; lungs show RLL nodule measures 8x61mm (similar to prev), bilat patchy airsp opac w/ nodular morphology & some tree-in-bud morphology...    She saw PA-Perkins at XRT 01/22/17>  Stage 1A SCCa RLL w/ XRT completed 05/28/16 (54 Gy delivered in 3 fractions of 18Gy each); note reviewed- treated by PCP for left sided pneumonia, and f/u CT w/ stability of the RLL nodule.    She's had mult f/u visits w/ PCP- DrBouska>  Records in Care Everywhere reviewed--  Back pain treated w/ Pred & improved, he presented in June w/ fever to 101, chills, sore throat, deep cough & SOB;  CXR showed patchy LLL opac (confirmed on subseq CT 6/18), and WBC=12.7;  She was treated w/ Augmentin=> later changed to SeptraDS due to ?rash, & improved...   CXR 01/21/17 by [facility], avail for review in PACS system>  Focal patchy infiltrate in LLL medially... We reviewed the following medical problems during today's office visit >>     COPD, GOLD Stage 2 disease (w/ revers component)>  She wants to blame her 6/18 LLL pneumonia on her nebulizer & Advair=> refuses to use these going forward! (the truth is that she refuses meds due to cost);  We discussed using the Duoneb prn and trying ANORO one inhalation daily;  She is still too sedentary & NOT motivated to exercise- needs referral to [place] program & she is encouraged to participate!    RLL Pulm nodule=> Bx 04/11/16= Squamous cell ca  & pt eval by DrBartle, DrMohamed, DrManning w/ SBRT given 05/2016 (54 Gy in 3 fractions); Last seen by DrMohamed 01/2017- Stage 1A non-small-cell lung ca, RLL nodule s/p XRT, & persistent anemia=> given Feraheme 06/2016;  CT Chest f/u 6/18 showed stable size of nodule ~7-26mm, no sign mets; DrM rec f/u scan in 40mo...    Ex-smoker-- quit 2002, 45+pack-year smoking hx    CAP 3/17 & 6/18-- NOS, 1st resolved w/ Levaquin, Doxy, but she claims allergic to everything; 2nd resolved w/ Augmentin, SeptraDS; she also had bronchitis 05/2016 treated w/ Augmentiun & improved...    Cardiac issues>  HBP, CAD- s/p 3 vessel CABG in 2002, severe ASPVD w/ bilat SFA stents 2003 & left CAE 2011, 38mm right MCA aneurysm-- followed by Elizabeth Sauer, [facility]- DrDickson (07/2016), IR-DrDeveshwar... CDoppler 07/2016 showed 40-59% R-ICA stenosis & patent L-CAE site w/o restenosis...    Medical issues>  HBP, HL, borderline diabetes, HH/GERD/ Divertics, colon polyps, HAs (s/p craniotomy for Arnold-Chiari malformation), Anemia-- followed by DrBoushka & DrWillis... EXAM shows Afeb, VSS, O2sat=97% on RA; Wt=163#, 5'2"Tall, BMI=30;  HEENT- neg, mallampati1, bilat CBruits & L-CAE scar;  Chest- decrBS, clear w/o w/r/r;  Heart- CABG scar, RR Gr1-2/6 SEM  w/o r/g;  Abd- +epig bruit, w/o masses;  Ext- w/o c/ce;  Neuro- w/o focal deficits...   LABS 01/2017>  Chems- ok w/ BS=110, Cr=1.3, LFTs wnl;  CBC- Hg=10.8 IMP/PLAN>>  Reeshemah refuses NEBs and Advair-- try ANORO one inhalation daily & encouraged to use the NEB vs rescue inhaler prn;  She desperately need to incr exercise program & can't vs won't do it on her own-- refer to [place] [facility];  We plan recheck in 4-64mo...  ~  July 15, 2017:  14mo ROV & Salle indicates that "I'm doing good" on Anoro one inhalation daily + Neb vs rescue inhaler as needed;  She notes sl cough, min amt clear sput (no color & no blood), and SOB/DOE the same as before (no change she says); she is still NOT  exercising & claims that Pulm Rehab never called her (and she never called [place] back to tell us so);  She has a f/u appt w/ drMohamed this month w/ CT Chest planned...  We reviewed the following interval notes avail to [place] in [place] Everywhere>      She saw PCP- DrBouska on 04/30/17> medical f/u w/ hx recent GIB- seen by [facility] & sched for colonoscopy; hx CAD- s/p CABGx3, HBP, HL; note reviewed- given 2018 Flu vaccine...     She saw GI- DrHung on 05/03/17 for colonoscopy>  Normal colonoscopy- no lesions seen, prev colon done in 2016 showed 4 adenomas...  We reviewed the following medical problems during today's office visit >>     COPD, GOLD Stage 2 disease (w/ revers component)>  She wants to blame her 6/18 LLL pneumonia on her nebulizer & Advair=> refuses to use these going forward;  We discussed using the Duoneb prn and trying ANORO one inhalation daily;  She is still too sedentary & NOT motivated to exercise- needs referral to [place] program & she is encouraged to participate!    RLL Pulm nodule=> Bx 04/11/16= Squamous cell ca & pt eval by DrBartle, DrMohamed, DrManning w/ SBRT given 05/2016 (54 Gy in 3 fractions); Last seen by DrMohamed 01/2017- Stage 1A non-small-cell lung ca, RLL nodule s/p XRT, & persistent anemia=> given Feraheme 06/2016;  CT Chest f/u 6/18 showed stable size of nodule ~7-31mm, no sign mets; DrM rec f/u scan in 57mo...    Ex-smoker-- quit 2002, 45+pack-year smoking hx    CAP 3/17 & 6/18-- NOS, 1st resolved w/ Levaquin, Doxy, but she claims allergic to everything; 2nd resolved w/ Augmentin, SeptraDS; she also had bronchitis 05/2016 treated w/ Augmentiun & improved...    Cardiac issues>  HBP, CAD- s/p 3 vessel CABG in 2002, severe ASPVD w/ bilat SFA stents 2003 & left CAE 2011, 65mm right MCA aneurysm-- followed by Elizabeth Sauer, [facility]- DrDickson (07/2016), IR-DrDeveshwar... On ASA/ Plavix, Azor, Catapres, Aldactone, Crestor; CDoppler 07/2016 showed 40-59% R-ICA stenosis & patent  L-CAE site w/o restenosis...    Medical issues>  HBP, HL, borderline diabetes, HH/GERD (on Dexilant)/ Divertics, colon polyps, HAs (s/p craniotomy for Arnold-Chiari malformation), Anemia-- followed by DrBoushka & DrWillis... EXAM shows Afeb, VSS, O2sat=99% on RA; Wt=157#, 5'2"Tall, BMI=29-30;  HEENT- neg, mallampati1, bilat CBruits & L-CAE scar;  Chest- decrBS, clear w/o w/r/r;  Heart- CABG scar, RR Gr1-2/6 SEM w/o r/g;  Abd- +epig bruit, w/o masses;  Ext- w/o c/ce;  Neuro- w/o focal deficits...  IMP/PLAN>>  Jalisa is stable fom the pulmonary standpoint; she has not been exercising & is still in need of a regular exercise program- refer  to Pulm Rehab at [facility];  Rec to continue Anoro regularly & AlbuterolHFA as needed...   ~  ADDENDUM>>  CT CHEST 08/22/17>  norm heart size, s/p median sternotomy & CABG, aortic & coronary calcif; no adenopathy, resolved LLL airspace dis, GG attenuation w/ nodularity in RLL has 27mm nodule (prev 27mm) stable partially calcif nodule along R-minor fissure, biapical pleuroparenchymal scarring... They plan recheck in 59mo.   ~  February 10, 2018:  70mo ROV & Thara complains that her breathing is worse & she is more SOB;  Notes mild cough, small amt clear sput, no hemoptysis; notes DOE w/ ADLs, houswork, ambulation; she is not exercising and prev attempts at getting her into [facility] were unsuccessful- she says classes were full but the issue was the expense involved... She is stable on her regimen of NEBS "prn" and AlbutHFA prn when out & about, ANORO one inhalation daily... We reviewed the following interval notes in Epic-EMR>      She saw [facility]- DrDickson on 08/14/17>  Hx bilat iliac art stents, prev L-CAE (2011); mult somatic complaints w/ HA, back pain, leg pain, etc but NOT felt to have any claudication; Iliac stent duplex eval showed intact bilat common iliac art stents and Left ext iliac art stent (some stenosis noted prox to this stent), ABIs are good 95-100%;  Carotid  Duplex showed L-CAE site is widely patent, but Right carotid shows prev 40-59% stenosis has progressed to 60-79% on this study (no intervention planned until >80%; they plan recheck in 37mo;  She was again asked to incr exercise program...    She had f/u w/ ONCOLOGY- Dr Julien Nordmann on 08/26/17>  S/p 1A non-small cell lung cancer (Squamous cell) in RLL (treated w/ sterotactic radiotherapy to RLL by DrManning), and hx IDA/ anemia of chr dis (treated w. Feraheme infusions 06/2016);  Follow up Chest CT 08/22/17 as above- NAD, RLL nodular opac is smaller than prev- rec f/u 9mo...     She had numerous f/u visits w/ PCP- DrBouska on 09/27/17 & 11/26/17>  Seen for cough plus upper resp congestion, rhinorrhea, coryza, Flu-neg, treated w/ empiric Amox and she reported resolved;  Also had left shoulder pain & was evaluation & given a subacromial bursa injection...     She saw CARDSTedra [place] in [id number]>  Hx CAD w/ PTCA & stent to LAD & 1st diagonal in past; then she had 3 vessel CABG in 2002; known ASPVD w/ iliac stenting & L-CAE by [facility] as well; c/o 5/10 CP w/ nausea & sweating, had abn myoview and subseq CATH showing 30-40% Lmain, 70% in-stent LAD, LIMA to LAD was patent, occluded 1st diagonal stent, 40% Circ, saphanous vein graft to OM & RCA are occluded=> med management per [facility]... We reviewed the following medical problems during today's office visit >>     COPD, GOLD Stage 2-3 disease (w/ a revers component)>  She wants to blame her 6/18 LLL pneumonia on her nebulizer & Advair=> refuses to use these going forward;  We discussed using the Duoneb prn and trying ANORO one inhalation daily;  She is still too sedentary & NOT motivated to exercise- needs referral to [place] program & she is encouraged to participate!    RLL Pulm nodule=> Bx 04/11/16= Squamous cell ca & pt eval by DrBartle, DrMohamed, DrManning w/ SBRT given 05/2016 (54 Gy in 3 fractions); Last seen by DrMohamed 61/2019- Stage 1A non-small-cell lung ca, RLL  nodule s/p XRT, & persistent anemia=> given Feraheme 06/2016;  CT  Chest f/u 01/28/2017 & 08/22/2017 showed sl smaller size of nodule ~7-65mm, no sign mets; DrM rec continued follow up...    Ex-smoker-- quit 2002, 45+pack-year smoking hx    CAP 3/17 & 6/18-- NOS, 1st resolved w/ Levaquin, Doxy, but she claims allergic to everything; 2nd resolved w/ Augmentin, SeptraDS; she also had bronchitis 05/2016 treated w/ Augmentiun & improved...    Cardiac issues>  HBP, CAD- s/p 3 vessel CABG in 2002, severe ASPVD w/ bilat SFA stents 2003 & left CAE 2011, 67mm right MCA aneurysm-- followed by Elizabeth Sauer, VVS- DrDickson (07/2016), IR-DrDeveshwar... On ASA/ Plavix, Azor 5/40, Aldactone25, Crestor10; CDoppler 08/2017 showed 60-79% R-ICA stenosis & patent L-CAE site w/o restenosis; they plan f/u 39mo due to the progression on the right...    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 EXAM shows Afeb, VSS, O2sat=99% on RA; Wt=156#, 5'2"Tall, BMI=29-30;  HEENT- neg, mallampati1, bilat CBruits & L-CAE scar;  Chest- decrBS, clear w/o w/r/r;  Heart- CABG scar, RR Gr1-2/6 SEM w/o r/g;  Abd- +epig bruit, w/o masses;  Ext- w/o c/ce;  Neuro- w/o focal deficits...   Labs 08/2017 showed> Chems- ok w/ K=5.2, BS=100=3, Cr=1.41, LFTs wnl;  CBC w/ Hg=10.1, mcv=93, WBC=6.9 w/ norm diff & 4% eos;   IMP/PLAN>>  Kaeley is recommended to use her NEBULIZER w/ Duoneb 2-3x/d regularly & continue her ANORO once daily;  She desperately needs to consider something like PulmRehab but she is clearly not motivated & declines to pursue this- she cancelled several appts w/ them & says it was too $$ (alternatively asked to get into CARDIAC rehab w/ [facility]);  She will maintain her regular follow up appts w/ PCP- DrBouska, CARDS-  DrHarwani, ONOLOGY- DrMohamed, & VVS- DrDickson... We plan rov recheck in 50mo, sooner if needed for breathing problems...     Past Medical History:  Diagnosis Date  . 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 Arnold-Chiari malformation ([place]) 1998  . Asthma   . 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  . Blood in stool    last week of aug 2018  . Carotid artery occlusion   . Complication of anesthesia   . COPD (chronic obstructive pulmonary disease) ([place])   . Coronary artery disease   . 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 Diverticulitis   . Dyspnea    with exertion  . Gastroesophageal reflux disease    occ  . Glaucoma    right eye  . Headache    tension  . Hiatal hernia   . Hyperlipidemia   . Hypertension   . Iliac artery aneurysm ([place])   . 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 Myocardial infarction [facility]) 01/01/2000   Cardiac catheterization  . Peripheral vascular disease ([place])    stents in legs x 2 or 3  . Pneumonia    last time winter 2017 -2018  . PONV (postoperative nausea and vomiting)    occassionally, last colonscopy did ok with anesthesia  . Reflux     Past Surgical History:  Procedure Laterality Date  . ABDOMINAL HYSTERECTOMY    . APPENDECTOMY    . Arnold-chiari malformation repair  1998   Suboccipital craniectomy  . CAROTID ENDARTERECTOMY  03/29/2010   Left  CEA  .  CHOLECYSTECTOMY     Gall Bladder  . COLONOSCOPY WITH PROPOFOL N/A 04/22/2015   Procedure: COLONOSCOPY WITH PROPOFOL;  Surgeon: Carol Ada, MD;  Location: [facility] ENDOSCOPY;  Service: Endoscopy;  Laterality: N/A;  . COLONOSCOPY WITH PROPOFOL N/A 05/25/2016   Procedure: COLONOSCOPY WITH PROPOFOL;  Surgeon: Carol Ada, MD;  Location: [facility] ENDOSCOPY;  Service: Endoscopy;  Laterality: N/A;  . COLONOSCOPY WITH PROPOFOL N/A 05/03/2017   Procedure: COLONOSCOPY WITH PROPOFOL;  Surgeon: Carol Ada, MD;  Location: [facility]  ENDOSCOPY;  Service: Endoscopy;  Laterality: N/A;  . CORNEAL TRANSPLANT     Right  . CORONARY ARTERY BYPASS GRAFT  01/01/2000   x 3  . ESOPHAGOGASTRODUODENOSCOPY N/A 05/25/2016   Procedure: ESOPHAGOGASTRODUODENOSCOPY (EGD);  Surgeon: Carol Ada, MD;  Location: [facility];  Service: Endoscopy;  Laterality: N/A;  . EYE SURGERY Right 1995 or 1996   Laser surgery for retinal hemorrhage  . IR RADIOLOGIST EVAL & MGMT  12/14/2016  . LEFT HEART CATH AND CORS/GRAFTS ANGIOGRAPHY N/A 01/09/2018   Procedure: LEFT HEART CATH AND CORS/GRAFTS ANGIOGRAPHY;  Surgeon: Charolette Forward, MD;  Location: [facility] CV LAB;  Service: Cardiovascular;  Laterality: N/A;  . LEFT HEART CATHETERIZATION WITH CORONARY ANGIOGRAM N/A 08/03/2014   Procedure: LEFT HEART CATHETERIZATION WITH CORONARY ANGIOGRAM;  Surgeon: Birdie Riddle, MD;  Location: [facility] CATH LAB;  Service: Cardiovascular;  Laterality: N/A;  . Post Coronary Artery  BPG  01/05/2000   Right jugular sheath removed  . PR VEIN BYPASS GRAFT,AORTO-FEM-POP    . ROTATOR CUFF REPAIR     Right    Outpatient Encounter Medications as of 02/10/2018  Medication Sig  . acetaminophen (TYLENOL) 500 MG tablet Take 1,000 mg by mouth every 6 (six) hours as needed for moderate pain.   Marland Kitchen albuterol (VENTOLIN HFA) 108 (90 BASE) MCG/ACT inhaler Inhale 2 puffs into the lungs every 4 (four) hours as needed for wheezing or shortness of breath (((PLAN B))).  Marland Kitchen amLODipine-olmesartan (AZOR) 5-40 MG tablet Take 1 tablet by mouth daily.  Marland Kitchen aspirin 81 MG chewable tablet Chew 81 mg by mouth daily.  . cetirizine HCl (ZYRTEC) 5 MG/5ML SOLN Take 5 mg by mouth 2 (two) times daily as needed for allergies.  Marland Kitchen clopidogrel (PLAVIX) 75 MG tablet Take 1 tablet (75 mg total) by mouth daily.  Marland Kitchen dexlansoprazole (DEXILANT) 60 MG capsule Take 1 capsule (60 mg total) by mouth daily before breakfast.  . dicyclomine (BENTYL) 10 MG capsule Take 1 capsule (10 mg total) by mouth 4 (four) times daily -  before meals  and at bedtime.  . nitroGLYCERIN (NITROSTAT) 0.4 MG SL tablet Place 0.4 mg under the tongue every 5 (five) minutes as needed for chest pain.   . prednisoLONE acetate (PRED FORTE) 1 % ophthalmic suspension Place 1 drop into the right eye every other day.   . rosuvastatin (CRESTOR) 10 MG tablet Take 10 mg by mouth at bedtime.   Marland Kitchen spironolactone (ALDACTONE) 25 MG tablet Take 25 mg by mouth daily.  Marland Kitchen umeclidinium-vilanterol (ANORO ELLIPTA) 62.5-25 MCG/INH AEPB Inhale 1 puff into the lungs daily.   No facility-administered encounter medications on file as of 02/10/2018.     Allergies  Allergen Reactions  . Hydromorphone Palpitations and Other (See Comments)    DILAUDID  -  Pt had a Heart Attack after taking Dilaudid.  . Levaquin [Levofloxacin] Other (See Comments)    Chest pressure, SOB, "pain in between shoulder blades", sweaty -as reported by patient per experience in ED this afternoon  .  Azithromycin     Patient reported past history of lip swelling  . Codeine Other (See Comments)    Dr. Terrence Dupont advised patient not to take this medication  . Doxycycline Swelling    Mouth, lips, feet swelling  . Oxycodone-Acetaminophen Other (See Comments)    Says it makes her feel weird  . Risedronate Other (See Comments)    Chest pain  . Avelox [Moxifloxacin Hcl In Nacl] Palpitations    Immunization History  Administered Date(s) Administered  . Influenza Split 05/15/2012  . Influenza Whole 05/13/2012  . Influenza, High Dose Seasonal PF 05/22/2013, 07/13/2014  . Influenza,inj,Quad PF,6+ Mos 05/15/2012, 04/14/2015, 05/18/2015, 04/10/2016, 04/30/2017  . Pneumococcal Conjugate-13 05/15/2012  . Pneumococcal Polysaccharide-23 11/30/2014    Current Medications, Allergies, Past Medical History, Past Surgical History, Family History, and Social History were reviewed in [employer] record.   Review of Systems             All symptoms NEG except where BOLDED >>  Constitutional:   F/C/S, fatigue, anorexia, unexpected weight change. HEENT:  HA, visual changes, hearing loss, earache, nasal symptoms, sore throat, mouth sores, hoarseness. Resp:  cough, sputum, hemoptysis; SOB, tightness, wheezing. Cardio:  CP, palpit, DOE, orthopnea, edema. GI:  N/V/D/C, blood in stool; reflux, abd pain, distention, gas. GU:  dysuria, freq, urgency, hematuria, flank pain, voiding difficulty. MS:  joint pain, swelling, tenderness, decr ROM; neck pain, back pain, etc. Neuro:  HA, tremors, seizures, dizziness, syncope, weakness, numbness, gait abn. Skin:  suspicious lesions or skin rash. Heme:  adenopathy, bruising, bleeding. Psyche:  confusion, agitation, sleep disturbance, hallucinations, anxiety, depression suicidal.   Objective:   Physical Exam       Vital Signs:  Reviewed...   General:  WD, overweight, 77 y/o WF in NAD; alert & oriented; pleasant & cooperative... HEENT:  [place]/AT; Conjunctiva- pink, Sclera- nonicteric, EOM-wnl, PERRLA, Fundi-benign; EACs-clear, TMs-wnl; NOSE-clear; THROAT-clear & wnl.  Neck:  Supple w/ decr ROM; no JVD; s/p L-CAE, bilat bruits; no thyromegaly or nodules palpated; no lymphadenopathy.  Chest:  decr BS, clear to P & A; without wheezes, rales, or rhonchi heard. Heart:  Regular Rhythm; norm S1 & S2, Gr1-2/6 SEM, w/o rubs or gallops detected. Abdomen:  Soft & nontender- no guarding or rebound; normal bowel sounds; no organomegaly or masses palpated. Ext:  decrROM; without deformities +arthritic changes; no varicose veins, +venous insuffic, no edema;  Pulses intact w/o bruits. Neuro:  No focal neuro deficits, +gait abn... Derm:  No lesions noted; no rash etc. Lymph:  No cervical, supraclavicular, axillary, or inguinal adenopathy palpated.   Assessment:      IMP >>    COPD, GOLD Stage 2 disease>  Back to baseline on her NEB w/ Duoneb Qid, Dulera100-2spBid, AlbutHFA rescue as needed; Rec to continue same + gradual exercise program & work on wt  reduction...    RLL pulm nodule=> Squamous cell ca (Bx 03/2016), s/p XRT 05/2016, [facility] & DrMohamed...    Ex-smoker-- quit 2002, 45+pack-year smoking hx    CAP 3/17-- NOS, resolved w/ Levaquin, Doxy, but she claims allergic to everything...    Cardiac issues>  HBP, CAD- s/p 3 vessel CABG in 2002, severe ASPVD w/ left CAE 2011, 72mm right MCA aneurysm-- followed by Elizabeth Sauer, VVS- DrDickson, IR-DrDeveshwar...    Medical issues>  HBP, HL, borderline diabetes, HH/GERD/ Divertics, colon polyps, HAs (s/p craniotomy for Arnold-Chiari malformation), Anemia-- followed by DrBoushka & DrWillis...  PLAN >>  10/28/15>   We discussed her  COPD and recent hosp for CAP;  She is asked to use the NEBULIZER w/ DUONEB Tid regularly, DULERA100-2spBid, and MUCINEX 600mg  1-2tabs bid w/ fluids... She will maintain regular follow up w/ her numerous physicians and plan pulm recheck in 6-8 wks. 12/12/15>   Shelda is approaching her baseline, s/p pneumonia 10/2015;  Asked to continue NEBs Tid-Qid, Dulera Bid, & rescue inhaler as needed;  She will maintain general med follow up w/ DrBouska, Cards w/ DrHarwani, VVS- DrDickson, and Neuro- DrWillis;  We plan ROV recheck 3 months 06/18/16>  Kelbi has been thru a lot but improved after SBRT for RLL squamous cell ca, recent Feraheme for anemia, and Augmentin for bronchitis; she is encouraged to stay on the [facility] Bid & use the NEBS as needed; definitely needs to incr physical activity & get her weight down... we plan rov in 14mo for pulm check... 11/08/16>   REC to use her NEB (Duoneb) Tid followed by [id number]- 2spBid and start a regular exercise program; continue follow up w/ her PCP-DrBouska & mult specialty physicians... 03/14/17>   Hoyle Sauer refuses NEBs and Advair-- try ANORO one inhalation daily & encouraged to use the NEB vs rescue inhaler prn;  She desperately need to incr exercise program & can't vs won't do it on her own-- refer to [place] at [facility];  We plan  recheck in 4-55mo... 07/15/17>   Carlia is stable fom the pulmonary standpoint; she has not been exercising & is still in need of a regular exercise program- refer to [place] at [facility];  Rec to continue Anoro regularly & AlbuterolHFA as needed. 02/10/18>   Aldeen is recommended to use her NEBULIZER w/ Duoneb 2-3x/d regularly & continue her ANORO once daily;  She desperately needs to consider something like PulmRehab but she is clearly not motivated & declines to pursue this- she cancelled several appts w/ them & says it was too $$ (alternatively asked to get into CARDIAC rehab w/ [facility]);  She will maintain her regular follow up appts w/ PCP- DrBouska, CARDS- DrHarwani, ONOLOGY- DrMohamed, & VVS- DrDickson... We plan rov recheck in 2mo, sooner if needed for breathing problems   Plan:     Patient's Medications  New Prescriptions   No medications on file  Previous Medications   ACETAMINOPHEN (TYLENOL) 500 MG TABLET    Take 1,000 mg by mouth every 6 (six) hours as needed for moderate pain.    ALBUTEROL (VENTOLIN HFA) 108 (90 BASE) MCG/ACT INHALER    Inhale 2 puffs into the lungs every 4 (four) hours as needed for wheezing or shortness of breath (((PLAN B))).   AMLODIPINE-OLMESARTAN (AZOR) 5-40 MG TABLET    Take 1 tablet by mouth daily.   ASPIRIN 81 MG CHEWABLE TABLET    Chew 81 mg by mouth daily.   CETIRIZINE HCL (ZYRTEC) 5 MG/5ML SOLN    Take 5 mg by mouth 2 (two) times daily as needed for allergies.   CLOPIDOGREL (PLAVIX) 75 MG TABLET    Take 1 tablet (75 mg total) by mouth daily.   DEXLANSOPRAZOLE (DEXILANT) 60 MG CAPSULE    Take 1 capsule (60 mg total) by mouth daily before breakfast.   DICYCLOMINE (BENTYL) 10 MG CAPSULE    Take 1 capsule (10 mg total) by mouth 4 (four) times daily -  before meals and at bedtime.   NITROGLYCERIN (NITROSTAT) 0.4 MG SL TABLET    Place 0.4 mg under the tongue every 5 (five) minutes as needed for chest pain.  PREDNISOLONE ACETATE (PRED FORTE) 1 % OPHTHALMIC  SUSPENSION    Place 1 drop into the right eye every other day.    ROSUVASTATIN (CRESTOR) 10 MG TABLET    Take 10 mg by mouth at bedtime.    SPIRONOLACTONE (ALDACTONE) 25 MG TABLET    Take 25 mg by mouth daily.   UMECLIDINIUM-VILANTEROL (ANORO ELLIPTA) 62.5-25 MCG/INH AEPB    Inhale 1 puff into the lungs daily.  Modified Medications   No medications on file  Discontinued Medications   No medications on file

## 2018-02-10 NOTE — Patient Instructions (Signed)
Today we updated your med list in our EPIC system...    Continue your current medications the same...  We discussed re-starting the NEBULIZER w/ Duoneb medication 7 using it 2-3 times every day...  Continue the [facility] once daily...  REMEMBER the importance of a regular exercise program-- reconsider the PULM rehab program, or ask DrHarwani to refer you to the CARDIAC rehab program...  Call for any questions...  Let's plan a follow up visit in 5-30mo, sooner if needed for problems.Marland KitchenMarland Kitchen

## 2018-02-12 ENCOUNTER — Other Ambulatory Visit: Payer: Self-pay

## 2018-02-12 ENCOUNTER — Ambulatory Visit (HOSPITAL_COMMUNITY)
Admission: RE | Admit: 2018-02-12 | Discharge: 2018-02-12 | Disposition: A | Discharge status: Home / Self Care | Payer: Medicare Other | Source: Ambulatory Visit | Attending: Vascular Surgery | Admitting: Vascular Surgery

## 2018-02-12 ENCOUNTER — Ambulatory Visit (INDEPENDENT_AMBULATORY_CARE_PROVIDER_SITE_OTHER): Payer: Medicare Other | Admitting: Vascular Surgery

## 2018-02-12 ENCOUNTER — Ambulatory Visit (INDEPENDENT_AMBULATORY_CARE_PROVIDER_SITE_OTHER)
Admission: RE | Admit: 2018-02-12 | Discharge: 2018-02-12 | Disposition: A | Discharge status: Home / Self Care | Payer: Medicare Other | Source: Ambulatory Visit | Attending: Vascular Surgery | Admitting: Vascular Surgery

## 2018-02-12 ENCOUNTER — Encounter: Payer: Self-pay | Admitting: Vascular Surgery

## 2018-02-12 VITALS — BP 133/67 | HR 63 | Temp 96.8°F | Resp 16 | Ht 62.0 in | Wt 155.0 lb

## 2018-02-12 DIAGNOSIS — I771 Stricture of artery: Secondary | ICD-10-CM

## 2018-02-12 DIAGNOSIS — I6523 Occlusion and stenosis of bilateral carotid arteries: Secondary | ICD-10-CM

## 2018-02-12 NOTE — Progress Notes (Signed)
Patient name: [name] MRN: [id number] DOB: August 07, 1941 Sex: female  REASON FOR VISIT:   Follow-up of carotid disease and iliac disease.  HPI:   [name] is a pleasant 77 y.o. female who I last saw on 08/14/2017.  The patient has had a previous left carotid endarterectomy.  This was in August 2011.  She is also had bilateral iliac stents.  She comes in for a six-month follow-up visit.  At the time of her last visit, she had no evidence of recurrent carotid stenosis on the left.  She had a 60 to 79% right carotid stenosis.  This was asymptomatic.  Her iliac stents were patent with palpable pedal pulses.  Since I saw her last, she denies any significant claudication.  She denies rest pain in her legs.  Her activity is limited by her shortness of breath.  She does have a history of lung cancer.  Her most recent follow-up study showed a stable exam with no new or progressive findings on her CT of the chest.  The patient denies any history of stroke, TIAs, expressive or receptive aphasia, or amaurosis fugax.  Past Medical History:  Diagnosis Date  . 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 Arnold-Chiari malformation ([place]) 1998  . Asthma   . 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  . Blood in stool    last week of aug 2018  . Carotid artery occlusion   . Complication of anesthesia   . COPD (chronic obstructive pulmonary disease) ([place])   . Coronary artery disease   . 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 Diverticulitis   . Dyspnea    with exertion  . Gastroesophageal reflux disease    occ  . Glaucoma    right eye  . Headache    tension  . Hiatal hernia   . Hyperlipidemia   . Hypertension   . Iliac artery aneurysm ([place])   . Lung cancer ([place]) dx 2018   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  . Myocardial infarction [facility]) 01/01/2000   Cardiac catheterization  . Peripheral vascular disease ([place])    stents in legs x 2  or 3  . Pneumonia    last time winter 2017 -2018  . PONV (postoperative nausea and vomiting)    occassionally, last colonscopy did ok with anesthesia  . Reflux     Family History  Problem Relation Age of Onset  . Heart disease Mother        Heart Disease before age 67  . Hypertension Mother   . Hyperlipidemia Mother   . Heart attack Mother   . Clotting disorder Mother   . Heart disease Father        Heart Disease before age 54  . Heart attack Father   . Hyperlipidemia Father   . Hypertension Father   . Heart disease Brother        Heart Disease before age 45  . Hyperlipidemia Brother   . Hypertension Brother   . Clotting disorder Brother   . AAA (abdominal aortic aneurysm) Brother   . Cerebral aneurysm Sister   . Hypertension Sister   . AAA (abdominal aortic aneurysm) Sister   . Asthma Sister   . Cerebral aneurysm Brother   . Cancer Brother        Lung  . Hypertension Brother   . Heart attack Brother   . Heart disease Brother  Aneurysm of Brain  . Hypertension Brother   . Heart disease Brother   . Heart disease Brother   . Stroke Son        Aneurysm of Stomach  . AAA (abdominal aortic aneurysm) Son   . Cancer Maternal Uncle        great uncle/cancer/type unknown    SOCIAL HISTORY: She quit smoking in 2002. Social History   Tobacco Use  . Smoking status: Former Smoker    Packs/day: 1.50    Years: 30.00    Pack years: 45.00    Types: Cigarettes    Last attempt to quit: 08/13/2000    Years since quitting: 17.5  . Smokeless tobacco: Never Used  Substance Use Topics  . Alcohol use: No    Alcohol/week: 0.0 oz    Allergies  Allergen Reactions  . Hydromorphone Palpitations and Other (See Comments)    DILAUDID  -  Pt had a Heart Attack after taking Dilaudid.  . Levaquin [Levofloxacin] Other (See Comments)    Chest pressure, SOB, "pain in between shoulder blades", sweaty -as reported by patient per experience in ED this afternoon  . Azithromycin      Patient reported past history of lip swelling  . Codeine Other (See Comments)    Dr. Terrence Dupont advised patient not to take this medication  . Doxycycline Swelling    Mouth, lips, feet swelling  . Oxycodone-Acetaminophen Other (See Comments)    Says it makes her feel weird  . Risedronate Other (See Comments)    Chest pain  . Avelox [Moxifloxacin Hcl In Nacl] Palpitations    Current Outpatient Medications  Medication Sig Dispense Refill  . acetaminophen (TYLENOL) 500 MG tablet Take 1,000 mg by mouth every 6 (six) hours as needed for moderate pain.     Marland Kitchen albuterol (VENTOLIN HFA) 108 (90 BASE) MCG/ACT inhaler Inhale 2 puffs into the lungs every 4 (four) hours as needed for wheezing or shortness of breath (((PLAN B))). 1 Inhaler 11  . amLODipine-olmesartan (AZOR) 5-40 MG tablet Take 1 tablet by mouth daily.    Marland Kitchen aspirin 81 MG chewable tablet Chew 81 mg by mouth daily.    . cetirizine HCl (ZYRTEC) 5 MG/5ML SOLN Take 5 mg by mouth 2 (two) times daily as needed for allergies.    Marland Kitchen clopidogrel (PLAVIX) 75 MG tablet Take 1 tablet (75 mg total) by mouth daily. 90 tablet 3  . dexlansoprazole (DEXILANT) 60 MG capsule Take 1 capsule (60 mg total) by mouth daily before breakfast. 30 capsule 11  . dicyclomine (BENTYL) 10 MG capsule Take 1 capsule (10 mg total) by mouth 4 (four) times daily -  before meals and at bedtime. 120 capsule 1  . nitroGLYCERIN (NITROSTAT) 0.4 MG SL tablet Place 0.4 mg under the tongue every 5 (five) minutes as needed for chest pain.     . prednisoLONE acetate (PRED FORTE) 1 % ophthalmic suspension Place 1 drop into the right eye every other day.     . rosuvastatin (CRESTOR) 10 MG tablet Take 10 mg by mouth at bedtime.     Marland Kitchen spironolactone (ALDACTONE) 25 MG tablet Take 25 mg by mouth daily.    Marland Kitchen umeclidinium-vilanterol (ANORO ELLIPTA) 62.5-25 MCG/INH AEPB Inhale 1 puff into the lungs daily. 3 each 2  . amLODipine-olmesartan (AZOR) 10-40 MG tablet     . nitroGLYCERIN (NITRODUR -  DOSED IN MG/24 HR) 0.2 mg/hr patch   3  . ondansetron (ZOFRAN) 4 MG tablet  No current facility-administered medications for this visit.     REVIEW OF SYSTEMS:  [X]  denotes positive finding, [ ]  denotes negative finding Cardiac  Comments:  Chest pain or chest pressure:    Shortness of breath upon exertion: x   Short of breath when lying flat: x   Irregular heart rhythm:        Vascular    Pain in calf, thigh, or hip brought on by ambulation: x   Pain in feet at night that wakes you up from your sleep:     Blood clot in your veins:    Leg swelling:  x       Pulmonary    Oxygen at home:    Productive cough:     Wheezing:         Neurologic    Sudden weakness in arms or legs:     Sudden numbness in arms or legs:     Sudden onset of difficulty speaking or slurred speech:    Temporary loss of vision in one eye:     Problems with dizziness:  x       Gastrointestinal    Blood in stool:     Vomited blood:         Genitourinary    Burning when urinating:     Blood in urine:        Psychiatric    Major depression:         Hematologic    Bleeding problems:    Problems with blood clotting too easily:        Skin    Rashes or ulcers:        Constitutional    Fever or chills:     PHYSICAL EXAM:   Vitals:   02/12/18 1037  BP: 133/67  Pulse: 63  Resp: 16  Temp: (!) 96.8 F (36 C)  TempSrc: Oral  SpO2: 100%  Weight: 155 lb (70.3 kg)  Height: 5\' 2"  (1.575 m)    GENERAL: The patient is a well-nourished female, in no acute distress. The vital signs are documented above. CARDIAC: There is a regular rate and rhythm.  VASCULAR: She has a right carotid bruit. She has palpable femoral pulses and palpable dorsalis pedis pulses bilaterally. PULMONARY: There is good air exchange bilaterally without wheezing or rales. ABDOMEN: Soft and non-tender with normal pitched bowel sounds.  MUSCULOSKELETAL: There are no major deformities or cyanosis. NEUROLOGIC: No focal  weakness or paresthesias are detected. SKIN: There are no ulcers or rashes noted. PSYCHIATRIC: The patient has a normal affect.  DATA:    CAROTID DUPLEX: I interpreted her carotid duplex scan today.  On the left side her left carotid endarterectomy site is widely patent without evidence of restenosis.  On the right side she has a 60 to 79% stenosis.    Both vertebral arteries are patent with antegrade flow.  ARTERIAL DOPPLER STUDY: I have independently interpreted her arterial Doppler study today.    On the right side there is a triphasic dorsalis pedis and posterior tibial signal with an ABI of 98%.  Toe pressure is 100 mmHg.  On the left side there is a triphasic dorsalis pedis and posterior tibial signal.  ABI is 97%.  Toe pressure on the left is 102 mmHg.    MEDICAL ISSUES:   ASYMPTOMATIC 60 TO 79% RIGHT CAROTID STENOSIS: She has a stable 60 to 79% right carotid stenosis.  She is asymptomatic.  She understands we would not consider  carotid endarterectomy unless the stenosis progressed to greater than 80% or she develop new focal right hemispheric symptoms.  I have ordered a follow-up duplex scan in 6 months and I will see her back at that time.  Of note she is on aspirin, a statin, and Plavix.  STATUS POST BILATERAL ILIAC STENTS: Patient has no significant lower extremity symptoms although her activity is limited by her shortness of breath.  She has palpable femoral and pedal pulses.  I have ordered follow-up ABIs in 1 year.  I have encouraged her to stay as active as possible.  Deitra Mayo [facility] [phone]

## 2018-02-14 ENCOUNTER — Ambulatory Visit (INDEPENDENT_AMBULATORY_CARE_PROVIDER_SITE_OTHER): Payer: Medicare Other | Admitting: Pulmonary Disease

## 2018-02-14 DIAGNOSIS — J449 Chronic obstructive pulmonary disease, unspecified: Secondary | ICD-10-CM | POA: Diagnosis not present

## 2018-02-14 DIAGNOSIS — R0602 Shortness of breath: Secondary | ICD-10-CM

## 2018-02-14 LAB — PULMONARY FUNCTION TEST
FEF 25-75 PRE: 0.89 L/s
FEF 25-75 Post: 1.26 L/sec
FEF2575-%Change-Post: 42 %
FEF2575-%PRED-POST: 81 %
FEF2575-%PRED-PRE: 57 %
FEV1-%CHANGE-POST: 9 %
FEV1-%PRED-POST: 74 %
FEV1-%Pred-Pre: 67 %
FEV1-POST: 1.43 L
FEV1-PRE: 1.3 L
FEV1FVC-%CHANGE-POST: -1 %
FEV1FVC-%Pred-Pre: 96 %
FEV6-%Change-Post: 9 %
FEV6-%PRED-POST: 80 %
FEV6-%Pred-Pre: 73 %
FEV6-POST: 1.96 L
FEV6-Pre: 1.79 L
FEV6FVC-%CHANGE-POST: 0 %
FEV6FVC-%Pred-Post: 105 %
FEV6FVC-%Pred-Pre: 105 %
FVC-%CHANGE-POST: 10 %
FVC-%Pred-Post: 77 %
FVC-%Pred-Pre: 69 %
FVC-Post: 1.99 L
FVC-Pre: 1.8 L
POST FEV1/FVC RATIO: 72 %
POST FEV6/FVC RATIO: 100 %
Pre FEV1/FVC ratio: 72 %
Pre FEV6/FVC Ratio: 100 %

## 2018-02-14 NOTE — Progress Notes (Signed)
PFT completed today. 02/14/18

## 2018-02-17 ENCOUNTER — Other Ambulatory Visit: Payer: Self-pay

## 2018-02-17 ENCOUNTER — Ambulatory Visit
Admission: RE | Admit: 2018-02-17 | Discharge: 2018-02-17 | Disposition: A | Discharge status: Home / Self Care | Payer: Medicare Other | Source: Ambulatory Visit | Attending: Family Medicine | Admitting: Family Medicine

## 2018-02-17 DIAGNOSIS — Z1231 Encounter for screening mammogram for malignant neoplasm of breast: Secondary | ICD-10-CM

## 2018-02-17 DIAGNOSIS — I6523 Occlusion and stenosis of bilateral carotid arteries: Secondary | ICD-10-CM

## 2018-02-17 DIAGNOSIS — Z9889 Other specified postprocedural states: Secondary | ICD-10-CM

## 2018-02-18 ENCOUNTER — Ambulatory Visit (HOSPITAL_COMMUNITY): Payer: Medicare Other

## 2018-02-21 ENCOUNTER — Inpatient Hospital Stay: Payer: Medicare Other | Attending: Internal Medicine

## 2018-02-21 DIAGNOSIS — R5383 Other fatigue: Secondary | ICD-10-CM | POA: Insufficient documentation

## 2018-02-21 DIAGNOSIS — C3490 Malignant neoplasm of unspecified part of unspecified bronchus or lung: Secondary | ICD-10-CM

## 2018-02-21 DIAGNOSIS — C3431 Malignant neoplasm of lower lobe, right bronchus or lung: Secondary | ICD-10-CM | POA: Diagnosis not present

## 2018-02-21 DIAGNOSIS — I10 Essential (primary) hypertension: Secondary | ICD-10-CM | POA: Insufficient documentation

## 2018-02-21 DIAGNOSIS — R634 Abnormal weight loss: Secondary | ICD-10-CM | POA: Insufficient documentation

## 2018-02-21 DIAGNOSIS — Z7982 Long term (current) use of aspirin: Secondary | ICD-10-CM | POA: Insufficient documentation

## 2018-02-21 DIAGNOSIS — R0609 Other forms of dyspnea: Secondary | ICD-10-CM | POA: Insufficient documentation

## 2018-02-21 DIAGNOSIS — Z923 Personal history of irradiation: Secondary | ICD-10-CM | POA: Insufficient documentation

## 2018-02-21 DIAGNOSIS — Z951 Presence of aortocoronary bypass graft: Secondary | ICD-10-CM | POA: Insufficient documentation

## 2018-02-21 DIAGNOSIS — I739 Peripheral vascular disease, unspecified: Secondary | ICD-10-CM | POA: Diagnosis not present

## 2018-02-21 DIAGNOSIS — I252 Old myocardial infarction: Secondary | ICD-10-CM | POA: Insufficient documentation

## 2018-02-21 DIAGNOSIS — Z7902 Long term (current) use of antithrombotics/antiplatelets: Secondary | ICD-10-CM | POA: Diagnosis not present

## 2018-02-21 DIAGNOSIS — R531 Weakness: Secondary | ICD-10-CM | POA: Diagnosis not present

## 2018-02-21 DIAGNOSIS — H409 Unspecified glaucoma: Secondary | ICD-10-CM | POA: Diagnosis not present

## 2018-02-21 DIAGNOSIS — D509 Iron deficiency anemia, unspecified: Secondary | ICD-10-CM | POA: Diagnosis not present

## 2018-02-21 DIAGNOSIS — E785 Hyperlipidemia, unspecified: Secondary | ICD-10-CM | POA: Diagnosis not present

## 2018-02-21 DIAGNOSIS — Z79899 Other long term (current) drug therapy: Secondary | ICD-10-CM | POA: Diagnosis not present

## 2018-02-21 DIAGNOSIS — K219 Gastro-esophageal reflux disease without esophagitis: Secondary | ICD-10-CM | POA: Diagnosis not present

## 2018-02-21 DIAGNOSIS — J449 Chronic obstructive pulmonary disease, unspecified: Secondary | ICD-10-CM | POA: Diagnosis not present

## 2018-02-21 LAB — CMP (CANCER CENTER ONLY)
ALBUMIN: 3.8 g/dL (ref 3.5–5.0)
ALK PHOS: 68 U/L (ref 38–126)
ALT: 9 U/L (ref 0–44)
ANION GAP: 8 (ref 5–15)
AST: 14 U/L — ABNORMAL LOW (ref 15–41)
BILIRUBIN TOTAL: 0.2 mg/dL — AB (ref 0.3–1.2)
BUN: 24 mg/dL — ABNORMAL HIGH (ref 8–23)
CALCIUM: 9.7 mg/dL (ref 8.9–10.3)
CO2: 23 mmol/L (ref 22–32)
Chloride: 108 mmol/L (ref 98–111)
Creatinine: 1.33 mg/dL — ABNORMAL HIGH (ref 0.44–1.00)
GFR, Est AFR Am: 44 mL/min — ABNORMAL LOW (ref >60)
GFR, Estimated: 38 mL/min — ABNORMAL LOW (ref >60)
GLUCOSE: 102 mg/dL — AB (ref 70–99)
POTASSIUM: 4.7 mmol/L (ref 3.5–5.1)
SODIUM: 139 mmol/L (ref 135–145)
TOTAL PROTEIN: 7.6 g/dL (ref 6.5–8.1)

## 2018-02-21 LAB — CBC WITH DIFFERENTIAL (CANCER CENTER ONLY)
BASOS ABS: 0.1 10*3/uL (ref 0.0–0.1)
Basophils Relative: 1 %
Eosinophils Absolute: 0.4 10*3/uL (ref 0.0–0.5)
Eosinophils Relative: 5 %
HEMATOCRIT: 26.6 % — AB (ref 34.8–46.6)
Hemoglobin: 8.9 g/dL — ABNORMAL LOW (ref 11.6–15.9)
LYMPHS PCT: 28 %
Lymphs Abs: 2 10*3/uL (ref 0.9–3.3)
MCH: 30.5 pg (ref 25.1–34.0)
MCHC: 33.6 g/dL (ref 31.5–36.0)
MCV: 90.6 fL (ref 79.5–101.0)
MONO ABS: 0.6 10*3/uL (ref 0.1–0.9)
Monocytes Relative: 8 %
NEUTROS ABS: 4 10*3/uL (ref 1.5–6.5)
Neutrophils Relative %: 58 %
Platelet Count: 279 10*3/uL (ref 145–400)
RBC: 2.93 MIL/uL — ABNORMAL LOW (ref 3.70–5.45)
RDW: 13.4 % (ref 11.2–14.5)
WBC Count: 7 10*3/uL (ref 3.9–10.3)

## 2018-02-24 ENCOUNTER — Inpatient Hospital Stay (HOSPITAL_BASED_OUTPATIENT_CLINIC_OR_DEPARTMENT_OTHER): Payer: Medicare Other | Admitting: Internal Medicine

## 2018-02-24 ENCOUNTER — Telehealth: Payer: Self-pay

## 2018-02-24 ENCOUNTER — Encounter: Payer: Self-pay | Admitting: Internal Medicine

## 2018-02-24 ENCOUNTER — Other Ambulatory Visit: Payer: Self-pay | Admitting: Gastroenterology

## 2018-02-24 VITALS — BP 157/38 | HR 80 | Temp 97.9°F | Resp 18 | Ht 62.0 in | Wt 153.7 lb

## 2018-02-24 DIAGNOSIS — Z923 Personal history of irradiation: Secondary | ICD-10-CM

## 2018-02-24 DIAGNOSIS — I10 Essential (primary) hypertension: Secondary | ICD-10-CM

## 2018-02-24 DIAGNOSIS — R634 Abnormal weight loss: Secondary | ICD-10-CM

## 2018-02-24 DIAGNOSIS — C3490 Malignant neoplasm of unspecified part of unspecified bronchus or lung: Secondary | ICD-10-CM

## 2018-02-24 DIAGNOSIS — H409 Unspecified glaucoma: Secondary | ICD-10-CM

## 2018-02-24 DIAGNOSIS — C3431 Malignant neoplasm of lower lobe, right bronchus or lung: Secondary | ICD-10-CM | POA: Diagnosis not present

## 2018-02-24 DIAGNOSIS — Z951 Presence of aortocoronary bypass graft: Secondary | ICD-10-CM

## 2018-02-24 DIAGNOSIS — D509 Iron deficiency anemia, unspecified: Secondary | ICD-10-CM

## 2018-02-24 DIAGNOSIS — I252 Old myocardial infarction: Secondary | ICD-10-CM

## 2018-02-24 DIAGNOSIS — J449 Chronic obstructive pulmonary disease, unspecified: Secondary | ICD-10-CM

## 2018-02-24 DIAGNOSIS — K219 Gastro-esophageal reflux disease without esophagitis: Secondary | ICD-10-CM

## 2018-02-24 DIAGNOSIS — Z7902 Long term (current) use of antithrombotics/antiplatelets: Secondary | ICD-10-CM

## 2018-02-24 DIAGNOSIS — I739 Peripheral vascular disease, unspecified: Secondary | ICD-10-CM

## 2018-02-24 DIAGNOSIS — Z7982 Long term (current) use of aspirin: Secondary | ICD-10-CM

## 2018-02-24 DIAGNOSIS — R5383 Other fatigue: Secondary | ICD-10-CM

## 2018-02-24 DIAGNOSIS — R531 Weakness: Secondary | ICD-10-CM | POA: Diagnosis not present

## 2018-02-24 DIAGNOSIS — Z79899 Other long term (current) drug therapy: Secondary | ICD-10-CM

## 2018-02-24 DIAGNOSIS — D539 Nutritional anemia, unspecified: Secondary | ICD-10-CM

## 2018-02-24 DIAGNOSIS — R0609 Other forms of dyspnea: Secondary | ICD-10-CM

## 2018-02-24 DIAGNOSIS — E785 Hyperlipidemia, unspecified: Secondary | ICD-10-CM

## 2018-02-24 NOTE — Progress Notes (Signed)
[place] [phone]   [phone]  OFFICE PROGRESS NOTE  [name], MD [address] [place] [address]  DIAGNOSIS:  1) stage IA non-small cell lung cancer, squamous cell carcinoma presented with right lower lobe pulmonary nodule. 2) persistent anemia questionable for anemia of chronic disease plus/minus iron deficiency.  PRIOR THERAPY:  1) Feraheme infusion last dose was given 06/20/2016. 2) stereotactic radiotherapy to the right lower lobe lung nodule under the care of Dr. Tammi Klippel.  CURRENT THERAPY: Observation.  INTERVAL HISTORY: [name] 77 y.o. female returns to the clinic today for six-month follow-up visit accompanied by her sister.  The patient continues to complain of increasing fatigue and weakness as well as shortness of breath with exertion.  She denied having any chest pain, cough or hemoptysis.  She denied having any bleeding issues.  She has no nausea, vomiting, diarrhea or constipation.  She lost few pounds recently.  She denied having any headache or visual changes.  She denied having any fever or chills.  She was seen recently by her gastroenterologist and she had colonoscopy and upper endoscopy under the care of Dr. Benson Norway.  She had repeat CT scan of the chest performed recently and she is here for evaluation and discussion of her risk her results.  MEDICAL HISTORY: Past Medical History:  Diagnosis Date  . 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 Asthma   . 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  . Blood in stool    last week of aug 2018  . Carotid artery occlusion   . Complication of anesthesia   . COPD (chronic obstructive pulmonary disease) ([place])   . Coronary artery disease   . 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 Diverticulitis   . Dyspnea    with exertion  . Gastroesophageal reflux disease    occ  .  Glaucoma    right eye  . Headache    tension  . Hiatal hernia   . Hyperlipidemia   . Hypertension   . Iliac artery aneurysm ([place])   . Lung cancer ([place]) dx 2018   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  . Myocardial infarction [facility]) 01/01/2000   Cardiac catheterization  . Peripheral vascular disease ([place])    stents in legs x 2 or 3  . Pneumonia    last time winter 2017 -2018  . PONV (postoperative nausea and vomiting)    occassionally, last colonscopy did ok with anesthesia  . Reflux     ALLERGIES:  is allergic to hydromorphone; levaquin [levofloxacin]; azithromycin; codeine; doxycycline; oxycodone-acetaminophen; risedronate; and avelox [moxifloxacin hcl in nacl].  MEDICATIONS:  Current Outpatient Medications  Medication Sig Dispense Refill  . acetaminophen (TYLENOL) 500 MG tablet Take 1,000 mg by mouth every 6 (six) hours as needed for moderate pain.     Marland Kitchen albuterol (VENTOLIN HFA) 108 (90 BASE) MCG/ACT inhaler Inhale 2 puffs into the lungs every 4 (four) hours as needed for wheezing or shortness of breath (((PLAN B))). 1 Inhaler 11  . amLODipine-olmesartan (AZOR) 10-40 MG tablet     . amLODipine-olmesartan (AZOR) 5-40 MG tablet Take 1 tablet by mouth daily.    Marland Kitchen aspirin 81 MG chewable tablet Chew 81 mg by mouth daily.    . cetirizine HCl (ZYRTEC) 5 MG/5ML SOLN Take 5 mg by mouth 2 (two) times daily as needed for  allergies.    Marland Kitchen clopidogrel (PLAVIX) 75 MG tablet Take 1 tablet (75 mg total) by mouth daily. 90 tablet 3  . dexlansoprazole (DEXILANT) 60 MG capsule Take 1 capsule (60 mg total) by mouth daily before breakfast. 30 capsule 11  . dicyclomine (BENTYL) 10 MG capsule Take 1 capsule (10 mg total) by mouth 4 (four) times daily -  before meals and at bedtime. 120 capsule 1  . nitroGLYCERIN (NITRODUR - DOSED IN MG/24 HR) 0.2 mg/hr patch   3  . nitroGLYCERIN (NITROSTAT) 0.4 MG SL tablet Place 0.4 mg under the tongue every 5 (five) minutes as needed for chest pain.     Marland Kitchen  ondansetron (ZOFRAN) 4 MG tablet     . prednisoLONE acetate (PRED FORTE) 1 % ophthalmic suspension Place 1 drop into the right eye every other day.     . rosuvastatin (CRESTOR) 10 MG tablet Take 10 mg by mouth at bedtime.     Marland Kitchen spironolactone (ALDACTONE) 25 MG tablet Take 25 mg by mouth daily.    Marland Kitchen umeclidinium-vilanterol (ANORO ELLIPTA) 62.5-25 MCG/INH AEPB Inhale 1 puff into the lungs daily. 3 each 2   No current facility-administered medications for this visit.     SURGICAL HISTORY:  Past Surgical History:  Procedure Laterality Date  . ABDOMINAL HYSTERECTOMY    . APPENDECTOMY    . Arnold-chiari malformation repair  1998   Suboccipital craniectomy  . CAROTID ENDARTERECTOMY  03/29/2010   Left  CEA  . CHOLECYSTECTOMY     Gall Bladder  . COLONOSCOPY WITH PROPOFOL N/A 04/22/2015   Procedure: COLONOSCOPY WITH PROPOFOL;  Surgeon: Carol Ada, MD;  Location: [facility];  Service: Endoscopy;  Laterality: N/A;  . COLONOSCOPY WITH PROPOFOL N/A 05/25/2016   Procedure: COLONOSCOPY WITH PROPOFOL;  Surgeon: Carol Ada, MD;  [facility]: [facility];  Service: Endoscopy;  Laterality: N/A;  . COLONOSCOPY WITH PROPOFOL N/A 05/03/2017   Procedure: COLONOSCOPY WITH PROPOFOL;  Surgeon: Carol Ada, MD;  [facility];  Service: Endoscopy;  Laterality: N/A;  . CORNEAL TRANSPLANT     Right  . CORONARY ARTERY BYPASS GRAFT  01/01/2000   x 3  . ESOPHAGOGASTRODUODENOSCOPY N/A 05/25/2016   Procedure: ESOPHAGOGASTRODUODENOSCOPY (EGD);  Surgeon: Carol Ada, MD;  Location: [facility];  Service: Endoscopy;  Laterality: N/A;  . EYE SURGERY Right 1995 or 1996   Laser surgery for retinal hemorrhage  . IR RADIOLOGIST EVAL & MGMT  12/14/2016  . LEFT HEART CATH AND CORS/GRAFTS ANGIOGRAPHY N/A 01/09/2018   Procedure: LEFT HEART CATH AND CORS/GRAFTS ANGIOGRAPHY;  Surgeon: Charolette Forward, MD;  Location: [place] CV LAB;  Service: Cardiovascular;  Laterality: N/A;  . LEFT HEART CATHETERIZATION WITH  CORONARY ANGIOGRAM N/A 08/03/2014   Procedure: LEFT HEART CATHETERIZATION WITH CORONARY ANGIOGRAM;  Surgeon: Birdie Riddle, MD;  Location: [facility] CATH LAB;  Service: Cardiovascular;  Laterality: N/A;  . Post Coronary Artery  BPG  01/05/2000   Right jugular sheath removed  . PR VEIN BYPASS GRAFT,AORTO-FEM-POP    . ROTATOR CUFF REPAIR     Right    REVIEW OF SYSTEMS:  Constitutional: positive for fatigue and weight loss Eyes: negative Ears, nose, mouth, throat, and face: negative Respiratory: positive for dyspnea on exertion Cardiovascular: negative Gastrointestinal: negative Genitourinary:negative Integument/breast: negative Hematologic/lymphatic: negative Musculoskeletal:negative Neurological: negative Behavioral/Psych: negative Endocrine: negative Allergic/Immunologic: negative   PHYSICAL EXAMINATION: General appearance: alert, cooperative, fatigued and no distress Head: Normocephalic, without obvious abnormality, atraumatic Neck: no adenopathy, no JVD, supple, symmetrical, trachea midline and thyroid not enlarged,  symmetric, no tenderness/mass/nodules Lymph nodes: Cervical, supraclavicular, and axillary nodes normal. Resp: wheezes bilaterally Back: symmetric, no curvature. ROM normal. No CVA tenderness. Cardio: regular rate and rhythm, S1, S2 normal, no murmur, click, rub or gallop GI: soft, non-tender; bowel sounds normal; no masses,  no organomegaly Extremities: extremities normal, atraumatic, no cyanosis or edema Neurologic: Alert and oriented X 3, normal strength and tone. Normal symmetric reflexes. Normal coordination and gait  ECOG PERFORMANCE STATUS: 1 - Symptomatic but completely ambulatory  Blood pressure (!) 157/38, pulse 80, temperature 97.9 F (36.6 C), temperature source Oral, resp. rate 18, height 5\' 2"  (1.575 m), weight 153 lb 11.2 oz (69.7 kg), SpO2 100 %.  LABORATORY DATA: Lab Results  Component Value Date   WBC 7.0 02/21/2018   HGB 8.9 (L) 02/21/2018   HCT  26.6 (L) 02/21/2018   MCV 90.6 02/21/2018   PLT 279 02/21/2018      Chemistry      Component Value Date/Time   NA 139 02/21/2018 0752   NA 139 08/02/2017 0821   K 4.7 02/21/2018 0752   K 4.3 08/02/2017 0821   CL 108 02/21/2018 0752   CO2 23 02/21/2018 0752   CO2 24 08/02/2017 0821   BUN 24 (H) 02/21/2018 0752   BUN 14.4 08/02/2017 0821   CREATININE 1.33 (H) 02/21/2018 0752   CREATININE 1.3 (H) 08/02/2017 0821      Component Value Date/Time   CALCIUM 9.7 02/21/2018 0752   CALCIUM 9.2 08/02/2017 0821   ALKPHOS 68 02/21/2018 0752   ALKPHOS 66 08/02/2017 0821   AST 14 (L) 02/21/2018 0752   AST 14 08/02/2017 0821   ALT 9 02/21/2018 0752   ALT 8 08/02/2017 0821   BILITOT 0.2 (L) 02/21/2018 0752   BILITOT 0.34 08/02/2017 0821       RADIOGRAPHIC STUDIES: Ct Chest Wo Contrast  Result Date: 02/10/2018 CLINICAL DATA:  Non-small cell right lung cancer. EXAM: CT CHEST WITHOUT CONTRAST TECHNIQUE: Multidetector CT imaging of the chest was performed following the standard protocol without IV contrast. COMPARISON:  08/22/2017 FINDINGS: Cardiovascular: The heart size is normal. No substantial pericardial effusion. Coronary artery calcification is evident. Atherosclerotic calcification is noted in the wall of the thoracic aorta. Status post CABG. Mediastinum/Nodes: No mediastinal lymphadenopathy. No evidence for gross hilar lymphadenopathy although assessment is limited by the lack of intravenous contrast on today's study. The esophagus has normal imaging features. There is no axillary lymphadenopathy. Lungs/Pleura: The central tracheobronchial airways are patent. Stable biapical pleuroparenchymal scarring. Calcified irregular right upper lobe nodule is unchanged. Peripheral wedge-shaped area of ground-glass attenuation in the posterior right lower lobe persists and is similar to prior. 5 mm central nodular component is unchanged. No new or progressive findings in the lungs. No pleural effusion.  Upper Abdomen: Unremarkable. Musculoskeletal: No worrisome lytic or sclerotic osseous abnormality. IMPRESSION: 1. Stable exam.  No new or progressive findings. 2. Wedge-shaped area of peripheral geographic ground-glass attenuation in the posterior right lower lobe is stable. Nodular components within this area are also unchanged in the interval. Continued attention on follow-up recommended. 3.  Aortic Atherosclerois (ICD10-170.0) Electronically Signed   By: Misty Stanley M.D.   On: 02/10/2018 14:40   Mm 3d Screen Breast Bilateral  Result Date: 02/17/2018 CLINICAL DATA:  Screening. EXAM: DIGITAL SCREENING BILATERAL MAMMOGRAM WITH TOMO AND CAD COMPARISON:  Previous exam(s). ACR Breast Density Category b: There are scattered areas of fibroglandular density. FINDINGS: There are no findings suspicious for malignancy. Images were processed with CAD. IMPRESSION:  No mammographic evidence of malignancy. A result letter of this screening mammogram will be mailed directly to the patient. RECOMMENDATION: Screening mammogram in one year. (Code:SM-B-01Y) BI-RADS CATEGORY  1: Negative. Electronically Signed   By: Ammie Ferrier M.D.   On: 02/17/2018 16:43    ASSESSMENT AND PLAN:  This is a very pleasant 77 years old white female with a stage IA non-small cell lung cancer, squamous cell carcinoma presented with right lower lobe pulmonary nodule is status post stereotactic radiotherapy.  The patient is feeling fine today with no specific complaints except for fatigue. She had a repeat CT scan of the chest performed recently.  I personally and independently reviewed the scans and discussed the results with the patient and her sister today.  Her scan showed no concerning findings for disease recurrence. I recommended for her to continue on observation with repeat CT scan of the chest in 6 months. For the iron deficiency anemia, I will arrange for the patient to receive 2 doses of Feraheme 510 mg IV weekly.  First dose on  02/28/2018. She will also have repeat iron study and ferritin in 6 months. The patient was advised to call immediately if she has any concerning symptoms in the interval. All questions were answered. The patient knows to call the clinic with any problems, questions or concerns. We can certainly see the patient much sooner if necessary.  Disclaimer: This note was dictated with voice recognition software. Similar sounding words can inadvertently be transcribed and may not be corrected upon review.

## 2018-02-24 NOTE — Telephone Encounter (Signed)
Printed avs and calender of upcoming appointments. Per 7/15 los gave information on the [facility], in [place] phone number

## 2018-02-25 ENCOUNTER — Encounter (HOSPITAL_COMMUNITY): Payer: Self-pay | Admitting: *Deleted

## 2018-02-27 ENCOUNTER — Encounter (HOSPITAL_COMMUNITY): Payer: Self-pay | Admitting: *Deleted

## 2018-02-27 ENCOUNTER — Ambulatory Visit (HOSPITAL_COMMUNITY): Payer: Medicare Other | Admitting: Anesthesiology

## 2018-02-27 ENCOUNTER — Encounter (HOSPITAL_COMMUNITY)
Admission: RE | Disposition: A | Discharge status: Home / Self Care | Payer: Self-pay | Source: Ambulatory Visit | Attending: Gastroenterology

## 2018-02-27 ENCOUNTER — Ambulatory Visit (HOSPITAL_COMMUNITY)
Admission: RE | Admit: 2018-02-27 | Discharge: 2018-02-27 | Disposition: A | Discharge status: Home / Self Care | Payer: Medicare Other | Source: Ambulatory Visit | Attending: Gastroenterology | Admitting: Gastroenterology

## 2018-02-27 ENCOUNTER — Other Ambulatory Visit: Payer: Self-pay

## 2018-02-27 DIAGNOSIS — Z951 Presence of aortocoronary bypass graft: Secondary | ICD-10-CM | POA: Insufficient documentation

## 2018-02-27 DIAGNOSIS — E785 Hyperlipidemia, unspecified: Secondary | ICD-10-CM | POA: Diagnosis not present

## 2018-02-27 DIAGNOSIS — I10 Essential (primary) hypertension: Secondary | ICD-10-CM | POA: Insufficient documentation

## 2018-02-27 DIAGNOSIS — D509 Iron deficiency anemia, unspecified: Secondary | ICD-10-CM | POA: Insufficient documentation

## 2018-02-27 DIAGNOSIS — Z923 Personal history of irradiation: Secondary | ICD-10-CM | POA: Diagnosis not present

## 2018-02-27 DIAGNOSIS — Z87891 Personal history of nicotine dependence: Secondary | ICD-10-CM | POA: Insufficient documentation

## 2018-02-27 DIAGNOSIS — Z947 Corneal transplant status: Secondary | ICD-10-CM | POA: Diagnosis not present

## 2018-02-27 DIAGNOSIS — I2510 Atherosclerotic heart disease of native coronary artery without angina pectoris: Secondary | ICD-10-CM | POA: Diagnosis not present

## 2018-02-27 DIAGNOSIS — I739 Peripheral vascular disease, unspecified: Secondary | ICD-10-CM | POA: Diagnosis not present

## 2018-02-27 DIAGNOSIS — J449 Chronic obstructive pulmonary disease, unspecified: Secondary | ICD-10-CM | POA: Insufficient documentation

## 2018-02-27 DIAGNOSIS — K219 Gastro-esophageal reflux disease without esophagitis: Secondary | ICD-10-CM | POA: Diagnosis not present

## 2018-02-27 DIAGNOSIS — K6389 Other specified diseases of intestine: Secondary | ICD-10-CM | POA: Insufficient documentation

## 2018-02-27 DIAGNOSIS — Z85118 Personal history of other malignant neoplasm of bronchus and lung: Secondary | ICD-10-CM | POA: Diagnosis not present

## 2018-02-27 DIAGNOSIS — H409 Unspecified glaucoma: Secondary | ICD-10-CM | POA: Insufficient documentation

## 2018-02-27 DIAGNOSIS — Z87728 Personal history of other specified (corrected) congenital malformations of nervous system and sense organs: Secondary | ICD-10-CM | POA: Diagnosis not present

## 2018-02-27 DIAGNOSIS — I252 Old myocardial infarction: Secondary | ICD-10-CM | POA: Diagnosis not present

## 2018-02-27 HISTORY — PX: HOT HEMOSTASIS: SHX5433

## 2018-02-27 HISTORY — PX: ENTEROSCOPY: SHX5533

## 2018-02-27 SURGERY — ENTEROSCOPY
Anesthesia: Monitor Anesthesia Care

## 2018-02-27 MED ORDER — SODIUM CHLORIDE 0.9 % IV SOLN: INTRAVENOUS | Status: DC

## 2018-02-27 MED ORDER — PROPOFOL 10 MG/ML IV BOLUS
INTRAVENOUS | Status: AC
  Filled 2018-02-27: qty 20

## 2018-02-27 MED ORDER — LACTATED RINGERS IV SOLN
INTRAVENOUS | Status: DC
  Administered 2018-02-27: 13:00:00 via INTRAVENOUS

## 2018-02-27 MED ORDER — PROPOFOL 500 MG/50ML IV EMUL
INTRAVENOUS | Status: DC | PRN
  Administered 2018-02-27: 120 ug/kg/min via INTRAVENOUS

## 2018-02-27 MED ORDER — ONDANSETRON HCL 4 MG/2ML IJ SOLN
INTRAMUSCULAR | Status: DC | PRN
  Administered 2018-02-27: 4 mg via INTRAVENOUS

## 2018-02-27 MED ORDER — PROPOFOL 10 MG/ML IV BOLUS
INTRAVENOUS | Status: DC | PRN
  Administered 2018-02-27 (×5): 20 mg via INTRAVENOUS

## 2018-02-27 MED ORDER — DEXAMETHASONE SODIUM PHOSPHATE 10 MG/ML IJ SOLN
INTRAMUSCULAR | Status: DC | PRN
  Administered 2018-02-27: 10 mg via INTRAVENOUS

## 2018-02-27 MED ORDER — LIDOCAINE 2% (20 MG/ML) 5 ML SYRINGE
INTRAMUSCULAR | Status: DC | PRN
  Administered 2018-02-27: 100 mg via INTRAVENOUS

## 2018-02-27 NOTE — Anesthesia Preprocedure Evaluation (Signed)
Anesthesia Evaluation  Patient identified by MRN, date of birth, ID band Patient awake    Reviewed: Allergy & Precautions, H&P , NPO status , Patient's Chart, lab work & pertinent test results  History of Anesthesia Complications (+) PONV and history of anesthetic complications  Airway Mallampati: II   Neck ROM: full    Dental   Pulmonary asthma , COPD, former smoker,    breath sounds clear to auscultation       Cardiovascular hypertension, + CAD, + Past MI, + CABG and + Peripheral Vascular Disease   Rhythm:regular Rate:Normal     Neuro/Psych  Headaches,  Neuromuscular disease    GI/Hepatic hiatal hernia, GERD  ,  Endo/Other    Renal/GU Renal InsufficiencyRenal disease     Musculoskeletal   Abdominal   Peds  Hematology  (+) anemia ,   Anesthesia Other Findings   Reproductive/Obstetrics                             Anesthesia Physical Anesthesia Plan  ASA: III  Anesthesia Plan: MAC   Post-op Pain Management:    Induction: Intravenous  PONV Risk Score and Plan: 3 and Ondansetron, Dexamethasone, Propofol infusion and Treatment may vary due to age or medical condition  Airway Management Planned: Nasal Cannula  Additional Equipment:   Intra-op Plan:   Post-operative Plan:   Informed Consent: I have reviewed the patients History and Physical, chart, labs and discussed the procedure including the risks, benefits and alternatives for the proposed anesthesia with the patient or authorized representative who has indicated his/her understanding and acceptance.     Plan Discussed with: CRNA, Anesthesiologist and Surgeon  Anesthesia Plan Comments:         Anesthesia Quick Evaluation

## 2018-02-27 NOTE — Transfer of Care (Signed)
Immediate Anesthesia Transfer of Care Note  Patient: [name]  Procedure(s) Performed: ENTEROSCOPY (N/A ) HOT HEMOSTASIS (ARGON PLASMA COAGULATION/BICAP) (N/A )  Patient Location: Endoscopy Unit  Anesthesia Type:MAC  Level of Consciousness: awake and alert   Airway & Oxygen Therapy: Patient Spontanous Breathing and Patient connected to nasal cannula oxygen  Post-op Assessment: Report given to RN and Post -op Vital signs reviewed and stable  Post vital signs: Reviewed and stable  Last Vitals:  Vitals Value Taken Time  BP    Temp    Pulse 61 02/27/2018  2:38 PM  Resp 20 02/27/2018  2:38 PM  SpO2 99 % 02/27/2018  2:38 PM  Vitals shown include unvalidated device data.  Last Pain:  Vitals:   02/27/18 1247  TempSrc: Oral         Complications: No apparent anesthesia complications

## 2018-02-27 NOTE — Anesthesia Procedure Notes (Signed)
Date/Time: 02/27/2018 2:10 PM Performed by: Sharlette Dense, CRNA Oxygen Delivery Method: Nasal cannula

## 2018-02-27 NOTE — Discharge Instructions (Signed)

## 2018-02-27 NOTE — H&P (Signed)
[name] HPI:  Recently, with routine follow up, Dr. Earlie Server checked her CBC and it was low at 8.9 g/dL. Recently her baseline was around 10-11 g/dL. A colonoscopy last Fall, 04/21/2017 was only revealing for diverticula and it was performed for complaints of hematochezia. She had a colonoscopy and EGD to work up an [place] on 05/29/2016 and the examinations were negative. During that time she was admitted to the hospital with a similar HGB at 8.7 g/dL. A colonoscopy was performed in 2016, and that procedure was for routine follow up of a prior history of polyps. The procedure was significant for 4 adenomas. Currently the patient is on Plavix and she has noticed some dark stools. A capsule endoscopy was recommended during that admission, but for reasons that are not clear, it was not performed.      Past Medical History:  Diagnosis Date  . 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 Asthma   . 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  . Blood in stool    last week of aug 2018  . Carotid artery occlusion   . Complication of anesthesia   . COPD (chronic obstructive pulmonary disease) ([place])   . Coronary artery disease   . 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 Diverticulitis   . Dyspnea    with exertion  . Gastroesophageal reflux disease    occ  . Glaucoma    right eye  . Headache    tension  . Hiatal hernia   . Hyperlipidemia   . Hypertension   . Iliac artery aneurysm ([place])   . Lung cancer ([place]) dx 2018   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  . Myocardial infarction [facility]) 01/01/2000   Cardiac catheterization  . Peripheral vascular disease ([place])    stents in legs x 2 or 3  . Pneumonia    last time winter 2017 -2018  . PONV (postoperative nausea and vomiting)    occassionally, last colonscopy did ok with anesthesia  . Reflux     Past Surgical History:  Procedure Laterality  Date  . ABDOMINAL HYSTERECTOMY    . APPENDECTOMY    . Arnold-chiari malformation repair  1998   Suboccipital craniectomy  . CAROTID ENDARTERECTOMY  03/29/2010   Left  CEA  . CHOLECYSTECTOMY     Gall Bladder  . COLONOSCOPY WITH PROPOFOL N/A 04/22/2015   Procedure: COLONOSCOPY WITH PROPOFOL;  Surgeon: Carol Ada, MD;  [facility];  Service: Endoscopy;  Laterality: N/A;  . COLONOSCOPY WITH PROPOFOL N/A 05/25/2016   Procedure: COLONOSCOPY WITH PROPOFOL;  Surgeon: Carol Ada, MD;  [facility];  Service: Endoscopy;  Laterality: N/A;  . COLONOSCOPY WITH PROPOFOL N/A 05/03/2017   Procedure: COLONOSCOPY WITH PROPOFOL;  Surgeon: Carol Ada, MD;  [facility];  Service: Endoscopy;  Laterality: N/A;  . CORNEAL TRANSPLANT     Right  . CORONARY ARTERY BYPASS GRAFT  01/01/2000   x 3  . ESOPHAGOGASTRODUODENOSCOPY N/A 05/25/2016   Procedure: ESOPHAGOGASTRODUODENOSCOPY (EGD);  Surgeon: Carol Ada, MD;  Location[facility];  Service: Endoscopy;  Laterality: N/A;  . EYE SURGERY Right 1995 or 1996   Laser surgery for retinal hemorrhage  . IR RADIOLOGIST EVAL & MGMT  12/14/2016  . LEFT HEART CATH AND CORS/GRAFTS ANGIOGRAPHY N/A 01/09/2018   Procedure: LEFT HEART CATH AND CORS/GRAFTS ANGIOGRAPHY;  Surgeon: Charolette Forward, MD;  Location: [facility] CV LAB;  Service:  Cardiovascular;  Laterality: N/A;  . LEFT HEART CATHETERIZATION WITH CORONARY ANGIOGRAM N/A 08/03/2014   Procedure: LEFT HEART CATHETERIZATION WITH CORONARY ANGIOGRAM;  Surgeon: Birdie Riddle, MD;  Location: [facility] CATH LAB;  Service: Cardiovascular;  Laterality: N/A;  . Post Coronary Artery  BPG  01/05/2000   Right jugular sheath removed  . PR VEIN BYPASS GRAFT,AORTO-FEM-POP    . ROTATOR CUFF REPAIR     Right    Family History  Problem Relation Age of Onset  . Heart disease Mother        Heart Disease before age 60  . Hypertension Mother   . Hyperlipidemia Mother   . Heart attack Mother   . Clotting  disorder Mother   . Heart disease Father        Heart Disease before age 67  . Heart attack Father   . Hyperlipidemia Father   . Hypertension Father   . Heart disease Brother        Heart Disease before age 9  . Hyperlipidemia Brother   . Hypertension Brother   . Clotting disorder Brother   . AAA (abdominal aortic aneurysm) Brother   . Cerebral aneurysm Sister   . Hypertension Sister   . AAA (abdominal aortic aneurysm) Sister   . Asthma Sister   . Cerebral aneurysm Brother   . Cancer Brother        Lung  . Hypertension Brother   . Heart attack Brother   . Heart disease Brother        Aneurysm of Brain  . Hypertension Brother   . Heart disease Brother   . Heart disease Brother   . Stroke Son        Aneurysm of Stomach  . AAA (abdominal aortic aneurysm) Son   . Cancer Maternal Uncle        great uncle/cancer/type unknown    Social History:  reports that she quit smoking about 17 years ago. Her smoking use included cigarettes. She has a 45.00 pack-year smoking history. She has never used smokeless tobacco. She reports that she does not drink alcohol or use drugs.  Allergies:  Allergies  Allergen Reactions  . Hydromorphone Palpitations and Other (See Comments)    DILAUDID  -  Pt had a Heart Attack after taking Dilaudid.  . Levaquin [Levofloxacin] Other (See Comments)    Chest pressure, SOB, "pain in between shoulder blades", sweaty -as reported by patient per experience in ED this afternoon  . Azithromycin Other (See Comments)    Patient reported past history of lip swelling  . Codeine Other (See Comments)    Dr. Terrence Dupont advised patient not to take this medication  . Doxycycline Swelling and Other (See Comments)    Mouth, lips, feet swelling  . Oxycodone-Acetaminophen Other (See Comments)    Says it makes her feel weird  . Risedronate Other (See Comments)    Chest pain  . Avelox [Moxifloxacin Hcl In Nacl] Palpitations    Medications:  Scheduled:  Continuous: .  sodium chloride    . lactated ringers 10 mL/hr at 02/27/18 1256    No results found for this or any previous visit (from the past 24 hour(s)).   No results found.  ROS:  As stated above in the HPI otherwise negative.  Blood pressure (!) 115/50, pulse 67, temperature 97.9 F (36.6 C), temperature source Oral, resp. rate 17, height 5\' 2"  (1.575 m), weight 69.4 kg (153 lb), SpO2 99 %.    PE: Gen: NAD, Alert  and Oriented HEENT:  [place]/AT, EOMI Neck: Supple, no LAD Lungs: CTA Bilaterally CV: RRR without M/G/R ABM: Soft, NTND, +BS Ext: No C/C/E  Assessment/Plan: 1) IDA - Enteroscopy  Clarity Ciszek D 02/27/2018, 2:06 PM

## 2018-02-27 NOTE — Anesthesia Postprocedure Evaluation (Signed)
Anesthesia Post Note  Patient: [name]  Procedure(s) Performed: ENTEROSCOPY (N/A ) HOT HEMOSTASIS (ARGON PLASMA COAGULATION/BICAP) (N/A )     Patient location during evaluation: Endoscopy Anesthesia Type: MAC Level of consciousness: oriented, awake and alert and patient cooperative Pain management: pain level controlled Vital Signs Assessment: post-procedure vital signs reviewed and stable Respiratory status: spontaneous breathing, nonlabored ventilation and respiratory function stable Cardiovascular status: blood pressure returned to baseline and stable Postop Assessment: no apparent nausea or vomiting Anesthetic complications: no    Last Vitals:  Vitals:   02/27/18 1438 02/27/18 1448  BP: (!) 124/39 (!) 119/57  Pulse: (!) 59 61  Resp: 20 14  Temp: 36.4 C   SpO2: 100% 96%    Last Pain:  Vitals:   02/27/18 1448  TempSrc:   PainSc: 0-No pain                 Lili Harts,E. [name]

## 2018-02-27 NOTE — Op Note (Signed)
[facility] Patient Name: [name] Procedure Date: 02/27/2018 MRN: [id number] Attending MD: Carol Ada , MD Date of Birth: [birth year] CSN: [id number] Age: 77 Admit Type: Outpatient Procedure:                Small bowel enteroscopy Indications:              Iron deficiency anemia Providers:                Carol Ada, MD, Elmer Ramp. Tilden Dome, RN, Tinnie Gens,                            Technician, Danley Danker, CRNA Referring MD:              Medicines:                Propofol per Anesthesia Complications:            No immediate complications. Estimated Blood Loss:     Estimated blood loss: none. Procedure:                Pre-Anesthesia Assessment:                           - Prior to the procedure, a History and Physical                            was performed, and patient medications and                            allergies were reviewed. The patient's tolerance of                            previous anesthesia was also reviewed. The risks                            and benefits of the procedure and the sedation                            options and risks were discussed with the patient.                            All questions were answered, and informed consent                            was obtained. Prior Anticoagulants: The patient has                            taken Plavix (clopidogrel), last dose was day of                            procedure. ASA Grade Assessment: III - A patient                            with severe systemic disease. After reviewing the  risks and benefits, the patient was deemed in                            satisfactory condition to undergo the procedure.                           - Sedation was administered by an anesthesia                            professional. Deep sedation was attained.                           After obtaining informed consent, the endoscope was                            passed  under direct vision. Throughout the                            procedure, the patient's blood pressure, pulse, and                            oxygen saturations were monitored continuously. The                            EC-[id number] ([id number]) scope was introduced through                            the mouth and advanced to the proximal jejunum. The                            small bowel enteroscopy was accomplished without                            difficulty. The patient tolerated the procedure                            well. Scope In: Scope Out: Findings:      The esophagus was normal.      The stomach was normal.      The examined duodenum was normal.      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 Coagulation for tissue destruction using monopolar probe was       successful. Estimated blood loss: none.      A couple of small atypical appearing AVMs were ablated with APC. The       patient was maintained on Plavix to help identify any overt bleeding       sites, but none were identified. Impression:               - Normal esophagus.                           - Normal stomach.                           - Normal examined duodenum.                           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 Treated with a monopolar probe.                           - No specimens collected. Recommendation:           - Patient has a contact number available for                            emergencies. The signs and symptoms of potential                            delayed complications were discussed with the                            patient. Return to normal activities tomorrow.                            Written discharge instructions were provided to the                            patient.                           - Resume regular diet.                           - To visualize the small bowel, perform  video                            capsule endoscopy.                           - Follow HGB.                           - Iron supplementation per Dr. Earlie Server. 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 All rights reserved. The codes documented in this report are preliminary and upon coder review may  be revised to meet current compliance requirements. Carol Ada, MD Carol Ada, MD 02/27/2018 2:37:00 PM This report has been signed electronically. Number of Addenda: 0

## 2018-02-28 ENCOUNTER — Ambulatory Visit (HOSPITAL_COMMUNITY)
Admission: RE | Admit: 2018-02-28 | Discharge: 2018-02-28 | Disposition: A | Discharge status: Home / Self Care | Payer: Medicare Other | Source: Ambulatory Visit | Attending: Internal Medicine | Admitting: Internal Medicine

## 2018-02-28 ENCOUNTER — Encounter (HOSPITAL_COMMUNITY): Payer: Self-pay | Admitting: Gastroenterology

## 2018-02-28 DIAGNOSIS — D500 Iron deficiency anemia secondary to blood loss (chronic): Secondary | ICD-10-CM | POA: Insufficient documentation

## 2018-02-28 MED ORDER — SODIUM CHLORIDE 0.9 % IV SOLN
Freq: Once | INTRAVENOUS | Status: AC
  Administered 2018-02-28: 10 mL/h via INTRAVENOUS

## 2018-02-28 MED ORDER — SODIUM CHLORIDE 0.9 % IV SOLN
510.0000 mg | Freq: Once | INTRAVENOUS | Status: AC
  Administered 2018-02-28: 510 mg via INTRAVENOUS
  Filled 2018-02-28: qty 17

## 2018-02-28 NOTE — Discharge Instructions (Signed)

## 2018-02-28 NOTE — Progress Notes (Signed)
Patient received Feraheme via PIV as ordered. Observed for 30 minutes post transfusion. Tolerated well, vitals stable, discharge instructions given, verbalized understanding. Patient alert, oriented and ambulatory at the time of discharge.

## 2018-03-07 ENCOUNTER — Ambulatory Visit (HOSPITAL_COMMUNITY)
Admission: RE | Admit: 2018-03-07 | Discharge: 2018-03-07 | Disposition: A | Discharge status: Home / Self Care | Payer: Medicare Other | Source: Ambulatory Visit | Attending: Internal Medicine | Admitting: Internal Medicine

## 2018-03-07 DIAGNOSIS — D500 Iron deficiency anemia secondary to blood loss (chronic): Secondary | ICD-10-CM | POA: Diagnosis not present

## 2018-03-07 MED ORDER — SODIUM CHLORIDE 0.9 % IV SOLN
510.0000 mg | Freq: Once | INTRAVENOUS | Status: AC
  Administered 2018-03-07: 510 mg via INTRAVENOUS
  Filled 2018-03-07: qty 17

## 2018-03-07 MED ORDER — SODIUM CHLORIDE 0.9 % IV SOLN
INTRAVENOUS | Status: DC
  Administered 2018-03-07: 11:00:00 via INTRAVENOUS

## 2018-03-07 NOTE — Discharge Instructions (Signed)

## 2018-03-07 NOTE — Progress Notes (Signed)
Patient received Feraheme via PIV as ordered. Was observed for 30 minutes post infusion.Tolerated well, vitals stable, discharge instructions given, verbalized understanding. Patient alert, oriented and ambulatory at the time of discharge.

## 2018-04-15 ENCOUNTER — Telehealth: Payer: Self-pay | Admitting: Neurology

## 2018-04-15 NOTE — Telephone Encounter (Signed)
I called pt. Offered 04/17/18 at 730am. She declined, was too early. Scheduled f/u for 04/25/18 at 12pm, check in 1130am with Dr. Jannifer Franklin. Pt verbalized understanding and appreciation.

## 2018-04-15 NOTE — Telephone Encounter (Signed)
Pt had MRI/head ordered by Dr Coletta Memos at [facility]. He advised her to make an appt with Dr Jannifer Franklin. Pt said she has a HA when sitting up. Pt states she can only be up for about 2 hours and then the HA begins, starting about 2 weeks ago. Please call to advise of an appt.

## 2018-04-18 ENCOUNTER — Ambulatory Visit (INDEPENDENT_AMBULATORY_CARE_PROVIDER_SITE_OTHER): Payer: Medicare Other | Admitting: Nurse Practitioner

## 2018-04-18 ENCOUNTER — Encounter: Payer: Self-pay | Admitting: Nurse Practitioner

## 2018-04-18 DIAGNOSIS — M940 Chondrocostal junction syndrome [Tietze]: Secondary | ICD-10-CM

## 2018-04-18 NOTE — Patient Instructions (Signed)
Please use warm wet compresses three times daily May alternate with ice packs Gentle stretching exercises Tylenol as needed Discussed recent x ray showed no abnormalities Please keep follow up with Dr. Lenna Gilford as scheduled Please call if symptoms worsen   Costochondritis Costochondritis is swelling and irritation (inflammation) of the tissue (cartilage) that connects your ribs to your breastbone (sternum). This causes pain in the front of your chest. The pain usually starts gradually and involves more than one rib. What are the causes? The exact cause of this condition is not always known. It results from stress on the cartilage where your ribs attach to your sternum. The cause of this stress could be:  Chest injury (trauma).  Exercise or activity, such as lifting.  Severe coughing.  What increases the risk? You may be at higher risk for this condition if you:  Are female.  Are 75?75 years old.  Recently started a new exercise or work activity.  Have low levels of vitamin D.  Have a condition that makes you cough frequently.  What are the signs or symptoms? The main symptom of this condition is chest pain. The pain:  Usually starts gradually and can be sharp or dull.  Gets worse with deep breathing, coughing, or exercise.  Gets better with rest.  May be worse when you press on the sternum-rib connection (tenderness).  How is this diagnosed? This condition is diagnosed based on your symptoms, medical history, and a physical exam. Your health care provider will check for tenderness when pressing on your sternum. This is the most important finding. You may also have tests to rule out other causes of chest pain. These may include:  A chest X-ray to check for lung problems.  An electrocardiogram (ECG) to see if you have a heart problem that could be causing the pain.  An imaging scan to rule out a chest or rib fracture.  How is this treated? This condition usually goes  away on its own over time. Your health care provider may prescribe an NSAID to reduce pain and inflammation. Your health care provider may also suggest that you:  Rest and avoid activities that make pain worse.  Apply heat or cold to the area to reduce pain and inflammation.  Do exercises to stretch your chest muscles.  If these treatments do not help, your health care provider may inject a numbing medicine at the sternum-rib connection to help relieve the pain. Follow these instructions at home:  Avoid activities that make pain worse. This includes any activities that use chest, abdominal, and side muscles.  If directed, put ice on the painful area: ? Put ice in a plastic bag. ? Place a towel between your skin and the bag. ? Leave the ice on for 20 minutes, 2-3 times a day.  If directed, apply heat to the affected area as often as told by your health care provider. Use the heat source that your health care provider recommends, such as a moist heat pack or a heating pad. ? Place a towel between your skin and the heat source. ? Leave the heat on for 20-30 minutes. ? Remove the heat if your skin turns bright red. This is especially important if you are unable to feel pain, heat, or cold. You may have a greater risk of getting burned.  Take over-the-counter and prescription medicines only as told by your health care provider.  Return to your normal activities as told by your health care provider. Ask your health  care provider what activities are safe for you.  Keep all follow-up visits as told by your health care provider. This is important. Contact a health care provider if:  You have chills or a fever.  Your pain does not go away or it gets worse.  You have a cough that does not go away (is persistent). Get help right away if:  You have shortness of breath. This information is not intended to replace advice given to you by your health care provider. Make sure you discuss any  questions you have with your health care provider. Document Released: 05/09/2005 Document Revised: 02/17/2016 Document Reviewed: 11/23/2015 Elsevier Interactive Patient Education  [employer].

## 2018-04-18 NOTE — Progress Notes (Signed)
@Patient  ID: [name], female    DOB: 1941-02-22, 77 y.o.   MRN: [id number]  Chief Complaint  Patient presents with  . Follow-up    Soreness on right side. 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 Lenna Gilford.  Tests: Chest x ray/right ribs 04/11/18 - no acute findings PET scan on 03/14/16 revealed a 0.9 cm metabolic nodule in the RLL with SUV max 3.2 and no evidence of mediastinal metastasis or distant metastasis. CT bx on 04/11/16 showed squamous cell carcinoma. PFT 04/10/16 FEV1 64%, ratio 70, FVC 69%, DLCO 50%.  Pain to right side for 1 week , no fever, no fall, hasn't taken any thing to help relieve the pain.   OV 04/18/18 - acute right side pain Patient presents for right side pain. She was seen on 04/11/18 when the symptoms started and had an x ray which showed no acute abnormalities. She denies any falls or injury, denies any rash, denies any shortness of breath or edema. Has not taken any thing to help relieve the pain.   Allergies  Allergen Reactions  . Hydromorphone Palpitations and Other (See Comments)    DILAUDID  -  Pt had a Heart Attack after taking Dilaudid.  . Levaquin [Levofloxacin] Other (See Comments)    Chest pressure, SOB, "pain in between shoulder blades", sweaty -as reported by patient per experience in ED this afternoon  . Azithromycin Other (See Comments)    Patient reported past history of lip swelling  . Codeine Other (See Comments)    Dr. Terrence Dupont advised patient not to take this medication  . Doxycycline Swelling and Other (See Comments)    Mouth, lips, feet swelling  . Oxycodone-Acetaminophen Other (See Comments)    Says it makes her feel weird  . Risedronate Other (See Comments)    Chest pain  . Avelox [Moxifloxacin Hcl In Nacl] Palpitations    Immunization History  Administered Date(s) Administered  . Influenza  Split 05/15/2012  . Influenza Whole 05/13/2012  . Influenza, High Dose Seasonal PF 05/22/2013, 07/13/2014  . Influenza,inj,Quad PF,6+ Mos 05/15/2012, 04/14/2015, 05/18/2015, 04/10/2016, 04/30/2017  . Pneumococcal Conjugate-13 05/15/2012  . Pneumococcal Polysaccharide-23 11/30/2014    Past Medical History:  Diagnosis Date  . 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 Arnold-Chiari malformation ([place]) 1998  . Asthma   . 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  . Blood in stool    last week of aug 2018  . Carotid artery occlusion   . Complication of anesthesia   . COPD (chronic obstructive pulmonary disease) ([place])   . Coronary artery disease   . 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 Diverticulitis   . Dyspnea    with exertion  . Gastroesophageal reflux disease    occ  . Glaucoma    right eye  . Headache    tension  . Hiatal hernia   . Hyperlipidemia   . Hypertension   . Iliac artery aneurysm ([place])   . Lung cancer ([place]) dx 2018   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  . Myocardial infarction [facility]) 01/01/2000   Cardiac catheterization  . Peripheral vascular disease ([place])    stents in legs x 2 or 3  . Pneumonia    last time winter 2017 -2018  . PONV (  postoperative nausea and vomiting)    occassionally, last colonscopy did ok with anesthesia  . Reflux     Tobacco History: Social History   Tobacco Use  Smoking Status Former Smoker  . Packs/day: 1.50  . Years: 30.00  . Pack years: 45.00  . Types: Cigarettes  . Last attempt to quit: 08/13/2000  . Years since quitting: 17.6  Smokeless Tobacco Never Used   Counseling given: Yes   Outpatient Encounter Medications as of 04/18/2018  Medication Sig  . acetaminophen (TYLENOL) 325 MG tablet Take 650 mg by mouth every 6 (six) hours as needed for moderate pain or headache.   . albuterol (VENTOLIN HFA) 108 (90 BASE) MCG/ACT inhaler Inhale 2 puffs into the lungs every 4  (four) hours as needed for wheezing or shortness of breath (((PLAN B))).  Marland Kitchen amLODipine-olmesartan (AZOR) 5-40 MG tablet Take 1 tablet by mouth daily.  Marland Kitchen aspirin 81 MG chewable tablet Chew 81 mg by mouth daily.  . cetirizine HCl (ZYRTEC) 5 MG/5ML SOLN Take 5 mg by mouth 2 (two) times daily as needed for allergies.  Marland Kitchen clopidogrel (PLAVIX) 75 MG tablet Take 1 tablet (75 mg total) by mouth daily.  Marland Kitchen dexlansoprazole (DEXILANT) 60 MG capsule Take 1 capsule (60 mg total) by mouth daily before breakfast.  . nitroGLYCERIN (NITROSTAT) 0.4 MG SL tablet Place 0.4 mg under the tongue every 5 (five) minutes as needed for chest pain.   . prednisoLONE acetate (PRED FORTE) 1 % ophthalmic suspension Place 1 drop into the right eye every other day.   . rosuvastatin (CRESTOR) 10 MG tablet Take 10 mg by mouth at bedtime.   Marland Kitchen spironolactone (ALDACTONE) 25 MG tablet Take 25 mg by mouth daily.  Marland Kitchen umeclidinium-vilanterol (ANORO ELLIPTA) 62.5-25 MCG/INH AEPB Inhale 1 puff into the lungs daily.  . [DISCONTINUED] dicyclomine (BENTYL) 10 MG capsule Take 1 capsule (10 mg total) by mouth 4 (four) times daily -  before meals and at bedtime. (Patient not taking: Reported on 04/18/2018)   No facility-administered encounter medications on file as of 04/18/2018.      Review of Systems  Review of Systems  Constitutional: Negative.   HENT: Negative.   Respiratory: Negative for cough and shortness of breath.   Cardiovascular: Negative.   Gastrointestinal: Negative.   Allergic/Immunologic: Negative.   Neurological: Negative.   Psychiatric/Behavioral: Negative.        Physical Exam  BP 122/70 (BP Location: Left Arm, Patient Position: Sitting, Cuff Size: Normal)   Pulse 60   Ht 5\' 2"  (1.575 m)   Wt 159 lb 6.4 oz (72.3 kg)   SpO2 97%   BMI 29.15 kg/m   Wt Readings from Last 5 Encounters:  04/18/18 159 lb 6.4 oz (72.3 kg)  02/27/18 153 lb (69.4 kg)  02/24/18 153 lb 11.2 oz (69.7 kg)  02/12/18 155 lb (70.3 kg)    02/10/18 156 lb 3.2 oz (70.9 kg)     Physical Exam  Constitutional: She is oriented to person, place, and time. She appears well-developed and well-nourished. No distress.  Cardiovascular: Normal rate and regular rhythm.  Pulmonary/Chest: Effort normal and breath sounds normal.  Pinpoint tenderness over right ribs. No rash noted.   Neurological: She is alert and oriented to person, place, and time.  Psychiatric: She has a normal mood and affect.  Nursing note and vitals reviewed.     Assessment & Plan:   Costochondritis, acute Patient Instructions  Please use warm wet compresses three times daily May alternate with ice  packs Gentle stretching exercises Tylenol as needed Discussed recent x ray showed no abnormalities Please keep follow up with Dr. Lenna Gilford as scheduled Please call if symptoms worsen   Costochondritis Costochondritis is swelling and irritation (inflammation) of the tissue (cartilage) that connects your ribs to your breastbone (sternum). This causes pain in the front of your chest. The pain usually starts gradually and involves more than one rib. What are the causes? The exact cause of this condition is not always known. It results from stress on the cartilage where your ribs attach to your sternum. The cause of this stress could be:  Chest injury (trauma).  Exercise or activity, such as lifting.  Severe coughing.  What increases the risk? You may be at higher risk for this condition if you:  Are female.  Are 9?75 years old.  Recently started a new exercise or work activity.  Have low levels of vitamin D.  Have a condition that makes you cough frequently.  What are the signs or symptoms? The main symptom of this condition is chest pain. The pain:  Usually starts gradually and can be sharp or dull.  Gets worse with deep breathing, coughing, or exercise.  Gets better with rest.  May be worse when you press on the sternum-rib connection  (tenderness).  How is this diagnosed? This condition is diagnosed based on your symptoms, medical history, and a physical exam. Your health care provider will check for tenderness when pressing on your sternum. This is the most important finding. You may also have tests to rule out other causes of chest pain. These may include:  A chest X-ray to check for lung problems.  An electrocardiogram (ECG) to see if you have a heart problem that could be causing the pain.  An imaging scan to rule out a chest or rib fracture.  How is this treated? This condition usually goes away on its own over time. Your health care provider may prescribe an NSAID to reduce pain and inflammation. Your health care provider may also suggest that you:  Rest and avoid activities that make pain worse.  Apply heat or cold to the area to reduce pain and inflammation.  Do exercises to stretch your chest muscles.  If these treatments do not help, your health care provider may inject a numbing medicine at the sternum-rib connection to help relieve the pain. Follow these instructions at home:  Avoid activities that make pain worse. This includes any activities that use chest, abdominal, and side muscles.  If directed, put ice on the painful area: ? Put ice in a plastic bag. ? Place a towel between your skin and the bag. ? Leave the ice on for 20 minutes, 2-3 times a day.  If directed, apply heat to the affected area as often as told by your health care provider. Use the heat source that your health care provider recommends, such as a moist heat pack or a heating pad. ? Place a towel between your skin and the heat source. ? Leave the heat on for 20-30 minutes. ? Remove the heat if your skin turns bright red. This is especially important if you are unable to feel pain, heat, or cold. You may have a greater risk of getting burned.  Take over-the-counter and prescription medicines only as told by your health care  provider.  Return to your normal activities as told by your health care provider. Ask your health care provider what activities are safe for you.  Keep all  follow-up visits as told by your health care provider. This is important. Contact a health care provider if:  You have chills or a fever.  Your pain does not go away or it gets worse.  You have a cough that does not go away (is persistent). Get help right away if:  You have shortness of breath. This information is not intended to replace advice given to you by your health care provider. Make sure you discuss any questions you have with your health care provider. Document Released: 05/09/2005 Document Revised: 02/17/2016 Document Reviewed: 11/23/2015 Elsevier Interactive Patient Education  2018 [place], [place] 04/18/2018

## 2018-04-18 NOTE — Assessment & Plan Note (Signed)
Patient Instructions  Please use warm wet compresses three times daily May alternate with ice packs Gentle stretching exercises Tylenol as needed Discussed recent x ray showed no abnormalities Please keep follow up with Dr. Lenna Gilford as scheduled Please call if symptoms worsen   Costochondritis Costochondritis is swelling and irritation (inflammation) of the tissue (cartilage) that connects your ribs to your breastbone (sternum). This causes pain in the front of your chest. The pain usually starts gradually and involves more than one rib. What are the causes? The exact cause of this condition is not always known. It results from stress on the cartilage where your ribs attach to your sternum. The cause of this stress could be:  Chest injury (trauma).  Exercise or activity, such as lifting.  Severe coughing.  What increases the risk? You may be at higher risk for this condition if you:  Are female.  Are 10?75 years old.  Recently started a new exercise or work activity.  Have low levels of vitamin D.  Have a condition that makes you cough frequently.  What are the signs or symptoms? The main symptom of this condition is chest pain. The pain:  Usually starts gradually and can be sharp or dull.  Gets worse with deep breathing, coughing, or exercise.  Gets better with rest.  May be worse when you press on the sternum-rib connection (tenderness).  How is this diagnosed? This condition is diagnosed based on your symptoms, medical history, and a physical exam. Your health care provider will check for tenderness when pressing on your sternum. This is the most important finding. You may also have tests to rule out other causes of chest pain. These may include:  A chest X-ray to check for lung problems.  An electrocardiogram (ECG) to see if you have a heart problem that could be causing the pain.  An imaging scan to rule out a chest or rib fracture.  How is this treated? This  condition usually goes away on its own over time. Your health care provider may prescribe an NSAID to reduce pain and inflammation. Your health care provider may also suggest that you:  Rest and avoid activities that make pain worse.  Apply heat or cold to the area to reduce pain and inflammation.  Do exercises to stretch your chest muscles.  If these treatments do not help, your health care provider may inject a numbing medicine at the sternum-rib connection to help relieve the pain. Follow these instructions at home:  Avoid activities that make pain worse. This includes any activities that use chest, abdominal, and side muscles.  If directed, put ice on the painful area: ? Put ice in a plastic bag. ? Place a towel between your skin and the bag. ? Leave the ice on for 20 minutes, 2-3 times a day.  If directed, apply heat to the affected area as often as told by your health care provider. Use the heat source that your health care provider recommends, such as a moist heat pack or a heating pad. ? Place a towel between your skin and the heat source. ? Leave the heat on for 20-30 minutes. ? Remove the heat if your skin turns bright red. This is especially important if you are unable to feel pain, heat, or cold. You may have a greater risk of getting burned.  Take over-the-counter and prescription medicines only as told by your health care provider.  Return to your normal activities as told by your health care provider.  Ask your health care provider what activities are safe for you.  Keep all follow-up visits as told by your health care provider. This is important. Contact a health care provider if:  You have chills or a fever.  Your pain does not go away or it gets worse.  You have a cough that does not go away (is persistent). Get help right away if:  You have shortness of breath. This information is not intended to replace advice given to you by your health care provider. Make  sure you discuss any questions you have with your health care provider. Document Released: 05/09/2005 Document Revised: 02/17/2016 Document Reviewed: 11/23/2015 Elsevier Interactive Patient Education  [employer].

## 2018-04-25 ENCOUNTER — Ambulatory Visit (INDEPENDENT_AMBULATORY_CARE_PROVIDER_SITE_OTHER): Payer: Medicare Other | Admitting: Neurology

## 2018-04-25 ENCOUNTER — Encounter: Payer: Self-pay | Admitting: Neurology

## 2018-04-25 VITALS — BP 160/50 | HR 70 | Ht 62.0 in | Wt 160.5 lb

## 2018-04-25 DIAGNOSIS — G4489 Other headache syndrome: Secondary | ICD-10-CM | POA: Diagnosis not present

## 2018-04-25 DIAGNOSIS — G971 Other reaction to spinal and lumbar puncture: Secondary | ICD-10-CM | POA: Diagnosis not present

## 2018-04-25 NOTE — Progress Notes (Signed)
Reason for visit: Positional headache  [name] is a 77 y.o. female  History of present illness:  [name] is a 77 year old right-handed white female with a history of Arnold-Chiari malformation, status post suboccipital craniotomy several years ago.  The patient has been seen in 2 years ago for occipital neuralgia.  The patient has done fairly well over the last 2 years but within the last month, she has developed a positional type headache that occurs with sitting or standing.  The patient has headache that may occur in the occipital area and frontal regions, she may have some photophobia and phonophobia, the patient may feel somewhat nauseated but she does not vomit.  The patient denies any muffled hearing, she does have some neck discomfort at times.  If she lies down, the headache completely disappears within 5 to 10 minutes.  If she sits up again, the headache comes back on.  The patient feels somewhat weak in the legs, she has a tendency to stagger, she has not had any falls.  She has noted over the last 2 to 3 weeks that he has some tingling sensations in the hands bilaterally.  The patient denies any change in the control of the bowels or the bladder.  The patient has undergone MRI of the brain and has brought the disc in for my review.  This appears to show minimal white matter changes, some white matter disease of the left frontal area, no acute changes otherwise.  Contrast was not given with the study.  Headaches have been daily in nature since they started a month ago.  She denies any clear sinus drainage.  She has not had any spinal injections or spinal procedures or trauma recently.  Past Medical History:  Diagnosis Date  . 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 Arnold-Chiari malformation ([place]) 1998  . Asthma   . 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  . Blood in stool    last week of aug 2018  . Carotid artery  occlusion   . Complication of anesthesia   . COPD (chronic obstructive pulmonary disease) ([place])   . Coronary artery disease   . 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 Diverticulitis   . Dyspnea    with exertion  . Gastroesophageal reflux disease    occ  . Glaucoma    right eye  . Headache    tension  . Hiatal hernia   . Hyperlipidemia   . Hypertension   . Iliac artery aneurysm ([place])   . Lung cancer ([place]) dx 2018   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  . Myocardial infarction [facility]) 01/01/2000   Cardiac catheterization  . Peripheral vascular disease ([place])    stents in legs x 2 or 3  . Pneumonia    last time winter 2017 -2018  . PONV (postoperative nausea and vomiting)    occassionally, last colonscopy did ok with anesthesia  . Reflux     Past Surgical History:  Procedure Laterality Date  . ABDOMINAL HYSTERECTOMY    . APPENDECTOMY    . Arnold-chiari malformation repair  1998   Suboccipital craniectomy  . CAROTID ENDARTERECTOMY  03/29/2010   Left  CEA  . CHOLECYSTECTOMY     Gall Bladder  . COLONOSCOPY WITH PROPOFOL N/A 04/22/2015   Procedure: COLONOSCOPY WITH PROPOFOL;  Surgeon: Carol Ada, MD;  Location: [facility];  Service: Endoscopy;  Laterality: N/A;  . COLONOSCOPY WITH  PROPOFOL N/A 05/25/2016   Procedure: COLONOSCOPY WITH PROPOFOL;  Surgeon: Carol Ada, MD;  Location: [facility];  Service: Endoscopy;  Laterality: N/A;  . COLONOSCOPY WITH PROPOFOL N/A 05/03/2017   Procedure: COLONOSCOPY WITH PROPOFOL;  Surgeon: Carol Ada, MD;  Location: [facility];  Service: Endoscopy;  Laterality: N/A;  . CORNEAL TRANSPLANT     Right  . CORONARY ARTERY BYPASS GRAFT  01/01/2000   x 3  . ENTEROSCOPY N/A 02/27/2018   Procedure: ENTEROSCOPY;  Surgeon: Carol Ada, MD;  Location: [facility];  Service: Endoscopy;  Laterality: N/A;  . ESOPHAGOGASTRODUODENOSCOPY N/A 05/25/2016   Procedure: ESOPHAGOGASTRODUODENOSCOPY (EGD);  Surgeon: Carol Ada, MD;  Location: [facility];  Service: Endoscopy;  Laterality: N/A;  . EYE SURGERY Right 1995 or 1996   Laser surgery for retinal hemorrhage  . HOT HEMOSTASIS N/A 02/27/2018   Procedure: HOT HEMOSTASIS (ARGON PLASMA COAGULATION/BICAP);  Surgeon: Carol Ada, MD;  Location: [facility];  Service: Endoscopy;  Laterality: N/A;  . IR RADIOLOGIST EVAL & MGMT  12/14/2016  . LEFT HEART CATH AND CORS/GRAFTS ANGIOGRAPHY N/A 01/09/2018   Procedure: LEFT HEART CATH AND CORS/GRAFTS ANGIOGRAPHY;  Surgeon: Charolette Forward, MD;  Location: [facility];  Service: Cardiovascular;  Laterality: N/A;  . LEFT HEART CATHETERIZATION WITH CORONARY ANGIOGRAM N/A 08/03/2014   Procedure: LEFT HEART CATHETERIZATION WITH CORONARY ANGIOGRAM;  Surgeon: Birdie Riddle, MD;  Location: [facility];  Service: Cardiovascular;  Laterality: N/A;  . Post Coronary Artery  BPG  01/05/2000   Right jugular sheath removed  . PR VEIN BYPASS GRAFT,AORTO-FEM-POP    . ROTATOR CUFF REPAIR     Right    Family History  Problem Relation Age of Onset  . Heart disease Mother        Heart Disease before age 81  . Hypertension Mother   . Hyperlipidemia Mother   . Heart attack Mother   . Clotting disorder Mother   . Heart disease Father        Heart Disease before age 77  . Heart attack Father   . Hyperlipidemia Father   . Hypertension Father   . Heart disease Brother        Heart Disease before age 51  . Hyperlipidemia Brother   . Hypertension Brother   . Clotting disorder Brother   . AAA (abdominal aortic aneurysm) Brother   . Cerebral aneurysm Sister   . Hypertension Sister   . AAA (abdominal aortic aneurysm) Sister   . Asthma Sister   . Cerebral aneurysm Brother   . Cancer Brother        Lung  . Hypertension Brother   . Heart attack Brother   . Heart disease Brother        Aneurysm of Brain  . Hypertension Brother   . Heart disease Brother   . Heart disease Brother   . Stroke Son        Aneurysm of Stomach  . AAA (abdominal aortic  aneurysm) Son   . Cancer Maternal Uncle        great uncle/cancer/type unknown    Social history:  reports that she quit smoking about 17 years ago. Her smoking use included cigarettes. She has a 45.00 pack-year smoking history. She has never used smokeless tobacco. She reports that she does not drink alcohol or use drugs.  Medications:  Prior to Admission medications   Medication Sig Start Date End Date Taking? Authorizing Provider  acetaminophen (TYLENOL) 325 MG tablet Take 650 mg by  mouth every 6 (six) hours as needed for moderate pain or headache.    Yes [provider]  albuterol (VENTOLIN HFA) 108 (90 BASE) MCG/ACT inhaler Inhale 2 puffs into the lungs every 4 (four) hours as needed for wheezing or shortness of breath (((PLAN B))). 08/09/13  Yes Cater, Wynelle Bourgeois, MD  amLODipine-olmesartan (AZOR) 5-40 MG tablet Take 1 tablet by mouth daily.   Yes [provider]  aspirin 81 MG chewable tablet Chew 81 mg by mouth daily.   Yes [provider]  cetirizine HCl (ZYRTEC) 5 MG/5ML SOLN Take 5 mg by mouth 2 (two) times daily as needed for allergies.   Yes [provider]  clopidogrel (PLAVIX) 75 MG tablet Take 1 tablet (75 mg total) by mouth daily. 04/02/12  Yes Conrad Delmar, MD  dexlansoprazole (DEXILANT) 60 MG capsule Take 1 capsule (60 mg total) by mouth daily before breakfast. 11/17/12  Yes Tanda Rockers, MD  nitroGLYCERIN (NITROSTAT) 0.4 MG SL tablet Place 0.4 mg under the tongue every 5 (five) minutes as needed for chest pain.    Yes [provider]  prednisoLONE acetate (PRED FORTE) 1 % ophthalmic suspension Place 1 drop into the right eye every other day.  02/26/14  Yes [provider]  rosuvastatin (CRESTOR) 10 MG tablet Take 10 mg by mouth at bedtime.    Yes [provider]  spironolactone (ALDACTONE) 25 MG tablet Take 25 mg by mouth daily. 03/25/17  Yes [provider]  umeclidinium-vilanterol (ANORO ELLIPTA) 62.5-25  MCG/INH AEPB Inhale 1 puff into the lungs daily. 05/13/17  Yes Noralee Space, MD      Allergies  Allergen Reactions  . Hydromorphone Palpitations and Other (See Comments)    DILAUDID  -  Pt had a Heart Attack after taking Dilaudid.  . Levaquin [Levofloxacin] Other (See Comments)    Chest pressure, SOB, "pain in between shoulder blades", sweaty -as reported by patient per experience in ED this afternoon  . Azithromycin Other (See Comments)    Patient reported past history of lip swelling  . Codeine Other (See Comments)    Dr. Terrence Dupont advised patient not to take this medication  . Doxycycline Swelling and Other (See Comments)    Mouth, lips, feet swelling  . Oxycodone-Acetaminophen Other (See Comments)    Says it makes her feel weird  . Risedronate Other (See Comments)    Chest pain  . Avelox [Moxifloxacin Hcl In Nacl] Palpitations    ROS:  Out of a complete 14 system review of symptoms, the patient complains only of the following symptoms, and all other reviewed systems are negative.  Appetite change, chills, fatigue Facial swelling, ringing in ears, runny nose, difficulty swallowing Light sensitivity Cough, shortness of breath Leg swelling Excessive thirst Nausea Incontinence of the bladder Joint pain, back pain, neck stiffness, neck pain Bruising easily, anemia Dizziness, headache  Blood pressure (!) 160/50, pulse 70, height 5\' 2"  (1.575 m), weight 160 lb 8 oz (72.8 kg), SpO2 98 %.  Physical Exam  General: The patient is alert and cooperative at the time of the examination.  Eyes: Pupils are equal, round, and reactive to light. Discs are flat bilaterally.  Neck: The neck is supple, bilateral, right greater than left carotid bruits are noted.  Respiratory: The respiratory examination is clear.  Cardiovascular: The cardiovascular examination reveals a regular rate and rhythm, a grade II/VI systolic ejection murmur at the aortic area is noted.  Skin: Extremities are  without significant edema.  Neurologic Exam  Mental status: The patient is alert and oriented x 3 at the time of the examination. The patient has apparent normal recent and remote memory, with an apparently normal attention span and concentration ability.  Cranial nerves: Facial symmetry is present. There is good sensation of the face to pinprick and soft touch bilaterally. The strength of the facial muscles and the muscles to head turning and shoulder shrug are normal bilaterally. Speech is well enunciated, no aphasia or dysarthria is noted. Extraocular movements are full. Visual fields are full. The tongue is midline, and the patient has symmetric elevation of the soft palate. No obvious hearing deficits are noted.  Motor: The motor testing reveals 5 over 5 strength of all 4 extremities. Good symmetric motor tone is noted throughout.  Sensory: Sensory testing is intact to pinprick, soft touch, vibration sensation, and position sense on all 4 extremities, with exception some decrease in position sense in both feet. No evidence of extinction is noted.  Coordination: Cerebellar testing reveals good finger-nose-finger and heel-to-shin bilaterally.  Gait and station: Gait is normal. Tandem gait is slightly unsteady. Romberg is negative. No drift is seen.  Reflexes: Deep tendon reflexes are symmetric and normal bilaterally. Toes are downgoing bilaterally.   Assessment/Plan:  1.  Positional headache   2.  Bilateral carotid bruit  The patient has developed headache that comes on with sitting and standing, goes away with lying down.  This simulates a spinal headache, the patient may have a spontaneous spinal fluid leak.  The patient has had suboccipital craniotomy in the past, otherwise she has not had any spinal surgery.  She will undergo MRI with contrast, if this shows diffuse enhancement of the meninges, we may consider a total myelogram to look for the source of the spinal leak.  She will  follow-up in 3 months.  Jill Alexanders MD 04/25/2018 12:32 PM  [facility] [address] [place], [place] [address]  Phone [phone] Fax [phone]

## 2018-04-28 ENCOUNTER — Telehealth: Payer: Self-pay | Admitting: Neurology

## 2018-04-28 NOTE — Telephone Encounter (Signed)
[employer] Medicare order sent to GI. No auth they will reach out to the pt to schedule.  °

## 2018-05-01 ENCOUNTER — Telehealth: Payer: Self-pay | Admitting: *Deleted

## 2018-05-01 NOTE — Telephone Encounter (Signed)
[facility] called. I spoke with Sherian Rein. They wanted to verify pt needed MRI w/ contrast only. They called and spoke to our office 04/29/18 and we verified this then but she wanted to know when MRI w/o was completed. Explained it was completed via [facility] on 04/09/18. Can be viewed in care everywhere. She verbalized understanding. Nothing further needed.

## 2018-05-04 ENCOUNTER — Ambulatory Visit
Admission: RE | Admit: 2018-05-04 | Discharge: 2018-05-04 | Disposition: A | Discharge status: Home / Self Care | Payer: Medicare Other | Source: Ambulatory Visit | Attending: Neurology | Admitting: Neurology

## 2018-05-04 ENCOUNTER — Emergency Department (HOSPITAL_COMMUNITY): Payer: Medicare Other

## 2018-05-04 ENCOUNTER — Other Ambulatory Visit: Payer: Self-pay

## 2018-05-04 ENCOUNTER — Emergency Department (HOSPITAL_COMMUNITY)
Admission: EM | Admit: 2018-05-04 | Discharge: 2018-05-05 | Disposition: A | Discharge status: Home / Self Care | Payer: Medicare Other | Attending: Emergency Medicine | Admitting: Emergency Medicine

## 2018-05-04 ENCOUNTER — Encounter (HOSPITAL_COMMUNITY): Payer: Self-pay | Admitting: Emergency Medicine

## 2018-05-04 DIAGNOSIS — Z87891 Personal history of nicotine dependence: Secondary | ICD-10-CM | POA: Diagnosis not present

## 2018-05-04 DIAGNOSIS — I2510 Atherosclerotic heart disease of native coronary artery without angina pectoris: Secondary | ICD-10-CM | POA: Diagnosis not present

## 2018-05-04 DIAGNOSIS — R51 Headache: Secondary | ICD-10-CM | POA: Diagnosis not present

## 2018-05-04 DIAGNOSIS — Z7902 Long term (current) use of antithrombotics/antiplatelets: Secondary | ICD-10-CM | POA: Insufficient documentation

## 2018-05-04 DIAGNOSIS — Z79899 Other long term (current) drug therapy: Secondary | ICD-10-CM | POA: Insufficient documentation

## 2018-05-04 DIAGNOSIS — I10 Essential (primary) hypertension: Secondary | ICD-10-CM | POA: Diagnosis not present

## 2018-05-04 DIAGNOSIS — Q0700 Arnold-Chiari syndrome without spina bifida or hydrocephalus: Secondary | ICD-10-CM | POA: Diagnosis not present

## 2018-05-04 DIAGNOSIS — N289 Disorder of kidney and ureter, unspecified: Secondary | ICD-10-CM

## 2018-05-04 DIAGNOSIS — Z85118 Personal history of other malignant neoplasm of bronchus and lung: Secondary | ICD-10-CM | POA: Diagnosis not present

## 2018-05-04 DIAGNOSIS — E875 Hyperkalemia: Secondary | ICD-10-CM | POA: Insufficient documentation

## 2018-05-04 DIAGNOSIS — R0789 Other chest pain: Secondary | ICD-10-CM | POA: Diagnosis not present

## 2018-05-04 DIAGNOSIS — J449 Chronic obstructive pulmonary disease, unspecified: Secondary | ICD-10-CM | POA: Insufficient documentation

## 2018-05-04 DIAGNOSIS — G971 Other reaction to spinal and lumbar puncture: Secondary | ICD-10-CM

## 2018-05-04 DIAGNOSIS — Z951 Presence of aortocoronary bypass graft: Secondary | ICD-10-CM | POA: Insufficient documentation

## 2018-05-04 DIAGNOSIS — G4489 Other headache syndrome: Secondary | ICD-10-CM

## 2018-05-04 DIAGNOSIS — Z7982 Long term (current) use of aspirin: Secondary | ICD-10-CM | POA: Diagnosis not present

## 2018-05-04 LAB — CBC
HCT: 28.4 % — ABNORMAL LOW (ref 36.0–46.0)
Hemoglobin: 9.2 g/dL — ABNORMAL LOW (ref 12.0–15.0)
MCH: 31.1 pg (ref 26.0–34.0)
MCHC: 32.4 g/dL (ref 30.0–36.0)
MCV: 95.9 fL (ref 78.0–100.0)
Platelets: 309 K/uL (ref 150–400)
RBC: 2.96 MIL/uL — ABNORMAL LOW (ref 3.87–5.11)
RDW: 13.9 % (ref 11.5–15.5)
WBC: 8 K/uL (ref 4.0–10.5)

## 2018-05-04 LAB — BASIC METABOLIC PANEL
ANION GAP: 8 (ref 5–15)
BUN: 29 mg/dL — ABNORMAL HIGH (ref 8–23)
CO2: 23 mmol/L (ref 22–32)
Calcium: 9.5 mg/dL (ref 8.9–10.3)
Chloride: 110 mmol/L (ref 98–111)
Creatinine, Ser: 1.64 mg/dL — ABNORMAL HIGH (ref 0.44–1.00)
GFR, EST AFRICAN AMERICAN: 34 mL/min — AB (ref >60)
GFR, EST NON AFRICAN AMERICAN: 30 mL/min — AB (ref >60)
GLUCOSE: 101 mg/dL — AB (ref 70–99)
POTASSIUM: 5.9 mmol/L — AB (ref 3.5–5.1)
Sodium: 141 mmol/L (ref 135–145)

## 2018-05-04 MED ORDER — ACETAMINOPHEN 500 MG PO TABS
1000.0000 mg | ORAL_TABLET | Freq: Once | ORAL | Status: AC
  Administered 2018-05-04: 1000 mg via ORAL
  Filled 2018-05-04: qty 2

## 2018-05-04 NOTE — ED Triage Notes (Signed)
Pt c/o R mid / upper thoracic back pain x 3 days. Pt states she has had an XR and was to have an MRI today but pt unable to have MRI because of her kidney functions. Pt currently being treated for lung cancer.

## 2018-05-04 NOTE — ED Notes (Signed)
Pt states primary dr says she can not take any pain medicine stronger than tylenol due to the effects on her body.

## 2018-05-05 ENCOUNTER — Telehealth: Payer: Self-pay | Admitting: *Deleted

## 2018-05-05 ENCOUNTER — Telehealth: Payer: Self-pay | Admitting: Nurse Practitioner

## 2018-05-05 ENCOUNTER — Other Ambulatory Visit: Payer: Self-pay | Admitting: Neurology

## 2018-05-05 ENCOUNTER — Telehealth: Payer: Self-pay | Admitting: Neurology

## 2018-05-05 DIAGNOSIS — G971 Other reaction to spinal and lumbar puncture: Secondary | ICD-10-CM

## 2018-05-05 NOTE — Telephone Encounter (Signed)
Took call from phone staff. Spoke with Lauren from [facility] in MRI department. She stated pt came yesterday for MRI w/ contrast but it was cx d/t her GFR lab being 30.

## 2018-05-05 NOTE — Telephone Encounter (Signed)
I called the patient.  The MRI with contrast could not be done, blood work showed a GFR of 30.  I will get a complete myelogram to look for evidence of a spinal fluid leak, if none is found, we will begin with epidural blood patch procedures to see if the symptoms may abate.

## 2018-05-05 NOTE — ED Provider Notes (Addendum)
[place] DEPT Provider Note   CSN: [id number] Arrival date & time: 05/04/18  1557     History   Chief Complaint Chief Complaint  Patient presents with  . Back Pain    HPI [name] is a 77 y.o. female.  Patient with a history of right lateral pleuritic-like chest pain chest wall pain.  No history of any injury.  Been ongoing for 1 week.  Patient today was supposed to have an MRI brain with contrast ordered by [facility] neurology following some previous brain surgery.  She had a regular MRI of her brain earlier in the summer and has had a CT of her chest for follow-up of lung cancer by oncology.  No significant abnormalities of either one.  Patient's main concern today is that right-sided chest pain.  Patient was not able to have her MRI with contrast due to poor renal function.  So information was sent to go for neurology and it was canceled.  Patient states that her kidney function was not well enough to do it with contrast.  Patient without any anterior chest pain no hypoxia no tachycardia.  Patient's been having some difficulty with chronic headaches.  That was the reason for the MRI brain by [facility] neurology.     Past Medical History:  Diagnosis Date  . 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 Asthma   . 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  . Blood in stool    last week of aug 2018  . Carotid artery occlusion   . Complication of anesthesia   . COPD (chronic obstructive pulmonary disease) ([place])   . Coronary artery disease   . 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 Diverticulitis   . Dyspnea    with exertion  . Gastroesophageal reflux disease    occ  . Glaucoma    right eye  . Headache    tension  . Hiatal hernia   . Hyperlipidemia   . Hypertension   . Iliac artery aneurysm ([place])   . 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 Myocardial infarction [facility]) 01/01/2000   Cardiac catheterization  . Peripheral vascular disease ([place])    stents in legs x 2 or 3  . Pneumonia    last time winter 2017 -2018  . PONV (postoperative nausea and vomiting)    occassionally, last colonscopy did ok with anesthesia  . Reflux     Patient Active Problem List   Diagnosis Date Noted  . Costochondritis, acute 04/18/2018  . Hyperkalemia 02/06/2017  . Physical deconditioning 11/08/2016  . Acute lower GI bleeding   . BRBPR (bright red blood per rectum) 09/15/2016  . Iron deficiency anemia 06/07/2016  . Bronchitis, acute 06/07/2016  . 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 Primary cancer of right lower lobe of lung ([place]) 04/25/2016  . 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 Foot pain, right 10/24/2015  . CAP (community acquired pneumonia) 10/22/2015  . Prolonged QT interval 10/22/2015  . UTI (urinary tract infection) 10/22/2015  . COPD exacerbation ([place]) 10/22/2015  . Neck pain on right side 01/05/2015  . Chest pain 08/02/2014  . Intractable headache 08/02/2014  . HLD (hyperlipidemia) 08/02/2014  . Essential hypertension 08/02/2014  . GERD (gastroesophageal reflux disease) 08/02/2014  . Leg pain, right 08/02/2014  . HA (headache)   . Carotid artery stenosis 12/09/2013  .  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  . Dehydration 04/02/2013  . CAD (coronary artery disease) 04/02/2013  . AKI (acute kidney injury) ([place]) 04/01/2013  . Nausea & vomiting 04/01/2013  . Diarrhea 04/01/2013  . Hypokalemia 04/01/2013  . Hyponatremia 04/01/2013  . Peripheral vascular disease ([place]) 01/07/2013  . Occlusion and stenosis of carotid artery without mention of cerebral infarction 01/07/2013  . Dizziness and giddiness 01/07/2013  . Shortness of breath 01/07/2013  . Cough 11/17/2012  . COPD GOLD II 11/17/2012    Past Surgical History:  Procedure Laterality Date  . ABDOMINAL HYSTERECTOMY    . APPENDECTOMY    . Arnold-chiari  malformation repair  1998   Suboccipital craniectomy  . CAROTID ENDARTERECTOMY  03/29/2010   Left  CEA  . CHOLECYSTECTOMY     Gall Bladder  . COLONOSCOPY WITH PROPOFOL N/A 04/22/2015   Procedure: COLONOSCOPY WITH PROPOFOL;  Surgeon: Carol Ada, MD;  Location: [facility] ENDOSCOPY;  Service: Endoscopy;  Laterality: N/A;  . COLONOSCOPY WITH PROPOFOL N/A 05/25/2016   Procedure: COLONOSCOPY WITH PROPOFOL;  Surgeon: Carol Ada, MD;  Location: [facility] ENDOSCOPY;  Service: Endoscopy;  Laterality: N/A;  . COLONOSCOPY WITH PROPOFOL N/A 05/03/2017   Procedure: COLONOSCOPY WITH PROPOFOL;  Surgeon: Carol Ada, MD;  Location: [facility] ENDOSCOPY;  Service: Endoscopy;  Laterality: N/A;  . CORNEAL TRANSPLANT     Right  . CORONARY ARTERY BYPASS GRAFT  01/01/2000   x 3  . ENTEROSCOPY N/A 02/27/2018   Procedure: ENTEROSCOPY;  Surgeon: Carol Ada, MD;  Location: [facility] ENDOSCOPY;  Service: Endoscopy;  Laterality: N/A;  . ESOPHAGOGASTRODUODENOSCOPY N/A 05/25/2016   Procedure: ESOPHAGOGASTRODUODENOSCOPY (EGD);  Surgeon: Carol Ada, MD;  Location: [facility];  Service: Endoscopy;  Laterality: N/A;  . EYE SURGERY Right 1995 or 1996   Laser surgery for retinal hemorrhage  . HOT HEMOSTASIS N/A 02/27/2018   Procedure: HOT HEMOSTASIS (ARGON PLASMA COAGULATION/BICAP);  Surgeon: Carol Ada, MD;  Location: [facility];  Service: Endoscopy;  Laterality: N/A;  . IR RADIOLOGIST EVAL & MGMT  12/14/2016  . LEFT HEART CATH AND CORS/GRAFTS ANGIOGRAPHY N/A 01/09/2018   Procedure: LEFT HEART CATH AND CORS/GRAFTS ANGIOGRAPHY;  Surgeon: Charolette Forward, MD;  Location: [facility] CV LAB;  Service: Cardiovascular;  Laterality: N/A;  . LEFT HEART CATHETERIZATION WITH CORONARY ANGIOGRAM N/A 08/03/2014   Procedure: LEFT HEART CATHETERIZATION WITH CORONARY ANGIOGRAM;  Surgeon: Birdie Riddle, MD;  Location: [facility] CATH LAB;  Service: Cardiovascular;  Laterality: N/A;  . Post Coronary Artery  BPG  01/05/2000   Right jugular sheath removed  . PR VEIN  BYPASS GRAFT,AORTO-FEM-POP    . ROTATOR CUFF REPAIR     Right     OB History   None      Home Medications    Prior to Admission medications   Medication Sig Start Date End Date Taking? Authorizing Provider  acetaminophen (TYLENOL) 325 MG tablet Take 650 mg by mouth every 6 (six) hours as needed for moderate pain or headache.    Yes [provider]  albuterol (VENTOLIN HFA) 108 (90 BASE) MCG/ACT inhaler Inhale 2 puffs into the lungs every 4 (four) hours as needed for wheezing or shortness of breath (((PLAN B))). 08/09/13  Yes Cater, Wynelle Bourgeois, MD  amLODipine-olmesartan (AZOR) 5-40 MG tablet Take 1 tablet by mouth daily.   Yes [provider]  aspirin 81 MG chewable tablet Chew 81 mg by mouth daily.   Yes [provider]  cetirizine HCl (ZYRTEC) 5 MG/5ML SOLN Take 5 mg by mouth 2 (two) times  daily as needed for allergies.   Yes [provider]  clopidogrel (PLAVIX) 75 MG tablet Take 1 tablet (75 mg total) by mouth daily. 04/02/12  Yes Conrad Dry Prong, MD  dexlansoprazole (DEXILANT) 60 MG capsule Take 1 capsule (60 mg total) by mouth daily before breakfast. 11/17/12  Yes Tanda Rockers, MD  nitroGLYCERIN (NITROSTAT) 0.4 MG SL tablet Place 0.4 mg under the tongue every 5 (five) minutes as needed for chest pain.    Yes [provider]  prednisoLONE acetate (PRED FORTE) 1 % ophthalmic suspension Place 1 drop into the right eye every other day.  02/26/14  Yes [provider]  rosuvastatin (CRESTOR) 10 MG tablet Take 10 mg by mouth at bedtime.    Yes [provider]  spironolactone (ALDACTONE) 25 MG tablet Take 25 mg by mouth daily. 03/25/17  Yes [provider]  umeclidinium-vilanterol (ANORO ELLIPTA) 62.5-25 MCG/INH AEPB Inhale 1 puff into the lungs daily. 05/13/17  Yes Noralee Space, MD    Family History Family History  Problem Relation Age of Onset  . Heart disease Mother        Heart Disease before age 80  . Hypertension  Mother   . Hyperlipidemia Mother   . Heart attack Mother   . Clotting disorder Mother   . Heart disease Father        Heart Disease before age 1  . Heart attack Father   . Hyperlipidemia Father   . Hypertension Father   . Heart disease Brother        Heart Disease before age 56  . Hyperlipidemia Brother   . Hypertension Brother   . Clotting disorder Brother   . AAA (abdominal aortic aneurysm) Brother   . Cerebral aneurysm Sister   . Hypertension Sister   . AAA (abdominal aortic aneurysm) Sister   . Asthma Sister   . Cerebral aneurysm Brother   . Cancer Brother        Lung  . Hypertension Brother   . Heart attack Brother   . Heart disease Brother        Aneurysm of Brain  . Hypertension Brother   . Heart disease Brother   . Heart disease Brother   . Stroke Son        Aneurysm of Stomach  . AAA (abdominal aortic aneurysm) Son   . Cancer Maternal Uncle        great uncle/cancer/type unknown    Social History Social History   Tobacco Use  . Smoking status: Former Smoker    Packs/day: 1.50    Years: 30.00    Pack years: 45.00    Types: Cigarettes    Last attempt to quit: 08/13/2000    Years since quitting: 17.7  . Smokeless tobacco: Never Used  Substance Use Topics  . Alcohol use: No    Alcohol/week: 0.0 standard drinks  . Drug use: No     Allergies   Hydromorphone; Levaquin [levofloxacin]; Azithromycin; Codeine; Doxycycline; Oxycodone-acetaminophen; Risedronate; and Avelox [moxifloxacin hcl in nacl]   Review of Systems Review of Systems  Constitutional: Negative for fever.  HENT: Negative for congestion.   Eyes: Negative for visual disturbance.  Respiratory: Negative for shortness of breath.   Cardiovascular: Positive for chest pain. Negative for leg swelling.  Gastrointestinal: Negative for abdominal pain.  Genitourinary: Negative for dysuria.  Musculoskeletal: Negative for back pain and neck stiffness.  Skin: Negative for rash.  Neurological:  Positive for headaches.  Hematological: Does not bruise/bleed  easily.     Physical Exam Updated Vital Signs BP (!) 143/55 (BP Location: Left Arm)   Pulse (!) 56   Temp 97.9 F (36.6 C)   Resp 17   SpO2 96%   Physical Exam  Constitutional: She is oriented to person, place, and time. She appears well-developed and well-nourished. No distress.  HENT:  Head: Normocephalic and atraumatic.  Mouth/Throat: Oropharynx is clear and moist.  Eyes: Pupils are equal, round, and reactive to light. Conjunctivae are normal.  Neck: Neck supple.  Cardiovascular: Normal rate, regular rhythm and normal heart sounds.  Pulmonary/Chest: Effort normal and breath sounds normal. No respiratory distress. She has no wheezes. She exhibits no tenderness.  Abdominal: Soft. Bowel sounds are normal. There is no tenderness.  Musculoskeletal: Normal range of motion.  Neurological: She is alert and oriented to person, place, and time. No cranial nerve deficit or sensory deficit. She exhibits normal muscle tone. Coordination normal.  Skin: Skin is warm. No rash noted.  Nursing note and vitals reviewed.    ED Treatments / Results  Labs (all labs ordered are listed, but only abnormal results are displayed) Labs Reviewed  CBC - Abnormal; Notable for the following components:      Result Value   RBC 2.96 (*)    Hemoglobin 9.2 (*)    HCT 28.4 (*)    All other components within normal limits  BASIC METABOLIC PANEL - Abnormal; Notable for the following components:   Potassium 5.9 (*)    Glucose, Bld 101 (*)    BUN 29 (*)    Creatinine, Ser 1.64 (*)    GFR calc non Af Amer 30 (*)    GFR calc Af Amer 34 (*)    All other components within normal limits    EKG EKG Interpretation  Date/Time:  Monday May 05 2018 00:08:12 EDT Ventricular Rate:  56 PR Interval:    QRS Duration: 84 QT Interval:  421 QTC Calculation: 407 R Axis:   63 Text Interpretation:  Sinus rhythm No significant change since last  tracing Confirmed by Fredia Sorrow [id number]) on 05/05/2018 12:14:38 AM   Radiology Dg Ribs Unilateral W/chest Right  Result Date: 05/04/2018 CLINICAL DATA:  Right chest and upper back pain. EXAM: RIGHT RIBS AND CHEST - 3+ VIEW COMPARISON:  None. FINDINGS: Prior median sternotomy and CABG. No visible rib fracture or focal rib lesion. No confluent airspace opacity effusion or pneumothorax. Heart is normal size. IMPRESSION: No acute cardiopulmonary disease.  No visible rib fracture. Electronically Signed   By: Rolm Baptise M.D.   On: 05/04/2018 21:47   Ct Chest Wo Contrast  Result Date: 05/04/2018 CLINICAL DATA:  77 year old female with chest pain and shortness of breath. Patient being treated for lung cancer. EXAM: CT CHEST WITHOUT CONTRAST TECHNIQUE: Multidetector CT imaging of the chest was performed following the standard protocol without IV contrast. COMPARISON:  Chest radiograph dated 05/04/2018 and chest CT dated 02/21/2018 FINDINGS: Evaluation of this exam is limited in the absence of intravenous contrast. Cardiovascular: There is no cardiomegaly or pericardial effusion. There is advanced coronary vascular calcification involving the LAD and changes of CABG and stent. There is hypoattenuation of the cardiac blood pool suggestive of a degree of anemia. Clinical correlation is recommended. There is mild atherosclerotic calcification of the thoracic aorta. The central pulmonary arteries are grossly unremarkable on this noncontrast CT. Mediastinum/Nodes: There is no hilar or mediastinal adenopathy. The esophagus and the thyroid gland are grossly unremarkable. No mediastinal fluid collection. Lungs/Pleura:  There is an 11 x 13 mm focus of calcification in the right upper lobe anteriorly similar to prior CT. A wedge-shaped area of pleural based ground-glass density in the right lower lobe appears similar to prior CT. There is a 7 mm nodular density within this ground-glass area stable from prior CT by my  measurement. No new consolidative changes. There is no pleural effusion or pneumothorax. Additional 3 mm right middle lobe subpleural nodule (series 5, image 61) stable. Right lung base linear atelectasis/scarring. Upper Abdomen: Cholecystectomy. The visualized upper abdomen is otherwise unremarkable. Musculoskeletal: Median sternotomy wires. No acute osseous pathology. IMPRESSION: No acute intrathoracic pathology. Similar appearance of the right lower lobe ground-glass attenuation and stable small nodular component. Overall no interval change in the appearance of the lung findings since the prior CT. Electronically Signed   By: Anner Crete M.D.   On: 05/04/2018 23:43    Procedures Procedures (including critical care time)  Medications Ordered in ED Medications  acetaminophen (TYLENOL) tablet 1,000 mg (1,000 mg Oral Given 05/04/18 1955)     Initial Impression / Assessment and Plan / ED Course  I have reviewed the triage vital signs and the nursing notes.  Pertinent labs & imaging results that were available during my care of the patient were reviewed by me and considered in my medical decision making (see chart for details).     Patient's labs showed some worsening in her renal function.  GFR's were less than 40.  Presume that they were low this morning although we cannot see those lab results.  Is why they cannot do the MRI with contrast.  Patient here clinically stable no tachycardia no hypoxia.  Patient due to the past history of lung CA certainly at risk for pulmonary embolus.  Clinically does not seem to have that.  However patient cannot have CT with contrast.  Regular CT was done which shows no significant changes anything in her lungs compared to the summer.  PE not completely ruled out but clinically patient seems to be stable.   Patient's potassium was also elevated at 5.9.  Patient's had potassium is elevated 5.8 in the past.  They have just monitored it and she is been told to  watch her potassium intake.  She is not on oral potassium supplements.  EKG ordered to see if there is any acute changes.  Offered patient Kayexalate she does not want that.  If EKG is no acute changes and patient is able to get close follow-up with her primary care doctor for follow-up of the potassium.  Patient last seen hematology urology in July.  Patient had no evidence of any recurrent lung CA at that time.  Chest x-ray and rib series today were negative as mentioned CT of the chest was done without contrast.  No significant changes from previous ones.  Patient's EKG shows no evidence of any peak T waves or widened QRS.  It is possible that the potassium was secondary to hemolysis.  I will discharge patient home for follow-up with her primary care doctor we will have her return for any new pain shortness of breath or any racing heart.  As mentioned pulmonary and was not completely ruled out but clinically it is unlikely and patient not interested in pursuing that.  Final Clinical Impressions(s) / ED Diagnoses   Final diagnoses:  Chest wall pain  Hyperkalemia  Renal insufficiency    ED Discharge Orders    None       Fredia Sorrow,  MD 05/05/18 2947    Fredia Sorrow, MD 05/05/18 6546

## 2018-05-05 NOTE — Discharge Instructions (Addendum)
As we discussed close follow-up with your primary care doctor for recheck of your kidney function and for your potassium.  Would recommend following up this week.  Avoid bananas and dark leafy green vegetables.  Return for any new or worse symptoms.  Exact cause of the discomfort on the right side of the chest is not clear.  EKG had no acute changes.  Blood clot to the lungs not completely ruled out.

## 2018-05-05 NOTE — Telephone Encounter (Signed)
Phone call to patient to verify medication list and allergies for myelogram procedure. Pt instructed she will need to hold plavix 5 days prior to myelogram appointment time (pending approval from cardiologist Dr Terrence Dupont). Pt verbalized understanding. Faxed thinner hold request, awaiting approval.

## 2018-05-05 NOTE — Telephone Encounter (Signed)
[employer] Medicare order sent to GI. No auth they will reach out to the pt to schedule.  °

## 2018-05-19 ENCOUNTER — Ambulatory Visit
Admission: RE | Admit: 2018-05-19 | Discharge: 2018-05-19 | Disposition: A | Discharge status: Home / Self Care | Payer: Medicare Other | Source: Ambulatory Visit | Attending: Neurology | Admitting: Neurology

## 2018-05-19 ENCOUNTER — Telehealth: Payer: Self-pay | Admitting: Neurology

## 2018-05-19 DIAGNOSIS — G971 Other reaction to spinal and lumbar puncture: Secondary | ICD-10-CM

## 2018-05-19 MED ORDER — DIAZEPAM 5 MG PO TABS
5.0000 mg | ORAL_TABLET | Freq: Once | ORAL | Status: AC
  Administered 2018-05-19: 5 mg via ORAL

## 2018-05-19 MED ORDER — IOPAMIDOL (ISOVUE-M 300) INJECTION 61%
10.0000 mL | Freq: Once | INTRAMUSCULAR | Status: AC | PRN
  Administered 2018-05-19: 10 mL via INTRATHECAL

## 2018-05-19 NOTE — Telephone Encounter (Signed)
I called the patient.  The myelogram did not show a potential source of a spinal fluid leak.  The patient indicates that her headaches are still daily, still are positional, but are improved as compared to what they had been.  She can take Tylenol and improve the headache.  The patient does report some clear drainage from her nasal passageways but it is not a lot of fluid and is not persistent drainage.   The patient believes that her headaches are improving over time, we will watch conservatively at this point.   Total myelogram 05/19/18:  IMPRESSION: Mild multilevel spondylosis without significant spinal stenosis or dominant area of neural impingement.  Good opacification of the entire subarachnoid space of the spinal neuraxis, with no visible spinal fluid leak, nor concerning areas of CSF diverticulum formation.  Status post suboccipital craniectomy for Chiari I malformation. No pseudomeningocele or suboccipital CSF leak. No residual tonsillar impaction, nor areas concerning for cord enlargement/hydromyelia.

## 2018-05-19 NOTE — Discharge Instructions (Signed)

## 2018-06-23 ENCOUNTER — Ambulatory Visit (INDEPENDENT_AMBULATORY_CARE_PROVIDER_SITE_OTHER): Payer: Medicare Other | Admitting: Pulmonary Disease

## 2018-06-23 ENCOUNTER — Encounter: Payer: Self-pay | Admitting: Pulmonary Disease

## 2018-06-23 VITALS — BP 136/64 | HR 71 | Temp 97.8°F | Ht 62.0 in | Wt 158.6 lb

## 2018-06-23 DIAGNOSIS — Z8619 Personal history of other infectious and parasitic diseases: Secondary | ICD-10-CM

## 2018-06-23 DIAGNOSIS — R0789 Other chest pain: Secondary | ICD-10-CM | POA: Diagnosis not present

## 2018-06-23 DIAGNOSIS — M792 Neuralgia and neuritis, unspecified: Secondary | ICD-10-CM | POA: Diagnosis not present

## 2018-06-23 HISTORY — DX: Personal history of other infectious and parasitic diseases: Z86.19

## 2018-06-23 MED ORDER — GABAPENTIN 100 MG PO CAPS: 100.0000 mg | ORAL_CAPSULE | Freq: Three times a day (TID) | ORAL | 0 refills | Status: DC

## 2018-06-23 NOTE — Patient Instructions (Signed)
Today we updated your med list in our EPIC system...    Continue your current medications the same...  Your right sided chest wall pain correlates exactly to the distribution of your shingles rash from several months ago...  We are starting GABAPENTIN 100mg  three times daily for the neuralgia but this will need to be adjusted by your Primary Care Doctor or your Neurologist...   Call for any questions.Marland KitchenMarland Kitchen

## 2018-06-23 NOTE — Progress Notes (Addendum)
Subjective:     Patient ID: [name], female   DOB: 1940-10-06, 77 y.o.   MRN: [id number]  HPI  ~  October 28, 2015:  Initial pulmonary consult by SN>  PCP is DrBouska    37 y/o WF, [name], w/ hx COPD, HBP, CAD-s/p 3 vessel CABG, severe ASPVD, HL, HH/GERD, Divertics, etc...    She was prev evaluated by DrWert in 2014 & DrGonzalez before that>  She had mult medication intolerances and seemed to do better w/ NEBS; also had hx chr sinusitis & GERD...     Daughter called w/ request for a post-hosp f/u visit>  She was [name] 3/11 - 10/24/15 by [facility] w/ CAP, COPD exacerbation;  She presented w/ cough, small amt yellow sput, temp to 101, and incr SOB;  ER eval revealed T101.3, & exam w/ decr BS +wheezes and basilar rhonchi;  CXR showed patchy RLL airsp dis, Flu panel neg, unable to get sput for culture, Labs- Cr=1.27, BS~140-190, Hg~10-11 w/ MCV=83;  She was treated w/ O2, IVF, Duoneb, Levaquin & Pred;  Disch on Duoneb via nebulizer Qid, Dulera100-2spBid, Doxy100Bid, Pred taper...     Currently she feels back to baseline w/ sl cough, white sput & no discoloration or blood, +DOE w/ exertion and some ADLs, no CP, palpit, dizzy, edema;  On NEB w/ DUONEB Qid, DULERA100-2spBid, Pred10mg  tapering sched from recent [place] as needed, Zyrtek prn...  Smoking Hx>  Ex-smoker- started age 41, smoked for ~40 yrs up to 1+ppd, and quit in 2002 when she had CABG; total = 45+pack-year smoking history...   Pulmonary Hx>  Hx "asthma", COPD, ex-smoker, hx recurrent bronchits, hx pneumonia 3/17  Medical Hx>  HBP, CAD- s/p 3 vessel CABG in 2002, severe ASPVD w/ Ao-Fem-Pop & left CAE 2011, 44mm right MCA aneurysm, HH/GERD/ Divertics, colon polyps, HAs (s/p craniotomy for Arnold-chiari malformation)  Family Hx>  One sis w/ asthma, otherw all atherosclerosis related...  Occup Hx>  No known asbestos or toxic exposures  Current Meds>  ASA/Plavix, AZOR 5/40, Catapress0.1-1/2Bid, Crestor10, Dexilant60,  (not on DM meds- "I'm pre-diabetic")... EXAM shows Afeb, VSS, O2sat=98% on RA; Wt=174#, 5'2"Tall, BMI=31;  HEENT- neg, mallampati1, bilat CBruits & L-CAE scar;  Chest- decrBS, clear w/o w/r/r;  Heart- CABG scar, RR Gr1-2/6 SEM w/o r/g;  Abd- +epig bruit, w/o masses;  Ext- w/o c/ce;  Neuro- w/o focal deficits...   PF reported from 2013>  FEV1=1.30 (58%), %1sec=65, no change post bronchdil, DLCO=74%  CT Angio Chest 08/02/14 showed NEG for PE, extensive CAD- s/p CABG, subsolid nodules in RML/ RLL pleural thickening & bibasilar atx, hepatic steatosis, no adenopathy...  CXR 10/22/15 showed norm heart size, s/p CABG, patchy airsp dis in right base, incr interstitial markings diffusely, biapical scarring...   CXR 10/28/15 shows resolved patchy RLL pneumonia, norm heart size, s/p CABG, atherosclerotic changes in Ao, etc...   Spirometry 10/28/15>  FVC=1.62 (63%), FEV1=1.19 (60%), %1sec=73, mid-flows reduced at 48% predicted;  This is c/w a mild obstructive ventilatory defect & GOLD Stage2 COPD, can't r/o superimposed restriction w/o LV measurement.  Ambulatory Oximetry 10/28/15> O2sat=100% on RA at rest w/ pulse=61;  Pt ambulated 3Laps in the office w/ lowest O2sat=99% w/ pulse=73/min...   LABS reviewed in EPIC>  She needs A1c, Anemia labs, TSH if not checked in the interval by DrBoushka... IMP >>    COPD, GOLD Stage 2 disease    Ex-smoker-- quit 2002, 45+pack-year smoking hx    CAP 3/17-- NOS, resolved w/ Levaquin,  Doxy, but she claims allergic to everything...    Cardiac issues>  HBP, CAD- s/p 3 vessel CABG in 2002, severe ASPVD w/ left CAE 2011, 85mm right MCA aneurysm-- followed by [name], [facility]- DrDickson, IR-DrDeveshwar...    Medical issues>  HBP, HL, pre-diabetes, HH/GERD/ Divertics, colon polyps, HAs (s/p craniotomy for Arnold-Chiari malformation)-- followed by DrBoushka & DrWillis... PLAN >>     We discussed her COPD and recent hosp for CAP;  She is asked to use the NEBULIZER w/ DUONEB  Tid regularly, DULERA100-2spBid, and MUCINEX 600mg  1-2tabs bid w/ fluids... She will maintain regular follow up w/ her numerous physicians and plan pulm recheck in 6-8 wks...   ~  Dec 12, 2015:  6wk ROV w/ SN>  [name] returns on her NEBULIZER w/ Duoneb Qid, Dulera100-2spBid, & AlbutHFA rescue inhaler as needed; she has finished her prev Pred taper and notes that her breathing is improved & she feels back to baseline- sl cough, small amt whitish sput, min end-exp wheezing, and chr stable DOE, she denies CP/ hemoptysis/ chest tightness/ f/c/s, etc...     COPD, GOLD Stage 2 disease>  Back to baseline on her NEB w/ Duoneb Qid, Dulera100-2spBid, AlbutHFA rescue as needed; Rec to continue same + gradual exercise program & work on wt reduction...    Ex-smoker-- quit 2002, 45+pack-year smoking hx    CAP 3/17-- NOS, resolved w/ Levaquin, Doxy, but she claims allergic to everything...    Cardiac issues>  HBP, CAD- s/p 3 vessel CABG in 2002, severe ASPVD w/ left CAE 2011, 86mm right MCA aneurysm-- followed by [name], [facility]- DrDickson, IR-DrDeveshwar...    Medical issues>  HBP, HL, borderline diabetes, HH/GERD/ Divertics, colon polyps, HAs (s/p craniotomy for Arnold-Chiari malformation), Anemia-- followed by DrBoushka & DrWillis... EXAM shows Afeb, VSS, O2sat=98% on RA; Wt=172#, 5'2"Tall, BMI=31;  HEENT- neg, mallampati1, bilat CBruits & L-CAE scar;  Chest- decrBS, clear w/o w/r/r;  Heart- CABG scar, RR Gr1-2/6 SEM w/o r/g;  Abd- +epig bruit, w/o masses;  Ext- w/o c/ce;  Neuro- w/o focal deficits...  IMP/PLAN>>  [name] is approaching her baseline, s/p pneumonia 10/2015;  Asked to continue NEBs Tid-Qid, Dulera Bid, & rescue inhaler as needed;  She will maintain general med follow up w/ DrBouska, Cards w/ DrHarwani, [facility]- DrDickson, and Neuro- DrWillis;  We plan ROV recheck 3 months...  ADDENDUM>>  [name] went to the ER 02/24/16 c/o nausea, vomiting, & diarrhea assoc w/ some crampy abd pain; nothing acute was  found on exam & eval- given Zofran w/ improvement; she also reported SOB & CXR 02/24/16 showed norm heart size, s/pCABG, hyperexpanded lungs, vague nodular opac in left base & right perihilar areas, otherw clear w/o pneumonia or edema;  She followed up w/ DrBoushka & had a CT Chest done 02/28/16 showing scarring in the apicies, and a dominant spiculated 48mm posterior RLL nodule (prev CTA in 2015 showed it at 36mm), several other tiny opacities noted, no adenopathy;  A subseq PET-CT 03/14/16 confirmed a hypermetabolic RLL nodule & no other hypermet lesions seen;  She had a needle Bx 04/11/16=> pos for squamous cell ca...     She was seen by DrBartle for TSurg but he felt that she was not an operative candidate based on her COPD & severe vascular pathology & suggested SBRT;  She has since been evaluated by [facility] for RadOn[facility] & she is pending the start of XRT at this time...   FullPFTs 04/10/16 showed FVC=1.53 (57%), FEV1=1.09 (54%), %1sec=71, mid-flows reduced at 42%  predicted; post bronchodil FEV1 improved 17% to 1.28L;  TLC=3.77 (79%), RV=2.07 (96%), RV/TLC=55%;  DLCO=51% predicted and the DL/VA=86% predicted...  ~  June 18, 2016:  46mo ROV w/ SN>  & [name] returns stating "I'm tired of doctors"- not feeling well, tired, freezing, giving out w/ min activ & ADLs, SOB all the time, etc... I've reviewed the recent Epic data>>    She was eval by DrBartle 04/04/16> not felt to be a surg candidate due to her severe vasc dis & underlying COPD; needle bx of RLL lesion was pos for Squamous Cell Ca=> referred for XRT & seen by DrManning...    She completed her XRT w/ DrManning & was seen 05/28/16>  s/p curative SBRT- 54 Gy in 3 fractions (05/2016) & tol well...    She saw DrHung, GI for anemia w/u w/ EGD/ Colon 05/25/16>  EGD was WNL;  Colon was also WNL...    She was seen in [place] 06/04/16 w/ cough & right sided CP x1wk, note reviewed, CXR/ CT Angio/ LABS- all below; she was not given antibiotic & disch for f/u by  PCP...    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     We reviewed the following updated medical problems during today's office visit >>     COPD, GOLD Stage 2 disease (w/ revers component)>  Back to baseline on her NEB w/ Duoneb Qid (she is only doing it prn "I don't need it"), Dulera100-2spBid, AlbutHFA rescue as needed; Rec to continue same + gradual exercise program & work on wt reduction...    RLL Pulm nodule=> Bx 04/11/16= Squamous cell ca & pt eval by DrBartle, DrMohamed, DrManning w/ SBRT given 05/2016 (54 Gy in 3 fractions)...    Ex-smoker-- quit 2002, 45+pack-year smoking hx    CAP 3/17-- NOS, resolved w/ Levaquin, Doxy, but she claims allergic to everything; she also had bronchitis 05/2016 treated w/ Augmentiun & improved...    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    Medical issues>  HBP, HL, borderline diabetes, HH/GERD/ Divertics, colon polyps, HAs (s/p craniotomy for Arnold-Chiari malformation), Anemia-- followed by DrBoushka & DrWillis... EXAM shows Afeb, VSS, O2sat=100% on RA; Wt=173#, 5'2"Tall, BMI=31;  HEENT- neg, mallampati1, bilat CBruits & L-CAE scar;  Chest- decrBS, clear w/o w/r/r;  Heart- CABG scar, RR Gr1-2/6 SEM w/o r/g;  Abd- +epig bruit, w/o masses;  Ext- w/o c/ce;  Neuro- w/o focal deficits...   CXR 06/04/16>  Norm heart size & prior CABG, 1.4cm RLL nodule, no infiltrate or effusion- NAD...   CT Angio Chest 06/04/16>  s/p CABG & diffuse aortic calcif & heavy  coronary calcif, NEG for PE, stable RLL pulm nodule, no adenopathy, chr changes in the lungs bilat...  LABS 05/2016>  CBC- Hg=9.1, mcv=74;  Chems- Cr=1.30;  Fe=27 (7%sat),  Ferritin=8;  B12=546 IMP/PLAN>>  [name] has been thru a lot but improved after SBRT for RLL squamous cell ca, recent Feraheme for anemia, and Augmentin for bronchitis; she  is encouraged to stay on the [facility] Bid & use the NEBS as needed; definitely needs to incr physical activity & get her weight down... we plan rov in 72mo for pulm check...  ~  November 08, 2016:  4-49mo ROV & post hosp visit/ pulmonary follow up>  [name] is followed by PCP- DrBouska & has bee seen 7 times over the interval;  She was HOSP 2/3 - 09/17/16 w/ BRB per rectum assoc w/ mild lower abd pain; prev eval by GI (DrHung) w/ EGD & Colonoscopy 05/2016 done for IDA (both were NEG), capsule endo was rec but not done;  CT Abd & Pelvis was neg for ischemic colitis;  ASA & Plavix was held & she improved- ?etiology (poss divertic bleed)... We reviewed the following medical problems during today's office visit >>     COPD, GOLD Stage 2 disease (w/ revers component)>  Back to baseline on her NEB w/ Duoneb Qid (she is only doing it prn "I don't need it"), Dulera100-2spBid, AlbutHFA rescue as needed; Rec to continue same + gradual exercise program & work on wt reduction... She was seen [facility] 07/14/16 w/ bronchitic exac(dyspnea, cough, r-rib pain), treated w/ Amox & Toradol for pain... She is too sedentary & SOB w/ ADLs...     RLL Pulm nodule=> Bx 04/11/16= Squamous cell ca & pt eval by DrBartle, DrMohamed, DrManning w/ SBRT given 05/2016 (54 Gy in 3 fractions); Last seen by DrMohamed 08/08/16- Stage 1A non-small-cell lung ca, RLL nodule s/p XRT, & persistent anemia=> given Feraheme 06/2016;  CT Chest 07/2016 showed decr size ofnodule from 35mm to 73mm, no sign mets; DrM rec f/u scan in 42mo...    Ex-smoker-- quit 2002, 45+pack-year smoking hx    CAP 3/17-- NOS, resolved w/ Levaquin,  Doxy, but she claims allergic to everything; she also had bronchitis 05/2016 treated w/ Augmentiun & improved...    Cardiac issues>  HBP, CAD- s/p 3 vessel CABG in 2002, severe ASPVD w/ bilat SFA stents 2003 & left CAE 2011, 26mm right MCA aneurysm-- followed by [name], [facility]- DrDickson (07/2016), IR-DrDeveshwar... CDoppler 07/2016 showed 40-59% R-ICA stenosis & patent L-CAE site w/o restenosis...    Medical issues>  HBP, HL, borderline diabetes, HH/GERD/ Divertics, colon polyps, HAs (s/p craniotomy for Arnold-Chiari malformation), Anemia-- followed by DrBoushka & DrWillis... EXAM shows Afeb, VSS, O2sat=99% on RA; Wt=168#, 5'2"Tall, BMI=31;  HEENT- neg, mallampati1, bilat CBruits & L-CAE scar;  Chest- decrBS, clear w/o w/r/r;  Heart- CABG scar, RR Gr1-2/6 SEM w/o r/g;  Abd- +epig bruit, w/o masses;  Ext- w/o c/ce;  Neuro- w/o focal deficits...   CXR 07/14/16>  S/p CABG, prev RLL nodule not ell vis, stable biapical scarring, NAD...   CT Angio Chest 07/14/16>  NEG for PE, atherosclerosis of Ao/ coronaries/ great vessels; s/p median sternotomy & CABG; no adenopathy, RLL nodule decr to 31mm (prev 45mm), stable scarring lin lingula & LLL, no new lesions...   CXR 11/08/16 (independently reviewed by me in the PACS system) showed norm heart size, s/p CABG, no adenopathy, stable ill-defined RLL nodular opacity (s/p XRT)...  LABS 11/08/16>  CBC- Hg=12.2, mcv=90, Fe=97 (26%sat), Ferritin=59,  TSH=2.17... IMP/PLAN>>  REC to use her NEB (Duoneb) Tid followed by [id number]- 2spBid and start a regular exercise program; continue follow up w/ her PCP-DrBouska & mult specialty physicians...   ~  March 14, 2017:  30mo ROV & Caylin reports that she is "doing just fine"- she had pneumonia x 1 in the interval & wants to blame the  Neb rx and Advair "I don't care what I do, I get pneumonia" and "DrBouska says I'm, his problem child";  It is clear that she is going to REFUSE the NEBS and ADVAIR going forward despite our  conversation to the contrary...  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   CT Chest 01/28/17>  Norm heart size w/ atherosclerosis in coronaries, Ao & branch vessels; no adenopathy; lungs show RLL nodule measures 8x37mm (similar to prev), bilat patchy airsp opac w/ nodular morphology & some tree-in-bud morphology...    She saw PA-Perkins at XRT 01/22/17>  Stage 1A SCCa RLL w/ XRT completed 05/28/16 (54 Gy delivered in 3 fractions of 18Gy each); note reviewed- treated by PCP for left sided pneumonia, and f/u CT w/ stability of the RLL nodule.    She's had mult f/u visits w/ PCP- DrBouska>  Records in Care Everywhere reviewed--  Back pain treated w/ Pred & improved, he presented in June w/ fever to 101, chills, sore throat, deep cough & SOB;  CXR showed patchy LLL opac (confirmed on subseq CT 6/18), and WBC=12.7;  She was treated w/ Augmentin=> later changed to SeptraDS due to ?rash, & improved...   CXR 01/21/17 by [facility], avail for review in PACS system>  Focal patchy infiltrate in LLL medially... We reviewed the following medical problems during today's office visit >>     COPD, GOLD Stage 2 disease (w/ revers component)>  She wants to blame her 6/18 LLL pneumonia on her nebulizer & Advair=> refuses to use these going forward! (the truth is that she refuses meds due to cost);  We discussed using the Duoneb prn and trying ANORO one inhalation daily;  She is still too sedentary & NOT motivated to exercise- needs referral to [place] program & she is encouraged to participate!    RLL Pulm nodule=> Bx 04/11/16= Squamous cell ca  & pt eval by DrBartle, DrMohamed, DrManning w/ SBRT given 05/2016 (54 Gy in 3 fractions); Last seen by DrMohamed 01/2017- Stage 1A non-small-cell lung ca, RLL nodule s/p XRT, & persistent anemia=> given Feraheme 06/2016;  CT Chest f/u 6/18 showed stable size of nodule ~7-66mm, no sign mets; DrM rec f/u scan in 53mo...    Ex-smoker-- quit 2002, 45+pack-year smoking hx    CAP 3/17 & 6/18-- NOS, 1st resolved w/ Levaquin, Doxy, but she claims allergic to everything; 2nd resolved w/ Augmentin, SeptraDS; she also had bronchitis 05/2016 treated w/ Augmentiun & improved...    Cardiac issues>  HBP, CAD- s/p 3 vessel CABG in 2002, severe ASPVD w/ bilat SFA stents 2003 & left CAE 2011, 11mm right MCA aneurysm-- followed by [name], [facility]- DrDickson (07/2016), IR-DrDeveshwar... CDoppler 07/2016 showed 40-59% R-ICA stenosis & patent L-CAE site w/o restenosis...    Medical issues>  HBP, HL, borderline diabetes, HH/GERD/ Divertics, colon polyps, HAs (s/p craniotomy for Arnold-Chiari malformation), Anemia-- followed by DrBoushka & DrWillis... EXAM shows Afeb, VSS, O2sat=97% on RA; Wt=163#, 5'2"Tall, BMI=30;  HEENT- neg, mallampati1, bilat CBruits & L-CAE scar;  Chest- decrBS, clear w/o w/r/r;  Heart- CABG scar, RR Gr1-2/6 SEM  w/o r/g;  Abd- +epig bruit, w/o masses;  Ext- w/o c/ce;  Neuro- w/o focal deficits...   LABS 01/2017>  Chems- ok w/ BS=110, Cr=1.3, LFTs wnl;  CBC- Hg=10.8 IMP/PLAN>>  Maja refuses NEBs and Advair-- try ANORO one inhalation daily & encouraged to use the NEB vs rescue inhaler prn;  She desperately need to incr exercise program & can't vs won't do it on her own-- refer to [place] [facility];  We plan recheck in 4-97mo...  ~  July 15, 2017:  63mo ROV & [name] indicates that "I'm doing good" on Anoro one inhalation daily + Neb vs rescue inhaler as needed;  She notes sl cough, min amt clear sput (no color & no blood), and SOB/DOE the same as before (no change she says); she is still NOT  exercising & claims that Pulm Rehab never called her (and she never called [place] back to tell us so);  She has a f/u appt w/ drMohamed this month w/ CT Chest planned...  We reviewed the following interval notes avail to [place] in [place] Everywhere>      She saw PCP- DrBouska on 04/30/17> medical f/u w/ hx recent GIB- seen by [facility] & sched for colonoscopy; hx CAD- s/p CABGx3, HBP, HL; note reviewed- given 2018 Flu vaccine...     She saw GI- DrHung on 05/03/17 for colonoscopy>  Normal colonoscopy- no lesions seen, prev colon done in 2016 showed 4 adenomas...  We reviewed the following medical problems during today's office visit >>     COPD, GOLD Stage 2 disease (w/ revers component)>  She wants to blame her 6/18 LLL pneumonia on her nebulizer & Advair=> refuses to use these going forward;  We discussed using the Duoneb prn and trying ANORO one inhalation daily;  She is still too sedentary & NOT motivated to exercise- needs referral to [place] program & she is encouraged to participate!    RLL Pulm nodule=> Bx 04/11/16= Squamous cell ca & pt eval by DrBartle, DrMohamed, DrManning w/ SBRT given 05/2016 (54 Gy in 3 fractions); Last seen by DrMohamed 01/2017- Stage 1A non-small-cell lung ca, RLL nodule s/p XRT, & persistent anemia=> given Feraheme 06/2016;  CT Chest f/u 6/18 showed stable size of nodule ~7-42mm, no sign mets; DrM rec f/u scan in 64mo...    Ex-smoker-- quit 2002, 45+pack-year smoking hx    CAP 3/17 & 6/18-- NOS, 1st resolved w/ Levaquin, Doxy, but she claims allergic to everything; 2nd resolved w/ Augmentin, SeptraDS; she also had bronchitis 05/2016 treated w/ Augmentiun & improved...    Cardiac issues>  HBP, CAD- s/p 3 vessel CABG in 2002, severe ASPVD w/ bilat SFA stents 2003 & left CAE 2011, 107mm right MCA aneurysm-- followed by [name], [facility]- DrDickson (07/2016), IR-DrDeveshwar... On ASA/ Plavix, Azor, Catapres, Aldactone, Crestor; CDoppler 07/2016 showed 40-59% R-ICA stenosis & patent  L-CAE site w/o restenosis...    Medical issues>  HBP, HL, borderline diabetes, HH/GERD (on Dexilant)/ Divertics, colon polyps, HAs (s/p craniotomy for Arnold-Chiari malformation), Anemia-- followed by DrBoushka & DrWillis... EXAM shows Afeb, VSS, O2sat=99% on RA; Wt=157#, 5'2"Tall, BMI=29-30;  HEENT- neg, mallampati1, bilat CBruits & L-CAE scar;  Chest- decrBS, clear w/o w/r/r;  Heart- CABG scar, RR Gr1-2/6 SEM w/o r/g;  Abd- +epig bruit, w/o masses;  Ext- w/o c/ce;  Neuro- w/o focal deficits...  IMP/PLAN>>  Aydee is stable fom the pulmonary standpoint; she has not been exercising & is still in need of a regular exercise program- refer  to Pulm Rehab at [facility];  Rec to continue Anoro regularly & AlbuterolHFA as needed...   ~  ADDENDUM>>  CT CHEST 08/22/17>  norm heart size, s/p median sternotomy & CABG, aortic & coronary calcif; no adenopathy, resolved LLL airspace dis, GG attenuation w/ nodularity in RLL has 85mm nodule (prev 61mm)stable partially calcif nodule along R-minor fissure, biapical pleuroparenchymal scarring... They plan recheck in 95mo.   ~  February 10, 2018:  74mo ROV & [name] complains that her breathing is worse & she is more SOB;  Notes mild cough, small amt clear sput, no hemoptysis; notes DOE w/ ADLs, houswork, ambulation; she is not exercising and prev attempts at getting her into [facility] were unsuccessful- she says classes were full but the issue was the expense involved... She is stable on her regimen of NEBS "prn" and AlbutHFA prn when out & about, ANORO one inhalation daily... We reviewed the following interval notes in Epic-EMR>      She saw [facility]- DrDickson on 08/14/17>  Hx bilat iliac art stents, prev L-CAE (2011); mult somatic complaints w/ HA, back pain, leg pain, etc but NOT felt to have any claudication; Iliac stent duplex eval showed intact bilat common iliac art stents and Left ext iliac art stent (some stenosis noted prox to this stent), ABIs are good 95-100%;  Carotid  Duplex showed L-CAE site is widely patent, but Right carotid shows prev 40-59% stenosis has progressed to 60-79% on this study (no intervention planned until >80%; they plan recheck in 6mo;  She was again asked to incr exercise program...    She had f/u w/ ONCOLOGY- Dr Julien Nordmann on 08/26/17>  S/p 1A non-small cell lung cancer (Squamous cell) in RLL (treated w/ sterotactic radiotherapy to RLL by DrManning), and hx IDA/ anemia of chr dis (treated w. Feraheme infusions 06/2016);  Follow up Chest CT 08/22/17 as above- NAD, RLL nodular opac is smaller than prev- rec f/u 44mo...     She had numerous f/u visits w/ PCP- DrBouska on 09/27/17 & 11/26/17>  Seen for cough plus upper resp congestion, rhinorrhea, coryza, Flu-neg, treated w/ empiric Amox and she reported resolved;  Also had left shoulder pain & was evaluation & given a subacromial bursa injection...     She saw [facility]ARD[place]edra [place] in [id number]>  Hx CAD w/ PTCA & stent to LAD & 1st diagonal in past; then she had 3 vessel CABG in 2002; known ASPVD w/ iliac stenting & L-CAE by [facility] as well; c/o 5/10 CP w/ nausea & sweating, had abn myoview and subseq CATH showing 30-40% L[place], 70% in-stent LAD, LIMA to LAD was patent, occluded 1st diagonal stent, 40% Circ, saphanous vein graft to OM & RCA are occluded=> med management per [facility]... We reviewed the following medical problems during today's office visit >>     COPD, GOLD Stage 2-3 disease (w/ a revers component)>  She wants to blame her 6/18 LLL pneumonia on her nebulizer & Advair=> refuses to use these going forward;  We discussed using the Duoneb prn and trying ANORO one inhalation daily;  She is still too sedentary & NOT motivated to exercise- needs referral to [place] program & she is encouraged to participate!    RLL Pulm nodule=> Bx 04/11/16= Squamous cell ca & pt eval by DrBartle, DrMohamed, DrManning w/ SBRT given 05/2016 (54 Gy in 3 fractions); Last seen by DrMohamed 61/2019- Stage 1A non-small-cell lung ca, RLL  nodule s/p XRT, & persistent anemia=> given Feraheme 06/2016;  CT Chest  f/u 01/28/2017 & 08/22/2017 showed sl smaller size of nodule ~7-48mm, no sign mets; DrM rec continued follow up...    Ex-smoker-- quit 2002, 45+pack-year smoking hx    CAP 3/17 & 6/18-- NOS, 1st resolved w/ Levaquin, Doxy, but she claims allergic to everything; 2nd resolved w/ Augmentin, SeptraDS; she also had bronchitis 05/2016 treated w/ Augmentiun & improved...    Cardiac issues>  HBP, CAD- s/p 3 vessel CABG in 2002, severe ASPVD w/ bilat SFA stents 2003 & left CAE 2011, 15mm right MCA aneurysm-- followed by [name], VVS- DrDickson (07/2016), IR-DrDeveshwar... On ASA/ Plavix, Azor 5/40, Aldactone25, Crestor10; CDoppler 08/2017 showed 60-79% R-ICA stenosis & patent L-CAE site w/o restenosis; they plan f/u 49mo due to the progression on the right...    Medical issues>  HBP, HL, borderline diabetes (diet alone), HH/GERD (on Dexilant)/ Divertics, colon polyps, HAs (s/p craniotomy for Arnold-Chiari malformation), small 43mm R-MCA aneurysm (stable on Brain MRA 01/2018), Anemia-- followed by DrBoushka & DrWillis... EXAM shows Afeb, VSS, O2sat=99% on RA; Wt=156#, 5'2"Tall, BMI=29-30;  HEENT- neg, mallampati1, bilat CBruits & L-CAE scar;  Chest- decrBS, clear w/o w/r/r;  Heart- CABG scar, RR Gr1-2/6 SEM w/o r/g;  Abd- +epig bruit, w/o masses;  Ext- w/o c/ce;  Neuro- w/o focal deficits...   Labs 08/2017 showed> Chems- ok w/ K=5.2, BS=100=3, Cr=1.41, LFTs wnl;  CBC w/ Hg=10.1, mcv=93, WBC=6.9 w/ norm diff & 4% eos;   IMP/PLAN>>  [name] is recommended to use her NEBULIZER w/ Duoneb 2-3x/d regularly & continue her ANORO once daily;  She desperately needs to consider something like PulmRehab but she is clearly not motivated & declines to pursue this- she cancelled several appts w/ them & says it was too $$ (alternatively asked to get into CARDIAC rehab w/ [facility]);  She will maintain her regular follow up appts w/ PCP- DrBouska, CARDS-  DrHarwani, ONOLOGY- DrMohamed, & VVS- DrDickson... We plan rov recheck in 61mo, sooner if needed for breathing problems...    ~  June 23, 2018:  53mo ROV & add-on appt requested for right sided chest pain>  Pt presents c/o "right lung pain" noted worse w/ breathing deep, moving, soreness "I'm so sore in my lungs";  When I had her show me where the pain was she described a dermatome distribution along the right chest wall around under her breast area- no rash or scaring present in the skin but she recalled being treated for shingles "several months ago";  A chart review showed one appt w/ DrBouska on 05/27/17 (more than several months, it was 65yr) with a rash precisely in this area & she was treated w/ Valtrex 1000mg Tid for 7d (no steroids given);  The rash resolved per pt & there is no mention of the prev shingles rash in any of the follow up office visits;  Interestingly- she started c/o right sided pain (?RUQ, right chest wall) about 67mo later & periodically over the last year -- 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    Ex-smoker-- quit 2002, 45+pack-year smoking hx    CAP 3/17 & 6/18-- NOS, 1st resolved w/ Levaquin, Doxy, but she claims allergic to everything; 2nd resolved w/ Augmentin, SeptraDS; she also had bronchitis 05/2016 treated w/ Augmentiun & improved...    RIGHT CHEST WALL PAIN=> likely post herpetic neuralgia, right T5-6 distrib, started on GABAPENTIN 100mg  Tid 06/2018... EXAM shows Afeb, VSS, O2sat=99% on RA, Wt=158# & BMI=29;  HEENT- neg, mallampati1, bilat CBruits & L-CAE scar;  Chest- no rash or scarring in skin, +tender tom palp in right T5-6 distrib, decrBS, clear w/o w/r/r;  Heart- CABG scar, RR Gr1/6 SEM w/o r/g;  Abd- +epig bruit, w/o masses;  Ext- w/o c/ce;  Neuro- w/o focal deficits...  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  We also discussed my upcoming retirement and we will set her up to see one of my young partners for her pulmonary issues...    Past Medical History:  Diagnosis Date  . 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 Arnold-Chiari malformation ([place]) 1998  . Asthma   . 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  . Blood in stool    last week of aug 2018  . Carotid artery occlusion   . Complication of anesthesia   . COPD (chronic obstructive pulmonary disease) ([place])   . Coronary artery disease   . 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 Diverticulitis   . Dyspnea    with exertion  . Gastroesophageal reflux disease    occ  . Glaucoma    right eye  . Headache    tension  . Hiatal hernia   . Hyperlipidemia   . Hypertension   . Iliac artery aneurysm ([place])   . Lung  cancer ([place]) dx 2018   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  . Myocardial infarction [facility]) 01/01/2000   Cardiac catheterization  . Peripheral vascular disease ([place])    stents in legs x 2 or 3  . Pneumonia    last time winter 2017 -2018  . PONV (postoperative nausea and vomiting)    occassionally, last colonscopy did ok with anesthesia  . Reflux     Past Surgical History:  Procedure Laterality Date  . ABDOMINAL HYSTERECTOMY    . APPENDECTOMY    . Arnold-chiari malformation repair  1998   Suboccipital craniectomy  . CAROTID ENDARTERECTOMY  03/29/2010   Left  CEA  . CHOLECYSTECTOMY     Gall Bladder  . COLONOSCOPY WITH PROPOFOL N/A 04/22/2015   Procedure: COLONOSCOPY WITH PROPOFOL;  Surgeon: Carol Ada, MD;  Location: [facility];  Service: Endoscopy;  Laterality: N/A;  . COLONOSCOPY WITH PROPOFOL N/A 05/25/2016  Procedure: COLONOSCOPY WITH PROPOFOL;  Surgeon: Carol Ada, MD;  Location: [facility];  Service: Endoscopy;  Laterality: N/A;  . COLONOSCOPY WITH PROPOFOL N/A 05/03/2017   Procedure: COLONOSCOPY WITH PROPOFOL;  Surgeon: Carol Ada, MD;  Location: [facility];  Service: Endoscopy;  Laterality: N/A;  . CORNEAL TRANSPLANT     Right  . CORONARY ARTERY BYPASS GRAFT  01/01/2000   x 3  . ENTEROSCOPY N/A 02/27/2018   Procedure: ENTEROSCOPY;  Surgeon: Carol Ada, MD;  Location: [facility];  Service: Endoscopy;  Laterality: N/A;  . ESOPHAGOGASTRODUODENOSCOPY N/A 05/25/2016   Procedure: ESOPHAGOGASTRODUODENOSCOPY (EGD);  Surgeon: Carol Ada, MD;  Location: [facility];  Service: Endoscopy;  Laterality: N/A;  . EYE SURGERY Right 1995 or 1996   Laser surgery for retinal hemorrhage  . HOT HEMOSTASIS N/A 02/27/2018   Procedure: HOT HEMOSTASIS (ARGON PLASMA COAGULATION/BICAP);  Surgeon: Carol Ada, MD;  Location: [facility];  Service: Endoscopy;  Laterality: N/A;  . IR RADIOLOGIST EVAL & MGMT  12/14/2016  . LEFT HEART CATH AND CORS/GRAFTS ANGIOGRAPHY N/A  01/09/2018   Procedure: LEFT HEART CATH AND CORS/GRAFTS ANGIOGRAPHY;  Surgeon: Charolette Forward, MD;  Location: [place] CV LAB;  Service: Cardiovascular;  Laterality: N/A;  . LEFT HEART CATHETERIZATION WITH CORONARY ANGIOGRAM N/A 08/03/2014   Procedure: LEFT HEART CATHETERIZATION WITH CORONARY ANGIOGRAM;  Surgeon: Birdie Riddle, MD;  Location: [place] CATH LAB;  Service: Cardiovascular;  Laterality: N/A;  . Post Coronary Artery  BPG  01/05/2000   Right jugular sheath removed  . PR VEIN BYPASS GRAFT,AORTO-FEM-POP    . ROTATOR CUFF REPAIR     Right    Outpatient Encounter Medications as of 06/23/2018  Medication Sig  . acetaminophen (TYLENOL) 325 MG tablet Take 650 mg by mouth every 6 (six) hours as needed for moderate pain or headache.   . albuterol (VENTOLIN HFA) 108 (90 BASE) MCG/ACT inhaler Inhale 2 puffs into the lungs every 4 (four) hours as needed for wheezing or shortness of breath (((PLAN B))).  Marland Kitchen amLODipine-olmesartan (AZOR) 5-40 MG tablet Take 1 tablet by mouth daily.  Marland Kitchen aspirin 81 MG chewable tablet Chew 81 mg by mouth daily.  . cetirizine HCl (ZYRTEC) 5 MG/5ML SOLN Take 5 mg by mouth 2 (two) times daily as needed for allergies.  Marland Kitchen clopidogrel (PLAVIX) 75 MG tablet Take 1 tablet (75 mg total) by mouth daily.  Marland Kitchen dexlansoprazole (DEXILANT) 60 MG capsule Take 1 capsule (60 mg total) by mouth daily before breakfast.  . nitroGLYCERIN (NITROSTAT) 0.4 MG SL tablet Place 0.4 mg under the tongue every 5 (five) minutes as needed for chest pain.   . prednisoLONE acetate (PRED FORTE) 1 % ophthalmic suspension Place 1 drop into the right eye every other day.   . rosuvastatin (CRESTOR) 10 MG tablet Take 10 mg by mouth at bedtime.   Marland Kitchen spironolactone (ALDACTONE) 25 MG tablet Take 25 mg by mouth daily.  Marland Kitchen umeclidinium-vilanterol (ANORO ELLIPTA) 62.5-25 MCG/INH AEPB Inhale 1 puff into the lungs daily.  Marland Kitchen gabapentin (NEURONTIN) 100 MG capsule Take 1 capsule (100 mg total) by mouth 3 (three) times daily.    No facility-administered encounter medications on file as of 06/23/2018.     Allergies  Allergen Reactions  . Hydromorphone Palpitations and Other (See Comments)    DILAUDID  -  Pt had a Heart Attack after taking Dilaudid.  . Levaquin [Levofloxacin] Other (See Comments)    Chest pressure, SOB, "pain in between shoulder blades", sweaty -as reported by patient per experience in ED  this afternoon  . Azithromycin Other (See Comments)    Patient reported past history of lip swelling  . Codeine Other (See Comments)    Dr. Terrence Dupont advised patient not to take this medication  . Doxycycline Swelling and Other (See Comments)    Mouth, lips, feet swelling  . Oxycodone-Acetaminophen Other (See Comments)    Says it makes her feel weird  . Risedronate Other (See Comments)    Chest pain  . Avelox [Moxifloxacin Hcl In Nacl] Palpitations    Immunization History  Administered Date(s) Administered  . Influenza Split 05/15/2012  . Influenza Whole 05/13/2012  . Influenza, High Dose Seasonal PF 05/22/2013, 07/13/2014, 05/13/2018  . Influenza,inj,Quad PF,6+ Mos 05/15/2012, 04/14/2015, 05/18/2015, 04/10/2016, 04/30/2017  . Pneumococcal Conjugate-13 05/15/2012  . Pneumococcal Polysaccharide-23 11/30/2014    Current Medications, Allergies, Past Medical History, Past Surgical History, Family History, and Social History were reviewed in [employer] record.   Review of Systems             All symptoms NEG except where BOLDED >>  Constitutional:  F/C/S, fatigue, anorexia, unexpected weight change. HEENT:  HA, visual changes, hearing loss, earache, nasal symptoms, sore throat, mouth sores, hoarseness. Resp:  cough, sputum, hemoptysis; SOB, tightness, wheezing. Cardio:  CP, palpit, DOE, orthopnea, edema. GI:  N/V/D/C, blood in stool; reflux, abd pain, distention, gas. GU:  dysuria, freq, urgency, hematuria, flank pain, voiding difficulty. MS:  joint pain, swelling, tenderness,  decr ROM; neck pain, back pain, etc. Neuro:  HA, tremors, seizures, dizziness, syncope, weakness, numbness, gait abn. Skin:  suspicious lesions or skin rash. Heme:  adenopathy, bruising, bleeding. Psyche:  confusion, agitation, sleep disturbance, hallucinations, anxiety, depression suicidal.   Objective:   Physical Exam       Vital Signs:  Reviewed...   General:  WD, overweight, 77 y/o WF in NAD; alert & oriented; pleasant & cooperative... HEENT:  [place]/AT; Conjunctiva- pink, Sclera- nonicteric, EOM-wnl, PERRLA, Fundi-benign; EACs-clear, TMs-wnl; NOSE-clear; THROAT-clear & wnl.  Neck:  Supple w/ decr ROM; no JVD; s/p L-CAE, bilat bruits; no thyromegaly or nodules palpated; no lymphadenopathy.  Chest:  decr BS, clear to P & A; without wheezes, rales, or rhonchi heard. Heart:  Regular Rhythm; norm S1 & S2, Gr1-2/6 SEM, w/o rubs or gallops detected. Abdomen:  Soft & nontender- no guarding or rebound; normal bowel sounds; no organomegaly or masses palpated. Ext:  decrROM; without deformities +arthritic changes; no varicose veins, +venous insuffic, no edema;  Pulses intact w/o bruits. Neuro:  No focal neuro deficits, +gait abn... Derm:  No lesions noted; no rash etc. Lymph:  No cervical, supraclavicular, axillary, or inguinal adenopathy palpated.   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  We also discussed my upcoming retirement and we will set her up to see one of my young partners for her pulmonary issues   IMP >>    COPD, GOLD Stage 2 disease>  Back to baseline on her NEB w/ Duoneb Qid, Dulera100-2spBid, AlbutHFA rescue  as needed; Rec to continue same + gradual exercise program & work on wt reduction...    RLL pulm nodule=> Squamous  cell ca (Bx 03/2016), s/p XRT 05/2016, followed by [facility] & DrMohamed...    Ex-smoker-- quit 2002, 45+pack-year smoking hx    CAP 3/17-- NOS, resolved w/ Levaquin, Doxy, but she claims allergic to everything...    Cardiac issues>  HBP, CAD- s/p 3 vessel CABG in 2002, severe ASPVD w/ left CAE 2011, 38mm right MCA aneurysm-- followed by [name], VVS- DrDickson, IR-DrDeveshwar...    Medical issues>  HBP, HL, borderline diabetes, HH/GERD/ Divertics, colon polyps, HAs (s/p craniotomy for Arnold-Chiari malformation), Anemia-- followed by DrBoushka & DrWillis...  PLAN >>  10/28/15>   We discussed her COPD and recent hosp for CAP;  She is asked to use the NEBULIZER w/ DUONEB Tid regularly, DULERA100-2spBid, and MUCINEX 600mg  1-2tabs bid w/ fluids... She will maintain regular follow up w/ her numerous physicians and plan pulm recheck in 6-8 wks. 12/12/15>   [name] is approaching her baseline, s/p pneumonia 10/2015;  Asked to continue NEBs Tid-Qid, Dulera Bid, & rescue inhaler as needed;  She will maintain general med follow up w/ DrBouska, Cards w/ DrHarwani, VVS- DrDickson, and Neuro- DrWillis;  We plan ROV recheck 3 months 06/18/16>  [name] has been thru a lot but improved after SBRT for RLL squamous cell ca, recent Feraheme for anemia, and Augmentin for bronchitis; she is encouraged to stay on the [facility] Bid & use the NEBS as needed; definitely needs to incr physical activity & get her weight down... we plan rov in 45mo for pulm check... 11/08/16>   REC to use her NEB (Duoneb) Tid followed by [id number]- 2spBid and start a regular exercise program; continue follow up w/ her PCP-DrBouska & mult specialty physicians... 03/14/17>   [name] refuses NEBs and Advair-- try ANORO one inhalation daily & encouraged to use the NEB vs rescue inhaler prn;  She desperately need to incr exercise program &  can't vs won't do it on her own-- refer to [place] [facility];  We plan recheck in 4-29mo... 07/15/17>   [name] is stable fom the pulmonary standpoint; she has not been exercising & is still in need of a regular exercise program- refer to [place] [facility];  Rec to continue Anoro regularly & AlbuterolHFA as needed. 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  We also discussed my upcoming retirement and we will set her up to see one of my young partners for her pulmonary issues   Plan:     Patient's Medications  New Prescriptions   GABAPENTIN (NEURONTIN) 100 MG CAPSULE    Take 1 capsule (100 mg total) by mouth 3 (three) times daily.  Previous Medications   ACETAMINOPHEN (TYLENOL) 325 MG TABLET    Take 650 mg by mouth every 6 (six) hours as needed for moderate pain or headache.    ALBUTEROL (VENTOLIN HFA) 108 (90 BASE) MCG/ACT INHALER    Inhale 2 puffs into the lungs every 4 (four) hours as needed for wheezing or shortness of breath (((PLAN B))).   AMLODIPINE-OLMESARTAN (AZOR) 5-40 MG TABLET    Take 1 tablet by mouth daily.   ASPIRIN 81 MG CHEWABLE TABLET    Chew 81 mg by mouth daily.  CETIRIZINE HCL (ZYRTEC) 5 MG/5ML SOLN    Take 5 mg by mouth 2 (two) times daily as needed for allergies.   CLOPIDOGREL (PLAVIX) 75 MG TABLET    Take 1 tablet (75 mg total) by mouth daily.   DEXLANSOPRAZOLE (DEXILANT) 60 MG CAPSULE    Take 1 capsule (60 mg total) by mouth daily before breakfast.   NITROGLYCERIN (NITROSTAT) 0.4 MG SL TABLET    Place 0.4 mg under the tongue every 5 (five) minutes as needed for chest pain.    PREDNISOLONE ACETATE (PRED FORTE) 1 %  OPHTHALMIC SUSPENSION    Place 1 drop into the right eye every other day.    ROSUVASTATIN (CRESTOR) 10 MG TABLET    Take 10 mg by mouth at bedtime.    SPIRONOLACTONE (ALDACTONE) 25 MG TABLET    Take 25 mg by mouth daily.   UMECLIDINIUM-VILANTEROL (ANORO ELLIPTA) 62.5-25 MCG/INH AEPB    Inhale 1 puff into the lungs daily.  Modified Medications   No medications on file  Discontinued Medications   No medications on file

## 2018-06-26 ENCOUNTER — Telehealth: Payer: Self-pay | Admitting: Medical Oncology

## 2018-06-26 NOTE — Telephone Encounter (Signed)
CT

## 2018-07-14 ENCOUNTER — Encounter (HOSPITAL_COMMUNITY): Payer: Self-pay | Admitting: Emergency Medicine

## 2018-07-14 ENCOUNTER — Other Ambulatory Visit: Payer: Self-pay

## 2018-07-14 ENCOUNTER — Inpatient Hospital Stay (HOSPITAL_COMMUNITY)
Admission: EM | Admit: 2018-07-14 | Discharge: 2018-07-18 | DRG: 378 | Disposition: A | Discharge status: Home / Self Care | Payer: Medicare Other | Attending: Internal Medicine | Admitting: Internal Medicine

## 2018-07-14 DIAGNOSIS — K219 Gastro-esophageal reflux disease without esophagitis: Secondary | ICD-10-CM | POA: Diagnosis present

## 2018-07-14 DIAGNOSIS — Z951 Presence of aortocoronary bypass graft: Secondary | ICD-10-CM | POA: Diagnosis not present

## 2018-07-14 DIAGNOSIS — D500 Iron deficiency anemia secondary to blood loss (chronic): Secondary | ICD-10-CM | POA: Diagnosis not present

## 2018-07-14 DIAGNOSIS — Z90710 Acquired absence of both cervix and uterus: Secondary | ICD-10-CM

## 2018-07-14 DIAGNOSIS — I6523 Occlusion and stenosis of bilateral carotid arteries: Secondary | ICD-10-CM | POA: Diagnosis not present

## 2018-07-14 DIAGNOSIS — I739 Peripheral vascular disease, unspecified: Secondary | ICD-10-CM | POA: Diagnosis not present

## 2018-07-14 DIAGNOSIS — Z923 Personal history of irradiation: Secondary | ICD-10-CM

## 2018-07-14 DIAGNOSIS — Z7982 Long term (current) use of aspirin: Secondary | ICD-10-CM | POA: Diagnosis not present

## 2018-07-14 DIAGNOSIS — I252 Old myocardial infarction: Secondary | ICD-10-CM | POA: Diagnosis not present

## 2018-07-14 DIAGNOSIS — I2510 Atherosclerotic heart disease of native coronary artery without angina pectoris: Secondary | ICD-10-CM | POA: Diagnosis not present

## 2018-07-14 DIAGNOSIS — I10 Essential (primary) hypertension: Secondary | ICD-10-CM | POA: Diagnosis present

## 2018-07-14 DIAGNOSIS — K922 Gastrointestinal hemorrhage, unspecified: Secondary | ICD-10-CM | POA: Diagnosis present

## 2018-07-14 DIAGNOSIS — E785 Hyperlipidemia, unspecified: Secondary | ICD-10-CM | POA: Diagnosis present

## 2018-07-14 DIAGNOSIS — I129 Hypertensive chronic kidney disease with stage 1 through stage 4 chronic kidney disease, or unspecified chronic kidney disease: Secondary | ICD-10-CM | POA: Diagnosis not present

## 2018-07-14 DIAGNOSIS — Z7902 Long term (current) use of antithrombotics/antiplatelets: Secondary | ICD-10-CM

## 2018-07-14 DIAGNOSIS — C3491 Malignant neoplasm of unspecified part of right bronchus or lung: Secondary | ICD-10-CM | POA: Diagnosis not present

## 2018-07-14 DIAGNOSIS — I6529 Occlusion and stenosis of unspecified carotid artery: Secondary | ICD-10-CM | POA: Diagnosis present

## 2018-07-14 DIAGNOSIS — Z79899 Other long term (current) drug therapy: Secondary | ICD-10-CM

## 2018-07-14 DIAGNOSIS — K31811 Angiodysplasia of stomach and duodenum with bleeding: Secondary | ICD-10-CM | POA: Diagnosis not present

## 2018-07-14 DIAGNOSIS — J449 Chronic obstructive pulmonary disease, unspecified: Secondary | ICD-10-CM | POA: Diagnosis present

## 2018-07-14 DIAGNOSIS — D509 Iron deficiency anemia, unspecified: Secondary | ICD-10-CM | POA: Diagnosis present

## 2018-07-14 DIAGNOSIS — N183 Chronic kidney disease, stage 3 (moderate): Secondary | ICD-10-CM | POA: Diagnosis not present

## 2018-07-14 DIAGNOSIS — Z87891 Personal history of nicotine dependence: Secondary | ICD-10-CM | POA: Diagnosis not present

## 2018-07-14 DIAGNOSIS — D649 Anemia, unspecified: Secondary | ICD-10-CM | POA: Diagnosis present

## 2018-07-14 DIAGNOSIS — R195 Other fecal abnormalities: Secondary | ICD-10-CM

## 2018-07-14 LAB — COMPREHENSIVE METABOLIC PANEL
ALT: 9 U/L (ref 0–44)
AST: 15 U/L (ref 15–41)
Albumin: 3.5 g/dL (ref 3.5–5.0)
Alkaline Phosphatase: 48 U/L (ref 38–126)
Anion gap: 9 (ref 5–15)
BUN: 24 mg/dL — ABNORMAL HIGH (ref 8–23)
CO2: 22 mmol/L (ref 22–32)
Calcium: 9.1 mg/dL (ref 8.9–10.3)
Chloride: 109 mmol/L (ref 98–111)
Creatinine, Ser: 1.53 mg/dL — ABNORMAL HIGH (ref 0.44–1.00)
GFR calc Af Amer: 38 mL/min — ABNORMAL LOW (ref >60)
GFR calc non Af Amer: 33 mL/min — ABNORMAL LOW (ref >60)
Glucose, Bld: 116 mg/dL — ABNORMAL HIGH (ref 70–99)
Potassium: 4.7 mmol/L (ref 3.5–5.1)
Sodium: 140 mmol/L (ref 135–145)
Total Bilirubin: 0.6 mg/dL (ref 0.3–1.2)
Total Protein: 6.9 g/dL (ref 6.5–8.1)

## 2018-07-14 LAB — RETICULOCYTES
Immature Retic Fract: 27.3 % — ABNORMAL HIGH (ref 2.3–15.9)
RBC.: 1.84 MIL/uL — ABNORMAL LOW (ref 3.87–5.11)
Retic Count, Absolute: 76 10*3/uL (ref 19.0–186.0)
Retic Ct Pct: 4.1 % — ABNORMAL HIGH (ref 0.4–3.1)

## 2018-07-14 LAB — IRON AND TIBC
Iron: 22 ug/dL — ABNORMAL LOW (ref 28–170)
Saturation Ratios: 5 % — ABNORMAL LOW (ref 10.4–31.8)
TIBC: 423 ug/dL (ref 250–450)
UIBC: 401 ug/dL

## 2018-07-14 LAB — CBC
HCT: 17.8 % — ABNORMAL LOW (ref 36.0–46.0)
Hemoglobin: 5.3 g/dL — CL (ref 12.0–15.0)
MCH: 29 pg (ref 26.0–34.0)
MCHC: 29.8 g/dL — ABNORMAL LOW (ref 30.0–36.0)
MCV: 97.3 fL (ref 80.0–100.0)
Platelets: 308 10*3/uL (ref 150–400)
RBC: 1.83 MIL/uL — ABNORMAL LOW (ref 3.87–5.11)
RDW: 13.8 % (ref 11.5–15.5)
WBC: 7.5 10*3/uL (ref 4.0–10.5)
nRBC: 0 % (ref 0.0–0.2)

## 2018-07-14 LAB — VITAMIN B12: Vitamin B-12: 207 pg/mL (ref 180–914)

## 2018-07-14 LAB — PREPARE RBC (CROSSMATCH)

## 2018-07-14 LAB — OCCULT BLOOD X 1 CARD TO LAB, STOOL: Fecal Occult Bld: POSITIVE — AB

## 2018-07-14 LAB — FERRITIN: Ferritin: 4 ng/mL — ABNORMAL LOW (ref 11–307)

## 2018-07-14 LAB — FOLATE: Folate: >22.4 ng/mL (ref >5.9)

## 2018-07-14 MED ORDER — AMLODIPINE-OLMESARTAN 5-40 MG PO TABS: 1.0000 | ORAL_TABLET | Freq: Every day | ORAL | Status: DC

## 2018-07-14 MED ORDER — ACETAMINOPHEN 650 MG RE SUPP: 650.0000 mg | Freq: Four times a day (QID) | RECTAL | Status: DC | PRN

## 2018-07-14 MED ORDER — SODIUM CHLORIDE 0.9 % IV SOLN: INTRAVENOUS | Status: DC | PRN

## 2018-07-14 MED ORDER — SODIUM CHLORIDE 0.9 % IV SOLN
80.0000 mg | Freq: Once | INTRAVENOUS | Status: AC
  Administered 2018-07-14: 80 mg via INTRAVENOUS
  Filled 2018-07-14: qty 80

## 2018-07-14 MED ORDER — ROSUVASTATIN CALCIUM 10 MG PO TABS
10.0000 mg | ORAL_TABLET | Freq: Every day | ORAL | Status: DC
  Administered 2018-07-14 – 2018-07-17 (×4): 10 mg via ORAL
  Filled 2018-07-14 (×4): qty 1

## 2018-07-14 MED ORDER — GABAPENTIN 100 MG PO CAPS
100.0000 mg | ORAL_CAPSULE | Freq: Three times a day (TID) | ORAL | Status: DC
  Administered 2018-07-14 – 2018-07-18 (×11): 100 mg via ORAL
  Filled 2018-07-14 (×11): qty 1

## 2018-07-14 MED ORDER — SPIRONOLACTONE 25 MG PO TABS: 25.0000 mg | ORAL_TABLET | Freq: Every day | ORAL | Status: DC

## 2018-07-14 MED ORDER — SODIUM CHLORIDE 0.9% IV SOLUTION: Freq: Once | INTRAVENOUS | Status: DC

## 2018-07-14 MED ORDER — PANTOPRAZOLE SODIUM 40 MG PO TBEC
40.0000 mg | DELAYED_RELEASE_TABLET | Freq: Every day | ORAL | Status: DC
  Administered 2018-07-14 – 2018-07-17 (×4): 40 mg via ORAL
  Filled 2018-07-14 (×5): qty 1

## 2018-07-14 MED ORDER — ACETAMINOPHEN 325 MG PO TABS
650.0000 mg | ORAL_TABLET | Freq: Four times a day (QID) | ORAL | Status: DC | PRN
  Administered 2018-07-14 – 2018-07-16 (×2): 650 mg via ORAL
  Filled 2018-07-14 (×2): qty 2

## 2018-07-14 NOTE — ED Notes (Signed)
Bed: MA00 Expected date:  Expected time:  Means of arrival:  Comments: hOLD FOR TRIAGE

## 2018-07-14 NOTE — ED Notes (Signed)
MD at bedside. 

## 2018-07-14 NOTE — ED Notes (Signed)
ED TO INPATIENT HANDOFF REPORT  Name/Age/Gender [name] 77 y.o. female  Code Status    Code Status Orders  (From admission, onward)         Start     Ordered   07/14/18 1436  Full code  Continuous     07/14/18 1436        Code Status History    Date Active Date Inactive Code Status Order ID Comments User Context   01/09/2018 0858 01/09/2018 1626 Full Code [id number]  Charolette Forward, MD Inpatient   09/15/2016 1706 09/17/2016 1516 Full Code [id number]  Albertine Patricia, MD Inpatient   09/15/2016 1547 09/15/2016 1706 DNR [id number]  Elgergawy, Silver Huguenin, MD ED   10/22/2015 2337 10/24/2015 2006 DNR [id number]  Toy Baker, MD Inpatient   10/22/2015 2024 10/22/2015 2337 Full Code [id number]  Toy Baker, MD Inpatient   08/09/2014 1107 08/10/2014 0339 Full Code [id number]  Deveshwar, Fritz Pickerel, MD HOV   08/03/2014 1842 08/04/2014 1503 Full Code [id number]  Dixie Dials, MD Inpatient   08/02/2014 2008 08/03/2014 1842 Full Code [id number]  Waldemar Dickens, MD Inpatient   08/02/2014 2008 08/02/2014 2008 Full Code [id number]  Waldemar Dickens, MD Inpatient   08/07/2013 2018 08/09/2013 1835 Full Code [id number]  Jessee Avers, MD Inpatient   04/01/2013 1516 04/06/2013 1545 Full Code [id number]  Geradine Girt, DO Inpatient    Advance Directive Documentation     Most Recent Value  Type of Advance Directive  Healthcare Power of Attorney, Living will  Pre-existing out of facility DNR order (yellow form or pink MOST form)  -  "MOST" Form in Place?  -      Home/SNF/Other Home  Chief Complaint ca pt/possible blood transfusion-sent by dr  Level of Care/Admitting Diagnosis ED Disposition    ED Disposition Condition [place]: [place] [[id number]]  Level of Care: Telemetry [5]  Admit to tele based on following criteria: Monitor for Ischemic changes  Diagnosis: GI bleed [062376]  Admitting Physician: [place], Vashon  Attending  Physician: Debbe Odea [[id number]]  Estimated length of stay: past midnight tomorrow  Certification:: I certify this patient will need inpatient services for at least 2 midnights  PT Class (Do Not Modify): Inpatient [101]  PT Acc Code (Do Not Modify): Private [1]       Medical History Past Medical History:  Diagnosis Date  . 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 Asthma   . 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  . Blood in stool    last week of aug 2018  . Carotid artery occlusion   . Complication of anesthesia   . COPD (chronic obstructive pulmonary disease) ([place])   . Coronary artery disease   . 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 Diverticulitis   . Dyspnea    with exertion  . Gastroesophageal reflux disease    occ  . Glaucoma    right eye  . Headache    tension  . Hiatal hernia   . Hyperlipidemia   . Hypertension   . Iliac artery aneurysm ([place])   . 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 Myocardial infarction [facility]) 01/01/2000   Cardiac catheterization  . Peripheral vascular disease ([place])    stents in legs x 2 or 3  . Pneumonia    last  time winter 2017 -2018  . PONV (postoperative nausea and vomiting)    occassionally, last colonscopy did ok with anesthesia  . Reflux     Allergies Allergies  Allergen Reactions  . Hydromorphone Palpitations and Other (See Comments)    DILAUDID  -  Pt had a Heart Attack after taking Dilaudid.  . Levaquin [Levofloxacin] Other (See Comments)    Chest pressure, SOB, "pain in between shoulder blades", sweaty -as reported by patient per experience in ED this afternoon  . Azithromycin Other (See Comments)    Patient reported past history of lip swelling  . Codeine Other (See Comments)    Dr. Terrence Dupont advised patient not to take this medication  . Doxycycline Swelling and Other (See Comments)    Mouth,  lips, feet swelling  . Oxycodone-Acetaminophen Other (See Comments)    Says it makes her feel weird  . Risedronate Other (See Comments)    Chest pain  . Avelox [Moxifloxacin Hcl In Nacl] Palpitations    IV Location/Drains/Wounds Patient Lines/Drains/Airways Status   Active Line/Drains/Airways    Name:   Placement date:   Placement time:   Site:   Days:   Peripheral IV 07/14/18 Left Antecubital   07/14/18    1327    Antecubital   less than 1   External Urinary Catheter   07/14/18    1445    -   less than 1          Labs/Imaging Results for orders placed or performed during the hospital encounter of 07/14/18 (from the past 48 hour(s))  Comprehensive metabolic panel     Status: Abnormal   Collection Time: 07/14/18  1:19 PM  Result Value Ref Range   Sodium 140 135 - 145 mmol/L   Potassium 4.7 3.5 - 5.1 mmol/L   Chloride 109 98 - 111 mmol/L   CO2 22 22 - 32 mmol/L   Glucose, Bld 116 (H) 70 - 99 mg/dL   BUN 24 (H) 8 - 23 mg/dL   Creatinine, Ser 1.53 (H) 0.44 - 1.00 mg/dL   Calcium 9.1 8.9 - 10.3 mg/dL   Total Protein 6.9 6.5 - 8.1 g/dL   Albumin 3.5 3.5 - 5.0 g/dL   AST 15 15 - 41 U/L   ALT 9 0 - 44 U/L   Alkaline Phosphatase 48 38 - 126 U/L   Total Bilirubin 0.6 0.3 - 1.2 mg/dL   GFR calc non Af Amer 33 (L) >60 mL/min   GFR calc Af Amer 38 (L) >60 mL/min   Anion gap 9 5 - 15    Comment: Performed at [facility], [place] [address] [place], [place] [address]  CBC     Status: Abnormal   Collection Time: 07/14/18  1:19 PM  Result Value Ref Range   WBC 7.5 4.0 - 10.5 K/uL   RBC 1.83 (L) 3.87 - 5.11 MIL/uL   Hemoglobin 5.3 (LL) 12.0 - 15.0 g/dL    Comment: This critical result has verified and been called to [place] by Sandi Mealy on 12 02 2019 at 1356, and has been read back. CRITICAL RESULT VERIFIED   HCT 17.8 (L) 36.0 - 46.0 %   MCV 97.3 80.0 - 100.0 fL   MCH 29.0 26.0 - 34.0 pg   MCHC 29.8 (L) 30.0 - 36.0 g/dL   RDW 13.8 11.5 - 15.5 %   Platelets  308 150 - 400 K/uL   nRBC 0.0 0.0 - 0.2 %    Comment:  Performed at [facility], [place] [address], [place], [place] [address]  Type and screen [place]     Status: None (Preliminary result)   Collection Time: 07/14/18  1:19 PM  Result Value Ref Range   ABO/RH(D) A POS    Antibody Screen NEG    Sample Expiration 07/17/2018    Unit Number [id number]    Blood Component Type RED CELLS,LR    Unit division 00    Status of Unit REL FROM [facility]    Transfusion Status OK TO TRANSFUSE    Crossmatch Result Compatible    Unit Number [id number]    Blood Component Type RED CELLS,LR    Unit division 00    Status of Unit REL FROM [facility]    Transfusion Status OK TO TRANSFUSE    Crossmatch Result Compatible    Unit Number [id number]    Blood Component Type RED CELLS,LR    Unit division 00    Status of Unit ISSUED    Transfusion Status OK TO TRANSFUSE    Crossmatch Result      Compatible Performed at [place] [address], [place], [place] [address]    Unit Number [id number]    Blood Component Type RED CELLS,LR    Unit division 00    Status of Unit ALLOCATED    Transfusion Status OK TO TRANSFUSE    Crossmatch Result Compatible   Occult blood card to lab, stool     Status: Abnormal   Collection Time: 07/14/18  1:20 PM  Result Value Ref Range   Fecal Occult Bld POSITIVE (A) NEGATIVE    Comment: Performed at [facility], [place] [address], [place], [place] [address]  Prepare RBC     Status: None   Collection Time: 07/14/18  2:01 PM  Result Value Ref Range   Order Confirmation      ORDER PROCESSED BY BLOOD BANK Performed at [place] [address], [place], [place] [address]    No results found. None  Pending Labs Unresulted Labs (From admission, onward)    Start     Ordered   07/15/18 0500  CBC  Tomorrow morning,   R     07/14/18 1436   07/15/18 0093  Basic metabolic  panel  Tomorrow morning,   R     07/14/18 1436   07/14/18 1600  Vitamin B12  (Anemia Panel (PNL))  Add-on,   R     07/14/18 1559   07/14/18 1600  Folate  (Anemia Panel (PNL))  Add-on,   R     07/14/18 1559   07/14/18 1600  Iron and TIBC  (Anemia Panel (PNL))  Once,   R     07/14/18 1559   07/14/18 1600  Ferritin  (Anemia Panel (PNL))  Once,   R     07/14/18 1559   07/14/18 1600  Reticulocytes  (Anemia Panel (PNL))  Once,   R     07/14/18 1559          Vitals/Pain Today's Vitals   07/14/18 1553 07/14/18 1555 07/14/18 1600 07/14/18 1614  BP: (!) 113/41 (!) 113/41 (!) 127/43 (!) 130/41  Pulse: 67 68 72 67  Resp: 18 15 18 18   Temp:  98.3 F (36.8 C)  98.4 F (36.9 C)  TempSrc:  Oral  Oral  SpO2: 99% 98% 100% 99%  Weight:      Height:      PainSc:  Isolation Precautions No active isolations  Medications Medications  0.9 %  sodium chloride infusion (has no administration in time range)  0.9 %  sodium chloride infusion (Manually program via Guardrails IV Fluids) (has no administration in time range)  acetaminophen (TYLENOL) tablet 650 mg (has no administration in time range)    Or  acetaminophen (TYLENOL) suppository 650 mg (has no administration in time range)  pantoprazole (PROTONIX) EC tablet 40 mg (has no administration in time range)  gabapentin (NEURONTIN) capsule 100 mg (has no administration in time range)  rosuvastatin (CRESTOR) tablet 10 mg (has no administration in time range)  pantoprazole (PROTONIX) 80 mg in sodium chloride 0.9 % 100 mL IVPB (0 mg Intravenous Stopped 07/14/18 1403)    Mobility walks

## 2018-07-14 NOTE — H&P (Signed)
History and Physical    [name]  XWR:[id number]  DOB: 1941/03/15  DOA: 07/14/2018 PCP: [name], MD   Patient coming from: home  Chief Complaint: fatigue, shortness of breath, lightheaded  HPI: [name] is a 77 y.o. female with medical history of GI bleed (no source), IDA, GERD, COPD, CAD, HTN, HLD, CKD 3 who has been feeling very weak, light headed and short of breath with exertion for a few weeks. She is found to have a Hb of 5. She has been receiving Iron transfusions for IDA. She follows with Dr Benson Norway for GI and had a capsule study this year showing AVMs which were not bleeding. Upper and lower GIs last year by Dr Fuller Plan were negative  The patient state she has had heart burn for years. Lately she has been losing weight due to poor oral intake and has lost about 10-15 lbs.  Last Iron transfusion this past summer. She see's Dr Julien Nordmann for this.   ED Course: Hb 5.3  Review of Systems:  Bruises easy. All other systems reviewed and apart from HPI, are negative.  Past Medical History:  Diagnosis Date  . 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 Asthma   . 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  . Blood in stool    last week of aug 2018  . Carotid artery occlusion   . Complication of anesthesia   . COPD (chronic obstructive pulmonary disease) ([place])   . Coronary artery disease   . 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 Diverticulitis   . Dyspnea    with exertion  . Gastroesophageal reflux disease    occ  . Glaucoma    right eye  . Headache    tension  . Hiatal hernia   . Hyperlipidemia   . Hypertension   . Iliac artery aneurysm ([place])   . 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 Myocardial infarction [facility]) 01/01/2000   Cardiac catheterization  . Peripheral vascular disease ([place])    stents in legs x 2 or 3  . Pneumonia    last  time winter 2017 -2018  . PONV (postoperative nausea and vomiting)    occassionally, last colonscopy did ok with anesthesia  . Reflux     Past Surgical History:  Procedure Laterality Date  . ABDOMINAL HYSTERECTOMY    . APPENDECTOMY    . Arnold-chiari malformation repair  1998   Suboccipital craniectomy  . CAROTID ENDARTERECTOMY  03/29/2010   Left  CEA  . CHOLECYSTECTOMY     Gall Bladder  . COLONOSCOPY WITH PROPOFOL N/A 04/22/2015   Procedure: COLONOSCOPY WITH PROPOFOL;  Surgeon: Carol Ada, MD;  Location: [facility];  Service: Endoscopy;  Laterality: N/A;  . COLONOSCOPY WITH PROPOFOL N/A 05/25/2016   Procedure: COLONOSCOPY WITH PROPOFOL;  Surgeon: Carol Ada, MD;  Location: [facility];  Service: Endoscopy;  Laterality: N/A;  . COLONOSCOPY WITH PROPOFOL N/A 05/03/2017   Procedure: COLONOSCOPY WITH PROPOFOL;  Surgeon: Carol Ada, MD;  Location: [facility];  Service: Endoscopy;  Laterality: N/A;  . CORNEAL TRANSPLANT     Right  . CORONARY ARTERY BYPASS GRAFT  01/01/2000   x 3  . ENTEROSCOPY N/A 02/27/2018   Procedure: ENTEROSCOPY;  Surgeon: Carol Ada, MD;  Location: [facility];  Service: Endoscopy;  Laterality: N/A;  . ESOPHAGOGASTRODUODENOSCOPY N/A 05/25/2016   Procedure: ESOPHAGOGASTRODUODENOSCOPY (EGD);  Surgeon: Carol Ada, MD;  Location: [facility];  Service: Endoscopy;  Laterality: N/A;  . EYE SURGERY Right 1995 or 1996   Laser surgery for retinal hemorrhage  . HOT HEMOSTASIS N/A 02/27/2018   Procedure: HOT HEMOSTASIS (ARGON PLASMA COAGULATION/BICAP);  Surgeon: Carol Ada, MD;  Location: [facility];  Service: Endoscopy;  Laterality: N/A;  . IR RADIOLOGIST EVAL & MGMT  12/14/2016  . LEFT HEART CATH AND CORS/GRAFTS ANGIOGRAPHY N/A 01/09/2018   Procedure: LEFT HEART CATH AND CORS/GRAFTS ANGIOGRAPHY;  Surgeon: Charolette Forward, MD;  Location: [facility];  Service: Cardiovascular;  Laterality: N/A;  . LEFT HEART CATHETERIZATION WITH CORONARY ANGIOGRAM N/A  08/03/2014   Procedure: LEFT HEART CATHETERIZATION WITH CORONARY ANGIOGRAM;  Surgeon: Birdie Riddle, MD;  Location: [facility] CATH [facility];  Service: Cardiovascular;  Laterality: N/A;  . Post Coronary Artery  BPG  01/05/2000   Right jugular sheath removed  . PR VEIN BYPASS GRAFT,AORTO-FEM-POP    . ROTATOR CUFF REPAIR     Right    Social History:   reports that she quit smoking about 17 years ago. Her smoking use included cigarettes. She has a 45.00 pack-year smoking history. She has never used smokeless tobacco. She reports that she does not drink alcohol or use drugs.  Allergies  Allergen Reactions  . Hydromorphone Palpitations and Other (See Comments)    DILAUDID  -  Pt had a Heart Attack after taking Dilaudid.  . Levaquin [Levofloxacin] Other (See Comments)    Chest pressure, SOB, "pain in between shoulder blades", sweaty -as reported by patient per experience in ED this afternoon  . Azithromycin Other (See Comments)    Patient reported past history of lip swelling  . Codeine Other (See Comments)    Dr. Terrence Dupont advised patient not to take this medication  . Doxycycline Swelling and Other (See Comments)    Mouth, lips, feet swelling  . Oxycodone-Acetaminophen Other (See Comments)    Says it makes her feel weird  . Risedronate Other (See Comments)    Chest pain  . Avelox [Moxifloxacin Hcl In Nacl] Palpitations    Family History  Problem Relation Age of Onset  . Heart disease Mother        Heart Disease before age 29  . Hypertension Mother   . Hyperlipidemia Mother   . Heart attack Mother   . Clotting disorder Mother   . Heart disease Father        Heart Disease before age 70  . Heart attack Father   . Hyperlipidemia Father   . Hypertension Father   . Heart disease Brother        Heart Disease before age 75  . Hyperlipidemia Brother   . Hypertension Brother   . Clotting disorder Brother   . AAA (abdominal aortic aneurysm) Brother   . Cerebral aneurysm Sister   . Hypertension  Sister   . AAA (abdominal aortic aneurysm) Sister   . Asthma Sister   . Cerebral aneurysm Brother   . Cancer Brother        Lung  . Hypertension Brother   . Heart attack Brother   . Heart disease Brother        Aneurysm of Brain  . Hypertension Brother   . Heart disease Brother   . Heart disease Brother   . Stroke Son        Aneurysm of Stomach  . AAA (abdominal aortic aneurysm) Son   . Cancer Maternal Uncle  great uncle/cancer/type unknown     Prior to Admission medications   Medication Sig Start Date End Date Taking? Authorizing Provider  acetaminophen (TYLENOL) 325 MG tablet Take 650 mg by mouth every 6 (six) hours as needed for moderate pain or headache.    Yes [provider]  amLODipine-olmesartan (AZOR) 5-40 MG tablet Take 1 tablet by mouth daily.   Yes [provider]  aspirin 81 MG chewable tablet Chew 81 mg by mouth daily.   Yes [provider]  clopidogrel (PLAVIX) 75 MG tablet Take 1 tablet (75 mg total) by mouth daily. 04/02/12  Yes Conrad Lake [place], MD  dexlansoprazole (DEXILANT) 60 MG capsule Take 1 capsule (60 mg total) by mouth daily before breakfast. 11/17/12  Yes Tanda Rockers, MD  gabapentin (NEURONTIN) 100 MG capsule Take 1 capsule (100 mg total) by mouth 3 (three) times daily. 06/23/18  Yes Noralee Space, MD  prednisoLONE acetate (PRED FORTE) 1 % ophthalmic suspension Place 1 drop into the right eye every other day.  02/26/14  Yes [provider]  rosuvastatin (CRESTOR) 10 MG tablet Take 10 mg by mouth at bedtime.    Yes [provider]  spironolactone (ALDACTONE) 25 MG tablet Take 25 mg by mouth daily. 03/25/17  Yes [provider]  albuterol (VENTOLIN HFA) 108 (90 BASE) MCG/ACT inhaler Inhale 2 puffs into the lungs every 4 (four) hours as needed for wheezing or shortness of breath (((PLAN B))). Patient not taking: Reported on 07/14/2018 08/09/13   Cater, Wynelle Bourgeois, MD  nitroGLYCERIN (NITROSTAT) 0.4 MG SL  tablet Place 0.4 mg under the tongue every 5 (five) minutes as needed for chest pain.     [provider]  umeclidinium-vilanterol (ANORO ELLIPTA) 62.5-25 MCG/INH AEPB Inhale 1 puff into the lungs daily. Patient not taking: Reported on 07/14/2018 05/13/17   Noralee Space, MD    Physical Exam: Wt Readings from Last 3 Encounters:  07/14/18 73.5 kg  06/23/18 71.9 kg  04/25/18 72.8 kg   Vitals:   07/14/18 1528 07/14/18 1529 07/14/18 1530 07/14/18 1555  BP: (!) 133/47 (!) 133/47 (!) 129/46 (!) 113/41  Pulse: 73 67 69 68  Resp: 15 20 17 15   Temp:  98.2 F (36.8 C)  98.3 F (36.8 C)  TempSrc:  Oral  Oral  SpO2: 99% 100% 100% 98%  Weight:      Height:          Constitutional:  Calm & comfortable Eyes: PERRLA, lids ,. conjunctivae pale ENT:  Mucous membranes are moist.  Pharynx clear of exudate   Normal dentition.  Neck: Supple, no masses  Respiratory:  Clear to auscultation bilaterally  Normal respiratory effort.  Cardiovascular:  S1 & S2 heard, regular rate and rhythm No Murmurs Abdomen:  Non distended No tenderness, No masses Bowel sounds normal Extremities:  No clubbing / cyanosis No pedal edema No joint deformity    Skin:  No rashes, lesions or ulcers Neurologic:  AAO x 3 CN 2-12 grossly intact Sensation intact Strength 5/5 in all 4 extremities Psychiatric:  Normal Mood and affect    Labs on Admission: I have personally reviewed following labs and imaging studies  CBC: Recent Labs  Lab 07/14/18 1319  WBC 7.5  HGB 5.3*  HCT 17.8*  MCV 97.3  PLT 676   Basic Metabolic Panel: Recent Labs  Lab 07/14/18 1319  NA 140  K 4.7  CL 109  CO2 22  GLUCOSE 116*  BUN 24*  CREATININE  1.53*  CALCIUM 9.1   GFR: Estimated Creatinine Clearance: 29.8 mL/min (A) (by C-G formula based on SCr of 1.53 mg/dL (H)). Liver Function Tests: Recent Labs  Lab 07/14/18 1319  AST 15  ALT 9  ALKPHOS 48  BILITOT 0.6  PROT 6.9  ALBUMIN 3.5   No results  for input(s): LIPASE, AMYLASE in the last 168 hours. No results for input(s): AMMONIA in the last 168 hours. Coagulation Profile: No results for input(s): INR, PROTIME in the last 168 hours. Cardiac Enzymes: No results for input(s): CKTOTAL, CKMB, CKMBINDEX, TROPONINI in the last 168 hours. BNP (last 3 results) No results for input(s): PROBNP in the last 8760 hours. HbA1C: No results for input(s): HGBA1C in the last 72 hours. CBG: No results for input(s): GLUCAP in the last 168 hours. Lipid Profile: No results for input(s): CHOL, HDL, LDLCALC, TRIG, CHOLHDL, LDLDIRECT in the last 72 hours. Thyroid Function Tests: No results for input(s): TSH, T4TOTAL, FREET4, T3FREE, THYROIDAB in the last 72 hours. Anemia Panel: No results for input(s): VITAMINB12, FOLATE, FERRITIN, TIBC, IRON, RETICCTPCT in the last 72 hours. Urine analysis:    Component Value Date/Time   COLORURINE YELLOW 02/24/2016 1905   APPEARANCEUR CLEAR 02/24/2016 1905   LABSPEC 1.019 02/24/2016 1905   PHURINE 5.5 02/24/2016 1905   GLUCOSEU NEGATIVE 02/24/2016 1905   HGBUR NEGATIVE 02/24/2016 1905   BILIRUBINUR NEGATIVE 02/24/2016 1905   KETONESUR NEGATIVE 02/24/2016 1905   PROTEINUR NEGATIVE 02/24/2016 1905   UROBILINOGEN 0.2 04/02/2013 0232   NITRITE NEGATIVE 02/24/2016 1905   LEUKOCYTESUR NEGATIVE 02/24/2016 1905   Sepsis Labs: @LABRCNTIP (procalcitonin:4,lacticidven:4) )No results found for this or any previous visit (from the past 240 hour(s)).   Radiological Exams on Admission: No results found.     Assessment/Plan Principal Problem:   Iron deficiency anemia/ symptomatic  Occult blood positive stool on ASA and Plavix - recurrent issue - hemoccult + - GI, Dr Benson Norway, called by ED - transfusing 2 U PRBC - hold ASA and Plavix - f/u on Dr Ulyses Amor eval  Active Problems: GERD - on Dexilant at home- cont PPI daily for now    COPD GOLD II - states she has a chronic cough- not on medication for this     Peripheral vascular disease /   Carotid artery stenosis   CAD (coronary artery disease) - hold ASA and Plavix   Essential hypertension - hold medications as BP slightly low  Stage IA non-small cell lung cancer, squamous cell carcinoma  - has had radiation follows with Dr Julien Nordmann  CKD 3 - close to baseline  DVT prophylaxis: SCDS Code Status: Full code  Family Communication:  daughter Disposition Plan: home   Consults called: GI, Dr Benson Norway called by ED  Admission status: inpatient    Debbe Odea MD [facility] Pager: [email or web address] Password [email or web address] 7PM-7AM, please contact night-coverage   07/14/2018, 3:56 PM

## 2018-07-14 NOTE — Plan of Care (Signed)
?  Problem: Elimination: ?Goal: Will not experience complications related to bowel motility ?Outcome: Progressing ?  ?Problem: Pain Managment: ?Goal: General experience of comfort will improve ?Outcome: Progressing ?  ?Problem: Safety: ?Goal: Ability to remain free from injury will improve ?Outcome: Progressing ?  ?

## 2018-07-14 NOTE — ED Notes (Signed)
Date and time results received: 07/14/18  (use smartphrase ".now" to insert current time)  Test: OCCULT POC Critical Value: POSITIVE  Name of Provider Notified: Mila Merry  Orders Received? Or Actions Taken?: Actions Taken: AWAITING RESPONSE

## 2018-07-14 NOTE — ED Provider Notes (Addendum)
[facility] DEPT Provider Note   CSN: [id number] Arrival date & time: 07/14/18  1204     History   Chief Complaint Chief Complaint  Patient presents with  . GI Bleeding    HPI DELINA KRUCZEK is a 77 y.o. female.  HPI   77 year old female with anemia.  Reports progressive generalized weakness, cold intolerance and exertional dyspnea.  Worsening over the past 4 to 5 weeks.  Mild upper abdominal discomfort.  Decreased appetite.  She went to see her PCP today noted to have a hemoglobin in the fives.  She reports that her stool has been dark brown recently but not melanotic.  She is on aspirin and Plavix.  Dr. Benson Norway is her gastroenterologist. Hx of Fe deficiency anemia.   Past Medical History:  Diagnosis Date  . 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 Asthma   . 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  . Blood in stool    last week of aug 2018  . Carotid artery occlusion   . Complication of anesthesia   . COPD (chronic obstructive pulmonary disease) ([place])   . Coronary artery disease   . 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 Diverticulitis   . Dyspnea    with exertion  . Gastroesophageal reflux disease    occ  . Glaucoma    right eye  . Headache    tension  . Hiatal hernia   . Hyperlipidemia   . Hypertension   . Iliac artery aneurysm ([place])   . 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 Myocardial infarction [facility]) 01/01/2000   Cardiac catheterization  . Peripheral vascular disease ([place])    stents in legs x 2 or 3  . Pneumonia    last time winter 2017 -2018  . PONV (postoperative nausea and vomiting)    occassionally, last colonscopy did ok with anesthesia  . Reflux     Patient Active Problem List   Diagnosis Date Noted  . History of shingles 06/23/2018  . Neuralgia and neuritis 06/23/2018  . Costochondritis,  acute 04/18/2018  . Hyperkalemia 02/06/2017  . Physical deconditioning 11/08/2016  . Acute lower GI bleeding   . BRBPR (bright red blood per rectum) 09/15/2016  . Iron deficiency anemia 06/07/2016  . Bronchitis, acute 06/07/2016  . 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 Primary cancer of right lower lobe of lung ([place]) 04/25/2016  . 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 Foot pain, right 10/24/2015  . CAP (community acquired pneumonia) 10/22/2015  . Prolonged QT interval 10/22/2015  . UTI (urinary tract infection) 10/22/2015  . COPD exacerbation ([place]) 10/22/2015  . Neck pain on right side 01/05/2015  . Chest pain 08/02/2014  . Intractable headache 08/02/2014  . HLD (hyperlipidemia) 08/02/2014  . Essential hypertension 08/02/2014  . GERD (gastroesophageal reflux disease) 08/02/2014  . Leg pain, right 08/02/2014  . HA (headache)   . Carotid artery stenosis 12/09/2013  . 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  . Dehydration 04/02/2013  . CAD (coronary artery disease) 04/02/2013  . AKI (acute kidney injury) ([place]) 04/01/2013  . Nausea & vomiting 04/01/2013  . Diarrhea 04/01/2013  . Hypokalemia 04/01/2013  . Hyponatremia 04/01/2013  . Peripheral vascular disease ([place]) 01/07/2013  . Occlusion and stenosis of carotid artery without mention of cerebral infarction 01/07/2013  . Dizziness and giddiness 01/07/2013  . Shortness of breath 01/07/2013  .  Cough 11/17/2012  . COPD GOLD II 11/17/2012    Past Surgical History:  Procedure Laterality Date  . ABDOMINAL HYSTERECTOMY    . APPENDECTOMY    . Arnold-chiari malformation repair  1998   Suboccipital craniectomy  . CAROTID ENDARTERECTOMY  03/29/2010   Left  CEA  . CHOLECYSTECTOMY     Gall Bladder  . COLONOSCOPY WITH PROPOFOL N/A 04/22/2015   Procedure: COLONOSCOPY WITH PROPOFOL;  Surgeon: Carol Ada, MD;  Location: [facility];  Service: Endoscopy;  Laterality: N/A;  . COLONOSCOPY WITH PROPOFOL N/A 05/25/2016   Procedure: COLONOSCOPY WITH  PROPOFOL;  Surgeon: Carol Ada, MD;  Location: [facility];  Service: Endoscopy;  Laterality: N/A;  . COLONOSCOPY WITH PROPOFOL N/A 05/03/2017   Procedure: COLONOSCOPY WITH PROPOFOL;  Surgeon: Carol Ada, MD;  Location: [facility];  Service: Endoscopy;  Laterality: N/A;  . CORNEAL TRANSPLANT     Right  . CORONARY ARTERY BYPASS GRAFT  01/01/2000   x 3  . ENTEROSCOPY N/A 02/27/2018   Procedure: ENTEROSCOPY;  Surgeon: Carol Ada, MD;  Location: [facility];  Service: Endoscopy;  Laterality: N/A;  . ESOPHAGOGASTRODUODENOSCOPY N/A 05/25/2016   Procedure: ESOPHAGOGASTRODUODENOSCOPY (EGD);  Surgeon: Carol Ada, MD;  Location: [facility];  Service: Endoscopy;  Laterality: N/A;  . EYE SURGERY Right 1995 or 1996   Laser surgery for retinal hemorrhage  . HOT HEMOSTASIS N/A 02/27/2018   Procedure: HOT HEMOSTASIS (ARGON PLASMA COAGULATION/BICAP);  Surgeon: Carol Ada, MD;  Location: [facility];  Service: Endoscopy;  Laterality: N/A;  . IR RADIOLOGIST EVAL & MGMT  12/14/2016  . LEFT HEART CATH AND CORS/GRAFTS ANGIOGRAPHY N/A 01/09/2018   Procedure: LEFT HEART CATH AND CORS/GRAFTS ANGIOGRAPHY;  Surgeon: Charolette Forward, MD;  Location: [facility];  Service: Cardiovascular;  Laterality: N/A;  . LEFT HEART CATHETERIZATION WITH CORONARY ANGIOGRAM N/A 08/03/2014   Procedure: LEFT HEART CATHETERIZATION WITH CORONARY ANGIOGRAM;  Surgeon: Birdie Riddle, MD;  Location: [facility] CATH LAB;  Service: Cardiovascular;  Laterality: N/A;  . Post Coronary Artery  BPG  01/05/2000   Right jugular sheath removed  . PR VEIN BYPASS GRAFT,AORTO-FEM-POP    . ROTATOR CUFF REPAIR     Right     OB History   None      Home Medications    Prior to Admission medications   Medication Sig Start Date End Date Taking? Authorizing Provider  acetaminophen (TYLENOL) 325 MG tablet Take 650 mg by mouth every 6 (six) hours as needed for moderate pain or headache.    Yes [provider]  amLODipine-olmesartan  (AZOR) 5-40 MG tablet Take 1 tablet by mouth daily.   Yes [provider]  aspirin 81 MG chewable tablet Chew 81 mg by mouth daily.   Yes [provider]  clopidogrel (PLAVIX) 75 MG tablet Take 1 tablet (75 mg total) by mouth daily. 04/02/12  Yes Conrad [place], MD  dexlansoprazole (DEXILANT) 60 MG capsule Take 1 capsule (60 mg total) by mouth daily before breakfast. 11/17/12  Yes Tanda Rockers, MD  gabapentin (NEURONTIN) 100 MG capsule Take 1 capsule (100 mg total) by mouth 3 (three) times daily. 06/23/18  Yes Noralee Space, MD  prednisoLONE acetate (PRED FORTE) 1 % ophthalmic suspension Place 1 drop into the right eye every other day.  02/26/14  Yes [provider]  rosuvastatin (CRESTOR) 10 MG tablet Take 10 mg by mouth at bedtime.    Yes [provider]  spironolactone (ALDACTONE) 25 MG tablet Take 25 mg by  mouth daily. 03/25/17  Yes [provider]  albuterol (VENTOLIN HFA) 108 (90 BASE) MCG/ACT inhaler Inhale 2 puffs into the lungs every 4 (four) hours as needed for wheezing or shortness of breath (((PLAN B))). Patient not taking: Reported on 07/14/2018 08/09/13   Cater, Wynelle Bourgeois, MD  nitroGLYCERIN (NITROSTAT) 0.4 MG SL tablet Place 0.4 mg under the tongue every 5 (five) minutes as needed for chest pain.     [provider]  umeclidinium-vilanterol (ANORO ELLIPTA) 62.5-25 MCG/INH AEPB Inhale 1 puff into the lungs daily. Patient not taking: Reported on 07/14/2018 05/13/17   Noralee Space, MD    Family History Family History  Problem Relation Age of Onset  . Heart disease Mother        Heart Disease before age 67  . Hypertension Mother   . Hyperlipidemia Mother   . Heart attack Mother   . Clotting disorder Mother   . Heart disease Father        Heart Disease before age 75  . Heart attack Father   . Hyperlipidemia Father   . Hypertension Father   . Heart disease Brother        Heart Disease before age 58  . Hyperlipidemia Brother   .  Hypertension Brother   . Clotting disorder Brother   . AAA (abdominal aortic aneurysm) Brother   . Cerebral aneurysm Sister   . Hypertension Sister   . AAA (abdominal aortic aneurysm) Sister   . Asthma Sister   . Cerebral aneurysm Brother   . Cancer Brother        Lung  . Hypertension Brother   . Heart attack Brother   . Heart disease Brother        Aneurysm of Brain  . Hypertension Brother   . Heart disease Brother   . Heart disease Brother   . Stroke Son        Aneurysm of Stomach  . AAA (abdominal aortic aneurysm) Son   . Cancer Maternal Uncle        great uncle/cancer/type unknown    Social History Social History   Tobacco Use  . Smoking status: Former Smoker    Packs/day: 1.50    Years: 30.00    Pack years: 45.00    Types: Cigarettes    Last attempt to quit: 08/13/2000    Years since quitting: 17.9  . Smokeless tobacco: Never Used  Substance Use Topics  . Alcohol use: No    Alcohol/week: 0.0 standard drinks  . Drug use: No     Allergies   Hydromorphone; Levaquin [levofloxacin]; Azithromycin; Codeine; Doxycycline; Oxycodone-acetaminophen; Risedronate; and Avelox [moxifloxacin hcl in nacl]   Review of Systems Review of Systems  All systems reviewed and negative, other than as noted in HPI.  Physical Exam Updated Vital Signs BP (!) 146/38 (BP Location: Left Arm)   Pulse 84   Temp 97.7 F (36.5 C) (Oral)   Resp 16   Ht 5\' 2"  (1.575 m)   Wt 73.5 kg   SpO2 98%   BMI 29.63 kg/m   Physical Exam  Constitutional: She appears well-developed and well-nourished. No distress.  HENT:  Head: Normocephalic and atraumatic.  Eyes: Conjunctivae are normal. Right eye exhibits no discharge. Left eye exhibits no discharge.  Pale conjunctiva  Neck: Neck supple.  Cardiovascular: Normal rate, regular rhythm and normal heart sounds. Exam reveals no gallop and no friction rub.  No murmur heard. Pulmonary/Chest: Effort normal and breath sounds normal. No respiratory  distress.  Abdominal: Soft. She exhibits no distension. There is no tenderness.  Musculoskeletal: She exhibits no edema or tenderness.  Neurological: She is alert.  Skin: Skin is warm and dry.  Psychiatric: She has a normal mood and affect. Her behavior is normal. Thought content normal.  Nursing note and vitals reviewed.    ED Treatments / Results  Labs (all labs ordered are listed, but only abnormal results are displayed) Labs Reviewed  COMPREHENSIVE METABOLIC PANEL - Abnormal; Notable for the following components:      Result Value   Glucose, Bld 116 (*)    BUN 24 (*)    Creatinine, Ser 1.53 (*)    GFR calc non Af Amer 33 (*)    GFR calc Af Amer 38 (*)    All other components within normal limits  CBC - Abnormal; Notable for the following components:   RBC 1.83 (*)    Hemoglobin 5.3 (*)    HCT 17.8 (*)    MCHC 29.8 (*)    All other components within normal limits  OCCULT BLOOD X 1 CARD TO LAB, STOOL - Abnormal; Notable for the following components:   Fecal Occult Bld POSITIVE (*)    All other components within normal limits  VITAMIN B12  FOLATE  IRON AND TIBC  FERRITIN  RETICULOCYTES  POC OCCULT BLOOD, ED  TYPE AND SCREEN  PREPARE RBC (CROSSMATCH)    EKG None  Radiology No results found.  Procedures Procedures (including critical care time)  CRITICAL CARE Performed by: Virgel Manifold Total critical care time:  40 minutes Critical care time was exclusive of separately billable procedures and treating other patients. Critical care was necessary to treat or prevent imminent or life-threatening deterioration. Critical care was time spent personally by me on the following activities: development of treatment plan with patient and/or surrogate as well as nursing, discussions with consultants, evaluation of patient's response to treatment, examination of patient, obtaining history from patient or surrogate, ordering and performing treatments and interventions,  ordering and review of laboratory studies, ordering and review of radiographic studies, pulse oximetry and re-evaluation of patient's condition.   Medications Ordered in ED Medications  pantoprazole (PROTONIX) 80 mg in sodium chloride 0.9 % 100 mL IVPB (has no administration in time range)     Initial Impression / Assessment and Plan / ED Course  I have reviewed the triage vital signs and the nursing notes.  Pertinent labs & imaging results that were available during my care of the patient were reviewed by me and considered in my medical decision making (see chart for details).     77 year old female with symptomatic anemia.  Chronically anemic.  Approximately 10 weeks ago when her hemoglobin was 9.2.  Significant drop to 5.6 as tested earlier today. Will transfuse.  She reports some vague upper abdominal discomfort.  No tenderness on exam.  Will order Protonix.  Check Hemoccult.  Admission.  Previous colonoscopy and EGD by Dr Benson Norway normal. 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  Final Clinical Impressions(s) / ED Diagnoses   Final diagnoses:  Symptomatic anemia    ED Discharge Orders    None       Virgel Manifold, MD 07/14/18 1559    Virgel Manifold, MD 07/14/18 [id number]

## 2018-07-14 NOTE — ED Notes (Signed)
Date and time results received: 07/14/18  (use smartphrase ".now" to insert current time)  Test: HGB  Critical Value: 5.3  Name of Provider Notified: Mila Merry  Orders Received? Or Actions Taken?: Actions Taken: AWAITING RESPONSE

## 2018-07-14 NOTE — ED Triage Notes (Signed)
Pt complaint of upset stomach, fatigue, and lightheadedness. Was at primary care and blood levels noted to be 5. Concern for GI bleed.

## 2018-07-14 NOTE — ED Notes (Signed)
RN AWARE OF TWO UNITS. CURRENTLY FIRST UNIT TRANSFUSING

## 2018-07-15 DIAGNOSIS — I10 Essential (primary) hypertension: Secondary | ICD-10-CM | POA: Diagnosis not present

## 2018-07-15 DIAGNOSIS — K922 Gastrointestinal hemorrhage, unspecified: Secondary | ICD-10-CM | POA: Diagnosis not present

## 2018-07-15 DIAGNOSIS — D500 Iron deficiency anemia secondary to blood loss (chronic): Secondary | ICD-10-CM | POA: Diagnosis not present

## 2018-07-15 DIAGNOSIS — R195 Other fecal abnormalities: Secondary | ICD-10-CM | POA: Diagnosis not present

## 2018-07-15 LAB — TYPE AND SCREEN
ABO/RH(D): A POS
Antibody Screen: NEGATIVE
Unit division: 0
Unit division: 0
Unit division: 0
Unit division: 0

## 2018-07-15 LAB — CBC
HCT: 26 % — ABNORMAL LOW (ref 36.0–46.0)
Hemoglobin: 8.2 g/dL — ABNORMAL LOW (ref 12.0–15.0)
MCH: 28.9 pg (ref 26.0–34.0)
MCHC: 31.5 g/dL (ref 30.0–36.0)
MCV: 91.5 fL (ref 80.0–100.0)
Platelets: 291 10*3/uL (ref 150–400)
RBC: 2.84 MIL/uL — ABNORMAL LOW (ref 3.87–5.11)
RDW: 14.6 % (ref 11.5–15.5)
WBC: 7 10*3/uL (ref 4.0–10.5)
nRBC: 0 % (ref 0.0–0.2)

## 2018-07-15 LAB — BPAM RBC
BLOOD PRODUCT EXPIRATION DATE: 201912272359
Blood Product Expiration Date: 201912242359
Blood Product Expiration Date: 201912242359
Blood Product Expiration Date: 201912272359
ISSUE DATE / TIME: 201912022111
ISSUE DATE / TIME: 201912030353
ISSUE DATE / TIME: 201912021533
ISSUE DATE / TIME: 201912021538
UNIT TYPE AND RH: 6200
Unit Type and Rh: 5100
Unit Type and Rh: 5100
Unit Type and Rh: 6200

## 2018-07-15 LAB — BASIC METABOLIC PANEL
Anion gap: 7 (ref 5–15)
BUN: 25 mg/dL — ABNORMAL HIGH (ref 8–23)
CO2: 25 mmol/L (ref 22–32)
Calcium: 8.7 mg/dL — ABNORMAL LOW (ref 8.9–10.3)
Chloride: 109 mmol/L (ref 98–111)
Creatinine, Ser: 1.56 mg/dL — ABNORMAL HIGH (ref 0.44–1.00)
GFR calc Af Amer: 37 mL/min — ABNORMAL LOW (ref >60)
GFR calc non Af Amer: 32 mL/min — ABNORMAL LOW (ref >60)
Glucose, Bld: 101 mg/dL — ABNORMAL HIGH (ref 70–99)
Potassium: 4.8 mmol/L (ref 3.5–5.1)
Sodium: 141 mmol/L (ref 135–145)

## 2018-07-15 NOTE — Care Management CC44 (Signed)
Condition Code 44 Documentation Completed  Patient Details  Name: SHAKALA MARLATT MRN: [id number] Date of Birth: [birth year]   Condition Code 44 given:  Yes Patient signature on Condition Code 44 notice:  Yes Documentation of 2 MD's agreement:  Yes Code 44 added to claim:  Yes    Leeroy Cha, RN 07/15/2018, 3:55 PM

## 2018-07-15 NOTE — Progress Notes (Signed)
PROGRESS NOTE    [name]  UUV:[id number] DOB: 11/09/1940 DOA: 07/14/2018 PCP: [name], MD   Brief Narrative: [name] is a 78 y.o. female with a history of GI bleed, GERD, COPD, CAD, hypertension, hyperlipidemia, iron deficiency anemia, CKD stage III.  Patient presented with fatigue, shortness of breath, lightheadedness and found to have severe anemia.  Patient with a hemoglobin of 5.2 on admission and was given 2 units of PRBC with improvement of hemoglobin to 8.2.  Gastroenterology has been consulted and awaiting recommendations.   Assessment & Plan:   Principal Problem:   Iron deficiency anemia Active Problems:   COPD GOLD II   Peripheral vascular disease (HCC)   CAD (coronary artery disease)   Carotid artery stenosis   Essential hypertension   GERD (gastroesophageal reflux disease)   Occult blood positive stool   Iron deficiency anemia Severe symptomatic anemia Recurring issue. Patient has had multiple endoscopies and treatments. Stool hemoccult positive. Responded to 2 units of PRBC. Anticipate GI will perform endoscopic evaluation for recurrent bleeding -Serial CBCs -GI consult pending  GERD -Continue Protonix  COPD GOLD II Stable.   DVT prophylaxis: SCDs Code Status:   Code Status: Full Code Family Communication: None at bedside Disposition Plan: Discharge likely in 24-48 hours pending GI recommendations and management   Consultants:   Gastroenterology; Dr. Benson Norway  Procedures:   12/2: 2 units of PRBC  Antimicrobials:  None   Subjective: Patient reports feeling much better than yesterday.  She has not noticed any melena or hematochezia.  Objective: Vitals:   07/14/18 2135 07/15/18 0045 07/15/18 0740 07/15/18 1452  BP: (!) 120/44 (!) 117/47 (!) 141/49 (!) 129/43  Pulse: (!) 59 (!) 58 (!) 58 63  Resp: 18 20 18 18   Temp: 98.6 F (37 C) 98.3 F (36.8 C) 98.2 F (36.8 C) 98.9 F (37.2 C)  TempSrc: Oral Oral Oral Oral  SpO2: 94%  97% 100% 94%  Weight:      Height:        Intake/Output Summary (Last 24 hours) at 07/15/2018 1538 Last data filed at 07/15/2018 1429 Gross per 24 hour  Intake 1061.1 ml  Output 2200 ml  Net -1138.9 ml   Filed Weights   07/14/18 1238  Weight: 73.5 kg    Examination:  General exam: Appears calm and comfortable Respiratory system: Clear to auscultation. Respiratory effort normal. Cardiovascular system: S1 & S2 heard, RRR. No murmurs, rubs, gallops or clicks. Gastrointestinal system: Abdomen is nondistended, soft and nontender. No organomegaly or masses felt. Normal bowel sounds heard. Central nervous system: Alert and oriented. No focal neurological deficits. Extremities: No edema. No calf tenderness Skin: No cyanosis. No rashes Psychiatry: Judgement and insight appear normal. Mood & affect appropriate.     Data Reviewed: I have personally reviewed following labs and imaging studies  CBC: Recent Labs  Lab 07/14/18 1319 07/15/18 0411  WBC 7.5 7.0  HGB 5.3* 8.2*  HCT 17.8* 26.0*  MCV 97.3 91.5  PLT 308 474   Basic Metabolic Panel: Recent Labs  Lab 07/14/18 1319 07/15/18 0411  NA 140 141  K 4.7 4.8  CL 109 109  CO2 22 25  GLUCOSE 116* 101*  BUN 24* 25*  CREATININE 1.53* 1.56*  CALCIUM 9.1 8.7*   GFR: Estimated Creatinine Clearance: 29.3 mL/min (A) (by C-G formula based on SCr of 1.56 mg/dL (H)). Liver Function Tests: Recent Labs  Lab 07/14/18 1319  AST 15  ALT 9  ALKPHOS 48  BILITOT  0.6  PROT 6.9  ALBUMIN 3.5   No results for input(s): LIPASE, AMYLASE in the last 168 hours. No results for input(s): AMMONIA in the last 168 hours. Coagulation Profile: No results for input(s): INR, PROTIME in the last 168 hours. Cardiac Enzymes: No results for input(s): CKTOTAL, CKMB, CKMBINDEX, TROPONINI in the last 168 hours. BNP (last 3 results) No results for input(s): PROBNP in the last 8760 hours. HbA1C: No results for input(s): HGBA1C in the last 72  hours. CBG: No results for input(s): GLUCAP in the last 168 hours. Lipid Profile: No results for input(s): CHOL, HDL, LDLCALC, TRIG, CHOLHDL, LDLDIRECT in the last 72 hours. Thyroid Function Tests: No results for input(s): TSH, T4TOTAL, FREET4, T3FREE, THYROIDAB in the last 72 hours. Anemia Panel: Recent Labs    07/14/18 1705  VITAMINB12 207  FOLATE >22.4  FERRITIN 4*  TIBC 423  IRON 22*  RETICCTPCT 4.1*   Sepsis Labs: No results for input(s): PROCALCITON, LATICACIDVEN in the last 168 hours.  No results found for this or any previous visit (from the past 240 hour(s)).     Radiology Studies: No results found.      Scheduled Meds: . sodium chloride   Intravenous Once  . gabapentin  100 mg Oral TID  . pantoprazole  40 mg Oral Daily  . rosuvastatin  10 mg Oral QHS   Continuous Infusions: . sodium chloride       LOS: 1 day     Cordelia Poche, MD [facility] 07/15/2018, 3:38 PM  If 7PM-7AM, please contact night-coverage [email or web address]

## 2018-07-15 NOTE — Care Management Obs Status (Signed)
[facility] NOTIFICATION   Patient Details  Name: JANIQUE HOEFER MRN: [id number] Date of Birth: 08-15-1940   Medicare Observation Status Notification Given:       Leeroy Cha, RN 07/15/2018, 3:49 PM

## 2018-07-15 NOTE — Consult Note (Signed)
Reason for Consult: IDA and heme positive stool Referring Physician: [facility]  [name] HPI: This is a 77 year old female who is well-known to me for recurrent IDA and heme positive stool.  She had undergone work up in the past and most recently her enteroscopy 02/2018 was positive for a couple of nonbleeding proximal small bowel AVMs.  Before Thanksgiving she noticed that she was having fatigue and DOE.  She ultimately presented to her PCP and she was found to have an HGB in the 5 range.  This triggered the admission.  There is a history of dark stools, but she does not report any overt melena or hematochezia  Past Medical History:  Diagnosis Date  . 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 Asthma   . 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  . Blood in stool    last week of aug 2018  . Carotid artery occlusion   . Complication of anesthesia   . COPD (chronic obstructive pulmonary disease) ([place])   . Coronary artery disease   . 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 Diverticulitis   . Dyspnea    with exertion  . Gastroesophageal reflux disease    occ  . Glaucoma    right eye  . Headache    tension  . Hiatal hernia   . Hyperlipidemia   . Hypertension   . Iliac artery aneurysm ([place])   . 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 Myocardial infarction [facility]) 01/01/2000   Cardiac catheterization  . Peripheral vascular disease ([place])    stents in legs x 2 or 3  . Pneumonia    last time winter 2017 -2018  . PONV (postoperative nausea and vomiting)    occassionally, last colonscopy did ok with anesthesia  . Reflux     Past Surgical History:  Procedure Laterality Date  . ABDOMINAL HYSTERECTOMY    . APPENDECTOMY    . Arnold-chiari malformation repair  1998   Suboccipital craniectomy  . CAROTID ENDARTERECTOMY  03/29/2010   Left  CEA  .  CHOLECYSTECTOMY     Gall Bladder  . COLONOSCOPY WITH PROPOFOL N/A 04/22/2015   Procedure: COLONOSCOPY WITH PROPOFOL;  Surgeon: Carol Ada, MD;  Location: [facility] ENDOSCOPY;  Service: Endoscopy;  Laterality: N/A;  . COLONOSCOPY WITH PROPOFOL N/A 05/25/2016   Procedure: COLONOSCOPY WITH PROPOFOL;  Surgeon: Carol Ada, MD;  Location: [facility] ENDOSCOPY;  Service: Endoscopy;  Laterality: N/A;  . COLONOSCOPY WITH PROPOFOL N/A 05/03/2017   Procedure: COLONOSCOPY WITH PROPOFOL;  Surgeon: Carol Ada, MD;  Location: [facility] ENDOSCOPY;  Service: Endoscopy;  Laterality: N/A;  . CORNEAL TRANSPLANT     Right  . CORONARY ARTERY BYPASS GRAFT  01/01/2000   x 3  . ENTEROSCOPY N/A 02/27/2018   Procedure: ENTEROSCOPY;  Surgeon: Carol Ada, MD;  Location: [facility]OS[facility];  Service: Endoscopy;  Laterality: N/A;  . ESOPHAGOGASTRODUODENOSCOPY N/A 05/25/2016   Procedure: ESOPHAGOGASTRODUODENOSCOPY (EGD);  Surgeon: Carol Ada, MD;  Location: [facility];  Service: Endoscopy;  Laterality: N/A;  . EYE SURGERY Right 1995 or 1996   Laser surgery for retinal hemorrhage  . HOT HEMOSTASIS N/A 02/27/2018   Procedure: HOT HEMOSTASIS (ARGON PLASMA COAGULATION/BICAP);  Surgeon: Carol Ada, MD;  Location: [facility];  Service: Endoscopy;  Laterality: N/A;  . IR RADIOLOGIST EVAL & MGMT  12/14/2016  . LEFT HEART CATH AND CORS/GRAFTS  ANGIOGRAPHY N/A 01/09/2018   Procedure: LEFT HEART CATH AND CORS/GRAFTS ANGIOGRAPHY;  Surgeon: Charolette Forward, MD;  Location: [facility] CV LAB;  Service: Cardiovascular;  Laterality: N/A;  . LEFT HEART CATHETERIZATION WITH CORONARY ANGIOGRAM N/A 08/03/2014   Procedure: LEFT HEART CATHETERIZATION WITH CORONARY ANGIOGRAM;  Surgeon: Birdie Riddle, MD;  Location: [facility] CATH LAB;  Service: Cardiovascular;  Laterality: N/A;  . Post Coronary Artery  BPG  01/05/2000   Right jugular sheath removed  . PR VEIN BYPASS GRAFT,AORTO-FEM-POP    . ROTATOR CUFF REPAIR     Right    Family History  Problem Relation Age of  Onset  . Heart disease Mother        Heart Disease before age 62  . Hypertension Mother   . Hyperlipidemia Mother   . Heart attack Mother   . Clotting disorder Mother   . Heart disease Father        Heart Disease before age 50  . Heart attack Father   . Hyperlipidemia Father   . Hypertension Father   . Heart disease Brother        Heart Disease before age 21  . Hyperlipidemia Brother   . Hypertension Brother   . Clotting disorder Brother   . AAA (abdominal aortic aneurysm) Brother   . Cerebral aneurysm Sister   . Hypertension Sister   . AAA (abdominal aortic aneurysm) Sister   . Asthma Sister   . Cerebral aneurysm Brother   . Cancer Brother        Lung  . Hypertension Brother   . Heart attack Brother   . Heart disease Brother        Aneurysm of Brain  . Hypertension Brother   . Heart disease Brother   . Heart disease Brother   . Stroke Son        Aneurysm of Stomach  . AAA (abdominal aortic aneurysm) Son   . Cancer Maternal Uncle        great uncle/cancer/type unknown    Social History:  reports that she quit smoking about 17 years ago. Her smoking use included cigarettes. She has a 45.00 pack-year smoking history. She has never used smokeless tobacco. She reports that she does not drink alcohol or use drugs.  Allergies:  Allergies  Allergen Reactions  . Hydromorphone Palpitations and Other (See Comments)    DILAUDID  -  Pt had a Heart Attack after taking Dilaudid.  . Levaquin [Levofloxacin] Other (See Comments)    Chest pressure, SOB, "pain in between shoulder blades", sweaty -as reported by patient per experience in ED this afternoon  . Azithromycin Other (See Comments)    Patient reported past history of lip swelling  . Codeine Other (See Comments)    Dr. Terrence Dupont advised patient not to take this medication  . Doxycycline Swelling and Other (See Comments)    Mouth, lips, feet swelling  . Oxycodone-Acetaminophen Other (See Comments)    Says it makes her feel  weird  . Risedronate Other (See Comments)    Chest pain  . Avelox [Moxifloxacin Hcl In Nacl] Palpitations    Medications:  Scheduled: . sodium chloride   Intravenous Once  . gabapentin  100 mg Oral TID  . pantoprazole  40 mg Oral Daily  . rosuvastatin  10 mg Oral QHS   Continuous: . sodium chloride      Results for orders placed or performed during the hospital encounter of 07/14/18 (from the past 24 hour(s))  CBC  Status: Abnormal   Collection Time: 07/15/18  4:11 AM  Result Value Ref Range   WBC 7.0 4.0 - 10.5 K/uL   RBC 2.84 (L) 3.87 - 5.11 MIL/uL   Hemoglobin 8.2 (L) 12.0 - 15.0 g/dL   HCT 26.0 (L) 36.0 - 46.0 %   MCV 91.5 80.0 - 100.0 fL   MCH 28.9 26.0 - 34.0 pg   MCHC 31.5 30.0 - 36.0 g/dL   RDW 14.6 11.5 - 15.5 %   Platelets 291 150 - 400 K/uL   nRBC 0.0 0.0 - 0.2 %  Basic metabolic panel     Status: Abnormal   Collection Time: 07/15/18  4:11 AM  Result Value Ref Range   Sodium 141 135 - 145 mmol/L   Potassium 4.8 3.5 - 5.1 mmol/L   Chloride 109 98 - 111 mmol/L   CO2 25 22 - 32 mmol/L   Glucose, Bld 101 (H) 70 - 99 mg/dL   BUN 25 (H) 8 - 23 mg/dL   Creatinine, Ser 1.56 (H) 0.44 - 1.00 mg/dL   Calcium 8.7 (L) 8.9 - 10.3 mg/dL   GFR calc non Af Amer 32 (L) >60 mL/min   GFR calc Af Amer 37 (L) >60 mL/min   Anion gap 7 5 - 15     No results found.  ROS:  As stated above in the HPI otherwise negative.  Blood pressure (!) 129/43, pulse 63, temperature 98.9 F (37.2 C), temperature source Oral, resp. rate 18, height 5\' 2"  (1.575 m), weight 73.5 kg, SpO2 94 %.    PE: Gen: NAD, Alert and Oriented HEENT:  [place]/AT, EOMI Neck: Supple, no LAD Lungs: CTA Bilaterally CV: RRR without M/G/R ABM: Soft, NTND, +BS Ext: No C/C/E  Assessment/Plan: 1) IDA. 2) Heme positive stool. 3) Right lung SCC.   She is stable and she responded well to the blood transfusions.  The colonoscopy on 04/2017 was normal and it was performed for complaints of hematochezia.  The  enteroscopy did not show any actively bleeding sites.  The best course of action is to perform a capsule endoscopy.  This was discussed in the past, but it was never completed.  Plan: 1) Capsule endoscopy. 2) Follow HGB and transfuse as necessary.  [name] D 07/15/2018, 6:17 PM

## 2018-07-16 ENCOUNTER — Encounter (HOSPITAL_COMMUNITY)
Admission: EM | Disposition: A | Discharge status: Home / Self Care | Payer: Self-pay | Source: Home / Self Care | Attending: Internal Medicine

## 2018-07-16 DIAGNOSIS — D500 Iron deficiency anemia secondary to blood loss (chronic): Secondary | ICD-10-CM | POA: Diagnosis not present

## 2018-07-16 HISTORY — PX: GIVENS CAPSULE STUDY: SHX5432

## 2018-07-16 LAB — BASIC METABOLIC PANEL
Anion gap: 7 (ref 5–15)
BUN: 21 mg/dL (ref 8–23)
CO2: 25 mmol/L (ref 22–32)
Calcium: 8.9 mg/dL (ref 8.9–10.3)
Chloride: 107 mmol/L (ref 98–111)
Creatinine, Ser: 1.33 mg/dL — ABNORMAL HIGH (ref 0.44–1.00)
GFR calc Af Amer: 45 mL/min — ABNORMAL LOW (ref >60)
GFR calc non Af Amer: 39 mL/min — ABNORMAL LOW (ref >60)
Glucose, Bld: 124 mg/dL — ABNORMAL HIGH (ref 70–99)
Potassium: 4.2 mmol/L (ref 3.5–5.1)
Sodium: 139 mmol/L (ref 135–145)

## 2018-07-16 LAB — CBC
HCT: 28.5 % — ABNORMAL LOW (ref 36.0–46.0)
Hemoglobin: 8.7 g/dL — ABNORMAL LOW (ref 12.0–15.0)
MCH: 28.8 pg (ref 26.0–34.0)
MCHC: 30.5 g/dL (ref 30.0–36.0)
MCV: 94.4 fL (ref 80.0–100.0)
Platelets: 306 10*3/uL (ref 150–400)
RBC: 3.02 MIL/uL — ABNORMAL LOW (ref 3.87–5.11)
RDW: 14.2 % (ref 11.5–15.5)
WBC: 6.6 10*3/uL (ref 4.0–10.5)
nRBC: 0 % (ref 0.0–0.2)

## 2018-07-16 LAB — MAGNESIUM: Magnesium: 1.9 mg/dL (ref 1.7–2.4)

## 2018-07-16 SURGERY — IMAGING PROCEDURE, GI TRACT, INTRALUMINAL, VIA CAPSULE
Anesthesia: LOCAL

## 2018-07-16 SURGICAL SUPPLY — 1 items: TOWEL COTTON PACK 4EA (MISCELLANEOUS) ×4 IMPLANT

## 2018-07-16 NOTE — Progress Notes (Signed)
PROGRESS NOTE    [name]  UMP:[id number] DOB: 03/30/1941 DOA: 07/14/2018 PCP: [name], MD   Brief Narrative:  [name] is a 77 y.o. female with a history of GI bleed, GERD, COPD, CAD, hypertension, hyperlipidemia, iron deficiency anemia, CKD stage III.  Patient presented with fatigue, shortness of breath, lightheadedness and found to have severe anemia.  Patient with a hemoglobin of 5.2 on admission and was given 2 units of PRBC with improvement of hemoglobin to 8.2.  Gastroenterology has been consulted and awaiting recommendations.  Assessment & Plan:   Principal Problem:   Iron deficiency anemia Active Problems:   COPD GOLD II   Peripheral vascular disease (HCC)   CAD (coronary artery disease)   Carotid artery stenosis   Essential hypertension   GERD (gastroesophageal reflux disease)   Occult blood positive stool   GI bleed   Iron deficiency anemia Severe symptomatic anemia Recurring issue. Patient has had multiple endoscopies and treatments. Stool hemoccult positive. Responded to 2 units of PRBC.  -seen by Dr. Benson Norway who recommended capsule endoscopy, which is being performed today -continue PO PPI daily -will need iron at discharge -H/H from today pending  GERD -Continue Protonix  COPD GOLD II Stable.  CKD Stage III  Creatinine around baseline, continue to follow (baseline appears to be ~1.6)  DVT prophylaxis: SCD Code Status: full  Family Communication: none at bedside Disposition Plan: pending completion of GI workup, stable H/H   Consultants:   Gastroenterology  Procedures:   Capsule endoscopy  Antimicrobials:  Anti-infectives (From admission, onward)   None     Subjective: No complaints today. No BM since presentation.  Objective: Vitals:   07/15/18 2134 07/16/18 0448 07/16/18 0909 07/16/18 1411  BP: (!) 115/41 (!) 117/50  (!) 127/57  Pulse: (!) 54 (!) 54  (!) 59  Resp: 17 18  16   Temp: 99.9 F (37.7 C) 98 F (36.7 C)   98.1 F (36.7 C)  TempSrc: Oral Oral  Oral  SpO2: 95% 98%  96%  Weight:   73.5 kg   Height:   5\' 2"  (1.575 m)     Intake/Output Summary (Last 24 hours) at 07/16/2018 1416 Last data filed at 07/16/2018 1322 Gross per 24 hour  Intake 720 ml  Output 2525 ml  Net -1805 ml   Filed Weights   07/14/18 1238 07/16/18 0909  Weight: 73.5 kg 73.5 kg    Examination:  General exam: Appears calm and comfortable  Respiratory system: Clear to auscultation. Respiratory effort normal. Cardiovascular system: S1 & S2 heard, RRR Gastrointestinal system: Abdomen is nondistended, soft and nontender Central nervous system: Alert and oriented. No focal neurological deficits. Extremities: no LEE Skin: No rashes, lesions or ulcers Psychiatry: Judgement and insight appear normal. Mood & affect appropriate.     Data Reviewed: I have personally reviewed following labs and imaging studies  CBC: Recent Labs  Lab 07/14/18 1319 07/15/18 0411  WBC 7.5 7.0  HGB 5.3* 8.2*  HCT 17.8* 26.0*  MCV 97.3 91.5  PLT 308 400   Basic Metabolic Panel: Recent Labs  Lab 07/14/18 1319 07/15/18 0411  NA 140 141  K 4.7 4.8  CL 109 109  CO2 22 25  GLUCOSE 116* 101*  BUN 24* 25*  CREATININE 1.53* 1.56*  CALCIUM 9.1 8.7*   GFR: Estimated Creatinine Clearance: 29.3 mL/min (A) (by C-G formula based on SCr of 1.56 mg/dL (H)). Liver Function Tests: Recent Labs  Lab 07/14/18 1319  AST 15  ALT 9  ALKPHOS 48  BILITOT 0.6  PROT 6.9  ALBUMIN 3.5   No results for input(s): LIPASE, AMYLASE in the last 168 hours. No results for input(s): AMMONIA in the last 168 hours. Coagulation Profile: No results for input(s): INR, PROTIME in the last 168 hours. Cardiac Enzymes: No results for input(s): CKTOTAL, CKMB, CKMBINDEX, TROPONINI in the last 168 hours. BNP (last 3 results) No results for input(s): PROBNP in the last 8760 hours. HbA1C: No results for input(s): HGBA1C in the last 72 hours. CBG: No results for  input(s): GLUCAP in the last 168 hours. Lipid Profile: No results for input(s): CHOL, HDL, LDLCALC, TRIG, CHOLHDL, LDLDIRECT in the last 72 hours. Thyroid Function Tests: No results for input(s): TSH, T4TOTAL, FREET4, T3FREE, THYROIDAB in the last 72 hours. Anemia Panel: Recent Labs    07/14/18 1705  VITAMINB12 207  FOLATE >22.4  FERRITIN 4*  TIBC 423  IRON 22*  RETICCTPCT 4.1*   Sepsis Labs: No results for input(s): PROCALCITON, LATICACIDVEN in the last 168 hours.  No results found for this or any previous visit (from the past 240 hour(s)).       Radiology Studies: No results found.      Scheduled Meds: . sodium chloride   Intravenous Once  . gabapentin  100 mg Oral TID  . pantoprazole  40 mg Oral Daily  . rosuvastatin  10 mg Oral QHS   Continuous Infusions: . sodium chloride       LOS: 1 day    Time spent: over 30 min    Fayrene Helper, MD [facility] Pager [phone]  If 7PM-7AM, please contact night-coverage [email or web address] Password [email or web address] 07/16/2018, 2:16 PM

## 2018-07-16 NOTE — Progress Notes (Signed)
Patient swallowed capsule at about 9:19am with a sip of water.  Instructions given to bedside RN and patient.  Patient verbalized understanding.  Will pick up equipment tomorrow morning.

## 2018-07-16 NOTE — Progress Notes (Signed)
Patient called out that capsule monitor has started blinking white then green. Called [facility] nurse and was told to take monitor and leads off and place in bag and leave for pick up in am. Patient has not had a BM. Eulas Post, RN

## 2018-07-16 NOTE — Progress Notes (Signed)
Subjective: She complains of having a headache from not eating.  Objective: Vital signs in last 24 hours: Temp:  [98 F (36.7 C)-99.9 F (37.7 C)] 98 F (36.7 C) (12/04 0448) Pulse Rate:  [54-63] 54 (12/04 0448) Resp:  [17-18] 18 (12/04 0448) BP: (115-129)/(41-50) 117/50 (12/04 0448) SpO2:  [94 %-98 %] 98 % (12/04 0448) Last BM Date: 07/15/18  Intake/Output from previous day: 12/03 0701 - 12/04 0700 In: 240 [P.O.:240] Out: 3825 [Urine:3825] Intake/Output this shift: No intake/output data recorded.  General appearance: alert and no distress GI: soft, non-tender; bowel sounds normal; no masses,  no organomegaly  Lab Results: Recent Labs    07/14/18 1319 07/15/18 0411  WBC 7.5 7.0  HGB 5.3* 8.2*  HCT 17.8* 26.0*  PLT 308 291   BMET Recent Labs    07/14/18 1319 07/15/18 0411  NA 140 141  K 4.7 4.8  CL 109 109  CO2 22 25  GLUCOSE 116* 101*  BUN 24* 25*  CREATININE 1.53* 1.56*  CALCIUM 9.1 8.7*   LFT Recent Labs    07/14/18 1319  PROT 6.9  ALBUMIN 3.5  AST 15  ALT 9  ALKPHOS 48  BILITOT 0.6   PT/INR No results for input(s): LABPROT, INR in the last 72 hours. Hepatitis Panel No results for input(s): HEPBSAG, HCVAB, HEPAIGM, HEPBIGM in the last 72 hours. C-Diff No results for input(s): CDIFFTOX in the last 72 hours. Fecal Lactopherrin No results for input(s): FECLLACTOFRN in the last 72 hours.  Studies/Results: No results found.  Medications:  Scheduled: . sodium chloride   Intravenous Once  . gabapentin  100 mg Oral TID  . pantoprazole  40 mg Oral Daily  . rosuvastatin  10 mg Oral QHS   Continuous: . sodium chloride      Assessment/Plan: 1) IDA. 2) Heme positive stool.   There are no acute changes overnight.  Plan: 1) Capsule endoscopy today.  LOS: 1 day   Asahel Risden D 07/16/2018, 8:07 AM

## 2018-07-17 ENCOUNTER — Encounter (HOSPITAL_COMMUNITY): Payer: Self-pay | Admitting: Gastroenterology

## 2018-07-17 DIAGNOSIS — Z90710 Acquired absence of both cervix and uterus: Secondary | ICD-10-CM | POA: Diagnosis not present

## 2018-07-17 DIAGNOSIS — J449 Chronic obstructive pulmonary disease, unspecified: Secondary | ICD-10-CM | POA: Diagnosis not present

## 2018-07-17 DIAGNOSIS — I252 Old myocardial infarction: Secondary | ICD-10-CM | POA: Diagnosis not present

## 2018-07-17 DIAGNOSIS — Z7902 Long term (current) use of antithrombotics/antiplatelets: Secondary | ICD-10-CM | POA: Diagnosis not present

## 2018-07-17 DIAGNOSIS — Z951 Presence of aortocoronary bypass graft: Secondary | ICD-10-CM | POA: Diagnosis not present

## 2018-07-17 DIAGNOSIS — N183 Chronic kidney disease, stage 3 (moderate): Secondary | ICD-10-CM | POA: Diagnosis not present

## 2018-07-17 DIAGNOSIS — D500 Iron deficiency anemia secondary to blood loss (chronic): Secondary | ICD-10-CM | POA: Diagnosis not present

## 2018-07-17 DIAGNOSIS — D509 Iron deficiency anemia, unspecified: Secondary | ICD-10-CM | POA: Diagnosis not present

## 2018-07-17 DIAGNOSIS — D649 Anemia, unspecified: Secondary | ICD-10-CM | POA: Diagnosis present

## 2018-07-17 DIAGNOSIS — Z79899 Other long term (current) drug therapy: Secondary | ICD-10-CM | POA: Diagnosis not present

## 2018-07-17 DIAGNOSIS — I739 Peripheral vascular disease, unspecified: Secondary | ICD-10-CM | POA: Diagnosis not present

## 2018-07-17 DIAGNOSIS — Z7982 Long term (current) use of aspirin: Secondary | ICD-10-CM | POA: Diagnosis not present

## 2018-07-17 DIAGNOSIS — E785 Hyperlipidemia, unspecified: Secondary | ICD-10-CM | POA: Diagnosis not present

## 2018-07-17 DIAGNOSIS — Z923 Personal history of irradiation: Secondary | ICD-10-CM | POA: Diagnosis not present

## 2018-07-17 DIAGNOSIS — C3491 Malignant neoplasm of unspecified part of right bronchus or lung: Secondary | ICD-10-CM | POA: Diagnosis not present

## 2018-07-17 DIAGNOSIS — K219 Gastro-esophageal reflux disease without esophagitis: Secondary | ICD-10-CM | POA: Diagnosis not present

## 2018-07-17 DIAGNOSIS — I129 Hypertensive chronic kidney disease with stage 1 through stage 4 chronic kidney disease, or unspecified chronic kidney disease: Secondary | ICD-10-CM | POA: Diagnosis not present

## 2018-07-17 DIAGNOSIS — Z87891 Personal history of nicotine dependence: Secondary | ICD-10-CM | POA: Diagnosis not present

## 2018-07-17 DIAGNOSIS — K31811 Angiodysplasia of stomach and duodenum with bleeding: Secondary | ICD-10-CM | POA: Diagnosis not present

## 2018-07-17 DIAGNOSIS — I10 Essential (primary) hypertension: Secondary | ICD-10-CM | POA: Diagnosis not present

## 2018-07-17 DIAGNOSIS — I2510 Atherosclerotic heart disease of native coronary artery without angina pectoris: Secondary | ICD-10-CM | POA: Diagnosis not present

## 2018-07-17 LAB — CBC
HCT: 30.3 % — ABNORMAL LOW (ref 36.0–46.0)
Hemoglobin: 9.3 g/dL — ABNORMAL LOW (ref 12.0–15.0)
MCH: 29 pg (ref 26.0–34.0)
MCHC: 30.7 g/dL (ref 30.0–36.0)
MCV: 94.4 fL (ref 80.0–100.0)
Platelets: 302 10*3/uL (ref 150–400)
RBC: 3.21 MIL/uL — ABNORMAL LOW (ref 3.87–5.11)
RDW: 13.8 % (ref 11.5–15.5)
WBC: 8 10*3/uL (ref 4.0–10.5)
nRBC: 0 % (ref 0.0–0.2)

## 2018-07-17 LAB — BASIC METABOLIC PANEL
Anion gap: 7 (ref 5–15)
BUN: 22 mg/dL (ref 8–23)
CALCIUM: 9.1 mg/dL (ref 8.9–10.3)
CO2: 25 mmol/L (ref 22–32)
Chloride: 107 mmol/L (ref 98–111)
Creatinine, Ser: 1.31 mg/dL — ABNORMAL HIGH (ref 0.44–1.00)
GFR calc Af Amer: 46 mL/min — ABNORMAL LOW (ref >60)
GFR calc non Af Amer: 40 mL/min — ABNORMAL LOW (ref >60)
Glucose, Bld: 99 mg/dL (ref 70–99)
Potassium: 5.1 mmol/L (ref 3.5–5.1)
Sodium: 139 mmol/L (ref 135–145)

## 2018-07-17 LAB — MAGNESIUM: Magnesium: 2 mg/dL (ref 1.7–2.4)

## 2018-07-17 MED ORDER — LUBIPROSTONE 24 MCG PO CAPS
24.0000 ug | ORAL_CAPSULE | Freq: Two times a day (BID) | ORAL | Status: DC
  Filled 2018-07-17: qty 1

## 2018-07-17 MED ORDER — AMLODIPINE BESYLATE 5 MG PO TABS
5.0000 mg | ORAL_TABLET | Freq: Every day | ORAL | Status: DC
  Administered 2018-07-17: 5 mg via ORAL
  Filled 2018-07-17 (×2): qty 1

## 2018-07-17 MED ORDER — SODIUM CHLORIDE 0.9 % IV SOLN
INTRAVENOUS | Status: DC
  Administered 2018-07-17: 23:00:00 via INTRAVENOUS

## 2018-07-17 MED ORDER — FERROUS SULFATE 325 (65 FE) MG PO TABS
325.0000 mg | ORAL_TABLET | Freq: Every day | ORAL | Status: DC
  Administered 2018-07-18: 325 mg via ORAL
  Filled 2018-07-17: qty 1

## 2018-07-17 NOTE — H&P (View-Only) (Signed)
The capsule endoscopy was positive for a duodenal source of bleeding.  It is felt that she has a bleeding AVM.  An enteroscopy will be performed tomorrow.

## 2018-07-17 NOTE — Progress Notes (Signed)
The capsule endoscopy was positive for a duodenal source of bleeding.  It is felt that she has a bleeding AVM.  An enteroscopy will be performed tomorrow.

## 2018-07-17 NOTE — Progress Notes (Signed)
PROGRESS NOTE    [name]  ZWC:[id number] DOB: 11-Jul-1941 DOA: 07/14/2018 PCP: [name], MD   Brief Narrative:  [name] is a 77 y.o. female with a history of GI bleed, GERD, COPD, CAD, hypertension, hyperlipidemia, iron deficiency anemia, CKD stage III.  Patient presented with fatigue, shortness of breath, lightheadedness and found to have severe anemia.  Patient with a hemoglobin of 5.2 on admission and was given 2 units of PRBC with improvement of hemoglobin. Pt admitted for further management. Gastroenterology on board.  Assessment & Plan:   Principal Problem:   Iron deficiency anemia Active Problems:   COPD GOLD II   Peripheral vascular disease (HCC)   CAD (coronary artery disease)   Carotid artery stenosis   Essential hypertension   GERD (gastroesophageal reflux disease)   Occult blood positive stool   GI bleed  Upper GIB likely 2/2 possible duodenal source?? AVM Patient has had multiple endoscopies and treatments Stool hemoccult positive Dr. Benson Norway on board: S/p capsule endoscopy on 07/16/18, showed possible duodenal source of bleeding ??AVM. Plan for enteroscopy on 07/18/18 Continue PPI  Iron deficiency anemia/Severe symptomatic anemia Likely 2/2 above S/P 2U of PRBC Start iron supplementation Daily CBC  GERD Continue Protonix  COPD GOLD II Stable  CKD Stage III  Creatinine around baseline, continue to follow (baseline appears to be ~1.6)  CAD Chest pain free Continue lipitor, Holding home plavix, ASA   DVT prophylaxis: SCD Code Status: full  Family Communication: none at bedside Disposition Plan: pending completion of GI workup, stable H/H   Consultants:   Gastroenterology  Procedures:   Capsule endoscopy  Antimicrobials:  Anti-infectives (From admission, onward)   None     Subjective: No new complaints, reports feeling better. Wanting to know what the capsule endoscopy showed.  Objective: Vitals:   07/16/18 2026  07/17/18 0519 07/17/18 0920 07/17/18 1410  BP: (!) 128/44 127/61 (!) 147/55 (!) 139/56  Pulse: (!) 52 (!) 59 (!) 59 67  Resp: 18 18 (!) 21 (!) 22  Temp: 98.9 F (37.2 C) 98.6 F (37 C) 98.2 F (36.8 C) 98.2 F (36.8 C)  TempSrc: Oral Oral Oral Oral  SpO2: 95% 97% 98% 99%  Weight:      Height:        Intake/Output Summary (Last 24 hours) at 07/17/2018 1534 Last data filed at 07/17/2018 1411 Gross per 24 hour  Intake 720 ml  Output 750 ml  Net -30 ml   Filed Weights   07/14/18 1238 07/16/18 0909  Weight: 73.5 kg 73.5 kg    Examination:  General: NAD   Cardiovascular: S1, S2 present  Respiratory: CTAB  Abdomen: Soft, nontender, nondistended, bowel sounds present  Musculoskeletal: No bilateral pedal edema noted  Skin: Normal  Psychiatry: Normal mood   Data Reviewed: I have personally reviewed following labs and imaging studies  CBC: Recent Labs  Lab 07/14/18 1319 07/15/18 0411 07/16/18 1449 07/17/18 0508  WBC 7.5 7.0 6.6 8.0  HGB 5.3* 8.2* 8.7* 9.3*  HCT 17.8* 26.0* 28.5* 30.3*  MCV 97.3 91.5 94.4 94.4  PLT 308 291 306 235   Basic Metabolic Panel: Recent Labs  Lab 07/14/18 1319 07/15/18 0411 07/16/18 1449 07/17/18 0508  NA 140 141 139 139  K 4.7 4.8 4.2 5.1  CL 109 109 107 107  CO2 22 25 25 25   GLUCOSE 116* 101* 124* 99  BUN 24* 25* 21 22  CREATININE 1.53* 1.56* 1.33* 1.31*  CALCIUM 9.1 8.7* 8.9 9.1  MG  --   --  1.9 2.0   GFR: Estimated Creatinine Clearance: 34.9 mL/min (A) (by C-G formula based on SCr of 1.31 mg/dL (H)). Liver Function Tests: Recent Labs  Lab 07/14/18 1319  AST 15  ALT 9  ALKPHOS 48  BILITOT 0.6  PROT 6.9  ALBUMIN 3.5   No results for input(s): LIPASE, AMYLASE in the last 168 hours. No results for input(s): AMMONIA in the last 168 hours. Coagulation Profile: No results for input(s): INR, PROTIME in the last 168 hours. Cardiac Enzymes: No results for input(s): CKTOTAL, CKMB, CKMBINDEX, TROPONINI in the last 168  hours. BNP (last 3 results) No results for input(s): PROBNP in the last 8760 hours. HbA1C: No results for input(s): HGBA1C in the last 72 hours. CBG: No results for input(s): GLUCAP in the last 168 hours. Lipid Profile: No results for input(s): CHOL, HDL, LDLCALC, TRIG, CHOLHDL, LDLDIRECT in the last 72 hours. Thyroid Function Tests: No results for input(s): TSH, T4TOTAL, FREET4, T3FREE, THYROIDAB in the last 72 hours. Anemia Panel: Recent Labs    07/14/18 1705  VITAMINB12 207  FOLATE >22.4  FERRITIN 4*  TIBC 423  IRON 22*  RETICCTPCT 4.1*   Sepsis Labs: No results for input(s): PROCALCITON, LATICACIDVEN in the last 168 hours.  No results found for this or any previous visit (from the past 240 hour(s)).       Radiology Studies: No results found.      Scheduled Meds: . sodium chloride   Intravenous Once  . amLODipine  5 mg Oral Daily  . gabapentin  100 mg Oral TID  . pantoprazole  40 mg Oral Daily  . rosuvastatin  10 mg Oral QHS   Continuous Infusions: . sodium chloride       LOS: 1 day     Alma Friendly, MD [facility]   If 7PM-7AM, please contact night-coverage 07/17/2018, 3:34 PM

## 2018-07-18 ENCOUNTER — Encounter (HOSPITAL_COMMUNITY)
Admission: EM | Disposition: A | Discharge status: Home / Self Care | Payer: Self-pay | Source: Home / Self Care | Attending: Internal Medicine

## 2018-07-18 ENCOUNTER — Inpatient Hospital Stay (HOSPITAL_COMMUNITY): Payer: Medicare Other | Admitting: Anesthesiology

## 2018-07-18 ENCOUNTER — Encounter (HOSPITAL_COMMUNITY): Payer: Self-pay | Admitting: *Deleted

## 2018-07-18 DIAGNOSIS — I10 Essential (primary) hypertension: Secondary | ICD-10-CM | POA: Diagnosis not present

## 2018-07-18 DIAGNOSIS — D649 Anemia, unspecified: Secondary | ICD-10-CM | POA: Diagnosis not present

## 2018-07-18 DIAGNOSIS — I2510 Atherosclerotic heart disease of native coronary artery without angina pectoris: Secondary | ICD-10-CM | POA: Diagnosis not present

## 2018-07-18 DIAGNOSIS — K31811 Angiodysplasia of stomach and duodenum with bleeding: Secondary | ICD-10-CM | POA: Diagnosis not present

## 2018-07-18 DIAGNOSIS — J449 Chronic obstructive pulmonary disease, unspecified: Secondary | ICD-10-CM | POA: Diagnosis not present

## 2018-07-18 DIAGNOSIS — C3491 Malignant neoplasm of unspecified part of right bronchus or lung: Secondary | ICD-10-CM | POA: Diagnosis not present

## 2018-07-18 DIAGNOSIS — Z7902 Long term (current) use of antithrombotics/antiplatelets: Secondary | ICD-10-CM | POA: Diagnosis not present

## 2018-07-18 DIAGNOSIS — Z7982 Long term (current) use of aspirin: Secondary | ICD-10-CM | POA: Diagnosis not present

## 2018-07-18 DIAGNOSIS — D500 Iron deficiency anemia secondary to blood loss (chronic): Secondary | ICD-10-CM | POA: Diagnosis not present

## 2018-07-18 HISTORY — PX: ENTEROSCOPY: SHX5533

## 2018-07-18 LAB — CBC WITH DIFFERENTIAL/PLATELET
Abs Immature Granulocytes: 0.02 10*3/uL (ref 0.00–0.07)
BASOS ABS: 0.1 10*3/uL (ref 0.0–0.1)
Basophils Relative: 1 %
Eosinophils Absolute: 0.4 10*3/uL (ref 0.0–0.5)
Eosinophils Relative: 6 %
HCT: 32.6 % — ABNORMAL LOW (ref 36.0–46.0)
Hemoglobin: 9.9 g/dL — ABNORMAL LOW (ref 12.0–15.0)
IMMATURE GRANULOCYTES: 0 %
Lymphocytes Relative: 27 %
Lymphs Abs: 2 10*3/uL (ref 0.7–4.0)
MCH: 28 pg (ref 26.0–34.0)
MCHC: 30.4 g/dL (ref 30.0–36.0)
MCV: 92.1 fL (ref 80.0–100.0)
Monocytes Absolute: 0.7 10*3/uL (ref 0.1–1.0)
Monocytes Relative: 9 %
NRBC: 0 % (ref 0.0–0.2)
Neutro Abs: 4.4 10*3/uL (ref 1.7–7.7)
Neutrophils Relative %: 57 %
Platelets: 383 10*3/uL (ref 150–400)
RBC: 3.54 MIL/uL — AB (ref 3.87–5.11)
RDW: 13.8 % (ref 11.5–15.5)
WBC: 7.6 10*3/uL (ref 4.0–10.5)

## 2018-07-18 LAB — BASIC METABOLIC PANEL
ANION GAP: 9 (ref 5–15)
BUN: 26 mg/dL — ABNORMAL HIGH (ref 8–23)
CO2: 24 mmol/L (ref 22–32)
Calcium: 9.2 mg/dL (ref 8.9–10.3)
Chloride: 105 mmol/L (ref 98–111)
Creatinine, Ser: 1.41 mg/dL — ABNORMAL HIGH (ref 0.44–1.00)
GFR calc Af Amer: 42 mL/min — ABNORMAL LOW (ref >60)
GFR calc non Af Amer: 36 mL/min — ABNORMAL LOW (ref >60)
Glucose, Bld: 107 mg/dL — ABNORMAL HIGH (ref 70–99)
Potassium: 4.3 mmol/L (ref 3.5–5.1)
Sodium: 138 mmol/L (ref 135–145)

## 2018-07-18 SURGERY — ENTEROSCOPY
Anesthesia: Monitor Anesthesia Care

## 2018-07-18 MED ORDER — FERROUS SULFATE 325 (65 FE) MG PO TABS: 325.0000 mg | ORAL_TABLET | Freq: Every day | ORAL | 0 refills | Status: DC

## 2018-07-18 MED ORDER — SENNOSIDES-DOCUSATE SODIUM 8.6-50 MG PO TABS: 1.0000 | ORAL_TABLET | Freq: Two times a day (BID) | ORAL | Status: DC

## 2018-07-18 MED ORDER — MAGNESIUM CITRATE PO SOLN
1.0000 | Freq: Once | ORAL | Status: AC
  Administered 2018-07-18: 1 via ORAL
  Filled 2018-07-18: qty 296

## 2018-07-18 MED ORDER — PROPOFOL 10 MG/ML IV BOLUS
INTRAVENOUS | Status: AC
  Filled 2018-07-18: qty 20

## 2018-07-18 MED ORDER — PROPOFOL 500 MG/50ML IV EMUL
INTRAVENOUS | Status: DC | PRN
  Administered 2018-07-18: 150 ug/kg/min via INTRAVENOUS

## 2018-07-18 MED ORDER — PROPOFOL 10 MG/ML IV BOLUS
INTRAVENOUS | Status: AC
  Filled 2018-07-18: qty 60

## 2018-07-18 MED ORDER — POLYETHYLENE GLYCOL 3350 17 G PO PACK: 17.0000 g | PACK | Freq: Every day | ORAL | 0 refills | Status: DC

## 2018-07-18 MED ORDER — LACTATED RINGERS IV SOLN
INTRAVENOUS | Status: DC
  Administered 2018-07-18: 1000 mL via INTRAVENOUS

## 2018-07-18 MED ORDER — LIDOCAINE 2% (20 MG/ML) 5 ML SYRINGE
INTRAMUSCULAR | Status: DC | PRN
  Administered 2018-07-18: 100 mg via INTRAVENOUS

## 2018-07-18 MED ORDER — SENNOSIDES-DOCUSATE SODIUM 8.6-50 MG PO TABS: 1.0000 | ORAL_TABLET | Freq: Every day | ORAL | 0 refills | Status: AC

## 2018-07-18 MED ORDER — POLYETHYLENE GLYCOL 3350 17 G PO PACK: 17.0000 g | PACK | Freq: Two times a day (BID) | ORAL | Status: DC

## 2018-07-18 NOTE — Interval H&P Note (Signed)
History and Physical Interval Note:  07/18/2018 9:37 AM  [name]  has presented today for surgery, with the diagnosis of Bleeding AVM  The various methods of treatment have been discussed with the patient and family. After consideration of risks, benefits and other options for treatment, the patient has consented to  Procedure(s): ENTEROSCOPY (N/A) as a surgical intervention .  The patient's history has been reviewed, patient examined, no change in status, stable for surgery.  I have reviewed the patient's chart and labs.  Questions were answered to the patient's satisfaction.     Gerlene Glassburn D

## 2018-07-18 NOTE — Op Note (Signed)
[facility] Patient Name: [name] Procedure Date: 07/18/2018 MRN: [id number] Attending MD: Carol Ada , MD Date of Birth: [birth year] CSN: [id number] Age: 77 Admit Type: Inpatient Procedure:                Small bowel enteroscopy Indications:              Recurrent bleeding in the small bowel Providers:                Carol Ada, MD, Cleda Daub, RN, Cletis [place],                            Technician, Stephanie [place] CRNA Referring MD:              Medicines:                Propofol per Anesthesia Complications:            No immediate complications. Estimated Blood Loss:     Estimated blood loss: none. Procedure:                Pre-Anesthesia Assessment:                           - Prior to the procedure, a History and Physical                            was performed, and patient medications and                            allergies were reviewed. The patient's tolerance of                            previous anesthesia was also reviewed. The risks                            and benefits of the procedure and the sedation                            options and risks were discussed with the patient.                            All questions were answered, and informed consent                            was obtained. Prior Anticoagulants: The patient has                            taken no previous anticoagulant or antiplatelet                            agents. ASA Grade Assessment: III - A patient with                            severe systemic disease. After reviewing the risks  and benefits, the patient was deemed in                            satisfactory condition to undergo the procedure.                           - Sedation was administered by an anesthesia                            professional. Deep sedation was attained.                           After obtaining informed consent, the endoscope was      passed under direct vision. Throughout the                            procedure, the patient's blood pressure, pulse, and                            oxygen saturations were monitored continuously. The                            [id number]-[id number] ([id number] Olympus peds colonscope was                            introduced through the mouth and advanced to the                            small bowel distal to the Ligament of Treitz. The                            small bowel enteroscopy was accomplished without                            difficulty. The patient tolerated the procedure                            well. Scope In: Scope Out: Findings:      The esophagus was normal.      The stomach was normal.      The examined duodenum was normal.      There was no evidence of significant pathology in the entire examined       portion of jejunum.      Repeated examination of the proximal small bowel was performed. There       was no active bleeding as noted on the VCE. Impression:               - Normal esophagus.                           - Normal stomach.                           - Normal examined duodenum.                           -  The examined portion of the jejunum was normal.                           - No specimens collected. Recommendation:           - Return patient to hospital ward for ongoing care.                           - Resume regular diet.                           - Return to GI clinic in 2 weeks. Procedure Code(s):        --- Professional ---                           [phone], Small intestinal endoscopy, enteroscopy                            beyond second portion of duodenum, not including                            ileum; diagnostic, including collection of                            specimen(s) by brushing or washing, when performed                            (separate procedure) Diagnosis Code(s):        --- Professional ---                           K92.2,  Gastrointestinal hemorrhage, unspecified CPT copyright 2018 American Medical Association. All rights reserved. The codes documented in this report are preliminary and upon coder review may  be revised to meet current compliance requirements. Carol Ada, MD Carol Ada, MD 07/18/2018 10:25:15 AM This report has been signed electronically. Number of Addenda: 0

## 2018-07-18 NOTE — Transfer of Care (Signed)
Immediate Anesthesia Transfer of Care Note  Patient: [name]  Procedure(s) Performed: ENTEROSCOPY (N/A )  Patient Location: PACU and Endoscopy Unit  Anesthesia Type:MAC  Level of Consciousness: awake, alert  and oriented  Airway & Oxygen Therapy: Patient Spontanous Breathing  Post-op Assessment: Report given to RN and Post -op Vital signs reviewed and stable  Post vital signs: Reviewed and stable  Last Vitals:  Vitals Value Taken Time  BP 114/41 07/18/2018 10:19 AM  Temp 36.5 C 07/18/2018 10:19 AM  Pulse 51 07/18/2018 10:20 AM  Resp 20 07/18/2018 10:20 AM  SpO2 100 % 07/18/2018 10:20 AM  Vitals shown include unvalidated device data.  Last Pain:  Vitals:   07/18/18 1019  TempSrc: Oral  PainSc: 0-No pain      Patients Stated Pain Goal: 0 (32/54/98 2641)  Complications: No apparent anesthesia complications

## 2018-07-18 NOTE — Anesthesia Preprocedure Evaluation (Signed)
Anesthesia Evaluation    Reviewed: Allergy & Precautions, Patient's Chart, lab work & pertinent test results  History of Anesthesia Complications (+) PONV  Airway Mallampati: II  TM Distance: >3 FB Neck ROM: Full    Dental  (+) Upper Dentures, Lower Dentures   Pulmonary asthma , COPD,  COPD inhaler, former smoker,    Pulmonary exam normal breath sounds clear to auscultation       Cardiovascular hypertension, Pt. on medications + CAD, + Past MI (2001), + CABG and + Peripheral Vascular Disease  Normal cardiovascular exam Rhythm:Regular Rate:Normal     Neuro/Psych  Headaches,    GI/Hepatic Neg liver ROS, hiatal hernia, GERD  Medicated and Controlled,  Endo/Other  negative endocrine ROS  Renal/GU Renal InsufficiencyRenal disease     Musculoskeletal negative musculoskeletal ROS (+)   Abdominal   Peds  Hematology  (+) anemia ,   Anesthesia Other Findings Day of surgery medications reviewed with the patient.  Reproductive/Obstetrics                             Anesthesia Physical Anesthesia Plan  ASA: III and emergent  Anesthesia Plan: MAC   Post-op Pain Management:    Induction:   PONV Risk Score and Plan: 3 and Treatment may vary due to age or medical condition, Propofol infusion and Ondansetron  Airway Management Planned: Natural Airway and Nasal Cannula  Additional Equipment:   Intra-op Plan:   Post-operative Plan:   Informed Consent: I have reviewed the patients History and Physical, chart, labs and discussed the procedure including the risks, benefits and alternatives for the proposed anesthesia with the patient or authorized representative who has indicated his/her understanding and acceptance.   Dental advisory given  Plan Discussed with: CRNA  Anesthesia Plan Comments:         Anesthesia Quick Evaluation

## 2018-07-18 NOTE — Discharge Summary (Signed)
Discharge Summary  [name] WRU:[id number] DOB: [birth year]  PCP: [name], MD  Admit date: 07/14/2018 Discharge date: 07/18/2018  Time spent: 35 mins  Recommendations for Outpatient Follow-up:  1. PCP in 1 week with repeat labs 2. GI, Dr [name] in 2 weeks  Discharge Diagnoses:  Active Hospital Problems   Diagnosis Date Noted  . Iron deficiency anemia 06/07/2016  . GI bleed 07/15/2018  . Occult blood positive stool 07/14/2018  . Essential hypertension 08/02/2014  . GERD (gastroesophageal reflux disease) 08/02/2014  . Carotid artery stenosis 12/09/2013  . CAD (coronary artery disease) 04/02/2013  . Peripheral vascular disease ([place]) 01/07/2013  . COPD GOLD II 11/17/2012    Resolved Hospital Problems   Diagnosis Date Noted Date Resolved  . GI bleed 07/14/2018 07/14/2018    Discharge Condition: Stable  Diet recommendation: Heart healthy  Vitals:   07/18/18 1104 07/18/18 1306  BP: (!) 137/42 (!) 132/44  Pulse: (!) 48 65  Resp: 20 18  Temp: 97.8 F (36.6 C) 98.8 F (37.1 C)  SpO2: 99% 98%    History of present illness:  [name] a 77 y.o.female with a history of GI bleed, GERD, COPD, CAD, hypertension, hyperlipidemia, iron deficiency anemia, CKD stage III. Patient presented with fatigue, shortness of breath, lightheadedness and found to have severe anemia. Patient with a hemoglobin of 5.2 on admission and was given 2 units of PRBC with improvement of hemoglobin. Pt admitted for further management. Gastroenterology on board.   Today, pt reports no BM in days, otherwise no new complaints. Denies abdominal pain, chest pain, SOB, dizziness, fever/chills. Pt stable to be discharged with close follow up with PCP with repeat labs and GI in 2 weeks.  Hospital Course:  Principal Problem:   Iron deficiency anemia Active Problems:   COPD GOLD II   Peripheral vascular disease (HCC)   CAD (coronary artery disease)   Carotid artery stenosis   Essential  hypertension   GERD (gastroesophageal reflux disease)   Occult blood positive stool   GI bleed  Upper GIB likely 2/2 possible duodenal source?? AVM Patient has had multiple endoscopies and treatments Stool hemoccult positive Dr. Benson Mccullough on board: S/p capsule endoscopy on 07/16/18, showed possible duodenal source of bleeding ??AVM Enteroscopy done on 07/18/18, showed no evidence of active bleeding GI rec d/c with follow up in 2 weeks. Ok to restart plavix and ASA Continue PPI  Iron deficiency anemia/Severe symptomatic anemia Likely 2/2 above S/P 2U of PRBC Continue iron supplementation  GERD Continue Protonix  COPD GOLD II Stable  CKD Stage III  Creatinine around baseline, continue to follow (baseline appears to be ~1.6)  CAD Chest pain free Continue lipitor, restart plavix, and ASA as per GI       Malnutrition Type:      Malnutrition Characteristics:      Nutrition Interventions:      Estimated body mass index is 29.64 kg/m as calculated from the following:   Height as of this encounter: 5\' 2"  (1.575 m).   Weight as of this encounter: 73.5 kg.    Procedures:  Capsule endoscopy on 07/17/18  SB Enteroscopy on 07/18/18  Consultations:  GI, Dr [name]  Discharge Exam: BP (!) 132/44 (BP Location: Right Arm)   Pulse 65   Temp 98.8 F (37.1 C) (Oral)   Resp 18   Ht 5\' 2"  (1.575 m)   Wt 73.5 kg   SpO2 98%   BMI 29.64 kg/m   General: NAD Cardiovascular: S1, S2  present Respiratory: CTAB  Discharge Instructions You were cared for by a hospitalist during your hospital stay. If you have any questions about your discharge medications or the care you received while you were in the hospital after you are discharged, you can call the unit and asked to speak with the hospitalist on call if the hospitalist that took care of you is not available. Once you are discharged, your primary care physician will handle any further medical issues. Please note that NO  REFILLS for any discharge medications will be authorized once you are discharged, as it is imperative that you return to your primary care physician (or establish a relationship with a primary care physician if you do not have one) for your aftercare needs so that they can reassess your need for medications and monitor your lab values.   Allergies as of 07/18/2018      Reactions   Hydromorphone Palpitations, Other (See Comments)   DILAUDID  -  Pt had a Heart Attack after taking Dilaudid.   Levaquin [levofloxacin] Other (See Comments)   Chest pressure, SOB, "pain in between shoulder blades", sweaty -as reported by patient per experience in ED this afternoon   Azithromycin Other (See Comments)   Patient reported past history of lip swelling   Codeine Other (See Comments)   Dr. Terrence Mccullough advised patient not to take this medication   Doxycycline Swelling, Other (See Comments)   Mouth, lips, feet swelling   Oxycodone-acetaminophen Other (See Comments)   Says it makes her feel weird   Risedronate Other (See Comments)   Chest pain   Avelox [moxifloxacin Hcl In Nacl] Palpitations      Medication List    STOP taking these medications   albuterol 108 (90 Base) MCG/ACT inhaler Commonly known as:  PROVENTIL HFA;VENTOLIN HFA   umeclidinium-vilanterol 62.5-25 MCG/INH Aepb Commonly known as:  ANORO ELLIPTA     TAKE these medications   acetaminophen 325 MG tablet Commonly known as:  TYLENOL Take 650 mg by mouth every 6 (six) hours as needed for moderate pain or headache.   amLODipine-olmesartan 5-40 MG tablet Commonly known as:  AZOR Take 1 tablet by mouth daily.   aspirin 81 MG chewable tablet Chew 81 mg by mouth daily.   clopidogrel 75 MG tablet Commonly known as:  PLAVIX Take 1 tablet (75 mg total) by mouth daily.   dexlansoprazole 60 MG capsule Commonly known as:  DEXILANT Take 1 capsule (60 mg total) by mouth daily before breakfast.   ferrous sulfate 325 (65 FE) MG tablet Take  1 tablet (325 mg total) by mouth daily with breakfast. Start taking on:  07/19/2018   gabapentin 100 MG capsule Commonly known as:  NEURONTIN Take 1 capsule (100 mg total) by mouth 3 (three) times daily.   nitroGLYCERIN 0.4 MG SL tablet Commonly known as:  NITROSTAT Place 0.4 mg under the tongue every 5 (five) minutes as needed for chest pain.   polyethylene glycol packet Commonly known as:  MIRALAX / GLYCOLAX Take 17 g by mouth daily.   prednisoLONE acetate 1 % ophthalmic suspension Commonly known as:  PRED FORTE Place 1 drop into the right eye every other day.   rosuvastatin 10 MG tablet Commonly known as:  CRESTOR Take 10 mg by mouth at bedtime.   senna-docusate 8.6-50 MG tablet Commonly known as:  Senokot-S Take 1 tablet by mouth at bedtime.   spironolactone 25 MG tablet Commonly known as:  ALDACTONE Take 25 mg by mouth daily.  Allergies  Allergen Reactions  . Hydromorphone Palpitations and Other (See Comments)    DILAUDID  -  Pt had a Heart Attack after taking Dilaudid.  . Levaquin [Levofloxacin] Other (See Comments)    Chest pressure, SOB, "pain in between shoulder blades", sweaty -as reported by patient per experience in ED this afternoon  . Azithromycin Other (See Comments)    Patient reported past history of lip swelling  . Codeine Other (See Comments)    Dr. Terrence Mccullough advised patient not to take this medication  . Doxycycline Swelling and Other (See Comments)    Mouth, lips, feet swelling  . Oxycodone-Acetaminophen Other (See Comments)    Says it makes her feel weird  . Risedronate Other (See Comments)    Chest pain  . Avelox [Moxifloxacin Hcl In Nacl] Palpitations   Follow-up Information    [name], MD Follow up in 1 week(s).   Specialty:  Family Medicine Contact information: [address] [place] [address] [phone]        Carol Ada, MD. Schedule an appointment as soon as possible for a  visit in 2 week(s).   Specialty:  Gastroenterology Contact information: [address] [place] [address] [phone]            The results of significant diagnostics from this hospitalization (including imaging, microbiology, ancillary and laboratory) are listed below for reference.    Significant Diagnostic Studies: No results found.  Microbiology: No results found for this or any previous visit (from the past 240 hour(s)).   Labs: Basic Metabolic Panel: Recent Labs  Lab 07/14/18 1319 07/15/18 0411 07/16/18 1449 07/17/18 0508 07/18/18 0516  NA 140 141 139 139 138  K 4.7 4.8 4.2 5.1 4.3  CL 109 109 107 107 105  CO2 22 25 25 25 24   GLUCOSE 116* 101* 124* 99 107*  BUN 24* 25* 21 22 26*  CREATININE 1.53* 1.56* 1.33* 1.31* 1.41*  CALCIUM 9.1 8.7* 8.9 9.1 9.2  MG  --   --  1.9 2.0  --    Liver Function Tests: Recent Labs  Lab 07/14/18 1319  AST 15  ALT 9  ALKPHOS 48  BILITOT 0.6  PROT 6.9  ALBUMIN 3.5   No results for input(s): LIPASE, AMYLASE in the last 168 hours. No results for input(s): AMMONIA in the last 168 hours. CBC: Recent Labs  Lab 07/14/18 1319 07/15/18 0411 07/16/18 1449 07/17/18 0508 07/18/18 0516  WBC 7.5 7.0 6.6 8.0 7.6  NEUTROABS  --   --   --   --  4.4  HGB 5.3* 8.2* 8.7* 9.3* 9.9*  HCT 17.8* 26.0* 28.5* 30.3* 32.6*  MCV 97.3 91.5 94.4 94.4 92.1  PLT 308 291 306 302 383   Cardiac Enzymes: No results for input(s): CKTOTAL, CKMB, CKMBINDEX, TROPONINI in the last 168 hours. BNP: BNP (last 3 results) No results for input(s): BNP in the last 8760 hours.  ProBNP (last 3 results) No results for input(s): PROBNP in the last 8760 hours.  CBG: No results for input(s): GLUCAP in the last 168 hours.     Signed:  Alma Friendly, MD [facility] 07/18/2018, 5:05 PM

## 2018-07-18 NOTE — Anesthesia Postprocedure Evaluation (Signed)
Anesthesia Post Note  Patient: [name]  Procedure(s) Performed: ENTEROSCOPY (N/A )     Patient location during evaluation: PACU Anesthesia Type: MAC Level of consciousness: awake and alert Pain management: pain level controlled Vital Signs Assessment: post-procedure vital signs reviewed and stable Respiratory status: spontaneous breathing, nonlabored ventilation and respiratory function stable Cardiovascular status: blood pressure returned to baseline and stable Postop Assessment: no apparent nausea or vomiting Anesthetic complications: no    Last Vitals:  Vitals:   07/18/18 1030 07/18/18 1040  BP: (!) 159/48 (!) 136/44  Pulse: (!) 52 (!) 49  Resp: 16 17  Temp:    SpO2: 97% 100%    Last Pain:  Vitals:   07/18/18 1029  TempSrc:   PainSc: 0-No pain                 Brennan Bailey

## 2018-07-21 ENCOUNTER — Ambulatory Visit: Payer: Medicare Other | Admitting: Pulmonary Disease

## 2018-07-21 ENCOUNTER — Encounter (HOSPITAL_COMMUNITY): Payer: Self-pay | Admitting: Gastroenterology

## 2018-07-28 ENCOUNTER — Other Ambulatory Visit: Payer: Self-pay | Admitting: Gastroenterology

## 2018-07-28 MED ORDER — PROMETHAZINE HCL 25 MG/ML IJ SOLN: 6.2500 mg | INTRAMUSCULAR | Status: DC | PRN

## 2018-07-30 ENCOUNTER — Ambulatory Visit: Payer: Medicare Other | Admitting: Neurology

## 2018-08-01 ENCOUNTER — Ambulatory Visit (HOSPITAL_COMMUNITY): Payer: Medicare Other | Admitting: Anesthesiology

## 2018-08-01 ENCOUNTER — Encounter (HOSPITAL_COMMUNITY): Payer: Self-pay | Admitting: *Deleted

## 2018-08-01 ENCOUNTER — Encounter (HOSPITAL_COMMUNITY)
Admission: RE | Disposition: A | Discharge status: Home / Self Care | Payer: Self-pay | Source: Home / Self Care | Attending: Gastroenterology

## 2018-08-01 ENCOUNTER — Ambulatory Visit (HOSPITAL_COMMUNITY)
Admission: RE | Admit: 2018-08-01 | Discharge: 2018-08-01 | Disposition: A | Discharge status: Home / Self Care | Payer: Medicare Other | Attending: Gastroenterology | Admitting: Gastroenterology

## 2018-08-01 ENCOUNTER — Other Ambulatory Visit: Payer: Self-pay

## 2018-08-01 DIAGNOSIS — Z951 Presence of aortocoronary bypass graft: Secondary | ICD-10-CM | POA: Diagnosis not present

## 2018-08-01 DIAGNOSIS — Z87891 Personal history of nicotine dependence: Secondary | ICD-10-CM | POA: Diagnosis not present

## 2018-08-01 DIAGNOSIS — K921 Melena: Secondary | ICD-10-CM | POA: Diagnosis not present

## 2018-08-01 DIAGNOSIS — I10 Essential (primary) hypertension: Secondary | ICD-10-CM | POA: Diagnosis not present

## 2018-08-01 DIAGNOSIS — K449 Diaphragmatic hernia without obstruction or gangrene: Secondary | ICD-10-CM | POA: Diagnosis not present

## 2018-08-01 DIAGNOSIS — I2510 Atherosclerotic heart disease of native coronary artery without angina pectoris: Secondary | ICD-10-CM | POA: Diagnosis not present

## 2018-08-01 DIAGNOSIS — K31811 Angiodysplasia of stomach and duodenum with bleeding: Secondary | ICD-10-CM | POA: Diagnosis not present

## 2018-08-01 DIAGNOSIS — R51 Headache: Secondary | ICD-10-CM | POA: Diagnosis not present

## 2018-08-01 DIAGNOSIS — K219 Gastro-esophageal reflux disease without esophagitis: Secondary | ICD-10-CM | POA: Insufficient documentation

## 2018-08-01 DIAGNOSIS — J449 Chronic obstructive pulmonary disease, unspecified: Secondary | ICD-10-CM | POA: Insufficient documentation

## 2018-08-01 DIAGNOSIS — I252 Old myocardial infarction: Secondary | ICD-10-CM | POA: Diagnosis not present

## 2018-08-01 DIAGNOSIS — I739 Peripheral vascular disease, unspecified: Secondary | ICD-10-CM | POA: Insufficient documentation

## 2018-08-01 DIAGNOSIS — D649 Anemia, unspecified: Secondary | ICD-10-CM | POA: Diagnosis not present

## 2018-08-01 HISTORY — PX: HOT HEMOSTASIS: SHX5433

## 2018-08-01 HISTORY — PX: ESOPHAGOGASTRODUODENOSCOPY: SHX5428

## 2018-08-01 SURGERY — EGD (ESOPHAGOGASTRODUODENOSCOPY)
Anesthesia: Monitor Anesthesia Care

## 2018-08-01 MED ORDER — GLUCAGON HCL RDNA (DIAGNOSTIC) 1 MG IJ SOLR
INTRAMUSCULAR | Status: DC | PRN
  Administered 2018-08-01: .5 mg via INTRAVENOUS

## 2018-08-01 MED ORDER — PROPOFOL 500 MG/50ML IV EMUL
INTRAVENOUS | Status: DC | PRN
  Administered 2018-08-01: 20 mg via INTRAVENOUS
  Administered 2018-08-01: 30 mg via INTRAVENOUS

## 2018-08-01 MED ORDER — LACTATED RINGERS IV SOLN
INTRAVENOUS | Status: DC
  Administered 2018-08-01: 1000 mL via INTRAVENOUS

## 2018-08-01 MED ORDER — ONDANSETRON HCL 4 MG/2ML IJ SOLN
INTRAMUSCULAR | Status: DC | PRN
  Administered 2018-08-01: 4 mg via INTRAVENOUS

## 2018-08-01 MED ORDER — SPOT INK MARKER SYRINGE KIT
PACK | SUBMUCOSAL | Status: AC
  Filled 2018-08-01: qty 10

## 2018-08-01 MED ORDER — SODIUM CHLORIDE 0.9 % IV SOLN: INTRAVENOUS | Status: DC

## 2018-08-01 MED ORDER — PROPOFOL 500 MG/50ML IV EMUL
INTRAVENOUS | Status: DC | PRN
  Administered 2018-08-01: 100 ug/kg/min via INTRAVENOUS

## 2018-08-01 MED ORDER — SPOT INK MARKER SYRINGE KIT
PACK | SUBMUCOSAL | Status: DC | PRN
  Administered 2018-08-01: 2 mL via SUBMUCOSAL

## 2018-08-01 NOTE — Interval H&P Note (Signed)
History and Physical Interval Note:  08/01/2018 10:34 AM  [name]  has presented today for surgery, with the diagnosis of varices  The various methods of treatment have been discussed with the patient and family. After consideration of risks, benefits and other options for treatment, the patient has consented to  Procedure(s): ESOPHAGOGASTRODUODENOSCOPY (EGD) (N/A) HOT HEMOSTASIS (ARGON PLASMA COAGULATION/BICAP) (N/A) as a surgical intervention .  The patient's history has been reviewed, patient examined, no change in status, stable for surgery.  I have reviewed the patient's chart and labs.  Questions were answered to the patient's satisfaction.     Rolla Kedzierski D

## 2018-08-01 NOTE — Op Note (Signed)
[facility] Patient Name: [name] Procedure Date: 08/01/2018 MRN: [id number] Attending MD: Carol Ada , MD Date of Birth: 10-Jun-1941 CSN: [id number] Age: 77 Admit Type: Outpatient Procedure:                Upper GI endoscopy Indications:              Melena Providers:                Carol Ada, MD, Zenon Mayo, RN, William Dalton, Technician Referring MD:              Medicines:                Propofol per Anesthesia Complications:            No immediate complications. Estimated Blood Loss:     Estimated blood loss was minimal. Procedure:                Pre-Anesthesia Assessment:                           - Prior to the procedure, a History and Physical                            was performed, and patient medications and                            allergies were reviewed. The patient's tolerance of                            previous anesthesia was also reviewed. The risks                            and benefits of the procedure and the sedation                            options and risks were discussed with the patient.                            All questions were answered, and informed consent                            was obtained. Prior Anticoagulants: The patient has                            taken Plavix (clopidogrel), last dose was day of                            procedure. ASA Grade Assessment: III - A patient                            with severe systemic disease. After reviewing the  risks and benefits, the patient was deemed in                            satisfactory condition to undergo the procedure.                           - Sedation was administered by an anesthesia                            professional. Deep sedation was attained.                           After obtaining informed consent, the endoscope was                            passed under direct vision. Throughout the                             procedure, the patient's blood pressure, pulse, and                            oxygen saturations were monitored continuously. The                            [id number]-[id number] ([id number] Olympus adult endoscope was                            introduced through the mouth, and advanced to the                            fourth part of duodenum. The upper GI endoscopy was                            accomplished without difficulty. The patient                            tolerated the procedure well. Scope In: Scope Out: Findings:      The esophagus was normal.      The stomach was normal.      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 Coagulation for hemostasis using       monopolar probe was successful. To prevent bleeding post-intervention,       two hemostatic clips were successfully placed (MR unsafe). There was no       bleeding at the end of the procedure. Area was tattooed with an       injection of 1 mL of Spot (carbon black).      The first pass through the duodenum did not yield any bleeding sites.       During the second pass, active bleeding was identified. The exact       location is not certain, but it is felt to be in the distal third or       proximal fouth portion of the duodenum. APC was applied to the area and  the bleeding was arrested. Because she was kept on Plavix, two hemoclips       were deployed to prevent any further bleeding. The area was tattooed       with SPOT and the location of the tattoo was just distal to the       hemoclips. Proximal injection was attempted, but the needle only went       through the mucosal fold and not submucosally. 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 Impression:               - Normal esophagus.                           - Normal stomach.                           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 Treated with a monopolar probe. Clips (MR                            unsafe) were placed. Tattooed.                           - No specimens collected. Moderate Sedation:      Not Applicable - Patient had care per Anesthesia. Recommendation:           - Patient has a contact number available for                            emergencies. The signs and symptoms of potential                            delayed complications were discussed with the                            patient. Return to normal activities tomorrow.                            Written discharge instructions were provided to the                            patient.                           - Resume previous diet.                           - Continue present medications.                           - Return to GI clinic in 4 weeks. 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 All rights reserved. The codes documented in this report are preliminary and upon coder review may  be revised to meet current compliance requirements. Carol Ada, MD Carol Ada, MD 08/01/2018 12:27:56 PM This report has been signed electronically. Number of Addenda: 0

## 2018-08-01 NOTE — Transfer of Care (Signed)
Immediate Anesthesia Transfer of Care Note  Patient: PAOLINA KARWOWSKI  Procedure(s) Performed: Procedure(s): ESOPHAGOGASTRODUODENOSCOPY (EGD) (N/A) HOT HEMOSTASIS (ARGON PLASMA COAGULATION/BICAP) (N/A) SUBMUCOSAL TATTOO INJECTION  Patient Location: PACU  Anesthesia Type:General  Level of Consciousness:  sedated, patient cooperative and responds to stimulation  Airway & Oxygen Therapy:Patient Spontanous Breathing and Patient connected to face mask oxgen  Post-op Assessment:  Report given to PACU RN and Post -op Vital signs reviewed and stable  Post vital signs:  Reviewed and stable  Last Vitals:  Vitals:   08/01/18 1011 08/01/18 1018  BP:  (!) 143/48  Pulse:  (!) 55  Resp:  14  Temp: (!) 36.4 C 36.8 C  SpO2:  37%    Complications: No apparent anesthesia complications

## 2018-08-01 NOTE — Anesthesia Preprocedure Evaluation (Signed)
Anesthesia Evaluation    Reviewed: Allergy & Precautions, Patient's Chart, lab work & pertinent test results  History of Anesthesia Complications (+) PONV and history of anesthetic complications  Airway Mallampati: II  TM Distance: >3 FB Neck ROM: Full    Dental  (+) Upper Dentures, Lower Dentures   Pulmonary asthma , COPD,  COPD inhaler, former smoker,    Pulmonary exam normal breath sounds clear to auscultation       Cardiovascular hypertension, Pt. on medications + CAD, + Past MI (2001), + CABG and + Peripheral Vascular Disease  Normal cardiovascular exam Rhythm:Regular Rate:Normal     Neuro/Psych  Headaches,    GI/Hepatic Neg liver ROS, hiatal hernia, GERD  Medicated and Controlled,  Endo/Other  negative endocrine ROS  Renal/GU Renal InsufficiencyRenal disease     Musculoskeletal negative musculoskeletal ROS (+)   Abdominal   Peds  Hematology  (+) Blood dyscrasia, anemia ,   Anesthesia Other Findings Day of surgery medications reviewed with the patient.  Reproductive/Obstetrics                             Anesthesia Physical  Anesthesia Plan  ASA: III and emergent  Anesthesia Plan: MAC   Post-op Pain Management:    Induction:   PONV Risk Score and Plan: 3 and Treatment may vary due to age or medical condition, Propofol infusion and Ondansetron  Airway Management Planned: Natural Airway and Nasal Cannula  Additional Equipment:   Intra-op Plan:   Post-operative Plan:   Informed Consent: I have reviewed the patients History and Physical, chart, labs and discussed the procedure including the risks, benefits and alternatives for the proposed anesthesia with the patient or authorized representative who has indicated his/her understanding and acceptance.   Dental advisory given  Plan Discussed with: CRNA  Anesthesia Plan Comments:         Anesthesia Quick  Evaluation

## 2018-08-01 NOTE — Discharge Instructions (Signed)

## 2018-08-03 ENCOUNTER — Encounter (HOSPITAL_COMMUNITY): Payer: Self-pay | Admitting: Gastroenterology

## 2018-08-04 NOTE — Anesthesia Postprocedure Evaluation (Signed)
Anesthesia Post Note  Patient: [name]  Procedure(s) Performed: ESOPHAGOGASTRODUODENOSCOPY (EGD) (N/A ) HOT HEMOSTASIS (ARGON PLASMA COAGULATION/BICAP) (N/A ) [place] PLACEMENT     Patient location during evaluation: PACU Anesthesia Type: MAC Level of consciousness: awake and alert Pain management: pain level controlled Vital Signs Assessment: post-procedure vital signs reviewed and stable Respiratory status: spontaneous breathing, nonlabored ventilation, respiratory function stable and patient connected to nasal cannula oxygen Cardiovascular status: stable and blood pressure returned to baseline Postop Assessment: no apparent nausea or vomiting Anesthetic complications: no    Last Vitals:  Vitals:   08/01/18 1240 08/01/18 1250  BP: (!) 117/41   Pulse: (!) 52 (!) 53  Resp: 12 18  Temp:    SpO2: 100% 100%    Last Pain:  Vitals:   08/04/18 1329  TempSrc:   PainSc: 0-No pain                 Jourdan Durbin

## 2018-08-20 ENCOUNTER — Encounter: Payer: Self-pay | Admitting: Vascular Surgery

## 2018-08-20 ENCOUNTER — Ambulatory Visit (INDEPENDENT_AMBULATORY_CARE_PROVIDER_SITE_OTHER): Payer: Medicare Other | Admitting: Vascular Surgery

## 2018-08-20 ENCOUNTER — Ambulatory Visit (HOSPITAL_COMMUNITY)
Admission: RE | Admit: 2018-08-20 | Discharge: 2018-08-20 | Disposition: A | Discharge status: Home / Self Care | Payer: Medicare Other | Source: Ambulatory Visit | Attending: Vascular Surgery | Admitting: Vascular Surgery

## 2018-08-20 ENCOUNTER — Other Ambulatory Visit: Payer: Self-pay

## 2018-08-20 ENCOUNTER — Ambulatory Visit: Payer: Medicare Other | Admitting: Pulmonary Disease

## 2018-08-20 VITALS — BP 136/59 | HR 64 | Temp 98.0°F | Resp 18 | Ht 62.0 in | Wt 159.0 lb

## 2018-08-20 DIAGNOSIS — I6523 Occlusion and stenosis of bilateral carotid arteries: Secondary | ICD-10-CM

## 2018-08-20 DIAGNOSIS — Z9889 Other specified postprocedural states: Secondary | ICD-10-CM | POA: Diagnosis present

## 2018-08-20 DIAGNOSIS — I771 Stricture of artery: Secondary | ICD-10-CM

## 2018-08-20 NOTE — Progress Notes (Signed)
Patient name: [name] MRN: [id number] DOB: 1940/10/28 Sex: female  REASON FOR VISIT:   Follow-up of bilateral carotid disease and also bilateral iliac disease.  HPI:   [name] is a pleasant 78 y.o. female who I last saw on 02/12/2018.  Patient's had a previous left carotid endarterectomy in 2011.  The patient also has bilateral iliac stents.  At the time of her last visit she had a 60 to 79% right carotid stenosis with no evidence of recurrent stenosis on the left.  With respect to her lower extremity arterial occlusive disease, her ABI in the right was 98% and ABI on the left was 97%.  She comes in for a six-month follow-up visit.  Since I saw her last she denies any significant claudication, rest pain, or nonhealing ulcers.  She denies any history of stroke, TIAs, expressive or receptive aphasia, or amaurosis fugax.  She did have an upper GI bleed and at that time her aspirin was stopped.  She remains on Plavix and a statin.  There have been no other significant changes to her medical history.  Past Medical History:  Diagnosis Date  . 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 Asthma   . 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  . Blood in stool    last week of aug 2018  . Carotid artery occlusion   . Complication of anesthesia   . COPD (chronic obstructive pulmonary disease) ([place])   . Coronary artery disease   . 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 Diverticulitis   . Dyspnea    with exertion  . Gastroesophageal reflux disease    occ  . Glaucoma    right eye  . Headache    tension  . Hiatal hernia   . Hyperlipidemia   . Hypertension   . Iliac artery aneurysm ([place])   . Lung cancer ([place]) dx 2018   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  . Myocardial infarction [facility]) 01/01/2000   Cardiac catheterization  . Peripheral vascular disease ([place])    stents in legs x 2 or  3  . Pneumonia    last time winter 2017 -2018  . PONV (postoperative nausea and vomiting)    occassionally, last colonscopy did ok with anesthesia  . Reflux     Family History  Problem Relation Age of Onset  . Heart disease Mother        Heart Disease before age 74  . Hypertension Mother   . Hyperlipidemia Mother   . Heart attack Mother   . Clotting disorder Mother   . Heart disease Father        Heart Disease before age 53  . Heart attack Father   . Hyperlipidemia Father   . Hypertension Father   . Heart disease Brother        Heart Disease before age 24  . Hyperlipidemia Brother   . Hypertension Brother   . Clotting disorder Brother   . AAA (abdominal aortic aneurysm) Brother   . Cerebral aneurysm Sister   . Hypertension Sister   . AAA (abdominal aortic aneurysm) Sister   . Asthma Sister   . Cerebral aneurysm Brother   . Cancer Brother        Lung  . Hypertension Brother   . Heart attack Brother   . Heart disease Brother        Aneurysm of  Brain  . Hypertension Brother   . Heart disease Brother   . Heart disease Brother   . Stroke Son        Aneurysm of Stomach  . AAA (abdominal aortic aneurysm) Son   . Cancer Maternal Uncle        great uncle/cancer/type unknown    SOCIAL HISTORY: Social History   Tobacco Use  . Smoking status: Former Smoker    Packs/day: 1.50    Years: 30.00    Pack years: 45.00    Types: Cigarettes    Last attempt to quit: 08/13/2000    Years since quitting: 18.0  . Smokeless tobacco: Never Used  Substance Use Topics  . Alcohol use: No    Alcohol/week: 0.0 standard drinks    Allergies  Allergen Reactions  . Hydromorphone Palpitations and Other (See Comments)    DILAUDID  -  Pt had a Heart Attack after taking Dilaudid.  . Levaquin [Levofloxacin] Other (See Comments)    Chest pressure, SOB, "pain in between shoulder blades", sweaty -as reported by patient per experience in ED this afternoon  . Azithromycin Other (See Comments)      Patient reported past history of lip swelling  . Codeine Other (See Comments)    Dr. Terrence Dupont advised patient not to take this medication  . Doxycycline Swelling and Other (See Comments)    Mouth, lips, feet swelling  . Oxycodone-Acetaminophen Other (See Comments)    Says it makes her feel weird  . Risedronate Other (See Comments)    Chest pain  . Avelox [Moxifloxacin Hcl In Nacl] Palpitations    Current Outpatient Medications  Medication Sig Dispense Refill  . acetaminophen (TYLENOL) 325 MG tablet Take 650 mg by mouth every 6 (six) hours as needed for moderate pain or headache.     Marland Kitchen amLODipine-olmesartan (AZOR) 5-40 MG tablet Take 1 tablet by mouth daily.    . clopidogrel (PLAVIX) 75 MG tablet Take 1 tablet (75 mg total) by mouth daily. 90 tablet 3  . dexlansoprazole (DEXILANT) 60 MG capsule Take 1 capsule (60 mg total) by mouth daily before breakfast. 30 capsule 11  . gabapentin (NEURONTIN) 100 MG capsule Take 1 capsule (100 mg total) by mouth 3 (three) times daily. 90 capsule 0  . nitroGLYCERIN (NITROSTAT) 0.4 MG SL tablet Place 0.4 mg under the tongue every 5 (five) minutes as needed for chest pain.     . prednisoLONE acetate (PRED FORTE) 1 % ophthalmic suspension Place 1 drop into the right eye every other day.     . rosuvastatin (CRESTOR) 10 MG tablet Take 10 mg by mouth at bedtime.     Marland Kitchen spironolactone (ALDACTONE) 25 MG tablet Take 25 mg by mouth daily.    Marland Kitchen aspirin 81 MG chewable tablet Chew 81 mg by mouth daily.    . ferrous sulfate 325 (65 FE) MG tablet Take 1 tablet (325 mg total) by mouth daily with breakfast. 30 tablet 0  . polyethylene glycol (MIRALAX / GLYCOLAX) packet Take 17 g by mouth daily. (Patient not taking: Reported on 08/20/2018) 14 each 0   No current facility-administered medications for this visit.    Facility-Administered Medications Ordered in Other Visits  Medication Dose Route Frequency Provider Last Rate Last Dose  . promethazine (PHENERGAN) injection  6.25-12.5 mg  6.25-12.5 mg Intravenous Q15 min PRN Brennan Bailey, MD        REVIEW OF SYSTEMS:  [X]  denotes positive finding, [ ]  denotes negative finding Cardiac  Comments:  Chest pain or chest pressure:    Shortness of breath upon exertion: x   Short of breath when lying flat: x   Irregular heart rhythm:        Vascular    Pain in calf, thigh, or hip brought on by ambulation: x   Pain in feet at night that wakes you up from your sleep:     Blood clot in your veins:    Leg swelling:         Pulmonary    Oxygen at home:    Productive cough:     Wheezing:  x       Neurologic    Sudden weakness in arms or legs:     Sudden numbness in arms or legs:     Sudden onset of difficulty speaking or slurred speech:    Temporary loss of vision in one eye:     Problems with dizziness:         Gastrointestinal    Blood in stool:  x   Vomited blood:         Genitourinary    Burning when urinating:     Blood in urine:        Psychiatric    Major depression:         Hematologic    Bleeding problems:    Problems with blood clotting too easily:        Skin    Rashes or ulcers:        Constitutional    Fever or chills:     PHYSICAL EXAM:   Vitals:   08/20/18 1038  BP: (!) 136/59  Pulse: 64  Resp: 18  Temp: 98 F (36.7 C)  SpO2: 98%  Weight: 159 lb (72.1 kg)  Height: 5\' 2"  (1.575 m)    GENERAL: The patient is a well-nourished female, in no acute distress. The vital signs are documented above. CARDIAC: There is a regular rate and rhythm.  VASCULAR: She has a soft right carotid bruit. She has palpable femoral pulses and palpable dorsalis pedis pulses. PULMONARY: There is good air exchange bilaterally without wheezing or rales. ABDOMEN: Soft and non-tender with normal pitched bowel sounds.  MUSCULOSKELETAL: There are no major deformities or cyanosis. NEUROLOGIC: No focal weakness or paresthesias are detected. SKIN: There are no ulcers or rashes noted. PSYCHIATRIC:  The patient has a normal affect.  DATA:    CAROTID DUPLEX: I have independently interpreted her carotid duplex scan today.  On the right side there is a 40 to 59% carotid stenosis.  There is no evidence of recurrent carotid stenosis on the left.  Both vertebral arteries are patent with antegrade flow.   MEDICAL ISSUES:   BILATERAL CAROTID DISEASE: The patient remains asymptomatic.  Her left carotid endarterectomy site is widely patent without evidence of restenosis.  On the right side the stenosis has improved and is now in the 40 to 59% category.  She is on Plavix and is on a statin.  I have ordered a follow-up carotid duplex scan in 9 months and I will see her back at that time.  STATUS POST BILATERAL ILIAC STENTS: This patient has palpable femoral pulses and palpable pedal pulses.  She is asymptomatic.  I am trying to coordinate her carotid follow-up with her iliac stent follow-up and therefore I have scheduled her ABIs and iliac stent duplex in 9 months also.  I encouraged her to stay as active as possible.  Fortunately she is not  a smoker.  Deitra Mayo [facility] [phone]

## 2018-08-21 ENCOUNTER — Ambulatory Visit: Payer: Medicare Other | Admitting: Pulmonary Disease

## 2018-08-25 ENCOUNTER — Inpatient Hospital Stay: Payer: Medicare Other | Attending: Internal Medicine

## 2018-08-25 ENCOUNTER — Ambulatory Visit (HOSPITAL_COMMUNITY)
Admission: RE | Admit: 2018-08-25 | Discharge: 2018-08-25 | Disposition: A | Discharge status: Home / Self Care | Payer: Medicare Other | Source: Ambulatory Visit | Attending: Internal Medicine | Admitting: Internal Medicine

## 2018-08-25 ENCOUNTER — Encounter (HOSPITAL_COMMUNITY): Payer: Self-pay

## 2018-08-25 DIAGNOSIS — I6521 Occlusion and stenosis of right carotid artery: Secondary | ICD-10-CM | POA: Diagnosis not present

## 2018-08-25 DIAGNOSIS — D638 Anemia in other chronic diseases classified elsewhere: Secondary | ICD-10-CM | POA: Insufficient documentation

## 2018-08-25 DIAGNOSIS — I10 Essential (primary) hypertension: Secondary | ICD-10-CM | POA: Diagnosis not present

## 2018-08-25 DIAGNOSIS — Z79899 Other long term (current) drug therapy: Secondary | ICD-10-CM | POA: Insufficient documentation

## 2018-08-25 DIAGNOSIS — C3431 Malignant neoplasm of lower lobe, right bronchus or lung: Secondary | ICD-10-CM | POA: Diagnosis present

## 2018-08-25 DIAGNOSIS — H409 Unspecified glaucoma: Secondary | ICD-10-CM | POA: Diagnosis not present

## 2018-08-25 DIAGNOSIS — C3490 Malignant neoplasm of unspecified part of unspecified bronchus or lung: Secondary | ICD-10-CM | POA: Diagnosis present

## 2018-08-25 DIAGNOSIS — Q0700 Arnold-Chiari syndrome without spina bifida or hydrocephalus: Secondary | ICD-10-CM | POA: Diagnosis not present

## 2018-08-25 DIAGNOSIS — E785 Hyperlipidemia, unspecified: Secondary | ICD-10-CM | POA: Diagnosis not present

## 2018-08-25 DIAGNOSIS — J449 Chronic obstructive pulmonary disease, unspecified: Secondary | ICD-10-CM | POA: Diagnosis not present

## 2018-08-25 DIAGNOSIS — D509 Iron deficiency anemia, unspecified: Secondary | ICD-10-CM | POA: Diagnosis not present

## 2018-08-25 DIAGNOSIS — I252 Old myocardial infarction: Secondary | ICD-10-CM | POA: Diagnosis not present

## 2018-08-25 DIAGNOSIS — K219 Gastro-esophageal reflux disease without esophagitis: Secondary | ICD-10-CM | POA: Insufficient documentation

## 2018-08-25 DIAGNOSIS — I2510 Atherosclerotic heart disease of native coronary artery without angina pectoris: Secondary | ICD-10-CM | POA: Insufficient documentation

## 2018-08-25 DIAGNOSIS — Z951 Presence of aortocoronary bypass graft: Secondary | ICD-10-CM | POA: Insufficient documentation

## 2018-08-25 LAB — CBC WITH DIFFERENTIAL (CANCER CENTER ONLY)
ABS IMMATURE GRANULOCYTES: 0.01 10*3/uL (ref 0.00–0.07)
Basophils Absolute: 0 10*3/uL (ref 0.0–0.1)
Basophils Relative: 1 %
Eosinophils Absolute: 0.3 10*3/uL (ref 0.0–0.5)
Eosinophils Relative: 5 %
HEMATOCRIT: 33.1 % — AB (ref 36.0–46.0)
Hemoglobin: 10.4 g/dL — ABNORMAL LOW (ref 12.0–15.0)
Immature Granulocytes: 0 %
LYMPHS ABS: 2 10*3/uL (ref 0.7–4.0)
Lymphocytes Relative: 30 %
MCH: 28.2 pg (ref 26.0–34.0)
MCHC: 31.4 g/dL (ref 30.0–36.0)
MCV: 89.7 fL (ref 80.0–100.0)
Monocytes Absolute: 0.6 10*3/uL (ref 0.1–1.0)
Monocytes Relative: 9 %
Neutro Abs: 3.6 10*3/uL (ref 1.7–7.7)
Neutrophils Relative %: 55 %
Platelet Count: 276 10*3/uL (ref 150–400)
RBC: 3.69 MIL/uL — ABNORMAL LOW (ref 3.87–5.11)
RDW: 13.8 % (ref 11.5–15.5)
WBC Count: 6.5 10*3/uL (ref 4.0–10.5)
nRBC: 0 % (ref 0.0–0.2)

## 2018-08-25 LAB — CMP (CANCER CENTER ONLY)
ALT: 10 U/L (ref 0–44)
AST: 17 U/L (ref 15–41)
Albumin: 3.8 g/dL (ref 3.5–5.0)
Alkaline Phosphatase: 70 U/L (ref 38–126)
Anion gap: 8 (ref 5–15)
BUN: 17 mg/dL (ref 8–23)
CO2: 25 mmol/L (ref 22–32)
Calcium: 9.9 mg/dL (ref 8.9–10.3)
Chloride: 107 mmol/L (ref 98–111)
Creatinine: 1.47 mg/dL — ABNORMAL HIGH (ref 0.44–1.00)
GFR, Est AFR Am: 40 mL/min — ABNORMAL LOW (ref >60)
GFR, Estimated: 35 mL/min — ABNORMAL LOW (ref >60)
Glucose, Bld: 107 mg/dL — ABNORMAL HIGH (ref 70–99)
Potassium: 5.2 mmol/L — ABNORMAL HIGH (ref 3.5–5.1)
Sodium: 140 mmol/L (ref 135–145)
Total Protein: 7.8 g/dL (ref 6.5–8.1)

## 2018-08-25 LAB — IRON AND TIBC
Iron: 36 ug/dL — ABNORMAL LOW (ref 41–142)
Saturation Ratios: 10 % — ABNORMAL LOW (ref 21–57)
TIBC: 380 ug/dL (ref 236–444)
UIBC: 344 ug/dL (ref 120–384)

## 2018-08-25 LAB — FERRITIN: Ferritin: 18 ng/mL (ref 11–307)

## 2018-08-26 ENCOUNTER — Encounter: Payer: Self-pay | Admitting: Pulmonary Disease

## 2018-08-26 ENCOUNTER — Ambulatory Visit (INDEPENDENT_AMBULATORY_CARE_PROVIDER_SITE_OTHER): Payer: Medicare Other | Admitting: Pulmonary Disease

## 2018-08-26 ENCOUNTER — Other Ambulatory Visit: Payer: Self-pay | Admitting: Pulmonary Disease

## 2018-08-26 VITALS — BP 124/72 | HR 73 | Ht 62.0 in | Wt 157.0 lb

## 2018-08-26 DIAGNOSIS — Z85118 Personal history of other malignant neoplasm of bronchus and lung: Secondary | ICD-10-CM

## 2018-08-26 DIAGNOSIS — Z923 Personal history of irradiation: Secondary | ICD-10-CM

## 2018-08-26 DIAGNOSIS — J449 Chronic obstructive pulmonary disease, unspecified: Secondary | ICD-10-CM

## 2018-08-26 DIAGNOSIS — R0602 Shortness of breath: Secondary | ICD-10-CM

## 2018-08-26 DIAGNOSIS — C3431 Malignant neoplasm of lower lobe, right bronchus or lung: Secondary | ICD-10-CM

## 2018-08-26 LAB — PULMONARY FUNCTION TEST
DL/VA % pred: 97 %
DL/VA: 4.44 ml/min/mmHg/L
DLCO COR % PRED: 61 %
DLCO cor: 13.35 ml/min/mmHg
DLCO unc % pred: 55 %
DLCO unc: 11.93 ml/min/mmHg
FEF 25-75 Post: 0.91 L/sec
FEF 25-75 Pre: 0.61 L/sec
FEF2575-%Change-Post: 50 %
FEF2575-%Pred-Post: 58 %
FEF2575-%Pred-Pre: 39 %
FEV1-%Change-Post: 12 %
FEV1-%Pred-Post: 65 %
FEV1-%Pred-Pre: 58 %
FEV1-Post: 1.26 L
FEV1-Pre: 1.12 L
FEV1FVC-%Change-Post: 0 %
FEV1FVC-%Pred-Pre: 88 %
FEV6-%Change-Post: 12 %
FEV6-%Pred-Post: 77 %
FEV6-%Pred-Pre: 69 %
FEV6-Post: 1.9 L
FEV6-Pre: 1.68 L
FEV6FVC-%Change-Post: 0 %
FEV6FVC-%Pred-Post: 105 %
FEV6FVC-%Pred-Pre: 105 %
FVC-%Change-Post: 13 %
FVC-%PRED-POST: 74 %
FVC-%Pred-Pre: 65 %
FVC-POST: 1.91 L
FVC-Pre: 1.69 L
POST FEV1/FVC RATIO: 66 %
Post FEV6/FVC ratio: 100 %
Pre FEV1/FVC ratio: 67 %
Pre FEV6/FVC Ratio: 100 %
RV % pred: 94 %
RV: 2.07 L
TLC % pred: 81 %
TLC: 3.85 L

## 2018-08-26 NOTE — Patient Instructions (Signed)
Thank you for visiting Dr. Valeta Harms at [facility]. Today we recommend the following:  Please restart Anora Ellipta, 1 puff once daily. Please let us know if you need any refills once you check your supply at home. Continue albuterol as needed for shortness of breath and wheezing.  Return in about 6 months (around 02/24/2019).

## 2018-08-26 NOTE — Progress Notes (Signed)
PFT done today. 

## 2018-08-26 NOTE — Progress Notes (Signed)
Synopsis: Referred in January 2020 to establish care, former patient of Dr. Lenna Gilford   Subjective:   PATIENT ID: [name] GENDER: female DOB: Jan 10, 1941, MRN: [id number]  Chief Complaint  Patient presents with  . Follow-up    PFT today. Recently seen in ED. States she had to get a blood transfusion. No new concerns. States her breathing has been at her baseline since ED visit.     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  She is followed with Dr. Earlie Server and Dr. Tammi Klippel.  In the oncology services.  She has an appointment with him tomorrow.  She had a recent CT scan completed yesterday which reveals right lower lobe groundglass opacities and scarring after stereotactic radiation to the right lower lobe.  She also has a small 7 mm central nodular location that will need to be followed.  She had PFTs completed today prior to her office visit.  The pulmonary function test revealed moderate COPD with an FEV1 65% postbronchodilator reduced ratio of 66, significant bronchodilator response with greater than 12% improvement, as well as a reduced DLCO 55% predicted.  Patient states today that she quit using her Anora inhaler several months ago because she did not know if it was making much difference in her breathing symptoms.  Before that she liked being on it.  There was some confusion regarding stopping blood pressure medication Azor versus stopping her Anoro.  She may have misunderstood that that information regarding changing of medications.  However she does have a stock supply of an oral at home that she could restart.  She did think it made a little bit of difference but was not sure.  At baseline she is dyspneic on exertion but she does not have any significant cough or daily sputum production.    Past Medical History:  Diagnosis Date  . 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 Asthma   . 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  . Blood in stool    last week of aug 2018  . Carotid artery occlusion   . Complication of anesthesia   . COPD (chronic obstructive pulmonary disease) ([place])   . Coronary artery disease   . 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 Diverticulitis   . Dyspnea    with exertion  . Gastroesophageal reflux disease    occ  . Glaucoma    right eye  . Headache    tension  . Hiatal hernia   . Hyperlipidemia   . Hypertension   . Iliac artery aneurysm ([place])   . Lung cancer ([place]) dx 2018   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  . Myocardial infarction [facility]) 01/01/2000   Cardiac catheterization  . Peripheral vascular disease ([place])    stents in legs x 2 or 3  . Pneumonia    last time winter 2017 -2018  . PONV (postoperative nausea and vomiting)    occassionally, last colonscopy did ok with anesthesia  . Reflux      Family History  Problem Relation Age of Onset  . Heart disease Mother        Heart Disease before age 56  . Hypertension Mother   . Hyperlipidemia Mother   . Heart attack Mother   . Clotting disorder Mother   .  Heart disease Father        Heart Disease before age 21  . Heart attack Father   . Hyperlipidemia Father   . Hypertension Father   . Heart disease Brother        Heart Disease before age 85  . Hyperlipidemia Brother   . Hypertension Brother   . Clotting disorder Brother   . AAA (abdominal aortic aneurysm) Brother   . Cerebral aneurysm Sister   . Hypertension Sister   . AAA (abdominal aortic aneurysm) Sister   . Asthma Sister   . Cerebral aneurysm Brother   . Cancer Brother        Lung  . Hypertension Brother   . Heart attack Brother   . Heart disease Brother        Aneurysm of Brain  . Hypertension Brother   . Heart disease Brother   . Heart disease Brother   . Stroke Son        Aneurysm of Stomach  .  AAA (abdominal aortic aneurysm) Son   . Cancer Maternal Uncle        great uncle/cancer/type unknown     Past Surgical History:  Procedure Laterality Date  . ABDOMINAL HYSTERECTOMY    . APPENDECTOMY    . Arnold-chiari malformation repair  1998   Suboccipital craniectomy  . CAROTID ENDARTERECTOMY  03/29/2010   Left  CEA  . CHOLECYSTECTOMY     Gall Bladder  . COLONOSCOPY WITH PROPOFOL N/A 04/22/2015   Procedure: COLONOSCOPY WITH PROPOFOL;  Surgeon: Carol Ada, MD;  Location: [facility];  Service: Endoscopy;  Laterality: N/A;  . COLONOSCOPY WITH PROPOFOL N/A 05/25/2016   Procedure: COLONOSCOPY WITH PROPOFOL;  Surgeon: Carol Ada, MD;  Location: [facility];  Service: Endoscopy;  Laterality: N/A;  . COLONOSCOPY WITH PROPOFOL N/A 05/03/2017   Procedure: COLONOSCOPY WITH PROPOFOL;  Surgeon: Carol Ada, MD;  Location: [facility];  Service: Endoscopy;  Laterality: N/A;  . CORNEAL TRANSPLANT     Right  . CORONARY ARTERY BYPASS GRAFT  01/01/2000   x 3  . ENTEROSCOPY N/A 02/27/2018   Procedure: ENTEROSCOPY;  Surgeon: Carol Ada, MD;  Location: [facility];  Service: Endoscopy;  Laterality: N/A;  . ENTEROSCOPY N/A 07/18/2018   Procedure: ENTEROSCOPY;  Surgeon: Carol Ada, MD;  Location: [facility];  Service: Endoscopy;  Laterality: N/A;  . ESOPHAGOGASTRODUODENOSCOPY N/A 05/25/2016   Procedure: ESOPHAGOGASTRODUODENOSCOPY (EGD);  Surgeon: Carol Ada, MD;  Location: [facility];  Service: Endoscopy;  Laterality: N/A;  . ESOPHAGOGASTRODUODENOSCOPY N/A 08/01/2018   Procedure: ESOPHAGOGASTRODUODENOSCOPY (EGD);  Surgeon: Carol Ada, MD;  Location: [facility];  Service: Endoscopy;  Laterality: N/A;  . EYE SURGERY Right 1995 or 1996   Laser surgery for retinal hemorrhage  . GIVENS CAPSULE STUDY N/A 07/16/2018   Procedure: GIVENS CAPSULE STUDY;  Surgeon: Carol Ada, MD;  Location: [facility];  Service: Endoscopy;  Laterality: N/A;  . HOT HEMOSTASIS N/A 02/27/2018   Procedure:  HOT HEMOSTASIS (ARGON PLASMA COAGULATION/BICAP);  Surgeon: Carol Ada, MD;  Location: [facility];  Service: Endoscopy;  Laterality: N/A;  . HOT HEMOSTASIS N/A 08/01/2018   Procedure: HOT HEMOSTASIS (ARGON PLASMA COAGULATION/BICAP);  Surgeon: Carol Ada, MD;  Location: [facility];  Service: Endoscopy;  Laterality: N/A;  . IR RADIOLOGIST EVAL & MGMT  12/14/2016  . LEFT HEART CATH AND CORS/GRAFTS ANGIOGRAPHY N/A 01/09/2018   Procedure: LEFT HEART CATH AND CORS/GRAFTS ANGIOGRAPHY;  Surgeon: Charolette Forward, MD;  Location: [place];  Service: Cardiovascular;  Laterality: N/A;  .  LEFT HEART CATHETERIZATION WITH CORONARY ANGIOGRAM N/A 08/03/2014   Procedure: LEFT HEART CATHETERIZATION WITH CORONARY ANGIOGRAM;  Surgeon: Birdie Riddle, MD;  Location: [facility];  Service: Cardiovascular;  Laterality: N/A;  . Post Coronary Artery  BPG  01/05/2000   Right jugular sheath removed  . PR VEIN BYPASS GRAFT,AORTO-FEM-POP    . ROTATOR CUFF REPAIR     Right    Social History   Socioeconomic History  . Marital status: Widowed    Spouse name: Not on file  . Number of children: 4  . Years of education: 7TH  . Highest education level: Not on file  Occupational History  . Not on file  Social Needs  . Financial resource strain: Not on file  . Food insecurity:    Worry: Not on file    Inability: Not on file  . Transportation needs:    Medical: Not on file    Non-medical: Not on file  Tobacco Use  . Smoking status: Former Smoker    Packs/day: 1.50    Years: 30.00    Pack years: 45.00    Types: Cigarettes    Last attempt to quit: 08/13/2000    Years since quitting: 18.0  . Smokeless tobacco: Never Used  Substance and Sexual Activity  . Alcohol use: No    Alcohol/week: 0.0 standard drinks  . Drug use: No  . Sexual activity: Never  Lifestyle  . Physical activity:    Days per week: Not on file    Minutes per session: Not on file  . Stress: Not on file  Relationships  . Social  connections:    Talks on phone: Not on file    Gets together: Not on file    Attends religious service: Not on file    Active member of club or organization: Not on file    Attends meetings of clubs or organizations: Not on file    Relationship status: Not on file  . Intimate partner violence:    Fear of current or ex partner: Not on file    Emotionally abused: Not on file    Physically abused: Not on file    Forced sexual activity: Not on file  Other Topics Concern  . Not on file  Social History Narrative   Lives at home w/ her son   Right-handed   Drinks coffee and [employer] daily     Allergies  Allergen Reactions  . Hydromorphone Palpitations and Other (See Comments)    DILAUDID  -  Pt had a Heart Attack after taking Dilaudid.  . Levaquin [Levofloxacin] Other (See Comments)    Chest pressure, SOB, "pain in between shoulder blades", sweaty -as reported by patient per experience in ED this afternoon  . Azithromycin Other (See Comments)    Patient reported past history of lip swelling  . Codeine Other (See Comments)    Dr. Terrence Dupont advised patient not to take this medication  . Doxycycline Swelling and Other (See Comments)    Mouth, lips, feet swelling  . Oxycodone-Acetaminophen Other (See Comments)    Says it makes her feel weird  . Risedronate Other (See Comments)    Chest pain  . Avelox [Moxifloxacin Hcl In Nacl] Palpitations     Outpatient Medications Prior to Visit  Medication Sig Dispense Refill  . acetaminophen (TYLENOL) 325 MG tablet Take 650 mg by mouth every 6 (six) hours as needed for moderate pain or headache.     . clopidogrel (PLAVIX) 75 MG tablet  Take 1 tablet (75 mg total) by mouth daily. 90 tablet 3  . dexlansoprazole (DEXILANT) 60 MG capsule Take 1 capsule (60 mg total) by mouth daily before breakfast. 30 capsule 11  . gabapentin (NEURONTIN) 100 MG capsule Take 1 capsule (100 mg total) by mouth 3 (three) times daily. 90 capsule 0  . nitroGLYCERIN (NITROSTAT)  0.4 MG SL tablet Place 0.4 mg under the tongue every 5 (five) minutes as needed for chest pain.     . polyethylene glycol (MIRALAX / GLYCOLAX) packet Take 17 g by mouth daily. 14 each 0  . prednisoLONE acetate (PRED FORTE) 1 % ophthalmic suspension Place 1 drop into the right eye every other day.     . rosuvastatin (CRESTOR) 10 MG tablet Take 10 mg by mouth at bedtime.     Marland Kitchen spironolactone (ALDACTONE) 25 MG tablet Take 25 mg by mouth daily.    Marland Kitchen amLODipine-olmesartan (AZOR) 5-40 MG tablet Take 1 tablet by mouth daily.    . ferrous sulfate 325 (65 FE) MG tablet Take 1 tablet (325 mg total) by mouth daily with breakfast. 30 tablet 0  . aspirin 81 MG chewable tablet Chew 81 mg by mouth daily.     Facility-Administered Medications Prior to Visit  Medication Dose Route Frequency Provider Last Rate Last Dose  . promethazine (PHENERGAN) injection 6.25-12.5 mg  6.25-12.5 mg Intravenous Q15 min PRN Brennan Bailey, MD        Review of Systems  Constitutional: Negative for chills, fever, malaise/fatigue and weight loss.  HENT: Negative for hearing loss, sore throat and tinnitus.   Eyes: Negative for blurred vision and double vision.  Respiratory: Positive for shortness of breath. Negative for cough, hemoptysis, sputum production, wheezing and stridor.   Cardiovascular: Negative for chest pain, palpitations, orthopnea, leg swelling and PND.  Gastrointestinal: Negative for abdominal pain, constipation, diarrhea, heartburn, nausea and vomiting.  Genitourinary: Negative for dysuria, hematuria and urgency.  Musculoskeletal: Negative for joint pain and myalgias.  Skin: Negative for itching and rash.  Neurological: Negative for dizziness, tingling, weakness and headaches.  Endo/Heme/Allergies: Negative for environmental allergies. Does not bruise/bleed easily.  Psychiatric/Behavioral: Negative for depression. The patient is not nervous/anxious and does not have insomnia.   All other systems reviewed and  are negative.    Objective:  Physical Exam Vitals signs reviewed.  Constitutional:      General: She is not in acute distress.    Appearance: She is well-developed.  HENT:     Head: Normocephalic and atraumatic.  Eyes:     General: No scleral icterus.    Conjunctiva/sclera: Conjunctivae normal.     Pupils: Pupils are equal, round, and reactive to light.  Neck:     Musculoskeletal: Neck supple.     Vascular: No JVD.     Trachea: No tracheal deviation.  Cardiovascular:     Rate and Rhythm: Normal rate and regular rhythm.     Heart sounds: Normal heart sounds. No murmur.  Pulmonary:     Effort: Pulmonary effort is normal. No tachypnea, accessory muscle usage or respiratory distress.     Breath sounds: No stridor. No wheezing, rhonchi or rales.     Comments: Diminished breath sounds bilaterally Abdominal:     General: Bowel sounds are normal. There is no distension.     Palpations: Abdomen is soft.     Tenderness: There is no abdominal tenderness.  Musculoskeletal:        General: No tenderness.  Lymphadenopathy:  Cervical: No cervical adenopathy.  Skin:    General: Skin is warm and dry.     Capillary Refill: Capillary refill takes less than 2 seconds.     Findings: No rash.  Neurological:     Mental Status: She is alert and oriented to person, place, and time.  Psychiatric:        Behavior: Behavior normal.      Vitals:   08/26/18 1349  BP: 124/72  Pulse: 73  SpO2: 100%  Weight: 157 lb (71.2 kg)  Height: 5\' 2"  (1.575 m)   100% on RA BMI Readings from Last 3 Encounters:  08/26/18 28.72 kg/m  08/20/18 29.08 kg/m  08/01/18 29.08 kg/m   Wt Readings from Last 3 Encounters:  08/26/18 157 lb (71.2 kg)  08/20/18 159 lb (72.1 kg)  08/01/18 159 lb (72.1 kg)     CBC    Component Value Date/Time   WBC 6.5 08/25/2018 0805   WBC 7.6 07/18/2018 0516   RBC 3.69 (L) 08/25/2018 0805   HGB 10.4 (L) 08/25/2018 0805   HGB 9.9 (L) 08/02/2017 0821   HCT 33.1  (L) 08/25/2018 0805   HCT 29.8 (L) 08/02/2017 0821   PLT 276 08/25/2018 0805   PLT 218 08/02/2017 0821   MCV 89.7 08/25/2018 0805   MCV 92.3 08/02/2017 0821   MCH 28.2 08/25/2018 0805   MCHC 31.4 08/25/2018 0805   RDW 13.8 08/25/2018 0805   RDW 14.4 08/02/2017 0821   LYMPHSABS 2.0 08/25/2018 0805   LYMPHSABS 2.6 08/02/2017 0821   MONOABS 0.6 08/25/2018 0805   MONOABS 0.5 08/02/2017 0821   EOSABS 0.3 08/25/2018 0805   EOSABS 0.2 08/02/2017 0821   BASOSABS 0.0 08/25/2018 0805   BASOSABS 0.0 08/02/2017 0821    Chest Imaging:  08/25/2018 CT Chest:  Imaging reviewed.  Right lower lobe areas of groundglass and scarring status post Coralyn Mark tactic radiation likely radiation-induced parenchymal scarring.  Small nodular density within this area 7 mm in size.  She also has a new associated/adjacent rib fracture within that right side which may be concerning and would need to be followed for any recurrence of squamous cell disease. The patient's images have been independently reviewed by me.     Pulmonary Functions Testing Results: PFT Results Latest Ref Rng & Units 08/26/2018 02/14/2018 04/10/2016  FVC-Pre L 1.69 1.80 1.53  FVC-Predicted Pre % 65 69 57  FVC-Post L 1.91 1.99 1.82  FVC-Predicted Post % 74 77 69  Pre FEV1/FVC % % 67 72 71  Post FEV1/FCV % % 66 72 70  FEV1-Pre L 1.12 1.30 1.09  FEV1-Predicted Pre % 58 67 54  FEV1-Post L 1.26 1.43 1.28  DLCO UNC% % 55 - 51  DLCO COR %Predicted % 97 - 86  TLC L 3.85 - 3.77  TLC % Predicted % 81 - 79  RV % Predicted % 94 - 96     FeNO: None   Pathology:   Squamous cell carcinoma of the lung right lower lobe status post stereotactic radiation therapy  Echocardiogram: none recent   Heart Catheterization:   Jan 09, 2018: Dr. Nelta Numbers LAD to Prox LAD lesion is 80% stenosed.  Ost 1st Diag lesion is 100% stenosed.  Prox RCA lesion is 20% stenosed.  Mid RCA lesion is 30% stenosed.  Mid LM to Dist LM lesion is 40%  stenosed.  Ost Ramus lesion is 40% stenosed.  Ost Cx lesion is 40% stenosed.  Origin lesion is 100% stenosed.  Origin to Prox Graft lesion is 100% stenosed.  The left ventricular systolic function is normal.  LV end diastolic pressure is normal.  The left ventricular ejection fraction is 50-55% by visual estimate.    Assessment & Plan:   Stage 2 moderate COPD by GOLD classification (HCC)  SOB (shortness of breath)  Hx of cancer of lung  Primary cancer of right lower lobe of lung (HCC)  Hx of radiation therapy  Discussion:  This is a 78 year old female with a past medical history of stage II COPD PFTs completed today in the office which were reviewed with the patient at bedside.  Currently managed on Anoro Ellipta.  History of stage I squamous cell carcinoma of the right lower lobe status post stereotactic radiation therapy.  Now with area of radiation scarring and a small nodule within this right lower lobe.  She also has an adjacent defect within the cortex of a rib.  Concerning for a rib fracture.  She has no recent trauma.  Due to her history of squamous cell one must consider recurrence of disease there however there is no obvious masslike lesion visible.  She may have bone disease due to radiation in that area as well.  She does have follow-up with her oncologist which she will likely discuss this with him tomorrow.  As for the small nodular lesion within the area I do suggest that she needs routine follow-up with that.  Will need at minimum 53-month follow-up of this nodule.  We will be happy to order this or allow oncology to continue to follow.  I suspect oncology will continue to follow with 70-month CT image.  As for the defect in the rib I am not sure if we need to continue following this with imaging or if there would be appropriateness of a PET scan to see if there is any metabolic uptake.  There could be metabolic uptake within the scarring postradiation which may yield a  false positive result.  Patient should continue on her [place].  She does not need refills at this time.  Patient was counseled on use of albuterol inhaler for shortness of breath and wheezing as needed.  Patient to return to our office in 6 months or as needed.   Greater than 50% of this patient's 45-minute office was spent face-to-face discussing the recommendations and treatment plan.     Current Outpatient Medications:  .  acetaminophen (TYLENOL) 325 MG tablet, Take 650 mg by mouth every 6 (six) hours as needed for moderate pain or headache. , Disp: , Rfl:  .  clopidogrel (PLAVIX) 75 MG tablet, Take 1 tablet (75 mg total) by mouth daily., Disp: 90 tablet, Rfl: 3 .  dexlansoprazole (DEXILANT) 60 MG capsule, Take 1 capsule (60 mg total) by mouth daily before breakfast., Disp: 30 capsule, Rfl: 11 .  gabapentin (NEURONTIN) 100 MG capsule, Take 1 capsule (100 mg total) by mouth 3 (three) times daily., Disp: 90 capsule, Rfl: 0 .  nitroGLYCERIN (NITROSTAT) 0.4 MG SL tablet, Place 0.4 mg under the tongue every 5 (five) minutes as needed for chest pain. , Disp: , Rfl:  .  polyethylene glycol (MIRALAX / GLYCOLAX) packet, Take 17 g by mouth daily., Disp: 14 each, Rfl: 0 .  prednisoLONE acetate (PRED FORTE) 1 % ophthalmic suspension, Place 1 drop into the right eye every other day. , Disp: , Rfl:  .  rosuvastatin (CRESTOR) 10 MG tablet, Take 10 mg by mouth at bedtime. , Disp: , Rfl:  .  spironolactone (ALDACTONE) 25 MG tablet, Take 25 mg by mouth daily., Disp: , Rfl:  .  amLODipine-olmesartan (AZOR) 5-40 MG tablet, Take 1 tablet by mouth daily., Disp: , Rfl:  .  ferrous sulfate 325 (65 FE) MG tablet, Take 1 tablet (325 mg total) by mouth daily with breakfast., Disp: 30 tablet, Rfl: 0 No current facility-administered medications for this visit.   Facility-Administered Medications Ordered in Other Visits:  .  promethazine (PHENERGAN) injection 6.25-12.5 mg, 6.25-12.5 mg, Intravenous, Q15 min  PRN, Daiva Huge, Leanord Hawking, MD   Garner Nash, DO [facility] 08/26/2018 2:02 PM

## 2018-08-27 ENCOUNTER — Other Ambulatory Visit: Payer: Self-pay

## 2018-08-27 ENCOUNTER — Telehealth: Payer: Self-pay

## 2018-08-27 ENCOUNTER — Encounter: Payer: Self-pay | Admitting: Internal Medicine

## 2018-08-27 ENCOUNTER — Inpatient Hospital Stay (HOSPITAL_BASED_OUTPATIENT_CLINIC_OR_DEPARTMENT_OTHER): Payer: Medicare Other | Admitting: Internal Medicine

## 2018-08-27 VITALS — BP 162/60 | HR 65 | Temp 98.0°F | Resp 17 | Ht 62.0 in | Wt 163.8 lb

## 2018-08-27 DIAGNOSIS — Q0700 Arnold-Chiari syndrome without spina bifida or hydrocephalus: Secondary | ICD-10-CM

## 2018-08-27 DIAGNOSIS — I2510 Atherosclerotic heart disease of native coronary artery without angina pectoris: Secondary | ICD-10-CM

## 2018-08-27 DIAGNOSIS — K219 Gastro-esophageal reflux disease without esophagitis: Secondary | ICD-10-CM

## 2018-08-27 DIAGNOSIS — I6521 Occlusion and stenosis of right carotid artery: Secondary | ICD-10-CM

## 2018-08-27 DIAGNOSIS — E785 Hyperlipidemia, unspecified: Secondary | ICD-10-CM

## 2018-08-27 DIAGNOSIS — Z79899 Other long term (current) drug therapy: Secondary | ICD-10-CM

## 2018-08-27 DIAGNOSIS — I252 Old myocardial infarction: Secondary | ICD-10-CM

## 2018-08-27 DIAGNOSIS — K922 Gastrointestinal hemorrhage, unspecified: Secondary | ICD-10-CM

## 2018-08-27 DIAGNOSIS — J449 Chronic obstructive pulmonary disease, unspecified: Secondary | ICD-10-CM

## 2018-08-27 DIAGNOSIS — D509 Iron deficiency anemia, unspecified: Secondary | ICD-10-CM | POA: Diagnosis not present

## 2018-08-27 DIAGNOSIS — D638 Anemia in other chronic diseases classified elsewhere: Secondary | ICD-10-CM

## 2018-08-27 DIAGNOSIS — C3431 Malignant neoplasm of lower lobe, right bronchus or lung: Secondary | ICD-10-CM

## 2018-08-27 DIAGNOSIS — Z951 Presence of aortocoronary bypass graft: Secondary | ICD-10-CM

## 2018-08-27 DIAGNOSIS — I10 Essential (primary) hypertension: Secondary | ICD-10-CM

## 2018-08-27 DIAGNOSIS — D500 Iron deficiency anemia secondary to blood loss (chronic): Secondary | ICD-10-CM

## 2018-08-27 DIAGNOSIS — C3490 Malignant neoplasm of unspecified part of unspecified bronchus or lung: Secondary | ICD-10-CM

## 2018-08-27 DIAGNOSIS — H409 Unspecified glaucoma: Secondary | ICD-10-CM

## 2018-08-27 NOTE — Telephone Encounter (Signed)
Printed avs and calender of upcoming per 1/15 los

## 2018-08-27 NOTE — Progress Notes (Signed)
[facility] [phone]) [phone]   [phone]) [phone]  OFFICE PROGRESS NOTE  [name], MD [address] [place] [address]  DIAGNOSIS:  1) stage IA non-small cell lung cancer, squamous cell carcinoma presented with right lower lobe pulmonary nodule. 2) persistent anemia questionable for anemia of chronic disease plus/minus iron deficiency.  PRIOR THERAPY:  1) Feraheme infusion last dose was given 06/20/2016. 2) stereotactic radiotherapy to the right lower lobe lung nodule under the care of Dr. Tammi Klippel.  CURRENT THERAPY: Observation.  INTERVAL HISTORY: [name] 78 y.o. female returns to the clinic today for follow-up visit accompanied by her daughter.  The patient is feeling fine today with no concerning complaints.  She had a lot of bleeding recently and the patient underwent colonoscopy and capsule endoscopy.  She received 2 units of PRBCs transfusion at that time.  She was also started on oral iron tablets.  She denied having any current chest pain but continues to have shortness of breath with exertion with no cough or hemoptysis.  She denied having any fever or chills.  She has no nausea, vomiting, diarrhea or constipation.  She had repeat CT scan of the chest performed recently and she is here for evaluation and discussion of her scan results.  MEDICAL HISTORY: Past Medical History:  Diagnosis Date  . 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 Asthma   . 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  . Blood in stool    last week of aug 2018  . Carotid artery occlusion   . Complication of anesthesia   . COPD (chronic obstructive pulmonary disease) ([place])   . Coronary artery disease   . 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 Diverticulitis   . Dyspnea    with exertion  . Gastroesophageal reflux disease    occ  . Glaucoma    right eye  .  Headache    tension  . Hiatal hernia   . Hyperlipidemia   . Hypertension   . Iliac artery aneurysm ([place])   . Lung cancer ([place]) dx 2018   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  . Myocardial infarction [facility]) 01/01/2000   Cardiac catheterization  . Peripheral vascular disease ([place])    stents in legs x 2 or 3  . Pneumonia    last time winter 2017 -2018  . PONV (postoperative nausea and vomiting)    occassionally, last colonscopy did ok with anesthesia  . Reflux     ALLERGIES:  is allergic to hydromorphone; levaquin [levofloxacin]; azithromycin; codeine; doxycycline; oxycodone-acetaminophen; risedronate; and avelox [moxifloxacin hcl in nacl].  MEDICATIONS:  Current Outpatient Medications  Medication Sig Dispense Refill  . acetaminophen (TYLENOL) 325 MG tablet Take 650 mg by mouth every 6 (six) hours as needed for moderate pain or headache.     Marland Kitchen amLODipine-olmesartan (AZOR) 5-40 MG tablet Take 1 tablet by mouth daily.    . clopidogrel (PLAVIX) 75 MG tablet Take 1 tablet (75 mg total) by mouth daily. 90 tablet 3  . dexlansoprazole (DEXILANT) 60 MG capsule Take 1 capsule (60 mg total) by mouth daily before breakfast. 30 capsule 11  . gabapentin (NEURONTIN) 100 MG capsule Take 1 capsule (100 mg total) by mouth 3 (three) times daily. 90 capsule 0  . nitroGLYCERIN (NITROSTAT) 0.4 MG SL tablet Place 0.4 mg under the tongue every 5 (five) minutes as  needed for chest pain.     . polyethylene glycol (MIRALAX / GLYCOLAX) packet Take 17 g by mouth daily. 14 each 0  . prednisoLONE acetate (PRED FORTE) 1 % ophthalmic suspension Place 1 drop into the right eye every other day.     . rosuvastatin (CRESTOR) 10 MG tablet Take 10 mg by mouth at bedtime.     Marland Kitchen spironolactone (ALDACTONE) 25 MG tablet Take 25 mg by mouth daily.    . ferrous sulfate 325 (65 FE) MG tablet Take 1 tablet (325 mg total) by mouth daily with breakfast. 30 tablet 0   No current facility-administered medications for this  visit.    Facility-Administered Medications Ordered in Other Visits  Medication Dose Route Frequency Provider Last Rate Last Dose  . promethazine (PHENERGAN) injection 6.25-12.5 mg  6.25-12.5 mg Intravenous Q15 min PRN Brennan Bailey, MD        SURGICAL HISTORY:  Past Surgical History:  Procedure Laterality Date  . ABDOMINAL HYSTERECTOMY    . APPENDECTOMY    . Arnold-chiari malformation repair  1998   Suboccipital craniectomy  . CAROTID ENDARTERECTOMY  03/29/2010   Left  CEA  . CHOLECYSTECTOMY     Gall Bladder  . COLONOSCOPY WITH PROPOFOL N/A 04/22/2015   Procedure: COLONOSCOPY WITH PROPOFOL;  Surgeon: Carol Ada, MD;  Location: [facility];  Service: Endoscopy;  Laterality: N/A;  . COLONOSCOPY WITH PROPOFOL N/A 05/25/2016   Procedure: COLONOSCOPY WITH PROPOFOL;  Surgeon: Carol Ada, MD;  Location: [facility];  Service: Endoscopy;  Laterality: N/A;  . COLONOSCOPY WITH PROPOFOL N/A 05/03/2017   Procedure: COLONOSCOPY WITH PROPOFOL;  Surgeon: Carol Ada, MD;  Location: [facility];  Service: Endoscopy;  Laterality: N/A;  . CORNEAL TRANSPLANT     Right  . CORONARY ARTERY BYPASS GRAFT  01/01/2000   x 3  . ENTEROSCOPY N/A 02/27/2018   Procedure: ENTEROSCOPY;  Surgeon: Carol Ada, MD;  Location: [facility];  Service: Endoscopy;  Laterality: N/A;  . ENTEROSCOPY N/A 07/18/2018   Procedure: ENTEROSCOPY;  Surgeon: Carol Ada, MD;  Location: [facility];  Service: Endoscopy;  Laterality: N/A;  . ESOPHAGOGASTRODUODENOSCOPY N/A 05/25/2016   Procedure: ESOPHAGOGASTRODUODENOSCOPY (EGD);  Surgeon: Carol Ada, MD;  Location: [facility];  Service: Endoscopy;  Laterality: N/A;  . ESOPHAGOGASTRODUODENOSCOPY N/A 08/01/2018   Procedure: ESOPHAGOGASTRODUODENOSCOPY (EGD);  Surgeon: Carol Ada, MD;  Location: [facility];  Service: Endoscopy;  Laterality: N/A;  . EYE SURGERY Right 1995 or 1996   Laser surgery for retinal hemorrhage  . GIVENS CAPSULE STUDY N/A 07/16/2018    Procedure: GIVENS CAPSULE STUDY;  Surgeon: Carol Ada, MD;  Location: [facility];  Service: Endoscopy;  Laterality: N/A;  . HOT HEMOSTASIS N/A 02/27/2018   Procedure: HOT HEMOSTASIS (ARGON PLASMA COAGULATION/BICAP);  Surgeon: Carol Ada, MD;  Location: [facility];  Service: Endoscopy;  Laterality: N/A;  . HOT HEMOSTASIS N/A 08/01/2018   Procedure: HOT HEMOSTASIS (ARGON PLASMA COAGULATION/BICAP);  Surgeon: Carol Ada, MD;  Location: [facility];  Service: Endoscopy;  Laterality: N/A;  . IR RADIOLOGIST EVAL & MGMT  12/14/2016  . LEFT HEART CATH AND CORS/GRAFTS ANGIOGRAPHY N/A 01/09/2018   Procedure: LEFT HEART CATH AND CORS/GRAFTS ANGIOGRAPHY;  Surgeon: Charolette Forward, MD;  Location: [facility];  Service: Cardiovascular;  Laterality: N/A;  . LEFT HEART CATHETERIZATION WITH CORONARY ANGIOGRAM N/A 08/03/2014   Procedure: LEFT HEART CATHETERIZATION WITH CORONARY ANGIOGRAM;  Surgeon: Birdie Riddle, MD;  Location: [facility];  Service: Cardiovascular;  Laterality: N/A;  . Post Coronary Artery  BPG  01/05/2000   Right jugular sheath removed  . PR VEIN BYPASS GRAFT,AORTO-FEM-POP    . ROTATOR CUFF REPAIR     Right    REVIEW OF SYSTEMS:  A comprehensive review of systems was negative except for: Constitutional: positive for fatigue Respiratory: positive for cough and dyspnea on exertion   PHYSICAL EXAMINATION: General appearance: alert, cooperative, fatigued and no distress Head: Normocephalic, without obvious abnormality, atraumatic Neck: no adenopathy, no JVD, supple, symmetrical, trachea midline and thyroid not enlarged, symmetric, no tenderness/mass/nodules Lymph nodes: Cervical, supraclavicular, and axillary nodes normal. Resp: clear to auscultation bilaterally Back: symmetric, no curvature. ROM normal. No CVA tenderness. Cardio: regular rate and rhythm, S1, S2 normal, no murmur, click, rub or gallop GI: soft, non-tender; bowel sounds normal; no masses,  no  organomegaly Extremities: extremities normal, atraumatic, no cyanosis or edema  ECOG PERFORMANCE STATUS: 1 - Symptomatic but completely ambulatory  Blood pressure (!) 162/60, pulse 65, temperature 98 F (36.7 C), temperature source Oral, resp. rate 17, height 5\' 2"  (1.575 m), weight 163 lb 12.8 oz (74.3 kg), SpO2 100 %.  LABORATORY DATA: Lab Results  Component Value Date   WBC 6.5 08/25/2018   HGB 10.4 (L) 08/25/2018   HCT 33.1 (L) 08/25/2018   MCV 89.7 08/25/2018   PLT 276 08/25/2018      Chemistry      Component Value Date/Time   NA 140 08/25/2018 0805   NA 139 08/02/2017 0821   K 5.2 (H) 08/25/2018 0805   K 4.3 08/02/2017 0821   CL 107 08/25/2018 0805   CO2 25 08/25/2018 0805   CO2 24 08/02/2017 0821   BUN 17 08/25/2018 0805   BUN 14.4 08/02/2017 0821   CREATININE 1.47 (H) 08/25/2018 0805   CREATININE 1.3 (H) 08/02/2017 0821      Component Value Date/Time   CALCIUM 9.9 08/25/2018 0805   CALCIUM 9.2 08/02/2017 0821   ALKPHOS 70 08/25/2018 0805   ALKPHOS 66 08/02/2017 0821   AST 17 08/25/2018 0805   AST 14 08/02/2017 0821   ALT 10 08/25/2018 0805   ALT 8 08/02/2017 0821   BILITOT <0.2 (L) 08/25/2018 0805   BILITOT 0.34 08/02/2017 0821       RADIOGRAPHIC STUDIES: Ct Chest Wo Contrast  Result Date: 08/25/2018 CLINICAL DATA:  Lung cancer diagnosed in 2018 with radiation therapy complete. Chronic shortness of breath. EXAM: CT CHEST WITHOUT CONTRAST TECHNIQUE: Multidetector CT imaging of the chest was performed following the standard protocol without IV contrast. COMPARISON:  05/04/2018 and 02/10/2018. FINDINGS: Cardiovascular: Aortic and branch vessel atherosclerosis. Normal heart size, without pericardial effusion. Median sternotomy for CABG. Mediastinum/Nodes: No mediastinal or definite hilar adenopathy, given limitations of unenhanced CT. Lungs/Pleura: No pleural fluid. Biapical pleuroparenchymal scarring. Minimal motion degradation inferiorly. Inferior right upper  lobe 7 x 10 mm calcified granuloma is not significantly changed. Again identified is right lower lobe ground-glass opacity and architectural distortion which is presumably radiation induced. Surrounding interstitial thickening is similar medially but slightly increased laterally, including on image 78/5. Central nodularity, including at 6 mm on image 77/5, similar. Minimal nodularity along the right minor fissure is similar on image 61/5. Upper Abdomen: Cholecystectomy. High left hepatic lobe subcentimeter cyst. Normal imaged portions of the spleen, stomach, pancreas, adrenal glands, kidneys. Musculoskeletal: Posterolateral right seventh rib fracture is new, including on image 71/2. No underlying lesion in this area on the prior exam. Mild thoracic spondylosis. IMPRESSION: 1. Redemonstration of presumed radiation change in the right lower lobe with underlying  similar nodularity. Increased interstitial thickening laterally could relate to evolving radiation change or hypoventilation in the setting of interval seventh right rib fracture. Recommend attention on follow-up. 2. No findings of metastatic disease or new lung lesion. 3. Aortic atherosclerosis (ICD10-I70.0) and emphysema (ICD10-J43.9). 4. Right seventh rib fracture is new in the interval and favored to be posttraumatic or related to osteonecrosis. Electronically Signed   By: Abigail Miyamoto M.D.   On: 08/25/2018 10:24   Vas US Carotid  Result Date: 08/20/2018 Carotid Arterial Duplex Study Indications:   Carotid artery disease and Right carotid artery stenosis. Risk Factors:  Hypertension, hyperlipidemia. Other Factors: Left carotid endarterectomy 03/29/2010. Performing Technologist: Ronal Fear RVS, RCS  Examination Guidelines: A complete evaluation includes B-mode imaging, spectral Doppler, color Doppler, and power Doppler as needed of all accessible portions of each vessel. Bilateral testing is considered an integral part of a complete examination.  Limited examinations for reoccurring indications may be performed as noted.  Right Carotid Findings: +----------+--------+--------+--------+------------+--------+           PSV cm/sEDV cm/sStenosisDescribe    Comments +----------+--------+--------+--------+------------+--------+ CCA Prox  111     10                                   +----------+--------+--------+--------+------------+--------+ CCA Mid   68      12                                   +----------+--------+--------+--------+------------+--------+ CCA Distal62      11                                   +----------+--------+--------+--------+------------+--------+ ICA Prox  181     41      40-59%  heterogenous         +----------+--------+--------+--------+------------+--------+ ICA Mid   148     28                                   +----------+--------+--------+--------+------------+--------+ ICA Distal130     34                                   +----------+--------+--------+--------+------------+--------+ ECA       120                                          +----------+--------+--------+--------+------------+--------+ +----------+--------+-------+----------------+-------------------+           PSV cm/sEDV cmsDescribe        Arm Pressure (mmHG) +----------+--------+-------+----------------+-------------------+ [id number]            Multiphasic, WNL                    +----------+--------+-------+----------------+-------------------+ +---------+--------+--+--------+--+---------+ VertebralPSV cm/s48EDV cm/s11Antegrade +---------+--------+--+--------+--+---------+  Left Carotid Findings: +----------+--------+--------+--------+--------+------------------+           PSV cm/sEDV cm/sStenosisDescribeComments           +----------+--------+--------+--------+--------+------------------+ CCA Prox  94      16  intimal thickening  +----------+--------+--------+--------+--------+------------------+ CCA Mid   81      18                      intimal thickening +----------+--------+--------+--------+--------+------------------+ CCA Distal77      13                      intimal thickening +----------+--------+--------+--------+--------+------------------+ ICA Prox  77      16      1-39%           intimal thickening +----------+--------+--------+--------+--------+------------------+ ICA Mid   107     30                                         +----------+--------+--------+--------+--------+------------------+ ICA Distal140     31                                         +----------+--------+--------+--------+--------+------------------+ ECA       76                                                 +----------+--------+--------+--------+--------+------------------+ +----------+--------+--------+----------------+-------------------+ SubclavianPSV cm/sEDV cm/sDescribe        Arm Pressure (mmHG) +----------+--------+--------+----------------+-------------------+           203             Multiphasic, WNL                    +----------+--------+--------+----------------+-------------------+ +---------+--------+--+--------+--+---------+ VertebralPSV cm/s72EDV cm/s16Antegrade +---------+--------+--+--------+--+---------+  Summary: Right Carotid: Velocities in the right ICA are consistent with a 40-59%                stenosis. Left Carotid: Velocities in the left ICA are consistent with a 1-39% stenosis. Vertebrals:  Bilateral vertebral arteries demonstrate antegrade flow. Subclavians: Normal flow hemodynamics were seen in bilateral subclavian              arteries. *See table(s) above for measurements and observations.  Electronically signed by Deitra Mayo MD on 08/20/2018 at 11:05:50 AM.    Final     ASSESSMENT AND PLAN:  This is a very pleasant 78 years old white female with a stage IA  non-small cell lung cancer, squamous cell carcinoma presented with right lower lobe pulmonary nodule status post stereotactic radiotherapy.  The patient is currently on observation and she is feeling fine except for the fatigue from iron deficiency anemia as well as shortness of breath with exertion. She had repeat CT scan of the chest performed recently.  I personally and independently reviewed the scan and discussed the result with the patient and her daughter today.  Her scan showed no concerning findings for disease recurrence or progression. I recommended for her to continue on observation with repeat CT scan of the chest in 6 months. For the iron deficiency anemia, she was treated with Feraheme infusion in July 2019.  I also recommend for the patient to continue on oral iron tablets for now.   The patient was advised to call immediately if she has any concerning symptoms in the interval. All questions were answered. The patient knows  to call the clinic with any problems, questions or concerns. We can certainly see the patient much sooner if necessary.  Disclaimer: This note was dictated with voice recognition software. Similar sounding words can inadvertently be transcribed and may not be corrected upon review.

## 2018-10-07 ENCOUNTER — Telehealth: Payer: Self-pay | Admitting: Medical Oncology

## 2018-10-07 NOTE — Telephone Encounter (Signed)
CXR- results-Pt called and said that Dr Coletta Memos told her to f/u with [facility] about scheduling a CT scan sooner than July.  I called for cxr report.

## 2018-10-31 IMAGING — CT CT CHEST W/O CM
2 of 3 series · 15 of 36 positions shown, 18 images · non-contrast
Comparison: 08/22/2017

CLINICAL DATA: Non-small cell right lung cancer.

EXAM:
CT CHEST WITHOUT CONTRAST
TECHNIQUE: Multidetector CT imaging of the chest was performed following the
standard protocol without IV contrast.

[Series 2: thorax · axial · 0.69mm/px · z∈[-429,-197]mm · 12 of 138 slices shown, 15 images]
[im 11/138  mediastinal]
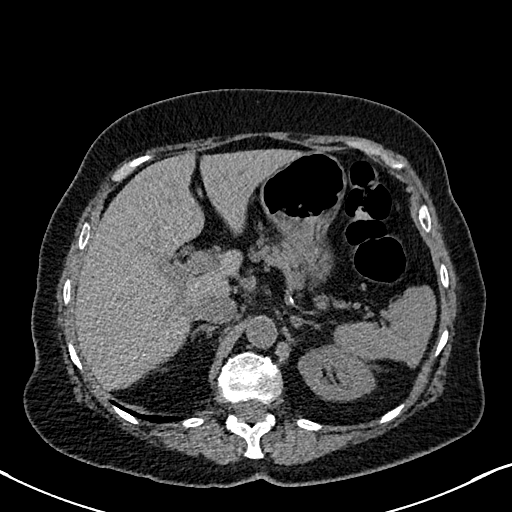
[im 11/138  lung]
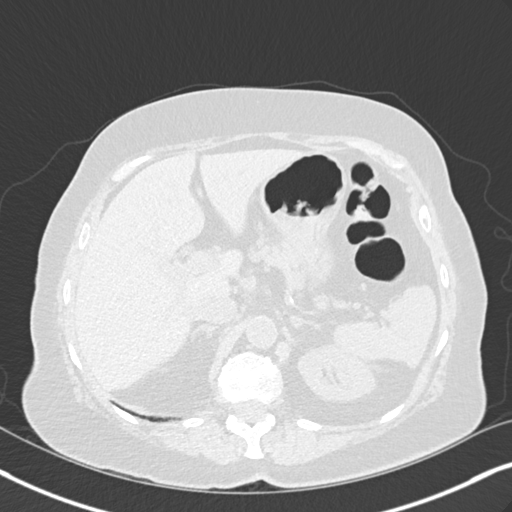
[im 21/138  lung]
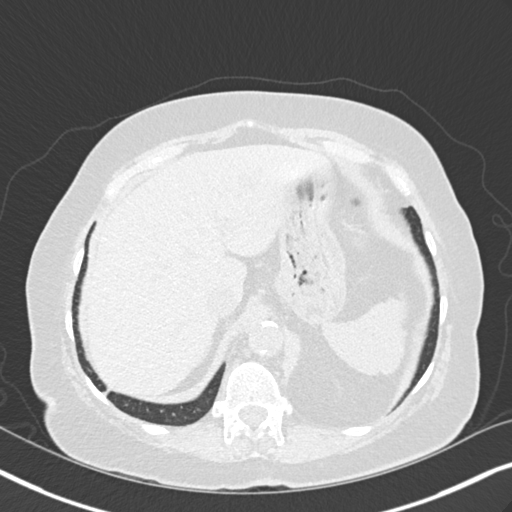
[im 31/138  lung]
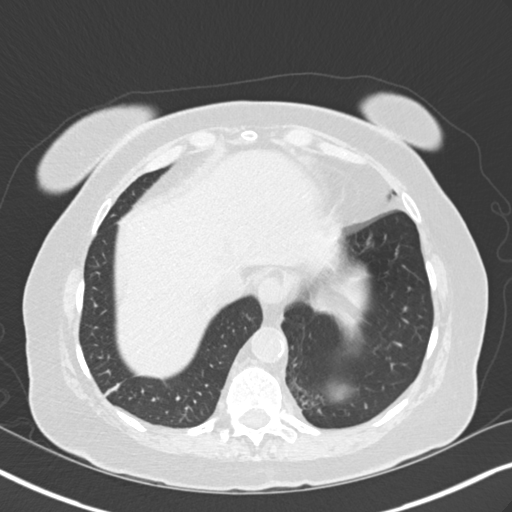
[im 41/138  lung]
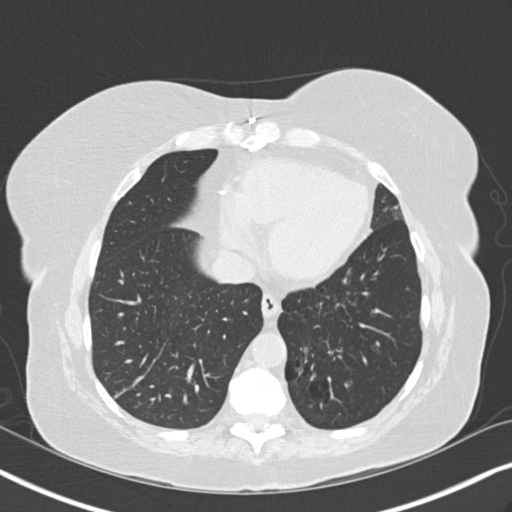
[im 51/138  mediastinal]
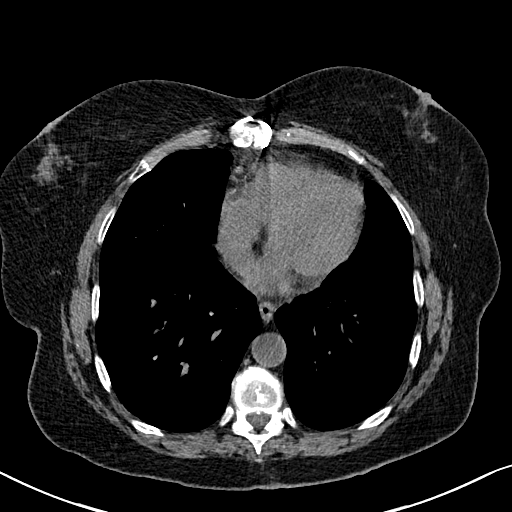
[im 51/138  lung]
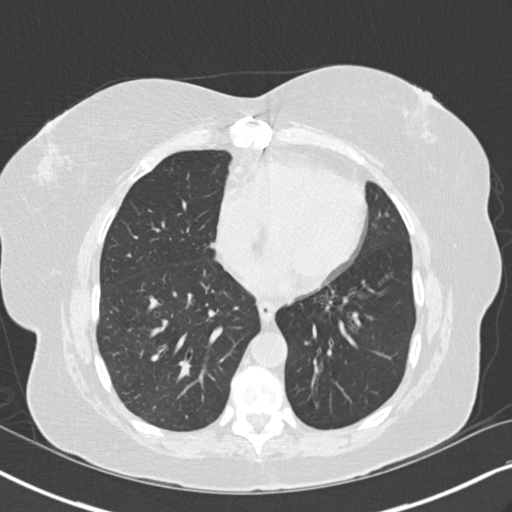
[im 61/138  lung]
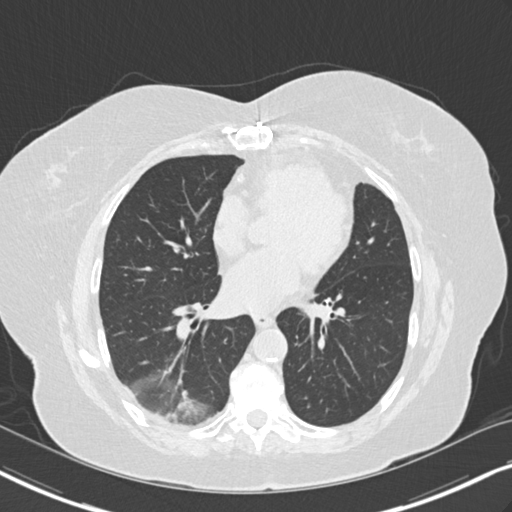
[im 77/138  lung]
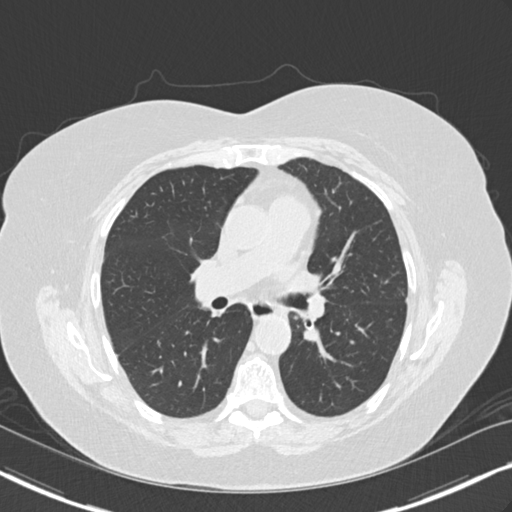
[im 87/138  lung]
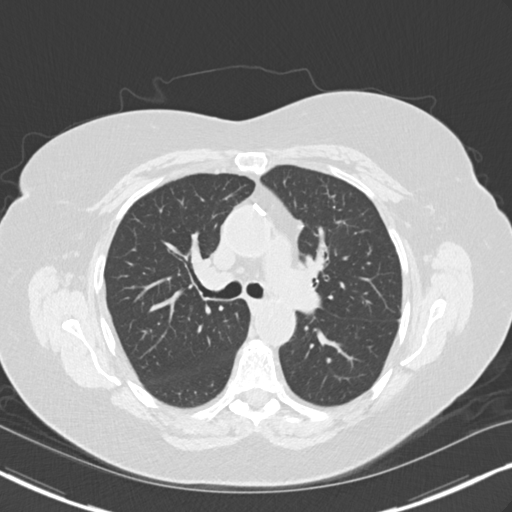
[im 97/138  mediastinal]
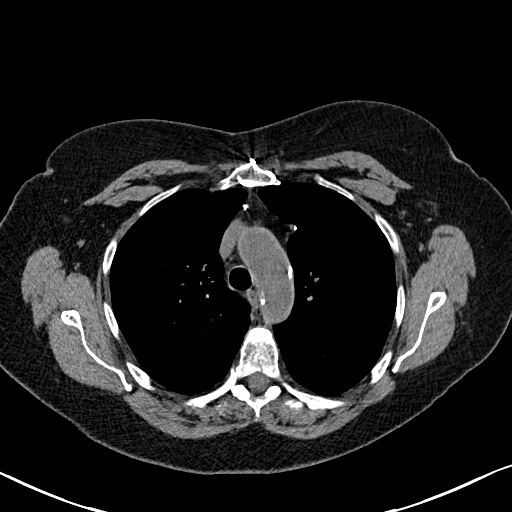
[im 97/138  lung]
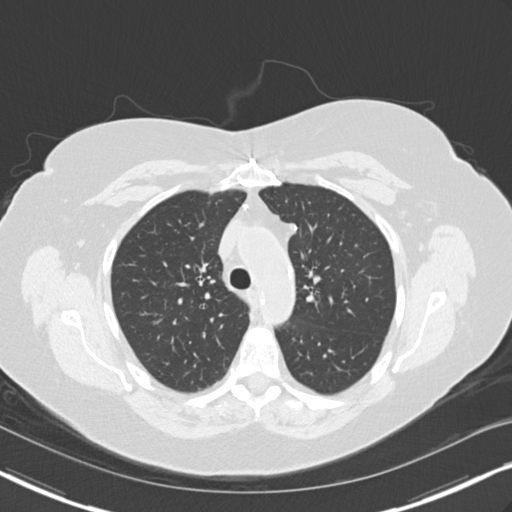
[im 107/138  lung]
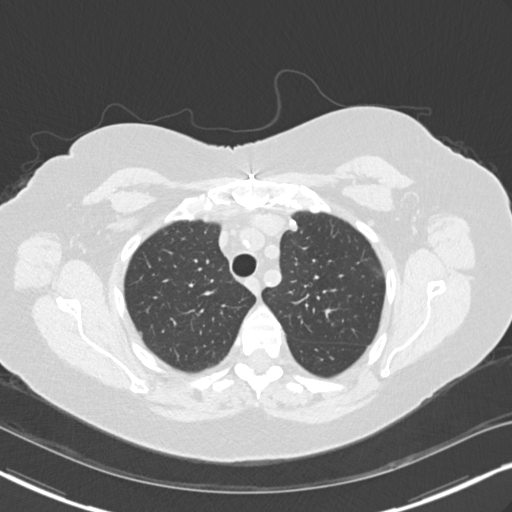
[im 117/138  lung]
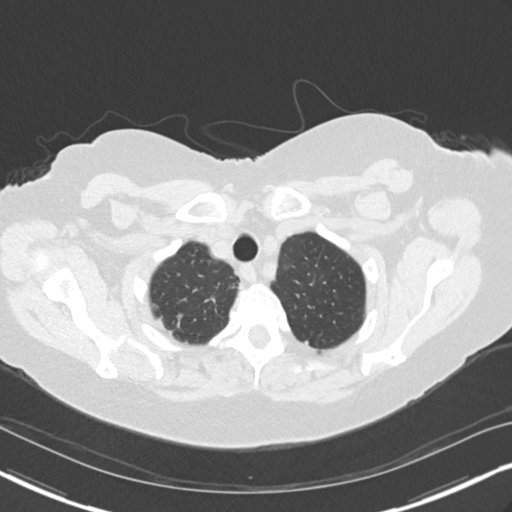
[im 127/138  lung]
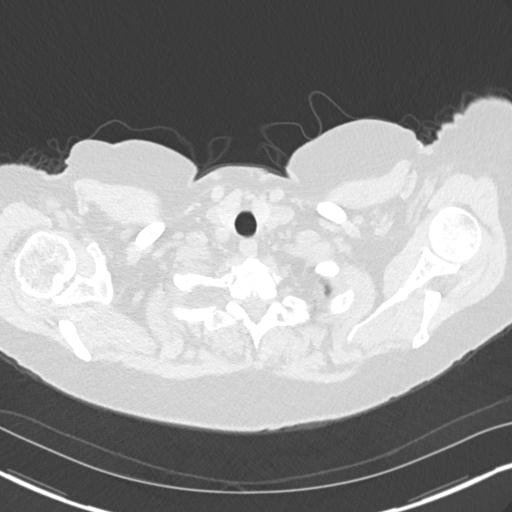

[Series 6: coronal · coronal · 0.57mm/px · 3 of 123 slices shown]
[im 25/123  lung]
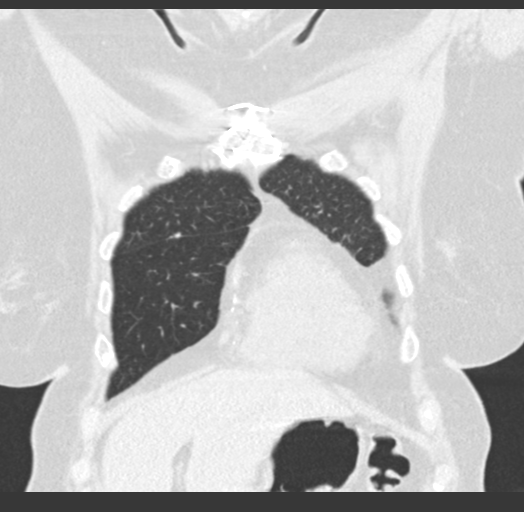
[im 49/123  lung]
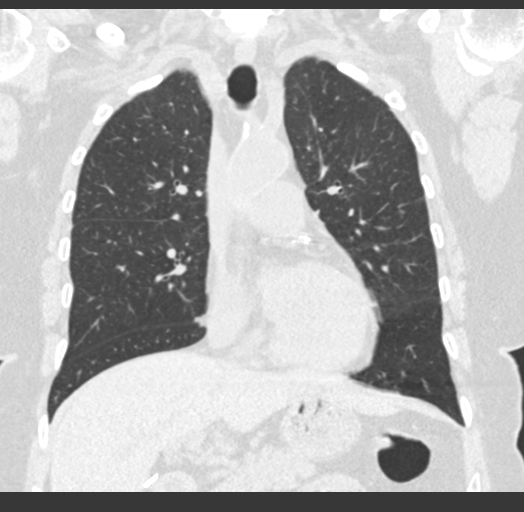
[im 74/123  lung]
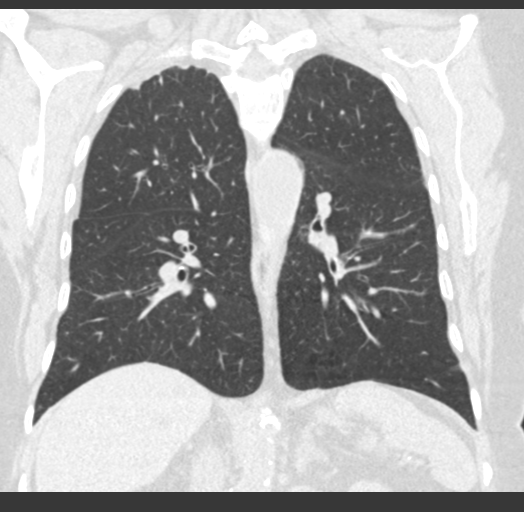

[15 of 36 positions shown; findings below may reference images not displayed]

FINDINGS: Cardiovascular: The heart size is normal. No substantial pericardial
effusion. Coronary artery calcification is evident. Atherosclerotic
calcification is noted in the wall of the thoracic aorta. Status
post CABG.

Mediastinum/Nodes: No mediastinal lymphadenopathy. No evidence for
gross hilar lymphadenopathy although assessment is limited by the
lack of intravenous contrast on today's study. The esophagus has
normal imaging features. There is no axillary lymphadenopathy.

Lungs/Pleura: The central tracheobronchial airways are patent.
Stable biapical pleuroparenchymal scarring. Calcified irregular
right upper lobe nodule is unchanged. Peripheral wedge-shaped area
of ground-glass attenuation in the posterior right lower lobe
persists and is similar to prior. 5 mm central nodular component is
unchanged. No new or progressive findings in the lungs. No pleural
effusion.

Upper Abdomen: Unremarkable.

Musculoskeletal: No worrisome lytic or sclerotic osseous
abnormality.
IMPRESSION: 1. Stable exam.  No new or progressive findings.
2. Wedge-shaped area of peripheral geographic ground-glass
attenuation in the posterior right lower lobe is stable. Nodular
components within this area are also unchanged in the interval.
Continued attention on follow-up recommended.
3.  Aortic Atherosclerois ([id number])

## 2018-11-24 ENCOUNTER — Telehealth: Payer: Self-pay | Admitting: Neurology

## 2018-11-24 NOTE — Telephone Encounter (Signed)
Pt called stating that she has had a headache for a while but has pushed it off since she is older and does not want to go out due to the COVID-19 but it is getting worse and is needing to be seen sooner than later. Please advise.

## 2018-11-24 NOTE — Telephone Encounter (Signed)
[name],  Could you call and offer a virtual video visit with Dr. Jannifer Franklin for 4/15 or 4/16?

## 2018-11-25 NOTE — Telephone Encounter (Signed)
Spoke with pt scheduled her for a virtual visit on 4/16- pt stated she would get her daughter to help her with the visit.   Due to current COVID 19 pandemic, our office is severely reducing in office visits for at least the next 2 weeks, in order to minimize the risk to our patients and healthcare providers. Pt understands that although there may be some limitations with this type of visit, we will take all precautions to reduce any security or privacy concerns.  Pt understands that this will be treated like an in office visit and we will file with pt's insurance, and there may be a patient responsible charge related to this service. Pt's email is (son's) [email or web address] (aware to use this email address for webex) . Pt understands that the [employer] webex software must be downloaded and operational on the device pt plans to use for the visit.

## 2018-11-25 NOTE — Telephone Encounter (Signed)
Noted  

## 2018-11-26 NOTE — Telephone Encounter (Signed)
I contacted the pt and updated the chart for tomorrow video visit.

## 2018-11-27 ENCOUNTER — Other Ambulatory Visit: Payer: Self-pay

## 2018-11-27 ENCOUNTER — Encounter: Payer: Self-pay | Admitting: Neurology

## 2018-11-27 ENCOUNTER — Ambulatory Visit (INDEPENDENT_AMBULATORY_CARE_PROVIDER_SITE_OTHER): Payer: Medicare Other | Admitting: Neurology

## 2018-11-27 DIAGNOSIS — G4489 Other headache syndrome: Secondary | ICD-10-CM

## 2018-11-27 HISTORY — DX: Other headache syndrome: G44.89

## 2018-11-27 MED ORDER — GABAPENTIN 100 MG PO CAPS: 100.0000 mg | ORAL_CAPSULE | Freq: Three times a day (TID) | ORAL | 3 refills | Status: DC

## 2018-11-27 NOTE — Progress Notes (Signed)
     Virtual Visit via Video Note  I connected with [name] on 11/27/18 at 11:30 AM EDT by a video enabled telemedicine application and verified that I am speaking with the correct person using two identifiers.   I discussed the limitations of evaluation and management by telemedicine and the availability of in person appointments. The patient expressed understanding and agreed to proceed.  History of Present Illness: [name] is a 78 year old right-handed white female with a history of an occipital craniectomy for Arnold-Chiari malformation.  The patient was seen in September 2019 for positional headaches that were daily in nature, they would go away with lying down and come on with sitting or standing.  The patient had a total myelogram looking for evidence of a spinal fluid leak, this was never delineated.  The headaches seem to get better on their own, and the patient claimed that the headaches went away until about the first part of April 2020.  The patient now has intermittent headaches that may last up to a day or 2, the patient has had 5 or 6 days with headache since the beginning of April.  The patient reports photophobia with a headache, no phonophobia.  She may feel somewhat queasy without vomiting.  She does note some neck stiffness and discomfort, some occasional ringing in the ears.  She denies any focal numbness or weakness of the face, arms, legs.  She denies any significant vision changes.  If the headache comes on, she gets into a dark room and lays down which seems to help.   Observations/Objective: On the WebEx evaluation the patient is alert and cooperative.  Extraocular movements are full.  Facial symmetry appears to be present, the patient is able to protrude the tongue in the midline with good lateral movements of the tongue.  The patient has a normal speech pattern, no aphasia or dysarthria is noted.  The patient lacks about 15 degrees of full lateral rotation of the  cervical spine bilaterally.  She is able to ambulate normally, Romberg is negative, no drift is seen.  The patient has good finger-nose-finger and heel shin bilaterally.  Assessment and Plan: 1.  Intermittent headache, possible migraine  The patient has started having headaches once again.  We will add low-dose gabapentin taking 100 mg 3 times daily.  The patient will contact me for any dose adjustments, she will follow-up in 4 or 5 months.  Follow Up Instructions: 64-month follow-up with me.   I discussed the assessment and treatment plan with the patient. The patient was provided an opportunity to ask questions and all were answered. The patient agreed with the plan and demonstrated an understanding of the instructions.   The patient was advised to call back or seek an in-person evaluation if the symptoms worsen or if the condition fails to improve as anticipated.  I provided 15 minutes of non-face-to-face time during this encounter.   [name], MD

## 2018-12-09 ENCOUNTER — Other Ambulatory Visit: Payer: Self-pay

## 2018-12-09 ENCOUNTER — Emergency Department (HOSPITAL_COMMUNITY): Payer: Medicare Other

## 2018-12-09 ENCOUNTER — Encounter (HOSPITAL_COMMUNITY): Payer: Self-pay | Admitting: Emergency Medicine

## 2018-12-09 ENCOUNTER — Emergency Department (HOSPITAL_COMMUNITY)
Admission: EM | Admit: 2018-12-09 | Discharge: 2018-12-09 | Disposition: A | Discharge status: Home / Self Care | Payer: Medicare Other | Attending: Emergency Medicine | Admitting: Emergency Medicine

## 2018-12-09 DIAGNOSIS — Z951 Presence of aortocoronary bypass graft: Secondary | ICD-10-CM | POA: Insufficient documentation

## 2018-12-09 DIAGNOSIS — I10 Essential (primary) hypertension: Secondary | ICD-10-CM | POA: Insufficient documentation

## 2018-12-09 DIAGNOSIS — R29810 Facial weakness: Secondary | ICD-10-CM | POA: Diagnosis present

## 2018-12-09 DIAGNOSIS — Z79899 Other long term (current) drug therapy: Secondary | ICD-10-CM | POA: Insufficient documentation

## 2018-12-09 DIAGNOSIS — J45909 Unspecified asthma, uncomplicated: Secondary | ICD-10-CM | POA: Insufficient documentation

## 2018-12-09 DIAGNOSIS — R51 Headache: Secondary | ICD-10-CM | POA: Insufficient documentation

## 2018-12-09 DIAGNOSIS — J449 Chronic obstructive pulmonary disease, unspecified: Secondary | ICD-10-CM | POA: Insufficient documentation

## 2018-12-09 DIAGNOSIS — Z7902 Long term (current) use of antithrombotics/antiplatelets: Secondary | ICD-10-CM | POA: Diagnosis not present

## 2018-12-09 DIAGNOSIS — R519 Headache, unspecified: Secondary | ICD-10-CM

## 2018-12-09 DIAGNOSIS — I2510 Atherosclerotic heart disease of native coronary artery without angina pectoris: Secondary | ICD-10-CM | POA: Diagnosis not present

## 2018-12-09 DIAGNOSIS — Z87891 Personal history of nicotine dependence: Secondary | ICD-10-CM | POA: Diagnosis not present

## 2018-12-09 MED ORDER — ACETAMINOPHEN 500 MG PO TABS
1000.0000 mg | ORAL_TABLET | Freq: Once | ORAL | Status: AC
  Administered 2018-12-09: 1000 mg via ORAL
  Filled 2018-12-09: qty 2

## 2018-12-09 NOTE — ED Provider Notes (Signed)
Care assumed from [facility] couturier and Dr. Alvino Chapel as patient was transferred to yellow zone, please see their note for full details, but in brief [name] is a 78 y.o. female with a history of lung cancer, asthma, COPD, Arnold-Chiari malformation, CAD, aneurysm and headaches, who presents to the emergency department for evaluation of headache.  Family noticed a right sided mouth droop at 830 this morning, last known normal at 10 PM per the patient's son.  Patient reports that she has noticed some decreased sensation over the right cheek intermittently but is unsure how long this is been going she has not had any changes in speech, no focal weakness, of the extremities.  Dr. Alvino Chapel evaluated patient on arrival, no code stroke was called.  Given patient's history of headaches, and Chiari malformation we will plan for MRI and MRA of the Natalie.  Prior care team did not feel that labs were indicated unless any abnormalities are noted on head imaging that would require admission.  Patient has been followed by Dr. Jannifer Franklin with neurology regarding these headaches had recent tele-visit on 4/16, she has had a total myelogram recently for these headaches to look for any spinal fluid leak which was not found.  Headaches tend to get better on their own or with Tylenol, but can last up to 2 days per Dr. Tobey Grim recent note.  Plan: Follow-up on MRI and MRA, if normal patient can be discharged home with close follow-up with her neurologist.  Physical Exam  BP (!) 167/59 (BP Location: Left Arm)   Pulse 66   Temp (!) 97.5 F (36.4 C) (Oral)   Resp 20   Ht 5\' 2"  (1.575 m)   Wt 71.2 kg   SpO2 100%   BMI 28.72 kg/m   Physical Exam Vitals signs and nursing note reviewed.  Constitutional:      General: She is not in acute distress.    Appearance: She is well-developed. She is not diaphoretic.  HENT:     Head: Normocephalic and atraumatic.   Eyes:     General:        Right eye: No discharge.         Left eye: No discharge.  Pulmonary:     Effort: Pulmonary effort is normal. No respiratory distress.  Neurological:     Mental Status: She is alert.     Coordination: Coordination normal.     Comments: Speech is clear, able to follow commands CN III-XII intact Normal strength in upper and lower extremities bilaterally including dorsiflexion and plantar flexion, strong and equal grip strength Sensation normal to light and sharp touch Moves extremities without ataxia, coordination intact Normal finger to nose and rapid alternating movements No pronator drift  Psychiatric:        Behavior: Behavior normal.     ED Course/Procedures  Labs Reviewed - No data to display  Mr [name] Head Mccullough Contrast  Result Date: 12/09/2018 CLINICAL DATA:  Right-sided facial droop upon waking this morning. Normal last night. EXAM: MRI HEAD WITHOUT CONTRAST MRA HEAD WITHOUT CONTRAST TECHNIQUE: Multiplanar, multiecho pulse sequences of the Natalie and surrounding structures were obtained without intravenous contrast. Angiographic images of the head were obtained using MRA technique without contrast. COMPARISON:  01/14/2018.  01/01/2017 FINDINGS: MRI HEAD FINDINGS Natalie: Diffusion imaging does not show any acute or subacute infarction. The brainstem and cerebellum are normal. Cerebral hemispheres show mild age related volume loss with only a few punctate foci of T2 and FLAIR signal  within the white matter consistent with minimal small vessel change. No cortical or large vessel territory infarction. No mass lesion, hemorrhage, hydrocephalus or extra-axial collection. Vascular: Major vessels at the base of the Natalie show flow. Skull and upper cervical spine: Negative Sinuses/Orbits: Clear/normal Other: None MRA HEAD FINDINGS Both internal carotid arteries are widely patent into the Natalie. No siphon stenosis. The anterior and middle cerebral vessels are patent without proximal stenosis. Unchanged is a 2 mm aneurysm at the right  MCA bifurcation, not likely significant. Both vertebral arteries are widely patent to the basilar. No basilar stenosis. Posterior circulation branch vessels appear normal. IMPRESSION: No acute or subacute insult. Essentially normal study for age, with mild age related volume loss and only very minimal small vessel change of the white matter, less than often seen in healthy individuals of this age. Negative intracranial MR angiography of the large and medium size vessels. No change in a likely incidental 2 mm aneurysm at the right MCA bifurcation. Electronically Signed   By: Nelson Chimes M.D.   On: 12/09/2018 16:59   Mr [name] Contrast  Result Date: 12/09/2018 CLINICAL DATA:  Right-sided facial droop upon waking this morning. Normal last night. EXAM: MRI HEAD WITHOUT CONTRAST MRA HEAD WITHOUT CONTRAST TECHNIQUE: Multiplanar, multiecho pulse sequences of the Natalie and surrounding structures were obtained without intravenous contrast. Angiographic images of the head were obtained using MRA technique without contrast. COMPARISON:  01/14/2018.  01/01/2017 FINDINGS: MRI HEAD FINDINGS Natalie: Diffusion imaging does not show any acute or subacute infarction. The brainstem and cerebellum are normal. Cerebral hemispheres show mild age related volume loss with only a few punctate foci of T2 and FLAIR signal within the white matter consistent with minimal small vessel change. No cortical or large vessel territory infarction. No mass lesion, hemorrhage, hydrocephalus or extra-axial collection. Vascular: Major vessels at the base of the Natalie show flow. Skull and upper cervical spine: Negative Sinuses/Orbits: Clear/normal Other: None MRA HEAD FINDINGS Both internal carotid arteries are widely patent into the Natalie. No siphon stenosis. The anterior and middle cerebral vessels are patent without proximal stenosis. Unchanged is a 2 mm aneurysm at the right MCA bifurcation, not likely significant. Both vertebral arteries are  widely patent to the basilar. No basilar stenosis. Posterior circulation branch vessels appear normal. IMPRESSION: No acute or subacute insult. Essentially normal study for age, with mild age related volume loss and only very minimal small vessel change of the white matter, less than often seen in healthy individuals of this age. Negative intracranial MR angiography of the large and medium size vessels. No change in a likely incidental 2 mm aneurysm at the right MCA bifurcation. Electronically Signed   By: Nelson Chimes M.D.   On: 12/09/2018 16:59     Procedures  MDM   Patient with recurrent headaches these have been ongoing and patient has been following with neurologist regarding these.  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  Patient's neurologic exam remains intact.  At this time I feel she is stable for discharge home with outpatient follow-up with her neurologist and PCP.  Called and provided patient's daughter Margarita Grizzle with an update and she expresses understanding and agreement with plan.  Patient stable for discharge home at this time.  Return precautions discussed.       Jacqlyn Larsen, PA-C 12/09/18 [place], Nathan, MD 12/10/18 [phone]

## 2018-12-09 NOTE — ED Notes (Signed)
Pt daughter would like to be updated when possible. Daughters name is Margarita Grizzle [phone]

## 2018-12-09 NOTE — ED Triage Notes (Signed)
Pt in with R mouth droop noticed upon waking at 0830 per family. LSN at bedtime, 10pm per son. Denies any weakness or speech changes. Per Dr. Alvino Chapel, do not call code stroke, sent to room on arrival

## 2018-12-09 NOTE — ED Provider Notes (Signed)
[facility] EMERGENCY DEPARTMENT Provider Note   CSN: [id number] Arrival date & time: 12/09/18  1015    History   Chief Complaint Chief Complaint  Patient presents with  . Facial Droop    HPI [name] is a 78 y.o. female.     HPI   78 year old female with a history of aneurysm of the common iliac artery, aortoiliac occlusive disease, Roselie Awkward Chiari malformation, bilateral occipital neuralgia, CAD, COPD, lung cancer, MI, who presents to the emergency department today for evaluation of right-sided facial droop.  Patient states that her family member thought that her face looked swollen and the right side of her face appeared droopy earlier today.  She does not know exactly when this started but did not notice anything different about her face when she went to bed last night.  She also states she did not notice any facial droop, but states that the right side of her face seems "swollen". She denies any vision changes, numbness/weakness, difficulty ambulating, slurred speech or difficulty with word finding.  She has had headaches though this is a chronic problem for her and is currently being worked up by neurology.  States she contacted her doctor and was advised to come to the ED.  Denies any other complaints including no chest pain, shortness of breath, abdominal pain, nausea or vomiting. No recent falls or trauma.  Past Medical History:  Diagnosis Date  . 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[place])   . Arnold-Chiari malformation ([place]) 1998  . Asthma   . 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  . Blood in stool    last week of aug 2018  . Carotid artery occlusion   . Complication of anesthesia   . COPD (chronic obstructive pulmonary disease) ([place])   . Coronary artery disease   . 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 Diverticulitis   . Dyspnea    with exertion  . Gastroesophageal reflux disease    occ  . Glaucoma     right eye  . Headache    tension  . Headache syndrome 11/27/2018  . Hiatal hernia   . Hyperlipidemia   . Hypertension   . Iliac artery aneurysm ([place])   . Lung cancer ([place]) dx 2018   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  . Myocardial infarction [facility]) 01/01/2000   Cardiac catheterization  . Peripheral vascular disease ([place])    stents in legs x 2 or 3  . Pneumonia    last time winter 2017 -2018  . PONV (postoperative nausea and vomiting)    occassionally, last colonscopy did ok with anesthesia  . Reflux     Patient Active Problem List   Diagnosis Date Noted  . Headache syndrome 11/27/2018  . GI bleed 07/15/2018  . Occult blood positive stool 07/14/2018  . History of shingles 06/23/2018  . Neuralgia and neuritis 06/23/2018  . Costochondritis, acute 04/18/2018  . Hyperkalemia 02/06/2017  . Physical deconditioning 11/08/2016  . Acute lower GI bleeding   . BRBPR (bright red blood per rectum) 09/15/2016  . Iron deficiency anemia 06/07/2016  . Bronchitis, acute 06/07/2016  . 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 Primary cancer of right lower lobe of lung ([place]) 04/25/2016  . 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 Foot pain, right 10/24/2015  . CAP (community acquired pneumonia) 10/22/2015  . UTI (urinary tract infection) 10/22/2015  . COPD exacerbation ([place]) 10/22/2015  . Neck pain on right side  01/05/2015  . Chest pain 08/02/2014  . Intractable headache 08/02/2014  . HLD (hyperlipidemia) 08/02/2014  . Essential hypertension 08/02/2014  . GERD (gastroesophageal reflux disease) 08/02/2014  . Leg pain, right 08/02/2014  . HA (headache)   . Carotid artery stenosis 12/09/2013  . 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  . Dehydration 04/02/2013  . CAD (coronary artery disease) 04/02/2013  . AKI (acute kidney injury) ([place]) 04/01/2013  . Nausea & vomiting 04/01/2013  . Diarrhea 04/01/2013  . Hypokalemia 04/01/2013  . Hyponatremia 04/01/2013  . Peripheral vascular disease ([place])  01/07/2013  . Occlusion and stenosis of carotid artery without mention of cerebral infarction 01/07/2013  . Dizziness and giddiness 01/07/2013  . Shortness of breath 01/07/2013  . Cough 11/17/2012  . COPD GOLD II 11/17/2012    Past Surgical History:  Procedure Laterality Date  . ABDOMINAL HYSTERECTOMY    . APPENDECTOMY    . Arnold-chiari malformation repair  1998   Suboccipital craniectomy  . CAROTID ENDARTERECTOMY  03/29/2010   Left  CEA  . CHOLECYSTECTOMY     Gall Bladder  . COLONOSCOPY WITH PROPOFOL N/A 04/22/2015   Procedure: COLONOSCOPY WITH PROPOFOL;  Surgeon: Carol Ada, MD;  Location: [facility] ENDOSCOPY;  Service: Endoscopy;  Laterality: N/A;  . COLONOSCOPY WITH PROPOFOL N/A 05/25/2016   Procedure: COLONOSCOPY WITH PROPOFOL;  Surgeon: Carol Ada, MD;  Location: [facility] ENDOS[facility];  Service: Endoscopy;  Laterality: N/A;  . COLONOSCOPY WITH PROPOFOL N/A 05/03/2017   Procedure: COLONOSCOPY WITH PROPOFOL;  Surgeon: Carol Ada, MD;  Location: [facility] ENDOS[facility];  Service: Endoscopy;  Laterality: N/A;  . CORNEAL TRANSPLANT     Right  . CORONARY ARTERY BYPASS GRAFT  01/01/2000   x 3  . ENTEROSCOPY N/A 02/27/2018   Procedure: ENTEROSCOPY;  Surgeon: Carol Ada, MD;  Location: [facility];  Service: Endoscopy;  Laterality: N/A;  . ENTEROSCOPY N/A 07/18/2018   Procedure: ENTEROSCOPY;  Surgeon: Carol Ada, MD;  Location: [facility];  Service: Endoscopy;  Laterality: N/A;  . ESOPHAGOGASTRODUODENOSCOPY N/A 05/25/2016   Procedure: ESOPHAGOGASTRODUODENOSCOPY (EGD);  Surgeon: Carol Ada, MD;  Location: [facility];  Service: Endoscopy;  Laterality: N/A;  . ESOPHAGOGASTRODUODENOSCOPY N/A 08/01/2018   Procedure: ESOPHAGOGASTRODUODENOSCOPY (EGD);  Surgeon: Carol Ada, MD;  Location: [facility];  Service: Endoscopy;  Laterality: N/A;  . EYE SURGERY Right 1995 or 1996   Laser surgery for retinal hemorrhage  . GIVENS CAPSULE STUDY N/A 07/16/2018   Procedure: GIVENS CAPSULE STUDY;  Surgeon:  Carol Ada, MD;  Location: [facility];  Service: Endoscopy;  Laterality: N/A;  . HOT HEMOSTASIS N/A 02/27/2018   Procedure: HOT HEMOSTASIS (ARGON PLASMA COAGULATION/BICAP);  Surgeon: Carol Ada, MD;  Location: [facility];  Service: Endoscopy;  Laterality: N/A;  . HOT HEMOSTASIS N/A 08/01/2018   Procedure: HOT HEMOSTASIS (ARGON PLASMA COAGULATION/BICAP);  Surgeon: Carol Ada, MD;  Location: [facility];  Service: Endoscopy;  Laterality: N/A;  . IR RADIOLOGIST EVAL & MGMT  12/14/2016  . LEFT HEART CATH AND CORS/GRAFTS ANGIOGRAPHY N/A 01/09/2018   Procedure: LEFT HEART CATH AND CORS/GRAFTS ANGIOGRAPHY;  Surgeon: Charolette Forward, MD;  Location: [facility] CV LAB;  Service: Cardiovascular;  Laterality: N/A;  . LEFT HEART CATHETERIZATION WITH CORONARY ANGIOGRAM N/A 08/03/2014   Procedure: LEFT HEART CATHETERIZATION WITH CORONARY ANGIOGRAM;  Surgeon: Birdie Riddle, MD;  Location: [facility] CATH LAB;  Service: Cardiovascular;  Laterality: N/A;  . Post Coronary Artery  BPG  01/05/2000   Right jugular sheath removed  . PR VEIN BYPASS GRAFT,AORTO-FEM-POP    . ROTATOR CUFF REPAIR  Right     OB History   No obstetric history on file.      Home Medications    Prior to Admission medications   Medication Sig Start Date End Date Taking? Authorizing Provider  acetaminophen (TYLENOL) 325 MG tablet Take 650 mg by mouth every 6 (six) hours as needed for moderate pain or headache.    Yes [provider]  amLODipine-olmesartan (AZOR) 5-40 MG tablet Take 1 tablet by mouth daily.   Yes [provider]  cetirizine (ZYRTEC) 10 MG tablet Take 10 mg by mouth as needed for allergies.   Yes [provider]  clopidogrel (PLAVIX) 75 MG tablet Take 1 tablet (75 mg total) by mouth daily. 04/02/12  Yes Conrad [place], MD  dexlansoprazole (DEXILANT) 60 MG capsule Take 1 capsule (60 mg total) by mouth daily before breakfast. 11/17/12  Yes Tanda Rockers, MD  ferrous sulfate 325 (65 FE) MG  tablet Take 1 tablet (325 mg total) by mouth daily with breakfast. 07/19/18 12/09/18 Yes Alma Friendly, MD  gabapentin (NEURONTIN) 100 MG capsule Take 1 capsule (100 mg total) by mouth 3 (three) times daily. 11/27/18  Yes [name], MD  nitroGLYCERIN (NITROSTAT) 0.4 MG SL tablet Place 0.4 mg under the tongue every 5 (five) minutes as needed for chest pain.    Yes [provider]  prednisoLONE acetate (PRED FORTE) 1 % ophthalmic suspension Place 1 drop into the right eye every other day.  02/26/14  Yes [provider]  rosuvastatin (CRESTOR) 10 MG tablet Take 10 mg by mouth at bedtime.    Yes [provider]  spironolactone (ALDACTONE) 25 MG tablet Take 25 mg by mouth daily. 03/25/17  Yes [provider]  umeclidinium-vilanterol (ANORO ELLIPTA) 62.5-25 MCG/INH AEPB Inhale 2 puffs into the lungs daily. 03/14/17  Yes [provider]  polyethylene glycol (MIRALAX / GLYCOLAX) packet Take 17 g by mouth daily. Patient not taking: Reported on 12/09/2018 07/18/18   Alma Friendly, MD    Family History Family History  Problem Relation Age of Onset  . Heart disease Mother        Heart Disease before age 17  . Hypertension Mother   . Hyperlipidemia Mother   . Heart attack Mother   . Clotting disorder Mother   . Heart disease Father        Heart Disease before age 65  . Heart attack Father   . Hyperlipidemia Father   . Hypertension Father   . Heart disease Brother        Heart Disease before age 39  . Hyperlipidemia Brother   . Hypertension Brother   . Clotting disorder Brother   . AAA (abdominal aortic aneurysm) Brother   . Cerebral aneurysm Sister   . Hypertension Sister   . AAA (abdominal aortic aneurysm) Sister   . Asthma Sister   . Cerebral aneurysm Brother   . Cancer Brother        Lung  . Hypertension Brother   . Heart attack Brother   . Heart disease Brother        Aneurysm of Brain  . Hypertension Brother   . Heart  disease Brother   . Heart disease Brother   . Stroke Son        Aneurysm of Stomach  . AAA (abdominal aortic aneurysm) Son   . Cancer Maternal Uncle        great uncle/cancer/type unknown    Social History Social History  Tobacco Use  . Smoking status: Former Smoker    Packs/day: 1.50    Years: 30.00    Pack years: 45.00    Types: Cigarettes    Last attempt to quit: 08/13/2000    Years since quitting: 18.3  . Smokeless tobacco: Never Used  Substance Use Topics  . Alcohol use: No    Alcohol/week: 0.0 standard drinks  . Drug use: No     Allergies   Hydromorphone; Levaquin [levofloxacin]; Azithromycin; Codeine; Doxycycline; Oxycodone-acetaminophen; Risedronate; and Avelox [moxifloxacin hcl in nacl]   Review of Systems Review of Systems  Constitutional: Negative for chills and fever.  HENT: Negative for ear pain and sore throat.        Right side of face swollen  Eyes: Negative for pain and visual disturbance.  Respiratory: Negative for cough and shortness of breath.   Cardiovascular: Negative for chest pain.  Gastrointestinal: Negative for abdominal pain, constipation, diarrhea, nausea and vomiting.  Genitourinary: Negative for dysuria and hematuria.  Musculoskeletal: Negative for back pain and myalgias.  Skin: Negative for color change and rash.  Neurological: Positive for facial asymmetry. Negative for dizziness, syncope, weakness, light-headedness, numbness and headaches.  All other systems reviewed and are negative.    Physical Exam Updated Vital Signs BP (!) 167/59 (BP Location: Left Arm)   Pulse 66   Temp (!) 97.5 F (36.4 C) (Oral)   Resp 20   Ht 5\' 2"  (1.575 m)   Wt 71.2 kg   SpO2 100%   BMI 28.72 kg/m   Physical Exam Vitals signs and nursing note reviewed.  Constitutional:      General: She is not in acute distress.    Appearance: She is well-developed. She is not ill-appearing.  HENT:     Head: Normocephalic and atraumatic.     Mouth/Throat:      Mouth: Mucous membranes are moist.     Pharynx: No oropharyngeal exudate or posterior oropharyngeal erythema.     Comments: Right cheek is very subtly erythematous. No facial swelling noted. Eyes:     Extraocular Movements: Extraocular movements intact.     Conjunctiva/sclera: Conjunctivae normal.     Pupils: Pupils are equal, round, and reactive to light.  Neck:     Musculoskeletal: Neck supple.  Cardiovascular:     Rate and Rhythm: Normal rate and regular rhythm.     Heart sounds: Normal heart sounds. No murmur.  Pulmonary:     Effort: Pulmonary effort is normal. No respiratory distress.     Breath sounds: Normal breath sounds. No wheezing, rhonchi or rales.  Abdominal:     General: Bowel sounds are normal.     Palpations: Abdomen is soft.     Tenderness: There is no abdominal tenderness.  Skin:    General: Skin is warm and dry.  Neurological:     Mental Status: She is alert.     Comments: Mental Status:  Alert, thought content appropriate, able to give a coherent history. Speech fluent without evidence of aphasia. Able to follow 2 step commands without difficulty.  Cranial Nerves:  II:  pupils equal, round, reactive to light III,IV, VI: ptosis not present, extra-ocular motions intact bilaterally  V,VII: smile symmetric, slightly abnormal sensation to the right cheek, otherwise sensation is symmetric to the remainder of the face VIII: hearing grossly normal to voice  X: uvula elevates symmetrically  XI: bilateral shoulder shrug symmetric and strong XII: midline tongue extension without fassiculations Motor:  Normal tone. 5/5 strength of BUE and  BLE major muscle groups including strong and equal grip strength and dorsiflexion/plantar flexion Sensory: light touch normal in all extremities. Cerebellar: normal finger-to-nose with bilateral upper extremities CV: 2+ radial and DP pulses       ED Treatments / Results  Labs (all labs ordered are listed, but only abnormal  results are displayed) Labs Reviewed - No data to display  EKG None  Radiology No results found.  Procedures Procedures (including critical care time)  Medications Ordered in ED Medications  acetaminophen (TYLENOL) tablet 1,000 mg (1,000 mg Oral Given 12/09/18 1228)     Initial Impression / Assessment and Plan / ED Course  I have reviewed the triage vital signs and the nursing notes.  Pertinent labs & imaging results that were available during my care of the patient were reviewed by me and considered in my medical decision making (see chart for details).   Final Clinical Impressions(s) / ED Diagnoses   Final diagnoses:  None   78 year old female with a history of aneurysm of the common iliac artery, aortoiliac occlusive disease, Arnold Chiari malformation, bilateral occipital neuralgia, CAD, COPD, lung cancer, MI, presents to the ED for eval of right sided facial droop noted by family PTA. Pt herself is c/o a sensation of swelling to the right cheek.   No obvious facial droop on exam. Abnormal sensation to small area of the patients right cheek, but otherwise no sensory changes. Strength equal bilat. Normal finger to nose. Remainder of exam is benign.  Given patients hx will obtain MRI and MRA of the brain. If negative, patient can likely f/u with her neurologist.  Pt was seen and examined by Dr. Alvino Chapel who recommended above plan.   Care signed out to Benedetto Goad, Pa-C to f/u on imaging studies and make appropriate disposition decision.   ED Discharge Orders    None       Bishop Dublin 12/09/18 1315    Davonna Belling, MD 12/09/18 1504

## 2018-12-09 NOTE — Discharge Instructions (Signed)
Your MRI and MRA are reassuring today, please follow-up with your neurologist for continued evaluation regarding these headaches.  Your imaging today shows no evidence of stroke.  If you have severe worsening usual headache, fevers, persistent vomiting or you develop persistent facial droop, change in your speech, numbness weakness or tingling in any extremities please return for reevaluation.

## 2018-12-09 NOTE — ED Notes (Signed)
Patient has spoke with daughter Margarita Grizzle on the phone and updates have been given.

## 2018-12-09 NOTE — ED Notes (Signed)
Patient verbalizes understanding of discharge instructions. Opportunity for questioning and answers were provided. Armband removed by staff, pt discharged from ED.  

## 2018-12-09 NOTE — ED Notes (Signed)
Pt alert and pleasant, able to recall her history without difficulty.  No gross concerns noted.

## 2018-12-09 NOTE — ED Notes (Signed)
Also noted history of chiari malformation.

## 2018-12-10 ENCOUNTER — Other Ambulatory Visit: Payer: Self-pay | Admitting: Neurology

## 2018-12-10 MED ORDER — GABAPENTIN 100 MG PO CAPS: 200.0000 mg | ORAL_CAPSULE | Freq: Three times a day (TID) | ORAL | 3 refills | Status: DC

## 2018-12-10 NOTE — Telephone Encounter (Signed)
I called the patient.  The patient did go to the emergency room for some right facial swelling sensation, MRI of the head and MRA of the head were relatively unremarkable.  She is still having headaches, she is tolerating the 100 mg 3 times a day dose of the gabapentin, we will increase the dose to 200 mg 3 times daily.  I will send in a prescription for this.

## 2018-12-10 NOTE — Telephone Encounter (Signed)
Pt called in and stated she was seen in the ER yesterday for right side facial swelling , she stated she was advised to contact Dr Jannifer Franklin and let him know if the MRI that was taken and ask him to take a look at it to make sure there were no findings, pt stated she is still having the bad headache with no relief

## 2019-02-11 ENCOUNTER — Observation Stay (HOSPITAL_COMMUNITY)
Admission: EM | Admit: 2019-02-11 | Discharge: 2019-02-12 | Disposition: A | Discharge status: Home / Self Care | Payer: Medicare Other | Attending: Internal Medicine | Admitting: Internal Medicine

## 2019-02-11 ENCOUNTER — Emergency Department (HOSPITAL_COMMUNITY): Payer: Medicare Other

## 2019-02-11 ENCOUNTER — Observation Stay (HOSPITAL_COMMUNITY): Payer: Medicare Other

## 2019-02-11 DIAGNOSIS — Z87891 Personal history of nicotine dependence: Secondary | ICD-10-CM | POA: Diagnosis not present

## 2019-02-11 DIAGNOSIS — M542 Cervicalgia: Secondary | ICD-10-CM

## 2019-02-11 DIAGNOSIS — I639 Cerebral infarction, unspecified: Secondary | ICD-10-CM | POA: Diagnosis not present

## 2019-02-11 DIAGNOSIS — R079 Chest pain, unspecified: Secondary | ICD-10-CM | POA: Diagnosis not present

## 2019-02-11 DIAGNOSIS — Z7901 Long term (current) use of anticoagulants: Secondary | ICD-10-CM | POA: Insufficient documentation

## 2019-02-11 DIAGNOSIS — G459 Transient cerebral ischemic attack, unspecified: Secondary | ICD-10-CM | POA: Diagnosis present

## 2019-02-11 DIAGNOSIS — M5481 Occipital neuralgia: Secondary | ICD-10-CM

## 2019-02-11 DIAGNOSIS — Z85118 Personal history of other malignant neoplasm of bronchus and lung: Secondary | ICD-10-CM | POA: Diagnosis not present

## 2019-02-11 DIAGNOSIS — J45909 Unspecified asthma, uncomplicated: Secondary | ICD-10-CM | POA: Diagnosis not present

## 2019-02-11 DIAGNOSIS — G43909 Migraine, unspecified, not intractable, without status migrainosus: Principal | ICD-10-CM | POA: Insufficient documentation

## 2019-02-11 DIAGNOSIS — I739 Peripheral vascular disease, unspecified: Secondary | ICD-10-CM | POA: Diagnosis not present

## 2019-02-11 DIAGNOSIS — Z79899 Other long term (current) drug therapy: Secondary | ICD-10-CM | POA: Diagnosis not present

## 2019-02-11 DIAGNOSIS — I2510 Atherosclerotic heart disease of native coronary artery without angina pectoris: Secondary | ICD-10-CM | POA: Diagnosis not present

## 2019-02-11 DIAGNOSIS — J449 Chronic obstructive pulmonary disease, unspecified: Secondary | ICD-10-CM | POA: Insufficient documentation

## 2019-02-11 DIAGNOSIS — C3431 Malignant neoplasm of lower lobe, right bronchus or lung: Secondary | ICD-10-CM | POA: Diagnosis present

## 2019-02-11 DIAGNOSIS — M792 Neuralgia and neuritis, unspecified: Secondary | ICD-10-CM

## 2019-02-11 DIAGNOSIS — I6529 Occlusion and stenosis of unspecified carotid artery: Secondary | ICD-10-CM | POA: Diagnosis present

## 2019-02-11 DIAGNOSIS — R200 Anesthesia of skin: Secondary | ICD-10-CM | POA: Diagnosis present

## 2019-02-11 DIAGNOSIS — Z951 Presence of aortocoronary bypass graft: Secondary | ICD-10-CM

## 2019-02-11 DIAGNOSIS — R471 Dysarthria and anarthria: Secondary | ICD-10-CM | POA: Diagnosis not present

## 2019-02-11 DIAGNOSIS — G4489 Other headache syndrome: Secondary | ICD-10-CM

## 2019-02-11 DIAGNOSIS — Z20828 Contact with and (suspected) exposure to other viral communicable diseases: Secondary | ICD-10-CM | POA: Insufficient documentation

## 2019-02-11 HISTORY — DX: Transient cerebral ischemic attack, unspecified: G45.9

## 2019-02-11 LAB — DIFFERENTIAL
Abs Immature Granulocytes: 0.03 10*3/uL (ref 0.00–0.07)
Basophils Absolute: 0 10*3/uL (ref 0.0–0.1)
Basophils Relative: 0 %
Eosinophils Absolute: 0.3 10*3/uL (ref 0.0–0.5)
Eosinophils Relative: 3 %
Immature Granulocytes: 0 %
Lymphocytes Relative: 24 %
Lymphs Abs: 2.2 10*3/uL (ref 0.7–4.0)
Monocytes Absolute: 0.7 10*3/uL (ref 0.1–1.0)
Monocytes Relative: 7 %
Neutro Abs: 6.1 10*3/uL (ref 1.7–7.7)
Neutrophils Relative %: 66 %

## 2019-02-11 LAB — I-STAT CHEM 8, ED
BUN: 18 mg/dL (ref 8–23)
Calcium, Ion: 1.15 mmol/L (ref 1.15–1.40)
Chloride: 105 mmol/L (ref 98–111)
Creatinine, Ser: 1.4 mg/dL — ABNORMAL HIGH (ref 0.44–1.00)
Glucose, Bld: 129 mg/dL — ABNORMAL HIGH (ref 70–99)
HCT: 33 % — ABNORMAL LOW (ref 36.0–46.0)
Hemoglobin: 11.2 g/dL — ABNORMAL LOW (ref 12.0–15.0)
Potassium: 4.6 mmol/L (ref 3.5–5.1)
Sodium: 137 mmol/L (ref 135–145)
TCO2: 24 mmol/L (ref 22–32)

## 2019-02-11 LAB — COMPREHENSIVE METABOLIC PANEL
ALT: 13 U/L (ref 0–44)
AST: 21 U/L (ref 15–41)
Albumin: 3.8 g/dL (ref 3.5–5.0)
Alkaline Phosphatase: 63 U/L (ref 38–126)
Anion gap: 11 (ref 5–15)
BUN: 16 mg/dL (ref 8–23)
CO2: 21 mmol/L — ABNORMAL LOW (ref 22–32)
Calcium: 9.6 mg/dL (ref 8.9–10.3)
Chloride: 105 mmol/L (ref 98–111)
Creatinine, Ser: 1.55 mg/dL — ABNORMAL HIGH (ref 0.44–1.00)
GFR calc Af Amer: 37 mL/min — ABNORMAL LOW (ref >60)
GFR calc non Af Amer: 32 mL/min — ABNORMAL LOW (ref >60)
Glucose, Bld: 128 mg/dL — ABNORMAL HIGH (ref 70–99)
Potassium: 4.6 mmol/L (ref 3.5–5.1)
Sodium: 137 mmol/L (ref 135–145)
Total Bilirubin: 0.7 mg/dL (ref 0.3–1.2)
Total Protein: 7.5 g/dL (ref 6.5–8.1)

## 2019-02-11 LAB — TROPONIN I (HIGH SENSITIVITY)
Troponin I (High Sensitivity): 3 ng/L (ref <18)
Troponin I (High Sensitivity): 5 ng/L (ref <18)

## 2019-02-11 LAB — APTT: aPTT: 33 seconds (ref 24–36)

## 2019-02-11 LAB — CBC
HCT: 35.3 % — ABNORMAL LOW (ref 36.0–46.0)
Hemoglobin: 11.4 g/dL — ABNORMAL LOW (ref 12.0–15.0)
MCH: 30.8 pg (ref 26.0–34.0)
MCHC: 32.3 g/dL (ref 30.0–36.0)
MCV: 95.4 fL (ref 80.0–100.0)
Platelets: 287 10*3/uL (ref 150–400)
RBC: 3.7 MIL/uL — ABNORMAL LOW (ref 3.87–5.11)
RDW: 12.2 % (ref 11.5–15.5)
WBC: 9.3 10*3/uL (ref 4.0–10.5)
nRBC: 0 % (ref 0.0–0.2)

## 2019-02-11 LAB — PROTIME-INR
INR: 1 (ref 0.8–1.2)
Prothrombin Time: 13.5 seconds (ref 11.4–15.2)

## 2019-02-11 LAB — SARS CORONAVIRUS 2 BY RT PCR (HOSPITAL ORDER, PERFORMED IN ~~LOC~~ HOSPITAL LAB): SARS Coronavirus 2: NEGATIVE

## 2019-02-11 LAB — CBG MONITORING, ED: Glucose-Capillary: 94 mg/dL (ref 70–99)

## 2019-02-11 MED ORDER — ASPIRIN 300 MG RE SUPP: 300.0000 mg | Freq: Every day | RECTAL | Status: DC

## 2019-02-11 MED ORDER — ACETAMINOPHEN 650 MG RE SUPP: 650.0000 mg | RECTAL | Status: DC | PRN

## 2019-02-11 MED ORDER — ACETAMINOPHEN 160 MG/5ML PO SOLN: 650.0000 mg | ORAL | Status: DC | PRN

## 2019-02-11 MED ORDER — UMECLIDINIUM-VILANTEROL 62.5-25 MCG/INH IN AEPB
2.0000 | INHALATION_SPRAY | Freq: Every day | RESPIRATORY_TRACT | Status: DC
  Administered 2019-02-12: 2 via RESPIRATORY_TRACT
  Filled 2019-02-11: qty 14

## 2019-02-11 MED ORDER — ASPIRIN 325 MG PO TABS: 325.0000 mg | ORAL_TABLET | Freq: Every day | ORAL | Status: DC

## 2019-02-11 MED ORDER — ENOXAPARIN SODIUM 40 MG/0.4ML ~~LOC~~ SOLN: 40.0000 mg | SUBCUTANEOUS | Status: DC

## 2019-02-11 MED ORDER — IOHEXOL 350 MG/ML SOLN
75.0000 mL | Freq: Once | INTRAVENOUS | Status: AC | PRN
  Administered 2019-02-11: 75 mL via INTRAVENOUS

## 2019-02-11 MED ORDER — CLOPIDOGREL BISULFATE 75 MG PO TABS
75.0000 mg | ORAL_TABLET | Freq: Every day | ORAL | Status: DC
  Administered 2019-02-12: 75 mg via ORAL
  Filled 2019-02-11: qty 1

## 2019-02-11 MED ORDER — GABAPENTIN 300 MG PO CAPS
300.0000 mg | ORAL_CAPSULE | Freq: Three times a day (TID) | ORAL | Status: DC
  Administered 2019-02-11 – 2019-02-12 (×2): 300 mg via ORAL
  Filled 2019-02-11 (×2): qty 1

## 2019-02-11 MED ORDER — SODIUM CHLORIDE 0.9% FLUSH
3.0000 mL | Freq: Once | INTRAVENOUS | Status: DC
Start: 2019-02-11 — End: 2019-02-12

## 2019-02-11 MED ORDER — STROKE: EARLY STAGES OF RECOVERY BOOK
Freq: Once | Status: AC
  Administered 2019-02-11: 1
  Filled 2019-02-11: qty 1

## 2019-02-11 MED ORDER — SODIUM CHLORIDE 0.9 % IV BOLUS
1000.0000 mL | Freq: Once | INTRAVENOUS | Status: AC
  Administered 2019-02-11: 1000 mL via INTRAVENOUS

## 2019-02-11 MED ORDER — ROSUVASTATIN CALCIUM 20 MG PO TABS
40.0000 mg | ORAL_TABLET | Freq: Every day | ORAL | Status: DC
  Administered 2019-02-11: 40 mg via ORAL
  Filled 2019-02-11: qty 2

## 2019-02-11 MED ORDER — PANTOPRAZOLE SODIUM 40 MG PO TBEC
40.0000 mg | DELAYED_RELEASE_TABLET | Freq: Every day | ORAL | Status: DC
  Administered 2019-02-12: 40 mg via ORAL
  Filled 2019-02-11: qty 1

## 2019-02-11 MED ORDER — ACETAMINOPHEN 325 MG PO TABS
650.0000 mg | ORAL_TABLET | ORAL | Status: DC | PRN
  Administered 2019-02-11 (×2): 650 mg via ORAL
  Filled 2019-02-11 (×2): qty 2

## 2019-02-11 NOTE — ED Provider Notes (Signed)
[name] EM[facility] Provider Note   CSN: [id number] Arrival date & time: 02/11/19  1342  An emergency department physician performed an initial assessment on this suspected stroke patient at 35.  History   Chief Complaint Chief Complaint  Patient presents with  . Code Stroke    HPI [name] is a 78 y.o. female.     HPI Patient reports that at 1 PM her speech was slurred and her family had difficulty understanding it.  She reports that she felt like her right arm was numb and tingly.  Her leg felt slightly heavy as well.  Symptoms have improved since then.  She reports he still feels a little bit of a numbness and tingling to the side of her face.  She reports her arm is working again and her speech is much better.  He denies she has similar symptoms. Past Medical History:  Diagnosis Date  . 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 Asthma   . 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  . Blood in stool    last week of aug 2018  . Carotid artery occlusion   . Complication of anesthesia   . COPD (chronic obstructive pulmonary disease) ([place])   . Coronary artery disease   . 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 Diverticulitis   . Dyspnea    with exertion  . Gastroesophageal reflux disease    occ  . Glaucoma    right eye  . Headache    tension  . Headache syndrome 11/27/2018  . Hiatal hernia   . Hyperlipidemia   . Hypertension   . Iliac artery aneurysm ([place])   . 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 Myocardial infarction [facility]) 01/01/2000   Cardiac catheterization  . Peripheral vascular disease ([place])    stents in legs x 2 or 3  . Pneumonia    last time winter 2017 -2018  . PONV (postoperative nausea and vomiting)    occassionally, last colonscopy did ok with anesthesia  . Reflux     Patient Active  Problem List   Diagnosis Date Noted  . Headache syndrome 11/27/2018  . GI bleed 07/15/2018  . Occult blood positive stool 07/14/2018  . History of shingles 06/23/2018  . Neuralgia and neuritis 06/23/2018  . Costochondritis, acute 04/18/2018  . Hyperkalemia 02/06/2017  . Physical deconditioning 11/08/2016  . Acute lower GI bleeding   . BRBPR (bright red blood per rectum) 09/15/2016  . Iron deficiency anemia 06/07/2016  . Bronchitis, acute 06/07/2016  . 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 Primary cancer of right lower lobe of lung ([place]) 04/25/2016  . 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 Foot pain, right 10/24/2015  . CAP (community acquired pneumonia) 10/22/2015  . UTI (urinary tract infection) 10/22/2015  . COPD exacerbation ([place]) 10/22/2015  . Neck pain on right side 01/05/2015  . Chest pain 08/02/2014  . Intractable headache 08/02/2014  . HLD (hyperlipidemia) 08/02/2014  . Essential hypertension 08/02/2014  . GERD (gastroesophageal reflux disease) 08/02/2014  . Leg pain, right 08/02/2014  . HA (headache)   . Carotid artery stenosis 12/09/2013  . 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  . Dehydration 04/02/2013  . CAD (coronary artery disease) 04/02/2013  . AKI (acute kidney injury) ([place]) 04/01/2013  . Nausea & vomiting 04/01/2013  . Diarrhea 04/01/2013  . Hypokalemia 04/01/2013  .  Hyponatremia 04/01/2013  . Peripheral vascular disease ([place]) 01/07/2013  . Occlusion and stenosis of carotid artery without mention of cerebral infarction 01/07/2013  . Dizziness and giddiness 01/07/2013  . Shortness of breath 01/07/2013  . Cough 11/17/2012  . COPD GOLD II 11/17/2012    Past Surgical History:  Procedure Laterality Date  . ABDOMINAL HYSTERECTOMY    . APPENDECTOMY    . Arnold-chiari malformation repair  1998   Suboccipital craniectomy  . CAROTID ENDARTERECTOMY  03/29/2010   Left  CEA  . CHOLECYSTECTOMY     Gall Bladder  . COLONOSCOPY WITH PROPOFOL N/A 04/22/2015   Procedure:  COLONOSCOPY WITH PROPOFOL;  Surgeon: Carol Ada, MD;  Location: [facility];  Service: Endoscopy;  Laterality: N/A;  . COLONOSCOPY WITH PROPOFOL N/A 05/25/2016   Procedure: COLONOSCOPY WITH PROPOFOL;  Surgeon: Carol Ada, MD;  Location: [facility];  Service: Endoscopy;  Laterality: N/A;  . COLONOSCOPY WITH PROPOFOL N/A 05/03/2017   Procedure: COLONOSCOPY WITH PROPOFOL;  Surgeon: Carol Ada, MD;  Location: [facility];  Service: Endoscopy;  Laterality: N/A;  . CORNEAL TRANSPLANT     Right  . CORONARY ARTERY BYPASS GRAFT  01/01/2000   x 3  . ENTEROSCOPY N/A 02/27/2018   Procedure: ENTEROSCOPY;  Surgeon: Carol Ada, MD;  Location: [facility];  Service: Endoscopy;  Laterality: N/A;  . ENTEROSCOPY N/A 07/18/2018   Procedure: ENTEROSCOPY;  Surgeon: Carol Ada, MD;  Location: [facility];  Service: Endoscopy;  Laterality: N/A;  . ESOPHAGOGASTRODUODENOSCOPY N/A 05/25/2016   Procedure: ESOPHAGOGASTRODUODENOSCOPY (EGD);  Surgeon: Carol Ada, MD;  Location: [facility];  Service: Endoscopy;  Laterality: N/A;  . ESOPHAGOGASTRODUODENOSCOPY N/A 08/01/2018   Procedure: ESOPHAGOGASTRODUODENOSCOPY (EGD);  Surgeon: Carol Ada, MD;  Location: [facility];  Service: Endoscopy;  Laterality: N/A;  . EYE SURGERY Right 1995 or 1996   Laser surgery for retinal hemorrhage  . GIVENS CAPSULE STUDY N/A 07/16/2018   Procedure: GIVENS CAPSULE STUDY;  Surgeon: Carol Ada, MD;  [facility]: [facility];  Service: Endoscopy;  Laterality: N/A;  . HOT HEMOSTASIS N/A 02/27/2018   Procedure: HOT HEMOSTASIS (ARGON PLASMA COAGULATION/BICAP);  Surgeon: Carol Ada, MD;  Location: [facility];  Service: Endoscopy;  Laterality: N/A;  . HOT HEMOSTASIS N/A 08/01/2018   Procedure: HOT HEMOSTASIS (ARGON PLASMA COAGULATION/BICAP);  Surgeon: Carol Ada, MD;  Location: [facility];  Service: Endoscopy;  Laterality: N/A;  . IR RADIOLOGIST EVAL & MGMT  12/14/2016  . LEFT HEART CATH AND CORS/GRAFTS ANGIOGRAPHY N/A  01/09/2018   Procedure: LEFT HEART CATH AND CORS/GRAFTS ANGIOGRAPHY;  Surgeon: Charolette Forward, MD;  Location: [facility];  Service: Cardiovascular;  Laterality: N/A;  . LEFT HEART CATHETERIZATION WITH CORONARY ANGIOGRAM N/A 08/03/2014   Procedure: LEFT HEART CATHETERIZATION WITH CORONARY ANGIOGRAM;  Surgeon: Birdie Riddle, MD;  Location: [place];  Service: Cardiovascular;  Laterality: N/A;  . Post Coronary Artery  BPG  01/05/2000   Right jugular sheath removed  . PR VEIN BYPASS GRAFT,AORTO-FEM-POP    . ROTATOR CUFF REPAIR     Right     OB History   No obstetric history on file.      Home Medications    Prior to Admission medications   Medication Sig Start Date End Date Taking? Authorizing Provider  acetaminophen (TYLENOL) 325 MG tablet Take 650 mg by mouth every 6 (six) hours as needed for moderate pain or headache.    Yes [provider]  amLODipine-olmesartan (AZOR) 5-40 MG tablet Take 1 tablet by mouth daily.   Yes [provider]  cetirizine (ZYRTEC) 10 MG tablet Take 10 mg by mouth as needed for allergies.   Yes [provider]  clopidogrel (PLAVIX) 75 MG tablet Take 1 tablet (75 mg total) by mouth daily. 04/02/12  Yes Conrad Duran, MD  dexlansoprazole (DEXILANT) 60 MG capsule Take 1 capsule (60 mg total) by mouth daily before breakfast. 11/17/12  Yes Tanda Rockers, MD  ferrous sulfate 325 (65 FE) MG tablet Take 1 tablet (325 mg total) by mouth daily with breakfast. 07/19/18 02/11/19 Yes Alma Friendly, MD  furosemide (LASIX) 20 MG tablet Take 20 mg by mouth daily. 01/20/19  Yes [provider]  gabapentin (NEURONTIN) 300 MG capsule Take 300 mg by mouth 3 (three) times daily. 01/12/19  Yes [provider]  nitroGLYCERIN (NITROSTAT) 0.4 MG SL tablet Place 0.4 mg under the tongue every 5 (five) minutes as needed for chest pain.    Yes [provider]  rosuvastatin (CRESTOR) 10 MG tablet Take 10 mg by mouth at bedtime.     Yes [provider]  spironolactone (ALDACTONE) 25 MG tablet Take 25 mg by mouth daily. 03/25/17  Yes [provider]  umeclidinium-vilanterol (ANORO ELLIPTA) 62.5-25 MCG/INH AEPB Inhale 2 puffs into the lungs daily. 03/14/17  Yes [provider]  gabapentin (NEURONTIN) 100 MG capsule Take 2 capsules (200 mg total) by mouth 3 (three) times daily. Patient not taking: Reported on 02/11/2019 12/10/18   [name], MD  polyethylene glycol [facility] / Floria Raveling) packet Take 17 g by mouth daily. Patient not taking: Reported on 12/09/2018 07/18/18   Alma Friendly, MD    Family History Family History  Problem Relation Age of Onset  . Heart disease Mother        Heart Disease before age 29  . Hypertension Mother   . Hyperlipidemia Mother   . Heart attack Mother   . Clotting disorder Mother   . Heart disease Father        Heart Disease before age 29  . Heart attack Father   . Hyperlipidemia Father   . Hypertension Father   . Heart disease Brother        Heart Disease before age 8  . Hyperlipidemia Brother   . Hypertension Brother   . Clotting disorder Brother   . AAA (abdominal aortic aneurysm) Brother   . Cerebral aneurysm Sister   . Hypertension Sister   . AAA (abdominal aortic aneurysm) Sister   . Asthma Sister   . Cerebral aneurysm Brother   . Cancer Brother        Lung  . Hypertension Brother   . Heart attack Brother   . Heart disease Brother        Aneurysm of Brain  . Hypertension Brother   . Heart disease Brother   . Heart disease Brother   . Stroke Son        Aneurysm of Stomach  . AAA (abdominal aortic aneurysm) Son   . Cancer Maternal Uncle        great uncle/cancer/type unknown    Social History Social History   Tobacco Use  . Smoking status: Former Smoker    Packs/day: 1.50    Years: 30.00    Pack years: 45.00    Types: Cigarettes    Quit date: 08/13/2000    Years since quitting: 18.5  . Smokeless tobacco: Never Used   Substance Use Topics  . Alcohol use: No    Alcohol/week: 0.0 standard  drinks  . Drug use: No     Allergies   Azithromycin, Codeine, Doxycycline, Hydromorphone, Levaquin [levofloxacin], Avelox [moxifloxacin hcl in nacl], Oxycodone-acetaminophen, and Risedronate   Review of Systems Review of Systems 10 Systems reviewed and are negative for acute change except as noted in the HPI.   Physical Exam Updated Vital Signs BP 117/81   Pulse 67   Temp 97.9 F (36.6 C) (Oral)   Resp 18   Ht 5\' 2"  (1.575 m)   Wt 76.5 kg   SpO2 95%   BMI 30.85 kg/m   Physical Exam Constitutional:      Appearance: She is well-developed.  HENT:     Head: Normocephalic and atraumatic.     Mouth/Throat:     Pharynx: Oropharynx is clear.  Eyes:     Extraocular Movements: Extraocular movements intact.  Neck:     Musculoskeletal: Neck supple.  Cardiovascular:     Rate and Rhythm: Normal rate and regular rhythm.     Heart sounds: Normal heart sounds.  Pulmonary:     Effort: Pulmonary effort is normal.     Breath sounds: Normal breath sounds.  Abdominal:     General: Bowel sounds are normal. There is no distension.     Palpations: Abdomen is soft.     Tenderness: There is no abdominal tenderness.  Musculoskeletal: Normal range of motion.  Skin:    General: Skin is warm and dry.  Neurological:     General: No focal deficit present.     Mental Status: She is alert and oriented to person, place, and time.     GCS: GCS eye subscore is 4. GCS verbal subscore is 5. GCS motor subscore is 6.     Coordination: Coordination normal.  Psychiatric:        Mood and Affect: Mood normal.      ED Treatments / Results  Labs (all labs ordered are listed, but only abnormal results are displayed) Labs Reviewed  CBC - Abnormal; Notable for the following components:      Result Value   RBC 3.70 (*)    Hemoglobin 11.4 (*)    HCT 35.3 (*)    All other components within normal limits  COMPREHENSIVE  METABOLIC PANEL - Abnormal; Notable for the following components:   CO2 21 (*)    Glucose, Bld 128 (*)    Creatinine, Ser 1.55 (*)    GFR calc non Af Amer 32 (*)    GFR calc Af Amer 37 (*)    All other components within normal limits  I-STAT CHEM 8, ED - Abnormal; Notable for the following components:   Creatinine, Ser 1.40 (*)    Glucose, Bld 129 (*)    Hemoglobin 11.2 (*)    HCT 33.0 (*)    All other components within normal limits  PROTIME-INR  APTT  DIFFERENTIAL  TROPONIN I (HIGH SENSITIVITY)  TROPONIN I (HIGH SENSITIVITY)  CBG MONITORING, ED    EKG EKG Interpretation  Date/Time:  Wednesday February 11 2019 14:18:56 EDT Ventricular Rate:  70 PR Interval:    QRS Duration: 83 QT Interval:  377 QTC Calculation: 407 R Axis:   68 Text Interpretation:  Sinus rhythm no changes from previous Confirmed by Charlesetta Shanks [phone]) on 02/11/2019 2:36:09 PM Also confirmed by Charlesetta Shanks [phone]), editor Hattie Perch ([id number])  on 02/11/2019 4:09:42 PM   Radiology Ct Angio Head W Or Wo Contrast  Result Date: 02/11/2019 CLINICAL DATA:  Right facial numbness.  Right arm  weakness. EXAM: CT ANGIOGRAPHY HEAD AND NECK TECHNIQUE: Multidetector CT imaging of the head and neck was performed using the standard protocol during bolus administration of intravenous contrast. Multiplanar CT image reconstructions and MIPs were obtained to evaluate the vascular anatomy. Carotid stenosis measurements (when applicable) are obtained utilizing NASCET criteria, using the distal internal carotid diameter as the denominator. CONTRAST:  59mL OMNIPAQUE IOHEXOL 350 MG/ML SOLN COMPARISON:  Head MRA 12/09/2018.  Head and neck CTA 02/17/2016. FINDINGS: CTA NECK FINDINGS Aortic arch: Normal variant aortic arch branching pattern with common origin of the brachiocephalic and left common carotid arteries. Mild arch atherosclerosis without significant arch vessel origin stenosis. Right carotid system: Patent with moderate  soft and calcified plaque in the carotid bulb resulting in less than 50% stenosis. Left carotid system: Patent without stenosis or dissection status post endarterectomy. Vertebral arteries: Patent with the left vertebral artery being moderately dominant. Moderate to severe right vertebral artery origin stenosis due to calcified plaque, stable to mildly progressed from 2017. Skeleton: Mild cervical disc degeneration. Other neck: No evidence of cervical lymphadenopathy or mass. Upper chest: Mild biapical pleuroparenchymal scarring. Review of the MIP images confirms the above findings CTA HEAD FINDINGS Anterior circulation: The internal carotid arteries are patent from skull base to carotid termini with unchanged mild right cavernous stenosis compared to 2017. An infundibulum at the right posterior communicating origin is unchanged. A 2 mm superiorly directed outpouching at the right MCA bifurcation is unchanged. ACAs and MCAs are patent without evidence of proximal branch occlusion or significant proximal stenosis. Posterior circulation: The intracranial vertebral arteries are widely patent to the basilar. The basilar artery is widely patent a patent right PICA, bilateral AICAs, and bilateral SCAs are identified. The basilar artery is widely patent. There is a small right posterior communicating artery. Both PCAs are patent with branch vessel irregularity but no significant proximal stenosis. No aneurysm is identified. Venous sinuses: Patent. Anatomic variants: None. Review of the MIP images confirms the above findings IMPRESSION: 1. No emergent large vessel occlusion. 2. Mild intracranial atherosclerosis without flow limiting proximal stenosis. 3. 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 4. Predominantly right-sided cervical carotid artery atherosclerosis without significant stenosis. Previous left carotid endarterectomy. 5. Moderate to severe right vertebral artery origin stenosis, stable to mildly progressed  from 2017. 6.  Aortic Atherosclerosis (ICD10-I70.0). These results were communicated to Dr. Cheral Marker at 2:58 p.m.on 02/11/2019 by text page via the [facility] messaging system. Electronically Signed   By: Logan Bores M.D.   On: 02/11/2019 15:46   Ct Angio Neck W Or Wo Contrast  Result Date: 02/11/2019 CLINICAL DATA:  Right facial numbness.  Right arm weakness. EXAM: CT ANGIOGRAPHY HEAD AND NECK TECHNIQUE: Multidetector CT imaging of the head and neck was performed using the standard protocol during bolus administration of intravenous contrast. Multiplanar CT image reconstructions and MIPs were obtained to evaluate the vascular anatomy. Carotid stenosis measurements (when applicable) are obtained utilizing NASCET criteria, using the distal internal carotid diameter as the denominator. CONTRAST:  70mL OMNIPAQUE IOHEXOL 350 MG/ML SOLN COMPARISON:  Head MRA 12/09/2018.  Head and neck CTA 02/17/2016. FINDINGS: CTA NECK FINDINGS Aortic arch: Normal variant aortic arch branching pattern with common origin of the brachiocephalic and left common carotid arteries. Mild arch atherosclerosis without significant arch vessel origin stenosis. Right carotid system: Patent with moderate soft and calcified plaque in the carotid bulb resulting in less than 50% stenosis. Left carotid system: Patent without stenosis or dissection status post endarterectomy. Vertebral arteries: Patent with  the left vertebral artery being moderately dominant. Moderate to severe right vertebral artery origin stenosis due to calcified plaque, stable to mildly progressed from 2017. Skeleton: Mild cervical disc degeneration. Other neck: No evidence of cervical lymphadenopathy or mass. Upper chest: Mild biapical pleuroparenchymal scarring. Review of the MIP images confirms the above findings CTA HEAD FINDINGS Anterior circulation: The internal carotid arteries are patent from skull base to carotid termini with unchanged mild right cavernous stenosis compared to  2017. An infundibulum at the right posterior communicating origin is unchanged. A 2 mm superiorly directed outpouching at the right MCA bifurcation is unchanged. ACAs and MCAs are patent without evidence of proximal branch occlusion or significant proximal stenosis. Posterior circulation: The intracranial vertebral arteries are widely patent to the basilar. The basilar artery is widely patent a patent right PICA, bilateral AICAs, and bilateral SCAs are identified. The basilar artery is widely patent. There is a small right posterior communicating artery. Both PCAs are patent with branch vessel irregularity but no significant proximal stenosis. No aneurysm is identified. Venous sinuses: Patent. Anatomic variants: None. Review of the MIP images confirms the above findings IMPRESSION: 1. No emergent large vessel occlusion. 2. Mild intracranial atherosclerosis without flow limiting proximal stenosis. 3. 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 4. Predominantly right-sided cervical carotid artery atherosclerosis without significant stenosis. Previous left carotid endarterectomy. 5. 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 6.  Aortic Atherosclerosis (ICD10-I70.0). These results were communicated to Dr. Cheral Marker at 2:58 p.m.on 02/11/2019 by text page via the [facility] messaging system. Electronically Signed   By: Logan Bores M.D.   On: 02/11/2019 15:46   Dg Chest Port 1 View  Result Date: 02/11/2019 CLINICAL DATA:  Initial evaluation for acute chest pain. EXAM: PORTABLE CHEST 1 VIEW COMPARISON:  Prior CT from 08/25/2018 FINDINGS: Median sternotomy wires underlying CABG markers and surgical clips noted. Transverse heart size within normal limits. Mediastinal silhouette normal. Lungs normally inflated. Mild streaky right basilar opacity, presumably reflecting chronic post radiation changes as previously seen. No other focal airspace disease. No pulmonary edema or  pleural effusion. No pneumothorax. Mild irregular biapical pleural thickening noted. No acute osseous abnormality. IMPRESSION: 1. Mild streaky right basilar opacity, most likely reflecting chronic post radiation changes as seen on previous CT. 2. No other active cardiopulmonary disease. Electronically Signed   By: Jeannine Boga M.D.   On: 02/11/2019 15:14   Ct Head Code Stroke Wo Contrast  Result Date: 02/11/2019 CLINICAL DATA:  Code stroke.  Right-sided weakness. EXAM: CT HEAD WITHOUT CONTRAST TECHNIQUE: Contiguous axial images were obtained from the base of the skull through the vertex without intravenous contrast. COMPARISON:  Brain MRI 12/09/2018 FINDINGS: Brain: There is no evidence of acute infarct, intracranial hemorrhage, mass, midline shift, or extra-axial fluid collection. Mild cerebral atrophy is within normal limits for age. Vascular: Calcified atherosclerosis at the skull base. No hyperdense vessel. Skull: Previous suboccipital craniectomy. No fracture or suspicious osseous lesion. Sinuses/Orbits: Paranasal sinuses and mastoid air cells are clear. Right cataract extraction. Other: None. ASPECTS [facility] Stroke Program Early CT Score) - Ganglionic level infarction (caudate, lentiform nuclei, internal capsule, insula, M1-M3 cortex): 7 - Supraganglionic infarction (M4-M6 cortex): 3 Total score (0-10 with 10 being normal): 10 IMPRESSION: 1. Unremarkable CT appearance of the brain for age. 2. ASPECTS is 10. These results were called by telephone at the time of interpretation on 02/11/2019 at 2:14 pm to Dr. Kerney Elbe, who verbally acknowledged these results. Electronically Signed  By: Logan Bores M.D.   On: 02/11/2019 14:15    Procedures Procedures (including critical care time)  Medications Ordered in ED Medications  sodium chloride flush (NS) 0.9 % injection 3 mL (has no administration in time range)  iohexol (OMNIPAQUE) 350 MG/ML injection 75 mL (75 mLs Intravenous Contrast Given  02/11/19 1443)  sodium chloride 0.9 % bolus 1,000 mL (1,000 mLs Intravenous New Bag/Given 02/11/19 1557)     Initial Impression / Assessment and Plan / ED Course  I have reviewed the triage vital signs and the nursing notes.  Pertinent labs & imaging results that were available during my care of the patient were reviewed by me and considered in my medical decision making (see chart for details).       Presents with resolving neurologic symptoms.  She had significantly slurred speech by report and concurrent right arm numbness and tingling.  Patient arrives as a code stroke.  He is stable on arrival with clear mental status and normal speech.  Proceeds to the CT scan and neurology consultation.  Patient did at one point developed complaint of chest pain.  She qualified as mild.  EKG reviewed and normal.  Troponin added.  At this time low suspicion for acute ischemic event.  Lan for admission for stroke work-up.  Final Clinical Impressions(s) / ED Diagnoses   Final diagnoses:  Cerebral infarction, unspecified mechanism [facility])    ED Discharge Orders    None       Charlesetta Shanks, MD 02/11/19 [phone]

## 2019-02-11 NOTE — Code Documentation (Signed)
78 yo female coming from home where she lives with family. Pt has hx of CKD and lung cancer. Reports LKW at 1300 when she had a sudden onset of right sided numbness and arm weakness with some dysarthria. Pt called her daughter and she drove her to the ED. Triage notified and activated a Code Stroke upon arrival. Stroke Team met patient in CT. Initial NIHSS 2 for slight right arm drift and right facial sensory decreased. CT Head negative for hemorrhage. Pt taken back to ED 23. Placed on the cardiac monitor. VS stable. NSR. Pt deemed not a tPA candidate at this time due to being too mild to treat. Remains in the tPA window until 1730. Pt started to complain of chest pain at 1440. EDP made aware and EKG taken. Chest pain subsided shortly. Neurologist ordered CTA for patient and patient taken back to CT. CTA negative for LVO. See Stroke Timeline and NIHSS for details. Handoff given to [place], Therapist, sports.

## 2019-02-11 NOTE — Consult Note (Signed)
Neurology Consultation  Reason for Consult: Code stroke  Referring Physician: Dr. Johnney Killian  CC: Right face, arm, leg weakness and decreased sensation  History is obtained from: Patient  HPI: [name] is a 78 y.o. female with history of PVD, MI, lung cancer, hypertension, hyperlipidemia, glaucoma, carotid artery occlusion and bilateral occipital neuralgia.  Patient states that her last normal was 1300 hrs.  Patient noticed that she had some decreased vision in her right eye along with tingling in her right face which went down to her arm and leg on the right.  She also felt that her right arm and leg felt heavy.  In addition, she had a headache along with dizziness.  Patient called her cardiologist who recommended that she take an aspirin which she did.  She called EMS and was brought as a code stroke to [facility] ED.  At time of initial exam she still had right facial decreased sensation; however, her slurred speech had resolved and she felt as though the weakness had resolved on the right side.  ED course: CT head and labs--then CTA head and neck   LKW: 1300 tpa given?: No, relatively minor deficits. Patient declined tPA after discussion of risks/benefits Premorbid modified Rankin scale (mRS): 0 NIHSS-2  ROS:  + nausea + Chest discomfort + Decreased vision + HA ROS otherwise as per HPI, with all other systems negative.   Past Medical History:  Diagnosis Date  . 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 Asthma   . 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  . Blood in stool    last week of aug 2018  . Carotid artery occlusion   . Complication of anesthesia   . COPD (chronic obstructive pulmonary disease) ([place])   . Coronary artery disease   . 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 Diverticulitis   . Dyspnea    with exertion  . Gastroesophageal reflux disease    occ  . Glaucoma     right eye  . Headache    tension  . Headache syndrome 11/27/2018  . Hiatal hernia   . Hyperlipidemia   . Hypertension   . Iliac artery aneurysm ([place])   . 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 Myocardial infarction [facility]) 01/01/2000   Cardiac catheterization  . Peripheral vascular disease ([place])    stents in legs x 2 or 3  . Pneumonia    last time winter 2017 -2018  . PONV (postoperative nausea and vomiting)    occassionally, last colonscopy did ok with anesthesia  . Reflux      Family History  Problem Relation Age of Onset  . Heart disease Mother        Heart Disease before age 51  . Hypertension Mother   . Hyperlipidemia Mother   . Heart attack Mother   . Clotting disorder Mother   . Heart disease Father        Heart Disease before age 88  . Heart attack Father   . Hyperlipidemia Father   . Hypertension Father   . Heart disease Brother        Heart Disease before age 64  . Hyperlipidemia Brother   . Hypertension Brother   . Clotting disorder Brother   . AAA (abdominal aortic aneurysm) Brother   . Cerebral aneurysm Sister   . Hypertension Sister   .  AAA (abdominal aortic aneurysm) Sister   . Asthma Sister   . Cerebral aneurysm Brother   . Cancer Brother        Lung  . Hypertension Brother   . Heart attack Brother   . Heart disease Brother        Aneurysm of Brain  . Hypertension Brother   . Heart disease Brother   . Heart disease Brother   . Stroke Son        Aneurysm of Stomach  . AAA (abdominal aortic aneurysm) Son   . Cancer Maternal Uncle        great uncle/cancer/type unknown   Social History:   reports that she quit smoking about 18 years ago. Her smoking use included cigarettes. She has a 45.00 pack-year smoking history. She has never used smokeless tobacco. She reports that she does not drink alcohol or use drugs.  Medications  Current Facility-Administered Medications:  .  sodium chloride flush (NS) 0.9  % injection 3 mL, 3 mL, Intravenous, Once, Pattricia Boss, MD  Current Outpatient Medications:  .  acetaminophen (TYLENOL) 325 MG tablet, Take 650 mg by mouth every 6 (six) hours as needed for moderate pain or headache. , Disp: , Rfl:  .  amLODipine-olmesartan (AZOR) 5-40 MG tablet, Take 1 tablet by mouth daily., Disp: , Rfl:  .  cetirizine (ZYRTEC) 10 MG tablet, Take 10 mg by mouth as needed for allergies., Disp: , Rfl:  .  clopidogrel (PLAVIX) 75 MG tablet, Take 1 tablet (75 mg total) by mouth daily., Disp: 90 tablet, Rfl: 3 .  dexlansoprazole (DEXILANT) 60 MG capsule, Take 1 capsule (60 mg total) by mouth daily before breakfast., Disp: 30 capsule, Rfl: 11 .  ferrous sulfate 325 (65 FE) MG tablet, Take 1 tablet (325 mg total) by mouth daily with breakfast., Disp: 30 tablet, Rfl: 0 .  gabapentin (NEURONTIN) 100 MG capsule, Take 2 capsules (200 mg total) by mouth 3 (three) times daily., Disp: 180 capsule, Rfl: 3 .  nitroGLYCERIN (NITROSTAT) 0.4 MG SL tablet, Place 0.4 mg under the tongue every 5 (five) minutes as needed for chest pain. , Disp: , Rfl:  .  polyethylene glycol (MIRALAX / GLYCOLAX) packet, Take 17 g by mouth daily. (Patient not taking: Reported on 12/09/2018), Disp: 14 each, Rfl: 0 .  prednisoLONE acetate (PRED FORTE) 1 % ophthalmic suspension, Place 1 drop into the right eye every other day. , Disp: , Rfl:  .  rosuvastatin (CRESTOR) 10 MG tablet, Take 10 mg by mouth at bedtime. , Disp: , Rfl:  .  spironolactone (ALDACTONE) 25 MG tablet, Take 25 mg by mouth daily., Disp: , Rfl:  .  umeclidinium-vilanterol (ANORO ELLIPTA) 62.5-25 MCG/INH AEPB, Inhale 2 puffs into the lungs daily., Disp: , Rfl:   Facility-Administered Medications Ordered in Other Encounters:  .  promethazine (PHENERGAN) injection 6.25-12.5 mg, 6.25-12.5 mg, Intravenous, Q15 min PRN, Daiva Huge, Leanord Hawking, MD   Exam: Current vital signs: BP (!) 149/73 (BP Location: Right Arm)   Pulse 69   Temp 97.9 F (36.6 C) (Oral)    Resp 18   SpO2 100%  Vital signs in last 24 hours: Temp:  [97.9 F (36.6 C)] 97.9 F (36.6 C) (07/01 1354) Pulse Rate:  [69] 69 (07/01 1354) Resp:  [18] 18 (07/01 1354) BP: (149)/(73) 149/73 (07/01 1354) SpO2:  [100 %] 100 % (07/01 1354)  Physical Exam  Constitutional: Appears well-developed and well-nourished.  Psych: Affect appropriate to situation Eyes: No scleral injection HENT: No  OP obstrucion Head: Normocephalic.  Cardiovascular: Normal rate and regular rhythm.  Respiratory: Effort normal, non-labored breathing GI: Soft.  No distension. There is no tenderness.  Skin: WDI  Neuro: Mental Status: Patient is awake, alert, oriented to person, place, month, year, and situation. Patient is able to give a clear and coherent history. No signs of aphasia or neglect Cranial Nerves: II: Decreased vision in right temporal crescentic distribution OD, but also has had retinal surgery in right eye. Constricted visual fields in all 4 quadrants of left eye.   III,IV, VI: EOMI without ptosis or diploplia. Pupils equal, round and reactive to light V: decreased sensation to the right face VII: Facial movement is symmetric.  VIII: hearing is intact to voice X: Palate elevates symmetrically XI: Shoulder shrug is symmetric. XII: tongue is midline without atrophy or fasciculations.  Motor: Tone is normal. Bulk is normal. 5/5 strength was present to LUE and LLE as well as RUE.  RLE with 4+/5 knee flexion and hip flexion Sensory: Sensation hypoethesia to FT on the right. Hyperesthesia to temp on the right. Extinction on the right to DSS.  Deep Tendon Reflexes: 2+ and symmetric in the biceps and patellae.  Plantars: Toes are downgoing bilaterally.  Cerebellar: FNF and HKS are intact bilaterally   Labs I have reviewed labs in epic and the results pertinent to this consultation are:   CBC    Component Value Date/Time   WBC 9.3 02/11/2019 1354   RBC 3.70 (L) 02/11/2019 1354   HGB  11.2 (L) 02/11/2019 1401   HGB 10.4 (L) 08/25/2018 0805   HGB 9.9 (L) 08/02/2017 0821   HCT 33.0 (L) 02/11/2019 1401   HCT 29.8 (L) 08/02/2017 0821   PLT 287 02/11/2019 1354   PLT 276 08/25/2018 0805   PLT 218 08/02/2017 0821   MCV 95.4 02/11/2019 1354   MCV 92.3 08/02/2017 0821   MCH 30.8 02/11/2019 1354   MCHC 32.3 02/11/2019 1354   RDW 12.2 02/11/2019 1354   RDW 14.4 08/02/2017 0821   LYMPHSABS 2.2 02/11/2019 1354   LYMPHSABS 2.6 08/02/2017 0821   MONOABS 0.7 02/11/2019 1354   MONOABS 0.5 08/02/2017 0821   EOSABS 0.3 02/11/2019 1354   EOSABS 0.2 08/02/2017 0821   BASOSABS 0.0 02/11/2019 1354   BASOSABS 0.0 08/02/2017 0821    CMP     Component Value Date/Time   NA 137 02/11/2019 1401   NA 139 08/02/2017 0821   K 4.6 02/11/2019 1401   K 4.3 08/02/2017 0821   CL 105 02/11/2019 1401   CO2 25 08/25/2018 0805   CO2 24 08/02/2017 0821   GLUCOSE 129 (H) 02/11/2019 1401   GLUCOSE 100 08/02/2017 0821   BUN 18 02/11/2019 1401   BUN 14.4 08/02/2017 0821   CREATININE 1.40 (H) 02/11/2019 1401   CREATININE 1.47 (H) 08/25/2018 0805   CREATININE 1.3 (H) 08/02/2017 0821   CALCIUM 9.9 08/25/2018 0805   CALCIUM 9.2 08/02/2017 0821   PROT 7.8 08/25/2018 0805   PROT 7.1 08/02/2017 0821   ALBUMIN 3.8 08/25/2018 0805   ALBUMIN 3.5 08/02/2017 0821   AST 17 08/25/2018 0805   AST 14 08/02/2017 0821   ALT 10 08/25/2018 0805   ALT 8 08/02/2017 0821   ALKPHOS 70 08/25/2018 0805   ALKPHOS 66 08/02/2017 0821   BILITOT <0.2 (L) 08/25/2018 0805   BILITOT 0.34 08/02/2017 0821   GFRNONAA 35 (L) 08/25/2018 0805   GFRAA 40 (L) 08/25/2018 0805    Lipid Panel  Component Value Date/Time   CHOL 153 08/04/2014 0313   TRIG 57 08/04/2014 0313   HDL 49 08/04/2014 0313   CHOLHDL 3.1 08/04/2014 0313   VLDL 11 08/04/2014 0313   LDLCALC 93 08/04/2014 0313     Imaging I have reviewed the images obtained:  CT-scan of the brain-showed no acute abnormalities  CTA of head and neck- no  LVO  MRI examination of the brain: Pending  Natalie Quill PA-C [facility] [phone] 02/11/2019, 2:27 PM   Assessment: 78 year old female presenting with new onset of right sided numbness and weakness 1. NIHSS of 3 for sensory change, right sided extinction and mild/subtle right upper extremity drift.  2. Exam findings best localize to the left parietofrontal region. CT with no acute abnormality.  3. Patient expressed her wish not to be treated with tPA after full risks/benefits of tPA administration versus no tPA were discussed.  4. CTA shows no LVO. There is prominent stenosis of the right ICA at its origin.  5. Stroke risk factors: CAD, HLD, HTN  Recommendations: 1. Carotid ultrasound for further assessment of right ICA stenosis. Degree of narrowing appears significant on CTA and may be a candidate for CEA despite that side being asymptomatic. Would obtain vascular surgery consult if carotid ultrasound reveals > 60% right ICA stenosis.  2. TTE 3. MRI brain  4. Cardiac telemetry 5. PT/OT/Speech 6. IVF 7. Permissive HTN x 24 hours 8. Add ASA to Plavix for short term. Has history of GI bleeding with ASA. 9. Continue Crestor   Electronically signed: Dr. Kerney Elbe

## 2019-02-11 NOTE — ED Notes (Signed)
Unable to take report at this time per 3W

## 2019-02-11 NOTE — H&P (Signed)
[facility] History and Physical   Patient: [name] DTO:[id number]   PCP: [name], MD DOB: 1940/12/21   DOA: 02/11/2019   DOS: 02/11/2019   DOS: the patient was seen and examined on 02/11/2019  Patient coming from: The patient is coming from Home  Chief Complaint: Right-sided numbness  HPI: [name] is a 78 y.o. female with Past medical history of CAD, COPD, carotid stenosis, GERD and GI bleed. Patient presents with complaints of resident numbness.  She also had right eye vision loss. Also had some tingling as well. Felt her right arm and right leg were heavy as well. Symptoms started around 1 PM. By the time she was seen in the emergency room her symptoms were resolved. At the time of my evaluation she did not have any acute complaint. Rapidity of the improvement as the patient was considered not a candidate for TPA. Denies any nausea or vomiting denies any falls or trauma or injury. Reports that she actually had similar numbness on and off in last 1 week and actually has discussed with her PCP who recommended MRI outpatient. No fever no chills reported.  No cough.  No shortness of breath.  ED Course: Patient arrived as a code stroke.  Neurology aware to the patient.  Patient felt patient not a candidate for TPA. CT head unremarkable as well.  Referred for admission for further work-up.  At her baseline ambulates with support And is independent for most of her ADL; manages her medication on her own.  Review of Systems: as mentioned in the history of present illness.  All other systems reviewed and are negative.  Past Medical History:  Diagnosis Date  . 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 Asthma   . 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  . Blood in stool    last week of aug 2018  . Carotid artery occlusion   . Complication of anesthesia   . COPD (chronic  obstructive pulmonary disease) ([place])   . Coronary artery disease   . 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 Diverticulitis   . Dyspnea    with exertion  . Gastroesophageal reflux disease    occ  . Glaucoma    right eye  . Headache    tension  . Headache syndrome 11/27/2018  . Hiatal hernia   . Hyperlipidemia   . Hypertension   . Iliac artery aneurysm ([place])   . Lung cancer ([place]) dx 2018   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  . Myocardial infarction [facility]) 01/01/2000   Cardiac catheterization  . Peripheral vascular disease ([place])    stents in legs x 2 or 3  . Pneumonia    last time winter 2017 -2018  . PONV (postoperative nausea and vomiting)    occassionally, last colonscopy did ok with anesthesia  . Reflux    Past Surgical History:  Procedure Laterality Date  . ABDOMINAL HYSTERECTOMY    . APPENDECTOMY    . Arnold-chiari malformation repair  1998   Suboccipital craniectomy  . CAROTID ENDARTERECTOMY  03/29/2010   Left  CEA  . CHOLECYSTECTOMY     Gall Bladder  . COLONOSCOPY WITH PROPOFOL N/A 04/22/2015   Procedure: COLONOSCOPY WITH PROPOFOL;  Surgeon: Carol Ada, MD;  Location: [facility] ENDOSCOPY;  Service: Endoscopy;  Laterality: N/A;  . COLONOSCOPY WITH PROPOFOL N/A 05/25/2016   Procedure: COLONOSCOPY WITH PROPOFOL;  Surgeon: Saralyn Pilar  Benson Norway, MD;  Location: [facility];  Service: Endoscopy;  Laterality: N/A;  . COLONOSCOPY WITH PROPOFOL N/A 05/03/2017   Procedure: COLONOSCOPY WITH PROPOFOL;  Surgeon: Carol Ada, MD;  Location: [facility];  Service: Endoscopy;  Laterality: N/A;  . CORNEAL TRANSPLANT     Right  . CORONARY ARTERY BYPASS GRAFT  01/01/2000   x 3  . ENTEROSCOPY N/A 02/27/2018   Procedure: ENTEROSCOPY;  Surgeon: Carol Ada, MD;  Location: [facility];  Service: Endoscopy;  Laterality: N/A;  . ENTEROSCOPY N/A 07/18/2018   Procedure: ENTEROSCOPY;  Surgeon: Carol Ada, MD;  Location: [facility];  Service: Endoscopy;  Laterality: N/A;  .  ESOPHAGOGASTRODUODENOSCOPY N/A 05/25/2016   Procedure: ESOPHAGOGASTRODUODENOSCOPY (EGD);  Surgeon: Carol Ada, MD;  Location: [facility];  Service: Endoscopy;  Laterality: N/A;  . ESOPHAGOGASTRODUODENOSCOPY N/A 08/01/2018   Procedure: ESOPHAGOGASTRODUODENOSCOPY (EGD);  Surgeon: Carol Ada, MD;  Location: [facility];  Service: Endoscopy;  Laterality: N/A;  . EYE SURGERY Right 1995 or 1996   Laser surgery for retinal hemorrhage  . GIVENS CAPSULE STUDY N/A 07/16/2018   Procedure: GIVENS CAPSULE STUDY;  Surgeon: Carol Ada, MD;  Location: [facility];  Service: Endoscopy;  Laterality: N/A;  . HOT HEMOSTASIS N/A 02/27/2018   Procedure: HOT HEMOSTASIS (ARGON PLASMA COAGULATION/BICAP);  Surgeon: Carol Ada, MD;  Location: [facility];  Service: Endoscopy;  Laterality: N/A;  . HOT HEMOSTASIS N/A 08/01/2018   Procedure: HOT HEMOSTASIS (ARGON PLASMA COAGULATION/BICAP);  Surgeon: Carol Ada, MD;  Location: [facility];  Service: Endoscopy;  Laterality: N/A;  . IR RADIOLOGIST EVAL & MGMT  12/14/2016  . LEFT HEART CATH AND CORS/GRAFTS ANGIOGRAPHY N/A 01/09/2018   Procedure: LEFT HEART CATH AND CORS/GRAFTS ANGIOGRAPHY;  Surgeon: Charolette Forward, MD;  Location: [place] CV LAB;  Service: Cardiovascular;  Laterality: N/A;  . LEFT HEART CATHETERIZATION WITH CORONARY ANGIOGRAM N/A 08/03/2014   Procedure: LEFT HEART CATHETERIZATION WITH CORONARY ANGIOGRAM;  Surgeon: Birdie Riddle, MD;  Location: [place] CATH LAB;  Service: Cardiovascular;  Laterality: N/A;  . Post Coronary Artery  BPG  01/05/2000   Right jugular sheath removed  . PR VEIN BYPASS GRAFT,AORTO-FEM-POP    . ROTATOR CUFF REPAIR     Right   Social History:  reports that she quit smoking about 18 years ago. Her smoking use included cigarettes. She has a 45.00 pack-year smoking history. She has never used smokeless tobacco. She reports that she does not drink alcohol or use drugs.  Allergies  Allergen Reactions  . Azithromycin Swelling     Patient reported past history of lip swelling  . Codeine Other (See Comments)    Dr. Terrence Dupont advised patient not to take this medication  . Doxycycline Swelling    Mouth, lips, feet swelling  . Hydromorphone Palpitations and Other (See Comments)    DILAUDID  -  Pt had a Heart Attack after taking Dilaudid.  . Levaquin [Levofloxacin] Other (See Comments)    Chest pressure, SOB, "pain in between shoulder blades", sweaty -as reported by patient per experience in ED this afternoon  . Avelox [Moxifloxacin Hcl In Nacl] Palpitations    Caused Heart Attack   . Oxycodone-Acetaminophen Other (See Comments)    Says it makes her feel weird  . Risedronate Other (See Comments)    Chest pain    Family History  Problem Relation Age of Onset  . Heart disease Mother        Heart Disease before age 69  . Hypertension Mother   . Hyperlipidemia Mother   .  Heart attack Mother   . Clotting disorder Mother   . Heart disease Father        Heart Disease before age 35  . Heart attack Father   . Hyperlipidemia Father   . Hypertension Father   . Heart disease Brother        Heart Disease before age 17  . Hyperlipidemia Brother   . Hypertension Brother   . Clotting disorder Brother   . AAA (abdominal aortic aneurysm) Brother   . Cerebral aneurysm Sister   . Hypertension Sister   . AAA (abdominal aortic aneurysm) Sister   . Asthma Sister   . Cerebral aneurysm Brother   . Cancer Brother        Lung  . Hypertension Brother   . Heart attack Brother   . Heart disease Brother        Aneurysm of Brain  . Hypertension Brother   . Heart disease Brother   . Heart disease Brother   . Stroke Son        Aneurysm of Stomach  . AAA (abdominal aortic aneurysm) Son   . Cancer Maternal Uncle        great uncle/cancer/type unknown     Prior to Admission medications   Medication Sig Start Date End Date Taking? Authorizing Provider  acetaminophen (TYLENOL) 325 MG tablet Take 650 mg by mouth every 6 (six)  hours as needed for moderate pain or headache.    Yes [provider]  amLODipine-olmesartan (AZOR) 5-40 MG tablet Take 1 tablet by mouth daily.   Yes [provider]  cetirizine (ZYRTEC) 10 MG tablet Take 10 mg by mouth as needed for allergies.   Yes [provider]  clopidogrel (PLAVIX) 75 MG tablet Take 1 tablet (75 mg total) by mouth daily. 04/02/12  Yes Conrad Walker, MD  dexlansoprazole (DEXILANT) 60 MG capsule Take 1 capsule (60 mg total) by mouth daily before breakfast. 11/17/12  Yes Tanda Rockers, MD  ferrous sulfate 325 (65 FE) MG tablet Take 1 tablet (325 mg total) by mouth daily with breakfast. 07/19/18 02/11/19 Yes Alma Friendly, MD  furosemide (LASIX) 20 MG tablet Take 20 mg by mouth daily. 01/20/19  Yes [provider]  gabapentin (NEURONTIN) 300 MG capsule Take 300 mg by mouth 3 (three) times daily. 01/12/19  Yes [provider]  nitroGLYCERIN (NITROSTAT) 0.4 MG SL tablet Place 0.4 mg under the tongue every 5 (five) minutes as needed for chest pain.    Yes [provider]  rosuvastatin (CRESTOR) 10 MG tablet Take 10 mg by mouth at bedtime.    Yes [provider]  spironolactone (ALDACTONE) 25 MG tablet Take 25 mg by mouth daily. 03/25/17  Yes [provider]  umeclidinium-vilanterol (ANORO ELLIPTA) 62.5-25 MCG/INH AEPB Inhale 2 puffs into the lungs daily. 03/14/17  Yes [provider]  gabapentin (NEURONTIN) 100 MG capsule Take 2 capsules (200 mg total) by mouth 3 (three) times daily. Patient not taking: Reported on 02/11/2019 12/10/18   [name], MD  polyethylene glycol [facility] / Floria Raveling) packet Take 17 g by mouth daily. Patient not taking: Reported on 12/09/2018 07/18/18   Alma Friendly, MD    Physical Exam: Vitals:   02/11/19 1630 02/11/19 1700 02/11/19 1754 02/11/19 1800  BP: 119/79 (!) 142/48  (!) 151/53  Pulse: 64 (!) 57  61  Resp: 13 19  15   Temp:      TempSrc:      SpO2:  100%  97% 97% 97%  Weight:      Height:        General: Alert, Awake and Oriented to Time, Place and Person. Appear in mild distress, affect appropriate Eyes: PERRL, Conjunctiva normal ENT: Oral Mucosa clear moist. Neck: no JVD, no Abnormal Mass Or lumps Cardiovascular: S1 and S2 Present, no Murmur, Peripheral Pulses Present Respiratory: normal respiratory effort, Bilateral Air entry equal and Decreased, no use of accessory muscle, Clear to Auscultation, no Crackles, no wheezes Abdomen: Bowel Sound present, Soft and no tenderness, no hernia Skin: no redness, no Rash, no induration Extremities: no Pedal edema, no calf tenderness Neurologic: Grossly no focal neuro deficit. Bilaterally Equal motor strength Gait not checked due to patient safety concerns.  Labs on Admission:  CBC: Recent Labs  Lab 02/11/19 1354 02/11/19 1401  WBC 9.3  --   NEUTROABS 6.1  --   HGB 11.4* 11.2*  HCT 35.3* 33.0*  MCV 95.4  --   PLT 287  --    Basic Metabolic Panel: Recent Labs  Lab 02/11/19 1354 02/11/19 1401  NA 137 137  K 4.6 4.6  CL 105 105  CO2 21*  --   GLUCOSE 128* 129*  BUN 16 18  CREATININE 1.55* 1.40*  CALCIUM 9.6  --    GFR: Estimated Creatinine Clearance: 32.8 mL/min (A) (by C-G formula based on SCr of 1.4 mg/dL (H)). Liver Function Tests: Recent Labs  Lab 02/11/19 1354  AST 21  ALT 13  ALKPHOS 63  BILITOT 0.7  PROT 7.5  ALBUMIN 3.8   No results for input(s): LIPASE, AMYLASE in the last 168 hours. No results for input(s): AMMONIA in the last 168 hours. Coagulation Profile: Recent Labs  Lab 02/11/19 1354  INR 1.0   Cardiac Enzymes: No results for input(s): CKTOTAL, CKMB, CKMBINDEX, TROPONINI in the last 168 hours. BNP (last 3 results) No results for input(s): PROBNP in the last 8760 hours. HbA1C: No results for input(s): HGBA1C in the last 72 hours. CBG: Recent Labs  Lab 02/11/19 1807  GLUCAP 94   Lipid Profile: No results for input(s): CHOL, HDL, LDLCALC,  TRIG, CHOLHDL, LDLDIRECT in the last 72 hours. Thyroid Function Tests: No results for input(s): TSH, T4TOTAL, FREET4, T3FREE, THYROIDAB in the last 72 hours. Anemia Panel: No results for input(s): VITAMINB12, FOLATE, FERRITIN, TIBC, IRON, RETICCTPCT in the last 72 hours. Urine analysis:    Component Value Date/Time   COLORURINE YELLOW 02/24/2016 1905   APPEARANCEUR CLEAR 02/24/2016 1905   LABSPEC 1.019 02/24/2016 1905   PHURINE 5.5 02/24/2016 1905   GLUCOSEU NEGATIVE 02/24/2016 1905   HGBUR NEGATIVE 02/24/2016 1905   BILIRUBINUR NEGATIVE 02/24/2016 1905   KETONESUR NEGATIVE 02/24/2016 1905   PROTEINUR NEGATIVE 02/24/2016 1905   UROBILINOGEN 0.2 04/02/2013 0232   NITRITE NEGATIVE 02/24/2016 1905   LEUKOCYTESUR NEGATIVE 02/24/2016 1905    Radiological Exams on Admission: Ct Angio Head W Or Wo Contrast  Result Date: 02/11/2019 CLINICAL DATA:  Right facial numbness.  Right arm weakness. EXAM: CT ANGIOGRAPHY HEAD AND NECK TECHNIQUE: Multidetector CT imaging of the head and neck was performed using the standard protocol during bolus administration of intravenous contrast. Multiplanar CT image reconstructions and MIPs were obtained to evaluate the vascular anatomy. Carotid stenosis measurements (when applicable) are obtained utilizing NASCET criteria, using the distal internal carotid diameter as the denominator. CONTRAST:  94mL OMNIPAQUE IOHEXOL 350 MG/ML SOLN COMPARISON:  Head MRA 12/09/2018.  Head and neck CTA 02/17/2016. FINDINGS: CTA NECK FINDINGS Aortic arch:  Normal variant aortic arch branching pattern with common origin of the brachiocephalic and left common carotid arteries. Mild arch atherosclerosis without significant arch vessel origin stenosis. Right carotid system: Patent with moderate soft and calcified plaque in the carotid bulb resulting in less than 50% stenosis. Left carotid system: Patent without stenosis or dissection status post endarterectomy. Vertebral arteries: Patent with  the left vertebral artery being moderately dominant. Moderate to severe right vertebral artery origin stenosis due to calcified plaque, stable to mildly progressed from 2017. Skeleton: Mild cervical disc degeneration. Other neck: No evidence of cervical lymphadenopathy or mass. Upper chest: Mild biapical pleuroparenchymal scarring. Review of the MIP images confirms the above findings CTA HEAD FINDINGS Anterior circulation: The internal carotid arteries are patent from skull base to carotid termini with unchanged mild right cavernous stenosis compared to 2017. An infundibulum at the right posterior communicating origin is unchanged. A 2 mm superiorly directed outpouching at the right MCA bifurcation is unchanged. ACAs and MCAs are patent without evidence of proximal branch occlusion or significant proximal stenosis. Posterior circulation: The intracranial vertebral arteries are widely patent to the basilar. The basilar artery is widely patent a patent right PICA, bilateral AICAs, and bilateral SCAs are identified. The basilar artery is widely patent. There is a small right posterior communicating artery. Both PCAs are patent with branch vessel irregularity but no significant proximal stenosis. No aneurysm is identified. Venous sinuses: Patent. Anatomic variants: None. Review of the MIP images confirms the above findings IMPRESSION: 1. No emergent large vessel occlusion. 2. Mild intracranial atherosclerosis without flow limiting proximal stenosis. 3. 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 4. Predominantly right-sided cervical carotid artery atherosclerosis without significant stenosis. Previous left carotid endarterectomy. 5. 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 6.  Aortic Atherosclerosis (ICD10-I70.0). These results were communicated to Dr. Cheral Marker at 2:58 p.m.on 02/11/2019 by text page via the [facility] messaging system. Electronically Signed   By: Logan Bores M.D.   On: 02/11/2019 15:46   Ct Angio Neck W Or Wo Contrast  Result Date: 02/11/2019 CLINICAL DATA:  Right facial numbness.  Right arm weakness. EXAM: CT ANGIOGRAPHY HEAD AND NECK TECHNIQUE: Multidetector CT imaging of the head and neck was performed using the standard protocol during bolus administration of intravenous contrast. Multiplanar CT image reconstructions and MIPs were obtained to evaluate the vascular anatomy. Carotid stenosis measurements (when applicable) are obtained utilizing NASCET criteria, using the distal internal carotid diameter as the denominator. CONTRAST:  45mL OMNIPAQUE IOHEXOL 350 MG/ML SOLN COMPARISON:  Head MRA 12/09/2018.  Head and neck CTA 02/17/2016. FINDINGS: CTA NECK FINDINGS Aortic arch: Normal variant aortic arch branching pattern with common origin of the brachiocephalic and left common carotid arteries. Mild arch atherosclerosis without significant arch vessel origin stenosis. Right carotid system: Patent with moderate soft and calcified plaque in the carotid bulb resulting in less than 50% stenosis. Left carotid system: Patent without stenosis or dissection status post endarterectomy. Vertebral arteries: Patent with the left vertebral artery being moderately dominant. Moderate to severe right vertebral artery origin stenosis due to calcified plaque, stable to mildly progressed from 2017. Skeleton: Mild cervical disc degeneration. Other neck: No evidence of cervical lymphadenopathy or mass. Upper chest: Mild biapical pleuroparenchymal scarring. Review of the MIP images confirms the above findings CTA HEAD FINDINGS Anterior circulation: The internal carotid arteries are patent from skull base to carotid termini with unchanged mild right cavernous stenosis compared to 2017. An infundibulum at the right  posterior communicating origin is unchanged. A 2 mm superiorly directed outpouching at the right MCA bifurcation is unchanged. ACAs and MCAs are patent without evidence  of proximal branch occlusion or significant proximal stenosis. Posterior circulation: The intracranial vertebral arteries are widely patent to the basilar. The basilar artery is widely patent a patent right PICA, bilateral AICAs, and bilateral SCAs are identified. The basilar artery is widely patent. There is a small right posterior communicating artery. Both PCAs are patent with branch vessel irregularity but no significant proximal stenosis. No aneurysm is identified. Venous sinuses: Patent. Anatomic variants: None. Review of the MIP images confirms the above findings IMPRESSION: 1. No emergent large vessel occlusion. 2. Mild intracranial atherosclerosis without flow limiting proximal stenosis. 3. 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 4. Predominantly right-sided cervical carotid artery atherosclerosis without significant stenosis. Previous left carotid endarterectomy. 5. 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 6.  Aortic Atherosclerosis (ICD10-I70.0). These results were communicated to Dr. Cheral Marker at 2:58 p.m.on 02/11/2019 by text page via the [facility] messaging system. Electronically Signed   By: Logan Bores M.D.   On: 02/11/2019 15:46   Dg Chest Port 1 View  Result Date: 02/11/2019 CLINICAL DATA:  Initial evaluation for acute chest pain. EXAM: PORTABLE CHEST 1 VIEW COMPARISON:  Prior CT from 08/25/2018 FINDINGS: Median sternotomy wires underlying CABG markers and surgical clips noted. Transverse heart size within normal limits. Mediastinal silhouette normal. Lungs normally inflated. Mild streaky right basilar opacity, presumably reflecting chronic post radiation changes as previously seen. No other focal airspace disease. No pulmonary edema or pleural effusion. No pneumothorax. Mild irregular biapical pleural thickening noted. No acute osseous abnormality. IMPRESSION: 1. Mild streaky right basilar opacity, most likely reflecting chronic post  radiation changes as seen on previous CT. 2. No other active cardiopulmonary disease. Electronically Signed   By: Jeannine Boga M.D.   On: 02/11/2019 15:14   Ct Head Code Stroke Wo Contrast  Result Date: 02/11/2019 CLINICAL DATA:  Code stroke.  Right-sided weakness. EXAM: CT HEAD WITHOUT CONTRAST TECHNIQUE: Contiguous axial images were obtained from the base of the skull through the vertex without intravenous contrast. COMPARISON:  Brain MRI 12/09/2018 FINDINGS: Brain: There is no evidence of acute infarct, intracranial hemorrhage, mass, midline shift, or extra-axial fluid collection. Mild cerebral atrophy is within normal limits for age. Vascular: Calcified atherosclerosis at the skull base. No hyperdense vessel. Skull: Previous suboccipital craniectomy. No fracture or suspicious osseous lesion. Sinuses/Orbits: Paranasal sinuses and mastoid air cells are clear. Right cataract extraction. Other: None. ASPECTS [facility] Stroke Program Early CT Score) - Ganglionic level infarction (caudate, lentiform nuclei, internal capsule, insula, M1-M3 cortex): 7 - Supraganglionic infarction (M4-M6 cortex): 3 Total score (0-10 with 10 being normal): 10 IMPRESSION: 1. Unremarkable CT appearance of the brain for age. 2. ASPECTS is 10. These results were called by telephone at the time of interpretation on 02/11/2019 at 2:14 pm to Dr. Kerney Elbe, who verbally acknowledged these results. Electronically Signed   By: Logan Bores M.D.   On: 02/11/2019 14:15   EKG: Independently reviewed. normal sinus rhythm, nonspecific ST and T waves changes.  Assessment/Plan 1. TIA (transient ischemic attack) Presents with right-sided numbness.  Symptoms currently resolved.  CT head unremarkable.  CT head and neck angiogram also negative for emergent large vessel occlusion. We will check carotid Dopplers at the request of neurology. Check MRI brain. PT OT speech therapy consultation. Add aspirin to Plavix. Increase Crestor from 10  mg to 40 mg. Stroke swallow eval. Advance to cardiac diet if the patient passes stroke swallowing evaluation. Continue to monitor on telemetry. Check hemoglobin A1c and LDL tomorrow morning. Follow-up on echocardiogram.  2.  Essential hypertension. Blood pressure stable. Currently holding oral antihypertensive medication.  3.  History of GERD. History of GI bleed. Currently holding iron. Continuing PPI. Monitor for bleeding. Monitor H&H.  4.  Neuropathy. Patient is on chronic gabapentin. We will continue the same for now.  Nutrition: N.p.o. pending stroke swallow eval, cardiac diet after that. DVT Prophylaxis: Subcutaneous Heparin   Advance goals of care discussion: DNR/DNI  Consults: Neurology  Family Communication: no family was present at bedside, at the time of interview.  Discussed with patient's daughter on phone, Opportunity was given to ask question and all questions were answered satisfactorily.  Disposition: Admitted as observation, telemetry unit. Likely to be discharged home, in 1 days.  Author: Berle Mull, MD [facility] 02/11/2019  To reach On-call, see care teams to locate the attending and reach out to them via [email or web address]. If 7PM-7AM, please contact night-coverage If you still have difficulty reaching the attending provider, please page the [facility] (Director on Call) for [facility] on amion for assistance.

## 2019-02-11 NOTE — ED Notes (Signed)
Left arm put in error, Right arm drift is correct

## 2019-02-11 NOTE — ED Notes (Signed)
ED TO INPATIENT HANDOFF REPORT  ED Nurse Name and Phone #: William Hamburger [phone]  S Name/Age/Gender [name] 78 y.o. female Room/Bed: 012C/012C  Code Status   Code Status: DNR  Home/SNF/Other Home Patient oriented to: situation Is this baseline? No   Triage Complete: Triage complete  Chief Complaint R Side Numbness; Slurred Speech  Triage Note Pt reports onset 1 hr ago of right side numbness and tingling and had slurred speech. All symptoms resolved pta except she has right side lip numbness and mild right arm drift. Code stroke activated at triage.   Allergies Allergies  Allergen Reactions  . Azithromycin Swelling    Patient reported past history of lip swelling  . Codeine Other (See Comments)    Dr. Terrence Dupont advised patient not to take this medication  . Doxycycline Swelling    Mouth, lips, feet swelling  . Hydromorphone Palpitations and Other (See Comments)    DILAUDID  -  Pt had a Heart Attack after taking Dilaudid.  . Levaquin [Levofloxacin] Other (See Comments)    Chest pressure, SOB, "pain in between shoulder blades", sweaty -as reported by patient per experience in ED this afternoon  . Avelox [Moxifloxacin Hcl In Nacl] Palpitations    Caused Heart Attack   . Oxycodone-Acetaminophen Other (See Comments)    Says it makes her feel weird  . Risedronate Other (See Comments)    Chest pain    Level of Care/Admitting Diagnosis ED Disposition    ED Disposition Condition [facility]: [place] [[id number]]  Level of Care: Telemetry Medical [104]  I expect the patient will be discharged within 24 hours: No (not a candidate for 5C-Observation unit)  Covid Evaluation: Person Under Investigation (PUI)  Diagnosis: TIA (transient ischemic attack) [384665]  Admitting Physician: Lavina Hamman [[id number]]  Attending Physician: Lavina Hamman [[id number]]  Bed request comments: Neuro  PT Class (Do Not Modify): Observation [104]  PT Acc Code (Do  Not Modify): Observation [10022]       B Medical/Surgery History Past Medical History:  Diagnosis Date  . 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 Asthma   . 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  . Blood in stool    last week of aug 2018  . Carotid artery occlusion   . Complication of anesthesia   . COPD (chronic obstructive pulmonary disease) ([place])   . Coronary artery disease   . 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 Diverticulitis   . Dyspnea    with exertion  . Gastroesophageal reflux disease    occ  . Glaucoma    right eye  . Headache    tension  . Headache syndrome 11/27/2018  . Hiatal hernia   . Hyperlipidemia   . Hypertension   . Iliac artery aneurysm ([place])   . Lung cancer ([place]) dx 2018   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  . Myocardial infarction [facility]) 01/01/2000   Cardiac catheterization  . Peripheral vascular disease ([place])    stents in legs x 2 or 3  . Pneumonia    last time winter 2017 -2018  . PONV (postoperative nausea and vomiting)    occassionally, last colonscopy did ok with anesthesia  . Reflux    Past Surgical History:  Procedure Laterality Date  . ABDOMINAL HYSTERECTOMY    . APPENDECTOMY    . Arnold-chiari malformation repair  1998   Suboccipital craniectomy  . CAROTID ENDARTERECTOMY  03/29/2010   Left  CEA  . CHOLECYSTECTOMY     Gall Bladder  . COLONOSCOPY WITH PROPOFOL N/A 04/22/2015   Procedure: COLONOSCOPY WITH PROPOFOL;  Surgeon: Carol Ada, MD;  Location: [facility];  Service: Endoscopy;  Laterality: N/A;  . COLONOSCOPY WITH PROPOFOL N/A 05/25/2016   Procedure: COLONOSCOPY WITH PROPOFOL;  Surgeon: Carol Ada, MD;  [facility];  Service: Endoscopy;  Laterality: N/A;  . COLONOSCOPY WITH PROPOFOL N/A 05/03/2017   Procedure: COLONOSCOPY WITH PROPOFOL;  Surgeon: Carol Ada, MD;  [facility];  Service:  Endoscopy;  Laterality: N/A;  . CORNEAL TRANSPLANT     Right  . CORONARY ARTERY BYPASS GRAFT  01/01/2000   x 3  . ENTEROSCOPY N/A 02/27/2018   Procedure: ENTEROSCOPY;  Surgeon: Carol Ada, MD;  [facility];  Service: Endoscopy;  Laterality: N/A;  . ENTEROSCOPY N/A 07/18/2018   Procedure: ENTEROSCOPY;  Surgeon: Carol Ada, MD;  [facility];  Service: Endoscopy;  Laterality: N/A;  . ESOPHAGOGASTRODUODENOSCOPY N/A 05/25/2016   Procedure: ESOPHAGOGASTRODUODENOSCOPY (EGD);  Surgeon: Carol Ada, MD;  Location: [facility];  Service: Endoscopy;  Laterality: N/A;  . ESOPHAGOGASTRODUODENOSCOPY N/A 08/01/2018   Procedure: ESOPHAGOGASTRODUODENOSCOPY (EGD);  Surgeon: Carol Ada, MD;  Location: [facility];  Service: Endoscopy;  Laterality: N/A;  . EYE SURGERY Right 1995 or 1996   Laser surgery for retinal hemorrhage  . GIVENS CAPSULE STUDY N/A 07/16/2018   Procedure: GIVENS CAPSULE STUDY;  Surgeon: Carol Ada, MD;  [facility];  Service: Endoscopy;  Laterality: N/A;  . HOT HEMOSTASIS N/A 02/27/2018   Procedure: HOT HEMOSTASIS (ARGON PLASMA COAGULATION/BICAP);  Surgeon: Carol Ada, MD;  Location: [facility];  Service: Endoscopy;  Laterality: N/A;  . HOT HEMOSTASIS N/A 08/01/2018   Procedure: HOT HEMOSTASIS (ARGON PLASMA COAGULATION/BICAP);  Surgeon: Carol Ada, MD;  Location: [facility];  Service: Endoscopy;  Laterality: N/A;  . IR RADIOLOGIST EVAL & MGMT  12/14/2016  . LEFT HEART CATH AND CORS/GRAFTS ANGIOGRAPHY N/A 01/09/2018   Procedure: LEFT HEART CATH AND CORS/GRAFTS ANGIOGRAPHY;  Surgeon: Charolette Forward, MD;  Location: [facility];  Service: Cardiovascular;  Laterality: N/A;  . LEFT HEART CATHETERIZATION WITH CORONARY ANGIOGRAM N/A 08/03/2014   Procedure: LEFT HEART CATHETERIZATION WITH CORONARY ANGIOGRAM;  Surgeon: Birdie Riddle, MD;  Location: [facility];  Service: Cardiovascular;  Laterality: N/A;  . Post Coronary Artery  BPG   01/05/2000   Right jugular sheath removed  . PR VEIN BYPASS GRAFT,AORTO-FEM-POP    . ROTATOR CUFF REPAIR     Right     A IV Location/Drains/Wounds Patient Lines/Drains/Airways Status   Active Line/Drains/Airways    Name:   Placement date:   Placement time:   Site:   Days:   Peripheral IV 02/11/19 Left Antecubital   02/11/19    1430    Antecubital   less than 1          Intake/Output Last 24 hours  Intake/Output Summary (Last 24 hours) at 02/11/2019 2126 Last data filed at 02/11/2019 1729 Gross per 24 hour  Intake 1531.8 ml  Output -  Net 1531.8 ml    Labs/Imaging Results for orders placed or performed during the hospital encounter of 02/11/19 (from the past 48 hour(s))  Protime-INR     Status: None   Collection Time: 02/11/19  1:54 PM  Result Value Ref Range   Prothrombin Time 13.5 11.4 - 15.2 seconds   INR 1.0 0.8 -  1.2    Comment: (NOTE) INR goal varies based on device and disease states. Performed at [facility], [place] [address] [place], [place] [address]   APTT     Status: None   Collection Time: 02/11/19  1:54 PM  Result Value Ref Range   aPTT 33 24 - 36 seconds    Comment: Performed at [facility] [address], [place], [place] [address]  CBC     Status: Abnormal   Collection Time: 02/11/19  1:54 PM  Result Value Ref Range   WBC 9.3 4.0 - 10.5 K/uL   RBC 3.70 (L) 3.87 - 5.11 MIL/uL   Hemoglobin 11.4 (L) 12.0 - 15.0 g/dL   HCT 35.3 (L) 36.0 - 46.0 %   MCV 95.4 80.0 - 100.0 fL   MCH 30.8 26.0 - 34.0 pg   MCHC 32.3 30.0 - 36.0 g/dL   RDW 12.2 11.5 - 15.5 %   Platelets 287 150 - 400 K/uL   nRBC 0.0 0.0 - 0.2 %    Comment: Performed at [facility], [place] [address], [place], [place] [address]  Differential     Status: None   Collection Time: 02/11/19  1:54 PM  Result Value Ref Range   Neutrophils Relative % 66 %   Neutro Abs 6.1 1.7 - 7.7 K/uL   Lymphocytes Relative 24 %   Lymphs Abs 2.2 0.7 - 4.0 K/uL   Monocytes Relative 7 %    Monocytes Absolute 0.7 0.1 - 1.0 K/uL   Eosinophils Relative 3 %   Eosinophils Absolute 0.3 0.0 - 0.5 K/uL   Basophils Relative 0 %   Basophils Absolute 0.0 0.0 - 0.1 K/uL   Immature Granulocytes 0 %   Abs Immature Granulocytes 0.03 0.00 - 0.07 K/uL    Comment: Performed at [facility], [place] [address], [place],  [address]  Comprehensive metabolic panel     Status: Abnormal   Collection Time: 02/11/19  1:54 PM  Result Value Ref Range   Sodium 137 135 - 145 mmol/L   Potassium 4.6 3.5 - 5.1 mmol/L   Chloride 105 98 - 111 mmol/L   CO2 21 (L) 22 - 32 mmol/L   Glucose, Bld 128 (H) 70 - 99 mg/dL   BUN 16 8 - 23 mg/dL   Creatinine, Ser 1.55 (H) 0.44 - 1.00 mg/dL   Calcium 9.6 8.9 - 10.3 mg/dL   Total Protein 7.5 6.5 - 8.1 g/dL   Albumin 3.8 3.5 - 5.0 g/dL   AST 21 15 - 41 U/L   ALT 13 0 - 44 U/L   Alkaline Phosphatase 63 38 - 126 U/L   Total Bilirubin 0.7 0.3 - 1.2 mg/dL   GFR calc non Af Amer 32 (L) >60 mL/min   GFR calc Af Amer 37 (L) >60 mL/min   Anion gap 11 5 - 15    Comment: Performed at [facility] [address] [place], [place] [address]  Troponin I (High Sensitivity)     Status: None   Collection Time: 02/11/19  1:54 PM  Result Value Ref Range   Troponin I (High Sensitivity) 3 <18 ng/L    Comment: (NOTE) Elevated high sensitivity troponin I (hsTnI) values and significant  changes across serial measurements may suggest ACS but many other  chronic and acute conditions are known to elevate hsTnI results.  Refer to the "Links" section for chest pain algorithms and additional  guidance. Performed  at [facility], [place] [address], [place], [place] [address]   I-stat chem 8, ED     Status: Abnormal   Collection Time: 02/11/19  2:01 PM  Result Value Ref Range   Sodium 137 135 - 145 mmol/L   Potassium 4.6 3.5 - 5.1 mmol/L   Chloride 105 98 - 111 mmol/L   BUN 18 8 - 23 mg/dL   Creatinine, Ser 1.40 (H) 0.44 - 1.00 mg/dL   Glucose, Bld 129 (H) 70 -  99 mg/dL   Calcium, Ion 1.15 1.15 - 1.40 mmol/L   TCO2 24 22 - 32 mmol/L   Hemoglobin 11.2 (L) 12.0 - 15.0 g/dL   HCT 33.0 (L) 36.0 - 46.0 %  Troponin I (High Sensitivity)     Status: None   Collection Time: 02/11/19  5:53 PM  Result Value Ref Range   Troponin I (High Sensitivity) 5 <18 ng/L    Comment: (NOTE) Elevated high sensitivity troponin I (hsTnI) values and significant  changes across serial measurements may suggest ACS but many other  chronic and acute conditions are known to elevate hsTnI results.  Refer to the "Links" section for chest pain algorithms and additional  guidance. Performed at [facility], [place] [address] [place], [place] [phone]   SARS Coronavirus 2 (CEPHEID - Performed in [facility] hospital lab), Hosp Order     Status: None   Collection Time: 02/11/19  5:53 PM   Specimen: Nasopharyngeal Swab  Result Value Ref Range   SARS Coronavirus 2 NEGATIVE NEGATIVE    Comment: (NOTE) If result is NEGATIVE SARS-CoV-2 target nucleic acids are NOT DETECTED. The SARS-CoV-2 RNA is generally detectable in upper and lower  respiratory specimens during the acute phase of infection. The lowest  concentration of SARS-CoV-2 viral copies this assay can detect is 250  copies / mL. A negative result does not preclude SARS-CoV-2 infection  and should not be used as the sole basis for treatment or other  patient management decisions.  A negative result may occur with  improper specimen collection / handling, submission of specimen other  than nasopharyngeal swab, presence of viral mutation(s) within the  areas targeted by this assay, and inadequate number of viral copies  (<250 copies / mL). A negative result must be combined with clinical  observations, patient history, and epidemiological information. If result is POSITIVE SARS-CoV-2 target nucleic acids are DETECTED. The SARS-CoV-2 RNA is generally detectable in upper and lower  respiratory specimens dur ing the  acute phase of infection.  Positive  results are indicative of active infection with SARS-CoV-2.  Clinical  correlation with patient history and other diagnostic information is  necessary to determine patient infection status.  Positive results do  not rule out bacterial infection or co-infection with other viruses. If result is PRESUMPTIVE POSTIVE SARS-CoV-2 nucleic acids MAY BE PRESENT.   A presumptive positive result was obtained on the submitted specimen  and confirmed on repeat testing.  While 2019 novel coronavirus  (SARS-CoV-2) nucleic acids may be present in the submitted sample  additional confirmatory testing may be necessary for epidemiological  and / or clinical management purposes  to differentiate between  SARS-CoV-2 and other Sarbecovirus currently known to infect humans.  If clinically indicated additional testing with an alternate test  methodology [phone]) is advised. The SARS-CoV-2 RNA is generally  detectable in upper and lower respiratory sp ecimens during the acute  phase of infection. The expected result is Negative. Fact Sheet for  Patients:  StrictlyIdeas.no Fact Sheet for Healthcare Providers: BankingDealers.co.za This test is not yet approved or cleared by the [place] FDA and has been authorized for detection and/or diagnosis of SARS-CoV-2 by FDA under an Emergency Use Authorization (EUA).  This EUA will remain in effect (meaning this test can be used) for the duration of the COVID-19 declaration under Section 564(b)(1) of the Act, 21 U.S.C. section 360bbb-3(b)(1), unless the authorization is terminated or revoked sooner. Performed at [facility], [place] [address] [place], [place] [address]   CBG monitoring, ED     Status: None   Collection Time: 02/11/19  6:07 PM  Result Value Ref Range   Glucose-Capillary 94 70 - 99 mg/dL   Ct Angio Head W Or Wo Contrast  Result Date: 02/11/2019 CLINICAL DATA:   Right facial numbness.  Right arm weakness. EXAM: CT ANGIOGRAPHY HEAD AND NECK TECHNIQUE: Multidetector CT imaging of the head and neck was performed using the standard protocol during bolus administration of intravenous contrast. Multiplanar CT image reconstructions and MIPs were obtained to evaluate the vascular anatomy. Carotid stenosis measurements (when applicable) are obtained utilizing NASCET criteria, using the distal internal carotid diameter as the denominator. CONTRAST:  31mL OMNIPAQUE IOHEXOL 350 MG/ML SOLN COMPARISON:  Head MRA 12/09/2018.  Head and neck CTA 02/17/2016. FINDINGS: CTA NECK FINDINGS Aortic arch: Normal variant aortic arch branching pattern with common origin of the brachiocephalic and left common carotid arteries. Mild arch atherosclerosis without significant arch vessel origin stenosis. Right carotid system: Patent with moderate soft and calcified plaque in the carotid bulb resulting in less than 50% stenosis. Left carotid system: Patent without stenosis or dissection status post endarterectomy. Vertebral arteries: Patent with the left vertebral artery being moderately dominant. Moderate to severe right vertebral artery origin stenosis due to calcified plaque, stable to mildly progressed from 2017. Skeleton: Mild cervical disc degeneration. Other neck: No evidence of cervical lymphadenopathy or mass. Upper chest: Mild biapical pleuroparenchymal scarring. Review of the MIP images confirms the above findings CTA HEAD FINDINGS Anterior circulation: The internal carotid arteries are patent from skull base to carotid termini with unchanged mild right cavernous stenosis compared to 2017. An infundibulum at the right posterior communicating origin is unchanged. A 2 mm superiorly directed outpouching at the right MCA bifurcation is unchanged. ACAs and MCAs are patent without evidence of proximal branch occlusion or significant proximal stenosis. Posterior circulation: The intracranial vertebral  arteries are widely patent to the basilar. The basilar artery is widely patent a patent right PICA, bilateral AICAs, and bilateral SCAs are identified. The basilar artery is widely patent. There is a small right posterior communicating artery. Both PCAs are patent with branch vessel irregularity but no significant proximal stenosis. No aneurysm is identified. Venous sinuses: Patent. Anatomic variants: None. Review of the MIP images confirms the above findings IMPRESSION: 1. No emergent large vessel occlusion. 2. Mild intracranial atherosclerosis without flow limiting proximal stenosis. 3. 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 4. Predominantly right-sided cervical carotid artery atherosclerosis without significant stenosis. Previous left carotid endarterectomy. 5. 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 6.  Aortic Atherosclerosis (ICD10-I70.0). These results were communicated to Dr. Cheral Marker at 2:58 p.m.on 02/11/2019 by text page via the [facility] messaging system. Electronically Signed   By: Logan Bores M.D.   On: 02/11/2019 15:46   Ct Angio Neck W Or Wo Contrast  Result Date: 02/11/2019 CLINICAL DATA:  Right facial numbness.  Right arm weakness.  EXAM: CT ANGIOGRAPHY HEAD AND NECK TECHNIQUE: Multidetector CT imaging of the head and neck was performed using the standard protocol during bolus administration of intravenous contrast. Multiplanar CT image reconstructions and MIPs were obtained to evaluate the vascular anatomy. Carotid stenosis measurements (when applicable) are obtained utilizing NASCET criteria, using the distal internal carotid diameter as the denominator. CONTRAST:  35mL OMNIPAQUE IOHEXOL 350 MG/ML SOLN COMPARISON:  Head MRA 12/09/2018.  Head and neck CTA 02/17/2016. FINDINGS: CTA NECK FINDINGS Aortic arch: Normal variant aortic arch branching pattern with common origin of the brachiocephalic and left common carotid arteries. Mild arch  atherosclerosis without significant arch vessel origin stenosis. Right carotid system: Patent with moderate soft and calcified plaque in the carotid bulb resulting in less than 50% stenosis. Left carotid system: Patent without stenosis or dissection status post endarterectomy. Vertebral arteries: Patent with the left vertebral artery being moderately dominant. Moderate to severe right vertebral artery origin stenosis due to calcified plaque, stable to mildly progressed from 2017. Skeleton: Mild cervical disc degeneration. Other neck: No evidence of cervical lymphadenopathy or mass. Upper chest: Mild biapical pleuroparenchymal scarring. Review of the MIP images confirms the above findings CTA HEAD FINDINGS Anterior circulation: The internal carotid arteries are patent from skull base to carotid termini with unchanged mild right cavernous stenosis compared to 2017. An infundibulum at the right posterior communicating origin is unchanged. A 2 mm superiorly directed outpouching at the right MCA bifurcation is unchanged. ACAs and MCAs are patent without evidence of proximal branch occlusion or significant proximal stenosis. Posterior circulation: The intracranial vertebral arteries are widely patent to the basilar. The basilar artery is widely patent a patent right PICA, bilateral AICAs, and bilateral SCAs are identified. The basilar artery is widely patent. There is a small right posterior communicating artery. Both PCAs are patent with branch vessel irregularity but no significant proximal stenosis. No aneurysm is identified. Venous sinuses: Patent. Anatomic variants: None. Review of the MIP images confirms the above findings IMPRESSION: 1. No emergent large vessel occlusion. 2. Mild intracranial atherosclerosis without flow limiting proximal stenosis. 3. 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 4. Predominantly right-sided cervical carotid artery atherosclerosis without significant stenosis. Previous left  carotid endarterectomy. 5. 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 6.  Aortic Atherosclerosis (ICD10-I70.0). These results were communicated to Dr. Cheral Marker at 2:58 p.m.on 02/11/2019 by text page via the [facility] messaging system. Electronically Signed   By: Logan Bores M.D.   On: 02/11/2019 15:46   Dg Chest Port 1 View  Result Date: 02/11/2019 CLINICAL DATA:  Initial evaluation for acute chest pain. EXAM: PORTABLE CHEST 1 VIEW COMPARISON:  Prior CT from 08/25/2018 FINDINGS: Median sternotomy wires underlying CABG markers and surgical clips noted. Transverse heart size within normal limits. Mediastinal silhouette normal. Lungs normally inflated. Mild streaky right basilar opacity, presumably reflecting chronic post radiation changes as previously seen. No other focal airspace disease. No pulmonary edema or pleural effusion. No pneumothorax. Mild irregular biapical pleural thickening noted. No acute osseous abnormality. IMPRESSION: 1. Mild streaky right basilar opacity, most likely reflecting chronic post radiation changes as seen on previous CT. 2. No other active cardiopulmonary disease. Electronically Signed   By: Jeannine Boga M.D.   On: 02/11/2019 15:14   Ct Head Code Stroke Wo Contrast  Result Date: 02/11/2019 CLINICAL DATA:  Code stroke.  Right-sided weakness. EXAM: CT HEAD WITHOUT CONTRAST TECHNIQUE: Contiguous axial images were obtained from the base of the skull through the  vertex without intravenous contrast. COMPARISON:  Brain MRI 12/09/2018 FINDINGS: Brain: There is no evidence of acute infarct, intracranial hemorrhage, mass, midline shift, or extra-axial fluid collection. Mild cerebral atrophy is within normal limits for age. Vascular: Calcified atherosclerosis at the skull base. No hyperdense vessel. Skull: Previous suboccipital craniectomy. No fracture or suspicious osseous lesion. Sinuses/Orbits: Paranasal sinuses and mastoid air cells  are clear. Right cataract extraction. Other: None. ASPECTS [facility] Stroke Program Early CT Score) - Ganglionic level infarction (caudate, lentiform nuclei, internal capsule, insula, M1-M3 cortex): 7 - Supraganglionic infarction (M4-M6 cortex): 3 Total score (0-10 with 10 being normal): 10 IMPRESSION: 1. Unremarkable CT appearance of the brain for age. 2. ASPECTS is 10. These results were called by telephone at the time of interpretation on 02/11/2019 at 2:14 pm to Dr. Kerney Elbe, who verbally acknowledged these results. Electronically Signed   By: Logan Bores M.D.   On: 02/11/2019 14:15    Pending Labs Unresulted Labs (From admission, onward)    Start     Ordered   02/12/19 0500  Hemoglobin A1c  Tomorrow morning,   R     02/11/19 1755   02/12/19 0500  Lipid panel  Tomorrow morning,   R    Comments: Fasting    02/11/19 1755          Vitals/Pain Today's Vitals   02/11/19 1700 02/11/19 1754 02/11/19 1800 02/11/19 1856  BP: (!) 142/48  (!) 151/53   Pulse: (!) 57  61   Resp: 19  15   Temp:      TempSrc:      SpO2: 97% 97% 97%   Weight:      Height:      PainSc:    6     Isolation Precautions No active isolations  Medications Medications  sodium chloride flush (NS) 0.9 % injection 3 mL (3 mLs Intravenous Not Given 02/11/19 1729)  clopidogrel (PLAVIX) tablet 75 mg (has no administration in time range)  pantoprazole (PROTONIX) EC tablet 40 mg (has no administration in time range)  gabapentin (NEURONTIN) capsule 300 mg (has no administration in time range)  rosuvastatin (CRESTOR) tablet 40 mg (has no administration in time range)  umeclidinium-vilanterol (ANORO ELLIPTA) 62.5-25 MCG/INH 2 puff (has no administration in time range)  acetaminophen (TYLENOL) tablet 650 mg (650 mg Oral Given 02/11/19 1857)    Or  acetaminophen (TYLENOL) solution 650 mg ( Per Tube See Alternative 02/11/19 1857)    Or  acetaminophen (TYLENOL) suppository 650 mg ( Rectal See Alternative 02/11/19 1857)   enoxaparin (LOVENOX) injection 40 mg (has no administration in time range)  aspirin suppository 300 mg (has no administration in time range)    Or  aspirin tablet 325 mg (has no administration in time range)  iohexol (OMNIPAQUE) 350 MG/ML injection 75 mL (75 mLs Intravenous Contrast Given 02/11/19 1443)  sodium chloride 0.9 % bolus 1,000 mL (0 mLs Intravenous Stopped 02/11/19 1729)   stroke: mapping our early stages of recovery book (1 each Does not apply Given 02/11/19 1856)    Mobility walks with person assist     Focused Assessments Neuro Assessment Handoff:  Swallow screen pass? Yes    NIH Stroke Scale ( + Modified Stroke Scale Criteria)  Interval: Initial Level of Consciousness (1a.)   : Alert, keenly responsive LOC Questions (1b. )   +: Answers both questions correctly LOC Commands (1c. )   + : Performs both tasks correctly Best Gaze (2. )  +: Normal Visual (3. )  +:  No visual loss Facial Palsy (4. )    : Normal symmetrical movements Motor Arm, Left (5a. )   +: No drift Motor Arm, Right (5b. )   +: No drift Motor Leg, Left (6a. )   +: No drift Motor Leg, Right (6b. )   +: No drift Limb Ataxia (7. ): Absent Sensory (8. )   +: Mild-to-moderate sensory loss, patient feels pinprick is less sharp or is dull on the affected side, or there is a loss of superficial pain with pinprick, but patient is aware of being touched Best Language (9. )   +: No aphasia Dysarthria (10. ): Normal Extinction/Inattention (11.)   +: No Abnormality Modified SS Total  +: 1 Complete NIHSS TOTAL: 2 Last date known well: 02/11/19 Last time known well: 1300 Neuro Assessment: Within Defined Limits Neuro Checks:   Initial (02/11/19 1412)  Last Documented NIHSS Modified Score: 1 (02/11/19 1930) Has TPA been given? No If patient is a Neuro Trauma and patient is going to OR before floor call report to [place] nurse: [phone] or [phone]     R Recommendations: See Admitting Provider  Note  Report given to:   Additional Notes: @ MRI 2120

## 2019-02-11 NOTE — ED Triage Notes (Addendum)
Pt reports onset 1 hr ago of right side numbness and tingling and had slurred speech. All symptoms resolved pta except she has right side lip numbness and mild right arm drift. Code stroke activated at triage.

## 2019-02-11 NOTE — ED Notes (Signed)
Neurology at bedside.

## 2019-02-11 NOTE — Code Documentation (Signed)
1440 - Pt reports left sided Chest discomfort 5/10. Reports it feels like indigestion and pt is belching. EDP made aware and EKG completed at this time.

## 2019-02-11 NOTE — ED Notes (Signed)
Patient transported to MRI 

## 2019-02-11 NOTE — Code Documentation (Signed)
Patient reports chest discomfort 0/10 at this time. Denies any other symptoms.

## 2019-02-11 NOTE — ED Notes (Signed)
Pt given sprite and crackers

## 2019-02-12 ENCOUNTER — Ambulatory Visit: Payer: Medicare Other | Admitting: Pulmonary Disease

## 2019-02-12 ENCOUNTER — Encounter (HOSPITAL_COMMUNITY): Payer: Medicare Other

## 2019-02-12 ENCOUNTER — Observation Stay (HOSPITAL_BASED_OUTPATIENT_CLINIC_OR_DEPARTMENT_OTHER): Payer: Medicare Other

## 2019-02-12 ENCOUNTER — Encounter (HOSPITAL_COMMUNITY): Payer: Self-pay | Admitting: *Deleted

## 2019-02-12 ENCOUNTER — Other Ambulatory Visit: Payer: Self-pay

## 2019-02-12 DIAGNOSIS — G459 Transient cerebral ischemic attack, unspecified: Secondary | ICD-10-CM

## 2019-02-12 DIAGNOSIS — I639 Cerebral infarction, unspecified: Secondary | ICD-10-CM | POA: Diagnosis not present

## 2019-02-12 DIAGNOSIS — I739 Peripheral vascular disease, unspecified: Secondary | ICD-10-CM | POA: Diagnosis not present

## 2019-02-12 LAB — CBC
HCT: 29.3 % — ABNORMAL LOW (ref 36.0–46.0)
Hemoglobin: 9.4 g/dL — ABNORMAL LOW (ref 12.0–15.0)
MCH: 30.4 pg (ref 26.0–34.0)
MCHC: 32.1 g/dL (ref 30.0–36.0)
MCV: 94.8 fL (ref 80.0–100.0)
Platelets: 233 10*3/uL (ref 150–400)
RBC: 3.09 MIL/uL — ABNORMAL LOW (ref 3.87–5.11)
RDW: 12.3 % (ref 11.5–15.5)
WBC: 6.7 10*3/uL (ref 4.0–10.5)
nRBC: 0 % (ref 0.0–0.2)

## 2019-02-12 LAB — BASIC METABOLIC PANEL
Anion gap: 7 (ref 5–15)
BUN: 18 mg/dL (ref 8–23)
CO2: 25 mmol/L (ref 22–32)
Calcium: 9.2 mg/dL (ref 8.9–10.3)
Chloride: 107 mmol/L (ref 98–111)
Creatinine, Ser: 1.38 mg/dL — ABNORMAL HIGH (ref 0.44–1.00)
GFR calc Af Amer: 43 mL/min — ABNORMAL LOW (ref >60)
GFR calc non Af Amer: 37 mL/min — ABNORMAL LOW (ref >60)
Glucose, Bld: 97 mg/dL (ref 70–99)
Potassium: 4.6 mmol/L (ref 3.5–5.1)
Sodium: 139 mmol/L (ref 135–145)

## 2019-02-12 LAB — LIPID PANEL
Cholesterol: 137 mg/dL (ref 0–200)
HDL: 47 mg/dL (ref >40)
LDL Cholesterol: 74 mg/dL (ref 0–99)
Total CHOL/HDL Ratio: 2.9 RATIO
Triglycerides: 78 mg/dL (ref <150)
VLDL: 16 mg/dL (ref 0–40)

## 2019-02-12 LAB — ECHOCARDIOGRAM COMPLETE
Height: 62 in
Weight: 2698.43 oz

## 2019-02-12 LAB — HEMOGLOBIN A1C
Hgb A1c MFr Bld: 5.5 % (ref 4.8–5.6)
Mean Plasma Glucose: 111.15 mg/dL

## 2019-02-12 MED ORDER — ROSUVASTATIN CALCIUM 5 MG PO TABS: 10.0000 mg | ORAL_TABLET | Freq: Every day | ORAL | Status: DC

## 2019-02-12 NOTE — Progress Notes (Signed)
SLP Cancellation Note  Patient Details Name: [name] MRN: [id number] DOB: Jan 02, 1941   Cancelled treatment:       Reason Eval/Treat Not Completed: Patient at procedure or test/unavailable(Pt having echo at this time. SLP will follow up. )  [name] I. Hardin Negus, [place], [place] Office number [phone] Pager [place] 02/12/2019, 9:50 AM

## 2019-02-12 NOTE — Progress Notes (Signed)
Patient being discharged home. Education and information given to patient. IV removed. CCMD notified. Patient leaving unit via wheelchair.

## 2019-02-12 NOTE — TOC Transition Note (Signed)
Transition of Care [facility]) - CM/SW Discharge Note   Patient Details  Name: AHNIKA HANNIBAL MRN: [id number] Date of Birth: September 14, 1940  Transition of Care [facility]) CM/SW Contact:  Marilu Favre, RN Phone Number: 02/12/2019, 2:28 PM   Clinical Narrative:     Confirmed face sheet information. Patient lives with son, grandson and grand daughter.  [facility]   Final next level of care: [place] Barriers to Discharge: No Barriers Identified   Patient Goals and CMS Choice Patient states their goals for this hospitalization and ongoing recovery are:: to go home CMS Medicare.gov Compare Post Acute Care list provided to:: Patient Choice offered to / list presented to : Patient  Discharge Placement                       Discharge Plan and Services   Discharge Planning Services: CM Consult Post Acute Care Choice: Home Health          DME Arranged: N/A         HH Arranged: PT [place] Agency: [facility] Date [place]: 02/12/19 Time [place]: 7943 Representative spoke with at [place]: [place] ([place]) Interventions     Readmission Risk Interventions No flowsheet data found.

## 2019-02-12 NOTE — Progress Notes (Signed)
STROKE TEAM PROGRESS NOTE   INTERVAL HISTORY Pt sitting in chair, her daughter is on the speaker phone. She recounted HPI with me. She stated that she had yesterday she had acute onset right hand numbness and tingling, when she talked to her daughter over the phone she felt right face numbness tingling with mild slurred speech, as well as blurry and black with left eye vision.  She denies any headache yesterday.  Symptoms improved and resolved in the evening and night.  This morning she woke up with headache at right back of her head which is her usual headache location, along with again tingling of right hand and she also dropped her spoon during breakfast.  Currently, all symptoms resolved.  She was worried about stroke as per mom and brother had strokes as her mom died of stroke.  Vitals:   03-12-2019 0415 03/12/19 0818 03-12-19 0857 Mar 12, 2019 1152  BP: (!) 131/44 (!) 125/51  (!) 125/56  Pulse: (!) 49 68 (!) 59 63  Resp: 16 17 18    Temp: 97.6 F (36.4 C) 98 F (36.7 C)  98.2 F (36.8 C)  TempSrc: Oral Oral  Oral  SpO2: 100% 98% 94% 96%  Weight:      Height:        CBC:  Recent Labs  Lab 02/11/19 1354 02/11/19 1401 03/12/2019 0403  WBC 9.3  --  6.7  NEUTROABS 6.1  --   --   HGB 11.4* 11.2* 9.4*  HCT 35.3* 33.0* 29.3*  MCV 95.4  --  94.8  PLT 287  --  202    Basic Metabolic Panel:  Recent Labs  Lab 02/11/19 1354 02/11/19 1401 12-Mar-2019 0403  NA 137 137 139  K 4.6 4.6 4.6  CL 105 105 107  CO2 21*  --  25  GLUCOSE 128* 129* 97  BUN 16 18 18   CREATININE 1.55* 1.40* 1.38*  CALCIUM 9.6  --  9.2   Lipid Panel:     Component Value Date/Time   CHOL 137 03-12-19 0403   TRIG 78 03/12/2019 0403   HDL 47 03-12-19 0403   CHOLHDL 2.9 12-Mar-2019 0403   VLDL 16 Mar 12, 2019 0403   LDLCALC 74 03/12/2019 0403   HgbA1c:  Lab Results  Component Value Date   HGBA1C 5.5 03-12-19   Urine Drug Screen: No results found for: LABOPIA, COCAINSCRNUR, LABBENZ, AMPHETMU, THCU, LABBARB   Alcohol Level No results found for: ETH  IMAGING Ct Angio Head W Or Wo Contrast  Result Date: 02/11/2019 CLINICAL DATA:  Right facial numbness.  Right arm weakness. EXAM: CT ANGIOGRAPHY HEAD AND NECK TECHNIQUE: Multidetector CT imaging of the head and neck was performed using the standard protocol during bolus administration of intravenous contrast. Multiplanar CT image reconstructions and MIPs were obtained to evaluate the vascular anatomy. Carotid stenosis measurements (when applicable) are obtained utilizing NASCET criteria, using the distal internal carotid diameter as the denominator. CONTRAST:  36mL OMNIPAQUE IOHEXOL 350 MG/ML SOLN COMPARISON:  Head MRA 12/09/2018.  Head and neck CTA 02/17/2016. FINDINGS: CTA NECK FINDINGS Aortic arch: Normal variant aortic arch branching pattern with common origin of the brachiocephalic and left common carotid arteries. Mild arch atherosclerosis without significant arch vessel origin stenosis. Right carotid system: Patent with moderate soft and calcified plaque in the carotid bulb resulting in less than 50% stenosis. Left carotid system: Patent without stenosis or dissection status post endarterectomy. Vertebral arteries: Patent with the left vertebral artery being moderately dominant. Moderate to severe right vertebral artery  origin stenosis due to calcified plaque, stable to mildly progressed from 2017. Skeleton: Mild cervical disc degeneration. Other neck: No evidence of cervical lymphadenopathy or mass. Upper chest: Mild biapical pleuroparenchymal scarring. Review of the MIP images confirms the above findings CTA HEAD FINDINGS Anterior circulation: The internal carotid arteries are patent from skull base to carotid termini with unchanged mild right cavernous stenosis compared to 2017. An infundibulum at the right posterior communicating origin is unchanged. A 2 mm superiorly directed outpouching at the right MCA bifurcation is unchanged. ACAs and MCAs are patent  without evidence of proximal branch occlusion or significant proximal stenosis. Posterior circulation: The intracranial vertebral arteries are widely patent to the basilar. The basilar artery is widely patent a patent right PICA, bilateral AICAs, and bilateral SCAs are identified. The basilar artery is widely patent. There is a small right posterior communicating artery. Both PCAs are patent with branch vessel irregularity but no significant proximal stenosis. No aneurysm is identified. Venous sinuses: Patent. Anatomic variants: None. Review of the MIP images confirms the above findings IMPRESSION: 1. No emergent large vessel occlusion. 2. Mild intracranial atherosclerosis without flow limiting proximal stenosis. 3. 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 4. Predominantly right-sided cervical carotid artery atherosclerosis without significant stenosis. Previous left carotid endarterectomy. 5. 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 6.  Aortic Atherosclerosis (ICD10-I70.0). These results were communicated to Dr. Cheral Marker at 2:58 p.m.on 02/11/2019 by text page via the [facility] messaging system. Electronically Signed   By: Logan Bores M.D.   On: 02/11/2019 15:46   Ct Angio Neck W Or Wo Contrast  Result Date: 02/11/2019 CLINICAL DATA:  Right facial numbness.  Right arm weakness. EXAM: CT ANGIOGRAPHY HEAD AND NECK TECHNIQUE: Multidetector CT imaging of the head and neck was performed using the standard protocol during bolus administration of intravenous contrast. Multiplanar CT image reconstructions and MIPs were obtained to evaluate the vascular anatomy. Carotid stenosis measurements (when applicable) are obtained utilizing NASCET criteria, using the distal internal carotid diameter as the denominator. CONTRAST:  25mL OMNIPAQUE IOHEXOL 350 MG/ML SOLN COMPARISON:  Head MRA 12/09/2018.  Head and neck CTA 02/17/2016. FINDINGS: CTA NECK FINDINGS Aortic arch:  Normal variant aortic arch branching pattern with common origin of the brachiocephalic and left common carotid arteries. Mild arch atherosclerosis without significant arch vessel origin stenosis. Right carotid system: Patent with moderate soft and calcified plaque in the carotid bulb resulting in less than 50% stenosis. Left carotid system: Patent without stenosis or dissection status post endarterectomy. Vertebral arteries: Patent with the left vertebral artery being moderately dominant. Moderate to severe right vertebral artery origin stenosis due to calcified plaque, stable to mildly progressed from 2017. Skeleton: Mild cervical disc degeneration. Other neck: No evidence of cervical lymphadenopathy or mass. Upper chest: Mild biapical pleuroparenchymal scarring. Review of the MIP images confirms the above findings CTA HEAD FINDINGS Anterior circulation: The internal carotid arteries are patent from skull base to carotid termini with unchanged mild right cavernous stenosis compared to 2017. An infundibulum at the right posterior communicating origin is unchanged. A 2 mm superiorly directed outpouching at the right MCA bifurcation is unchanged. ACAs and MCAs are patent without evidence of proximal branch occlusion or significant proximal stenosis. Posterior circulation: The intracranial vertebral arteries are widely patent to the basilar. The basilar artery is widely patent a patent right PICA, bilateral AICAs, and bilateral SCAs are identified. The basilar artery is widely patent. There is a small right posterior  communicating artery. Both PCAs are patent with branch vessel irregularity but no significant proximal stenosis. No aneurysm is identified. Venous sinuses: Patent. Anatomic variants: None. Review of the MIP images confirms the above findings IMPRESSION: 1. No emergent large vessel occlusion. 2. Mild intracranial atherosclerosis without flow limiting proximal stenosis. 3. Unchanged 2 mm right MCA  bifurcation aneurysm. 4. Predominantly right-sided cervical carotid artery atherosclerosis without significant stenosis. Previous left carotid endarterectomy. 5. 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 6.  Aortic Atherosclerosis (ICD10-I70.0). These results were communicated to Dr. Cheral Marker at 2:58 p.m.on 02/11/2019 by text page via the [facility] messaging system. Electronically Signed   By: Logan Bores M.D.   On: 02/11/2019 15:46   [name] Contrast  Result Date: 02/11/2019 CLINICAL DATA:  78 y/o F; decreased vision in the right eye, right-sided tingling, right-side heaviness, headache, dizziness. EXAM: MRI HEAD WITHOUT CONTRAST TECHNIQUE: Multiplanar, multiecho pulse sequences of the brain and surrounding structures were obtained without intravenous contrast. COMPARISON:  02/11/2019 CT head and CTA head.  12/09/2018 MRI head. FINDINGS: Brain: No acute infarction, hemorrhage, hydrocephalus, extra-axial collection or mass lesion. Few punctate nonspecific foci of T2 FLAIR hyperintense signal abnormality in white matter of unlikely significance given age. Mild volume loss of the brain. Few stable scattered punctate foci of susceptibility hypointensity compatible hemosiderin deposition of chronic microhemorrhage in a nonspecific distribution. Vascular: Normal flow voids. Skull and upper cervical spine: Normal marrow signal. Sinuses/Orbits: Negative. Other: Right intra-ocular lens replacement. IMPRESSION: No acute intracranial abnormality. Stable unremarkable MRI of the brain for age. Electronically Signed   By: Kristine Garbe M.D.   On: 02/11/2019 21:30   Dg Chest Port 1 View  Result Date: 02/11/2019 CLINICAL DATA:  Initial evaluation for acute chest pain. EXAM: PORTABLE CHEST 1 VIEW COMPARISON:  Prior CT from 08/25/2018 FINDINGS: Median sternotomy wires underlying CABG markers and surgical clips noted. Transverse heart size within normal limits.  Mediastinal silhouette normal. Lungs normally inflated. Mild streaky right basilar opacity, presumably reflecting chronic post radiation changes as previously seen. No other focal airspace disease. No pulmonary edema or pleural effusion. No pneumothorax. Mild irregular biapical pleural thickening noted. No acute osseous abnormality. IMPRESSION: 1. Mild streaky right basilar opacity, most likely reflecting chronic post radiation changes as seen on previous CT. 2. No other active cardiopulmonary disease. Electronically Signed   By: Jeannine Boga M.D.   On: 02/11/2019 15:14   Ct Head Code Stroke Wo Contrast  Result Date: 02/11/2019 CLINICAL DATA:  Code stroke.  Right-sided weakness. EXAM: CT HEAD WITHOUT CONTRAST TECHNIQUE: Contiguous axial images were obtained from the base of the skull through the vertex without intravenous contrast. COMPARISON:  Brain MRI 12/09/2018 FINDINGS: Brain: There is no evidence of acute infarct, intracranial hemorrhage, mass, midline shift, or extra-axial fluid collection. Mild cerebral atrophy is within normal limits for age. Vascular: Calcified atherosclerosis at the skull base. No hyperdense vessel. Skull: Previous suboccipital craniectomy. No fracture or suspicious osseous lesion. Sinuses/Orbits: Paranasal sinuses and mastoid air cells are clear. Right cataract extraction. Other: None. ASPECTS [facility] Stroke Program Early CT Score) - Ganglionic level infarction (caudate, lentiform nuclei, internal capsule, insula, M1-M3 cortex): 7 - Supraganglionic infarction (M4-M6 cortex): 3 Total score (0-10 with 10 being normal): 10 IMPRESSION: 1. Unremarkable CT appearance of the brain for age. 2. ASPECTS is 10. These results were called by telephone at the time of interpretation on 02/11/2019 at 2:14 pm to Dr. Kerney Elbe, who verbally acknowledged these results.  Electronically Signed   By: Logan Bores M.D.   On: 02/11/2019 14:15    PHYSICAL EXAM  Temp:  [97.6 F (36.4 C)-98.4 F  (36.9 C)] 98.2 F (36.8 C) (07/02 1152) Pulse Rate:  [47-68] 63 (07/02 1152) Resp:  [13-22] 16 (07/02 1152) BP: (104-184)/(44-91) 125/56 (07/02 1152) SpO2:  [94 %-100 %] 96 % (07/02 1152) FiO2 (%):  [21 %] 21 % (07/01 1754)  General - Well nourished, well developed, in no apparent distress.  Ophthalmologic - fundi not visualized due to noncooperation.  Cardiovascular - Regular rate and rhythm.  Mental Status -  Level of arousal and orientation to time, place, and person were intact. Language including expression, naming, repetition, comprehension was assessed and found intact. Fund of Knowledge was assessed and was intact.  Cranial Nerves II - XII - II - Visual field intact OU. III, IV, VI - Extraocular movements intact. V - Facial sensation intact bilaterally. VII - Facial movement intact bilaterally. VIII - Hearing & vestibular intact bilaterally. X - Palate elevates symmetrically. XI - Chin turning & shoulder shrug intact bilaterally. XII - Tongue protrusion intact.  Motor Strength - The patient's strength was normal in all extremities and pronator drift was absent.  Bulk was normal and fasciculations were absent.   Motor Tone - Muscle tone was assessed at the neck and appendages and was normal.  Reflexes - The patient's reflexes were symmetrical in all extremities and she had no pathological reflexes.  Sensory - Light touch, temperature/pinprick were assessed and were symmetrical.    Coordination - The patient had normal movements in the hands with no ataxia or dysmetria.  Tremor was absent.  Gait and Station - deferred.   ASSESSMENT/PLAN [name] is a 78 y.o. female with history of PVD, HTN, HLD, lung cancer, glaucoma, MI, CEA and B occipital neuralgia presenting with R face, arm, leg weakness and decreased sensation.   Likely complicated migraine with stress/anxiety  Code Stroke CT head No acute abnormality. ASPECTS 10.     CTA head & neck no LVO.   Unchanged R MCA bifurcation aneurysm.  R cervical ICA atherosclerosis without stenosis.  Old L CEA.  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  Aortic atherosclerosis.  MRI no acute abnormality  CUS 08/2018 right 40-59% stenosi  2D Echo EF 60 to 65%.  LA mildly dilated.  No source of embolus.  LDL 74  HgbA1c 5.5  Lovenox 40 mg subcu daily for VTE prophylaxis  clopidogrel 75 mg daily prior to admission, now on clopidogrel 75 mg daily.  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 Jannifer [facility] at [facility]  On gabapentin for headache prevention  Hypertension  Stable . Long-term BP goal normotensive  Hyperlipidemia  Home meds: Crestor 10, resumed in hospital  LDL 74, goal < 70  Continue statin at discharge  Other Stroke Risk Factors  Advanced age  Former Cigarette smoker, quit 18 yrs ago  Obesity, Body mass index is 30.85 kg/m., recommend weight loss, diet and exercise as appropriate   Family hx stroke (son, mom and brother)   Family hx cerebral aneurysm (brother & sister)  Coronary artery disease s.p MI  PVD w stents in legs  Other Active Problems  Hx lung cancer  GERD  Neuropathy   Hospital day # 0  Neurology will sign off. Please call with questions. Pt will follow up with Dr. Jannifer Franklin at [facility] in about 4 weeks. Thanks for the consult.  Rosalin Hawking, MD PhD Stroke Neurology 02/12/2019 2:37 PM   To contact Stroke Continuity provider, please refer to [email or web address] After hours, contact General Neurology

## 2019-02-12 NOTE — Care Management Obs Status (Signed)
[facility] NOTIFICATION   Patient Details  Name: [name] MRN: [id number] Date of Birth: 01/26/1941   Medicare Observation Status Notification Given:  Yes    Marilu Favre, RN 02/12/2019, 2:25 PM

## 2019-02-12 NOTE — Progress Notes (Signed)
  Echocardiogram 2D Echocardiogram has been performed.  Burnett Kanaris 02/12/2019, 10:34 AM

## 2019-02-12 NOTE — Evaluation (Signed)
Speech Language Pathology Evaluation Patient Details Name: [name] MRN: [id number] DOB: 04-16-1941 Today's Date: 02/12/2019 Time: 4098-1191 SLP Time Calculation (min) (ACUTE ONLY): 22 min  Problem List:  Patient Active Problem List   Diagnosis Date Noted  . TIA (transient ischemic attack) 02/11/2019  . Headache syndrome 11/27/2018  . GI bleed 07/15/2018  . Occult blood positive stool 07/14/2018  . History of shingles 06/23/2018  . Neuralgia and neuritis 06/23/2018  . Costochondritis, acute 04/18/2018  . Hyperkalemia 02/06/2017  . Physical deconditioning 11/08/2016  . Acute lower GI bleeding   . BRBPR (bright red blood per rectum) 09/15/2016  . Iron deficiency anemia 06/07/2016  . Bronchitis, acute 06/07/2016  . 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 Primary cancer of right lower lobe of lung ([place]) 04/25/2016  . 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 Foot pain, right 10/24/2015  . CAP (community acquired pneumonia) 10/22/2015  . UTI (urinary tract infection) 10/22/2015  . COPD exacerbation ([place]) 10/22/2015  . Neck pain on right side 01/05/2015  . Chest pain 08/02/2014  . Intractable headache 08/02/2014  . HLD (hyperlipidemia) 08/02/2014  . Essential hypertension 08/02/2014  . GERD (gastroesophageal reflux disease) 08/02/2014  . Leg pain, right 08/02/2014  . HA (headache)   . Carotid artery stenosis 12/09/2013  . 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  . Dehydration 04/02/2013  . CAD (coronary artery disease) 04/02/2013  . AKI (acute kidney injury) ([place]) 04/01/2013  . Nausea & vomiting 04/01/2013  . Diarrhea 04/01/2013  . Hypokalemia 04/01/2013  . Hyponatremia 04/01/2013  . Peripheral vascular disease ([place]) 01/07/2013  . Occlusion and stenosis of carotid artery without mention of cerebral infarction 01/07/2013  . Dizziness and giddiness 01/07/2013  . Shortness of breath 01/07/2013  . Cough 11/17/2012  . COPD GOLD II 11/17/2012   Past Medical History:  Past Medical History:   Diagnosis Date  . 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[place])   . 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 Asthma   . 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  . Blood in stool    last week of aug 2018  . Carotid artery occlusion   . Complication of anesthesia   . COPD (chronic obstructive pulmonary disease) ([place])   . Coronary artery disease   . 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 Diverticulitis   . Dyspnea    with exertion  . Gastroesophageal reflux disease    occ  . Glaucoma    right eye  . Headache    tension  . Headache syndrome 11/27/2018  . Hiatal hernia   . Hyperlipidemia   . Hypertension   . Iliac artery aneurysm ([place])   . 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 Myocardial infarction [facility]) 01/01/2000   Cardiac catheterization  . Peripheral vascular disease ([place])    stents in legs x 2 or 3  . Pneumonia    last time winter 2017 -2018  . PONV (postoperative nausea and vomiting)    occassionally, last colonscopy did ok with anesthesia  . Reflux    Past Surgical History:  Past Surgical History:  Procedure Laterality Date  . ABDOMINAL HYSTERECTOMY    . APPENDECTOMY    . Arnold-chiari malformation repair  1998   Suboccipital craniectomy  . CAROTID ENDARTERECTOMY  03/29/2010   Left  CEA  . CHOLECYSTECTOMY     Gall Bladder  . COLONOSCOPY WITH PROPOFOL N/A 04/22/2015   Procedure: COLONOSCOPY WITH  PROPOFOL;  Surgeon: Carol Ada, MD;  Location: [facility];  Service: Endoscopy;  Laterality: N/A;  . COLONOSCOPY WITH PROPOFOL N/A 05/25/2016   Procedure: COLONOSCOPY WITH PROPOFOL;  Surgeon: Carol Ada, MD;  Location: [facility];  Service: Endoscopy;  Laterality: N/A;  . COLONOSCOPY WITH PROPOFOL N/A 05/03/2017   Procedure: COLONOSCOPY WITH PROPOFOL;  Surgeon: Carol Ada, MD;  Location: [facility];  Service: Endoscopy;  Laterality: N/A;  . CORNEAL TRANSPLANT     Right  . CORONARY  ARTERY BYPASS GRAFT  01/01/2000   x 3  . ENTEROSCOPY N/A 02/27/2018   Procedure: ENTEROSCOPY;  Surgeon: Carol Ada, MD;  Location: [facility];  Service: Endoscopy;  Laterality: N/A;  . ENTEROSCOPY N/A 07/18/2018   Procedure: ENTEROSCOPY;  Surgeon: Carol Ada, MD;  Location: [facility];  Service: Endoscopy;  Laterality: N/A;  . ESOPHAGOGASTRODUODENOSCOPY N/A 05/25/2016   Procedure: ESOPHAGOGASTRODUODENOSCOPY (EGD);  Surgeon: Carol Ada, MD;  Location: [facility];  Service: Endoscopy;  Laterality: N/A;  . ESOPHAGOGASTRODUODENOSCOPY N/A 08/01/2018   Procedure: ESOPHAGOGASTRODUODENOSCOPY (EGD);  Surgeon: Carol Ada, MD;  Location: [facility];  Service: Endoscopy;  Laterality: N/A;  . EYE SURGERY Right 1995 or 1996   Laser surgery for retinal hemorrhage  . GIVENS CAPSULE STUDY N/A 07/16/2018   Procedure: GIVENS CAPSULE STUDY;  Surgeon: Carol Ada, MD;  Location: [facility];  Service: Endoscopy;  Laterality: N/A;  . HOT HEMOSTASIS N/A 02/27/2018   Procedure: HOT HEMOSTASIS (ARGON PLASMA COAGULATION/BICAP);  Surgeon: Carol Ada, MD;  Location: [facility];  Service: Endoscopy;  Laterality: N/A;  . HOT HEMOSTASIS N/A 08/01/2018   Procedure: HOT HEMOSTASIS (ARGON PLASMA COAGULATION/BICAP);  Surgeon: Carol Ada, MD;  Location: [facility];  Service: Endoscopy;  Laterality: N/A;  . IR RADIOLOGIST EVAL & MGMT  12/14/2016  . LEFT HEART CATH AND CORS/GRAFTS ANGIOGRAPHY N/A 01/09/2018   Procedure: LEFT HEART CATH AND CORS/GRAFTS ANGIOGRAPHY;  Surgeon: Charolette Forward, MD;  Location: [facility];  Service: Cardiovascular;  Laterality: N/A;  . LEFT HEART CATHETERIZATION WITH CORONARY ANGIOGRAM N/A 08/03/2014   Procedure: LEFT HEART CATHETERIZATION WITH CORONARY ANGIOGRAM;  Surgeon: Birdie Riddle, MD;  Location: [facility];  Service: Cardiovascular;  Laterality: N/A;  . Post Coronary Artery  BPG  01/05/2000   Right jugular sheath removed  . PR VEIN BYPASS GRAFT,AORTO-FEM-POP     . ROTATOR CUFF REPAIR     Right   HPI:  Pt is a 78 y.o. female with Past medical history of CAD, COPD, carotid stenosis, GERD and GI bleed who presented with complaints of resident numbness and right eye vision loss. MRI of the brain was negative for acute changes.    Assessment / Plan / Recommendation Clinical Impression  Pt reported that she has been living with her son and his family since she was diagnosed with cancer but she denied any baseline deficits in speech, language or cognition. She stated that she was talking "thick tongued when the spell started" but stated that this has resolved. Her son contacted her during the session and he agreed that the pt's speech is back to her baseline. Her speech and language skills are currently within normal limits and no overt cognitive deficits were noted. Further skilled SLP services are not clinically indicated at this time. Pt, and nursing were educated regarding results and recommendations; both parties verbalized understanding as well as agreement with plan of care.    SLP Assessment  SLP Recommendation/Assessment: Patient does not need any further Speech Lanaguage Pathology Services SLP Visit Diagnosis:  Dysarthria and anarthria (R47.1)    Follow Up Recommendations  None    Frequency and Duration           SLP Evaluation Cognition  Overall Cognitive Status: Within Functional Limits for tasks assessed Arousal/Alertness: Awake/alert Orientation Level: Oriented X4 Attention: Focused;Sustained Focused Attention: Appears intact Sustained Attention: Appears intact Memory: Appears intact(Immediate: 3/3; Delayed: 2/3; with cue: 1/1) Awareness: Appears intact Problem Solving: Appears intact(5/5) Executive Function: Sequencing Sequencing: Appears intact       Comprehension  Auditory Comprehension Overall Auditory Comprehension: Appears within functional limits for tasks assessed Yes/No Questions: Within Functional Limits Basic  Biographical Questions: (5/5) Complex Questions: (5/5) Paragraph Comprehension (via yes/no questions): (3/4) Commands: Within Functional Limits Two Step Basic Commands: (4/4) Multistep Basic Commands: (3/3) Conversation: Complex Visual Recognition/Discrimination Discrimination: Within Function Limits Reading Comprehension Reading Status: Within funtional limits    Expression Expression Primary Mode of Expression: Verbal Verbal Expression Overall Verbal Expression: Appears within functional limits for tasks assessed Initiation: No impairment Automatic Speech: Counting;Day of week;Month of year(WNL) Level of Generative/Spontaneous Verbalization: Conversation Repetition: No impairment Naming: No impairment Responsive: (5/5) Confrontation: Within functional limits(10/10) Convergent: (Sentence completion: 5/5) Pragmatics: No impairment   Oral / Motor  Oral Motor/Sensory Function Overall Oral Motor/Sensory Function: Within functional limits Motor Speech Overall Motor Speech: Appears within functional limits for tasks assessed Respiration: Within functional limits Phonation: Normal Resonance: Within functional limits Articulation: Within functional limitis Intelligibility: Intelligible Motor Planning: Witnin functional limits Motor Speech Errors: Not applicable   Spencer Peterkin I. Hardin Negus, [place], [place] Office number [phone] Pager [phone]'                    Horton Marshall 02/12/2019, 11:34 AM

## 2019-02-12 NOTE — Evaluation (Signed)
Physical Therapy Evaluation Patient Details Name: [name] MRN: [id number] DOB: Apr 15, 1941 Today's Date: 02/12/2019   History of Present Illness  Pt is a 79 y.o. female with history of PVD, MI, lung cancer, HTN, hyperlipidemia, glaucoma, carotid artery occlusion and bilateral occipital neuralgia.  Patient presented with decreased vision in her right eye, tingling in her right face which went down to her arm and leg on the right (they also felt heavy) she also had a headache/dizziness.  When she got to the ED she still had right facial decreased sensation and mild right arm drift; however, her slurred speech had resolved and she felt as though the weakness had resolved on the right side. MRI negative for acute abnormality  Clinical Impression  Pt was able to get up and walk down the hallway with me with min guard assist for gait.  She did have significant DOE and decreased sats on RA to 90% during gait, rebounding quickly with seated rest.  She has a supportive family and will progress well enough to d/c home, but would benefit from home therapy to help her work on her activity tolerance and monitor her vitals.   PT to follow acutely for deficits listed below.      Follow Up Recommendations Home health PT;Supervision - Intermittent    Equipment Recommendations  None recommended by PT    Recommendations for Other Services    NA     Precautions / Restrictions Precautions Precautions: Fall;Other (comment) Precaution Comments: monitor O2 sats with mobility, mildly unsteady on her feet.       Mobility  Bed Mobility Overal bed mobility: Independent                Transfers Overall transfer level: Needs assistance Equipment used: None Transfers: Sit to/from Stand Sit to Stand: Supervision         General transfer comment: supervision for safety  Ambulation/Gait Ambulation/Gait assistance: Min guard Gait Distance (Feet): 130 Feet Assistive device: 1 person hand held  assist Gait Pattern/deviations: Step-through pattern;Staggering right;Staggering left Gait velocity: decreased Gait velocity interpretation: <1.8 ft/sec, indicate of risk for recurrent falls General Gait Details: Pt with mildly staggering gait pattern, min guard light hand held assist for balance, DOE 2/4 with gait and O2 sats decreased to 90% while walking (reports primary lung CA), with seated rest quickly rebounded to 96% on RA.        Modified Rankin (Stroke Patients Only) Modified Rankin (Stroke Patients Only) Pre-Morbid Rankin Score: Slight disability Modified Rankin: Moderately severe disability     Balance Overall balance assessment: Needs assistance Sitting-balance support: No upper extremity supported;Feet supported Sitting balance-Leahy Scale: Good     Standing balance support: Single extremity supported Standing balance-Leahy Scale: Fair                               Pertinent Vitals/Pain Pain Assessment: Faces Faces Pain Scale: Hurts even more Pain Location: R sided rib pain "it is the cancer" Pain Descriptors / Indicators: Guarding;Grimacing Pain Intervention(s): Limited activity within patient's tolerance;Monitored during session;Repositioned    Home Living Family/patient expects to be discharged to:: Private residence Living Arrangements: Children;Other relatives(grandchildren (teens)) Available Help at Discharge: Family;Available 24 hours/day Type of Home: House Home Access: Stairs to enter Entrance Stairs-Rails: Psychiatric nurse of Steps: 8 Home Layout: One level Home Equipment: Walker - 2 wheels;Cane - single point;Shower seat(has them, but doesn't use them) Additional Comments: Not on home  O2, no longer drives, does house work.     Prior Function Level of Independence: Independent         Comments: Per pt report she does some light housework and cooking when her children don't (they pretty much do everything for her,  but she gets away with doing some things when they are not there)/      Hand Dominance   Dominant Hand: Right    Extremity/Trunk Assessment   Upper Extremity Assessment Upper Extremity Assessment: Defer to OT evaluation    Lower Extremity Assessment Lower Extremity Assessment: Overall WFL for tasks assessed(seated MMT 5/5)    Cervical / Trunk Assessment Cervical / Trunk Assessment: Normal  Communication   Communication: No difficulties  Cognition Arousal/Alertness: Awake/alert Behavior During Therapy: WFL for tasks assessed/performed Overall Cognitive Status: Within Functional Limits for tasks assessed                                 General Comments: Not formally tested, but conversation is normal.              Assessment/Plan    PT Assessment Patient needs continued PT services  PT Problem List Decreased strength;Decreased activity tolerance;Decreased balance;Decreased mobility;Decreased range of motion;Decreased knowledge of use of DME;Decreased knowledge of precautions;Cardiopulmonary status limiting activity;Pain       PT Treatment Interventions DME instruction;Stair training;Gait training;Functional mobility training;Therapeutic activities;Therapeutic exercise;Balance training;Neuromuscular re-education;Cognitive remediation;Patient/family education    PT Goals (Current goals can be found in the Care Plan section)  Acute Rehab PT Goals Patient Stated Goal: to go home, keep her independence PT Goal Formulation: With patient Time For Goal Achievement: 02/26/19 Potential to Achieve Goals: Good    Frequency Min 4X/week           AM-PAC PT "6 Clicks" Mobility  Outcome Measure Help needed turning from your back to your side while in a flat bed without using bedrails?: None Help needed moving from lying on your back to sitting on the side of a flat bed without using bedrails?: None Help needed moving to and from a bed to a chair (including a  wheelchair)?: A Little Help needed standing up from a chair using your arms (e.g., wheelchair or bedside chair)?: A Little Help needed to walk in hospital room?: A Little Help needed climbing 3-5 steps with a railing? : A Little 6 Click Score: 20    End of Session Equipment Utilized During Treatment: Gait belt Activity Tolerance: Patient limited by fatigue;Patient limited by pain Patient left: in chair;with call bell/phone within reach   PT Visit Diagnosis: Muscle weakness (generalized) (M62.81);Difficulty in walking, not elsewhere classified (R26.2);Pain Pain - Right/Left: Right Pain - part of body: (ribs)    Time: 1135-1150 PT Time Calculation (min) (ACUTE ONLY): 15 min   Charges:      Wells Guiles B. Raveena Hebdon, PT, DPT  Acute Rehabilitation [phone] pager [phone]) [phone] office  @ [facility]: [phone]   PT Evaluation $PT Eval Moderate Complexity: 1 Mod         02/12/2019, 12:05 PM

## 2019-02-14 NOTE — Discharge Summary (Signed)
[facility] Discharge Summary   Patient: [name] XBJ:[id number]   PCP: [name], MD DOB: 06-17-1941   Date of admission: 02/11/2019   Date of discharge: 02/12/2019     Discharge Diagnoses:  Complex migraine, TIA CVA ruled out. Principal Problem:   TIA (transient ischemic attack) Active Problems:   Peripheral vascular disease (HCC)   CAD (coronary artery disease)   Carotid artery stenosis   S/P CABG x 3   Primary cancer of right lower lobe of lung ([place])  Admitted From: home Disposition:  Home home health  Recommendations for Outpatient Follow-up:  1. Please follow-up with PCP in 1 week.  Follow-up Information    [name], MD. Schedule an appointment as soon as possible for a visit in 1 week(s).   Specialty: Family Medicine Contact information: [address] [place] [address] [phone]        [name], MD. Schedule an appointment as soon as possible for a visit in 4 week(s).   Specialty: Neurology Contact information: [address] [place] [address] [phone]        Home, [facility] Follow up.   Specialty: Home Health Services Why: home health PT  Contact information: [address] [place] [address] [phone]          Diet recommendation: Cardiac diet  Activity: The patient is advised to gradually reintroduce usual activities,as tolerated .  Discharge Condition: good  Code Status: DNR/DNI  History of present illness: As per the H and P dictated on admission, "[name] is a 78 y.o. female with Past medical history of CAD, COPD, carotid stenosis, GERD and GI bleed. Patient presents with complaints of resident numbness.  She also had right eye vision loss. Also had some tingling as well. Felt her right arm and right leg were heavy as well. Symptoms started around 1 PM. By the time she was seen in the emergency room her symptoms were  resolved. At the time of my evaluation she did not have any acute complaint. Rapidity of the improvement as the patient was considered not a candidate for TPA. Denies any nausea or vomiting denies any falls or trauma or injury. Reports that she actually had similar numbness on and off in last 1 week and actually has discussed with her PCP who recommended MRI outpatient. No fever no chills reported.  No cough.  No shortness of breath."  Hospital Course:  Summary of her active problems in the hospital is as following. Likely complicated migraine with stress/anxiety  Code Stroke CT head No acute abnormality. ASPECTS 10.     CTA head & neck no LVO.  Unchanged R MCA bifurcation aneurysm.  R cervical ICA atherosclerosis without stenosis.  Old L CEA.  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  Aortic atherosclerosis.  MRI no acute abnormality  CUS 08/2018 right 40-59% stenosis  2D Echo EF 60 to 65%.  LA mildly dilated.  No source of embolus.  LDL 74  HgbA1c 5.5  clopidogrel 75 mg daily prior to admission, now on clopidogrel 75 mg daily.  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 Jannifer Franklin at [facility]  On gabapentin for headache prevention  Hypertension  Stable  Long-term BP goal normotensive  Hyperlipidemia  Home meds: Crestor 10, resumed in hospital  LDL 74, goal < 70  Continue statin at discharge  Other Stroke Risk Factors  Advanced age  Former Cigarette smoker, quit 18 yrs ago  Obesity, Body mass index is 30.85 kg/m., recommend weight loss, diet and exercise as appropriate   Family hx stroke (son, mom and brother)   Family hx cerebral aneurysm (brother & sister)  Coronary artery disease s.p MI  PVD w stents in legs  Hx lung cancer Continue outpatient follow-up.  GERD History  of GI bleed. Continue PPI.  Neuropathy  Continue gabapentin.   Patient was *seen by physical therapy, who recommended Home health, which was arranged by case manager. On the day of the discharge the patient's vitals were stable , and no other acute medical condition were reported by patient. the patient was felt safe to be discharge at Home with Home health.  Consultants: neurology Procedures: Echocardiogram   DISCHARGE MEDICATION: Allergies as of 02/12/2019      Reactions   Azithromycin Swelling   Patient reported past history of lip swelling   Codeine Other (See Comments)   Dr. Terrence Dupont advised patient not to take this medication   Doxycycline Swelling   Mouth, lips, feet swelling   Hydromorphone Palpitations, Other (See Comments)   DILAUDID  -  Pt had a Heart Attack after taking Dilaudid.   Levaquin [levofloxacin] Other (See Comments)   Chest pressure, SOB, "pain in between shoulder blades", sweaty -as reported by patient per experience in ED this afternoon   Avelox [moxifloxacin Hcl In Nacl] Palpitations   Caused Heart Attack    Oxycodone-acetaminophen Other (See Comments)   Says it makes her feel weird   Risedronate Other (See Comments)   Chest pain      Medication List    TAKE these medications   acetaminophen 325 MG tablet Commonly known as: TYLENOL Take 650 mg by mouth every 6 (six) hours as needed for moderate pain or headache.   amLODipine-olmesartan 5-40 MG tablet Commonly known as: AZOR Take 1 tablet by mouth daily.   cetirizine 10 MG tablet Commonly known as: ZYRTEC Take 10 mg by mouth as needed for allergies.   clopidogrel 75 MG tablet Commonly known as: PLAVIX Take 1 tablet (75 mg total) by mouth daily.   dexlansoprazole 60 MG capsule Commonly known as: Dexilant Take 1 capsule (60 mg total) by mouth daily before breakfast.   ferrous sulfate 325 (65 FE) MG tablet Take 1 tablet (325 mg total) by mouth daily with breakfast.   furosemide 20 MG  tablet Commonly known as: LASIX Take 20 mg by mouth daily.   gabapentin 300 MG capsule Commonly known as: NEURONTIN Take 300 mg by mouth 3 (three) times daily. What changed: Another medication with the same name was removed. Continue taking this medication, and follow the directions you see here.   nitroGLYCERIN 0.4 MG SL tablet Commonly known as: NITROSTAT Place 0.4 mg under the tongue every 5 (five) minutes as needed for chest pain.   polyethylene glycol 17 g packet Commonly known as: MIRALAX / GLYCOLAX Take 17 g by mouth daily.   rosuvastatin 10 MG tablet Commonly known as: CRESTOR Take 10 mg by mouth at bedtime.   spironolactone 25 MG tablet Commonly known as: ALDACTONE Take 25 mg by mouth daily.   umeclidinium-vilanterol 62.5-25 MCG/INH Aepb Commonly known as: ANORO ELLIPTA Inhale 2 puffs into the lungs daily.      Allergies  Allergen Reactions  .  Azithromycin Swelling    Patient reported past history of lip swelling  . Codeine Other (See Comments)    Dr. Terrence Dupont advised patient not to take this medication  . Doxycycline Swelling    Mouth, lips, feet swelling  . Hydromorphone Palpitations and Other (See Comments)    DILAUDID  -  Pt had a Heart Attack after taking Dilaudid.  . Levaquin [Levofloxacin] Other (See Comments)    Chest pressure, SOB, "pain in between shoulder blades", sweaty -as reported by patient per experience in ED this afternoon  . Avelox [Moxifloxacin Hcl In Nacl] Palpitations    Caused Heart Attack   . Oxycodone-Acetaminophen Other (See Comments)    Says it makes her feel weird  . Risedronate Other (See Comments)    Chest pain   Discharge Instructions    Ambulatory referral to Neurology   Complete by: As directed    An appointment is requested in approximately: 4 weeks   Diet - low sodium heart healthy   Complete by: As directed    Discharge instructions   Complete by: As directed    It is important that you read the given instructions  as well as go over your medication list with RN to help you understand your care after this hospitalization.  Discharge Instructions: Please follow-up with PCP in 1-2 weeks  Please request your primary care physician to go over all Hospital Tests and Procedure/Radiological results at the follow up. Please get all Hospital records sent to your PCP by signing hospital release before you go home.   Do not take more than prescribed Pain, Sleep and Anxiety Medications. You were cared for by a hospitalist during your hospital stay. If you have any questions about your discharge medications or the care you received while you were in the hospital after you are discharged, you can call the unit @UNIT @ you were admitted to and ask to speak with the hospitalist on call if the hospitalist that took care of you is not available.  Once you are discharged, your primary care physician will handle any further medical issues. Please note that NO REFILLS for any discharge medications will be authorized once you are discharged, as it is imperative that you return to your primary care physician (or establish a relationship with a primary care physician if you do not have one) for your aftercare needs so that they can reassess your need for medications and monitor your lab values. You Must read complete instructions/literature along with all the possible adverse reactions/side effects for all the Medicines you take and that have been prescribed to you. Take any new Medicines after you have completely understood and accept all the possible adverse reactions/side effects. Wear Seat belts while driving. If you have smoked or chewed Tobacco in the last 2 yrs please stop smoking and/or stop any Recreational drug use.  If you drink alcohol, please moderate the use and do not drive, operating heavy machinery, perform activities at heights, swimming or participation in water activities or provide baby sitting services under  influence.   Increase activity slowly   Complete by: As directed      Discharge Exam: Filed Weights   02/11/19 1418  Weight: 76.5 kg   Vitals:   02/12/19 0857 02/12/19 1152  BP:  (!) 125/56  Pulse: (!) 59 63  Resp: 18 16  Temp:  98.2 F (36.8 C)  SpO2: 94% 96%   General: Appear in no distress, no Rash; Oral Mucosa Clear, moist. no Abnormal  Mass Or lumps Cardiovascular: S1 and S2 Present, no Murmur, Respiratory: normal respiratory effort, Bilateral Air entry present and Clear to Auscultation, no Crackles, no wheezes Abdomen: Bowel Sound present, Soft and no tenderness, no hernia Extremities: no Pedal edema, no calf tenderness Neurology: alert and oriented to time, place, and person affect appropriate. normal without focal findings, mental status, speech normal, alert and oriented x3, PERLA, Motor strength 5/5 and symmetric and sensation grossly normal to light touch   The results of significant diagnostics from this hospitalization (including imaging, microbiology, ancillary and laboratory) are listed below for reference.    Significant Diagnostic Studies: Ct Angio Head W Or Wo Contrast  Result Date: 02/11/2019 CLINICAL DATA:  Right facial numbness.  Right arm weakness. EXAM: CT ANGIOGRAPHY HEAD AND NECK TECHNIQUE: Multidetector CT imaging of the head and neck was performed using the standard protocol during bolus administration of intravenous contrast. Multiplanar CT image reconstructions and MIPs were obtained to evaluate the vascular anatomy. Carotid stenosis measurements (when applicable) are obtained utilizing NASCET criteria, using the distal internal carotid diameter as the denominator. CONTRAST:  55mL OMNIPAQUE IOHEXOL 350 MG/ML SOLN COMPARISON:  Head MRA 12/09/2018.  Head and neck CTA 02/17/2016. FINDINGS: CTA NECK FINDINGS Aortic arch: Normal variant aortic arch branching pattern with common origin of the brachiocephalic and left common carotid arteries. Mild arch  atherosclerosis without significant arch vessel origin stenosis. Right carotid system: Patent with moderate soft and calcified plaque in the carotid bulb resulting in less than 50% stenosis. Left carotid system: Patent without stenosis or dissection status post endarterectomy. Vertebral arteries: Patent with the left vertebral artery being moderately dominant. Moderate to severe right vertebral artery origin stenosis due to calcified plaque, stable to mildly progressed from 2017. Skeleton: Mild cervical disc degeneration. Other neck: No evidence of cervical lymphadenopathy or mass. Upper chest: Mild biapical pleuroparenchymal scarring. Review of the MIP images confirms the above findings CTA HEAD FINDINGS Anterior circulation: The internal carotid arteries are patent from skull base to carotid termini with unchanged mild right cavernous stenosis compared to 2017. An infundibulum at the right posterior communicating origin is unchanged. A 2 mm superiorly directed outpouching at the right MCA bifurcation is unchanged. ACAs and MCAs are patent without evidence of proximal branch occlusion or significant proximal stenosis. Posterior circulation: The intracranial vertebral arteries are widely patent to the basilar. The basilar artery is widely patent a patent right PICA, bilateral AICAs, and bilateral SCAs are identified. The basilar artery is widely patent. There is a small right posterior communicating artery. Both PCAs are patent with branch vessel irregularity but no significant proximal stenosis. No aneurysm is identified. Venous sinuses: Patent. Anatomic variants: None. Review of the MIP images confirms the above findings IMPRESSION: 1. No emergent large vessel occlusion. 2. Mild intracranial atherosclerosis without flow limiting proximal stenosis. 3. 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 4. Predominantly right-sided cervical carotid artery atherosclerosis without significant stenosis. Previous left  carotid endarterectomy. 5. 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 6.  Aortic Atherosclerosis (ICD10-I70.0). These results were communicated to Dr. Cheral Marker at 2:58 p.m.on 02/11/2019 by text page via the [facility] messaging system. Electronically Signed   By: Logan Bores M.D.   On: 02/11/2019 15:46   Ct Angio Neck W Or Wo Contrast  Result Date: 02/11/2019 CLINICAL DATA:  Right facial numbness.  Right arm weakness. EXAM: CT ANGIOGRAPHY HEAD AND NECK TECHNIQUE: Multidetector CT imaging of the head and neck was performed using the  standard protocol during bolus administration of intravenous contrast. Multiplanar CT image reconstructions and MIPs were obtained to evaluate the vascular anatomy. Carotid stenosis measurements (when applicable) are obtained utilizing NASCET criteria, using the distal internal carotid diameter as the denominator. CONTRAST:  48mL OMNIPAQUE IOHEXOL 350 MG/ML SOLN COMPARISON:  Head MRA 12/09/2018.  Head and neck CTA 02/17/2016. FINDINGS: CTA NECK FINDINGS Aortic arch: Normal variant aortic arch branching pattern with common origin of the brachiocephalic and left common carotid arteries. Mild arch atherosclerosis without significant arch vessel origin stenosis. Right carotid system: Patent with moderate soft and calcified plaque in the carotid bulb resulting in less than 50% stenosis. Left carotid system: Patent without stenosis or dissection status post endarterectomy. Vertebral arteries: Patent with the left vertebral artery being moderately dominant. Moderate to severe right vertebral artery origin stenosis due to calcified plaque, stable to mildly progressed from 2017. Skeleton: Mild cervical disc degeneration. Other neck: No evidence of cervical lymphadenopathy or mass. Upper chest: Mild biapical pleuroparenchymal scarring. Review of the MIP images confirms the above findings CTA HEAD FINDINGS Anterior circulation: The internal carotid  arteries are patent from skull base to carotid termini with unchanged mild right cavernous stenosis compared to 2017. An infundibulum at the right posterior communicating origin is unchanged. A 2 mm superiorly directed outpouching at the right MCA bifurcation is unchanged. ACAs and MCAs are patent without evidence of proximal branch occlusion or significant proximal stenosis. Posterior circulation: The intracranial vertebral arteries are widely patent to the basilar. The basilar artery is widely patent a patent right PICA, bilateral AICAs, and bilateral SCAs are identified. The basilar artery is widely patent. There is a small right posterior communicating artery. Both PCAs are patent with branch vessel irregularity but no significant proximal stenosis. No aneurysm is identified. Venous sinuses: Patent. Anatomic variants: None. Review of the MIP images confirms the above findings IMPRESSION: 1. No emergent large vessel occlusion. 2. Mild intracranial atherosclerosis without flow limiting proximal stenosis. 3. 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 4. Predominantly right-sided cervical carotid artery atherosclerosis without significant stenosis. Previous left carotid endarterectomy. 5. 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 6.  Aortic Atherosclerosis (ICD10-I70.0). These results were communicated to Dr. Cheral Marker at 2:58 p.m.on 02/11/2019 by text page via the [facility] messaging system. Electronically Signed   By: Logan Bores M.D.   On: 02/11/2019 15:46   [name] Contrast  Result Date: 02/11/2019 CLINICAL DATA:  78 y/o F; decreased vision in the right eye, right-sided tingling, right-side heaviness, headache, dizziness. EXAM: MRI HEAD WITHOUT CONTRAST TECHNIQUE: Multiplanar, multiecho pulse sequences of the brain and surrounding structures were obtained without intravenous contrast. COMPARISON:  02/11/2019 CT head and CTA head.  12/09/2018 MRI head.  FINDINGS: Brain: No acute infarction, hemorrhage, hydrocephalus, extra-axial collection or mass lesion. Few punctate nonspecific foci of T2 FLAIR hyperintense signal abnormality in white matter of unlikely significance given age. Mild volume loss of the brain. Few stable scattered punctate foci of susceptibility hypointensity compatible hemosiderin deposition of chronic microhemorrhage in a nonspecific distribution. Vascular: Normal flow voids. Skull and upper cervical spine: Normal marrow signal. Sinuses/Orbits: Negative. Other: Right intra-ocular lens replacement. IMPRESSION: No acute intracranial abnormality. Stable unremarkable MRI of the brain for age. Electronically Signed   By: Kristine Garbe M.D.   On: 02/11/2019 21:30   Dg Chest Port 1 View  Result Date: 02/11/2019 CLINICAL DATA:  Initial evaluation for acute chest pain. EXAM: PORTABLE CHEST 1 VIEW COMPARISON:  Prior CT from 08/25/2018 FINDINGS: Median sternotomy wires underlying CABG markers and surgical clips noted. Transverse heart size within normal limits. Mediastinal silhouette normal. Lungs normally inflated. Mild streaky right basilar opacity, presumably reflecting chronic post radiation changes as previously seen. No other focal airspace disease. No pulmonary edema or pleural effusion. No pneumothorax. Mild irregular biapical pleural thickening noted. No acute osseous abnormality. IMPRESSION: 1. Mild streaky right basilar opacity, most likely reflecting chronic post radiation changes as seen on previous CT. 2. No other active cardiopulmonary disease. Electronically Signed   By: Jeannine Boga M.D.   On: 02/11/2019 15:14   Ct Head Code Stroke Wo Contrast  Result Date: 02/11/2019 CLINICAL DATA:  Code stroke.  Right-sided weakness. EXAM: CT HEAD WITHOUT CONTRAST TECHNIQUE: Contiguous axial images were obtained from the base of the skull through the vertex without intravenous contrast. COMPARISON:  Brain MRI 12/09/2018 FINDINGS:  Brain: There is no evidence of acute infarct, intracranial hemorrhage, mass, midline shift, or extra-axial fluid collection. Mild cerebral atrophy is within normal limits for age. Vascular: Calcified atherosclerosis at the skull base. No hyperdense vessel. Skull: Previous suboccipital craniectomy. No fracture or suspicious osseous lesion. Sinuses/Orbits: Paranasal sinuses and mastoid air cells are clear. Right cataract extraction. Other: None. ASPECTS [facility] Stroke Program Early CT Score) - Ganglionic level infarction (caudate, lentiform nuclei, internal capsule, insula, M1-M3 cortex): 7 - Supraganglionic infarction (M4-M6 cortex): 3 Total score (0-10 with 10 being normal): 10 IMPRESSION: 1. Unremarkable CT appearance of the brain for age. 2. ASPECTS is 10. These results were called by telephone at the time of interpretation on 02/11/2019 at 2:14 pm to Dr. Kerney Elbe, who verbally acknowledged these results. Electronically Signed   By: Logan Bores M.D.   On: 02/11/2019 14:15    Microbiology: Recent Results (from the past 240 hour(s))  SARS Coronavirus 2 (CEPHEID - Performed in [facility] hospital lab), Hosp Order     Status: None   Collection Time: 02/11/19  5:53 PM   Specimen: Nasopharyngeal Swab  Result Value Ref Range Status   SARS Coronavirus 2 NEGATIVE NEGATIVE Final    Comment: (NOTE) If result is NEGATIVE SARS-CoV-2 target nucleic acids are NOT DETECTED. The SARS-CoV-2 RNA is generally detectable in upper and lower  respiratory specimens during the acute phase of infection. The lowest  concentration of SARS-CoV-2 viral copies this assay can detect is 250  copies / mL. A negative result does not preclude SARS-CoV-2 infection  and should not be used as the sole basis for treatment or other  patient management decisions.  A negative result may occur with  improper specimen collection / handling, submission of specimen other  than nasopharyngeal swab, presence of viral mutation(s) within  the  areas targeted by this assay, and inadequate number of viral copies  (<250 copies / mL). A negative result must be combined with clinical  observations, patient history, and epidemiological information. If result is POSITIVE SARS-CoV-2 target nucleic acids are DETECTED. The SARS-CoV-2 RNA is generally detectable in upper and lower  respiratory specimens dur ing the acute phase of infection.  Positive  results are indicative of active infection with SARS-CoV-2.  Clinical  correlation with patient history and other diagnostic information is  necessary to determine patient infection status.  Positive results do  not rule out bacterial infection or co-infection with other viruses. If result is PRESUMPTIVE POSTIVE SARS-CoV-2 nucleic acids MAY BE PRESENT.   A presumptive positive result was obtained on the submitted specimen  and confirmed on repeat  testing.  While 2019 novel coronavirus  (SARS-CoV-2) nucleic acids may be present in the submitted sample  additional confirmatory testing may be necessary for epidemiological  and / or clinical management purposes  to differentiate between  SARS-CoV-2 and other Sarbecovirus currently known to infect humans.  If clinically indicated additional testing with an alternate test  methodology [phone]) is advised. The SARS-CoV-2 RNA is generally  detectable in upper and lower respiratory sp ecimens during the acute  phase of infection. The expected result is Negative. Fact Sheet for Patients:  StrictlyIdeas.no Fact Sheet for Healthcare Providers: BankingDealers.co.za This test is not yet approved or cleared by the [place] FDA and has been authorized for detection and/or diagnosis of SARS-CoV-2 by FDA under an Emergency Use Authorization (EUA).  This EUA will remain in effect (meaning this test can be used) for the duration of the COVID-19 declaration under Section 564(b)(1) of the Act, 21  U.S.C. section 360bbb-3(b)(1), unless the authorization is terminated or revoked sooner. Performed at [facility], [place] [address], [place], [place] [address]      Labs: CBC: Recent Labs  Lab 02/11/19 1354 02/11/19 1401 02/12/19 0403  WBC 9.3  --  6.7  NEUTROABS 6.1  --   --   HGB 11.4* 11.2* 9.4*  HCT 35.3* 33.0* 29.3*  MCV 95.4  --  94.8  PLT 287  --  354   Basic Metabolic Panel: Recent Labs  Lab 02/11/19 1354 02/11/19 1401 02/12/19 0403  NA 137 137 139  K 4.6 4.6 4.6  CL 105 105 107  CO2 21*  --  25  GLUCOSE 128* 129* 97  BUN 16 18 18   CREATININE 1.55* 1.40* 1.38*  CALCIUM 9.6  --  9.2   Liver Function Tests: Recent Labs  Lab 02/11/19 1354  AST 21  ALT 13  ALKPHOS 63  BILITOT 0.7  PROT 7.5  ALBUMIN 3.8   No results for input(s): LIPASE, AMYLASE in the last 168 hours. No results for input(s): AMMONIA in the last 168 hours. Cardiac Enzymes: No results for input(s): CKTOTAL, CKMB, CKMBINDEX, TROPONINI in the last 168 hours. BNP (last 3 results) No results for input(s): BNP in the last 8760 hours. CBG: Recent Labs  Lab 02/11/19 1807  GLUCAP 94   Time spent: 35 minutes  Signed:  Berle Mull  [facility] 02/12/2019

## 2019-02-15 ENCOUNTER — Other Ambulatory Visit: Payer: Self-pay

## 2019-02-15 ENCOUNTER — Emergency Department (HOSPITAL_COMMUNITY)
Admission: EM | Admit: 2019-02-15 | Discharge: 2019-02-16 | Disposition: A | Discharge status: Home / Self Care | Payer: Medicare Other | Attending: Emergency Medicine | Admitting: Emergency Medicine

## 2019-02-15 ENCOUNTER — Encounter (HOSPITAL_COMMUNITY): Payer: Self-pay | Admitting: Emergency Medicine

## 2019-02-15 DIAGNOSIS — Z951 Presence of aortocoronary bypass graft: Secondary | ICD-10-CM | POA: Insufficient documentation

## 2019-02-15 DIAGNOSIS — J45909 Unspecified asthma, uncomplicated: Secondary | ICD-10-CM | POA: Diagnosis not present

## 2019-02-15 DIAGNOSIS — R2241 Localized swelling, mass and lump, right lower limb: Secondary | ICD-10-CM | POA: Diagnosis present

## 2019-02-15 DIAGNOSIS — I252 Old myocardial infarction: Secondary | ICD-10-CM | POA: Insufficient documentation

## 2019-02-15 DIAGNOSIS — J449 Chronic obstructive pulmonary disease, unspecified: Secondary | ICD-10-CM | POA: Diagnosis not present

## 2019-02-15 DIAGNOSIS — Z8673 Personal history of transient ischemic attack (TIA), and cerebral infarction without residual deficits: Secondary | ICD-10-CM | POA: Insufficient documentation

## 2019-02-15 DIAGNOSIS — Z7902 Long term (current) use of antithrombotics/antiplatelets: Secondary | ICD-10-CM | POA: Diagnosis not present

## 2019-02-15 DIAGNOSIS — Z87891 Personal history of nicotine dependence: Secondary | ICD-10-CM | POA: Insufficient documentation

## 2019-02-15 DIAGNOSIS — I10 Essential (primary) hypertension: Secondary | ICD-10-CM | POA: Diagnosis not present

## 2019-02-15 DIAGNOSIS — Z79899 Other long term (current) drug therapy: Secondary | ICD-10-CM | POA: Insufficient documentation

## 2019-02-15 DIAGNOSIS — I2510 Atherosclerotic heart disease of native coronary artery without angina pectoris: Secondary | ICD-10-CM | POA: Insufficient documentation

## 2019-02-15 DIAGNOSIS — M7989 Other specified soft tissue disorders: Secondary | ICD-10-CM

## 2019-02-15 MED ORDER — RIVAROXABAN 15 MG PO TABS
15.0000 mg | ORAL_TABLET | Freq: Once | ORAL | Status: AC
  Administered 2019-02-15: 15 mg via ORAL
  Filled 2019-02-15: qty 1

## 2019-02-15 NOTE — ED Notes (Signed)
Bed: WBH35 Expected date:  Expected time:  Means of arrival:  Comments: 

## 2019-02-15 NOTE — Discharge Instructions (Addendum)
Go to [facility] tomorrow morning for a doppler test to see if you have a blood clot in your leg.

## 2019-02-15 NOTE — ED Provider Notes (Signed)
[facility] DEPT Provider Note   CSN: [id number] Arrival date & time: 02/15/19  2151    History   Chief Complaint Chief Complaint  Patient presents with  . Leg Swelling    HPI [name] is a 78 y.o. female.   The history is provided by the patient.  She has history of hypertension, hyperlipidemia, lung cancer, peripheral vascular disease, COPD and noted swelling of her right foot and leg at about 7 PM.  She states that it felt cold to her.  She denies any actual pain.  She denies any trauma.  She spoke with her cardiologist who told her to come to the hospital to rule out DVT.  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  She is on clopidogrel, but no anticoagulants.  Past Medical History:  Diagnosis Date  . 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 Asthma   . 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  . Blood in stool    last week of aug 2018  . Carotid artery occlusion   . Complication of anesthesia   . COPD (chronic obstructive pulmonary disease) ([place])   . Coronary artery disease   . 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 Diverticulitis   . Dyspnea    with exertion  . Gastroesophageal reflux disease    occ  . Glaucoma    right eye  . Headache    tension  . Headache syndrome 11/27/2018  . Hiatal hernia   . Hyperlipidemia   . Hypertension   . Iliac artery aneurysm ([place])   . 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 Myocardial infarction [facility]) 01/01/2000   Cardiac catheterization  . Peripheral vascular disease ([place])    stents in legs x 2 or 3  . Pneumonia    last time winter 2017 -2018  . PONV (postoperative nausea and vomiting)    occassionally, last colonscopy did ok with anesthesia  . Reflux     Patient Active Problem List   Diagnosis Date  Noted  . TIA (transient ischemic attack) 02/11/2019  . Headache syndrome 11/27/2018  . GI bleed 07/15/2018  . Occult blood positive stool 07/14/2018  . History of shingles 06/23/2018  . Neuralgia and neuritis 06/23/2018  . Costochondritis, acute 04/18/2018  . Hyperkalemia 02/06/2017  . Physical deconditioning 11/08/2016  . Acute lower GI bleeding   . BRBPR (bright red blood per rectum) 09/15/2016  . Iron deficiency anemia 06/07/2016  . Bronchitis, acute 06/07/2016  . 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 Primary cancer of right lower lobe of lung ([place]) 04/25/2016  . 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 Foot pain, right 10/24/2015  . CAP (community acquired pneumonia) 10/22/2015  . UTI (urinary tract infection) 10/22/2015  . COPD exacerbation ([place]) 10/22/2015  . Neck pain on right side 01/05/2015  . Chest pain 08/02/2014  . Intractable headache 08/02/2014  . HLD (hyperlipidemia) 08/02/2014  . Essential hypertension 08/02/2014  . GERD (gastroesophageal reflux disease) 08/02/2014  . Leg pain, right 08/02/2014  . HA (headache)   . Carotid artery stenosis 12/09/2013  . 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  . Dehydration 04/02/2013  . CAD (coronary artery disease) 04/02/2013  . AKI (acute kidney injury) ([place]) 04/01/2013  . Nausea & vomiting 04/01/2013  . Diarrhea 04/01/2013  . Hypokalemia 04/01/2013  .  Hyponatremia 04/01/2013  . Peripheral vascular disease ([place]) 01/07/2013  . Occlusion and stenosis of carotid artery without mention of cerebral infarction 01/07/2013  . Dizziness and giddiness 01/07/2013  . Shortness of breath 01/07/2013  . Cough 11/17/2012  . COPD GOLD II 11/17/2012    Past Surgical History:  Procedure Laterality Date  . ABDOMINAL HYSTERECTOMY    . APPENDECTOMY    . Arnold-chiari malformation repair  1998   Suboccipital craniectomy  . CAROTID ENDARTERECTOMY  03/29/2010   Left  CEA  . CHOLECYSTECTOMY     Gall Bladder  . COLONOSCOPY WITH PROPOFOL N/A 04/22/2015    Procedure: COLONOSCOPY WITH PROPOFOL;  Surgeon: Carol Ada, MD;  Location: [facility] END[facility];  Service: Endoscopy;  Laterality: N/A;  . COLONOSCOPY WITH PROPOFOL N/A 05/25/2016   Procedure: COLONOSCOPY WITH PROPOFOL;  Surgeon: Carol Ada, MD;  Location: [facility];  Service: Endoscopy;  Laterality: N/A;  . COLONOSCOPY WITH PROPOFOL N/A 05/03/2017   Procedure: COLONOSCOPY WITH PROPOFOL;  Surgeon: Carol Ada, MD;  Location: [facility];  Service: Endoscopy;  Laterality: N/A;  . CORNEAL TRANSPLANT     Right  . CORONARY ARTERY BYPASS GRAFT  01/01/2000   x 3  . ENTEROSCOPY N/A 02/27/2018   Procedure: ENTEROSCOPY;  Surgeon: Carol Ada, MD;  Location: [facility];  Service: Endoscopy;  Laterality: N/A;  . ENTEROSCOPY N/A 07/18/2018   Procedure: ENTEROSCOPY;  Surgeon: Carol Ada, MD;  Location: [facility];  Service: Endoscopy;  Laterality: N/A;  . ESOPHAGOGASTRODUODENOSCOPY N/A 05/25/2016   Procedure: ESOPHAGOGASTRODUODENOSCOPY (EGD);  Surgeon: Carol Ada, MD;  Location: [facility];  Service: Endoscopy;  Laterality: N/A;  . ESOPHAGOGASTRODUODENOSCOPY N/A 08/01/2018   Procedure: ESOPHAGOGASTRODUODENOSCOPY (EGD);  Surgeon: Carol Ada, MD;  Location: [facility];  Service: Endoscopy;  Laterality: N/A;  . EYE SURGERY Right 1995 or 1996   Laser surgery for retinal hemorrhage  . GIVENS CAPSULE STUDY N/A 07/16/2018   Procedure: GIVENS CAPSULE STUDY;  Surgeon: Carol Ada, MD;  Location: [facility];  Service: Endoscopy;  Laterality: N/A;  . HOT HEMOSTASIS N/A 02/27/2018   Procedure: HOT HEMOSTASIS (ARGON PLASMA COAGULATION/BICAP);  Surgeon: Carol Ada, MD;  Location: [facility];  Service: Endoscopy;  Laterality: N/A;  . HOT HEMOSTASIS N/A 08/01/2018   Procedure: HOT HEMOSTASIS (ARGON PLASMA COAGULATION/BICAP);  Surgeon: Carol Ada, MD;  Location: [facility];  Service: Endoscopy;  Laterality: N/A;  . IR RADIOLOGIST EVAL & MGMT  12/14/2016  . LEFT HEART CATH AND CORS/GRAFTS  ANGIOGRAPHY N/A 01/09/2018   Procedure: LEFT HEART CATH AND CORS/GRAFTS ANGIOGRAPHY;  Surgeon: Charolette Forward, MD;  Location: [facility];  Service: Cardiovascular;  Laterality: N/A;  . LEFT HEART CATHETERIZATION WITH CORONARY ANGIOGRAM N/A 08/03/2014   Procedure: LEFT HEART CATHETERIZATION WITH CORONARY ANGIOGRAM;  Surgeon: Birdie Riddle, MD;  Location: [facility] CATH LAB;  Service: Cardiovascular;  Laterality: N/A;  . Post Coronary Artery  BPG  01/05/2000   Right jugular sheath removed  . PR VEIN BYPASS GRAFT,AORTO-FEM-POP    . ROTATOR CUFF REPAIR     Right     OB History   No obstetric history on file.      Home Medications    Prior to Admission medications   Medication Sig Start Date End Date Taking? Authorizing Provider  acetaminophen (TYLENOL) 325 MG tablet Take 650 mg by mouth every 6 (six) hours as needed for moderate pain or headache.     [provider]  amLODipine-olmesartan (AZOR) 5-40 MG tablet Take 1 tablet by mouth daily.    [provider]  cetirizine (ZYRTEC) 10 MG tablet Take 10 mg by mouth as needed for allergies.    [provider]  clopidogrel (PLAVIX) 75 MG tablet Take 1 tablet (75 mg total) by mouth daily. 04/02/12   Conrad Lisle, MD  dexlansoprazole (DEXILANT) 60 MG capsule Take 1 capsule (60 mg total) by mouth daily before breakfast. 11/17/12   Tanda Rockers, MD  ferrous sulfate 325 (65 FE) MG tablet Take 1 tablet (325 mg total) by mouth daily with breakfast. 07/19/18 02/11/19  Alma Friendly, MD  furosemide (LASIX) 20 MG tablet Take 20 mg by mouth daily. 01/20/19   [provider]  gabapentin (NEURONTIN) 300 MG capsule Take 300 mg by mouth 3 (three) times daily. 01/12/19   [provider]  nitroGLYCERIN (NITROSTAT) 0.4 MG SL tablet Place 0.4 mg under the tongue every 5 (five) minutes as needed for chest pain.     [provider]  polyethylene glycol (MIRALAX / GLYCOLAX) packet Take 17 g by mouth  daily. Patient not taking: Reported on 12/09/2018 07/18/18   Alma Friendly, MD  rosuvastatin (CRESTOR) 10 MG tablet Take 10 mg by mouth at bedtime.     [provider]  spironolactone (ALDACTONE) 25 MG tablet Take 25 mg by mouth daily. 03/25/17   [provider]  umeclidinium-vilanterol (ANORO ELLIPTA) 62.5-25 MCG/INH AEPB Inhale 2 puffs into the lungs daily. 03/14/17   [provider]    Family History Family History  Problem Relation Age of Onset  . Heart disease Mother        Heart Disease before age 11  . Hypertension Mother   . Hyperlipidemia Mother   . Heart attack Mother   . Clotting disorder Mother   . Heart disease Father        Heart Disease before age 29  . Heart attack Father   . Hyperlipidemia Father   . Hypertension Father   . Heart disease Brother        Heart Disease before age 2  . Hyperlipidemia Brother   . Hypertension Brother   . Clotting disorder Brother   . AAA (abdominal aortic aneurysm) Brother   . Cerebral aneurysm Sister   . Hypertension Sister   . AAA (abdominal aortic aneurysm) Sister   . Asthma Sister   . Cerebral aneurysm Brother   . Cancer Brother        Lung  . Hypertension Brother   . Heart attack Brother   . Heart disease Brother        Aneurysm of Brain  . Hypertension Brother   . Heart disease Brother   . Heart disease Brother   . Stroke Son        Aneurysm of Stomach  . AAA (abdominal aortic aneurysm) Son   . Cancer Maternal Uncle        great uncle/cancer/type unknown    Social History Social History   Tobacco Use  . Smoking status: Former Smoker    Packs/day: 1.50    Years: 30.00    Pack years: 45.00    Types: Cigarettes    Quit date: 08/13/2000    Years since quitting: 18.5  . Smokeless tobacco: Never Used  Substance Use Topics  . Alcohol use: No    Alcohol/week: 0.0 standard drinks  . Drug use: No     Allergies   Azithromycin, Codeine, Doxycycline, Hydromorphone, Levaquin  [levofloxacin], Avelox [moxifloxacin hcl in nacl], Oxycodone-acetaminophen, and Risedronate   Review of  Systems Review of Systems  All other systems reviewed and are negative.    Physical Exam Updated Vital Signs BP (!) 129/56 (BP Location: Right Arm)   Pulse 65   Temp 98.4 F (36.9 C) (Oral)   Resp 18   Ht 5\' 2"  (1.575 m)   Wt 80.7 kg   SpO2 95%   BMI 32.56 kg/m   Physical Exam Vitals signs and nursing note reviewed.    78 year old female, resting comfortably and in no acute distress. Vital signs are normal. Oxygen saturation is 95%, which is normal. Head is normocephalic and atraumatic. PERRLA, EOMI. Oropharynx is clear. Neck is nontender and supple without adenopathy or JVD. Back is nontender and there is no CVA tenderness. Lungs are clear without rales, wheezes, or rhonchi. Chest is nontender. Heart has regular rate and rhythm without murmur. Abdomen is soft, flat, nontender without masses or hepatosplenomegaly and peristalsis is normoactive. Extremities have trace edema, full range of motion is present.  Dorsalis pedis pulses are palpable bilaterally at 1+.  Capillary refill is 2 seconds bilaterally.  There is no temperature difference between the legs.  Right calf circumference is actually about half centimeter less than left calf circumference.  There is no palpable cord.  Homans sign is negative. Skin is warm and dry without rash. Neurologic: Mental status is normal, cranial nerves are intact, there are no motor or sensory deficits.  ED Treatments / Results   Procedures Procedures   Medications Ordered in ED Medications  Rivaroxaban (XARELTO) tablet 15 mg (has no administration in time range)     Initial Impression / Assessment and Plan / ED Course  I have reviewed the triage vital signs and the nursing notes.  Subjective swelling and coolness of the right leg, findings not corroborated on physical exam.  Old records reviewed confirming recent  hospitalization for code stroke, final diagnosis complicated migraine.  Although I see no signs of DVT, patient was sent here by her PCP with special concerns for DVT.  Because of her cancer history, she does have an increased risk for DVT.  She will be given a dose of rivaroxaban and be brought back for venous Doppler studies in the morning.  Final Clinical Impressions(s) / ED Diagnoses   Final diagnoses:  Leg swelling    ED Discharge Orders    None       Delora Fuel, MD 41/32/44 [id number]

## 2019-02-15 NOTE — ED Triage Notes (Addendum)
Patient c/o swelling to right ankle that she noticed today. Recently admitted at [facility] for stroke. Denies new SOB, chest pain. Denies pain or injury. Hx lung cancer.

## 2019-02-16 ENCOUNTER — Emergency Department (HOSPITAL_COMMUNITY)
Admission: EM | Admit: 2019-02-16 | Discharge: 2019-02-16 | Discharge status: Absent without leave | Payer: Medicare Other | Source: Home / Self Care

## 2019-02-16 ENCOUNTER — Ambulatory Visit (HOSPITAL_BASED_OUTPATIENT_CLINIC_OR_DEPARTMENT_OTHER)
Admission: RE | Admit: 2019-02-16 | Discharge: 2019-02-16 | Disposition: A | Discharge status: Home / Self Care | Payer: Medicare Other | Source: Ambulatory Visit | Attending: Emergency Medicine | Admitting: Emergency Medicine

## 2019-02-16 ENCOUNTER — Ambulatory Visit: Payer: Medicare Other | Admitting: Pulmonary Disease

## 2019-02-16 DIAGNOSIS — M7989 Other specified soft tissue disorders: Secondary | ICD-10-CM | POA: Diagnosis not present

## 2019-02-16 NOTE — Progress Notes (Signed)
Bilateral lower extremity venous duplex completed. Preliminary results in Chart review CV Proc. [facility], [place] 02/16/2019, 11:47 am

## 2019-02-18 ENCOUNTER — Encounter: Payer: Self-pay | Admitting: Neurology

## 2019-02-18 ENCOUNTER — Other Ambulatory Visit: Payer: Self-pay

## 2019-02-18 ENCOUNTER — Ambulatory Visit (INDEPENDENT_AMBULATORY_CARE_PROVIDER_SITE_OTHER): Payer: Medicare Other | Admitting: Neurology

## 2019-02-18 VITALS — BP 150/64 | HR 67 | Temp 97.3°F | Ht 62.0 in | Wt 170.2 lb

## 2019-02-18 DIAGNOSIS — G4489 Other headache syndrome: Secondary | ICD-10-CM | POA: Diagnosis not present

## 2019-02-18 DIAGNOSIS — G459 Transient cerebral ischemic attack, unspecified: Secondary | ICD-10-CM | POA: Diagnosis not present

## 2019-02-18 DIAGNOSIS — R202 Paresthesia of skin: Secondary | ICD-10-CM

## 2019-02-18 MED ORDER — GABAPENTIN 300 MG PO CAPS: 300.0000 mg | ORAL_CAPSULE | Freq: Three times a day (TID) | ORAL | 1 refills | Status: DC

## 2019-02-18 NOTE — Progress Notes (Signed)
Reason for visit: TIA, history of headache, paresthesias  Referring physician: [facility]  [name] is a 78 y.o. female  History of present illness:  Ms. Tilmon is a 78 year old right-handed white female with a history of Arnold-Chiari malformation, status post suboccipital craniectomy.  The patient has been seen previously for headaches, she was placed on gabapentin taking 300 mg 3 times daily, she believes that this has helped her headaches some.  The patient comes back to the office with some new problems.  She was admitted to the hospital on 11 February 2019 with onset of right face and arm numbness and some slurred speech.  The patient underwent an extensive work-up that did not show evidence of an acute stroke.  The patient claims that her symptomatic period was about 15 to 20 minutes in duration.  She underwent MRI of the brain that was unremarkable, a CT angiogram of the head and neck showed some stenosis of the right internal carotid artery, and a prior carotid enterectomy on the left without stenosis.  The patient was kept on her Plavix.  She has done fairly well coming out of the hospital but she also reports a one-month history of intermittent numbness in the hands that is worse in the evening hours, she has achy pain in the arms and shoulders.  She does have some neck pain particularly on the right.  She denies any increase in her arm symptoms with head turning.  She denies any changes in bowel or bladder control.  She denies any changes in balance.  When she is active with her arms, she does not note the numbness.  Past Medical History:  Diagnosis Date  . 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 Arnold-Chiari malformation ([place]) 1998  . Asthma   . 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  . Blood in stool    last week of aug 2018  . Carotid artery occlusion   . Complication of anesthesia   . COPD (chronic obstructive  pulmonary disease) ([place])   . Coronary artery disease   . 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 Diverticulitis   . Dyspnea    with exertion  . Gastroesophageal reflux disease    occ  . Glaucoma    right eye  . Headache    tension  . Headache syndrome 11/27/2018  . Hiatal hernia   . Hyperlipidemia   . Hypertension   . Iliac artery aneurysm ([place])   . Lung cancer ([place]) dx 2018   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  . Myocardial infarction [facility]) 01/01/2000   Cardiac catheterization  . Peripheral vascular disease ([place])    stents in legs x 2 or 3  . Pneumonia    last time winter 2017 -2018  . PONV (postoperative nausea and vomiting)    occassionally, last colonscopy did ok with anesthesia  . Reflux     Past Surgical History:  Procedure Laterality Date  . ABDOMINAL HYSTERECTOMY    . APPENDECTOMY    . Arnold-chiari malformation repair  1998   Suboccipital craniectomy  . CAROTID ENDARTERECTOMY  03/29/2010   Left  CEA  . CHOLECYSTECTOMY     Gall Bladder  . COLONOSCOPY WITH PROPOFOL N/A 04/22/2015   Procedure: COLONOSCOPY WITH PROPOFOL;  Surgeon: Carol Ada, MD;  Location: [facility];  Service: Endoscopy;  Laterality: N/A;  . COLONOSCOPY WITH PROPOFOL N/A 05/25/2016   Procedure: COLONOSCOPY WITH PROPOFOL;  Surgeon: Carol Ada, MD;  Location: [facility];  Service: Endoscopy;  Laterality: N/A;  . COLONOSCOPY WITH PROPOFOL N/A 05/03/2017   Procedure: COLONOSCOPY WITH PROPOFOL;  Surgeon: Carol Ada, MD;  Location: [facility];  Service: Endoscopy;  Laterality: N/A;  . CORNEAL TRANSPLANT     Right  . CORONARY ARTERY BYPASS GRAFT  01/01/2000   x 3  . ENTEROSCOPY N/A 02/27/2018   Procedure: ENTEROSCOPY;  Surgeon: Carol Ada, MD;  Location: [facility];  Service: Endoscopy;  Laterality: N/A;  . ENTEROSCOPY N/A 07/18/2018   Procedure: ENTEROSCOPY;  Surgeon: Carol Ada, MD;  Location: [facility];  Service: Endoscopy;  Laterality: N/A;  . ESOPHAGOGASTRODUODENOSCOPY  N/A 05/25/2016   Procedure: ESOPHAGOGASTRODUODENOSCOPY (EGD);  Surgeon: Carol Ada, MD;  Location: [facility];  Service: Endoscopy;  Laterality: N/A;  . ESOPHAGOGASTRODUODENOSCOPY N/A 08/01/2018   Procedure: ESOPHAGOGASTRODUODENOSCOPY (EGD);  Surgeon: Carol Ada, MD;  Location: [facility];  Service: Endoscopy;  Laterality: N/A;  . EYE SURGERY Right 1995 or 1996   Laser surgery for retinal hemorrhage  . GIVENS CAPSULE STUDY N/A 07/16/2018   Procedure: GIVENS CAPSULE STUDY;  Surgeon: Carol Ada, MD;  Location: [facility];  Service: Endoscopy;  Laterality: N/A;  . HOT HEMOSTASIS N/A 02/27/2018   Procedure: HOT HEMOSTASIS (ARGON PLASMA COAGULATION/BICAP);  Surgeon: Carol Ada, MD;  Location: [facility];  Service: Endoscopy;  Laterality: N/A;  . HOT HEMOSTASIS N/A 08/01/2018   Procedure: HOT HEMOSTASIS (ARGON PLASMA COAGULATION/BICAP);  Surgeon: Carol Ada, MD;  Location: [facility];  Service: Endoscopy;  Laterality: N/A;  . IR RADIOLOGIST EVAL & MGMT  12/14/2016  . LEFT HEART CATH AND CORS/GRAFTS ANGIOGRAPHY N/A 01/09/2018   Procedure: LEFT HEART CATH AND CORS/GRAFTS ANGIOGRAPHY;  Surgeon: Charolette Forward, MD;  Location: [facility];  Service: Cardiovascular;  Laterality: N/A;  . LEFT HEART CATHETERIZATION WITH CORONARY ANGIOGRAM N/A 08/03/2014   Procedure: LEFT HEART CATHETERIZATION WITH CORONARY ANGIOGRAM;  Surgeon: Birdie Riddle, MD;  Location: [facility];  Service: Cardiovascular;  Laterality: N/A;  . Post Coronary Artery  BPG  01/05/2000   Right jugular sheath removed  . PR VEIN BYPASS GRAFT,AORTO-FEM-POP    . ROTATOR CUFF REPAIR     Right    Family History  Problem Relation Age of Onset  . Heart disease Mother        Heart Disease before age 38  . Hypertension Mother   . Hyperlipidemia Mother   . Heart attack Mother   . Clotting disorder Mother   . Heart disease Father        Heart Disease before age 58  . Heart attack Father   . Hyperlipidemia Father    . Hypertension Father   . Heart disease Brother        Heart Disease before age 60  . Hyperlipidemia Brother   . Hypertension Brother   . Clotting disorder Brother   . AAA (abdominal aortic aneurysm) Brother   . Cerebral aneurysm Sister   . Hypertension Sister   . AAA (abdominal aortic aneurysm) Sister   . Asthma Sister   . Cerebral aneurysm Brother   . Cancer Brother        Lung  . Hypertension Brother   . Heart attack Brother   . Heart disease Brother        Aneurysm of Brain  . Hypertension Brother   . Heart disease Brother   . Heart disease Brother   . Stroke Son        Aneurysm of  Stomach  . AAA (abdominal aortic aneurysm) Son   . Cancer Maternal Uncle        great uncle/cancer/type unknown    Social history:  reports that she quit smoking about 18 years ago. Her smoking use included cigarettes. She has a 45.00 pack-year smoking history. She has never used smokeless tobacco. She reports that she does not drink alcohol or use drugs.  Medications:  Prior to Admission medications   Medication Sig Start Date End Date Taking? Authorizing Provider  acetaminophen (TYLENOL) 325 MG tablet Take 650 mg by mouth every 6 (six) hours as needed for moderate pain or headache.    Yes [provider]  amLODipine-olmesartan (AZOR) 5-40 MG tablet Take 1 tablet by mouth daily.   Yes [provider]  cetirizine (ZYRTEC) 10 MG tablet Take 10 mg by mouth as needed for allergies.   Yes [provider]  clopidogrel (PLAVIX) 75 MG tablet Take 1 tablet (75 mg total) by mouth daily. 04/02/12  Yes Conrad Mantua, MD  dexlansoprazole (DEXILANT) 60 MG capsule Take 1 capsule (60 mg total) by mouth daily before breakfast. 11/17/12  Yes Tanda Rockers, MD  furosemide (LASIX) 20 MG tablet Take 20 mg by mouth daily. 01/20/19  Yes [provider]  gabapentin (NEURONTIN) 300 MG capsule Take 300 mg by mouth 3 (three) times daily. 01/12/19  Yes [provider]   nitroGLYCERIN (NITROSTAT) 0.4 MG SL tablet Place 0.4 mg under the tongue every 5 (five) minutes as needed for chest pain.    Yes [provider]  rosuvastatin (CRESTOR) 10 MG tablet Take 10 mg by mouth at bedtime.    Yes [provider]  spironolactone (ALDACTONE) 25 MG tablet Take 25 mg by mouth daily. 03/25/17  Yes [provider]  umeclidinium-vilanterol (ANORO ELLIPTA) 62.5-25 MCG/INH AEPB Inhale 2 puffs into the lungs daily. 03/14/17  Yes [provider]  ferrous sulfate 325 (65 FE) MG tablet Take 1 tablet (325 mg total) by mouth daily with breakfast. 07/19/18 02/11/19  Alma Friendly, MD      Allergies  Allergen Reactions  . Azithromycin Swelling    Patient reported past history of lip swelling  . Codeine Other (See Comments)    Dr. Terrence Dupont advised patient not to take this medication  . Doxycycline Swelling    Mouth, lips, feet swelling  . Hydromorphone Palpitations and Other (See Comments)    DILAUDID  -  Pt had a Heart Attack after taking Dilaudid.  . Levaquin [Levofloxacin] Other (See Comments)    Chest pressure, SOB, "pain in between shoulder blades", sweaty -as reported by patient per experience in ED this afternoon  . Avelox [Moxifloxacin Hcl In Nacl] Palpitations    Caused Heart Attack   . Oxycodone-Acetaminophen Other (See Comments)    Says it makes her feel weird  . Risedronate Other (See Comments)    Chest pain    ROS:  Out of a complete 14 system review of symptoms, the patient complains only of the following symptoms, and all other reviewed systems are negative.  Hand numbness, arm pain Headache Neck pain  Blood pressure (!) 150/64, pulse 67, temperature (!) 97.3 F (36.3 C), temperature source Temporal, height 5\' 2"  (1.575 m), weight 170 lb 4 oz (77.2 kg), SpO2 97 %.  Physical Exam  General: The patient is alert and cooperative at the time of the examination.  Eyes: Pupils are equal, round, and reactive to light. Discs  are flat bilaterally.  Neck:  The neck is supple, bilateral carotid bruits are noted, right greater than left.  Respiratory: The respiratory examination is clear.  Cardiovascular: The cardiovascular examination reveals a regular rate and rhythm, no obvious murmurs or rubs are noted.  Neuromuscular: The patient lacks about 20 degrees of full lateral rotation of the cervical spine bilaterally.  Skin: Extremities are without significant edema.  Neurologic Exam  Mental status: The patient is alert and oriented x 3 at the time of the examination. The patient has apparent normal recent and remote memory, with an apparently normal attention span and concentration ability.  Cranial nerves: Facial symmetry is present. There is good sensation of the face to pinprick on the left, decreased on the right. The strength of the facial muscles and the muscles to head turning and shoulder shrug are normal bilaterally. Speech is well enunciated, no aphasia or dysarthria is noted. Extraocular movements are full. Visual fields are full. The tongue is midline, and the patient has symmetric elevation of the soft palate. No obvious hearing deficits are noted.  Motor: The motor testing reveals 5 over 5 strength of all 4 extremities. Good symmetric motor tone is noted throughout.  Sensory: Sensory testing is notable for decreased pinprick sensation testing on the right arm and legs compared to the left.  Vibration sensation is symmetric throughout.  No stocking pattern pinprick sensory deficit is noted in the lower extremities.. No evidence of extinction is noted.  Coordination: Cerebellar testing reveals good finger-nose-finger and heel-to-shin bilaterally.  Tinel sign at the wrists were negative on the right but positive on the left.  Gait and station: Gait is normal. Tandem gait is unsteady. Romberg is negative. No drift is seen.  Reflexes: Deep tendon reflexes are symmetric and normal bilaterally, with exception  of depression of the ankle jerk reflexes bilaterally. Toes are downgoing bilaterally.   MRI brain 02/11/19:  IMPRESSION: No acute intracranial abnormality. Stable unremarkable MRI of the brain for age.  * MRI scan images were reviewed online. I agree with the written report.   CTA head and neck 02/11/19:  IMPRESSION: 1. No emergent large vessel occlusion. 2. Mild intracranial atherosclerosis without flow limiting proximal stenosis. 3. 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 4. Predominantly right-sided cervical carotid artery atherosclerosis without significant stenosis. Previous left carotid endarterectomy. 5. 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 6.  Aortic Atherosclerosis (ICD10-I70.0).   2D echo 02/12/19:  IMPRESSIONS    1. The left ventricle has normal systolic function with an ejection fraction of 60-65%. The cavity size was normal. Left ventricular diastolic parameters were normal.  2. The right ventricle has normal systolic function. The cavity was normal.  3. Left atrial size was mildly dilated.  4. The mitral valve is abnormal. Mild thickening of the mitral valve leaflet.  5. The tricuspid valve is grossly normal.  6. The aortic valve is tricuspid. Mild calcification of the aortic valve. Aortic valve regurgitation is trivial by color flow Doppler. No stenosis of the aortic valve.  7. Normal LV function; sclerotic aortic valve with trace AI; mild LAE.    Assessment/Plan:  1.  History of neck pain and occipital headache  2.  Recent TIA event  3.  Intermittent numbness and discomfort of the upper extremities  The patient has had a recent cerebrovascular work-up that was relatively unremarkable.  The patient has been kept on Plavix for now.  She reports some improvement in her headaches with gabapentin, a prescription for  the 300 mg capsules will be sent in.  The patient reports a one-month history  of discomfort and numbness in the arms and hands, she may have carpal tunnel syndrome.  Nerve conduction studies will be performed on both arms and one leg, EMG on one arm.  She will follow-up with above study.  She will be seen for routine evaluation in about 4 months.  The patient underwent a complete myelogram in October 2019, no evidence of spinal stenosis or nerve root impingement was noted on that evaluation.  Jill Alexanders MD 02/18/2019 8:50 AM  [facility] [address] [place], [place] [address]  Phone [phone] Fax [phone]

## 2019-02-19 ENCOUNTER — Other Ambulatory Visit: Payer: Self-pay

## 2019-02-19 ENCOUNTER — Encounter: Payer: Self-pay | Admitting: Pulmonary Disease

## 2019-02-19 ENCOUNTER — Ambulatory Visit (INDEPENDENT_AMBULATORY_CARE_PROVIDER_SITE_OTHER): Payer: Medicare Other | Admitting: Pulmonary Disease

## 2019-02-19 VITALS — BP 124/70 | HR 63 | Ht 62.0 in | Wt 175.2 lb

## 2019-02-19 DIAGNOSIS — J449 Chronic obstructive pulmonary disease, unspecified: Secondary | ICD-10-CM

## 2019-02-19 DIAGNOSIS — Z923 Personal history of irradiation: Secondary | ICD-10-CM | POA: Diagnosis not present

## 2019-02-19 DIAGNOSIS — Z8673 Personal history of transient ischemic attack (TIA), and cerebral infarction without residual deficits: Secondary | ICD-10-CM

## 2019-02-19 DIAGNOSIS — C3431 Malignant neoplasm of lower lobe, right bronchus or lung: Secondary | ICD-10-CM

## 2019-02-19 DIAGNOSIS — Z951 Presence of aortocoronary bypass graft: Secondary | ICD-10-CM

## 2019-02-19 DIAGNOSIS — R0602 Shortness of breath: Secondary | ICD-10-CM | POA: Diagnosis not present

## 2019-02-19 MED ORDER — TRELEGY ELLIPTA 100-62.5-25 MCG/INH IN AEPB: 1.0000 | INHALATION_SPRAY | Freq: Every day | RESPIRATORY_TRACT | 0 refills | Status: DC

## 2019-02-19 MED ORDER — TRELEGY ELLIPTA 100-62.5-25 MCG/INH IN AEPB: 1.0000 | INHALATION_SPRAY | Freq: Every day | RESPIRATORY_TRACT | 6 refills | Status: DC

## 2019-02-19 NOTE — Patient Instructions (Addendum)
Thank you for visiting Dr. Valeta Harms at [facility]. Today we recommend the following:  Please stop your Anoro and start Trelegy inhaler.  Dr. Julien Nordmann will let me know if there is anything we need to address on your CT image.   Meds ordered this encounter  Medications  . Fluticasone-Umeclidin-Vilant (TRELEGY ELLIPTA) 100-62.5-25 MCG/INH AEPB    Sig: Inhale 1 puff into the lungs daily.    Dispense:  60 each    Refill:  6   Return in about 6 months (around 08/22/2019), or if symptoms worsen or fail to improve.

## 2019-02-19 NOTE — Progress Notes (Signed)
Synopsis: Referred in January 2020 to establish care, former patient of Dr. Lenna Gilford   Subjective:   PATIENT ID: [name] GENDER: female DOB: 1941/05/06, MRN: [id number]  Chief Complaint  Patient presents with   Follow-up    F/U re: COPD. She reports her SOB is worse. She reports using her Anoro daily. She reports she feels like she is getting SOB more frequently and reports her oxygen dropped during a walk while at ED.     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  She is followed with Dr. Earlie Server and Dr. Tammi Klippel.  In the oncology services.  She has an appointment with him tomorrow.  She had a recent CT scan completed yesterday which reveals right lower lobe groundglass opacities and scarring after stereotactic radiation to the right lower lobe.  She also has a small 7 mm central nodular location that will need to be followed.  She had PFTs completed today prior to her office visit.  The pulmonary function test revealed moderate COPD with an FEV1 65% postbronchodilator reduced ratio of 66, significant bronchodilator response with greater than 12% improvement, as well as a reduced DLCO 55% predicted.  Patient states today that she quit using her Anora inhaler several months ago because she did not know if it was making much difference in her breathing symptoms.  Before that she liked being on it.  There was some confusion regarding stopping blood pressure medication Azor versus stopping her Anoro.  She may have misunderstood that that information regarding changing of medications.  However she does have a stock supply of an oral at home that she could restart.  She did think it made a little bit of difference but was not sure.  At baseline she is dyspneic on exertion but she does not have any significant cough or daily sputum production.  OV 02/19/2019: recently in the hospital for TIA  symptoms. No stroke. Now on plavix, taken off ASA. Followed planned with Dr. Julien Nordmann from oncology on 15th. Doing well since hospitalization. Concerned about her o2 levels. They were low a few times in there hospital. Quit smoking in 2001.     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   Asthma    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   Blood in stool    last week of aug 2018   Carotid artery occlusion    Complication of anesthesia    COPD (chronic obstructive pulmonary disease) (HCC)    Coronary artery disease    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   Diverticulitis    Dyspnea    with exertion   Gastroesophageal reflux disease    occ   Glaucoma    right eye   Headache    tension   Headache syndrome 11/27/2018   Hiatal hernia    Hyperlipidemia    Hypertension    Iliac artery aneurysm (HCC)    Lung cancer ([place]) dx 2018   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   Myocardial infarction ([place]) 01/01/2000   Cardiac catheterization   Peripheral vascular disease ([place])    stents in legs x 2 or 3   Pneumonia    last time winter 2017 -2018   PONV (postoperative nausea and vomiting)  occassionally, last colonscopy did ok with anesthesia   Reflux      Family History  Problem Relation Age of Onset   Heart disease Mother        Heart Disease before age 102   Hypertension Mother    Hyperlipidemia Mother    Heart attack Mother    Clotting disorder Mother    Heart disease Father        Heart Disease before age 28   Heart attack Father    Hyperlipidemia Father    Hypertension Father    Heart disease Brother        Heart Disease before age 50   Hyperlipidemia Brother    Hypertension Brother    Clotting disorder Brother    AAA (abdominal aortic aneurysm) Brother    Cerebral aneurysm Sister    Hypertension  Sister    AAA (abdominal aortic aneurysm) Sister    Asthma Sister    Cerebral aneurysm Brother    Cancer Brother        Lung   Hypertension Brother    Heart attack Brother    Heart disease Brother        Aneurysm of Brain   Hypertension Brother    Heart disease Brother    Heart disease Brother    Stroke Son        Aneurysm of Stomach   AAA (abdominal aortic aneurysm) Son    Cancer Maternal Uncle        great uncle/cancer/type unknown     Past Surgical History:  Procedure Laterality Date   ABDOMINAL HYSTERECTOMY     APPENDECTOMY     Arnold-chiari malformation repair  1998   Suboccipital craniectomy   CAROTID ENDARTERECTOMY  03/29/2010   Left  CEA   CHOLECYSTECTOMY     Gall Bladder   COLONOSCOPY WITH PROPOFOL N/A 04/22/2015   Procedure: COLONOSCOPY WITH PROPOFOL;  Surgeon: Carol Ada, MD;  Location: [facility];  Service: Endoscopy;  Laterality: N/A;   COLONOSCOPY WITH PROPOFOL N/A 05/25/2016   Procedure: COLONOSCOPY WITH PROPOFOL;  Surgeon: Carol Ada, MD;  Location: [facility];  Service: Endoscopy;  Laterality: N/A;   COLONOSCOPY WITH PROPOFOL N/A 05/03/2017   Procedure: COLONOSCOPY WITH PROPOFOL;  Surgeon: Carol Ada, MD;  Location: [facility];  Service: Endoscopy;  Laterality: N/A;   CORNEAL TRANSPLANT     Right   CORONARY ARTERY BYPASS GRAFT  01/01/2000   x 3   ENTEROSCOPY N/A 02/27/2018   Procedure: ENTEROSCOPY;  Surgeon: Carol Ada, MD;  Location: [facility];  Service: Endoscopy;  Laterality: N/A;   ENTEROSCOPY N/A 07/18/2018   Procedure: ENTEROSCOPY;  Surgeon: Carol Ada, MD;  Location: [facility];  Service: Endoscopy;  Laterality: N/A;   ESOPHAGOGASTRODUODENOSCOPY N/A 05/25/2016   Procedure: ESOPHAGOGASTRODUODENOSCOPY (EGD);  Surgeon: Carol Ada, MD;  Location: [facility];  Service: Endoscopy;  Laterality: N/A;   ESOPHAGOGASTRODUODENOSCOPY N/A 08/01/2018   Procedure: ESOPHAGOGASTRODUODENOSCOPY (EGD);  Surgeon: Carol Ada, MD;  Location: [facility];  Service: Endoscopy;  Laterality: N/A;   EYE SURGERY Right 1995 or 1996   Laser surgery for retinal hemorrhage   GIVENS CAPSULE STUDY N/A 07/16/2018   Procedure: GIVENS CAPSULE STUDY;  Surgeon: Carol Ada, MD;  Location: [facility];  Service: Endoscopy;  Laterality: N/A;   HOT HEMOSTASIS N/A 02/27/2018   Procedure: HOT HEMOSTASIS (ARGON PLASMA COAGULATION/BICAP);  Surgeon: Carol Ada, MD;  Location: [facility];  Service: Endoscopy;  Laterality: N/A;   HOT HEMOSTASIS N/A 08/01/2018   Procedure: HOT HEMOSTASIS (ARGON  PLASMA COAGULATION/BICAP);  Surgeon: Carol Ada, MD;  Location: [facility];  Service: Endoscopy;  Laterality: N/A;   IR RADIOLOGIST EVAL & MGMT  12/14/2016   LEFT HEART CATH AND CORS/GRAFTS ANGIOGRAPHY N/A 01/09/2018   Procedure: LEFT HEART CATH AND CORS/GRAFTS ANGIOGRAPHY;  Surgeon: Charolette Forward, MD;  Location: [facility];  Service: Cardiovascular;  Laterality: N/A;   LEFT HEART CATHETERIZATION WITH CORONARY ANGIOGRAM N/A 08/03/2014   Procedure: LEFT HEART CATHETERIZATION WITH CORONARY ANGIOGRAM;  Surgeon: Birdie Riddle, MD;  Location: [facility];  Service: Cardiovascular;  Laterality: N/A;   Post Coronary Artery  BPG  01/05/2000   Right jugular sheath removed   PR VEIN BYPASS GRAFT,AORTO-FEM-POP     ROTATOR CUFF REPAIR     Right    Social History   Socioeconomic History   Marital status: Widowed    Spouse name: Not on file   Number of children: 4   Years of education: 7TH   Highest education level: Not on file  Occupational History   Not on file  Social Needs   Financial resource strain: Not on file   Food insecurity    Worry: Not on file    Inability: Not on file   Transportation needs    Medical: Not on file    Non-medical: Not on file  Tobacco Use   Smoking status: Former Smoker    Packs/day: 1.50    Years: 30.00    Pack years: 45.00    Types: Cigarettes    Quit date: 08/13/2000     Years since quitting: 18.5   Smokeless tobacco: Never Used  Substance and Sexual Activity   Alcohol use: No    Alcohol/week: 0.0 standard drinks   Drug use: No   Sexual activity: Never  Lifestyle   Physical activity    Days per week: Not on file    Minutes per session: Not on file   Stress: Not on file  Relationships   Social connections    Talks on phone: Not on file    Gets together: Not on file    Attends religious service: Not on file    Active member of club or organization: Not on file    Attends meetings of clubs or organizations: Not on file    Relationship status: Not on file   Intimate partner violence    Fear of current or ex partner: Not on file    Emotionally abused: Not on file    Physically abused: Not on file    Forced sexual activity: Not on file  Other Topics Concern   Not on file  Social History Narrative   Lives at home w/ her son   Right-handed   Drinks coffee and [employer] daily     Allergies  Allergen Reactions   Azithromycin Swelling    Patient reported past history of lip swelling   Codeine Other (See Comments)    Dr. Terrence Dupont advised patient not to take this medication   Doxycycline Swelling    Mouth, lips, feet swelling   Hydromorphone Palpitations and Other (See Comments)    DILAUDID  -  Pt had a Heart Attack after taking Dilaudid.   Levaquin [Levofloxacin] Other (See Comments)    Chest pressure, SOB, "pain in between shoulder blades", sweaty -as reported by patient per experience in ED this afternoon   Avelox [Moxifloxacin Hcl In Nacl] Palpitations    Caused Heart Attack    Oxycodone-Acetaminophen Other (See Comments)    Says  it makes her feel weird   Risedronate Other (See Comments)    Chest pain     Outpatient Medications Prior to Visit  Medication Sig Dispense Refill   acetaminophen (TYLENOL) 325 MG tablet Take 650 mg by mouth every 6 (six) hours as needed for moderate pain or headache.      amLODipine-olmesartan  (AZOR) 5-40 MG tablet Take 1 tablet by mouth daily.     cetirizine (ZYRTEC) 10 MG tablet Take 10 mg by mouth as needed for allergies.     clopidogrel (PLAVIX) 75 MG tablet Take 1 tablet (75 mg total) by mouth daily. 90 tablet 3   dexlansoprazole (DEXILANT) 60 MG capsule Take 1 capsule (60 mg total) by mouth daily before breakfast. 30 capsule 11   furosemide (LASIX) 20 MG tablet Take 20 mg by mouth daily.     gabapentin (NEURONTIN) 300 MG capsule Take 1 capsule (300 mg total) by mouth 3 (three) times daily. 270 capsule 1   nitroGLYCERIN (NITROSTAT) 0.4 MG SL tablet Place 0.4 mg under the tongue every 5 (five) minutes as needed for chest pain.      rosuvastatin (CRESTOR) 10 MG tablet Take 10 mg by mouth at bedtime.      spironolactone (ALDACTONE) 25 MG tablet Take 25 mg by mouth daily.     umeclidinium-vilanterol (ANORO ELLIPTA) 62.5-25 MCG/INH AEPB Inhale 2 puffs into the lungs daily.     ferrous sulfate 325 (65 FE) MG tablet Take 1 tablet (325 mg total) by mouth daily with breakfast. 30 tablet 0   Facility-Administered Medications Prior to Visit  Medication Dose Route Frequency Provider Last Rate Last Dose   promethazine (PHENERGAN) injection 6.25-12.5 mg  6.25-12.5 mg Intravenous Q15 min PRN Brennan Bailey, MD        Review of Systems  Constitutional: Negative for chills, fever, malaise/fatigue and weight loss.  HENT: Negative for hearing loss, sore throat and tinnitus.   Eyes: Negative for blurred vision and double vision.  Respiratory: Positive for cough and shortness of breath. Negative for hemoptysis, sputum production, wheezing and stridor.   Cardiovascular: Negative for chest pain, palpitations, orthopnea, leg swelling and PND.  Gastrointestinal: Negative for abdominal pain, constipation, diarrhea, heartburn, nausea and vomiting.  Genitourinary: Negative for dysuria, hematuria and urgency.  Musculoskeletal: Positive for myalgias. Negative for joint pain.  Skin: Negative  for itching and rash.  Neurological: Negative for dizziness, tingling, weakness and headaches.  Endo/Heme/Allergies: Negative for environmental allergies. Does not bruise/bleed easily.  Psychiatric/Behavioral: Negative for depression. The patient is not nervous/anxious and does not have insomnia.   All other systems reviewed and are negative.    Objective:  Physical Exam Vitals signs reviewed.  Constitutional:      General: She is not in acute distress.    Appearance: She is well-developed.  HENT:     Head: Normocephalic and atraumatic.  Eyes:     General: No scleral icterus.    Conjunctiva/sclera: Conjunctivae normal.     Pupils: Pupils are equal, round, and reactive to light.  Neck:     Musculoskeletal: Neck supple.     Vascular: No JVD.     Trachea: No tracheal deviation.  Cardiovascular:     Rate and Rhythm: Normal rate and regular rhythm.     Heart sounds: Normal heart sounds. No murmur.  Pulmonary:     Effort: Pulmonary effort is normal. No tachypnea, accessory muscle usage or respiratory distress.     Breath sounds: Normal breath sounds. No stridor. No  wheezing, rhonchi or rales.  Abdominal:     General: Bowel sounds are normal. There is no distension.     Palpations: Abdomen is soft.     Tenderness: There is no abdominal tenderness.  Musculoskeletal:        General: No tenderness.  Lymphadenopathy:     Cervical: No cervical adenopathy.  Skin:    General: Skin is warm and dry.     Capillary Refill: Capillary refill takes less than 2 seconds.     Findings: No rash.  Neurological:     Mental Status: She is alert and oriented to person, place, and time.  Psychiatric:        Behavior: Behavior normal.      Vitals:   02/19/19 1359  BP: 124/70  Pulse: 63  SpO2: 98%  Weight: 175 lb 3.2 oz (79.5 kg)  Height: 5\' 2"  (1.575 m)   98% on RA BMI Readings from Last 3 Encounters:  02/19/19 32.04 kg/m  02/18/19 31.14 kg/m  02/15/19 32.56 kg/m   Wt Readings  from Last 3 Encounters:  02/19/19 175 lb 3.2 oz (79.5 kg)  02/18/19 170 lb 4 oz (77.2 kg)  02/15/19 178 lb (80.7 kg)     CBC    Component Value Date/Time   WBC 6.7 02/12/2019 0403   RBC 3.09 (L) 02/12/2019 0403   HGB 9.4 (L) 02/12/2019 0403   HGB 10.4 (L) 08/25/2018 0805   HGB 9.9 (L) 08/02/2017 0821   HCT 29.3 (L) 02/12/2019 0403   HCT 29.8 (L) 08/02/2017 0821   PLT 233 02/12/2019 0403   PLT 276 08/25/2018 0805   PLT 218 08/02/2017 0821   MCV 94.8 02/12/2019 0403   MCV 92.3 08/02/2017 0821   MCH 30.4 02/12/2019 0403   MCHC 32.1 02/12/2019 0403   RDW 12.3 02/12/2019 0403   RDW 14.4 08/02/2017 0821   LYMPHSABS 2.2 02/11/2019 1354   LYMPHSABS 2.6 08/02/2017 0821   MONOABS 0.7 02/11/2019 1354   MONOABS 0.5 08/02/2017 0821   EOSABS 0.3 02/11/2019 1354   EOSABS 0.2 08/02/2017 0821   BASOSABS 0.0 02/11/2019 1354   BASOSABS 0.0 08/02/2017 0821    Chest Imaging:  08/25/2018 CT Chest:  Imaging reviewed.  Right lower lobe areas of groundglass and scarring status post sterotactic radiation likely radiation-induced parenchymal scarring.  Small nodular density within this area 7 mm in size.  She also has a new associated/adjacent rib fracture within that right side which may be concerning and would need to be followed for any recurrence of squamous cell disease. The patient's images have been independently reviewed by me.    02/23/2019 CT Chest: Follow up scheduled for cancer eval and staging    Pulmonary Functions Testing Results: PFT Results Latest Ref Rng & Units 08/26/2018 02/14/2018 04/10/2016  FVC-Pre L 1.69 1.80 1.53  FVC-Predicted Pre % 65 69 57  FVC-Post L 1.91 1.99 1.82  FVC-Predicted Post % 74 77 69  Pre FEV1/FVC % % 67 72 71  Post FEV1/FCV % % 66 72 70  FEV1-Pre L 1.12 1.30 1.09  FEV1-Predicted Pre % 58 67 54  FEV1-Post L 1.26 1.43 1.28  DLCO UNC% % 55 - 51  DLCO COR %Predicted % 97 - 86  TLC L 3.85 - 3.77  TLC % Predicted % 81 - 79  RV % Predicted % 94 - 96       FeNO: None   Pathology:   Squamous cell carcinoma of the lung right lower lobe status post  stereotactic radiation therapy  Echocardiogram: none recent   Heart Catheterization:   Jan 09, 2018: Dr. Nelta Numbers LAD to Prox LAD lesion is 80% stenosed.  Ost 1st Diag lesion is 100% stenosed.  Prox RCA lesion is 20% stenosed.  Mid RCA lesion is 30% stenosed.  Mid LM to Dist LM lesion is 40% stenosed.  Ost Ramus lesion is 40% stenosed.  Ost Cx lesion is 40% stenosed.  Origin lesion is 100% stenosed.  Origin to Prox Graft lesion is 100% stenosed.  The left ventricular systolic function is normal.  LV end diastolic pressure is normal.  The left ventricular ejection fraction is 50-55% by visual estimate.    Assessment & Plan:     ICD-10-CM   1. Stage 2 moderate COPD by GOLD classification ([place])  J44.9   2. Primary cancer of right lower lobe of lung (HCC)  C34.31   3. Hx of radiation therapy  Z92.3   4. SOB (shortness of breath)  R06.02   5. S/P CABG x 3  Z95.1   6. History of transient ischemic attack (TIA)  Z86.73     Discussion:  Is a 78 year old female past medical history of stage II COPD, stage Ia squamous cell carcinoma of the lung status post radiation.  Follow-up imaging is scheduled for February 23, 2019.  She also has follow-up with oncology a few days later.  If there is anything on the CT scan that we need to address oncology will reach out to [place].  She was recently admitted to the hospital with a TIA-like symptoms.  She was started on Plavix her aspirin was discontinued.  At this time her COPD is only giving her some symptoms with significant exertion.  We will up her from Anoro Ellipta to triple therapy to include Trelegy inhaler.  We will give her samples of this today and a new prescription sent to her pharmacy.  Patient to follow-up with [place] in 6 months or if symptoms worsen.  Greater than 50% of this patient's 15-minute of visit was been face-to-face  discussing the above recommendation and treatment plans.     Current Outpatient Medications:    acetaminophen (TYLENOL) 325 MG tablet, Take 650 mg by mouth every 6 (six) hours as needed for moderate pain or headache. , Disp: , Rfl:    amLODipine-olmesartan (AZOR) 5-40 MG tablet, Take 1 tablet by mouth daily., Disp: , Rfl:    cetirizine (ZYRTEC) 10 MG tablet, Take 10 mg by mouth as needed for allergies., Disp: , Rfl:    clopidogrel (PLAVIX) 75 MG tablet, Take 1 tablet (75 mg total) by mouth daily., Disp: 90 tablet, Rfl: 3   dexlansoprazole (DEXILANT) 60 MG capsule, Take 1 capsule (60 mg total) by mouth daily before breakfast., Disp: 30 capsule, Rfl: 11   furosemide (LASIX) 20 MG tablet, Take 20 mg by mouth daily., Disp: , Rfl:    gabapentin (NEURONTIN) 300 MG capsule, Take 1 capsule (300 mg total) by mouth 3 (three) times daily., Disp: 270 capsule, Rfl: 1   nitroGLYCERIN (NITROSTAT) 0.4 MG SL tablet, Place 0.4 mg under the tongue every 5 (five) minutes as needed for chest pain. , Disp: , Rfl:    rosuvastatin (CRESTOR) 10 MG tablet, Take 10 mg by mouth at bedtime. , Disp: , Rfl:    spironolactone (ALDACTONE) 25 MG tablet, Take 25 mg by mouth daily., Disp: , Rfl:    umeclidinium-vilanterol (ANORO ELLIPTA) 62.5-25 MCG/INH AEPB, Inhale 2 puffs into the lungs daily., Disp: ,  Rfl:    ferrous sulfate 325 (65 FE) MG tablet, Take 1 tablet (325 mg total) by mouth daily with breakfast., Disp: 30 tablet, Rfl: 0 No current facility-administered medications for this visit.   Facility-Administered Medications Ordered in Other Visits:    promethazine (PHENERGAN) injection 6.25-12.5 mg, 6.25-12.5 mg, Intravenous, Q15 min PRN, Daiva Huge, Leanord Hawking, MD   Garner Nash, DO [facility] 02/19/2019 2:03 PM

## 2019-02-23 ENCOUNTER — Ambulatory Visit (HOSPITAL_COMMUNITY)
Admission: RE | Admit: 2019-02-23 | Discharge: 2019-02-23 | Disposition: A | Discharge status: Home / Self Care | Payer: Medicare Other | Source: Ambulatory Visit | Attending: Internal Medicine | Admitting: Internal Medicine

## 2019-02-23 ENCOUNTER — Other Ambulatory Visit: Payer: Self-pay

## 2019-02-23 ENCOUNTER — Inpatient Hospital Stay: Payer: Medicare Other | Attending: Internal Medicine

## 2019-02-23 DIAGNOSIS — I739 Peripheral vascular disease, unspecified: Secondary | ICD-10-CM | POA: Insufficient documentation

## 2019-02-23 DIAGNOSIS — E785 Hyperlipidemia, unspecified: Secondary | ICD-10-CM | POA: Insufficient documentation

## 2019-02-23 DIAGNOSIS — I2510 Atherosclerotic heart disease of native coronary artery without angina pectoris: Secondary | ICD-10-CM | POA: Diagnosis not present

## 2019-02-23 DIAGNOSIS — R110 Nausea: Secondary | ICD-10-CM | POA: Insufficient documentation

## 2019-02-23 DIAGNOSIS — C3490 Malignant neoplasm of unspecified part of unspecified bronchus or lung: Secondary | ICD-10-CM | POA: Insufficient documentation

## 2019-02-23 DIAGNOSIS — D509 Iron deficiency anemia, unspecified: Secondary | ICD-10-CM | POA: Diagnosis not present

## 2019-02-23 DIAGNOSIS — K219 Gastro-esophageal reflux disease without esophagitis: Secondary | ICD-10-CM | POA: Diagnosis not present

## 2019-02-23 DIAGNOSIS — I10 Essential (primary) hypertension: Secondary | ICD-10-CM | POA: Insufficient documentation

## 2019-02-23 DIAGNOSIS — C3431 Malignant neoplasm of lower lobe, right bronchus or lung: Secondary | ICD-10-CM | POA: Insufficient documentation

## 2019-02-23 DIAGNOSIS — J449 Chronic obstructive pulmonary disease, unspecified: Secondary | ICD-10-CM | POA: Diagnosis not present

## 2019-02-23 DIAGNOSIS — H409 Unspecified glaucoma: Secondary | ICD-10-CM | POA: Insufficient documentation

## 2019-02-23 DIAGNOSIS — I252 Old myocardial infarction: Secondary | ICD-10-CM | POA: Insufficient documentation

## 2019-02-23 DIAGNOSIS — Z79899 Other long term (current) drug therapy: Secondary | ICD-10-CM | POA: Insufficient documentation

## 2019-02-23 LAB — CBC WITH DIFFERENTIAL (CANCER CENTER ONLY)
Abs Immature Granulocytes: 0.02 10*3/uL (ref 0.00–0.07)
Basophils Absolute: 0 10*3/uL (ref 0.0–0.1)
Basophils Relative: 0 %
Eosinophils Absolute: 0.3 10*3/uL (ref 0.0–0.5)
Eosinophils Relative: 3 %
HCT: 33.7 % — ABNORMAL LOW (ref 36.0–46.0)
Hemoglobin: 11 g/dL — ABNORMAL LOW (ref 12.0–15.0)
Immature Granulocytes: 0 %
Lymphocytes Relative: 27 %
Lymphs Abs: 2.3 10*3/uL (ref 0.7–4.0)
MCH: 30.7 pg (ref 26.0–34.0)
MCHC: 32.6 g/dL (ref 30.0–36.0)
MCV: 94.1 fL (ref 80.0–100.0)
Monocytes Absolute: 0.6 10*3/uL (ref 0.1–1.0)
Monocytes Relative: 7 %
Neutro Abs: 5.2 10*3/uL (ref 1.7–7.7)
Neutrophils Relative %: 63 %
Platelet Count: 314 10*3/uL (ref 150–400)
RBC: 3.58 MIL/uL — ABNORMAL LOW (ref 3.87–5.11)
RDW: 12.7 % (ref 11.5–15.5)
WBC Count: 8.4 10*3/uL (ref 4.0–10.5)
nRBC: 0 % (ref 0.0–0.2)

## 2019-02-23 LAB — CMP (CANCER CENTER ONLY)
ALT: 13 U/L (ref 0–44)
AST: 19 U/L (ref 15–41)
Albumin: 3.9 g/dL (ref 3.5–5.0)
Alkaline Phosphatase: 77 U/L (ref 38–126)
Anion gap: 12 (ref 5–15)
BUN: 22 mg/dL (ref 8–23)
CO2: 25 mmol/L (ref 22–32)
Calcium: 9.9 mg/dL (ref 8.9–10.3)
Chloride: 106 mmol/L (ref 98–111)
Creatinine: 1.5 mg/dL — ABNORMAL HIGH (ref 0.44–1.00)
GFR, Est AFR Am: 39 mL/min — ABNORMAL LOW (ref >60)
GFR, Estimated: 33 mL/min — ABNORMAL LOW (ref >60)
Glucose, Bld: 128 mg/dL — ABNORMAL HIGH (ref 70–99)
Potassium: 4.4 mmol/L (ref 3.5–5.1)
Sodium: 143 mmol/L (ref 135–145)
Total Bilirubin: 0.3 mg/dL (ref 0.3–1.2)
Total Protein: 8.2 g/dL — ABNORMAL HIGH (ref 6.5–8.1)

## 2019-02-23 MED ORDER — IOHEXOL 300 MG/ML  SOLN
60.0000 mL | Freq: Once | INTRAMUSCULAR | Status: AC | PRN
  Administered 2019-02-23: 60 mL via INTRAVENOUS

## 2019-02-23 MED ORDER — SODIUM CHLORIDE (PF) 0.9 % IJ SOLN
INTRAMUSCULAR | Status: AC
  Filled 2019-02-23: qty 50

## 2019-02-25 ENCOUNTER — Other Ambulatory Visit: Payer: Self-pay

## 2019-02-25 ENCOUNTER — Encounter: Payer: Self-pay | Admitting: Internal Medicine

## 2019-02-25 ENCOUNTER — Inpatient Hospital Stay (HOSPITAL_BASED_OUTPATIENT_CLINIC_OR_DEPARTMENT_OTHER): Payer: Medicare Other | Admitting: Internal Medicine

## 2019-02-25 VITALS — BP 137/49 | HR 66 | Temp 98.7°F | Resp 18 | Ht 62.0 in | Wt 166.6 lb

## 2019-02-25 DIAGNOSIS — E785 Hyperlipidemia, unspecified: Secondary | ICD-10-CM

## 2019-02-25 DIAGNOSIS — J449 Chronic obstructive pulmonary disease, unspecified: Secondary | ICD-10-CM | POA: Diagnosis not present

## 2019-02-25 DIAGNOSIS — K219 Gastro-esophageal reflux disease without esophagitis: Secondary | ICD-10-CM

## 2019-02-25 DIAGNOSIS — C3431 Malignant neoplasm of lower lobe, right bronchus or lung: Secondary | ICD-10-CM | POA: Diagnosis not present

## 2019-02-25 DIAGNOSIS — Z79899 Other long term (current) drug therapy: Secondary | ICD-10-CM

## 2019-02-25 DIAGNOSIS — I10 Essential (primary) hypertension: Secondary | ICD-10-CM

## 2019-02-25 DIAGNOSIS — I2510 Atherosclerotic heart disease of native coronary artery without angina pectoris: Secondary | ICD-10-CM

## 2019-02-25 DIAGNOSIS — D509 Iron deficiency anemia, unspecified: Secondary | ICD-10-CM

## 2019-02-25 DIAGNOSIS — H409 Unspecified glaucoma: Secondary | ICD-10-CM

## 2019-02-25 DIAGNOSIS — I739 Peripheral vascular disease, unspecified: Secondary | ICD-10-CM

## 2019-02-25 DIAGNOSIS — C3490 Malignant neoplasm of unspecified part of unspecified bronchus or lung: Secondary | ICD-10-CM

## 2019-02-25 DIAGNOSIS — I252 Old myocardial infarction: Secondary | ICD-10-CM

## 2019-02-25 DIAGNOSIS — R110 Nausea: Secondary | ICD-10-CM

## 2019-02-25 NOTE — Progress Notes (Signed)
[place] [phone]   Fax:([phone]) [phone]  OFFICE PROGRESS NOTE  [name], MD [address] [place] [address]  DIAGNOSIS:  1) stage IA non-small cell lung cancer, squamous cell carcinoma presented with right lower lobe pulmonary nodule. 2) persistent anemia questionable for anemia of chronic disease plus/minus iron deficiency.  PRIOR THERAPY:  1) Feraheme infusion last dose was given 06/20/2016. 2) stereotactic radiotherapy to the right lower lobe lung nodule under the care of Dr. Tammi Klippel.  CURRENT THERAPY: Observation.  INTERVAL HISTORY: [name] 78 y.o. female returns to the clinic today for follow-up visit.  The patient is feeling fine today with no concerning complaints except for mild nausea.  There was a concern about TIA recently and the patient had MRI of the brain that was unremarkable.  She denied having any chest pain, shortness of breath, cough or hemoptysis.  She denied having any fever or chills.  She has no nausea, vomiting, diarrhea or constipation.  She denied having any headache or visual changes.  She is here today for evaluation with repeat CT scan of the chest for restaging of her disease.  MEDICAL HISTORY: Past Medical History:  Diagnosis Date  . 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 Asthma   . 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  . Blood in stool    last week of aug 2018  . Carotid artery occlusion   . Complication of anesthesia   . COPD (chronic obstructive pulmonary disease) ([place])   . Coronary artery disease   . 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 Diverticulitis   . Dyspnea    with exertion  . Gastroesophageal reflux disease    occ  . Glaucoma    right eye  . Headache    tension  . Headache syndrome 11/27/2018  . Hiatal hernia   . Hyperlipidemia   . Hypertension   . 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 Lung cancer ([place]) dx 2018   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  . Myocardial infarction [facility]) 01/01/2000   Cardiac catheterization  . Peripheral vascular disease ([place])    stents in legs x 2 or 3  . Pneumonia    last time winter 2017 -2018  . PONV (postoperative nausea and vomiting)    occassionally, last colonscopy did ok with anesthesia  . Reflux     ALLERGIES:  is allergic to azithromycin; codeine; doxycycline; hydromorphone; levaquin [levofloxacin]; avelox [moxifloxacin hcl in nacl]; oxycodone-acetaminophen; and risedronate.  MEDICATIONS:  Current Outpatient Medications  Medication Sig Dispense Refill  . acetaminophen (TYLENOL) 325 MG tablet Take 650 mg by mouth every 6 (six) hours as needed for moderate pain or headache.     Marland Kitchen amLODipine-olmesartan (AZOR) 5-40 MG tablet Take 1 tablet by mouth daily.    . cetirizine (ZYRTEC) 10 MG tablet Take 10 mg by mouth as needed for allergies.    Marland Kitchen clopidogrel (PLAVIX) 75 MG tablet Take 1 tablet (75 mg total) by mouth daily. 90 tablet 3  . dexlansoprazole (DEXILANT) 60 MG capsule Take 1 capsule (60 mg total) by mouth daily before breakfast. 30 capsule 11  . ferrous sulfate 325 (65 FE) MG tablet Take 1 tablet (325 mg total) by mouth daily with breakfast. 30 tablet 0  . Fluticasone-Umeclidin-Vilant (TRELEGY ELLIPTA) 100-62.5-25 MCG/INH AEPB Inhale 1 puff into the lungs daily. [place]  each 6  . Fluticasone-Umeclidin-Vilant (TRELEGY ELLIPTA) 100-62.5-25 MCG/INH AEPB Inhale 1 puff into the lungs daily. 2 each 0  . furosemide (LASIX) 20 MG tablet Take 20 mg by mouth daily.    Marland Kitchen gabapentin (NEURONTIN) 300 MG capsule Take 1 capsule (300 mg total) by mouth 3 (three) times daily. 270 capsule 1  . nitroGLYCERIN (NITROSTAT) 0.4 MG SL tablet Place 0.4 mg under the tongue every 5 (five) minutes as needed for chest pain.     . rosuvastatin (CRESTOR) 10 MG tablet Take 10 mg by mouth at bedtime.     Marland Kitchen spironolactone (ALDACTONE) 25 MG  tablet Take 25 mg by mouth daily.     No current facility-administered medications for this visit.    Facility-Administered Medications Ordered in Other Visits  Medication Dose Route Frequency Provider Last Rate Last Dose  . promethazine (PHENERGAN) injection 6.25-12.5 mg  6.25-12.5 mg Intravenous Q15 min PRN Brennan Bailey, MD        SURGICAL HISTORY:  Past Surgical History:  Procedure Laterality Date  . ABDOMINAL HYSTERECTOMY    . APPENDECTOMY    . Arnold-chiari malformation repair  1998   Suboccipital craniectomy  . CAROTID ENDARTERECTOMY  03/29/2010   Left  CEA  . CHOLECYSTECTOMY     Gall Bladder  . COLONOSCOPY WITH PROPOFOL N/A 04/22/2015   Procedure: COLONOSCOPY WITH PROPOFOL;  Surgeon: Carol Ada, MD;  Location: [facility];  Service: Endoscopy;  Laterality: N/A;  . COLONOSCOPY WITH PROPOFOL N/A 05/25/2016   Procedure: COLONOSCOPY WITH PROPOFOL;  Surgeon: Carol Ada, MD;  Location: [facility];  Service: Endoscopy;  Laterality: N/A;  . COLONOSCOPY WITH PROPOFOL N/A 05/03/2017   Procedure: COLONOSCOPY WITH PROPOFOL;  Surgeon: Carol Ada, MD;  Location: [facility];  Service: Endoscopy;  Laterality: N/A;  . CORNEAL TRANSPLANT     Right  . CORONARY ARTERY BYPASS GRAFT  01/01/2000   x 3  . ENTEROSCOPY N/A 02/27/2018   Procedure: ENTEROSCOPY;  Surgeon: Carol Ada, MD;  Location: [facility];  Service: Endoscopy;  Laterality: N/A;  . ENTEROSCOPY N/A 07/18/2018   Procedure: ENTEROSCOPY;  Surgeon: Carol Ada, MD;  Location: [facility];  Service: Endoscopy;  Laterality: N/A;  . ESOPHAGOGASTRODUODENOSCOPY N/A 05/25/2016   Procedure: ESOPHAGOGASTRODUODENOSCOPY (EGD);  Surgeon: Carol Ada, MD;  Location: [facility];  Service: Endoscopy;  Laterality: N/A;  . ESOPHAGOGASTRODUODENOSCOPY N/A 08/01/2018   Procedure: ESOPHAGOGASTRODUODENOSCOPY (EGD);  Surgeon: Carol Ada, MD;  Location: [facility];  Service: Endoscopy;  Laterality: N/A;  . EYE SURGERY Right 1995 or  1996   Laser surgery for retinal hemorrhage  . GIVENS CAPSULE STUDY N/A 07/16/2018   Procedure: GIVENS CAPSULE STUDY;  Surgeon: Carol Ada, MD;  Location: [facility];  Service: Endoscopy;  Laterality: N/A;  . HOT HEMOSTASIS N/A 02/27/2018   Procedure: HOT HEMOSTASIS (ARGON PLASMA COAGULATION/BICAP);  Surgeon: Carol Ada, MD;  Location: [facility];  Service: Endoscopy;  Laterality: N/A;  . HOT HEMOSTASIS N/A 08/01/2018   Procedure: HOT HEMOSTASIS (ARGON PLASMA COAGULATION/BICAP);  Surgeon: Carol Ada, MD;  Location: [facility];  Service: Endoscopy;  Laterality: N/A;  . IR RADIOLOGIST EVAL & MGMT  12/14/2016  . LEFT HEART CATH AND CORS/GRAFTS ANGIOGRAPHY N/A 01/09/2018   Procedure: LEFT HEART CATH AND CORS/GRAFTS ANGIOGRAPHY;  Surgeon: Charolette Forward, MD;  Location: [place] CV LAB;  Service: Cardiovascular;  Laterality: N/A;  . LEFT HEART CATHETERIZATION WITH CORONARY ANGIOGRAM N/A 08/03/2014   Procedure: LEFT HEART CATHETERIZATION WITH CORONARY ANGIOGRAM;  Surgeon: Birdie Riddle, MD;  Location: [facility] CATH  LAB;  Service: Cardiovascular;  Laterality: N/A;  . Post Coronary Artery  BPG  01/05/2000   Right jugular sheath removed  . PR VEIN BYPASS GRAFT,AORTO-FEM-POP    . ROTATOR CUFF REPAIR     Right    REVIEW OF SYSTEMS:  A comprehensive review of systems was negative except for: Respiratory: positive for dyspnea on exertion Gastrointestinal: positive for nausea   PHYSICAL EXAMINATION: General appearance: alert, cooperative and no distress Head: Normocephalic, without obvious abnormality, atraumatic Neck: no adenopathy, no JVD, supple, symmetrical, trachea midline and thyroid not enlarged, symmetric, no tenderness/mass/nodules Lymph nodes: Cervical, supraclavicular, and axillary nodes normal. Resp: clear to auscultation bilaterally Back: symmetric, no curvature. ROM normal. No CVA tenderness. Cardio: regular rate and rhythm, S1, S2 normal, no murmur, click, rub or gallop GI: soft,  non-tender; bowel sounds normal; no masses,  no organomegaly Extremities: extremities normal, atraumatic, no cyanosis or edema  ECOG PERFORMANCE STATUS: 1 - Symptomatic but completely ambulatory  Blood pressure (!) 137/49, pulse 66, temperature 98.7 F (37.1 C), temperature source Oral, resp. rate 18, height 5\' 2"  (1.575 m), weight 166 lb 9.6 oz (75.6 kg), SpO2 97 %.  LABORATORY DATA: Lab Results  Component Value Date   WBC 8.4 02/23/2019   HGB 11.0 (L) 02/23/2019   HCT 33.7 (L) 02/23/2019   MCV 94.1 02/23/2019   PLT 314 02/23/2019      Chemistry      Component Value Date/Time   NA 143 02/23/2019 0722   NA 139 08/02/2017 0821   K 4.4 02/23/2019 0722   K 4.3 08/02/2017 0821   CL 106 02/23/2019 0722   CO2 25 02/23/2019 0722   CO2 24 08/02/2017 0821   BUN 22 02/23/2019 0722   BUN 14.4 08/02/2017 0821   CREATININE 1.50 (H) 02/23/2019 0722   CREATININE 1.3 (H) 08/02/2017 0821      Component Value Date/Time   CALCIUM 9.9 02/23/2019 0722   CALCIUM 9.2 08/02/2017 0821   ALKPHOS 77 02/23/2019 0722   ALKPHOS 66 08/02/2017 0821   AST 19 02/23/2019 0722   AST 14 08/02/2017 0821   ALT 13 02/23/2019 0722   ALT 8 08/02/2017 0821   BILITOT 0.3 02/23/2019 0722   BILITOT 0.34 08/02/2017 0821       RADIOGRAPHIC STUDIES: Ct Angio Head W Or Wo Contrast  Result Date: 02/11/2019 CLINICAL DATA:  Right facial numbness.  Right arm weakness. EXAM: CT ANGIOGRAPHY HEAD AND NECK TECHNIQUE: Multidetector CT imaging of the head and neck was performed using the standard protocol during bolus administration of intravenous contrast. Multiplanar CT image reconstructions and MIPs were obtained to evaluate the vascular anatomy. Carotid stenosis measurements (when applicable) are obtained utilizing NASCET criteria, using the distal internal carotid diameter as the denominator. CONTRAST:  51mL OMNIPAQUE IOHEXOL 350 MG/ML SOLN COMPARISON:  Head MRA 12/09/2018.  Head and neck CTA 02/17/2016. FINDINGS: CTA  NECK FINDINGS Aortic arch: Normal variant aortic arch branching pattern with common origin of the brachiocephalic and left common carotid arteries. Mild arch atherosclerosis without significant arch vessel origin stenosis. Right carotid system: Patent with moderate soft and calcified plaque in the carotid bulb resulting in less than 50% stenosis. Left carotid system: Patent without stenosis or dissection status post endarterectomy. Vertebral arteries: Patent with the left vertebral artery being moderately dominant. Moderate to severe right vertebral artery origin stenosis due to calcified plaque, stable to mildly progressed from 2017. Skeleton: Mild cervical disc degeneration. Other neck: No evidence of cervical lymphadenopathy or mass. Upper chest:  Mild biapical pleuroparenchymal scarring. Review of the MIP images confirms the above findings CTA HEAD FINDINGS Anterior circulation: The internal carotid arteries are patent from skull base to carotid termini with unchanged mild right cavernous stenosis compared to 2017. An infundibulum at the right posterior communicating origin is unchanged. A 2 mm superiorly directed outpouching at the right MCA bifurcation is unchanged. ACAs and MCAs are patent without evidence of proximal branch occlusion or significant proximal stenosis. Posterior circulation: The intracranial vertebral arteries are widely patent to the basilar. The basilar artery is widely patent a patent right PICA, bilateral AICAs, and bilateral SCAs are identified. The basilar artery is widely patent. There is a small right posterior communicating artery. Both PCAs are patent with branch vessel irregularity but no significant proximal stenosis. No aneurysm is identified. Venous sinuses: Patent. Anatomic variants: None. Review of the MIP images confirms the above findings IMPRESSION: 1. No emergent large vessel occlusion. 2. Mild intracranial atherosclerosis without flow limiting proximal stenosis. 3.  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 4. Predominantly right-sided cervical carotid artery atherosclerosis without significant stenosis. Previous left carotid endarterectomy. 5. 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 6.  Aortic Atherosclerosis (ICD10-I70.0). These results were communicated to Dr. Cheral Marker at 2:58 p.m.on 02/11/2019 by text page via the [facility] messaging system. Electronically Signed   By: Logan Bores M.D.   On: 02/11/2019 15:46   Ct Angio Neck W Or Wo Contrast  Result Date: 02/11/2019 CLINICAL DATA:  Right facial numbness.  Right arm weakness. EXAM: CT ANGIOGRAPHY HEAD AND NECK TECHNIQUE: Multidetector CT imaging of the head and neck was performed using the standard protocol during bolus administration of intravenous contrast. Multiplanar CT image reconstructions and MIPs were obtained to evaluate the vascular anatomy. Carotid stenosis measurements (when applicable) are obtained utilizing NASCET criteria, using the distal internal carotid diameter as the denominator. CONTRAST:  64mL OMNIPAQUE IOHEXOL 350 MG/ML SOLN COMPARISON:  Head MRA 12/09/2018.  Head and neck CTA 02/17/2016. FINDINGS: CTA NECK FINDINGS Aortic arch: Normal variant aortic arch branching pattern with common origin of the brachiocephalic and left common carotid arteries. Mild arch atherosclerosis without significant arch vessel origin stenosis. Right carotid system: Patent with moderate soft and calcified plaque in the carotid bulb resulting in less than 50% stenosis. Left carotid system: Patent without stenosis or dissection status post endarterectomy. Vertebral arteries: Patent with the left vertebral artery being moderately dominant. Moderate to severe right vertebral artery origin stenosis due to calcified plaque, stable to mildly progressed from 2017. Skeleton: Mild cervical disc degeneration. Other neck: No evidence of cervical lymphadenopathy or mass. Upper  chest: Mild biapical pleuroparenchymal scarring. Review of the MIP images confirms the above findings CTA HEAD FINDINGS Anterior circulation: The internal carotid arteries are patent from skull base to carotid termini with unchanged mild right cavernous stenosis compared to 2017. An infundibulum at the right posterior communicating origin is unchanged. A 2 mm superiorly directed outpouching at the right MCA bifurcation is unchanged. ACAs and MCAs are patent without evidence of proximal branch occlusion or significant proximal stenosis. Posterior circulation: The intracranial vertebral arteries are widely patent to the basilar. The basilar artery is widely patent a patent right PICA, bilateral AICAs, and bilateral SCAs are identified. The basilar artery is widely patent. There is a small right posterior communicating artery. Both PCAs are patent with branch vessel irregularity but no significant proximal stenosis. No aneurysm is identified. Venous sinuses: Patent. Anatomic variants: None. Review of the  MIP images confirms the above findings IMPRESSION: 1. No emergent large vessel occlusion. 2. Mild intracranial atherosclerosis without flow limiting proximal stenosis. 3. 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 4. Predominantly right-sided cervical carotid artery atherosclerosis without significant stenosis. Previous left carotid endarterectomy. 5. 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 6.  Aortic Atherosclerosis (ICD10-I70.0). These results were communicated to Dr. Cheral Marker at 2:58 p.m.on 02/11/2019 by text page via the [facility] messaging system. Electronically Signed   By: Logan Bores M.D.   On: 02/11/2019 15:46   Ct Chest W Contrast  Result Date: 02/23/2019 CLINICAL DATA:  Follow-up lung cancer. Status post XRT. Former smoker. EXAM: CT CHEST WITH CONTRAST TECHNIQUE: Multidetector CT imaging of the chest was performed during intravenous contrast  administration. CONTRAST:  42mL OMNIPAQUE IOHEXOL 300 MG/ML  SOLN COMPARISON:  08/25/2018 FINDINGS: Cardiovascular: Previous median sternotomy and CABG procedure. Normal heart size. No pericardial effusion. Aortic atherosclerosis. Mediastinum/Nodes: Normal appearance of the thyroid gland. The trachea appears patent and is midline. Normal appearance of the esophagus. No mediastinal or hilar adenopathy. Lungs/Pleura: No pleural effusion. Mild emphysema. No airspace consolidation. Calcified granuloma in inferior right upper lobe is again noted, unchanged. Band like area of architectural distortion and ground-glass attenuation within the right lower lobe is again identified compatible with changes secondary to external beam radiation. Previously referenced central nodule measuring 6 mm is stable, image 79/5. 4 mm perifissural nodule along the minor fissure is unchanged, image 58/5. Upper Abdomen: No acute abnormality. Musculoskeletal: Chronic fracture deformity involving the posterolateral right seventh rib, image 67/2. no acute or suspicious osseous findings. IMPRESSION: 1. Redemonstration of presumed radiation change in the right lower lobe with underlying similar nodularity. 2. No findings to suggest metastatic disease or new lung lesion. 3. Aortic Atherosclerosis (ICD10-I70.0) and Emphysema (ICD10-J43.9). Electronically Signed   By: Kerby Moors M.D.   On: 02/23/2019 10:16   [name] Contrast  Result Date: 02/11/2019 CLINICAL DATA:  78 y/o F; decreased vision in the right eye, right-sided tingling, right-side heaviness, headache, dizziness. EXAM: MRI HEAD WITHOUT CONTRAST TECHNIQUE: Multiplanar, multiecho pulse sequences of the brain and surrounding structures were obtained without intravenous contrast. COMPARISON:  02/11/2019 CT head and CTA head.  12/09/2018 MRI head. FINDINGS: Brain: No acute infarction, hemorrhage, hydrocephalus, extra-axial collection or mass lesion. Few punctate nonspecific foci of T2  FLAIR hyperintense signal abnormality in white matter of unlikely significance given age. Mild volume loss of the brain. Few stable scattered punctate foci of susceptibility hypointensity compatible hemosiderin deposition of chronic microhemorrhage in a nonspecific distribution. Vascular: Normal flow voids. Skull and upper cervical spine: Normal marrow signal. Sinuses/Orbits: Negative. Other: Right intra-ocular lens replacement. IMPRESSION: No acute intracranial abnormality. Stable unremarkable MRI of the brain for age. Electronically Signed   By: Kristine Garbe M.D.   On: 02/11/2019 21:30   Dg Chest Port 1 View  Result Date: 02/11/2019 CLINICAL DATA:  Initial evaluation for acute chest pain. EXAM: PORTABLE CHEST 1 VIEW COMPARISON:  Prior CT from 08/25/2018 FINDINGS: Median sternotomy wires underlying CABG markers and surgical clips noted. Transverse heart size within normal limits. Mediastinal silhouette normal. Lungs normally inflated. Mild streaky right basilar opacity, presumably reflecting chronic post radiation changes as previously seen. No other focal airspace disease. No pulmonary edema or pleural effusion. No pneumothorax. Mild irregular biapical pleural thickening noted. No acute osseous abnormality. IMPRESSION: 1. Mild streaky right basilar opacity, most likely reflecting chronic post radiation changes as seen on previous CT.  2. No other active cardiopulmonary disease. Electronically Signed   By: Jeannine Boga M.D.   On: 02/11/2019 15:14   Ct Head Code Stroke Wo Contrast  Result Date: 02/11/2019 CLINICAL DATA:  Code stroke.  Right-sided weakness. EXAM: CT HEAD WITHOUT CONTRAST TECHNIQUE: Contiguous axial images were obtained from the base of the skull through the vertex without intravenous contrast. COMPARISON:  Brain MRI 12/09/2018 FINDINGS: Brain: There is no evidence of acute infarct, intracranial hemorrhage, mass, midline shift, or extra-axial fluid collection. Mild cerebral  atrophy is within normal limits for age. Vascular: Calcified atherosclerosis at the skull base. No hyperdense vessel. Skull: Previous suboccipital craniectomy. No fracture or suspicious osseous lesion. Sinuses/Orbits: Paranasal sinuses and mastoid air cells are clear. Right cataract extraction. Other: None. ASPECTS [facility] Stroke Program Early CT Score) - Ganglionic level infarction (caudate, lentiform nuclei, internal capsule, insula, M1-M3 cortex): 7 - Supraganglionic infarction (M4-M6 cortex): 3 Total score (0-10 with 10 being normal): 10 IMPRESSION: 1. Unremarkable CT appearance of the brain for age. 2. ASPECTS is 10. These results were called by telephone at the time of interpretation on 02/11/2019 at 2:14 pm to Dr. Kerney Elbe, who verbally acknowledged these results. Electronically Signed   By: Logan Bores M.D.   On: 02/11/2019 14:15   Vas [place] Lower Extremity Venous (dvt)  Result Date: 02/16/2019  Lower Venous Study Indications: Pain, and Swelling. Right foot and leg  Risk Factors: Cancer Lung. HTN, HLD, PVD Comparison Study: Right LE 12?21/2015 Negative Performing Technologist: Toma Copier RVS  Examination Guidelines: A complete evaluation includes B-mode imaging, spectral Doppler, color Doppler, and power Doppler as needed of all accessible portions of each vessel. Bilateral testing is considered an integral part of a complete examination. Limited examinations for reoccurring indications may be performed as noted.  +---------+---------------+---------+-----------+----------+-------------------+ RIGHT    CompressibilityPhasicitySpontaneityPropertiesSummary             +---------+---------------+---------+-----------+----------+-------------------+ CFV      Full           Yes      Yes                                      +---------+---------------+---------+-----------+----------+-------------------+ SFJ      Full                                                              +---------+---------------+---------+-----------+----------+-------------------+ FV Prox  Full           Yes      Yes                                      +---------+---------------+---------+-----------+----------+-------------------+ FV Mid   Full                                                             +---------+---------------+---------+-----------+----------+-------------------+ FV DistalFull           Yes  Yes                                      +---------+---------------+---------+-----------+----------+-------------------+ PFV      Full           Yes      Yes                                      +---------+---------------+---------+-----------+----------+-------------------+ POP      Full           Yes      Yes                                      +---------+---------------+---------+-----------+----------+-------------------+ PTV      Full                                         Unable to visualize                                                       all segments        +---------+---------------+---------+-----------+----------+-------------------+ PERO     Full                                         Unbale to visualize                                                       all segments        +---------+---------------+---------+-----------+----------+-------------------+   Right Technical Findings: Not visualized segments include PTV and Peroneal. Unable to visualize all segment well enogh to fully evaluate. No significant chant from previous exam  +---------+---------------+---------+-----------+----------+-------------------+ LEFT     CompressibilityPhasicitySpontaneityPropertiesSummary             +---------+---------------+---------+-----------+----------+-------------------+ CFV      Full           Yes      Yes                                       +---------+---------------+---------+-----------+----------+-------------------+ SFJ      Full                                                             +---------+---------------+---------+-----------+----------+-------------------+ FV Prox  Full           Yes      Yes                                      +---------+---------------+---------+-----------+----------+-------------------+  FV Mid   Full                                                             +---------+---------------+---------+-----------+----------+-------------------+ FV DistalFull           Yes      Yes                                      +---------+---------------+---------+-----------+----------+-------------------+ PFV      Full           Yes      Yes                                      +---------+---------------+---------+-----------+----------+-------------------+ POP      Full           Yes      Yes                                      +---------+---------------+---------+-----------+----------+-------------------+ PTV      Full                                         Unable to visualize                                                       all segments        +---------+---------------+---------+-----------+----------+-------------------+ PERO     Full                                         Unable to visualize                                                       all segment.        +---------+---------------+---------+-----------+----------+-------------------+  Left Technical Findings: Not visualized segments include PTV and peroneal. Unable to visualize all segments well enough to fully evaluate.   Summary: Right: There is no evidence of deep vein thrombosis in the lower extremity. However, portions of this examination were limited- see technologist comments above. No cystic structure found in the popliteal fossa. See technical findings listed above. Left: There  is no evidence of deep vein thrombosis in the lower extremity. However, portions of this examination were limited- see technologist comments above. No cystic structure found in the popliteal fossa. See technical findings listed above.  *See table(s) above for measurements and observations. Electronically signed by Ruta Hinds MD on 02/16/2019 at 8:35:51 PM.    Final  ASSESSMENT AND PLAN:  This is a very pleasant 78 years old white female with a stage IA non-small cell lung cancer, squamous cell carcinoma presented with right lower lobe pulmonary nodule status post stereotactic radiotherapy.  The patient is currently on observation and she is feeling fine with no concerning complaints. She had repeat CT scan of the chest performed recently.  I personally and independently reviewed her scans and discussed the results with the patient. Her scan showed no concerning findings for disease recurrence. I recommended for her to continue on observation with repeat CT scan of the chest in 1 year. For the iron deficiency anemia, she was treated with Feraheme infusion in July 2019.  I also recommend for the patient to continue on oral iron tablets for now.   She was advised to call immediately if she has any concerning symptoms in the interval. All questions were answered. The patient knows to call the clinic with any problems, questions or concerns. We can certainly see the patient much sooner if necessary.  Disclaimer: This note was dictated with voice recognition software. Similar sounding words can inadvertently be transcribed and may not be corrected upon review.

## 2019-03-16 ENCOUNTER — Other Ambulatory Visit (HOSPITAL_COMMUNITY): Payer: Self-pay | Admitting: Interventional Radiology

## 2019-03-16 DIAGNOSIS — I6523 Occlusion and stenosis of bilateral carotid arteries: Secondary | ICD-10-CM

## 2019-03-25 ENCOUNTER — Encounter: Payer: Medicare Other | Admitting: Neurology

## 2019-04-01 ENCOUNTER — Ambulatory Visit (HOSPITAL_COMMUNITY)
Admission: RE | Admit: 2019-04-01 | Discharge: 2019-04-01 | Disposition: A | Discharge status: Home / Self Care | Payer: Medicare Other | Source: Ambulatory Visit | Attending: Interventional Radiology | Admitting: Interventional Radiology

## 2019-04-01 ENCOUNTER — Other Ambulatory Visit: Payer: Self-pay

## 2019-04-01 DIAGNOSIS — I6523 Occlusion and stenosis of bilateral carotid arteries: Secondary | ICD-10-CM

## 2019-04-06 ENCOUNTER — Telehealth (HOSPITAL_COMMUNITY): Payer: Self-pay

## 2019-04-06 ENCOUNTER — Telehealth: Payer: Self-pay | Admitting: Pulmonary Disease

## 2019-04-06 NOTE — Telephone Encounter (Signed)
Pt agreed to f/u in 1 year with [facility] head wo. AW

## 2019-04-06 NOTE — Telephone Encounter (Signed)
Spoke with pt's daughter. Pt has been scheduled for her flu shot on 04/07/2019 at 1335. Nothing further was needed.

## 2019-04-07 ENCOUNTER — Ambulatory Visit (INDEPENDENT_AMBULATORY_CARE_PROVIDER_SITE_OTHER): Payer: Medicare Other

## 2019-04-07 ENCOUNTER — Other Ambulatory Visit: Payer: Self-pay

## 2019-04-07 DIAGNOSIS — Z23 Encounter for immunization: Secondary | ICD-10-CM

## 2019-04-07 NOTE — Progress Notes (Signed)
Patient came to office today for influenza vaccine.  High dose influenza vaccine given.  Vaccine information given.  Nothing further at this time.

## 2019-05-14 ENCOUNTER — Ambulatory Visit: Payer: Medicare Other | Admitting: Neurology

## 2019-06-22 ENCOUNTER — Ambulatory Visit: Payer: Medicare Other | Admitting: Neurology

## 2019-08-18 ENCOUNTER — Other Ambulatory Visit: Payer: Self-pay

## 2019-08-18 DIAGNOSIS — Z9889 Other specified postprocedural states: Secondary | ICD-10-CM

## 2019-08-18 DIAGNOSIS — I6523 Occlusion and stenosis of bilateral carotid arteries: Secondary | ICD-10-CM

## 2019-08-18 DIAGNOSIS — I771 Stricture of artery: Secondary | ICD-10-CM

## 2019-08-19 ENCOUNTER — Ambulatory Visit (INDEPENDENT_AMBULATORY_CARE_PROVIDER_SITE_OTHER): Payer: Medicare Other | Admitting: Vascular Surgery

## 2019-08-19 ENCOUNTER — Encounter: Payer: Self-pay | Admitting: Vascular Surgery

## 2019-08-19 ENCOUNTER — Ambulatory Visit (INDEPENDENT_AMBULATORY_CARE_PROVIDER_SITE_OTHER)
Admission: RE | Admit: 2019-08-19 | Discharge: 2019-08-19 | Disposition: A | Discharge status: Home / Self Care | Payer: Medicare Other | Source: Ambulatory Visit | Attending: Vascular Surgery | Admitting: Vascular Surgery

## 2019-08-19 ENCOUNTER — Ambulatory Visit (HOSPITAL_COMMUNITY)
Admission: RE | Admit: 2019-08-19 | Discharge: 2019-08-19 | Disposition: A | Discharge status: Home / Self Care | Payer: Medicare Other | Source: Ambulatory Visit | Attending: Vascular Surgery | Admitting: Vascular Surgery

## 2019-08-19 ENCOUNTER — Other Ambulatory Visit: Payer: Self-pay

## 2019-08-19 VITALS — BP 160/70 | HR 59 | Temp 97.8°F | Resp 20 | Ht 62.0 in | Wt 166.8 lb

## 2019-08-19 DIAGNOSIS — I6523 Occlusion and stenosis of bilateral carotid arteries: Secondary | ICD-10-CM

## 2019-08-19 DIAGNOSIS — Z9889 Other specified postprocedural states: Secondary | ICD-10-CM | POA: Diagnosis present

## 2019-08-19 DIAGNOSIS — I771 Stricture of artery: Secondary | ICD-10-CM | POA: Insufficient documentation

## 2019-08-19 NOTE — Progress Notes (Signed)
Patient name: [name] MRN: [id number] DOB: 06-May-1941 Sex: female  REASON FOR VISIT:   Follow-up of carotid disease and bilateral iliac artery occlusive disease.  HPI:   [name] is a pleasant 79 y.o. female who I last saw on 08/20/2018.  Patient had a previous left carotid endarterectomy in 2011.  I have been following a moderate right carotid stenosis.  The time of her last visit this was in the 40 -59% range with no recurrent stenosis on the left.  She is also had bilateral iliac stents.  When I saw her last she had palpable pedal pulses.  She comes in for routine follow-up visit.  Since I saw her last she denies any history of claudication, rest pain, or nonhealing ulcers.  She is not a smoker.  She denies any history of stroke, TIAs, expressive or receptive aphasia, or amaurosis fugax.  She did fall and injured her right shoulder but is having some rotator cuff issues but otherwise there have been no significant changes in her medical history.  Past Medical History:  Diagnosis Date  . 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 Asthma   . 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  . Blood in stool    last week of aug 2018  . Carotid artery occlusion   . Complication of anesthesia   . COPD (chronic obstructive pulmonary disease) ([place])   . Coronary artery disease   . 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 Diverticulitis   . Dyspnea    with exertion  . Gastroesophageal reflux disease    occ  . Glaucoma    right eye  . Headache    tension  . Headache syndrome 11/27/2018  . Hiatal hernia   . Hyperlipidemia   . Hypertension   . Iliac artery aneurysm ([place])   . Lung cancer ([place]) dx 2018   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  . Myocardial infarction [facility]) 01/01/2000   Cardiac catheterization  . Peripheral vascular disease ([place])    stents in legs x 2 or 3  . Pneumonia      last time winter 2017 -2018  . PONV (postoperative nausea and vomiting)    occassionally, last colonscopy did ok with anesthesia  . Reflux     Family History  Problem Relation Age of Onset  . Heart disease Mother        Heart Disease before age 68  . Hypertension Mother   . Hyperlipidemia Mother   . Heart attack Mother   . Clotting disorder Mother   . Heart disease Father        Heart Disease before age 1  . Heart attack Father   . Hyperlipidemia Father   . Hypertension Father   . Heart disease Brother        Heart Disease before age 67  . Hyperlipidemia Brother   . Hypertension Brother   . Clotting disorder Brother   . AAA (abdominal aortic aneurysm) Brother   . Cerebral aneurysm Sister   . Hypertension Sister   . AAA (abdominal aortic aneurysm) Sister   . Asthma Sister   . Cerebral aneurysm Brother   . Cancer Brother        Lung  . Hypertension Brother   . Heart attack Brother   . Heart disease Brother        Aneurysm of Brain  . Hypertension  Brother   . Heart disease Brother   . Heart disease Brother   . Stroke Son        Aneurysm of Stomach  . AAA (abdominal aortic aneurysm) Son   . Cancer Maternal Uncle        great uncle/cancer/type unknown    SOCIAL HISTORY: Social History   Tobacco Use  . Smoking status: Former Smoker    Packs/day: 1.50    Years: 30.00    Pack years: 45.00    Types: Cigarettes    Quit date: 08/13/2000    Years since quitting: 19.0  . Smokeless tobacco: Never Used  Substance Use Topics  . Alcohol use: No    Alcohol/week: 0.0 standard drinks    Allergies  Allergen Reactions  . Azithromycin Swelling    Patient reported past history of lip swelling  . Codeine Other (See Comments)    Dr. Terrence Dupont advised patient not to take this medication  . Doxycycline Swelling    Mouth, lips, feet swelling  . Hydromorphone Palpitations and Other (See Comments)    DILAUDID  -  Pt had a Heart Attack after taking Dilaudid.  . Levaquin  [Levofloxacin] Other (See Comments)    Chest pressure, SOB, "pain in between shoulder blades", sweaty -as reported by patient per experience in ED this afternoon  . Avelox [Moxifloxacin Hcl In Nacl] Palpitations    Caused Heart Attack   . Oxycodone-Acetaminophen Other (See Comments)    Says it makes her feel weird  . Risedronate Other (See Comments)    Chest pain    Current Outpatient Medications  Medication Sig Dispense Refill  . acetaminophen (TYLENOL) 325 MG tablet Take 650 mg by mouth every 6 (six) hours as needed for moderate pain or headache.     Marland Kitchen amLODipine-olmesartan (AZOR) 5-40 MG tablet Take 1 tablet by mouth daily.    . cetirizine (ZYRTEC) 10 MG tablet Take 10 mg by mouth as needed for allergies.    Marland Kitchen clopidogrel (PLAVIX) 75 MG tablet Take 1 tablet (75 mg total) by mouth daily. 90 tablet 3  . dexlansoprazole (DEXILANT) 60 MG capsule Take 1 capsule (60 mg total) by mouth daily before breakfast. 30 capsule 11  . Fluticasone-Umeclidin-Vilant (TRELEGY ELLIPTA) 100-62.5-25 MCG/INH AEPB Inhale 1 puff into the lungs daily. 60 each 6  . hydrOXYzine (ATARAX/VISTARIL) 10 MG tablet Take by mouth.    . nitroGLYCERIN (NITROSTAT) 0.4 MG SL tablet Place 0.4 mg under the tongue every 5 (five) minutes as needed for chest pain.     . rosuvastatin (CRESTOR) 10 MG tablet Take 10 mg by mouth at bedtime.     Marland Kitchen spironolactone (ALDACTONE) 25 MG tablet Take 25 mg by mouth daily.    . valACYclovir (VALTREX) 1000 MG tablet Take by mouth.    . ferrous sulfate 325 (65 FE) MG tablet Take 1 tablet (325 mg total) by mouth daily with breakfast. 30 tablet 0   No current facility-administered medications for this visit.   Facility-Administered Medications Ordered in Other Visits  Medication Dose Route Frequency Provider Last Rate Last Admin  . promethazine (PHENERGAN) injection 6.25-12.5 mg  6.25-12.5 mg Intravenous Q15 min PRN Brennan Bailey, MD        REVIEW OF SYSTEMS:  [X]  denotes positive finding, [  ] denotes negative finding Cardiac  Comments:  Chest pain or chest pressure:    Shortness of breath upon exertion: x   Short of breath when lying flat: x   Irregular heart rhythm:  Vascular    Pain in calf, thigh, or hip brought on by ambulation:    Pain in feet at night that wakes you up from your sleep:     Blood clot in your veins:    Leg swelling:         Pulmonary    Oxygen at home:    Productive cough:     Wheezing:  x       Neurologic    Sudden weakness in arms or legs:     Sudden numbness in arms or legs:     Sudden onset of difficulty speaking or slurred speech:    Temporary loss of vision in one eye:     Problems with dizziness:  x       Gastrointestinal    Blood in stool:     Vomited blood:         Genitourinary    Burning when urinating:     Blood in urine:        Psychiatric    Major depression:         Hematologic    Bleeding problems:    Problems with blood clotting too easily:        Skin    Rashes or ulcers:        Constitutional    Fever or chills: x    PHYSICAL EXAM:   Vitals:   08/19/19 0929 08/19/19 0934  BP: (!) 158/78 (!) 160/70  Pulse: (!) 59   Resp: 20   Temp: 97.8 F (36.6 C)   SpO2: 97%   Weight: 166 lb 12.8 oz (75.7 kg)   Height: 5\' 2"  (1.575 m)     GENERAL: The patient is a well-nourished female, in no acute distress. The vital signs are documented above. CARDIAC: There is a regular rate and rhythm.  VASCULAR: She has bilateral carotid bruits. On the right side she has a palpable femoral pulse.  I cannot palpate pedal pulses however the right foot is warm and well-perfused. On the left side she has a palpable femoral, dorsalis pedis, and posterior tibial pulse. PULMONARY: There is good air exchange bilaterally without wheezing or rales. ABDOMEN: Soft and non-tender with normal pitched bowel sounds.  MUSCULOSKELETAL: There are no major deformities or cyanosis. NEUROLOGIC: No focal weakness or paresthesias are  detected. SKIN: There are no ulcers or rashes noted. PSYCHIATRIC: The patient has a normal affect.  DATA:    CAROTID DUPLEX: I have independently interpreted her carotid duplex scan today.  On the right side there is a 40 to 59% carotid stenosis which is stable compared to the study previously.  The right vertebral artery is patent with antegrade flow.  On the left side there left carotid endarterectomy site is widely patent without evidence of restenosis.  The left vertebral artery is patent with antegrade flow.  AORTOILIAC DUPLEX: I have independently interpreted her aortoiliac duplex scan today.  Was some limitations to the study because of some overlying bowel gas however there is biphasic flow throughout the common iliac and external iliac arteries bilaterally and no significant stenosis is noted in the aorta.  ARTERIAL DOPPLER STUDY: I have independently interpreted her arterial Doppler study today.  On the right side she has a triphasic dorsalis pedis and posterior tibial signal.  ABI is 99%.  Toe pressure 115 mmHg.  On the left side there is a triphasic dorsalis pedis and posterior tibial signal.  ABI is 100%.  Toe pressures 105 mmHg.  MEDICAL ISSUES:   BILATERAL CAROTID DISEASE: Her left carotid endarterectomy site (2011) is widely patent without evidence of recurrent stenosis.  She has a moderate 40 to 59% right carotid stenosis which is asymptomatic.  She is on Plavix and is on a statin.  I have ordered a follow-up carotid duplex scan in 1 year and I will see her back at that time.  She is not a smoker.  STATUS POST BILATERAL ILIAC STENTS: Her iliac stents are patent with biphasic signals throughout and no areas of significant stenosis noted.  ABIs are normal bilaterally.  I encouraged her to stay as active as possible.  She is not a smoker.  She is on Plavix and statin.  I have ordered follow-up ABIs and an aortoiliac duplex in 1 year and I will see her back at that time.  She  knows to call sooner if she has problems.  Deitra Mayo [facility] [phone]

## 2019-08-24 ENCOUNTER — Other Ambulatory Visit: Payer: Self-pay | Admitting: *Deleted

## 2019-08-24 DIAGNOSIS — I771 Stricture of artery: Secondary | ICD-10-CM

## 2019-08-24 DIAGNOSIS — I6523 Occlusion and stenosis of bilateral carotid arteries: Secondary | ICD-10-CM

## 2019-09-09 ENCOUNTER — Ambulatory Visit (INDEPENDENT_AMBULATORY_CARE_PROVIDER_SITE_OTHER): Payer: Medicare Other | Admitting: Pulmonary Disease

## 2019-09-09 ENCOUNTER — Other Ambulatory Visit: Payer: Self-pay

## 2019-09-09 ENCOUNTER — Encounter: Payer: Self-pay | Admitting: Pulmonary Disease

## 2019-09-09 VITALS — BP 120/70 | HR 57 | Ht 62.0 in | Wt 166.2 lb

## 2019-09-09 DIAGNOSIS — C3431 Malignant neoplasm of lower lobe, right bronchus or lung: Secondary | ICD-10-CM | POA: Diagnosis not present

## 2019-09-09 DIAGNOSIS — Z923 Personal history of irradiation: Secondary | ICD-10-CM

## 2019-09-09 DIAGNOSIS — Z7185 Encounter for immunization safety counseling: Secondary | ICD-10-CM

## 2019-09-09 DIAGNOSIS — R911 Solitary pulmonary nodule: Secondary | ICD-10-CM | POA: Diagnosis not present

## 2019-09-09 DIAGNOSIS — R0602 Shortness of breath: Secondary | ICD-10-CM

## 2019-09-09 DIAGNOSIS — Z87891 Personal history of nicotine dependence: Secondary | ICD-10-CM

## 2019-09-09 DIAGNOSIS — Z951 Presence of aortocoronary bypass graft: Secondary | ICD-10-CM

## 2019-09-09 DIAGNOSIS — Z712 Person consulting for explanation of examination or test findings: Secondary | ICD-10-CM

## 2019-09-09 DIAGNOSIS — Z7189 Other specified counseling: Secondary | ICD-10-CM

## 2019-09-09 DIAGNOSIS — J449 Chronic obstructive pulmonary disease, unspecified: Secondary | ICD-10-CM

## 2019-09-09 NOTE — Progress Notes (Signed)
Synopsis: Referred in January 2020 to establish care, former patient of Dr. Lenna Gilford   Subjective:   PATIENT ID: [name] GENDER: female DOB: August 02, 1941, MRN: [id number]  Chief Complaint  Patient presents with  . Follow-up    Pt states she has been okay since last visit. Pt states when she exerts herself, she will become SOB and will have tightness in chest due to it.    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  She is followed with Dr. Earlie Server and Dr. Tammi Klippel.  In the oncology services.  She has an appointment with him tomorrow.  She had a recent CT scan completed yesterday which reveals right lower lobe groundglass opacities and scarring after stereotactic radiation to the right lower lobe.  She also has a small 7 mm central nodular location that will need to be followed.  She had PFTs completed today prior to her office visit.  The pulmonary function test revealed moderate COPD with an FEV1 65% postbronchodilator reduced ratio of 66, significant bronchodilator response with greater than 12% improvement, as well as a reduced DLCO 55% predicted.  Patient states today that she quit using her Anora inhaler several months ago because she did not know if it was making much difference in her breathing symptoms.  Before that she liked being on it.  There was some confusion regarding stopping blood pressure medication Azor versus stopping her Anoro.  She may have misunderstood that that information regarding changing of medications.  However she does have a stock supply of an oral at home that she could restart.  She did think it made a little bit of difference but was not sure.  At baseline she is dyspneic on exertion but she does not have any significant cough or daily sputum production.  OV 02/19/2019: recently in the hospital for TIA symptoms. No stroke. Now on plavix, taken off ASA.  Followed planned with Dr. Julien Nordmann from oncology on 15th. Doing well since hospitalization. Concerned about her o2 levels. They were low a few times in there hospital. Quit smoking in 2001.   OV 09/09/2019: Patient seen today for follow-up regarding her COPD.  Her symptoms are stable.  She denies chest pain nausea vomiting diarrhea.  She does have some shortness of breath with exertion.  She lives at home.  She is able to complete her activities of daily living.  She does have questions today regarding the Covid vaccine.  Patient was counseled on Covid vaccination and encouraged to obtain this next available opportunity.    Past Medical History:  Diagnosis Date  . 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 Asthma   . 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  . Blood in stool    last week of aug 2018  . Carotid artery occlusion   . Complication of anesthesia   . COPD (chronic obstructive pulmonary disease) ([place])   . Coronary artery disease   . 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 Diverticulitis   . Dyspnea    with exertion  . Gastroesophageal reflux disease    occ  . Glaucoma    right eye  . Headache    tension  . Headache syndrome 11/27/2018  . Hiatal hernia   . Hyperlipidemia   . Hypertension   . Iliac artery aneurysm ([place])   .  Lung cancer ([place]) dx 2018   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  . Myocardial infarction [facility]) 01/01/2000   Cardiac catheterization  . Peripheral vascular disease ([employer])    stents in legs x 2 or 3  . Pneumonia    last time winter 2017 -2018  . PONV (postoperative nausea and vomiting)    occassionally, last colonscopy did ok with anesthesia  . Reflux      Family History  Problem Relation Age of Onset  . Heart disease Mother        Heart Disease before age 8  . Hypertension Mother   . Hyperlipidemia Mother   . Heart attack Mother   . Clotting  disorder Mother   . Heart disease Father        Heart Disease before age 27  . Heart attack Father   . Hyperlipidemia Father   . Hypertension Father   . Heart disease Brother        Heart Disease before age 37  . Hyperlipidemia Brother   . Hypertension Brother   . Clotting disorder Brother   . AAA (abdominal aortic aneurysm) Brother   . Cerebral aneurysm Sister   . Hypertension Sister   . AAA (abdominal aortic aneurysm) Sister   . Asthma Sister   . Cerebral aneurysm Brother   . Cancer Brother        Lung  . Hypertension Brother   . Heart attack Brother   . Heart disease Brother        Aneurysm of Brain  . Hypertension Brother   . Heart disease Brother   . Heart disease Brother   . Stroke Son        Aneurysm of Stomach  . AAA (abdominal aortic aneurysm) Son   . Cancer Maternal Uncle        great uncle/cancer/type unknown     Past Surgical History:  Procedure Laterality Date  . ABDOMINAL HYSTERECTOMY    . APPENDECTOMY    . Arnold-chiari malformation repair  1998   Suboccipital craniectomy  . CAROTID ENDARTERECTOMY  03/29/2010   Left  CEA  . CHOLECYSTECTOMY     Gall Bladder  . COLONOSCOPY WITH PROPOFOL N/A 04/22/2015   Procedure: COLONOSCOPY WITH PROPOFOL;  Surgeon: Carol Ada, MD;  Location: [facility];  Service: Endoscopy;  Laterality: N/A;  . COLONOSCOPY WITH PROPOFOL N/A 05/25/2016   Procedure: COLONOSCOPY WITH PROPOFOL;  Surgeon: Carol Ada, MD;  Location: [facility];  Service: Endoscopy;  Laterality: N/A;  . COLONOSCOPY WITH PROPOFOL N/A 05/03/2017   Procedure: COLONOSCOPY WITH PROPOFOL;  Surgeon: Carol Ada, MD;  Location: [facility];  Service: Endoscopy;  Laterality: N/A;  . CORNEAL TRANSPLANT     Right  . CORONARY ARTERY BYPASS GRAFT  01/01/2000   x 3  . ENTEROSCOPY N/A 02/27/2018   Procedure: ENTEROSCOPY;  Surgeon: Carol Ada, MD;  Location: [facility];  Service: Endoscopy;  Laterality: N/A;  . ENTEROSCOPY N/A 07/18/2018   Procedure:  ENTEROSCOPY;  Surgeon: Carol Ada, MD;  Location: [facility];  Service: Endoscopy;  Laterality: N/A;  . ESOPHAGOGASTRODUODENOSCOPY N/A 05/25/2016   Procedure: ESOPHAGOGASTRODUODENOSCOPY (EGD);  Surgeon: Carol Ada, MD;  Location: [facility];  Service: Endoscopy;  Laterality: N/A;  . ESOPHAGOGASTRODUODENOSCOPY N/A 08/01/2018   Procedure: ESOPHAGOGASTRODUODENOSCOPY (EGD);  Surgeon: Carol Ada, MD;  Location: [facility];  Service: Endoscopy;  Laterality: N/A;  . EYE SURGERY Right 1995 or 1996   Laser surgery for retinal hemorrhage  . GIVENS CAPSULE STUDY N/A  07/16/2018   Procedure: GIVENS CAPSULE STUDY;  Surgeon: Carol Ada, MD;  Location: [facility];  Service: Endoscopy;  Laterality: N/A;  . HOT HEMOSTASIS N/A 02/27/2018   Procedure: HOT HEMOSTASIS (ARGON PLASMA COAGULATION/BICAP);  Surgeon: Carol Ada, MD;  Location: [facility];  Service: Endoscopy;  Laterality: N/A;  . HOT HEMOSTASIS N/A 08/01/2018   Procedure: HOT HEMOSTASIS (ARGON PLASMA COAGULATION/BICAP);  Surgeon: Carol Ada, MD;  Location: [facility];  Service: Endoscopy;  Laterality: N/A;  . IR RADIOLOGIST EVAL & MGMT  12/14/2016  . LEFT HEART CATH AND CORS/GRAFTS ANGIOGRAPHY N/A 01/09/2018   Procedure: LEFT HEART CATH AND CORS/GRAFTS ANGIOGRAPHY;  Surgeon: Charolette Forward, MD;  Location: [facility];  Service: Cardiovascular;  Laterality: N/A;  . LEFT HEART CATHETERIZATION WITH CORONARY ANGIOGRAM N/A 08/03/2014   Procedure: LEFT HEART CATHETERIZATION WITH CORONARY ANGIOGRAM;  Surgeon: Birdie Riddle, MD;  Location: [facility];  Service: Cardiovascular;  Laterality: N/A;  . Post Coronary Artery  BPG  01/05/2000   Right jugular sheath removed  . PR VEIN BYPASS GRAFT,AORTO-FEM-POP    . ROTATOR CUFF REPAIR     Right    Social History   Socioeconomic History  . Marital status: Widowed    Spouse name: Not on file  . Number of children: 4  . Years of education: 7TH  . Highest education level: Not on file   Occupational History  . Not on file  Tobacco Use  . Smoking status: Former Smoker    Packs/day: 1.50    Years: 30.00    Pack years: 45.00    Types: Cigarettes    Quit date: 08/13/2000    Years since quitting: 19.0  . Smokeless tobacco: Never Used  Substance and Sexual Activity  . Alcohol use: No    Alcohol/week: 0.0 standard drinks  . Drug use: No  . Sexual activity: Never  Other Topics Concern  . Not on file  Social History Narrative   Lives at home w/ her son   Right-handed   Drinks coffee and [employer]eps[employer] daily   Social Determinants of Health   Financial Resource Strain:   . Difficulty of Paying Living Expenses: Not on file  Food Insecurity:   . Worried About Charity fundraiser in the Last Year: Not on file  . Ran Out of Food in the Last Year: Not on file  Transportation Needs:   . Lack of Transportation (Medical): Not on file  . Lack of Transportation (Non-Medical): Not on file  Physical Activity:   . Days of Exercise per Week: Not on file  . Minutes of Exercise per Session: Not on file  Stress:   . Feeling of Stress : Not on file  Social Connections:   . Frequency of Communication with Friends and Family: Not on file  . Frequency of Social Gatherings with Friends and Family: Not on file  . Attends Religious Services: Not on file  . Active Member of Clubs or Organizations: Not on file  . Attends Archivist Meetings: Not on file  . Marital Status: Not on file  Intimate Partner Violence:   . Fear of Current or Ex-Partner: Not on file  . Emotionally Abused: Not on file  . Physically Abused: Not on file  . Sexually Abused: Not on file     Allergies  Allergen Reactions  . Azithromycin Swelling    Patient reported past history of lip swelling  . Codeine Other (See Comments)    Dr. Terrence Dupont advised patient  not to take this medication  . Doxycycline Swelling    Mouth, lips, feet swelling  . Hydromorphone Palpitations and Other (See Comments)    DILAUDID  -   Pt had a Heart Attack after taking Dilaudid.  . Levaquin [Levofloxacin] Other (See Comments)    Chest pressure, SOB, "pain in between shoulder blades", sweaty -as reported by patient per experience in ED this afternoon  . Avelox [Moxifloxacin Hcl In Nacl] Palpitations    Caused Heart Attack   . Oxycodone-Acetaminophen Other (See Comments)    Says it makes her feel weird  . Risedronate Other (See Comments)    Chest pain     Outpatient Medications Prior to Visit  Medication Sig Dispense Refill  . acetaminophen (TYLENOL) 325 MG tablet Take 650 mg by mouth every 6 (six) hours as needed for moderate pain or headache.     Marland Kitchen amLODipine-olmesartan (AZOR) 5-40 MG tablet Take 1 tablet by mouth daily.    . cetirizine (ZYRTEC) 10 MG tablet Take 10 mg by mouth as needed for allergies.    Marland Kitchen clopidogrel (PLAVIX) 75 MG tablet Take 1 tablet (75 mg total) by mouth daily. 90 tablet 3  . dexlansoprazole (DEXILANT) 60 MG capsule Take 1 capsule (60 mg total) by mouth daily before breakfast. 30 capsule 11  . Fluticasone-Umeclidin-Vilant (TRELEGY ELLIPTA) 100-62.5-25 MCG/INH AEPB Inhale 1 puff into the lungs daily. 60 each 6  . hydrOXYzine (ATARAX/VISTARIL) 10 MG tablet Take by mouth.    . nitroGLYCERIN (NITROSTAT) 0.4 MG SL tablet Place 0.4 mg under the tongue every 5 (five) minutes as needed for chest pain.     . rosuvastatin (CRESTOR) 10 MG tablet Take 10 mg by mouth at bedtime.     Marland Kitchen spironolactone (ALDACTONE) 25 MG tablet Take 25 mg by mouth daily.    . ferrous sulfate 325 (65 FE) MG tablet Take 1 tablet (325 mg total) by mouth daily with breakfast. 30 tablet 0  . valACYclovir (VALTREX) 1000 MG tablet Take by mouth.     Facility-Administered Medications Prior to Visit  Medication Dose Route Frequency Provider Last Rate Last Admin  . promethazine (PHENERGAN) injection 6.25-12.5 mg  6.25-12.5 mg Intravenous Q15 min PRN Brennan Bailey, MD        Review of Systems  Constitutional: Negative for chills,  fever, malaise/fatigue and weight loss.  HENT: Negative for hearing loss, sore throat and tinnitus.   Eyes: Negative for blurred vision and double vision.  Respiratory: Positive for cough and shortness of breath. Negative for hemoptysis, sputum production, wheezing and stridor.   Cardiovascular: Negative for chest pain, palpitations, orthopnea, leg swelling and PND.  Gastrointestinal: Negative for abdominal pain, constipation, diarrhea, heartburn, nausea and vomiting.  Genitourinary: Negative for dysuria, hematuria and urgency.  Musculoskeletal: Negative for joint pain and myalgias.  Skin: Negative for itching and rash.  Neurological: Negative for dizziness, tingling, weakness and headaches.  Endo/Heme/Allergies: Negative for environmental allergies. Does not bruise/bleed easily.  Psychiatric/Behavioral: Negative for depression. The patient is not nervous/anxious and does not have insomnia.   All other systems reviewed and are negative.    Objective:  Physical Exam Vitals reviewed.  Constitutional:      General: She is not in acute distress.    Appearance: She is well-developed.  HENT:     Head: Normocephalic and atraumatic.  Eyes:     General: No scleral icterus.    Conjunctiva/sclera: Conjunctivae normal.     Pupils: Pupils are equal, round, and reactive to light.  Neck:     Vascular: No JVD.     Trachea: No tracheal deviation.  Cardiovascular:     Rate and Rhythm: Normal rate and regular rhythm.     Heart sounds: Normal heart sounds. No murmur.  Pulmonary:     Effort: Pulmonary effort is normal. No tachypnea, accessory muscle usage or respiratory distress.     Breath sounds: No stridor. No wheezing, rhonchi or rales.  Musculoskeletal:        General: No tenderness.     Cervical back: Neck supple.  Lymphadenopathy:     Cervical: No cervical adenopathy.  Skin:    General: Skin is warm and dry.     Capillary Refill: Capillary refill takes less than 2 seconds.      Findings: No rash.  Neurological:     Mental Status: She is alert and oriented to person, place, and time.  Psychiatric:        Behavior: Behavior normal.     Vitals:   09/09/19 1155  BP: 120/70  Pulse: (!) 57  SpO2: 98%  Weight: 166 lb 3.2 oz (75.4 kg)  Height: 5\' 2"  (1.575 m)   98% on RA BMI Readings from Last 3 Encounters:  09/09/19 30.40 kg/m  08/19/19 30.51 kg/m  02/25/19 30.47 kg/m   Wt Readings from Last 3 Encounters:  09/09/19 166 lb 3.2 oz (75.4 kg)  08/19/19 166 lb 12.8 oz (75.7 kg)  02/25/19 166 lb 9.6 oz (75.6 kg)   Chest Imaging:  08/25/2018 CT Chest:  Imaging reviewed.  Right lower lobe areas of groundglass and scarring status post sterotactic radiation likely radiation-induced parenchymal scarring.  Small nodular density within this area 7 mm in size.  She also has a new associated/adjacent rib fracture within that right side which may be concerning and would need to be followed for any recurrence of squamous cell disease. The patient's images have been independently reviewed by me.    02/23/2019 CT Chest: Follow up scheduled for cancer eval and staging    Pulmonary Functions Testing Results: PFT Results Latest Ref Rng & Units 08/26/2018 02/14/2018 04/10/2016  FVC-Pre L 1.69 1.80 1.53  FVC-Predicted Pre % 65 69 57  FVC-Post L 1.91 1.99 1.82  FVC-Predicted Post % 74 77 69  Pre FEV1/FVC % % 67 72 71  Post FEV1/FCV % % 66 72 70  FEV1-Pre L 1.12 1.30 1.09  FEV1-Predicted Pre % 58 67 54  FEV1-Post L 1.26 1.43 1.28  DLCO UNC% % 55 - 51  DLCO COR %Predicted % 97 - 86  TLC L 3.85 - 3.77  TLC % Predicted % 81 - 79  RV % Predicted % 94 - 96     FeNO: None   Pathology:   Squamous cell carcinoma of the lung right lower lobe status post stereotactic radiation therapy  Echocardiogram: none recent   Heart Catheterization:   Jan 09, 2018: Dr. Nelta Numbers LAD to Prox LAD lesion is 80% stenosed.  Ost 1st Diag lesion is 100% stenosed.  Prox RCA lesion  is 20% stenosed.  Mid RCA lesion is 30% stenosed.  Mid LM to Dist LM lesion is 40% stenosed.  Ost Ramus lesion is 40% stenosed.  Ost Cx lesion is 40% stenosed.  Origin lesion is 100% stenosed.  Origin to Prox Graft lesion is 100% stenosed.  The left ventricular systolic function is normal.  LV end diastolic pressure is normal.  The left ventricular ejection fraction is 50-55% by visual estimate.  Assessment & Plan:     ICD-10-CM   1. Stage 2 moderate COPD by GOLD classification ([place])  J44.9   2. Primary cancer of right lower lobe of lung (HCC)  C34.31   3. Hx of radiation therapy  Z92.3   4. S/P CABG x 3  Z95.1   5. Vaccine counseling  Z71.89     Assessment:   This is a 79 year old female past medical history of stage II COPD, history of stage I squamous cell carcinoma of the lung status post SBRT.  Patient had follow-up imaging for her lung nodule completed February 23, 2019.  This revealed no evidence of metastatic disease and evidence of presumed radiation change to the right lower lobe with stable nodularity.  Needs criteria and age for Covid vaccine.  Plan Following Extensive Data Review & Interpretation:  . I reviewed prior external note(s) from Dr. Bobette Mo, 08/19/2019 vascular surgery follow-up for bilateral carotid disease and bilateral iliac stents.  06/15/2019 office visit with primary care treated for shingles. . I reviewed the result(s) of lab work from oncology visit July 2020, hemoglobin stable, serum creatinine 1.5, bicarb 25 . I have ordered no additional studies at this time.   Independent interpretation of tests . Review of patient's CT chest July 2020 images revealed no evidence of metastatic disease evidence of radiation changes from previous right lower lobe nodule. The patient's images have been independently reviewed by me.    Patient was counseled today in the office for obtaining Covid vaccine at next available appointment.  Patient return to our  clinic in 1 year or as needed based on symptoms.    Current Outpatient Medications:  .  acetaminophen (TYLENOL) 325 MG tablet, Take 650 mg by mouth every 6 (six) hours as needed for moderate pain or headache. , Disp: , Rfl:  .  amLODipine-olmesartan (AZOR) 5-40 MG tablet, Take 1 tablet by mouth daily., Disp: , Rfl:  .  cetirizine (ZYRTEC) 10 MG tablet, Take 10 mg by mouth as needed for allergies., Disp: , Rfl:  .  clopidogrel (PLAVIX) 75 MG tablet, Take 1 tablet (75 mg total) by mouth daily., Disp: 90 tablet, Rfl: 3 .  dexlansoprazole (DEXILANT) 60 MG capsule, Take 1 capsule (60 mg total) by mouth daily before breakfast., Disp: 30 capsule, Rfl: 11 .  Fluticasone-Umeclidin-Vilant (TRELEGY ELLIPTA) 100-62.5-25 MCG/INH AEPB, Inhale 1 puff into the lungs daily., Disp: 60 each, Rfl: 6 .  hydrOXYzine (ATARAX/VISTARIL) 10 MG tablet, Take by mouth., Disp: , Rfl:  .  nitroGLYCERIN (NITROSTAT) 0.4 MG SL tablet, Place 0.4 mg under the tongue every 5 (five) minutes as needed for chest pain. , Disp: , Rfl:  .  rosuvastatin (CRESTOR) 10 MG tablet, Take 10 mg by mouth at bedtime. , Disp: , Rfl:  .  spironolactone (ALDACTONE) 25 MG tablet, Take 25 mg by mouth daily., Disp: , Rfl:  .  ferrous sulfate 325 (65 FE) MG tablet, Take 1 tablet (325 mg total) by mouth daily with breakfast., Disp: 30 tablet, Rfl: 0 .  valACYclovir (VALTREX) 1000 MG tablet, Take by mouth., Disp: , Rfl:  No current facility-administered medications for this visit.  Facility-Administered Medications Ordered in Other Visits:  .  promethazine (PHENERGAN) injection 6.25-12.5 mg, 6.25-12.5 mg, Intravenous, Q15 min PRN, Daiva Huge, Leanord Hawking, MD   Garner Nash, DO [facility] 09/09/2019 12:07 PM

## 2019-09-09 NOTE — Patient Instructions (Addendum)
Thank you for visiting Dr. Valeta Harms at [facility]. Today we recommend the following:  Get covid vaccine first available.  Call contact your pharmacy  Continue your trelegy   Return in about 1 year (around 09/08/2020).    Please do your part to reduce the spread of COVID-19.

## 2019-09-28 ENCOUNTER — Ambulatory Visit: Payer: Medicare Other | Attending: Internal Medicine

## 2019-09-28 DIAGNOSIS — Z23 Encounter for immunization: Secondary | ICD-10-CM | POA: Insufficient documentation

## 2019-09-28 NOTE — Progress Notes (Signed)
   Covid-19 Vaccination Clinic  Name:  JARIYAH HACKLEY    MRN: [id number] DOB: September 17, 1940  09/28/2019  Ms. Campos was observed post Covid-19 immunization for 15 minutes without incidence. She was provided with Vaccine Information Sheet and instruction to access the V-Safe system.   Ms. Amenta was instructed to call 911 with any severe reactions post vaccine: Marland Kitchen Difficulty breathing  . Swelling of your face and throat  . A fast heartbeat  . A bad rash all over your body  . Dizziness and weakness    Immunizations Administered    Name Date Dose VIS Date Route   Pfizer COVID-19 Vaccine 09/28/2019  7:19 PM 0.3 mL 07/24/2019 Intramuscular   Manufacturer: [place]   Lot: [id number]   [place]: [id number]

## 2019-10-20 ENCOUNTER — Ambulatory Visit: Payer: Medicare Other | Attending: Internal Medicine

## 2019-10-20 DIAGNOSIS — Z23 Encounter for immunization: Secondary | ICD-10-CM

## 2019-10-20 NOTE — Progress Notes (Signed)
   Covid-19 Vaccination Clinic  Name:  LINDSAY STRAKA    MRN: [id number] DOB: 24-Mar-1941  10/20/2019  Ms. Acocella was observed post Covid-19 immunization for 15 minutes without incident. She was provided with Vaccine Information Sheet and instruction to access the V-Safe system.   Ms. Tocco was instructed to call 911 with any severe reactions post vaccine: Marland Kitchen Difficulty breathing  . Swelling of face and throat  . A fast heartbeat  . A bad rash all over body  . Dizziness and weakness   Immunizations Administered    Name Date Dose VIS Date Route   Pfizer COVID-19 Vaccine 10/20/2019 11:27 AM 0.3 mL 07/24/2019 Intramuscular   Manufacturer: [place]   Lot: [id number]   [place]: [id number]

## 2019-10-21 ENCOUNTER — Ambulatory Visit: Payer: Medicare Other

## 2019-11-09 ENCOUNTER — Telehealth: Payer: Self-pay

## 2019-11-09 ENCOUNTER — Other Ambulatory Visit: Payer: Self-pay | Admitting: Emergency Medicine

## 2019-11-09 DIAGNOSIS — C3490 Malignant neoplasm of unspecified part of unspecified bronchus or lung: Secondary | ICD-10-CM

## 2019-11-09 NOTE — Telephone Encounter (Signed)
Spoke with pt's daughter. She verbalizes that the pt would like an appt with symptom management clinic per MD [facility]. Pt's wife verbalizes understanding that the scheduling department will reach out to her for appointment times

## 2019-11-09 NOTE — Telephone Encounter (Signed)
Call back placed to pt's daughter as requested. Pt's daughter states that her mother "is sick as a dog and is pale and short of breath". Pt's daughter is requesting an appt with MD Julien Nordmann "sooner than July". Pt's daughter states that her mother has not seen her PCP because "she doesn't do video calls". This RN informed her that MD Julien Nordmann would be notified of her concerns. Pt's daughter verbalizes understanding

## 2019-11-09 NOTE — Telephone Encounter (Signed)
-----   Message from Curt Bears, MD sent at 11/09/2019 11:28 AM EDT ----- Regarding: RE: Please advise She can come to our symptom management for a visit and then we can arrange for a scan if needed. ----- Message ----- From: Veverly Fells, RN Sent: 11/09/2019  10:59 AM EDT To: Curt Bears, MD, Ardeen Garland, RN Subject: Please advise                                  Pt's daughter called and is requesting an appointment with you sooner than her annual July follow-up and CT.   The pt's daughter states that the pt has been pale and short of breath more. Her daughter also states that the pt is resistant to go see her PCP because she doesn't want "a video visit". How do you want me to proceed? Go to symptom management?  Thanks!

## 2019-11-10 ENCOUNTER — Inpatient Hospital Stay: Payer: Medicare Other

## 2019-11-10 ENCOUNTER — Other Ambulatory Visit: Payer: Self-pay | Admitting: Medical

## 2019-11-10 ENCOUNTER — Inpatient Hospital Stay: Payer: Medicare Other | Attending: Medical | Admitting: Medical

## 2019-11-10 ENCOUNTER — Ambulatory Visit (HOSPITAL_COMMUNITY)
Admission: RE | Admit: 2019-11-10 | Discharge: 2019-11-10 | Disposition: A | Discharge status: Home / Self Care | Payer: Medicare Other | Source: Ambulatory Visit | Attending: Medical | Admitting: Medical

## 2019-11-10 ENCOUNTER — Other Ambulatory Visit: Payer: Self-pay

## 2019-11-10 VITALS — BP 134/48 | HR 66 | Temp 98.5°F | Resp 19 | Ht 62.0 in | Wt 165.7 lb

## 2019-11-10 DIAGNOSIS — J449 Chronic obstructive pulmonary disease, unspecified: Secondary | ICD-10-CM | POA: Insufficient documentation

## 2019-11-10 DIAGNOSIS — R5383 Other fatigue: Secondary | ICD-10-CM | POA: Insufficient documentation

## 2019-11-10 DIAGNOSIS — C3431 Malignant neoplasm of lower lobe, right bronchus or lung: Secondary | ICD-10-CM | POA: Insufficient documentation

## 2019-11-10 DIAGNOSIS — C3490 Malignant neoplasm of unspecified part of unspecified bronchus or lung: Secondary | ICD-10-CM

## 2019-11-10 DIAGNOSIS — R0602 Shortness of breath: Secondary | ICD-10-CM

## 2019-11-10 DIAGNOSIS — D509 Iron deficiency anemia, unspecified: Secondary | ICD-10-CM

## 2019-11-10 DIAGNOSIS — Z87891 Personal history of nicotine dependence: Secondary | ICD-10-CM | POA: Diagnosis not present

## 2019-11-10 DIAGNOSIS — R531 Weakness: Secondary | ICD-10-CM | POA: Insufficient documentation

## 2019-11-10 DIAGNOSIS — R231 Pallor: Secondary | ICD-10-CM

## 2019-11-10 LAB — CMP (CANCER CENTER ONLY)
ALT: 8 U/L (ref 0–44)
AST: 16 U/L (ref 15–41)
Albumin: 3.8 g/dL (ref 3.5–5.0)
Alkaline Phosphatase: 97 U/L (ref 38–126)
Anion gap: 11 (ref 5–15)
BUN: 28 mg/dL — ABNORMAL HIGH (ref 8–23)
CO2: 22 mmol/L (ref 22–32)
Calcium: 9.4 mg/dL (ref 8.9–10.3)
Chloride: 107 mmol/L (ref 98–111)
Creatinine: 1.91 mg/dL — ABNORMAL HIGH (ref 0.44–1.00)
GFR, Est AFR Am: 29 mL/min — ABNORMAL LOW (ref >60)
GFR, Estimated: 25 mL/min — ABNORMAL LOW (ref >60)
Glucose, Bld: 119 mg/dL — ABNORMAL HIGH (ref 70–99)
Potassium: 4.3 mmol/L (ref 3.5–5.1)
Sodium: 140 mmol/L (ref 135–145)
Total Bilirubin: 0.2 mg/dL — ABNORMAL LOW (ref 0.3–1.2)
Total Protein: 7.7 g/dL (ref 6.5–8.1)

## 2019-11-10 LAB — CBC WITH DIFFERENTIAL (CANCER CENTER ONLY)
Abs Immature Granulocytes: 0.02 10*3/uL (ref 0.00–0.07)
Basophils Absolute: 0.1 10*3/uL (ref 0.0–0.1)
Basophils Relative: 1 %
Eosinophils Absolute: 0.3 10*3/uL (ref 0.0–0.5)
Eosinophils Relative: 3 %
HCT: 32.4 % — ABNORMAL LOW (ref 36.0–46.0)
Hemoglobin: 10.4 g/dL — ABNORMAL LOW (ref 12.0–15.0)
Immature Granulocytes: 0 %
Lymphocytes Relative: 32 %
Lymphs Abs: 3.1 10*3/uL (ref 0.7–4.0)
MCH: 29.7 pg (ref 26.0–34.0)
MCHC: 32.1 g/dL (ref 30.0–36.0)
MCV: 92.6 fL (ref 80.0–100.0)
Monocytes Absolute: 0.7 10*3/uL (ref 0.1–1.0)
Monocytes Relative: 7 %
Neutro Abs: 5.6 10*3/uL (ref 1.7–7.7)
Neutrophils Relative %: 57 %
Platelet Count: 291 10*3/uL (ref 150–400)
RBC: 3.5 MIL/uL — ABNORMAL LOW (ref 3.87–5.11)
RDW: 13.3 % (ref 11.5–15.5)
WBC Count: 9.8 10*3/uL (ref 4.0–10.5)
nRBC: 0 % (ref 0.0–0.2)

## 2019-11-10 LAB — LACTATE DEHYDROGENASE: LDH: 152 U/L (ref 98–192)

## 2019-11-10 MED ORDER — PREDNISONE 5 MG PO TABS: ORAL_TABLET | ORAL | 0 refills | Status: DC

## 2019-11-10 MED ORDER — AMOXICILLIN-POT CLAVULANATE 875-125 MG PO TABS: 1.0000 | ORAL_TABLET | Freq: Two times a day (BID) | ORAL | 0 refills | Status: DC

## 2019-11-10 NOTE — Progress Notes (Signed)
`  [facility] Progress Note   EUN VERMEER [id number] 1940-10-29 79 y.o.  [name] is managed by Dr. Fanny Bien. Mohamed  Actively treated with chemotherapy/immunotherapy/hormonal therapy: observation  Next scheduled appointment with provider: 02/29/2020  Assessment: Plan:    Primary cancer of right lower lobe of lung ([place])  Shortness of breath  Pale skin  Iron deficiency anemia, unspecified iron deficiency anemia type   Primary cancer of the right lower lobe: The patient is status post stereotactic radiation and is under observation.  She is scheduled to be seen by Dr. Julien Nordmann in follow-up on 03/03/2020.  She has CT scans and labs scheduled prior to return.  Shortness of breath, pallor in a setting of iron deficiency anemia: A CBC, chemistry panel, iron studies, and a sample has been sent to blood bank.  Lab results are as follows:  A CBC returned with a hemoglobin of 12.4, adequate of 32.4.  Iron studies are pending.  A chest x-ray was completed which returned showing:  FINDINGS: Sequelae of CABG are again identified.  The cardiomediastinal silhouette is unchanged with normal heart size.  The lungs are mildly hyperinflated.  Chronic streaky opacity in the right lower lobe is unchanged and compatible with post radiation changes.  No acute airspace consolidation, edema, pleural effusion, pneumothorax is identified.  A chronic posterior right seventh rib fracture is noted.  IMPRESSION: Chronic changes without evidence of acute cardiopulmonary disease.  It is believed that the patient's shortness of breath was likely related to a COPD exacerbation given her symptoms and physical exam.  She was placed on a 6-day low-dose prednisone taper and Augmentin 875-125 p.o. twice daily x7 days.  Please see After Visit Summary for patient specific instructions.  Future Appointments  Date Time Provider [place]  11/10/2019  3:00 PM [facility] 6  [facility] None  11/10/2019  3:30 PM Aarini Slee, Lucianne Lei E., PA-C [facility] None  02/26/2020  9:00 AM [facility] [place]  02/26/2020 10:00 AM [facility] 4 [facility] None  02/29/2020 10:15 AM Curt Bears, MD [facility] None    No orders of the defined types were placed in this encounter.      Subjective:   Patient ID:  [name] is a 79 y.o. (DOB June 11, 1941) female.  Chief Complaint: No chief complaint on file.   HPI [name]  is a 79 y.o. female with a diagnosis of a primary cancer of the right lower lobe.  She is status post stereotactic radiation and is under observation by Dr. Julien Nordmann whom she will see on 03/03/2020.  The patient also has a history of iron deficiency anemia.  Her daughter called yesterday stating that she was more short of breath and was pale.  A CBC, chemistry panel, iron studies, and a sample has been sent to blood bank.   Her labs today did not show evidence of progressive anemia.  She continues using inhalers that were prescribed to her by her pulmonologist.  She reports having a sensation of being cold but no fevers or sweats.  She has dyspnea on exertion, shortness of breath, and a productive cough with clear phlegm.  Medications: I have reviewed the patient's current medications.  Allergies:  Allergies  Allergen Reactions  . Azithromycin Swelling    Patient reported past history of lip swelling  . Codeine Other (See Comments)    Dr. Terrence Dupont advised patient not to take this medication  . Doxycycline Swelling    Mouth, lips,  feet swelling  . Hydromorphone Palpitations and Other (See Comments)    DILAUDID  -  Pt had a Heart Attack after taking Dilaudid.  . Levaquin [Levofloxacin] Other (See Comments)    Chest pressure, SOB, "pain in between shoulder blades", sweaty -as reported by patient per experience in ED this afternoon  . Avelox [Moxifloxacin Hcl In Nacl] Palpitations    Caused Heart Attack   . Oxycodone-Acetaminophen Other (See  Comments)    Says it makes her feel weird  . Risedronate Other (See Comments)    Chest pain    Past Medical History:  Diagnosis Date  . 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 Asthma   . 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  . Blood in stool    last week of aug 2018  . Carotid artery occlusion   . Complication of anesthesia   . COPD (chronic obstructive pulmonary disease) ([place])   . Coronary artery disease   . 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 Diverticulitis   . Dyspnea    with exertion  . Gastroesophageal reflux disease    occ  . Glaucoma    right eye  . Headache    tension  . Headache syndrome 11/27/2018  . Hiatal hernia   . Hyperlipidemia   . Hypertension   . Iliac artery aneurysm ([place])   . 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 Myocardial infarction [facility]) 01/01/2000   Cardiac catheterization  . Peripheral vascular disease ([place])    stents in legs x 2 or 3  . Pneumonia    last time winter 2017 -2018  . PONV (postoperative nausea and vomiting)    occassionally, last colonscopy did ok with anesthesia  . Reflux     Past Surgical History:  Procedure Laterality Date  . ABDOMINAL HYSTERECTOMY    . APPENDECTOMY    . Arnold-chiari malformation repair  1998   Suboccipital craniectomy  . CAROTID ENDARTERECTOMY  03/29/2010   Left  CEA  . CHOLECYSTECTOMY     Gall Bladder  . COLONOSCOPY WITH PROPOFOL N/A 04/22/2015   Procedure: COLONOSCOPY WITH PROPOFOL;  Surgeon: Carol Ada, MD;  [facility];  Service: Endoscopy;  Laterality: N/A;  . COLONOSCOPY WITH PROPOFOL N/A 05/25/2016   Procedure: COLONOSCOPY WITH PROPOFOL;  Surgeon: Carol Ada, MD;  [facility];  Service: Endoscopy;  Laterality: N/A;  . COLONOSCOPY WITH PROPOFOL N/A 05/03/2017   Procedure: COLONOSCOPY WITH PROPOFOL;  Surgeon: Carol Ada, MD;  [facility];  Service: Endoscopy;  Laterality: N/A;  . CORNEAL TRANSPLANT     Right  . CORONARY ARTERY BYPASS GRAFT  01/01/2000   x 3  . ENTEROSCOPY N/A 02/27/2018   Procedure: ENTEROSCOPY;  Surgeon: Carol Ada, MD;  [facility];  Service: Endoscopy;  Laterality: N/A;  . ENTEROSCOPY N/A 07/18/2018   Procedure: ENTEROSCOPY;  Surgeon: Carol Ada, MD;  [facility];  Service: Endoscopy;  Laterality: N/A;  . ESOPHAGOGASTRODUODENOSCOPY N/A 05/25/2016   Procedure: ESOPHAGOGASTRODUODENOSCOPY (EGD);  Surgeon: Carol Ada, MD;  Location: [facility];  Service: Endoscopy;  Laterality: N/A;  . ESOPHAGOGASTRODUODENOSCOPY N/A 08/01/2018   Procedure: ESOPHAGOGASTRODUODENOSCOPY (EGD);  Surgeon: Carol Ada, MD;  Location: [facility];  Service: Endoscopy;  Laterality: N/A;  . EYE SURGERY Right 1995 or 1996   Laser surgery for retinal hemorrhage  . GIVENS CAPSULE STUDY N/A 07/16/2018  Procedure: GIVENS CAPSULE STUDY;  Surgeon: Carol Ada, MD;  [facility];  Service: Endoscopy;  Laterality: N/A;  . HOT HEMOSTASIS N/A 02/27/2018   Procedure: HOT HEMOSTASIS (ARGON PLASMA COAGULATION/BICAP);  Surgeon: Carol Ada, MD;  Location: [facility];  Service: Endoscopy;  Laterality: N/A;  . HOT HEMOSTASIS N/A 08/01/2018   Procedure: HOT HEMOSTASIS (ARGON PLASMA COAGULATION/BICAP);  Surgeon: Carol Ada, MD;  Location: [facility];  Service: Endoscopy;  Laterality: N/A;  . IR RADIOLOGIST EVAL & MGMT  12/14/2016  . LEFT HEART CATH AND CORS/GRAFTS ANGIOGRAPHY N/A 01/09/2018   Procedure: LEFT HEART CATH AND CORS/GRAFTS ANGIOGRAPHY;  Surgeon: Charolette Forward, MD;  Location: [place];  Service: Cardiovascular;  Laterality: N/A;  . LEFT HEART CATHETERIZATION WITH CORONARY ANGIOGRAM N/A 08/03/2014   Procedure: LEFT HEART CATHETERIZATION WITH CORONARY ANGIOGRAM;  Surgeon: Birdie Riddle, MD;  Location: [facility];  Service: Cardiovascular;  Laterality: N/A;  . Post  Coronary Artery  BPG  01/05/2000   Right jugular sheath removed  . PR VEIN BYPASS GRAFT,AORTO-FEM-POP    . ROTATOR CUFF REPAIR     Right    Family History  Problem Relation Age of Onset  . Heart disease Mother        Heart Disease before age 65  . Hypertension Mother   . Hyperlipidemia Mother   . Heart attack Mother   . Clotting disorder Mother   . Heart disease Father        Heart Disease before age 77  . Heart attack Father   . Hyperlipidemia Father   . Hypertension Father   . Heart disease Brother        Heart Disease before age 74  . Hyperlipidemia Brother   . Hypertension Brother   . Clotting disorder Brother   . AAA (abdominal aortic aneurysm) Brother   . Cerebral aneurysm Sister   . Hypertension Sister   . AAA (abdominal aortic aneurysm) Sister   . Asthma Sister   . Cerebral aneurysm Brother   . Cancer Brother        Lung  . Hypertension Brother   . Heart attack Brother   . Heart disease Brother        Aneurysm of Brain  . Hypertension Brother   . Heart disease Brother   . Heart disease Brother   . Stroke Son        Aneurysm of Stomach  . AAA (abdominal aortic aneurysm) Son   . Cancer Maternal Uncle        great uncle/cancer/type unknown    Social History   Socioeconomic History  . Marital status: Widowed    Spouse name: Not on file  . Number of children: 4  . Years of education: 7TH  . Highest education level: Not on file  Occupational History  . Not on file  Tobacco Use  . Smoking status: Former Smoker    Packs/day: 1.50    Years: 30.00    Pack years: 45.00    Types: Cigarettes    Quit date: 08/13/2000    Years since quitting: 19.2  . Smokeless tobacco: Never Used  Substance and Sexual Activity  . Alcohol use: No    Alcohol/week: 0.0 standard drinks  . Drug use: No  . Sexual activity: Never  Other Topics Concern  . Not on file  Social History Narrative   Lives at home w/ her son   Right-handed   Drinks coffee and [employer]eps[employer] daily   Social  Determinants  of Health   Financial Resource Strain:   . Difficulty of Paying Living Expenses:   Food Insecurity:   . Worried About Charity fundraiser in the Last Year:   . Arboriculturist in the Last Year:   Transportation Needs:   . Film/video editor (Medical):   Marland Kitchen Lack of Transportation (Non-Medical):   Physical Activity:   . Days of Exercise per Week:   . Minutes of Exercise per Session:   Stress:   . Feeling of Stress :   Social Connections:   . Frequency of Communication with Friends and Family:   . Frequency of Social Gatherings with Friends and Family:   . Attends Religious Services:   . Active Member of Clubs or Organizations:   . Attends Archivist Meetings:   Marland Kitchen Marital Status:   Intimate Partner Violence:   . Fear of Current or Ex-Partner:   . Emotionally Abused:   Marland Kitchen Physically Abused:   . Sexually Abused:     Past Medical History, Surgical history, Social history, and Family history were reviewed and updated as appropriate.   Please see review of systems for further details on the patient's review from today.   Review of Systems:  Review of Systems  Constitutional: Positive for fatigue. Negative for chills, diaphoresis and fever.  HENT: Negative for trouble swallowing.   Respiratory: Positive for shortness of breath. Negative for cough, choking, chest tightness, wheezing and stridor.   Cardiovascular: Negative for chest pain and palpitations.  Gastrointestinal: Negative for anal bleeding, blood in stool, constipation, diarrhea, nausea and vomiting.  Genitourinary: Negative for hematuria.  Skin: Positive for pallor.  Neurological: Positive for weakness.    Objective:   Physical Exam:  There were no vitals taken for this visit. ECOG: 0  Physical Exam Constitutional:      General: She is not in acute distress.    Appearance: She is not diaphoretic.  HENT:     Head: Normocephalic and atraumatic.  Cardiovascular:     Rate and Rhythm:  Normal rate and regular rhythm.     Heart sounds: Normal heart sounds. No murmur. No friction rub. No gallop.   Pulmonary:     Effort: Pulmonary effort is normal. No respiratory distress.     Breath sounds: Examination of the right-lower field reveals wheezing. Examination of the left-lower field reveals wheezing. Wheezing present. No rales.  Skin:    General: Skin is warm and dry.     Findings: No erythema or rash.  Neurological:     Mental Status: She is alert.     Coordination: Coordination normal.     Gait: Gait normal.  Psychiatric:        Mood and Affect: Mood normal.        Behavior: Behavior normal.        Thought Content: Thought content normal.        Judgment: Judgment normal.     Lab Review:     Component Value Date/Time   NA 143 02/23/2019 0722   NA 139 08/02/2017 0821   K 4.4 02/23/2019 0722   K 4.3 08/02/2017 0821   CL 106 02/23/2019 0722   CO2 25 02/23/2019 0722   CO2 24 08/02/2017 0821   GLUCOSE 128 (H) 02/23/2019 0722   GLUCOSE 100 08/02/2017 0821   BUN 22 02/23/2019 0722   BUN 14.4 08/02/2017 0821   CREATININE 1.50 (H) 02/23/2019 0722   CREATININE 1.3 (H) 08/02/2017 0821   CALCIUM  9.9 02/23/2019 0722   CALCIUM 9.2 08/02/2017 0821   PROT 8.2 (H) 02/23/2019 0722   PROT 7.1 08/02/2017 0821   ALBUMIN 3.9 02/23/2019 0722   ALBUMIN 3.5 08/02/2017 0821   AST 19 02/23/2019 0722   AST 14 08/02/2017 0821   ALT 13 02/23/2019 0722   ALT 8 08/02/2017 0821   ALKPHOS 77 02/23/2019 0722   ALKPHOS 66 08/02/2017 0821   BILITOT 0.3 02/23/2019 0722   BILITOT 0.34 08/02/2017 0821   GFRNONAA 33 (L) 02/23/2019 0722   GFRAA 39 (L) 02/23/2019 0722       Component Value Date/Time   WBC 8.4 02/23/2019 0722   WBC 6.7 02/12/2019 0403   RBC 3.58 (L) 02/23/2019 0722   HGB 11.0 (L) 02/23/2019 0722   HGB 9.9 (L) 08/02/2017 0821   HCT 33.7 (L) 02/23/2019 0722   HCT 29.8 (L) 08/02/2017 0821   PLT 314 02/23/2019 0722   PLT 218 08/02/2017 0821   MCV 94.1 02/23/2019 0722    MCV 92.3 08/02/2017 0821   MCH 30.7 02/23/2019 0722   MCHC 32.6 02/23/2019 0722   RDW 12.7 02/23/2019 0722   RDW 14.4 08/02/2017 0821   LYMPHSABS 2.3 02/23/2019 0722   LYMPHSABS 2.6 08/02/2017 0821   MONOABS 0.6 02/23/2019 0722   MONOABS 0.5 08/02/2017 0821   EOSABS 0.3 02/23/2019 0722   EOSABS 0.2 08/02/2017 0821   BASOSABS 0.0 02/23/2019 0722   BASOSABS 0.0 08/02/2017 0821   -------------------------------  Imaging from last 24 hours (if applicable):  Radiology interpretation: No results found.

## 2019-11-11 LAB — IRON AND TIBC
Iron: 56 ug/dL (ref 41–142)
Saturation Ratios: 16 % — ABNORMAL LOW (ref 21–57)
TIBC: 349 ug/dL (ref 236–444)
UIBC: 293 ug/dL (ref 120–384)

## 2019-11-11 LAB — SAMPLE TO BLOOD BANK

## 2019-11-11 LAB — HAPTOGLOBIN: Haptoglobin: 294 mg/dL (ref 42–346)

## 2019-11-11 LAB — FERRITIN: Ferritin: 20 ng/mL (ref 11–307)

## 2019-11-16 ENCOUNTER — Telehealth: Payer: Self-pay | Admitting: Pulmonary Disease

## 2019-11-16 NOTE — Telephone Encounter (Signed)
Televisit with Aaron Edelman for tomorrow at 2:30

## 2019-11-16 NOTE — Telephone Encounter (Signed)
Spoke with the pt  She states that she was seen by oncology and had labs done 11/10/19  She states that her oncologist rec that Dr Valeta Harms review these labs b/c they are abnormal  She states having increased SOB with and without exertion over the past 2 wks  She also had cxr done 3/30 and was put on pred taper and augmentin 875 and this has not helped at all  She has minimal dry cough, no wheezing or chest tightness  She states her sats are normal averaging 96% ra  Her results are in Epic  Please advise thanks

## 2019-11-16 NOTE — Telephone Encounter (Signed)
Can put on a televisit today with Natalie Napoleon NP if she has anything or tomorrow with Warner Mccreedy NP to discuss in detail and decide on next step .   Please contact office for sooner follow up if symptoms do not improve or worsen or seek emergency care

## 2019-11-17 ENCOUNTER — Ambulatory Visit (INDEPENDENT_AMBULATORY_CARE_PROVIDER_SITE_OTHER): Payer: Medicare Other | Admitting: Pulmonary Disease

## 2019-11-17 ENCOUNTER — Encounter: Payer: Self-pay | Admitting: Pulmonary Disease

## 2019-11-17 ENCOUNTER — Other Ambulatory Visit: Payer: Self-pay

## 2019-11-17 VITALS — BP 120/50 | HR 70 | Temp 98.0°F | Ht 62.0 in | Wt 164.2 lb

## 2019-11-17 DIAGNOSIS — N183 Chronic kidney disease, stage 3 unspecified: Secondary | ICD-10-CM

## 2019-11-17 DIAGNOSIS — J449 Chronic obstructive pulmonary disease, unspecified: Secondary | ICD-10-CM

## 2019-11-17 LAB — BASIC METABOLIC PANEL
BUN: 36 mg/dL — ABNORMAL HIGH (ref 6–23)
CO2: 26 mEq/L (ref 19–32)
Calcium: 9.2 mg/dL (ref 8.4–10.5)
Chloride: 103 mEq/L (ref 96–112)
Creatinine, Ser: 1.63 mg/dL — ABNORMAL HIGH (ref 0.40–1.20)
GFR: 30.61 mL/min — ABNORMAL LOW (ref >60.00)
Glucose, Bld: 101 mg/dL — ABNORMAL HIGH (ref 70–99)
Potassium: 4.3 mEq/L (ref 3.5–5.1)
Sodium: 137 mEq/L (ref 135–145)

## 2019-11-17 MED ORDER — TRELEGY ELLIPTA 200-62.5-25 MCG/INH IN AEPB: 1.0000 | INHALATION_SPRAY | Freq: Every day | RESPIRATORY_TRACT | 6 refills | Status: DC

## 2019-11-17 MED ORDER — TRELEGY ELLIPTA 200-62.5-25 MCG/INH IN AEPB: 1.0000 | INHALATION_SPRAY | Freq: Every day | RESPIRATORY_TRACT | 0 refills | Status: DC

## 2019-11-17 NOTE — Progress Notes (Signed)
Synopsis: Referred in January 2020 to establish care, former patient of Dr. Lenna Gilford   Subjective:   PATIENT ID: [name] GENDER: female DOB: 1941-07-04, MRN: [id number]  Chief Complaint  Patient presents with  . Follow-up    sob with exertion.     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  She is followed with Dr. Earlie Server and Dr. Tammi Klippel.  In the oncology services.  She has an appointment with him tomorrow.  She had a recent CT scan completed yesterday which reveals right lower lobe groundglass opacities and scarring after stereotactic radiation to the right lower lobe.  She also has a small 7 mm central nodular location that will need to be followed.  She had PFTs completed today prior to her office visit.  The pulmonary function test revealed moderate COPD with an FEV1 65% postbronchodilator reduced ratio of 66, significant bronchodilator response with greater than 12% improvement, as well as a reduced DLCO 55% predicted.  Patient states today that she quit using her Anora inhaler several months ago because she did not know if it was making much difference in her breathing symptoms.  Before that she liked being on it.  There was some confusion regarding stopping blood pressure medication Azor versus stopping her Anoro.  She may have misunderstood that that information regarding changing of medications.  However she does have a stock supply of an oral at home that she could restart.  She did think it made a little bit of difference but was not sure.  At baseline she is dyspneic on exertion but she does not have any significant cough or daily sputum production.  OV 02/19/2019: recently in the hospital for TIA symptoms. No stroke. Now on plavix, taken off ASA. Followed planned with Dr. Julien Nordmann from oncology on 15th. Doing well since hospitalization. Concerned about her o2 levels. They  were low a few times in there hospital. Quit smoking in 2001.   OV 09/09/2019: Patient seen today for follow-up regarding her COPD.  Her symptoms are stable.  She denies chest pain nausea vomiting diarrhea.  She does have some shortness of breath with exertion.  She lives at home.  She is able to complete her activities of daily living.  She does have questions today regarding the Covid vaccine.  Patient was counseled on Covid vaccination and encouraged to obtain this next available opportunity.  OV 11/17/2019: Patient seen today for COPD and anemia. She has had worsening shortness of breath, productive cough and wheezing and was treated by her Oncologist for exacerbation with steroids and Augmentin. She did not feel like this had any effect. Her wheezing has resolved but her dyspnea has persisted even at rest. Her productive cough is at baseline. No fevers or chills.   Past Medical History:  Diagnosis Date  . 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 Asthma   . 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  . Blood in stool    last week of aug 2018  . Carotid artery occlusion   . Complication of anesthesia   . COPD (chronic obstructive pulmonary disease) ([place])   . Coronary artery disease   . 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 Diverticulitis   . Dyspnea    with exertion  . Gastroesophageal reflux disease    occ  .  Glaucoma    right eye  . Headache    tension  . Headache syndrome 11/27/2018  . Hiatal hernia   . Hyperlipidemia   . Hypertension   . Iliac artery aneurysm ([place])   . Lung cancer ([place]) dx 2018   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  . Myocardial infarction [facility]) 01/01/2000   Cardiac catheterization  . Peripheral vascular disease ([place])    stents in legs x 2 or 3  . Pneumonia    last time winter 2017 -2018  . PONV (postoperative nausea and vomiting)    occassionally, last  colonscopy did ok with anesthesia  . Reflux      Family History  Problem Relation Age of Onset  . Heart disease Mother        Heart Disease before age 47  . Hypertension Mother   . Hyperlipidemia Mother   . Heart attack Mother   . Clotting disorder Mother   . Heart disease Father        Heart Disease before age 17  . Heart attack Father   . Hyperlipidemia Father   . Hypertension Father   . Heart disease Brother        Heart Disease before age 31  . Hyperlipidemia Brother   . Hypertension Brother   . Clotting disorder Brother   . AAA (abdominal aortic aneurysm) Brother   . Cerebral aneurysm Sister   . Hypertension Sister   . AAA (abdominal aortic aneurysm) Sister   . Asthma Sister   . Cerebral aneurysm Brother   . Cancer Brother        Lung  . Hypertension Brother   . Heart attack Brother   . Heart disease Brother        Aneurysm of Brain  . Hypertension Brother   . Heart disease Brother   . Heart disease Brother   . Stroke Son        Aneurysm of Stomach  . AAA (abdominal aortic aneurysm) Son   . Cancer Maternal Uncle        great uncle/cancer/type unknown     Past Surgical History:  Procedure Laterality Date  . ABDOMINAL HYSTERECTOMY    . APPENDECTOMY    . Arnold-chiari malformation repair  1998   Suboccipital craniectomy  . CAROTID ENDARTERECTOMY  03/29/2010   Left  CEA  . CHOLECYSTECTOMY     Gall Bladder  . COLONOSCOPY WITH PROPOFOL N/A 04/22/2015   Procedure: COLONOSCOPY WITH PROPOFOL;  Surgeon: Carol Ada, MD;  Location: [facility];  Service: Endoscopy;  Laterality: N/A;  . COLONOSCOPY WITH PROPOFOL N/A 05/25/2016   Procedure: COLONOSCOPY WITH PROPOFOL;  Surgeon: Carol Ada, MD;  Location: [facility];  Service: Endoscopy;  Laterality: N/A;  . COLONOSCOPY WITH PROPOFOL N/A 05/03/2017   Procedure: COLONOSCOPY WITH PROPOFOL;  Surgeon: Carol Ada, MD;  Location: [facility];  Service: Endoscopy;  Laterality: N/A;  . CORNEAL TRANSPLANT     Right  .  CORONARY ARTERY BYPASS GRAFT  01/01/2000   x 3  . ENTEROSCOPY N/A 02/27/2018   Procedure: ENTEROSCOPY;  Surgeon: Carol Ada, MD;  Location: [facility];  Service: Endoscopy;  Laterality: N/A;  . ENTEROSCOPY N/A 07/18/2018   Procedure: ENTEROSCOPY;  Surgeon: Carol Ada, MD;  Location: [facility];  Service: Endoscopy;  Laterality: N/A;  . ESOPHAGOGASTRODUODENOSCOPY N/A 05/25/2016   Procedure: ESOPHAGOGASTRODUODENOSCOPY (EGD);  Surgeon: Carol Ada, MD;  Location: [facility];  Service: Endoscopy;  Laterality: N/A;  . ESOPHAGOGASTRODUODENOSCOPY N/A 08/01/2018  Procedure: ESOPHAGOGASTRODUODENOSCOPY (EGD);  Surgeon: Carol Ada, MD;  Location: [facility];  Service: Endoscopy;  Laterality: N/A;  . EYE SURGERY Right 1995 or 1996   Laser surgery for retinal hemorrhage  . GIVENS CAPSULE STUDY N/A 07/16/2018   Procedure: GIVENS CAPSULE STUDY;  Surgeon: Carol Ada, MD;  Location: [facility];  Service: Endoscopy;  Laterality: N/A;  . HOT HEMOSTASIS N/A 02/27/2018   Procedure: HOT HEMOSTASIS (ARGON PLASMA COAGULATION/BICAP);  Surgeon: Carol Ada, MD;  Location: [facility];  Service: Endoscopy;  Laterality: N/A;  . HOT HEMOSTASIS N/A 08/01/2018   Procedure: HOT HEMOSTASIS (ARGON PLASMA COAGULATION/BICAP);  Surgeon: Carol Ada, MD;  Location: [facility];  Service: Endoscopy;  Laterality: N/A;  . IR RADIOLOGIST EVAL & MGMT  12/14/2016  . LEFT HEART CATH AND CORS/GRAFTS ANGIOGRAPHY N/A 01/09/2018   Procedure: LEFT HEART CATH AND CORS/GRAFTS ANGIOGRAPHY;  Surgeon: Charolette Forward, MD;  Location: [facility];  Service: Cardiovascular;  Laterality: N/A;  . LEFT HEART CATHETERIZATION WITH CORONARY ANGIOGRAM N/A 08/03/2014   Procedure: LEFT HEART CATHETERIZATION WITH CORONARY ANGIOGRAM;  Surgeon: Birdie Riddle, MD;  Location: [facility];  Service: Cardiovascular;  Laterality: N/A;  . Post Coronary Artery  BPG  01/05/2000   Right jugular sheath removed  . PR VEIN BYPASS  GRAFT,AORTO-FEM-POP    . ROTATOR CUFF REPAIR     Right    Social History   Socioeconomic History  . Marital status: Widowed    Spouse name: Not on file  . Number of children: 4  . Years of education: 7TH  . Highest education level: Not on file  Occupational History  . Not on file  Tobacco Use  . Smoking status: Former Smoker    Packs/day: 1.50    Years: 30.00    Pack years: 45.00    Types: Cigarettes    Quit date: 08/13/2000    Years since quitting: 19.2  . Smokeless tobacco: Never Used  Substance and Sexual Activity  . Alcohol use: No    Alcohol/week: 0.0 standard drinks  . Drug use: No  . Sexual activity: Never  Other Topics Concern  . Not on file  Social History Narrative   Lives at home w/ her son   Right-handed   Drinks coffee and [employer]eps[employer] daily   Social Determinants of Health   Financial Resource Strain:   . Difficulty of Paying Living Expenses:   Food Insecurity:   . Worried About Charity fundraiser in the Last Year:   . Arboriculturist in the Last Year:   Transportation Needs:   . Film/video editor (Medical):   Marland Kitchen Lack of Transportation (Non-Medical):   Physical Activity:   . Days of Exercise per Week:   . Minutes of Exercise per Session:   Stress:   . Feeling of Stress :   Social Connections:   . Frequency of Communication with Friends and Family:   . Frequency of Social Gatherings with Friends and Family:   . Attends Religious Services:   . Active Member of Clubs or Organizations:   . Attends Archivist Meetings:   Marland Kitchen Marital Status:   Intimate Partner Violence:   . Fear of Current or Ex-Partner:   . Emotionally Abused:   Marland Kitchen Physically Abused:   . Sexually Abused:      Allergies  Allergen Reactions  . Azithromycin Swelling    Patient reported past history of lip swelling  . Codeine Other (See Comments)    Dr.  Harwani advised patient not to take this medication  . Doxycycline Swelling    Mouth, lips, feet swelling  .  Hydromorphone Palpitations and Other (See Comments)    DILAUDID  -  Pt had a Heart Attack after taking Dilaudid.  . Levaquin [Levofloxacin] Other (See Comments)    Chest pressure, SOB, "pain in between shoulder blades", sweaty -as reported by patient per experience in ED this afternoon  . Avelox [Moxifloxacin Hcl In Nacl] Palpitations    Caused Heart Attack   . Oxycodone-Acetaminophen Other (See Comments)    Says it makes her feel weird  . Risedronate Other (See Comments)    Chest pain     Outpatient Medications Prior to Visit  Medication Sig Dispense Refill  . acetaminophen (TYLENOL) 325 MG tablet Take 650 mg by mouth every 6 (six) hours as needed for moderate pain or headache.     Marland Kitchen amLODipine-olmesartan (AZOR) 5-40 MG tablet Take 1 tablet by mouth daily.    Marland Kitchen amoxicillin-clavulanate (AUGMENTIN) 875-125 MG tablet Take 1 tablet by mouth 2 (two) times daily. 14 tablet 0  . cetirizine (ZYRTEC) 10 MG tablet Take 10 mg by mouth as needed for allergies.    Marland Kitchen clopidogrel (PLAVIX) 75 MG tablet Take 1 tablet (75 mg total) by mouth daily. 90 tablet 3  . dexlansoprazole (DEXILANT) 60 MG capsule Take 1 capsule (60 mg total) by mouth daily before breakfast. 30 capsule 11  . ferrous sulfate 325 (65 FE) MG tablet Take 1 tablet (325 mg total) by mouth daily with breakfast. 30 tablet 0  . Fluticasone-Umeclidin-Vilant (TRELEGY ELLIPTA) 100-62.5-25 MCG/INH AEPB Inhale 1 puff into the lungs daily. 60 each 6  . hydrOXYzine (ATARAX/VISTARIL) 10 MG tablet Take by mouth.    . nitroGLYCERIN (NITROSTAT) 0.4 MG SL tablet Place 0.4 mg under the tongue every 5 (five) minutes as needed for chest pain.     . predniSONE (DELTASONE) 5 MG tablet 6 tab x 1 day, 5 tab x 1 day, 4 tab x 1 day, 3 tab x 1 day, 2 tab x 1 day, 1 tab x 1 day, stop 21 tablet 0  . rosuvastatin (CRESTOR) 10 MG tablet Take 10 mg by mouth at bedtime.     Marland Kitchen spironolactone (ALDACTONE) 25 MG tablet Take 25 mg by mouth daily.    . valACYclovir (VALTREX)  1000 MG tablet Take by mouth.     Facility-Administered Medications Prior to Visit  Medication Dose Route Frequency Provider Last Rate Last Admin  . promethazine (PHENERGAN) injection 6.25-12.5 mg  6.25-12.5 mg Intravenous Q15 min PRN Brennan Bailey, MD        Review of Systems  Constitutional: Negative for chills, diaphoresis, fever, malaise/fatigue and weight loss.  HENT: Negative for congestion, ear pain and sore throat.   Respiratory: Positive for cough, sputum production and shortness of breath. Negative for hemoptysis and wheezing.   Cardiovascular: Negative for chest pain, palpitations and leg swelling.  Gastrointestinal: Negative for abdominal pain, heartburn and nausea.  Genitourinary: Negative for frequency.  Musculoskeletal: Negative for joint pain and myalgias.  Skin: Negative for itching and rash.  Neurological: Negative for dizziness, weakness and headaches.  Endo/Heme/Allergies: Does not bruise/bleed easily.  Psychiatric/Behavioral: Negative for depression. The patient is not nervous/anxious.      Objective:  Physical Exam Vitals reviewed.  Constitutional:      General: She is not in acute distress.    Appearance: She is well-developed. She is not diaphoretic.  HENT:     Head:  Normocephalic and atraumatic.     Nose: Nose normal.     Mouth/Throat:     Pharynx: No oropharyngeal exudate.  Eyes:     General: No scleral icterus.    Conjunctiva/sclera: Conjunctivae normal.     Pupils: Pupils are equal, round, and reactive to light.  Neck:     Vascular: No JVD.     Trachea: No tracheal deviation.  Cardiovascular:     Rate and Rhythm: Normal rate and regular rhythm.     Heart sounds: Normal heart sounds. No murmur. No friction rub. No gallop.   Pulmonary:     Effort: Pulmonary effort is normal. No respiratory distress.     Breath sounds: Normal breath sounds. No stridor. No wheezing or rales.  Abdominal:     General: Bowel sounds are normal. There is no  distension.     Palpations: Abdomen is soft.     Tenderness: There is no abdominal tenderness.  Musculoskeletal:        General: No deformity. Normal range of motion.     Cervical back: Normal range of motion and neck supple.  Lymphadenopathy:     Cervical: No cervical adenopathy.  Skin:    General: Skin is warm and dry.     Coloration: Skin is not pale.     Findings: No erythema or rash.  Neurological:     Mental Status: She is alert and oriented to person, place, and time.     Cranial Nerves: No cranial nerve deficit.     Sensory: No sensory deficit.  Psychiatric:        Behavior: Behavior normal.        Thought Content: Thought content normal.        Judgment: Judgment normal.     Vitals:   11/17/19 1417  BP: (!) 120/50  Pulse: 70  Temp: 98 F (36.7 C)  TempSrc: Temporal  SpO2: 98%  Weight: 164 lb 3.2 oz (74.5 kg)  Height: 5\' 2"  (1.575 m)     on RA BMI Readings from Last 3 Encounters:  11/10/19 30.31 kg/m  09/09/19 30.40 kg/m  08/19/19 30.51 kg/m   Wt Readings from Last 3 Encounters:  11/10/19 165 lb 11.2 oz (75.2 kg)  09/09/19 166 lb 3.2 oz (75.4 kg)  08/19/19 166 lb 12.8 oz (75.7 kg)   Chest Imaging:  08/25/2018 CT Chest:  Imaging reviewed.  Right lower lobe areas of groundglass and scarring status post sterotactic radiation likely radiation-induced parenchymal scarring.  Small nodular density within this area 7 mm in size.  She also has a new associated/adjacent rib fracture within that right side which may be concerning and would need to be followed for any recurrence of squamous cell disease. The patient's images have been independently reviewed by me.    02/23/2019 CT Chest: No new metastatic disease. Unchanged RLL radiation changes. Imaging reviewed independently by me.  Pulmonary Functions Testing Results: PFT Results Latest Ref Rng & Units 08/26/2018 02/14/2018 04/10/2016  FVC-Pre L 1.69 1.80 1.53  FVC-Predicted Pre % 65 69 57  FVC-Post L 1.91 1.99  1.82  FVC-Predicted Post % 74 77 69  Pre FEV1/FVC % % 67 72 71  Post FEV1/FCV % % 66 72 70  FEV1-Pre L 1.12 1.30 1.09  FEV1-Predicted Pre % 58 67 54  FEV1-Post L 1.26 1.43 1.28  DLCO UNC% % 55 - 51  DLCO COR %Predicted % 97 - 86  TLC L 3.85 - 3.77  TLC % Predicted % 81 -  79  RV % Predicted % 94 - 96     FeNO: None   Pathology:   Squamous cell carcinoma of the lung right lower lobe status post stereotactic radiation therapy  Echocardiogram: none recent   Heart Catheterization:   Jan 09, 2018: Dr. Nelta Numbers LAD to Prox LAD lesion is 80% stenosed.  Ost 1st Diag lesion is 100% stenosed.  Prox RCA lesion is 20% stenosed.  Mid RCA lesion is 30% stenosed.  Mid LM to Dist LM lesion is 40% stenosed.  Ost Ramus lesion is 40% stenosed.  Ost Cx lesion is 40% stenosed.  Origin lesion is 100% stenosed.  Origin to Prox Graft lesion is 100% stenosed.  The left ventricular systolic function is normal.  LV end diastolic pressure is normal.  The left ventricular ejection fraction is 50-55% by visual estimate.    Assessment & Plan:     ICD-10-CM   1. Chronic obstructive pulmonary disease, unspecified COPD type ([place])  J44.9    Assessment:   79 year old female with past medical history of stage II COPD, history of stage I squamous cell carcinoma s/p SBRT. After her recent COPD exacerbation, she continues to have dyspnea. Will increase her Trelegy ICS strength but I suspect she is deconditioned. We reviewed her labs and she does have anemia with borderline normal-low ferritin and iron. Recommended iron supplements.  Plan Following Extensive Data Review & Interpretation:  . I reviewed prior external note(s) from Dr. Valeta Harms, 09/09/19 Pulmonary   . I reviewed the result(s) of lab work from March 2021 including Hg, ferritin and iron . I have ordered BMP. If creatinine elevated, will send message to PCP to further work-up  Independent interpretation of tests . Review of  patient's CT chest July 2020 images revealed no evidence of metastatic disease evidence of radiation changes from previous right lower lobe nodule. The patient's images have been independently reviewed by me.    Return in about 3 months (around 02/16/2020) for with Dr. Valeta Harms.     Current Outpatient Medications:  .  acetaminophen (TYLENOL) 325 MG tablet, Take 650 mg by mouth every 6 (six) hours as needed for moderate pain or headache. , Disp: , Rfl:  .  amLODipine-olmesartan (AZOR) 5-40 MG tablet, Take 1 tablet by mouth daily., Disp: , Rfl:  .  amoxicillin-clavulanate (AUGMENTIN) 875-125 MG tablet, Take 1 tablet by mouth 2 (two) times daily., Disp: 14 tablet, Rfl: 0 .  cetirizine (ZYRTEC) 10 MG tablet, Take 10 mg by mouth as needed for allergies., Disp: , Rfl:  .  clopidogrel (PLAVIX) 75 MG tablet, Take 1 tablet (75 mg total) by mouth daily., Disp: 90 tablet, Rfl: 3 .  dexlansoprazole (DEXILANT) 60 MG capsule, Take 1 capsule (60 mg total) by mouth daily before breakfast., Disp: 30 capsule, Rfl: 11 .  ferrous sulfate 325 (65 FE) MG tablet, Take 1 tablet (325 mg total) by mouth daily with breakfast., Disp: 30 tablet, Rfl: 0 .  Fluticasone-Umeclidin-Vilant (TRELEGY ELLIPTA) 100-62.5-25 MCG/INH AEPB, Inhale 1 puff into the lungs daily., Disp: 60 each, Rfl: 6 .  hydrOXYzine (ATARAX/VISTARIL) 10 MG tablet, Take by mouth., Disp: , Rfl:  .  nitroGLYCERIN (NITROSTAT) 0.4 MG SL tablet, Place 0.4 mg under the tongue every 5 (five) minutes as needed for chest pain. , Disp: , Rfl:  .  predniSONE (DELTASONE) 5 MG tablet, 6 tab x 1 day, 5 tab x 1 day, 4 tab x 1 day, 3 tab x 1 day, 2 tab x  1 day, 1 tab x 1 day, stop, Disp: 21 tablet, Rfl: 0 .  rosuvastatin (CRESTOR) 10 MG tablet, Take 10 mg by mouth at bedtime. , Disp: , Rfl:  .  spironolactone (ALDACTONE) 25 MG tablet, Take 25 mg by mouth daily., Disp: , Rfl:  .  valACYclovir (VALTREX) 1000 MG tablet, Take by mouth., Disp: , Rfl:  No current  facility-administered medications for this visit.  Facility-Administered Medications Ordered in Other Visits:  .  promethazine (PHENERGAN) injection 6.25-12.5 mg, 6.25-12.5 mg, Intravenous, Q15 min PRN, Brennan Bailey, MD   Janaiya Beauchesne Rodman Pickle, MD [facility] 11/17/2019 2:12 PM

## 2019-11-17 NOTE — Progress Notes (Deleted)
Virtual Visit via Telephone Note  I connected with [name] on 11/17/19 at  2:30 PM EDT by telephone and verified that I am speaking with the correct person using two identifiers.  Location: Patient: Home Provider: Office [facility] - [address], [place], [place], [place] [address]   I discussed the limitations, risks, security and privacy concerns of performing an evaluation and management service by telephone and the availability of in person appointments. I also discussed with the patient that there may be a patient responsible charge related to this service. The patient expressed understanding and agreed to proceed.  Patient consented to consult via telephone: Yes People present and their role in pt care: Pt     History of Present Illness:     Chief complaint:     Observations/Objective:  Social History   Tobacco Use  Smoking Status Former Smoker  . Packs/day: 1.50  . Years: 30.00  . Pack years: 45.00  . Types: Cigarettes  . Quit date: 08/13/2000  . Years since quitting: 19.2  Smokeless Tobacco Never Used   Immunization History  Administered Date(s) Administered  . Fluad Quad(high Dose 65+) 04/07/2019  . Influenza Split 05/15/2012  . Influenza Whole 05/13/2012  . Influenza, High Dose Seasonal PF 05/22/2013, 07/13/2014, 05/13/2018  . Influenza,inj,Quad PF,6+ Mos 05/15/2012, 04/14/2015, 05/18/2015, 04/10/2016, 04/30/2017  . PFIZER SARS-COV-2 Vaccination 09/28/2019, 10/20/2019  . Pneumococcal Conjugate-13 05/15/2012  . Pneumococcal Polysaccharide-23 11/30/2014     Assessment and Plan:  No problem-specific Assessment & Plan notes found for this encounter.   Follow Up Instructions:  No follow-ups on file.   I discussed the assessment and treatment plan with the patient. The patient was provided an opportunity to ask questions and all were answered. The patient agreed with the plan and demonstrated an understanding of the instructions.    The patient was advised to call back or seek an in-person evaluation if the symptoms worsen or if the condition fails to improve as anticipated.  I provided *** minutes of non-face-to-face time during this encounter.   Lauraine Rinne, NP

## 2019-11-17 NOTE — Patient Instructions (Addendum)
COPD (FEV1 65%), not in acute exacerbation --INCREASE Trelegy ICS strength to 200. Take ONE puff ONCE a day  Chronic anemia --Recommend ferrous sulfate 325 mg daily (=65 Fe). If you wish to obtain liquid iron, please discuss over-the-counter options with your local pharmacist.   Follow-up in 3 months with Dr. Valeta Harms

## 2019-11-17 NOTE — Addendum Note (Signed)
Addended by: Vanessa Barbara on: 11/17/2019 04:53 PM   Modules accepted: Orders

## 2019-11-19 NOTE — Progress Notes (Signed)
Patient identification verified. Results of recent lab work provided. Per Dr. Loanne Drilling, renal function is improved compared to last week, results will be sent to the PCP. Patient verbalized understanding of results.

## 2019-11-24 ENCOUNTER — Telehealth: Payer: Self-pay | Admitting: Medical Oncology

## 2019-11-24 NOTE — Telephone Encounter (Signed)
Pt.notified

## 2019-11-24 NOTE — Telephone Encounter (Signed)
No need for iron infusion at this point.  Her numbers are normal.  We will see her back in July.

## 2019-11-24 NOTE — Telephone Encounter (Signed)
Iron infusions-PCP Referred her back to [facility] to see about getting iron infusions.  Labs done 3/30 and saw Lucianne Lei.   F/u expected July .

## 2019-12-23 ENCOUNTER — Encounter: Payer: Self-pay | Admitting: Pulmonary Disease

## 2019-12-23 ENCOUNTER — Other Ambulatory Visit: Payer: Self-pay

## 2019-12-23 ENCOUNTER — Ambulatory Visit (INDEPENDENT_AMBULATORY_CARE_PROVIDER_SITE_OTHER): Payer: Medicare Other | Admitting: Pulmonary Disease

## 2019-12-23 VITALS — BP 140/64 | HR 66 | Temp 98.0°F | Ht 62.0 in | Wt 164.6 lb

## 2019-12-23 DIAGNOSIS — Z923 Personal history of irradiation: Secondary | ICD-10-CM | POA: Diagnosis not present

## 2019-12-23 DIAGNOSIS — J449 Chronic obstructive pulmonary disease, unspecified: Secondary | ICD-10-CM | POA: Diagnosis not present

## 2019-12-23 DIAGNOSIS — C3431 Malignant neoplasm of lower lobe, right bronchus or lung: Secondary | ICD-10-CM | POA: Diagnosis not present

## 2019-12-23 NOTE — Patient Instructions (Addendum)
Thank you for visiting Dr. Valeta Harms at [facility]. Today we recommend the following:  Start breztri samples  Use nebulizer as needed for shortness of breath  Return in about 6 months (around 06/24/2020) for w/ Dr. Valeta Harms .    Please do your part to reduce the spread of COVID-19.

## 2019-12-23 NOTE — Progress Notes (Signed)
Synopsis: Referred in January 2020 to establish care, former patient of Dr. Lenna Gilford   Subjective:   PATIENT ID: [name] GENDER: female DOB: July 28, 1941, MRN: [id number]  Chief Complaint  Patient presents with  . 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  She is followed with Dr. Earlie Server and Dr. Tammi Klippel.  In the oncology services.  She has an appointment with him tomorrow.  She had a recent CT scan completed yesterday which reveals right lower lobe groundglass opacities and scarring after stereotactic radiation to the right lower lobe.  She also has a small 7 mm central nodular location that will need to be followed.  She had PFTs completed today prior to her office visit.  The pulmonary function test revealed moderate COPD with an FEV1 65% postbronchodilator reduced ratio of 66, significant bronchodilator response with greater than 12% improvement, as well as a reduced DLCO 55% predicted.  Patient states today that she quit using her Anora inhaler several months ago because she did not know if it was making much difference in her breathing symptoms.  Before that she liked being on it.  There was some confusion regarding stopping blood pressure medication Azor versus stopping her Anoro.  She may have misunderstood that that information regarding changing of medications.  However she does have a stock supply of an oral at home that she could restart.  She did think it made a little bit of difference but was not sure.  At baseline she is dyspneic on exertion but she does not have any significant cough or daily sputum production.  OV 02/19/2019: recently in the hospital for TIA symptoms. No stroke. Now on plavix, taken off ASA. Followed planned with Dr. Julien Nordmann from oncology on 15th. Doing well since hospitalization. Concerned about her o2 levels. They were low a few times in  there hospital. Quit smoking in 2001.   OV 09/09/2019: Patient seen today for follow-up regarding her COPD.  Her symptoms are stable.  She denies chest pain nausea vomiting diarrhea.  She does have some shortness of breath with exertion.  She lives at home.  She is able to complete her activities of daily living.  She does have questions today regarding the Covid vaccine.  Patient was counseled on Covid vaccination and encouraged to obtain this next available opportunity.  OV 12/23/2019: Here today for follow-up regarding COPD.  Patient recently seen by Dr. Loanne Drilling for exacerbation and shortness of breath increasing shortness of breath.  She had inhaler change.  At this time doing well with the inhaler but still has some shortness of breath with significant exertion.  She wanted know if there is a different medication that may go to change to.  Denies weight loss denies hemoptysis.  She has follow-up scheduled with Dr. Earlie Server in July with a repeat CT scan.    Past Medical History:  Diagnosis Date  . 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 Asthma   . 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  . Blood in stool    last week of aug 2018  . Carotid artery occlusion   . Complication of anesthesia   . COPD (chronic obstructive pulmonary disease) ([place])   . Coronary artery disease   . 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 Diverticulitis   .  Dyspnea    with exertion  . Gastroesophageal reflux disease    occ  . Glaucoma    right eye  . Headache    tension  . Headache syndrome 11/27/2018  . Hiatal hernia   . Hyperlipidemia   . Hypertension   . Iliac artery aneurysm ([place])   . Lung cancer ([place]) dx 2018   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  . Myocardial infarction [facility]) 01/01/2000   Cardiac catheterization  . Peripheral vascular disease ([place])    stents in legs x 2 or 3  . Pneumonia    last  time winter 2017 -2018  . PONV (postoperative nausea and vomiting)    occassionally, last colonscopy did ok with anesthesia  . Reflux      Family History  Problem Relation Age of Onset  . Heart disease Mother        Heart Disease before age 76  . Hypertension Mother   . Hyperlipidemia Mother   . Heart attack Mother   . Clotting disorder Mother   . Heart disease Father        Heart Disease before age 68  . Heart attack Father   . Hyperlipidemia Father   . Hypertension Father   . Heart disease Brother        Heart Disease before age 19  . Hyperlipidemia Brother   . Hypertension Brother   . Clotting disorder Brother   . AAA (abdominal aortic aneurysm) Brother   . Cerebral aneurysm Sister   . Hypertension Sister   . AAA (abdominal aortic aneurysm) Sister   . Asthma Sister   . Cerebral aneurysm Brother   . Cancer Brother        Lung  . Hypertension Brother   . Heart attack Brother   . Heart disease Brother        Aneurysm of Brain  . Hypertension Brother   . Heart disease Brother   . Heart disease Brother   . Stroke Son        Aneurysm of Stomach  . AAA (abdominal aortic aneurysm) Son   . Cancer Maternal Uncle        great uncle/cancer/type unknown     Past Surgical History:  Procedure Laterality Date  . ABDOMINAL HYSTERECTOMY    . APPENDECTOMY    . Arnold-chiari malformation repair  1998   Suboccipital craniectomy  . CAROTID ENDARTERECTOMY  03/29/2010   Left  CEA  . CHOLECYSTECTOMY     Gall Bladder  . COLONOSCOPY WITH PROPOFOL N/A 04/22/2015   Procedure: COLONOSCOPY WITH PROPOFOL;  Surgeon: Carol Ada, MD;  Location: [facility];  Service: Endoscopy;  Laterality: N/A;  . COLONOSCOPY WITH PROPOFOL N/A 05/25/2016   Procedure: COLONOSCOPY WITH PROPOFOL;  Surgeon: Carol Ada, MD;  Location: [facility];  Service: Endoscopy;  Laterality: N/A;  . COLONOSCOPY WITH PROPOFOL N/A 05/03/2017   Procedure: COLONOSCOPY WITH PROPOFOL;  Surgeon: Carol Ada, MD;   Location: [facility];  Service: Endoscopy;  Laterality: N/A;  . CORNEAL TRANSPLANT     Right  . CORONARY ARTERY BYPASS GRAFT  01/01/2000   x 3  . ENTEROSCOPY N/A 02/27/2018   Procedure: ENTEROSCOPY;  Surgeon: Carol Ada, MD;  Location: [facility];  Service: Endoscopy;  Laterality: N/A;  . ENTEROSCOPY N/A 07/18/2018   Procedure: ENTEROSCOPY;  Surgeon: Carol Ada, MD;  Location: [facility];  Service: Endoscopy;  Laterality: N/A;  . ESOPHAGOGASTRODUODENOSCOPY N/A 05/25/2016   Procedure: ESOPHAGOGASTRODUODENOSCOPY (EGD);  Surgeon: Carol Ada,  MD;  Location: [facility];  Service: Endoscopy;  Laterality: N/A;  . ESOPHAGOGASTRODUODENOSCOPY N/A 08/01/2018   Procedure: ESOPHAGOGASTRODUODENOSCOPY (EGD);  Surgeon: Carol Ada, MD;  Location: [facility];  Service: Endoscopy;  Laterality: N/A;  . EYE SURGERY Right 1995 or 1996   Laser surgery for retinal hemorrhage  . GIVENS CAPSULE STUDY N/A 07/16/2018   Procedure: GIVENS CAPSULE STUDY;  Surgeon: Carol Ada, MD;  Location: [facility];  Service: Endoscopy;  Laterality: N/A;  . HOT HEMOSTASIS N/A 02/27/2018   Procedure: HOT HEMOSTASIS (ARGON PLASMA COAGULATION/BICAP);  Surgeon: Carol Ada, MD;  Location: [facility];  Service: Endoscopy;  Laterality: N/A;  . HOT HEMOSTASIS N/A 08/01/2018   Procedure: HOT HEMOSTASIS (ARGON PLASMA COAGULATION/BICAP);  Surgeon: Carol Ada, MD;  Location: [facility];  Service: Endoscopy;  Laterality: N/A;  . IR RADIOLOGIST EVAL & MGMT  12/14/2016  . LEFT HEART CATH AND CORS/GRAFTS ANGIOGRAPHY N/A 01/09/2018   Procedure: LEFT HEART CATH AND CORS/GRAFTS ANGIOGRAPHY;  Surgeon: Charolette Forward, MD;  Location: [place] [facility];  Service: Cardiovascular;  Laterality: N/A;  . LEFT HEART CATHETERIZATION WITH CORONARY ANGIOGRAM N/A 08/03/2014   Procedure: LEFT HEART CATHETERIZATION WITH CORONARY ANGIOGRAM;  Surgeon: Birdie Riddle, MD;  Location: [place] [facility];  Service: Cardiovascular;  Laterality: N/A;  .  Post Coronary Artery  BPG  01/05/2000   Right jugular sheath removed  . PR VEIN BYPASS GRAFT,AORTO-FEM-POP    . ROTATOR CUFF REPAIR     Right    Social History   Socioeconomic History  . Marital status: Widowed    Spouse name: Not on file  . Number of children: 4  . Years of education: 7TH  . Highest education level: Not on file  Occupational History  . Not on file  Tobacco Use  . Smoking status: Former Smoker    Packs/day: 1.50    Years: 30.00    Pack years: 45.00    Types: Cigarettes    Quit date: 08/13/2000    Years since quitting: 19.3  . Smokeless tobacco: Never Used  Substance and Sexual Activity  . Alcohol use: No    Alcohol/week: 0.0 standard drinks  . Drug use: No  . Sexual activity: Never  Other Topics Concern  . Not on file  Social History Narrative   Lives at home w/ her son   Right-handed   Drinks coffee and [employer]eps[employer] daily   Social Determinants of Health   Financial Resource Strain:   . Difficulty of Paying Living Expenses:   Food Insecurity:   . Worried About Charity fundraiser in the Last Year:   . Arboriculturist in the Last Year:   Transportation Needs:   . Film/video editor (Medical):   Marland Kitchen Lack of Transportation (Non-Medical):   Physical Activity:   . Days of Exercise per Week:   . Minutes of Exercise per Session:   Stress:   . Feeling of Stress :   Social Connections:   . Frequency of Communication with Friends and Family:   . Frequency of Social Gatherings with Friends and Family:   . Attends Religious Services:   . Active Member of Clubs or Organizations:   . Attends Archivist Meetings:   Marland Kitchen Marital Status:   Intimate Partner Violence:   . Fear of Current or Ex-Partner:   . Emotionally Abused:   Marland Kitchen Physically Abused:   . Sexually Abused:      Allergies  Allergen Reactions  . Azithromycin Swelling  Patient reported past history of lip swelling  . Codeine Other (See Comments)    Dr. Terrence Dupont advised patient not to  take this medication  . Doxycycline Swelling    Mouth, lips, feet swelling  . Hydromorphone Palpitations and Other (See Comments)    DILAUDID  -  Pt had a Heart Attack after taking Dilaudid.  . Levaquin [Levofloxacin] Other (See Comments)    Chest pressure, SOB, "pain in between shoulder blades", sweaty -as reported by patient per experience in ED this afternoon  . Avelox [Moxifloxacin Hcl In Nacl] Palpitations    Caused Heart Attack   . Oxycodone-Acetaminophen Other (See Comments)    Says it makes her feel weird  . Risedronate Other (See Comments)    Chest pain     Outpatient Medications Prior to Visit  Medication Sig Dispense Refill  . acetaminophen (TYLENOL) 325 MG tablet Take 650 mg by mouth every 6 (six) hours as needed for moderate pain or headache.     Marland Kitchen amLODipine-olmesartan (AZOR) 5-40 MG tablet Take 1 tablet by mouth daily.    . cetirizine (ZYRTEC) 10 MG tablet Take 10 mg by mouth as needed for allergies.    Marland Kitchen clopidogrel (PLAVIX) 75 MG tablet Take 1 tablet (75 mg total) by mouth daily. 90 tablet 3  . dexlansoprazole (DEXILANT) 60 MG capsule Take 1 capsule (60 mg total) by mouth daily before breakfast. 30 capsule 11  . Fluticasone-Umeclidin-Vilant (TRELEGY ELLIPTA) 100-62.5-25 MCG/INH AEPB Inhale 1 puff into the lungs daily. 60 each 6  . Fluticasone-Umeclidin-Vilant (TRELEGY ELLIPTA) 200-62.5-25 MCG/INH AEPB Inhale 1 puff into the lungs daily. 60 each 6  . Fluticasone-Umeclidin-Vilant (TRELEGY ELLIPTA) 200-62.5-25 MCG/INH AEPB Inhale 1 puff into the lungs daily. 1 each 0  . hydrOXYzine (ATARAX/VISTARIL) 10 MG tablet Take by mouth.    . nitroGLYCERIN (NITROSTAT) 0.4 MG SL tablet Place 0.4 mg under the tongue every 5 (five) minutes as needed for chest pain.     . rosuvastatin (CRESTOR) 10 MG tablet Take 10 mg by mouth at bedtime.     Marland Kitchen spironolactone (ALDACTONE) 25 MG tablet Take 25 mg by mouth daily.    . valACYclovir (VALTREX) 1000 MG tablet Take by mouth.    . ferrous sulfate  325 (65 FE) MG tablet Take 1 tablet (325 mg total) by mouth daily with breakfast. 30 tablet 0   Facility-Administered Medications Prior to Visit  Medication Dose Route Frequency Provider Last Rate Last Admin  . promethazine (PHENERGAN) injection 6.25-12.5 mg  6.25-12.5 mg Intravenous Q15 min PRN Brennan Bailey, MD        Review of Systems  Constitutional: Negative for chills, fever, malaise/fatigue and weight loss.  HENT: Negative for hearing loss, sore throat and tinnitus.   Eyes: Negative for blurred vision and double vision.  Respiratory: Positive for shortness of breath. Negative for cough, hemoptysis, sputum production, wheezing and stridor.   Cardiovascular: Negative for chest pain, palpitations, orthopnea, leg swelling and PND.  Gastrointestinal: Negative for abdominal pain, constipation, diarrhea, heartburn, nausea and vomiting.  Genitourinary: Negative for dysuria, hematuria and urgency.  Musculoskeletal: Negative for joint pain and myalgias.  Skin: Negative for itching and rash.  Neurological: Negative for dizziness, tingling, weakness and headaches.  Endo/Heme/Allergies: Negative for environmental allergies. Does not bruise/bleed easily.  Psychiatric/Behavioral: Negative for depression. The patient is not nervous/anxious and does not have insomnia.   All other systems reviewed and are negative.    Objective:  Physical Exam Vitals reviewed.  Constitutional:  General: She is not in acute distress.    Appearance: She is well-developed.  HENT:     Head: Normocephalic and atraumatic.     Mouth/Throat:     Pharynx: No oropharyngeal exudate.  Eyes:     Conjunctiva/sclera: Conjunctivae normal.     Pupils: Pupils are equal, round, and reactive to light.  Neck:     Vascular: No JVD.     Trachea: No tracheal deviation.  Cardiovascular:     Rate and Rhythm: Normal rate and regular rhythm.     Heart sounds: S1 normal and S2 normal.     Comments: Distant heart tones  Pulmonary:     Effort: No tachypnea or accessory muscle usage.     Breath sounds: No stridor. Decreased breath sounds (throughout all lung fields) present. No wheezing, rhonchi or rales.  Abdominal:     General: Bowel sounds are normal. There is no distension.     Palpations: Abdomen is soft.     Tenderness: There is no abdominal tenderness.  Musculoskeletal:        General: Deformity (muscle wasting ) present.  Skin:    General: Skin is warm and dry.     Capillary Refill: Capillary refill takes less than 2 seconds.     Findings: No rash.  Neurological:     Mental Status: She is alert and oriented to person, place, and time.  Psychiatric:        Behavior: Behavior normal.     Vitals:   12/23/19 0936  BP: 140/64  Pulse: 66  Temp: 98 F (36.7 C)  TempSrc: Temporal  SpO2: 98%  Weight: 164 lb 9.6 oz (74.7 kg)  Height: 5\' 2"  (1.575 m)   98% on RA BMI Readings from Last 3 Encounters:  12/23/19 30.11 kg/m  11/17/19 30.03 kg/m  11/10/19 30.31 kg/m   Wt Readings from Last 3 Encounters:  12/23/19 164 lb 9.6 oz (74.7 kg)  11/17/19 164 lb 3.2 oz (74.5 kg)  11/10/19 165 lb 11.2 oz (75.2 kg)   Chest Imaging:  08/25/2018 CT Chest:  Imaging reviewed.  Right lower lobe areas of groundglass and scarring status post sterotactic radiation likely radiation-induced parenchymal scarring.  Small nodular density within this area 7 mm in size.  She also has a new associated/adjacent rib fracture within that right side which may be concerning and would need to be followed for any recurrence of squamous cell disease. The patient's images have been independently reviewed by me.    02/23/2019 CT Chest: Follow up scheduled for cancer eval and staging   Chest x-ray 11/10/2019: Chronic scarring in the bases.  Mildly hyperinflated lung fields consistent with COPD diagnosis. The patient's images have been independently reviewed by me.     Pulmonary Functions Testing Results: PFT Results  Latest Ref Rng & Units 08/26/2018 02/14/2018 04/10/2016  FVC-Pre L 1.69 1.80 1.53  FVC-Predicted Pre % 65 69 57  FVC-Post L 1.91 1.99 1.82  FVC-Predicted Post % 74 77 69  Pre FEV1/FVC % % 67 72 71  Post FEV1/FCV % % 66 72 70  FEV1-Pre L 1.12 1.30 1.09  FEV1-Predicted Pre % 58 67 54  FEV1-Post L 1.26 1.43 1.28  DLCO UNC% % 55 - 51  DLCO COR %Predicted % 97 - 86  TLC L 3.85 - 3.77  TLC % Predicted % 81 - 79  RV % Predicted % 94 - 96     FeNO: None   Pathology:   Squamous cell carcinoma of  the lung right lower lobe status post stereotactic radiation therapy  Echocardiogram: none recent   Heart Catheterization:   Jan 09, 2018: Dr. Nelta Numbers LAD to Prox LAD lesion is 80% stenosed.  Ost 1st Diag lesion is 100% stenosed.  Prox RCA lesion is 20% stenosed.  Mid RCA lesion is 30% stenosed.  Mid LM to Dist LM lesion is 40% stenosed.  Ost Ramus lesion is 40% stenosed.  Ost Cx lesion is 40% stenosed.  Origin lesion is 100% stenosed.  Origin to Prox Graft lesion is 100% stenosed.  The left ventricular systolic function is normal.  LV end diastolic pressure is normal.  The left ventricular ejection fraction is 50-55% by visual estimate.    Assessment & Plan:     ICD-10-CM   1. Stage 2 moderate COPD by GOLD classification ([place])  J44.9   2. Primary cancer of right lower lobe of lung (HCC)  C34.31   3. Hx of radiation therapy  Z92.3     Assessment:   79 year old female past medical history of stage II COPD, stage I squamous cell carcinoma of the lung status post SBRT.  At this time still having some dyspnea on exertion.  Managed with Trelegy.  She did have her Covid vaccine. Additional follow-up with medical oncology planned with CT imaging in July.  Independent interpretation of tests: 2 view chest x-ray March 2021 as above.  Plan: Continue current Trelegy inhaler. Refills as needed. Continue nebulizer therapy with albuterol as needed. Follow-up with [place] in 6  months for COPD management.    Current Outpatient Medications:  .  acetaminophen (TYLENOL) 325 MG tablet, Take 650 mg by mouth every 6 (six) hours as needed for moderate pain or headache. , Disp: , Rfl:  .  amLODipine-olmesartan (AZOR) 5-40 MG tablet, Take 1 tablet by mouth daily., Disp: , Rfl:  .  cetirizine (ZYRTEC) 10 MG tablet, Take 10 mg by mouth as needed for allergies., Disp: , Rfl:  .  clopidogrel (PLAVIX) 75 MG tablet, Take 1 tablet (75 mg total) by mouth daily., Disp: 90 tablet, Rfl: 3 .  dexlansoprazole (DEXILANT) 60 MG capsule, Take 1 capsule (60 mg total) by mouth daily before breakfast., Disp: 30 capsule, Rfl: 11 .  Fluticasone-Umeclidin-Vilant (TRELEGY ELLIPTA) 100-62.5-25 MCG/INH AEPB, Inhale 1 puff into the lungs daily., Disp: 60 each, Rfl: 6 .  Fluticasone-Umeclidin-Vilant (TRELEGY ELLIPTA) 200-62.5-25 MCG/INH AEPB, Inhale 1 puff into the lungs daily., Disp: 60 each, Rfl: 6 .  Fluticasone-Umeclidin-Vilant (TRELEGY ELLIPTA) 200-62.5-25 MCG/INH AEPB, Inhale 1 puff into the lungs daily., Disp: 1 each, Rfl: 0 .  hydrOXYzine (ATARAX/VISTARIL) 10 MG tablet, Take by mouth., Disp: , Rfl:  .  nitroGLYCERIN (NITROSTAT) 0.4 MG SL tablet, Place 0.4 mg under the tongue every 5 (five) minutes as needed for chest pain. , Disp: , Rfl:  .  rosuvastatin (CRESTOR) 10 MG tablet, Take 10 mg by mouth at bedtime. , Disp: , Rfl:  .  spironolactone (ALDACTONE) 25 MG tablet, Take 25 mg by mouth daily., Disp: , Rfl:  .  valACYclovir (VALTREX) 1000 MG tablet, Take by mouth., Disp: , Rfl:  .  ferrous sulfate 325 (65 FE) MG tablet, Take 1 tablet (325 mg total) by mouth daily with breakfast., Disp: 30 tablet, Rfl: 0 No current facility-administered medications for this visit.  Facility-Administered Medications Ordered in Other Visits:  .  promethazine (PHENERGAN) injection 6.25-12.5 mg, 6.25-12.5 mg, Intravenous, Q15 min PRN, Daiva Huge, Leanord Hawking, MD   Garner Nash, DO [facility]  12/23/2019 9:50 AM

## 2020-02-08 ENCOUNTER — Telehealth: Payer: Self-pay | Admitting: Medical Oncology

## 2020-02-08 NOTE — Telephone Encounter (Signed)
-----   Message from Curt Bears, MD sent at 02/05/2020  3:07 PM EDT ----- Regarding: RE: Patient Question She can take over-the-counter iron tablets until I see her on 02/29/2020.  Thank you. ----- Message ----- From: Teodoro Spray, RN Sent: 02/05/2020   2:12 PM EDT To: Curt Bears, MD, [facility] Subject: Patient Question                               Patient called office stating she recently saw her PCP and her iron level was 10. Patient is working on having labs faxed to our office. Her next office visit is scheduled for 02/29/20.  Patient wants to know if she needs to have her iron level addressed before her next visit.  Please advise.  Thank you.

## 2020-02-08 NOTE — Telephone Encounter (Signed)
Oral Iron- intolerance-Pt said she does not tolerate oral iron. I told her to keep her appt.

## 2020-02-26 ENCOUNTER — Other Ambulatory Visit: Payer: Self-pay

## 2020-02-26 ENCOUNTER — Inpatient Hospital Stay: Payer: Medicare Other | Attending: Internal Medicine

## 2020-02-26 ENCOUNTER — Ambulatory Visit (HOSPITAL_COMMUNITY)
Admission: RE | Admit: 2020-02-26 | Discharge: 2020-02-26 | Disposition: A | Discharge status: Home / Self Care | Payer: Medicare Other | Source: Ambulatory Visit | Attending: Internal Medicine | Admitting: Internal Medicine

## 2020-02-26 DIAGNOSIS — I739 Peripheral vascular disease, unspecified: Secondary | ICD-10-CM | POA: Diagnosis not present

## 2020-02-26 DIAGNOSIS — J449 Chronic obstructive pulmonary disease, unspecified: Secondary | ICD-10-CM | POA: Insufficient documentation

## 2020-02-26 DIAGNOSIS — I10 Essential (primary) hypertension: Secondary | ICD-10-CM | POA: Diagnosis not present

## 2020-02-26 DIAGNOSIS — R0609 Other forms of dyspnea: Secondary | ICD-10-CM | POA: Diagnosis not present

## 2020-02-26 DIAGNOSIS — Z79899 Other long term (current) drug therapy: Secondary | ICD-10-CM | POA: Diagnosis not present

## 2020-02-26 DIAGNOSIS — C3431 Malignant neoplasm of lower lobe, right bronchus or lung: Secondary | ICD-10-CM | POA: Insufficient documentation

## 2020-02-26 DIAGNOSIS — Z951 Presence of aortocoronary bypass graft: Secondary | ICD-10-CM | POA: Diagnosis not present

## 2020-02-26 DIAGNOSIS — K219 Gastro-esophageal reflux disease without esophagitis: Secondary | ICD-10-CM | POA: Diagnosis not present

## 2020-02-26 DIAGNOSIS — I2510 Atherosclerotic heart disease of native coronary artery without angina pectoris: Secondary | ICD-10-CM | POA: Insufficient documentation

## 2020-02-26 DIAGNOSIS — R5383 Other fatigue: Secondary | ICD-10-CM | POA: Insufficient documentation

## 2020-02-26 DIAGNOSIS — E785 Hyperlipidemia, unspecified: Secondary | ICD-10-CM | POA: Insufficient documentation

## 2020-02-26 DIAGNOSIS — C3490 Malignant neoplasm of unspecified part of unspecified bronchus or lung: Secondary | ICD-10-CM | POA: Diagnosis present

## 2020-02-26 DIAGNOSIS — D649 Anemia, unspecified: Secondary | ICD-10-CM | POA: Insufficient documentation

## 2020-02-26 LAB — CBC WITH DIFFERENTIAL (CANCER CENTER ONLY)
Abs Immature Granulocytes: 0.02 10*3/uL (ref 0.00–0.07)
Basophils Absolute: 0 10*3/uL (ref 0.0–0.1)
Basophils Relative: 1 %
Eosinophils Absolute: 0.3 10*3/uL (ref 0.0–0.5)
Eosinophils Relative: 5 %
HCT: 29.1 % — ABNORMAL LOW (ref 36.0–46.0)
Hemoglobin: 9.1 g/dL — ABNORMAL LOW (ref 12.0–15.0)
Immature Granulocytes: 0 %
Lymphocytes Relative: 29 %
Lymphs Abs: 1.8 10*3/uL (ref 0.7–4.0)
MCH: 28.3 pg (ref 26.0–34.0)
MCHC: 31.3 g/dL (ref 30.0–36.0)
MCV: 90.7 fL (ref 80.0–100.0)
Monocytes Absolute: 0.6 10*3/uL (ref 0.1–1.0)
Monocytes Relative: 9 %
Neutro Abs: 3.4 10*3/uL (ref 1.7–7.7)
Neutrophils Relative %: 56 %
Platelet Count: 251 10*3/uL (ref 150–400)
RBC: 3.21 MIL/uL — ABNORMAL LOW (ref 3.87–5.11)
RDW: 12.7 % (ref 11.5–15.5)
WBC Count: 6.1 10*3/uL (ref 4.0–10.5)
nRBC: 0 % (ref 0.0–0.2)

## 2020-02-26 LAB — CMP (CANCER CENTER ONLY)
ALT: 7 U/L (ref 0–44)
AST: 15 U/L (ref 15–41)
Albumin: 3.7 g/dL (ref 3.5–5.0)
Alkaline Phosphatase: 87 U/L (ref 38–126)
Anion gap: 10 (ref 5–15)
BUN: 21 mg/dL (ref 8–23)
CO2: 24 mmol/L (ref 22–32)
Calcium: 9.6 mg/dL (ref 8.9–10.3)
Chloride: 107 mmol/L (ref 98–111)
Creatinine: 1.65 mg/dL — ABNORMAL HIGH (ref 0.44–1.00)
GFR, Est AFR Am: 34 mL/min — ABNORMAL LOW (ref >60)
GFR, Estimated: 30 mL/min — ABNORMAL LOW (ref >60)
Glucose, Bld: 104 mg/dL — ABNORMAL HIGH (ref 70–99)
Potassium: 5 mmol/L (ref 3.5–5.1)
Sodium: 141 mmol/L (ref 135–145)
Total Bilirubin: 0.3 mg/dL (ref 0.3–1.2)
Total Protein: 7.6 g/dL (ref 6.5–8.1)

## 2020-02-29 ENCOUNTER — Inpatient Hospital Stay (HOSPITAL_BASED_OUTPATIENT_CLINIC_OR_DEPARTMENT_OTHER): Payer: Medicare Other | Admitting: Internal Medicine

## 2020-02-29 ENCOUNTER — Encounter: Payer: Self-pay | Admitting: Internal Medicine

## 2020-02-29 ENCOUNTER — Other Ambulatory Visit: Payer: Self-pay

## 2020-02-29 VITALS — BP 166/64 | HR 61 | Temp 97.9°F | Resp 16 | Wt 163.7 lb

## 2020-02-29 DIAGNOSIS — I10 Essential (primary) hypertension: Secondary | ICD-10-CM | POA: Diagnosis not present

## 2020-02-29 DIAGNOSIS — C3431 Malignant neoplasm of lower lobe, right bronchus or lung: Secondary | ICD-10-CM | POA: Diagnosis not present

## 2020-02-29 DIAGNOSIS — C3490 Malignant neoplasm of unspecified part of unspecified bronchus or lung: Secondary | ICD-10-CM

## 2020-02-29 DIAGNOSIS — D539 Nutritional anemia, unspecified: Secondary | ICD-10-CM | POA: Diagnosis not present

## 2020-02-29 NOTE — Progress Notes (Signed)
[place] Telephone:([phone]) [phone]   [phone]  OFFICE PROGRESS NOTE  [name], MD [address] [place]  [address]  DIAGNOSIS:  1) stage IA non-small cell lung cancer, squamous cell carcinoma presented with right lower lobe pulmonary nodule. 2) persistent anemia questionable for anemia of chronic disease plus/minus iron deficiency.  PRIOR THERAPY:  1) Feraheme infusion last dose was given 06/20/2016. 2) stereotactic radiotherapy to the right lower lobe lung nodule under the care of Dr. Tammi Klippel.  CURRENT THERAPY: Observation.  INTERVAL HISTORY: [name] 79 y.o. female returns to the clinic today for follow-up visit.  The patient is feeling fine today with no concerning complaints except for shortness of breath with exertion.  She denied having any chest pain, cough or hemoptysis.  She denied having any fever or chills.  She has no nausea, vomiting, diarrhea or constipation.  She has no headache or visual changes.  She is here today for evaluation with repeat CT scan of the chest for restaging of her disease.  MEDICAL HISTORY: Past Medical History:  Diagnosis Date  . 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 Asthma   . 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  . Blood in stool    last week of aug 2018  . Carotid artery occlusion   . Complication of anesthesia   . COPD (chronic obstructive pulmonary disease) ([place])   . Coronary artery disease   . 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 Diverticulitis   . Dyspnea    with exertion  . Gastroesophageal reflux disease    occ  . Glaucoma    right eye  . Headache    tension  . Headache syndrome 11/27/2018  . Hiatal hernia   . Hyperlipidemia   . Hypertension   . Iliac artery aneurysm ([place])   . Lung cancer ([place]) dx 2018   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  . Myocardial  infarction [facility]) 01/01/2000   Cardiac catheterization  . Peripheral vascular disease ([place])    stents in legs x 2 or 3  . Pneumonia    last time winter 2017 -2018  . PONV (postoperative nausea and vomiting)    occassionally, last colonscopy did ok with anesthesia  . Reflux     ALLERGIES:  is allergic to azithromycin, codeine, doxycycline, hydromorphone, levaquin [levofloxacin], avelox [moxifloxacin hcl in nacl], oxycodone-acetaminophen, and risedronate.  MEDICATIONS:  Current Outpatient Medications  Medication Sig Dispense Refill  . acetaminophen (TYLENOL) 325 MG tablet Take 650 mg by mouth every 6 (six) hours as needed for moderate pain or headache.     Marland Kitchen amLODipine-olmesartan (AZOR) 5-40 MG tablet Take 1 tablet by mouth daily.    . cetirizine (ZYRTEC) 10 MG tablet Take 10 mg by mouth as needed for allergies.    Marland Kitchen clopidogrel (PLAVIX) 75 MG tablet Take 1 tablet (75 mg total) by mouth daily. 90 tablet 3  . dexlansoprazole (DEXILANT) 60 MG capsule Take 1 capsule (60 mg total) by mouth daily before breakfast. 30 capsule 11  . ferrous sulfate 325 (65 FE) MG tablet Take 1 tablet (325 mg total) by mouth daily with breakfast. 30 tablet 0  . Fluticasone-Umeclidin-Vilant (TRELEGY ELLIPTA) 100-62.5-25 MCG/INH AEPB Inhale 1 puff into the lungs daily. 60 each 6  . Fluticasone-Umeclidin-Vilant (TRELEGY ELLIPTA) 200-62.5-25 MCG/INH AEPB Inhale 1 puff into the lungs daily. 60 each 6  .  Fluticasone-Umeclidin-Vilant (TRELEGY ELLIPTA) 200-62.5-25 MCG/INH AEPB Inhale 1 puff into the lungs daily. 1 each 0  . hydrOXYzine (ATARAX/VISTARIL) 10 MG tablet Take by mouth.    . nitroGLYCERIN (NITROSTAT) 0.4 MG SL tablet Place 0.4 mg under the tongue every 5 (five) minutes as needed for chest pain.     . rosuvastatin (CRESTOR) 10 MG tablet Take 10 mg by mouth at bedtime.     Marland Kitchen spironolactone (ALDACTONE) 25 MG tablet Take 25 mg by mouth daily.    . valACYclovir (VALTREX) 1000 MG tablet Take by mouth.     No current  facility-administered medications for this visit.   Facility-Administered Medications Ordered in Other Visits  Medication Dose Route Frequency Provider Last Rate Last Admin  . promethazine (PHENERGAN) injection 6.25-12.5 mg  6.25-12.5 mg Intravenous Q15 min PRN Brennan Bailey, MD        SURGICAL HISTORY:  Past Surgical History:  Procedure Laterality Date  . ABDOMINAL HYSTERECTOMY    . APPENDECTOMY    . Arnold-chiari malformation repair  1998   Suboccipital craniectomy  . CAROTID ENDARTERECTOMY  03/29/2010   Left  CEA  . CHOLECYSTECTOMY     Gall Bladder  . COLONOSCOPY WITH PROPOFOL N/A 04/22/2015   Procedure: COLONOSCOPY WITH PROPOFOL;  Surgeon: Carol Ada, MD;  Location: [facility];  Service: Endoscopy;  Laterality: N/A;  . COLONOSCOPY WITH PROPOFOL N/A 05/25/2016   Procedure: COLONOSCOPY WITH PROPOFOL;  Surgeon: Carol Ada, MD;  Location: [facility];  Service: Endoscopy;  Laterality: N/A;  . COLONOSCOPY WITH PROPOFOL N/A 05/03/2017   Procedure: COLONOSCOPY WITH PROPOFOL;  Surgeon: Carol Ada, MD;  Location: [facility];  Service: Endoscopy;  Laterality: N/A;  . CORNEAL TRANSPLANT     Right  . CORONARY ARTERY BYPASS GRAFT  01/01/2000   x 3  . ENTEROSCOPY N/A 02/27/2018   Procedure: ENTEROSCOPY;  Surgeon: Carol Ada, MD;  Location: [facility];  Service: Endoscopy;  Laterality: N/A;  . ENTEROSCOPY N/A 07/18/2018   Procedure: ENTEROSCOPY;  Surgeon: Carol Ada, MD;  Location: [facility];  Service: Endoscopy;  Laterality: N/A;  . ESOPHAGOGASTRODUODENOSCOPY N/A 05/25/2016   Procedure: ESOPHAGOGASTRODUODENOSCOPY (EGD);  Surgeon: Carol Ada, MD;  Location: [facility];  Service: Endoscopy;  Laterality: N/A;  . ESOPHAGOGASTRODUODENOSCOPY N/A 08/01/2018   Procedure: ESOPHAGOGASTRODUODENOSCOPY (EGD);  Surgeon: Carol Ada, MD;  Location: [facility];  Service: Endoscopy;  Laterality: N/A;  . EYE SURGERY Right 1995 or 1996   Laser surgery for retinal hemorrhage  .  GIVENS CAPSULE STUDY N/A 07/16/2018   Procedure: GIVENS CAPSULE STUDY;  Surgeon: Carol Ada, MD;  Location: [facility];  Service: Endoscopy;  Laterality: N/A;  . HOT HEMOSTASIS N/A 02/27/2018   Procedure: HOT HEMOSTASIS (ARGON PLASMA COAGULATION/BICAP);  Surgeon: Carol Ada, MD;  Location: [facility];  Service: Endoscopy;  Laterality: N/A;  . HOT HEMOSTASIS N/A 08/01/2018   Procedure: HOT HEMOSTASIS (ARGON PLASMA COAGULATION/BICAP);  Surgeon: Carol Ada, MD;  Location: [facility];  Service: Endoscopy;  Laterality: N/A;  . IR RADIOLOGIST EVAL & MGMT  12/14/2016  . LEFT HEART CATH AND CORS/GRAFTS ANGIOGRAPHY N/A 01/09/2018   Procedure: LEFT HEART CATH AND CORS/GRAFTS ANGIOGRAPHY;  Surgeon: Charolette Forward, MD;  Location: [facility];  Service: Cardiovascular;  Laterality: N/A;  . LEFT HEART CATHETERIZATION WITH CORONARY ANGIOGRAM N/A 08/03/2014   Procedure: LEFT HEART CATHETERIZATION WITH CORONARY ANGIOGRAM;  Surgeon: Birdie Riddle, MD;  Location: [facility];  Service: Cardiovascular;  Laterality: N/A;  . Post Coronary Artery  BPG  01/05/2000  Right jugular sheath removed  . PR VEIN BYPASS GRAFT,AORTO-FEM-POP    . ROTATOR CUFF REPAIR     Right    REVIEW OF SYSTEMS:  Constitutional: positive for fatigue Eyes: negative Ears, nose, mouth, throat, and face: negative Respiratory: positive for dyspnea on exertion Cardiovascular: negative Gastrointestinal: negative Genitourinary:negative Integument/breast: negative Hematologic/lymphatic: negative Musculoskeletal:negative Neurological: negative Behavioral/Psych: negative Endocrine: negative Allergic/Immunologic: negative   PHYSICAL EXAMINATION: General appearance: alert, cooperative, fatigued and no distress Head: Normocephalic, without obvious abnormality, atraumatic Neck: no adenopathy, no JVD, supple, symmetrical, trachea midline and thyroid not enlarged, symmetric, no tenderness/mass/nodules Lymph nodes: Cervical,  supraclavicular, and axillary nodes normal. Resp: clear to auscultation bilaterally Back: symmetric, no curvature. ROM normal. No CVA tenderness. Cardio: regular rate and rhythm, S1, S2 normal, no murmur, click, rub or gallop GI: soft, non-tender; bowel sounds normal; no masses,  no organomegaly Extremities: extremities normal, atraumatic, no cyanosis or edema Neurologic: Alert and oriented X 3, normal strength and tone. Normal symmetric reflexes. Normal coordination and gait  ECOG PERFORMANCE STATUS: 1 - Symptomatic but completely ambulatory  There were no vitals taken for this visit.  LABORATORY DATA: Lab Results  Component Value Date   WBC 6.1 02/26/2020   HGB 9.1 (L) 02/26/2020   HCT 29.1 (L) 02/26/2020   MCV 90.7 02/26/2020   PLT 251 02/26/2020      Chemistry      Component Value Date/Time   NA 141 02/26/2020 0842   NA 139 08/02/2017 0821   K 5.0 02/26/2020 0842   K 4.3 08/02/2017 0821   CL 107 02/26/2020 0842   CO2 24 02/26/2020 0842   CO2 24 08/02/2017 0821   BUN 21 02/26/2020 0842   BUN 14.4 08/02/2017 0821   CREATININE 1.65 (H) 02/26/2020 0842   CREATININE 1.3 (H) 08/02/2017 0821      Component Value Date/Time   CALCIUM 9.6 02/26/2020 0842   CALCIUM 9.2 08/02/2017 0821   ALKPHOS 87 02/26/2020 0842   ALKPHOS 66 08/02/2017 0821   AST 15 02/26/2020 0842   AST 14 08/02/2017 0821   ALT 7 02/26/2020 0842   ALT 8 08/02/2017 0821   BILITOT 0.3 02/26/2020 0842   BILITOT 0.34 08/02/2017 0821       RADIOGRAPHIC STUDIES: CT Chest Wo Contrast  Result Date: 02/26/2020 CLINICAL DATA:  Primary Cancer Type: Lung Imaging Indication: Routine surveillance Interval therapy since last imaging? No Initial Cancer Diagnosis Date: 04/11/2016; Established by: Biopsy-proven Detailed Pathology: Stage IA non-small cell lung cancer, squamous cell carcinoma. Primary Tumor location: Right lower lobe. Surgeries: Appendectomy, hysterectomy, cholecystectomy, CABG. Chemotherapy: No  Immunotherapy? No Radiation therapy? Yes; Date Range: 05/21/2016-05/28/2016; Target: Right lung EXAM: CT CHEST WITHOUT CONTRAST TECHNIQUE: Multidetector CT imaging of the chest was performed following the standard protocol without IV contrast. COMPARISON:  Most recent CT chest 02/23/2019.  03/14/2016 PET-CT. FINDINGS: Cardiovascular: Bovine arch. Aortic atherosclerosis. Normal heart size, without pericardial effusion. Prior median sternotomy for CABG. Mediastinum/Nodes: No mediastinal or definite hilar adenopathy, given limitations of unenhanced CT. Lungs/Pleura: Mild pleural thickening, including overlying the posterolateral right seventh rib fracture, similar. No pleural fluid. Mild centrilobular emphysema. Mild biapical pleuroparenchymal scarring. Right lower lobe architectural distortion and irregular consolidation, presumably radiation induced. Slightly more soft tissue density today at the site of greater ground-glass on the prior. The previously described underlying pulmonary nodule is less distinct today, likely identified on 73/7. Tiny nodules along the right minor fissure on 50/7 and within the posterior right upper lobe on 41/7 are similar, likely benign.  A calcified granuloma within the inferior anterior right upper lobe is similar on 58/7. Upper Abdomen: Cholecystectomy. Normal imaged portions of the spleen, stomach, pancreas, liver, adrenal glands, kidneys. Abdominal aortic atherosclerosis. Musculoskeletal: Mild osteopenia. Prior median sternotomy. Nonacute seventh right rib fracture, incompletely healed, similar. IMPRESSION: 1. Redemonstration of architectural distortion with presumed radiation induced consolidation in the right lower lobe. Slightly greater soft tissue density today, favored to be related to evolution of radiation of fibrosis. 2. No thoracic adenopathy or evidence of metastatic disease. 3. Aortic atherosclerosis (ICD10-I70.0) and emphysema (ICD10-J43.9). 4. Chronic right seventh rib  fracture. Electronically Signed   By: Abigail Miyamoto M.D.   On: 02/26/2020 10:17    ASSESSMENT AND PLAN:  This is a very pleasant 79 years old white female with a stage IA non-small cell lung cancer, squamous cell carcinoma presented with right lower lobe pulmonary nodule status post stereotactic radiotherapy.  The patient is feeling fine today with no concerning complaints except for the shortness of breath with exertion. She had repeat CT scan of the chest that showed no concerning findings for disease progression or metastasis. I recommended for her to continue on observation with repeat CT scan of the chest in 1 year. For the anemia of chronic disease plus minus iron deficiency, I recommended for her to take over-the-counter oral iron tablets at regular basis at least 2 tablets every day. I will see her back for follow-up visit in 1 year for evaluation. For the hypertension she was advised to take her blood pressure medication as prescribed and to monitor it closely at home. She was advised to call immediately if she has any other concerning symptoms in the interval. All questions were answered. The patient knows to call the clinic with any problems, questions or concerns. We can certainly see the patient much sooner if necessary.  Disclaimer: This note was dictated with voice recognition software. Similar sounding words can inadvertently be transcribed and may not be corrected upon review.

## 2020-03-02 ENCOUNTER — Telehealth: Payer: Self-pay | Admitting: Internal Medicine

## 2020-03-02 NOTE — Telephone Encounter (Signed)
Scheduled per los. Called and left msg. Mailed printout  °

## 2020-03-24 ENCOUNTER — Other Ambulatory Visit (HOSPITAL_COMMUNITY): Payer: Self-pay | Admitting: Interventional Radiology

## 2020-03-24 DIAGNOSIS — I671 Cerebral aneurysm, nonruptured: Secondary | ICD-10-CM

## 2020-04-06 ENCOUNTER — Ambulatory Visit (HOSPITAL_COMMUNITY)
Admission: RE | Admit: 2020-04-06 | Discharge: 2020-04-06 | Disposition: A | Discharge status: Home / Self Care | Payer: Medicare Other | Source: Ambulatory Visit | Attending: Interventional Radiology | Admitting: Interventional Radiology

## 2020-04-06 DIAGNOSIS — I671 Cerebral aneurysm, nonruptured: Secondary | ICD-10-CM | POA: Insufficient documentation

## 2020-04-13 ENCOUNTER — Ambulatory Visit: Payer: Medicare Other | Admitting: Pulmonary Disease

## 2020-04-15 ENCOUNTER — Telehealth (HOSPITAL_COMMUNITY): Payer: Self-pay

## 2020-04-15 NOTE — Telephone Encounter (Signed)
Pt agreed to f/u in 1 year with [facility]ra. AW

## 2020-05-06 ENCOUNTER — Other Ambulatory Visit: Payer: Self-pay | Admitting: Nephrology

## 2020-05-06 DIAGNOSIS — N184 Chronic kidney disease, stage 4 (severe): Secondary | ICD-10-CM

## 2020-05-11 ENCOUNTER — Other Ambulatory Visit: Payer: Medicare Other

## 2020-05-18 ENCOUNTER — Ambulatory Visit
Admission: RE | Admit: 2020-05-18 | Discharge: 2020-05-18 | Disposition: A | Discharge status: Home / Self Care | Payer: Medicare Other | Source: Ambulatory Visit | Attending: Nephrology | Admitting: Nephrology

## 2020-05-18 DIAGNOSIS — N184 Chronic kidney disease, stage 4 (severe): Secondary | ICD-10-CM

## 2020-05-23 NOTE — Progress Notes (Signed)
Synopsis: Referred in January 2020 to establish care, former patient of Dr. Lenna Gilford   Subjective:   PATIENT ID: [name] GENDER: female DOB: [birth year], MRN: [id number]  Chief Complaint  Patient presents with  . 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  She is followed with Dr. Earlie Server and Dr. Tammi Klippel.  In the oncology services.  She has an appointment with him tomorrow.  She had a recent CT scan completed yesterday which reveals right lower lobe groundglass opacities and scarring after stereotactic radiation to the right lower lobe.  She also has a small 7 mm central nodular location that will need to be followed.  She had PFTs completed today prior to her office visit.  The pulmonary function test revealed moderate COPD with an FEV1 65% postbronchodilator reduced ratio of 66, significant bronchodilator response with greater than 12% improvement, as well as a reduced DLCO 55% predicted.  Patient states today that she quit using her Anora inhaler several months ago because she did not know if it was making much difference in her breathing symptoms.  Before that she liked being on it.  There was some confusion regarding stopping blood pressure medication Azor versus stopping her Anoro.  She may have misunderstood that that information regarding changing of medications.  However she does have a stock supply of an oral at home that she could restart.  She did think it made a little bit of difference but was not sure.  At baseline she is dyspneic on exertion but she does not have any significant cough or daily sputum production.  OV 02/19/2019: recently in the hospital for TIA symptoms. No stroke. Now on plavix, taken off ASA. Followed planned with Dr. Julien Nordmann from oncology on 15th. Doing well since hospitalization. Concerned about her o2 levels.  They were low a few times in there hospital. Quit smoking in 2001.   OV 09/09/2019: Patient seen today for follow-up regarding her COPD.  Her symptoms are stable.  She denies chest pain nausea vomiting diarrhea.  She does have some shortness of breath with exertion.  She lives at home.  She is able to complete her activities of daily living.  She does have questions today regarding the Covid vaccine.  Patient was counseled on Covid vaccination and encouraged to obtain this next available opportunity.  OV 12/23/2019: Here today for follow-up regarding COPD.  Patient recently seen by Dr. Loanne Drilling for exacerbation and shortness of breath increasing shortness of breath.  She had inhaler change.  At this time doing well with the inhaler but still has some shortness of breath with significant exertion.  She wanted know if there is a different medication that may go to change to.  Denies weight loss denies hemoptysis.  She has follow-up scheduled with Dr. Earlie Server in July with a repeat CT scan.  OV 05/25/2020: Patient here today for follow-up regarding her COPD.  Overall doing well today.  Currently managed with her Trelegy inhaler.  She recently had palpitations and saw her cardiologist.  She has a Holter monitor in place attached to the left chest.  She has had a few fluttering events.  She does have dyspnea on exertion which has been stable for her.  She does need her flu shot today.  She has had both of her COVID-19 vaccinations.  She  denies fevers chills night sweats weight loss cough and sputum production.    Past Medical History:  Diagnosis Date  . 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 Asthma   . 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  . Blood in stool    last week of aug 2018  . Carotid artery occlusion   . Complication of anesthesia   . COPD (chronic obstructive pulmonary disease) ([place])   . Coronary  artery disease   . 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 Diverticulitis   . Dyspnea    with exertion  . Gastroesophageal reflux disease    occ  . Glaucoma    right eye  . Headache    tension  . Headache syndrome 11/27/2018  . Hiatal hernia   . Hyperlipidemia   . Hypertension   . Iliac artery aneurysm ([place])   . Lung cancer ([place]) dx 2018   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  . Myocardial infarction [facility]) 01/01/2000   Cardiac catheterization  . Peripheral vascular disease ([place])    stents in legs x 2 or 3  . Pneumonia    last time winter 2017 -2018  . PONV (postoperative nausea and vomiting)    occassionally, last colonscopy did ok with anesthesia  . Reflux      Family History  Problem Relation Age of Onset  . Heart disease Mother        Heart Disease before age 64  . Hypertension Mother   . Hyperlipidemia Mother   . Heart attack Mother   . Clotting disorder Mother   . Heart disease Father        Heart Disease before age 52  . Heart attack Father   . Hyperlipidemia Father   . Hypertension Father   . Heart disease Brother        Heart Disease before age 24  . Hyperlipidemia Brother   . Hypertension Brother   . Clotting disorder Brother   . AAA (abdominal aortic aneurysm) Brother   . Cerebral aneurysm Sister   . Hypertension Sister   . AAA (abdominal aortic aneurysm) Sister   . Asthma Sister   . Cerebral aneurysm Brother   . Cancer Brother        Lung  . Hypertension Brother   . Heart attack Brother   . Heart disease Brother        Aneurysm of Brain  . Hypertension Brother   . Heart disease Brother   . Heart disease Brother   . Stroke Son        Aneurysm of Stomach  . AAA (abdominal aortic aneurysm) Son   . Cancer Maternal Uncle        great uncle/cancer/type unknown     Past Surgical History:  Procedure Laterality Date  . ABDOMINAL HYSTERECTOMY    . APPENDECTOMY    . Arnold-chiari malformation repair  1998   Suboccipital craniectomy  .  CAROTID ENDARTERECTOMY  03/29/2010   Left  CEA  . CHOLECYSTECTOMY     Gall Bladder  . COLONOSCOPY WITH PROPOFOL N/A 04/22/2015   Procedure: COLONOSCOPY WITH PROPOFOL;  Surgeon: Carol Ada, MD;  Location: [facility];  Service: Endoscopy;  Laterality: N/A;  . COLONOSCOPY WITH PROPOFOL N/A 05/25/2016   Procedure: COLONOSCOPY WITH PROPOFOL;  Surgeon: Carol Ada, MD;  Location: [facility];  Service: Endoscopy;  Laterality: N/A;  . COLONOSCOPY WITH PROPOFOL N/A 05/03/2017  Procedure: COLONOSCOPY WITH PROPOFOL;  Surgeon: Carol Ada, MD;  Location: [facility];  Service: Endoscopy;  Laterality: N/A;  . CORNEAL TRANSPLANT     Right  . CORONARY ARTERY BYPASS GRAFT  01/01/2000   x 3  . ENTEROSCOPY N/A 02/27/2018   Procedure: ENTEROSCOPY;  Surgeon: Carol Ada, MD;  Location: [facility];  Service: Endoscopy;  Laterality: N/A;  . ENTEROSCOPY N/A 07/18/2018   Procedure: ENTEROSCOPY;  Surgeon: Carol Ada, MD;  Location: [facility];  Service: Endoscopy;  Laterality: N/A;  . ESOPHAGOGASTRODUODENOSCOPY N/A 05/25/2016   Procedure: ESOPHAGOGASTRODUODENOSCOPY (EGD);  Surgeon: Carol Ada, MD;  Location: [facility];  Service: Endoscopy;  Laterality: N/A;  . ESOPHAGOGASTRODUODENOSCOPY N/A 08/01/2018   Procedure: ESOPHAGOGASTRODUODENOSCOPY (EGD);  Surgeon: Carol Ada, MD;  Location: [facility];  Service: Endoscopy;  Laterality: N/A;  . EYE SURGERY Right 1995 or 1996   Laser surgery for retinal hemorrhage  . GIVENS CAPSULE STUDY N/A 07/16/2018   Procedure: GIVENS CAPSULE STUDY;  Surgeon: Carol Ada, MD;  Location: [facility];  Service: Endoscopy;  Laterality: N/A;  . HOT HEMOSTASIS N/A 02/27/2018   Procedure: HOT HEMOSTASIS (ARGON PLASMA COAGULATION/BICAP);  Surgeon: Carol Ada, MD;  Location: [facility];  Service: Endoscopy;  Laterality: N/A;  . HOT HEMOSTASIS N/A 08/01/2018   Procedure: HOT HEMOSTASIS (ARGON PLASMA COAGULATION/BICAP);  Surgeon: Carol Ada, MD;  Location: [facility];  Service: Endoscopy;  Laterality: N/A;  . IR RADIOLOGIST EVAL & MGMT  12/14/2016  . LEFT HEART CATH AND CORS/GRAFTS ANGIOGRAPHY N/A 01/09/2018   Procedure: LEFT HEART CATH AND CORS/GRAFTS ANGIOGRAPHY;  Surgeon: Charolette Forward, MD;  Location: [place] [facility];  Service: Cardiovascular;  Laterality: N/A;  . LEFT HEART CATHETERIZATION WITH CORONARY ANGIOGRAM N/A 08/03/2014   Procedure: LEFT HEART CATHETERIZATION WITH CORONARY ANGIOGRAM;  Surgeon: Birdie Riddle, MD;  Location: [place] C[facility];  Service: Cardiovascular;  Laterality: N/A;  . Post Coronary Artery  BPG  01/05/2000   Right jugular sheath removed  . PR VEIN BYPASS GRAFT,AORTO-FEM-POP    . ROTATOR CUFF REPAIR     Right    Social History   Socioeconomic History  . Marital status: Widowed    Spouse name: Not on file  . Number of children: 4  . Years of education: 7TH  . Highest education level: Not on file  Occupational History  . Not on file  Tobacco Use  . Smoking status: Former Smoker    Packs/day: 1.50    Years: 30.00    Pack years: 45.00    Types: Cigarettes    Quit date: 08/13/2000    Years since quitting: 19.7  . Smokeless tobacco: Never Used  Vaping Use  . Vaping Use: Never used  Substance and Sexual Activity  . Alcohol use: No    Alcohol/week: 0.0 standard drinks  . Drug use: No  . Sexual activity: Never  Other Topics Concern  . Not on file  Social History Narrative   Lives at home w/ her son   Right-handed   Drinks coffee and [employer]eps[employer] daily   Social Determinants of Health   Financial Resource Strain:   . Difficulty of Paying Living Expenses: Not on file  Food Insecurity:   . Worried About Charity fundraiser in the Last Year: Not on file  . Ran Out of Food in the Last Year: Not on file  Transportation Needs:   . Lack of Transportation (Medical): Not on file  . Lack of Transportation (Non-Medical): Not on file  Physical Activity:   .  Days of Exercise per Week: Not on file  . Minutes of Exercise  per Session: Not on file  Stress:   . Feeling of Stress : Not on file  Social Connections:   . Frequency of Communication with Friends and Family: Not on file  . Frequency of Social Gatherings with Friends and Family: Not on file  . Attends Religious Services: Not on file  . Active Member of Clubs or Organizations: Not on file  . Attends Archivist Meetings: Not on file  . Marital Status: Not on file  Intimate Partner Violence:   . Fear of Current or Ex-Partner: Not on file  . Emotionally Abused: Not on file  . Physically Abused: Not on file  . Sexually Abused: Not on file     Allergies  Allergen Reactions  . Azithromycin Swelling    Patient reported past history of lip swelling  . Codeine Other (See Comments)    Dr. Terrence Dupont advised patient not to take this medication  . Doxycycline Swelling    Mouth, lips, feet swelling  . Hydromorphone Palpitations and Other (See Comments)    DILAUDID  -  Pt had a Heart Attack after taking Dilaudid.  . Levaquin [Levofloxacin] Other (See Comments)    Chest pressure, SOB, "pain in between shoulder blades", sweaty -as reported by patient per experience in ED this afternoon  . Avelox [Moxifloxacin Hcl In Nacl] Palpitations    Caused Heart Attack   . Oxycodone-Acetaminophen Other (See Comments)    Says it makes her feel weird  . Risedronate Other (See Comments)    Chest pain     Outpatient Medications Prior to Visit  Medication Sig Dispense Refill  . acetaminophen (TYLENOL) 325 MG tablet Take 650 mg by mouth every 6 (six) hours as needed for moderate pain or headache.     Marland Kitchen amLODipine-olmesartan (AZOR) 5-40 MG tablet Take 1 tablet by mouth daily.    . cetirizine (ZYRTEC) 10 MG tablet Take 10 mg by mouth as needed for allergies.    Marland Kitchen clopidogrel (PLAVIX) 75 MG tablet Take 1 tablet (75 mg total) by mouth daily. 90 tablet 3  . dexlansoprazole (DEXILANT) 60 MG capsule Take 1 capsule (60 mg total) by mouth daily before breakfast. 30  capsule 11  . ferrous sulfate 325 (65 FE) MG tablet Take 1 tablet (325 mg total) by mouth daily with breakfast. 30 tablet 0  . Fluticasone-Umeclidin-Vilant (TRELEGY ELLIPTA) 200-62.5-25 MCG/INH AEPB Inhale 1 puff into the lungs daily. 60 each 6  . hydrOXYzine (ATARAX/VISTARIL) 10 MG tablet Take by mouth.    . nitroGLYCERIN (NITROSTAT) 0.4 MG SL tablet Place 0.4 mg under the tongue every 5 (five) minutes as needed for chest pain.     . rosuvastatin (CRESTOR) 10 MG tablet Take 10 mg by mouth at bedtime.     Marland Kitchen spironolactone (ALDACTONE) 25 MG tablet Take 25 mg by mouth daily.    . valACYclovir (VALTREX) 1000 MG tablet Take by mouth.    . Fluticasone-Umeclidin-Vilant (TRELEGY ELLIPTA) 100-62.5-25 MCG/INH AEPB Inhale 1 puff into the lungs daily. 60 each 6  . Fluticasone-Umeclidin-Vilant (TRELEGY ELLIPTA) 200-62.5-25 MCG/INH AEPB Inhale 1 puff into the lungs daily. 1 each 0   Facility-Administered Medications Prior to Visit  Medication Dose Route Frequency Provider Last Rate Last Admin  . promethazine (PHENERGAN) injection 6.25-12.5 mg  6.25-12.5 mg Intravenous Q15 min PRN Brennan Bailey, MD        Review of Systems  Constitutional: Negative for chills, fever,  malaise/fatigue and weight loss.  HENT: Negative for hearing loss, sore throat and tinnitus.   Eyes: Negative for blurred vision and double vision.  Respiratory: Positive for cough and shortness of breath. Negative for hemoptysis, sputum production, wheezing and stridor.   Cardiovascular: Negative for chest pain, palpitations, orthopnea, leg swelling and PND.  Gastrointestinal: Negative for abdominal pain, constipation, diarrhea, heartburn, nausea and vomiting.  Genitourinary: Negative for dysuria, hematuria and urgency.  Musculoskeletal: Negative for joint pain and myalgias.  Skin: Negative for itching and rash.  Neurological: Negative for dizziness, tingling, weakness and headaches.  Endo/Heme/Allergies: Negative for environmental  allergies. Does not bruise/bleed easily.  Psychiatric/Behavioral: Negative for depression. The patient is not nervous/anxious and does not have insomnia.   All other systems reviewed and are negative.    Objective:  Physical Exam Vitals reviewed.  Constitutional:      General: She is not in acute distress.    Appearance: She is well-developed.  HENT:     Head: Normocephalic and atraumatic.     Mouth/Throat:     Pharynx: No oropharyngeal exudate.  Eyes:     Conjunctiva/sclera: Conjunctivae normal.     Pupils: Pupils are equal, round, and reactive to light.  Neck:     Vascular: No JVD.     Trachea: No tracheal deviation.     Comments: Loss of supraclavicular fat Cardiovascular:     Rate and Rhythm: Normal rate and regular rhythm.     Heart sounds: S1 normal and S2 normal.     Comments: Distant heart tones Pulmonary:     Effort: No tachypnea or accessory muscle usage.     Breath sounds: No stridor. Decreased breath sounds (throughout all lung fields) present. No wheezing, rhonchi or rales.  Abdominal:     General: Bowel sounds are normal. There is no distension.     Palpations: Abdomen is soft.     Tenderness: There is no abdominal tenderness.  Musculoskeletal:        General: No deformity (muscle wasting ).  Skin:    General: Skin is warm and dry.     Capillary Refill: Capillary refill takes less than 2 seconds.     Findings: No rash.  Neurological:     Mental Status: She is alert and oriented to person, place, and time.  Psychiatric:        Behavior: Behavior normal.     Vitals:   05/25/20 0914  BP: 138/66  Pulse: 66  Temp: (!) 96.4 F (35.8 C)  TempSrc: Skin  SpO2: 96%  Weight: 166 lb 3.2 oz (75.4 kg)  Height: 5\' 2"  (1.575 m)   96% on RA BMI Readings from Last 3 Encounters:  05/25/20 30.40 kg/m  02/29/20 29.94 kg/m  12/23/19 30.11 kg/m   Wt Readings from Last 3 Encounters:  05/25/20 166 lb 3.2 oz (75.4 kg)  02/29/20 163 lb 11.2 oz (74.3 kg)   12/23/19 164 lb 9.6 oz (74.7 kg)   Chest Imaging:  08/25/2018 CT Chest:  Imaging reviewed.  Right lower lobe areas of groundglass and scarring status post sterotactic radiation likely radiation-induced parenchymal scarring.  Small nodular density within this area 7 mm in size.  She also has a new associated/adjacent rib fracture within that right side which may be concerning and would need to be followed for any recurrence of squamous cell disease. The patient's images have been independently reviewed by me.    02/23/2019 CT Chest: Follow up scheduled for cancer eval and staging  Chest x-ray 11/10/2019: Chronic scarring in the bases.  Mildly hyperinflated lung fields consistent with COPD diagnosis. The patient's images have been independently reviewed by me.     Pulmonary Functions Testing Results: PFT Results Latest Ref Rng & Units 08/26/2018 02/14/2018 04/10/2016  FVC-Pre L 1.69 1.80 1.53  FVC-Predicted Pre % 65 69 57  FVC-Post L 1.91 1.99 1.82  FVC-Predicted Post % 74 77 69  Pre FEV1/FVC % % 67 72 71  Post FEV1/FCV % % 66 72 70  FEV1-Pre L 1.12 1.30 1.09  FEV1-Predicted Pre % 58 67 54  FEV1-Post L 1.26 1.43 1.28  DLCO uncorrected ml/min/mmHg 11.93 - 11.07  DLCO UNC% % 55 - 51  DLCO corrected ml/min/mmHg 13.35 - -  DLCO COR %Predicted % 61 - -  DLVA Predicted % 97 - 86  TLC L 3.85 - 3.77  TLC % Predicted % 81 - 79  RV % Predicted % 94 - 96     FeNO: None   Pathology:   Squamous cell carcinoma of the lung right lower lobe status post stereotactic radiation therapy  Echocardiogram: none recent   Heart Catheterization:   Jan 09, 2018: Dr. Nelta Numbers LAD to Prox LAD lesion is 80% stenosed.  Ost 1st Diag lesion is 100% stenosed.  Prox RCA lesion is 20% stenosed.  Mid RCA lesion is 30% stenosed.  Mid LM to Dist LM lesion is 40% stenosed.  Ost Ramus lesion is 40% stenosed.  Ost Cx lesion is 40% stenosed.  Origin lesion is 100% stenosed.  Origin to Prox Graft  lesion is 100% stenosed.  The left ventricular systolic function is normal.  LV end diastolic pressure is normal.  The left ventricular ejection fraction is 50-55% by visual estimate.    Assessment & Plan:     ICD-10-CM   1. Stage 2 moderate COPD by GOLD classification (HCC)  J44.9 albuterol (PROVENTIL) (2.5 MG/3ML) 0.083% nebulizer solution  2. Primary cancer of right lower lobe of lung (HCC)  C34.31   3. Hx of radiation therapy  Z92.3   4. Hx of cancer of lung  Z85.118   5. Flu vaccine need  Z23   6. Coronary artery disease involving native heart without angina pectoris, unspecified vessel or lesion type  I25.10   7. S/P CABG x 3  Z95.1   8. DOE (dyspnea on exertion)  R06.00     Assessment:   This is a 79 year old female past medical history of stage II COPD, history of right lower lobe lung cancer, status post radiation.  History of coronary artery disease status post CABG currently on antiplatelet therapy.  Multiple stable chronic illnesses that lead to her dyspnea on exertion and shortness of breath.  Plan: Continue Trelegy inhaler Refills given today of this Continue albuterol for shortness of breath and wheezing as needed. Refills of albuterol short acting HFA inhaler as well as nebulizer solution. She has a functional nebulizer at home. Congratulated her on her COVID-19 vaccination. She does need her flu shot today. We will give her this.  Can return to clinic in 6 months for follow-up.    Current Outpatient Medications:  .  acetaminophen (TYLENOL) 325 MG tablet, Take 650 mg by mouth every 6 (six) hours as needed for moderate pain or headache. , Disp: , Rfl:  .  amLODipine-olmesartan (AZOR) 5-40 MG tablet, Take 1 tablet by mouth daily., Disp: , Rfl:  .  cetirizine (ZYRTEC) 10 MG tablet, Take 10 mg by mouth as needed  for allergies., Disp: , Rfl:  .  clopidogrel (PLAVIX) 75 MG tablet, Take 1 tablet (75 mg total) by mouth daily., Disp: 90 tablet, Rfl: 3 .   dexlansoprazole (DEXILANT) 60 MG capsule, Take 1 capsule (60 mg total) by mouth daily before breakfast., Disp: 30 capsule, Rfl: 11 .  ferrous sulfate 325 (65 FE) MG tablet, Take 1 tablet (325 mg total) by mouth daily with breakfast., Disp: 30 tablet, Rfl: 0 .  Fluticasone-Umeclidin-Vilant (TRELEGY ELLIPTA) 200-62.5-25 MCG/INH AEPB, Inhale 1 puff into the lungs daily., Disp: 60 each, Rfl: 6 .  hydrOXYzine (ATARAX/VISTARIL) 10 MG tablet, Take by mouth., Disp: , Rfl:  .  nitroGLYCERIN (NITROSTAT) 0.4 MG SL tablet, Place 0.4 mg under the tongue every 5 (five) minutes as needed for chest pain. , Disp: , Rfl:  .  rosuvastatin (CRESTOR) 10 MG tablet, Take 10 mg by mouth at bedtime. , Disp: , Rfl:  .  spironolactone (ALDACTONE) 25 MG tablet, Take 25 mg by mouth daily., Disp: , Rfl:  .  valACYclovir (VALTREX) 1000 MG tablet, Take by mouth., Disp: , Rfl:  No current facility-administered medications for this visit.  Facility-Administered Medications Ordered in Other Visits:  .  promethazine (PHENERGAN) injection 6.25-12.5 mg, 6.25-12.5 mg, Intravenous, Q15 min PRN, Howze, Leanord Hawking, MD  I spent 31 minutes dedicated to the care of this patient on the date of this encounter to include pre-visit review of records, face-to-face time with the patient discussing conditions above, post visit ordering of testing, clinical documentation with the electronic health record, making appropriate referrals as documented, and communicating necessary findings to members of the patients care team.    Garner Nash, DO [facility] 05/25/2020 9:20 AM

## 2020-05-23 NOTE — Patient Instructions (Addendum)
Thank you for visiting Dr. Valeta Harms at [facility]. Today we recommend the following:  Meds ordered this encounter  Medications  . Fluticasone-Umeclidin-Vilant (TRELEGY ELLIPTA) 200-62.5-25 MCG/INH AEPB    Sig: Inhale 1 puff into the lungs daily.    Dispense:  60 each    Refill:  6  . albuterol (VENTOLIN HFA) 108 (90 Base) MCG/ACT inhaler    Sig: Inhale 2 puffs into the lungs every 6 (six) hours as needed for wheezing or shortness of breath.    Dispense:  8 g    Refill:  6  . albuterol (PROVENTIL) (2.5 MG/3ML) 0.083% nebulizer solution    Sig: Take 3 mLs (2.5 mg total) by nebulization every 6 (six) hours as needed for wheezing or shortness of breath.    Dispense:  75 mL    Refill:  12   Return in about 6 months (around 11/23/2020) for w/ Dr. Valeta Harms .     Please do your part to reduce the spread of COVID-19.

## 2020-05-25 ENCOUNTER — Other Ambulatory Visit: Payer: Self-pay

## 2020-05-25 ENCOUNTER — Encounter: Payer: Self-pay | Admitting: Pulmonary Disease

## 2020-05-25 ENCOUNTER — Ambulatory Visit (INDEPENDENT_AMBULATORY_CARE_PROVIDER_SITE_OTHER): Payer: Medicare Other | Admitting: Pulmonary Disease

## 2020-05-25 VITALS — BP 138/66 | HR 66 | Temp 96.4°F | Ht 62.0 in | Wt 166.2 lb

## 2020-05-25 DIAGNOSIS — Z923 Personal history of irradiation: Secondary | ICD-10-CM

## 2020-05-25 DIAGNOSIS — R0600 Dyspnea, unspecified: Secondary | ICD-10-CM

## 2020-05-25 DIAGNOSIS — C3431 Malignant neoplasm of lower lobe, right bronchus or lung: Secondary | ICD-10-CM | POA: Diagnosis not present

## 2020-05-25 DIAGNOSIS — Z85118 Personal history of other malignant neoplasm of bronchus and lung: Secondary | ICD-10-CM

## 2020-05-25 DIAGNOSIS — I2510 Atherosclerotic heart disease of native coronary artery without angina pectoris: Secondary | ICD-10-CM

## 2020-05-25 DIAGNOSIS — J449 Chronic obstructive pulmonary disease, unspecified: Secondary | ICD-10-CM

## 2020-05-25 DIAGNOSIS — Z951 Presence of aortocoronary bypass graft: Secondary | ICD-10-CM

## 2020-05-25 DIAGNOSIS — R0609 Other forms of dyspnea: Secondary | ICD-10-CM

## 2020-05-25 DIAGNOSIS — Z23 Encounter for immunization: Secondary | ICD-10-CM | POA: Diagnosis not present

## 2020-05-25 MED ORDER — TRELEGY ELLIPTA 200-62.5-25 MCG/INH IN AEPB
1.0000 | INHALATION_SPRAY | Freq: Every day | RESPIRATORY_TRACT | 6 refills | Status: DC
Start: 2020-05-25 — End: 2020-05-25

## 2020-05-25 MED ORDER — TRELEGY ELLIPTA 200-62.5-25 MCG/INH IN AEPB
1.0000 | INHALATION_SPRAY | Freq: Every day | RESPIRATORY_TRACT | 6 refills | Status: DC
Start: 2020-05-25 — End: 2021-06-15

## 2020-05-25 MED ORDER — ALBUTEROL SULFATE HFA 108 (90 BASE) MCG/ACT IN AERS: 2.0000 | INHALATION_SPRAY | Freq: Four times a day (QID) | RESPIRATORY_TRACT | 6 refills | Status: AC | PRN

## 2020-05-25 MED ORDER — ALBUTEROL SULFATE (2.5 MG/3ML) 0.083% IN NEBU: 2.5000 mg | INHALATION_SOLUTION | Freq: Four times a day (QID) | RESPIRATORY_TRACT | 12 refills | Status: DC | PRN

## 2020-05-25 NOTE — Addendum Note (Signed)
Addended by: Elton Sin on: 05/25/2020 10:06 AM   Modules accepted: Orders

## 2020-06-27 ENCOUNTER — Other Ambulatory Visit: Payer: Self-pay

## 2020-06-27 ENCOUNTER — Encounter (HOSPITAL_COMMUNITY): Payer: Self-pay | Admitting: Gastroenterology

## 2020-06-27 ENCOUNTER — Other Ambulatory Visit: Payer: Self-pay | Admitting: Gastroenterology

## 2020-06-27 ENCOUNTER — Other Ambulatory Visit (HOSPITAL_COMMUNITY)
Admission: RE | Admit: 2020-06-27 | Discharge: 2020-06-27 | Disposition: A | Discharge status: Home / Self Care | Payer: Medicare Other | Source: Ambulatory Visit | Attending: Gastroenterology | Admitting: Gastroenterology

## 2020-06-27 DIAGNOSIS — Z01812 Encounter for preprocedural laboratory examination: Secondary | ICD-10-CM | POA: Diagnosis present

## 2020-06-27 DIAGNOSIS — Z20822 Contact with and (suspected) exposure to covid-19: Secondary | ICD-10-CM | POA: Insufficient documentation

## 2020-06-27 LAB — SARS CORONAVIRUS 2 (TAT 6-24 HRS): SARS Coronavirus 2: NEGATIVE

## 2020-06-29 ENCOUNTER — Encounter (HOSPITAL_COMMUNITY): Payer: Self-pay | Admitting: Gastroenterology

## 2020-06-29 ENCOUNTER — Other Ambulatory Visit: Payer: Self-pay

## 2020-06-29 ENCOUNTER — Encounter (HOSPITAL_COMMUNITY)
Admission: RE | Disposition: A | Discharge status: Home / Self Care | Payer: Self-pay | Source: Home / Self Care | Attending: Gastroenterology

## 2020-06-29 ENCOUNTER — Ambulatory Visit (HOSPITAL_COMMUNITY): Payer: Medicare Other | Admitting: Anesthesiology

## 2020-06-29 ENCOUNTER — Ambulatory Visit (HOSPITAL_COMMUNITY)
Admission: RE | Admit: 2020-06-29 | Discharge: 2020-06-29 | Disposition: A | Discharge status: Home / Self Care | Payer: Medicare Other | Attending: Gastroenterology | Admitting: Gastroenterology

## 2020-06-29 DIAGNOSIS — K31811 Angiodysplasia of stomach and duodenum with bleeding: Secondary | ICD-10-CM | POA: Diagnosis not present

## 2020-06-29 DIAGNOSIS — Z87891 Personal history of nicotine dependence: Secondary | ICD-10-CM | POA: Insufficient documentation

## 2020-06-29 DIAGNOSIS — Z885 Allergy status to narcotic agent status: Secondary | ICD-10-CM | POA: Diagnosis not present

## 2020-06-29 DIAGNOSIS — K644 Residual hemorrhoidal skin tags: Secondary | ICD-10-CM | POA: Diagnosis not present

## 2020-06-29 DIAGNOSIS — K921 Melena: Secondary | ICD-10-CM | POA: Insufficient documentation

## 2020-06-29 DIAGNOSIS — Z881 Allergy status to other antibiotic agents status: Secondary | ICD-10-CM | POA: Insufficient documentation

## 2020-06-29 DIAGNOSIS — K5521 Angiodysplasia of colon with hemorrhage: Secondary | ICD-10-CM | POA: Insufficient documentation

## 2020-06-29 HISTORY — PX: ENTEROSCOPY: SHX5533

## 2020-06-29 HISTORY — PX: HOT HEMOSTASIS: SHX5433

## 2020-06-29 HISTORY — PX: COLONOSCOPY WITH PROPOFOL: SHX5780

## 2020-06-29 HISTORY — PX: HEMOSTASIS CLIP PLACEMENT: SHX6857

## 2020-06-29 HISTORY — DX: Presence of dental prosthetic device (complete) (partial): Z97.2

## 2020-06-29 HISTORY — DX: Presence of spectacles and contact lenses: Z97.3

## 2020-06-29 LAB — POCT I-STAT, CHEM 8
BUN: 20 mg/dL (ref 8–23)
Calcium, Ion: 1.19 mmol/L (ref 1.15–1.40)
Chloride: 105 mmol/L (ref 98–111)
Creatinine, Ser: 1.8 mg/dL — ABNORMAL HIGH (ref 0.44–1.00)
Glucose, Bld: 86 mg/dL (ref 70–99)
HCT: 23 % — ABNORMAL LOW (ref 36.0–46.0)
Hemoglobin: 7.8 g/dL — ABNORMAL LOW (ref 12.0–15.0)
Potassium: 4.7 mmol/L (ref 3.5–5.1)
Sodium: 142 mmol/L (ref 135–145)
TCO2: 24 mmol/L (ref 22–32)

## 2020-06-29 SURGERY — ENTEROSCOPY
Anesthesia: Monitor Anesthesia Care

## 2020-06-29 MED ORDER — GLYCOPYRROLATE 0.2 MG/ML IJ SOLN
INTRAMUSCULAR | Status: DC | PRN
  Administered 2020-06-29: .1 mg via INTRAVENOUS

## 2020-06-29 MED ORDER — ALBUMIN HUMAN 5 % IV SOLN: INTRAVENOUS | Status: DC | PRN

## 2020-06-29 MED ORDER — LIDOCAINE 2% (20 MG/ML) 5 ML SYRINGE
INTRAMUSCULAR | Status: DC | PRN
  Administered 2020-06-29: 40 mg via INTRAVENOUS

## 2020-06-29 MED ORDER — LACTATED RINGERS IV SOLN
INTRAVENOUS | Status: DC
  Administered 2020-06-29: 1000 mL via INTRAVENOUS

## 2020-06-29 MED ORDER — PROPOFOL 10 MG/ML IV BOLUS
INTRAVENOUS | Status: DC | PRN
  Administered 2020-06-29: 20 mg via INTRAVENOUS

## 2020-06-29 MED ORDER — PROPOFOL 500 MG/50ML IV EMUL
INTRAVENOUS | Status: DC | PRN
  Administered 2020-06-29: 130 ug/kg/min via INTRAVENOUS

## 2020-06-29 MED ORDER — SODIUM CHLORIDE 0.9 % IV SOLN: INTRAVENOUS | Status: DC

## 2020-06-29 SURGICAL SUPPLY — 22 items

## 2020-06-29 NOTE — Discharge Instructions (Signed)

## 2020-06-29 NOTE — Op Note (Signed)
[facility] Patient Name: [name] Procedure Date: 06/29/2020 MRN: [id number] Attending MD: Carol Ada , MD Date of Birth: June 27, 1941 CSN: [id number] Age: 79 Admit Type: Outpatient Procedure:                Small bowel enteroscopy Indications:              Melena Providers:                Carol Ada, MD, Cleda Daub, RN, Laverda Sorenson,                            Technician, Benetta Spar, Technician, Enrigue Catena, CRNA Referring MD:              Medicines:                Propofol per Anesthesia Complications:            No immediate complications. Estimated Blood Loss:     Estimated blood loss: none. Procedure:                Pre-Anesthesia Assessment:                           - Prior to the procedure, a History and Physical                            was performed, and patient medications and                            allergies were reviewed. The patient's tolerance of                            previous anesthesia was also reviewed. The risks                            and benefits of the procedure and the sedation                            options and risks were discussed with the patient.                            All questions were answered, and informed consent                            was obtained. Prior Anticoagulants: The patient has                            taken no previous anticoagulant or antiplatelet                            agents. ASA Grade Assessment: II - A patient with                            mild systemic disease.  After reviewing the risks                            and benefits, the patient was deemed in                            satisfactory condition to undergo the procedure.                           - Sedation was administered by an anesthesia                            professional. Deep sedation was attained.                           After obtaining informed consent, the endoscope was                             passed under direct vision. Throughout the                            procedure, the patient's blood pressure, pulse, and                            oxygen saturations were monitored continuously. The                            PCF-H190DL ([id number]) Olympus pediatric colonscope                            was introduced through the mouth and advanced to                            the small bowel distal to the Ligament of Treitz.                            The small bowel enteroscopy was accomplished                            without difficulty. The patient tolerated the                            procedure well. Scope In: Scope Out: Findings:      The esophagus was normal.      Two small angiodysplastic lesions with stigmata of recent bleeding were       found in the gastric body. Coagulation for tissue destruction using       monopolar probe was successful.      The examined duodenum was normal.      There was no evidence of significant pathology in the entire examined       portion of jejunum.      In the fourth portion of the duodenum versus the proximal jejunum a       tattoo site was identified. No source of bleeding in this area. Impression:               -  Normal esophagus.                           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 Treated with a monopolar probe.                           - Normal examined duodenum.                           - The examined portion of the jejunum was normal.                           - No specimens collected. Recommendation:           - Proceed with the colonoscopy. The current                            findings do not support a proximal GI source for                            the bleeding. Procedure Code(s):        --- Professional ---                           [phone], Small intestinal endoscopy, enteroscopy                            beyond second portion of  duodenum, not including                            ileum; with control of bleeding (eg, injection,                            bipolar cautery, unipolar cautery, laser, heater                            probe, stapler, plasma coagulator) Diagnosis Code(s):        --- Professional ---                           K31.811, Angiodysplasia of stomach and duodenum                            with bleeding                           K92.1, Melena (includes Hematochezia) CPT copyright 2019 [employer]. All rights reserved. The codes documented in this report are preliminary and upon coder review may  be revised to meet current compliance requirements. Carol Ada, MD Carol Ada, MD 06/29/2020 1:15:05 PM This report has been signed electronically. Number of Addenda: 0

## 2020-06-29 NOTE — Anesthesia Postprocedure Evaluation (Signed)
Anesthesia Post Note  Patient: CHANIYAH JAHR  Procedure(s) Performed: ENTEROSCOPY (N/A ) COLONOSCOPY WITH PROPOFOL (N/A ) HOT HEMOSTASIS (ARGON PLASMA COAGULATION/BICAP) (N/A ) HEMOSTASIS CLIP PLACEMENT     Patient location during evaluation: PACU Anesthesia Type: MAC Level of consciousness: awake and alert Pain management: pain level controlled Vital Signs Assessment: post-procedure vital signs reviewed and stable Respiratory status: spontaneous breathing and respiratory function stable Cardiovascular status: stable Postop Assessment: no apparent nausea or vomiting Anesthetic complications: no   No complications documented.  Last Vitals:  Vitals:   06/29/20 1330 06/29/20 1340  BP: (!) 139/46 (!) 151/63  Pulse: (!) 57 (!) 54  Resp: (!) 21 16  Temp:    SpO2: 99% 94%    Last Pain:  Vitals:   06/29/20 1340  TempSrc:   PainSc: 0-No pain                 Kennice Finnie DANIEL

## 2020-06-29 NOTE — Transfer of Care (Signed)
Immediate Anesthesia Transfer of Care Note  Patient: [name]  Procedure(s) Performed: ENTEROSCOPY (N/A ) COLONOSCOPY WITH PROPOFOL (N/A ) HOT HEMOSTASIS (ARGON PLASMA COAGULATION/BICAP) (N/A ) HEMOSTASIS CLIP PLACEMENT  Patient Location: PACU  Anesthesia Type:MAC  Level of Consciousness: sedated and patient cooperative  Airway & Oxygen Therapy: Patient Spontanous Breathing and Patient connected to face mask oxygen  Post-op Assessment: Report given to RN, Post -op Vital signs reviewed and stable and Patient moving all extremities X 4  Post vital signs: stable  Last Vitals:  Vitals Value Taken Time  BP    Temp    Pulse 48 06/29/20 1309  Resp 13 06/29/20 1309  SpO2 100 % 06/29/20 1309  Vitals shown include unvalidated device data.  Last Pain:  Vitals:   06/29/20 1021  TempSrc: Oral  PainSc: 0-No pain         Complications: No complications documented.

## 2020-06-29 NOTE — Anesthesia Preprocedure Evaluation (Addendum)
Anesthesia Evaluation  Patient identified by MRN, date of birth, ID band Patient awake    Reviewed: Allergy & Precautions, NPO status , Patient's Chart, lab work & pertinent test results  History of Anesthesia Complications (+) PONV and history of anesthetic complications  Airway Mallampati: I  TM Distance: >3 FB Neck ROM: Full    Dental  (+) Upper Dentures, Lower Dentures, Dental Advisory Given   Pulmonary asthma , COPD,  COPD inhaler, former smoker,  Hx of Lung Ca   Pulmonary exam normal        Cardiovascular hypertension, Pt. on medications + CAD, + Past MI (2001), + CABG and + Peripheral Vascular Disease  Normal cardiovascular exam  IMPRESSIONS    1. The left ventricle has normal systolic function with an ejection  fraction of 60-65%. The cavity size was normal. Left ventricular diastolic  parameters were normal.  2. The right ventricle has normal systolic function. The cavity was  normal.  3. Left atrial size was mildly dilated.  4. The mitral valve is abnormal. Mild thickening of the mitral valve  leaflet.  5. The tricuspid valve is grossly normal.  6. The aortic valve is tricuspid. Mild calcification of the aortic valve.  Aortic valve regurgitation is trivial by color flow Doppler. No stenosis    Neuro/Psych  Headaches,    GI/Hepatic Neg liver ROS, hiatal hernia, GERD  Medicated and Controlled,  Endo/Other  negative endocrine ROS  Renal/GU Renal InsufficiencyRenal disease     Musculoskeletal negative musculoskeletal ROS (+)   Abdominal   Peds  Hematology  (+) Blood dyscrasia, anemia ,   Anesthesia Other Findings Day of surgery medications reviewed with the patient.  Reproductive/Obstetrics                            Anesthesia Physical  Anesthesia Plan  ASA: III  Anesthesia Plan: MAC   Post-op Pain Management:    Induction:   PONV Risk Score and Plan: 3 and  Treatment may vary due to age or medical condition, Propofol infusion and Ondansetron  Airway Management Planned: Natural Airway and Nasal Cannula  Additional Equipment:   Intra-op Plan:   Post-operative Plan:   Informed Consent: I have reviewed the patients History and Physical, chart, labs and discussed the procedure including the risks, benefits and alternatives for the proposed anesthesia with the patient or authorized representative who has indicated his/her understanding and acceptance.     Dental advisory given  Plan Discussed with: CRNA and Anesthesiologist  Anesthesia Plan Comments:        Anesthesia Quick Evaluation

## 2020-06-29 NOTE — H&P (Signed)
[name] HPI: The patient started to experience an acute onset of lower abdominal pain and hematochezia.  At one point she feels as if she was "squirting" blood into the toilet.  The physical examination this Monday she was noted to have darker stool and there was no finding from a hemorrhoidal source.  With this new presentation for a GI bleed, she was scheduled for an EGD/colonoscopy today.  Past Medical History:  Diagnosis Date  . 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 Asthma   . 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  . Blood in stool    last week of aug 2018  . Carotid artery occlusion   . Complication of anesthesia   . COPD (chronic obstructive pulmonary disease) ([place])   . Coronary artery disease   . 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 Diverticulitis   . Dyspnea    with exertion  . Gastroesophageal reflux disease    occ  . Glaucoma    right eye  . Headache    tension  . Headache syndrome 11/27/2018  . Hiatal hernia   . Hyperlipidemia   . Hypertension   . Iliac artery aneurysm ([place])   . 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 Myocardial infarction [facility]) 01/01/2000   Cardiac catheterization  . Peripheral vascular disease ([place])    stents in legs x 2 or 3  . Pneumonia    last time winter 2017 -2018  . PONV (postoperative nausea and vomiting)    occassionally, last colonscopy did ok with anesthesia  . Reflux   . Wears dentures    Full set  . Wears glasses     Past Surgical History:  Procedure Laterality Date  . ABDOMINAL HYSTERECTOMY    . APPENDECTOMY    . Arnold-chiari malformation repair  1998   Suboccipital craniectomy  . CAROTID ENDARTERECTOMY  03/29/2010   Left  CEA  . CHOLECYSTECTOMY     Gall Bladder  . COLONOSCOPY WITH PROPOFOL N/A 04/22/2015   Procedure: COLONOSCOPY WITH PROPOFOL;  Surgeon: Carol Ada, MD;  Location:  [facility];  Service: Endoscopy;  Laterality: N/A;  . COLONOSCOPY WITH PROPOFOL N/A 05/25/2016   Procedure: COLONOSCOPY WITH PROPOFOL;  Surgeon: Carol Ada, MD;  Location: [facility];  Service: Endoscopy;  Laterality: N/A;  . COLONOSCOPY WITH PROPOFOL N/A 05/03/2017   Procedure: COLONOSCOPY WITH PROPOFOL;  Surgeon: Carol Ada, MD;  Location: [facility];  Service: Endoscopy;  Laterality: N/A;  . CORNEAL TRANSPLANT     Right  . CORONARY ARTERY BYPASS GRAFT  01/01/2000   x 3  . ENTEROSCOPY N/A 02/27/2018   Procedure: ENTEROSCOPY;  Surgeon: Carol Ada, MD;  Location: [facility];  Service: Endoscopy;  Laterality: N/A;  . ENTEROSCOPY N/A 07/18/2018   Procedure: ENTEROSCOPY;  Surgeon: Carol Ada, MD;  Location: [facility];  Service: Endoscopy;  Laterality: N/A;  . ESOPHAGOGASTRODUODENOSCOPY N/A 05/25/2016   Procedure: ESOPHAGOGASTRODUODENOSCOPY (EGD);  Surgeon: Carol Ada, MD;  Location: [facility];  Service: Endoscopy;  Laterality: N/A;  . ESOPHAGOGASTRODUODENOSCOPY N/A 08/01/2018   Procedure: ESOPHAGOGASTRODUODENOSCOPY (EGD);  Surgeon: Carol Ada, MD;  Location: [facility];  Service: Endoscopy;  Laterality: N/A;  . EYE SURGERY Right 1995 or 1996   Laser surgery for retinal hemorrhage  . GIVENS CAPSULE STUDY N/A 07/16/2018   Procedure: GIVENS CAPSULE STUDY;  Surgeon: Carol Ada, MD;  Location: [facility];  Service: Endoscopy;  Laterality: N/A;  . HOT HEMOSTASIS N/A 02/27/2018   Procedure: HOT HEMOSTASIS (ARGON PLASMA COAGULATION/BICAP);  Surgeon: Carol Ada, MD;  Location: [facility];  Service: Endoscopy;  Laterality: N/A;  . HOT HEMOSTASIS N/A 08/01/2018   Procedure: HOT HEMOSTASIS (ARGON PLASMA COAGULATION/BICAP);  Surgeon: Carol Ada, MD;  Location: [facility];  Service: Endoscopy;  Laterality: N/A;  . IR RADIOLOGIST EVAL & MGMT  12/14/2016  . LEFT HEART CATH AND CORS/GRAFTS ANGIOGRAPHY N/A 01/09/2018   Procedure: LEFT HEART CATH AND CORS/GRAFTS  ANGIOGRAPHY;  Surgeon: Charolette Forward, MD;  Location: [facility];  Service: Cardiovascular;  Laterality: N/A;  . LEFT HEART CATHETERIZATION WITH CORONARY ANGIOGRAM N/A 08/03/2014   Procedure: LEFT HEART CATHETERIZATION WITH CORONARY ANGIOGRAM;  Surgeon: Birdie Riddle, MD;  Location: [facility];  Service: Cardiovascular;  Laterality: N/A;  . Post Coronary Artery  BPG  01/05/2000   Right jugular sheath removed  . PR VEIN BYPASS GRAFT,AORTO-FEM-POP    . ROTATOR CUFF REPAIR     Right    Family History  Problem Relation Age of Onset  . Heart disease Mother        Heart Disease before age 5  . Hypertension Mother   . Hyperlipidemia Mother   . Heart attack Mother   . Clotting disorder Mother   . Heart disease Father        Heart Disease before age 55  . Heart attack Father   . Hyperlipidemia Father   . Hypertension Father   . Heart disease Brother        Heart Disease before age 76  . Hyperlipidemia Brother   . Hypertension Brother   . Clotting disorder Brother   . AAA (abdominal aortic aneurysm) Brother   . Cerebral aneurysm Sister   . Hypertension Sister   . AAA (abdominal aortic aneurysm) Sister   . Asthma Sister   . Cerebral aneurysm Brother   . Cancer Brother        Lung  . Hypertension Brother   . Heart attack Brother   . Heart disease Brother        Aneurysm of Brain  . Hypertension Brother   . Heart disease Brother   . Heart disease Brother   . Stroke Son        Aneurysm of Stomach  . AAA (abdominal aortic aneurysm) Son   . Cancer Maternal Uncle        great uncle/cancer/type unknown    Social History:  reports that she quit smoking about 19 years ago. Her smoking use included cigarettes. She has a 45.00 pack-year smoking history. She has never used smokeless tobacco. She reports that she does not drink alcohol and does not use drugs.  Allergies:  Allergies  Allergen Reactions  . Azithromycin Swelling    Patient reported past history of lip swelling   . Codeine Other (See Comments)    Dr. Terrence Dupont advised patient not to take this medication  . Doxycycline Swelling    Mouth, lips, feet swelling  . Hydromorphone Palpitations and Other (See Comments)    DILAUDID  -  Pt had a Heart Attack after taking Dilaudid.  . Levaquin [Levofloxacin] Other (See Comments)    Chest pressure, SOB, "pain in between shoulder blades", sweaty -as reported by patient per experience in ED this afternoon  . Avelox [Moxifloxacin Hcl In Nacl] Palpitations    Caused Heart Attack   . Oxycodone-Acetaminophen Other (See Comments)    Says it makes  her feel weird  . Risedronate Other (See Comments)    Chest pain    Medications:  Scheduled:  Continuous: . sodium chloride    . lactated ringers 1,000 mL (06/29/20 1048)    Results for orders placed or performed during the hospital encounter of 06/29/20 (from the past 24 hour(s))  I-STAT, chem 8     Status: Abnormal   Collection Time: 06/29/20 10:47 AM  Result Value Ref Range   Sodium 142 135 - 145 mmol/L   Potassium 4.7 3.5 - 5.1 mmol/L   Chloride 105 98 - 111 mmol/L   BUN 20 8 - 23 mg/dL   Creatinine, Ser 1.80 (H) 0.44 - 1.00 mg/dL   Glucose, Bld 86 70 - 99 mg/dL   Calcium, Ion 1.19 1.15 - 1.40 mmol/L   TCO2 24 22 - 32 mmol/L   Hemoglobin 7.8 (L) 12.0 - 15.0 g/dL   HCT 23.0 (L) 36 - 46 %     No results found.  ROS:  As stated above in the HPI otherwise negative.  Blood pressure (!) 157/36, pulse (!) 53, temperature 97.8 F (36.6 C), temperature source Oral, resp. rate (!) 21, height 5\' 2"  (1.575 m), weight 71.7 kg, SpO2 95 %.    PE: Gen: NAD, Alert and Oriented HEENT:  [place]/AT, EOMI Neck: Supple, no LAD Lungs: CTA Bilaterally CV: RRR without M/G/R ABD: Soft, bilateral lower abdominal pain, +BS Ext: No C/C/E  Assessment/Plan: 1) Hematocheiza. 2) Melena. 3) Lower abdominal pain.  Plan: 1) EGD/colonoscopy.  Mekisha Bittel D 06/29/2020, 11:46 AM

## 2020-06-29 NOTE — Op Note (Signed)
[facility] Patient Name: [name] Procedure Date: 06/29/2020 MRN: [id number] Attending MD: Carol Ada , MD Date of Birth: [birth year] CSN: [id number] Age: 79 Admit Type: Outpatient Procedure:                Colonoscopy Indications:              Hematochezia, Melena Providers:                Carol Ada, MD, Cleda Daub, RN, Laverda Sorenson,                            Technician, Benetta Spar, Technician, Enrigue Catena, CRNA Referring MD:              Medicines:                Propofol per Anesthesia Complications:            No immediate complications. Estimated Blood Loss:     Estimated blood loss: none. Procedure:                Pre-Anesthesia Assessment:                           - Prior to the procedure, a History and Physical                            was performed, and patient medications and                            allergies were reviewed. The patient's tolerance of                            previous anesthesia was also reviewed. The risks                            and benefits of the procedure and the sedation                            options and risks were discussed with the patient.                            All questions were answered, and informed consent                            was obtained. Prior Anticoagulants: The patient has                            taken no previous anticoagulant or antiplatelet                            agents. ASA Grade Assessment: II - A patient with                            mild systemic disease. After  reviewing the risks                            and benefits, the patient was deemed in                            satisfactory condition to undergo the procedure.                           - Sedation was administered by an anesthesia                            professional. Deep sedation was attained.                           After obtaining informed consent, the colonoscope                             was passed under direct vision. Throughout the                            procedure, the patient's blood pressure, pulse, and                            oxygen saturations were monitored continuously. The                            PCF-H190DL ([id number] Olympus pediatric colonscope                            was introduced through the anus and advanced to the                            the cecum, identified by appendiceal orifice and                            ileocecal valve. The colonoscopy was performed                            without difficulty. The patient tolerated the                            procedure well. The quality of the bowel                            preparation was good. The ileocecal valve,                            appendiceal orifice, and rectum were photographed. Scope In: 12:26:40 PM Scope Out: 12:56:07 PM Scope Withdrawal Time: 0 hours 25 minutes 21 seconds  Total Procedure Duration: 0 hours 29 minutes 27 seconds  Findings:      Two small localized angiodysplastic lesions with bleeding were found in       the cecum. Coagulation for hemostasis using monopolar probe was  successful. To prevent bleeding post-intervention, four hemostatic clips       were successfully placed (MR unsafe). There was no bleeding at the end       of the procedure.      The prep was devoid of any evidence of blood, however, in the cecum two       bleeding sites were identified. With close observation the bleeding       continued. It appeared that these two sites were bleeding AVMs and not       from scope trauma. The two AVMs were successfully ablated with APC and a       total of four hemoclips were deployed. Examination of her hemorrhoids       showed suspicious sites of bleeding in the extenal hemorrhoids. Bleeding       was not able to be induced with manipulation of the hemorrhoids with the       colonoscope. The current findings support a proximal  source of bleeding       (melena noted on the examination glove) and a hemorrhoidal source of       bleeding (squirting of blood into the toilet). Impression:               - Two bleeding colonic angiodysplastic lesions.                            Treated with a monopolar probe. Clips (MR unsafe)                            were placed.                           - No specimens collected. Moderate Sedation:      Not Applicable - Patient had care per Anesthesia. Recommendation:           - Patient has a contact number available for                            emergencies. The signs and symptoms of potential                            delayed complications were discussed with the                            patient. Return to normal activities tomorrow.                            Written discharge instructions were provided to the                            patient.                           - Resume previous diet.                           - Continue present medications.                           -  Return to GI clinic in 2 weeks.                           - Iron supplementation. Procedure Code(s):        --- Professional ---                           [id number], Colonoscopy, flexible; with control of                            bleeding, any method Diagnosis Code(s):        --- Professional ---                           K55.21, Angiodysplasia of colon with hemorrhage                           K92.1, Melena (includes Hematochezia) CPT copyright 2019 [employer]. All rights reserved. The codes documented in this report are preliminary and upon coder review may  be revised to meet current compliance requirements. Carol Ada, MD Carol Ada, MD 06/29/2020 1:11:20 PM This report has been signed electronically. Number of Addenda: 0

## 2020-07-02 ENCOUNTER — Inpatient Hospital Stay (HOSPITAL_COMMUNITY)
Admission: EM | Admit: 2020-07-02 | Discharge: 2020-07-04 | DRG: 291 | Disposition: A | Discharge status: Home / Self Care | Payer: Medicare Other | Attending: Student | Admitting: Student

## 2020-07-02 ENCOUNTER — Emergency Department (HOSPITAL_COMMUNITY): Payer: Medicare Other

## 2020-07-02 ENCOUNTER — Encounter (HOSPITAL_COMMUNITY): Payer: Self-pay | Admitting: Emergency Medicine

## 2020-07-02 ENCOUNTER — Other Ambulatory Visit: Payer: Self-pay

## 2020-07-02 ENCOUNTER — Observation Stay (HOSPITAL_COMMUNITY): Payer: Medicare Other

## 2020-07-02 DIAGNOSIS — I252 Old myocardial infarction: Secondary | ICD-10-CM

## 2020-07-02 DIAGNOSIS — I739 Peripheral vascular disease, unspecified: Secondary | ICD-10-CM

## 2020-07-02 DIAGNOSIS — Z7902 Long term (current) use of antithrombotics/antiplatelets: Secondary | ICD-10-CM

## 2020-07-02 DIAGNOSIS — N1832 Chronic kidney disease, stage 3b: Secondary | ICD-10-CM | POA: Diagnosis present

## 2020-07-02 DIAGNOSIS — K922 Gastrointestinal hemorrhage, unspecified: Secondary | ICD-10-CM

## 2020-07-02 DIAGNOSIS — Z85118 Personal history of other malignant neoplasm of bronchus and lung: Secondary | ICD-10-CM

## 2020-07-02 DIAGNOSIS — D649 Anemia, unspecified: Secondary | ICD-10-CM | POA: Diagnosis not present

## 2020-07-02 DIAGNOSIS — K5520 Angiodysplasia of colon without hemorrhage: Secondary | ICD-10-CM

## 2020-07-02 DIAGNOSIS — Z825 Family history of asthma and other chronic lower respiratory diseases: Secondary | ICD-10-CM

## 2020-07-02 DIAGNOSIS — K31811 Angiodysplasia of stomach and duodenum with bleeding: Secondary | ICD-10-CM | POA: Diagnosis not present

## 2020-07-02 DIAGNOSIS — I5031 Acute diastolic (congestive) heart failure: Secondary | ICD-10-CM | POA: Diagnosis present

## 2020-07-02 DIAGNOSIS — Z7951 Long term (current) use of inhaled steroids: Secondary | ICD-10-CM

## 2020-07-02 DIAGNOSIS — Z8249 Family history of ischemic heart disease and other diseases of the circulatory system: Secondary | ICD-10-CM

## 2020-07-02 DIAGNOSIS — Z66 Do not resuscitate: Secondary | ICD-10-CM | POA: Diagnosis present

## 2020-07-02 DIAGNOSIS — K219 Gastro-esophageal reflux disease without esophagitis: Secondary | ICD-10-CM | POA: Diagnosis present

## 2020-07-02 DIAGNOSIS — R001 Bradycardia, unspecified: Secondary | ICD-10-CM | POA: Diagnosis present

## 2020-07-02 DIAGNOSIS — Z87891 Personal history of nicotine dependence: Secondary | ICD-10-CM

## 2020-07-02 DIAGNOSIS — Z832 Family history of diseases of the blood and blood-forming organs and certain disorders involving the immune mechanism: Secondary | ICD-10-CM

## 2020-07-02 DIAGNOSIS — Z947 Corneal transplant status: Secondary | ICD-10-CM

## 2020-07-02 DIAGNOSIS — R195 Other fecal abnormalities: Secondary | ICD-10-CM

## 2020-07-02 DIAGNOSIS — I130 Hypertensive heart and chronic kidney disease with heart failure and stage 1 through stage 4 chronic kidney disease, or unspecified chronic kidney disease: Principal | ICD-10-CM | POA: Diagnosis present

## 2020-07-02 DIAGNOSIS — Z20822 Contact with and (suspected) exposure to covid-19: Secondary | ICD-10-CM | POA: Diagnosis present

## 2020-07-02 DIAGNOSIS — Z83438 Family history of other disorder of lipoprotein metabolism and other lipidemia: Secondary | ICD-10-CM

## 2020-07-02 DIAGNOSIS — I2510 Atherosclerotic heart disease of native coronary artery without angina pectoris: Secondary | ICD-10-CM | POA: Diagnosis present

## 2020-07-02 DIAGNOSIS — Z823 Family history of stroke: Secondary | ICD-10-CM

## 2020-07-02 DIAGNOSIS — Z95828 Presence of other vascular implants and grafts: Secondary | ICD-10-CM

## 2020-07-02 DIAGNOSIS — Z79899 Other long term (current) drug therapy: Secondary | ICD-10-CM

## 2020-07-02 DIAGNOSIS — Z801 Family history of malignant neoplasm of trachea, bronchus and lung: Secondary | ICD-10-CM

## 2020-07-02 DIAGNOSIS — I480 Paroxysmal atrial fibrillation: Secondary | ICD-10-CM | POA: Diagnosis present

## 2020-07-02 DIAGNOSIS — D62 Acute posthemorrhagic anemia: Secondary | ICD-10-CM | POA: Diagnosis present

## 2020-07-02 DIAGNOSIS — Z7982 Long term (current) use of aspirin: Secondary | ICD-10-CM

## 2020-07-02 DIAGNOSIS — J449 Chronic obstructive pulmonary disease, unspecified: Secondary | ICD-10-CM | POA: Diagnosis present

## 2020-07-02 DIAGNOSIS — Z951 Presence of aortocoronary bypass graft: Secondary | ICD-10-CM

## 2020-07-02 DIAGNOSIS — E785 Hyperlipidemia, unspecified: Secondary | ICD-10-CM | POA: Diagnosis present

## 2020-07-02 LAB — COMPREHENSIVE METABOLIC PANEL
ALT: 8 U/L (ref 0–44)
AST: 15 U/L (ref 15–41)
Albumin: 3.9 g/dL (ref 3.5–5.0)
Alkaline Phosphatase: 61 U/L (ref 38–126)
Anion gap: 10 (ref 5–15)
BUN: 13 mg/dL (ref 8–23)
CO2: 23 mmol/L (ref 22–32)
Calcium: 9 mg/dL (ref 8.9–10.3)
Chloride: 108 mmol/L (ref 98–111)
Creatinine, Ser: 1.51 mg/dL — ABNORMAL HIGH (ref 0.44–1.00)
GFR, Estimated: 35 mL/min — ABNORMAL LOW (ref >60)
Glucose, Bld: 102 mg/dL — ABNORMAL HIGH (ref 70–99)
Potassium: 4 mmol/L (ref 3.5–5.1)
Sodium: 141 mmol/L (ref 135–145)
Total Bilirubin: 0.7 mg/dL (ref 0.3–1.2)
Total Protein: 6.9 g/dL (ref 6.5–8.1)

## 2020-07-02 LAB — RESP PANEL BY RT-PCR (FLU A&B, COVID) ARPGX2
Influenza A by PCR: NEGATIVE
Influenza B by PCR: NEGATIVE
SARS Coronavirus 2 by RT PCR: NEGATIVE

## 2020-07-02 LAB — PREPARE RBC (CROSSMATCH)

## 2020-07-02 LAB — CBC
HCT: 24 % — ABNORMAL LOW (ref 36.0–46.0)
Hemoglobin: 7.4 g/dL — ABNORMAL LOW (ref 12.0–15.0)
MCH: 27.4 pg (ref 26.0–34.0)
MCHC: 30.8 g/dL (ref 30.0–36.0)
MCV: 88.9 fL (ref 80.0–100.0)
Platelets: 254 10*3/uL (ref 150–400)
RBC: 2.7 MIL/uL — ABNORMAL LOW (ref 3.87–5.11)
RDW: 15.8 % — ABNORMAL HIGH (ref 11.5–15.5)
WBC: 8.2 10*3/uL (ref 4.0–10.5)
nRBC: 0 % (ref 0.0–0.2)

## 2020-07-02 LAB — HEMOGLOBIN AND HEMATOCRIT, BLOOD
HCT: 24.3 % — ABNORMAL LOW (ref 36.0–46.0)
Hemoglobin: 7.7 g/dL — ABNORMAL LOW (ref 12.0–15.0)

## 2020-07-02 LAB — BRAIN NATRIURETIC PEPTIDE: B Natriuretic Peptide: 412.4 pg/mL — ABNORMAL HIGH (ref 0.0–100.0)

## 2020-07-02 LAB — POC OCCULT BLOOD, ED: Fecal Occult Bld: POSITIVE — AB

## 2020-07-02 LAB — TROPONIN I (HIGH SENSITIVITY): Troponin I (High Sensitivity): 6 ng/L (ref <18)

## 2020-07-02 MED ORDER — ROSUVASTATIN CALCIUM 10 MG PO TABS
10.0000 mg | ORAL_TABLET | Freq: Every day | ORAL | Status: DC
  Administered 2020-07-02 – 2020-07-03 (×2): 10 mg via ORAL
  Filled 2020-07-02 (×3): qty 1

## 2020-07-02 MED ORDER — ALBUTEROL SULFATE HFA 108 (90 BASE) MCG/ACT IN AERS
2.0000 | INHALATION_SPRAY | Freq: Four times a day (QID) | RESPIRATORY_TRACT | Status: DC | PRN
  Administered 2020-07-02: 2 via RESPIRATORY_TRACT
  Filled 2020-07-02: qty 6.7

## 2020-07-02 MED ORDER — ASPIRIN EC 81 MG PO TBEC: 81.0000 mg | DELAYED_RELEASE_TABLET | Freq: Every day | ORAL | Status: DC

## 2020-07-02 MED ORDER — SODIUM CHLORIDE 0.9% IV SOLUTION: Freq: Once | INTRAVENOUS | Status: AC

## 2020-07-02 MED ORDER — PANTOPRAZOLE SODIUM 40 MG PO TBEC
40.0000 mg | DELAYED_RELEASE_TABLET | Freq: Every day | ORAL | Status: DC
  Administered 2020-07-02 – 2020-07-04 (×3): 40 mg via ORAL
  Filled 2020-07-02 (×3): qty 1

## 2020-07-02 MED ORDER — AMLODIPINE BESYLATE 10 MG PO TABS
10.0000 mg | ORAL_TABLET | Freq: Every day | ORAL | Status: DC
  Administered 2020-07-02 – 2020-07-04 (×3): 10 mg via ORAL
  Filled 2020-07-02 (×3): qty 1

## 2020-07-02 MED ORDER — UMECLIDINIUM BROMIDE 62.5 MCG/INH IN AEPB
1.0000 | INHALATION_SPRAY | Freq: Every day | RESPIRATORY_TRACT | Status: DC
  Administered 2020-07-03 – 2020-07-04 (×2): 1 via RESPIRATORY_TRACT
  Filled 2020-07-02: qty 7

## 2020-07-02 MED ORDER — AMIODARONE HCL 200 MG PO TABS
100.0000 mg | ORAL_TABLET | Freq: Every day | ORAL | Status: DC
  Administered 2020-07-03 – 2020-07-04 (×2): 100 mg via ORAL
  Filled 2020-07-02 (×2): qty 1

## 2020-07-02 MED ORDER — HYDRALAZINE HCL 20 MG/ML IJ SOLN: 10.0000 mg | Freq: Three times a day (TID) | INTRAMUSCULAR | Status: DC | PRN

## 2020-07-02 MED ORDER — ACETAMINOPHEN 650 MG RE SUPP: 650.0000 mg | Freq: Four times a day (QID) | RECTAL | Status: DC | PRN

## 2020-07-02 MED ORDER — AMLODIPINE-OLMESARTAN 10-40 MG PO TABS: 1.0000 | ORAL_TABLET | Freq: Every day | ORAL | Status: DC

## 2020-07-02 MED ORDER — ACETAMINOPHEN 325 MG PO TABS
650.0000 mg | ORAL_TABLET | Freq: Four times a day (QID) | ORAL | Status: DC | PRN
  Administered 2020-07-02 – 2020-07-04 (×5): 650 mg via ORAL
  Filled 2020-07-02 (×5): qty 2

## 2020-07-02 MED ORDER — CLOPIDOGREL BISULFATE 75 MG PO TABS: 75.0000 mg | ORAL_TABLET | Freq: Every day | ORAL | Status: DC

## 2020-07-02 MED ORDER — ONDANSETRON 4 MG PO TBDP: 4.0000 mg | ORAL_TABLET | Freq: Three times a day (TID) | ORAL | Status: DC | PRN

## 2020-07-02 MED ORDER — FLUTICASONE-UMECLIDIN-VILANT 200-62.5-25 MCG/INH IN AEPB: 1.0000 | INHALATION_SPRAY | Freq: Every day | RESPIRATORY_TRACT | Status: DC

## 2020-07-02 MED ORDER — FLUTICASONE FUROATE-VILANTEROL 200-25 MCG/INH IN AEPB
1.0000 | INHALATION_SPRAY | Freq: Every day | RESPIRATORY_TRACT | Status: DC
  Administered 2020-07-03 – 2020-07-04 (×2): 1 via RESPIRATORY_TRACT
  Filled 2020-07-02: qty 28

## 2020-07-02 MED ORDER — SPIRONOLACTONE 25 MG PO TABS
25.0000 mg | ORAL_TABLET | Freq: Every day | ORAL | Status: DC
  Administered 2020-07-02 – 2020-07-03 (×2): 25 mg via ORAL
  Filled 2020-07-02 (×3): qty 1

## 2020-07-02 MED ORDER — SODIUM CHLORIDE 0.9 % IV BOLUS
1000.0000 mL | Freq: Once | INTRAVENOUS | Status: AC
  Administered 2020-07-02: 1000 mL via INTRAVENOUS

## 2020-07-02 MED ORDER — IRBESARTAN 300 MG PO TABS
300.0000 mg | ORAL_TABLET | Freq: Every day | ORAL | Status: DC
  Administered 2020-07-02 – 2020-07-03 (×2): 300 mg via ORAL
  Filled 2020-07-02 (×3): qty 1

## 2020-07-02 NOTE — ED Provider Notes (Signed)
[facility] DEPT Provider Note   CSN: [id number] Arrival date & time: 07/02/20  2774     History Chief Complaint  Patient presents with  . GI Bleeding  . Hemoptysis    [name] is a 79 y.o. female.  79 yo F with a chief complaints of hemoptysis.  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  Since then the patient has been doing somewhat better.  Is continue to have some blood in her stool.  Noticed that she has been coughing up blood a little bit she thinks maybe she is irritated the back of her throat from so much coughing.  Had a Covid test prior to her procedure that was negative.  Is on aspirin and Plavix.  She has been having some diffuse abdominal pain.  This was occurring prior to her procedure.  Not significantly worse afterwards.  The history is provided by the patient.  Illness Severity:  Moderate Onset quality:  Gradual Duration:  2 days Timing:  Constant Progression:  Worsening Chronicity:  New Associated symptoms: cough and shortness of breath   Associated symptoms: no chest pain, no congestion, no fever, no headaches, no myalgias, no nausea, no rhinorrhea, no vomiting and no wheezing        Past Medical History:  Diagnosis Date  . 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 Asthma   . 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  . Blood in stool    last week of aug 2018  . Carotid artery occlusion   . Complication of anesthesia   . COPD (chronic obstructive pulmonary disease) ([place])   . Coronary artery disease   . 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 Diverticulitis   . Dyspnea    with exertion  . Gastroesophageal reflux disease    occ  . Glaucoma    right eye  . Headache    tension  . Headache syndrome 11/27/2018  . Hiatal hernia   . Hyperlipidemia    . Hypertension   . Iliac artery aneurysm ([place])   . 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 Myocardial infarction [facility]) 01/01/2000   Cardiac catheterization  . Peripheral vascular disease ([place])    stents in legs x 2 or 3  . Pneumonia    last time winter 2017 -2018  . PONV (postoperative nausea and vomiting)    occassionally, last colonscopy did ok with anesthesia  . Reflux   . Wears dentures    Full set  . Wears glasses     Patient Active Problem List   Diagnosis Date Noted  . Symptomatic anemia 07/02/2020  . TIA (transient ischemic attack) 02/11/2019  . Headache syndrome 11/27/2018  . GI bleed 07/15/2018  . Occult blood positive stool 07/14/2018  . History of shingles 06/23/2018  . Neuralgia and neuritis 06/23/2018  . Costochondritis, acute 04/18/2018  . Hyperkalemia 02/06/2017  . Physical deconditioning 11/08/2016  . Acute lower GI bleeding   . BRBPR (bright red blood per rectum) 09/15/2016  . Iron deficiency anemia 06/07/2016  . Bronchitis, acute 06/07/2016  . 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 Primary cancer of right lower lobe of lung ([place]) 04/25/2016  . 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 Foot pain, right 10/24/2015  . CAP (community acquired pneumonia) 10/22/2015  .  UTI (urinary tract infection) 10/22/2015  . COPD exacerbation ([place]) 10/22/2015  . Neck pain on right side 01/05/2015  . Chest pain 08/02/2014  . Intractable headache 08/02/2014  . HLD (hyperlipidemia) 08/02/2014  . Essential hypertension 08/02/2014  . GERD (gastroesophageal reflux disease) 08/02/2014  . Leg pain, right 08/02/2014  . HA (headache)   . Carotid artery stenosis 12/09/2013  . 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  . Dehydration 04/02/2013  . CAD (coronary artery disease) 04/02/2013  . AKI (acute kidney injury) ([place]) 04/01/2013  . Nausea & vomiting 04/01/2013  . Diarrhea 04/01/2013  . Hypokalemia 04/01/2013  . Hyponatremia 04/01/2013  . Peripheral  vascular disease ([place]) 01/07/2013  . Occlusion and stenosis of carotid artery without mention of cerebral infarction 01/07/2013  . Dizziness and giddiness 01/07/2013  . Shortness of breath 01/07/2013  . Cough 11/17/2012  . COPD GOLD II 11/17/2012    Past Surgical History:  Procedure Laterality Date  . ABDOMINAL HYSTERECTOMY    . APPENDECTOMY    . Arnold-chiari malformation repair  1998   Suboccipital craniectomy  . CAROTID ENDARTERECTOMY  03/29/2010   Left  CEA  . CHOLECYSTECTOMY     Gall Bladder  . COLONOSCOPY WITH PROPOFOL N/A 04/22/2015   Procedure: COLONOSCOPY WITH PROPOFOL;  Surgeon: Carol Ada, MD;  Location: [facility] END[facility];  Service: Endoscopy;  Laterality: N/A;  . COLONOSCOPY WITH PROPOFOL N/A 05/25/2016   Procedure: COLONOSCOPY WITH PROPOFOL;  Surgeon: Carol Ada, MD;  Location: [facility];  Service: Endoscopy;  Laterality: N/A;  . COLONOSCOPY WITH PROPOFOL N/A 05/03/2017   Procedure: COLONOSCOPY WITH PROPOFOL;  Surgeon: Carol Ada, MD;  Location: [facility];  Service: Endoscopy;  Laterality: N/A;  . CORNEAL TRANSPLANT     Right  . CORONARY ARTERY BYPASS GRAFT  01/01/2000   x 3  . ENTEROSCOPY N/A 02/27/2018   Procedure: ENTEROSCOPY;  Surgeon: Carol Ada, MD;  Location: [facility];  Service: Endoscopy;  Laterality: N/A;  . ENTEROSCOPY N/A 07/18/2018   Procedure: ENTEROSCOPY;  Surgeon: Carol Ada, MD;  Location: [facility];  Service: Endoscopy;  Laterality: N/A;  . ESOPHAGOGASTRODUODENOSCOPY N/A 05/25/2016   Procedure: ESOPHAGOGASTRODUODENOSCOPY (EGD);  Surgeon: Carol Ada, MD;  Location: [facility];  Service: Endoscopy;  Laterality: N/A;  . ESOPHAGOGASTRODUODENOSCOPY N/A 08/01/2018   Procedure: ESOPHAGOGASTRODUODENOSCOPY (EGD);  Surgeon: Carol Ada, MD;  Location: [facility];  Service: Endoscopy;  Laterality: N/A;  . EYE SURGERY Right 1995 or 1996   Laser surgery for retinal hemorrhage  . GIVENS CAPSULE STUDY N/A 07/16/2018   Procedure: GIVENS  CAPSULE STUDY;  Surgeon: Carol Ada, MD;  Location: [facility];  Service: Endoscopy;  Laterality: N/A;  . HOT HEMOSTASIS N/A 02/27/2018   Procedure: HOT HEMOSTASIS (ARGON PLASMA COAGULATION/BICAP);  Surgeon: Carol Ada, MD;  Location: [facility];  Service: Endoscopy;  Laterality: N/A;  . HOT HEMOSTASIS N/A 08/01/2018   Procedure: HOT HEMOSTASIS (ARGON PLASMA COAGULATION/BICAP);  Surgeon: Carol Ada, MD;  Location: [facility];  Service: Endoscopy;  Laterality: N/A;  . IR RADIOLOGIST EVAL & MGMT  12/14/2016  . LEFT HEART CATH AND CORS/GRAFTS ANGIOGRAPHY N/A 01/09/2018   Procedure: LEFT HEART CATH AND CORS/GRAFTS ANGIOGRAPHY;  Surgeon: Charolette Forward, MD;  Location: [facility];  Service: Cardiovascular;  Laterality: N/A;  . LEFT HEART CATHETERIZATION WITH CORONARY ANGIOGRAM N/A 08/03/2014   Procedure: LEFT HEART CATHETERIZATION WITH CORONARY ANGIOGRAM;  Surgeon: Birdie Riddle, MD;  Location: [facility] CATH LAB;  Service: Cardiovascular;  Laterality: N/A;  . Post Coronary Artery  BPG  01/05/2000   Right  jugular sheath removed  . PR VEIN BYPASS GRAFT,AORTO-FEM-POP    . ROTATOR CUFF REPAIR     Right     OB History   No obstetric history on file.     Family History  Problem Relation Age of Onset  . Heart disease Mother        Heart Disease before age 54  . Hypertension Mother   . Hyperlipidemia Mother   . Heart attack Mother   . Clotting disorder Mother   . Heart disease Father        Heart Disease before age 6  . Heart attack Father   . Hyperlipidemia Father   . Hypertension Father   . Heart disease Brother        Heart Disease before age 75  . Hyperlipidemia Brother   . Hypertension Brother   . Clotting disorder Brother   . AAA (abdominal aortic aneurysm) Brother   . Cerebral aneurysm Sister   . Hypertension Sister   . AAA (abdominal aortic aneurysm) Sister   . Asthma Sister   . Cerebral aneurysm Brother   . Cancer Brother        Lung  . Hypertension Brother    . Heart attack Brother   . Heart disease Brother        Aneurysm of Brain  . Hypertension Brother   . Heart disease Brother   . Heart disease Brother   . Stroke Son        Aneurysm of Stomach  . AAA (abdominal aortic aneurysm) Son   . Cancer Maternal Uncle        great uncle/cancer/type unknown    Social History   Tobacco Use  . Smoking status: Former Smoker    Packs/day: 1.50    Years: 30.00    Pack years: 45.00    Types: Cigarettes    Quit date: 08/13/2000    Years since quitting: 19.8  . Smokeless tobacco: Never Used  Vaping Use  . Vaping Use: Never used  Substance Use Topics  . Alcohol use: No    Alcohol/week: 0.0 standard drinks  . Drug use: No    Home Medications Prior to Admission medications   Medication Sig Start Date End Date Taking? Authorizing Provider  acetaminophen (TYLENOL) 325 MG tablet Take 650 mg by mouth every 6 (six) hours as needed for moderate pain or headache.    Yes [provider]  albuterol (PROVENTIL) (2.5 MG/3ML) 0.083% nebulizer solution Take 3 mLs (2.5 mg total) by nebulization every 6 (six) hours as needed for wheezing or shortness of breath. 05/25/20  Yes Icard, Bradley L, DO  albuterol (VENTOLIN HFA) 108 (90 Base) MCG/ACT inhaler Inhale 2 puffs into the lungs every 6 (six) hours as needed for wheezing or shortness of breath. 05/25/20  Yes Icard, Octavio Graves, DO  amiodarone (PACERONE) 200 MG tablet Take 100 mg by mouth daily. 05/25/20  Yes [provider]  amLODipine-olmesartan (AZOR) 10-40 MG tablet Take 1 tablet by mouth daily.    Yes [provider]  aspirin EC 81 MG tablet Take 81 mg by mouth daily. Swallow whole.   Yes [provider]  cetirizine (ZYRTEC) 10 MG tablet Take 10 mg by mouth daily as needed for allergies.    Yes [provider]  clopidogrel (PLAVIX) 75 MG tablet Take 1 tablet (75 mg total) by mouth daily. 04/02/12  Yes Conrad [place], MD  dexlansoprazole (DEXILANT) 60 MG capsule Take  1 capsule (60 mg total)  by mouth daily before breakfast. 11/17/12  Yes Tanda Rockers, MD  Fluticasone-Umeclidin-Vilant (TRELEGY ELLIPTA) 200-62.5-25 MCG/INH AEPB Inhale 1 puff into the lungs daily. 05/25/20  Yes Icard, Octavio Graves, DO  nitroGLYCERIN (NITROSTAT) 0.4 MG SL tablet Place 0.4 mg under the tongue every 5 (five) minutes as needed for chest pain.    Yes [provider]  ondansetron (ZOFRAN-ODT) 4 MG disintegrating tablet Take 4 mg by mouth every 8 (eight) hours as needed for nausea/vomiting. 02/17/20  Yes [provider]  rosuvastatin (CRESTOR) 10 MG tablet Take 10 mg by mouth at bedtime.    Yes [provider]  spironolactone (ALDACTONE) 25 MG tablet Take 25 mg by mouth daily. 03/25/17  Yes [provider]    Allergies    Azithromycin, Codeine, Doxycycline, Hydromorphone, Levaquin [levofloxacin], Vitamin d analogs, Avelox [moxifloxacin hcl in nacl], Oxycodone-acetaminophen, and Risedronate  Review of Systems   Review of Systems  Constitutional: Negative for chills and fever.  HENT: Negative for congestion and rhinorrhea.   Eyes: Negative for redness and visual disturbance.  Respiratory: Positive for cough and shortness of breath. Negative for wheezing.   Cardiovascular: Negative for chest pain and palpitations.  Gastrointestinal: Negative for nausea and vomiting.  Genitourinary: Negative for dysuria and urgency.  Musculoskeletal: Negative for arthralgias and myalgias.  Skin: Negative for pallor and wound.  Neurological: Negative for dizziness and headaches.    Physical Exam Updated Vital Signs BP (!) 164/49   Pulse (!) 52   Temp 98.7 F (37.1 C)   Resp (!) 25   SpO2 92%   Physical Exam Vitals and nursing note reviewed.  Constitutional:      General: She is not in acute distress.    Appearance: She is well-developed. She is not diaphoretic.     Comments: pale  HENT:     Head: Normocephalic and atraumatic.  Eyes:     Pupils: Pupils are  equal, round, and reactive to light.  Cardiovascular:     Rate and Rhythm: Normal rate and regular rhythm.     Heart sounds: No murmur heard.  No friction rub. No gallop.   Pulmonary:     Effort: Pulmonary effort is normal. Tachypnea present.     Breath sounds: No wheezing or rales.  Abdominal:     General: There is no distension.     Palpations: Abdomen is soft.     Tenderness: There is no abdominal tenderness.  Genitourinary:    Comments: Gross melena, hemorrhoids without bleeding Musculoskeletal:        General: No tenderness.     Cervical back: Normal range of motion and neck supple.  Skin:    General: Skin is warm and dry.  Neurological:     Mental Status: She is alert and oriented to person, place, and time.  Psychiatric:        Behavior: Behavior normal.     ED Results / Procedures / Treatments   Labs (all labs ordered are listed, but only abnormal results are displayed) Labs Reviewed  COMPREHENSIVE METABOLIC PANEL - Abnormal; Notable for the following components:      Result Value   Glucose, Bld 102 (*)    Creatinine, Ser 1.51 (*)    GFR, Estimated 35 (*)    All other components within normal limits  CBC - Abnormal; Notable for the following components:   RBC 2.70 (*)    Hemoglobin 7.4 (*)    HCT 24.0 (*)    RDW 15.8 (*)  All other components within normal limits  BRAIN NATRIURETIC PEPTIDE - Abnormal; Notable for the following components:   B Natriuretic Peptide 412.4 (*)    All other components within normal limits  POC OCCULT BLOOD, ED - Abnormal; Notable for the following components:   Fecal Occult Bld POSITIVE (*)    All other components within normal limits  RESP PANEL BY RT-PCR (FLU A&B, COVID) ARPGX2  HEMOGLOBIN AND HEMATOCRIT, BLOOD  TYPE AND SCREEN  PREPARE RBC (CROSSMATCH)  TROPONIN I (HIGH SENSITIVITY)    EKG EKG Interpretation  Date/Time:  Saturday July 02 2020 07:37:54 EST Ventricular Rate:  53 PR Interval:    QRS  Duration: 96 QT Interval:  458 QTC Calculation: 430 R Axis:   60 Text Interpretation: Sinus rhythm Short PR interval No significant change since last tracing Confirmed by Deno Etienne [phone]) on 07/02/2020 7:53:21 AM   Radiology DG Chest Port 1 View  Result Date: 07/02/2020 CLINICAL DATA:  Shortness of breath. EXAM: PORTABLE CHEST 1 VIEW COMPARISON:  11/10/2019 FINDINGS: Patient slightly rotated to the left. Sternotomy wires are intact. Lungs are adequately inflated with subtle stable density over the lateral right infrahilar region. Minimal blunting of the left costophrenic angle which may be due to parenchymal scarring or small amount of pleural fluid. Cardiomediastinal silhouette and remainder of the exam is unchanged. IMPRESSION: Minimal blunting of the left costophrenic angle which may be due to parenchymal scarring or small amount of pleural fluid. Stable chronic change right infrahilar region. Electronically Signed   By: Marin Olp M.D.   On: 07/02/2020 08:36    Procedures Procedures (including critical care time)  Medications Ordered in ED Medications  ondansetron (ZOFRAN-ODT) disintegrating tablet 4 mg (has no administration in time range)  acetaminophen (TYLENOL) tablet 650 mg (650 mg Oral Given 07/02/20 1233)    Or  acetaminophen (TYLENOL) suppository 650 mg ( Rectal See Alternative 07/02/20 1233)  hydrALAZINE (APRESOLINE) injection 10 mg (has no administration in time range)  sodium chloride 0.9 % bolus 1,000 mL (0 mLs Intravenous Stopped 07/02/20 0958)  0.9 %  sodium chloride infusion (Manually program via Guardrails IV Fluids) (0 mLs Intravenous Stopped 07/02/20 1220)    ED Course  I have reviewed the triage vital signs and the nursing notes.  Pertinent labs & imaging results that were available during my care of the patient were reviewed by me and considered in my medical decision making (see chart for details).    MDM Rules/Calculators/A&P                           79 yo F with a chief complaints of hemoptysis and shortness of breath.  Patient was just seen by her GI doctor and there was some concern for an upper GI bleed.  Since then she is still had some bloody output from her bottom.  She started coughing up blood last night.  Short of breath with minimal exertion.  No chest pain or pressure.  Will obtain a chest x-ray.  Blood work.  Reassess.  Hemoglobin trending downward from 3 days ago. Will transfuse a unit. Discussed with GI who will come evaluate at bedside. Admit.  CRITICAL CARE Performed by: Cecilio Asper   Total critical care time: 35 minutes  Critical care time was exclusive of separately billable procedures and treating other patients.  Critical care was necessary to treat or prevent imminent or life-threatening deterioration.  Critical care was time spent personally by  me on the following activities: development of treatment plan with patient and/or surrogate as well as nursing, discussions with consultants, evaluation of patient's response to treatment, examination of patient, obtaining history from patient or surrogate, ordering and performing treatments and interventions, ordering and review of laboratory studies, ordering and review of radiographic studies, pulse oximetry and re-evaluation of patient's condition.  The patients results and plan were reviewed and discussed.   Any x-rays performed were independently reviewed by myself.   Differential diagnosis were considered with the presenting HPI.  Medications  ondansetron (ZOFRAN-ODT) disintegrating tablet 4 mg (has no administration in time range)  acetaminophen (TYLENOL) tablet 650 mg (650 mg Oral Given 07/02/20 1233)    Or  acetaminophen (TYLENOL) suppository 650 mg ( Rectal See Alternative 07/02/20 1233)  hydrALAZINE (APRESOLINE) injection 10 mg (has no administration in time range)  sodium chloride 0.9 % bolus 1,000 mL (0 mLs Intravenous Stopped 07/02/20 0958)   0.9 %  sodium chloride infusion (Manually program via Guardrails IV Fluids) (0 mLs Intravenous Stopped 07/02/20 1220)    Vitals:   07/02/20 1200 07/02/20 1215 07/02/20 1230 07/02/20 1300  BP: (!) 161/101 (!) 147/41 (!) 153/65 (!) 164/49  Pulse: 60 (!) 53 (!) 56 (!) 52  Resp: (!) 28 (!) 26 18 (!) 25  Temp: 97.8 F (36.6 C) 98.7 F (37.1 C)    TempSrc: Oral     SpO2: 92% 90% 91% 92%    Final diagnoses:  Acute GI bleeding    Admission/ observation were discussed with the admitting physician, patient and/or family and they are comfortable with the plan.    Final Clinical Impression(s) / ED Diagnoses Final diagnoses:  Acute GI bleeding    Rx / DC Orders ED Discharge Orders    None       Deno Etienne, DO 07/02/20 1310

## 2020-07-02 NOTE — ED Notes (Signed)
ED TO INPATIENT HANDOFF REPORT  ED Nurse Name and Phone #: Fredonia Highland [phone]  S Name/Age/Gender [name] 79 y.o. female Room/Bed: WA16/WA16  Code Status   Code Status: DNR  Home/SNF/Other Home Patient oriented to: self, place, time and situation Is this baseline? Yes   Triage Complete: Triage complete  Chief Complaint Symptomatic anemia [D64.9]  Triage Note Patient here from home reporting rectal bleeding for weeks, bright red. Recent endoscopy Wednesday. Now reports bright red blood in sputum. States that "blood counts were low on Wednesday".     Allergies Allergies  Allergen Reactions  . Azithromycin Swelling    Patient reported past history of lip swelling  . Codeine Other (See Comments)    Dr. Terrence Dupont advised patient not to take this medication  . Doxycycline Swelling    Mouth, lips, feet swelling  . Hydromorphone Palpitations and Other (See Comments)    DILAUDID  -  Pt had a Heart Attack after taking Dilaudid.  . Levaquin [Levofloxacin] Other (See Comments)    Chest pressure, SOB, "pain in between shoulder blades", sweaty -as reported by patient per experience in ED this afternoon  . Vitamin D Analogs Swelling  . Avelox [Moxifloxacin Hcl In Nacl] Palpitations    Caused Heart Attack   . Oxycodone-Acetaminophen Other (See Comments)    Says it makes her feel weird  . Risedronate Other (See Comments)    Chest pain    Level of Care/Admitting Diagnosis ED Disposition    ED Disposition Condition Comment   Admit  Hospital Area: [facility] [[id number]]  Level of Care: Telemetry [5]  Admit to tele based on following criteria: Monitor for Ischemic changes  Covid Evaluation: Asymptomatic Screening Protocol (No Symptoms)  Diagnosis: Symptomatic anemia [[id number]]  Admitting Physician: Jonnie Finner [[id number]]  Attending Physician: Jonnie Finner [[id number]]       B Medical/Surgery History Past Medical History:  Diagnosis Date  . 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 Asthma   . 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  . Blood in stool    last week of aug 2018  . Carotid artery occlusion   . Complication of anesthesia   . COPD (chronic obstructive pulmonary disease) ([place])   . Coronary artery disease   . 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 Diverticulitis   . Dyspnea    with exertion  . Gastroesophageal reflux disease    occ  . Glaucoma    right eye  . Headache    tension  . Headache syndrome 11/27/2018  . Hiatal hernia   . Hyperlipidemia   . Hypertension   . Iliac artery aneurysm ([place])   . 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 Myocardial infarction [facility]) 01/01/2000   Cardiac catheterization  . Peripheral vascular disease ([place])    stents in legs x 2 or 3  . Pneumonia    last time winter 2017 -2018  . PONV (postoperative nausea and vomiting)    occassionally, last colonscopy did ok with anesthesia  . Reflux   . Wears dentures    Full set  . Wears glasses    Past Surgical History:  Procedure Laterality Date  . ABDOMINAL HYSTERECTOMY    . APPENDECTOMY    . Arnold-chiari malformation repair  1998   Suboccipital craniectomy  . CAROTID ENDARTERECTOMY  03/29/2010  Left  CEA  . CHOLECYSTECTOMY     Gall Bladder  . COLONOSCOPY WITH PROPOFOL N/A 04/22/2015   Procedure: COLONOSCOPY WITH PROPOFOL;  Surgeon: Carol Ada, MD;  Location: [facility];  Service: Endoscopy;  Laterality: N/A;  . COLONOSCOPY WITH PROPOFOL N/A 05/25/2016   Procedure: COLONOSCOPY WITH PROPOFOL;  Surgeon: Carol Ada, MD;  Location: [facility];  Service: Endoscopy;  Laterality: N/A;  . COLONOSCOPY WITH PROPOFOL N/A 05/03/2017   Procedure: COLONOSCOPY WITH PROPOFOL;  Surgeon: Carol Ada, MD;  Location: [facility];  Service: Endoscopy;  Laterality: N/A;  . CORNEAL TRANSPLANT     Right  . CORONARY  ARTERY BYPASS GRAFT  01/01/2000   x 3  . ENTEROSCOPY N/A 02/27/2018   Procedure: ENTEROSCOPY;  Surgeon: Carol Ada, MD;  Location: [facility];  Service: Endoscopy;  Laterality: N/A;  . ENTEROSCOPY N/A 07/18/2018   Procedure: ENTEROSCOPY;  Surgeon: Carol Ada, MD;  [facility];  Service: Endoscopy;  Laterality: N/A;  . ESOPHAGOGASTRODUODENOSCOPY N/A 05/25/2016   Procedure: ESOPHAGOGASTRODUODENOSCOPY (EGD);  Surgeon: Carol Ada, MD;  Location: [facility];  Service: Endoscopy;  Laterality: N/A;  . ESOPHAGOGASTRODUODENOSCOPY N/A 08/01/2018   Procedure: ESOPHAGOGASTRODUODENOSCOPY (EGD);  Surgeon: Carol Ada, MD;  Location: [facility];  Service: Endoscopy;  Laterality: N/A;  . EYE SURGERY Right 1995 or 1996   Laser surgery for retinal hemorrhage  . GIVENS CAPSULE STUDY N/A 07/16/2018   Procedure: GIVENS CAPSULE STUDY;  Surgeon: Carol Ada, MD;  [facility];  Service: Endoscopy;  Laterality: N/A;  . HOT HEMOSTASIS N/A 02/27/2018   Procedure: HOT HEMOSTASIS (ARGON PLASMA COAGULATION/BICAP);  Surgeon: Carol Ada, MD;  Location: [facility];  Service: Endoscopy;  Laterality: N/A;  . HOT HEMOSTASIS N/A 08/01/2018   Procedure: HOT HEMOSTASIS (ARGON PLASMA COAGULATION/BICAP);  Surgeon: Carol Ada, MD;  Location: [facility];  Service: Endoscopy;  Laterality: N/A;  . IR RADIOLOGIST EVAL & MGMT  12/14/2016  . LEFT HEART CATH AND CORS/GRAFTS ANGIOGRAPHY N/A 01/09/2018   Procedure: LEFT HEART CATH AND CORS/GRAFTS ANGIOGRAPHY;  Surgeon: Charolette Forward, MD;  Location: [facility];  Service: Cardiovascular;  Laterality: N/A;  . LEFT HEART CATHETERIZATION WITH CORONARY ANGIOGRAM N/A 08/03/2014   Procedure: LEFT HEART CATHETERIZATION WITH CORONARY ANGIOGRAM;  Surgeon: Birdie Riddle, MD;  Location: [facility];  Service: Cardiovascular;  Laterality: N/A;  . Post Coronary Artery  BPG  01/05/2000   Right jugular sheath removed  . PR VEIN BYPASS GRAFT,AORTO-FEM-POP     . ROTATOR CUFF REPAIR     Right     A IV Location/Drains/Wounds Patient Lines/Drains/Airways Status    Active Line/Drains/Airways    Name Placement date Placement time Site Days   Peripheral IV 07/02/20 Left Antecubital 07/02/20  0820  Antecubital  less than 1   External Urinary Catheter 07/02/20  1120  --  less than 1          Intake/Output Last 24 hours  Intake/Output Summary (Last 24 hours) at 07/02/2020 1819 Last data filed at 07/02/2020 1215 Gross per 24 hour  Intake 632 ml  Output --  Net 632 ml    Labs/Imaging Results for orders placed or performed during the hospital encounter of 07/02/20 (from the past 48 hour(s))  Comprehensive metabolic panel     Status: Abnormal   Collection Time: 07/02/20  8:28 AM  Result Value Ref Range   Sodium 141 135 - 145 mmol/L   Potassium 4.0 3.5 - 5.1 mmol/L   Chloride 108 98 - 111 mmol/L  CO2 23 22 - 32 mmol/L   Glucose, Bld 102 (H) 70 - 99 mg/dL    Comment: Glucose reference range applies only to samples taken after fasting for at least 8 hours.   BUN 13 8 - 23 mg/dL   Creatinine, Ser 1.51 (H) 0.44 - 1.00 mg/dL   Calcium 9.0 8.9 - 10.3 mg/dL   Total Protein 6.9 6.5 - 8.1 g/dL   Albumin 3.9 3.5 - 5.0 g/dL   AST 15 15 - 41 U/L   ALT 8 0 - 44 U/L   Alkaline Phosphatase 61 38 - 126 U/L   Total Bilirubin 0.7 0.3 - 1.2 mg/dL   GFR, Estimated 35 (L) >60 mL/min    Comment: (NOTE) Calculated using the CKD-EPI Creatinine Equation (2021)    Anion gap 10 5 - 15    Comment: Performed at [facility], [place] [address], [place], [place] [address]  CBC     Status: Abnormal   Collection Time: 07/02/20  8:28 AM  Result Value Ref Range   WBC 8.2 4.0 - 10.5 K/uL   RBC 2.70 (L) 3.87 - 5.11 MIL/uL   Hemoglobin 7.4 (L) 12.0 - 15.0 g/dL   HCT 24.0 (L) 36 - 46 %   MCV 88.9 80.0 - 100.0 fL   MCH 27.4 26.0 - 34.0 pg   MCHC 30.8 30.0 - 36.0 g/dL   RDW 15.8 (H) 11.5 - 15.5 %   Platelets 254 150 - 400 K/uL   nRBC 0.0 0.0 -  0.2 %    Comment: Performed at [facility], [place] [address], [place], [place] [address]  Type and screen [place]     Status: None (Preliminary result)   Collection Time: 07/02/20  8:28 AM  Result Value Ref Range   ABO/RH(D) A POS    Antibody Screen NEG    Sample Expiration 07/05/2020,2359    Unit Number [id number]    Blood Component Type RBC LR PHER2    Unit division 00    Status of Unit ISSUED    Transfusion Status OK TO TRANSFUSE    Crossmatch Result      Compatible Performed at [facility], [place] [address] [place], [place] [address]   Troponin I (High Sensitivity)     Status: None   Collection Time: 07/02/20  8:28 AM  Result Value Ref Range   Troponin I (High Sensitivity) 6 <18 ng/L    Comment: (NOTE) Elevated high sensitivity troponin I (hsTnI) values and significant  changes across serial measurements may suggest ACS but many other  chronic and acute conditions are known to elevate hsTnI results.  Refer to the "Links" section for chest pain algorithms and additional  guidance. Performed at [facility], [place] [address] [place], [place] [address]   Brain natriuretic peptide     Status: Abnormal   Collection Time: 07/02/20  8:28 AM  Result Value Ref Range   B Natriuretic Peptide 412.4 (H) 0.0 - 100.0 pg/mL    Comment: Performed at [facility], [place] [address] [place], [place] [address]  Prepare RBC (crossmatch)     Status: None   Collection Time: 07/02/20 10:00 AM  Result Value Ref Range   Order Confirmation      ORDER PROCESSED BY BLOOD BANK Performed at [facility], [place] [address] [place], [place] [address]   POC occult blood, ED     Status: Abnormal   Collection  Time: 07/02/20 10:51 AM  Result Value Ref Range   Fecal Occult Bld POSITIVE (A) NEGATIVE  Resp Panel by RT-PCR (Flu A&B, Covid) Nasopharyngeal Swab     Status: None   Collection  Time: 07/02/20 12:22 PM   Specimen: Nasopharyngeal Swab; Nasopharyngeal(NP) swabs in vial transport medium  Result Value Ref Range   SARS Coronavirus 2 by RT PCR NEGATIVE NEGATIVE    Comment: (NOTE) SARS-CoV-2 target nucleic acids are NOT DETECTED.  The SARS-CoV-2 RNA is generally detectable in upper respiratory specimens during the acute phase of infection. The lowest concentration of SARS-CoV-2 viral copies this assay can detect is 138 copies/mL. A negative result does not preclude SARS-Cov-2 infection and should not be used as the sole basis for treatment or other patient management decisions. A negative result may occur with  improper specimen collection/handling, submission of specimen other than nasopharyngeal swab, presence of viral mutation(s) within the areas targeted by this assay, and inadequate number of viral copies(<138 copies/mL). A negative result must be combined with clinical observations, patient history, and epidemiological information. The expected result is Negative.  Fact Sheet for Patients:  EntrepreneurPulse.com.au  Fact Sheet for Healthcare Providers:  IncredibleEmployment.be  This test is no t yet approved or cleared by the [place] FDA and  has been authorized for detection and/or diagnosis of SARS-CoV-2 by FDA under an Emergency Use Authorization (EUA). This EUA will remain  in effect (meaning this test can be used) for the duration of the COVID-19 declaration under Section 564(b)(1) of the Act, 21 U.S.C.section 360bbb-3(b)(1), unless the authorization is terminated  or revoked sooner.       Influenza A by PCR NEGATIVE NEGATIVE   Influenza B by PCR NEGATIVE NEGATIVE    Comment: (NOTE) The Xpert Xpress SARS-CoV-2/FLU/RSV plus assay is intended as an aid in the diagnosis of influenza from Nasopharyngeal swab specimens and should not be used as a sole basis for treatment. Nasal washings and aspirates are  unacceptable for Xpert Xpress SARS-CoV-2/FLU/RSV testing.  Fact Sheet for Patients: EntrepreneurPulse.com.au  Fact Sheet for Healthcare Providers: IncredibleEmployment.be  This test is not yet approved or cleared by the [place] FDA and has been authorized for detection and/or diagnosis of SARS-CoV-2 by FDA under an Emergency Use Authorization (EUA). This EUA will remain in effect (meaning this test can be used) for the duration of the COVID-19 declaration under Section 564(b)(1) of the Act, 21 U.S.C. section 360bbb-3(b)(1), unless the authorization is terminated or revoked.  Performed at [facility], [place] [address] [place], [place] [address]   Hemoglobin and hematocrit, blood     Status: Abnormal   Collection Time: 07/02/20  4:00 PM  Result Value Ref Range   Hemoglobin 7.7 (L) 12.0 - 15.0 g/dL   HCT 24.3 (L) 36 - 46 %    Comment: Performed at [facility], [place] [address] [place], [place] [address]   CT CHEST WO CONTRAST  Result Date: 07/02/2020 CLINICAL DATA:  Shortness of breath, respiratory failure, lung cancer EXAM: CT CHEST WITHOUT CONTRAST TECHNIQUE: Multidetector CT imaging of the chest was performed following the standard protocol without IV contrast. COMPARISON:  02/26/2020 FINDINGS: Cardiovascular: Aortic atherosclerosis. Normal heart size. Three-vessel coronary artery calcifications and/or stents. No pericardial effusion. Mediastinum/Nodes: No enlarged mediastinal, hilar, or axillary lymph nodes. Thyroid gland, trachea, and esophagus demonstrate no significant findings. Lungs/Pleura: New small bilateral pleural effusions with associated atelectasis or consolidation. There is increased consolidation of a treated mass of the dependent right lower  lobe, measuring approximately 3.3 x 2.5 cm. There are new ground-glass opacities the lower lobes (series 5, 82). Redemonstrated bandlike scarring or  atelectasis of dependent right lower lobe (series 5). Upper Abdomen: No acute abnormality. Musculoskeletal: No chest wall mass or suspicious bone lesions identified. IMPRESSION: 1. New small bilateral pleural effusions with associated atelectasis or consolidation. New ground-glass opacities of the lower lobes. These findings are generally nonspecific and infectious or inflammatory, differential considerations including infection, drug toxicity, and or radiation pneumonitis. 2. There is increased consolidation of a treated mass of the dependent right lower lobe, consistent with evolving post treatment change. 3. Coronary artery disease. Aortic Atherosclerosis (ICD10-I70.0). Electronically Signed   By: Eddie Candle M.D.   On: 07/02/2020 13:40   DG Chest Port 1 View  Result Date: 07/02/2020 CLINICAL DATA:  Shortness of breath. EXAM: PORTABLE CHEST 1 VIEW COMPARISON:  11/10/2019 FINDINGS: Patient slightly rotated to the left. Sternotomy wires are intact. Lungs are adequately inflated with subtle stable density over the lateral right infrahilar region. Minimal blunting of the left costophrenic angle which may be due to parenchymal scarring or small amount of pleural fluid. Cardiomediastinal silhouette and remainder of the exam is unchanged. IMPRESSION: Minimal blunting of the left costophrenic angle which may be due to parenchymal scarring or small amount of pleural fluid. Stable chronic change right infrahilar region. Electronically Signed   By: Marin Olp M.D.   On: 07/02/2020 08:36    Pending Labs Unresulted Labs (From admission, onward)          Start     Ordered   07/03/20 0500  Comprehensive metabolic panel  Tomorrow morning,   R        07/02/20 1458   07/02/20 1600  Hemoglobin and hematocrit, blood  Now then every 8 hours,   R (with STAT occurrences)      07/02/20 1458          Vitals/Pain Today's Vitals   07/02/20 1715 07/02/20 1730 07/02/20 1745 07/02/20 1800  BP: (!) 156/55 (!)  162/47 (!) 151/41 (!) 144/39  Pulse: (!) 55 (!) 52 (!) 50 (!) 51  Resp: 19 20 (!) 26 14  Temp:      TempSrc:      SpO2: 95% 95% 95% 96%  PainSc:        Isolation Precautions No active isolations  Medications Medications  aspirin EC tablet 81 mg (has no administration in time range)  amiodarone (PACERONE) tablet 100 mg (has no administration in time range)  spironolactone (ALDACTONE) tablet 25 mg (has no administration in time range)  rosuvastatin (CRESTOR) tablet 10 mg (has no administration in time range)  amLODipine-olmesartan (AZOR) 10-40 MG per tablet 1 tablet (has no administration in time range)  pantoprazole (PROTONIX) EC tablet 40 mg (40 mg Oral Given 07/02/20 1608)  clopidogrel (PLAVIX) tablet 75 mg (has no administration in time range)  albuterol (VENTOLIN HFA) 108 (90 Base) MCG/ACT inhaler 2 puff (has no administration in time range)  ondansetron (ZOFRAN-ODT) disintegrating tablet 4 mg (has no administration in time range)  acetaminophen (TYLENOL) tablet 650 mg (650 mg Oral Given 07/02/20 1233)    Or  acetaminophen (TYLENOL) suppository 650 mg ( Rectal See Alternative 07/02/20 1233)  hydrALAZINE (APRESOLINE) injection 10 mg (has no administration in time range)  Fluticasone-Umeclidin-Vilant 200-62.5-25 MCG/INH AEPB 1 puff (has no administration in time range)  sodium chloride 0.9 % bolus 1,000 mL (0 mLs Intravenous Stopped 07/02/20 0958)  0.9 %  sodium chloride infusion (Manually  program via Guardrails IV Fluids) (0 mLs Intravenous Stopped 07/02/20 1220)    Mobility walks with person assist Low fall risk   Focused Assessments Gen Mde   R Recommendations: See Admitting Provider Note  Report given to:   Additional Notes:

## 2020-07-02 NOTE — H&P (Signed)
History and Physical    TRANIECE BOFFA Mccullough:[id number] DOB: 1941-06-26 DOA: 07/02/2020  PCP: [name], MD  Patient coming from: Home  Chief Complaint: dyspnea on exertion   HPI: [name] is a 79 y.o. female with medical history significant of a fib, HTN, CAD. Presenting with dyspnea on exertion. She has had this problem for about 2 months. She's been following with her PCP on this matter and eventually was sent for endoscopy d/t worsening anemia. An EGD and c-scope were performed on 06/29/20 which found 2 sites of bleed in the stomach (hemostasis achieved with cauterization) and 2 sites of bleed in the cecum (hemostasis achieved w/ cauterization and clipping). She went home stable. She reports that in the 3 days since that procedure, her shortness of breath seems to be worse. It has progressed to the point that this morning after walking back from the bathroom to her room, she had to hold on to the wall and had a long rest/recovery period. She became concerned and came to the ED.    ED Course: She was given 1 unit pRBCs. CXR negative for acute process. EDP spoke with [facility]. They will follow. [facility] was called for admission.   Review of Systems:  Denies CP, palpitations, syncopal episodes, vomiting/diarrhea/fevers. Reports nausea, blood from rectum. Review of systems is otherwise negative for all not mentioned in HPI.   PMHx Past Medical History:  Diagnosis Date  . 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 Asthma   . 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  . Blood in stool    last week of aug 2018  . Carotid artery occlusion   . Complication of anesthesia   . COPD (chronic obstructive pulmonary disease) ([place])   . Coronary artery disease   . 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 Diverticulitis   . Dyspnea    with exertion  . Gastroesophageal reflux disease    occ  . Glaucoma    right eye  .  Headache    tension  . Headache syndrome 11/27/2018  . Hiatal hernia   . Hyperlipidemia   . Hypertension   . Iliac artery aneurysm ([place])   . 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 Myocardial infarction [facility]) 01/01/2000   Cardiac catheterization  . Peripheral vascular disease ([place])    stents in legs x 2 or 3  . Pneumonia    last time winter 2017 -2018  . PONV (postoperative nausea and vomiting)    occassionally, last colonscopy did ok with anesthesia  . Reflux   . Wears dentures    Full set  . Wears glasses     PSHx Past Surgical History:  Procedure Laterality Date  . ABDOMINAL HYSTERECTOMY    . APPENDECTOMY    . Arnold-chiari malformation repair  1998   Suboccipital craniectomy  . CAROTID ENDARTERECTOMY  03/29/2010   Left  CEA  . CHOLECYSTECTOMY     Gall Bladder  . COLONOSCOPY WITH PROPOFOL N/A 04/22/2015   Procedure: COLONOSCOPY WITH PROPOFOL;  Surgeon: Carol Ada, MD;  Location: [facility];  Service: Endoscopy;  Laterality: N/A;  . COLONOSCOPY WITH PROPOFOL N/A 05/25/2016   Procedure: COLONOSCOPY WITH PROPOFOL;  Surgeon: Carol Ada, MD;  Location: [facility];  Service: Endoscopy;  Laterality: N/A;  . COLONOSCOPY WITH PROPOFOL N/A 05/03/2017   Procedure: COLONOSCOPY WITH PROPOFOL;  Surgeon: Benson Norway,  Saralyn Pilar, MD;  Location: [facility];  Service: Endoscopy;  Laterality: N/A;  . CORNEAL TRANSPLANT     Right  . CORONARY ARTERY BYPASS GRAFT  01/01/2000   x 3  . ENTEROSCOPY N/A 02/27/2018   Procedure: ENTEROSCOPY;  Surgeon: Carol Ada, MD;  Location: [facility];  Service: Endoscopy;  Laterality: N/A;  . ENTEROSCOPY N/A 07/18/2018   Procedure: ENTEROSCOPY;  Surgeon: Carol Ada, MD;  Location: [facility];  Service: Endoscopy;  Laterality: N/A;  . ESOPHAGOGASTRODUODENOSCOPY N/A 05/25/2016   Procedure: ESOPHAGOGASTRODUODENOSCOPY (EGD);  Surgeon: Carol Ada, MD;  Location: [facility];  Service: Endoscopy;  Laterality: N/A;   . ESOPHAGOGASTRODUODENOSCOPY N/A 08/01/2018   Procedure: ESOPHAGOGASTRODUODENOSCOPY (EGD);  Surgeon: Carol Ada, MD;  Location: [facility];  Service: Endoscopy;  Laterality: N/A;  . EYE SURGERY Right 1995 or 1996   Laser surgery for retinal hemorrhage  . GIVENS CAPSULE STUDY N/A 07/16/2018   Procedure: GIVENS CAPSULE STUDY;  Surgeon: Carol Ada, MD;  Location: [facility];  Service: Endoscopy;  Laterality: N/A;  . HOT HEMOSTASIS N/A 02/27/2018   Procedure: HOT HEMOSTASIS (ARGON PLASMA COAGULATION/BICAP);  Surgeon: Carol Ada, MD;  Location: [facility];  Service: Endoscopy;  Laterality: N/A;  . HOT HEMOSTASIS N/A 08/01/2018   Procedure: HOT HEMOSTASIS (ARGON PLASMA COAGULATION/BICAP);  Surgeon: Carol Ada, MD;  Location: [facility];  Service: Endoscopy;  Laterality: N/A;  . IR RADIOLOGIST EVAL & MGMT  12/14/2016  . LEFT HEART CATH AND CORS/GRAFTS ANGIOGRAPHY N/A 01/09/2018   Procedure: LEFT HEART CATH AND CORS/GRAFTS ANGIOGRAPHY;  Surgeon: Charolette Forward, MD;  Location: [facility];  Service: Cardiovascular;  Laterality: N/A;  . LEFT HEART CATHETERIZATION WITH CORONARY ANGIOGRAM N/A 08/03/2014   Procedure: LEFT HEART CATHETERIZATION WITH CORONARY ANGIOGRAM;  Surgeon: Birdie Riddle, MD;  Location: [facility];  Service: Cardiovascular;  Laterality: N/A;  . Post Coronary Artery  BPG  01/05/2000   Right jugular sheath removed  . PR VEIN BYPASS GRAFT,AORTO-FEM-POP    . ROTATOR CUFF REPAIR     Right    SocHx  reports that she quit smoking about 19 years ago. Her smoking use included cigarettes. She has a 45.00 pack-year smoking history. She has never used smokeless tobacco. She reports that she does not drink alcohol and does not use drugs.  Allergies  Allergen Reactions  . Azithromycin Swelling    Patient reported past history of lip swelling  . Codeine Other (See Comments)    Dr. Terrence Dupont advised patient not to take this medication  . Doxycycline Swelling    Mouth,  lips, feet swelling  . Hydromorphone Palpitations and Other (See Comments)    DILAUDID  -  Pt had a Heart Attack after taking Dilaudid.  . Levaquin [Levofloxacin] Other (See Comments)    Chest pressure, SOB, "pain in between shoulder blades", sweaty -as reported by patient per experience in ED this afternoon  . Vitamin D Analogs Swelling  . Avelox [Moxifloxacin Hcl In Nacl] Palpitations    Caused Heart Attack   . Oxycodone-Acetaminophen Other (See Comments)    Says it makes her feel weird  . Risedronate Other (See Comments)    Chest pain    FamHx Family History  Problem Relation Age of Onset  . Heart disease Mother        Heart Disease before age 48  . Hypertension Mother   . Hyperlipidemia Mother   . Heart attack Mother   . Clotting disorder Mother   . Heart disease Father  Heart Disease before age 67  . Heart attack Father   . Hyperlipidemia Father   . Hypertension Father   . Heart disease Brother        Heart Disease before age 86  . Hyperlipidemia Brother   . Hypertension Brother   . Clotting disorder Brother   . AAA (abdominal aortic aneurysm) Brother   . Cerebral aneurysm Sister   . Hypertension Sister   . AAA (abdominal aortic aneurysm) Sister   . Asthma Sister   . Cerebral aneurysm Brother   . Cancer Brother        Lung  . Hypertension Brother   . Heart attack Brother   . Heart disease Brother        Aneurysm of Brain  . Hypertension Brother   . Heart disease Brother   . Heart disease Brother   . Stroke Son        Aneurysm of Stomach  . AAA (abdominal aortic aneurysm) Son   . Cancer Maternal Uncle        great uncle/cancer/type unknown    Prior to Admission medications   Medication Sig Start Date End Date Taking? Authorizing Provider  acetaminophen (TYLENOL) 325 MG tablet Take 650 mg by mouth every 6 (six) hours as needed for moderate pain or headache.    Yes [provider]  albuterol (PROVENTIL) (2.5 MG/3ML) 0.083% nebulizer solution  Take 3 mLs (2.5 mg total) by nebulization every 6 (six) hours as needed for wheezing or shortness of breath. 05/25/20  Yes Icard, Bradley L, DO  albuterol (VENTOLIN HFA) 108 (90 Base) MCG/ACT inhaler Inhale 2 puffs into the lungs every 6 (six) hours as needed for wheezing or shortness of breath. 05/25/20  Yes Icard, Octavio Graves, DO  amiodarone (PACERONE) 200 MG tablet Take 100 mg by mouth daily. 05/25/20  Yes [provider]  amLODipine-olmesartan (AZOR) 10-40 MG tablet Take 1 tablet by mouth daily.    Yes [provider]  aspirin EC 81 MG tablet Take 81 mg by mouth daily. Swallow whole.   Yes [provider]  cetirizine (ZYRTEC) 10 MG tablet Take 10 mg by mouth daily as needed for allergies.    Yes [provider]  clopidogrel (PLAVIX) 75 MG tablet Take 1 tablet (75 mg total) by mouth daily. 04/02/12  Yes Conrad [place], MD  dexlansoprazole (DEXILANT) 60 MG capsule Take 1 capsule (60 mg total) by mouth daily before breakfast. 11/17/12  Yes Tanda Rockers, MD  Fluticasone-Umeclidin-Vilant (TRELEGY ELLIPTA) 200-62.5-25 MCG/INH AEPB Inhale 1 puff into the lungs daily. 05/25/20  Yes Icard, Octavio Graves, DO  nitroGLYCERIN (NITROSTAT) 0.4 MG SL tablet Place 0.4 mg under the tongue every 5 (five) minutes as needed for chest pain.    Yes [provider]  ondansetron (ZOFRAN-ODT) 4 MG disintegrating tablet Take 4 mg by mouth every 8 (eight) hours as needed for nausea/vomiting. 02/17/20  Yes [provider]  rosuvastatin (CRESTOR) 10 MG tablet Take 10 mg by mouth at bedtime.    Yes [provider]  spironolactone (ALDACTONE) 25 MG tablet Take 25 mg by mouth daily. 03/25/17  Yes [provider]    Physical Exam: Vitals:   07/02/20 0616 07/02/20 0801 07/02/20 0945 07/02/20 1017  BP: (!) 190/58 (!) 157/58 (!) 144/49   Pulse: 96 (!) 52 (!) 58   Resp: 19 19 (!) 26   Temp: 98 F (36.7 C)   98 F (36.7 C)  TempSrc: Oral   Oral  SpO2: 96% 95% 93%      General: 79 y.o. female resting in bed in NAD Eyes: PERRL, normal sclera ENMT: Nares patent w/o discharge, orophaynx clear, dentition normal, ears w/o discharge/lesions/ulcers Neck: Supple, trachea midline Cardiovascular: brady regular, +S1, S2, no m/g/r, equal pulses throughout Respiratory: soft scattered exp wheeze, no w/r, increased WOB on RA GI: BS+, NDNT, no masses noted, no organomegaly noted MSK: No c/c; trace b/l pedal edema Skin: No rashes, bruises, ulcerations noted Neuro: A&O x 3, no focal deficits Psyc: Appropriate interaction and affect, calm/cooperative  Labs on Admission: I have personally reviewed following labs and imaging studies  CBC: Recent Labs  Lab 06/29/20 1047 07/02/20 0828  WBC  --  8.2  HGB 7.8* 7.4*  HCT 23.0* 24.0*  MCV  --  88.9  PLT  --  502   Basic Metabolic Panel: Recent Labs  Lab 06/29/20 1047 07/02/20 0828  NA 142 141  K 4.7 4.0  CL 105 108  CO2  --  23  GLUCOSE 86 102*  BUN 20 13  CREATININE 1.80* 1.51*  CALCIUM  --  9.0   GFR: Estimated Creatinine Clearance: 28.9 mL/min (A) (by C-G formula based on SCr of 1.51 mg/dL (H)). Liver Function Tests: Recent Labs  Lab 07/02/20 0828  AST 15  ALT 8  ALKPHOS 61  BILITOT 0.7  PROT 6.9  ALBUMIN 3.9   No results for input(s): LIPASE, AMYLASE in the last 168 hours. No results for input(s): AMMONIA in the last 168 hours. Coagulation Profile: No results for input(s): INR, PROTIME in the last 168 hours. Cardiac Enzymes: No results for input(s): CKTOTAL, CKMB, CKMBINDEX, TROPONINI in the last 168 hours. BNP (last 3 results) No results for input(s): PROBNP in the last 8760 hours. HbA1C: No results for input(s): HGBA1C in the last 72 hours. CBG: No results for input(s): GLUCAP in the last 168 hours. Lipid Profile: No results for input(s): CHOL, HDL, LDLCALC, TRIG, CHOLHDL, LDLDIRECT in the last 72 hours. Thyroid Function Tests: No results for input(s): TSH, T4TOTAL, FREET4,  T3FREE, THYROIDAB in the last 72 hours. Anemia Panel: No results for input(s): VITAMINB12, FOLATE, FERRITIN, TIBC, IRON, RETICCTPCT in the last 72 hours. Urine analysis:    Component Value Date/Time   COLORURINE YELLOW 02/24/2016 1905   APPEARANCEUR CLEAR 02/24/2016 1905   LABSPEC 1.019 02/24/2016 1905   PHURINE 5.5 02/24/2016 1905   GLUCOSEU NEGATIVE 02/24/2016 1905   HGBUR NEGATIVE 02/24/2016 1905   BILIRUBINUR NEGATIVE 02/24/2016 1905   KETONESUR NEGATIVE 02/24/2016 1905   PROTEINUR NEGATIVE 02/24/2016 1905   UROBILINOGEN 0.2 04/02/2013 0232   NITRITE NEGATIVE 02/24/2016 1905   LEUKOCYTESUR NEGATIVE 02/24/2016 1905    Radiological Exams on Admission: DG Chest Port 1 View  Result Date: 07/02/2020 CLINICAL DATA:  Shortness of breath. EXAM: PORTABLE CHEST 1 VIEW COMPARISON:  11/10/2019 FINDINGS: Patient slightly rotated to the left. Sternotomy wires are intact. Lungs are adequately inflated with subtle stable density over the lateral right infrahilar region. Minimal blunting of the left costophrenic angle which may be due to parenchymal scarring or small amount of pleural fluid. Cardiomediastinal silhouette and remainder of the exam is unchanged. IMPRESSION: Minimal blunting of the left costophrenic angle which may be due to parenchymal scarring or small amount of pleural fluid. Stable chronic change right infrahilar region. Electronically Signed   By: Marin Olp M.D.   On: 07/02/2020 08:36    EKG: Independently reviewed. Sinus brady; no ST changes  Assessment/Plan Recurrent GIB DOE Symptomatic anemia     -  admit to obs, tele     - [facility] to see     - recent c-sope/EGD; 2 bleeds in stomach that were cauterized and 2 bleeds in the cecum that were cauterized and clipped     - she remains short of breath w/ DOE upon presentation     - Hgb is pretty stable from 11/17: 7.8 -> 7.4; 1 unit pRBCs ordered by ED     - let's also check BNP as she states that her cardiology doc took her  off her "water pill" 1 month ago; she has trace b/l pedal edema     - also check a d-dimer; if positive, can look at V/Q; if she happens to have a clot, we are going to have difficulty with anticoagulation     - she also has a history of lung cancer; check CT chest (non-con d/t renal function)  HTN     - resume home azor  GERD     - protonix  A fib     - on amiodarone; recently had to decrease dose     - currently bradycardic     - hold today's amiodarone and reassess for use in the AM  COPD     - continue trelegy ellipta  HLD     - continue crestor  CKD3b     - at baseline     - continue current medications; watch nephrotoxins  Hx of lung CA     - follows w/ Dr. Julien Nordmann; not currently undergoing treatment  Hx of CAD     - she is on DAPT, RvB conversation needs to be had between her and cardiology     - continue statin  DVT prophylaxis: SCDs  Code Status: DNR  Family Communication: W/ Dtr at bedside  Consults called: EDP spoke with GI ([facility] oncall)  Status is: Observation  The patient remains OBS appropriate and will d/c before 2 midnights.  Dispo: The patient is from: Home              Anticipated d/c is to: Home              Anticipated d/c date is: 1 day              Patient currently is not medically stable to d/c.  Jonnie Finner DO [facility]  If 7PM-7AM, please contact night-coverage [email or web address]  07/02/2020, 10:35 AM

## 2020-07-02 NOTE — ED Notes (Signed)
Patient observed for first 15 minutes of blood transfusion. No sign or symptoms of blood transfusion. Will continue to monitor the patient.

## 2020-07-02 NOTE — ED Triage Notes (Signed)
Patient here from home reporting rectal bleeding for weeks, bright red. Recent endoscopy Wednesday. Now reports bright red blood in sputum. States that "blood counts were low on Wednesday".

## 2020-07-02 NOTE — Consult Note (Addendum)
Referring Provider: EDP Primary Care Physician:  [name], MD Primary Gastroenterologist:  Dr. Benson Norway  Reason for Consultation:  GI bleed, heme positive stool, anemia  HPI: [name] is a 79 y.o. female with PMH significant for a fib, HTN, CAD on Plavix, lung cancer for which she follows with Dr. Earlie Server (not currently on treatment).  She presented to [facility] ED today with complaints of SOB and black stools.  Hgb found to be 7.4 grams down from 7.8 grams just a couple of days ago.  She has received one unit of PRBCs.  Just had EGD and colonoscopy by Dr. Benson Norway 3 days ago.  Colonoscopy 06/29/20 showed the following: - Two bleeding colonic angiodysplastic lesions. Treated with a monopolar probe. Clips (MR unsafe) were placed.  EGD/enteroscopy on 06/29/20 showed the following: - Two recently bleeding angiodysplastic lesions in the stomach. Treated with a monopolar probe.  Past Medical History:  Diagnosis Date  . 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 Asthma   . 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  . Blood in stool    last week of aug 2018  . Carotid artery occlusion   . Complication of anesthesia   . COPD (chronic obstructive pulmonary disease) ([place])   . Coronary artery disease   . 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 Diverticulitis   . Dyspnea    with exertion  . Gastroesophageal reflux disease    occ  . Glaucoma    right eye  . Headache    tension  . Headache syndrome 11/27/2018  . Hiatal hernia   . Hyperlipidemia   . Hypertension   . Iliac artery aneurysm ([place])   . Lung cancer ([place]) dx 2018   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  . Myocardial infarction [facility]) 01/01/2000   Cardiac catheterization  . Peripheral vascular disease ([place])    stents in legs x 2 or 3  . Pneumonia    last time winter 2017 -2018  . PONV (postoperative nausea and vomiting)     occassionally, last colonscopy did ok with anesthesia  . Reflux   . Wears dentures    Full set  . Wears glasses     Past Surgical History:  Procedure Laterality Date  . ABDOMINAL HYSTERECTOMY    . APPENDECTOMY    . Arnold-chiari malformation repair  1998   Suboccipital craniectomy  . CAROTID ENDARTERECTOMY  03/29/2010   Left  CEA  . CHOLECYSTECTOMY     Gall Bladder  . COLONOSCOPY WITH PROPOFOL N/A 04/22/2015   Procedure: COLONOSCOPY WITH PROPOFOL;  Surgeon: Carol Ada, MD;  Location: [facility];  Service: Endoscopy;  Laterality: N/A;  . COLONOSCOPY WITH PROPOFOL N/A 05/25/2016   Procedure: COLONOSCOPY WITH PROPOFOL;  Surgeon: Carol Ada, MD;  Location: [facility];  Service: Endoscopy;  Laterality: N/A;  . COLONOSCOPY WITH PROPOFOL N/A 05/03/2017   Procedure: COLONOSCOPY WITH PROPOFOL;  Surgeon: Carol Ada, MD;  Location: [facility];  Service: Endoscopy;  Laterality: N/A;  . CORNEAL TRANSPLANT     Right  . CORONARY ARTERY BYPASS GRAFT  01/01/2000   x 3  . ENTEROSCOPY N/A 02/27/2018   Procedure: ENTEROSCOPY;  Surgeon: Carol Ada, MD;  Location: [facility];  Service: Endoscopy;  Laterality: N/A;  . ENTEROSCOPY N/A 07/18/2018   Procedure: ENTEROSCOPY;  Surgeon: Carol Ada, MD;  Location: [facility];  Service: Endoscopy;  Laterality: N/A;  . ESOPHAGOGASTRODUODENOSCOPY N/A  05/25/2016   Procedure: ESOPHAGOGASTRODUODENOSCOPY (EGD);  Surgeon: Carol Ada, MD;  Location: [facility];  Service: Endoscopy;  Laterality: N/A;  . ESOPHAGOGASTRODUODENOSCOPY N/A 08/01/2018   Procedure: ESOPHAGOGASTRODUODENOSCOPY (EGD);  Surgeon: Carol Ada, MD;  Location: [facility];  Service: Endoscopy;  Laterality: N/A;  . EYE SURGERY Right 1995 or 1996   Laser surgery for retinal hemorrhage  . GIVENS CAPSULE STUDY N/A 07/16/2018   Procedure: GIVENS CAPSULE STUDY;  Surgeon: Carol Ada, MD;  Location: [facility];  Service: Endoscopy;  Laterality: N/A;  . HOT HEMOSTASIS N/A 02/27/2018    Procedure: HOT HEMOSTASIS (ARGON PLASMA COAGULATION/BICAP);  Surgeon: Carol Ada, MD;  Location: [facility];  Service: Endoscopy;  Laterality: N/A;  . HOT HEMOSTASIS N/A 08/01/2018   Procedure: HOT HEMOSTASIS (ARGON PLASMA COAGULATION/BICAP);  Surgeon: Carol Ada, MD;  Location: [facility];  Service: Endoscopy;  Laterality: N/A;  . IR RADIOLOGIST EVAL & MGMT  12/14/2016  . LEFT HEART CATH AND CORS/GRAFTS ANGIOGRAPHY N/A 01/09/2018   Procedure: LEFT HEART CATH AND CORS/GRAFTS ANGIOGRAPHY;  Surgeon: Charolette Forward, MD;  Location: [facility];  Service: Cardiovascular;  Laterality: N/A;  . LEFT HEART CATHETERIZATION WITH CORONARY ANGIOGRAM N/A 08/03/2014   Procedure: LEFT HEART CATHETERIZATION WITH CORONARY ANGIOGRAM;  Surgeon: Birdie Riddle, MD;  Location: [facility];  Service: Cardiovascular;  Laterality: N/A;  . Post Coronary Artery  BPG  01/05/2000   Right jugular sheath removed  . PR VEIN BYPASS GRAFT,AORTO-FEM-POP    . ROTATOR CUFF REPAIR     Right    Prior to Admission medications   Medication Sig Start Date End Date Taking? Authorizing Provider  acetaminophen (TYLENOL) 325 MG tablet Take 650 mg by mouth every 6 (six) hours as needed for moderate pain or headache.    Yes [provider]  albuterol (PROVENTIL) (2.5 MG/3ML) 0.083% nebulizer solution Take 3 mLs (2.5 mg total) by nebulization every 6 (six) hours as needed for wheezing or shortness of breath. 05/25/20  Yes Icard, Bradley L, DO  albuterol (VENTOLIN HFA) 108 (90 Base) MCG/ACT inhaler Inhale 2 puffs into the lungs every 6 (six) hours as needed for wheezing or shortness of breath. 05/25/20  Yes Icard, Octavio Graves, DO  amiodarone (PACERONE) 200 MG tablet Take 100 mg by mouth daily. 05/25/20  Yes [provider]  amLODipine-olmesartan (AZOR) 10-40 MG tablet Take 1 tablet by mouth daily.    Yes [provider]  aspirin EC 81 MG tablet Take 81 mg by mouth daily. Swallow whole.   Yes [provider]  cetirizine (ZYRTEC) 10 MG tablet Take 10 mg by mouth daily as needed for allergies.    Yes [provider]  clopidogrel (PLAVIX) 75 MG tablet Take 1 tablet (75 mg total) by mouth daily. 04/02/12  Yes Conrad Henderson, MD  dexlansoprazole (DEXILANT) 60 MG capsule Take 1 capsule (60 mg total) by mouth daily before breakfast. 11/17/12  Yes Tanda Rockers, MD  Fluticasone-Umeclidin-Vilant (TRELEGY ELLIPTA) 200-62.5-25 MCG/INH AEPB Inhale 1 puff into the lungs daily. 05/25/20  Yes Icard, Octavio Graves, DO  nitroGLYCERIN (NITROSTAT) 0.4 MG SL tablet Place 0.4 mg under the tongue every 5 (five) minutes as needed for chest pain.    Yes [provider]  ondansetron (ZOFRAN-ODT) 4 MG disintegrating tablet Take 4 mg by mouth every 8 (eight) hours as needed for nausea/vomiting. 02/17/20  Yes [provider]  rosuvastatin (CRESTOR) 10 MG tablet Take 10 mg by mouth at bedtime.    Yes [provider]  spironolactone (ALDACTONE) 25 MG tablet Take 25 mg by mouth daily. 03/25/17  Yes [provider]    Current Facility-Administered Medications  Medication Dose Route Frequency Provider Last Rate Last Admin  . acetaminophen (TYLENOL) tablet 650 mg  650 mg Oral Q6H PRN Marylyn Ishihara, Tyrone A, DO   650 mg at 07/02/20 1233   Or  . acetaminophen (TYLENOL) suppository 650 mg  650 mg Rectal Q6H PRN Marylyn Ishihara, Tyrone A, DO      . hydrALAZINE (APRESOLINE) injection 10 mg  10 mg Intravenous Q8H PRN Marylyn Ishihara, Tyrone A, DO      . ondansetron (ZOFRAN-ODT) disintegrating tablet 4 mg  4 mg Oral Q8H PRN Cherylann Ratel A, DO       Current Outpatient Medications  Medication Sig Dispense Refill  . acetaminophen (TYLENOL) 325 MG tablet Take 650 mg by mouth every 6 (six) hours as needed for moderate pain or headache.     . albuterol (PROVENTIL) (2.5 MG/3ML) 0.083% nebulizer solution Take 3 mLs (2.5 mg total) by nebulization every 6 (six) hours as needed for wheezing or shortness of breath. 75 mL 12  .  albuterol (VENTOLIN HFA) 108 (90 Base) MCG/ACT inhaler Inhale 2 puffs into the lungs every 6 (six) hours as needed for wheezing or shortness of breath. 8 g 6  . amiodarone (PACERONE) 200 MG tablet Take 100 mg by mouth daily.    Marland Kitchen amLODipine-olmesartan (AZOR) 10-40 MG tablet Take 1 tablet by mouth daily.     Marland Kitchen aspirin EC 81 MG tablet Take 81 mg by mouth daily. Swallow whole.    . cetirizine (ZYRTEC) 10 MG tablet Take 10 mg by mouth daily as needed for allergies.     Marland Kitchen clopidogrel (PLAVIX) 75 MG tablet Take 1 tablet (75 mg total) by mouth daily. 90 tablet 3  . dexlansoprazole (DEXILANT) 60 MG capsule Take 1 capsule (60 mg total) by mouth daily before breakfast. 30 capsule 11  . Fluticasone-Umeclidin-Vilant (TRELEGY ELLIPTA) 200-62.5-25 MCG/INH AEPB Inhale 1 puff into the lungs daily. 60 each 6  . nitroGLYCERIN (NITROSTAT) 0.4 MG SL tablet Place 0.4 mg under the tongue every 5 (five) minutes as needed for chest pain.     Marland Kitchen ondansetron (ZOFRAN-ODT) 4 MG disintegrating tablet Take 4 mg by mouth every 8 (eight) hours as needed for nausea/vomiting.    . rosuvastatin (CRESTOR) 10 MG tablet Take 10 mg by mouth at bedtime.     Marland Kitchen spironolactone (ALDACTONE) 25 MG tablet Take 25 mg by mouth daily.     Facility-Administered Medications Ordered in Other Encounters  Medication Dose Route Frequency Provider Last Rate Last Admin  . promethazine (PHENERGAN) injection 6.25-12.5 mg  6.25-12.5 mg Intravenous Q15 min PRN Brennan Bailey, MD        Allergies as of 07/02/2020 - Review Complete 07/02/2020  Allergen Reaction Noted  . Azithromycin Swelling 01/23/2017  . Codeine Other (See Comments) 11/27/2011  . Doxycycline Swelling   . Hydromorphone Palpitations and Other (See Comments) 01/05/2015  . Levaquin [levofloxacin] Other (See Comments) 10/22/2015  . Vitamin d analogs Swelling 01/15/2020  . Avelox [moxifloxacin hcl in nacl] Palpitations 11/27/2011  . Oxycodone-acetaminophen Other (See Comments) 11/27/2011   . Risedronate Other (See Comments) 08/02/2014    Family History  Problem Relation Age of Onset  . Heart disease Mother        Heart Disease before age 41  . Hypertension Mother   . Hyperlipidemia Mother   . Heart attack Mother   . Clotting  disorder Mother   . Heart disease Father        Heart Disease before age 11  . Heart attack Father   . Hyperlipidemia Father   . Hypertension Father   . Heart disease Brother        Heart Disease before age 71  . Hyperlipidemia Brother   . Hypertension Brother   . Clotting disorder Brother   . AAA (abdominal aortic aneurysm) Brother   . Cerebral aneurysm Sister   . Hypertension Sister   . AAA (abdominal aortic aneurysm) Sister   . Asthma Sister   . Cerebral aneurysm Brother   . Cancer Brother        Lung  . Hypertension Brother   . Heart attack Brother   . Heart disease Brother        Aneurysm of Brain  . Hypertension Brother   . Heart disease Brother   . Heart disease Brother   . Stroke Son        Aneurysm of Stomach  . AAA (abdominal aortic aneurysm) Son   . Cancer Maternal Uncle        great uncle/cancer/type unknown    Social History   Socioeconomic History  . Marital status: Widowed    Spouse name: Not on file  . Number of children: 4  . Years of education: 7TH  . Highest education level: Not on file  Occupational History  . Not on file  Tobacco Use  . Smoking status: Former Smoker    Packs/day: 1.50    Years: 30.00    Pack years: 45.00    Types: Cigarettes    Quit date: 08/13/2000    Years since quitting: 19.8  . Smokeless tobacco: Never Used  Vaping Use  . Vaping Use: Never used  Substance and Sexual Activity  . Alcohol use: No    Alcohol/week: 0.0 standard drinks  . Drug use: No  . Sexual activity: Never  Other Topics Concern  . Not on file  Social History Narrative   Lives at home w/ her son   Right-handed   Drinks coffee and [employer]epsi daily   Social Determinants of Health   Financial Resource  Strain:   . Difficulty of Paying Living Expenses: Not on file  Food Insecurity:   . Worried About Charity fundraiser in the Last Year: Not on file  . Ran Out of Food in the Last Year: Not on file  Transportation Needs:   . Lack of Transportation (Medical): Not on file  . Lack of Transportation (Non-Medical): Not on file  Physical Activity:   . Days of Exercise per Week: Not on file  . Minutes of Exercise per Session: Not on file  Stress:   . Feeling of Stress : Not on file  Social Connections:   . Frequency of Communication with Friends and Family: Not on file  . Frequency of Social Gatherings with Friends and Family: Not on file  . Attends Religious Services: Not on file  . Active Member of Clubs or Organizations: Not on file  . Attends Archivist Meetings: Not on file  . Marital Status: Not on file  Intimate Partner Violence:   . Fear of Current or Ex-Partner: Not on file  . Emotionally Abused: Not on file  . Physically Abused: Not on file  . Sexually Abused: Not on file    Review of Systems: ROS is O/W negative except as mentioned in HPI.  Physical Exam: Vital signs in  last 24 hours: Temp:  [97.8 F (36.6 C)-98.7 F (37.1 C)] 98.7 F (37.1 C) (11/20 1215) Pulse Rate:  [51-96] 56 (11/20 1230) Resp:  [12-28] 18 (11/20 1230) BP: (131-190)/(31-105) 153/65 (11/20 1230) SpO2:  [90 %-97 %] 91 % (11/20 1230)   General:  Alert, Well-developed, well-nourished, pleasant and cooperative in NAD Head:  Normocephalic and atraumatic. Eyes:  Sclera clear, no icterus.  Conjunctiva pink. Ears:  Normal auditory acuity. Mouth:  No deformity or lesions.   Lungs:  Clear throughout to auscultation.  No wheezes, crackles, or rhonchi.  Heart:  Bradycardic; no murmurs, clicks, rubs, or gallops. Abdomen:  Soft, non-distended.  BS present.  Non-tender  Msk:  Symmetrical without gross deformities. Pulses:  Normal pulses noted. Extremities:  Without clubbing or edema. Neurologic:   Alert and oriented x 4;  grossly normal neurologically. Skin:  Intact without significant lesions or rashes. Psych:  Alert and cooperative. Normal mood and affect.  Intake/Output this shift: Total I/O In: 632 [Blood:632] Out: -   Lab Results: Recent Labs    07/02/20 0828  WBC 8.2  HGB 7.4*  HCT 24.0*  PLT 254   BMET Recent Labs    07/02/20 0828  NA 141  K 4.0  CL 108  CO2 23  GLUCOSE 102*  BUN 13  CREATININE 1.51*  CALCIUM 9.0   LFT Recent Labs    07/02/20 0828  PROT 6.9  ALBUMIN 3.9  AST 15  ALT 8  ALKPHOS 61  BILITOT 0.7   Studies/Results: DG Chest Port 1 View  Result Date: 07/02/2020 CLINICAL DATA:  Shortness of breath. EXAM: PORTABLE CHEST 1 VIEW COMPARISON:  11/10/2019 FINDINGS: Patient slightly rotated to the left. Sternotomy wires are intact. Lungs are adequately inflated with subtle stable density over the lateral right infrahilar region. Minimal blunting of the left costophrenic angle which may be due to parenchymal scarring or small amount of pleural fluid. Cardiomediastinal silhouette and remainder of the exam is unchanged. IMPRESSION: Minimal blunting of the left costophrenic angle which may be due to parenchymal scarring or small amount of pleural fluid. Stable chronic change right infrahilar region. Electronically Signed   By: Marin Olp M.D.   On: 07/02/2020 08:36   IMPRESSION:  *79 year old female with history of anemia and GI bleeding due to AVMs who just had a colonoscopy and enteroscopy 3 days ago.  Returns with complaints of SOB.  Hgb is about stable (7.4 grams compared to 7.8 grams 3 days ago).  Received one unit PRBCs.  Is heme positive. *On Plavix for history of CAD:  Plavix on hold for now. *SOB:  CT chest pending.  Has history of lung cancer for which she follows with Dr. Earlie Server.  PLAN: *May need VCE at some point. *Monitor Hgb and transfuse further prn. *Clear liquids ok for now.   Laban Emperor. Zehr  07/02/2020, 12:53 PM  GI  ATTENDING  History, laboratories, x-rays, recent endoscopy reports reviewed.  Agree with comprehensive consultation note as outlined above.  Patient has Hemoccult positive stool without acute bleeding.  Hemoglobin not significantly changed from 3 days ago.  She has had recent extensive GI work-up with therapeutic interventions.  From a GI standpoint, recommend PPI and transfuse her to the clinically appropriate hemoglobin.  No plans for repeat GI intervention short of acute overt bleeding.  Otherwise, medicine should work-up and treat her shortness of breath accordingly.  We are available if needed.  We will sign off.  Docia Chuck. Geri Seminole., M.D.  [facility]

## 2020-07-03 ENCOUNTER — Observation Stay (HOSPITAL_COMMUNITY): Payer: Medicare Other

## 2020-07-03 ENCOUNTER — Encounter (HOSPITAL_COMMUNITY): Payer: Self-pay | Admitting: Gastroenterology

## 2020-07-03 DIAGNOSIS — R0600 Dyspnea, unspecified: Secondary | ICD-10-CM | POA: Diagnosis not present

## 2020-07-03 DIAGNOSIS — I2510 Atherosclerotic heart disease of native coronary artery without angina pectoris: Secondary | ICD-10-CM | POA: Diagnosis present

## 2020-07-03 DIAGNOSIS — Z66 Do not resuscitate: Secondary | ICD-10-CM | POA: Diagnosis present

## 2020-07-03 DIAGNOSIS — R5381 Other malaise: Secondary | ICD-10-CM

## 2020-07-03 DIAGNOSIS — I10 Essential (primary) hypertension: Secondary | ICD-10-CM | POA: Diagnosis not present

## 2020-07-03 DIAGNOSIS — Z951 Presence of aortocoronary bypass graft: Secondary | ICD-10-CM

## 2020-07-03 DIAGNOSIS — Z801 Family history of malignant neoplasm of trachea, bronchus and lung: Secondary | ICD-10-CM | POA: Diagnosis not present

## 2020-07-03 DIAGNOSIS — I252 Old myocardial infarction: Secondary | ICD-10-CM | POA: Diagnosis not present

## 2020-07-03 DIAGNOSIS — Z20822 Contact with and (suspected) exposure to covid-19: Secondary | ICD-10-CM | POA: Diagnosis present

## 2020-07-03 DIAGNOSIS — Z7951 Long term (current) use of inhaled steroids: Secondary | ICD-10-CM | POA: Diagnosis not present

## 2020-07-03 DIAGNOSIS — R9389 Abnormal findings on diagnostic imaging of other specified body structures: Secondary | ICD-10-CM

## 2020-07-03 DIAGNOSIS — I130 Hypertensive heart and chronic kidney disease with heart failure and stage 1 through stage 4 chronic kidney disease, or unspecified chronic kidney disease: Secondary | ICD-10-CM | POA: Diagnosis present

## 2020-07-03 DIAGNOSIS — I5031 Acute diastolic (congestive) heart failure: Secondary | ICD-10-CM | POA: Diagnosis present

## 2020-07-03 DIAGNOSIS — Z87891 Personal history of nicotine dependence: Secondary | ICD-10-CM | POA: Diagnosis not present

## 2020-07-03 DIAGNOSIS — R195 Other fecal abnormalities: Secondary | ICD-10-CM | POA: Diagnosis present

## 2020-07-03 DIAGNOSIS — I350 Nonrheumatic aortic (valve) stenosis: Secondary | ICD-10-CM

## 2020-07-03 DIAGNOSIS — K922 Gastrointestinal hemorrhage, unspecified: Secondary | ICD-10-CM

## 2020-07-03 DIAGNOSIS — Z85118 Personal history of other malignant neoplasm of bronchus and lung: Secondary | ICD-10-CM | POA: Diagnosis not present

## 2020-07-03 DIAGNOSIS — N1832 Chronic kidney disease, stage 3b: Secondary | ICD-10-CM

## 2020-07-03 DIAGNOSIS — I739 Peripheral vascular disease, unspecified: Secondary | ICD-10-CM

## 2020-07-03 DIAGNOSIS — I480 Paroxysmal atrial fibrillation: Secondary | ICD-10-CM | POA: Diagnosis present

## 2020-07-03 DIAGNOSIS — Z7982 Long term (current) use of aspirin: Secondary | ICD-10-CM | POA: Diagnosis not present

## 2020-07-03 DIAGNOSIS — R001 Bradycardia, unspecified: Secondary | ICD-10-CM | POA: Diagnosis present

## 2020-07-03 DIAGNOSIS — D649 Anemia, unspecified: Secondary | ICD-10-CM | POA: Diagnosis not present

## 2020-07-03 DIAGNOSIS — E785 Hyperlipidemia, unspecified: Secondary | ICD-10-CM | POA: Diagnosis present

## 2020-07-03 DIAGNOSIS — J449 Chronic obstructive pulmonary disease, unspecified: Secondary | ICD-10-CM

## 2020-07-03 DIAGNOSIS — Z79899 Other long term (current) drug therapy: Secondary | ICD-10-CM | POA: Diagnosis not present

## 2020-07-03 DIAGNOSIS — I340 Nonrheumatic mitral (valve) insufficiency: Secondary | ICD-10-CM | POA: Diagnosis not present

## 2020-07-03 DIAGNOSIS — K219 Gastro-esophageal reflux disease without esophagitis: Secondary | ICD-10-CM | POA: Diagnosis present

## 2020-07-03 DIAGNOSIS — Z947 Corneal transplant status: Secondary | ICD-10-CM | POA: Diagnosis not present

## 2020-07-03 DIAGNOSIS — Z7902 Long term (current) use of antithrombotics/antiplatelets: Secondary | ICD-10-CM | POA: Diagnosis not present

## 2020-07-03 DIAGNOSIS — D62 Acute posthemorrhagic anemia: Secondary | ICD-10-CM | POA: Diagnosis present

## 2020-07-03 DIAGNOSIS — Z95828 Presence of other vascular implants and grafts: Secondary | ICD-10-CM | POA: Diagnosis not present

## 2020-07-03 LAB — TYPE AND SCREEN
ABO/RH(D): A POS
Antibody Screen: NEGATIVE
Unit division: 0

## 2020-07-03 LAB — ECHOCARDIOGRAM COMPLETE
AR max vel: 1.69 cm2
AV Area VTI: 1.59 cm2
AV Area mean vel: 1.77 cm2
AV Mean grad: 9 mmHg
AV Peak grad: 20.6 mmHg
Ao pk vel: 2.27 m/s
Area-P 1/2: 3.14 cm2
Height: 62 in
S' Lateral: 2.8 cm
Weight: 2528 oz

## 2020-07-03 LAB — COMPREHENSIVE METABOLIC PANEL
ALT: 7 U/L (ref 0–44)
AST: 13 U/L — ABNORMAL LOW (ref 15–41)
Albumin: 3.2 g/dL — ABNORMAL LOW (ref 3.5–5.0)
Alkaline Phosphatase: 53 U/L (ref 38–126)
Anion gap: 6 (ref 5–15)
BUN: 11 mg/dL (ref 8–23)
CO2: 22 mmol/L (ref 22–32)
Calcium: 8.5 mg/dL — ABNORMAL LOW (ref 8.9–10.3)
Chloride: 110 mmol/L (ref 98–111)
Creatinine, Ser: 1.3 mg/dL — ABNORMAL HIGH (ref 0.44–1.00)
GFR, Estimated: 42 mL/min — ABNORMAL LOW (ref >60)
Glucose, Bld: 97 mg/dL (ref 70–99)
Potassium: 4.2 mmol/L (ref 3.5–5.1)
Sodium: 138 mmol/L (ref 135–145)
Total Bilirubin: 0.8 mg/dL (ref 0.3–1.2)
Total Protein: 6.1 g/dL — ABNORMAL LOW (ref 6.5–8.1)

## 2020-07-03 LAB — HEMOGLOBIN AND HEMATOCRIT, BLOOD
HCT: 24 % — ABNORMAL LOW (ref 36.0–46.0)
HCT: 26.4 % — ABNORMAL LOW (ref 36.0–46.0)
HCT: 27.7 % — ABNORMAL LOW (ref 36.0–46.0)
Hemoglobin: 7.4 g/dL — ABNORMAL LOW (ref 12.0–15.0)
Hemoglobin: 8.3 g/dL — ABNORMAL LOW (ref 12.0–15.0)
Hemoglobin: 8.6 g/dL — ABNORMAL LOW (ref 12.0–15.0)

## 2020-07-03 LAB — VITAMIN B12: Vitamin B-12: 239 pg/mL (ref 180–914)

## 2020-07-03 LAB — RETICULOCYTES
Immature Retic Fract: 25.5 % — ABNORMAL HIGH (ref 2.3–15.9)
RBC.: 3.06 MIL/uL — ABNORMAL LOW (ref 3.87–5.11)
Retic Count, Absolute: 68.5 10*3/uL (ref 19.0–186.0)
Retic Ct Pct: 2.2 % (ref 0.4–3.1)

## 2020-07-03 LAB — IRON AND TIBC
Iron: 39 ug/dL (ref 28–170)
Saturation Ratios: 10 % — ABNORMAL LOW (ref 10.4–31.8)
TIBC: 379 ug/dL (ref 250–450)
UIBC: 340 ug/dL

## 2020-07-03 LAB — BPAM RBC
Blood Product Expiration Date: 202112052359
ISSUE DATE / TIME: 202111201008
Unit Type and Rh: 6200

## 2020-07-03 LAB — FERRITIN: Ferritin: 15 ng/mL (ref 11–307)

## 2020-07-03 LAB — FOLATE: Folate: 24.7 ng/mL (ref >5.9)

## 2020-07-03 LAB — PROCALCITONIN: Procalcitonin: <0.1 ng/mL

## 2020-07-03 MED ORDER — SODIUM CHLORIDE 0.9% IV SOLUTION: Freq: Once | INTRAVENOUS | Status: DC

## 2020-07-03 MED ORDER — FUROSEMIDE 10 MG/ML IJ SOLN
40.0000 mg | Freq: Every day | INTRAMUSCULAR | Status: DC
  Administered 2020-07-03: 40 mg via INTRAVENOUS
  Filled 2020-07-03: qty 4

## 2020-07-03 MED ORDER — IPRATROPIUM-ALBUTEROL 0.5-2.5 (3) MG/3ML IN SOLN
3.0000 mL | Freq: Four times a day (QID) | RESPIRATORY_TRACT | Status: DC | PRN
  Administered 2020-07-04: 3 mL via RESPIRATORY_TRACT
  Filled 2020-07-03: qty 3

## 2020-07-03 NOTE — Progress Notes (Signed)
PROGRESS NOTE  [name] WIO:[id number] DOB: [birth year]   PCP: [name], MD  Patient is from: Home  DOA: 07/02/2020 LOS: 0  Chief complaints: Dyspnea on exertion  Brief Narrative / Interim history: 79 year old female with history of PAD, CAD/CABG, CKD, A. fib not on AC and HTN presenting with dyspnea on exertion and admitted for symptomatic anemia.  Recently evaluated for GI bleed with EGD and colonoscopy on 06/29/2020.  EGD revealed 2 sites of bleeding in the stomach that were cauterized.  Colonoscopy revealed 2 sites of bleeding in the cecum that were cauterized and clipped.   In ED, hemodynamically stable.  Hgb 7.4 (from 7.8 three days prior).  CXR without acute finding.  GI consulted.  She was transfused a unit of blood.  Admitted for symptomatic anemia.  CT chest without contrast with bilateral lower lobe groundglass opacities.  BNP 412.  Subjective: Seen and examined earlier this morning.  Continues to endorse dyspnea on exertion and productive cough with whitish phlegm.  Reports frequent sneezing but denies fever or chills.  She has bowel movement this am.  Denies melena or hematochezia.  Denies chest pain, lightheadedness, nausea or vomiting.  Denies abdominal pain.  Objective: Vitals:   07/03/20 0331 07/03/20 0739 07/03/20 0810 07/03/20 1232  BP: (!) 125/42  (!) 141/52 (!) 167/44  Pulse: (!) 51  (!) 52 (!) 56  Resp:   18 18  Temp: 98.1 F (36.7 C)  98.3 F (36.8 C) 98.2 F (36.8 C)  TempSrc:   Oral Oral  SpO2: 98% 98% 98% 98%  Weight:      Height:        Intake/Output Summary (Last 24 hours) at 07/03/2020 1234 Last data filed at 07/03/2020 4268 Gross per 24 hour  Intake --  Output 650 ml  Net -650 ml   Filed Weights   07/02/20 1848  Weight: 71.7 kg    Examination:  GENERAL: No apparent distress.  Nontoxic. HEENT: MMM.  Vision and hearing grossly intact.  NECK: Supple.  No apparent JVD.  RESP:  No IWOB.  Fair aeration bilaterally. CVS:  RRR.  Heart sounds normal.  ABD/GI/GU: BS+. Abd soft, NTND.  MSK/EXT:  Moves extremities. No apparent deformity.  Trace edema bilaterally. SKIN: no apparent skin lesion or wound NEURO: Awake, alert and oriented appropriately.  No apparent focal neuro deficit. PSYCH: Calm. Normal affect.  Procedures:  None  Microbiology summarized: TMHDQ-22, influenza and RSV PCR nonreactive.  Assessment & Plan: Dyspnea on exertion: This is multifactorial including symptomatic anemia, acute diastolic CHF and underlying COPD. She has no fever or leukocytosis to point toward infectious process.  Her DOE points toward anemia and possible CHF.  COPD could be playing some role.  PE less likely. -Treat treatable causes as below. -Check procalcitonin  Symptomatic anemia due to acute on chronic GI bleed: Hemoccult positive but no frank hematochezia or melena.  Had recent EGD and colonoscopy with 2 sites of bleeding in the stomach and cecum that were cauterized and clipped.  -Hgb 7.8 (on 11/17)> 7.4>1u> 7.7> 8.6 -Check anemia panel -Transfuse for goal Hgb of >8.0 -Hold aspirin and Plavix.  Acute diastolic CHF:   BNP elevated.  CXR with mild bilateral pleural effusion.  CT chest without contrast with groundglass opacities.  TTE with LVEF of 60 to 65%, no RWMA but G-2DD (new) -Trial of IV Lasix 40 mg daily -Continue home Aldactone. -Monitor fluid status and renal functions -Fluid restrictions -Repeat chest x-ray in a day or 2  History of PAD/CAD/CABG: Denies chest pain.  Troponin negative. -Hold aspirin and Plavix in the setting of GI bleed -Continue home statin.  Chronic COPD: She reports wheezing when she came in.  -Continue Breo Ellipta and Incruse Ellipta. -Add Respimat as needed.  Paroxysmal A. Fib: On amiodarone.  Not on anticoagulation -Continue home amiodarone 100 mg daily  Essential hypertension: BP within fair range. -Continue home amlodipine -Lasix and Aldactone as above -Hold ARB to allow room  for diuretics.  CKD-3B: Baseline Cr 1.5-1.7> 1.51 (admit)>> 1.3 -Continue monitoring.  Debility/generalized weakness -PT/OT eval  GERD: -Continue Protonix   Body mass index is 28.9 kg/m.         DVT prophylaxis:  SCDs Start: 07/02/20 1459 in the setting of GI bleed  Code Status: DNR/DNI Family Communication: Updated patient's daughter at the bedside. Status is: Observation  The patient will require care spanning > 2 midnights and should be moved to inpatient because: Unsafe d/c plan, IV treatments appropriate due to intensity of illness or inability to take PO and Inpatient level of care appropriate due to severity of illness  Dispo: The patient is from: Home              Anticipated d/c is to: Home              Anticipated d/c date is: 2 days              Patient currently is not medically stable to d/c.       Consultants:  Gastroenterology   Sch Meds:  Scheduled Meds: . sodium chloride   Intravenous Once  . amiodarone  100 mg Oral Daily  . amLODipine  10 mg Oral Daily  . fluticasone furoate-vilanterol  1 puff Inhalation Daily   And  . umeclidinium bromide  1 puff Inhalation Daily  . furosemide  40 mg Intravenous Daily  . pantoprazole  40 mg Oral Daily  . rosuvastatin  10 mg Oral QHS  . spironolactone  25 mg Oral Daily   Continuous Infusions: PRN Meds:.acetaminophen **OR** acetaminophen, hydrALAZINE, Ipratropium-Albuterol, ondansetron  Antimicrobials: Anti-infectives (From admission, onward)   None       I have personally reviewed the following labs and images: CBC: Recent Labs  Lab 07/02/20 0828 07/02/20 1600 07/03/20 0032 07/03/20 0734 07/03/20 1050  WBC 8.2  --   --   --   --   HGB 7.4* 7.7* 7.4* 8.6* 8.3*  HCT 24.0* 24.3* 24.0* 27.7* 26.4*  MCV 88.9  --   --   --   --   PLT 254  --   --   --   --    BMP &GFR Recent Labs  Lab 06/29/20 1047 07/02/20 0828 07/03/20 0032  NA 142 141 138  K 4.7 4.0 4.2  CL 105 108 110  CO2  --  23  22  GLUCOSE 86 102* 97  BUN 20 13 11   CREATININE 1.80* 1.51* 1.30*  CALCIUM  --  9.0 8.5*   Estimated Creatinine Clearance: 33.6 mL/min (A) (by C-G formula based on SCr of 1.3 mg/dL (H)). Liver & Pancreas: Recent Labs  Lab 07/02/20 0828 07/03/20 0032  AST 15 13*  ALT 8 7  ALKPHOS 61 53  BILITOT 0.7 0.8  PROT 6.9 6.1*  ALBUMIN 3.9 3.2*   No results for input(s): LIPASE, AMYLASE in the last 168 hours. No results for input(s): AMMONIA in the last 168 hours. Diabetic: No results for input(s): HGBA1C in the last 72  hours. No results for input(s): GLUCAP in the last 168 hours. Cardiac Enzymes: No results for input(s): CKTOTAL, CKMB, CKMBINDEX, TROPONINI in the last 168 hours. No results for input(s): PROBNP in the last 8760 hours. Coagulation Profile: No results for input(s): INR, PROTIME in the last 168 hours. Thyroid Function Tests: No results for input(s): TSH, T4TOTAL, FREET4, T3FREE, THYROIDAB in the last 72 hours. Lipid Profile: No results for input(s): CHOL, HDL, LDLCALC, TRIG, CHOLHDL, LDLDIRECT in the last 72 hours. Anemia Panel: Recent Labs    07/03/20 1050  VITAMINB12 239  FERRITIN 15  TIBC 379  IRON 39  RETICCTPCT 2.2   Urine analysis:    Component Value Date/Time   COLORURINE YELLOW 02/24/2016 1905   APPEARANCEUR CLEAR 02/24/2016 1905   LABSPEC 1.019 02/24/2016 1905   PHURINE 5.5 02/24/2016 1905   GLUCOSEU NEGATIVE 02/24/2016 1905   HGBUR NEGATIVE 02/24/2016 1905   BILIRUBINUR NEGATIVE 02/24/2016 1905   KETONESUR NEGATIVE 02/24/2016 1905   PROTEINUR NEGATIVE 02/24/2016 1905   UROBILINOGEN 0.2 04/02/2013 0232   NITRITE NEGATIVE 02/24/2016 1905   LEUKOCYTESUR NEGATIVE 02/24/2016 1905   Sepsis Labs: Invalid input(s): PROCALCITONIN, [place]  Microbiology: Recent Results (from the past 240 hour(s))  SARS CORONAVIRUS 2 (TAT 6-24 HRS) Nasopharyngeal Nasopharyngeal Swab     Status: None   Collection Time: 06/27/20 12:06 PM   Specimen:  Nasopharyngeal Swab  Result Value Ref Range Status   SARS Coronavirus 2 NEGATIVE NEGATIVE Final    Comment: (NOTE) SARS-CoV-2 target nucleic acids are NOT DETECTED.  The SARS-CoV-2 RNA is generally detectable in upper and lower respiratory specimens during the acute phase of infection. Negative results do not preclude SARS-CoV-2 infection, do not rule out co-infections with other pathogens, and should not be used as the sole basis for treatment or other patient management decisions. Negative results must be combined with clinical observations, patient history, and epidemiological information. The expected result is Negative.  Fact Sheet for Patients: SugarRoll.be  Fact Sheet for Healthcare Providers: https://www.woods-mathews.com/  This test is not yet approved or cleared by the [place] FDA and  has been authorized for detection and/or diagnosis of SARS-CoV-2 by FDA under an Emergency Use Authorization (EUA). This EUA will remain  in effect (meaning this test can be used) for the duration of the COVID-19 declaration under Se ction 564(b)(1) of the Act, 21 U.S.C. section 360bbb-3(b)(1), unless the authorization is terminated or revoked sooner.  Performed at [facility], [place] [address] [place], [place] [address]   Resp Panel by RT-PCR (Flu A&B, Covid) Nasopharyngeal Swab     Status: None   Collection Time: 07/02/20 12:22 PM   Specimen: Nasopharyngeal Swab; Nasopharyngeal(NP) swabs in vial transport medium  Result Value Ref Range Status   SARS Coronavirus 2 by RT PCR NEGATIVE NEGATIVE Final    Comment: (NOTE) SARS-CoV-2 target nucleic acids are NOT DETECTED.  The SARS-CoV-2 RNA is generally detectable in upper respiratory specimens during the acute phase of infection. The lowest concentration of SARS-CoV-2 viral copies this assay can detect is 138 copies/mL. A negative result does not preclude SARS-Cov-2 infection and  should not be used as the sole basis for treatment or other patient management decisions. A negative result may occur with  improper specimen collection/handling, submission of specimen other than nasopharyngeal swab, presence of viral mutation(s) within the areas targeted by this assay, and inadequate number of viral copies(<138 copies/mL). A negative result must be combined with clinical observations, patient history, and epidemiological information. The expected result  is Negative.  Fact Sheet for Patients:  EntrepreneurPulse.com.au  Fact Sheet for Healthcare Providers:  IncredibleEmployment.be  This test is no t yet approved or cleared by the [place] FDA and  has been authorized for detection and/or diagnosis of SARS-CoV-2 by FDA under an Emergency Use Authorization (EUA). This EUA will remain  in effect (meaning this test can be used) for the duration of the COVID-19 declaration under Section 564(b)(1) of the Act, 21 U.S.C.section 360bbb-3(b)(1), unless the authorization is terminated  or revoked sooner.       Influenza A by PCR NEGATIVE NEGATIVE Final   Influenza B by PCR NEGATIVE NEGATIVE Final    Comment: (NOTE) The Xpert Xpress SARS-CoV-2/FLU/RSV plus assay is intended as an aid in the diagnosis of influenza from Nasopharyngeal swab specimens and should not be used as a sole basis for treatment. Nasal washings and aspirates are unacceptable for Xpert Xpress SARS-CoV-2/FLU/RSV testing.  Fact Sheet for Patients: EntrepreneurPulse.com.au  Fact Sheet for Healthcare Providers: IncredibleEmployment.be  This test is not yet approved or cleared by the [place] FDA and has been authorized for detection and/or diagnosis of SARS-CoV-2 by FDA under an Emergency Use Authorization (EUA). This EUA will remain in effect (meaning this test can be used) for the duration of the COVID-19 declaration  under Section 564(b)(1) of the Act, 21 U.S.C. section 360bbb-3(b)(1), unless the authorization is terminated or revoked.  Performed at [facility], [place] [address], [place], [place] [address]     Radiology Studies: CT CHEST WO CONTRAST  Result Date: 07/02/2020 CLINICAL DATA:  Shortness of breath, respiratory failure, lung cancer EXAM: CT CHEST WITHOUT CONTRAST TECHNIQUE: Multidetector CT imaging of the chest was performed following the standard protocol without IV contrast. COMPARISON:  02/26/2020 FINDINGS: Cardiovascular: Aortic atherosclerosis. Normal heart size. Three-vessel coronary artery calcifications and/or stents. No pericardial effusion. Mediastinum/Nodes: No enlarged mediastinal, hilar, or axillary lymph nodes. Thyroid gland, trachea, and esophagus demonstrate no significant findings. Lungs/Pleura: New small bilateral pleural effusions with associated atelectasis or consolidation. There is increased consolidation of a treated mass of the dependent right lower lobe, measuring approximately 3.3 x 2.5 cm. There are new ground-glass opacities the lower lobes (series 5, 82). Redemonstrated bandlike scarring or atelectasis of dependent right lower lobe (series 5). Upper Abdomen: No acute abnormality. Musculoskeletal: No chest wall mass or suspicious bone lesions identified. IMPRESSION: 1. New small bilateral pleural effusions with associated atelectasis or consolidation. New ground-glass opacities of the lower lobes. These findings are generally nonspecific and infectious or inflammatory, differential considerations including infection, drug toxicity, and or radiation pneumonitis. 2. There is increased consolidation of a treated mass of the dependent right lower lobe, consistent with evolving post treatment change. 3. Coronary artery disease. Aortic Atherosclerosis (ICD10-I70.0). Electronically Signed   By: Eddie Candle M.D.   On: 07/02/2020 13:40   ECHOCARDIOGRAM  COMPLETE  Result Date: 07/03/2020    ECHOCARDIOGRAM REPORT   Patient Name:   [name] Date of Exam: 07/03/2020 Medical Rec #:  [id number]      Height:       62.0 in Accession #:    [id number]     Weight:       158.0 lb Date of Birth:  Nov 29, 1940      BSA:          1.729 m Patient Age:    66 years       BP:           125/42 mmHg Patient Gender:  F              HR:           55 bpm. Exam Location:  Inpatient Procedure: 2D Echo, Color Doppler and Cardiac Doppler Indications:    R06.9 DOE  History:        Patient has prior history of Echocardiogram examinations, most                 recent 02/12/2019. Prior CABG, COPD; Risk Factors:Hypertension and                 Dyslipidemia.  Sonographer:    Raquel Sarna Senior RDCS Referring Phys: [id number] [place]  1. Left ventricular ejection fraction, by estimation, is 60 to 65%. The left ventricle has normal function. The left ventricle has no regional wall motion abnormalities. Left ventricular diastolic parameters are consistent with Grade II diastolic dysfunction (pseudonormalization). Elevated left ventricular end-diastolic pressure.  2. Right ventricular systolic function is normal. The right ventricular size is normal.  3. Left atrial size was mildly dilated.  4. The mitral valve is degenerative. Mild mitral valve regurgitation. No evidence of mitral stenosis.  5. The aortic valve is tricuspid. Aortic valve regurgitation is not visualized. Mild aortic valve stenosis.  6. The inferior vena cava is dilated in size with >50% respiratory variability, suggesting right atrial pressure of 8 mmHg. FINDINGS  Left Ventricle: Left ventricular ejection fraction, by estimation, is 60 to 65%. The left ventricle has normal function. The left ventricle has no regional wall motion abnormalities. The left ventricular internal cavity size was normal in size. There is  no left ventricular hypertrophy. Left ventricular diastolic parameters are consistent with Grade II diastolic  dysfunction (pseudonormalization). Elevated left ventricular end-diastolic pressure. Right Ventricle: The right ventricular size is normal. No increase in right ventricular wall thickness. Right ventricular systolic function is normal. Left Atrium: Left atrial size was mildly dilated. Right Atrium: Right atrial size was normal in size. Pericardium: There is no evidence of pericardial effusion. Mitral Valve: The mitral valve is degenerative in appearance. There is mild thickening of the mitral valve leaflet(s). There is mild calcification of the mitral valve leaflet(s). Mild mitral annular calcification. Mild mitral valve regurgitation. No evidence of mitral valve stenosis. MV peak gradient, 10.1 mmHg. The mean mitral valve gradient is 4.0 mmHg. Tricuspid Valve: The tricuspid valve is normal in structure. Tricuspid valve regurgitation is trivial. No evidence of tricuspid stenosis. Aortic Valve: The aortic valve is tricuspid. Aortic valve regurgitation is not visualized. Mild aortic stenosis is present. Aortic valve mean gradient measures 9.0 mmHg. Aortic valve peak gradient measures 20.6 mmHg. Aortic valve area, by VTI measures 1.59 cm. Pulmonic Valve: The pulmonic valve was normal in structure. Pulmonic valve regurgitation is mild. No evidence of pulmonic stenosis. Aorta: The aortic root is normal in size and structure. Venous: The inferior vena cava is dilated in size with greater than 50% respiratory variability, suggesting right atrial pressure of 8 mmHg. IAS/Shunts: No atrial level shunt detected by color flow Doppler.  LEFT VENTRICLE PLAX 2D LVIDd:         4.70 cm  Diastology LVIDs:         2.80 cm  LV e' medial:    8.27 cm/s LV PW:         1.00 cm  LV E/e' medial:  16.6 LV IVS:        1.00 cm  LV e' lateral:   5.98 cm/s LVOT diam:  1.80 cm  LV E/e' lateral: 22.9 LV SV:         87 LV SV Index:   50 LVOT Area:     2.54 cm  RIGHT VENTRICLE RV S prime:     10.00 cm/s TAPSE (M-mode): 1.8 cm LEFT ATRIUM              Index       RIGHT ATRIUM           Index LA diam:        4.10 cm 2.37 cm/m  RA Area:     12.80 cm LA Vol (A2C):   49.3 ml 28.51 ml/m RA Volume:   28.50 ml  16.48 ml/m LA Vol (A4C):   68.0 ml 39.32 ml/m LA Biplane Vol: 57.9 ml 33.48 ml/m  AORTIC VALVE AV Area (Vmax):    1.69 cm AV Area (Vmean):   1.77 cm AV Area (VTI):     1.59 cm AV Vmax:           227.00 cm/s AV Vmean:          140.000 cm/s AV VTI:            0.547 m AV Peak Grad:      20.6 mmHg AV Mean Grad:      9.0 mmHg LVOT Vmax:         151.00 cm/s LVOT Vmean:        97.200 cm/s LVOT VTI:          0.341 m LVOT/AV VTI ratio: 0.62  AORTA Ao Root diam: 2.60 cm Ao Asc diam:  3.30 cm MITRAL VALVE MV Area (PHT): 3.14 cm     SHUNTS MV Peak grad:  10.1 mmHg    Systemic VTI:  0.34 m MV Mean grad:  4.0 mmHg     Systemic Diam: 1.80 cm MV Vmax:       1.59 m/s MV Vmean:      89.1 cm/s MV Decel Time: 242 msec MV E velocity: 137.00 cm/s MV A velocity: 110.00 cm/s MV E/A ratio:  1.25 Jenkins Rouge MD Electronically signed by Jenkins Rouge MD Signature Date/Time: 07/03/2020/9:32:13 AM    Final      Lola Lofaro T. [place]  If 7PM-7AM, please contact night-coverage [email or web address] 07/03/2020, 12:34 PM

## 2020-07-03 NOTE — Progress Notes (Signed)
Echocardiogram 2D Echocardiogram has been performed.  [name] [name] 07/03/2020, 9:09 AM

## 2020-07-04 DIAGNOSIS — D649 Anemia, unspecified: Secondary | ICD-10-CM | POA: Diagnosis not present

## 2020-07-04 DIAGNOSIS — I5031 Acute diastolic (congestive) heart failure: Secondary | ICD-10-CM | POA: Diagnosis not present

## 2020-07-04 DIAGNOSIS — I10 Essential (primary) hypertension: Secondary | ICD-10-CM

## 2020-07-04 DIAGNOSIS — R0600 Dyspnea, unspecified: Secondary | ICD-10-CM | POA: Diagnosis not present

## 2020-07-04 DIAGNOSIS — D500 Iron deficiency anemia secondary to blood loss (chronic): Secondary | ICD-10-CM

## 2020-07-04 DIAGNOSIS — D62 Acute posthemorrhagic anemia: Secondary | ICD-10-CM

## 2020-07-04 LAB — CBC
HCT: 27.9 % — ABNORMAL LOW (ref 36.0–46.0)
Hemoglobin: 8.8 g/dL — ABNORMAL LOW (ref 12.0–15.0)
MCH: 27.5 pg (ref 26.0–34.0)
MCHC: 31.5 g/dL (ref 30.0–36.0)
MCV: 87.2 fL (ref 80.0–100.0)
Platelets: 248 10*3/uL (ref 150–400)
RBC: 3.2 MIL/uL — ABNORMAL LOW (ref 3.87–5.11)
RDW: 15 % (ref 11.5–15.5)
WBC: 6.1 10*3/uL (ref 4.0–10.5)
nRBC: 0 % (ref 0.0–0.2)

## 2020-07-04 LAB — PROCALCITONIN: Procalcitonin: <0.1 ng/mL

## 2020-07-04 LAB — RENAL FUNCTION PANEL
Albumin: 3.7 g/dL (ref 3.5–5.0)
Anion gap: 12 (ref 5–15)
BUN: 13 mg/dL (ref 8–23)
CO2: 22 mmol/L (ref 22–32)
Calcium: 9.2 mg/dL (ref 8.9–10.3)
Chloride: 106 mmol/L (ref 98–111)
Creatinine, Ser: 1.45 mg/dL — ABNORMAL HIGH (ref 0.44–1.00)
GFR, Estimated: 37 mL/min — ABNORMAL LOW (ref >60)
Glucose, Bld: 88 mg/dL (ref 70–99)
Phosphorus: 3.5 mg/dL (ref 2.5–4.6)
Potassium: 3.9 mmol/L (ref 3.5–5.1)
Sodium: 140 mmol/L (ref 135–145)

## 2020-07-04 LAB — MAGNESIUM: Magnesium: 1.6 mg/dL — ABNORMAL LOW (ref 1.7–2.4)

## 2020-07-04 MED ORDER — MAGNESIUM SULFATE 2 GM/50ML IV SOLN
2.0000 g | Freq: Once | INTRAVENOUS | Status: AC
  Administered 2020-07-04: 2 g via INTRAVENOUS
  Filled 2020-07-04: qty 50

## 2020-07-04 MED ORDER — SODIUM CHLORIDE 0.9 % IV SOLN
510.0000 mg | Freq: Once | INTRAVENOUS | Status: AC
  Administered 2020-07-04: 510 mg via INTRAVENOUS
  Filled 2020-07-04: qty 510

## 2020-07-04 MED ORDER — SPIRONOLACTONE 25 MG PO TABS
12.5000 mg | ORAL_TABLET | Freq: Every day | ORAL | 1 refills | Status: DC
Start: 2020-07-04 — End: 2021-01-05

## 2020-07-04 MED ORDER — SPIRONOLACTONE 12.5 MG HALF TABLET
12.5000 mg | ORAL_TABLET | Freq: Every day | ORAL | Status: DC
  Administered 2020-07-04: 12.5 mg via ORAL
  Filled 2020-07-04: qty 1

## 2020-07-04 MED ORDER — CLOPIDOGREL BISULFATE 75 MG PO TABS: 75.0000 mg | ORAL_TABLET | Freq: Every day | ORAL | 3 refills | Status: DC

## 2020-07-04 MED ORDER — DOCUSATE SODIUM 100 MG PO CAPS: 100.0000 mg | ORAL_CAPSULE | Freq: Two times a day (BID) | ORAL | 2 refills | Status: DC | PRN

## 2020-07-04 MED ORDER — FERROUS SULFATE 325 (65 FE) MG PO TBEC: 325.0000 mg | DELAYED_RELEASE_TABLET | Freq: Two times a day (BID) | ORAL | 3 refills | Status: DC

## 2020-07-04 MED ORDER — FUROSEMIDE 20 MG PO TABS: 20.0000 mg | ORAL_TABLET | Freq: Every day | ORAL | 1 refills | Status: DC

## 2020-07-04 MED ORDER — OLMESARTAN MEDOXOMIL 20 MG PO TABS: 20.0000 mg | ORAL_TABLET | Freq: Every day | ORAL | 1 refills | Status: DC

## 2020-07-04 MED ORDER — POLYETHYLENE GLYCOL 3350 17 GM/SCOOP PO POWD: 17.0000 g | Freq: Two times a day (BID) | ORAL | 1 refills | Status: DC | PRN

## 2020-07-04 MED ORDER — FUROSEMIDE 10 MG/ML IJ SOLN
20.0000 mg | Freq: Every day | INTRAMUSCULAR | Status: DC
  Administered 2020-07-04: 20 mg via INTRAVENOUS
  Filled 2020-07-04: qty 2

## 2020-07-04 NOTE — Progress Notes (Signed)
Pt to be discharged to home this afternoon. Pt given discharge teaching including all Medications and schedules for these Medications. Pt verbalized understanding of all discharge teaching/instructions. Discharge packet with Pt at time of discharge

## 2020-07-04 NOTE — Evaluation (Signed)
Occupational Therapy Evaluation Patient Details Name: [name] MRN: [id number] DOB: [birth year] Today's Date: 07/04/2020    History of Present Illness Pt is a 79 yo female admitted with symptomatic anemai, dyspnea on exertion.  Pt was in for GI bleed on 11/17 wtih cauterization.  pt now with CHF and possibly COPD.  Pt with PMH of PAD, CAD, CABG, CKD, afib and HTN.  Pt with current Hgb of 8.8.   Clinical Impression   Pt admitted with the above diagnosis and overall is doing very well other than some SOB with exertion.  Feel pt would benefit from cont OT while on acute care to review energy conservation techniques and how the apply to adls and work to get to modified independent level with adls. Pt has family that are supportive and with her at all times.       Follow Up Recommendations  No OT follow up;Supervision/Assistance - 24 hour    Equipment Recommendations  None recommended by OT    Recommendations for Other Services       Precautions / Restrictions Precautions Precautions: None Restrictions Weight Bearing Restrictions: No      Mobility Bed Mobility Overal bed mobility: Modified Independent             General bed mobility comments: Pt used bedrails.    Transfers Overall transfer level: Needs assistance   Transfers: Sit to/from Stand;Stand Pivot Transfers Sit to Stand: Supervision Stand pivot transfers: Supervision       General transfer comment: Pt required cues for hand placement when using walker.    Balance Overall balance assessment: Mild deficits observed, not formally tested                                         ADL either performed or assessed with clinical judgement   ADL Overall ADL's : Needs assistance/impaired Eating/Feeding: Independent;Sitting   Grooming: Wash/dry hands;Wash/dry face;Oral care;Supervision/safety;Standing   Upper Body Bathing: Set up;Sitting   Lower Body Bathing: Supervison/ safety;Sit  to/from stand   Upper Body Dressing : Set up;Sitting   Lower Body Dressing: Supervision/safety;Sit to/from stand;Cueing for compensatory techniques Lower Body Dressing Details (indicate cue type and reason): cues to rest due to SOB Toilet Transfer: Min guard;RW;Ambulation;Comfort height toilet   Toileting- Clothing Manipulation and Hygiene: Supervision/safety;Sit to/from stand       Functional mobility during ADLs: Min guard;Rolling walker General ADL Comments: Pt doing well with adls but does become SOB.  Energy conservation techniques reviewed.     Vision Baseline Vision/History: No visual deficits Patient Visual Report: No change from baseline Vision Assessment?: No apparent visual deficits     Perception Perception Perception Tested?: No   Praxis Praxis Praxis tested?: Within functional limits    Pertinent Vitals/Pain Pain Assessment: No/denies pain     Hand Dominance Right   Extremity/Trunk Assessment Upper Extremity Assessment Upper Extremity Assessment: Overall WFL for tasks assessed   Lower Extremity Assessment Lower Extremity Assessment: Defer to PT evaluation   Cervical / Trunk Assessment Cervical / Trunk Assessment: Normal   Communication Communication Communication: No difficulties   Cognition Arousal/Alertness: Awake/alert Behavior During Therapy: WFL for tasks assessed/performed Overall Cognitive Status: Within Functional Limits for tasks assessed  General Comments  Pt moves well but just struggles with SOB when walking and completing adls.  O2 sats stayed at 95%    Exercises     Shoulder Instructions      Home Living Family/patient expects to be discharged to:: Private residence Living Arrangements: Children Available Help at Discharge: Available 24 hours/day;Family Type of Home: House Home Access: Stairs to enter [employer] of Steps: 7 Entrance Stairs-Rails: Right;Left;Can  reach both Home Layout: One level     Bathroom Shower/Tub: Walk-in shower;Door   [employer] Toilet: Standard     Home Equipment: Environmental consultant - 2 wheels;Bedside commode;Shower seat   Additional Comments: has eqiupment from husband who passed away.      Prior Functioning/Environment Level of Independence: Independent with assistive device(s)        Comments: does not drive and family assists with cleaning house         OT Problem List: Decreased activity tolerance;Impaired balance (sitting and/or standing)      OT Treatment/Interventions: Self-care/ADL training;DME and/or AE instruction;Therapeutic activities;Energy conservation    OT Goals(Current goals can be found in the care plan section) Acute Rehab OT Goals Patient Stated Goal: to breathe better OT Goal Formulation: With patient Time For Goal Achievement: 07/18/20 Potential to Achieve Goals: Good ADL Goals Pt Will Perform Lower Body Bathing: with modified independence;sit to/from stand Pt Will Perform Lower Body Dressing: with modified independence;sit to/from stand Pt Will Perform Tub/Shower Transfer: Shower transfer;shower seat;ambulating;with supervision Additional ADL Goal #1: Pt will walk to bathroom and complete all toileting with mod I.  OT Frequency: Min 2X/week   Barriers to D/C:    has assist 24/7 at home.       Co-evaluation              AM-PAC OT "6 Clicks" Daily Activity     Outcome Measure Help from another person eating meals?: None Help from another person taking care of personal grooming?: None Help from another person toileting, which includes using toliet, bedpan, or urinal?: A Little Help from another person bathing (including washing, rinsing, drying)?: A Little Help from another person to put on and taking off regular upper body clothing?: None Help from another person to put on and taking off regular lower body clothing?: A Little 6 Click Score: 21   End of Session Equipment Utilized  During Treatment: Rolling walker Nurse Communication: Mobility status  Activity Tolerance: Patient limited by fatigue Patient left: in chair;with call bell/phone within reach;with family/visitor present  OT Visit Diagnosis: Unsteadiness on feet (R26.81)                Time: 8099-8338 OT Time Calculation (min): 19 min Charges:  OT General Charges $OT Visit: 1 Visit OT Evaluation $OT Eval Moderate Complexity: 1 Mod  Glenford Peers 07/04/2020, 10:09 AM

## 2020-07-04 NOTE — Discharge Summary (Signed)
Physician Discharge Summary  NAREH MATZKE YTK:[id number] DOB: [birth year] DOA: 07/02/2020  PCP: [name], MD  Admit date: 07/02/2020 Discharge date: 07/04/2020  Admitted From: Home Disposition: Home  Recommendations for Outpatient Follow-up:  1. Follow ups as below. 2. Please obtain CBC/BMP/Mag at follow up 3. Please follow up on the following pending results: None  Home Health: None required Equipment/Devices: None required  Discharge Condition: Stable CODE STATUS: DNR/DNI   Follow-up Information    [name], MD. Schedule an appointment as soon as possible for a visit in 1 week(s).   Specialty: Family Medicine Contact information: [address] [place] [address] [phone]        Charolette Forward, MD. Schedule an appointment as soon as possible for a visit in 2 week(s).   Specialty: Cardiology Contact information: [place] [address] [phone]                Hospital Course: 79 year old female with history of PAD, CAD/CABG, CKD, A. fib not on AC and HTN presenting with dyspnea on exertion and admitted for symptomatic anemia.  Recently evaluated for GI bleed with EGD and colonoscopy on 06/29/2020.  EGD revealed 2 sites of bleeding in the stomach that were cauterized.  Colonoscopy revealed 2 sites of bleeding in the cecum that were cauterized and clipped.   In ED, hemodynamically stable.  Hgb 7.4 (from 7.8 three days prior).  CXR without acute finding.  GI consulted.  She was transfused a unit of blood.  Admitted for symptomatic anemia.  CT chest without contrast with bilateral lower lobe groundglass opacities.   Procalcitonin negative x2.  She was afebrile.  No leukocytosis. BNP 412.  TTE with LVEF of 60 to 65%, no RWMA but G-2DD (new).  Started on IV Lasix with significant improvement in her breathing.  Discharged on p.o. Lasix 20 mg daily in addition to her Aldactone.    In regards to anemia, she received 1 unit with appropriate response. Hemoglobin 8.8 at the time of discharge.  No further GI bleed.  Aspirin discontinued.  Advised to resume Plavix in 5 days.  Received IV Feraheme x1.  Discharged on p.o. ferrous sulfate.  On the day of discharge, felt well and ready to go home.  Ambulated on room air and maintained appropriate saturation.  Evaluated by therapy and no need was identified.  See individual problem list below for more on hospital course.  Discharge Diagnoses:  Dyspnea on exertion:  multifactorial including symptomatic anemia, acute diastolic CHF and underlying COPD. She has no fever or leukocytosis to point toward infectious process.  Her DOE points toward anemia and possible CHF.  COPD could be playing some role.  PE less likely.  Pro-Cal negative.  Improved after IV diuretics and blood transfusion. -Treat treatable causes as below.  Symptomatic anemia due to acute on chronic GI bleed: Hemoccult positive but no frank hematochezia or melena.  Had recent EGD and colonoscopy with 2 sites of bleeding in the stomach and cecum that were cauterized and clipped.  Anemia panel with some degree of iron deficiency.  Iron sat 10%.  Ferritin 15.  TIBC 375. -Hgb 7.8 (on 11/17)> 7.4>1u> 7.7> 8.6> 8.8 -Received IV Feraheme x1.  Discharged on p.o. ferrous sulfate twice daily with bowel regimen. -Discontinued aspirin.  Patient to resume Plavix in 4 to 5 days.  Acute diastolic CHF:   BNP elevated.  CXR with mild bilateral pleural effusion.  CT chest without contrast with groundglass opacities.  TTE with LVEF of 60 to 65%, no RWMA but G-2DD (new).  Started on IV Lasix with improvement in her breathing. -Discharged on p.o. Lasix 20 mg daily -Reduced home Aldactone to 12.5 mg daily -Reduce Benicar to 20 mg daily -Reassess fluid status, blood pressure and renal function at follow-up.  History of PAD/CAD/CABG: Denies chest pain.  Troponin negative. -Aspirin  discontinued.  Patient to resume Plavix as above. -Continue home statin.  Chronic COPD: She reports wheezing when she came in.  -Continue continue home Anoro Ellipta and rescue inhalers.  Paroxysmal A. Fib: On amiodarone.  Not on anticoagulation likely due to GI bleed. -Continue home amiodarone 100 mg daily  Essential hypertension: SBP 150.  DBP in 50s and 40s. -Discontinued home amlodipine. -Reduced home Benicar and Aldactone. -Added Lasix -Reassess at follow-up  CKD-3B: Baseline Cr 1.5-1.7> 1.51 (admit)>> 1.3> 1.45 -Recheck BMP in 1 week.  Debility/generalized weakness: Evaluated by PT/OT and no need identified.  GERD: Stable -Continue Protonix   Body mass index is 28.9 kg/m.            Discharge Exam: Vitals:   07/04/20 0733 07/04/20 0934  BP:  (!) 150/50  Pulse:    Resp:    Temp:    SpO2: 96%     GENERAL: No apparent distress.  Nontoxic. HEENT: MMM.  Vision and hearing grossly intact.  NECK: Supple.  No apparent JVD.  RESP:  No IWOB.  Fair aeration bilaterally. CVS:  RRR. Heart sounds normal.  ABD/GI/GU: Bowel sounds present. Soft. Non tender.  MSK/EXT:  Moves extremities. No apparent deformity.  Trace lower extremity edema. SKIN: no apparent skin lesion or wound NEURO: Awake, alert and oriented appropriately.  No apparent focal neuro deficit. PSYCH: Calm. Normal affect.  Discharge Instructions  Discharge Instructions    (HEART FAILURE PATIENTS) Call MD:  Anytime you have any of the following symptoms: 1) 3 pound weight gain in 24 hours or 5 pounds in 1 week 2) shortness of breath, with or without a dry hacking cough 3) swelling in the hands, feet or stomach 4) if you have to sleep on extra pillows at night in order to breathe.   Complete by: As directed    Call MD for:  difficulty breathing, headache or visual disturbances   Complete by: As directed    Call MD for:  extreme fatigue   Complete by: As directed    Call MD for:  persistant  dizziness or light-headedness   Complete by: As directed    Diet - low sodium heart healthy   Complete by: As directed    Discharge instructions   Complete by: As directed    It has been a pleasure taking care of you!  You were hospitalized due to shortness of breath with activity which could be due to you anemia and heart failure.  We gave you blood transfusion and iron infusion for anemia.  Her hemoglobin remained stable which suggested that your bleeding has subsided.  We have stopped your aspirin.  We recommend holding your Plavix for the next 4 days, and resume if there is no further bleeding.  We have also started you on Lasix (furosemide) for heart failure.  We made some adjustments to your other blood pressure medications to accommodate Lasix.  Please review your new medication list and the directions on your medications before you take them.  Please follow-up with your primary care doctor in 1 week to have  your blood pressure, blood levels, kidney number and electrolytes rechecked.  Also recommend follow-up with your cardiologist in the next 1 to 2 weeks.  In addition to taking your medications as prescribed, avoid alcohol or over-the-counter pain medication other than plain Tylenol, limit the amount of water/fluid you drink to less than 6 cups (1500 cc) a day,  limit your sodium (salt) intake to less than 2 g (2000 mg) a day and weigh yourself daily at the same time and keeping your weight log.      Take care,   Increase activity slowly   Complete by: As directed      Allergies as of 07/04/2020      Reactions   Azithromycin Swelling   Patient reported past history of lip swelling   Codeine Other (See Comments)   Dr. Terrence Dupont advised patient not to take this medication   Doxycycline Swelling   Mouth, lips, feet swelling   Hydromorphone Palpitations, Other (See Comments)   DILAUDID  -  Pt had a Heart Attack after taking Dilaudid.   Levaquin [levofloxacin] Other (See Comments)    Chest pressure, SOB, "pain in between shoulder blades", sweaty -as reported by patient per experience in ED this afternoon   Vitamin D Analogs Swelling   Avelox [moxifloxacin Hcl In Nacl] Palpitations   Caused Heart Attack    Oxycodone-acetaminophen Other (See Comments)   Says it makes her feel weird   Risedronate Other (See Comments)   Chest pain      Medication List    STOP taking these medications   amLODipine-olmesartan 10-40 MG tablet Commonly known as: AZOR   aspirin EC 81 MG tablet     TAKE these medications   acetaminophen 325 MG tablet Commonly known as: TYLENOL Take 650 mg by mouth every 6 (six) hours as needed for moderate pain or headache.   albuterol 108 (90 Base) MCG/ACT inhaler Commonly known as: VENTOLIN HFA Inhale 2 puffs into the lungs every 6 (six) hours as needed for wheezing or shortness of breath.   albuterol (2.5 MG/3ML) 0.083% nebulizer solution Commonly known as: PROVENTIL Take 3 mLs (2.5 mg total) by nebulization every 6 (six) hours as needed for wheezing or shortness of breath.   amiodarone 200 MG tablet Commonly known as: PACERONE Take 100 mg by mouth daily.   cetirizine 10 MG tablet Commonly known as: ZYRTEC Take 10 mg by mouth daily as needed for allergies.   clopidogrel 75 MG tablet Commonly known as: PLAVIX Take 1 tablet (75 mg total) by mouth daily. Start taking on: July 08, 2020 What changed: These instructions start on July 08, 2020. If you are unsure what to do until then, ask your doctor or other care provider.   dexlansoprazole 60 MG capsule Commonly known as: Dexilant Take 1 capsule (60 mg total) by mouth daily before breakfast.   furosemide 20 MG tablet Commonly known as: Lasix Take 1 tablet (20 mg total) by mouth daily.   nitroGLYCERIN 0.4 MG SL tablet Commonly known as: NITROSTAT Place 0.4 mg under the tongue every 5 (five) minutes as needed for chest pain.   olmesartan 20 MG tablet Commonly known as:  Benicar Take 1 tablet (20 mg total) by mouth daily.   ondansetron 4 MG disintegrating tablet Commonly known as: ZOFRAN-ODT Take 4 mg by mouth every 8 (eight) hours as needed for nausea/vomiting.   rosuvastatin 10 MG tablet Commonly known as: CRESTOR Take 10 mg by mouth at bedtime.   spironolactone 25 MG tablet  Commonly known as: ALDACTONE Take 0.5 tablets (12.5 mg total) by mouth daily. What changed: how much to take   Trelegy Ellipta 200-62.5-25 MCG/INH Aepb Generic drug: Fluticasone-Umeclidin-Vilant Inhale 1 puff into the lungs daily.       Consultations:  GI  Procedures/Studies:  2D Echo on 07/03/2020 1. Left ventricular ejection fraction, by estimation, is 60 to 65%. The  left ventricle has normal function. The left ventricle has no regional  wall motion abnormalities. Left ventricular diastolic parameters are  consistent with Grade II diastolic  dysfunction (pseudonormalization). Elevated left ventricular end-diastolic  pressure.  2. Right ventricular systolic function is normal. The right ventricular  size is normal.  3. Left atrial size was mildly dilated.  4. The mitral valve is degenerative. Mild mitral valve regurgitation. No  evidence of mitral stenosis.  5. The aortic valve is tricuspid. Aortic valve regurgitation is not  visualized. Mild aortic valve stenosis.  6. The inferior vena cava is dilated in size with >50% respiratory  variability, suggesting right atrial pressure of 8 mmHg.   CT CHEST WO CONTRAST  Result Date: 07/02/2020 CLINICAL DATA:  Shortness of breath, respiratory failure, lung cancer EXAM: CT CHEST WITHOUT CONTRAST TECHNIQUE: Multidetector CT imaging of the chest was performed following the standard protocol without IV contrast. COMPARISON:  02/26/2020 FINDINGS: Cardiovascular: Aortic atherosclerosis. Normal heart size. Three-vessel coronary artery calcifications and/or stents. No pericardial effusion. Mediastinum/Nodes: No enlarged  mediastinal, hilar, or axillary lymph nodes. Thyroid gland, trachea, and esophagus demonstrate no significant findings. Lungs/Pleura: New small bilateral pleural effusions with associated atelectasis or consolidation. There is increased consolidation of a treated mass of the dependent right lower lobe, measuring approximately 3.3 x 2.5 cm. There are new ground-glass opacities the lower lobes (series 5, 82). Redemonstrated bandlike scarring or atelectasis of dependent right lower lobe (series 5). Upper Abdomen: No acute abnormality. Musculoskeletal: No chest wall mass or suspicious bone lesions identified. IMPRESSION: 1. New small bilateral pleural effusions with associated atelectasis or consolidation. New ground-glass opacities of the lower lobes. These findings are generally nonspecific and infectious or inflammatory, differential considerations including infection, drug toxicity, and or radiation pneumonitis. 2. There is increased consolidation of a treated mass of the dependent right lower lobe, consistent with evolving post treatment change. 3. Coronary artery disease. Aortic Atherosclerosis (ICD10-I70.0). Electronically Signed   By: Eddie Candle M.D.   On: 07/02/2020 13:40   DG Chest Port 1 View  Result Date: 07/02/2020 CLINICAL DATA:  Shortness of breath. EXAM: PORTABLE CHEST 1 VIEW COMPARISON:  11/10/2019 FINDINGS: Patient slightly rotated to the left. Sternotomy wires are intact. Lungs are adequately inflated with subtle stable density over the lateral right infrahilar region. Minimal blunting of the left costophrenic angle which may be due to parenchymal scarring or small amount of pleural fluid. Cardiomediastinal silhouette and remainder of the exam is unchanged. IMPRESSION: Minimal blunting of the left costophrenic angle which may be due to parenchymal scarring or small amount of pleural fluid. Stable chronic change right infrahilar region. Electronically Signed   By: Marin Olp M.D.   On:  07/02/2020 08:36   ECHOCARDIOGRAM COMPLETE  Result Date: 07/03/2020    ECHOCARDIOGRAM REPORT   Patient Name:   WENDI LASTRA Date of Exam: 07/03/2020 Medical Rec #:  [id number]      Height:       62.0 in Accession #:    [id number]     Weight:       158.0 lb Date of Birth:  February 15, 1941  BSA:          1.729 m Patient Age:    79 years       BP:           125/42 mmHg Patient Gender: F              HR:           55 bpm. Exam Location:  Inpatient Procedure: 2D Echo, Color Doppler and Cardiac Doppler Indications:    R06.9 DOE  History:        Patient has prior history of Echocardiogram examinations, most                 recent 02/12/2019. Prior CABG, COPD; Risk Factors:Hypertension and                 Dyslipidemia.  Sonographer:    Raquel Sarna Senior RDCS Referring Phys: [id number] [place]  1. Left ventricular ejection fraction, by estimation, is 60 to 65%. The left ventricle has normal function. The left ventricle has no regional wall motion abnormalities. Left ventricular diastolic parameters are consistent with Grade II diastolic dysfunction (pseudonormalization). Elevated left ventricular end-diastolic pressure.  2. Right ventricular systolic function is normal. The right ventricular size is normal.  3. Left atrial size was mildly dilated.  4. The mitral valve is degenerative. Mild mitral valve regurgitation. No evidence of mitral stenosis.  5. The aortic valve is tricuspid. Aortic valve regurgitation is not visualized. Mild aortic valve stenosis.  6. The inferior vena cava is dilated in size with >50% respiratory variability, suggesting right atrial pressure of 8 mmHg. FINDINGS  Left Ventricle: Left ventricular ejection fraction, by estimation, is 60 to 65%. The left ventricle has normal function. The left ventricle has no regional wall motion abnormalities. The left ventricular internal cavity size was normal in size. There is  no left ventricular hypertrophy. Left ventricular diastolic parameters are  consistent with Grade II diastolic dysfunction (pseudonormalization). Elevated left ventricular end-diastolic pressure. Right Ventricle: The right ventricular size is normal. No increase in right ventricular wall thickness. Right ventricular systolic function is normal. Left Atrium: Left atrial size was mildly dilated. Right Atrium: Right atrial size was normal in size. Pericardium: There is no evidence of pericardial effusion. Mitral Valve: The mitral valve is degenerative in appearance. There is mild thickening of the mitral valve leaflet(s). There is mild calcification of the mitral valve leaflet(s). Mild mitral annular calcification. Mild mitral valve regurgitation. No evidence of mitral valve stenosis. MV peak gradient, 10.1 mmHg. The mean mitral valve gradient is 4.0 mmHg. Tricuspid Valve: The tricuspid valve is normal in structure. Tricuspid valve regurgitation is trivial. No evidence of tricuspid stenosis. Aortic Valve: The aortic valve is tricuspid. Aortic valve regurgitation is not visualized. Mild aortic stenosis is present. Aortic valve mean gradient measures 9.0 mmHg. Aortic valve peak gradient measures 20.6 mmHg. Aortic valve area, by VTI measures 1.59 cm. Pulmonic Valve: The pulmonic valve was normal in structure. Pulmonic valve regurgitation is mild. No evidence of pulmonic stenosis. Aorta: The aortic root is normal in size and structure. Venous: The inferior vena cava is dilated in size with greater than 50% respiratory variability, suggesting right atrial pressure of 8 mmHg. IAS/Shunts: No atrial level shunt detected by color flow Doppler.  LEFT VENTRICLE PLAX 2D LVIDd:         4.70 cm  Diastology LVIDs:         2.80 cm  LV e' medial:    8.27 cm/s  LV PW:         1.00 cm  LV E/e' medial:  16.6 LV IVS:        1.00 cm  LV e' lateral:   5.98 cm/s LVOT diam:     1.80 cm  LV E/e' lateral: 22.9 LV SV:         87 LV SV Index:   50 LVOT Area:     2.54 cm  RIGHT VENTRICLE RV S prime:     10.00 cm/s TAPSE  (M-mode): 1.8 cm LEFT ATRIUM             Index       RIGHT ATRIUM           Index LA diam:        4.10 cm 2.37 cm/m  RA Area:     12.80 cm LA Vol (A2C):   49.3 ml 28.51 ml/m RA Volume:   28.50 ml  16.48 ml/m LA Vol (A4C):   68.0 ml 39.32 ml/m LA Biplane Vol: 57.9 ml 33.48 ml/m  AORTIC VALVE AV Area (Vmax):    1.69 cm AV Area (Vmean):   1.77 cm AV Area (VTI):     1.59 cm AV Vmax:           227.00 cm/s AV Vmean:          140.000 cm/s AV VTI:            0.547 m AV Peak Grad:      20.6 mmHg AV Mean Grad:      9.0 mmHg LVOT Vmax:         151.00 cm/s LVOT Vmean:        97.200 cm/s LVOT VTI:          0.341 m LVOT/AV VTI ratio: 0.62  AORTA Ao Root diam: 2.60 cm Ao Asc diam:  3.30 cm MITRAL VALVE MV Area (PHT): 3.14 cm     SHUNTS MV Peak grad:  10.1 mmHg    Systemic VTI:  0.34 m MV Mean grad:  4.0 mmHg     Systemic Diam: 1.80 cm MV Vmax:       1.59 m/s MV Vmean:      89.1 cm/s MV Decel Time: 242 msec MV E velocity: 137.00 cm/s MV A velocity: 110.00 cm/s MV E/A ratio:  1.25 Jenkins Rouge MD Electronically signed by Jenkins Rouge MD Signature Date/Time: 07/03/2020/9:32:13 AM    Final         The results of significant diagnostics from this hospitalization (including imaging, microbiology, ancillary and laboratory) are listed below for reference.     Microbiology: Recent Results (from the past 240 hour(s))  SARS CORONAVIRUS 2 (TAT 6-24 HRS) Nasopharyngeal Nasopharyngeal Swab     Status: None   Collection Time: 06/27/20 12:06 PM   Specimen: Nasopharyngeal Swab  Result Value Ref Range Status   SARS Coronavirus 2 NEGATIVE NEGATIVE Final    Comment: (NOTE) SARS-CoV-2 target nucleic acids are NOT DETECTED.  The SARS-CoV-2 RNA is generally detectable in upper and lower respiratory specimens during the acute phase of infection. Negative results do not preclude SARS-CoV-2 infection, do not rule out co-infections with other pathogens, and should not be used as the sole basis for treatment or other patient  management decisions. Negative results must be combined with clinical observations, patient history, and epidemiological information. The expected result is Negative.  Fact Sheet for Patients: SugarRoll.be  Fact Sheet for Healthcare Providers: https://www.woods-mathews.com/  This test is not  yet approved or cleared by the [place] and  has been authorized for detection and/or diagnosis of SARS-CoV-2 by FDA under an Emergency Use Authorization (EUA). This EUA will remain  in effect (meaning this test can be used) for the duration of the COVID-19 declaration under Se ction 564(b)(1) of the Act, 21 U.S.C. section 360bbb-3(b)(1), unless the authorization is terminated or revoked sooner.  Performed at [facility], [place] [address] [place], [place] [address]   Resp Panel by RT-PCR (Flu A&B, Covid) Nasopharyngeal Swab     Status: None   Collection Time: 07/02/20 12:22 PM   Specimen: Nasopharyngeal Swab; Nasopharyngeal(NP) swabs in vial transport medium  Result Value Ref Range Status   SARS Coronavirus 2 by RT PCR NEGATIVE NEGATIVE Final    Comment: (NOTE) SARS-CoV-2 target nucleic acids are NOT DETECTED.  The SARS-CoV-2 RNA is generally detectable in upper respiratory specimens during the acute phase of infection. The lowest concentration of SARS-CoV-2 viral copies this assay can detect is 138 copies/mL. A negative result does not preclude SARS-Cov-2 infection and should not be used as the sole basis for treatment or other patient management decisions. A negative result may occur with  improper specimen collection/handling, submission of specimen other than nasopharyngeal swab, presence of viral mutation(s) within the areas targeted by this assay, and inadequate number of viral copies(<138 copies/mL). A negative result must be combined with clinical observations, patient history, and epidemiological information. The expected  result is Negative.  Fact Sheet for Patients:  EntrepreneurPulse.com.au  Fact Sheet for Healthcare Providers:  IncredibleEmployment.be  This test is no t yet approved or cleared by the [place] FDA and  has been authorized for detection and/or diagnosis of SARS-CoV-2 by FDA under an Emergency Use Authorization (EUA). This EUA will remain  in effect (meaning this test can be used) for the duration of the COVID-19 declaration under Section 564(b)(1) of the Act, 21 U.S.C.section 360bbb-3(b)(1), unless the authorization is terminated  or revoked sooner.       Influenza A by PCR NEGATIVE NEGATIVE Final   Influenza B by PCR NEGATIVE NEGATIVE Final    Comment: (NOTE) The Xpert Xpress SARS-CoV-2/FLU/RSV plus assay is intended as an aid in the diagnosis of influenza from Nasopharyngeal swab specimens and should not be used as a sole basis for treatment. Nasal washings and aspirates are unacceptable for Xpert Xpress SARS-CoV-2/FLU/RSV testing.  Fact Sheet for Patients: EntrepreneurPulse.com.au  Fact Sheet for Healthcare Providers: IncredibleEmployment.be  This test is not yet approved or cleared by the [place] FDA and has been authorized for detection and/or diagnosis of SARS-CoV-2 by FDA under an Emergency Use Authorization (EUA). This EUA will remain in effect (meaning this test can be used) for the duration of the COVID-19 declaration under Section 564(b)(1) of the Act, 21 U.S.C. section 360bbb-3(b)(1), unless the authorization is terminated or revoked.  Performed at [facility], [place] [address] [place], [place] [address]      Labs: BNP (last 3 results) Recent Labs    07/02/20 0828  BNP 174.0*   Basic Metabolic Panel: Recent Labs  Lab 06/29/20 1047 07/02/20 0828 07/03/20 0032 07/04/20 0521  NA 142 141 138 140  K 4.7 4.0 4.2 3.9  CL 105 108 110 106  CO2  --  23  22 22   GLUCOSE 86 102* 97 88  BUN 20 13 11 13   CREATININE 1.80* 1.51* 1.30* 1.45*  CALCIUM  --  9.0 8.5* 9.2  MG  --   --   --  1.6*  PHOS  --   --   --  3.5   Liver Function Tests: Recent Labs  Lab 07/02/20 0828 07/03/20 0032 07/04/20 0521  AST 15 13*  --   ALT 8 7  --   ALKPHOS 61 53  --   BILITOT 0.7 0.8  --   PROT 6.9 6.1*  --   ALBUMIN 3.9 3.2* 3.7   No results for input(s): LIPASE, AMYLASE in the last 168 hours. No results for input(s): AMMONIA in the last 168 hours. CBC: Recent Labs  Lab 07/02/20 0828 07/02/20 0828 07/02/20 1600 07/03/20 0032 07/03/20 0734 07/03/20 1050 07/04/20 0521  WBC 8.2  --   --   --   --   --  6.1  HGB 7.4*   < > 7.7* 7.4* 8.6* 8.3* 8.8*  HCT 24.0*   < > 24.3* 24.0* 27.7* 26.4* 27.9*  MCV 88.9  --   --   --   --   --  87.2  PLT 254  --   --   --   --   --  248   < > = values in this interval not displayed.   Cardiac Enzymes: No results for input(s): CKTOTAL, CKMB, CKMBINDEX, TROPONINI in the last 168 hours. BNP: Invalid input(s): POCBNP CBG: No results for input(s): GLUCAP in the last 168 hours. D-Dimer No results for input(s): DDIMER in the last 72 hours. Hgb A1c No results for input(s): HGBA1C in the last 72 hours. Lipid Profile No results for input(s): CHOL, HDL, LDLCALC, TRIG, CHOLHDL, LDLDIRECT in the last 72 hours. Thyroid function studies No results for input(s): TSH, T4TOTAL, T3FREE, THYROIDAB in the last 72 hours.  Invalid input(s): FREET3 Anemia work up Recent Labs    07/03/20 1050  VITAMINB12 239  FOLATE 24.7  FERRITIN 15  TIBC 379  IRON 39  RETICCTPCT 2.2   Urinalysis    Component Value Date/Time   COLORURINE YELLOW 02/24/2016 1905   APPEARANCEUR CLEAR 02/24/2016 1905   LABSPEC 1.019 02/24/2016 1905   PHURINE 5.5 02/24/2016 1905   GLUCOSEU NEGATIVE 02/24/2016 1905   HGBUR NEGATIVE 02/24/2016 1905   BILIRUBINUR NEGATIVE 02/24/2016 1905   KETONESUR NEGATIVE 02/24/2016 1905   PROTEINUR NEGATIVE  02/24/2016 1905   UROBILINOGEN 0.2 04/02/2013 0232   NITRITE NEGATIVE 02/24/2016 1905   LEUKOCYTESUR NEGATIVE 02/24/2016 1905   Sepsis Labs Invalid input(s): PROCALCITONIN,  WBC,  LACTICIDVEN   Time coordinating discharge: 40 minutes  SIGNED:  Mercy Riding, MD  [facility] 07/04/2020, 10:39 AM  If 7PM-7AM, please contact night-coverage [email or web address]

## 2020-07-08 ENCOUNTER — Inpatient Hospital Stay (HOSPITAL_COMMUNITY)
Admit: 2020-07-08 | Discharge: 2020-07-08 | Disposition: A | Discharge status: Home / Self Care | Payer: Medicare Other | Attending: Gastroenterology | Admitting: Gastroenterology

## 2020-07-12 ENCOUNTER — Encounter (HOSPITAL_COMMUNITY): Payer: Medicare Other

## 2020-09-07 ENCOUNTER — Ambulatory Visit (INDEPENDENT_AMBULATORY_CARE_PROVIDER_SITE_OTHER)
Admission: RE | Admit: 2020-09-07 | Discharge: 2020-09-07 | Disposition: A | Discharge status: Home / Self Care | Payer: Medicare Other | Source: Ambulatory Visit | Attending: Vascular Surgery | Admitting: Vascular Surgery

## 2020-09-07 ENCOUNTER — Ambulatory Visit (HOSPITAL_COMMUNITY)
Admission: RE | Admit: 2020-09-07 | Discharge: 2020-09-07 | Disposition: A | Discharge status: Home / Self Care | Payer: Medicare Other | Source: Ambulatory Visit | Attending: Vascular Surgery | Admitting: Vascular Surgery

## 2020-09-07 ENCOUNTER — Other Ambulatory Visit: Payer: Self-pay

## 2020-09-07 ENCOUNTER — Ambulatory Visit (INDEPENDENT_AMBULATORY_CARE_PROVIDER_SITE_OTHER): Payer: Medicare Other | Admitting: Vascular Surgery

## 2020-09-07 ENCOUNTER — Encounter: Payer: Self-pay | Admitting: Vascular Surgery

## 2020-09-07 VITALS — BP 140/51 | HR 50 | Temp 96.9°F | Resp 20 | Ht 62.0 in | Wt 151.9 lb

## 2020-09-07 DIAGNOSIS — I6523 Occlusion and stenosis of bilateral carotid arteries: Secondary | ICD-10-CM | POA: Diagnosis present

## 2020-09-07 DIAGNOSIS — I771 Stricture of artery: Secondary | ICD-10-CM | POA: Insufficient documentation

## 2020-09-07 DIAGNOSIS — Z9889 Other specified postprocedural states: Secondary | ICD-10-CM

## 2020-09-07 NOTE — Progress Notes (Signed)
REASON FOR VISIT:   Routine follow-up visit  MEDICAL ISSUES:   BILATERAL CAROTID DISEASE: The patient is undergone previous left carotid endarterectomy in 2011.  Her left carotid endarterectomy site is widely patent.  She has an asymptomatic stable 40 to 59% right carotid stenosis.  I would a follow-up carotid duplex scan in 1 year and I will see her back at that time.  She is on Plavix and is on a statin.  She cannot take aspirin because of her issues with GI bleeds as described below.  PERIPHERAL VASCULAR DISEASE: The patient has patent iliac stents bilaterally.  She is asymptomatic.  She denies any claudication or rest pain or nonhealing ulcers.  I encouraged her to stay as active as possible.  She is not a smoker.  She is on Plavix and is on a statin.   HPI:   [name] is a pleasant 80 y.o. female who I last saw in January 2021.  She has a history of aortoiliac occlusive disease.  In addition she is had a previous left carotid endarterectomy in 2011.  She has bilateral iliac stents.  At the time of her last visit her left carotid endarterectomy site was widely patent.  There is a 40 to 59% right carotid stenosis.  She comes in for a 1 year follow-up visit.  Since I saw her last from a vascular standpoint she has been doing well.  She denies any history of stroke, TIAs, expressive or receptive aphasia, or amaurosis fugax.  She denies any history of claudication, rest pain, or nonhealing ulcers.  She did have an upper GI bleed reportedly requiring 2 units of blood and then subsequently had a lower GI bleed requiring 1 unit of blood.  For this reason she is no longer on aspirin.  She is on her Plavix and on her statin.  She denies any chest pain or significant shortness of breath she remains fairly active.  Past Medical History:  Diagnosis Date  . 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  Asthma   . 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  . Blood in stool    last week of aug 2018  . Carotid artery occlusion   . Complication of anesthesia   . COPD (chronic obstructive pulmonary disease) ([place])   . Coronary artery disease   . 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 Diverticulitis   . Dyspnea    with exertion  . Gastroesophageal reflux disease    occ  . Glaucoma    right eye  . Headache    tension  . Headache syndrome 11/27/2018  . Hiatal hernia   . Hyperlipidemia   . Hypertension   . Iliac artery aneurysm ([place])   . Lung cancer ([place]) dx 2018   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  . Myocardial infarction [facility]) 01/01/2000   Cardiac catheterization  . Peripheral vascular disease ([place])    stents in legs x 2 or 3  . Pneumonia    last time winter 2017 -2018  . PONV (postoperative nausea and vomiting)    occassionally, last colonscopy did ok with anesthesia  . Reflux   . Wears dentures    Full set  . Wears glasses     Family History  Problem Relation Age of Onset  . Heart disease Mother        Heart Disease before age 15  .  Hypertension Mother   . Hyperlipidemia Mother   . Heart attack Mother   . Clotting disorder Mother   . Heart disease Father        Heart Disease before age 50  . Heart attack Father   . Hyperlipidemia Father   . Hypertension Father   . Heart disease Brother        Heart Disease before age 99  . Hyperlipidemia Brother   . Hypertension Brother   . Clotting disorder Brother   . AAA (abdominal aortic aneurysm) Brother   . Cerebral aneurysm Sister   . Hypertension Sister   . AAA (abdominal aortic aneurysm) Sister   . Asthma Sister   . Cerebral aneurysm Brother   . Cancer Brother        Lung  . Hypertension Brother   . Heart attack Brother   . Heart disease Brother        Aneurysm of Brain  . Hypertension Brother   . Heart disease Brother   . Heart disease Brother   . Stroke Son        Aneurysm of Stomach  . AAA  (abdominal aortic aneurysm) Son   . Cancer Maternal Uncle        great uncle/cancer/type unknown    SOCIAL HISTORY: Social History   Tobacco Use  . Smoking status: Former Smoker    Packs/day: 1.50    Years: 30.00    Pack years: 45.00    Types: Cigarettes    Quit date: 08/13/2000    Years since quitting: 20.0  . Smokeless tobacco: Never Used  Substance Use Topics  . Alcohol use: No    Alcohol/week: 0.0 standard drinks    Allergies  Allergen Reactions  . Azithromycin Swelling    Patient reported past history of lip swelling  . Codeine Other (See Comments)    Dr. Terrence Dupont advised patient not to take this medication  . Doxycycline Swelling    Mouth, lips, feet swelling  . Hydromorphone Palpitations and Other (See Comments)    DILAUDID  -  Pt had a Heart Attack after taking Dilaudid.  . Levaquin [Levofloxacin] Other (See Comments)    Chest pressure, SOB, "pain in between shoulder blades", sweaty -as reported by patient per experience in ED this afternoon  . Vitamin D Analogs Swelling  . Avelox [Moxifloxacin Hcl In Nacl] Palpitations    Caused Heart Attack   . Oxycodone-Acetaminophen Other (See Comments)    Says it makes her feel weird  . Risedronate Other (See Comments)    Chest pain    Current Outpatient Medications  Medication Sig Dispense Refill  . acetaminophen (TYLENOL) 325 MG tablet Take 650 mg by mouth every 6 (six) hours as needed for moderate pain or headache.     . albuterol (PROVENTIL) (2.5 MG/3ML) 0.083% nebulizer solution Take 3 mLs (2.5 mg total) by nebulization every 6 (six) hours as needed for wheezing or shortness of breath. 75 mL 12  . albuterol (VENTOLIN HFA) 108 (90 Base) MCG/ACT inhaler Inhale 2 puffs into the lungs every 6 (six) hours as needed for wheezing or shortness of breath. 8 g 6  . amiodarone (PACERONE) 200 MG tablet Take 100 mg by mouth daily.    Marland Kitchen amLODipine-olmesartan (AZOR) 10-40 MG tablet Take 1 tablet by mouth daily.    . cetirizine  (ZYRTEC) 10 MG tablet Take 10 mg by mouth daily as needed for allergies.     Marland Kitchen clopidogrel (PLAVIX) 75 MG tablet Take 1 tablet (  75 mg total) by mouth daily. 90 tablet 3  . dexlansoprazole (DEXILANT) 60 MG capsule Take 1 capsule (60 mg total) by mouth daily before breakfast. 30 capsule 11  . docusate sodium (COLACE) 100 MG capsule Take 1 capsule (100 mg total) by mouth 2 (two) times daily as needed for mild constipation. 60 capsule 2  . ferrous sulfate 325 (65 FE) MG EC tablet Take 1 tablet (325 mg total) by mouth 2 (two) times daily. 180 tablet 3  . Fluticasone-Umeclidin-Vilant (TRELEGY ELLIPTA) 200-62.5-25 MCG/INH AEPB Inhale 1 puff into the lungs daily. 60 each 6  . furosemide (LASIX) 20 MG tablet Take 1 tablet (20 mg total) by mouth daily. 90 tablet 1  . nitroGLYCERIN (NITROSTAT) 0.4 MG SL tablet Place 0.4 mg under the tongue every 5 (five) minutes as needed for chest pain.    Marland Kitchen olmesartan (BENICAR) 20 MG tablet Take 1 tablet (20 mg total) by mouth daily. 90 tablet 1  . ondansetron (ZOFRAN-ODT) 4 MG disintegrating tablet Take 4 mg by mouth every 8 (eight) hours as needed for nausea/vomiting.    . polyethylene glycol powder (MIRALAX) 17 GM/SCOOP powder Take 17 g by mouth 2 (two) times daily as needed for moderate constipation. 255 g 1  . rosuvastatin (CRESTOR) 10 MG tablet Take 10 mg by mouth at bedtime.     Marland Kitchen spironolactone (ALDACTONE) 25 MG tablet Take 0.5 tablets (12.5 mg total) by mouth daily. 30 tablet 1   No current facility-administered medications for this visit.   Facility-Administered Medications Ordered in Other Visits  Medication Dose Route Frequency Provider Last Rate Last Admin  . promethazine (PHENERGAN) injection 6.25-12.5 mg  6.25-12.5 mg Intravenous Q15 min PRN Brennan Bailey, MD        REVIEW OF SYSTEMS:  [X]  denotes positive finding, [ ]  denotes negative finding Cardiac  Comments:  Chest pain or chest pressure:    Shortness of breath upon exertion:    Short of breath  when lying flat:    Irregular heart rhythm:        Vascular    Pain in calf, thigh, or hip brought on by ambulation:    Pain in feet at night that wakes you up from your sleep:     Blood clot in your veins:    Leg swelling:         Pulmonary    Oxygen at home:    Productive cough:     Wheezing:         Neurologic    Sudden weakness in arms or legs:     Sudden numbness in arms or legs:     Sudden onset of difficulty speaking or slurred speech:    Temporary loss of vision in one eye:     Problems with dizziness:         Gastrointestinal    Blood in stool:     Vomited blood:         Genitourinary    Burning when urinating:     Blood in urine:        Psychiatric    Major depression:         Hematologic    Bleeding problems:    Problems with blood clotting too easily:        Skin    Rashes or ulcers:        Constitutional    Fever or chills:     PHYSICAL EXAM:   Vitals:   09/07/20 1030 09/07/20  1034  BP: (!) 154/68 (!) 140/51  Pulse: (!) 50   Resp: 20   Temp: (!) 96.9 F (36.1 C)   TempSrc: Temporal   SpO2: 97%   Weight: 151 lb 14.4 oz (68.9 kg)   Height: 5\' 2"  (1.575 m)     GENERAL: The patient is a well-nourished female, in no acute distress. The vital signs are documented above. CARDIAC: There is a regular rate and rhythm.  VASCULAR: She has a right carotid bruit. She has palpable femoral pulses and palpable dorsalis pedis pulses bilaterally. PULMONARY: There is good air exchange bilaterally without wheezing or rales. ABDOMEN: Soft and non-tender with normal pitched bowel sounds.  MUSCULOSKELETAL: There are no major deformities or cyanosis. NEUROLOGIC: No focal weakness or paresthesias are detected. SKIN: There are no ulcers or rashes noted. PSYCHIATRIC: The patient has a normal affect.  DATA:    CAROTID DUPLEX: I have independently interpreted her carotid duplex scan today.  On the right side is a 40 to 59% carotid stenosis.  This is unchanged  over the last year.  The right vertebral artery is patent with antegrade flow.  On the left side there is no evidence of recurrent stenosis where she is had previous left carotid endarterectomy.  The left vertebral artery is patent with antegrade flow.  ARTERIAL DOPPLER STUDY: I have independently interpreted her arterial Doppler study.  On the right side there is a biphasic dorsalis pedis and posterior tibial signal.  ABI is 88%.  Toe pressure is 96 mmHg.  On the left side, there is a biphasic dorsalis pedis and posterior tibial signal.  ABIs 84%.  Toe pressure 76 mmHg.  AORTOILIAC DUPLEX: I independently interpreted her aortoiliac duplex.  There was somewhat limited visualization because of overlying bowel gas but it looks like her iliac arteries are patent bilaterally with no areas of significantly elevated velocities noted.  Deitra Mayo [facility] [phone]

## 2020-10-17 ENCOUNTER — Other Ambulatory Visit (HOSPITAL_COMMUNITY)
Admission: AD | Admit: 2020-10-17 | Discharge: 2020-10-17 | Disposition: A | Discharge status: Home / Self Care | Payer: Medicare Other | Source: Ambulatory Visit | Attending: Cardiology | Admitting: Cardiology

## 2020-10-17 DIAGNOSIS — I10 Essential (primary) hypertension: Secondary | ICD-10-CM | POA: Insufficient documentation

## 2020-10-17 DIAGNOSIS — E785 Hyperlipidemia, unspecified: Secondary | ICD-10-CM | POA: Insufficient documentation

## 2020-10-17 DIAGNOSIS — I2510 Atherosclerotic heart disease of native coronary artery without angina pectoris: Secondary | ICD-10-CM | POA: Diagnosis not present

## 2020-10-17 LAB — HEPATIC FUNCTION PANEL
ALT: 9 U/L (ref 0–44)
AST: 16 U/L (ref 15–41)
Albumin: 3.6 g/dL (ref 3.5–5.0)
Alkaline Phosphatase: 59 U/L (ref 38–126)
Bilirubin, Direct: <0.1 mg/dL (ref 0.0–0.2)
Total Bilirubin: 0.3 mg/dL (ref 0.3–1.2)
Total Protein: 6.8 g/dL (ref 6.5–8.1)

## 2020-10-17 LAB — TSH: TSH: 2.91 u[IU]/mL (ref 0.350–4.500)

## 2020-10-17 LAB — LIPID PANEL
Cholesterol: 150 mg/dL (ref 0–200)
HDL: 47 mg/dL (ref >40)
LDL Cholesterol: 89 mg/dL (ref 0–99)
Total CHOL/HDL Ratio: 3.2 RATIO
Triglycerides: 68 mg/dL (ref <150)
VLDL: 14 mg/dL (ref 0–40)

## 2020-10-17 LAB — BASIC METABOLIC PANEL
Anion gap: 8 (ref 5–15)
BUN: 26 mg/dL — ABNORMAL HIGH (ref 8–23)
CO2: 20 mmol/L — ABNORMAL LOW (ref 22–32)
Calcium: 9.3 mg/dL (ref 8.9–10.3)
Chloride: 111 mmol/L (ref 98–111)
Creatinine, Ser: 1.91 mg/dL — ABNORMAL HIGH (ref 0.44–1.00)
GFR, Estimated: 27 mL/min — ABNORMAL LOW (ref >60)
Glucose, Bld: 96 mg/dL (ref 70–99)
Potassium: 5.1 mmol/L (ref 3.5–5.1)
Sodium: 139 mmol/L (ref 135–145)

## 2020-11-08 ENCOUNTER — Encounter (HOSPITAL_COMMUNITY): Payer: Self-pay

## 2020-11-08 ENCOUNTER — Emergency Department (HOSPITAL_COMMUNITY)
Admission: EM | Admit: 2020-11-08 | Discharge: 2020-11-08 | Disposition: A | Discharge status: Home / Self Care | Payer: Medicare Other | Attending: Emergency Medicine | Admitting: Emergency Medicine

## 2020-11-08 ENCOUNTER — Other Ambulatory Visit: Payer: Self-pay

## 2020-11-08 ENCOUNTER — Emergency Department (HOSPITAL_COMMUNITY): Payer: Medicare Other

## 2020-11-08 DIAGNOSIS — R079 Chest pain, unspecified: Secondary | ICD-10-CM | POA: Diagnosis present

## 2020-11-08 DIAGNOSIS — Z7902 Long term (current) use of antithrombotics/antiplatelets: Secondary | ICD-10-CM | POA: Insufficient documentation

## 2020-11-08 DIAGNOSIS — I5031 Acute diastolic (congestive) heart failure: Secondary | ICD-10-CM | POA: Diagnosis not present

## 2020-11-08 DIAGNOSIS — Z951 Presence of aortocoronary bypass graft: Secondary | ICD-10-CM | POA: Diagnosis not present

## 2020-11-08 DIAGNOSIS — J441 Chronic obstructive pulmonary disease with (acute) exacerbation: Secondary | ICD-10-CM | POA: Diagnosis not present

## 2020-11-08 DIAGNOSIS — J45909 Unspecified asthma, uncomplicated: Secondary | ICD-10-CM | POA: Insufficient documentation

## 2020-11-08 DIAGNOSIS — Z79899 Other long term (current) drug therapy: Secondary | ICD-10-CM | POA: Diagnosis not present

## 2020-11-08 DIAGNOSIS — Z7951 Long term (current) use of inhaled steroids: Secondary | ICD-10-CM | POA: Diagnosis not present

## 2020-11-08 DIAGNOSIS — Z87891 Personal history of nicotine dependence: Secondary | ICD-10-CM | POA: Diagnosis not present

## 2020-11-08 DIAGNOSIS — I2510 Atherosclerotic heart disease of native coronary artery without angina pectoris: Secondary | ICD-10-CM | POA: Insufficient documentation

## 2020-11-08 DIAGNOSIS — Z85118 Personal history of other malignant neoplasm of bronchus and lung: Secondary | ICD-10-CM | POA: Diagnosis not present

## 2020-11-08 DIAGNOSIS — R0789 Other chest pain: Secondary | ICD-10-CM | POA: Diagnosis not present

## 2020-11-08 LAB — PROTIME-INR
INR: 1.1 (ref 0.8–1.2)
Prothrombin Time: 13.6 seconds (ref 11.4–15.2)

## 2020-11-08 LAB — CBC WITH DIFFERENTIAL/PLATELET
Abs Immature Granulocytes: 0.02 10*3/uL (ref 0.00–0.07)
Basophils Absolute: 0.1 10*3/uL (ref 0.0–0.1)
Basophils Relative: 1 %
Eosinophils Absolute: 0.3 10*3/uL (ref 0.0–0.5)
Eosinophils Relative: 3 %
HCT: 32.9 % — ABNORMAL LOW (ref 36.0–46.0)
Hemoglobin: 11.3 g/dL — ABNORMAL LOW (ref 12.0–15.0)
Immature Granulocytes: 0 %
Lymphocytes Relative: 22 %
Lymphs Abs: 1.9 10*3/uL (ref 0.7–4.0)
MCH: 31.2 pg (ref 26.0–34.0)
MCHC: 34.3 g/dL (ref 30.0–36.0)
MCV: 90.9 fL (ref 80.0–100.0)
Monocytes Absolute: 0.7 10*3/uL (ref 0.1–1.0)
Monocytes Relative: 8 %
Neutro Abs: 5.9 10*3/uL (ref 1.7–7.7)
Neutrophils Relative %: 66 %
Platelets: 274 10*3/uL (ref 150–400)
RBC: 3.62 MIL/uL — ABNORMAL LOW (ref 3.87–5.11)
RDW: 13.8 % (ref 11.5–15.5)
WBC: 8.9 10*3/uL (ref 4.0–10.5)
nRBC: 0 % (ref 0.0–0.2)

## 2020-11-08 LAB — COMPREHENSIVE METABOLIC PANEL
ALT: 10 U/L (ref 0–44)
AST: 18 U/L (ref 15–41)
Albumin: 3.8 g/dL (ref 3.5–5.0)
Alkaline Phosphatase: 67 U/L (ref 38–126)
Anion gap: 10 (ref 5–15)
BUN: 27 mg/dL — ABNORMAL HIGH (ref 8–23)
CO2: 25 mmol/L (ref 22–32)
Calcium: 9.3 mg/dL (ref 8.9–10.3)
Chloride: 102 mmol/L (ref 98–111)
Creatinine, Ser: 1.76 mg/dL — ABNORMAL HIGH (ref 0.44–1.00)
GFR, Estimated: 29 mL/min — ABNORMAL LOW (ref >60)
Glucose, Bld: 114 mg/dL — ABNORMAL HIGH (ref 70–99)
Potassium: 3.9 mmol/L (ref 3.5–5.1)
Sodium: 137 mmol/L (ref 135–145)
Total Bilirubin: 0.4 mg/dL (ref 0.3–1.2)
Total Protein: 7.4 g/dL (ref 6.5–8.1)

## 2020-11-08 LAB — CBG MONITORING, ED: Glucose-Capillary: 113 mg/dL — ABNORMAL HIGH (ref 70–99)

## 2020-11-08 LAB — TROPONIN I (HIGH SENSITIVITY)
Troponin I (High Sensitivity): 11 ng/L (ref <18)
Troponin I (High Sensitivity): 13 ng/L (ref <18)

## 2020-11-08 LAB — D-DIMER, QUANTITATIVE: D-Dimer, Quant: 0.64 ug/mL-FEU — ABNORMAL HIGH (ref 0.00–0.50)

## 2020-11-08 LAB — MAGNESIUM: Magnesium: 1.6 mg/dL — ABNORMAL LOW (ref 1.7–2.4)

## 2020-11-08 LAB — BRAIN NATRIURETIC PEPTIDE: B Natriuretic Peptide: 120.1 pg/mL — ABNORMAL HIGH (ref 0.0–100.0)

## 2020-11-08 MED ORDER — ASPIRIN 81 MG PO CHEW
324.0000 mg | CHEWABLE_TABLET | Freq: Once | ORAL | Status: DC
  Filled 2020-11-08: qty 4

## 2020-11-08 MED ORDER — MAGNESIUM SULFATE 2 GM/50ML IV SOLN
2.0000 g | Freq: Once | INTRAVENOUS | Status: AC
  Administered 2020-11-08: 2 g via INTRAVENOUS
  Filled 2020-11-08: qty 50

## 2020-11-08 NOTE — Discharge Instructions (Addendum)
As discussed, today's evaluation has been generally reassuring.  However, with your mild electrolyte abnormalities and today's episode of chest pain that has been important you follow-up with your cardiologist tomorrow.  They will call you for follow-up, but if they do not, please be sure to call to be seen tomorrow.  Return here for concerning changes in your condition.

## 2020-11-08 NOTE — ED Provider Notes (Signed)
[name]   CSN: [id number] Arrival date & time: 11/08/20  1857     History Chief Complaint  Patient presents with  . Chest Pain    [name] is a 80 y.o. female.  HPI Is on multiple medical issues including prior MI, now presents with chest pain.  This episode began earlier in the day, at least 3 hours ago.  Since that time it is improved after receiving nitroglycerin from EMS providers. History is obtained by the patient, and EMS staff. Patient was generally well prior to this, though she does Mccullough recent change in her blood pressure medication regimen. She notes that the onset was a few hours ago, and pain was initially severe, sustained, superior, tight.  Since that time the pain is in the same location that was improved in characteristics.  Some relief with nitro as above, and belching. Patient received fluids in route, with noted to have bigeminy, and with perfusing rhythm of 30s for monitoring transport.     Past Medical History:  Diagnosis Date  . 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 Asthma   . 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  . Blood in stool    last week of aug 2018  . Carotid artery occlusion   . Complication of anesthesia   . COPD (chronic obstructive pulmonary disease) ([place])   . Coronary artery disease   . 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 Diverticulitis   . Dyspnea    with exertion  . Gastroesophageal reflux disease    occ  . Glaucoma    right eye  . Headache    tension  . Headache syndrome 11/27/2018  . Hiatal hernia   . Hyperlipidemia   . Hypertension   . Iliac artery aneurysm ([place])   . 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 Myocardial infarction [facility]) 01/01/2000   Cardiac catheterization  . Peripheral vascular disease ([place])    stents in legs x 2  or 3  . Pneumonia    last time winter 2017 -2018  . PONV (postoperative nausea and vomiting)    occassionally, last colonscopy did ok with anesthesia  . Reflux   . Wears dentures    Full set  . Wears glasses     Patient Active Problem List   Diagnosis Date Noted  . Acute diastolic CHF (congestive heart failure) ([place]) 07/03/2020  . Symptomatic anemia 07/02/2020  . TIA (transient ischemic attack) 02/11/2019  . Headache syndrome 11/27/2018  . GI bleed 07/15/2018  . Occult blood positive stool 07/14/2018  . History of shingles 06/23/2018  . Neuralgia and neuritis 06/23/2018  . Costochondritis, acute 04/18/2018  . Hyperkalemia 02/06/2017  . Physical deconditioning 11/08/2016  . Acute lower GI bleeding   . BRBPR (bright red blood per rectum) 09/15/2016  . Iron deficiency anemia 06/07/2016  . Bronchitis, acute 06/07/2016  . 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 Primary cancer of right lower lobe of lung ([place]) 04/25/2016  . 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 Foot pain, right 10/24/2015  . CAP (community acquired pneumonia) 10/22/2015  . UTI (urinary tract infection) 10/22/2015  . COPD exacerbation ([place]) 10/22/2015  . Neck pain on right side 01/05/2015  . Chest pain 08/02/2014  . Intractable headache 08/02/2014  . HLD (hyperlipidemia) 08/02/2014  . Essential hypertension 08/02/2014  .  GERD (gastroesophageal reflux disease) 08/02/2014  . Leg pain, right 08/02/2014  . HA (headache)   . Carotid artery stenosis 12/09/2013  . 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  . Dehydration 04/02/2013  . CAD (coronary artery disease) 04/02/2013  . AKI (acute kidney injury) ([place]) 04/01/2013  . Nausea & vomiting 04/01/2013  . Diarrhea 04/01/2013  . Hypokalemia 04/01/2013  . Hyponatremia 04/01/2013  . Peripheral vascular disease ([place]) 01/07/2013  . Occlusion and stenosis of carotid artery without mention of cerebral infarction 01/07/2013  . Dizziness and giddiness 01/07/2013  . Shortness of breath  01/07/2013  . Cough 11/17/2012  . COPD GOLD II 11/17/2012    Past Surgical History:  Procedure Laterality Date  . ABDOMINAL HYSTERECTOMY    . APPENDECTOMY    . Arnold-chiari malformation repair  1998   Suboccipital craniectomy  . CAROTID ENDARTERECTOMY  03/29/2010   Left  CEA  . CHOLECYSTECTOMY     Gall Bladder  . COLONOSCOPY WITH PROPOFOL N/A 04/22/2015   Procedure: COLONOSCOPY WITH PROPOFOL;  Surgeon: Carol Ada, MD;  Location: [facility];  Service: Endoscopy;  Laterality: N/A;  . COLONOSCOPY WITH PROPOFOL N/A 05/25/2016   Procedure: COLONOSCOPY WITH PROPOFOL;  Surgeon: Carol Ada, MD;  Location: [facility];  Service: Endoscopy;  Laterality: N/A;  . COLONOSCOPY WITH PROPOFOL N/A 05/03/2017   Procedure: COLONOSCOPY WITH PROPOFOL;  Surgeon: Carol Ada, MD;  Location: [facility];  Service: Endoscopy;  Laterality: N/A;  . COLONOSCOPY WITH PROPOFOL N/A 06/29/2020   Procedure: COLONOSCOPY WITH PROPOFOL;  Surgeon: Carol Ada, MD;  Location: [facility];  Service: Endoscopy;  Laterality: N/A;  . CORNEAL TRANSPLANT     Right  . CORONARY ARTERY BYPASS GRAFT  01/01/2000   x 3  . ENTEROSCOPY N/A 02/27/2018   Procedure: ENTEROSCOPY;  Surgeon: Carol Ada, MD;  [facility]: [facility];  Service: Endoscopy;  Laterality: N/A;  . ENTEROSCOPY N/A 07/18/2018   Procedure: ENTEROSCOPY;  Surgeon: Carol Ada, MD;  [facility]: [facility];  Service: Endoscopy;  Laterality: N/A;  . ENTEROSCOPY N/A 06/29/2020   Procedure: ENTEROSCOPY;  Surgeon: Carol Ada, MD;  [facility]: [facility];  Service: Endoscopy;  Laterality: N/A;  . ESOPHAGOGASTRODUODENOSCOPY N/A 05/25/2016   Procedure: ESOPHAGOGASTRODUODENOSCOPY (EGD);  Surgeon: Carol Ada, MD;  Location: [facility];  Service: Endoscopy;  Laterality: N/A;  . ESOPHAGOGASTRODUODENOSCOPY N/A 08/01/2018   Procedure: ESOPHAGOGASTRODUODENOSCOPY (EGD);  Surgeon: Carol Ada, MD;  Location: [facility];  Service: Endoscopy;  Laterality: N/A;  .  EYE SURGERY Right 1995 or 1996   Laser surgery for retinal hemorrhage  . GIVENS CAPSULE STUDY N/A 07/16/2018   Procedure: GIVENS CAPSULE STUDY;  Surgeon: Carol Ada, MD;  [facility]: [facility];  Service: Endoscopy;  Laterality: N/A;  . HEMOSTASIS CLIP PLACEMENT  06/29/2020   Procedure: HEMOSTASIS CLIP PLACEMENT;  Surgeon: Carol Ada, MD;  [facility]: [facility];  Service: Endoscopy;;  . HOT HEMOSTASIS N/A 02/27/2018   Procedure: HOT HEMOSTASIS (ARGON PLASMA COAGULATION/BICAP);  Surgeon: Carol Ada, MD;  Location: [facility];  Service: Endoscopy;  Laterality: N/A;  . HOT HEMOSTASIS N/A 08/01/2018   Procedure: HOT HEMOSTASIS (ARGON PLASMA COAGULATION/BICAP);  Surgeon: Carol Ada, MD;  Location: [facility];  Service: Endoscopy;  Laterality: N/A;  . HOT HEMOSTASIS N/A 06/29/2020   Procedure: HOT HEMOSTASIS (ARGON PLASMA COAGULATION/BICAP);  Surgeon: Carol Ada, MD;  Location: [facility];  Service: Endoscopy;  Laterality: N/A;  . IR RADIOLOGIST EVAL & MGMT  12/14/2016  . LEFT HEART CATH AND CORS/GRAFTS ANGIOGRAPHY N/A 01/09/2018   Procedure: LEFT HEART CATH AND CORS/GRAFTS ANGIOGRAPHY;  Surgeon: Charolette Forward, MD;  Location: [facility];  Service: Cardiovascular;  Laterality: N/A;  . LEFT HEART CATHETERIZATION WITH CORONARY ANGIOGRAM N/A 08/03/2014   Procedure: LEFT HEART CATHETERIZATION WITH CORONARY ANGIOGRAM;  Surgeon: Birdie Riddle, MD;  Location: [facility];  Service: Cardiovascular;  Laterality: N/A;  . Post Coronary Artery  BPG  01/05/2000   Right jugular sheath removed  . PR VEIN BYPASS GRAFT,AORTO-FEM-POP    . ROTATOR CUFF REPAIR     Right     OB History   No obstetric history on file.     Family History  Problem Relation Age of Onset  . Heart disease Mother        Heart Disease before age 36  . Hypertension Mother   . Hyperlipidemia Mother   . Heart attack Mother   . Clotting disorder Mother   . Heart disease Father        Heart Disease before age  53  . Heart attack Father   . Hyperlipidemia Father   . Hypertension Father   . Heart disease Brother        Heart Disease before age 74  . Hyperlipidemia Brother   . Hypertension Brother   . Clotting disorder Brother   . AAA (abdominal aortic aneurysm) Brother   . Cerebral aneurysm Sister   . Hypertension Sister   . AAA (abdominal aortic aneurysm) Sister   . Asthma Sister   . Cerebral aneurysm Brother   . Cancer Brother        Lung  . Hypertension Brother   . Heart attack Brother   . Heart disease Brother        Aneurysm of Brain  . Hypertension Brother   . Heart disease Brother   . Heart disease Brother   . Stroke Son        Aneurysm of Stomach  . AAA (abdominal aortic aneurysm) Son   . Cancer Maternal Uncle        great uncle/cancer/type unknown    Social History   Tobacco Use  . Smoking status: Former Smoker    Packs/day: 1.50    Years: 30.00    Pack years: 45.00    Types: Cigarettes    Quit date: 08/13/2000    Years since quitting: 20.2  . Smokeless tobacco: Never Used  Vaping Use  . Vaping Use: Never used  Substance Use Topics  . Alcohol use: No    Alcohol/week: 0.0 standard drinks  . Drug use: No    Home Medications Prior to Admission medications   Medication Sig Start Date End Date Taking? Authorizing Provider  acetaminophen (TYLENOL) 325 MG tablet Take 650 mg by mouth every 6 (six) hours as needed for moderate pain or headache.     [provider]  albuterol (PROVENTIL) (2.5 MG/3ML) 0.083% nebulizer solution Take 3 mLs (2.5 mg total) by nebulization every 6 (six) hours as needed for wheezing or shortness of breath. 05/25/20   Icard, Octavio Graves, DO  albuterol (VENTOLIN HFA) 108 (90 Base) MCG/ACT inhaler Inhale 2 puffs into the lungs every 6 (six) hours as needed for wheezing or shortness of breath. 05/25/20   Garner Nash, DO  amiodarone (PACERONE) 200 MG tablet Take 100 mg by mouth daily. 05/25/20   [provider]   amLODipine-olmesartan (AZOR) 10-40 MG tablet Take 1 tablet by mouth daily. 08/18/20   [provider]  cetirizine (ZYRTEC) 10 MG tablet Take 10 mg by mouth daily as needed  for allergies.     [provider]  clopidogrel (PLAVIX) 75 MG tablet Take 1 tablet (75 mg total) by mouth daily. 07/08/20   Mercy Riding, MD  dexlansoprazole (DEXILANT) 60 MG capsule Take 1 capsule (60 mg total) by mouth daily before breakfast. 11/17/12   Tanda Rockers, MD  docusate sodium (COLACE) 100 MG capsule Take 1 capsule (100 mg total) by mouth 2 (two) times daily as needed for mild constipation. 07/04/20 07/04/21  Mercy Riding, MD  ferrous sulfate 325 (65 FE) MG EC tablet Take 1 tablet (325 mg total) by mouth 2 (two) times daily. 07/08/20 07/08/21  Mercy Riding, MD  Fluticasone-Umeclidin-Vilant (TRELEGY ELLIPTA) 200-62.5-25 MCG/INH AEPB Inhale 1 puff into the lungs daily. 05/25/20   Icard, Octavio Graves, DO  furosemide (LASIX) 20 MG tablet Take 1 tablet (20 mg total) by mouth daily. 07/04/20 12/31/20  Mercy Riding, MD  nitroGLYCERIN (NITROSTAT) 0.4 MG SL tablet Place 0.4 mg under the tongue every 5 (five) minutes as needed for chest pain.    [provider]  olmesartan (BENICAR) 20 MG tablet Take 1 tablet (20 mg total) by mouth daily. 07/04/20   Mercy Riding, MD  ondansetron (ZOFRAN-ODT) 4 MG disintegrating tablet Take 4 mg by mouth every 8 (eight) hours as needed for nausea/vomiting. 02/17/20   [provider]  polyethylene glycol powder (MIRALAX) 17 GM/SCOOP powder Take 17 g by mouth 2 (two) times daily as needed for moderate constipation. 07/04/20   Mercy Riding, MD  rosuvastatin (CRESTOR) 10 MG tablet Take 10 mg by mouth at bedtime.     [provider]  spironolactone (ALDACTONE) 25 MG tablet Take 0.5 tablets (12.5 mg total) by mouth daily. 07/04/20   Mercy Riding, MD    Allergies    Azithromycin, Codeine, Doxycycline, Hydromorphone, Levaquin [levofloxacin], Vitamin d  analogs, Avelox [moxifloxacin hcl in nacl], Oxycodone-acetaminophen, and Risedronate  Review of Systems   Review of Systems  Constitutional:       Per HPI, otherwise negative  HENT:       Per HPI, otherwise negative  Respiratory:       Per HPI, otherwise negative  Cardiovascular:       Per HPI, otherwise negative  Gastrointestinal: Negative for vomiting.  Endocrine:       Negative aside from HPI  Genitourinary:       Neg aside from HPI   Musculoskeletal:       Per HPI, otherwise negative  Skin: Negative.   Neurological: Negative for syncope.    Physical Exam Updated Vital Signs BP (!) 149/59   Pulse (!) 53   Temp 97.7 F (36.5 C) (Oral)   Resp 16   Ht 5\' 2"  (1.575 m)   Wt 67.1 kg   SpO2 97%   BMI 27.07 kg/m   Physical Exam Vitals and nursing Mccullough reviewed.  Constitutional:      General: She is not in acute distress.    Appearance: She is well-developed.  HENT:     Head: Normocephalic and atraumatic.  Eyes:     Conjunctiva/sclera: Conjunctivae normal.  Cardiovascular:     Rate and Rhythm: Normal rate and regular rhythm.  Pulmonary:     Effort: Pulmonary effort is normal. No respiratory distress.     Breath sounds: Normal breath sounds. No stridor.  Abdominal:     General: There is no distension.  Skin:    General: Skin is warm and dry.  Neurological:  Mental Status: She is alert and oriented to person, place, and time.     Cranial Nerves: No cranial nerve deficit.      ED Results / Procedures / Treatments   Labs (all labs ordered are listed, but only abnormal results are displayed) Labs Reviewed  COMPREHENSIVE METABOLIC PANEL - Abnormal; Notable for the following components:      Result Value   Glucose, Bld 114 (*)    BUN 27 (*)    Creatinine, Ser 1.76 (*)    GFR, Estimated 29 (*)    All other components within normal limits  MAGNESIUM - Abnormal; Notable for the following components:   Magnesium 1.6 (*)    All other components within normal  limits  BRAIN NATRIURETIC PEPTIDE - Abnormal; Notable for the following components:   B Natriuretic Peptide 120.1 (*)    All other components within normal limits  CBC WITH DIFFERENTIAL/PLATELET - Abnormal; Notable for the following components:   RBC 3.62 (*)    Hemoglobin 11.3 (*)    HCT 32.9 (*)    All other components within normal limits  D-DIMER, QUANTITATIVE - Abnormal; Notable for the following components:   D-Dimer, Quant 0.64 (*)    All other components within normal limits  CBG MONITORING, ED - Abnormal; Notable for the following components:   Glucose-Capillary 113 (*)    All other components within normal limits  PROTIME-INR  TROPONIN I (HIGH SENSITIVITY)  TROPONIN I (HIGH SENSITIVITY)    EMS rhythm strip #1 sinus rhythm, rate 72, artifact, otherwise unremarkable. EMS rhythm strip #2, sinus rhythm, rate 85, frequent PVC, wander, artifact, abnormal  EKG EKG Interpretation  Date/Time:  Tuesday November 08 2020 19:03:28 EDT Ventricular Rate:  65 PR Interval:  175 QRS Duration: 90 QT Interval:  408 QTC Calculation: 425 R Axis:   92 Text Interpretation: Right and left arm electrode reversal, interpretation assumes no reversal Sinus rhythm Right axis deviation Unremarkable EKG Confirmed by Carmin Muskrat [phone]) on 11/08/2020 7:21:40 PM   Radiology DG Chest Portable 1 View  Result Date: 11/08/2020 CLINICAL DATA:  Chest pain EXAM: PORTABLE CHEST 1 VIEW COMPARISON:  07/02/2020 FINDINGS: Post sternotomy changes. No focal opacity or pleural effusion. Stable cardiomediastinal silhouette with aortic atherosclerosis. Right infrahilar opacity presumably corresponds to CT demonstrated lung mass. No pneumothorax IMPRESSION: No active disease. Right infrahilar opacity presumably corresponds to CT demonstrated lung mass. Electronically Signed   By: Donavan Foil M.D.   On: 11/08/2020 19:19    Procedures Procedures   Medications Ordered in ED Medications  aspirin chewable tablet  324 mg (0 mg Oral Hold 11/08/20 1920)  magnesium sulfate IVPB 2 g 50 mL (2 g Intravenous New Bag/Given 11/08/20 2218)    ED Course  I have reviewed the triage vital signs and the nursing notes.  Pertinent labs & imaging results that were available during my care of the patient were reviewed by me and considered in my medical decision making (see chart for details).  My exam patient is now accompanied by her daughter. We discussed today's presentation. Daughter provides some details about the patient's recent change in medication due to possible bradycardia versus bigeminy.  Update: I discussed the patient's case with her cardiologist. Mccullough patient recently stopped amiodarone, had a switch in blood pressure medication due to arrhythmia, as above. On repeat evaluation patient continues to deny any ongoing complaints, has sinus bradycardia, rate 56, blood pressure unremarkable, pulse oximetry 100% room air normal I discussed vitals, labs thus far including  hypomagnesemia but normal troponin with patient's cardiologist. Plan is for repletion of repletion of magnesium and if the second 1 is normal she can follow-up with him in the office tomorrow.  11:27 PM Second troponin XIII, without ongoing pain, with repletion of patient's magnesium, low suspicion for ongoing ischemia, no evidence for acute new pathology such as pneumonia, pneumothorax, no evidence for other infection, patient appropriate for following up tomorrow with her cardiologist.    MDM Rules/Calculators/A&P MDM Number of Diagnoses or Management Options Atypical chest pain: new, needed workup Hypomagnesemia: new, needed workup   Amount and/or Complexity of Data Reviewed Clinical lab tests: reviewed and ordered Tests in the radiology section of CPT: ordered and reviewed Tests in the medicine section of CPT: ordered and reviewed Decide to obtain previous medical records or to obtain history from someone other than the patient:  yes Obtain history from someone other than the patient: yes Review and summarize past medical records: yes Discuss the patient with other providers: yes Independent visualization of images, tracings, or specimens: yes  Risk of Complications, Morbidity, and/or Mortality Presenting problems: high Diagnostic procedures: high Management options: high  Critical Care Total time providing critical care: < 30 minutes  Patient Progress Patient progress: stable  Final Clinical Impression(s) / ED Diagnoses Final diagnoses:  Atypical chest pain  Hypomagnesemia     Carmin Muskrat, MD 11/08/20 2328

## 2020-11-08 NOTE — ED Notes (Signed)
E-signature pad unavailable at time of pt discharge. This RN discussed discharge materials with pt and answered all pt questions. Pt stated understanding of discharge material. ? ?

## 2020-11-08 NOTE — ED Triage Notes (Signed)
Pt bib [facility]. Pt has been having low heart rates for past month, seen by MD, amlodipine d/c'd. Today at 1700, pt was fixing dinner and started having chest pressure, nausea, sweating, and SOB. EMS noted that pt was in bigeminy with underlying rate in 30s on their arrival. Pt received NTGx1, zofran and ASA 324mg  en route.   EMS VS:  216/60 L arm 140/palp R arm

## 2020-11-09 ENCOUNTER — Telehealth: Payer: Self-pay | Admitting: Pulmonary Disease

## 2020-11-09 NOTE — Telephone Encounter (Signed)
PCCM:  CXR looks ok to me. Looks similar to her previous area that she received radiation for her lung cancer.   I have CC'd Dr. Thad Ranger. She should be getting her yearly CT Chest in July of this year. She had one in November.   I will let him look at the images as well. If he thinks we need to scan you early we can.   Thanks  Garner Nash, DO [facility] 11/09/2020 5:17 PM

## 2020-11-09 NOTE — Telephone Encounter (Signed)
Called and spoke with pt letting her know the info stated by Dr. Valeta Harms and she verbalized understanding.nothing further needed.

## 2020-11-09 NOTE — Telephone Encounter (Signed)
Called and spoke with pt who stated that she went to ED yesterday 3/29 and they did a cxr while she was there. Pt went to see her cardiologist today 3/30 and pt said that cards told her to call our office to make sure Dr. Valeta Harms reviewed her cxr that was done.  Pt said that she does have an upcoming appt with BI 4/4 and wanted to call about this in advance. Routing to Dr. Valeta Harms.

## 2020-11-14 ENCOUNTER — Other Ambulatory Visit: Payer: Self-pay

## 2020-11-14 ENCOUNTER — Encounter: Payer: Self-pay | Admitting: Pulmonary Disease

## 2020-11-14 ENCOUNTER — Ambulatory Visit (INDEPENDENT_AMBULATORY_CARE_PROVIDER_SITE_OTHER): Payer: Medicare Other | Admitting: Pulmonary Disease

## 2020-11-14 VITALS — BP 118/62 | HR 56 | Temp 97.6°F | Ht 62.0 in | Wt 150.0 lb

## 2020-11-14 DIAGNOSIS — C3431 Malignant neoplasm of lower lobe, right bronchus or lung: Secondary | ICD-10-CM | POA: Diagnosis not present

## 2020-11-14 DIAGNOSIS — J449 Chronic obstructive pulmonary disease, unspecified: Secondary | ICD-10-CM | POA: Diagnosis not present

## 2020-11-14 DIAGNOSIS — Z85118 Personal history of other malignant neoplasm of bronchus and lung: Secondary | ICD-10-CM

## 2020-11-14 DIAGNOSIS — Z923 Personal history of irradiation: Secondary | ICD-10-CM | POA: Diagnosis not present

## 2020-11-14 DIAGNOSIS — Z951 Presence of aortocoronary bypass graft: Secondary | ICD-10-CM

## 2020-11-14 NOTE — Progress Notes (Signed)
Synopsis: Referred in January 2020 to establish care, former patient of Dr. Lenna Gilford   Subjective:   PATIENT ID: [name] GENDER: female DOB: 02-Oct-1940, MRN: [id number]  Chief Complaint  Patient presents with  . Follow-up    Allergies. Sob same as last visit. Productive cough with white mucus started last week. No wheezing.     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  She is followed with Dr. Earlie Server and Dr. Tammi Klippel.  In the oncology services.  She has an appointment with him tomorrow.  She had a recent CT scan completed yesterday which reveals right lower lobe groundglass opacities and scarring after stereotactic radiation to the right lower lobe.  She also has a small 7 mm central nodular location that will need to be followed.  She had PFTs completed today prior to her office visit.  The pulmonary function test revealed moderate COPD with an FEV1 65% postbronchodilator reduced ratio of 66, significant bronchodilator response with greater than 12% improvement, as well as a reduced DLCO 55% predicted.  Patient states today that she quit using her Anora inhaler several months ago because she did not know if it was making much difference in her breathing symptoms.  Before that she liked being on it.  There was some confusion regarding stopping blood pressure medication Azor versus stopping her Anoro.  She may have misunderstood that that information regarding changing of medications.  However she does have a stock supply of an oral at home that she could restart.  She did think it made a little bit of difference but was not sure.  At baseline she is dyspneic on exertion but she does not have any significant cough or daily sputum production.  OV 02/19/2019: recently in the hospital for TIA symptoms. No stroke. Now on plavix, taken off ASA. Followed planned with Dr. Julien Nordmann from  oncology on 15th. Doing well since hospitalization. Concerned about her o2 levels. They were low a few times in there hospital. Quit smoking in 2001.   OV 09/09/2019: Patient seen today for follow-up regarding her COPD.  Her symptoms are stable.  She denies chest pain nausea vomiting diarrhea.  She does have some shortness of breath with exertion.  She lives at home.  She is able to complete her activities of daily living.  She does have questions today regarding the Covid vaccine.  Patient was counseled on Covid vaccination and encouraged to obtain this next available opportunity.  OV 12/23/2019: Here today for follow-up regarding COPD.  Patient recently seen by Dr. Loanne Drilling for exacerbation and shortness of breath increasing shortness of breath.  She had inhaler change.  At this time doing well with the inhaler but still has some shortness of breath with significant exertion.  She wanted know if there is a different medication that may go to change to.  Denies weight loss denies hemoptysis.  She has follow-up scheduled with Dr. Earlie Server in July with a repeat CT scan.  OV 05/25/2020: Patient here today for follow-up regarding her COPD.  Overall doing well today.  Currently managed with her Trelegy inhaler.  She recently had palpitations and saw her cardiologist.  She has a Holter monitor in place attached to the left chest.  She has had a few fluttering events.  She does have dyspnea on exertion which has been stable for her.  She does need her flu  shot today.  She has had both of her COVID-19 vaccinations.  She denies fevers chills night sweats weight loss cough and sputum production.  OV 11/14/2020: here today for follow up for COPD. Using inhaler regularly. Recently seen in the ED for fast heart rate.she has followed up with Dr. Terrence Dupont from cardiology.  From a respiratory standpoint for the past couple of days she has had increased cough and sputum production.  Otherwise breathing better now.  She still  feels short of breath with most of her activities of daily living.  She uses her triple therapy inhaler regimen and as needed albuterol.  She has a nebulizer at home that she uses occasionally, approximately 2-3 times per week.    Past Medical History:  Diagnosis Date  . 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 Asthma   . 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  . Blood in stool    last week of aug 2018  . Carotid artery occlusion   . Complication of anesthesia   . COPD (chronic obstructive pulmonary disease) ([place])   . Coronary artery disease   . 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 Diverticulitis   . Dyspnea    with exertion  . Gastroesophageal reflux disease    occ  . Glaucoma    right eye  . Headache    tension  . Headache syndrome 11/27/2018  . Hiatal hernia   . Hyperlipidemia   . Hypertension   . Iliac artery aneurysm ([place])   . Lung cancer ([place]) dx 2018   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  . Myocardial infarction [facility]) 01/01/2000   Cardiac catheterization  . Peripheral vascular disease ([place])    stents in legs x 2 or 3  . Pneumonia    last time winter 2017 -2018  . PONV (postoperative nausea and vomiting)    occassionally, last colonscopy did ok with anesthesia  . Reflux   . Wears dentures    Full set  . Wears glasses      Family History  Problem Relation Age of Onset  . Heart disease Mother        Heart Disease before age 20  . Hypertension Mother   . Hyperlipidemia Mother   . Heart attack Mother   . Clotting disorder Mother   . Heart disease Father        Heart Disease before age 80  . Heart attack Father   . Hyperlipidemia Father   . Hypertension Father   . Heart disease Brother        Heart Disease before age 4  . Hyperlipidemia Brother   . Hypertension Brother   . Clotting disorder Brother   . AAA (abdominal aortic aneurysm) Brother   .  Cerebral aneurysm Sister   . Hypertension Sister   . AAA (abdominal aortic aneurysm) Sister   . Asthma Sister   . Cerebral aneurysm Brother   . Cancer Brother        Lung  . Hypertension Brother   . Heart attack Brother   . Heart disease Brother        Aneurysm of Brain  . Hypertension Brother   . Heart disease Brother   . Heart disease Brother   . Stroke Son        Aneurysm of Stomach  . AAA (abdominal aortic aneurysm) Son   . Cancer Maternal Uncle  great uncle/cancer/type unknown     Past Surgical History:  Procedure Laterality Date  . ABDOMINAL HYSTERECTOMY    . APPENDECTOMY    . Arnold-chiari malformation repair  1998   Suboccipital craniectomy  . CAROTID ENDARTERECTOMY  03/29/2010   Left  CEA  . CHOLECYSTECTOMY     Gall Bladder  . COLONOSCOPY WITH PROPOFOL N/A 04/22/2015   Procedure: COLONOSCOPY WITH PROPOFOL;  Surgeon: Carol Ada, MD;  Location: [facility];  Service: Endoscopy;  Laterality: N/A;  . COLONOSCOPY WITH PROPOFOL N/A 05/25/2016   Procedure: COLONOSCOPY WITH PROPOFOL;  Surgeon: Carol Ada, MD;  Location: [facility];  Service: Endoscopy;  Laterality: N/A;  . COLONOSCOPY WITH PROPOFOL N/A 05/03/2017   Procedure: COLONOSCOPY WITH PROPOFOL;  Surgeon: Carol Ada, MD;  Location: [facility];  Service: Endoscopy;  Laterality: N/A;  . COLONOSCOPY WITH PROPOFOL N/A 06/29/2020   Procedure: COLONOSCOPY WITH PROPOFOL;  Surgeon: Carol Ada, MD;  Location: [facility];  Service: Endoscopy;  Laterality: N/A;  . CORNEAL TRANSPLANT     Right  . CORONARY ARTERY BYPASS GRAFT  01/01/2000   x 3  . ENTEROSCOPY N/A 02/27/2018   Procedure: ENTEROSCOPY;  Surgeon: Carol Ada, MD;  [facility]: [facility];  Service: Endoscopy;  Laterality: N/A;  . ENTEROSCOPY N/A 07/18/2018   Procedure: ENTEROSCOPY;  Surgeon: Carol Ada, MD;  [facility]: [facility];  Service: Endoscopy;  Laterality: N/A;  . ENTEROSCOPY N/A 06/29/2020   Procedure: ENTEROSCOPY;  Surgeon: Carol Ada, MD;  [facility]: [facility];  Service: Endoscopy;  Laterality: N/A;  . ESOPHAGOGASTRODUODENOSCOPY N/A 05/25/2016   Procedure: ESOPHAGOGASTRODUODENOSCOPY (EGD);  Surgeon: Carol Ada, MD;  Location: [facility];  Service: Endoscopy;  Laterality: N/A;  . ESOPHAGOGASTRODUODENOSCOPY N/A 08/01/2018   Procedure: ESOPHAGOGASTRODUODENOSCOPY (EGD);  Surgeon: Carol Ada, MD;  Location: [facility];  Service: Endoscopy;  Laterality: N/A;  . EYE SURGERY Right 1995 or 1996   Laser surgery for retinal hemorrhage  . GIVENS CAPSULE STUDY N/A 07/16/2018   Procedure: GIVENS CAPSULE STUDY;  Surgeon: Carol Ada, MD;  [facility]: [facility];  Service: Endoscopy;  Laterality: N/A;  . HEMOSTASIS CLIP PLACEMENT  06/29/2020   Procedure: HEMOSTASIS CLIP PLACEMENT;  Surgeon: Carol Ada, MD;  [facility]: [facility];  Service: Endoscopy;;  . HOT HEMOSTASIS N/A 02/27/2018   Procedure: HOT HEMOSTASIS (ARGON PLASMA COAGULATION/BICAP);  Surgeon: Carol Ada, MD;  Location: [facility];  Service: Endoscopy;  Laterality: N/A;  . HOT HEMOSTASIS N/A 08/01/2018   Procedure: HOT HEMOSTASIS (ARGON PLASMA COAGULATION/BICAP);  Surgeon: Carol Ada, MD;  Location: [facility];  Service: Endoscopy;  Laterality: N/A;  . HOT HEMOSTASIS N/A 06/29/2020   Procedure: HOT HEMOSTASIS (ARGON PLASMA COAGULATION/BICAP);  Surgeon: Carol Ada, MD;  Location: [facility];  Service: Endoscopy;  Laterality: N/A;  . IR RADIOLOGIST EVAL & MGMT  12/14/2016  . LEFT HEART CATH AND CORS/GRAFTS ANGIOGRAPHY N/A 01/09/2018   Procedure: LEFT HEART CATH AND CORS/GRAFTS ANGIOGRAPHY;  Surgeon: Charolette Forward, MD;  Location: [facility];  Service: Cardiovascular;  Laterality: N/A;  . LEFT HEART CATHETERIZATION WITH CORONARY ANGIOGRAM N/A 08/03/2014   Procedure: LEFT HEART CATHETERIZATION WITH CORONARY ANGIOGRAM;  Surgeon: Birdie Riddle, MD;  Location: [facility];  Service: Cardiovascular;  Laterality: N/A;  . Post Coronary Artery   BPG  01/05/2000   Right jugular sheath removed  . PR VEIN BYPASS GRAFT,AORTO-FEM-POP    . ROTATOR CUFF REPAIR     Right    Social History   Socioeconomic History  . Marital status: Widowed    Spouse  name: Not on file  . Number of children: 4  . Years of education: 7TH  . Highest education level: Not on file  Occupational History  . Not on file  Tobacco Use  . Smoking status: Former Smoker    Packs/day: 1.50    Years: 30.00    Pack years: 45.00    Types: Cigarettes    Quit date: 08/13/2000    Years since quitting: 20.2  . Smokeless tobacco: Never Used  Vaping Use  . Vaping Use: Never used  Substance and Sexual Activity  . Alcohol use: No    Alcohol/week: 0.0 standard drinks  . Drug use: No  . Sexual activity: Never  Other Topics Concern  . Not on file  Social History Narrative   Lives at home w/ her son   Right-handed   Drinks coffee and [employer] daily   Social Determinants of Health   Financial Resource Strain: Not on file  Food Insecurity: Not on file  Transportation Needs: Not on file  Physical Activity: Not on file  Stress: Not on file  Social Connections: Not on file  Intimate Partner Violence: Not on file     Allergies  Allergen Reactions  . Azithromycin Swelling    Patient reported past history of lip swelling  . Codeine Other (See Comments)    Dr. Terrence Dupont advised patient not to take this medication  . Doxycycline Swelling    Mouth, lips, feet swelling  . Hydromorphone Palpitations and Other (See Comments)    DILAUDID  -  Pt had a Heart Attack after taking Dilaudid.  . Levaquin [Levofloxacin] Other (See Comments)    Chest pressure, SOB, "pain in between shoulder blades", sweaty -as reported by patient per experience in ED this afternoon  . Vitamin D Analogs Swelling  . Avelox [Moxifloxacin Hcl In Nacl] Palpitations    Caused Heart Attack   . Oxycodone-Acetaminophen Other (See Comments)    Says it makes her feel weird  . Risedronate Other (See  Comments)    Chest pain     Outpatient Medications Prior to Visit  Medication Sig Dispense Refill  . acetaminophen (TYLENOL) 325 MG tablet Take 650 mg by mouth every 6 (six) hours as needed for moderate pain or headache.     . albuterol (PROVENTIL) (2.5 MG/3ML) 0.083% nebulizer solution Take 3 mLs (2.5 mg total) by nebulization every 6 (six) hours as needed for wheezing or shortness of breath. 75 mL 12  . albuterol (VENTOLIN HFA) 108 (90 Base) MCG/ACT inhaler Inhale 2 puffs into the lungs every 6 (six) hours as needed for wheezing or shortness of breath. 8 g 6  . cetirizine (ZYRTEC) 10 MG tablet Take 10 mg by mouth daily as needed for allergies.     Marland Kitchen clopidogrel (PLAVIX) 75 MG tablet Take 1 tablet (75 mg total) by mouth daily. 90 tablet 3  . dexlansoprazole (DEXILANT) 60 MG capsule Take 1 capsule (60 mg total) by mouth daily before breakfast. 30 capsule 11  . docusate sodium (COLACE) 100 MG capsule Take 1 capsule (100 mg total) by mouth 2 (two) times daily as needed for mild constipation. 60 capsule 2  . Fluticasone-Umeclidin-Vilant (TRELEGY ELLIPTA) 200-62.5-25 MCG/INH AEPB Inhale 1 puff into the lungs daily. 60 each 6  . furosemide (LASIX) 20 MG tablet Take 1 tablet (20 mg total) by mouth daily. 90 tablet 1  . nitroGLYCERIN (NITROSTAT) 0.4 MG SL tablet Place 0.4 mg under the tongue every 5 (five) minutes as needed for chest pain.    Marland Kitchen  polyethylene glycol powder (MIRALAX) 17 GM/SCOOP powder Take 17 g by mouth 2 (two) times daily as needed for moderate constipation. 255 g 1  . rosuvastatin (CRESTOR) 10 MG tablet Take 10 mg by mouth at bedtime.     Marland Kitchen amiodarone (PACERONE) 200 MG tablet Take 100 mg by mouth daily. (Patient not taking: Reported on 11/14/2020)    . amLODipine-olmesartan (AZOR) 10-40 MG tablet Take 1 tablet by mouth daily. (Patient not taking: Reported on 11/14/2020)    . ferrous sulfate 325 (65 FE) MG EC tablet Take 1 tablet (325 mg total) by mouth 2 (two) times daily. (Patient not  taking: Reported on 11/14/2020) 180 tablet 3  . olmesartan (BENICAR) 20 MG tablet Take 1 tablet (20 mg total) by mouth daily. (Patient not taking: Reported on 11/14/2020) 90 tablet 1  . ondansetron (ZOFRAN-ODT) 4 MG disintegrating tablet Take 4 mg by mouth every 8 (eight) hours as needed for nausea/vomiting. (Patient not taking: Reported on 11/14/2020)    . spironolactone (ALDACTONE) 25 MG tablet Take 0.5 tablets (12.5 mg total) by mouth daily. (Patient not taking: Reported on 11/14/2020) 30 tablet 1   Facility-Administered Medications Prior to Visit  Medication Dose Route Frequency Provider Last Rate Last Admin  . promethazine (PHENERGAN) injection 6.25-12.5 mg  6.25-12.5 mg Intravenous Q15 min PRN Brennan Bailey, MD        Review of Systems  Constitutional: Negative for chills, fever, malaise/fatigue and weight loss.  HENT: Negative for hearing loss, sore throat and tinnitus.   Eyes: Negative for blurred vision and double vision.  Respiratory: Positive for cough and shortness of breath. Negative for hemoptysis, sputum production, wheezing and stridor.   Cardiovascular: Negative for chest pain, palpitations, orthopnea, leg swelling and PND.  Gastrointestinal: Negative for abdominal pain, constipation, diarrhea, heartburn, nausea and vomiting.  Genitourinary: Negative for dysuria, hematuria and urgency.  Musculoskeletal: Negative for joint pain and myalgias.  Skin: Negative for itching and rash.  Neurological: Negative for dizziness, tingling, weakness and headaches.  Endo/Heme/Allergies: Negative for environmental allergies. Does not bruise/bleed easily.  Psychiatric/Behavioral: Negative for depression. The patient is not nervous/anxious and does not have insomnia.   All other systems reviewed and are negative.    Objective:  Physical Exam Vitals reviewed.  Constitutional:      General: She is not in acute distress.    Appearance: She is well-developed.  HENT:     Head: Normocephalic and  atraumatic.  Eyes:     General: No scleral icterus.    Conjunctiva/sclera: Conjunctivae normal.     Pupils: Pupils are equal, round, and reactive to light.  Neck:     Vascular: No JVD.     Trachea: No tracheal deviation.  Cardiovascular:     Rate and Rhythm: Normal rate and regular rhythm.     Heart sounds: Normal heart sounds. No murmur heard.   Pulmonary:     Effort: Pulmonary effort is normal. No tachypnea, accessory muscle usage or respiratory distress.     Breath sounds: No stridor. No wheezing, rhonchi or rales.     Comments: dimished bilaterally   Abdominal:     General: Bowel sounds are normal. There is no distension.     Palpations: Abdomen is soft.     Tenderness: There is no abdominal tenderness.  Musculoskeletal:        General: No tenderness.     Cervical back: Neck supple.  Lymphadenopathy:     Cervical: No cervical adenopathy.  Skin:    General:  Skin is warm and dry.     Capillary Refill: Capillary refill takes less than 2 seconds.     Findings: No rash.  Neurological:     Mental Status: She is alert and oriented to person, place, and time.  Psychiatric:        Behavior: Behavior normal.     Vitals:   11/14/20 1319  BP: 118/62  Pulse: (!) 56  Temp: 97.6 F (36.4 C)  SpO2: 96%  Weight: 150 lb (68 kg)  Height: 5\' 2"  (1.575 m)   96% on RA BMI Readings from Last 3 Encounters:  11/14/20 27.44 kg/m  11/08/20 27.07 kg/m  09/07/20 27.78 kg/m   Wt Readings from Last 3 Encounters:  11/14/20 150 lb (68 kg)  11/08/20 148 lb (67.1 kg)  09/07/20 151 lb 14.4 oz (68.9 kg)   Chest Imaging:  08/25/2018 CT Chest:  Imaging reviewed.  Right lower lobe areas of groundglass and scarring status post sterotactic radiation likely radiation-induced parenchymal scarring.  Small nodular density within this area 7 mm in size.  She also has a new associated/adjacent rib fracture within that right side which may be concerning and would need to be followed for any  recurrence of squamous cell disease. The patient's images have been independently reviewed by me.    02/23/2019 CT Chest: Follow up scheduled for cancer eval and staging   Chest x-ray 11/10/2019: Chronic scarring in the bases.  Mildly hyperinflated lung fields consistent with COPD diagnosis. The patient's images have been independently reviewed by me.    CXR 11/08/2020: Right chest density, persistent consistent with prior cancer location  The patient's images have been independently reviewed by me.       Pulmonary Functions Testing Results: PFT Results Latest Ref Rng & Units 08/26/2018 02/14/2018 04/10/2016  FVC-Pre L 1.69 1.80 1.53  FVC-Predicted Pre % 65 69 57  FVC-Post L 1.91 1.99 1.82  FVC-Predicted Post % 74 77 69  Pre FEV1/FVC % % 67 72 71  Post FEV1/FCV % % 66 72 70  FEV1-Pre L 1.12 1.30 1.09  FEV1-Predicted Pre % 58 67 54  FEV1-Post L 1.26 1.43 1.28  DLCO uncorrected ml/min/mmHg 11.93 - 11.07  DLCO UNC% % 55 - 51  DLCO corrected ml/min/mmHg 13.35 - -  DLCO COR %Predicted % 61 - -  DLVA Predicted % 97 - 86  TLC L 3.85 - 3.77  TLC % Predicted % 81 - 79  RV % Predicted % 94 - 96     FeNO: None   Pathology:   Squamous cell carcinoma of the lung right lower lobe status post stereotactic radiation therapy  Echocardiogram: none recent   Heart Catheterization:   Jan 09, 2018: Dr. Nelta Numbers LAD to Prox LAD lesion is 80% stenosed.  Ost 1st Diag lesion is 100% stenosed.  Prox RCA lesion is 20% stenosed.  Mid RCA lesion is 30% stenosed.  Mid LM to Dist LM lesion is 40% stenosed.  Ost Ramus lesion is 40% stenosed.  Ost Cx lesion is 40% stenosed.  Origin lesion is 100% stenosed.  Origin to Prox Graft lesion is 100% stenosed.  The left ventricular systolic function is normal.  LV end diastolic pressure is normal.  The left ventricular ejection fraction is 50-55% by visual estimate.    Assessment & Plan:     ICD-10-CM   1. Stage 2 moderate COPD by GOLD  classification ([place])  J44.9   2. Primary cancer of right lower lobe of lung (  [place])  C34.31   3. Hx of radiation therapy  Z92.3   4. Hx of cancer of lung  Z85.118   5. S/P CABG x 3  Z95.1     Assessment:   This is a 80 year old female past medical history of stage II COPD, history of right lower lobe lung cancer status post radiation, SBRT.  History of CABG x3.  Recent issues with heart rate managed by cardiology.  Plan: At this time would continue Trelegy inhaler Refills as needed. Continue albuterol for shortness of breath and wheezing. Refills for albuterol neb solution as needed. Reviewed recent chest x-ray from ER visit. I spoke with PA from oncology regarding follow-up.  I think the image looks very similar to her previous imaging and I think that her follow-up CT scan in July is appropriate.  No need for CT at this time. Patient was counseled on these recommendations.  Follow-up with [place] 6 months or as needed.    Current Outpatient Medications:  .  acetaminophen (TYLENOL) 325 MG tablet, Take 650 mg by mouth every 6 (six) hours as needed for moderate pain or headache. , Disp: , Rfl:  .  albuterol (PROVENTIL) (2.5 MG/3ML) 0.083% nebulizer solution, Take 3 mLs (2.5 mg total) by nebulization every 6 (six) hours as needed for wheezing or shortness of breath., Disp: 75 mL, Rfl: 12 .  albuterol (VENTOLIN HFA) 108 (90 Base) MCG/ACT inhaler, Inhale 2 puffs into the lungs every 6 (six) hours as needed for wheezing or shortness of breath., Disp: 8 g, Rfl: 6 .  cetirizine (ZYRTEC) 10 MG tablet, Take 10 mg by mouth daily as needed for allergies. , Disp: , Rfl:  .  clopidogrel (PLAVIX) 75 MG tablet, Take 1 tablet (75 mg total) by mouth daily., Disp: 90 tablet, Rfl: 3 .  dexlansoprazole (DEXILANT) 60 MG capsule, Take 1 capsule (60 mg total) by mouth daily before breakfast., Disp: 30 capsule, Rfl: 11 .  docusate sodium (COLACE) 100 MG capsule, Take 1 capsule (100 mg total) by mouth 2 (two) times  daily as needed for mild constipation., Disp: 60 capsule, Rfl: 2 .  Fluticasone-Umeclidin-Vilant (TRELEGY ELLIPTA) 200-62.5-25 MCG/INH AEPB, Inhale 1 puff into the lungs daily., Disp: 60 each, Rfl: 6 .  furosemide (LASIX) 20 MG tablet, Take 1 tablet (20 mg total) by mouth daily., Disp: 90 tablet, Rfl: 1 .  nitroGLYCERIN (NITROSTAT) 0.4 MG SL tablet, Place 0.4 mg under the tongue every 5 (five) minutes as needed for chest pain., Disp: , Rfl:  .  polyethylene glycol powder (MIRALAX) 17 GM/SCOOP powder, Take 17 g by mouth 2 (two) times daily as needed for moderate constipation., Disp: 255 g, Rfl: 1 .  rosuvastatin (CRESTOR) 10 MG tablet, Take 10 mg by mouth at bedtime. , Disp: , Rfl:  .  amiodarone (PACERONE) 200 MG tablet, Take 100 mg by mouth daily. (Patient not taking: Reported on 11/14/2020), Disp: , Rfl:  .  amLODipine-olmesartan (AZOR) 10-40 MG tablet, Take 1 tablet by mouth daily. (Patient not taking: Reported on 11/14/2020), Disp: , Rfl:  .  ferrous sulfate 325 (65 FE) MG EC tablet, Take 1 tablet (325 mg total) by mouth 2 (two) times daily. (Patient not taking: Reported on 11/14/2020), Disp: 180 tablet, Rfl: 3 .  olmesartan (BENICAR) 20 MG tablet, Take 1 tablet (20 mg total) by mouth daily. (Patient not taking: Reported on 11/14/2020), Disp: 90 tablet, Rfl: 1 .  ondansetron (ZOFRAN-ODT) 4 MG disintegrating tablet, Take 4 mg by mouth every 8 (eight)  hours as needed for nausea/vomiting. (Patient not taking: Reported on 11/14/2020), Disp: , Rfl:  .  spironolactone (ALDACTONE) 25 MG tablet, Take 0.5 tablets (12.5 mg total) by mouth daily. (Patient not taking: Reported on 11/14/2020), Disp: 30 tablet, Rfl: 1 No current facility-administered medications for this visit.  Facility-Administered Medications Ordered in Other Visits:  .  promethazine (PHENERGAN) injection 6.25-12.5 mg, 6.25-12.5 mg, Intravenous, Q15 min PRN, Howze, Leanord Hawking, MD   I spent 30 minutes dedicated to the care of this patient on the date  of this encounter to include pre-visit review of records, face-to-face time with the patient discussing conditions above, post visit ordering of testing, clinical documentation with the electronic health record, making appropriate referrals as documented, and communicating necessary findings to members of the patients care team.     Garner Nash, [facility] 11/14/2020 1:42 PM

## 2020-11-14 NOTE — Patient Instructions (Addendum)
Thank you for visiting Dr. Valeta Harms at [facility]. Today we recommend the following:  Continue trelegy  Continue albuterol as needed   Return in about 6 months (around 05/16/2021) for with APP or Dr. Valeta Harms.    Please do your part to reduce the spread of COVID-19.

## 2020-11-14 NOTE — Progress Notes (Signed)
Synopsis: Referred in January 2020 to establish care, former patient of Dr. Lenna Gilford   Subjective:   PATIENT ID: [name] GENDER: female DOB: 01-Dec-1940, MRN: [id number]  Chief Complaint  Patient presents with  . Follow-up    Allergies. Sob same as last visit. Productive cough with white mucus started last week. No wheezing.     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  She is followed with Dr. Earlie Server and Dr. Tammi Klippel.  In the oncology services.  She has an appointment with him tomorrow.  She had a recent CT scan completed yesterday which reveals right lower lobe groundglass opacities and scarring after stereotactic radiation to the right lower lobe.  She also has a small 7 mm central nodular location that will need to be followed.  She had PFTs completed today prior to her office visit.  The pulmonary function test revealed moderate COPD with an FEV1 65% postbronchodilator reduced ratio of 66, significant bronchodilator response with greater than 12% improvement, as well as a reduced DLCO 55% predicted.  Patient states today that she quit using her Anora inhaler several months ago because she did not know if it was making much difference in her breathing symptoms.  Before that she liked being on it.  There was some confusion regarding stopping blood pressure medication Azor versus stopping her Anoro.  She may have misunderstood that that information regarding changing of medications.  However she does have a stock supply of an oral at home that she could restart.  She did think it made a little bit of difference but was not sure.  At baseline she is dyspneic on exertion but she does not have any significant cough or daily sputum production.  OV 02/19/2019: recently in the hospital for TIA symptoms. No stroke. Now on plavix, taken off ASA. Followed planned with Dr. Julien Nordmann from  oncology on 15th. Doing well since hospitalization. Concerned about her o2 levels. They were low a few times in there hospital. Quit smoking in 2001.   OV 09/09/2019: Patient seen today for follow-up regarding her COPD.  Her symptoms are stable.  She denies chest pain nausea vomiting diarrhea.  She does have some shortness of breath with exertion.  She lives at home.  She is able to complete her activities of daily living.  She does have questions today regarding the Covid vaccine.  Patient was counseled on Covid vaccination and encouraged to obtain this next available opportunity.  OV 12/23/2019: Here today for follow-up regarding COPD.  Patient recently seen by Dr. Loanne Drilling for exacerbation and shortness of breath increasing shortness of breath.  She had inhaler change.  At this time doing well with the inhaler but still has some shortness of breath with significant exertion.  She wanted know if there is a different medication that may go to change to.  Denies weight loss denies hemoptysis.  She has follow-up scheduled with Dr. Earlie Server in July with a repeat CT scan.  OV 05/25/2020: Patient here today for follow-up regarding her COPD.  Overall doing well today.  Currently managed with her Trelegy inhaler.  She recently had palpitations and saw her cardiologist.  She has a Holter monitor in place attached to the left chest.  She has had a few fluttering events.  She does have dyspnea on exertion which has been stable for her.  She does need her flu  shot today.  She has had both of her COVID-19 vaccinations.  She denies fevers chills night sweats weight loss cough and sputum production.      Past Medical History:  Diagnosis Date  . 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 Asthma   . 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  . Blood in stool    last week of aug 2018  . Carotid artery occlusion   .  Complication of anesthesia   . COPD (chronic obstructive pulmonary disease) ([place])   . Coronary artery disease   . 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 Diverticulitis   . Dyspnea    with exertion  . Gastroesophageal reflux disease    occ  . Glaucoma    right eye  . Headache    tension  . Headache syndrome 11/27/2018  . Hiatal hernia   . Hyperlipidemia   . Hypertension   . Iliac artery aneurysm ([place])   . Lung cancer ([place]) dx 2018   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  . Myocardial infarction [facility]) 01/01/2000   Cardiac catheterization  . Peripheral vascular disease ([place])    stents in legs x 2 or 3  . Pneumonia    last time winter 2017 -2018  . PONV (postoperative nausea and vomiting)    occassionally, last colonscopy did ok with anesthesia  . Reflux   . Wears dentures    Full set  . Wears glasses      Family History  Problem Relation Age of Onset  . Heart disease Mother        Heart Disease before age 25  . Hypertension Mother   . Hyperlipidemia Mother   . Heart attack Mother   . Clotting disorder Mother   . Heart disease Father        Heart Disease before age 26  . Heart attack Father   . Hyperlipidemia Father   . Hypertension Father   . Heart disease Brother        Heart Disease before age 73  . Hyperlipidemia Brother   . Hypertension Brother   . Clotting disorder Brother   . AAA (abdominal aortic aneurysm) Brother   . Cerebral aneurysm Sister   . Hypertension Sister   . AAA (abdominal aortic aneurysm) Sister   . Asthma Sister   . Cerebral aneurysm Brother   . Cancer Brother        Lung  . Hypertension Brother   . Heart attack Brother   . Heart disease Brother        Aneurysm of Brain  . Hypertension Brother   . Heart disease Brother   . Heart disease Brother   . Stroke Son        Aneurysm of Stomach  . AAA (abdominal aortic aneurysm) Son   . Cancer Maternal Uncle        great uncle/cancer/type unknown     Past Surgical History:   Procedure Laterality Date  . ABDOMINAL HYSTERECTOMY    . APPENDECTOMY    . Arnold-chiari malformation repair  1998   Suboccipital craniectomy  . CAROTID ENDARTERECTOMY  03/29/2010   Left  CEA  . CHOLECYSTECTOMY     Gall Bladder  . COLONOSCOPY WITH PROPOFOL N/A 04/22/2015   Procedure: COLONOSCOPY WITH PROPOFOL;  Surgeon: Carol Ada, MD;  Location: [facility];  Service: Endoscopy;  Laterality: N/A;  . COLONOSCOPY WITH PROPOFOL N/A  05/25/2016   Procedure: COLONOSCOPY WITH PROPOFOL;  Surgeon: Carol Ada, MD;  Location: [facility];  Service: Endoscopy;  Laterality: N/A;  . COLONOSCOPY WITH PROPOFOL N/A 05/03/2017   Procedure: COLONOSCOPY WITH PROPOFOL;  Surgeon: Carol Ada, MD;  Location: [facility];  Service: Endoscopy;  Laterality: N/A;  . COLONOSCOPY WITH PROPOFOL N/A 06/29/2020   Procedure: COLONOSCOPY WITH PROPOFOL;  Surgeon: Carol Ada, MD;  Location: [facility];  Service: Endoscopy;  Laterality: N/A;  . CORNEAL TRANSPLANT     Right  . CORONARY ARTERY BYPASS GRAFT  01/01/2000   x 3  . ENTEROSCOPY N/A 02/27/2018   Procedure: ENTEROSCOPY;  Surgeon: Carol Ada, MD;  Location: [facility];  Service: Endoscopy;  Laterality: N/A;  . ENTEROSCOPY N/A 07/18/2018   Procedure: ENTEROSCOPY;  Surgeon: Carol Ada, MD;  Location: [facility];  Service: Endoscopy;  Laterality: N/A;  . ENTEROSCOPY N/A 06/29/2020   Procedure: ENTEROSCOPY;  Surgeon: Carol Ada, MD;  Location: [facility];  Service: Endoscopy;  Laterality: N/A;  . ESOPHAGOGASTRODUODENOSCOPY N/A 05/25/2016   Procedure: ESOPHAGOGASTRODUODENOSCOPY (EGD);  Surgeon: Carol Ada, MD;  Location: [facility];  Service: Endoscopy;  Laterality: N/A;  . ESOPHAGOGASTRODUODENOSCOPY N/A 08/01/2018   Procedure: ESOPHAGOGASTRODUODENOSCOPY (EGD);  Surgeon: Carol Ada, MD;  Location: [facility];  Service: Endoscopy;  Laterality: N/A;  . EYE SURGERY Right 1995 or 1996   Laser surgery for retinal hemorrhage  . GIVENS  CAPSULE STUDY N/A 07/16/2018   Procedure: GIVENS CAPSULE STUDY;  Surgeon: Carol Ada, MD;  Location: [facility];  Service: Endoscopy;  Laterality: N/A;  . HEMOSTASIS CLIP PLACEMENT  06/29/2020   Procedure: HEMOSTASIS CLIP PLACEMENT;  Surgeon: Carol Ada, MD;  Location: [facility];  Service: Endoscopy;;  . HOT HEMOSTASIS N/A 02/27/2018   Procedure: HOT HEMOSTASIS (ARGON PLASMA COAGULATION/BICAP);  Surgeon: Carol Ada, MD;  Location: [facility];  Service: Endoscopy;  Laterality: N/A;  . HOT HEMOSTASIS N/A 08/01/2018   Procedure: HOT HEMOSTASIS (ARGON PLASMA COAGULATION/BICAP);  Surgeon: Carol Ada, MD;  Location: [facility];  Service: Endoscopy;  Laterality: N/A;  . HOT HEMOSTASIS N/A 06/29/2020   Procedure: HOT HEMOSTASIS (ARGON PLASMA COAGULATION/BICAP);  Surgeon: Carol Ada, MD;  Location: [facility];  Service: Endoscopy;  Laterality: N/A;  . IR RADIOLOGIST EVAL & MGMT  12/14/2016  . LEFT HEART CATH AND CORS/GRAFTS ANGIOGRAPHY N/A 01/09/2018   Procedure: LEFT HEART CATH AND CORS/GRAFTS ANGIOGRAPHY;  Surgeon: Charolette Forward, MD;  Location: [facility];  Service: Cardiovascular;  Laterality: N/A;  . LEFT HEART CATHETERIZATION WITH CORONARY ANGIOGRAM N/A 08/03/2014   Procedure: LEFT HEART CATHETERIZATION WITH CORONARY ANGIOGRAM;  Surgeon: Birdie Riddle, MD;  Location: [facility];  Service: Cardiovascular;  Laterality: N/A;  . Post Coronary Artery  BPG  01/05/2000   Right jugular sheath removed  . PR VEIN BYPASS GRAFT,AORTO-FEM-POP    . ROTATOR CUFF REPAIR     Right    Social History   Socioeconomic History  . Marital status: Widowed    Spouse name: Not on file  . Number of children: 4  . Years of education: 7TH  . Highest education level: Not on file  Occupational History  . Not on file  Tobacco Use  . Smoking status: Former Smoker    Packs/day: 1.50    Years: 30.00    Pack years: 45.00    Types: Cigarettes    Quit date: 08/13/2000    Years since  quitting: 20.2  . Smokeless tobacco: Never Used  Vaping Use  . Vaping Use: Never used  Substance and Sexual Activity  . Alcohol use: No    Alcohol/week: 0.0 standard drinks  . Drug use: No  . Sexual activity: Never  Other Topics Concern  . Not on file  Social History Narrative   Lives at home w/ her son   Right-handed   Drinks coffee and [employer] daily   Social Determinants of Health   Financial Resource Strain: Not on file  Food Insecurity: Not on file  Transportation Needs: Not on file  Physical Activity: Not on file  Stress: Not on file  Social Connections: Not on file  Intimate Partner Violence: Not on file     Allergies  Allergen Reactions  . Azithromycin Swelling    Patient reported past history of lip swelling  . Codeine Other (See Comments)    Dr. Terrence Dupont advised patient not to take this medication  . Doxycycline Swelling    Mouth, lips, feet swelling  . Hydromorphone Palpitations and Other (See Comments)    DILAUDID  -  Pt had a Heart Attack after taking Dilaudid.  . Levaquin [Levofloxacin] Other (See Comments)    Chest pressure, SOB, "pain in between shoulder blades", sweaty -as reported by patient per experience in ED this afternoon  . Vitamin D Analogs Swelling  . Avelox [Moxifloxacin Hcl In Nacl] Palpitations    Caused Heart Attack   . Oxycodone-Acetaminophen Other (See Comments)    Says it makes her feel weird  . Risedronate Other (See Comments)    Chest pain     Outpatient Medications Prior to Visit  Medication Sig Dispense Refill  . acetaminophen (TYLENOL) 325 MG tablet Take 650 mg by mouth every 6 (six) hours as needed for moderate pain or headache.     . albuterol (PROVENTIL) (2.5 MG/3ML) 0.083% nebulizer solution Take 3 mLs (2.5 mg total) by nebulization every 6 (six) hours as needed for wheezing or shortness of breath. 75 mL 12  . albuterol (VENTOLIN HFA) 108 (90 Base) MCG/ACT inhaler Inhale 2 puffs into the lungs every 6 (six) hours as needed for  wheezing or shortness of breath. 8 g 6  . cetirizine (ZYRTEC) 10 MG tablet Take 10 mg by mouth daily as needed for allergies.     Marland Kitchen clopidogrel (PLAVIX) 75 MG tablet Take 1 tablet (75 mg total) by mouth daily. 90 tablet 3  . dexlansoprazole (DEXILANT) 60 MG capsule Take 1 capsule (60 mg total) by mouth daily before breakfast. 30 capsule 11  . docusate sodium (COLACE) 100 MG capsule Take 1 capsule (100 mg total) by mouth 2 (two) times daily as needed for mild constipation. 60 capsule 2  . Fluticasone-Umeclidin-Vilant (TRELEGY ELLIPTA) 200-62.5-25 MCG/INH AEPB Inhale 1 puff into the lungs daily. 60 each 6  . furosemide (LASIX) 20 MG tablet Take 1 tablet (20 mg total) by mouth daily. 90 tablet 1  . nitroGLYCERIN (NITROSTAT) 0.4 MG SL tablet Place 0.4 mg under the tongue every 5 (five) minutes as needed for chest pain.    . polyethylene glycol powder (MIRALAX) 17 GM/SCOOP powder Take 17 g by mouth 2 (two) times daily as needed for moderate constipation. 255 g 1  . rosuvastatin (CRESTOR) 10 MG tablet Take 10 mg by mouth at bedtime.     Marland Kitchen amiodarone (PACERONE) 200 MG tablet Take 100 mg by mouth daily. (Patient not taking: Reported on 11/14/2020)    . amLODipine-olmesartan (AZOR) 10-40 MG tablet Take 1 tablet by mouth daily. (Patient not taking: Reported on 11/14/2020)    . ferrous sulfate  325 (65 FE) MG EC tablet Take 1 tablet (325 mg total) by mouth 2 (two) times daily. (Patient not taking: Reported on 11/14/2020) 180 tablet 3  . olmesartan (BENICAR) 20 MG tablet Take 1 tablet (20 mg total) by mouth daily. (Patient not taking: Reported on 11/14/2020) 90 tablet 1  . ondansetron (ZOFRAN-ODT) 4 MG disintegrating tablet Take 4 mg by mouth every 8 (eight) hours as needed for nausea/vomiting. (Patient not taking: Reported on 11/14/2020)    . spironolactone (ALDACTONE) 25 MG tablet Take 0.5 tablets (12.5 mg total) by mouth daily. (Patient not taking: Reported on 11/14/2020) 30 tablet 1   Facility-Administered Medications  Prior to Visit  Medication Dose Route Frequency Provider Last Rate Last Admin  . promethazine (PHENERGAN) injection 6.25-12.5 mg  6.25-12.5 mg Intravenous Q15 min PRN Brennan Bailey, MD        Review of Systems  Constitutional: Negative for chills, fever, malaise/fatigue and weight loss.  HENT: Negative for hearing loss, sore throat and tinnitus.   Eyes: Negative for blurred vision and double vision.  Respiratory: Positive for cough and shortness of breath. Negative for hemoptysis, sputum production, wheezing and stridor.   Cardiovascular: Negative for chest pain, palpitations, orthopnea, leg swelling and PND.  Gastrointestinal: Negative for abdominal pain, constipation, diarrhea, heartburn, nausea and vomiting.  Genitourinary: Negative for dysuria, hematuria and urgency.  Musculoskeletal: Negative for joint pain and myalgias.  Skin: Negative for itching and rash.  Neurological: Negative for dizziness, tingling, weakness and headaches.  Endo/Heme/Allergies: Negative for environmental allergies. Does not bruise/bleed easily.  Psychiatric/Behavioral: Negative for depression. The patient is not nervous/anxious and does not have insomnia.   All other systems reviewed and are negative.    Objective:  Physical Exam Vitals reviewed.  Constitutional:      General: She is not in acute distress.    Appearance: She is well-developed.  HENT:     Head: Normocephalic and atraumatic.     Mouth/Throat:     Pharynx: No oropharyngeal exudate.  Eyes:     Conjunctiva/sclera: Conjunctivae normal.     Pupils: Pupils are equal, round, and reactive to light.  Neck:     Vascular: No JVD.     Trachea: No tracheal deviation.     Comments: Loss of supraclavicular fat Cardiovascular:     Rate and Rhythm: Normal rate and regular rhythm.     Heart sounds: S1 normal and S2 normal.     Comments: Distant heart tones Pulmonary:     Effort: No tachypnea or accessory muscle usage.     Breath sounds: No  stridor. Decreased breath sounds (throughout all lung fields) present. No wheezing, rhonchi or rales.  Abdominal:     General: Bowel sounds are normal. There is no distension.     Palpations: Abdomen is soft.     Tenderness: There is no abdominal tenderness.  Musculoskeletal:        General: No deformity (muscle wasting ).  Skin:    General: Skin is warm and dry.     Capillary Refill: Capillary refill takes less than 2 seconds.     Findings: No rash.  Neurological:     Mental Status: She is alert and oriented to person, place, and time.  Psychiatric:        Behavior: Behavior normal.     Vitals:   11/14/20 1319  BP: 118/62  Pulse: (!) 56  Temp: 97.6 F (36.4 C)  SpO2: 96%  Weight: 150 lb (68 kg)  Height: 5'  2" (1.575 m)   96% on RA BMI Readings from Last 3 Encounters:  11/14/20 27.44 kg/m  11/08/20 27.07 kg/m  09/07/20 27.78 kg/m   Wt Readings from Last 3 Encounters:  11/14/20 150 lb (68 kg)  11/08/20 148 lb (67.1 kg)  09/07/20 151 lb 14.4 oz (68.9 kg)   Chest Imaging:  08/25/2018 CT Chest:  Imaging reviewed.  Right lower lobe areas of groundglass and scarring status post sterotactic radiation likely radiation-induced parenchymal scarring.  Small nodular density within this area 7 mm in size.  She also has a new associated/adjacent rib fracture within that right side which may be concerning and would need to be followed for any recurrence of squamous cell disease. The patient's images have been independently reviewed by me.    02/23/2019 CT Chest: Follow up scheduled for cancer eval and staging   Chest x-ray 11/10/2019: Chronic scarring in the bases.  Mildly hyperinflated lung fields consistent with COPD diagnosis. The patient's images have been independently reviewed by me.     Pulmonary Functions Testing Results: PFT Results Latest Ref Rng & Units 08/26/2018 02/14/2018 04/10/2016  FVC-Pre L 1.69 1.80 1.53  FVC-Predicted Pre % 65 69 57  FVC-Post L 1.91 1.99 1.82   FVC-Predicted Post % 74 77 69  Pre FEV1/FVC % % 67 72 71  Post FEV1/FCV % % 66 72 70  FEV1-Pre L 1.12 1.30 1.09  FEV1-Predicted Pre % 58 67 54  FEV1-Post L 1.26 1.43 1.28  DLCO uncorrected ml/min/mmHg 11.93 - 11.07  DLCO UNC% % 55 - 51  DLCO corrected ml/min/mmHg 13.35 - -  DLCO COR %Predicted % 61 - -  DLVA Predicted % 97 - 86  TLC L 3.85 - 3.77  TLC % Predicted % 81 - 79  RV % Predicted % 94 - 96     FeNO: None   Pathology:   Squamous cell carcinoma of the lung right lower lobe status post stereotactic radiation therapy  Echocardiogram: none recent   Heart Catheterization:   Jan 09, 2018: Dr. Nelta Numbers LAD to Prox LAD lesion is 80% stenosed.  Ost 1st Diag lesion is 100% stenosed.  Prox RCA lesion is 20% stenosed.  Mid RCA lesion is 30% stenosed.  Mid LM to Dist LM lesion is 40% stenosed.  Ost Ramus lesion is 40% stenosed.  Ost Cx lesion is 40% stenosed.  Origin lesion is 100% stenosed.  Origin to Prox Graft lesion is 100% stenosed.  The left ventricular systolic function is normal.  LV end diastolic pressure is normal.  The left ventricular ejection fraction is 50-55% by visual estimate.    Assessment & Plan:   No diagnosis found.  Assessment:   This is a 80 year old female past medical history of stage II COPD, history of right lower lobe lung cancer, status post radiation.  History of coronary artery disease status post CABG currently on antiplatelet therapy.  Multiple stable chronic illnesses that lead to her dyspnea on exertion and shortness of breath.  Plan: Continue Trelegy inhaler Refills given today of this Continue albuterol for shortness of breath and wheezing as needed. Refills of albuterol short acting HFA inhaler as well as nebulizer solution. She has a functional nebulizer at home. Congratulated her on her COVID-19 vaccination. She does need her flu shot today. We will give her this.  Can return to clinic in 6 months for  follow-up.    Current Outpatient Medications:  .  acetaminophen (TYLENOL) 325 MG tablet, Take 650 mg  by mouth every 6 (six) hours as needed for moderate pain or headache. , Disp: , Rfl:  .  albuterol (PROVENTIL) (2.5 MG/3ML) 0.083% nebulizer solution, Take 3 mLs (2.5 mg total) by nebulization every 6 (six) hours as needed for wheezing or shortness of breath., Disp: 75 mL, Rfl: 12 .  albuterol (VENTOLIN HFA) 108 (90 Base) MCG/ACT inhaler, Inhale 2 puffs into the lungs every 6 (six) hours as needed for wheezing or shortness of breath., Disp: 8 g, Rfl: 6 .  cetirizine (ZYRTEC) 10 MG tablet, Take 10 mg by mouth daily as needed for allergies. , Disp: , Rfl:  .  clopidogrel (PLAVIX) 75 MG tablet, Take 1 tablet (75 mg total) by mouth daily., Disp: 90 tablet, Rfl: 3 .  dexlansoprazole (DEXILANT) 60 MG capsule, Take 1 capsule (60 mg total) by mouth daily before breakfast., Disp: 30 capsule, Rfl: 11 .  docusate sodium (COLACE) 100 MG capsule, Take 1 capsule (100 mg total) by mouth 2 (two) times daily as needed for mild constipation., Disp: 60 capsule, Rfl: 2 .  Fluticasone-Umeclidin-Vilant (TRELEGY ELLIPTA) 200-62.5-25 MCG/INH AEPB, Inhale 1 puff into the lungs daily., Disp: 60 each, Rfl: 6 .  furosemide (LASIX) 20 MG tablet, Take 1 tablet (20 mg total) by mouth daily., Disp: 90 tablet, Rfl: 1 .  nitroGLYCERIN (NITROSTAT) 0.4 MG SL tablet, Place 0.4 mg under the tongue every 5 (five) minutes as needed for chest pain., Disp: , Rfl:  .  polyethylene glycol powder (MIRALAX) 17 GM/SCOOP powder, Take 17 g by mouth 2 (two) times daily as needed for moderate constipation., Disp: 255 g, Rfl: 1 .  rosuvastatin (CRESTOR) 10 MG tablet, Take 10 mg by mouth at bedtime. , Disp: , Rfl:  .  amiodarone (PACERONE) 200 MG tablet, Take 100 mg by mouth daily. (Patient not taking: Reported on 11/14/2020), Disp: , Rfl:  .  amLODipine-olmesartan (AZOR) 10-40 MG tablet, Take 1 tablet by mouth daily. (Patient not taking: Reported on  11/14/2020), Disp: , Rfl:  .  ferrous sulfate 325 (65 FE) MG EC tablet, Take 1 tablet (325 mg total) by mouth 2 (two) times daily. (Patient not taking: Reported on 11/14/2020), Disp: 180 tablet, Rfl: 3 .  olmesartan (BENICAR) 20 MG tablet, Take 1 tablet (20 mg total) by mouth daily. (Patient not taking: Reported on 11/14/2020), Disp: 90 tablet, Rfl: 1 .  ondansetron (ZOFRAN-ODT) 4 MG disintegrating tablet, Take 4 mg by mouth every 8 (eight) hours as needed for nausea/vomiting. (Patient not taking: Reported on 11/14/2020), Disp: , Rfl:  .  spironolactone (ALDACTONE) 25 MG tablet, Take 0.5 tablets (12.5 mg total) by mouth daily. (Patient not taking: Reported on 11/14/2020), Disp: 30 tablet, Rfl: 1 No current facility-administered medications for this visit.  Facility-Administered Medications Ordered in Other Visits:  .  promethazine (PHENERGAN) injection 6.25-12.5 mg, 6.25-12.5 mg, Intravenous, Q15 min PRN, Howze, Leanord Hawking, MD  I spent 31 minutes dedicated to the care of this patient on the date of this encounter to include pre-visit review of records, face-to-face time with the patient discussing conditions above, post visit ordering of testing, clinical documentation with the electronic health record, making appropriate referrals as documented, and communicating necessary findings to members of the patients care team.    Garner Nash, [facility] 11/14/2020 1:39 PM

## 2020-12-28 ENCOUNTER — Other Ambulatory Visit: Payer: Self-pay | Admitting: Gastroenterology

## 2021-01-02 NOTE — Progress Notes (Signed)
Attempted to obtain medical history via telephone, unable to reach at this time. I left a voicemail to return pre surgical testing department's phone call.  

## 2021-01-04 ENCOUNTER — Encounter (HOSPITAL_COMMUNITY): Payer: Self-pay | Admitting: Gastroenterology

## 2021-01-05 ENCOUNTER — Other Ambulatory Visit: Payer: Self-pay

## 2021-01-05 ENCOUNTER — Encounter (HOSPITAL_COMMUNITY)
Admission: RE | Disposition: A | Discharge status: Home / Self Care | Payer: Self-pay | Source: Home / Self Care | Attending: Gastroenterology

## 2021-01-05 ENCOUNTER — Ambulatory Visit (HOSPITAL_COMMUNITY): Payer: Medicare Other | Admitting: Anesthesiology

## 2021-01-05 ENCOUNTER — Encounter (HOSPITAL_COMMUNITY): Payer: Self-pay | Admitting: Gastroenterology

## 2021-01-05 ENCOUNTER — Ambulatory Visit (HOSPITAL_COMMUNITY)
Admission: RE | Admit: 2021-01-05 | Discharge: 2021-01-05 | Disposition: A | Discharge status: Home / Self Care | Payer: Medicare Other | Attending: Gastroenterology | Admitting: Gastroenterology

## 2021-01-05 DIAGNOSIS — Z947 Corneal transplant status: Secondary | ICD-10-CM | POA: Diagnosis not present

## 2021-01-05 DIAGNOSIS — Z95820 Peripheral vascular angioplasty status with implants and grafts: Secondary | ICD-10-CM | POA: Diagnosis not present

## 2021-01-05 DIAGNOSIS — Z888 Allergy status to other drugs, medicaments and biological substances status: Secondary | ICD-10-CM | POA: Diagnosis not present

## 2021-01-05 DIAGNOSIS — Z87891 Personal history of nicotine dependence: Secondary | ICD-10-CM | POA: Insufficient documentation

## 2021-01-05 DIAGNOSIS — Z951 Presence of aortocoronary bypass graft: Secondary | ICD-10-CM | POA: Insufficient documentation

## 2021-01-05 DIAGNOSIS — K5520 Angiodysplasia of colon without hemorrhage: Secondary | ICD-10-CM | POA: Diagnosis not present

## 2021-01-05 DIAGNOSIS — K921 Melena: Secondary | ICD-10-CM | POA: Diagnosis present

## 2021-01-05 DIAGNOSIS — Z885 Allergy status to narcotic agent status: Secondary | ICD-10-CM | POA: Diagnosis not present

## 2021-01-05 DIAGNOSIS — Z881 Allergy status to other antibiotic agents status: Secondary | ICD-10-CM | POA: Diagnosis not present

## 2021-01-05 DIAGNOSIS — Z85118 Personal history of other malignant neoplasm of bronchus and lung: Secondary | ICD-10-CM | POA: Insufficient documentation

## 2021-01-05 HISTORY — PX: HEMOSTASIS CLIP PLACEMENT: SHX6857

## 2021-01-05 HISTORY — PX: COLONOSCOPY WITH PROPOFOL: SHX5780

## 2021-01-05 HISTORY — PX: HOT HEMOSTASIS: SHX5433

## 2021-01-05 SURGERY — COLONOSCOPY WITH PROPOFOL
Anesthesia: Monitor Anesthesia Care

## 2021-01-05 MED ORDER — PROPOFOL 10 MG/ML IV BOLUS
INTRAVENOUS | Status: DC | PRN
  Administered 2021-01-05 (×4): 10 mg via INTRAVENOUS
  Administered 2021-01-05: 20 mg via INTRAVENOUS
  Administered 2021-01-05: 10 mg via INTRAVENOUS
  Administered 2021-01-05: 20 mg via INTRAVENOUS

## 2021-01-05 MED ORDER — LIDOCAINE 2% (20 MG/ML) 5 ML SYRINGE
INTRAMUSCULAR | Status: DC | PRN
  Administered 2021-01-05: 70 mg via INTRAVENOUS

## 2021-01-05 MED ORDER — PROPOFOL 500 MG/50ML IV EMUL
INTRAVENOUS | Status: DC | PRN
  Administered 2021-01-05: 100 ug/kg/min via INTRAVENOUS

## 2021-01-05 MED ORDER — SODIUM CHLORIDE 0.9 % IV SOLN: INTRAVENOUS | Status: DC

## 2021-01-05 SURGICAL SUPPLY — 22 items

## 2021-01-05 NOTE — Discharge Instructions (Signed)

## 2021-01-05 NOTE — Transfer of Care (Signed)
Immediate Anesthesia Transfer of Care Note  Patient: [name]  Procedure(s) Performed: COLONOSCOPY WITH PROPOFOL (N/A ) HOT HEMOSTASIS (ARGON PLASMA COAGULATION/BICAP) (N/A ) HEMOSTASIS CLIP PLACEMENT  Patient Location: Endoscopy Unit  Anesthesia Type:MAC  Level of Consciousness: drowsy and patient cooperative  Airway & Oxygen Therapy: Patient Spontanous Breathing and Patient connected to face mask oxygen  Post-op Assessment: Report given to RN and Post -op Vital signs reviewed and stable  Post vital signs: Reviewed and stable  Last Vitals:  Vitals Value Taken Time  BP    Temp    Pulse 40 01/05/21 1353  Resp 19 01/05/21 1353  SpO2 100 % 01/05/21 1353  Vitals shown include unvalidated device data.  Last Pain:  Vitals:   01/05/21 1220  TempSrc: Temporal  PainSc: 4          Complications: No complications documented.

## 2021-01-05 NOTE — Anesthesia Postprocedure Evaluation (Signed)
Anesthesia Post Note  Patient: [name]  Procedure(s) Performed: COLONOSCOPY WITH PROPOFOL (N/A ) HOT HEMOSTASIS (ARGON PLASMA COAGULATION/BICAP) (N/A ) HEMOSTASIS CLIP PLACEMENT     Patient location during evaluation: PACU Anesthesia Type: MAC Level of consciousness: awake and alert Pain management: pain level controlled Vital Signs Assessment: post-procedure vital signs reviewed and stable Respiratory status: spontaneous breathing, nonlabored ventilation, respiratory function stable and patient connected to nasal cannula oxygen Cardiovascular status: stable and blood pressure returned to baseline Postop Assessment: no apparent nausea or vomiting Anesthetic complications: no   No complications documented.  Last Vitals:  Vitals:   01/05/21 1400 01/05/21 1410  BP: (!) 164/68 (!) 162/50  Pulse: (!) 42 (!) 46  Resp: 20 19  Temp:    SpO2: 100% 97%    Last Pain:  Vitals:   01/05/21 1400  TempSrc:   PainSc: 4                  Oliviya Gilkison S

## 2021-01-05 NOTE — Op Note (Signed)
[facility] Patient Name: [name] Procedure Date: 01/05/2021 MRN: [id number] Attending MD: Carol Ada , MD Date of Birth: 05-02-1941 CSN: [id number] Age: 80 Admit Type: Inpatient Procedure:                Colonoscopy Indications:              Heme positive stool, Melena Providers:                Carol Ada, MD, Burtis Junes, RN, Fransico Setters Mbumina,                            Technician Referring MD:              Medicines:                Propofol per Anesthesia Complications:            No immediate complications. Estimated Blood Loss:     Estimated blood loss: none. Procedure:                Pre-Anesthesia Assessment:                           - Prior to the procedure, a History and Physical                            was performed, and patient medications and                            allergies were reviewed. The patient's tolerance of                            previous anesthesia was also reviewed. The risks                            and benefits of the procedure and the sedation                            options and risks were discussed with the patient.                            All questions were answered, and informed consent                            was obtained. Prior Anticoagulants: The patient has                            taken Plavix (clopidogrel), last dose was 5 days                            prior to procedure. ASA Grade Assessment: III - A                            patient with severe systemic disease. After  reviewing the risks and benefits, the patient was                            deemed in satisfactory condition to undergo the                            procedure.                           - Sedation was administered by an anesthesia                            professional. Deep sedation was attained.                           After obtaining informed consent, the colonoscope                            was  passed under direct vision. Throughout the                            procedure, the patient's blood pressure, pulse, and                            oxygen saturations were monitored continuously. The                            CF-HQ190L ([id number] Olympus colonoscope was                            introduced through the anus and advanced to the the                            cecum, identified by appendiceal orifice and                            ileocecal valve. The colonoscopy was performed                            without difficulty. The patient tolerated the                            procedure well. The quality of the bowel                            preparation was good. The ileocecal valve,                            appendiceal orifice, and rectum were photographed. Scope In: 1:30:48 PM Scope Out: 1:45:33 PM Scope Withdrawal Time: 0 hours 10 minutes 15 seconds  Total Procedure Duration: 0 hours 14 minutes 45 seconds  Findings:      A few small localized angiodysplastic lesions without bleeding were       found in the cecum. Coagulation for tissue destruction using monopolar  probe was successful. To prevent bleeding post-intervention, one       hemostatic clip was successfully placed (MR conditional). There was no       bleeding at the end of the procedure.      In the cecum there were some small erythematous areas that were clusted       together. This was suspicous for a clustering of AVMs. They were not       bleeding, but the area was treated with APC. One hemoclip was deployed       to prevent any post treatment bleed. The prior areas of treatment were       noted, Image 5. Impression:               - A few non-bleeding colonic angiodysplastic                            lesions. Treated with a monopolar probe. Clip (MR                            conditional) was placed.                           - No specimens collected. Moderate Sedation:      Not Applicable -  Patient had care per Anesthesia. Recommendation:           - Patient has a contact number available for                            emergencies. The signs and symptoms of potential                            delayed complications were discussed with the                            patient. Return to normal activities tomorrow.                            Written discharge instructions were provided to the                            patient.                           - Resume previous diet.                           - Continue present medications.                           - Follow up in the office in one month.                           - Resume Plavix. Procedure Code(s):        --- Professional ---                           [phone], Colonoscopy, flexible; with ablation of  tumor(s), polyp(s), or other lesion(s) (includes                            pre- and post-dilation and guide wire passage, when                            performed) Diagnosis Code(s):        --- Professional ---                           K55.20, Angiodysplasia of colon without hemorrhage                           R19.5, Other fecal abnormalities                           K92.1, Melena (includes Hematochezia) CPT copyright 2019 [employer]. All rights reserved. The codes documented in this report are preliminary and upon coder review may  be revised to meet current compliance requirements. Carol Ada, MD Carol Ada, MD 01/05/2021 2:01:08 PM This report has been signed electronically. Number of Addenda: 0

## 2021-01-05 NOTE — H&P (Signed)
[name]  HPI: She reported having dark stool, but it was not melena for the past couple of days.  She also states that there is an associated bilateral lower abdominal pain.  The patient denies any issues with fever.  Today, with her renal follow up appointment, blood work was obtained  Past Medical History:  Diagnosis Date  . 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 Asthma   . 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  . Blood in stool    last week of aug 2018  . Carotid artery occlusion   . Complication of anesthesia   . COPD (chronic obstructive pulmonary disease) ([place])   . Coronary artery disease   . 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 Diverticulitis   . Dyspnea    with exertion  . Gastroesophageal reflux disease    occ  . Glaucoma    right eye  . Headache    tension  . Headache syndrome 11/27/2018  . Hiatal hernia   . Hyperlipidemia   . Hypertension   . Iliac artery aneurysm ([place])   . Lung cancer ([place]) dx 2018   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  . Myocardial infarction [facility]) 01/01/2000   Cardiac catheterization  . Peripheral vascular disease ([place])    stents in legs x 2 or 3  . Pneumonia    last time winter 2017 -2018  . PONV (postoperative nausea and vomiting)    occassionally, last colonscopy did ok with anesthesia  . Reflux   . Wears dentures    Full set  . Wears glasses     Past Surgical History:  Procedure Laterality Date  . ABDOMINAL HYSTERECTOMY    . APPENDECTOMY    . Arnold-chiari malformation repair  1998   Suboccipital craniectomy  . CAROTID ENDARTERECTOMY  03/29/2010   Left  CEA  . CHOLECYSTECTOMY     Gall Bladder  . COLONOSCOPY WITH PROPOFOL N/A 04/22/2015   Procedure: COLONOSCOPY WITH PROPOFOL;  Surgeon: Carol Ada, MD;  Location: [facility];  Service: Endoscopy;  Laterality: N/A;  . COLONOSCOPY WITH PROPOFOL N/A 05/25/2016    Procedure: COLONOSCOPY WITH PROPOFOL;  Surgeon: Carol Ada, MD;  Location: [facility];  Service: Endoscopy;  Laterality: N/A;  . COLONOSCOPY WITH PROPOFOL N/A 05/03/2017   Procedure: COLONOSCOPY WITH PROPOFOL;  Surgeon: Carol Ada, MD;  Location: [facility];  Service: Endoscopy;  Laterality: N/A;  . COLONOSCOPY WITH PROPOFOL N/A 06/29/2020   Procedure: COLONOSCOPY WITH PROPOFOL;  Surgeon: Carol Ada, MD;  Location: [facility];  Service: Endoscopy;  Laterality: N/A;  . CORNEAL TRANSPLANT     Right  . CORONARY ARTERY BYPASS GRAFT  01/01/2000   x 3  . ENTEROSCOPY N/A 02/27/2018   Procedure: ENTEROSCOPY;  Surgeon: Carol Ada, MD;  Location: [facility];  Service: Endoscopy;  Laterality: N/A;  . ENTEROSCOPY N/A 07/18/2018   Procedure: ENTEROSCOPY;  Surgeon: Carol Ada, MD;  Location: [facility];  Service: Endoscopy;  Laterality: N/A;  . ENTEROSCOPY N/A 06/29/2020   Procedure: ENTEROSCOPY;  Surgeon: Carol Ada, MD;  Location: [facility];  Service: Endoscopy;  Laterality: N/A;  . ESOPHAGOGASTRODUODENOSCOPY N/A 05/25/2016   Procedure: ESOPHAGOGASTRODUODENOSCOPY (EGD);  Surgeon: Carol Ada, MD;  Location: [facility];  Service: Endoscopy;  Laterality: N/A;  . ESOPHAGOGASTRODUODENOSCOPY N/A 08/01/2018   Procedure: ESOPHAGOGASTRODUODENOSCOPY (EGD);  Surgeon: Carol Ada, MD;  Location: [facility];  Service: Endoscopy;  Laterality: N/A;  .  EYE SURGERY Right 1995 or 1996   Laser surgery for retinal hemorrhage  . GIVENS CAPSULE STUDY N/A 07/16/2018   Procedure: GIVENS CAPSULE STUDY;  Surgeon: Carol Ada, MD;  Location: [facility];  Service: Endoscopy;  Laterality: N/A;  . HEMOSTASIS CLIP PLACEMENT  06/29/2020   Procedure: HEMOSTASIS CLIP PLACEMENT;  Surgeon: Carol Ada, MD;  Location: [facility];  Service: Endoscopy;;  . HOT HEMOSTASIS N/A 02/27/2018   Procedure: HOT HEMOSTASIS (ARGON PLASMA COAGULATION/BICAP);  Surgeon: Carol Ada, MD;  Location: [facility];   Service: Endoscopy;  Laterality: N/A;  . HOT HEMOSTASIS N/A 08/01/2018   Procedure: HOT HEMOSTASIS (ARGON PLASMA COAGULATION/BICAP);  Surgeon: Carol Ada, MD;  Location: [facility];  Service: Endoscopy;  Laterality: N/A;  . HOT HEMOSTASIS N/A 06/29/2020   Procedure: HOT HEMOSTASIS (ARGON PLASMA COAGULATION/BICAP);  Surgeon: Carol Ada, MD;  Location: [facility];  Service: Endoscopy;  Laterality: N/A;  . IR RADIOLOGIST EVAL & MGMT  12/14/2016  . LEFT HEART CATH AND CORS/GRAFTS ANGIOGRAPHY N/A 01/09/2018   Procedure: LEFT HEART CATH AND CORS/GRAFTS ANGIOGRAPHY;  Surgeon: Charolette Forward, MD;  Location: [facility];  Service: Cardiovascular;  Laterality: N/A;  . LEFT HEART CATHETERIZATION WITH CORONARY ANGIOGRAM N/A 08/03/2014   Procedure: LEFT HEART CATHETERIZATION WITH CORONARY ANGIOGRAM;  Surgeon: Birdie Riddle, MD;  Location: [facility];  Service: Cardiovascular;  Laterality: N/A;  . Post Coronary Artery  BPG  01/05/2000   Right jugular sheath removed  . PR VEIN BYPASS GRAFT,AORTO-FEM-POP    . ROTATOR CUFF REPAIR     Right    Family History  Problem Relation Age of Onset  . Heart disease Mother        Heart Disease before age 62  . Hypertension Mother   . Hyperlipidemia Mother   . Heart attack Mother   . Clotting disorder Mother   . Heart disease Father        Heart Disease before age 27  . Heart attack Father   . Hyperlipidemia Father   . Hypertension Father   . Heart disease Brother        Heart Disease before age 29  . Hyperlipidemia Brother   . Hypertension Brother   . Clotting disorder Brother   . AAA (abdominal aortic aneurysm) Brother   . Cerebral aneurysm Sister   . Hypertension Sister   . AAA (abdominal aortic aneurysm) Sister   . Asthma Sister   . Cerebral aneurysm Brother   . Cancer Brother        Lung  . Hypertension Brother   . Heart attack Brother   . Heart disease Brother        Aneurysm of Brain  . Hypertension Brother   . Heart disease  Brother   . Heart disease Brother   . Stroke Son        Aneurysm of Stomach  . AAA (abdominal aortic aneurysm) Son   . Cancer Maternal Uncle        great uncle/cancer/type unknown    Social History:  reports that she quit smoking about 20 years ago. Her smoking use included cigarettes. She has a 45.00 pack-year smoking history. She has never used smokeless tobacco. She reports that she does not drink alcohol and does not use drugs.  Allergies:  Allergies  Allergen Reactions  . Azithromycin Swelling    Patient reported past history of lip swelling  . Codeine Other (See Comments)    Dr. Terrence Dupont advised patient not to take this medication  .  Doxycycline Swelling    Mouth, lips, feet swelling  . Hydromorphone Palpitations and Other (See Comments)    DILAUDID  -  Pt had a Heart Attack after taking Dilaudid.  . Levaquin [Levofloxacin] Other (See Comments)    Chest pressure, SOB, "pain in between shoulder blades", sweaty -as reported by patient per experience in ED this afternoon  . Vitamin D Analogs Swelling  . Avelox [Moxifloxacin Hcl In Nacl] Palpitations    Caused Heart Attack   . Oxycodone-Acetaminophen Other (See Comments)    Says it makes her feel weird  . Risedronate Other (See Comments)    Chest pain    Medications:  Scheduled:  Continuous: . sodium chloride      No results found for this or any previous visit (from the past 24 hour(s)).   No results found.  ROS:  As stated above in the HPI otherwise negative.  Height 5\' 2"  (1.575 m), weight 67.3 kg.    PE: Gen: NAD, Alert and Oriented HEENT:  [place]/AT, EOMI Neck: Supple, no LAD Lungs: CTA Bilaterally CV: RRR without M/G/R ABD: Soft, NTND, +BS Ext: No C/C/E  Assessment/Plan: 1) Heme positive stool. 2) Melena. 3) History of AVMs.  Plan: 1) Colonoscopy.  Ariya Bohannon D 01/05/2021, 12:10 PM

## 2021-01-05 NOTE — Anesthesia Preprocedure Evaluation (Signed)
Anesthesia Evaluation  Patient identified by MRN, date of birth, ID band Patient awake    Reviewed: Allergy & Precautions, NPO status , Patient's Chart, lab work & pertinent test results  Airway Mallampati: II  TM Distance: >3 FB Neck ROM: Full    Dental  (+) Upper Dentures, Lower Dentures   Pulmonary COPD,  COPD inhaler, former smoker,    Pulmonary exam normal breath sounds clear to auscultation       Cardiovascular hypertension, + CAD, + Past MI and + Peripheral Vascular Disease  Normal cardiovascular exam Rhythm:Regular Rate:Normal     Neuro/Psych TIAnegative psych ROS   GI/Hepatic negative GI ROS, Neg liver ROS,   Endo/Other  negative endocrine ROS  Renal/GU negative Renal ROS  negative genitourinary   Musculoskeletal negative musculoskeletal ROS (+)   Abdominal   Peds negative pediatric ROS (+)  Hematology negative hematology ROS (+)   Anesthesia Other Findings   Reproductive/Obstetrics negative OB ROS                             Anesthesia Physical Anesthesia Plan  ASA: III  Anesthesia Plan: MAC   Post-op Pain Management:    Induction: Intravenous  PONV Risk Score and Plan: 2 and Propofol infusion and Treatment may vary due to age or medical condition  Airway Management Planned: Simple Face Mask  Additional Equipment:   Intra-op Plan:   Post-operative Plan:   Informed Consent: I have reviewed the patients History and Physical, chart, labs and discussed the procedure including the risks, benefits and alternatives for the proposed anesthesia with the patient or authorized representative who has indicated his/her understanding and acceptance.     Dental advisory given  Plan Discussed with: CRNA and Surgeon  Anesthesia Plan Comments:         Anesthesia Quick Evaluation

## 2021-01-08 ENCOUNTER — Encounter (HOSPITAL_COMMUNITY): Payer: Self-pay | Admitting: Gastroenterology

## 2021-02-21 ENCOUNTER — Telehealth: Payer: Self-pay | Admitting: Internal Medicine

## 2021-02-21 NOTE — Telephone Encounter (Signed)
R/s 7/19 appt due to provider on call scheduled. Called and spoke with patient. Confirmed new date and time

## 2021-02-24 NOTE — Progress Notes (Signed)
[facility] OFFICE PROGRESS NOTE  [name], MD [address] [place] [address]  DIAGNOSIS:  1) stage IA non-small cell lung cancer, squamous cell carcinoma presented with right lower lobe pulmonary nodule. 2) persistent anemia questionable for anemia of chronic disease plus/minus iron deficiency.  PRIOR THERAPY:  1) Feraheme infusion last dose was given 06/20/2016. 2) stereotactic radiotherapy to the right lower lobe lung nodule under the care of Dr. Tammi Klippel  CURRENT THERAPY: Observation   INTERVAL HISTORY: [name] 80 y.o. female returns to the clinic today for a follow-up visit.  The patient is feeling fairly well today without any concerning complaints except for baseline dyspnea on exertion. She sees Dr. Valeta Harms from pulmonology. The patient was diagnosed with stage I non-small cell lung cancer and November 2017.  She is status post SBRT to the right lower lobe lung nodule and has been on observation since that time and feeling well without any new concerning complaints. She has some ongoing GI issues for which she sees Dr. Benson Norway. He performed a colonoscopy in May 2022 for heme positive stool and history of AVMs. Her Hbg is improved to 11 compared to 8's a few months ago. She has intermittent RUQ pain. She had a prior cholecystectomy. She denies any recent fever, chills, night sweats, or unexplained weight loss.  She notes she is always cold. She reports her baseline dyspnea on exertion.  She quit smoking in 2001. She reports a cough at baseline which produces mucus. She denies any chest pain or hemoptysis. She denies any headache or visual changes except for her baseline visual changes associated with macular degeneration. She sees an opthalmologist every 4 months. The patient recently had a restaging CT scan performed.  She is here today for evaluation and to review her scan results.  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   Asthma    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   Blood in stool    last week of aug 2018   Carotid artery occlusion    Complication of anesthesia    COPD (chronic obstructive pulmonary disease) (HCC)    Coronary artery disease    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   Diverticulitis    Dyspnea    with exertion   Gastroesophageal reflux disease    occ   Glaucoma    right eye   Headache    tension   Headache syndrome 11/27/2018   Hiatal hernia    Hyperlipidemia    Hypertension    Iliac artery aneurysm (HCC)    Lung cancer ([facility]) dx 2018   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   Myocardial infarction ([facility]) 01/01/2000   Cardiac catheterization   Peripheral vascular disease ([facility])    stents in legs x 2 or 3   Pneumonia    last time winter 2017 -2018   PONV (postoperative nausea and vomiting)    occassionally, last colonscopy did ok with anesthesia   Reflux    Wears dentures    Full set   Wears glasses     ALLERGIES:  is allergic to azithromycin, codeine, doxycycline, hydromorphone, levaquin [levofloxacin], vitamin d analogs, avelox [moxifloxacin hcl in nacl], oxycodone-acetaminophen, and risedronate.  MEDICATIONS:  Current Outpatient Medications  Medication Sig Dispense Refill   acetaminophen (TYLENOL) 500 MG tablet Take 500-1,000 mg by  mouth every 6 (six) hours as needed (pain).     albuterol (VENTOLIN HFA) 108 (90 Base) MCG/ACT inhaler Inhale 2 puffs into the lungs every 6 (six) hours as needed for wheezing or shortness of breath. 8 g 6   cetirizine (ZYRTEC) 10 MG tablet Take 10 mg by mouth daily as needed for allergies.      clopidogrel (PLAVIX) 75 MG tablet Take 1 tablet (75 mg total) by mouth daily. (Patient taking differently: Take 75 mg by mouth in the morning.) 90 tablet 3   dexlansoprazole (DEXILANT) 60 MG capsule Take 1 capsule (60  mg total) by mouth daily before breakfast. 30 capsule 11   Fluticasone-Umeclidin-Vilant (TRELEGY ELLIPTA) 200-62.5-25 MCG/INH AEPB Inhale 1 puff into the lungs daily. 60 each 6   furosemide (LASIX) 40 MG tablet Take 40 mg by mouth daily.     magnesium oxide (MAG-OX) 400 (241.3 Mg) MG tablet Take 400 mg by mouth in the morning.     NIFEdipine (PROCARDIA-XL/NIFEDICAL-XL) 30 MG 24 hr tablet Take 30 mg by mouth in the morning.     nitroGLYCERIN (NITROSTAT) 0.4 MG SL tablet Place 0.4 mg under the tongue every 5 (five) minutes x 3 doses as needed for chest pain.     olmesartan (BENICAR) 20 MG tablet Take 1 tablet (20 mg total) by mouth daily. (Patient taking differently: Take 20 mg by mouth in the morning.) 90 tablet 1   polyethylene glycol powder (MIRALAX) 17 GM/SCOOP powder Take 17 g by mouth 2 (two) times daily as needed for moderate constipation. 255 g 1   rosuvastatin (CRESTOR) 10 MG tablet Take 10 mg by mouth at bedtime.      valACYclovir (VALTREX) 1000 MG tablet Take 1,000 mg by mouth 2 (two) times daily. (Patient not taking: Reported on 03/01/2021)     No current facility-administered medications for this visit.   Facility-Administered Medications Ordered in Other Visits  Medication Dose Route Frequency Provider Last Rate Last Admin   promethazine (PHENERGAN) injection 6.25-12.5 mg  6.25-12.5 mg Intravenous Q15 min PRN Brennan Bailey, MD        SURGICAL HISTORY:  Past Surgical History:  Procedure Laterality Date   ABDOMINAL HYSTERECTOMY     APPENDECTOMY     Arnold-chiari malformation repair  1998   Suboccipital craniectomy   CAROTID ENDARTERECTOMY  03/29/2010   Left  CEA   CHOLECYSTECTOMY     Gall Bladder   COLONOSCOPY WITH PROPOFOL N/A 04/22/2015   Procedure: COLONOSCOPY WITH PROPOFOL;  Surgeon: Carol Ada, MD;  Location: [facility];  Service: Endoscopy;  Laterality: N/A;   COLONOSCOPY WITH PROPOFOL N/A 05/25/2016   Procedure: COLONOSCOPY WITH PROPOFOL;  Surgeon: Carol Ada,  MD;  Location: [facility];  Service: Endoscopy;  Laterality: N/A;   COLONOSCOPY WITH PROPOFOL N/A 05/03/2017   Procedure: COLONOSCOPY WITH PROPOFOL;  Surgeon: Carol Ada, MD;  Location: [facility];  Service: Endoscopy;  Laterality: N/A;   COLONOSCOPY WITH PROPOFOL N/A 06/29/2020   Procedure: COLONOSCOPY WITH PROPOFOL;  Surgeon: Carol Ada, MD;  Location: [facility];  Service: Endoscopy;  Laterality: N/A;   COLONOSCOPY WITH PROPOFOL N/A 01/05/2021   Procedure: COLONOSCOPY WITH PROPOFOL;  Surgeon: Carol Ada, MD;  Location: [facility];  Service: Endoscopy;  Laterality: N/A;   CORNEAL TRANSPLANT     Right   CORONARY ARTERY BYPASS GRAFT  01/01/2000   x 3   ENTEROSCOPY N/A 02/27/2018   Procedure: ENTEROSCOPY;  Surgeon: Carol Ada, MD;  [facility]: [facility];  Service: Endoscopy;  Laterality: N/A;  ENTEROSCOPY N/A 07/18/2018   Procedure: ENTEROSCOPY;  Surgeon: Carol Ada, MD;  [facility];  Service: Endoscopy;  Laterality: N/A;   ENTEROSCOPY N/A 06/29/2020   Procedure: ENTEROSCOPY;  Surgeon: Carol Ada, MD;  [facility]: [facility];  Service: Endoscopy;  Laterality: N/A;   ESOPHAGOGASTRODUODENOSCOPY N/A 05/25/2016   Procedure: ESOPHAGOGASTRODUODENOSCOPY (EGD);  Surgeon: Carol Ada, MD;  Location: [facility];  Service: Endoscopy;  Laterality: N/A;   ESOPHAGOGASTRODUODENOSCOPY N/A 08/01/2018   Procedure: ESOPHAGOGASTRODUODENOSCOPY (EGD);  Surgeon: Carol Ada, MD;  Location: [facility];  Service: Endoscopy;  Laterality: N/A;   EYE SURGERY Right 1995 or 1996   Laser surgery for retinal hemorrhage   GIVENS CAPSULE STUDY N/A 07/16/2018   Procedure: GIVENS CAPSULE STUDY;  Surgeon: Carol Ada, MD;  [facility]: [facility];  Service: Endoscopy;  Laterality: N/A;   HEMOSTASIS CLIP PLACEMENT  06/29/2020   Procedure: HEMOSTASIS CLIP PLACEMENT;  Surgeon: Carol Ada, MD;  Location: [facility];  Service: Endoscopy;;   HEMOSTASIS CLIP PLACEMENT  01/05/2021    Procedure: HEMOSTASIS CLIP PLACEMENT;  Surgeon: Carol Ada, MD;  Location: [facility];  Service: Endoscopy;;   HOT HEMOSTASIS N/A 02/27/2018   Procedure: HOT HEMOSTASIS (ARGON PLASMA COAGULATION/BICAP);  Surgeon: Carol Ada, MD;  Location: [facility];  Service: Endoscopy;  Laterality: N/A;   HOT HEMOSTASIS N/A 08/01/2018   Procedure: HOT HEMOSTASIS (ARGON PLASMA COAGULATION/BICAP);  Surgeon: Carol Ada, MD;  Location: [facility];  Service: Endoscopy;  Laterality: N/A;   HOT HEMOSTASIS N/A 06/29/2020   Procedure: HOT HEMOSTASIS (ARGON PLASMA COAGULATION/BICAP);  Surgeon: Carol Ada, MD;  Location: [facility];  Service: Endoscopy;  Laterality: N/A;   HOT HEMOSTASIS N/A 01/05/2021   Procedure: HOT HEMOSTASIS (ARGON PLASMA COAGULATION/BICAP);  Surgeon: Carol Ada, MD;  Location: [facility];  Service: Endoscopy;  Laterality: N/A;   IR RADIOLOGIST EVAL & MGMT  12/14/2016   LEFT HEART CATH AND CORS/GRAFTS ANGIOGRAPHY N/A 01/09/2018   Procedure: LEFT HEART CATH AND CORS/GRAFTS ANGIOGRAPHY;  Surgeon: Charolette Forward, MD;  Location: [facility];  Service: Cardiovascular;  Laterality: N/A;   LEFT HEART CATHETERIZATION WITH CORONARY ANGIOGRAM N/A 08/03/2014   Procedure: LEFT HEART CATHETERIZATION WITH CORONARY ANGIOGRAM;  Surgeon: Birdie Riddle, MD;  Location: [facility];  Service: Cardiovascular;  Laterality: N/A;   Post Coronary Artery  BPG  01/05/2000   Right jugular sheath removed   PR VEIN BYPASS GRAFT,AORTO-FEM-POP     ROTATOR CUFF REPAIR     Right    REVIEW OF SYSTEMS:   Review of Systems  Constitutional: Positive for fatigue and decreased appetite. Positive for frequently cold. Negative for chills and fever and unexpected weight change.  HENT: Negative for mouth sores, nosebleeds, sore throat and trouble swallowing.   Eyes: Negative for eye problems and icterus.  Respiratory: Positive for dyspnea on exertion, wheezing, and cough. Negative for  hemoptysis. Cardiovascular: Negative for chest pain and leg swelling.  Gastrointestinal: Positive for intermittent RUQ pain. Negative for constipation, diarrhea, nausea and vomiting.  Genitourinary: Negative for bladder incontinence, difficulty urinating, dysuria, frequency and hematuria.   Musculoskeletal: Negative for back pain, gait problem, neck pain and neck stiffness.  Skin: Negative for itching and rash.  Neurological: Negative for dizziness, extremity weakness, gait problem, headaches, light-headedness and seizures.  Hematological: Negative for adenopathy. Does not bruise/bleed easily.  Psychiatric/Behavioral: Negative for confusion, depression and sleep disturbance. The patient is not nervous/anxious.     PHYSICAL EXAMINATION:  Blood pressure (!) 173/62, pulse 66, temperature 98.7 F (37.1 C), temperature source Tympanic, resp.  rate 18, weight 147 lb (66.7 kg), SpO2 97 %.  ECOG PERFORMANCE STATUS: 1  Physical Exam  Constitutional: Oriented to person, place, and time and well-developed, well-nourished, and in no distress.  HENT:  Head: Normocephalic and atraumatic.  Mouth/Throat: Oropharynx is clear and moist. No oropharyngeal exudate.  Eyes: Conjunctivae are normal. Right eye exhibits no discharge. Left eye exhibits no discharge. No scleral icterus.  Neck: Normal range of motion. Neck supple.  Cardiovascular: Normal rate, regular rhythm, normal heart sounds and intact distal pulses.   Pulmonary/Chest: Effort normal. Quiet breath sounds bilaterally. No respiratory distress. No wheezes. No rales.  Abdominal: Soft. Bowel sounds are normal. Exhibits no distension and no mass. There is no tenderness.  Musculoskeletal: Normal range of motion. Exhibits no edema.  Lymphadenopathy:    No cervical adenopathy.  Neurological: Alert and oriented to person, place, and time. Exhibits normal muscle tone. Gait normal. Coordination normal.  Skin: Skin is warm and dry. No rash noted. Not  diaphoretic. No erythema. No pallor.  Psychiatric: Mood, memory and judgment normal.  Vitals reviewed.  LABORATORY DATA: Lab Results  Component Value Date   WBC 6.3 02/27/2021   HGB 11.3 (L) 02/27/2021   HCT 34.6 (L) 02/27/2021   MCV 91.1 02/27/2021   PLT 254 02/27/2021      Chemistry      Component Value Date/Time   NA 142 02/27/2021 0918   NA 139 08/02/2017 0821   K 4.5 02/27/2021 0918   K 4.3 08/02/2017 0821   CL 104 02/27/2021 0918   CO2 28 02/27/2021 0918   CO2 24 08/02/2017 0821   BUN 28 (H) 02/27/2021 0918   BUN 14.4 08/02/2017 0821   CREATININE 1.63 (H) 02/27/2021 0918   CREATININE 1.3 (H) 08/02/2017 0821      Component Value Date/Time   CALCIUM 9.8 02/27/2021 0918   CALCIUM 9.2 08/02/2017 0821   ALKPHOS 71 02/27/2021 0918   ALKPHOS 66 08/02/2017 0821   AST 15 02/27/2021 0918   AST 14 08/02/2017 0821   ALT 6 02/27/2021 0918   ALT 8 08/02/2017 0821   BILITOT 0.4 02/27/2021 0918   BILITOT 0.34 08/02/2017 0821       RADIOGRAPHIC STUDIES:  CT Chest Wo Contrast  Result Date: 02/28/2021 CLINICAL DATA:  Stage IA right lower lobe non-small cell lung cancer status post radiation therapy. Chronic dyspnea with COPD. Restaging. EXAM: CT CHEST WITHOUT CONTRAST TECHNIQUE: Multidetector CT imaging of the chest was performed following the standard protocol without IV contrast. COMPARISON:  07/02/2020 chest CT. FINDINGS: Cardiovascular: Normal heart size. No significant pericardial effusion/thickening. Left anterior descending and right coronary atherosclerosis status post CABG. Atherosclerotic nonaneurysmal thoracic aorta. Normal caliber pulmonary arteries. Mediastinum/Nodes: No discrete thyroid nodules. Unremarkable esophagus. No pathologically enlarged axillary, mediastinal or hilar lymph nodes, noting limited sensitivity for the detection of hilar adenopathy on this noncontrast study. Lungs/Pleura: No pneumothorax. No pleural effusion. Irregular 1.4 cm subsolid anterior  basilar right upper lobe pulmonary nodule with heterogeneous internal calcification (series 7/image 69), not substantially changed in the interval, although mildly increased from 1.2 cm on 08/22/2017 chest CT and 1.0 cm on 02/28/2016 chest CT. Masslike fibrosis in the right lower lobe measures 2.9 x 2.3 cm (series 7/image 81), slightly decreased from 3.0 x 2.6 cm on 02/26/2020 chest CT, compatible with postradiation change. No acute consolidative airspace disease or new significant pulmonary nodules. Upper abdomen: Cholecystectomy. Musculoskeletal: No aggressive appearing focal osseous lesions. Stable discontinuity in the lower most sternotomy wire. Otherwise intact sternotomy  wires. Mild thoracic spondylosis. IMPRESSION: 1. Stable masslike fibrosis in the right lower lobe, with no evidence of local tumor recurrence. 2. No evidence of metastatic disease in the chest. 3. Irregular 1.4 cm subsolid anterior basilar right upper lobe pulmonary nodule with heterogeneous internal calcification, growing slowly on multiple chest CT studies back to 2017. Slow growing indolent metachronous primary bronchogenic adenocarcinoma cannot be excluded. 4. Aortic Atherosclerosis (ICD10-I70.0). Electronically Signed   By: Ilona Sorrel M.D.   On: 02/28/2021 09:02     ASSESSMENT/PLAN:  This is a very pleasant 80 year old Caucasian female diagnosed with stage Ia non-small cell lung cancer, squamous cell carcinoma.  She initially presented with a right lower lobe pulmonary nodule in 2017.   She is status post SBRT to this lesion under the care of Dr. Tammi Klippel which was completed in 2017.  The patient has been on observation since that time and she is feeling well.  The patient recently had a restaging CT scan performed.  Dr. Julien Nordmann personally and independently reviewed the scan and discussed the results with the patient today.  The scan showed no evidence of disease progression except for an irregular 1.4 cm Solid anterior base  healer right upper lobe pulmonary nodule with heterogeneous internal calcification, this has been growing slowly over the course of multiple CT scans dating back to 2017.  Cannot exclude slow-growing indolent metachronous primary bronchogenic carcinoma.  Dr. Julien Nordmann recommends that she continue on observation with a repeat CT scan in 6 months.  If this area continues to grow, Dr. Julien Nordmann discussed the options would include biopsy versus SBRT.  I will arrange for restaging CT scan of the chest in 6 months.  I will order this without contrast due to her CKD.  The patient will continue to follow with gastroenterology regarding her iron deficiency anemia and AVMs in abdominal discomfort.   The patient's blood pressure is elevated today.  The patient states that this was taken shortly after exertion.  Advised the patient to monitor her blood pressure closely at home and if it continues to be elevated to follow-up with her cardiologist.  The patient was advised to call immediately if she has any concerning symptoms in the interval. The patient voices understanding of current disease status and treatment options and is in agreement with the current care plan. All questions were answered. The patient knows to call the clinic with any problems, questions or concerns. We can certainly see the patient much sooner if necessary      Orders Placed This Encounter  Procedures   CT Chest Wo Contrast    Standing Status:   Future    Standing Expiration Date:   03/01/2022    Order Specific Question:   Preferred imaging location?    Answer:   [facility] ([facility] only)    Standing Status:   Future    Standing Expiration Date:   03/01/2022   CBC with Differential (Cancer Center Only)    Standing Status:   Future    Standing Expiration Date:   03/01/2022       Tobe Sos Majesta Leichter, PA-C 03/01/21  ADDENDUM: Hematology/Oncology Attending: I had a face-to-face encounter with the  patient today.  I reviewed her records, lab and scan and recommended her care plan.  This is a very pleasant 80 years old white female with history of stage Ia non-small cell lung cancer, squamous cell carcinoma diagnosed in 2017 status post SBRT under the care of Dr. Tammi Klippel.  She has been on observation since that time and she is feeling fine.  The patient also has a history of anemia of chronic disease plus minus iron deficiency status post iron infusion.  The patient had repeat CT scan of the chest performed recently.  I personally and independently reviewed the scan and discussed the results with the patient and her husband. Her scan showed no concerning findings for disease recurrence or metastasis. I recommended for her to continue on observation with repeat CT scan of the chest in 1 year. For the history of anemia she will continue on over-the-counter iron supplements on as-needed basis. The patient was advised to call immediately if she has any concerning symptoms in the interval. Disclaimer: This note was dictated with voice recognition software. Similar sounding words can inadvertently be transcribed and may be missed upon review. Eilleen Kempf, MD 03/01/21

## 2021-02-27 ENCOUNTER — Ambulatory Visit (HOSPITAL_COMMUNITY)
Admission: RE | Admit: 2021-02-27 | Discharge: 2021-02-27 | Disposition: A | Discharge status: Home / Self Care | Payer: Medicare Other | Source: Ambulatory Visit | Attending: Internal Medicine | Admitting: Internal Medicine

## 2021-02-27 ENCOUNTER — Inpatient Hospital Stay: Payer: Medicare Other | Attending: Physician Assistant

## 2021-02-27 ENCOUNTER — Other Ambulatory Visit: Payer: Self-pay

## 2021-02-27 DIAGNOSIS — C3490 Malignant neoplasm of unspecified part of unspecified bronchus or lung: Secondary | ICD-10-CM | POA: Insufficient documentation

## 2021-02-27 DIAGNOSIS — R911 Solitary pulmonary nodule: Secondary | ICD-10-CM | POA: Diagnosis not present

## 2021-02-27 DIAGNOSIS — C3431 Malignant neoplasm of lower lobe, right bronchus or lung: Secondary | ICD-10-CM | POA: Diagnosis present

## 2021-02-27 DIAGNOSIS — N189 Chronic kidney disease, unspecified: Secondary | ICD-10-CM | POA: Diagnosis not present

## 2021-02-27 LAB — CBC WITH DIFFERENTIAL (CANCER CENTER ONLY)
Abs Immature Granulocytes: 0.01 10*3/uL (ref 0.00–0.07)
Basophils Absolute: 0 10*3/uL (ref 0.0–0.1)
Basophils Relative: 1 %
Eosinophils Absolute: 0.3 10*3/uL (ref 0.0–0.5)
Eosinophils Relative: 5 %
HCT: 34.6 % — ABNORMAL LOW (ref 36.0–46.0)
Hemoglobin: 11.3 g/dL — ABNORMAL LOW (ref 12.0–15.0)
Immature Granulocytes: 0 %
Lymphocytes Relative: 32 %
Lymphs Abs: 2 10*3/uL (ref 0.7–4.0)
MCH: 29.7 pg (ref 26.0–34.0)
MCHC: 32.7 g/dL (ref 30.0–36.0)
MCV: 91.1 fL (ref 80.0–100.0)
Monocytes Absolute: 0.6 10*3/uL (ref 0.1–1.0)
Monocytes Relative: 9 %
Neutro Abs: 3.3 10*3/uL (ref 1.7–7.7)
Neutrophils Relative %: 53 %
Platelet Count: 254 10*3/uL (ref 150–400)
RBC: 3.8 MIL/uL — ABNORMAL LOW (ref 3.87–5.11)
RDW: 13.1 % (ref 11.5–15.5)
WBC Count: 6.3 10*3/uL (ref 4.0–10.5)
nRBC: 0 % (ref 0.0–0.2)

## 2021-02-27 LAB — CMP (CANCER CENTER ONLY)
ALT: 6 U/L (ref 0–44)
AST: 15 U/L (ref 15–41)
Albumin: 3.7 g/dL (ref 3.5–5.0)
Alkaline Phosphatase: 71 U/L (ref 38–126)
Anion gap: 10 (ref 5–15)
BUN: 28 mg/dL — ABNORMAL HIGH (ref 8–23)
CO2: 28 mmol/L (ref 22–32)
Calcium: 9.8 mg/dL (ref 8.9–10.3)
Chloride: 104 mmol/L (ref 98–111)
Creatinine: 1.63 mg/dL — ABNORMAL HIGH (ref 0.44–1.00)
GFR, Estimated: 32 mL/min — ABNORMAL LOW (ref >60)
Glucose, Bld: 106 mg/dL — ABNORMAL HIGH (ref 70–99)
Potassium: 4.5 mmol/L (ref 3.5–5.1)
Sodium: 142 mmol/L (ref 135–145)
Total Bilirubin: 0.4 mg/dL (ref 0.3–1.2)
Total Protein: 7.7 g/dL (ref 6.5–8.1)

## 2021-02-28 ENCOUNTER — Ambulatory Visit: Payer: Medicare Other | Admitting: Internal Medicine

## 2021-03-01 ENCOUNTER — Inpatient Hospital Stay (HOSPITAL_BASED_OUTPATIENT_CLINIC_OR_DEPARTMENT_OTHER): Payer: Medicare Other | Admitting: Physician Assistant

## 2021-03-01 ENCOUNTER — Encounter: Payer: Self-pay | Admitting: Physician Assistant

## 2021-03-01 ENCOUNTER — Other Ambulatory Visit: Payer: Self-pay

## 2021-03-01 VITALS — BP 173/62 | HR 66 | Temp 98.7°F | Resp 18 | Wt 147.0 lb

## 2021-03-01 DIAGNOSIS — I10 Essential (primary) hypertension: Secondary | ICD-10-CM | POA: Diagnosis not present

## 2021-03-01 DIAGNOSIS — C3431 Malignant neoplasm of lower lobe, right bronchus or lung: Secondary | ICD-10-CM | POA: Diagnosis not present

## 2021-03-01 DIAGNOSIS — D509 Iron deficiency anemia, unspecified: Secondary | ICD-10-CM | POA: Diagnosis not present

## 2021-05-01 ENCOUNTER — Telehealth: Payer: Self-pay | Admitting: Medical Oncology

## 2021-05-01 NOTE — Telephone Encounter (Signed)
"  I am out of breath where sometimes I feel like I am going to pass out" . She saw her PCP and cardiologist who told her "there is nothing they can do for me and it may be related to her cancer and to f/u with [facility]".   Last visit was with scan in July -next scan 1 year.  Please advise.

## 2021-05-01 NOTE — Telephone Encounter (Signed)
Her last scan would not explain her symptoms.  She may need to see a pulmonologist.  She will need referral from her primary care physician to do that.  Thank you.   Pt notified of Dr. Worthy Flank message. Pt has appt Oct 10 th with Dr Valeta Harms.

## 2021-05-04 ENCOUNTER — Telehealth: Payer: Self-pay

## 2021-05-04 NOTE — Telephone Encounter (Signed)
Patient calls today to report a bump on the back of her right knee. It had been present, but today was the first time she really noticed it when the site started to itch. Says it is red and the skin is a little thinner there. Says it is about the size of the tip of her little finger - previously it was smaller. Says the bump is soft and moves around when she touches it. It doesn't hurt, states  - "it doesn't even really itch -it's just aggravating." She is able to walk okay - says she has some pain in her lower extremities but that is always present. Advised patient it was probably not a vascular issue. Discussed with PA who concurs - advised patient to follow up with her primary doctor and call us back if needed. She verbalized understanding.

## 2021-05-22 ENCOUNTER — Encounter: Payer: Self-pay | Admitting: Pulmonary Disease

## 2021-05-22 ENCOUNTER — Other Ambulatory Visit: Payer: Self-pay

## 2021-05-22 ENCOUNTER — Ambulatory Visit (INDEPENDENT_AMBULATORY_CARE_PROVIDER_SITE_OTHER): Payer: Medicare Other | Admitting: Pulmonary Disease

## 2021-05-22 VITALS — BP 130/60 | HR 62 | Temp 97.8°F | Ht 62.0 in | Wt 150.6 lb

## 2021-05-22 DIAGNOSIS — J449 Chronic obstructive pulmonary disease, unspecified: Secondary | ICD-10-CM

## 2021-05-22 DIAGNOSIS — Z923 Personal history of irradiation: Secondary | ICD-10-CM

## 2021-05-22 DIAGNOSIS — R911 Solitary pulmonary nodule: Secondary | ICD-10-CM

## 2021-05-22 DIAGNOSIS — Z85118 Personal history of other malignant neoplasm of bronchus and lung: Secondary | ICD-10-CM

## 2021-05-22 DIAGNOSIS — C3431 Malignant neoplasm of lower lobe, right bronchus or lung: Secondary | ICD-10-CM

## 2021-05-22 DIAGNOSIS — Z951 Presence of aortocoronary bypass graft: Secondary | ICD-10-CM

## 2021-05-22 NOTE — Progress Notes (Signed)
Synopsis: Referred in January 2020 to establish care, former patient of Dr. 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  She is followed with Dr. Earlie Server and Dr. Tammi Klippel.  In the oncology services.  She has an appointment with him tomorrow.  She had a recent CT scan completed yesterday which reveals right lower lobe groundglass opacities and scarring after stereotactic radiation to the right lower lobe.  She also has a small 7 mm central nodular location that will need to be followed.  She had PFTs completed today prior to her office visit.  The pulmonary function test revealed moderate COPD with an FEV1 65% postbronchodilator reduced ratio of 66, significant bronchodilator response with greater than 12% improvement, as well as a reduced DLCO 55% predicted.  Patient states today that she quit using her Anora inhaler several months ago because she did not know if it was making much difference in her breathing symptoms.  Before that she liked being on it.  There was some confusion regarding stopping blood pressure medication Azor versus stopping her Anoro.  She may have misunderstood that that information regarding changing of medications.  However she does have a stock supply of an oral at home that she could restart.  She did think it made a little bit of difference but was not sure.  At baseline she is dyspneic on exertion but she does not have any significant cough or daily sputum production.  OV 02/19/2019: recently in the hospital for TIA symptoms. No stroke. Now on plavix, taken off ASA. Followed planned with Dr. Julien Nordmann from oncology on 15th. Doing well since hospitalization. Concerned about her o2 levels. They were low a few times in  there hospital. Quit smoking in 2001.   OV 09/09/2019: Patient seen today for follow-up regarding her COPD.  Her symptoms are stable.  She denies chest pain nausea vomiting diarrhea.  She does have some shortness of breath with exertion.  She lives at home.  She is able to complete her activities of daily living.  She does have questions today regarding the Covid vaccine.  Patient was counseled on Covid vaccination and encouraged to obtain this next available opportunity.  OV 12/23/2019: Here today for follow-up regarding COPD.  Patient recently seen by Dr. Loanne Drilling for exacerbation and shortness of breath increasing shortness of breath.  She had inhaler change.  At this time doing well with the inhaler but still has some shortness of breath with significant exertion.  She wanted know if there is a different medication that may go to change to.  Denies weight loss denies hemoptysis.  She has follow-up scheduled with Dr. Earlie Server in July with a repeat CT scan.  OV 05/25/2020: Patient here today for follow-up regarding her COPD.  Overall doing well today.  Currently managed with her Trelegy inhaler.  She recently had palpitations and saw her cardiologist.  She has a Holter monitor in place attached to the left chest.  She has had a few fluttering events.  She does have dyspnea on exertion which has been stable for her.  She does need her flu shot today.  She has had both of her COVID-19 vaccinations.  She denies fevers chills night sweats weight loss  cough and sputum production.  OV 05/22/2021: Here today for 1 year follow-up.  Patient has no significant respiratory complaints today.  She feels short of breath when she exerts herself which is about her baseline she is currently managed with Trelegy and likes using this inhaler.  She would like to stay on it.  She had a recent CT scan of the chest in July which showed interval enlargement of a right upper lobe basilar nodule.  The lower lobe lesion appeared  stable.  We reviewed her CT scan today in the office.      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   Diverticulitis    Dyspnea    with exertion   Gastroesophageal reflux disease    occ   Glaucoma    right eye   Headache    tension   Headache syndrome 11/27/2018   Hiatal hernia    Hyperlipidemia    Hypertension    Iliac artery aneurysm (HCC)    Lung cancer (HCC) dx 2018   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   Myocardial infarction ([place]) 01/01/2000   Cardiac catheterization   Peripheral vascular disease (HCC)    stents in legs x 2 or 3   Pneumonia    last time winter 2017 -2018   PONV (postoperative nausea and vomiting)    occassionally, last colonscopy did ok with anesthesia   Reflux    Wears dentures    Full set   Wears glasses      Family History  Problem Relation Age of Onset   Heart disease Mother        Heart Disease before age 28   Hypertension Mother    Hyperlipidemia Mother    Heart attack Mother    Clotting disorder Mother    Heart disease Father        Heart Disease before age 34   Heart attack Father    Hyperlipidemia Father    Hypertension Father    Heart disease Brother        Heart Disease before age 22   Hyperlipidemia Brother    Hypertension Brother    Clotting disorder Brother    AAA (abdominal aortic aneurysm) Brother    Cerebral aneurysm Sister    Hypertension Sister    AAA (abdominal aortic aneurysm) Sister    Asthma Sister    Cerebral aneurysm Brother    Cancer Brother        Lung   Hypertension Brother    Heart attack  Brother    Heart disease Brother        Aneurysm of Brain   Hypertension Brother    Heart disease Brother    Heart disease Brother    Stroke Son        Aneurysm of Stomach   AAA (abdominal aortic aneurysm) Son    Cancer Maternal Uncle        great uncle/cancer/type unknown     Past Surgical History:  Procedure Laterality Date   ABDOMINAL HYSTERECTOMY  APPENDECTOMY     Arnold-chiari malformation repair  1998   Suboccipital craniectomy   CAROTID ENDARTERECTOMY  03/29/2010   Left  CEA   CHOLECYSTECTOMY     Gall Bladder   COLONOSCOPY WITH PROPOFOL N/A 04/22/2015   Procedure: COLONOSCOPY WITH PROPOFOL;  Surgeon: Carol Ada, MD;  Location: [facility];  Service: Endoscopy;  Laterality: N/A;   COLONOSCOPY WITH PROPOFOL N/A 05/25/2016   Procedure: COLONOSCOPY WITH PROPOFOL;  Surgeon: Carol Ada, MD;  Location: [facility];  Service: Endoscopy;  Laterality: N/A;   COLONOSCOPY WITH PROPOFOL N/A 05/03/2017   Procedure: COLONOSCOPY WITH PROPOFOL;  Surgeon: Carol Ada, MD;  Location: [facility];  Service: Endoscopy;  Laterality: N/A;   COLONOSCOPY WITH PROPOFOL N/A 06/29/2020   Procedure: COLONOSCOPY WITH PROPOFOL;  Surgeon: Carol Ada, MD;  Location: [facility];  Service: Endoscopy;  Laterality: N/A;   COLONOSCOPY WITH PROPOFOL N/A 01/05/2021   Procedure: COLONOSCOPY WITH PROPOFOL;  Surgeon: Carol Ada, MD;  Location: [facility];  Service: Endoscopy;  Laterality: N/A;   CORNEAL TRANSPLANT     Right   CORONARY ARTERY BYPASS GRAFT  01/01/2000   x 3   ENTEROSCOPY N/A 02/27/2018   Procedure: ENTEROSCOPY;  Surgeon: Carol Ada, MD;  [facility]: [facility];  Service: Endoscopy;  Laterality: N/A;   ENTEROSCOPY N/A 07/18/2018   Procedure: ENTEROSCOPY;  Surgeon: Carol Ada, MD;  [facility]: [facility];  Service: Endoscopy;  Laterality: N/A;   ENTEROSCOPY N/A 06/29/2020   Procedure: ENTEROSCOPY;  Surgeon: Carol Ada, MD;  [facility]: [facility];  Service: Endoscopy;   Laterality: N/A;   ESOPHAGOGASTRODUODENOSCOPY N/A 05/25/2016   Procedure: ESOPHAGOGASTRODUODENOSCOPY (EGD);  Surgeon: Carol Ada, MD;  Location: [facility];  Service: Endoscopy;  Laterality: N/A;   ESOPHAGOGASTRODUODENOSCOPY N/A 08/01/2018   Procedure: ESOPHAGOGASTRODUODENOSCOPY (EGD);  Surgeon: Carol Ada, MD;  Location: [facility];  Service: Endoscopy;  Laterality: N/A;   EYE SURGERY Right 1995 or 1996   Laser surgery for retinal hemorrhage   GIVENS CAPSULE STUDY N/A 07/16/2018   Procedure: GIVENS CAPSULE STUDY;  Surgeon: Carol Ada, MD;  Location: [facility];  Service: Endoscopy;  Laterality: N/A;   HEMOSTASIS CLIP PLACEMENT  06/29/2020   Procedure: HEMOSTASIS CLIP PLACEMENT;  Surgeon: Carol Ada, MD;  Location: [facility];  Service: Endoscopy;;   HEMOSTASIS CLIP PLACEMENT  01/05/2021   Procedure: HEMOSTASIS CLIP PLACEMENT;  Surgeon: Carol Ada, MD;  Location: [facility];  Service: Endoscopy;;   HOT HEMOSTASIS N/A 02/27/2018   Procedure: HOT HEMOSTASIS (ARGON PLASMA COAGULATION/BICAP);  Surgeon: Carol Ada, MD;  Location: [facility];  Service: Endoscopy;  Laterality: N/A;   HOT HEMOSTASIS N/A 08/01/2018   Procedure: HOT HEMOSTASIS (ARGON PLASMA COAGULATION/BICAP);  Surgeon: Carol Ada, MD;  Location: [facility];  Service: Endoscopy;  Laterality: N/A;   HOT HEMOSTASIS N/A 06/29/2020   Procedure: HOT HEMOSTASIS (ARGON PLASMA COAGULATION/BICAP);  Surgeon: Carol Ada, MD;  Location: [facility];  Service: Endoscopy;  Laterality: N/A;   HOT HEMOSTASIS N/A 01/05/2021   Procedure: HOT HEMOSTASIS (ARGON PLASMA COAGULATION/BICAP);  Surgeon: Carol Ada, MD;  Location: [facility];  Service: Endoscopy;  Laterality: N/A;   IR RADIOLOGIST EVAL & MGMT  12/14/2016   LEFT HEART CATH AND CORS/GRAFTS ANGIOGRAPHY N/A 01/09/2018   Procedure: LEFT HEART CATH AND CORS/GRAFTS ANGIOGRAPHY;  Surgeon: Charolette Forward, MD;  Location: [facility];  Service: Cardiovascular;   Laterality: N/A;   LEFT HEART CATHETERIZATION WITH CORONARY ANGIOGRAM N/A 08/03/2014   Procedure: LEFT HEART CATHETERIZATION WITH CORONARY ANGIOGRAM;  Surgeon: Birdie Riddle, MD;  Location:  [facility];  Service: Cardiovascular;  Laterality: N/A;   Post Coronary Artery  BPG  01/05/2000   Right jugular sheath removed   PR VEIN BYPASS GRAFT,AORTO-FEM-POP     ROTATOR CUFF REPAIR     Right    Social History   Socioeconomic History   Marital status: Widowed    Spouse name: Not on file   Number of children: 4   Years of education: 7TH   Highest education level: Not on file  Occupational History   Not on file  Tobacco Use   Smoking status: Former    Packs/day: 1.50    Years: 30.00    Pack years: 45.00    Types: Cigarettes    Quit date: 08/13/2000    Years since quitting: 20.7   Smokeless tobacco: Never  Vaping Use   Vaping Use: Never used  Substance and Sexual Activity   Alcohol use: No    Alcohol/week: 0.0 standard drinks   Drug use: No   Sexual activity: Never  Other Topics Concern   Not on file  Social History Narrative   Lives at home w/ her son   Right-handed   Drinks coffee and [employer] daily   Social Determinants of Health   Financial Resource Strain: Not on file  Food Insecurity: Not on file  Transportation Needs: Not on file  Physical Activity: Not on file  Stress: Not on file  Social Connections: Not on file  Intimate Partner Violence: Not on file     Allergies  Allergen Reactions   Azithromycin Swelling    Patient reported past history of lip swelling   Codeine Other (See Comments)    Dr. Terrence Dupont advised patient not to take this medication   Doxycycline Swelling    Mouth, lips, feet swelling   Hydromorphone Palpitations and Other (See Comments)    DILAUDID  -  Pt had a Heart Attack after taking Dilaudid.   Levaquin [Levofloxacin] Other (See Comments)    Chest pressure, SOB, "pain in between shoulder blades", sweaty -as reported by patient per experience  in ED this afternoon   Vitamin D Analogs Swelling   Avelox [Moxifloxacin Hcl In Nacl] Palpitations    Caused Heart Attack    Oxycodone-Acetaminophen Other (See Comments)    Says it makes her feel weird   Risedronate Other (See Comments)    Chest pain     Outpatient Medications Prior to Visit  Medication Sig Dispense Refill   acetaminophen (TYLENOL) 500 MG tablet Take 500-1,000 mg by mouth every 6 (six) hours as needed (pain).     albuterol (VENTOLIN HFA) 108 (90 Base) MCG/ACT inhaler Inhale 2 puffs into the lungs every 6 (six) hours as needed for wheezing or shortness of breath. 8 g 6   cetirizine (ZYRTEC) 10 MG tablet Take 10 mg by mouth daily as needed for allergies.      clopidogrel (PLAVIX) 75 MG tablet Take 1 tablet (75 mg total) by mouth daily. (Patient taking differently: Take 75 mg by mouth in the morning.) 90 tablet 3   dexlansoprazole (DEXILANT) 60 MG capsule Take 1 capsule (60 mg total) by mouth daily before breakfast. 30 capsule 11   Fluticasone-Umeclidin-Vilant (TRELEGY ELLIPTA) 200-62.5-25 MCG/INH AEPB Inhale 1 puff into the lungs daily. 60 each 6   furosemide (LASIX) 40 MG tablet Take 40 mg by mouth daily.     magnesium oxide (MAG-OX) 400 (241.3 Mg) MG tablet Take 400 mg by mouth in the morning.     NIFEdipine (  PROCARDIA-XL/NIFEDICAL-XL) 30 MG 24 hr tablet Take 30 mg by mouth in the morning.     nitroGLYCERIN (NITROSTAT) 0.4 MG SL tablet Place 0.4 mg under the tongue every 5 (five) minutes x 3 doses as needed for chest pain.     olmesartan (BENICAR) 20 MG tablet Take 1 tablet (20 mg total) by mouth daily. (Patient taking differently: Take 20 mg by mouth in the morning.) 90 tablet 1   polyethylene glycol powder (MIRALAX) 17 GM/SCOOP powder Take 17 g by mouth 2 (two) times daily as needed for moderate constipation. 255 g 1   rosuvastatin (CRESTOR) 10 MG tablet Take 10 mg by mouth at bedtime.      valACYclovir (VALTREX) 1000 MG tablet Take 1,000 mg by mouth 2 (two) times daily.      Facility-Administered Medications Prior to Visit  Medication Dose Route Frequency Provider Last Rate Last Admin   promethazine (PHENERGAN) injection 6.25-12.5 mg  6.25-12.5 mg Intravenous Q15 min PRN Brennan Bailey, MD        Review of Systems  Constitutional:  Negative for chills, fever, malaise/fatigue and weight loss.  HENT:  Negative for hearing loss, sore throat and tinnitus.   Eyes:  Negative for blurred vision and double vision.  Respiratory:  Positive for cough and shortness of breath. Negative for hemoptysis, sputum production, wheezing and stridor.   Cardiovascular:  Negative for chest pain, palpitations, orthopnea, leg swelling and PND.  Gastrointestinal:  Negative for abdominal pain, constipation, diarrhea, heartburn, nausea and vomiting.  Genitourinary:  Negative for dysuria, hematuria and urgency.  Musculoskeletal:  Negative for joint pain and myalgias.  Skin:  Negative for itching and rash.  Neurological:  Negative for dizziness, tingling, weakness and headaches.  Endo/Heme/Allergies:  Negative for environmental allergies. Does not bruise/bleed easily.  Psychiatric/Behavioral:  Negative for depression. The patient is not nervous/anxious and does not have insomnia.   All other systems reviewed and are negative.   Objective:  Physical Exam Vitals reviewed.  Constitutional:      General: She is not in acute distress.    Appearance: She is well-developed.  HENT:     Head: Normocephalic and atraumatic.  Eyes:     General: No scleral icterus.    Conjunctiva/sclera: Conjunctivae normal.     Pupils: Pupils are equal, round, and reactive to light.  Neck:     Vascular: No JVD.     Trachea: No tracheal deviation.  Cardiovascular:     Rate and Rhythm: Normal rate and regular rhythm.     Heart sounds: Normal heart sounds. No murmur heard. Pulmonary:     Effort: Pulmonary effort is normal. No tachypnea, accessory muscle usage or respiratory distress.     Breath sounds:  No stridor. No wheezing, rhonchi or rales.     Comments: Diminished breath sounds bilaterally no wheeze Abdominal:     General: Bowel sounds are normal. There is no distension.     Palpations: Abdomen is soft.     Tenderness: There is no abdominal tenderness.  Musculoskeletal:        General: No tenderness.     Cervical back: Neck supple.  Lymphadenopathy:     Cervical: No cervical adenopathy.  Skin:    General: Skin is warm and dry.     Capillary Refill: Capillary refill takes less than 2 seconds.     Findings: No rash.  Neurological:     Mental Status: She is alert and oriented to person, place, and time.  Psychiatric:  Behavior: Behavior normal.    Vitals:   05/22/21 1055  BP: 130/60  Pulse: 62  Temp: 97.8 F (36.6 C)  TempSrc: Oral  SpO2: 97%  Weight: 150 lb 9.6 oz (68.3 kg)  Height: 5\' 2"  (1.575 m)   97% on RA BMI Readings from Last 3 Encounters:  05/22/21 27.55 kg/m  03/01/21 26.89 kg/m  01/05/21 26.52 kg/m   Wt Readings from Last 3 Encounters:  05/22/21 150 lb 9.6 oz (68.3 kg)  03/01/21 147 lb (66.7 kg)  01/05/21 145 lb (65.8 kg)   Chest Imaging:  08/25/2018 CT Chest:  Imaging reviewed.  Right lower lobe areas of groundglass and scarring status post sterotactic radiation likely radiation-induced parenchymal scarring.  Small nodular density within this area 7 mm in size.  She also has a new associated/adjacent rib fracture within that right side which may be concerning and would need to be followed for any recurrence of squamous cell disease. The patient's images have been independently reviewed by me.    02/23/2019 CT Chest: Follow up scheduled for cancer eval and staging   Chest x-ray 11/10/2019: Chronic scarring in the bases.  Mildly hyperinflated lung fields consistent with COPD diagnosis. The patient's images have been independently reviewed by me.    02/27/2021: CT scan of the chest Irregular 1.4 cm sup solid anterior basilar right upper  lobe pulmonary nodule.  Slowly enlarging over time. Also has masslike fibrosis in the right lower lobe. The patient's images have been independently reviewed by me.     Pulmonary Functions Testing Results: PFT Results Latest Ref Rng & Units 08/26/2018 02/14/2018 04/10/2016  FVC-Pre L 1.69 1.80 1.53  FVC-Predicted Pre % 65 69 57  FVC-Post L 1.91 1.99 1.82  FVC-Predicted Post % 74 77 69  Pre FEV1/FVC % % 67 72 71  Post FEV1/FCV % % 66 72 70  FEV1-Pre L 1.12 1.30 1.09  FEV1-Predicted Pre % 58 67 54  FEV1-Post L 1.26 1.43 1.28  DLCO uncorrected ml/min/mmHg 11.93 - 11.07  DLCO UNC% % 55 - 51  DLCO corrected ml/min/mmHg 13.35 - -  DLCO COR %Predicted % 61 - -  DLVA Predicted % 97 - 86  TLC L 3.85 - 3.77  TLC % Predicted % 81 - 79  RV % Predicted % 94 - 96     FeNO: None   Pathology:   Squamous cell carcinoma of the lung right lower lobe status post stereotactic radiation therapy  Echocardiogram: none recent   Heart Catheterization:   Jan 09, 2018: Dr. Nelta Numbers LAD to Prox LAD lesion is 80% stenosed. Ost 1st Diag lesion is 100% stenosed. Prox RCA lesion is 20% stenosed. Mid RCA lesion is 30% stenosed. Mid LM to Dist LM lesion is 40% stenosed. Ost Ramus lesion is 40% stenosed. Ost Cx lesion is 40% stenosed. Origin lesion is 100% stenosed. Origin to Prox Graft lesion is 100% stenosed. The left ventricular systolic function is normal. LV end diastolic pressure is normal. The left ventricular ejection fraction is 50-55% by visual estimate.    Assessment & Plan:     ICD-10-CM   1. Lung nodule  R91.1 CT Super D Chest Wo Contrast    2. Stage 2 moderate COPD by GOLD classification ([place])  J44.9     3. Primary cancer of right lower lobe of lung (HCC)  C34.31     4. Hx of radiation therapy  Z92.3     5. Hx of cancer of lung  Z85.118  6. S/P CABG x 3  Z95.1       Assessment:   This is a 80 year old female managed for her stage II COPD, right lower lobe lung cancer  status post radiation.  Also has a history of CABG.  She has a right upper lobe basilar segment lung nodule that has slowly been enlarging does have a small area of central calcification.  This has slowly been getting bigger over time.  Question whether or not this could be a metachronous primary.  She has no evidence of recurrence of her previous radiation location.  Plan: She has a repeat CT scan of the chest in January. Lets change this to a super D formatted CT. If the nodule gets bigger I think we can consider biopsy of the right upper lobe lesion. Patient is agreeable to this plan. I will reach out to her medical oncologist Dr. Julien Nordmann to see if he has any additional thoughts. Otherwise we will see this patient back in January 2023 after repeat noncontrasted CT scan of the chest to discuss biopsy if the lesion is still there or getting bigger. From a COPD standpoint she needs to stay on Trelegy. Also think she needs to get out and exercise and walk more to keep her endurance up.   Current Outpatient Medications:    acetaminophen (TYLENOL) 500 MG tablet, Take 500-1,000 mg by mouth every 6 (six) hours as needed (pain)., Disp: , Rfl:    albuterol (VENTOLIN HFA) 108 (90 Base) MCG/ACT inhaler, Inhale 2 puffs into the lungs every 6 (six) hours as needed for wheezing or shortness of breath., Disp: 8 g, Rfl: 6   cetirizine (ZYRTEC) 10 MG tablet, Take 10 mg by mouth daily as needed for allergies. , Disp: , Rfl:    clopidogrel (PLAVIX) 75 MG tablet, Take 1 tablet (75 mg total) by mouth daily. (Patient taking differently: Take 75 mg by mouth in the morning.), Disp: 90 tablet, Rfl: 3   dexlansoprazole (DEXILANT) 60 MG capsule, Take 1 capsule (60 mg total) by mouth daily before breakfast., Disp: 30 capsule, Rfl: 11   Fluticasone-Umeclidin-Vilant (TRELEGY ELLIPTA) 200-62.5-25 MCG/INH AEPB, Inhale 1 puff into the lungs daily., Disp: 60 each, Rfl: 6   furosemide (LASIX) 40 MG tablet, Take 40 mg by mouth  daily., Disp: , Rfl:    magnesium oxide (MAG-OX) 400 (241.3 Mg) MG tablet, Take 400 mg by mouth in the morning., Disp: , Rfl:    NIFEdipine (PROCARDIA-XL/NIFEDICAL-XL) 30 MG 24 hr tablet, Take 30 mg by mouth in the morning., Disp: , Rfl:    nitroGLYCERIN (NITROSTAT) 0.4 MG SL tablet, Place 0.4 mg under the tongue every 5 (five) minutes x 3 doses as needed for chest pain., Disp: , Rfl:    olmesartan (BENICAR) 20 MG tablet, Take 1 tablet (20 mg total) by mouth daily. (Patient taking differently: Take 20 mg by mouth in the morning.), Disp: 90 tablet, Rfl: 1   polyethylene glycol powder (MIRALAX) 17 GM/SCOOP powder, Take 17 g by mouth 2 (two) times daily as needed for moderate constipation., Disp: 255 g, Rfl: 1   rosuvastatin (CRESTOR) 10 MG tablet, Take 10 mg by mouth at bedtime. , Disp: , Rfl:    valACYclovir (VALTREX) 1000 MG tablet, Take 1,000 mg by mouth 2 (two) times daily., Disp: , Rfl:  No current facility-administered medications for this visit.  Facility-Administered Medications Ordered in Other Visits:    promethazine (PHENERGAN) injection 6.25-12.5 mg, 6.25-12.5 mg, Intravenous, Q15 min PRN, Daiva Huge, Leanord Hawking, MD  Garner Nash, DO [facility] 05/22/2021 11:04 AM

## 2021-05-22 NOTE — Patient Instructions (Signed)
Thank you for visiting Dr. Valeta Harms at [facility]. Today we recommend the following:  Orders Placed This Encounter  Procedures   CT Super D Chest Wo Contrast   Repeat CT Chest in January  If the nodule gets bigger we will plan for biopsy  Continue trelegy.   Return in about 3 months (around 08/22/2021) for  Dr. Valeta Harms.    Please do your part to reduce the spread of COVID-19.

## 2021-05-24 ENCOUNTER — Telehealth: Payer: Self-pay

## 2021-05-24 NOTE — Telephone Encounter (Signed)
-----   Message from Garner Nash, DO sent at 05/22/2021  5:13 PM EDT ----- Regarding: let pt know... Can you let her know that Dr. Ellan Lambert office agrees with the plan? CT in Jan and follow up with me   Thanks  Leroy Sea    ----- Message ----- From: Heilingoetter, Tobe Sos, PA-C Sent: 05/22/2021   4:20 PM EDT To: Garner Nash, DO  Sounds like a plan. Thanks for the update. ----- Message ----- From: Garner Nash, DO Sent: 05/22/2021  11:11 AM EDT To: Curt Bears, MD, #  Hello, I was going to change her CT chest to a super d for follow up in jan. If its still there I can take her for biopsy? Sound like a plan?  Leroy Sea

## 2021-05-24 NOTE — Telephone Encounter (Signed)
Call made to patient, confirmed DOB. Made aware of BI recommendations. Voiced understanding.   Recall placed to get patient scheduled for F/U after CT 1/13.   Nothing further needed at this time.

## 2021-05-24 NOTE — Telephone Encounter (Signed)
ATC patient to go over message from Dr. Valeta Harms, [facility]

## 2021-06-01 ENCOUNTER — Telehealth: Payer: Self-pay | Admitting: Pulmonary Disease

## 2021-06-01 NOTE — Telephone Encounter (Signed)
Pt calling- was told to see BI after 1/13 but before 1/19. Pt has ct scan at 12:30 on 1/13. BI doesn't have any scheduled days as of now after 1/13, wanting to know if she schedules 1/13 late in the afternoon if BI will get results. Please [phone]

## 2021-06-01 NOTE — Telephone Encounter (Signed)
I have called the pt and she is aware of appt with BI on 1/13 after her super D ct.  Nothing further is needed.

## 2021-06-13 ENCOUNTER — Other Ambulatory Visit (HOSPITAL_COMMUNITY): Payer: Self-pay | Admitting: Interventional Radiology

## 2021-06-13 DIAGNOSIS — I671 Cerebral aneurysm, nonruptured: Secondary | ICD-10-CM

## 2021-06-15 ENCOUNTER — Other Ambulatory Visit: Payer: Self-pay | Admitting: *Deleted

## 2021-06-15 MED ORDER — TRELEGY ELLIPTA 200-62.5-25 MCG/ACT IN AEPB: 1.0000 | INHALATION_SPRAY | Freq: Every day | RESPIRATORY_TRACT | 3 refills | Status: DC

## 2021-06-30 ENCOUNTER — Ambulatory Visit (HOSPITAL_COMMUNITY)
Admission: RE | Admit: 2021-06-30 | Discharge: 2021-06-30 | Disposition: A | Discharge status: Home / Self Care | Payer: Medicare Other | Source: Ambulatory Visit | Attending: Interventional Radiology | Admitting: Interventional Radiology

## 2021-06-30 ENCOUNTER — Other Ambulatory Visit: Payer: Self-pay

## 2021-06-30 DIAGNOSIS — I671 Cerebral aneurysm, nonruptured: Secondary | ICD-10-CM | POA: Diagnosis present

## 2021-07-04 ENCOUNTER — Other Ambulatory Visit (HOSPITAL_COMMUNITY): Payer: Self-pay | Admitting: Interventional Radiology

## 2021-07-04 DIAGNOSIS — I771 Stricture of artery: Secondary | ICD-10-CM

## 2021-07-04 DIAGNOSIS — I671 Cerebral aneurysm, nonruptured: Secondary | ICD-10-CM

## 2021-07-24 ENCOUNTER — Other Ambulatory Visit: Payer: Self-pay

## 2021-07-24 ENCOUNTER — Ambulatory Visit (HOSPITAL_COMMUNITY)
Admission: RE | Admit: 2021-07-24 | Discharge: 2021-07-24 | Disposition: A | Discharge status: Home / Self Care | Payer: Medicare Other | Source: Ambulatory Visit | Attending: Interventional Radiology | Admitting: Interventional Radiology

## 2021-07-24 DIAGNOSIS — I671 Cerebral aneurysm, nonruptured: Secondary | ICD-10-CM

## 2021-07-24 DIAGNOSIS — I771 Stricture of artery: Secondary | ICD-10-CM

## 2021-07-26 HISTORY — PX: IR RADIOLOGIST EVAL & MGMT: IMG5224

## 2021-08-09 ENCOUNTER — Telehealth: Payer: Self-pay | Admitting: Pulmonary Disease

## 2021-08-09 NOTE — Telephone Encounter (Signed)
Spoke with the pt  She is c/o increased SOB and cough, progressively worse since appt in Oct 2022  OV with [facility] 08/16/21

## 2021-08-16 ENCOUNTER — Encounter: Payer: Self-pay | Admitting: Internal Medicine

## 2021-08-16 ENCOUNTER — Encounter: Payer: Self-pay | Admitting: Pulmonary Disease

## 2021-08-22 ENCOUNTER — Encounter: Payer: Self-pay | Admitting: Internal Medicine

## 2021-08-22 ENCOUNTER — Ambulatory Visit (HOSPITAL_COMMUNITY)
Admission: RE | Admit: 2021-08-22 | Discharge: 2021-08-22 | Disposition: A | Discharge status: Home / Self Care | Payer: Medicare Other | Source: Ambulatory Visit | Attending: Pulmonary Disease | Admitting: Pulmonary Disease

## 2021-08-22 ENCOUNTER — Other Ambulatory Visit: Payer: Self-pay

## 2021-08-22 DIAGNOSIS — R911 Solitary pulmonary nodule: Secondary | ICD-10-CM | POA: Insufficient documentation

## 2021-08-25 ENCOUNTER — Ambulatory Visit (HOSPITAL_COMMUNITY): Payer: Medicare Other

## 2021-08-25 ENCOUNTER — Encounter: Payer: Self-pay | Admitting: Pulmonary Disease

## 2021-08-25 ENCOUNTER — Ambulatory Visit (INDEPENDENT_AMBULATORY_CARE_PROVIDER_SITE_OTHER): Payer: Commercial Managed Care - HMO | Admitting: Pulmonary Disease

## 2021-08-25 ENCOUNTER — Other Ambulatory Visit (HOSPITAL_COMMUNITY): Payer: Medicare Other

## 2021-08-25 ENCOUNTER — Other Ambulatory Visit: Payer: Self-pay

## 2021-08-25 VITALS — BP 136/52 | HR 63 | Temp 97.7°F | Ht 62.0 in | Wt 152.2 lb

## 2021-08-25 DIAGNOSIS — C3431 Malignant neoplasm of lower lobe, right bronchus or lung: Secondary | ICD-10-CM

## 2021-08-25 DIAGNOSIS — Z951 Presence of aortocoronary bypass graft: Secondary | ICD-10-CM

## 2021-08-25 DIAGNOSIS — R911 Solitary pulmonary nodule: Secondary | ICD-10-CM | POA: Diagnosis not present

## 2021-08-25 DIAGNOSIS — Z923 Personal history of irradiation: Secondary | ICD-10-CM | POA: Diagnosis not present

## 2021-08-25 DIAGNOSIS — Z85118 Personal history of other malignant neoplasm of bronchus and lung: Secondary | ICD-10-CM

## 2021-08-25 DIAGNOSIS — J449 Chronic obstructive pulmonary disease, unspecified: Secondary | ICD-10-CM

## 2021-08-25 MED ORDER — BREZTRI AEROSPHERE 160-9-4.8 MCG/ACT IN AERO: 2.0000 | INHALATION_SPRAY | Freq: Two times a day (BID) | RESPIRATORY_TRACT | 0 refills | Status: DC

## 2021-08-25 NOTE — Progress Notes (Signed)
Synopsis: Referred in January 2020 to establish care, former patient of Dr. Lenna Gilford   Subjective:   PATIENT ID: [name] GENDER: female DOB: [birth year], MRN: [id number]  Chief Complaint  Patient presents with   Follow-up    Patient is here to go over ct results.     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  She is followed with Dr. Earlie Server and Dr. Tammi Klippel.  In the oncology services.  She has an appointment with him tomorrow.  She had a recent CT scan completed yesterday which reveals right lower lobe groundglass opacities and scarring after stereotactic radiation to the right lower lobe.  She also has a small 7 mm central nodular location that will need to be followed.  She had PFTs completed today prior to her office visit.  The pulmonary function test revealed moderate COPD with an FEV1 65% postbronchodilator reduced ratio of 66, significant bronchodilator response with greater than 12% improvement, as well as a reduced DLCO 55% predicted.  Patient states today that she quit using her Anora inhaler several months ago because she did not know if it was making much difference in her breathing symptoms.  Before that she liked being on it.  There was some confusion regarding stopping blood pressure medication Azor versus stopping her Anoro.  She may have misunderstood that that information regarding changing of medications.  However she does have a stock supply of an oral at home that she could restart.  She did think it made a little bit of difference but was not sure.  At baseline she is dyspneic on exertion but she does not have any significant cough or daily sputum production.  OV 02/19/2019: recently in the hospital for TIA symptoms. No stroke. Now on plavix, taken off ASA. Followed planned with Dr. Julien Nordmann from oncology on 15th. Doing well since hospitalization. Concerned about  her o2 levels. They were low a few times in there hospital. Quit smoking in 2001.   OV 09/09/2019: Patient seen today for follow-up regarding her COPD.  Her symptoms are stable.  She denies chest pain nausea vomiting diarrhea.  She does have some shortness of breath with exertion.  She lives at home.  She is able to complete her activities of daily living.  She does have questions today regarding the Covid vaccine.  Patient was counseled on Covid vaccination and encouraged to obtain this next available opportunity.  OV 12/23/2019: Here today for follow-up regarding COPD.  Patient recently seen by Dr. Loanne Drilling for exacerbation and shortness of breath increasing shortness of breath.  She had inhaler change.  At this time doing well with the inhaler but still has some shortness of breath with significant exertion.  She wanted know if there is a different medication that may go to change to.  Denies weight loss denies hemoptysis.  She has follow-up scheduled with Dr. Earlie Server in July with a repeat CT scan.  OV 05/25/2020: Patient here today for follow-up regarding her COPD.  Overall doing well today.  Currently managed with her Trelegy inhaler.  She recently had palpitations and saw her cardiologist.  She has a Holter monitor in place attached to the left chest.  She has had a few fluttering events.  She does have dyspnea on exertion which has been stable for her.  She does need her flu shot today.  She has had both of  her COVID-19 vaccinations.  She denies fevers chills night sweats weight loss cough and sputum production.  OV 05/22/2021: Here today for 1 year follow-up.  Patient has no significant respiratory complaints today.  She feels short of breath when she exerts herself which is about her baseline she is currently managed with Trelegy and likes using this inhaler.  She would like to stay on it.  She had a recent CT scan of the chest in July which showed interval enlargement of a right upper lobe basilar  nodule.  The lower lobe lesion appeared stable.  We reviewed her CT scan today in the office.  OV 08/25/2021: Here today for follow-up after recent CT scan of the chest.  Looking at a lung nodule in the anterior portion of the right upper lobe with associated groundglass calcification.  This appears stable.  From respiratory standpoint doing well using Trelegy.  She does feel short of breath with exertion but she feels like it is around her baseline.      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   Diverticulitis    Dyspnea    with exertion   Gastroesophageal reflux disease    occ   Glaucoma    right eye   Headache    tension   Headache syndrome 11/27/2018   Hiatal hernia    Hyperlipidemia    Hypertension    Iliac artery aneurysm (HCC)    Lung cancer (HCC) dx 2018   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   Myocardial infarction ([place]) 01/01/2000   Cardiac catheterization   Peripheral vascular disease (HCC)    stents in legs x 2 or 3   Pneumonia    last time winter 2017 -2018   PONV (postoperative nausea and vomiting)    occassionally, last colonscopy did ok with anesthesia   Reflux    Wears dentures    Full set   Wears glasses      Family History  Problem Relation Age of Onset   Heart disease Mother        Heart Disease before age 62   Hypertension Mother    Hyperlipidemia Mother    Heart attack Mother    Clotting disorder Mother    Heart disease Father        Heart Disease before age 21   Heart attack Father    Hyperlipidemia Father    Hypertension Father     Heart disease Brother        Heart Disease before age 25   Hyperlipidemia Brother    Hypertension Brother    Clotting disorder Brother    AAA (abdominal aortic aneurysm) Brother    Cerebral aneurysm Sister    Hypertension Sister    AAA (abdominal aortic aneurysm) Sister    Asthma Sister    Cerebral aneurysm Brother    Cancer Brother        Lung   Hypertension Brother    Heart attack Brother    Heart disease Brother  Aneurysm of Brain   Hypertension Brother    Heart disease Brother    Heart disease Brother    Stroke Son        Aneurysm of Stomach   AAA (abdominal aortic aneurysm) Son    Cancer Maternal Uncle        great uncle/cancer/type unknown     Past Surgical History:  Procedure Laterality Date   ABDOMINAL HYSTERECTOMY     APPENDECTOMY     Arnold-chiari malformation repair  1998   Suboccipital craniectomy   CAROTID ENDARTERECTOMY  03/29/2010   Left  CEA   CHOLECYSTECTOMY     Gall Bladder   COLONOSCOPY WITH PROPOFOL N/A 04/22/2015   Procedure: COLONOSCOPY WITH PROPOFOL;  Surgeon: Carol Ada, MD;  Location: [facility];  Service: Endoscopy;  Laterality: N/A;   COLONOSCOPY WITH PROPOFOL N/A 05/25/2016   Procedure: COLONOSCOPY WITH PROPOFOL;  Surgeon: Carol Ada, MD;  Location: [facility];  Service: Endoscopy;  Laterality: N/A;   COLONOSCOPY WITH PROPOFOL N/A 05/03/2017   Procedure: COLONOSCOPY WITH PROPOFOL;  Surgeon: Carol Ada, MD;  Location: [facility];  Service: Endoscopy;  Laterality: N/A;   COLONOSCOPY WITH PROPOFOL N/A 06/29/2020   Procedure: COLONOSCOPY WITH PROPOFOL;  Surgeon: Carol Ada, MD;  Location: [facility];  Service: Endoscopy;  Laterality: N/A;   COLONOSCOPY WITH PROPOFOL N/A 01/05/2021   Procedure: COLONOSCOPY WITH PROPOFOL;  Surgeon: Carol Ada, MD;  Location: [facility];  Service: Endoscopy;  Laterality: N/A;   CORNEAL TRANSPLANT     Right   CORONARY ARTERY BYPASS GRAFT  01/01/2000   x 3   ENTEROSCOPY N/A 02/27/2018    Procedure: ENTEROSCOPY;  Surgeon: Carol Ada, MD;  Location: [facility];  Service: Endoscopy;  Laterality: N/A;   ENTEROSCOPY N/A 07/18/2018   Procedure: ENTEROSCOPY;  Surgeon: Carol Ada, MD;  Location: [facility];  Service: Endoscopy;  Laterality: N/A;   ENTEROSCOPY N/A 06/29/2020   Procedure: ENTEROSCOPY;  Surgeon: Carol Ada, MD;  Location: [facility];  Service: Endoscopy;  Laterality: N/A;   ESOPHAGOGASTRODUODENOSCOPY N/A 05/25/2016   Procedure: ESOPHAGOGASTRODUODENOSCOPY (EGD);  Surgeon: Carol Ada, MD;  Location: [facility];  Service: Endoscopy;  Laterality: N/A;   ESOPHAGOGASTRODUODENOSCOPY N/A 08/01/2018   Procedure: ESOPHAGOGASTRODUODENOSCOPY (EGD);  Surgeon: Carol Ada, MD;  Location: [facility];  Service: Endoscopy;  Laterality: N/A;   EYE SURGERY Right 1995 or 1996   Laser surgery for retinal hemorrhage   GIVENS CAPSULE STUDY N/A 07/16/2018   Procedure: GIVENS CAPSULE STUDY;  Surgeon: Carol Ada, MD;  Location: [facility];  Service: Endoscopy;  Laterality: N/A;   HEMOSTASIS CLIP PLACEMENT  06/29/2020   Procedure: HEMOSTASIS CLIP PLACEMENT;  Surgeon: Carol Ada, MD;  Location: [facility];  Service: Endoscopy;;   HEMOSTASIS CLIP PLACEMENT  01/05/2021   Procedure: HEMOSTASIS CLIP PLACEMENT;  Surgeon: Carol Ada, MD;  Location: [facility];  Service: Endoscopy;;   HOT HEMOSTASIS N/A 02/27/2018   Procedure: HOT HEMOSTASIS (ARGON PLASMA COAGULATION/BICAP);  Surgeon: Carol Ada, MD;  Location: [facility];  Service: Endoscopy;  Laterality: N/A;   HOT HEMOSTASIS N/A 08/01/2018   Procedure: HOT HEMOSTASIS (ARGON PLASMA COAGULATION/BICAP);  Surgeon: Carol Ada, MD;  Location: [facility];  Service: Endoscopy;  Laterality: N/A;   HOT HEMOSTASIS N/A 06/29/2020   Procedure: HOT HEMOSTASIS (ARGON PLASMA COAGULATION/BICAP);  Surgeon: Carol Ada, MD;  Location: [facility];  Service: Endoscopy;  Laterality: N/A;   HOT HEMOSTASIS N/A 01/05/2021    Procedure: HOT HEMOSTASIS (ARGON PLASMA COAGULATION/BICAP);  Surgeon: Carol Ada, MD;  Location: [facility];  Service:  Endoscopy;  Laterality: N/A;   IR RADIOLOGIST EVAL & MGMT  12/14/2016   IR RADIOLOGIST EVAL & MGMT  07/26/2021   LEFT HEART CATH AND CORS/GRAFTS ANGIOGRAPHY N/A 01/09/2018   Procedure: LEFT HEART CATH AND CORS/GRAFTS ANGIOGRAPHY;  Surgeon: Charolette Forward, MD;  Location: [facility];  Service: Cardiovascular;  Laterality: N/A;   LEFT HEART CATHETERIZATION WITH CORONARY ANGIOGRAM N/A 08/03/2014   Procedure: LEFT HEART CATHETERIZATION WITH CORONARY ANGIOGRAM;  Surgeon: Birdie Riddle, MD;  Location: [place] [facility];  Service: Cardiovascular;  Laterality: N/A;   Post Coronary Artery  BPG  01/05/2000   Right jugular sheath removed   PR VEIN BYPASS GRAFT,AORTO-FEM-POP     ROTATOR CUFF REPAIR     Right    Social History   Socioeconomic History   Marital status: Widowed    Spouse name: Not on file   Number of children: 4   Years of education: 7TH   Highest education level: Not on file  Occupational History   Not on file  Tobacco Use   Smoking status: Former    Packs/day: 1.50    Years: 30.00    Pack years: 45.00    Types: Cigarettes    Quit date: 08/13/2000    Years since quitting: 21.0   Smokeless tobacco: Never  Vaping Use   Vaping Use: Never used  Substance and Sexual Activity   Alcohol use: No    Alcohol/week: 0.0 standard drinks   Drug use: No   Sexual activity: Never  Other Topics Concern   Not on file  Social History Narrative   Lives at home w/ her son   Right-handed   Drinks coffee and [employer] daily   Social Determinants of Health   Financial Resource Strain: Not on file  Food Insecurity: Not on file  Transportation Needs: Not on file  Physical Activity: Not on file  Stress: Not on file  Social Connections: Not on file  Intimate Partner Violence: Not on file     Allergies  Allergen Reactions   Azithromycin Swelling    Patient reported  past history of lip swelling   Codeine Other (See Comments)    Dr. Terrence Dupont advised patient not to take this medication   Doxycycline Swelling    Mouth, lips, feet swelling   Hydromorphone Palpitations and Other (See Comments)    DILAUDID  -  Pt had a Heart Attack after taking Dilaudid.   Levaquin [Levofloxacin] Other (See Comments)    Chest pressure, SOB, "pain in between shoulder blades", sweaty -as reported by patient per experience in ED this afternoon   Vitamin D Analogs Swelling   Avelox [Moxifloxacin Hcl In Nacl] Palpitations    Caused Heart Attack    Oxycodone-Acetaminophen Other (See Comments)    Says it makes her feel weird   Risedronate Other (See Comments)    Chest pain     Outpatient Medications Prior to Visit  Medication Sig Dispense Refill   acetaminophen (TYLENOL) 500 MG tablet Take 500-1,000 mg by mouth every 6 (six) hours as needed (pain).     albuterol (VENTOLIN HFA) 108 (90 Base) MCG/ACT inhaler Inhale 2 puffs into the lungs every 6 (six) hours as needed for wheezing or shortness of breath. 8 g 6   cetirizine (ZYRTEC) 10 MG tablet Take 10 mg by mouth daily as needed for allergies.      clopidogrel (PLAVIX) 75 MG tablet Take 1 tablet (75 mg total) by mouth daily. (Patient taking differently: Take 75 mg  by mouth in the morning.) 90 tablet 3   dexlansoprazole (DEXILANT) 60 MG capsule Take 1 capsule (60 mg total) by mouth daily before breakfast. 30 capsule 11   Fluticasone-Umeclidin-Vilant (TRELEGY ELLIPTA) 200-62.5-25 MCG/ACT AEPB Inhale 1 puff into the lungs daily. 180 each 3   furosemide (LASIX) 40 MG tablet Take 40 mg by mouth daily.     magnesium oxide (MAG-OX) 400 (241.3 Mg) MG tablet Take 400 mg by mouth in the morning.     NIFEdipine (PROCARDIA-XL/NIFEDICAL-XL) 30 MG 24 hr tablet Take 30 mg by mouth in the morning.     nitroGLYCERIN (NITROSTAT) 0.4 MG SL tablet Place 0.4 mg under the tongue every 5 (five) minutes x 3 doses as needed for chest pain.     olmesartan  (BENICAR) 20 MG tablet Take 1 tablet (20 mg total) by mouth daily. (Patient taking differently: Take 20 mg by mouth in the morning.) 90 tablet 1   polyethylene glycol powder (MIRALAX) 17 GM/SCOOP powder Take 17 g by mouth 2 (two) times daily as needed for moderate constipation. 255 g 1   rosuvastatin (CRESTOR) 10 MG tablet Take 10 mg by mouth at bedtime.      valACYclovir (VALTREX) 1000 MG tablet Take 1,000 mg by mouth 2 (two) times daily.     Facility-Administered Medications Prior to Visit  Medication Dose Route Frequency Provider Last Rate Last Admin   promethazine (PHENERGAN) injection 6.25-12.5 mg  6.25-12.5 mg Intravenous Q15 min PRN Brennan Bailey, MD        Review of Systems  Constitutional:  Negative for chills, fever, malaise/fatigue and weight loss.  HENT:  Negative for hearing loss, sore throat and tinnitus.   Eyes:  Negative for blurred vision and double vision.  Respiratory:  Positive for shortness of breath. Negative for cough, hemoptysis, sputum production, wheezing and stridor.   Cardiovascular:  Negative for chest pain, palpitations, orthopnea, leg swelling and PND.  Gastrointestinal:  Negative for abdominal pain, constipation, diarrhea, heartburn, nausea and vomiting.  Genitourinary:  Negative for dysuria, hematuria and urgency.  Musculoskeletal:  Negative for joint pain and myalgias.  Skin:  Negative for itching and rash.  Neurological:  Negative for dizziness, tingling, weakness and headaches.  Endo/Heme/Allergies:  Negative for environmental allergies. Does not bruise/bleed easily.  Psychiatric/Behavioral:  Negative for depression. The patient is not nervous/anxious and does not have insomnia.   All other systems reviewed and are negative.   Objective:  Physical Exam Vitals reviewed.  Constitutional:      General: She is not in acute distress.    Appearance: She is well-developed.  HENT:     Head: Normocephalic and atraumatic.  Eyes:     General: No scleral  icterus.    Conjunctiva/sclera: Conjunctivae normal.     Pupils: Pupils are equal, round, and reactive to light.  Neck:     Vascular: No JVD.     Trachea: No tracheal deviation.  Cardiovascular:     Rate and Rhythm: Normal rate and regular rhythm.     Heart sounds: Normal heart sounds. No murmur heard. Pulmonary:     Effort: Pulmonary effort is normal. No tachypnea, accessory muscle usage or respiratory distress.     Breath sounds: No stridor. No wheezing, rhonchi or rales.     Comments: Diminished breath sounds bilaterally Abdominal:     General: There is no distension.     Palpations: Abdomen is soft.     Tenderness: There is no abdominal tenderness.  Musculoskeletal:  General: No tenderness.     Cervical back: Neck supple.  Lymphadenopathy:     Cervical: No cervical adenopathy.  Skin:    General: Skin is warm and dry.     Capillary Refill: Capillary refill takes less than 2 seconds.     Findings: No rash.  Neurological:     Mental Status: She is alert and oriented to person, place, and time.  Psychiatric:        Behavior: Behavior normal.    Vitals:   08/25/21 1520  BP: (!) 136/52  Pulse: 63  Temp: 97.7 F (36.5 C)  TempSrc: Oral  SpO2: 98%  Weight: 152 lb 3.2 oz (69 kg)  Height: 5\' 2"  (1.575 m)   98% on RA BMI Readings from Last 3 Encounters:  08/25/21 27.84 kg/m  05/22/21 27.55 kg/m  03/01/21 26.89 kg/m   Wt Readings from Last 3 Encounters:  08/25/21 152 lb 3.2 oz (69 kg)  05/22/21 150 lb 9.6 oz (68.3 kg)  03/01/21 147 lb (66.7 kg)   Chest Imaging:  08/25/2018 CT Chest:  Imaging reviewed.  Right lower lobe areas of groundglass and scarring status post sterotactic radiation likely radiation-induced parenchymal scarring.  Small nodular density within this area 7 mm in size.  She also has a new associated/adjacent rib fracture within that right side which may be concerning and would need to be followed for any recurrence of squamous cell  disease. The patient's images have been independently reviewed by me.    02/23/2019 CT Chest: Follow up scheduled for cancer eval and staging   Chest x-ray 11/10/2019: Chronic scarring in the bases.  Mildly hyperinflated lung fields consistent with COPD diagnosis. The patient's images have been independently reviewed by me.    02/27/2021: CT scan of the chest Irregular 1.4 cm sup solid anterior basilar right upper lobe pulmonary nodule.  Slowly enlarging over time. Also has masslike fibrosis in the right lower lobe. The patient's images have been independently reviewed by me.    08/22/2021 CT chest: Nodule within the right lower lobe stable post treatments.  Right upper lobe anterior lung nodule stable in appearance The patient's images have been independently reviewed by me.     Pulmonary Functions Testing Results: PFT Results Latest Ref Rng & Units 08/26/2018 02/14/2018 04/10/2016  FVC-Pre L 1.69 1.80 1.53  FVC-Predicted Pre % 65 69 57  FVC-Post L 1.91 1.99 1.82  FVC-Predicted Post % 74 77 69  Pre FEV1/FVC % % 67 72 71  Post FEV1/FCV % % 66 72 70  FEV1-Pre L 1.12 1.30 1.09  FEV1-Predicted Pre % 58 67 54  FEV1-Post L 1.26 1.43 1.28  DLCO uncorrected ml/min/mmHg 11.93 - 11.07  DLCO UNC% % 55 - 51  DLCO corrected ml/min/mmHg 13.35 - -  DLCO COR %Predicted % 61 - -  DLVA Predicted % 97 - 86  TLC L 3.85 - 3.77  TLC % Predicted % 81 - 79  RV % Predicted % 94 - 96     FeNO: None   Pathology:   Squamous cell carcinoma of the lung right lower lobe status post stereotactic radiation therapy  Echocardiogram: none recent   Heart Catheterization:   Jan 09, 2018: Dr. Nelta Numbers LAD to Prox LAD lesion is 80% stenosed. Ost 1st Diag lesion is 100% stenosed. Prox RCA lesion is 20% stenosed. Mid RCA lesion is 30% stenosed. Mid LM to Dist LM lesion is 40% stenosed. Ost Ramus lesion is 40% stenosed. Ost Cx lesion is 40%  stenosed. Origin lesion is 100% stenosed. Origin to Prox Graft  lesion is 100% stenosed. The left ventricular systolic function is normal. LV end diastolic pressure is normal. The left ventricular ejection fraction is 50-55% by visual estimate.    Assessment & Plan:     ICD-10-CM   1. Lung nodule  R91.1     2. Stage 2 moderate COPD by GOLD classification ([place])  J44.9     3. Primary cancer of right lower lobe of lung (HCC)  C34.31     4. Hx of radiation therapy  Z92.3     5. Hx of cancer of lung  Z85.118     6. S/P CABG x 3  Z95.1       Assessment:   This is a 81 year old female, seen in clinic managed for stage II COPD, history of right lower lobe lung cancer status post radiation, history of CABG, right upper lobe basilar segment of nodule that has slowly been enlarging over time with areas of central calcification.  This nodule appears stable.  Plan: Follow-up with medical oncology which is scheduled next week. Continue Trelegy for COPD. I think we can continue to follow her right upper lobe nodule. She will have scans scheduled by oncology and if the nodule gets bigger we can always consider sampling for them if needed. Return to clinic in 6 months   Current Outpatient Medications:    acetaminophen (TYLENOL) 500 MG tablet, Take 500-1,000 mg by mouth every 6 (six) hours as needed (pain)., Disp: , Rfl:    albuterol (VENTOLIN HFA) 108 (90 Base) MCG/ACT inhaler, Inhale 2 puffs into the lungs every 6 (six) hours as needed for wheezing or shortness of breath., Disp: 8 g, Rfl: 6   Budeson-Glycopyrrol-Formoterol (BREZTRI AEROSPHERE) 160-9-4.8 MCG/ACT AERO, Inhale 2 puffs into the lungs in the morning and at bedtime., Disp: 10.7 g, Rfl: 0   cetirizine (ZYRTEC) 10 MG tablet, Take 10 mg by mouth daily as needed for allergies. , Disp: , Rfl:    clopidogrel (PLAVIX) 75 MG tablet, Take 1 tablet (75 mg total) by mouth daily. (Patient taking differently: Take 75 mg by mouth in the morning.), Disp: 90 tablet, Rfl: 3   dexlansoprazole (DEXILANT) 60 MG  capsule, Take 1 capsule (60 mg total) by mouth daily before breakfast., Disp: 30 capsule, Rfl: 11   Fluticasone-Umeclidin-Vilant (TRELEGY ELLIPTA) 200-62.5-25 MCG/ACT AEPB, Inhale 1 puff into the lungs daily., Disp: 180 each, Rfl: 3   furosemide (LASIX) 40 MG tablet, Take 40 mg by mouth daily., Disp: , Rfl:    magnesium oxide (MAG-OX) 400 (241.3 Mg) MG tablet, Take 400 mg by mouth in the morning., Disp: , Rfl:    NIFEdipine (PROCARDIA-XL/NIFEDICAL-XL) 30 MG 24 hr tablet, Take 30 mg by mouth in the morning., Disp: , Rfl:    nitroGLYCERIN (NITROSTAT) 0.4 MG SL tablet, Place 0.4 mg under the tongue every 5 (five) minutes x 3 doses as needed for chest pain., Disp: , Rfl:    olmesartan (BENICAR) 20 MG tablet, Take 1 tablet (20 mg total) by mouth daily. (Patient taking differently: Take 20 mg by mouth in the morning.), Disp: 90 tablet, Rfl: 1   polyethylene glycol powder (MIRALAX) 17 GM/SCOOP powder, Take 17 g by mouth 2 (two) times daily as needed for moderate constipation., Disp: 255 g, Rfl: 1   rosuvastatin (CRESTOR) 10 MG tablet, Take 10 mg by mouth at bedtime. , Disp: , Rfl:    valACYclovir (VALTREX) 1000 MG tablet, Take 1,000 mg by  mouth 2 (two) times daily., Disp: , Rfl:  No current facility-administered medications for this visit.  Facility-Administered Medications Ordered in Other Visits:    promethazine (PHENERGAN) injection 6.25-12.5 mg, 6.25-12.5 mg, Intravenous, Q15 min PRN, Daiva Huge, Leanord Hawking, MD    Garner Nash, DO [facility] 08/26/2021 11:32 AM

## 2021-08-25 NOTE — Patient Instructions (Signed)
Thank you for visiting Dr. Valeta Harms at [facility]. Today we recommend the following:  Your nodule is stable.  Continue follow up planned with oncology  Breztri samples today and let us know if you want a new prescription.   Return in about 6 months (around 02/22/2022), or if symptoms worsen or fail to improve, for with APP or Dr. Valeta Harms.    Please do your part to reduce the spread of COVID-19.

## 2021-08-29 NOTE — Progress Notes (Signed)
[facility] [place] OFFICE PROGRESS NOTE  [name], MD [address] [place] [id number]  DIAGNOSIS: 1) stage IA non-small cell lung cancer, squamous cell carcinoma presented with right lower lobe pulmonary nodule. 2) persistent anemia questionable for anemia of chronic disease +/-iron deficiency.  PRIOR THERAPY: 1) Feraheme infusion last dose was given 06/20/2016. 2) stereotactic radiotherapy to the right lower lobe lung nodule under the care of Dr. Tammi Klippel   CURRENT THERAPY: Observation   INTERVAL HISTORY: [name] 81 y.o. female returns to the clinic today for a follow-up visit accompanied by her daughter.  The patient was last seen in the clinic 6 months ago on 03/01/2021.  The patient has a history of stage Ia lung cancer.  We are monitoring a right upper lobe anterior lung nodule with groundglass calcification.  The patient is feeling fairly well except she continues to have dyspnea on exertion.  She sees Dr. Valeta Harms from pulmonology.  She recently had a CT super D on 08/22/2021. She had a follow-up with Dr. Valeta Harms a few days later.  Lung nodules found to be stable.  We are keeping an eye on this nodule and if it continues to grow in the future we can consider SBRT versus proceed with biopsy.   Additionally regarding her shortness of breath, the patient has a history of bleeding AVMs and iron deficiency anemia.  She was previously seen by Dr. Benson Norway.  Her most recent colonoscopy was on 01/05/2021.  She has small bowel endoscopy on 06/29/2020.  The patient is unable to tolerate iron supplements reportedly.  Her last IV iron infusion with Feraheme was on 07/04/2020.  The patient reports that she has had dark stools recently but thought it may be attributed to "eating dark chocolate". She also reports some often gets an upset stomach.  The patient denies any recent fever, chills, night sweats, or unexplained weight loss.  She has a baseline productive cough which  produces mucus.  Denies any chest pain or hemoptysis.  She denies any headache or visual changes except for her baseline visual changes associated with macular degeneration.  She sees an ophthalmologist. She also reports chronic back pain. Her scans show degenerative disease in her back. The patient recently had a restaging CT scan performed.  She is here today for evaluation to review her scan results.   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   Asthma    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   Blood in stool    last week of aug 2018   Carotid artery occlusion    Complication of anesthesia    COPD (chronic obstructive pulmonary disease) (HCC)    Coronary artery disease    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   Diverticulitis    Dyspnea    with exertion   Gastroesophageal reflux disease    occ   Glaucoma    right eye   Headache    tension   Headache syndrome 11/27/2018   Hiatal hernia    Hyperlipidemia    Hypertension    Iliac artery aneurysm (HCC)    Lung cancer ([place]) dx 2018   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   Myocardial infarction ([facility]) 01/01/2000   Cardiac catheterization   Peripheral vascular disease ([facility])    stents in legs x 2 or 3  Pneumonia    last time winter 2017 -2018   PONV (postoperative nausea and vomiting)    occassionally, last colonscopy did ok with anesthesia   Reflux    Wears dentures    Full set   Wears glasses     ALLERGIES:  is allergic to azithromycin, codeine, doxycycline, hydromorphone, levaquin [levofloxacin], vitamin d analogs, avelox [moxifloxacin hcl in nacl], oxycodone-acetaminophen, and risedronate.  MEDICATIONS:  Current Outpatient Medications  Medication Sig Dispense Refill   acetaminophen (TYLENOL) 500 MG tablet Take 500-1,000 mg by mouth every 6 (six) hours as needed (pain).      albuterol (VENTOLIN HFA) 108 (90 Base) MCG/ACT inhaler Inhale 2 puffs into the lungs every 6 (six) hours as needed for wheezing or shortness of breath. 8 g 6   cetirizine (ZYRTEC) 10 MG tablet Take 10 mg by mouth daily as needed for allergies.      clopidogrel (PLAVIX) 75 MG tablet Take 1 tablet (75 mg total) by mouth daily. (Patient taking differently: Take 75 mg by mouth in the morning.) 90 tablet 3   dexlansoprazole (DEXILANT) 60 MG capsule Take 1 capsule (60 mg total) by mouth daily before breakfast. 30 capsule 11   Fluticasone-Umeclidin-Vilant (TRELEGY ELLIPTA) 200-62.5-25 MCG/ACT AEPB Inhale 1 puff into the lungs daily. 180 each 3   furosemide (LASIX) 40 MG tablet Take 40 mg by mouth daily.     magnesium oxide (MAG-OX) 400 (241.3 Mg) MG tablet Take 400 mg by mouth in the morning.     NIFEdipine (PROCARDIA-XL/NIFEDICAL-XL) 30 MG 24 hr tablet Take 30 mg by mouth in the morning.     nitroGLYCERIN (NITROSTAT) 0.4 MG SL tablet Place 0.4 mg under the tongue every 5 (five) minutes x 3 doses as needed for chest pain.     olmesartan (BENICAR) 20 MG tablet Take 1 tablet (20 mg total) by mouth daily. (Patient taking differently: Take 20 mg by mouth in the morning.) 90 tablet 1   ondansetron (ZOFRAN-ODT) 4 MG disintegrating tablet Take by mouth.     polyethylene glycol powder (MIRALAX) 17 GM/SCOOP powder Take 17 g by mouth 2 (two) times daily as needed for moderate constipation. 255 g 1   rosuvastatin (CRESTOR) 10 MG tablet Take 10 mg by mouth at bedtime.      valACYclovir (VALTREX) 1000 MG tablet Take 1,000 mg by mouth 2 (two) times daily.     No current facility-administered medications for this visit.   Facility-Administered Medications Ordered in Other Visits  Medication Dose Route Frequency Provider Last Rate Last Admin   promethazine (PHENERGAN) injection 6.25-12.5 mg  6.25-12.5 mg Intravenous Q15 min PRN Brennan Bailey, MD        SURGICAL HISTORY:  Past Surgical History:  Procedure  Laterality Date   ABDOMINAL HYSTERECTOMY     APPENDECTOMY     Arnold-chiari malformation repair  1998   Suboccipital craniectomy   CAROTID ENDARTERECTOMY  03/29/2010   Left  CEA   CHOLECYSTECTOMY     Gall Bladder   COLONOSCOPY WITH PROPOFOL N/A 04/22/2015   Procedure: COLONOSCOPY WITH PROPOFOL;  Surgeon: Carol Ada, MD;  Location: [facility];  Service: Endoscopy;  Laterality: N/A;   COLONOSCOPY WITH PROPOFOL N/A 05/25/2016   Procedure: COLONOSCOPY WITH PROPOFOL;  Surgeon: Carol Ada, MD;  Location: [facility];  Service: Endoscopy;  Laterality: N/A;   COLONOSCOPY WITH PROPOFOL N/A 05/03/2017   Procedure: COLONOSCOPY WITH PROPOFOL;  Surgeon: Carol Ada, MD;  Location: [facility];  Service: Endoscopy;  Laterality: N/A;   COLONOSCOPY  WITH PROPOFOL N/A 06/29/2020   Procedure: COLONOSCOPY WITH PROPOFOL;  Surgeon: Carol Ada, MD;  Location: [facility];  Service: Endoscopy;  Laterality: N/A;   COLONOSCOPY WITH PROPOFOL N/A 01/05/2021   Procedure: COLONOSCOPY WITH PROPOFOL;  Surgeon: Carol Ada, MD;  [facility]: [facility];  Service: Endoscopy;  Laterality: N/A;   CORNEAL TRANSPLANT     Right   CORONARY ARTERY BYPASS GRAFT  01/01/2000   x 3   ENTEROSCOPY N/A 02/27/2018   Procedure: ENTEROSCOPY;  Surgeon: Carol Ada, MD;  [facility]: [facility];  Service: Endoscopy;  Laterality: N/A;   ENTEROSCOPY N/A 07/18/2018   Procedure: ENTEROSCOPY;  Surgeon: Carol Ada, MD;  [facility]: [facility];  Service: Endoscopy;  Laterality: N/A;   ENTEROSCOPY N/A 06/29/2020   Procedure: ENTEROSCOPY;  Surgeon: Carol Ada, MD;  [facility]: [facility];  Service: Endoscopy;  Laterality: N/A;   ESOPHAGOGASTRODUODENOSCOPY N/A 05/25/2016   Procedure: ESOPHAGOGASTRODUODENOSCOPY (EGD);  Surgeon: Carol Ada, MD;  Location: [facility];  Service: Endoscopy;  Laterality: N/A;   ESOPHAGOGASTRODUODENOSCOPY N/A 08/01/2018   Procedure: ESOPHAGOGASTRODUODENOSCOPY (EGD);  Surgeon: Carol Ada, MD;   Location: [facility];  Service: Endoscopy;  Laterality: N/A;   EYE SURGERY Right 1995 or 1996   Laser surgery for retinal hemorrhage   GIVENS CAPSULE STUDY N/A 07/16/2018   Procedure: GIVENS CAPSULE STUDY;  Surgeon: Carol Ada, MD;  [facility]: [facility];  Service: Endoscopy;  Laterality: N/A;   HEMOSTASIS CLIP PLACEMENT  06/29/2020   Procedure: HEMOSTASIS CLIP PLACEMENT;  Surgeon: Carol Ada, MD;  [facility];  Service: Endoscopy;;   HEMOSTASIS CLIP PLACEMENT  01/05/2021   Procedure: HEMOSTASIS CLIP PLACEMENT;  Surgeon: Carol Ada, MD;  [facility]: [facility];  Service: Endoscopy;;   HOT HEMOSTASIS N/A 02/27/2018   Procedure: HOT HEMOSTASIS (ARGON PLASMA COAGULATION/BICAP);  Surgeon: Carol Ada, MD;  Location: [facility];  Service: Endoscopy;  Laterality: N/A;   HOT HEMOSTASIS N/A 08/01/2018   Procedure: HOT HEMOSTASIS (ARGON PLASMA COAGULATION/BICAP);  Surgeon: Carol Ada, MD;  Location: [facility];  Service: Endoscopy;  Laterality: N/A;   HOT HEMOSTASIS N/A 06/29/2020   Procedure: HOT HEMOSTASIS (ARGON PLASMA COAGULATION/BICAP);  Surgeon: Carol Ada, MD;  Location: [facility];  Service: Endoscopy;  Laterality: N/A;   HOT HEMOSTASIS N/A 01/05/2021   Procedure: HOT HEMOSTASIS (ARGON PLASMA COAGULATION/BICAP);  Surgeon: Carol Ada, MD;  Location: [facility];  Service: Endoscopy;  Laterality: N/A;   IR RADIOLOGIST EVAL & MGMT  12/14/2016   IR RADIOLOGIST EVAL & MGMT  07/26/2021   LEFT HEART CATH AND CORS/GRAFTS ANGIOGRAPHY N/A 01/09/2018   Procedure: LEFT HEART CATH AND CORS/GRAFTS ANGIOGRAPHY;  Surgeon: Charolette Forward, MD;  Location: [facility];  Service: Cardiovascular;  Laterality: N/A;   LEFT HEART CATHETERIZATION WITH CORONARY ANGIOGRAM N/A 08/03/2014   Procedure: LEFT HEART CATHETERIZATION WITH CORONARY ANGIOGRAM;  Surgeon: Birdie Riddle, MD;  Location: [place] [facility];  Service: Cardiovascular;  Laterality: N/A;   Post Coronary Artery  BPG   01/05/2000   Right jugular sheath removed   PR VEIN BYPASS GRAFT,AORTO-FEM-POP     ROTATOR CUFF REPAIR     Right    REVIEW OF SYSTEMS:   Review of Systems  Constitutional: Positive for fatigue. Negative for appetite change, chills, fever and unexpected weight change.  HENT: Negative for mouth sores, nosebleeds, sore throat and trouble swallowing.   Eyes: Negative for eye problems and icterus.  Respiratory: Positive for dyspnea on exertion, occasional wheezing, and cough. Negative for hemoptysis. Cardiovascular: Negative for chest pain and leg swelling.  Gastrointestinal: positive for occasional dark stools. Positive for occasional unsettled feeling in stomach. Negative for constipation, diarrhea, nausea and vomiting.  Genitourinary: Negative for bladder incontinence, difficulty urinating, dysuria, frequency and hematuria.   Musculoskeletal: Positive for chronic back pain. Negative for gait problem, neck pain and neck stiffness.  Skin: Negative for itching and rash.  Neurological: Negative for dizziness, extremity weakness, gait problem, headaches, light-headedness and seizures.  Hematological: Negative for adenopathy. Does not bruise/bleed easily.  Psychiatric/Behavioral: Negative for confusion, depression and sleep disturbance. The patient is not nervous/anxious.     PHYSICAL EXAMINATION:  Blood pressure (!) 141/56, pulse (!) 58, temperature (!) 97.5 F (36.4 C), resp. rate 18, weight 153 lb 3 oz (69.5 kg), SpO2 100 %.  ECOG PERFORMANCE STATUS: 1  Physical Exam  Constitutional: Oriented to person, place, and time and elderly appearing female, and in no distress.  HENT:  Head: Normocephalic and atraumatic.  Mouth/Throat: Oropharynx is clear and moist. No oropharyngeal exudate.  Eyes: Conjunctivae are normal. Right eye exhibits no discharge. Left eye exhibits no discharge. No scleral icterus.  Neck: Normal range of motion. Neck supple.  Cardiovascular: Normal rate, regular rhythm,  normal heart sounds and intact distal pulses.   Pulmonary/Chest: Effort normal. Quiet breath sounds bilaterally. No respiratory distress. No wheezes. No rales.  Abdominal: Soft. Bowel sounds are normal. Exhibits no distension and no mass. There is no tenderness.  Musculoskeletal: Normal range of motion. Exhibits no edema.  Lymphadenopathy:    No cervical adenopathy.  Neurological: Alert and oriented to person, place, and time. Exhibits muscle wasting. Gait normal. Coordination normal.  Skin: Skin is warm and dry. No rash noted. Not diaphoretic. No erythema. No pallor.  Psychiatric: Mood, memory and judgment normal.  Vitals reviewed.  LABORATORY DATA: Lab Results  Component Value Date   WBC 7.6 08/31/2021   HGB 8.2 (L) 08/31/2021   HCT 26.8 (L) 08/31/2021   MCV 86.2 08/31/2021   PLT 311 08/31/2021      Chemistry      Component Value Date/Time   NA 138 08/31/2021 1046   NA 139 08/02/2017 0821   K 4.8 08/31/2021 1046   K 4.3 08/02/2017 0821   CL 104 08/31/2021 1046   CO2 27 08/31/2021 1046   CO2 24 08/02/2017 0821   BUN 29 (H) 08/31/2021 1046   BUN 14.4 08/02/2017 0821   CREATININE 1.75 (H) 08/31/2021 1046   CREATININE 1.3 (H) 08/02/2017 0821      Component Value Date/Time   CALCIUM 9.6 08/31/2021 1046   CALCIUM 9.2 08/02/2017 0821   ALKPHOS 73 08/31/2021 1046   ALKPHOS 66 08/02/2017 0821   AST 14 (L) 08/31/2021 1046   AST 14 08/02/2017 0821   ALT 7 08/31/2021 1046   ALT 8 08/02/2017 0821   BILITOT 0.3 08/31/2021 1046   BILITOT 0.34 08/02/2017 0821       RADIOGRAPHIC STUDIES:  CT Super D Chest Wo Contrast  Result Date: 08/23/2021 CLINICAL DATA:  Lung cancer. EXAM: CT CHEST WITHOUT CONTRAST TECHNIQUE: Multidetector CT imaging of the chest was performed using thin slice collimation for electromagnetic bronchoscopy planning purposes, without intravenous contrast. RADIATION DOSE REDUCTION: This exam was performed according to the departmental dose-optimization program  which includes automated exposure control, adjustment of the mA and/or kV according to patient size and/or use of iterative reconstruction technique. COMPARISON:  02/27/2021, 07/02/2020, 02/26/2020 and 08/25/2018. FINDINGS: Cardiovascular: Atherosclerotic calcification of the aorta and aortic valve. Heart size normal. No pericardial effusion. Mediastinum/Nodes: No pathologically enlarged mediastinal  or axillary lymph nodes. Hilar regions are difficult to definitively evaluate without IV contrast. Esophagus is grossly unremarkable. Lungs/Pleura: Biapical pleuroparenchymal scarring. Dystrophic calcification with minimal surrounding ground-glass density appears similar to 08/25/2018. Similar nodular consolidation in the posterior right lower lobe (7/84). Subpleural scarring in both lower lobes. No pleural fluid. Airway is unremarkable. Upper Abdomen: 13 mm well-circumscribed low-density lesion in the left hepatic lobe is too small to definitively characterize but is unchanged and likely a benign lesion such as a cyst. Cholecystectomy. Adrenal glands are unremarkable. Possible punctate stone in the lower pole right kidney. Visualized portions of the kidneys, spleen, pancreas, stomach and bowel are otherwise unremarkable. Musculoskeletal: Degenerative changes in the spine. Old right posterior rib fracture. No worrisome lytic or sclerotic lesions. IMPRESSION: 1. Stable nodular post treatment consolidation in the right lower lobe. No evidence of disease progression. 2. Dystrophic calcification with minimal surrounding ground-glass in the anterior segment right upper lobe, grossly similar to 08/25/2018. Renal stone. 3. Possible punctate right 4.  Aortic atherosclerosis (ICD10-I70.0). Electronically Signed   By: Lorin Picket M.D.   On: 08/23/2021 09:53     ASSESSMENT/PLAN:  This is a very pleasant 81 year old Caucasian female diagnosed with stage Ia non-small cell lung cancer, squamous cell carcinoma.  She initially  presented with a right lower lobe pulmonary nodule in 2017.   She is status post SBRT to this lesion under the care of Dr. Tammi Klippel which was completed in 2017.  The patient has been on observation since that time and she is feeling well.  The patient recently had a restaging CT scan performed.  Dr. Julien Nordmann personally and independently reviewed the scan and discussed the results with the patient today.  The scan showed no evidence of disease progression except for an irregular 1.4 cm Solid anterior  right upper lobe pulmonary nodule with heterogeneous internal calcification, this has been growing slowly over the course of multiple CT scans dating back to 2017 but is stable since her last scan 6 months ago.     Dr. Julien Nordmann recommends that she continue on observation with a repeat CT scan in 6 months.  If this area continues to grow, Dr. Julien Nordmann discussed the options would include biopsy versus SBRT. The patient is established with pulmonology if needed to consider sampling this area.    I will arrange for restaging CT scan of the chest in 6 months.  I will order this without contrast due to her CKD.  The patient had a lot of questions about the incidental findings on her scan.  The scan noted a punctate area of a possible kidney stone.  Discussed with the patient that this will likely pass on its own but to be aware in case she develops any symptoms of nephrolithiasis such as flank pain, hematuria, changes in urination, etc.  The patient is endorsing worsening shortness of breath with exertion.  She has a history of bleeding AVMs.  She follows with Dr. Benson Norway.  Apparently she has been having dark stools intermittently and has not sought out medical evaluation.  Her hemoglobin is 8.2 today which has dropped since her last lab draw on 02/27/21 in which her hemoglobin was 11.3 at that time.  The patient has required IV iron in the past the most recent being in November 2021.  We have arranged for the patient to  have iron studies drawn today which show iron deficiency anemia.  The patient was advised to call Dr. Ulyses Amor office for further evaluation.  We will  fax the labs over to his office.  We will arrange for her to receive IV iron at the [facility] infusion clinic.  We will arrange for her to receive 300 mg of Venofer weekly x3.   We will recheck her iron at her next lab draw.  The patient was advised to call immediately if she has any concerning symptoms in the interval. The patient voices understanding of current disease status and treatment options and is in agreement with the current care plan. All questions were answered. The patient knows to call the clinic with any problems, questions or concerns. We can certainly see the patient much sooner if necessary    Orders Placed This Encounter  Procedures   CBC with Differential ([facility] Only)    Standing Status:   Future    Standing Expiration Date:   08/31/2022   CMP ([facility] only)    Standing Status:   Future    Standing Expiration Date:   08/31/2022   Iron and Iron Binding Capacity ([facility] only)    Standing Status:   Future    Standing Expiration Date:   08/31/2022   Ferritin    Standing Status:   Future    Standing Expiration Date:   08/31/2022      Emori Kamau L Benisha Hadaway, PA-C 08/31/21  ADDENDUM: Hematology/Oncology Attending: I had a face-to-face encounter with the patient today.  I reviewed her record, lab and scan and recommended her care plan.  This is a very pleasant 81 years old [place] female with history of stage Ia non-small cell lung cancer, squamous cell carcinoma diagnosed in 2017 with right lower lobe pulmonary nodule status post SBRT and she has been in observation since that time.  The patient was found on previous CT scan of the chest in July 2022 for regular 1.4 cm soft solid anterior basilar right upper lobe pulmonary nodule concerning for slowly growing indolent metachronous primary bronchogenic  carcinoma.  The patient was referred to Dr. Valeta Harms for evaluation.  Repeat CT super D of the chest showed no evidence for disease progression and the patient had a stable groundglass opacity. I recommended for the patient to continue on observation with repeat CT scan of the chest and 6 months. Regarding her persistent iron deficiency anemia secondary to gastrointestinal blood loss.  We will repeat her iron studies today and if the patient has significant iron deficiency, will arrange for her to receive iron infusion.  She was also advised to see Dr. Benson Norway for gastrointestinal evaluation and repeat colonoscopy if needed. The patient was advised to call immediately if she has any other concerning symptoms in the interval. The total time spent in the appointment was 30 minutes. Disclaimer: This note was dictated with voice recognition software. Similar sounding words can inadvertently be transcribed and may be missed upon review. Eilleen Kempf, MD 08/31/21  .

## 2021-08-30 ENCOUNTER — Encounter: Payer: Self-pay | Admitting: Pulmonary Disease

## 2021-08-31 ENCOUNTER — Inpatient Hospital Stay: Payer: Medicare Other

## 2021-08-31 ENCOUNTER — Encounter: Payer: Self-pay | Admitting: Physician Assistant

## 2021-08-31 ENCOUNTER — Other Ambulatory Visit: Payer: Self-pay

## 2021-08-31 ENCOUNTER — Telehealth: Payer: Self-pay | Admitting: Pharmacy Technician

## 2021-08-31 ENCOUNTER — Inpatient Hospital Stay: Payer: Medicare Other | Attending: Physician Assistant

## 2021-08-31 ENCOUNTER — Inpatient Hospital Stay (HOSPITAL_BASED_OUTPATIENT_CLINIC_OR_DEPARTMENT_OTHER): Payer: Medicare Other | Admitting: Physician Assistant

## 2021-08-31 VITALS — BP 141/56 | HR 58 | Temp 97.5°F | Resp 18 | Wt 153.2 lb

## 2021-08-31 DIAGNOSIS — D509 Iron deficiency anemia, unspecified: Secondary | ICD-10-CM | POA: Insufficient documentation

## 2021-08-31 DIAGNOSIS — D631 Anemia in chronic kidney disease: Secondary | ICD-10-CM | POA: Insufficient documentation

## 2021-08-31 DIAGNOSIS — C3431 Malignant neoplasm of lower lobe, right bronchus or lung: Secondary | ICD-10-CM | POA: Diagnosis present

## 2021-08-31 DIAGNOSIS — Z90710 Acquired absence of both cervix and uterus: Secondary | ICD-10-CM | POA: Diagnosis not present

## 2021-08-31 DIAGNOSIS — D500 Iron deficiency anemia secondary to blood loss (chronic): Secondary | ICD-10-CM

## 2021-08-31 DIAGNOSIS — N189 Chronic kidney disease, unspecified: Secondary | ICD-10-CM | POA: Insufficient documentation

## 2021-08-31 LAB — CBC WITH DIFFERENTIAL (CANCER CENTER ONLY)
Abs Immature Granulocytes: 0.01 10*3/uL (ref 0.00–0.07)
Basophils Absolute: 0 10*3/uL (ref 0.0–0.1)
Basophils Relative: 1 %
Eosinophils Absolute: 0.4 10*3/uL (ref 0.0–0.5)
Eosinophils Relative: 6 %
HCT: 26.8 % — ABNORMAL LOW (ref 36.0–46.0)
Hemoglobin: 8.2 g/dL — ABNORMAL LOW (ref 12.0–15.0)
Immature Granulocytes: 0 %
Lymphocytes Relative: 34 %
Lymphs Abs: 2.6 10*3/uL (ref 0.7–4.0)
MCH: 26.4 pg (ref 26.0–34.0)
MCHC: 30.6 g/dL (ref 30.0–36.0)
MCV: 86.2 fL (ref 80.0–100.0)
Monocytes Absolute: 0.8 10*3/uL (ref 0.1–1.0)
Monocytes Relative: 10 %
Neutro Abs: 3.8 10*3/uL (ref 1.7–7.7)
Neutrophils Relative %: 49 %
Platelet Count: 311 10*3/uL (ref 150–400)
RBC: 3.11 MIL/uL — ABNORMAL LOW (ref 3.87–5.11)
RDW: 14.1 % (ref 11.5–15.5)
WBC Count: 7.6 10*3/uL (ref 4.0–10.5)
nRBC: 0 % (ref 0.0–0.2)

## 2021-08-31 LAB — CMP (CANCER CENTER ONLY)
ALT: 7 U/L (ref 0–44)
AST: 14 U/L — ABNORMAL LOW (ref 15–41)
Albumin: 4.1 g/dL (ref 3.5–5.0)
Alkaline Phosphatase: 73 U/L (ref 38–126)
Anion gap: 7 (ref 5–15)
BUN: 29 mg/dL — ABNORMAL HIGH (ref 8–23)
CO2: 27 mmol/L (ref 22–32)
Calcium: 9.6 mg/dL (ref 8.9–10.3)
Chloride: 104 mmol/L (ref 98–111)
Creatinine: 1.75 mg/dL — ABNORMAL HIGH (ref 0.44–1.00)
GFR, Estimated: 29 mL/min — ABNORMAL LOW (ref >60)
Glucose, Bld: 104 mg/dL — ABNORMAL HIGH (ref 70–99)
Potassium: 4.8 mmol/L (ref 3.5–5.1)
Sodium: 138 mmol/L (ref 135–145)
Total Bilirubin: 0.3 mg/dL (ref 0.3–1.2)
Total Protein: 7.6 g/dL (ref 6.5–8.1)

## 2021-08-31 LAB — IRON AND IRON BINDING CAPACITY (CC-WL,HP ONLY)
Iron: 22 ug/dL — ABNORMAL LOW (ref 28–170)
Saturation Ratios: 5 % — ABNORMAL LOW (ref 10.4–31.8)
TIBC: 489 ug/dL — ABNORMAL HIGH (ref 250–450)
UIBC: 467 ug/dL — ABNORMAL HIGH (ref 148–442)

## 2021-08-31 LAB — FERRITIN: Ferritin: 8 ng/mL — ABNORMAL LOW (ref 11–307)

## 2021-08-31 NOTE — Telephone Encounter (Signed)
Dr. Toya Smothers,  Juluis Rainier note: Auth Submission: no auth needed Payer: [employer] MEDICARE Medication & CPT/J Code(s) submitted: Venofer (Iron Sucrose) J1756 Route of submission (phone, fax, portal): PHONE Auth type: Buy/Bill Units/visits requested:  Reference number:  Approval from: 08/31/21 to 11/29/21  Patient will be scheduled as soon as possible.

## 2021-09-01 ENCOUNTER — Encounter: Payer: Self-pay | Admitting: Internal Medicine

## 2021-09-01 ENCOUNTER — Encounter: Payer: Self-pay | Admitting: Physician Assistant

## 2021-09-04 ENCOUNTER — Other Ambulatory Visit: Payer: Self-pay

## 2021-09-04 DIAGNOSIS — I771 Stricture of artery: Secondary | ICD-10-CM

## 2021-09-04 DIAGNOSIS — I6523 Occlusion and stenosis of bilateral carotid arteries: Secondary | ICD-10-CM

## 2021-09-06 ENCOUNTER — Other Ambulatory Visit: Payer: Self-pay

## 2021-09-06 ENCOUNTER — Encounter: Payer: Self-pay | Admitting: Internal Medicine

## 2021-09-06 ENCOUNTER — Encounter: Payer: Self-pay | Admitting: Physician Assistant

## 2021-09-06 ENCOUNTER — Ambulatory Visit (INDEPENDENT_AMBULATORY_CARE_PROVIDER_SITE_OTHER): Payer: Medicare Other

## 2021-09-06 VITALS — BP 172/49 | HR 48 | Temp 97.9°F | Resp 20 | Ht 62.0 in | Wt 154.6 lb

## 2021-09-06 DIAGNOSIS — D500 Iron deficiency anemia secondary to blood loss (chronic): Secondary | ICD-10-CM | POA: Diagnosis not present

## 2021-09-06 DIAGNOSIS — C3431 Malignant neoplasm of lower lobe, right bronchus or lung: Secondary | ICD-10-CM

## 2021-09-06 DIAGNOSIS — D509 Iron deficiency anemia, unspecified: Secondary | ICD-10-CM

## 2021-09-06 MED ORDER — ACETAMINOPHEN 325 MG PO TABS
650.0000 mg | ORAL_TABLET | Freq: Once | ORAL | Status: AC
  Administered 2021-09-06: 10:00:00 650 mg via ORAL
  Filled 2021-09-06: qty 2

## 2021-09-06 MED ORDER — METHYLPREDNISOLONE SODIUM SUCC 125 MG IJ SOLR: 125.0000 mg | Freq: Once | INTRAMUSCULAR | Status: DC | PRN

## 2021-09-06 MED ORDER — DIPHENHYDRAMINE HCL 25 MG PO CAPS
25.0000 mg | ORAL_CAPSULE | Freq: Once | ORAL | Status: AC
  Administered 2021-09-06: 10:00:00 25 mg via ORAL
  Filled 2021-09-06: qty 1

## 2021-09-06 MED ORDER — FAMOTIDINE IN NACL 20-0.9 MG/50ML-% IV SOLN: 20.0000 mg | Freq: Once | INTRAVENOUS | Status: DC | PRN

## 2021-09-06 MED ORDER — SODIUM CHLORIDE 0.9 % IV SOLN
200.0000 mg | Freq: Once | INTRAVENOUS | Status: AC
  Administered 2021-09-06: 11:00:00 200 mg via INTRAVENOUS
  Filled 2021-09-06: qty 10

## 2021-09-06 MED ORDER — SODIUM CHLORIDE 0.9 % IV SOLN: Freq: Once | INTRAVENOUS | Status: DC | PRN

## 2021-09-06 MED ORDER — DIPHENHYDRAMINE HCL 50 MG/ML IJ SOLN: 50.0000 mg | Freq: Once | INTRAMUSCULAR | Status: DC | PRN

## 2021-09-06 MED ORDER — EPINEPHRINE 0.3 MG/0.3ML IJ SOAJ: 0.3000 mg | Freq: Once | INTRAMUSCULAR | Status: DC | PRN

## 2021-09-06 MED ORDER — ALBUTEROL SULFATE HFA 108 (90 BASE) MCG/ACT IN AERS: 2.0000 | INHALATION_SPRAY | Freq: Once | RESPIRATORY_TRACT | Status: DC | PRN

## 2021-09-06 NOTE — Progress Notes (Signed)
Diagnosis: Iron Deficiency Anemia  Provider:  Marshell Garfinkel, MD  Procedure: Infusion  IV Type: Peripheral, IV Location: L Antecubital  Venofer (Iron Sucrose), Dose: 200 mg  Infusion Start Time: 4734  Infusion Stop Time: 1100  Post Infusion IV Care: Observation period completed and Peripheral IV Discontinued  Discharge: Condition: Good, Destination: Home . AVS provided to patient.   Performed by:  Koren Shiver, RN

## 2021-09-13 ENCOUNTER — Other Ambulatory Visit: Payer: Self-pay

## 2021-09-13 ENCOUNTER — Ambulatory Visit (INDEPENDENT_AMBULATORY_CARE_PROVIDER_SITE_OTHER): Payer: Medicare Other

## 2021-09-13 VITALS — BP 156/60 | HR 52 | Temp 98.3°F | Resp 20 | Ht 62.0 in | Wt 153.2 lb

## 2021-09-13 DIAGNOSIS — D500 Iron deficiency anemia secondary to blood loss (chronic): Secondary | ICD-10-CM

## 2021-09-13 DIAGNOSIS — D509 Iron deficiency anemia, unspecified: Secondary | ICD-10-CM

## 2021-09-13 MED ORDER — ACETAMINOPHEN 325 MG PO TABS
650.0000 mg | ORAL_TABLET | Freq: Once | ORAL | Status: AC
  Administered 2021-09-13: 650 mg via ORAL
  Filled 2021-09-13: qty 2

## 2021-09-13 MED ORDER — SODIUM CHLORIDE 0.9 % IV SOLN: Freq: Once | INTRAVENOUS | Status: DC | PRN

## 2021-09-13 MED ORDER — FAMOTIDINE IN NACL 20-0.9 MG/50ML-% IV SOLN: 20.0000 mg | Freq: Once | INTRAVENOUS | Status: DC | PRN

## 2021-09-13 MED ORDER — ALBUTEROL SULFATE HFA 108 (90 BASE) MCG/ACT IN AERS: 2.0000 | INHALATION_SPRAY | Freq: Once | RESPIRATORY_TRACT | Status: DC | PRN

## 2021-09-13 MED ORDER — METHYLPREDNISOLONE SODIUM SUCC 125 MG IJ SOLR: 125.0000 mg | Freq: Once | INTRAMUSCULAR | Status: DC | PRN

## 2021-09-13 MED ORDER — EPINEPHRINE 0.3 MG/0.3ML IJ SOAJ: 0.3000 mg | Freq: Once | INTRAMUSCULAR | Status: DC | PRN

## 2021-09-13 MED ORDER — SODIUM CHLORIDE 0.9 % IV SOLN
200.0000 mg | Freq: Once | INTRAVENOUS | Status: AC
  Administered 2021-09-13: 200 mg via INTRAVENOUS
  Filled 2021-09-13: qty 10

## 2021-09-13 MED ORDER — DIPHENHYDRAMINE HCL 50 MG/ML IJ SOLN: 50.0000 mg | Freq: Once | INTRAMUSCULAR | Status: DC | PRN

## 2021-09-13 MED ORDER — DIPHENHYDRAMINE HCL 25 MG PO CAPS
25.0000 mg | ORAL_CAPSULE | Freq: Once | ORAL | Status: AC
  Administered 2021-09-13: 25 mg via ORAL
  Filled 2021-09-13: qty 1

## 2021-09-13 NOTE — Progress Notes (Signed)
Diagnosis: Iron Deficiency Anemia  Provider:  Marshell Garfinkel, MD  Procedure: Infusion  IV Type: Peripheral, IV Location: L Antecubital  Venofer (Iron Sucrose), Dose: 200 mg  Infusion Start Time: 3953  Infusion Stop Time: 1100  Post Infusion IV Care: Peripheral IV Discontinued  Discharge: Condition: Good, Destination: Home . AVS provided to patient.   Performed by:  Koren Shiver, RN

## 2021-09-14 ENCOUNTER — Ambulatory Visit (INDEPENDENT_AMBULATORY_CARE_PROVIDER_SITE_OTHER)
Admission: RE | Admit: 2021-09-14 | Discharge: 2021-09-14 | Disposition: A | Discharge status: Home / Self Care | Payer: Medicare Other | Source: Ambulatory Visit | Attending: Vascular Surgery | Admitting: Vascular Surgery

## 2021-09-14 ENCOUNTER — Ambulatory Visit (INDEPENDENT_AMBULATORY_CARE_PROVIDER_SITE_OTHER): Payer: Medicare Other | Admitting: Vascular Surgery

## 2021-09-14 ENCOUNTER — Ambulatory Visit (HOSPITAL_COMMUNITY)
Admission: RE | Admit: 2021-09-14 | Discharge: 2021-09-14 | Disposition: A | Discharge status: Home / Self Care | Payer: Medicare Other | Source: Ambulatory Visit | Attending: Vascular Surgery | Admitting: Vascular Surgery

## 2021-09-14 ENCOUNTER — Encounter: Payer: Self-pay | Admitting: Vascular Surgery

## 2021-09-14 ENCOUNTER — Other Ambulatory Visit: Payer: Self-pay | Admitting: Gastroenterology

## 2021-09-14 VITALS — BP 172/69 | HR 57 | Temp 97.7°F | Resp 20 | Ht 62.0 in | Wt 157.0 lb

## 2021-09-14 DIAGNOSIS — I6523 Occlusion and stenosis of bilateral carotid arteries: Secondary | ICD-10-CM | POA: Diagnosis present

## 2021-09-14 DIAGNOSIS — Z9889 Other specified postprocedural states: Secondary | ICD-10-CM

## 2021-09-14 DIAGNOSIS — I771 Stricture of artery: Secondary | ICD-10-CM

## 2021-09-14 NOTE — H&P (View-Only) (Signed)
REASON FOR VISIT:   Follow-up of peripheral arterial disease and carotid disease.  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  This puts her at increased risk for stroke and I have recommended right carotid endarterectomy.  She has had no recent chest pain or recent cardiac symptoms.  She does have a history of anemia and is scheduled for an iron injection next week.  Her last hemoglobin was 8.1.  I think we could proceed with carotid endarterectomy on 09/26/2020 after her iron injection if there is some improvement in her hemoglobin. I have reviewed the indications for carotid endarterectomy, that is to lower the risk of future stroke. I have also reviewed the potential complications of surgery, including but not limited to: bleeding, stroke (perioperative risk 1-2%), MI, nerve injury of other unpredictable medical problems. All of the patients questions were answered and they are agreeable to proceed with surgery.  She is on Plavix and is on a statin.  PERIPHERAL ARTERIAL DISEASE: The patient has mild multilevel arterial occlusive disease with stable claudication.  She will need follow-up ABIs in 1 year.  I have encouraged her to stay as active as possible.  Fortunately she is not a smoker.  She is on a statin.   HPI:   [name] is a pleasant 81 y.o. female who I performed a left carotid endarterectomy on 2011.  I been following a moderate right carotid stenosis.  Since I saw her last, she denies any history of stroke, TIAs, expressive or receptive aphasia, or amaurosis fugax.  She is on Plavix but is not on aspirin.  This may be because of her history of anemia.  She is also on a statin.  She quit smoking in 2002.  She also has a history of peripheral arterial disease.  She has stable claudication of both legs with no rest pain or nonhealing ulcers.  She has a history of anemia and is scheduled  for an iron injection next Tuesday.  She does have a history of previous coronary revascularization but has had no recent chest pain or chest pressure.  She has some mild dyspnea on exertion related to her COPD.  She quit smoking in 2002.  She had a left lower lobe pneumonia treated with radiation therapy and is due for a follow-up study in 6 months.   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   Diverticulitis    Dyspnea    with exertion   Gastroesophageal reflux disease    occ   Glaucoma    right eye   Headache    tension   Headache syndrome 11/27/2018   Hiatal hernia    Hyperlipidemia    Hypertension    Iliac artery aneurysm (HCC)    Lung cancer ([place]) dx 2018   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   Myocardial infarction ([place]) 01/01/2000   Cardiac catheterization   Peripheral vascular disease ([place])  stents in legs x 2 or 3   Pneumonia    last time winter 2017 -2018   PONV (postoperative nausea and vomiting)    occassionally, last colonscopy did ok with anesthesia   Reflux    Wears dentures    Full set   Wears glasses     Family History  Problem Relation Age of Onset   Heart disease Mother        Heart Disease before age 10   Hypertension Mother    Hyperlipidemia Mother    Heart attack Mother    Clotting disorder Mother    Heart disease Father        Heart Disease before age 28   Heart attack Father    Hyperlipidemia Father    Hypertension Father    Heart disease Brother        Heart Disease before age 22   Hyperlipidemia Brother    Hypertension Brother     Clotting disorder Brother    AAA (abdominal aortic aneurysm) Brother    Cerebral aneurysm Sister    Hypertension Sister    AAA (abdominal aortic aneurysm) Sister    Asthma Sister    Cerebral aneurysm Brother    Cancer Brother        Lung   Hypertension Brother    Heart attack Brother    Heart disease Brother        Aneurysm of Brain   Hypertension Brother    Heart disease Brother    Heart disease Brother    Stroke Son        Aneurysm of Stomach   AAA (abdominal aortic aneurysm) Son    Cancer Maternal Uncle        great uncle/cancer/type unknown    SOCIAL HISTORY: Social History   Tobacco Use   Smoking status: Former    Packs/day: 1.50    Years: 30.00    Pack years: 45.00    Types: Cigarettes    Quit date: 08/13/2000    Years since quitting: 21.1   Smokeless tobacco: Never  Substance Use Topics   Alcohol use: No    Alcohol/week: 0.0 standard drinks    Allergies  Allergen Reactions   Azithromycin Swelling    Patient reported past history of lip swelling   Codeine Other (See Comments)    Dr. Terrence Dupont advised patient not to take this medication   Doxycycline Swelling    Mouth, lips, feet swelling   Hydromorphone Palpitations and Other (See Comments)    DILAUDID  -  Pt had a Heart Attack after taking Dilaudid.   Levaquin [Levofloxacin] Other (See Comments)    Chest pressure, SOB, "pain in between shoulder blades", sweaty -as reported by patient per experience in ED this afternoon   Vitamin D Analogs Swelling   Avelox [Moxifloxacin Hcl In Nacl] Palpitations    Caused Heart Attack    Oxycodone-Acetaminophen Other (See Comments)    Says it makes her feel weird   Risedronate Other (See Comments)    Chest pain    Current Outpatient Medications  Medication Sig Dispense Refill   acetaminophen (TYLENOL) 500 MG tablet Take 500-1,000 mg by mouth every 6 (six) hours as needed (pain).     albuterol (VENTOLIN HFA) 108 (90 Base) MCG/ACT inhaler Inhale 2 puffs into the  lungs every 6 (six) hours as needed for wheezing or shortness of breath. 8 g 6   cetirizine (ZYRTEC) 10 MG tablet Take 10 mg by mouth daily as needed for  allergies.      clopidogrel (PLAVIX) 75 MG tablet Take 1 tablet (75 mg total) by mouth daily. (Patient taking differently: Take 75 mg by mouth in the morning.) 90 tablet 3   dexlansoprazole (DEXILANT) 60 MG capsule Take 1 capsule (60 mg total) by mouth daily before breakfast. 30 capsule 11   Fluticasone-Umeclidin-Vilant (TRELEGY ELLIPTA) 200-62.5-25 MCG/ACT AEPB Inhale 1 puff into the lungs daily. 180 each 3   furosemide (LASIX) 40 MG tablet Take 40 mg by mouth daily.     magnesium oxide (MAG-OX) 400 (241.3 Mg) MG tablet Take 400 mg by mouth in the morning.     NIFEdipine (PROCARDIA-XL/NIFEDICAL-XL) 30 MG 24 hr tablet Take 30 mg by mouth in the morning.     nitroGLYCERIN (NITROSTAT) 0.4 MG SL tablet Place 0.4 mg under the tongue every 5 (five) minutes x 3 doses as needed for chest pain.     olmesartan (BENICAR) 20 MG tablet Take 1 tablet (20 mg total) by mouth daily. (Patient taking differently: Take 20 mg by mouth in the morning.) 90 tablet 1   ondansetron (ZOFRAN-ODT) 4 MG disintegrating tablet Take by mouth.     polyethylene glycol powder (MIRALAX) 17 GM/SCOOP powder Take 17 g by mouth 2 (two) times daily as needed for moderate constipation. 255 g 1   rosuvastatin (CRESTOR) 10 MG tablet Take 10 mg by mouth at bedtime.      valACYclovir (VALTREX) 1000 MG tablet Take 1,000 mg by mouth 2 (two) times daily.     No current facility-administered medications for this visit.   Facility-Administered Medications Ordered in Other Visits  Medication Dose Route Frequency Provider Last Rate Last Admin   promethazine (PHENERGAN) injection 6.25-12.5 mg  6.25-12.5 mg Intravenous Q15 min PRN Brennan Bailey, MD        REVIEW OF SYSTEMS:  [X]  denotes positive finding, [ ]  denotes negative finding Cardiac  Comments:  Chest pain or chest pressure:     Shortness of breath upon exertion: x   Short of breath when lying flat:    Irregular heart rhythm:        Vascular    Pain in calf, thigh, or hip brought on by ambulation:    Pain in feet at night that wakes you up from your sleep:     Blood clot in your veins:    Leg swelling:         Pulmonary    Oxygen at home:    Productive cough:     Wheezing:         Neurologic    Sudden weakness in arms or legs:     Sudden numbness in arms or legs:     Sudden onset of difficulty speaking or slurred speech:    Temporary loss of vision in one eye:     Problems with dizziness:         Gastrointestinal    Blood in stool:     Vomited blood:         Genitourinary    Burning when urinating:     Blood in urine:        Psychiatric    Major depression:         Hematologic    Bleeding problems:    Problems with blood clotting too easily:        Skin    Rashes or ulcers:        Constitutional    Fever or chills:  PHYSICAL EXAM:   Vitals:   09/14/21 0905 09/14/21 0909  BP: (!) 156/49 (!) 172/69  Pulse: (!) 57   Resp: 20   Temp: 97.7 F (36.5 C)   SpO2: 95%   Weight: 157 lb (71.2 kg)   Height: 5\' 2"  (1.575 m)     GENERAL: The patient is a well-nourished female, in no acute distress. The vital signs are documented above. CARDIAC: There is a regular rate and rhythm.  VASCULAR: She has bilateral carotid bruits. She has femoral pulses bilaterally. She has diminished but palpable dorsalis pedis pulses bilaterally. PULMONARY: There is good air exchange bilaterally without wheezing or rales. ABDOMEN: Soft and non-tender with normal pitched bowel sounds.  MUSCULOSKELETAL: There are no major deformities or cyanosis. NEUROLOGIC: No focal weakness or paresthesias are detected. SKIN: There are no ulcers or rashes noted. PSYCHIATRIC: The patient has a normal affect.  DATA:    CAROTID DUPLEX: I have independently interpreted her carotid duplex scan today.  On the right side  there is a greater than 80% stenosis.  Peak systolic velocity is 242 cm/s with an end-diastolic velocity of 683 cm/s.  The stenosis is in the proximal ICA.  The ICA beyond the stenosis appears normal.  The anatomy is within normal limits.  The bifurcation is at the hyoid notch.  The right vertebral artery is patent with antegrade flow.  Her left carotid endarterectomy site is widely patent.  The left vertebral artery is patent with antegrade flow.  ARTERIAL DOPPLER STUDY: I have independently interpreted her arterial Doppler study today.  On the right side there is a biphasic dorsalis pedis and posterior tibial signal with an ABI of 80%.  Toe pressure is 92 mmHg.  On the left side there is a biphasic dorsalis pedis and posterior tibial signal.  ABIs 86%.  Toe pressures 88 mmHg.  ARTERIAL DUPLEX: I have independently interpreted her arterial duplex of the aortoiliac system.  There are some mildly elevated velocities in the proximal aorta, right common iliac artery, and left external iliac artery.    Deitra Mayo [facility] [phone]

## 2021-09-14 NOTE — Progress Notes (Signed)
REASON FOR VISIT:   Follow-up of peripheral arterial disease and carotid disease.  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  This puts her at increased risk for stroke and I have recommended right carotid endarterectomy.  She has had no recent chest pain or recent cardiac symptoms.  She does have a history of anemia and is scheduled for an iron injection next week.  Her last hemoglobin was 8.1.  I think we could proceed with carotid endarterectomy on 09/26/2020 after her iron injection if there is some improvement in her hemoglobin. I have reviewed the indications for carotid endarterectomy, that is to lower the risk of future stroke. I have also reviewed the potential complications of surgery, including but not limited to: bleeding, stroke (perioperative risk 1-2%), MI, nerve injury of other unpredictable medical problems. All of the patients questions were answered and they are agreeable to proceed with surgery.  She is on Plavix and is on a statin.  PERIPHERAL ARTERIAL DISEASE: The patient has mild multilevel arterial occlusive disease with stable claudication.  She will need follow-up ABIs in 1 year.  I have encouraged her to stay as active as possible.  Fortunately she is not a smoker.  She is on a statin.   HPI:   [name] is a pleasant 81 y.o. female who I performed a left carotid endarterectomy on 2011.  I been following a moderate right carotid stenosis.  Since I saw her last, she denies any history of stroke, TIAs, expressive or receptive aphasia, or amaurosis fugax.  She is on Plavix but is not on aspirin.  This may be because of her history of anemia.  She is also on a statin.  She quit smoking in 2002.  She also has a history of peripheral arterial disease.  She has stable claudication of both legs with no rest pain or nonhealing ulcers.  She has a history of anemia and is scheduled  for an iron injection next Tuesday.  She does have a history of previous coronary revascularization but has had no recent chest pain or chest pressure.  She has some mild dyspnea on exertion related to her COPD.  She quit smoking in 2002.  She had a left lower lobe pneumonia treated with radiation therapy and is due for a follow-up study in 6 months.   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   Diverticulitis    Dyspnea    with exertion   Gastroesophageal reflux disease    occ   Glaucoma    right eye   Headache    tension   Headache syndrome 11/27/2018   Hiatal hernia    Hyperlipidemia    Hypertension    Iliac artery aneurysm (HCC)    Lung cancer ([place]) dx 2018   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   Myocardial infarction ([place]) 01/01/2000   Cardiac catheterization   Peripheral vascular disease ([place])  stents in legs x 2 or 3   Pneumonia    last time winter 2017 -2018   PONV (postoperative nausea and vomiting)    occassionally, last colonscopy did ok with anesthesia   Reflux    Wears dentures    Full set   Wears glasses     Family History  Problem Relation Age of Onset   Heart disease Mother        Heart Disease before age 48   Hypertension Mother    Hyperlipidemia Mother    Heart attack Mother    Clotting disorder Mother    Heart disease Father        Heart Disease before age 51   Heart attack Father    Hyperlipidemia Father    Hypertension Father    Heart disease Brother        Heart Disease before age 58   Hyperlipidemia Brother    Hypertension Brother     Clotting disorder Brother    AAA (abdominal aortic aneurysm) Brother    Cerebral aneurysm Sister    Hypertension Sister    AAA (abdominal aortic aneurysm) Sister    Asthma Sister    Cerebral aneurysm Brother    Cancer Brother        Lung   Hypertension Brother    Heart attack Brother    Heart disease Brother        Aneurysm of Brain   Hypertension Brother    Heart disease Brother    Heart disease Brother    Stroke Son        Aneurysm of Stomach   AAA (abdominal aortic aneurysm) Son    Cancer Maternal Uncle        great uncle/cancer/type unknown    SOCIAL HISTORY: Social History   Tobacco Use   Smoking status: Former    Packs/day: 1.50    Years: 30.00    Pack years: 45.00    Types: Cigarettes    Quit date: 08/13/2000    Years since quitting: 21.1   Smokeless tobacco: Never  Substance Use Topics   Alcohol use: No    Alcohol/week: 0.0 standard drinks    Allergies  Allergen Reactions   Azithromycin Swelling    Patient reported past history of lip swelling   Codeine Other (See Comments)    Dr. Terrence Dupont advised patient not to take this medication   Doxycycline Swelling    Mouth, lips, feet swelling   Hydromorphone Palpitations and Other (See Comments)    DILAUDID  -  Pt had a Heart Attack after taking Dilaudid.   Levaquin [Levofloxacin] Other (See Comments)    Chest pressure, SOB, "pain in between shoulder blades", sweaty -as reported by patient per experience in ED this afternoon   Vitamin D Analogs Swelling   Avelox [Moxifloxacin Hcl In Nacl] Palpitations    Caused Heart Attack    Oxycodone-Acetaminophen Other (See Comments)    Says it makes her feel weird   Risedronate Other (See Comments)    Chest pain    Current Outpatient Medications  Medication Sig Dispense Refill   acetaminophen (TYLENOL) 500 MG tablet Take 500-1,000 mg by mouth every 6 (six) hours as needed (pain).     albuterol (VENTOLIN HFA) 108 (90 Base) MCG/ACT inhaler Inhale 2 puffs into the  lungs every 6 (six) hours as needed for wheezing or shortness of breath. 8 g 6   cetirizine (ZYRTEC) 10 MG tablet Take 10 mg by mouth daily as needed for  allergies.      clopidogrel (PLAVIX) 75 MG tablet Take 1 tablet (75 mg total) by mouth daily. (Patient taking differently: Take 75 mg by mouth in the morning.) 90 tablet 3   dexlansoprazole (DEXILANT) 60 MG capsule Take 1 capsule (60 mg total) by mouth daily before breakfast. 30 capsule 11   Fluticasone-Umeclidin-Vilant (TRELEGY ELLIPTA) 200-62.5-25 MCG/ACT AEPB Inhale 1 puff into the lungs daily. 180 each 3   furosemide (LASIX) 40 MG tablet Take 40 mg by mouth daily.     magnesium oxide (MAG-OX) 400 (241.3 Mg) MG tablet Take 400 mg by mouth in the morning.     NIFEdipine (PROCARDIA-XL/NIFEDICAL-XL) 30 MG 24 hr tablet Take 30 mg by mouth in the morning.     nitroGLYCERIN (NITROSTAT) 0.4 MG SL tablet Place 0.4 mg under the tongue every 5 (five) minutes x 3 doses as needed for chest pain.     olmesartan (BENICAR) 20 MG tablet Take 1 tablet (20 mg total) by mouth daily. (Patient taking differently: Take 20 mg by mouth in the morning.) 90 tablet 1   ondansetron (ZOFRAN-ODT) 4 MG disintegrating tablet Take by mouth.     polyethylene glycol powder (MIRALAX) 17 GM/SCOOP powder Take 17 g by mouth 2 (two) times daily as needed for moderate constipation. 255 g 1   rosuvastatin (CRESTOR) 10 MG tablet Take 10 mg by mouth at bedtime.      valACYclovir (VALTREX) 1000 MG tablet Take 1,000 mg by mouth 2 (two) times daily.     No current facility-administered medications for this visit.   Facility-Administered Medications Ordered in Other Visits  Medication Dose Route Frequency Provider Last Rate Last Admin   promethazine (PHENERGAN) injection 6.25-12.5 mg  6.25-12.5 mg Intravenous Q15 min PRN Brennan Bailey, MD        REVIEW OF SYSTEMS:  [X]  denotes positive finding, [ ]  denotes negative finding Cardiac  Comments:  Chest pain or chest pressure:     Shortness of breath upon exertion: x   Short of breath when lying flat:    Irregular heart rhythm:        Vascular    Pain in calf, thigh, or hip brought on by ambulation:    Pain in feet at night that wakes you up from your sleep:     Blood clot in your veins:    Leg swelling:         Pulmonary    Oxygen at home:    Productive cough:     Wheezing:         Neurologic    Sudden weakness in arms or legs:     Sudden numbness in arms or legs:     Sudden onset of difficulty speaking or slurred speech:    Temporary loss of vision in one eye:     Problems with dizziness:         Gastrointestinal    Blood in stool:     Vomited blood:         Genitourinary    Burning when urinating:     Blood in urine:        Psychiatric    Major depression:         Hematologic    Bleeding problems:    Problems with blood clotting too easily:        Skin    Rashes or ulcers:        Constitutional    Fever or chills:  PHYSICAL EXAM:   Vitals:   09/14/21 0905 09/14/21 0909  BP: (!) 156/49 (!) 172/69  Pulse: (!) 57   Resp: 20   Temp: 97.7 F (36.5 C)   SpO2: 95%   Weight: 157 lb (71.2 kg)   Height: 5\' 2"  (1.575 m)     GENERAL: The patient is a well-nourished female, in no acute distress. The vital signs are documented above. CARDIAC: There is a regular rate and rhythm.  VASCULAR: She has bilateral carotid bruits. She has femoral pulses bilaterally. She has diminished but palpable dorsalis pedis pulses bilaterally. PULMONARY: There is good air exchange bilaterally without wheezing or rales. ABDOMEN: Soft and non-tender with normal pitched bowel sounds.  MUSCULOSKELETAL: There are no major deformities or cyanosis. NEUROLOGIC: No focal weakness or paresthesias are detected. SKIN: There are no ulcers or rashes noted. PSYCHIATRIC: The patient has a normal affect.  DATA:    CAROTID DUPLEX: I have independently interpreted her carotid duplex scan today.  On the right side  there is a greater than 80% stenosis.  Peak systolic velocity is 224 cm/s with an end-diastolic velocity of 497 cm/s.  The stenosis is in the proximal ICA.  The ICA beyond the stenosis appears normal.  The anatomy is within normal limits.  The bifurcation is at the hyoid notch.  The right vertebral artery is patent with antegrade flow.  Her left carotid endarterectomy site is widely patent.  The left vertebral artery is patent with antegrade flow.  ARTERIAL DOPPLER STUDY: I have independently interpreted her arterial Doppler study today.  On the right side there is a biphasic dorsalis pedis and posterior tibial signal with an ABI of 80%.  Toe pressure is 92 mmHg.  On the left side there is a biphasic dorsalis pedis and posterior tibial signal.  ABIs 86%.  Toe pressures 88 mmHg.  ARTERIAL DUPLEX: I have independently interpreted her arterial duplex of the aortoiliac system.  There are some mildly elevated velocities in the proximal aorta, right common iliac artery, and left external iliac artery.    Deitra Mayo [facility] [phone]

## 2021-09-18 ENCOUNTER — Other Ambulatory Visit: Payer: Self-pay

## 2021-09-18 DIAGNOSIS — I6523 Occlusion and stenosis of bilateral carotid arteries: Secondary | ICD-10-CM

## 2021-09-20 ENCOUNTER — Ambulatory Visit (INDEPENDENT_AMBULATORY_CARE_PROVIDER_SITE_OTHER): Payer: Medicare Other

## 2021-09-20 ENCOUNTER — Encounter: Payer: Self-pay | Admitting: Physician Assistant

## 2021-09-20 ENCOUNTER — Other Ambulatory Visit: Payer: Self-pay

## 2021-09-20 ENCOUNTER — Encounter: Payer: Self-pay | Admitting: Internal Medicine

## 2021-09-20 VITALS — BP 162/64 | HR 57 | Temp 97.7°F | Resp 19 | Ht 62.0 in | Wt 153.6 lb

## 2021-09-20 DIAGNOSIS — D500 Iron deficiency anemia secondary to blood loss (chronic): Secondary | ICD-10-CM | POA: Diagnosis not present

## 2021-09-20 DIAGNOSIS — D509 Iron deficiency anemia, unspecified: Secondary | ICD-10-CM

## 2021-09-20 MED ORDER — SODIUM CHLORIDE 0.9 % IV SOLN: Freq: Once | INTRAVENOUS | Status: DC | PRN

## 2021-09-20 MED ORDER — SODIUM CHLORIDE 0.9 % IV SOLN
200.0000 mg | Freq: Once | INTRAVENOUS | Status: AC
  Administered 2021-09-20: 200 mg via INTRAVENOUS
  Filled 2021-09-20: qty 10

## 2021-09-20 MED ORDER — FAMOTIDINE IN NACL 20-0.9 MG/50ML-% IV SOLN: 20.0000 mg | Freq: Once | INTRAVENOUS | Status: DC | PRN

## 2021-09-20 MED ORDER — DIPHENHYDRAMINE HCL 25 MG PO CAPS
25.0000 mg | ORAL_CAPSULE | Freq: Once | ORAL | Status: AC
  Administered 2021-09-20: 25 mg via ORAL
  Filled 2021-09-20: qty 1

## 2021-09-20 MED ORDER — DIPHENHYDRAMINE HCL 50 MG/ML IJ SOLN: 50.0000 mg | Freq: Once | INTRAMUSCULAR | Status: DC | PRN

## 2021-09-20 MED ORDER — ACETAMINOPHEN 325 MG PO TABS
650.0000 mg | ORAL_TABLET | Freq: Once | ORAL | Status: AC
  Administered 2021-09-20: 650 mg via ORAL
  Filled 2021-09-20: qty 2

## 2021-09-20 MED ORDER — ALBUTEROL SULFATE HFA 108 (90 BASE) MCG/ACT IN AERS: 2.0000 | INHALATION_SPRAY | Freq: Once | RESPIRATORY_TRACT | Status: DC | PRN

## 2021-09-20 MED ORDER — METHYLPREDNISOLONE SODIUM SUCC 125 MG IJ SOLR: 125.0000 mg | Freq: Once | INTRAMUSCULAR | Status: DC | PRN

## 2021-09-20 MED ORDER — EPINEPHRINE 0.3 MG/0.3ML IJ SOAJ: 0.3000 mg | Freq: Once | INTRAMUSCULAR | Status: DC | PRN

## 2021-09-20 NOTE — Progress Notes (Signed)
Diagnosis: Iron Deficiency Anemia  Provider:  Marshell Garfinkel, MD  Procedure: Infusion  IV Type: Peripheral, IV Location: L Antecubital  Feraheme (Ferumoxytol), Venofer (Iron Sucrose), Dose: 200 mg  Infusion Start Time: 8280  Infusion Stop Time: 0349  Post Infusion IV Care: Peripheral IV Discontinued  Discharge: Condition: Good, Destination: Home . AVS provided to patient.   Performed by:  Cleophus Molt, RN

## 2021-09-21 NOTE — Progress Notes (Signed)
Surgical Instructions    Your procedure is scheduled on September 26, 2021.  Report to [facility] Main Entrance "A" at 7:30 A.M., then check in with the Admitting office.  Call this number if you have problems the morning of surgery:  [phone]   If you have any questions prior to your surgery date call [phone]: Open Monday-Friday 8am-4pm    Remember:  Do not eat or drink after midnight the night before your surgery    Take these medicines the morning of surgery with A SIP OF WATER:  Clopidogrel (Plavix) Dexlansoprazole (Dexilant) Fluticasone-Umeclidin-Vilant (Trelegy Ellipta) inhaler Nifedipine (Procardia-XL)  If needed: Acetaminophen (Tylenol) Albuterol (Ventolin HFA) inhaler  Please bring all inhalers with you the day of surgery.  Cetirizine (Zyrtec) Nitroglycerin (Nitrostat)--tell your nurse if you needed to use this Ondansetron (Zofran-ODT)   As of today, STOP taking any Aspirin (unless otherwise instructed by your surgeon) Aleve, Naproxen, Ibuprofen, Motrin, Advil, Goody's, BC's, all herbal medications, fish oil, and all vitamins.           Do not wear jewelry or makeup Do not wear lotions, powders, perfumes, or deodorant. Do not shave 48 hours prior to surgery.   Do not bring valuables to the hospital. Do not wear nail polish, gel polish, artificial nails, or any other type of covering on natural nails (fingers and toes) If you have artificial nails or gel coating that need to be removed by a nail salon, please have this removed prior to surgery. Artificial nails or gel coating may interfere with anesthesia's ability to adequately monitor your vital signs.  [facility] is not responsible for any belongings or valuables. .   Do NOT Smoke (Tobacco/Vaping)  24 hours prior to your procedure  If you use a CPAP at night, you may bring your mask for your overnight stay.   Contacts, glasses, hearing aids, dentures or partials may not be worn into surgery, please  bring cases for these belongings   For patients admitted to the hospital, discharge time will be determined by your treatment team.   Patients discharged the day of surgery will not be allowed to drive home, and someone needs to stay with them for 24 hours.  NO VISITORS WILL BE ALLOWED IN PRE-OP WHERE PATIENTS ARE PREPPED FOR SURGERY.  ONLY 1 SUPPORT PERSON MAY BE PRESENT IN THE WAITING ROOM WHILE YOU ARE IN SURGERY.  IF YOU ARE TO BE ADMITTED, ONCE YOU ARE IN YOUR ROOM YOU WILL BE ALLOWED TWO (2) VISITORS. 1 (ONE) VISITOR MAY STAY OVERNIGHT BUT MUST ARRIVE TO THE ROOM BY 8pm.  Minor children may have two parents present. Special consideration for safety and communication needs will be reviewed on a case by case basis.  Special instructions:    Oral Hygiene is also important to reduce your risk of infection.  Remember - BRUSH YOUR TEETH THE MORNING OF SURGERY WITH YOUR REGULAR TOOTHPASTE   [facility]- Preparing For Surgery  Before surgery, you can play an important role. Because skin is not sterile, your skin needs to be as free of germs as possible. You can reduce the number of germs on your skin by washing with CHG (chlorahexidine gluconate) Soap before surgery.  CHG is an antiseptic cleaner which kills germs and bonds with the skin to continue killing germs even after washing.     Please do not use if you have an allergy to CHG or antibacterial soaps. If your skin becomes reddened/irritated stop using the CHG.  Do not  shave (including legs and underarms) for at least 48 hours prior to first CHG shower. It is OK to shave your face.  Please follow these instructions carefully.     Shower the NIGHT BEFORE SURGERY and the MORNING OF SURGERY with CHG Soap.   If you chose to wash your hair, wash your hair first as usual with your normal shampoo. After you shampoo, rinse your hair and body thoroughly to remove the shampoo.  Then [employer] and genitals (private parts) with your normal soap and  rinse thoroughly to remove soap.  After that Use CHG Soap as you would any other liquid soap. You can apply CHG directly to the skin and wash gently with a scrungie or a clean washcloth.   Apply the CHG Soap to your body ONLY FROM THE NECK DOWN.  Do not use on open wounds or open sores. Avoid contact with your eyes, ears, mouth and genitals (private parts). Wash Face and genitals (private parts)  with your normal soap.   Wash thoroughly, paying special attention to the area where your surgery will be performed.  Thoroughly rinse your body with warm water from the neck down.  DO NOT shower/wash with your normal soap after using and rinsing off the CHG Soap.  Pat yourself dry with a CLEAN TOWEL.  Wear CLEAN PAJAMAS to bed the night before surgery  Place CLEAN SHEETS on your bed the night before your surgery  DO NOT SLEEP WITH PETS.   Day of Surgery:  Take a shower with CHG soap. Wear Clean/Comfortable clothing the morning of surgery Do not apply any deodorants/lotions.   Remember to brush your teeth WITH YOUR REGULAR TOOTHPASTE.    COVID testing  If you are going to stay overnight or be admitted after your procedure/surgery and require a pre-op COVID test, please follow these instructions after your COVID test   You are not required to quarantine however you are required to wear a well-fitting mask when you are out and around people not in your household.  If your mask becomes wet or soiled, replace with a new one.  Wash your hands often with soap and water for 20 seconds or clean your hands with an alcohol-based hand sanitizer that contains at least 60% alcohol.  Do not share personal items.  Notify your provider: if you are in close contact with someone who has COVID  or if you develop a fever of 100.4 or greater, sneezing, cough, sore throat, shortness of breath or body aches.    Please read over the following fact sheets that you were given.

## 2021-09-22 ENCOUNTER — Other Ambulatory Visit: Payer: Self-pay

## 2021-09-22 ENCOUNTER — Encounter (HOSPITAL_COMMUNITY)
Admission: RE | Admit: 2021-09-22 | Discharge: 2021-09-22 | Disposition: A | Discharge status: Home / Self Care | Payer: Medicare Other | Source: Ambulatory Visit | Attending: Vascular Surgery | Admitting: Vascular Surgery

## 2021-09-22 ENCOUNTER — Encounter (HOSPITAL_COMMUNITY): Payer: Self-pay

## 2021-09-22 VITALS — BP 147/68 | HR 61 | Temp 98.2°F | Resp 18 | Ht 62.0 in | Wt 155.1 lb

## 2021-09-22 DIAGNOSIS — H409 Unspecified glaucoma: Secondary | ICD-10-CM | POA: Insufficient documentation

## 2021-09-22 DIAGNOSIS — I739 Peripheral vascular disease, unspecified: Secondary | ICD-10-CM | POA: Diagnosis not present

## 2021-09-22 DIAGNOSIS — Z01818 Encounter for other preprocedural examination: Secondary | ICD-10-CM

## 2021-09-22 DIAGNOSIS — K219 Gastro-esophageal reflux disease without esophagitis: Secondary | ICD-10-CM | POA: Diagnosis not present

## 2021-09-22 DIAGNOSIS — E785 Hyperlipidemia, unspecified: Secondary | ICD-10-CM | POA: Insufficient documentation

## 2021-09-22 DIAGNOSIS — Z01812 Encounter for preprocedural laboratory examination: Secondary | ICD-10-CM | POA: Insufficient documentation

## 2021-09-22 DIAGNOSIS — Z8673 Personal history of transient ischemic attack (TIA), and cerebral infarction without residual deficits: Secondary | ICD-10-CM | POA: Diagnosis not present

## 2021-09-22 DIAGNOSIS — I129 Hypertensive chronic kidney disease with stage 1 through stage 4 chronic kidney disease, or unspecified chronic kidney disease: Secondary | ICD-10-CM | POA: Insufficient documentation

## 2021-09-22 DIAGNOSIS — Q0700 Arnold-Chiari syndrome without spina bifida or hydrocephalus: Secondary | ICD-10-CM | POA: Diagnosis not present

## 2021-09-22 DIAGNOSIS — D509 Iron deficiency anemia, unspecified: Secondary | ICD-10-CM | POA: Diagnosis not present

## 2021-09-22 DIAGNOSIS — I2510 Atherosclerotic heart disease of native coronary artery without angina pectoris: Secondary | ICD-10-CM | POA: Diagnosis not present

## 2021-09-22 DIAGNOSIS — I6523 Occlusion and stenosis of bilateral carotid arteries: Secondary | ICD-10-CM | POA: Insufficient documentation

## 2021-09-22 DIAGNOSIS — N183 Chronic kidney disease, stage 3 unspecified: Secondary | ICD-10-CM | POA: Diagnosis not present

## 2021-09-22 DIAGNOSIS — I671 Cerebral aneurysm, nonruptured: Secondary | ICD-10-CM | POA: Diagnosis not present

## 2021-09-22 DIAGNOSIS — Z951 Presence of aortocoronary bypass graft: Secondary | ICD-10-CM | POA: Diagnosis not present

## 2021-09-22 DIAGNOSIS — J449 Chronic obstructive pulmonary disease, unspecified: Secondary | ICD-10-CM | POA: Insufficient documentation

## 2021-09-22 DIAGNOSIS — Z87891 Personal history of nicotine dependence: Secondary | ICD-10-CM | POA: Insufficient documentation

## 2021-09-22 HISTORY — DX: Chronic kidney disease, unspecified: N18.9

## 2021-09-22 LAB — CBC
HCT: 31.9 % — ABNORMAL LOW (ref 36.0–46.0)
Hemoglobin: 9.7 g/dL — ABNORMAL LOW (ref 12.0–15.0)
MCH: 27.5 pg (ref 26.0–34.0)
MCHC: 30.4 g/dL (ref 30.0–36.0)
MCV: 90.4 fL (ref 80.0–100.0)
Platelets: 356 10*3/uL (ref 150–400)
RBC: 3.53 MIL/uL — ABNORMAL LOW (ref 3.87–5.11)
RDW: 17 % — ABNORMAL HIGH (ref 11.5–15.5)
WBC: 8.9 10*3/uL (ref 4.0–10.5)
nRBC: 0 % (ref 0.0–0.2)

## 2021-09-22 LAB — COMPREHENSIVE METABOLIC PANEL
ALT: 9 U/L (ref 0–44)
AST: 19 U/L (ref 15–41)
Albumin: 3.7 g/dL (ref 3.5–5.0)
Alkaline Phosphatase: 70 U/L (ref 38–126)
Anion gap: 12 (ref 5–15)
BUN: 20 mg/dL (ref 8–23)
CO2: 24 mmol/L (ref 22–32)
Calcium: 9.5 mg/dL (ref 8.9–10.3)
Chloride: 104 mmol/L (ref 98–111)
Creatinine, Ser: 1.51 mg/dL — ABNORMAL HIGH (ref 0.44–1.00)
GFR, Estimated: 35 mL/min — ABNORMAL LOW (ref >60)
Glucose, Bld: 107 mg/dL — ABNORMAL HIGH (ref 70–99)
Potassium: 3.8 mmol/L (ref 3.5–5.1)
Sodium: 140 mmol/L (ref 135–145)
Total Bilirubin: 0.4 mg/dL (ref 0.3–1.2)
Total Protein: 7.4 g/dL (ref 6.5–8.1)

## 2021-09-22 LAB — PROTIME-INR
INR: 1 (ref 0.8–1.2)
Prothrombin Time: 13.4 seconds (ref 11.4–15.2)

## 2021-09-22 LAB — URINALYSIS, ROUTINE W REFLEX MICROSCOPIC
Bilirubin Urine: NEGATIVE
Glucose, UA: NEGATIVE mg/dL
Hgb urine dipstick: NEGATIVE
Ketones, ur: NEGATIVE mg/dL
Leukocytes,Ua: NEGATIVE
Nitrite: NEGATIVE
Protein, ur: NEGATIVE mg/dL
Specific Gravity, Urine: 1.004 — ABNORMAL LOW (ref 1.005–1.030)
pH: 6 (ref 5.0–8.0)

## 2021-09-22 LAB — SURGICAL PCR SCREEN
MRSA, PCR: NEGATIVE
Staphylococcus aureus: NEGATIVE

## 2021-09-22 LAB — APTT: aPTT: 33 seconds (ref 24–36)

## 2021-09-22 NOTE — Progress Notes (Addendum)
PCP - [name] Cardiologist - Dr. Terrence Dupont  Chest x-ray - 11/08/20 (1 view) EKG - 11/08/20 Stress Test - 01/01/18 ECHO - 07/03/20 Cardiac Cath - 01/09/18  COVID TEST- pt was Covid + on 07/03/21 - no need to retest before surgery   Anesthesia review: yes, heart history, recent blood transfusions Hgb @ PAT - 9.7, Dr. Scot Dock notified via In box message  Patient denies shortness of breath, fever, cough and chest pain at PAT appointment   All instructions explained to the patient, with a verbal understanding of the material. Patient agrees to go over the instructions while at home for a better understanding. Patient also instructed to self quarantine after being tested for COVID-19. The opportunity to ask questions was provided.

## 2021-09-25 ENCOUNTER — Encounter: Payer: Self-pay | Admitting: Physician Assistant

## 2021-09-25 ENCOUNTER — Encounter: Payer: Self-pay | Admitting: Internal Medicine

## 2021-09-25 NOTE — Anesthesia Preprocedure Evaluation (Deleted)
Anesthesia Evaluation    Airway        Dental   Pulmonary former smoker,           Cardiovascular hypertension,      Neuro/Psych    GI/Hepatic   Endo/Other    Renal/GU      Musculoskeletal   Abdominal   Peds  Hematology   Anesthesia Other Findings   Reproductive/Obstetrics                             Anesthesia Physical Anesthesia Plan  ASA:   Anesthesia Plan:    Post-op Pain Management:    Induction:   PONV Risk Score and Plan:   Airway Management Planned:   Additional Equipment:   Intra-op Plan:   Post-operative Plan:   Informed Consent:   Plan Discussed with:   Anesthesia Plan Comments: (PAT note written 09/25/2021 by Myra Gianotti, PA-C. )        Anesthesia Quick Evaluation

## 2021-09-25 NOTE — Progress Notes (Signed)
Anesthesia Chart Review:  Case: [id number] Date/Time: 09/26/21 0915   Procedure: RIGHT CAROTID ENDARTERECTOMY (Right)   Anesthesia type: General   Pre-op diagnosis: Right carotid artery stenosis   Location: [facility] OR ROOM 11 / [place] OR   Surgeons: [name], MD       DISCUSSION: Patient is a 81 year old female scheduled for the above procedure.  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  Last cardiology visit 07/26/21 with Dr. Terrence Dupont. Denied chest pain or SOB.  BP was 153/80.  He recommended routine BP monitoring with chart and to increase Procardia XL to 30 mg daily if BP remains above 140/80.  Continue follow-up with GI, renal service, and oncology as scheduled.  23-month cardiology follow-up planned.   Last visit with pulmonologist Dr. Valeta Harms on 08/25/21. Lung nodule was stable, continue follow-up with oncology. He wrote, "From respiratory standpoint doing well using Trelegy.  She does feel short of breath with exertion but she feels like it is around her baseline." Continue Trelegy for COPD. Breztri  samples given. Six month follow-up planned.   She received Feraheme, Venofer on 09/20/21 for iron deficiency anemia. H/H up to 9.7/31.9, up from 8.2/26.8 on 08/31/21. T&S done with PAT labs.   + COVID-19 test on 07/03/21 (s/p Paxlovid), so she was not retested at PAT since positive test within past 90 days.   Anesthesia team to evaluate on the day of surgery.  Continue Plavix per [facility] posting.   VS: BP (!) 147/68    Pulse 61    Temp 36.8 C (Oral)    Resp 18    Ht 5\' 2"  (1.575 m)    Wt 70.4 kg    SpO2 97%    BMI 28.37 kg/m    PROVIDERS: [name], MD is PCP ([facility]) - Charolette Forward, MD is cardiologist ([facility]). Last visit 07/26/21.  No new testing ordered. June Leap, DO is pulmonologist. Last visit 08/25/21.  Curt Bears, MD is HEM-ONC. Last visit 02/29/20. Continue observation, serial imaging for lung cancer.  Daily OTC iron recommended for anemia. Tyler Pita, MD is RAD-ONC - Luanne Bras, MD is IR  Carol Ada, MD is GI   LABS: Preoperative labs noted. Cr 1.51, down from 1.63-1.91 since 10/2020. H/H 9.7/31.9, up from 8.2/26.8 on 08/31/21. PAT RN communicated results to surgeon. T&S done with PAT labs.  (all labs ordered are listed, but only abnormal results are displayed)  Labs Reviewed  CBC - Abnormal; Notable for the following components:      Result Value   RBC 3.53 (*)    Hemoglobin 9.7 (*)    HCT 31.9 (*)    RDW 17.0 (*)    All other components within normal limits  COMPREHENSIVE METABOLIC PANEL -  Abnormal; Notable for the following components:   Glucose, Bld 107 (*)    Creatinine, Ser 1.51 (*)    GFR, Estimated 35 (*)    All other components within normal limits  URINALYSIS, ROUTINE W REFLEX MICROSCOPIC - Abnormal; Notable for the following components:   Color, Urine COLORLESS (*)    Specific Gravity, Urine 1.004 (*)    All other components within normal limits  SURGICAL PCR SCREEN   PROTIME-INR  APTT  TYPE AND SCREEN    OTHER: Colonoscopy 01/05/21: Impression:  - A few non-bleeding colonic angiodysplastic lesions. Treated with a monopolar probe. Clip (MR conditional) was placed. - No specimens collected.  Small bowel enteroscopy 06/29/20: Impression: - Normal esophagus. - Two recently bleeding angiodysplastic lesions in the stomach. Treated with a monopolar probe. - Normal examined duodenum. - The examined portion of the jejunum was normal. -No specimens collected.  PFTs 08/26/18: FVC 1.69 (65%), post 1.91 (74%) FEV1 1.12 (58%), post 1.26 (65%) DLCO unc 11.93 (55%), cor 13.35 (61%)   IMAGES: CT Super D Chest 08/22/21: IMPRESSION: 1. Stable nodular post treatment consolidation in the right lower lobe. No evidence of disease progression. 2. Dystrophic calcification with minimal surrounding ground-glass in the anterior segment right upper lobe, grossly similar to 08/25/2018. Renal stone. 3. Possible punctate right 4.  Aortic atherosclerosis (ICD10-I70.0).   MRA Head 06/30/21: IMPRESSION: 1. 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 2. Mild to moderate right M2 branch origin stenosis. 3. Potentially flow reducing stenosis at the right cavernous ICA. - Per Dr. Estanislado Pandy 07/26/21:  ASSESSMENT AND PLAN: - The patient and son were informed of the small right cerebral artery aneurysm remaining stable and unchanged. - Also noted is a non flow restricting stenosis in the proximal cavernous segment of the right internal carotid artery. No evidence of aneurysm seen. - Planned follow-up MRA of the brain in a years time. - However, should the patient feel the headaches are worsening with new neurological symptoms of visual abnormalities, photophobia, abnormal mental status, to call 911.   EKG:  EKG 12/05/20 ([facility]): Sinus bradycardia with sinus arrhythmia.  Ventricular rate 52 bpm.  Nonspecific ST abnormality.   CV: US Carotid  09/14/21: Summary:  Right Carotid: Velocities in the right ICA are consistent with a 80-99%                 stenosis.  Left Carotid: There is no evidence of stenosis in the left ICA.  Vertebrals:  Bilateral vertebral arteries demonstrate antegrade flow.  Subclavians: Normal flow hemodynamics were seen in bilateral subclavian               arteries.    Echo 07/03/20: IMPRESSIONS   1. Left ventricular ejection fraction, by estimation, is 60 to 65%. The  left ventricle has normal function. The left ventricle has no regional  wall motion abnormalities. Left ventricular diastolic parameters are  consistent with Grade II diastolic  dysfunction (pseudonormalization). Elevated left ventricular end-diastolic  pressure.   2. Right ventricular systolic function is normal. The right ventricular  size is normal.   3. Left atrial size was mildly dilated.   4. The mitral valve is degenerative. Mild mitral valve regurgitation. No  evidence of mitral stenosis.   5. The aortic valve is tricuspid. Aortic valve regurgitation is not  visualized. Mild aortic valve stenosis. Aortic valve mean  gradient measures 9.0 mmHg. Aortic valve peak gradient measures 20.6 mmHg.  Aortic valve area, by VTI measures  1.59 cm.  6. The inferior vena cava is dilated in size with >50% respiratory  variability, suggesting right atrial pressure of 8 mmHg.    Cardiac cath 01/09/18: Ost LAD to Prox LAD lesion is 80% stenosed. Ost 1st Diag lesion is 100% stenosed. Prox RCA lesion is 20% stenosed. Mid RCA lesion is 30% stenosed. Mid LM to Dist LM lesion is 40% stenosed. Ost Ramus lesion is 40% stenosed. Ost Cx lesion is 40% stenosed. Origin lesion is 100% stenosed. Origin to Prox Graft lesion is 100% stenosed. The left ventricular systolic function is normal. LV end diastolic pressure is normal. The left ventricular ejection fraction is 50-55% by visual estimate. Findings LV showed good LV systolic function  left  main has 30-40% distal stenosis LAD has 70% diffuse in-stent proximalrestenosis Diagonal 1 ostial stent was 100% occluded Left circumflex as ordered percent ostial stenosis Ramus also has 40-50% ostial stenosis RCA has 20-30% proximal and mid stenosis PDA and PLV branches were small which are patent LIMA to LAD is patent Saphenous vein graft to RCA and obtuse marginal 100% occluded at the ostium Plan Medical management    Past Medical History:  Diagnosis Date   Aneurysm of common iliac artery ([place]) 04/2008   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HCC)    Arnold-Chiari malformation ([place]) 1998   Asthma    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   Blood in stool    last week of aug 2018   Carotid artery occlusion    Chronic kidney disease    Complication of anesthesia    COPD (chronic obstructive pulmonary disease) (HCC)    Coronary artery disease    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   Diverticulitis    Dyspnea    with exertion   Gastroesophageal reflux disease    occ   Glaucoma    right eye   Headache    tension   Headache syndrome 11/27/2018   Hiatal hernia    Hyperlipidemia    Hypertension    Iliac artery aneurysm (HCC)    Lung cancer ([place]) dx 2018   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   Myocardial infarction ([place]) 01/01/2000   Cardiac catheterization   Peripheral vascular disease ([place])    stents in legs x 2 or 3   Pneumonia    last time winter 2017 -2018   PONV (postoperative nausea and vomiting)    occassionally, last colonscopy did ok with anesthesia   Reflux    Wears dentures    Full set   Wears glasses     Past Surgical History:  Procedure Laterality Date   ABDOMINAL HYSTERECTOMY     APPENDECTOMY     Arnold-chiari malformation repair  1998   Suboccipital craniectomy   CAROTID ENDARTERECTOMY  03/29/2010   Left  CEA   CHOLECYSTECTOMY     Gall Bladder   COLONOSCOPY WITH PROPOFOL N/A 04/22/2015   Procedure: COLONOSCOPY WITH PROPOFOL;  Surgeon:  Carol Ada, MD;  Location: [facility];  Service: Endoscopy;  Laterality: N/A;   COLONOSCOPY WITH PROPOFOL N/A 05/25/2016   Procedure: COLONOSCOPY WITH PROPOFOL;  Surgeon: Carol Ada, MD;  Location: [facility];  Service: Endoscopy;  Laterality: N/A;   COLONOSCOPY WITH PROPOFOL N/A 05/03/2017   Procedure: COLONOSCOPY WITH PROPOFOL;  Surgeon: Carol Ada, MD;  Location: [facility];  Service: Endoscopy;  Laterality: N/A;   COLONOSCOPY WITH PROPOFOL N/A 06/29/2020   Procedure: COLONOSCOPY WITH PROPOFOL;  Surgeon: Carol Ada, MD;  Location: [facility];  Service: Endoscopy;  Laterality: N/A;  COLONOSCOPY WITH PROPOFOL N/A 01/05/2021   Procedure: COLONOSCOPY WITH PROPOFOL;  Surgeon: Carol Ada, MD;  Location: [facility];  Service: Endoscopy;  Laterality: N/A;   CORNEAL TRANSPLANT     Right   CORONARY ARTERY BYPASS GRAFT  01/01/2000   x 3   ENTEROSCOPY N/A 02/27/2018   Procedure: ENTEROSCOPY;  Surgeon: Carol Ada, MD;  Location: [facility];  Service: Endoscopy;  Laterality: N/A;   ENTEROSCOPY N/A 07/18/2018   Procedure: ENTEROSCOPY;  Surgeon: Carol Ada, MD;  Location: [facility];  Service: Endoscopy;  Laterality: N/A;   ENTEROSCOPY N/A 06/29/2020   Procedure: ENTEROSCOPY;  Surgeon: Carol Ada, MD;  Location: [facility];  Service: Endoscopy;  Laterality: N/A;   ESOPHAGOGASTRODUODENOSCOPY N/A 05/25/2016   Procedure: ESOPHAGOGASTRODUODENOSCOPY (EGD);  Surgeon: Carol Ada, MD;  Location: [facility];  Service: Endoscopy;  Laterality: N/A;   ESOPHAGOGASTRODUODENOSCOPY N/A 08/01/2018   Procedure: ESOPHAGOGASTRODUODENOSCOPY (EGD);  Surgeon: Carol Ada, MD;  Location: [facility];  Service: Endoscopy;  Laterality: N/A;   EYE SURGERY Right 1995 or 1996   Laser surgery for retinal hemorrhage   GIVENS CAPSULE STUDY N/A 07/16/2018   Procedure: GIVENS CAPSULE STUDY;  Surgeon: Carol Ada, MD;  Location: [facility];  Service: Endoscopy;  Laterality: N/A;   HEMOSTASIS  CLIP PLACEMENT  06/29/2020   Procedure: HEMOSTASIS CLIP PLACEMENT;  Surgeon: Carol Ada, MD;  Location: [facility];  Service: Endoscopy;;   HEMOSTASIS CLIP PLACEMENT  01/05/2021   Procedure: HEMOSTASIS CLIP PLACEMENT;  Surgeon: Carol Ada, MD;  Location: [facility];  Service: Endoscopy;;   HOT HEMOSTASIS N/A 02/27/2018   Procedure: HOT HEMOSTASIS (ARGON PLASMA COAGULATION/BICAP);  Surgeon: Carol Ada, MD;  Location: [facility];  Service: Endoscopy;  Laterality: N/A;   HOT HEMOSTASIS N/A 08/01/2018   Procedure: HOT HEMOSTASIS (ARGON PLASMA COAGULATION/BICAP);  Surgeon: Carol Ada, MD;  Location: [facility];  Service: Endoscopy;  Laterality: N/A;   HOT HEMOSTASIS N/A 06/29/2020   Procedure: HOT HEMOSTASIS (ARGON PLASMA COAGULATION/BICAP);  Surgeon: Carol Ada, MD;  Location: [facility];  Service: Endoscopy;  Laterality: N/A;   HOT HEMOSTASIS N/A 01/05/2021   Procedure: HOT HEMOSTASIS (ARGON PLASMA COAGULATION/BICAP);  Surgeon: Carol Ada, MD;  Location: [facility];  Service: Endoscopy;  Laterality: N/A;   IR RADIOLOGIST EVAL & MGMT  12/14/2016   IR RADIOLOGIST EVAL & MGMT  07/26/2021   LEFT HEART CATH AND CORS/GRAFTS ANGIOGRAPHY N/A 01/09/2018   Procedure: LEFT HEART CATH AND CORS/GRAFTS ANGIOGRAPHY;  Surgeon: Charolette Forward, MD;  Location: [facility];  Service: Cardiovascular;  Laterality: N/A;   LEFT HEART CATHETERIZATION WITH CORONARY ANGIOGRAM N/A 08/03/2014   Procedure: LEFT HEART CATHETERIZATION WITH CORONARY ANGIOGRAM;  Surgeon: Birdie Riddle, MD;  Location: [facility];  Service: Cardiovascular;  Laterality: N/A;   Post Coronary Artery  BPG  01/05/2000   Right jugular sheath removed   PR VEIN BYPASS GRAFT,AORTO-FEM-POP     ROTATOR CUFF REPAIR     Right    MEDICATIONS:  acetaminophen (TYLENOL) 500 MG tablet   albuterol (VENTOLIN HFA) 108 (90 Base) MCG/ACT inhaler   cetirizine (ZYRTEC) 10 MG tablet   clopidogrel (PLAVIX) 75 MG tablet   dexlansoprazole  (DEXILANT) 60 MG capsule   Fluticasone-Umeclidin-Vilant (TRELEGY ELLIPTA) 200-62.5-25 MCG/ACT AEPB   furosemide (LASIX) 40 MG tablet   magnesium oxide (MAG-OX) 400 (241.3 Mg) MG tablet   NIFEdipine (PROCARDIA-XL/NIFEDICAL-XL) 30 MG 24 hr tablet   nitroGLYCERIN (NITROSTAT) 0.4 MG SL tablet   olmesartan (BENICAR) 20 MG tablet   ondansetron (ZOFRAN-ODT) 4 MG disintegrating  tablet   rosuvastatin (CRESTOR) 10 MG tablet   No current facility-administered medications for this encounter.    promethazine (PHENERGAN) injection 6.25-12.5 mg    Myra Gianotti, PA-C Surgical Short Stay/Anesthesiology [facility] Phone [phone] [facility] Phone [phone] 09/25/2021 3:59 PM

## 2021-09-25 NOTE — Anesthesia Preprocedure Evaluation (Addendum)
Anesthesia Evaluation  Patient identified by MRN, date of birth, ID band Patient awake    Reviewed: Allergy & Precautions, NPO status , Patient's Chart, lab work & pertinent test results  History of Anesthesia Complications (+) PONV and history of anesthetic complications  Airway Mallampati: I   Neck ROM: Full    Dental  (+) Upper Dentures, Lower Dentures, Dental Advisory Given   Pulmonary COPD,  COPD inhaler, former smoker,  Lung Ca   Pulmonary exam normal        Cardiovascular hypertension, Pt. on medications + CAD, + Past MI, + CABG and + Peripheral Vascular Disease  Normal cardiovascular exam  Echo 07/03/20: IMPRESSIONS  1. Left ventricular ejection fraction, by estimation, is 60 to 65%. The  left ventricle has normal function. The left ventricle has no regional  wall motion abnormalities. Left ventricular diastolic parameters are  consistent with Grade II diastolic  dysfunction (pseudonormalization). Elevated left ventricular end-diastolic  pressure.  2. Right ventricular systolic function is normal. The right ventricular  size is normal.  3. Left atrial size was mildly dilated.  4. The mitral valve is degenerative. Mild mitral valve regurgitation. No  evidence of mitral stenosis.  5. The aortic valve is tricuspid. Aortic valve regurgitation is not  visualized. Mild aortic valve stenosis. Aortic valve mean  gradient measures 9.0 mmHg. Aortic valve peak gradient measures 20.6 mmHg.  Aortic valve area, by VTI measures  1.59 cm.  6. The inferior vena cava is dilated in size with >50% respiratory  variability, suggesting right atrial pressure of 8 mmHg.    Cardiac cath 01/09/18: ? Ost LAD to Prox LAD lesion is 80% stenosed. ? Ost 1st Diag lesion is 100% stenosed. ? Prox RCA lesion is 20% stenosed. ? Mid RCA lesion is 30% stenosed. ? Mid LM to Dist LM lesion is 40% stenosed. ? Ost Ramus lesion is 40%  stenosed. ? Ost Cx lesion is 40% stenosed. ? Origin lesion is 100% stenosed. ? Origin to Prox Graft lesion is 100% stenosed. ? The left ventricular systolic function is normal. ? LV end diastolic pressure is normal. ? The left ventricular ejection fraction is 50-55% by visual estimate.   Neuro/Psych TIAnegative psych ROS   GI/Hepatic Neg liver ROS, hiatal hernia, GERD  ,  Endo/Other  negative endocrine ROS  Renal/GU Renal InsufficiencyRenal disease  negative genitourinary   Musculoskeletal negative musculoskeletal ROS (+)   Abdominal   Peds negative pediatric ROS (+)  Hematology  (+) Blood dyscrasia, anemia ,   Anesthesia Other Findings   Reproductive/Obstetrics negative OB ROS                            Anesthesia Physical  Anesthesia Plan  ASA: 3  Anesthesia Plan: General   Post-op Pain Management:    Induction: Intravenous  PONV Risk Score and Plan: 4 or greater and Treatment may vary due to age or medical condition, Ondansetron, Dexamethasone and Diphenhydramine  Airway Management Planned: Oral ETT  Additional Equipment: Arterial line  Intra-op Plan:   Post-operative Plan: Extubation in OR  Informed Consent: I have reviewed the patients History and Physical, chart, labs and discussed the procedure including the risks, benefits and alternatives for the proposed anesthesia with the patient or authorized representative who has indicated his/her understanding and acceptance.     Dental advisory given  Plan Discussed with: Anesthesiologist and CRNA  Anesthesia Plan Comments:        Anesthesia Quick  Evaluation

## 2021-09-26 ENCOUNTER — Other Ambulatory Visit: Payer: Self-pay

## 2021-09-26 ENCOUNTER — Inpatient Hospital Stay (HOSPITAL_COMMUNITY): Payer: Medicare Other | Admitting: Anesthesiology

## 2021-09-26 ENCOUNTER — Encounter (HOSPITAL_COMMUNITY)
Admission: RE | Disposition: A | Discharge status: Home / Self Care | Payer: Self-pay | Source: Home / Self Care | Attending: Vascular Surgery

## 2021-09-26 ENCOUNTER — Inpatient Hospital Stay (HOSPITAL_COMMUNITY): Payer: Medicare Other | Admitting: Vascular Surgery

## 2021-09-26 ENCOUNTER — Encounter (HOSPITAL_COMMUNITY): Payer: Self-pay | Admitting: Vascular Surgery

## 2021-09-26 ENCOUNTER — Inpatient Hospital Stay (HOSPITAL_COMMUNITY)
Admission: RE | Admit: 2021-09-26 | Discharge: 2021-09-29 | DRG: 038 | Disposition: A | Discharge status: Home / Self Care | Payer: Medicare Other | Attending: Vascular Surgery | Admitting: Vascular Surgery

## 2021-09-26 DIAGNOSIS — Z7902 Long term (current) use of antithrombotics/antiplatelets: Secondary | ICD-10-CM | POA: Diagnosis not present

## 2021-09-26 DIAGNOSIS — Z8249 Family history of ischemic heart disease and other diseases of the circulatory system: Secondary | ICD-10-CM

## 2021-09-26 DIAGNOSIS — Z832 Family history of diseases of the blood and blood-forming organs and certain disorders involving the immune mechanism: Secondary | ICD-10-CM | POA: Diagnosis not present

## 2021-09-26 DIAGNOSIS — I6521 Occlusion and stenosis of right carotid artery: Secondary | ICD-10-CM

## 2021-09-26 DIAGNOSIS — Z951 Presence of aortocoronary bypass graft: Secondary | ICD-10-CM

## 2021-09-26 DIAGNOSIS — Z20822 Contact with and (suspected) exposure to covid-19: Secondary | ICD-10-CM | POA: Diagnosis present

## 2021-09-26 DIAGNOSIS — I723 Aneurysm of iliac artery: Secondary | ICD-10-CM | POA: Diagnosis present

## 2021-09-26 DIAGNOSIS — I739 Peripheral vascular disease, unspecified: Secondary | ICD-10-CM | POA: Diagnosis present

## 2021-09-26 DIAGNOSIS — D500 Iron deficiency anemia secondary to blood loss (chronic): Secondary | ICD-10-CM | POA: Diagnosis present

## 2021-09-26 DIAGNOSIS — Z885 Allergy status to narcotic agent status: Secondary | ICD-10-CM

## 2021-09-26 DIAGNOSIS — Z923 Personal history of irradiation: Secondary | ICD-10-CM | POA: Diagnosis not present

## 2021-09-26 DIAGNOSIS — Z87891 Personal history of nicotine dependence: Secondary | ICD-10-CM | POA: Diagnosis not present

## 2021-09-26 DIAGNOSIS — Z825 Family history of asthma and other chronic lower respiratory diseases: Secondary | ICD-10-CM

## 2021-09-26 DIAGNOSIS — D62 Acute posthemorrhagic anemia: Secondary | ICD-10-CM | POA: Diagnosis not present

## 2021-09-26 DIAGNOSIS — I10 Essential (primary) hypertension: Secondary | ICD-10-CM

## 2021-09-26 DIAGNOSIS — E785 Hyperlipidemia, unspecified: Secondary | ICD-10-CM | POA: Diagnosis present

## 2021-09-26 DIAGNOSIS — I129 Hypertensive chronic kidney disease with stage 1 through stage 4 chronic kidney disease, or unspecified chronic kidney disease: Secondary | ICD-10-CM | POA: Diagnosis present

## 2021-09-26 DIAGNOSIS — Z823 Family history of stroke: Secondary | ICD-10-CM

## 2021-09-26 DIAGNOSIS — K219 Gastro-esophageal reflux disease without esophagitis: Secondary | ICD-10-CM | POA: Diagnosis present

## 2021-09-26 DIAGNOSIS — K921 Melena: Secondary | ICD-10-CM | POA: Diagnosis present

## 2021-09-26 DIAGNOSIS — J449 Chronic obstructive pulmonary disease, unspecified: Secondary | ICD-10-CM | POA: Diagnosis present

## 2021-09-26 DIAGNOSIS — Z8673 Personal history of transient ischemic attack (TIA), and cerebral infarction without residual deficits: Secondary | ICD-10-CM

## 2021-09-26 DIAGNOSIS — Z85118 Personal history of other malignant neoplasm of bronchus and lung: Secondary | ICD-10-CM | POA: Diagnosis not present

## 2021-09-26 DIAGNOSIS — Z79899 Other long term (current) drug therapy: Secondary | ICD-10-CM

## 2021-09-26 DIAGNOSIS — K5730 Diverticulosis of large intestine without perforation or abscess without bleeding: Secondary | ICD-10-CM | POA: Diagnosis present

## 2021-09-26 DIAGNOSIS — Z947 Corneal transplant status: Secondary | ICD-10-CM | POA: Diagnosis not present

## 2021-09-26 DIAGNOSIS — I252 Old myocardial infarction: Secondary | ICD-10-CM | POA: Diagnosis not present

## 2021-09-26 DIAGNOSIS — N189 Chronic kidney disease, unspecified: Secondary | ICD-10-CM | POA: Diagnosis present

## 2021-09-26 DIAGNOSIS — Z83438 Family history of other disorder of lipoprotein metabolism and other lipidemia: Secondary | ICD-10-CM

## 2021-09-26 DIAGNOSIS — E440 Moderate protein-calorie malnutrition: Secondary | ICD-10-CM | POA: Insufficient documentation

## 2021-09-26 DIAGNOSIS — D509 Iron deficiency anemia, unspecified: Secondary | ICD-10-CM | POA: Diagnosis not present

## 2021-09-26 DIAGNOSIS — I2510 Atherosclerotic heart disease of native coronary artery without angina pectoris: Secondary | ICD-10-CM | POA: Diagnosis present

## 2021-09-26 DIAGNOSIS — Z9889 Other specified postprocedural states: Secondary | ICD-10-CM | POA: Diagnosis not present

## 2021-09-26 DIAGNOSIS — I110 Hypertensive heart disease with heart failure: Secondary | ICD-10-CM | POA: Diagnosis not present

## 2021-09-26 DIAGNOSIS — Z888 Allergy status to other drugs, medicaments and biological substances status: Secondary | ICD-10-CM

## 2021-09-26 DIAGNOSIS — R001 Bradycardia, unspecified: Secondary | ICD-10-CM | POA: Diagnosis not present

## 2021-09-26 HISTORY — PX: ENDARTERECTOMY: SHX5162

## 2021-09-26 HISTORY — PX: PATCH ANGIOPLASTY: SHX6230

## 2021-09-26 LAB — RESP PANEL BY RT-PCR (FLU A&B, COVID) ARPGX2
Influenza A by PCR: NEGATIVE
Influenza B by PCR: NEGATIVE
SARS Coronavirus 2 by RT PCR: NEGATIVE

## 2021-09-26 SURGERY — ENDARTERECTOMY, CAROTID
Anesthesia: General | Laterality: Right

## 2021-09-26 MED ORDER — LIDOCAINE 2% (20 MG/ML) 5 ML SYRINGE
INTRAMUSCULAR | Status: AC
  Filled 2021-09-26: qty 5

## 2021-09-26 MED ORDER — MAGNESIUM SULFATE 2 GM/50ML IV SOLN: 2.0000 g | Freq: Every day | INTRAVENOUS | Status: DC | PRN

## 2021-09-26 MED ORDER — ROCURONIUM BROMIDE 10 MG/ML (PF) SYRINGE
PREFILLED_SYRINGE | INTRAVENOUS | Status: AC
  Filled 2021-09-26: qty 10

## 2021-09-26 MED ORDER — ROSUVASTATIN CALCIUM 5 MG PO TABS
10.0000 mg | ORAL_TABLET | Freq: Every day | ORAL | Status: DC
  Administered 2021-09-26: 10 mg via ORAL
  Filled 2021-09-26: qty 2

## 2021-09-26 MED ORDER — DEXAMETHASONE SODIUM PHOSPHATE 10 MG/ML IJ SOLN
INTRAMUSCULAR | Status: AC
  Filled 2021-09-26: qty 1

## 2021-09-26 MED ORDER — ACETAMINOPHEN 325 MG PO TABS
325.0000 mg | ORAL_TABLET | ORAL | Status: DC | PRN
  Administered 2021-09-26 – 2021-09-29 (×3): 650 mg via ORAL
  Filled 2021-09-26 (×3): qty 2

## 2021-09-26 MED ORDER — PHENYLEPHRINE HCL-NACL 20-0.9 MG/250ML-% IV SOLN
INTRAVENOUS | Status: DC | PRN
  Administered 2021-09-26: 50 ug/min via INTRAVENOUS

## 2021-09-26 MED ORDER — GUAIFENESIN-DM 100-10 MG/5ML PO SYRP: 15.0000 mL | ORAL_SOLUTION | ORAL | Status: DC | PRN

## 2021-09-26 MED ORDER — MORPHINE SULFATE (PF) 2 MG/ML IV SOLN: 2.0000 mg | INTRAVENOUS | Status: DC | PRN

## 2021-09-26 MED ORDER — ACETAMINOPHEN 10 MG/ML IV SOLN
1000.0000 mg | Freq: Once | INTRAVENOUS | Status: AC
  Administered 2021-09-26: 1000 mg via INTRAVENOUS

## 2021-09-26 MED ORDER — DOCUSATE SODIUM 100 MG PO CAPS
100.0000 mg | ORAL_CAPSULE | Freq: Every day | ORAL | Status: DC
  Administered 2021-09-27: 09:00:00 100 mg via ORAL
  Filled 2021-09-26 (×2): qty 1

## 2021-09-26 MED ORDER — DEXTRAN 40 IN SALINE 10-0.9 % IV SOLN
INTRAVENOUS | Status: AC | PRN
  Administered 2021-09-26: 500 mL

## 2021-09-26 MED ORDER — FENTANYL CITRATE (PF) 100 MCG/2ML IJ SOLN: 25.0000 ug | INTRAMUSCULAR | Status: DC | PRN

## 2021-09-26 MED ORDER — ALBUMIN HUMAN 5 % IV SOLN
12.5000 g | Freq: Once | INTRAVENOUS | Status: AC
  Administered 2021-09-26: 12.5 g via INTRAVENOUS

## 2021-09-26 MED ORDER — EPHEDRINE 5 MG/ML INJ
INTRAVENOUS | Status: AC
  Filled 2021-09-26: qty 5

## 2021-09-26 MED ORDER — UMECLIDINIUM BROMIDE 62.5 MCG/ACT IN AEPB
1.0000 | INHALATION_SPRAY | Freq: Every day | RESPIRATORY_TRACT | Status: DC
  Administered 2021-09-27 – 2021-09-29 (×3): 1 via RESPIRATORY_TRACT
  Filled 2021-09-26: qty 7

## 2021-09-26 MED ORDER — EPHEDRINE SULFATE-NACL 50-0.9 MG/10ML-% IV SOSY
PREFILLED_SYRINGE | INTRAVENOUS | Status: DC | PRN
  Administered 2021-09-26: 5 mg via INTRAVENOUS
  Administered 2021-09-26: 10 mg via INTRAVENOUS
  Administered 2021-09-26: 5 mg via INTRAVENOUS

## 2021-09-26 MED ORDER — POTASSIUM CHLORIDE CRYS ER 20 MEQ PO TBCR: 20.0000 meq | EXTENDED_RELEASE_TABLET | Freq: Every day | ORAL | Status: DC | PRN

## 2021-09-26 MED ORDER — METOPROLOL TARTRATE 5 MG/5ML IV SOLN: 2.0000 mg | INTRAVENOUS | Status: DC | PRN

## 2021-09-26 MED ORDER — HEPARIN SODIUM (PORCINE) 1000 UNIT/ML IJ SOLN
INTRAMUSCULAR | Status: DC | PRN
Start: 2021-09-26 — End: 2021-09-26
  Administered 2021-09-26: 7000 [IU] via INTRAVENOUS

## 2021-09-26 MED ORDER — FENTANYL CITRATE (PF) 250 MCG/5ML IJ SOLN
INTRAMUSCULAR | Status: AC
  Filled 2021-09-26: qty 5

## 2021-09-26 MED ORDER — LORATADINE 10 MG PO TABS
10.0000 mg | ORAL_TABLET | Freq: Every day | ORAL | Status: DC
  Administered 2021-09-26 – 2021-09-29 (×4): 10 mg via ORAL
  Filled 2021-09-26 (×4): qty 1

## 2021-09-26 MED ORDER — LIDOCAINE 2% (20 MG/ML) 5 ML SYRINGE
INTRAMUSCULAR | Status: DC | PRN
  Administered 2021-09-26: 100 mg via INTRAVENOUS

## 2021-09-26 MED ORDER — ACETAMINOPHEN 500 MG PO TABS
1000.0000 mg | ORAL_TABLET | Freq: Once | ORAL | Status: AC
  Administered 2021-09-26: 1000 mg via ORAL
  Filled 2021-09-26: qty 2

## 2021-09-26 MED ORDER — LIDOCAINE-EPINEPHRINE (PF) 1 %-1:200000 IJ SOLN
INTRAMUSCULAR | Status: AC
  Filled 2021-09-26: qty 30

## 2021-09-26 MED ORDER — CEFAZOLIN SODIUM-DEXTROSE 2-4 GM/100ML-% IV SOLN
2.0000 g | INTRAVENOUS | Status: AC
  Administered 2021-09-26: 2 g via INTRAVENOUS
  Filled 2021-09-26: qty 100

## 2021-09-26 MED ORDER — ALBUMIN HUMAN 5 % IV SOLN
12.5000 g | Freq: Once | INTRAVENOUS | Status: DC
Start: 2021-09-26 — End: 2021-09-26

## 2021-09-26 MED ORDER — SODIUM CHLORIDE 0.9 % IV SOLN: INTRAVENOUS | Status: DC

## 2021-09-26 MED ORDER — PANTOPRAZOLE SODIUM 40 MG PO TBEC
40.0000 mg | DELAYED_RELEASE_TABLET | Freq: Every day | ORAL | Status: DC
  Administered 2021-09-27 – 2021-09-29 (×3): 40 mg via ORAL
  Filled 2021-09-26 (×3): qty 1

## 2021-09-26 MED ORDER — BISACODYL 10 MG RE SUPP: 10.0000 mg | Freq: Every day | RECTAL | Status: DC | PRN

## 2021-09-26 MED ORDER — HEMOSTATIC AGENTS (NO CHARGE) OPTIME
TOPICAL | Status: DC | PRN
  Administered 2021-09-26: 1 via TOPICAL

## 2021-09-26 MED ORDER — PROTAMINE SULFATE 10 MG/ML IV SOLN
INTRAVENOUS | Status: DC | PRN
  Administered 2021-09-26: 5 mg via INTRAVENOUS
  Administered 2021-09-26: 17 mg via INTRAVENOUS
  Administered 2021-09-26: 18 mg via INTRAVENOUS

## 2021-09-26 MED ORDER — 0.9 % SODIUM CHLORIDE (POUR BTL) OPTIME
TOPICAL | Status: DC | PRN
  Administered 2021-09-26: 2000 mL

## 2021-09-26 MED ORDER — PHENYLEPHRINE 40 MCG/ML (10ML) SYRINGE FOR IV PUSH (FOR BLOOD PRESSURE SUPPORT)
PREFILLED_SYRINGE | INTRAVENOUS | Status: AC
  Filled 2021-09-26: qty 10

## 2021-09-26 MED ORDER — SUGAMMADEX SODIUM 200 MG/2ML IV SOLN
INTRAVENOUS | Status: DC | PRN
  Administered 2021-09-26: 200 mg via INTRAVENOUS

## 2021-09-26 MED ORDER — ALBUTEROL SULFATE HFA 108 (90 BASE) MCG/ACT IN AERS
2.0000 | INHALATION_SPRAY | Freq: Four times a day (QID) | RESPIRATORY_TRACT | Status: DC | PRN
  Filled 2021-09-26: qty 6.7

## 2021-09-26 MED ORDER — PHENYLEPHRINE 40 MCG/ML (10ML) SYRINGE FOR IV PUSH (FOR BLOOD PRESSURE SUPPORT)
PREFILLED_SYRINGE | INTRAVENOUS | Status: DC | PRN
  Administered 2021-09-26 (×2): 40 ug via INTRAVENOUS

## 2021-09-26 MED ORDER — CEFAZOLIN SODIUM-DEXTROSE 2-4 GM/100ML-% IV SOLN
2.0000 g | Freq: Three times a day (TID) | INTRAVENOUS | Status: AC
  Administered 2021-09-26 – 2021-09-27 (×2): 2 g via INTRAVENOUS
  Filled 2021-09-26 (×2): qty 100

## 2021-09-26 MED ORDER — MAGNESIUM OXIDE -MG SUPPLEMENT 400 (240 MG) MG PO TABS
400.0000 mg | ORAL_TABLET | Freq: Every morning | ORAL | Status: DC
  Administered 2021-09-27 – 2021-09-29 (×3): 400 mg via ORAL
  Filled 2021-09-26 (×3): qty 1

## 2021-09-26 MED ORDER — ALUM & MAG HYDROXIDE-SIMETH 200-200-20 MG/5ML PO SUSP: 15.0000 mL | ORAL | Status: DC | PRN

## 2021-09-26 MED ORDER — HYDRALAZINE HCL 20 MG/ML IJ SOLN: 5.0000 mg | INTRAMUSCULAR | Status: DC | PRN

## 2021-09-26 MED ORDER — LIDOCAINE HCL (PF) 1 % IJ SOLN
INTRAMUSCULAR | Status: AC
  Filled 2021-09-26: qty 30

## 2021-09-26 MED ORDER — TRAMADOL HCL 50 MG PO TABS: 50.0000 mg | ORAL_TABLET | Freq: Four times a day (QID) | ORAL | Status: DC | PRN

## 2021-09-26 MED ORDER — LABETALOL HCL 5 MG/ML IV SOLN: 10.0000 mg | INTRAVENOUS | Status: DC | PRN

## 2021-09-26 MED ORDER — CHLORHEXIDINE GLUCONATE CLOTH 2 % EX PADS: 6.0000 | MEDICATED_PAD | Freq: Once | CUTANEOUS | Status: DC

## 2021-09-26 MED ORDER — LIDOCAINE-EPINEPHRINE (PF) 1 %-1:200000 IJ SOLN
INTRAMUSCULAR | Status: DC | PRN
  Administered 2021-09-26: 10 mL

## 2021-09-26 MED ORDER — ROCURONIUM BROMIDE 10 MG/ML (PF) SYRINGE
PREFILLED_SYRINGE | INTRAVENOUS | Status: DC | PRN
  Administered 2021-09-26: 80 mg via INTRAVENOUS

## 2021-09-26 MED ORDER — HEPARIN 6000 UNIT IRRIGATION SOLUTION
Status: AC
  Filled 2021-09-26: qty 500

## 2021-09-26 MED ORDER — PROPOFOL 10 MG/ML IV BOLUS
INTRAVENOUS | Status: DC | PRN
  Administered 2021-09-26: 120 mg via INTRAVENOUS
  Administered 2021-09-26: 30 mg via INTRAVENOUS

## 2021-09-26 MED ORDER — HEPARIN 6000 UNIT IRRIGATION SOLUTION
Status: DC | PRN
  Administered 2021-09-26: 1

## 2021-09-26 MED ORDER — PROPOFOL 10 MG/ML IV BOLUS
INTRAVENOUS | Status: AC
  Filled 2021-09-26: qty 20

## 2021-09-26 MED ORDER — CHLORHEXIDINE GLUCONATE 0.12 % MT SOLN
OROMUCOSAL | Status: AC
  Administered 2021-09-26: 15 mL
  Filled 2021-09-26: qty 15

## 2021-09-26 MED ORDER — ALBUMIN HUMAN 5 % IV SOLN
INTRAVENOUS | Status: AC
  Filled 2021-09-26: qty 250

## 2021-09-26 MED ORDER — ACETAMINOPHEN 650 MG RE SUPP: 325.0000 mg | RECTAL | Status: DC | PRN

## 2021-09-26 MED ORDER — PHENOL 1.4 % MT LIQD: 1.0000 | OROMUCOSAL | Status: DC | PRN

## 2021-09-26 MED ORDER — ONDANSETRON HCL 4 MG/2ML IJ SOLN
INTRAMUSCULAR | Status: AC
  Filled 2021-09-26: qty 2

## 2021-09-26 MED ORDER — ONDANSETRON HCL 4 MG/2ML IJ SOLN: 4.0000 mg | Freq: Four times a day (QID) | INTRAMUSCULAR | Status: DC | PRN

## 2021-09-26 MED ORDER — CLOPIDOGREL BISULFATE 75 MG PO TABS
75.0000 mg | ORAL_TABLET | Freq: Every morning | ORAL | Status: DC
  Administered 2021-09-27 – 2021-09-29 (×3): 75 mg via ORAL
  Filled 2021-09-26 (×3): qty 1

## 2021-09-26 MED ORDER — ONDANSETRON 4 MG PO TBDP: 4.0000 mg | ORAL_TABLET | Freq: Three times a day (TID) | ORAL | Status: DC | PRN

## 2021-09-26 MED ORDER — PROMETHAZINE HCL 25 MG/ML IJ SOLN
INTRAMUSCULAR | Status: AC
  Filled 2021-09-26: qty 1

## 2021-09-26 MED ORDER — DEXAMETHASONE SODIUM PHOSPHATE 10 MG/ML IJ SOLN
INTRAMUSCULAR | Status: DC | PRN
Start: 2021-09-26 — End: 2021-09-26
  Administered 2021-09-26: 5 mg via INTRAVENOUS

## 2021-09-26 MED ORDER — SODIUM CHLORIDE 0.9 % IV SOLN: 500.0000 mL | Freq: Once | INTRAVENOUS | Status: DC | PRN

## 2021-09-26 MED ORDER — FUROSEMIDE 40 MG PO TABS
40.0000 mg | ORAL_TABLET | Freq: Every day | ORAL | Status: DC
Start: 2021-09-26 — End: 2021-09-29
  Administered 2021-09-26 – 2021-09-29 (×4): 40 mg via ORAL
  Filled 2021-09-26 (×4): qty 1

## 2021-09-26 MED ORDER — LACTATED RINGERS IV SOLN: INTRAVENOUS | Status: DC | PRN

## 2021-09-26 MED ORDER — POLYETHYLENE GLYCOL 3350 17 G PO PACK: 17.0000 g | PACK | Freq: Every day | ORAL | Status: DC | PRN

## 2021-09-26 MED ORDER — ACETAMINOPHEN 10 MG/ML IV SOLN
INTRAVENOUS | Status: AC
  Filled 2021-09-26: qty 100

## 2021-09-26 MED ORDER — FENTANYL CITRATE (PF) 100 MCG/2ML IJ SOLN
INTRAMUSCULAR | Status: DC | PRN
  Administered 2021-09-26: 50 ug via INTRAVENOUS
  Administered 2021-09-26: 25 ug via INTRAVENOUS

## 2021-09-26 MED ORDER — PROMETHAZINE HCL 25 MG/ML IJ SOLN
6.2500 mg | INTRAMUSCULAR | Status: DC | PRN
  Administered 2021-09-26: 6.25 mg via INTRAVENOUS

## 2021-09-26 MED ORDER — NITROGLYCERIN 0.4 MG SL SUBL: 0.4000 mg | SUBLINGUAL_TABLET | SUBLINGUAL | Status: DC | PRN

## 2021-09-26 MED ORDER — HYDRALAZINE HCL 20 MG/ML IJ SOLN
INTRAMUSCULAR | Status: DC | PRN
  Administered 2021-09-26: 6 mg via INTRAVENOUS

## 2021-09-26 MED ORDER — DEXTRAN 40 IN SALINE 10-0.9 % IV SOLN
25.0000 mL/h | INTRAVENOUS | Status: DC
  Filled 2021-09-26: qty 500

## 2021-09-26 MED ORDER — FLUTICASONE FUROATE-VILANTEROL 200-25 MCG/ACT IN AEPB
1.0000 | INHALATION_SPRAY | Freq: Every day | RESPIRATORY_TRACT | Status: DC
  Administered 2021-09-27 – 2021-09-29 (×3): 1 via RESPIRATORY_TRACT
  Filled 2021-09-26: qty 28

## 2021-09-26 SURGICAL SUPPLY — 56 items
ADH SKN CLS APL DERMABOND .7 (GAUZE/BANDAGES/DRESSINGS) ×1
AGENT HMST 10 BLLW SHRT CANN (HEMOSTASIS) ×1
BAG COUNTER SPONGE SURGICOUNT (BAG) ×1 IMPLANT
BAG DECANTER FOR FLEXI CONT (MISCELLANEOUS) ×1 IMPLANT
BAG SPNG CNTER NS LX DISP (BAG)
CANISTER SUCT 3000ML PPV (MISCELLANEOUS) ×2 IMPLANT
CANNULA VESSEL 3MM 2 BLNT TIP (CANNULA) ×6 IMPLANT
CATH ROBINSON RED A/P 18FR (CATHETERS) ×2 IMPLANT
CLIP TI WIDE RED SMALL 6 (CLIP) ×1 IMPLANT
CLIP VESOCCLUDE MED 24/CT (CLIP) ×2 IMPLANT
CLIP VESOCCLUDE SM WIDE 24/CT (CLIP) ×2 IMPLANT
DERMABOND ADVANCED (GAUZE/BANDAGES/DRESSINGS) ×1
DERMABOND ADVANCED .7 DNX12 (GAUZE/BANDAGES/DRESSINGS) ×1 IMPLANT
DRAIN CHANNEL 15F RND FF W/TCR (WOUND CARE) IMPLANT
ELECT REM PT RETURN 9FT ADLT (ELECTROSURGICAL) ×2
ELECTRODE REM PT RTRN 9FT ADLT (ELECTROSURGICAL) ×1 IMPLANT
EVACUATOR SILICONE 100CC (DRAIN) IMPLANT
GAUZE 4X4 16PLY ~~LOC~~+RFID DBL (SPONGE) ×2 IMPLANT
GLOVE SRG 8 PF TXTR STRL LF DI (GLOVE) ×1 IMPLANT
GLOVE SURG ENC MOIS LTX SZ6.5 (GLOVE) ×3 IMPLANT
GLOVE SURG ENC MOIS LTX SZ7.5 (GLOVE) ×2 IMPLANT
GLOVE SURG POLY ORTHO LF SZ7.5 (GLOVE) IMPLANT
GLOVE SURG UNDER LTX SZ8 (GLOVE) ×2 IMPLANT
GLOVE SURG UNDER POLY LF SZ7 (GLOVE) IMPLANT
GLOVE SURG UNDER POLY LF SZ8 (GLOVE)
GOWN STRL REUS W/ TWL LRG LVL3 (GOWN DISPOSABLE) ×3 IMPLANT
GOWN STRL REUS W/TWL LRG LVL3 (GOWN DISPOSABLE) ×8
HEMOSTAT HEMOBLAST BELLOWS (HEMOSTASIS) ×1 IMPLANT
KIT BASIN OR (CUSTOM PROCEDURE TRAY) ×2 IMPLANT
KIT SHUNT ARGYLE CAROTID ART 6 (VASCULAR PRODUCTS) ×1 IMPLANT
KIT TURNOVER KIT B (KITS) ×2 IMPLANT
NDL HYPO 25GX1X1/2 BEV (NEEDLE) ×1 IMPLANT
NEEDLE HYPO 25GX1X1/2 BEV (NEEDLE) ×2 IMPLANT
NS IRRIG 1000ML POUR BTL (IV SOLUTION) ×4 IMPLANT
PACK CAROTID (CUSTOM PROCEDURE TRAY) ×2 IMPLANT
PAD ARMBOARD 7.5X6 YLW CONV (MISCELLANEOUS) ×4 IMPLANT
PATCH VASC XENOSURE 1CMX6CM (Vascular Products) ×2 IMPLANT
PATCH VASC XENOSURE 1X6 (Vascular Products) IMPLANT
POSITIONER HEAD DONUT 9IN (MISCELLANEOUS) ×2 IMPLANT
SET WALTER ACTIVATION W/DRAPE (SET/KITS/TRAYS/PACK) ×1 IMPLANT
SHUNT CAROTID BYPASS 10 (VASCULAR PRODUCTS) IMPLANT
SHUNT CAROTID BYPASS 12 (VASCULAR PRODUCTS) IMPLANT
SPONGE SURGIFOAM ABS GEL 100 (HEMOSTASIS) IMPLANT
SPONGE T-LAP 18X18 ~~LOC~~+RFID (SPONGE) ×1 IMPLANT
SPONGE T-LAP 4X18 ~~LOC~~+RFID (SPONGE) ×1 IMPLANT
SUT MNCRL AB 4-0 PS2 18 (SUTURE) ×2 IMPLANT
SUT PROLENE 6 0 BV (SUTURE) ×6 IMPLANT
SUT SILK 2 0 PERMA HAND 18 BK (SUTURE) IMPLANT
SUT SILK 3 0 (SUTURE) ×2
SUT SILK 3-0 18XBRD TIE 12 (SUTURE) IMPLANT
SUT VIC AB 3-0 SH 27 (SUTURE) ×2
SUT VIC AB 3-0 SH 27X BRD (SUTURE) ×1 IMPLANT
SYR 20ML LL LF (SYRINGE) ×2 IMPLANT
SYR CONTROL 10ML LL (SYRINGE) ×2 IMPLANT
TOWEL GREEN STERILE (TOWEL DISPOSABLE) ×2 IMPLANT
WATER STERILE IRR 1000ML POUR (IV SOLUTION) ×2 IMPLANT

## 2021-09-26 NOTE — Discharge Instructions (Signed)
   [facility]  Discharge Instructions   Carotid Surgery  Please refer to the following instructions for your post-procedure care. Your surgeon or physician assistant will discuss any changes with you.  Activity  You are encouraged to walk as much as you can. You can slowly return to normal activities but must avoid strenuous activity and heavy lifting until your doctor tell you it's okay. Avoid activities such as vacuuming or swinging a golf club. You can drive after one week if you are comfortable and you are no longer taking prescription pain medications. It is normal to feel tired for serval weeks after your surgery. It is also normal to have difficulty with sleep habits, eating, and bowel movements after surgery. These will go away with time.  Bathing/Showering  Shower daily after you go home. Do not soak in a bathtub, hot tub, or swim until the incision heals completely.  Incision Care  Shower every day. Clean your incision with mild soap and water. Pat the area dry with a clean towel. You do not need a bandage unless otherwise instructed. Do not apply any ointments or creams to your incision. You may have skin glue on your incision. Do not peel it off. It will come off on its own in about one week. Your incision may feel thickened and raised for several weeks after your surgery. This is normal and the skin will soften over time.   For Men Only: It's okay to shave around the incision but do not shave the incision itself for 2 weeks. It is common to have numbness under your chin that could last for several months.  Diet  Resume your normal diet. There are no special food restrictions following this procedure. A low fat/low cholesterol diet is recommended for all patients with vascular disease. In order to heal from your surgery, it is CRITICAL to get adequate nutrition. Your body requires vitamins, minerals, and protein. Vegetables are the best source of  vitamins and minerals. Vegetables also provide the perfect balance of protein. Processed food has little nutritional value, so try to avoid this.  Medications  Resume taking all of your medications unless your doctor or physician assistant tells you not to. If your incision is causing pain, you may take over-the- counter pain relievers such as acetaminophen (Tylenol). If you were prescribed a stronger pain medication, please be aware these medications can cause nausea and constipation. Prevent nausea by taking the medication with a snack or meal. Avoid constipation by drinking plenty of fluids and eating foods with a high amount of fiber, such as fruits, vegetables, and grains.   Do not take Tylenol if you are taking prescription pain medications.  Follow Up  Our office will schedule a follow up appointment 2-3 weeks following discharge.  Please call us immediately for any of the following conditions  . Increased pain, redness, drainage (pus) from your incision site. . Fever of 101 degrees or higher. . If you should develop stroke (slurred speech, difficulty swallowing, weakness on one side of your body, loss of vision) you should call 911 and go to the nearest emergency room. .  Reduce your risk of vascular disease:  . Stop smoking. If you would like help call [facility] at 1-800-QUIT-NOW (1-800-784-8669) or [facility] at 336-586-4000. . Manage your cholesterol . Maintain a desired weight . Control your diabetes . Keep your blood pressure down .  If you have any questions, please call the office at [phone]. 

## 2021-09-26 NOTE — Progress Notes (Addendum)
°  Day of Surgery Note    Subjective:  says she has a headache and it is located on the top of her head.   Vitals:   09/26/21 1255 09/26/21 1300  BP: (!) 101/39   Pulse: (!) 58 (!) 56  Resp: 16 14  Temp:    SpO2: 100% 100%    Incisions:   clean and dry without hematoma Extremities:  moving all extremities equally Cardiac:  regular Lungs:  non labored    Assessment/Plan:  This is a 81 y.o. female who is s/p  Right CEA for asymptomatic carotid artery stenosis  -pt doing well in pacu and neuro in tact -BP with cuff symmetric in both arms and the MAP correlates with a-line.   -low diastolic pressure with MAP of 50.  Albumin ordered -tylenol for headache -to 4 east later today. -hold antihypertensives until BP rebounds.  -restart Plavix tomorrow (ordered)   Leontine Locket, PA-C 09/26/2021 1:19 PM [id number]  I have interviewed the patient and examined the patient. I agree with the findings by the PA. Doing well post op.   Gae Gallop, MD

## 2021-09-26 NOTE — Anesthesia Procedure Notes (Signed)
Procedure Name: Intubation Date/Time: 09/26/2021 9:37 AM Performed by: Georgia Duff, CRNA Pre-anesthesia Checklist: Patient identified, Emergency Drugs available, Suction available and Patient being monitored Patient Re-evaluated:Patient Re-evaluated prior to induction Oxygen Delivery Method: Circle System Utilized Preoxygenation: Pre-oxygenation with 100% oxygen Induction Type: IV induction Ventilation: Mask ventilation without difficulty Laryngoscope Size: Miller and 2 Grade View: Grade I Tube type: Oral Tube size: 7.0 mm Number of attempts: 1 Airway Equipment and Method: Stylet and Oral airway Placement Confirmation: ETT inserted through vocal cords under direct vision, positive ETCO2 and breath sounds checked- equal and bilateral Secured at: 21 cm Tube secured with: Tape Dental Injury: Teeth and Oropharynx as per pre-operative assessment

## 2021-09-26 NOTE — Transfer of Care (Signed)
Immediate Anesthesia Transfer of Care Note  Patient: [name]  Procedure(s) Performed: RIGHT CAROTID ENDARTERECTOMY (Right) PATCH ANGIOPLASTY RIGHT CAROTID (Right)  Patient Location: PACU  Anesthesia Type:General  Level of Consciousness: drowsy and patient cooperative  Airway & Oxygen Therapy: Patient Spontanous Breathing  Post-op Assessment: Report given to RN and Post -op Vital signs reviewed and stable  Post vital signs: Reviewed and stable  Last Vitals:  Vitals Value Taken Time  BP 111/34 09/26/21 1208  Temp    Pulse 56 09/26/21 1210  Resp 21 09/26/21 1210  SpO2 93 % 09/26/21 1210  Vitals shown include unvalidated device data.  Last Pain:  Vitals:   09/26/21 0755  TempSrc:   PainSc: 0-No pain         Complications: No notable events documented.

## 2021-09-26 NOTE — Anesthesia Procedure Notes (Signed)
Arterial Line Insertion Start/End2/14/2023 9:00 AM, 09/26/2021 9:15 AM Performed by: Duane Boston, MD, CRNA  Lidocaine 1% used for infiltration Right, radial was placed Catheter size: 20 G  Attempts: 1 Procedure performed without using ultrasound guided technique. Following insertion, dressing applied and Biopatch. Post procedure assessment: normal  Patient tolerated the procedure well with no immediate complications.

## 2021-09-26 NOTE — Op Note (Signed)
° ° °  NAME: [name]    MRN: [id number] DOB: September 04, 1940    DATE OF OPERATION: 09/26/2021  PREOP DIAGNOSIS:    Asymptomatic greater than 80% right carotid stenosis  POSTOP DIAGNOSIS:    Same  PROCEDURE:    Right carotid endarterectomy with bovine pericardial patch angioplasty  SURGEON: Judeth Cornfield. Scot Dock, MD  ASSIST: Liana Crocker, PA  ANESTHESIA: General  EBL: 100 cc  INDICATIONS:    [name] is a 81 y.o. female who had undergone a previous left carotid endarterectomy in 2013.  We have been following a moderate right carotid stenosis.  This progressed to greater than 80%.  Right carotid endarterectomy was recommended in order to lower her risk of future stroke.  FINDINGS:   Long segment 90% right carotid stenosis  TECHNIQUE:   The patient was taken to the operating room and received a general anesthetic.  After careful positioning the right neck was prepped and draped in the usual sterile fashion.  An incision was made along the anterior border of the sternocleidomastoid and the dissection carried down to the common carotid artery which was dissected free and controlled with Rummel tourniquet.  The patient was heparinized.  ACT was monitored throughout the procedure.  Facial vein was divided between 2-0 silk ties.  I was able to control the internal carotid artery above the plaque.  The dissection was quite high and I had to fully mobilized the hypoglossal nerve.  I used the Walter retractor to help with distal exposure.  The superior thyroid and external carotid arteries were also controlled.  Next the internal then common and then external carotid arteries were clamped.  A longitudinal arteriotomy was made in the common carotid artery.  This was extended through the plaque into the internal carotid artery.  A 10 shunt was placed into the internal carotid artery, backbled and then placed into the common carotid artery and secured with Rummel tourniquet.  Flow was  reestablished to the shunt.  Flow was checked with the Doppler.  An endarterectomy plane was established proximally and the plaque was sharply divided.  Eversion endarterectomy was performed of the external carotid artery.  Distally was a nice tapering the plaque and no tacking sutures were required.  The artery was irrigated with copious amounts of heparin and dextran and all loose debris removed.  A bovine pericardial patch was then sewn using continuous 6-0 Prolene suture.  Prior to completing the patch closure the shunt was removed.  The artery was backbled and flushed appropriately and the anastomosis completed.  Flow was reestablished first to the external carotid artery and into the internal carotid artery.  There was a good pulse distal to the patch and good diastolic flow.  Heparin was partially reversed with protamine.  Hemostasis was obtained in the wound.  The deep layer was closed with running 3-0 Vicryl.  The platysma was closed with running 3-0 Vicryl.  The skin was closed with 4-0 Monocryl.  Dermabond was applied.  The patient awoke neurologically intact.  All needle and sponge counts were correct.  She was transferred to the recovery room in stable condition.  Given the complexity of the case a first assistant was necessary in order to expedient the procedure and safely perform the technical aspects of the operation.  Deitra Mayo, MD, FACS [facility]  DATE OF DICTATION:   09/26/2021

## 2021-09-26 NOTE — Interval H&P Note (Signed)
History and Physical Interval Note:  09/26/2021 9:21 AM  [name]  has presented today for surgery, with the diagnosis of Right carotid artery stenosis.  The various methods of treatment have been discussed with the patient and family. After consideration of risks, benefits and other options for treatment, the patient has consented to  Procedure(s): RIGHT CAROTID ENDARTERECTOMY (Right) as a surgical intervention.  The patient's history has been reviewed, patient examined, no change in status, stable for surgery.  I have reviewed the patient's chart and labs.  Questions were answered to the patient's satisfaction.     Deitra Mayo

## 2021-09-26 NOTE — Anesthesia Postprocedure Evaluation (Signed)
Anesthesia Post Note  Patient: [name]  Procedure(s) Performed: RIGHT CAROTID ENDARTERECTOMY (Right) PATCH ANGIOPLASTY RIGHT CAROTID (Right)     Patient location during evaluation: PACU Anesthesia Type: General Level of consciousness: sedated Pain management: pain level controlled Vital Signs Assessment: post-procedure vital signs reviewed and stable Respiratory status: spontaneous breathing and respiratory function stable Cardiovascular status: stable Postop Assessment: no apparent nausea or vomiting Anesthetic complications: no   No notable events documented.  Last Vitals:  Vitals:   09/26/21 1300 09/26/21 1310  BP:  (!) 105/33  Pulse: (!) 56 (!) 52  Resp: 14 15  Temp:    SpO2: 100% 100%    Last Pain:  Vitals:   09/26/21 1240  TempSrc:   PainSc: Asleep                 Contessa Preuss DANIEL

## 2021-09-27 ENCOUNTER — Encounter (HOSPITAL_COMMUNITY): Payer: Self-pay | Admitting: Vascular Surgery

## 2021-09-27 LAB — LIPID PANEL
Cholesterol: 112 mg/dL (ref 0–200)
HDL: 41 mg/dL (ref >40)
LDL Cholesterol: 59 mg/dL (ref 0–99)
Total CHOL/HDL Ratio: 2.7 RATIO
Triglycerides: 62 mg/dL (ref <150)
VLDL: 12 mg/dL (ref 0–40)

## 2021-09-27 LAB — CBC
HCT: 20.9 % — ABNORMAL LOW (ref 36.0–46.0)
HCT: 27.2 % — ABNORMAL LOW (ref 36.0–46.0)
Hemoglobin: 6.6 g/dL — CL (ref 12.0–15.0)
Hemoglobin: 9 g/dL — ABNORMAL LOW (ref 12.0–15.0)
MCH: 28.2 pg (ref 26.0–34.0)
MCH: 28.3 pg (ref 26.0–34.0)
MCHC: 31.6 g/dL (ref 30.0–36.0)
MCHC: 33.1 g/dL (ref 30.0–36.0)
MCV: 89.3 fL (ref 80.0–100.0)
MCV: 85.5 fL (ref 80.0–100.0)
Platelets: 207 10*3/uL (ref 150–400)
Platelets: 201 K/uL (ref 150–400)
RBC: 2.34 MIL/uL — ABNORMAL LOW (ref 3.87–5.11)
RBC: 3.18 MIL/uL — ABNORMAL LOW (ref 3.87–5.11)
RDW: 18.1 % — ABNORMAL HIGH (ref 11.5–15.5)
RDW: 18.9 % — ABNORMAL HIGH (ref 11.5–15.5)
WBC: 7.5 10*3/uL (ref 4.0–10.5)
WBC: 8.2 K/uL (ref 4.0–10.5)
nRBC: 0 % (ref 0.0–0.2)
nRBC: 0 % (ref 0.0–0.2)

## 2021-09-27 LAB — BASIC METABOLIC PANEL
Anion gap: 8 (ref 5–15)
BUN: 29 mg/dL — ABNORMAL HIGH (ref 8–23)
CO2: 21 mmol/L — ABNORMAL LOW (ref 22–32)
Calcium: 8.2 mg/dL — ABNORMAL LOW (ref 8.9–10.3)
Chloride: 109 mmol/L (ref 98–111)
Creatinine, Ser: 1.75 mg/dL — ABNORMAL HIGH (ref 0.44–1.00)
GFR, Estimated: 29 mL/min — ABNORMAL LOW (ref >60)
Glucose, Bld: 103 mg/dL — ABNORMAL HIGH (ref 70–99)
Potassium: 4.4 mmol/L (ref 3.5–5.1)
Sodium: 138 mmol/L (ref 135–145)

## 2021-09-27 LAB — PREPARE RBC (CROSSMATCH)

## 2021-09-27 MED ORDER — SODIUM CHLORIDE 0.9% IV SOLUTION: Freq: Once | INTRAVENOUS | Status: AC

## 2021-09-27 MED ORDER — ATORVASTATIN CALCIUM 40 MG PO TABS
40.0000 mg | ORAL_TABLET | Freq: Every day | ORAL | Status: DC
  Administered 2021-09-27 – 2021-09-28 (×2): 40 mg via ORAL
  Filled 2021-09-27 (×2): qty 1

## 2021-09-27 MED ORDER — SODIUM CHLORIDE 0.9 % IV SOLN: INTRAVENOUS | Status: DC

## 2021-09-27 MED ORDER — PEG 3350-KCL-NA BICARB-NACL 420 G PO SOLR
4000.0000 mL | Freq: Once | ORAL | Status: AC
  Administered 2021-09-27: 4000 mL via ORAL
  Filled 2021-09-27: qty 4000

## 2021-09-27 NOTE — Consult Note (Signed)
Reason for Consult: Anemia Referring Physician: Vascular Surgery  [name] HPI: This is a 81 year old female who is well-known to me.  She was admitted for an elective right CEA.  After the surgery routine monitoring of her CBC showed that her HGB declined from the 9.7 g/dL down to the 6.6 g/dL range.  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  The patient was scheduled for the procedures in April as a result of staffing issues.  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   Diverticulitis    Dyspnea    with exertion   Gastroesophageal reflux disease    occ   Glaucoma    right eye   Headache    tension   Headache syndrome 11/27/2018   Hiatal hernia    Hyperlipidemia    Hypertension    Iliac artery aneurysm (HCC)    Lung cancer ([place]) dx 2018   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   Myocardial infarction ([place]) 01/01/2000   Cardiac catheterization   Peripheral vascular disease (HCC)    stents in legs x 2 or 3   Pneumonia    last time winter 2017 -2018   PONV (postoperative nausea and vomiting)    occassionally, last colonscopy did ok with anesthesia   Reflux    Wears dentures    Full set   Wears glasses     Past Surgical History:  Procedure Laterality Date   ABDOMINAL HYSTERECTOMY     APPENDECTOMY     Arnold-chiari malformation repair  1998   Suboccipital craniectomy   CAROTID ENDARTERECTOMY  03/29/2010   Left  CEA   CHOLECYSTECTOMY     Gall Bladder   COLONOSCOPY WITH PROPOFOL N/A 04/22/2015   Procedure: COLONOSCOPY WITH PROPOFOL;   Surgeon: Carol Ada, MD;  Location: [facility];  Service: Endoscopy;  Laterality: N/A;   COLONOSCOPY WITH PROPOFOL N/A 05/25/2016   Procedure: COLONOSCOPY WITH PROPOFOL;  Surgeon: Carol Ada, MD;  Location: [facility];  Service: Endoscopy;  Laterality: N/A;   COLONOSCOPY WITH PROPOFOL N/A 05/03/2017   Procedure: COLONOSCOPY WITH PROPOFOL;  Surgeon: Carol Ada, MD;  Location: [facility];  Service: Endoscopy;  Laterality: N/A;   COLONOSCOPY WITH PROPOFOL N/A 06/29/2020   Procedure: COLONOSCOPY WITH PROPOFOL;  Surgeon: Carol Ada, MD;  Location: [facility];  Service: Endoscopy;  Laterality: N/A;   COLONOSCOPY WITH PROPOFOL N/A 01/05/2021   Procedure: COLONOSCOPY WITH PROPOFOL;  Surgeon: Carol Ada, MD;  [facility]: [facility];  Service: Endoscopy;  Laterality: N/A;   CORNEAL TRANSPLANT     Right   CORONARY ARTERY BYPASS GRAFT  01/01/2000   x 3   ENDARTERECTOMY Right 09/26/2021   Procedure: RIGHT CAROTID ENDARTERECTOMY;  Surgeon: [name], MD;  Location: [facility];  Service: Vascular;  Laterality: Right;   ENTEROSCOPY N/A 02/27/2018   Procedure: ENTEROSCOPY;  Surgeon: Carol Ada, MD;  [facility];  Service: Endoscopy;  Laterality: N/A;   ENTEROSCOPY N/A 07/18/2018   Procedure:  ENTEROSCOPY;  Surgeon: Carol Ada, MD;  Location: [facility];  Service: Endoscopy;  Laterality: N/A;   ENTEROSCOPY N/A 06/29/2020   Procedure: ENTEROSCOPY;  Surgeon: Carol Ada, MD;  [facility];  Service: Endoscopy;  Laterality: N/A;   ESOPHAGOGASTRODUODENOSCOPY N/A 05/25/2016   Procedure: ESOPHAGOGASTRODUODENOSCOPY (EGD);  Surgeon: Carol Ada, MD;  Location: [facility];  Service: Endoscopy;  Laterality: N/A;   ESOPHAGOGASTRODUODENOSCOPY N/A 08/01/2018   Procedure: ESOPHAGOGASTRODUODENOSCOPY (EGD);  Surgeon: Carol Ada, MD;  Location: [facility];  Service: Endoscopy;  Laterality: N/A;   EYE SURGERY Right 1995 or 1996   Laser surgery for retinal hemorrhage    GIVENS CAPSULE STUDY N/A 07/16/2018   Procedure: GIVENS CAPSULE STUDY;  Surgeon: Carol Ada, MD;  [facility];  Service: Endoscopy;  Laterality: N/A;   HEMOSTASIS CLIP PLACEMENT  06/29/2020   Procedure: HEMOSTASIS CLIP PLACEMENT;  Surgeon: Carol Ada, MD;  [facility];  Service: Endoscopy;;   HEMOSTASIS CLIP PLACEMENT  01/05/2021   Procedure: HEMOSTASIS CLIP PLACEMENT;  Surgeon: Carol Ada, MD;  [facility];  Service: Endoscopy;;   HOT HEMOSTASIS N/A 02/27/2018   Procedure: HOT HEMOSTASIS (ARGON PLASMA COAGULATION/BICAP);  Surgeon: Carol Ada, MD;  Location: [facility];  Service: Endoscopy;  Laterality: N/A;   HOT HEMOSTASIS N/A 08/01/2018   Procedure: HOT HEMOSTASIS (ARGON PLASMA COAGULATION/BICAP);  Surgeon: Carol Ada, MD;  Location: [facility];  Service: Endoscopy;  Laterality: N/A;   HOT HEMOSTASIS N/A 06/29/2020   Procedure: HOT HEMOSTASIS (ARGON PLASMA COAGULATION/BICAP);  Surgeon: Carol Ada, MD;  Location: [facility];  Service: Endoscopy;  Laterality: N/A;   HOT HEMOSTASIS N/A 01/05/2021   Procedure: HOT HEMOSTASIS (ARGON PLASMA COAGULATION/BICAP);  Surgeon: Carol Ada, MD;  Location: [facility];  Service: Endoscopy;  Laterality: N/A;   IR RADIOLOGIST EVAL & MGMT  12/14/2016   IR RADIOLOGIST EVAL & MGMT  07/26/2021   LEFT HEART CATH AND CORS/GRAFTS ANGIOGRAPHY N/A 01/09/2018   Procedure: LEFT HEART CATH AND CORS/GRAFTS ANGIOGRAPHY;  Surgeon: Charolette Forward, MD;  Location: [facility];  Service: Cardiovascular;  Laterality: N/A;   LEFT HEART CATHETERIZATION WITH CORONARY ANGIOGRAM N/A 08/03/2014   Procedure: LEFT HEART CATHETERIZATION WITH CORONARY ANGIOGRAM;  Surgeon: Birdie Riddle, MD;  Location: [facility];  Service: Cardiovascular;  Laterality: N/A;   PATCH ANGIOPLASTY Right 09/26/2021   Procedure: PATCH ANGIOPLASTY RIGHT CAROTID;  Surgeon: [name], MD;  Location: [facility] OR;  Service: Vascular;  Laterality:  Right;   Post Coronary Artery  BPG  01/05/2000   Right jugular sheath removed   PR VEIN BYPASS GRAFT,AORTO-FEM-POP     ROTATOR CUFF REPAIR     Right    Family History  Problem Relation Age of Onset   Heart disease Mother        Heart Disease before age 30   Hypertension Mother    Hyperlipidemia Mother    Heart attack Mother    Clotting disorder Mother    Heart disease Father        Heart Disease before age 55   Heart attack Father    Hyperlipidemia Father    Hypertension Father    Heart disease Brother        Heart Disease before age 27   Hyperlipidemia Brother    Hypertension Brother    Clotting disorder Brother    AAA (abdominal aortic aneurysm) Brother    Cerebral aneurysm Sister    Hypertension Sister    AAA (abdominal aortic aneurysm) Sister    Asthma Sister  Cerebral aneurysm Brother    Cancer Brother        Lung   Hypertension Brother    Heart attack Brother    Heart disease Brother        Aneurysm of Brain   Hypertension Brother    Heart disease Brother    Heart disease Brother    Stroke Son        Aneurysm of Stomach   AAA (abdominal aortic aneurysm) Son    Cancer Maternal Uncle        great uncle/cancer/type unknown    Social History:  reports that she quit smoking about 21 years ago. Her smoking use included cigarettes. She has a 45.00 pack-year smoking history. She has never used smokeless tobacco. She reports that she does not drink alcohol and does not use drugs.  Allergies:  Allergies  Allergen Reactions   Azithromycin Swelling    Patient reported past history of lip swelling   Codeine Other (See Comments)    Dr. Terrence Dupont advised patient not to take this medication   Doxycycline Swelling    Mouth, lips, feet swelling   Hydromorphone Palpitations and Other (See Comments)    DILAUDID  -  Pt had a Heart Attack after taking Dilaudid.   Levaquin [Levofloxacin] Other (See Comments)    Chest pressure, SOB, "pain in between shoulder blades", sweaty  -as reported by patient per experience in ED this afternoon   Vitamin D Analogs Swelling   Avelox [Moxifloxacin Hcl In Nacl] Palpitations    Caused Heart Attack    Oxycodone-Acetaminophen Other (See Comments)    Says it makes her feel weird   Risedronate Other (See Comments)    Chest pain    Medications: Scheduled:  atorvastatin  40 mg Oral QHS   clopidogrel  75 mg Oral q AM   docusate sodium  100 mg Oral Daily   fluticasone furoate-vilanterol  1 puff Inhalation Daily   And   umeclidinium bromide  1 puff Inhalation Daily   furosemide  40 mg Oral Daily   loratadine  10 mg Oral Daily   magnesium oxide  400 mg Oral q AM   pantoprazole  40 mg Oral Daily   polyethylene glycol-electrolytes  4,000 mL Oral Once   Continuous:  sodium chloride     sodium chloride 75 mL/hr at 09/26/21 1552   magnesium sulfate bolus IVPB      Results for orders placed or performed during the hospital encounter of 09/26/21 (from the past 24 hour(s))  Resp Panel by RT-PCR (Flu A&B, Covid) Nasopharyngeal Swab     Status: None   Collection Time: 09/26/21  1:46 PM   Specimen: Nasopharyngeal Swab; Nasopharyngeal(NP) swabs in vial transport medium  Result Value Ref Range   SARS Coronavirus 2 by RT PCR NEGATIVE NEGATIVE   Influenza A by PCR NEGATIVE NEGATIVE   Influenza B by PCR NEGATIVE NEGATIVE  CBC     Status: Abnormal   Collection Time: 09/27/21  5:04 AM  Result Value Ref Range   WBC 7.5 4.0 - 10.5 K/uL   RBC 2.34 (L) 3.87 - 5.11 MIL/uL   Hemoglobin 6.6 (LL) 12.0 - 15.0 g/dL   HCT 20.9 (L) 36.0 - 46.0 %   MCV 89.3 80.0 - 100.0 fL   MCH 28.2 26.0 - 34.0 pg   MCHC 31.6 30.0 - 36.0 g/dL   RDW 18.1 (H) 11.5 - 15.5 %   Platelets 207 150 - 400 K/uL   nRBC 0.0 0.0 -  0.2 %  Basic metabolic panel     Status: Abnormal   Collection Time: 09/27/21  5:04 AM  Result Value Ref Range   Sodium 138 135 - 145 mmol/L   Potassium 4.4 3.5 - 5.1 mmol/L   Chloride 109 98 - 111 mmol/L   CO2 21 (L) 22 - 32 mmol/L    Glucose, Bld 103 (H) 70 - 99 mg/dL   BUN 29 (H) 8 - 23 mg/dL   Creatinine, Ser 1.75 (H) 0.44 - 1.00 mg/dL   Calcium 8.2 (L) 8.9 - 10.3 mg/dL   GFR, Estimated 29 (L) >60 mL/min   Anion gap 8 5 - 15  Lipid panel     Status: None   Collection Time: 09/27/21  5:04 AM  Result Value Ref Range   Cholesterol 112 0 - 200 mg/dL   Triglycerides 62 <150 mg/dL   HDL 41 >40 mg/dL   Total CHOL/HDL Ratio 2.7 RATIO   VLDL 12 0 - 40 mg/dL   LDL Cholesterol 59 0 - 99 mg/dL  Prepare RBC (crossmatch)     Status: None   Collection Time: 09/27/21  6:50 AM  Result Value Ref Range   Order Confirmation      ORDER PROCESSED BY BLOOD BANK Performed at [facility], [address], [place], [place] [address]      No results found.  ROS:  As stated above in the HPI otherwise negative.  Blood pressure (!) 140/47, pulse (!) 50, temperature 97.8 F (36.6 C), temperature source Oral, resp. rate 19, height 5\' 2"  (1.575 m), weight 67.1 kg, SpO2 97 %.    PE: Gen: NAD, Alert and Oriented HEENT:  /AT, EOMI Neck: Supple, no LAD Lungs: CTA Bilaterally CV: RRR without M/G/R ABD: Soft, NTND, +BS Ext: No C/C/E  Assessment/Plan: 1) Anemia. 2) S/p CEA. 3) History of lung cancer.  Plan: 1) EGD/colonoscopy tomorrow.  Bisma Klett D 09/27/2021, 12:17 PM

## 2021-09-27 NOTE — Progress Notes (Signed)
PHARMACIST LIPID MONITORING   [name] is a 81 y.o. female admitted on 09/26/2021 s/p CEA.  Pharmacy has been consulted to optimize lipid-lowering therapy with the indication of secondary prevention for clinical ASCVD.  Recent Labs:  Lipid Panel (last 6 months):   Lab Results  Component Value Date   CHOL 112 09/27/2021   TRIG 62 09/27/2021   HDL 41 09/27/2021   CHOLHDL 2.7 09/27/2021   VLDL 12 09/27/2021   LDLCALC 59 09/27/2021    Hepatic function panel (last 6 months):   Lab Results  Component Value Date   AST 19 09/22/2021   ALT 9 09/22/2021   ALKPHOS 70 09/22/2021   BILITOT 0.4 09/22/2021    SCr (since admission):   Serum creatinine: 1.75 mg/dL (H) 09/27/21 0504 Estimated creatinine clearance: 23.8 mL/min (A)  Current therapy and lipid therapy tolerance Current lipid-lowering therapy: crestor 10mg /day Documented or reported allergies or intolerances to lipid-lowering therapies (if applicable): none  Assessment:   Patient agrees with changes to lipid-lowering therapy  Plan:    1.Statin intensity (high intensity recommended for all patients regardless of the LDL):  Add or increase statin to high intensity. -With renal function, crestor can not be increased further. Will change to atorvastatin  2.Add ezetimibe (if any one of the following):   Not indicated at this time.  3.Refer to lipid clinic:   No  4.Follow-up with:  Primary care provider - [name], MD  5.Follow-up labs after discharge: LDL at goal. Check lipids annually.  Hildred Laser, PharmD Clinical Pharmacist **Pharmacist phone directory can now be found on [email or web address] ([email or web address]).  Listed under [facility].

## 2021-09-27 NOTE — Progress Notes (Signed)
Lab called and report a critical hemoglobin of 6.6, MD notified.

## 2021-09-27 NOTE — Progress Notes (Signed)
° °  VASCULAR SURGERY ASSESSMENT & PLAN:   POD 1 RIGHT CEA: The patient is doing well status post right carotid endarterectomy.  She has no focal weakness or paresthesias.  She is on Plavix and is on a statin.  She is not on aspirin because of her history of GI bleeding.   ANEMIA: This patient has had a significant drop in her hemoglobin to 6.6 compared to her preop hemoglobin on 09/22/2021 of 9.7.  She has been having some melena and has had multiple previous GI bleeds in the past.  She is scheduled for upper and lower endoscopy on 10/03/2021.  We will need to notify Dr. Benson Norway of this drop in hemoglobin to be sure that he is okay delaying endoscopy until the 21st.  She will definitely need periprocedural antibiotics given her recent carotid endarterectomy.   SUBJECTIVE:   No specific complaints this morning.  She tells me she has been having some melena and has had multiple GI bleeds in the past.  She is scheduled for an upper and lower endoscopy on 10/03/2021.  PHYSICAL EXAM:   Vitals:   09/27/21 0122 09/27/21 0400 09/27/21 0527 09/27/21 0630  BP: (!) 119/39  (!) 127/39 (!) 127/39  Pulse: (!) 49  (!) 49   Resp: 17  20 20   Temp: 97.8 F (36.6 C)  97.8 F (36.6 C) 97.8 F (36.6 C)  TempSrc:  Oral  Oral  SpO2: 95%  95%   Weight:      Height:       No focal weakness or paresthesias. Her incision looks fine. Tongue is midline.  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

## 2021-09-27 NOTE — Progress Notes (Signed)
Talked with Dr. Benson Norway this morning.  He is going to see Miss. Hartford Poli as a consult.  He feels it is okay to continue Plavix.    Leontine Locket, [facility] 09/27/2021 10:41 AM

## 2021-09-28 ENCOUNTER — Inpatient Hospital Stay (HOSPITAL_COMMUNITY): Payer: Medicare Other | Admitting: Anesthesiology

## 2021-09-28 ENCOUNTER — Encounter (HOSPITAL_COMMUNITY)
Admission: RE | Disposition: A | Discharge status: Home / Self Care | Payer: Self-pay | Source: Home / Self Care | Attending: Vascular Surgery

## 2021-09-28 ENCOUNTER — Encounter (HOSPITAL_COMMUNITY): Payer: Self-pay | Admitting: Vascular Surgery

## 2021-09-28 DIAGNOSIS — D509 Iron deficiency anemia, unspecified: Secondary | ICD-10-CM

## 2021-09-28 DIAGNOSIS — K5730 Diverticulosis of large intestine without perforation or abscess without bleeding: Secondary | ICD-10-CM

## 2021-09-28 DIAGNOSIS — I2510 Atherosclerotic heart disease of native coronary artery without angina pectoris: Secondary | ICD-10-CM

## 2021-09-28 DIAGNOSIS — I110 Hypertensive heart disease with heart failure: Secondary | ICD-10-CM

## 2021-09-28 HISTORY — PX: ESOPHAGOGASTRODUODENOSCOPY (EGD) WITH PROPOFOL: SHX5813

## 2021-09-28 HISTORY — PX: COLONOSCOPY WITH PROPOFOL: SHX5780

## 2021-09-28 LAB — TYPE AND SCREEN
ABO/RH(D): A POS
Antibody Screen: NEGATIVE
Unit division: 0
Unit division: 0
Unit division: 0

## 2021-09-28 LAB — BPAM RBC
Blood Product Expiration Date: 202302152359
Blood Product Expiration Date: 202302202359
Blood Product Expiration Date: 202302202359
ISSUE DATE / TIME: 202302150902
ISSUE DATE / TIME: 202302151531
ISSUE DATE / TIME: 202302151557
Unit Type and Rh: 600
Unit Type and Rh: 6200
Unit Type and Rh: 6200

## 2021-09-28 SURGERY — ESOPHAGOGASTRODUODENOSCOPY (EGD) WITH PROPOFOL
Anesthesia: General

## 2021-09-28 MED ORDER — PROPOFOL 500 MG/50ML IV EMUL
INTRAVENOUS | Status: DC | PRN
  Administered 2021-09-28: 75 ug/kg/min via INTRAVENOUS

## 2021-09-28 MED ORDER — LACTATED RINGERS IV SOLN: INTRAVENOUS | Status: DC

## 2021-09-28 MED ORDER — EPHEDRINE SULFATE-NACL 50-0.9 MG/10ML-% IV SOSY
PREFILLED_SYRINGE | INTRAVENOUS | Status: DC | PRN
  Administered 2021-09-28: 10 mg via INTRAVENOUS

## 2021-09-28 MED ORDER — ADULT MULTIVITAMIN W/MINERALS CH
1.0000 | ORAL_TABLET | Freq: Every day | ORAL | Status: DC
  Administered 2021-09-28 – 2021-09-29 (×2): 1 via ORAL
  Filled 2021-09-28 (×2): qty 1

## 2021-09-28 MED ORDER — ENSURE ENLIVE PO LIQD
237.0000 mL | Freq: Three times a day (TID) | ORAL | Status: DC
  Administered 2021-09-29 (×2): 237 mL via ORAL

## 2021-09-28 MED ORDER — PROPOFOL 10 MG/ML IV BOLUS
INTRAVENOUS | Status: DC | PRN
Start: 2021-09-28 — End: 2021-09-28
  Administered 2021-09-28: 20 mg via INTRAVENOUS
  Administered 2021-09-28 (×2): 30 mg via INTRAVENOUS
  Administered 2021-09-28 (×3): 20 mg via INTRAVENOUS

## 2021-09-28 SURGICAL SUPPLY — 25 items

## 2021-09-28 NOTE — Progress Notes (Signed)
Patient arrived back to room 4E01 in NAD, VS stable. Patient resting on side of bed with call bell in reach.

## 2021-09-28 NOTE — Op Note (Signed)
[facility] Patient Name: [name] Procedure Date : 09/28/2021 MRN: [id number] Attending MD: Carol Ada , MD Date of Birth: Jul 10, 1941 CSN: [id number] Age: 81 Admit Type: Inpatient Procedure:                Upper GI endoscopy Indications:              Iron deficiency anemia, Heme positive stool Providers:                Carol Ada, MD, Burtis Junes, RN, Benetta Spar,                            Technician Referring MD:              Medicines:                Propofol per Anesthesia Complications:            No immediate complications. Estimated Blood Loss:     Estimated blood loss: none. Procedure:                Pre-Anesthesia Assessment:                           - Prior to the procedure, a History and Physical                            was performed, and patient medications and                            allergies were reviewed. The patient's tolerance of                            previous anesthesia was also reviewed. The risks                            and benefits of the procedure and the sedation                            options and risks were discussed with the patient.                            All questions were answered, and informed consent                            was obtained. Prior Anticoagulants: The patient has                            taken no previous anticoagulant or antiplatelet                            agents. ASA Grade Assessment: III - A patient with                            severe systemic disease. After reviewing the risks  and benefits, the patient was deemed in                            satisfactory condition to undergo the procedure.                           - Sedation was administered by an anesthesia                            professional. Deep sedation was attained.                           After obtaining informed consent, the endoscope was                            passed under direct  vision. Throughout the                            procedure, the patient's blood pressure, pulse, and                            oxygen saturations were monitored continuously. The                            [id number]-HQ190TL ([id number]) Olympus peds colonoscope was                            introduced through the mouth, and advanced to the                            fourth part of duodenum. The upper GI endoscopy was                            accomplished without difficulty. The patient                            tolerated the procedure well. Scope In: Scope Out: Findings:      The esophagus was normal.      The stomach was normal.      The examined duodenum was normal.      The prior tattoo site was noted in the duoednum. There was no evidence       of any active bleeding. Impression:               - Normal esophagus.                           - Normal stomach.                           - Normal examined duodenum.                           - No specimens collected. Recommendation:           - Proceed with the colonoscopy. Procedure Code(s):        ---  Professional ---                           [id number], Esophagogastroduodenoscopy, flexible,                            transoral; diagnostic, including collection of                            specimen(s) by brushing or washing, when performed                            (separate procedure) Diagnosis Code(s):        --- Professional ---                           D50.9, Iron deficiency anemia, unspecified                           R19.5, Other fecal abnormalities CPT copyright 2019 [employer]. All rights reserved. The codes documented in this report are preliminary and upon coder review may  be revised to meet current compliance requirements. Carol Ada, MD Carol Ada, MD 09/28/2021 3:02:56 PM This report has been signed electronically. Number of Addenda: 0

## 2021-09-28 NOTE — Op Note (Signed)
[facility] Patient Name: [name] Procedure Date : 09/28/2021 MRN: [id number] Attending MD: Carol Ada , MD Date of Birth: 1941/03/17 CSN: [id number] Age: 81 Admit Type: Inpatient Procedure:                Colonoscopy Indications:              Heme positive stool, Iron deficiency anemia Providers:                Carol Ada, MD, Burtis Junes, RN, Benetta Spar,                            Technician Referring MD:              Medicines:                Propofol per Anesthesia Complications:            No immediate complications. Estimated Blood Loss:     Estimated blood loss: none. Procedure:                Pre-Anesthesia Assessment:                           - Prior to the procedure, a History and Physical                            was performed, and patient medications and                            allergies were reviewed. The patient's tolerance of                            previous anesthesia was also reviewed. The risks                            and benefits of the procedure and the sedation                            options and risks were discussed with the patient.                            All questions were answered, and informed consent                            was obtained. Prior Anticoagulants: The patient has                            taken no previous anticoagulant or antiplatelet                            agents. ASA Grade Assessment: III - A patient with                            severe systemic disease. After reviewing the risks  and benefits, the patient was deemed in                            satisfactory condition to undergo the procedure.                           - Sedation was administered by an anesthesia                            professional. Deep sedation was attained.                           After obtaining informed consent, the colonoscope                            was passed under direct  vision. Throughout the                            procedure, the patient's blood pressure, pulse, and                            oxygen saturations were monitored continuously. The                            [id number]-HQ190TL ([id number] Olympus peds colonoscope was                            introduced through the anus and advanced to the the                            cecum, identified by appendiceal orifice and                            ileocecal valve. The colonoscopy was performed                            without difficulty. The patient tolerated the                            procedure well. The quality of the bowel                            preparation was evaluated using the BBPS [facility]                            Bowel Preparation Scale) with scores of: Right                            Colon = 2 (minor amount of residual staining, small                            fragments of stool and/or opaque liquid, but mucosa  seen well), Transverse Colon = 2 (minor amount of                            residual staining, small fragments of stool and/or                            opaque liquid, but mucosa seen well) and Left Colon                            = 2 (minor amount of residual staining, small                            fragments of stool and/or opaque liquid, but mucosa                            seen well). The total BBPS score equals 6. The                            quality of the bowel preparation was good. The                            ileocecal valve, appendiceal orifice, and rectum                            were photographed. Scope In: 2:26:42 PM Scope Out: 2:48:53 PM Scope Withdrawal Time: 0 hours 13 minutes 11 seconds  Total Procedure Duration: 0 hours 22 minutes 11 seconds  Findings:      Scattered small and large-mouthed diverticula were found in the sigmoid       colon. No active source or any suspicious sources of bleeding  identified. Impression:               - Diverticulosis in the sigmoid colon.                           - No specimens collected. Recommendation:           - Return patient to hospital ward for ongoing care.                           - Resume regular diet.                           - Continue present medications. Procedure Code(s):        --- Professional ---                           [phone], Colonoscopy, flexible; diagnostic, including                            collection of specimen(s) by brushing or washing,                            when performed (separate procedure) Diagnosis Code(s):        --- Professional ---  R19.5, Other fecal abnormalities                           D50.9, Iron deficiency anemia, unspecified                           K57.30, Diverticulosis of large intestine without                            perforation or abscess without bleeding CPT copyright 2019 [employer]. All rights reserved. The codes documented in this report are preliminary and upon coder review may  be revised to meet current compliance requirements. Carol Ada, MD Carol Ada, MD 09/28/2021 3:00:26 PM This report has been signed electronically. Number of Addenda: 0

## 2021-09-28 NOTE — Progress Notes (Addendum)
°  Progress Note    09/28/2021 7:13 AM 2 Days Post-Op  Subjective:  wanting to go to the bathroom  Afebrile HR 40's-50's 917'H-150'V systolic 69% RA  Vitals:   09/27/21 2303 09/28/21 0318  BP: (!) 123/56 (!) 135/36  Pulse: (!) 49 (!) 49  Resp: 16 20  Temp: 97.8 F (36.6 C) 98.1 F (36.7 C)  SpO2: 94% 91%     Physical Exam: Neuro:  in tact; tongue is midline; moving all extremities equally Lungs:  non labored Incision:  clean and dry  CBC    Component Value Date/Time   WBC 8.2 09/27/2021 2208   RBC 3.18 (L) 09/27/2021 2208   HGB 9.0 (L) 09/27/2021 2208   HGB 8.2 (L) 08/31/2021 1046   HGB 9.9 (L) 08/02/2017 0821   HCT 27.2 (L) 09/27/2021 2208   HCT 29.8 (L) 08/02/2017 0821   PLT 201 09/27/2021 2208   PLT 311 08/31/2021 1046   PLT 218 08/02/2017 0821   MCV 85.5 09/27/2021 2208   MCV 92.3 08/02/2017 0821   MCH 28.3 09/27/2021 2208   MCHC 33.1 09/27/2021 2208   RDW 18.9 (H) 09/27/2021 2208   RDW 14.4 08/02/2017 0821   LYMPHSABS 2.6 08/31/2021 1046   LYMPHSABS 2.6 08/02/2017 0821   MONOABS 0.8 08/31/2021 1046   MONOABS 0.5 08/02/2017 0821   EOSABS 0.4 08/31/2021 1046   EOSABS 0.2 08/02/2017 0821   BASOSABS 0.0 08/31/2021 1046   BASOSABS 0.0 08/02/2017 0821    BMET    Component Value Date/Time   NA 138 09/27/2021 0504   NA 139 08/02/2017 0821   K 4.4 09/27/2021 0504   K 4.3 08/02/2017 0821   CL 109 09/27/2021 0504   CO2 21 (L) 09/27/2021 0504   CO2 24 08/02/2017 0821   GLUCOSE 103 (H) 09/27/2021 0504   GLUCOSE 100 08/02/2017 0821   BUN 29 (H) 09/27/2021 0504   BUN 14.4 08/02/2017 0821   CREATININE 1.75 (H) 09/27/2021 0504   CREATININE 1.75 (H) 08/31/2021 1046   CREATININE 1.3 (H) 08/02/2017 0821   CALCIUM 8.2 (L) 09/27/2021 0504   CALCIUM 9.2 08/02/2017 0821   GFRNONAA 29 (L) 09/27/2021 0504   GFRNONAA 29 (L) 08/31/2021 1046   GFRAA 34 (L) 02/26/2020 0842     Intake/Output Summary (Last 24 hours) at 09/28/2021 0713 Last data filed at  09/27/2021 1849 Gross per 24 hour  Intake 2215.69 ml  Output --  Net 2215.69 ml     Assessment/Plan:  This is a 81 y.o. female who is s/p right CEA 2 Days Post-Op  -pt is doing well this am. -pt neuro exam is in tact -hgb improved to 9 today after 2 units PRBC's.  Dr. Benson Norway taking pt for endoscopy today. Continue Plavix if ok with Dr. Benson Norway.  Home when ok with GI.   Leontine Locket, PA-C [facility] [phone]  I have independently interviewed and examined patient and agree with PA assessment and plan above.   Monifa Blanchette C. Donzetta Matters, MD [facility] [place] Office: [phone] Pager: [phone]

## 2021-09-28 NOTE — Transfer of Care (Signed)
Immediate Anesthesia Transfer of Care Note  Patient: [name]  Procedure(s) Performed: ESOPHAGOGASTRODUODENOSCOPY (EGD) WITH PROPOFOL COLONOSCOPY WITH PROPOFOL  Patient Location: Endoscopy Unit  Anesthesia Type:MAC  Level of Consciousness: drowsy and patient cooperative  Airway & Oxygen Therapy: Patient Spontanous Breathing  Post-op Assessment: Report given to RN and Post -op Vital signs reviewed and stable  Post vital signs: Reviewed and stable  Last Vitals:  Vitals Value Taken Time  BP 147/33 09/28/21 1455  Temp    Pulse 57 09/28/21 1456  Resp 22 09/28/21 1456  SpO2 98 % 09/28/21 1456  Vitals shown include unvalidated device data.  Last Pain:  Vitals:   09/28/21 1455  TempSrc:   PainSc: 0-No pain      Patients Stated Pain Goal: 0 (35/46/56 8127)  Complications: No notable events documented.

## 2021-09-28 NOTE — Anesthesia Preprocedure Evaluation (Addendum)
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 on medications and Pt. 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  Ost 1st Diag lesion is 100% stenosed.           Prox RCA lesion is 20% stenosed.  Mid RCA lesion is 30% stenosed.  Mid LM to Dist LM lesion is 40% stenosed.  Ost Ramus lesion is 40% stenosed.  Ost Cx lesion is 40% stenosed.  Origin lesion is 100% stenosed.  Origin to Prox Graft lesion is 100% stenosed.  The left ventricular systolic function is normal.  LV end diastolic pressure is normal.  The left ventricular ejection fraction is 50-55% by visual estimate.    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  Glaucoma OD TIAnegative psych ROS   GI/Hepatic Neg liver ROS, hiatal hernia, GERD  Medicated,  Endo/Other  negative endocrine ROS  Renal/GU Renal InsufficiencyRenal disease  negative genitourinary   Musculoskeletal negative musculoskeletal ROS (+)   Abdominal   Peds negative pediatric ROS (+)  Hematology  (+) Blood dyscrasia,  anemia , Plavix therapy- last dose 09/26/21   Anesthesia Other Findings   Reproductive/Obstetrics negative OB ROS                             Anesthesia Physical  Anesthesia Plan  ASA: 3  Anesthesia Plan: General   Post-op Pain Management:    Induction: Intravenous  PONV Risk Score and Plan: 4 or greater and Treatment may vary due to age or medical condition, Ondansetron, Dexamethasone and Diphenhydramine  Airway Management Planned: Oral ETT  Additional Equipment: Arterial line  Intra-op Plan:   Post-operative Plan: Extubation in OR  Informed Consent: I have reviewed the patients History and Physical, chart, labs and discussed the procedure including the risks, benefits and alternatives for the proposed anesthesia with the patient or authorized representative who has indicated his/her understanding and acceptance.     Dental advisory given  Plan Discussed with: Anesthesiologist and CRNA  Anesthesia Plan Comments:         Anesthesia Quick Evaluation

## 2021-09-28 NOTE — Progress Notes (Signed)
No source of bleeding was identified during these procedures.  Follow HGB and transfuse as necessary.  Continue with anticoagulation.  She can follow up in the office in 2-4 weeks.  Signing off.

## 2021-09-28 NOTE — Anesthesia Postprocedure Evaluation (Signed)
Anesthesia Post Note  Patient: [name]  Procedure(s) Performed: ESOPHAGOGASTRODUODENOSCOPY (EGD) WITH PROPOFOL COLONOSCOPY WITH PROPOFOL     Patient location during evaluation: PACU Anesthesia Type: MAC Level of consciousness: patient cooperative, oriented and sedated Pain management: pain level controlled Vital Signs Assessment: post-procedure vital signs reviewed and stable Respiratory status: spontaneous breathing, nonlabored ventilation, respiratory function stable and patient connected to nasal cannula oxygen Cardiovascular status: blood pressure returned to baseline and stable Postop Assessment: no apparent nausea or vomiting Anesthetic complications: no   No notable events documented.  Last Vitals:  Vitals:   09/28/21 1515 09/28/21 1540  BP: (!) 169/36 (!) 156/39  Pulse: (!) 53 (!) 54  Resp: (!) 21 20  Temp:  36.7 C  SpO2: 93% 94%    Last Pain:  Vitals:   09/28/21 1540  TempSrc: Oral  PainSc:                  Gibson Telleria,E. Doyce Saling

## 2021-09-28 NOTE — Progress Notes (Signed)
Initial Nutrition Assessment  DOCUMENTATION CODES:  Non-severe (moderate) malnutrition in context of chronic illness  INTERVENTION:  Advance diet as medically able and as tolerated.  Add Ensure Plus High Protein po TID, each supplement provides 350 kcal and 20 grams of protein.   Add MVI with minerals daily.  Obtain updated measured weight.  Encourage PO and supplement intake.  NUTRITION DIAGNOSIS:  Moderate Malnutrition related to chronic illness (COPD) as evidenced by moderate fat depletion, moderate muscle depletion.  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 2/14 - R carotid endarterectomy; R patch angioplasty  Endoscopy planned for today. Pt currently NPO for this procedure.  Spoke with pt at bedside. Pt reports eating pretty well, despite having very little appetite. She reports that she eats consistently throughout the day, and snacks sometimes as well.  Per Epic meal documentation, pt ate 100% of breakfast and lunch yesterday.   She is unsure of any changes in her weight.  Per Epic, pt has lost ~7 lbs (4.4%) in the past 4 days, which would be significant and severe. However, pt's weight appears stated due to exactness.   RD to order new measured weight to determine any changes.  Of note, pt reports an allergy to Vitamin D analogs. This would not be possible given the body synthesizes Vitamin D naturally through the skin. RD to order MVI and Ensure to aid in intake.  Medications: reviewed; colace, Lasix, Mag-Ox, Protonix, IVF  Labs:  reviewed; Glucose 103 (H)  NUTRITION - FOCUSED PHYSICAL EXAM: Flowsheet Row Most Recent Value  Orbital Region Moderate depletion  Upper Arm Region Mild depletion  Thoracic and Lumbar Region Moderate depletion  Buccal Region Severe depletion  Temple Region Mild depletion  Clavicle Bone Region Mild depletion  Clavicle and Acromion Bone Region Mild depletion  Scapular Bone Region No depletion  Dorsal Hand Moderate depletion  Patellar Region Moderate depletion  Anterior Thigh Region Mild depletion  Posterior Calf Region Moderate depletion  Edema (RD Assessment) None  Hair Reviewed  Eyes Reviewed  Mouth Reviewed  Skin Reviewed  Nails Reviewed   Diet Order:   Diet Order             Diet NPO time specified  Diet effective midnight                  EDUCATION NEEDS:  Education needs have been addressed  Skin:  Skin Assessment: Skin Integrity Issues: Skin Integrity Issues:: Incisions Incisions: R neck, closed (2/14)  Last BM:  09/28/21 - Type 7, medium  Height:  Ht Readings from Last 1 Encounters:  09/26/21 5\' 2"  (1.575 m)   Weight:  Wt Readings from Last 1 Encounters:  09/26/21 67.1 kg   BMI:  Body mass index is 27.07 kg/m.  Estimated Nutritional Needs:  Kcal:  1800-2000 Protein:  95-110 grams Fluid:  >1.8 L  Derrel Nip, RD, LDN (she/her/hers) Clinical Inpatient Dietitian RD Pager/After-Hours/Weekend Pager # in [place]

## 2021-09-28 NOTE — Anesthesia Procedure Notes (Signed)
Procedure Name: MAC Date/Time: 09/28/2021 2:00 PM Performed by: Jenne Campus, CRNA Pre-anesthesia Checklist: Patient identified, Emergency Drugs available, Suction available and Patient being monitored Oxygen Delivery Method: Nasal cannula

## 2021-09-29 ENCOUNTER — Encounter: Payer: Self-pay | Admitting: Physician Assistant

## 2021-09-29 ENCOUNTER — Other Ambulatory Visit (HOSPITAL_COMMUNITY): Payer: Self-pay

## 2021-09-29 ENCOUNTER — Encounter: Payer: Self-pay | Admitting: Internal Medicine

## 2021-09-29 DIAGNOSIS — E440 Moderate protein-calorie malnutrition: Secondary | ICD-10-CM | POA: Insufficient documentation

## 2021-09-29 LAB — CBC
HCT: 29.6 % — ABNORMAL LOW (ref 36.0–46.0)
Hemoglobin: 9.2 g/dL — ABNORMAL LOW (ref 12.0–15.0)
MCH: 27.9 pg (ref 26.0–34.0)
MCHC: 31.1 g/dL (ref 30.0–36.0)
MCV: 89.7 fL (ref 80.0–100.0)
Platelets: 199 10*3/uL (ref 150–400)
RBC: 3.3 MIL/uL — ABNORMAL LOW (ref 3.87–5.11)
RDW: 18.9 % — ABNORMAL HIGH (ref 11.5–15.5)
WBC: 7.7 10*3/uL (ref 4.0–10.5)
nRBC: 0 % (ref 0.0–0.2)

## 2021-09-29 LAB — POCT ACTIVATED CLOTTING TIME: Activated Clotting Time: 275 seconds

## 2021-09-29 MED ORDER — ATORVASTATIN CALCIUM 40 MG PO TABS
40.0000 mg | ORAL_TABLET | Freq: Every day | ORAL | 6 refills | Status: AC
  Filled 2021-09-29: qty 30, 30d supply, fill #0

## 2021-09-29 NOTE — TOC Transition Note (Addendum)
Transition of Care [facility]) - CM/SW Discharge Note   Patient Details  Name: [name] MRN: [id number] Date of Birth: 1941/02/22  [facility] CM/SW Contact:  Zenon Mayo, RN Phone Number: 09/29/2021, 10:45 AM   Clinical Narrative:    Patient is for dc today, NCM received call from Staff RN stating that patient may need oxygen for home. NCM awaiting ambulatory sats and order, trying to see if we can get her qualified. Patient stayed at 91%, so did not qualify for continuous oxygen.  Will have [employer] to check her oxygen overnight at home , overnight pulse oximetry at room air ordered and oxygen order is in for nocturnal oxygen.         Patient Goals and CMS Choice        Discharge Placement                       Discharge Plan and Services                                     Social Determinants of Health (SDOH) Interventions     Readmission Risk Interventions No flowsheet data found.

## 2021-09-29 NOTE — Care Management Important Message (Signed)
Important Message  Patient Details  Name: [name] MRN: [id number] Date of Birth: [birth year]   Medicare Important Message Given:  Yes     Shelda Altes 09/29/2021, 9:29 AM

## 2021-09-29 NOTE — Progress Notes (Signed)
Mobility Specialist Progress Note   09/29/21 1230  Mobility  Activity Ambulated with assistance in hallway  Level of Assistance Contact guard assist, steadying assist  Assistive Device Front wheel walker  Distance Ambulated (ft) 194 ft  Activity Response Tolerated well  $Mobility charge 1 Mobility   Received pt in bed w/ no complaints and agreeable to saturation qualification test. Upon laying supine and sitting EOB, on RA pt remained >93% SpO2. No Physical assist needed for mobility but contact guard provided for safety. While ambulating pt remained >90% SpO2 throughout but required x2 standing rest breaks d/t fatigue and slight SOB, pt shortly recovered after demonstrating pursed lip breathing. Returned BTB w/ no further complaint and all needs met.  Holland Falling Mobility Specialist Phone Number [phone]

## 2021-09-29 NOTE — Progress Notes (Signed)
Pt discharged home with family. AVS reviewed with pt and all questions answered. IV and telemetry box removed. Pt left with all of her belongings. Discharged via wheelchair and was accompanied by a Wilfrid Lund.

## 2021-09-29 NOTE — Plan of Care (Signed)
°  Problem: Education: Goal: Knowledge of General Education information will improve Description: Including pain rating scale, medication(s)/side effects and non-pharmacologic comfort measures Outcome: Adequate for Discharge   Problem: Health Behavior/Discharge Planning: Goal: Ability to manage health-related needs will improve Outcome: Adequate for Discharge   Problem: Clinical Measurements: Goal: Ability to maintain clinical measurements within normal limits will improve Outcome: Adequate for Discharge Goal: Will remain free from infection Outcome: Adequate for Discharge Goal: Diagnostic test results will improve Outcome: Adequate for Discharge Goal: Respiratory complications will improve Outcome: Adequate for Discharge Goal: Cardiovascular complication will be avoided Outcome: Adequate for Discharge   Problem: Activity: Goal: Risk for activity intolerance will decrease Outcome: Adequate for Discharge   Problem: Nutrition: Goal: Adequate nutrition will be maintained Outcome: Adequate for Discharge   Problem: Coping: Goal: Level of anxiety will decrease Outcome: Adequate for Discharge   Problem: Elimination: Goal: Will not experience complications related to bowel motility Outcome: Adequate for Discharge Goal: Will not experience complications related to urinary retention Outcome: Adequate for Discharge   Problem: Pain Managment: Goal: General experience of comfort will improve Outcome: Adequate for Discharge   Problem: Safety: Goal: Ability to remain free from injury will improve Outcome: Adequate for Discharge   Problem: Skin Integrity: Goal: Risk for impaired skin integrity will decrease Outcome: Adequate for Discharge   Problem: Education: Goal: Knowledge of disease or condition will improve Outcome: Adequate for Discharge Goal: Knowledge of secondary prevention will improve (SELECT ALL) Outcome: Adequate for Discharge Goal: Knowledge of patient specific  risk factors will improve (INDIVIDUALIZE FOR PATIENT) Outcome: Adequate for Discharge Goal: Individualized Educational Video(s) Outcome: Adequate for Discharge   Problem: Coping: Goal: Will verbalize positive feelings about self Outcome: Adequate for Discharge Goal: Will identify appropriate support needs Outcome: Adequate for Discharge   Problem: Health Behavior/Discharge Planning: Goal: Ability to manage health-related needs will improve Outcome: Adequate for Discharge   Problem: Self-Care: Goal: Ability to participate in self-care as condition permits will improve Outcome: Adequate for Discharge Goal: Verbalization of feelings and concerns over difficulty with self-care will improve Outcome: Adequate for Discharge Goal: Ability to communicate needs accurately will improve Outcome: Adequate for Discharge   Problem: Malnutrition  (NI-5.2) Goal: Food and/or nutrient delivery Description: Individualized approach for food/nutrient provision. Outcome: Adequate for Discharge

## 2021-09-29 NOTE — Discharge Summary (Signed)
Discharge Summary     [name] Mar 31, 1941 81 y.o. 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 female who has hx of left CEA by Dr. Scot Dock in 2011.  He has been following a moderate right carotid stenosis.  Since I saw her last, she denies any history of stroke, TIAs, expressive or receptive aphasia, or amaurosis fugax.  She is on Plavix but is not on aspirin.  This may be because of her history of anemia.  She is also on a statin.  She quit smoking in 2002.   She also has a history of peripheral arterial disease.  She has stable claudication of both legs with no rest pain or nonhealing ulcers.   She has a history of anemia and is scheduled for an iron injection next Tuesday.   She does have a history of previous coronary revascularization but has had no recent chest pain or chest pressure.  She has some mild dyspnea on exertion related to her COPD.  She quit smoking in 2002.   She had a left lower lobe pneumonia treated with radiation therapy and is due for a follow-up study in 6 months.  Hospital Course:  The patient was admitted to the hospital and taken to the operating room on 09/26/2021 - 09/28/2021 and underwent right CEA with bovine pericardial patch.      Findings: Long segment 90% right carotid stenosis.  The pt tolerated the procedure well and was transported to the PACU in good condition.   By POD 1, the pt neuro status was in tact.  She did have an acute blood loss anemia and given her hx of GIB, Dr. Benson Norway was consulted as she relayed to Dr. Scot Dock she had been having some melena and was scheduled for endoscopy on 10/03/2021.  She is on Plavix.    POD 2, pt continued to be neuro in tact.  She was taken for endoscopy and no source of bleeding was identified during the  procedures.  Okay to continue with Plavix and she will follow up with him in 3 weeks.   POD 3, recheck of hgb was stable, she was neuro in tact.  She did have dips in her oxygen saturations at night and night time O2 was arranged.  She did have bradycardia post op that she was tolerating.  She was not restarted on her CCB and instructed to f/u with her PCP or cardiology for follow up.   She is discharged home.   Recent Labs    09/27/21 0504  NA 138  K 4.4  CL 109  CO2 21*  GLUCOSE 103*  BUN 29*  CALCIUM 8.2*   Recent Labs    09/27/21 2208 09/29/21 0807  WBC 8.2 7.7  HGB 9.0* 9.2*  HCT 27.2* 29.6*  PLT 201 199   No results for input(s): INR in the last 72 hours.   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 09/26/2021   Iron deficiency anemia due to chronic blood loss 81/19/1478   Acute diastolic CHF (congestive heart failure) ([place]) 07/03/2020   Symptomatic anemia 07/02/2020   TIA (transient ischemic attack) 02/11/2019   Headache syndrome 11/27/2018   GI bleed 07/15/2018   Occult blood positive stool 07/14/2018   History of shingles 06/23/2018   Neuralgia and neuritis 06/23/2018   Costochondritis, acute 04/18/2018   Hyperkalemia 02/06/2017   Physical deconditioning 11/08/2016   Acute lower GI bleeding    BRBPR (bright red blood per rectum) 09/15/2016   Iron deficiency anemia 06/07/2016   Bronchitis, acute 06/07/2016   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   Primary cancer of right lower lobe of lung ([place]) 04/25/2016   S/P  CABG x 3 10/28/2015   Foot pain, right 10/24/2015   CAP (community acquired pneumonia) 10/22/2015   UTI (urinary tract infection) 10/22/2015   COPD exacerbation ([place]) 10/22/2015   Neck pain on right side 01/05/2015   Chest pain 08/02/2014   Intractable headache 08/02/2014   HLD (hyperlipidemia) 08/02/2014   Essential hypertension 08/02/2014   GERD (gastroesophageal reflux disease) 08/02/2014   Leg pain, right 08/02/2014   HA (headache)    Carotid artery stenosis 12/09/2013   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   Dehydration 04/02/2013   CAD (coronary artery disease) 04/02/2013   AKI (acute kidney injury) ([place]) 04/01/2013   Nausea & vomiting 04/01/2013   Diarrhea 04/01/2013   Hypokalemia 04/01/2013   Hyponatremia 04/01/2013   Peripheral vascular disease ([place]) 01/07/2013   Occlusion and stenosis of carotid artery without mention of cerebral infarction 01/07/2013   Dizziness and giddiness 01/07/2013   Shortness of breath 01/07/2013   Cough 11/17/2012   COPD GOLD II 11/17/2012   Past Medical History:  Diagnosis Date   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[place])    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   Asthma    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   Blood in stool    last week of aug 2018   Carotid artery occlusion    Chronic kidney disease    Complication of anesthesia    COPD (chronic obstructive pulmonary disease) ([place])    Coronary artery disease    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   Diverticulitis    Dyspnea    with exertion   Gastroesophageal reflux disease    occ   Glaucoma    right eye   Headache    tension   Headache syndrome 11/27/2018   Hiatal hernia    Hyperlipidemia    Hypertension    Iliac artery aneurysm (H[facility])    Lung cancer ([place]) dx 2018   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 Terrence Dupont advised patient not to take this medication   Doxycycline Swelling   Mouth,  lips, feet swelling   Hydromorphone Palpitations, Other (See Comments)   DILAUDID  -  Pt had a Heart Attack after taking Dilaudid.   Levaquin [levofloxacin] Other (See Comments)   Chest pressure, SOB, "pain in between shoulder blades", sweaty -as reported by patient per experience in ED this afternoon   Vitamin D Analogs Swelling   Avelox [moxifloxacin Hcl In Nacl] Palpitations   Caused Heart Attack    Oxycodone-acetaminophen Other (See Comments)   Says it makes her feel weird   Risedronate Other (See Comments)   Chest pain        Medication List     STOP taking these medications    NIFEdipine 30 MG 24 hr tablet Commonly known as: PROCARDIA-XL/NIFEDICAL-XL   rosuvastatin 10 MG tablet Commonly known as: CRESTOR       TAKE these medications    acetaminophen 500 MG tablet Commonly known as: TYLENOL Take 500-1,000 mg by mouth every 6 (six) hours as needed (pain).   albuterol 108 (90 Base) MCG/ACT inhaler Commonly known as: VENTOLIN HFA Inhale 2 puffs into the lungs every 6 (six) hours as needed for wheezing or shortness of breath.   atorvastatin 40 MG tablet Commonly known as: LIPITOR Take 1 tablet (40 mg total) by mouth at bedtime.   cetirizine 10 MG tablet Commonly known as: ZYRTEC Take 10 mg by mouth daily as needed for allergies.   clopidogrel 75 MG tablet Commonly known as: PLAVIX Take 1 tablet (75 mg total) by mouth daily. What changed: when to take this   dexlansoprazole 60 MG  capsule Commonly known as: Dexilant Take 1 capsule (60 mg total) by mouth daily before breakfast.   furosemide 40 MG tablet Commonly known as: LASIX Take 40 mg by mouth daily.   magnesium oxide 400 (241.3 Mg) MG tablet Commonly known as: MAG-OX Take 400 mg by mouth in the morning.   nitroGLYCERIN 0.4 MG SL tablet Commonly known as: NITROSTAT Place 0.4 mg under the tongue every 5 (five) minutes x 3 doses as needed for chest pain.   olmesartan 20 MG tablet Commonly known as: Benicar Take 1 tablet (20 mg total) by mouth daily. What changed:  how much to take when to take this   ondansetron 4 MG disintegrating tablet Commonly known as: ZOFRAN-ODT Take 4 mg by mouth every 8 (eight) hours as needed for nausea or vomiting.   Trelegy Ellipta 200-62.5-25 MCG/ACT Aepb Generic drug: Fluticasone-Umeclidin-Vilant Inhale 1 puff into the lungs daily.               Durable Medical Equipment  (From admission, onward)           Start     Ordered   09/29/21 1338  For home use only DME oxygen  Once       Question Answer Comment  Length of Need Lifetime   Mode or (Route) Nasal cannula   Liters per Minute 2   Frequency Only at night (stationary unit needed)   Oxygen conserving device Yes   Oxygen delivery system Gas      09/29/21 1340   09/29/21 1323  For home use only DME Other see comment  Once       Comments: Overnight pulse oximetry on Room Air  Question:  Length of Need  Answer:  Lifetime   09/29/21 1322             [facility] Discharge Instructions Carotid Endarterectomy (CEA)  Please refer to the  following instructions for your post-procedure care. Your surgeon or physician assistant will discuss any changes with you.  Activity  You are encouraged to walk as much as you can. You can slowly return to normal activities but must avoid strenuous activity and heavy lifting until your doctor tell you it's OK. Avoid activities  such as vacuuming or swinging a golf club. You can drive after one week if you are comfortable and you are no longer taking prescription pain medications. It is normal to feel tired for serval weeks after your surgery. It is also normal to have difficulty with sleep habits, eating, and bowel movements after surgery. These will go away with time.  Bathing/Showering  You may shower after you come home. Do not soak in a bathtub, hot tub, or swim until the incision heals completely.  Incision Care  Shower every day. Clean your incision with mild soap and water. Pat the area dry with a clean towel. You do not need a bandage unless otherwise instructed. Do not apply any ointments or creams to your incision. You may have skin glue on your incision. Do not peel it off. It will come off on its own in about one week. Your incision may feel thickened and raised for several weeks after your surgery. This is normal and the skin will soften over time. For Men Only: It's OK to shave around the incision but do not shave the incision itself for 2 weeks. It is common to have numbness under your chin that could last for several months.  Diet  Resume your normal diet. There are no special food restrictions following this procedure. A low fat/low cholesterol diet is recommended for all patients with vascular disease. In order to heal from your surgery, it is CRITICAL to get adequate nutrition. Your body requires vitamins, minerals, and protein. Vegetables are the best source of vitamins and minerals. Vegetables also provide the perfect balance of protein. Processed food has little nutritional value, so try to avoid this.  Medications  Resume taking all of your medications unless your doctor or physician assistant tells you not to.  If your incision is causing pain, you may take over-the- counter pain relievers such as acetaminophen (Tylenol). If you were prescribed a stronger pain medication, please be aware these  medications can cause nausea and constipation.  Prevent nausea by taking the medication with a snack or meal. Avoid constipation by drinking plenty of fluids and eating foods with a high amount of fiber, such as fruits, vegetables, and grains.  Do not take Tylenol if you are taking prescription pain medications.  Follow Up  Our office will schedule a follow up appointment 2-3 weeks following discharge.  Please call us immediately for any of the following conditions  Increased pain, redness, drainage (pus) from your incision site. Fever of 101 degrees or higher. If you should develop stroke (slurred speech, difficulty swallowing, weakness on one side of your body, loss of vision) you should call 911 and go to the nearest emergency room.  Reduce your risk of vascular disease:  Stop smoking. If you would like help call [facility] at 1-800-QUIT-NOW 863-442-3818) or [place] at ([phone]. Manage your cholesterol Maintain a desired weight Control your diabetes Keep your blood pressure down  If you have any questions, please call the office at [phone].  Prescriptions given: 1.   Lipitor 40mg  daily #30 six refills Pt taking Tylenol for pain.  No narcotic given.  Disposition: home  Patient's condition: is Good  Follow up: 1. [facility] in 2-3 weeks  2. Dr. Coletta Memos or Dr. Terrence Dupont as she is instructed to hold her CCB due to bradycardia s/p CEA 3. Dr. Benson Norway in 2-4 weeks for follow up.   Leontine Locket, PA-C [facility] [phone]   --- For [facility] use ---   Modified Rankin score at D/C (0-6): 0  IV medication needed for:  1. Hypertension: No 2. Hypotension: No  Post-op Complications: No  1. Post-op CVA or TIA: No  If yes: Event classification (right eye, left eye, right cortical, left cortical, verterobasilar, other): n/a  If yes: Timing of event (intra-op, <6 hrs post-op, >=6 hrs post-op, unknown): n/a  2. CN injury: No  If yes: CN n/a  injuried   3. Myocardial infarction: No  If yes: Dx by (EKG or clinical, Troponin): n/a  4.  CHF: No  5.  Dysrhythmia (new): No  6. Wound infection: No  7. Reperfusion symptoms: No  8. Return to OR: No  If yes: return to OR for (bleeding, neurologic, other CEA incision, other): n/a  Discharge medications: Statin use:  Yes ASA use:  No   Beta blocker use:  No ACE-Inhibitor use:  No  ARB use:  Yes CCB use: No P2Y12 Antagonist use: Yes, [ x] Plavix, [ ]  Plasugrel, [ ]  Ticlopinine, [ ]  Ticagrelor, [ ]  Other, [ ]  No for medical reason, [ ]  Non-compliant, [ ]  Not-indicated Anti-coagulant use:  No, [ ]  Warfarin, [ ]  Rivaroxaban, [ ]  Dabigatran,

## 2021-09-29 NOTE — Progress Notes (Signed)
SATURATION QUALIFICATIONS: (This note is used to comply with regulatory documentation for home oxygen)  Patient Saturations on Room Air at Rest = 94%  Patient Saturations on Room Air while Ambulating = 91%  Patient Saturations on N/A Liters of oxygen while Ambulating = N/A  Please briefly explain why patient needs home oxygen: N/A

## 2021-09-29 NOTE — Progress Notes (Addendum)
°  Progress Note    09/29/2021 6:47 AM  Subjective:  sitting on side of bed.  Says she feels better.  Has a sore throat but no trouble swallowing.  Says she is ready to go home.  Afebrile HR 40's-50's 341'D-622'W systolic 97% RA  Vitals:   09/29/21 0310 09/29/21 0313  BP: (!) 149/37   Pulse: (!) 49   Resp: 20 20  Temp: 98.5 F (36.9 C)   SpO2: 98%      Physical Exam: Neuro:  intact Lungs:  non labored Incision:  clean and dry  CBC    Component Value Date/Time   WBC 8.2 09/27/2021 2208   RBC 3.18 (L) 09/27/2021 2208   HGB 9.0 (L) 09/27/2021 2208   HGB 8.2 (L) 08/31/2021 1046   HGB 9.9 (L) 08/02/2017 0821   HCT 27.2 (L) 09/27/2021 2208   HCT 29.8 (L) 08/02/2017 0821   PLT 201 09/27/2021 2208   PLT 311 08/31/2021 1046   PLT 218 08/02/2017 0821   MCV 85.5 09/27/2021 2208   MCV 92.3 08/02/2017 0821   MCH 28.3 09/27/2021 2208   MCHC 33.1 09/27/2021 2208   RDW 18.9 (H) 09/27/2021 2208   RDW 14.4 08/02/2017 0821   LYMPHSABS 2.6 08/31/2021 1046   LYMPHSABS 2.6 08/02/2017 0821   MONOABS 0.8 08/31/2021 1046   MONOABS 0.5 08/02/2017 0821   EOSABS 0.4 08/31/2021 1046   EOSABS 0.2 08/02/2017 0821   BASOSABS 0.0 08/31/2021 1046   BASOSABS 0.0 08/02/2017 0821    BMET    Component Value Date/Time   NA 138 09/27/2021 0504   NA 139 08/02/2017 0821   K 4.4 09/27/2021 0504   K 4.3 08/02/2017 0821   CL 109 09/27/2021 0504   CO2 21 (L) 09/27/2021 0504   CO2 24 08/02/2017 0821   GLUCOSE 103 (H) 09/27/2021 0504   GLUCOSE 100 08/02/2017 0821   BUN 29 (H) 09/27/2021 0504   BUN 14.4 08/02/2017 0821   CREATININE 1.75 (H) 09/27/2021 0504   CREATININE 1.75 (H) 08/31/2021 1046   CREATININE 1.3 (H) 08/02/2017 0821   CALCIUM 8.2 (L) 09/27/2021 0504   CALCIUM 9.2 08/02/2017 0821   GFRNONAA 29 (L) 09/27/2021 0504   GFRNONAA 29 (L) 08/31/2021 1046   GFRAA 34 (L) 02/26/2020 0842     Intake/Output Summary (Last 24 hours) at 09/29/2021 0647 Last data filed at 09/29/2021  0600 Gross per 24 hour  Intake 600 ml  Output 300 ml  Net 300 ml     Assessment/Plan:  This is a 81 y.o. female who is s/p right CEA 3 days post op  -pt is doing well this am.  Stat CBC ordered for this am.  Pt with bradycardia-this has been unchanged since CEA and BP is 989'Q-119'E systolic Endoscopy yesterday revealed no source for anemia.   -pt neuro exam is in tact -pt has ambulated -pt has voided -f/u with [facility] in 2-3 weeks    Leontine Locket, PA-C [facility] [phone]  I have independently interviewed and examined patient and agree with PA assessment and plan above. Hold procardia and f/u with pcp. Standard cea f/u in our office will be arranged.   Tae Robak C. Donzetta Matters, MD [facility] [place] Office: [phone] Pager: [phone]

## 2021-10-01 ENCOUNTER — Telehealth: Payer: Self-pay

## 2021-10-01 NOTE — Telephone Encounter (Signed)
Returned patient's call to answer questions re home O2. Patient set up to be assessed for home nocturnal oxygen at discharge [place], and she states she has not heard from them yet. Spoke w [employer], jasmine, who confirms that their Respiratory Team got the order Friday and will contact her early Monday to set up overnight eval. Patient updated and aware of plan. Questions answered to her satisfaction. No other needs identified.

## 2021-10-09 ENCOUNTER — Telehealth: Payer: Self-pay | Admitting: *Deleted

## 2021-10-09 NOTE — Telephone Encounter (Signed)
Patient called to verify she could keep her eye appointment Friday, 10/13/2021.  I confirmed that it is ok to have the appointment.

## 2021-10-17 NOTE — Progress Notes (Signed)
?POST OPERATIVE OFFICE NOTE ? ? ? ?CC:  F/u for surgery ? ?HPI:  This is a 81 y.o. female who is s/p right CEA on 09/26/2021 by Dr. Scot Dock for asymptomatic right carotid artery stenosis.  Her last duplex revealed normal left carotid artery.  She has hx of left CEA 03/28/2010 also by Dr. Scot Dock. ? ?Pt returns today for follow up.  Pt states she has been doing well.  She denies any visual changes, speech difficulties or sudden temporary unilateral weakness or paralysis.  She is not having any difficulty swallowing.  She states that she is sore up under her jaw bone and she gets some soreness in her neck around her incision but this has continued to improve.  Her procardia was held in the hospital and she states they are still holding that.   ? ?Allergies  ?Allergen Reactions  ? Azithromycin Swelling  ?  Patient reported past history of lip swelling  ? Codeine Other (See Comments)  ?  Dr. Terrence Dupont advised patient not to take this medication  ? Doxycycline Swelling  ?  Mouth, lips, feet swelling  ? Hydromorphone Palpitations and Other (See Comments)  ?  DILAUDID  -  Pt had a Heart Attack after taking Dilaudid.  ? Levaquin [Levofloxacin] Other (See Comments)  ?  Chest pressure, SOB, "pain in between shoulder blades", sweaty -as reported by patient per experience in ED this afternoon  ? Vitamin D Analogs Swelling  ? Avelox [Moxifloxacin Hcl In Nacl] Palpitations  ?  Caused Heart Attack   ? Oxycodone-Acetaminophen Other (See Comments)  ?  Says it makes her feel weird  ? Risedronate Other (See Comments)  ?  Chest pain  ? ? ?Current Outpatient Medications  ?Medication Sig Dispense Refill  ? acetaminophen (TYLENOL) 500 MG tablet Take 500-1,000 mg by mouth every 6 (six) hours as needed (pain).    ? albuterol (VENTOLIN HFA) 108 (90 Base) MCG/ACT inhaler Inhale 2 puffs into the lungs every 6 (six) hours as needed for wheezing or shortness of breath. 8 g 6  ? atorvastatin (LIPITOR) 40 MG tablet Take 1 tablet (40 mg total) by  mouth at bedtime. 30 tablet 6  ? cetirizine (ZYRTEC) 10 MG tablet Take 10 mg by mouth daily as needed for allergies.     ? clopidogrel (PLAVIX) 75 MG tablet Take 1 tablet (75 mg total) by mouth daily. (Patient taking differently: Take 75 mg by mouth in the morning.) 90 tablet 3  ? dexlansoprazole (DEXILANT) 60 MG capsule Take 1 capsule (60 mg total) by mouth daily before breakfast. 30 capsule 11  ? Fluticasone-Umeclidin-Vilant (TRELEGY ELLIPTA) 200-62.5-25 MCG/ACT AEPB Inhale 1 puff into the lungs daily. 180 each 3  ? furosemide (LASIX) 40 MG tablet Take 40 mg by mouth daily.    ? magnesium oxide (MAG-OX) 400 (241.3 Mg) MG tablet Take 400 mg by mouth in the morning.    ? nitroGLYCERIN (NITROSTAT) 0.4 MG SL tablet Place 0.4 mg under the tongue every 5 (five) minutes x 3 doses as needed for chest pain.    ? olmesartan (BENICAR) 20 MG tablet Take 1 tablet (20 mg total) by mouth daily. (Patient taking differently: Take 10 mg by mouth in the morning.) 90 tablet 1  ? ondansetron (ZOFRAN-ODT) 4 MG disintegrating tablet Take 4 mg by mouth every 8 (eight) hours as needed for nausea or vomiting.    ? ?No current facility-administered medications for this visit.  ? ?Facility-Administered Medications Ordered in Other Visits  ?Medication  Dose Route Frequency Provider Last Rate Last Admin  ? promethazine (PHENERGAN) injection 6.25-12.5 mg  6.25-12.5 mg Intravenous Q15 min PRN Brennan Bailey, MD      ? ? ? ROS:  See HPI ? ?Physical Exam: ? ?Today's Vitals  ? 10/19/21 1254 10/19/21 1257  ?BP: (!) 152/68 133/68  ?Pulse: 65 64  ?Temp: (!) 97.5 ?F (36.4 ?C)   ?Weight: 149 lb 4.8 oz (67.7 kg)   ?Height: 5\' 2"  (1.575 m)   ? ?Body mass index is 27.31 kg/m?. ? ? ?Incision:  healing nicely ?Extremities:  moving all extremities equally.  Tongue is midline; smile symmetrical ? ? ? ?Assessment/Plan:  This is a 81 y.o. female who is s/p: ?right CEA on 09/26/2021 by Dr. Scot Dock for asymptomatic right carotid artery stenosis.  Her last duplex  revealed normal left carotid artery with hx of left CEA August 2011 also by Dr. Scot Dock. ? ?-pt doing well without any neurologic issues.  Her incision is healing nicely.  ?-she does have some soreness around the right side of her mandible.  Discussed with her that her carotid lesion was high and we had to use a retractor underneath her mandible.  This should improve over time.   ?-will have her follow up in 9 months with carotid duplex.   ?-she will continue her plavix and statin ?-she will call sooner if she has any issues before then. ? ? ?Leontine Locket, PAC ?[facility] ?[phone] ? ? ?Clinic MD:  Scot Dock ? ?

## 2021-10-19 ENCOUNTER — Ambulatory Visit (INDEPENDENT_AMBULATORY_CARE_PROVIDER_SITE_OTHER): Payer: Medicare Other | Admitting: Physician Assistant

## 2021-10-19 ENCOUNTER — Other Ambulatory Visit: Payer: Self-pay

## 2021-10-19 ENCOUNTER — Encounter: Payer: Self-pay | Admitting: Physician Assistant

## 2021-10-19 VITALS — BP 133/68 | HR 64 | Temp 97.5°F | Ht 62.0 in | Wt 149.3 lb

## 2021-10-19 DIAGNOSIS — I6523 Occlusion and stenosis of bilateral carotid arteries: Secondary | ICD-10-CM

## 2021-10-28 ENCOUNTER — Emergency Department (HOSPITAL_COMMUNITY)
Admission: EM | Admit: 2021-10-28 | Discharge: 2021-10-28 | Disposition: A | Discharge status: Home / Self Care | Payer: Medicare Other | Attending: Emergency Medicine | Admitting: Emergency Medicine

## 2021-10-28 ENCOUNTER — Other Ambulatory Visit: Payer: Self-pay

## 2021-10-28 ENCOUNTER — Emergency Department (HOSPITAL_COMMUNITY): Payer: Medicare Other

## 2021-10-28 ENCOUNTER — Encounter (HOSPITAL_COMMUNITY): Payer: Self-pay

## 2021-10-28 DIAGNOSIS — R0602 Shortness of breath: Secondary | ICD-10-CM | POA: Diagnosis not present

## 2021-10-28 DIAGNOSIS — Z85118 Personal history of other malignant neoplasm of bronchus and lung: Secondary | ICD-10-CM | POA: Insufficient documentation

## 2021-10-28 DIAGNOSIS — Z20822 Contact with and (suspected) exposure to covid-19: Secondary | ICD-10-CM | POA: Diagnosis not present

## 2021-10-28 DIAGNOSIS — R059 Cough, unspecified: Secondary | ICD-10-CM | POA: Diagnosis present

## 2021-10-28 DIAGNOSIS — J449 Chronic obstructive pulmonary disease, unspecified: Secondary | ICD-10-CM | POA: Diagnosis not present

## 2021-10-28 DIAGNOSIS — R051 Acute cough: Secondary | ICD-10-CM

## 2021-10-28 LAB — CBC WITH DIFFERENTIAL/PLATELET
Abs Immature Granulocytes: 0.04 10*3/uL (ref 0.00–0.07)
Basophils Absolute: 0.1 10*3/uL (ref 0.0–0.1)
Basophils Relative: 1 %
Eosinophils Absolute: 0.6 10*3/uL — ABNORMAL HIGH (ref 0.0–0.5)
Eosinophils Relative: 6 %
HCT: 39.7 % (ref 36.0–46.0)
Hemoglobin: 12.8 g/dL (ref 12.0–15.0)
Immature Granulocytes: 0 %
Lymphocytes Relative: 31 %
Lymphs Abs: 3.1 10*3/uL (ref 0.7–4.0)
MCH: 28.1 pg (ref 26.0–34.0)
MCHC: 32.2 g/dL (ref 30.0–36.0)
MCV: 87.3 fL (ref 80.0–100.0)
Monocytes Absolute: 0.8 10*3/uL (ref 0.1–1.0)
Monocytes Relative: 8 %
Neutro Abs: 5.5 10*3/uL (ref 1.7–7.7)
Neutrophils Relative %: 54 %
Platelets: 253 10*3/uL (ref 150–400)
RBC: 4.55 MIL/uL (ref 3.87–5.11)
RDW: 17 % — ABNORMAL HIGH (ref 11.5–15.5)
WBC: 10.1 10*3/uL (ref 4.0–10.5)
nRBC: 0 % (ref 0.0–0.2)

## 2021-10-28 LAB — BASIC METABOLIC PANEL
Anion gap: 13 (ref 5–15)
BUN: 31 mg/dL — ABNORMAL HIGH (ref 8–23)
CO2: 23 mmol/L (ref 22–32)
Calcium: 9.7 mg/dL (ref 8.9–10.3)
Chloride: 100 mmol/L (ref 98–111)
Creatinine, Ser: 1.73 mg/dL — ABNORMAL HIGH (ref 0.44–1.00)
GFR, Estimated: 30 mL/min — ABNORMAL LOW (ref >60)
Glucose, Bld: 107 mg/dL — ABNORMAL HIGH (ref 70–99)
Potassium: 4.5 mmol/L (ref 3.5–5.1)
Sodium: 136 mmol/L (ref 135–145)

## 2021-10-28 LAB — RESP PANEL BY RT-PCR (FLU A&B, COVID) ARPGX2
Influenza A by PCR: NEGATIVE
Influenza B by PCR: NEGATIVE
SARS Coronavirus 2 by RT PCR: NEGATIVE

## 2021-10-28 LAB — D-DIMER, QUANTITATIVE: D-Dimer, Quant: 0.56 ug/mL-FEU — ABNORMAL HIGH (ref 0.00–0.50)

## 2021-10-28 LAB — BRAIN NATRIURETIC PEPTIDE: B Natriuretic Peptide: 80.5 pg/mL (ref 0.0–100.0)

## 2021-10-28 MED ORDER — PREDNISONE 10 MG PO TABS: 40.0000 mg | ORAL_TABLET | Freq: Every day | ORAL | 0 refills | Status: AC

## 2021-10-28 MED ORDER — AMOXICILLIN 500 MG PO CAPS: 500.0000 mg | ORAL_CAPSULE | Freq: Three times a day (TID) | ORAL | 0 refills | Status: DC

## 2021-10-28 NOTE — ED Triage Notes (Signed)
Pt reports SHOB that began this morning that has become progressively worse. Pt also endorses intermittent upper back pain. Denies chest pain and abdominal pain.  ?

## 2021-10-28 NOTE — Discharge Instructions (Signed)
Return for any problem.  ?

## 2021-10-28 NOTE — ED Provider Triage Note (Signed)
Emergency Medicine Provider Triage Evaluation Note ? ?[name] , a 81 y.o. female  was evaluated in triage.  Pt complains of sob. ? ?Review of Systems  ?Positive: Sob, cough, wheeze, chills, chest discomfort ?Negative: Hemoptysis, n/v/d ? ?Physical Exam  ?BP (!) 142/72 (BP Location: Left Arm)   Pulse 84   Temp 97.7 ?F (36.5 ?C) (Oral)   Resp 18   SpO2 92%  ?Gen:   Awake, no distress   ?Resp:  Normal effort  ?MSK:   Moves extremities without difficulty  ?Other:   ? ?Medical Decision Making  ?Medically screening exam initiated at 4:28 PM.  Appropriate orders placed.  [name] was informed that the remainder of the evaluation will be completed by another provider, this initial triage assessment does not replace that evaluation, and the importance of remaining in the ED until their evaluation is complete. ? ?Hx of cancer, hx of COPD, afib here with SOB and cough x 3 days.  Recent neck surgery ?  ?Natalie Moras, PA-C ?10/28/21 1633 ? ?

## 2021-10-28 NOTE — ED Provider Notes (Signed)
?[facility] DEPT ?Provider Note ? ? ?CSN: [id number] ?Arrival date & time: 10/28/21  1546 ? ?  ? ?History ? ?Chief Complaint  ?Patient presents with  ? Shortness of Breath  ? ? ?[name] is a 81 y.o. female. ? ?81 year old female with prior medical history as detailed below presents for evaluation.  Patient complains of gradual onset of cough.  This began over the last 48 to 72 hours.  Patient describes mildly productive sputum with her cough.  She denies fever.  She does report intermittently feeling hot and cold.  She denies significant shortness of breath. ? ?She is comfortable at time of evaluation. ? ?She is concerned that she may be developing a pneumonia. ? ? ? ?The history is provided by the patient and medical records.  ?Illness ?Location:  Cough, started 48 hours prior, mildly productive of sputum, subjective fever ?Severity:  Mild ?Onset quality:  Gradual ?Duration:  3 days ?Timing:  Intermittent ?Progression:  Waxing and waning ?Chronicity:  New  ?  ? ?Home Medications ?Prior to Admission medications   ?Medication Sig Start Date End Date Taking? Authorizing Provider  ?amoxicillin (AMOXIL) 500 MG capsule Take 1 capsule (500 mg total) by mouth 3 (three) times daily. 10/28/21  Yes Valarie Merino, MD  ?predniSONE (DELTASONE) 10 MG tablet Take 4 tablets (40 mg total) by mouth daily for 5 days. 10/28/21 11/02/21 Yes MessickWallis Bamberg, MD  ?acetaminophen (TYLENOL) 500 MG tablet Take 500-1,000 mg by mouth every 6 (six) hours as needed (pain).    [provider]  ?albuterol (VENTOLIN HFA) 108 (90 Base) MCG/ACT inhaler Inhale 2 puffs into the lungs every 6 (six) hours as needed for wheezing or shortness of breath. 05/25/20   Garner Nash, DO  ?atorvastatin (LIPITOR) 40 MG tablet Take 1 tablet (40 mg total) by mouth at bedtime. 09/29/21   Rhyne, Hulen Shouts, PA-C  ?cetirizine (ZYRTEC) 10 MG tablet Take 10 mg by mouth daily as needed for allergies.     [provider]  ?clopidogrel (PLAVIX) 75 MG tablet Take 1 tablet (75 mg total) by mouth daily. ?Patient taking differently: Take 75 mg by mouth in the morning. 07/08/20   Mercy Riding, MD  ?dexlansoprazole (DEXILANT) 60 MG capsule Take 1 capsule (60 mg total) by mouth daily before breakfast. 11/17/12   Tanda Rockers, MD  ?Fluticasone-Umeclidin-Vilant (TRELEGY ELLIPTA) 200-62.5-25 MCG/ACT AEPB Inhale 1 puff into the lungs daily. 06/15/21   Garner Nash, DO  ?furosemide (LASIX) 40 MG tablet Take 40 mg by mouth daily. 01/30/21   [provider]  ?magnesium oxide (MAG-OX) 400 (241.3 Mg) MG tablet Take 400 mg by mouth in the morning. 11/09/20   [provider]  ?nitroGLYCERIN (NITROSTAT) 0.4 MG SL tablet Place 0.4 mg under the tongue every 5 (five) minutes x 3 doses as needed for chest pain.    [provider]  ?olmesartan (BENICAR) 20 MG tablet Take 1 tablet (20 mg total) by mouth daily. ?Patient taking differently: Take 10 mg by mouth in the morning. 07/04/20   Mercy Riding, MD  ?ondansetron (ZOFRAN-ODT) 4 MG disintegrating tablet Take 4 mg by mouth every 8 (eight) hours as needed for nausea or vomiting. 03/29/21   [provider]  ?   ? ?Allergies    ?Azithromycin, Codeine, Doxycycline, Hydromorphone, Levaquin [levofloxacin], Vitamin d analogs, Avelox [moxifloxacin hcl in nacl], Oxycodone-acetaminophen, and Risedronate   ? ?Review of Systems   ?Review of Systems  ?  All other systems reviewed and are negative. ? ?Physical Exam ?Updated Vital Signs ?BP (!) 107/51   Pulse (!) 57   Temp 97.7 ?F (36.5 ?C) (Oral)   Resp (!) 23   SpO2 97%  ?Physical Exam ?Vitals and nursing note reviewed.  ?Constitutional:   ?   General: She is not in acute distress. ?   Appearance: Normal appearance. She is well-developed.  ?HENT:  ?   Head: Normocephalic and atraumatic.  ?Eyes:  ?   Conjunctiva/sclera: Conjunctivae normal.  ?   Pupils: Pupils are equal, round, and reactive to light.   ?Cardiovascular:  ?   Rate and Rhythm: Normal rate and regular rhythm.  ?   Heart sounds: Normal heart sounds.  ?Pulmonary:  ?   Effort: Pulmonary effort is normal. No respiratory distress.  ?   Breath sounds: Normal breath sounds.  ?Abdominal:  ?   General: There is no distension.  ?   Palpations: Abdomen is soft.  ?   Tenderness: There is no abdominal tenderness.  ?Musculoskeletal:     ?   General: No deformity. Normal range of motion.  ?   Cervical back: Normal range of motion and neck supple.  ?Skin: ?   General: Skin is warm and dry.  ?Neurological:  ?   General: No focal deficit present.  ?   Mental Status: She is alert and oriented to person, place, and time.  ? ? ?ED Results / Procedures / Treatments   ?Labs ?(all labs ordered are listed, but only abnormal results are displayed) ?Labs Reviewed  ?BASIC METABOLIC PANEL - Abnormal; Notable for the following components:  ?    Result Value  ? Glucose, Bld 107 (*)   ? BUN 31 (*)   ? Creatinine, Ser 1.73 (*)   ? GFR, Estimated 30 (*)   ? All other components within normal limits  ?CBC WITH DIFFERENTIAL/PLATELET - Abnormal; Notable for the following components:  ? RDW 17.0 (*)   ? Eosinophils Absolute 0.6 (*)   ? All other components within normal limits  ?D-DIMER, QUANTITATIVE - Abnormal; Notable for the following components:  ? D-Dimer, Quant 0.56 (*)   ? All other components within normal limits  ?RESP PANEL BY RT-PCR (FLU A&B, COVID) [id number]  ?BRAIN NATRIURETIC PEPTIDE  ? ? ?EKG ?EKG Interpretation ? ?Date/Time:  Saturday October 28 2021 18:45:07 EDT ?Ventricular Rate:  57 ?PR Interval:  160 ?QRS Duration: 86 ?QT Interval:  427 ?QTC Calculation: 416 ?R Axis:   53 ?Text Interpretation: Sinus rhythm Confirmed by Dene Gentry [phone]) on 10/28/2021 6:53:44 PM ? ?Radiology ?DG Chest 2 View ? ?Result Date: 10/28/2021 ?CLINICAL DATA:  Shortness of breath.  COPD.  History of lung cancer. EXAM: CHEST - 2 VIEW COMPARISON:  AP chest 11/08/2020 CT chest 08/22/2021 FINDINGS:  Status post median sternotomy. Cardiac silhouette and mediastinal contours are within normal limits with mild calcification within aortic arch. No significant change in lobular right mid to lower lung opacity corresponding to the known spiculated mass within the posterosuperior aspect of the right lower lobe on prior CTs. The left lung is clear. No pleural effusion or pneumothorax. Moderate multilevel degenerative disc changes of the thoracic spine. Cholecystectomy clips. IMPRESSION: Known right lower lobe oval nodular density status post radiation treatment of pulmonary malignancy. No new airspace opacity to indicate pneumonia. Electronically Signed   By: Yvonne Kendall M.D.   On: 10/28/2021 16:37   ? ?Procedures ?Procedures  ? ? ?Medications Ordered in ED ?Medications -  No data to display ? ?ED Course/ Medical Decision Making/ A&P ?  ?                        ?Medical Decision Making ?Amount and/or Complexity of Data Reviewed ?Radiology: ordered. ? ?Risk ?Prescription drug management. ? ? ? ?Medical Screen Complete ? ?This patient presented to the ED with complaint of cough. ? ?This complaint involves an extensive number of treatment options. The initial differential diagnosis includes, but is not limited to, pneumonia, COPD exacerbation, metabolic abnormality, etc. ? ?This presentation is: Acute, Chronic, Self-Limited, Previously Undiagnosed, Uncertain Prognosis, Complicated, Systemic Symptoms, and Threat to Life/Bodily Function ? ?Patient with multiple comorbidities presents with complaint of gradually worsening cough over the last 40 to 72 hours. ? ?Patient with multiple comorbidities including history of COPD / Emphysema. She is concerned about possibility of pneumonia.  Patient without objective fever.  Patient without significant hypoxia.  Additionally, patient without tachycardia or hypotension.  ? ?Chest x-ray is without acute abnormality ? ?Screening labs without acute abnormality. D-dimer at upper limit of  normal (age adjusted it is very low risk for VTE). ? ?Patient is reassured by her work-up here in the ED. ? ?Patient does have established PCP and plans on contacting them first thing on Monday for evaluation in the clinic. ? ?Fraser Din

## 2021-11-18 ENCOUNTER — Inpatient Hospital Stay (HOSPITAL_COMMUNITY): Payer: Medicare Other

## 2021-11-18 ENCOUNTER — Encounter (HOSPITAL_COMMUNITY): Payer: Self-pay | Admitting: Internal Medicine

## 2021-11-18 ENCOUNTER — Emergency Department (HOSPITAL_COMMUNITY): Payer: Medicare Other

## 2021-11-18 ENCOUNTER — Other Ambulatory Visit: Payer: Self-pay

## 2021-11-18 ENCOUNTER — Observation Stay (HOSPITAL_COMMUNITY)
Admission: EM | Admit: 2021-11-18 | Discharge: 2021-11-20 | Disposition: A | Discharge status: Home Health | Payer: Medicare Other | Attending: Internal Medicine | Admitting: Internal Medicine

## 2021-11-18 DIAGNOSIS — Z7902 Long term (current) use of antithrombotics/antiplatelets: Secondary | ICD-10-CM | POA: Diagnosis not present

## 2021-11-18 DIAGNOSIS — I2510 Atherosclerotic heart disease of native coronary artery without angina pectoris: Secondary | ICD-10-CM | POA: Insufficient documentation

## 2021-11-18 DIAGNOSIS — R202 Paresthesia of skin: Secondary | ICD-10-CM | POA: Diagnosis present

## 2021-11-18 DIAGNOSIS — Z87891 Personal history of nicotine dependence: Secondary | ICD-10-CM | POA: Insufficient documentation

## 2021-11-18 DIAGNOSIS — Z79899 Other long term (current) drug therapy: Secondary | ICD-10-CM | POA: Insufficient documentation

## 2021-11-18 DIAGNOSIS — D509 Iron deficiency anemia, unspecified: Secondary | ICD-10-CM | POA: Diagnosis present

## 2021-11-18 DIAGNOSIS — I639 Cerebral infarction, unspecified: Secondary | ICD-10-CM | POA: Diagnosis not present

## 2021-11-18 DIAGNOSIS — Z85118 Personal history of other malignant neoplasm of bronchus and lung: Secondary | ICD-10-CM | POA: Diagnosis not present

## 2021-11-18 DIAGNOSIS — N189 Chronic kidney disease, unspecified: Secondary | ICD-10-CM | POA: Insufficient documentation

## 2021-11-18 DIAGNOSIS — Z951 Presence of aortocoronary bypass graft: Secondary | ICD-10-CM | POA: Insufficient documentation

## 2021-11-18 DIAGNOSIS — J449 Chronic obstructive pulmonary disease, unspecified: Secondary | ICD-10-CM | POA: Diagnosis not present

## 2021-11-18 DIAGNOSIS — I779 Disorder of arteries and arterioles, unspecified: Secondary | ICD-10-CM | POA: Diagnosis present

## 2021-11-18 DIAGNOSIS — I10 Essential (primary) hypertension: Secondary | ICD-10-CM | POA: Diagnosis present

## 2021-11-18 DIAGNOSIS — I5032 Chronic diastolic (congestive) heart failure: Secondary | ICD-10-CM | POA: Insufficient documentation

## 2021-11-18 DIAGNOSIS — Z8673 Personal history of transient ischemic attack (TIA), and cerebral infarction without residual deficits: Secondary | ICD-10-CM | POA: Insufficient documentation

## 2021-11-18 DIAGNOSIS — E785 Hyperlipidemia, unspecified: Secondary | ICD-10-CM | POA: Diagnosis present

## 2021-11-18 DIAGNOSIS — G459 Transient cerebral ischemic attack, unspecified: Principal | ICD-10-CM

## 2021-11-18 DIAGNOSIS — I130 Hypertensive heart and chronic kidney disease with heart failure and stage 1 through stage 4 chronic kidney disease, or unspecified chronic kidney disease: Secondary | ICD-10-CM | POA: Diagnosis not present

## 2021-11-18 DIAGNOSIS — J45909 Unspecified asthma, uncomplicated: Secondary | ICD-10-CM | POA: Insufficient documentation

## 2021-11-18 DIAGNOSIS — R200 Anesthesia of skin: Principal | ICD-10-CM | POA: Diagnosis present

## 2021-11-18 LAB — URINALYSIS, ROUTINE W REFLEX MICROSCOPIC
Bacteria, UA: NONE SEEN
Bilirubin Urine: NEGATIVE
Glucose, UA: NEGATIVE mg/dL
Hgb urine dipstick: NEGATIVE
Ketones, ur: NEGATIVE mg/dL
Nitrite: NEGATIVE
Protein, ur: NEGATIVE mg/dL
Specific Gravity, Urine: 1.004 — ABNORMAL LOW (ref 1.005–1.030)
pH: 6 (ref 5.0–8.0)

## 2021-11-18 LAB — CBC
HCT: 31.6 % — ABNORMAL LOW (ref 36.0–46.0)
Hemoglobin: 9.9 g/dL — ABNORMAL LOW (ref 12.0–15.0)
MCH: 28.4 pg (ref 26.0–34.0)
MCHC: 31.3 g/dL (ref 30.0–36.0)
MCV: 90.5 fL (ref 80.0–100.0)
Platelets: 265 10*3/uL (ref 150–400)
RBC: 3.49 MIL/uL — ABNORMAL LOW (ref 3.87–5.11)
RDW: 17.1 % — ABNORMAL HIGH (ref 11.5–15.5)
WBC: 6.8 10*3/uL (ref 4.0–10.5)
nRBC: 0 % (ref 0.0–0.2)

## 2021-11-18 LAB — COMPREHENSIVE METABOLIC PANEL
ALT: 12 U/L (ref 0–44)
AST: 22 U/L (ref 15–41)
Albumin: 3.4 g/dL — ABNORMAL LOW (ref 3.5–5.0)
Alkaline Phosphatase: 74 U/L (ref 38–126)
Anion gap: 8 (ref 5–15)
BUN: 25 mg/dL — ABNORMAL HIGH (ref 8–23)
CO2: 24 mmol/L (ref 22–32)
Calcium: 9.3 mg/dL (ref 8.9–10.3)
Chloride: 106 mmol/L (ref 98–111)
Creatinine, Ser: 1.46 mg/dL — ABNORMAL HIGH (ref 0.44–1.00)
GFR, Estimated: 37 mL/min — ABNORMAL LOW (ref >60)
Glucose, Bld: 153 mg/dL — ABNORMAL HIGH (ref 70–99)
Potassium: 4 mmol/L (ref 3.5–5.1)
Sodium: 138 mmol/L (ref 135–145)
Total Bilirubin: 0.4 mg/dL (ref 0.3–1.2)
Total Protein: 6.8 g/dL (ref 6.5–8.1)

## 2021-11-18 LAB — DIFFERENTIAL
Abs Immature Granulocytes: 0.02 10*3/uL (ref 0.00–0.07)
Basophils Absolute: 0.1 10*3/uL (ref 0.0–0.1)
Basophils Relative: 1 %
Eosinophils Absolute: 0.5 10*3/uL (ref 0.0–0.5)
Eosinophils Relative: 7 %
Immature Granulocytes: 0 %
Lymphocytes Relative: 35 %
Lymphs Abs: 2.4 10*3/uL (ref 0.7–4.0)
Monocytes Absolute: 0.5 10*3/uL (ref 0.1–1.0)
Monocytes Relative: 8 %
Neutro Abs: 3.4 10*3/uL (ref 1.7–7.7)
Neutrophils Relative %: 49 %

## 2021-11-18 LAB — RAPID URINE DRUG SCREEN, HOSP PERFORMED
Amphetamines: NOT DETECTED
Barbiturates: NOT DETECTED
Benzodiazepines: NOT DETECTED
Cocaine: NOT DETECTED
Opiates: NOT DETECTED
Tetrahydrocannabinol: NOT DETECTED

## 2021-11-18 LAB — I-STAT CHEM 8, ED
BUN: 29 mg/dL — ABNORMAL HIGH (ref 8–23)
Calcium, Ion: 1.18 mmol/L (ref 1.15–1.40)
Chloride: 104 mmol/L (ref 98–111)
Creatinine, Ser: 1.5 mg/dL — ABNORMAL HIGH (ref 0.44–1.00)
Glucose, Bld: 150 mg/dL — ABNORMAL HIGH (ref 70–99)
HCT: 29 % — ABNORMAL LOW (ref 36.0–46.0)
Hemoglobin: 9.9 g/dL — ABNORMAL LOW (ref 12.0–15.0)
Potassium: 4.1 mmol/L (ref 3.5–5.1)
Sodium: 139 mmol/L (ref 135–145)
TCO2: 26 mmol/L (ref 22–32)

## 2021-11-18 LAB — PROTIME-INR
INR: 1 (ref 0.8–1.2)
Prothrombin Time: 13.1 seconds (ref 11.4–15.2)

## 2021-11-18 LAB — MAGNESIUM: Magnesium: 2 mg/dL (ref 1.7–2.4)

## 2021-11-18 LAB — ETHANOL: Alcohol, Ethyl (B): <10 mg/dL (ref <10)

## 2021-11-18 LAB — APTT: aPTT: 30 seconds (ref 24–36)

## 2021-11-18 MED ORDER — STROKE: EARLY STAGES OF RECOVERY BOOK
Freq: Once | Status: AC
  Filled 2021-11-18: qty 1

## 2021-11-18 MED ORDER — MAGNESIUM OXIDE -MG SUPPLEMENT 400 (240 MG) MG PO TABS
400.0000 mg | ORAL_TABLET | Freq: Every morning | ORAL | Status: DC
  Administered 2021-11-19 – 2021-11-20 (×2): 400 mg via ORAL
  Filled 2021-11-18 (×2): qty 1

## 2021-11-18 MED ORDER — ONDANSETRON 4 MG PO TBDP: 4.0000 mg | ORAL_TABLET | Freq: Three times a day (TID) | ORAL | Status: DC | PRN

## 2021-11-18 MED ORDER — FLUTICASONE FUROATE-VILANTEROL 200-25 MCG/ACT IN AEPB
1.0000 | INHALATION_SPRAY | Freq: Every day | RESPIRATORY_TRACT | Status: DC
  Administered 2021-11-18 – 2021-11-19 (×2): 1 via RESPIRATORY_TRACT
  Filled 2021-11-18: qty 28

## 2021-11-18 MED ORDER — FUROSEMIDE 20 MG PO TABS
40.0000 mg | ORAL_TABLET | Freq: Every day | ORAL | Status: DC
  Administered 2021-11-19 – 2021-11-20 (×2): 40 mg via ORAL
  Filled 2021-11-18 (×2): qty 2

## 2021-11-18 MED ORDER — SENNOSIDES-DOCUSATE SODIUM 8.6-50 MG PO TABS: 1.0000 | ORAL_TABLET | Freq: Every evening | ORAL | Status: DC | PRN

## 2021-11-18 MED ORDER — ATORVASTATIN CALCIUM 40 MG PO TABS
40.0000 mg | ORAL_TABLET | Freq: Every day | ORAL | Status: DC
  Administered 2021-11-18 – 2021-11-19 (×2): 40 mg via ORAL
  Filled 2021-11-18 (×2): qty 1

## 2021-11-18 MED ORDER — CLOPIDOGREL BISULFATE 75 MG PO TABS
75.0000 mg | ORAL_TABLET | Freq: Every day | ORAL | Status: DC
  Administered 2021-11-18 – 2021-11-20 (×3): 75 mg via ORAL
  Filled 2021-11-18 (×3): qty 1

## 2021-11-18 MED ORDER — ACETAMINOPHEN 500 MG PO TABS
500.0000 mg | ORAL_TABLET | Freq: Four times a day (QID) | ORAL | Status: DC | PRN
  Administered 2021-11-19: 1000 mg via ORAL
  Filled 2021-11-18 (×2): qty 2

## 2021-11-18 MED ORDER — SODIUM CHLORIDE 0.9 % IV SOLN: INTRAVENOUS | Status: DC

## 2021-11-18 MED ORDER — ENOXAPARIN SODIUM 30 MG/0.3ML IJ SOSY
30.0000 mg | PREFILLED_SYRINGE | INTRAMUSCULAR | Status: DC
  Administered 2021-11-18 – 2021-11-19 (×2): 30 mg via SUBCUTANEOUS
  Filled 2021-11-18 (×2): qty 0.3

## 2021-11-18 MED ORDER — ALBUTEROL SULFATE (2.5 MG/3ML) 0.083% IN NEBU: 3.0000 mL | INHALATION_SOLUTION | Freq: Four times a day (QID) | RESPIRATORY_TRACT | Status: DC | PRN

## 2021-11-18 MED ORDER — PANTOPRAZOLE SODIUM 40 MG PO TBEC
40.0000 mg | DELAYED_RELEASE_TABLET | Freq: Every day | ORAL | Status: DC
  Administered 2021-11-19 – 2021-11-20 (×2): 40 mg via ORAL
  Filled 2021-11-18 (×2): qty 1

## 2021-11-18 MED ORDER — LORATADINE 10 MG PO TABS
10.0000 mg | ORAL_TABLET | Freq: Every day | ORAL | Status: DC
  Administered 2021-11-19 – 2021-11-20 (×2): 10 mg via ORAL
  Filled 2021-11-18 (×2): qty 1

## 2021-11-18 MED ORDER — IRBESARTAN 150 MG PO TABS
150.0000 mg | ORAL_TABLET | Freq: Every day | ORAL | Status: DC
Start: 2021-11-18 — End: 2021-11-20
  Administered 2021-11-19 – 2021-11-20 (×2): 150 mg via ORAL
  Filled 2021-11-18 (×2): qty 1

## 2021-11-18 MED ORDER — NITROGLYCERIN 0.4 MG SL SUBL: 0.4000 mg | SUBLINGUAL_TABLET | SUBLINGUAL | Status: DC | PRN

## 2021-11-18 MED ORDER — UMECLIDINIUM BROMIDE 62.5 MCG/ACT IN AEPB
1.0000 | INHALATION_SPRAY | Freq: Every day | RESPIRATORY_TRACT | Status: DC
  Administered 2021-11-18 – 2021-11-19 (×2): 1 via RESPIRATORY_TRACT
  Filled 2021-11-18: qty 7

## 2021-11-18 NOTE — Consult Note (Signed)
Neurology Consultation ? ?Reason for Consult: Intermittent right leg numbness ?Referring Physician: Dr. Regenia Skeeter  ? ?CC: Right side body numbness  ? ?History is obtained from:patient and medical chart  ? ?HPI: [name] is a 81 y.o. female with a past medical history of CAD s/p MI, A-fib, s/p right CEA (09/26/2021), HTN, HLD, Chiari malformation, Right MCA aneurysm followed by Dr. Estanislado Pandy COPD, GERD, Lung and skin cancer, CKD, glaucoma, headaches, PVD with stents in legs, neuropathy who presents for intermittent spells of numbness of the right side of her body. She states that last night at dinner she started to develop numbness in her right hip which then progressed to the right leg, causing her to constantly change positions to standing and sitting. She also had a headache last pm into today as well. This morning she was still having intermittent numbness of her right leg which then went to the right side of her face. She states her daughter mentioned that her face was drooping and that she had slurred speech prior to EMS arrival. Her blood pressure was elevated @ 220/100. She states the leg numbness comes and goes, whereas the right sided facial numbness is persistent. Headache pain is 6/10. Currently is also complaining of right groin pain that is new for her.  Neurology consulted for further evaluation.  ? ?LKW: 11/17/2021 ?tpa given?: no, outside window  ?Premorbid modified Rankin scale (mRS): ?1-No significant post stroke disability and can perform usual duties with stroke symptoms ? ?ROS: Full ROS was performed and is negative except as noted in the HPI.   ? ?Past Medical History:  ?Diagnosis Date  ? 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 Asthma   ? 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  ? Blood in stool   ? last week of aug 2018  ? Carotid artery occlusion   ? Chronic kidney disease   ? Complication of  anesthesia   ? COPD (chronic obstructive pulmonary disease) ([place])   ? Coronary artery disease   ? 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  ? Diverticulitis   ? Dyspnea   ? with exertion  ? Gastroesophageal reflux disease   ? occ  ? Glaucoma   ? right eye  ? Headache   ? tension  ? Headache syndrome 11/27/2018  ? Hiatal hernia   ? Hyperlipidemia   ? Hypertension   ? Iliac artery aneurysm ([place])   ? Lung cancer ([place]) dx 2018  ? 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 Myocardial infarction ([place]) 01/01/2000  ? Cardiac catheterization  ? Peripheral vascular disease ([place])   ? stents in legs x 2 or 3  ? Pneumonia   ? last time winter 2017 -2018  ? PONV (postoperative nausea and vomiting)   ? occassionally, last colonscopy did ok with anesthesia  ? Reflux   ? Wears dentures   ? Full set  ? Wears glasses   ? ?Essential (primary) hypertension, Paroxysmal atrial fibrillation, and CKD Stage 3 (GFR 30-59)  ? ?Family History  ?Problem Relation Age of Onset  ? Heart disease Mother   ?     Heart Disease before age 5  ? Hypertension Mother   ? Hyperlipidemia Mother   ? Heart attack Mother   ? Clotting disorder Mother   ? Heart disease Father   ?     Heart Disease before age 75  ? Heart attack Father   ?  Hyperlipidemia Father   ? Hypertension Father   ? Heart disease Brother   ?     Heart Disease before age 37  ? Hyperlipidemia Brother   ? Hypertension Brother   ? Clotting disorder Brother   ? AAA (abdominal aortic aneurysm) Brother   ? Cerebral aneurysm Sister   ? Hypertension Sister   ? AAA (abdominal aortic aneurysm) Sister   ? Asthma Sister   ? Cerebral aneurysm Brother   ? Cancer Brother   ?     Lung  ? Hypertension Brother   ? Heart attack Brother   ? Heart disease Brother   ?     Aneurysm of Brain  ? Hypertension Brother   ? Heart disease Brother   ? Heart disease Brother   ? Stroke Son   ?     Aneurysm of Stomach  ? AAA (abdominal aortic aneurysm) Son   ? Cancer Maternal Uncle   ?     great uncle/cancer/type unknown   ? ? ? ?Social History:  ? reports that she quit smoking about 21 years ago. Her smoking use included cigarettes. She has a 45.00 pack-year smoking history. She has never used smokeless tobacco. She reports that she does not drink alcohol and does not use drugs. ? ?Medications ?No current facility-administered medications for this encounter. ? ?Current Outpatient Medications:  ?  acetaminophen (TYLENOL) 500 MG tablet, Take 500-1,000 mg by mouth every 6 (six) hours as needed (pain)., Disp: , Rfl:  ?  albuterol (VENTOLIN HFA) 108 (90 Base) MCG/ACT inhaler, Inhale 2 puffs into the lungs every 6 (six) hours as needed for wheezing or shortness of breath., Disp: 8 g, Rfl: 6 ?  amoxicillin (AMOXIL) 500 MG capsule, Take 1 capsule (500 mg total) by mouth 3 (three) times daily., Disp: 21 capsule, Rfl: 0 ?  atorvastatin (LIPITOR) 40 MG tablet, Take 1 tablet (40 mg total) by mouth at bedtime., Disp: 30 tablet, Rfl: 6 ?  cetirizine (ZYRTEC) 10 MG tablet, Take 10 mg by mouth daily as needed for allergies. , Disp: , Rfl:  ?  clopidogrel (PLAVIX) 75 MG tablet, Take 1 tablet (75 mg total) by mouth daily. (Patient taking differently: Take 75 mg by mouth in the morning.), Disp: 90 tablet, Rfl: 3 ?  dexlansoprazole (DEXILANT) 60 MG capsule, Take 1 capsule (60 mg total) by mouth daily before breakfast., Disp: 30 capsule, Rfl: 11 ?  Fluticasone-Umeclidin-Vilant (TRELEGY ELLIPTA) 200-62.5-25 MCG/ACT AEPB, Inhale 1 puff into the lungs daily., Disp: 180 each, Rfl: 3 ?  furosemide (LASIX) 40 MG tablet, Take 40 mg by mouth daily., Disp: , Rfl:  ?  magnesium oxide (MAG-OX) 400 (241.3 Mg) MG tablet, Take 400 mg by mouth in the morning., Disp: , Rfl:  ?  nitroGLYCERIN (NITROSTAT) 0.4 MG SL tablet, Place 0.4 mg under the tongue every 5 (five) minutes x 3 doses as needed for chest pain., Disp: , Rfl:  ?  olmesartan (BENICAR) 20 MG tablet, Take 1 tablet (20 mg total) by mouth daily. (Patient taking differently: Take 10 mg by mouth in the morning.),  Disp: 90 tablet, Rfl: 1 ?  ondansetron (ZOFRAN-ODT) 4 MG disintegrating tablet, Take 4 mg by mouth every 8 (eight) hours as needed for nausea or vomiting., Disp: , Rfl:  ? ?Facility-Administered Medications Ordered in Other Encounters:  ?  promethazine (PHENERGAN) injection 6.25-12.5 mg, 6.25-12.5 mg, Intravenous, Q15 min PRN, Daiva Huge, Leanord Hawking, MD ? ? ?Exam: ?Current vital signs: ?BP (!) 152/53   Pulse Marland Kitchen)  54   Temp 98 ?F (36.7 ?C) (Oral)   Resp 20   Ht 5\' 2"  (1.575 m)   Wt 65.8 kg   SpO2 99%   BMI 26.52 kg/m?  ?Vital signs in last 24 hours: ?Temp:  [98 ?F (36.7 ?C)] 98 ?F (36.7 ?C) (04/08 1246) ?Pulse Rate:  [54-61] 54 (04/08 1528) ?Resp:  [14-25] 20 (04/08 1528) ?BP: (110-152)/(53-92) 152/53 (04/08 1528) ?SpO2:  [97 %-100 %] 99 % (04/08 1528) ?Weight:  [65.8 kg] 65.8 kg (04/08 1247) ? ?GENERAL: Awake, alert in NAD ?HEENT: - Normocephalic and atraumatic, dry mm, old scar on neck for CEA ?LUNGS - Clear to auscultation bilaterally with no wheezes ?CV - S1S2 RRR, no m/r/g, equal pulses bilaterally. ?ABDOMEN - Soft, nontender, nondistended with normoactive BS ?Ext: multiple bruises, warm, well perfused, intact peripheral pulses, no edema ? ?NEURO:  ?Mental Status: awake, alert and orient x4. follows commands. No evidence for an expressive or receptive aphasia.  ?CN: Visual fields are full. Pupils equal and reactive. EOMI. Face symmetric. Phonation intact. Head is midline.  ?Motor: 5/5 in all 4 extremities. Tone is normal and bulk is normal ?Sensation- Decreased on right face and right foot to FT but normal temp sensation ?Coordination: FTN intact bilaterally, no ataxia in BLE. ?Gait- Deferred ? ?NIHSS ?1a Level of Conscious.: 0 ?1b LOC Questions: 0 ?1c LOC Commands: 0 ?2 Best Gaze: 0 ?3 Visual: 0 ?4 Facial Palsy: 0 ?5a Motor Arm - left: 0 ?5b Motor Arm - Right: 0 ?6a Motor Leg - Left: 0 ?6b Motor Leg - Right: 0 ?7 Limb Ataxia: 0 ?8 Sensory: 1 ?9 Best Language: 0 ?10 Dysarthria: 0 ?11 Extinct. and Inatten.:  0 ?TOTAL: 1  ? ? ?Imaging ?I have reviewed the images obtained: ? ?CT-head 4/8: ?No acute intracranial abnormalities ? ?Assessment: AMMY LIENHARD is a 81 y.o. female with a past medical history of CAD s/p MI, A fib, s/p right

## 2021-11-18 NOTE — Assessment & Plan Note (Signed)
High risk patient. On statin therapy.  ? ?Plan Routine lipid panel in AM ?

## 2021-11-18 NOTE — ED Triage Notes (Signed)
Pt comes from home by [facility] after calling for numbness and tingling on right hip last night, moving to leg, then to arm and face this morning with headache 7/10. Headache 10/10 this morning. BP 200/110 with EMS. Patient reports nausea this am, improved with 4mg  zofran by EMS. A&O at this time, takes plavix.  ?

## 2021-11-18 NOTE — Assessment & Plan Note (Signed)
CODP GOLD II - stable at exam with clear lungs ? ?Plan Continue home regimen of pulmonary meds ?

## 2021-11-18 NOTE — Assessment & Plan Note (Signed)
Last Echo in [place] 06/14/20 EF 60-65%, Grade II DDD. She is followed by Dr. Terrence Dupont and is well compensated ? ?Plan Continue home meds ?

## 2021-11-18 NOTE — Assessment & Plan Note (Signed)
See above. S/P right CEA Feb 14, '23. Seen 10/17/21 for follow and doing well. 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

## 2021-11-18 NOTE — Assessment & Plan Note (Signed)
Stable w/o chest pain or other symptoms. Current with Dr. Terrence Dupont. EKG nl. ? ?Plan  Continue home regimen ?

## 2021-11-18 NOTE — ED Provider Notes (Signed)
?[facility] ?Provider Note ? ? ?CSN: [id number] ?Arrival date & time: 11/18/21  1238 ? ?  ? ?History ? ?Chief Complaint  ?Patient presents with  ? Numbness  ? ? ?[name] is a 81 y.o. female. ? ?HPI ?81 year old female presents with right-sided numbness.  Symptoms started with numbness and tingling and pain in her right hip last night.  Then progressed to right leg numbness.  This morning around 9 AM she developed right facial numbness/odd feeling.  At around 11 AM she had a headache that she states was pretty severe and her blood pressure was 220/100.  Headache has improved.  Patient's daughter reports that the patient had facial droop transiently before EMS arrived.  Patient has not noticed any weakness during this time. ? ?Home Medications ?Prior to Admission medications   ?Medication Sig Start Date End Date Taking? Authorizing Provider  ?acetaminophen (TYLENOL) 500 MG tablet Take 500-1,000 mg by mouth every 6 (six) hours as needed (pain).    [provider]  ?albuterol (VENTOLIN HFA) 108 (90 Base) MCG/ACT inhaler Inhale 2 puffs into the lungs every 6 (six) hours as needed for wheezing or shortness of breath. 05/25/20   Garner Nash, DO  ?amoxicillin (AMOXIL) 500 MG capsule Take 1 capsule (500 mg total) by mouth 3 (three) times daily. 10/28/21   Valarie Merino, MD  ?atorvastatin (LIPITOR) 40 MG tablet Take 1 tablet (40 mg total) by mouth at bedtime. 09/29/21   Rhyne, Hulen Shouts, PA-C  ?cetirizine (ZYRTEC) 10 MG tablet Take 10 mg by mouth daily as needed for allergies.     [provider]  ?clopidogrel (PLAVIX) 75 MG tablet Take 1 tablet (75 mg total) by mouth daily. ?Patient taking differently: Take 75 mg by mouth in the morning. 07/08/20   Mercy Riding, MD  ?dexlansoprazole (DEXILANT) 60 MG capsule Take 1 capsule (60 mg total) by mouth daily before breakfast. 11/17/12   Tanda Rockers, MD  ?Fluticasone-Umeclidin-Vilant (TRELEGY ELLIPTA) 200-62.5-25  MCG/ACT AEPB Inhale 1 puff into the lungs daily. 06/15/21   Garner Nash, DO  ?furosemide (LASIX) 40 MG tablet Take 40 mg by mouth daily. 01/30/21   [provider]  ?magnesium oxide (MAG-OX) 400 (241.3 Mg) MG tablet Take 400 mg by mouth in the morning. 11/09/20   [provider]  ?nitroGLYCERIN (NITROSTAT) 0.4 MG SL tablet Place 0.4 mg under the tongue every 5 (five) minutes x 3 doses as needed for chest pain.    [provider]  ?olmesartan (BENICAR) 20 MG tablet Take 1 tablet (20 mg total) by mouth daily. ?Patient taking differently: Take 10 mg by mouth in the morning. 07/04/20   Mercy Riding, MD  ?ondansetron (ZOFRAN-ODT) 4 MG disintegrating tablet Take 4 mg by mouth every 8 (eight) hours as needed for nausea or vomiting. 03/29/21   [provider]  ?   ? ?Allergies    ?Azithromycin, Codeine, Doxycycline, Hydromorphone, Levaquin [levofloxacin], Vitamin d analogs, Avelox [moxifloxacin hcl in nacl], Oxycodone-acetaminophen, and Risedronate   ? ?Review of Systems   ?Review of Systems  ?Eyes:  Negative for visual disturbance.  ?Cardiovascular:  Negative for chest pain.  ?Gastrointestinal:  Positive for vomiting.  ?Neurological:  Positive for numbness and headaches. Negative for weakness.  ? ?Physical Exam ?Updated Vital Signs ?BP 119/69   Pulse 61   Temp 98 ?F (36.7 ?C) (Oral)   Resp (!) 22   Ht 5\' 2"  (1.575 m)   Wt 65.8  kg   SpO2 99%   BMI 26.52 kg/m?  ?Physical Exam ?Vitals and nursing note reviewed.  ?Constitutional:   ?   Appearance: She is well-developed.  ?HENT:  ?   Head: Normocephalic and atraumatic.  ?Eyes:  ?   Extraocular Movements: Extraocular movements intact.  ?   Pupils: Pupils are equal, round, and reactive to light.  ?   Comments: Normal visual field testing  ?Cardiovascular:  ?   Rate and Rhythm: Normal rate and regular rhythm.  ?   Heart sounds: Normal heart sounds.  ?Pulmonary:  ?   Effort: Pulmonary effort is normal.  ?   Breath sounds: Normal breath  sounds.  ?Abdominal:  ?   Palpations: Abdomen is soft.  ?   Tenderness: There is no abdominal tenderness.  ?Skin: ?   General: Skin is warm and dry.  ?Neurological:  ?   Mental Status: She is alert.  ?   Comments: CN 3-12 grossly intact. 5/5 strength in all 4 extremities.  Subjectively decreased sensation to the right forehead, cheek and lower face.  No facial droop.  Normal sensation bilaterally in the arms.  Subjectively decreased sensation to the lateral aspect of the right lower leg compared to left. Normal finger to nose.   ? ? ?ED Results / Procedures / Treatments   ?Labs ?(all labs ordered are listed, but only abnormal results are displayed) ?Labs Reviewed  ?CBC - Abnormal; Notable for the following components:  ?    Result Value  ? RBC 3.49 (*)   ? Hemoglobin 9.9 (*)   ? HCT 31.6 (*)   ? RDW 17.1 (*)   ? All other components within normal limits  ?COMPREHENSIVE METABOLIC PANEL - Abnormal; Notable for the following components:  ? Glucose, Bld 153 (*)   ? BUN 25 (*)   ? Creatinine, Ser 1.46 (*)   ? Albumin 3.4 (*)   ? GFR, Estimated 37 (*)   ? All other components within normal limits  ?URINALYSIS, ROUTINE W REFLEX MICROSCOPIC - Abnormal; Notable for the following components:  ? Color, Urine COLORLESS (*)   ? Specific Gravity, Urine 1.004 (*)   ? Leukocytes,Ua TRACE (*)   ? All other components within normal limits  ?I-STAT CHEM 8, ED - Abnormal; Notable for the following components:  ? BUN 29 (*)   ? Creatinine, Ser 1.50 (*)   ? Glucose, Bld 150 (*)   ? Hemoglobin 9.9 (*)   ? HCT 29.0 (*)   ? All other components within normal limits  ?ETHANOL  ?PROTIME-INR  ?APTT  ?DIFFERENTIAL  ?RAPID URINE DRUG SCREEN, HOSP PERFORMED  ?MAGNESIUM  ? ? ?EKG ?EKG Interpretation ? ?Date/Time:  Saturday November 18 2021 12:47:04 EDT ?Ventricular Rate:  62 ?PR Interval:  161 ?QRS Duration: 88 ?QT Interval:  413 ?QTC Calculation: 420 ?R Axis:   49 ?Text Interpretation: Sinus rhythm nonspecific ST segments Confirmed by Sherwood Gambler [phone]) on 11/18/2021 12:50:10 PM ? ?Radiology ?CT HEAD WO CONTRAST ? ?Result Date: 11/18/2021 ?CLINICAL DATA:  Numbness and tingling on right hip last night, moving to leg, then to arm and face this morning with headache. EXAM: CT HEAD WITHOUT CONTRAST TECHNIQUE: Contiguous axial images were obtained from the base of the skull through the vertex without intravenous contrast. RADIATION DOSE REDUCTION: This exam was performed according to the departmental dose-optimization program which includes automated exposure control, adjustment of the mA and/or kV according to patient size and/or use of iterative reconstruction technique. COMPARISON:  Prior brain MR A's.  Head CT, 02/11/2019 FINDINGS: Brain: No evidence of acute infarction, hemorrhage, hydrocephalus, extra-axial collection or mass lesion/mass effect. Mild, age-appropriate, ventricular and sulcal enlargement. Minor periventricular white matter hypoattenuation consistent with chronic microvascular ischemic change. Vascular: No hyperdense vessel or unexpected calcification. Skull: Normal. Negative for fracture or focal lesion. Sinuses/Orbits: Globes and orbits are unremarkable. Dependent fluid in the maxillary sinuses. Mild maxillary sinus mucosal thickening. Mild to moderate ethmoid sinus mucosal thickening. Mild mucosal thickening at floors of the frontal sinuses. Other: None. IMPRESSION: 1. No acute intracranial abnormalities. 2. Sinus disease including maxillary sinus air-fluid levels. Findings consistent with acute sinusitis in the proper clinical setting. Electronically Signed   By: Lajean Manes M.D.   On: 11/18/2021 13:20   ? ?Procedures ?Procedures  ? ? ?Medications Ordered in ED ?Medications - No data to display ? ?ED Course/ Medical Decision Making/ A&P ?Clinical Course as of 11/18/21 1530  ?Sat Nov 18, 2021  ?1306 It seemed like her symptoms started last night and there is no obvious large vessel occlusion on exam and so she would not be a code  stroke.  We will proceed with stroke/TIA work-up. [SG]  ?  ?Clinical Course User Index ?[SG] Sherwood Gambler, MD  ? ?                        ?Medical Decision Making ?Amount and/or Complexity of Data Reviewed ?Labs:

## 2021-11-18 NOTE — Assessment & Plan Note (Signed)
BP well controlled. Continue home regimen ?

## 2021-11-18 NOTE — Assessment & Plan Note (Signed)
Long term problem. Followed by Dr. Benson Norway. Hgb at baseline. NO sign of active bleeding ? ?Plan Continue home regimen. ?

## 2021-11-18 NOTE — Assessment & Plan Note (Signed)
Patient with h/o vascular dz s/p left CEA  Several years ago, Right CEA Feb 14, '23. She has also had surgery for Chiari malformation years ago. She has known intracranial aneurysm and has followed with Dr. Bronson Curb. The night of 11/17/21 she had onset of right facial numbness, right LE numbness. This AM she has slurred speech, continue right facial and leg numbness with mild weakness. At the time of exam her symptoms have resoved. She has been on Plavix.  ? ?Plan CVA protocol ? MRI ordered pending ? 2Decho ordered pending ? Carotid eval deferred to recent exams ? Continue plavix for now ? PT/OT eval ? Passed bedside swallow and will be allowed diet ? Neurology to follow.  ?

## 2021-11-18 NOTE — H&P (Addendum)
? ?History and Physical  ? ? ?[name]:[id number] DOB: 13-Nov-1940 DOA: 11/18/2021 ? ?DOS: the patient was seen and examined on 11/18/2021 ? ?PCP: [name], MD  ? ?Patient coming from: Home ? ?I have personally briefly reviewed patient's old medical records in [place] ? ?[name], 80, with multiple medical problems including CAD s/p CABG, iron deficiency anemia, Gi bleed, COPD, GERD  presents with right-sided numbness which began the evening of 11/17/21.  Symptoms started with numbness and tingling and pain in her right hip last night.  Then progressed to right leg numbness.  This morning around 9 AM she developed right facial numbness/odd feeling.  At around 11 AM she had a headache that she states was pretty severe and her blood pressure was 220/100. For these symptoms she came to [facility] for further evaluation. Her history does include TIA, right carotid artery disease s/p CEA Feb 14, '23. Left was clear.   ? ?ED Course: T 98, 123/51  89  25. Relevant lab: glucose 250, BUN 29, Cr 1.5 (baseline 1.6-1.7)WBC 8.8 , Hgb 9.9 (chronic anemia since Jan 2 and before). Drug/EtOH screen negative, U/A negative, CT head w/o trauma, hemorrhage. EDP exam notable for decreased sensation right forehead/cheek, lasteral right LE. TR[facility] called to admit for further evaluation TIA/CVA ? ?Review of Systems:  ?Review of Systems  ?Constitutional:  Negative for diaphoresis, fever and weight loss.  ?HENT: Negative.    ?Eyes: Negative.   ?Respiratory: Negative.    ?Cardiovascular:  Negative for chest pain and palpitations.  ?Gastrointestinal: Negative.   ?Genitourinary: Negative.   ?Musculoskeletal: Negative.   ?Skin: Negative.   ?Neurological:  Positive for tingling, sensory change and focal weakness.  ?Endo/Heme/Allergies: Negative.   ?Psychiatric/Behavioral: Negative.    ? ?Past Medical History:  ?Diagnosis Date  ? 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 Asthma   ? 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  ? Blood in stool   ? last week of aug 2018  ? Carotid artery occlusion   ? Chronic kidney disease   ? Complication of anesthesia   ? COPD (chronic obstructive pulmonary disease) ([place])   ? Coronary artery disease   ? 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  ? Diverticulitis   ? Dyspnea   ? with exertion  ? Gastroesophageal reflux disease   ? occ  ? Glaucoma   ? right eye  ? Headache   ? tension  ? Headache syndrome 11/27/2018  ? Hiatal hernia   ? Hyperlipidemia   ? Hypertension   ? Iliac artery aneurysm ([place])   ? Lung cancer ([place]) dx 2018  ? 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 Myocardial infarction ([place]) 01/01/2000  ? Cardiac catheterization  ? Peripheral vascular disease ([place])   ? stents in legs x 2 or 3  ? Pneumonia   ? last time winter 2017 -2018  ? PONV (postoperative nausea and vomiting)   ? occassionally, last colonscopy did ok with anesthesia  ? Reflux   ? Wears dentures   ? Full set  ? Wears glasses   ? ? ?Past Surgical History:  ?Procedure Laterality Date  ? ABDOMINAL HYSTERECTOMY    ? APPENDECTOMY    ? Arnold-chiari malformation repair  1998  ? Suboccipital craniectomy  ? CAROTID ENDARTERECTOMY  03/29/2010  ? Left  CEA  ? CHOLECYSTECTOMY    ? Gall Bladder  ? COLONOSCOPY WITH  PROPOFOL N/A 04/22/2015  ? Procedure: COLONOSCOPY WITH PROPOFOL;  Surgeon: Carol Ada, MD;  Location: [facility];  Service: Endoscopy;  Laterality: N/A;  ? COLONOSCOPY WITH PROPOFOL N/A 05/25/2016  ? Procedure: COLONOSCOPY WITH PROPOFOL;  Surgeon: Carol Ada, MD;  Location: [facility];  Service: Endoscopy;  Laterality: N/A;  ? COLONOSCOPY WITH PROPOFOL N/A 05/03/2017  ? Procedure: COLONOSCOPY WITH PROPOFOL;  Surgeon: Carol Ada, MD;  Location: [facility];  Service: Endoscopy;  Laterality: N/A;  ? COLONOSCOPY WITH PROPOFOL N/A 06/29/2020  ? Procedure: COLONOSCOPY WITH PROPOFOL;  Surgeon: Carol Ada, MD;  Location: [facility];  Service:  Endoscopy;  Laterality: N/A;  ? COLONOSCOPY WITH PROPOFOL N/A 01/05/2021  ? Procedure: COLONOSCOPY WITH PROPOFOL;  Surgeon: Carol Ada, MD;  Location: [facility];  Service: Endoscopy;  Laterality: N/A;  ? COLONOSCOPY WITH PROPOFOL N/A 09/28/2021  ? Procedure: COLONOSCOPY WITH PROPOFOL;  Surgeon: Carol Ada, MD;  Location: [facility];  Service: Endoscopy;  Laterality: N/A;  ? 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 CORONARY ARTERY BYPASS GRAFT  01/01/2000  ? x 3  ? ENDARTERECTOMY Right 09/26/2021  ? Procedure: RIGHT CAROTID ENDARTERECTOMY;  Surgeon: [name], MD;  Location: [facility];  Service: Vascular;  Laterality: Right;  ? ENTEROSCOPY N/A 02/27/2018  ? Procedure: ENTEROSCOPY;  Surgeon: Carol Ada, MD;  Location: [facility];  Service: Endoscopy;  Laterality: N/A;  ? ENTEROSCOPY N/A 07/18/2018  ? Procedure: ENTEROSCOPY;  Surgeon: Carol Ada, MD;  Location: [facility];  Service: Endoscopy;  Laterality: N/A;  ? ENTEROSCOPY N/A 06/29/2020  ? Procedure: ENTEROSCOPY;  Surgeon: Carol Ada, MD;  Location: [facility];  Service: Endoscopy;  Laterality: N/A;  ? ESOPHAGOGASTRODUODENOSCOPY N/A 05/25/2016  ? Procedure: ESOPHAGOGASTRODUODENOSCOPY (EGD);  Surgeon: Carol Ada, MD;  Location: [facility];  Service: Endoscopy;  Laterality: N/A;  ? ESOPHAGOGASTRODUODENOSCOPY N/A 08/01/2018  ? Procedure: ESOPHAGOGASTRODUODENOSCOPY (EGD);  Surgeon: Carol Ada, MD;  Location: [facility];  Service: Endoscopy;  Laterality: N/A;  ? ESOPHAGOGASTRODUODENOSCOPY (EGD) WITH PROPOFOL N/A 09/28/2021  ? Procedure: ESOPHAGOGASTRODUODENOSCOPY (EGD) WITH PROPOFOL;  Surgeon: Carol Ada, MD;  Location: [facility];  Service: Endoscopy;  Laterality: N/A;  ? EYE SURGERY Right 1995 or 1996  ? Laser surgery for retinal hemorrhage  ? GIVENS CAPSULE STUDY N/A 07/16/2018  ? Procedure: GIVENS CAPSULE STUDY;  Surgeon: Carol Ada, MD;  Location: [facility];  Service: Endoscopy;  Laterality: N/A;  ? HEMOSTASIS CLIP PLACEMENT   06/29/2020  ? Procedure: HEMOSTASIS CLIP PLACEMENT;  Surgeon: Carol Ada, MD;  Location: [facility];  Service: Endoscopy;;  ? HEMOSTASIS CLIP PLACEMENT  01/05/2021  ? Procedure: HEMOSTASIS CLIP PLACEMENT;  Surgeon: Carol Ada, MD;  Location: [facility];  Service: Endoscopy;;  ? HOT HEMOSTASIS N/A 02/27/2018  ? Procedure: HOT HEMOSTASIS (ARGON PLASMA COAGULATION/BICAP);  Surgeon: Carol Ada, MD;  Location: [facility];  Service: Endoscopy;  Laterality: N/A;  ? HOT HEMOSTASIS N/A 08/01/2018  ? Procedure: HOT HEMOSTASIS (ARGON PLASMA COAGULATION/BICAP);  Surgeon: Carol Ada, MD;  Location: [facility];  Service: Endoscopy;  Laterality: N/A;  ? HOT HEMOSTASIS N/A 06/29/2020  ? Procedure: HOT HEMOSTASIS (ARGON PLASMA COAGULATION/BICAP);  Surgeon: Carol Ada, MD;  Location: [facility];  Service: Endoscopy;  Laterality: N/A;  ? HOT HEMOSTASIS N/A 01/05/2021  ? Procedure: HOT HEMOSTASIS (ARGON PLASMA COAGULATION/BICAP);  Surgeon: Carol Ada, MD;  Location: [facility];  Service: Endoscopy;  Laterality: N/A;  ? IR RADIOLOGIST EVAL & MGMT  12/14/2016  ? IR RADIOLOGIST EVAL & MGMT  07/26/2021  ? LEFT HEART CATH AND CORS/GRAFTS  ANGIOGRAPHY N/A 01/09/2018  ? Procedure: LEFT HEART CATH AND CORS/GRAFTS ANGIOGRAPHY;  Surgeon: Charolette Forward, MD;  Location: [facility];  Service: Cardiovascular;  Laterality: N/A;  ? LEFT HEART CATHETERIZATION WITH CORONARY ANGIOGRAM N/A 08/03/2014  ? Procedure: LEFT HEART CATHETERIZATION WITH CORONARY ANGIOGRAM;  Surgeon: Birdie Riddle, MD;  Location: [facility];  Service: Cardiovascular;  Laterality: N/A;  ? PATCH ANGIOPLASTY Right 09/26/2021  ? Procedure: PATCH ANGIOPLASTY RIGHT CAROTID;  Surgeon: [name], MD;  Location: [facility];  Service: Vascular;  Laterality: Right;  ? Post Coronary Artery  BPG  01/05/2000  ? Right jugular sheath removed  ? PR VEIN BYPASS GRAFT,AORTO-FEM-POP    ? ROTATOR CUFF REPAIR    ? Right  ? ?Soc Hx - widowed after 60 years. Three  daughters, 1 son, 8 grandchildren, 74 great-grands. Lives in her own home with her son and grandson. 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 I-ADLs ? ? reports that she quit smoking about 21 yea

## 2021-11-18 NOTE — Subjective & Objective (Signed)
Ms. Eilert, 80, with multiple medical problems including CAD s/p CABG, iron deficiency anemia, Gi bleed, COPD, GERD  presents with right-sided numbness which began the evening of 11/17/21.  Symptoms started with numbness and tingling and pain in her right hip last night.  Then progressed to right leg numbness.  This morning around 9 AM she developed right facial numbness/odd feeling.  At around 11 AM she had a headache that she states was pretty severe and her blood pressure was 220/100. For these symptoms she came to [facility] for further evaluation. Her history does include TIA, right carotid artery disease s/p CEA Feb 14, '23. Left was clear.  ?

## 2021-11-19 ENCOUNTER — Inpatient Hospital Stay (HOSPITAL_COMMUNITY): Payer: Medicare Other

## 2021-11-19 ENCOUNTER — Inpatient Hospital Stay (HOSPITAL_BASED_OUTPATIENT_CLINIC_OR_DEPARTMENT_OTHER): Payer: Medicare Other

## 2021-11-19 DIAGNOSIS — I5032 Chronic diastolic (congestive) heart failure: Secondary | ICD-10-CM | POA: Diagnosis not present

## 2021-11-19 DIAGNOSIS — I10 Essential (primary) hypertension: Secondary | ICD-10-CM

## 2021-11-19 DIAGNOSIS — G459 Transient cerebral ischemic attack, unspecified: Secondary | ICD-10-CM | POA: Diagnosis not present

## 2021-11-19 DIAGNOSIS — J449 Chronic obstructive pulmonary disease, unspecified: Secondary | ICD-10-CM

## 2021-11-19 DIAGNOSIS — E785 Hyperlipidemia, unspecified: Secondary | ICD-10-CM

## 2021-11-19 DIAGNOSIS — I639 Cerebral infarction, unspecified: Secondary | ICD-10-CM | POA: Diagnosis present

## 2021-11-19 DIAGNOSIS — R200 Anesthesia of skin: Secondary | ICD-10-CM | POA: Diagnosis not present

## 2021-11-19 DIAGNOSIS — D509 Iron deficiency anemia, unspecified: Secondary | ICD-10-CM

## 2021-11-19 DIAGNOSIS — I2510 Atherosclerotic heart disease of native coronary artery without angina pectoris: Secondary | ICD-10-CM

## 2021-11-19 LAB — ECHOCARDIOGRAM COMPLETE
AR max vel: 1.77 cm2
AV Area VTI: 1.93 cm2
AV Area mean vel: 1.71 cm2
AV Mean grad: 5 mmHg
AV Peak grad: 9.6 mmHg
Ao pk vel: 1.55 m/s
Area-P 1/2: 1.83 cm2
Height: 62 in
S' Lateral: 2.8 cm
Weight: 2320 oz

## 2021-11-19 LAB — HEMOGLOBIN A1C
Hgb A1c MFr Bld: 5.6 % (ref 4.8–5.6)
Mean Plasma Glucose: 114.02 mg/dL

## 2021-11-19 LAB — LIPID PANEL
Cholesterol: 150 mg/dL (ref 0–200)
HDL: 40 mg/dL — ABNORMAL LOW (ref >40)
LDL Cholesterol: 88 mg/dL (ref 0–99)
Total CHOL/HDL Ratio: 3.8 RATIO
Triglycerides: 109 mg/dL (ref <150)
VLDL: 22 mg/dL (ref 0–40)

## 2021-11-19 NOTE — Progress Notes (Signed)
?PROGRESS NOTE ? ? ? ?[name]  MWU:[id number] DOB: 03/23/1941 DOA: 11/18/2021 ?PCP: [name], MD  ? ? ?Brief Narrative:  ?Patient is a 81 years old female with past medical history of coronary artery disease status post CABG, iron deficiency anemia, GI bleed, COPD, GERD, previous TIA, carotid artery disease status post CEA feb.  14, 23 presented to hospital with right-sided numbness which began on the evening of 11/17/2021.  Patient also subsequently developed right facial numbness and headache.  In the ED vitals were stable.  POC glucose was 250.  Creatinine was 1.5.  Baseline between 1.6-1.7.  Hemoglobin of 9.9.  UA was negative.  UDS was negative.  CT head and without hemorrhage.  Patient was then considered for admission to the hospital for stroke work-up. ? ?Assessment and Plan: ? ?Possible CVA (cerebral vascular accident) ([place]) ?History of bilateral carotid endarterectomy and surgery for Chiari malformation years ago. She has known intracranial aneurysm and has followed with Dr. Bronson Curb.  CT head scan was negative for intracranial bleed.  MRI of the brain did not not show any acute stroke.  Lipid panel showed low HDL at 40 with LDL cholesterol of 88, hemoglobin A1c of 5.6.  2D echocardiogram with LV ejection fraction of 60 to 65% with grade 1 diastolic dysfunction.  Carotid duplex ultrasound has been ordered pending at this time.  She has been seen by physical therapy and occupational therapy who recommended home health PT on discharge.  MRI of the head has been ordered pending at this time. ?  ?Chronic diastolic CHF (congestive heart failure) ([place]) ?2D echocardiogram from 06/14/20 shows EF 60-65%, Grade II DDD.  Compensated at this time.  Follows up with Dr. Terrence Dupont as outpatient.  Follow 2D echocardiogram at this time. ?  ?Iron deficiency anemia ?Patient is being followed by [name] as outpatient.  Hemoglobin currently at baseline. ?  ?Essential hypertension ?Blood pressure stable.  Continue ARB  from home. ?  ?HLD (hyperlipidemia) ?Continue statins. ?  ?CAD (coronary artery disease) ?With Dr. Fletcher Anon as outpatient.  Continue Lipitor Plavix and ARB. ? ?Right-sided carotid artery disease ([place]) ?S/P right CEA Feb 14, '23. Seen on 10/17/21 for follow up as outpatient and doing well. 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  ?COPD GOLD II ?CODP GOLD II -continue inhalers. ? ?Right groin pain.  X-ray of the hip negative.  Patient does have chronic back issues.  Possible atypical lumbar radiculopathy.  Could be worked up as outpatient. ?  ? DVT prophylaxis: enoxaparin (LOVENOX) injection 30 mg Start: 11/18/21 1730 ? ? ?Code Status:   ?  Code Status: DNR ? ?Disposition: Home with home health ?Status is: Inpatient ?Remains inpatient appropriate because: Stroke work-up. ? Family Communication: Spoke with the patient at bedside. ? ?Consultants:  ?Neurology ? ?Procedures:  ?None ? ?Antimicrobials:  ?None ? ?Anti-infectives (From admission, onward)  ? ? None  ? ?  ? ? ?Subjective: ?Today, patient was seen and examined at bedside.  Patient complains of mild groin pain.  Complains of chronic back issues.  Numbness in the face has improved ? ?Objective: ?Vitals:  ? 11/19/21 0046 11/19/21 0410 11/19/21 0717 11/19/21 1123  ?BP: (!) 94/51 (!) 132/42 115/60 (!) 134/57  ?Pulse: (!) 55 (!) 51 (!) 47 (!) 54  ?Resp: 18 18 18 18   ?Temp: 98.2 ?F (36.8 ?C) 97.7 ?F (36.5 ?C) 98 ?F (36.7 ?C) 97.8 ?F (36.6 ?C)  ?TempSrc: Oral Oral Oral Oral  ?SpO2: 97% 95% 97% 96%  ?Weight:      ?  Height:      ? ? ?Intake/Output Summary (Last 24 hours) at 11/19/2021 1410 ?Last data filed at 11/19/2021 0900 ?Gross per 24 hour  ?Intake 873.36 ml  ?Output --  ?Net 873.36 ml  ? ?Filed Weights  ? 11/18/21 1247  ?Weight: 65.8 kg  ? ? ?Physical Examination: ?Body mass index is 26.52 kg/m?.  ? ?General:  Average built, not in obvious distress ?HENT:   No scleral pallor or icterus noted. Oral mucosa is moist.  ?Chest:  Clear breath sounds.  Diminished breath sounds  bilaterally. No crackles or wheezes.  ?CVS: S1 &S2 heard. No murmur.  Regular rate and rhythm. ?Abdomen: Soft, nontender, nondistended.  Bowel sounds are heard.   ?Extremities: No cyanosis, clubbing or edema.  Peripheral pulses are palpable.  Mild tenderness over the groin. ?Psych: Alert, awake and oriented, normal mood ?CNS:  No cranial nerve deficits.  Power equal in all extremities.   ?Skin: Warm and dry.  No rashes noted. ? ?Data Reviewed:  ? ?CBC: ?Recent Labs  ?Lab 11/18/21 ?1245 11/18/21 ?1322  ?WBC 6.8  --   ?NEUTROABS 3.4  --   ?HGB 9.9* 9.9*  ?HCT 31.6* 29.0*  ?MCV 90.5  --   ?PLT 265  --   ? ? ?Basic Metabolic Panel: ?Recent Labs  ?Lab 11/18/21 ?1245 11/18/21 ?1322  ?NA 138 139  ?K 4.0 4.1  ?CL 106 104  ?CO2 24  --   ?GLUCOSE 153* 150*  ?BUN 25* 29*  ?CREATININE 1.46* 1.50*  ?CALCIUM 9.3  --   ?MG 2.0  --   ? ? ?Liver Function Tests: ?Recent Labs  ?Lab 11/18/21 ?1245  ?AST 22  ?ALT 12  ?ALKPHOS 74  ?BILITOT 0.4  ?PROT 6.8  ?ALBUMIN 3.4*  ? ? ? ?Radiology Studies: ?CT HEAD WO CONTRAST ? ?Result Date: 11/18/2021 ?CLINICAL DATA:  Numbness and tingling on right hip last night, moving to leg, then to arm and face this morning with headache. EXAM: CT HEAD WITHOUT CONTRAST TECHNIQUE: Contiguous axial images were obtained from the base of the skull through the vertex without intravenous contrast. RADIATION DOSE REDUCTION: This exam was performed according to the departmental dose-optimization program which includes automated exposure control, adjustment of the mA and/or kV according to patient size and/or use of iterative reconstruction technique. COMPARISON:  Prior brain MR A's.  Head CT, 02/11/2019 FINDINGS: Brain: No evidence of acute infarction, hemorrhage, hydrocephalus, extra-axial collection or mass lesion/mass effect. Mild, age-appropriate, ventricular and sulcal enlargement. Minor periventricular white matter hypoattenuation consistent with chronic microvascular ischemic change. Vascular: No hyperdense  vessel or unexpected calcification. Skull: Normal. Negative for fracture or focal lesion. Sinuses/Orbits: Globes and orbits are unremarkable. Dependent fluid in the maxillary sinuses. Mild maxillary sinus mucosal thickening. Mild to moderate ethmoid sinus mucosal thickening. Mild mucosal thickening at floors of the frontal sinuses. Other: None. IMPRESSION: 1. No acute intracranial abnormalities. 2. Sinus disease including maxillary sinus air-fluid levels. Findings consistent with acute sinusitis in the proper clinical setting. Electronically Signed   By: Lajean Manes M.D.   On: 11/18/2021 13:20  ? ?[name] CONTRAST ? ?Result Date: 11/18/2021 ?CLINICAL DATA:  Transient ischemic attack. Right-sided numbness and tingling EXAM: MRI HEAD WITHOUT CONTRAST TECHNIQUE: Multiplanar, multiecho pulse sequences of the brain and surrounding structures were obtained without intravenous contrast. COMPARISON:  Head CT same day.  MRI 04/06/2020. FINDINGS: Brain: Diffusion imaging does not show any acute or subacute infarction. The brainstem and cerebellum are normal. Cerebral hemispheres show mild age related volume  loss. There is not a pattern of widespread small-vessel disease. No cortical or large vessel territory infarction. No mass lesion, hemorrhage, hydrocephalus or extra-axial collection. Vascular: Major vessels at the base of the brain show flow. Skull and upper cervical spine: Negative Sinuses/Orbits: Mucosal inflammatory changes of the maxillary and ethmoid sinuses. Small air-fluid levels in the maxillary sinuses. Other: None IMPRESSION: Normal appearance of the brain for age. Mild age related volume loss without evidence of old or acute small or large vessel infarction. Mucosal inflammatory changes of the maxillary and ethmoid sinuses, with small maxillary sinus air-fluid levels. Electronically Signed   By: Nelson Chimes M.D.   On: 11/18/2021 19:07  ? ?ECHOCARDIOGRAM COMPLETE ? ?Result Date: 11/19/2021 ?   ECHOCARDIOGRAM  REPORT   Patient Name:   [name] Date of Exam: 11/19/2021 Medical Rec #:  [id number]      Height:       62.0 in Accession #:    [id number]     Weight:       145.0 lb Date of Birth:  01-10-1941      BSA:

## 2021-11-19 NOTE — Care Management CC44 (Signed)
Condition Code 44 Documentation Completed ? ?Patient Details  ?Name: [name] ?MRN: [id number] ?Date of Birth: 1941-06-06 ? ? ?Condition Code 44 given:  Yes ?Patient signature on Condition Code 44 notice:  Yes ?Documentation of 2 MD's agreement:  Yes ?Code 44 added to claim:  Yes ? ? ? Laurena Slimmer, RN ?11/19/2021, 6:53 PM ? ?

## 2021-11-19 NOTE — Progress Notes (Signed)
Echocardiogram ?2D Echocardiogram has been performed. ? ?Oneal Deputy Azaryah Oleksy RDCS ?11/19/2021, 1:25 PM ?

## 2021-11-19 NOTE — Care Management Obs Status (Signed)
MEDICARE OBSERVATION STATUS NOTIFICATION ? ? ?Patient Details  ?Name: [name] ?MRN: [id number] ?Date of Birth: 1941/06/22 ? ? ?Medicare Observation Status Notification Given:  Yes ? ? ? Laurena Slimmer, RN ?11/19/2021, 6:53 PM ?

## 2021-11-19 NOTE — Progress Notes (Signed)
Nutrition Brief Note ? ?Patient identified on the Malnutrition Screening Tool (MST) Report. ? ?Admitting Dx: Numbness [R20.0] ?CVA (cerebral vascular accident) [facility]) [I63.9] ?PMH:  ?Past Medical History:  ?Diagnosis Date  ? 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 Asthma   ? 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  ? Blood in stool   ? last week of aug 2018  ? Carotid artery occlusion   ? Chronic kidney disease   ? Complication of anesthesia   ? COPD (chronic obstructive pulmonary disease) ([place])   ? Coronary artery disease   ? 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  ? Diverticulitis   ? Dyspnea   ? with exertion  ? Gastroesophageal reflux disease   ? occ  ? Glaucoma   ? right eye  ? Headache   ? tension  ? Headache syndrome 11/27/2018  ? Hiatal hernia   ? Hyperlipidemia   ? Hypertension   ? Iliac artery aneurysm ([place])   ? Lung cancer ([place]) dx 2018  ? 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 Myocardial infarction ([place]) 01/01/2000  ? Cardiac catheterization  ? Peripheral vascular disease ([place])   ? stents in legs x 2 or 3  ? Pneumonia   ? last time winter 2017 -2018  ? PONV (postoperative nausea and vomiting)   ? occassionally, last colonscopy did ok with anesthesia  ? Reflux   ? Wears dentures   ? Full set  ? Wears glasses   ? ?Medications:  ? furosemide  40 mg Oral Daily  ? magnesium oxide  400 mg Oral q AM  ? pantoprazole  40 mg Oral Daily  ? ?Labs: ?Recent Labs  ?Lab 11/18/21 ?1245 11/18/21 ?1322  ?NA 138 139  ?K 4.0 4.1  ?CL 106 104  ?CO2 24  --   ?BUN 25* 29*  ?CREATININE 1.46* 1.50*  ?CALCIUM 9.3  --   ?MG 2.0  --   ?GLUCOSE 153* 150*  ? ?Wt Readings from Last 15 Encounters:  ?11/18/21 65.8 kg  ?10/19/21 67.7 kg  ?09/29/21 71 kg  ?09/22/21 70.4 kg  ?09/20/21 69.7 kg  ?09/14/21 71.2 kg  ?09/13/21 69.5 kg  ?09/06/21 70.1 kg  ?08/31/21 69.5 kg  ?08/25/21 69 kg  ?05/22/21 68.3 kg  ?03/01/21 66.7 kg  ?01/05/21 65.8 kg   ?11/14/20 68 kg  ?11/08/20 67.1 kg  ? ?Body mass index is 26.52 kg/m?Marland Kitchen Patient meets criteria for overweight based on current BMI.  ? ?Current diet order is heart healthy, patient is consuming approximately 100% of meals at this time.  ? ?No nutrition interventions warranted at this time. If nutrition issues arise, please consult RD.  ? ? ?Theone Stanley., MS, RD, LDN (she/her/hers) ?RD pager number and weekend/on-call pager number located in [place]. ? ? ? ? ?

## 2021-11-19 NOTE — Progress Notes (Addendum)
STROKE TEAM PROGRESS NOTE  ? ?INTERVAL HISTORY ?Her great niece is at the bedside.   ?She reports groin pain lasted about 2  hours, still sore with right leg lifting and with palpitation- xray ordered by primary team negative ? Noticed numbness in right face and right leg that has resolved.  ? ? ?Vitals:  ? 11/18/21 1921 11/19/21 0046 11/19/21 0410 11/19/21 0717  ?BP: (!) 114/51 (!) 94/51 (!) 132/42 115/60  ?Pulse: (!) 57 (!) 55 (!) 51 (!) 47  ?Resp: 18 18 18 18   ?Temp: 97.9 ?F (36.6 ?C) 98.2 ?F (36.8 ?C) 97.7 ?F (36.5 ?C) 98 ?F (36.7 ?C)  ?TempSrc: Oral Oral Oral Oral  ?SpO2: 97% 97% 95% 97%  ?Weight:      ?Height:      ? ?CBC:  ?Recent Labs  ?Lab 11/18/21 ?1245 11/18/21 ?1322  ?WBC 6.8  --   ?NEUTROABS 3.4  --   ?HGB 9.9* 9.9*  ?HCT 31.6* 29.0*  ?MCV 90.5  --   ?PLT 265  --   ? ?Basic Metabolic Panel:  ?Recent Labs  ?Lab 11/18/21 ?1245 11/18/21 ?1322  ?NA 138 139  ?K 4.0 4.1  ?CL 106 104  ?CO2 24  --   ?GLUCOSE 153* 150*  ?BUN 25* 29*  ?CREATININE 1.46* 1.50*  ?CALCIUM 9.3  --   ?MG 2.0  --   ? ?Lipid Panel:  ?Recent Labs  ?Lab 11/19/21 ?0430  ?CHOL 150  ?TRIG 109  ?HDL 40*  ?CHOLHDL 3.8  ?VLDL 22  ?[place] 88  ? ?HgbA1c:  ?Recent Labs  ?Lab 11/19/21 ?0430  ?HGBA1C 5.6  ? ?Urine Drug Screen:  ?Recent Labs  ?Lab 11/18/21 ?1307  ?LABOPIA NONE DETECTED  ?COCAINSCRNUR NONE DETECTED  ?LABBENZ NONE DETECTED  ?AMPHETMU NONE DETECTED  ?THCU NONE DETECTED  ?LABBARB NONE DETECTED  ?  ?Alcohol Level  ?Recent Labs  ?Lab 11/18/21 ?1245  ?ETH <10  ? ? ?IMAGING past 24 hours ?CT HEAD WO CONTRAST ? ?Result Date: 11/18/2021 ?CLINICAL DATA:  Numbness and tingling on right hip last night, moving to leg, then to arm and face this morning with headache. EXAM: CT HEAD WITHOUT CONTRAST TECHNIQUE: Contiguous axial images were obtained from the base of the skull through the vertex without intravenous contrast. RADIATION DOSE REDUCTION: This exam was performed according to the departmental dose-optimization program which includes automated  exposure control, adjustment of the mA and/or kV according to patient size and/or use of iterative reconstruction technique. COMPARISON:  Prior brain MR A's.  Head CT, 02/11/2019 FINDINGS: Brain: No evidence of acute infarction, hemorrhage, hydrocephalus, extra-axial collection or mass lesion/mass effect. Mild, age-appropriate, ventricular and sulcal enlargement. Minor periventricular white matter hypoattenuation consistent with chronic microvascular ischemic change. Vascular: No hyperdense vessel or unexpected calcification. Skull: Normal. Negative for fracture or focal lesion. Sinuses/Orbits: Globes and orbits are unremarkable. Dependent fluid in the maxillary sinuses. Mild maxillary sinus mucosal thickening. Mild to moderate ethmoid sinus mucosal thickening. Mild mucosal thickening at floors of the frontal sinuses. Other: None. IMPRESSION: 1. No acute intracranial abnormalities. 2. Sinus disease including maxillary sinus air-fluid levels. Findings consistent with acute sinusitis in the proper clinical setting. Electronically Signed   By: Lajean Manes M.D.   On: 11/18/2021 13:20  ? ?[name] CONTRAST ? ?Result Date: 11/18/2021 ?CLINICAL DATA:  Transient ischemic attack. Right-sided numbness and tingling EXAM: MRI HEAD WITHOUT CONTRAST TECHNIQUE: Multiplanar, multiecho pulse sequences of the brain and surrounding structures were obtained without intravenous contrast. COMPARISON:  Head CT same day.  MRI 04/06/2020. FINDINGS: Brain: Diffusion imaging does not show any acute or subacute infarction. The brainstem and cerebellum are normal. Cerebral hemispheres show mild age related volume loss. There is not a pattern of widespread small-vessel disease. No cortical or large vessel territory infarction. No mass lesion, hemorrhage, hydrocephalus or extra-axial collection. Vascular: Major vessels at the base of the brain show flow. Skull and upper cervical spine: Negative Sinuses/Orbits: Mucosal inflammatory changes of  the maxillary and ethmoid sinuses. Small air-fluid levels in the maxillary sinuses. Other: None IMPRESSION: Normal appearance of the brain for age. Mild age related volume loss without evidence of old or acute small or large vessel infarction. Mucosal inflammatory changes of the maxillary and ethmoid sinuses, with small maxillary sinus air-fluid levels. Electronically Signed   By: Nelson Chimes M.D.   On: 11/18/2021 19:07   ? ?PHYSICAL EXAM ? ?Physical Exam  ?Constitutional: Appears well-developed and well-nourished.  ?Cardiovascular: Normal rate and regular rhythm.  ?Respiratory: Effort normal, non-labored breathing ? ?Neuro: ?Mental Status: ?Patient is awake, alert, oriented to person, place, month, year, and situation. ?Patient is able to give a clear and coherent history. ?No signs of aphasia or neglect ?Cranial Nerves: ?II: Visual Fields are full. Pupils are equal, round, and reactive to light.   ?III,IV, VI: EOMI without ptosis or diploplia.  ?V: Facial sensation is symmetric to temperature ?VII: Facial movement is symmetric resting and smiling ?VIII: Hearing is intact to voice ?X: Palate elevates symmetrically ?XI: Shoulder shrug is symmetric. ?XII: Tongue protrudes midline without atrophy or fasciculations.  ?Motor: ?Tone is normal. Bulk is normal. 5/5 strength was present in bilateral upper extremities ? ?Sensory: ?Sensation equal in right face. and right foot. Temperature sensation intact ?DTR ?Diminished in bilateral knees  1+ ?Cerebellar: ?FNF and HKS are intact bilaterally ? ? ?ASSESSMENT/PLAN ?Ms. NASIA CANNAN is a 81 y.o. female with history of CAD s/p MI, A-fib, s/p right CEA (09/26/2021), HTN, HLD, Chiari malformation, Right MCA aneurysm followed by Dr. Estanislado Pandy COPD, GERD, Lung and skin cancer, CKD, glaucoma, headaches, PVD with stents in legs, neuropathy who presents for intermittent spells of numbness of the right side of her body. Which began the evening of 11/17/21. BP was 220/100 on admission.  Recently admitted by [facility] Dr. Scot Dock for a Right CEA on 09/26/2021. Numbness has began to resolve. She did not notice any weakness or difficulty ambulating.  ? ?TIA vs CVA not seen on imaging   ?Code Stroke CT head No acute abnormality. Small vessel disease. Atrophy. ASPECTS 10.    ?MRI  no acute abnormality ?MRA head pending ?Carotid US pending ?2D Echo Pending ?LDL 88 ?HgbA1c 5.6 ?VTE prophylaxis - Lovenox 30mg  ?clopidogrel 75 mg daily prior to admission, now on clopidogrel 75 mg daily. Patient is not able to take aspirin due to GI bleed history. ?Therapy recommendations:  Home Health OT/PT ?Disposition:  Pending ? ?Hypertension ?Home meds:  ARB ?Stable ?Permissive hypertension (OK if < 220/120) but gradually normalize in 5-7 days ?Long-term BP goal normotensive ? ?Hyperlipidemia ?Home meds:  Atorvastatin 40mg , resumed in hospital ?LDL 88, goal < 70 ?Continue statin at discharge ? ?Other Stroke Risk Factors ?Advanced Age >/= 74  ?CAD ?Dr. Fletcher Anon OP- cont plavix ?Congestive heart failure- diastolic ?Lasix ? ?Other Active Problems ?Right sided carotid artery disease ?Rt CEA 2/14 with Dr. Scot Dock ?Vessel imaging pending ?Right groin pain with radiculopathy in right leg ?Started Friday  ?Hip xray negative ?COPD ?Rt MCA Aneurysm ?Follows with Dr. Estanislado Pandy ? ?Hospital day # 1 ? ?  Patient seen and examined by NP/APP with MD. MD to update note as needed.  ? ?Janine Ores, DNP, FNP-BC ?[facility] ?Pager: [phone] ? ? ?Changes right-sided numbness and heaviness.  Work-up is pending.  Examination is back to baseline.  Typically with recommend dual antiplatelet agents but apparently has had issues with GI bleed from aspirin previously and does not want to take this medication.  Continue with Plavix.  She has a history of right groin pain over the last several weeks.  The pain radiates down the right lower extremity suggestive of radiculopathy.  Exam does not suggest a inguinal hernia.  Lumbar spine imaging  may be warranted if pain persists. ? ? ? ?To contact Stroke Continuity provider, please refer to [email or web address] ?After hours, contact General Neurology ? ?

## 2021-11-19 NOTE — Evaluation (Signed)
Physical Therapy Evaluation ?Patient Details ?Name: [name] ?MRN: [id number] ?DOB: [birth year] ?Today's Date: 11/19/2021 ? ?History of Present Illness ? Pt is a 51 female admitted 4/8 with c/o R side numbness. Symptoms started with numbness and tingling and pain in her right hip. Testing for CVA vs TIA. MRI negative. PMH:  CAD s/p CABG, iron deficiency anemia, Gi bleed, COPD, GERD ?  ?Clinical Impression ? Pt admitted with above diagnosis. PTA pt lived at home with family, mod I/I mobility and ADLs without AD. Pt currently with functional limitations due to the deficits listed below (see PT Problem List). On eval, she required supervision bed mobility, min guard assist transfers, and min guard assist ambulation 125' pushing IV pole. Pt reaching for handrail and passing objects/furniture. Reports furniture walking at home. Declining use of AD, although she has DME if needed. Primary c/o R hip pain resulting in antalgic gait. Pt reporting no numbness RLE. Pt will benefit from skilled PT to increase their independence and safety with mobility to allow discharge to the venue listed below.   ?   ?   ? ?Recommendations for follow up therapy are one component of a multi-disciplinary discharge planning process, led by the attending physician.  Recommendations may be updated based on patient status, additional functional criteria and insurance authorization. ? ?Follow Up Recommendations Home health PT ? ?  ?Assistance Recommended at Discharge PRN  ?Patient can return home with the following ? Assist for transportation;Help with stairs or ramp for entrance;Assistance with cooking/housework ? ?  ?Equipment Recommendations None recommended by PT  ?Recommendations for Other Services ?    ?  ?Functional Status Assessment Patient has had a recent decline in their functional status and demonstrates the ability to make significant improvements in function in a reasonable and predictable amount of time.  ? ?  ?Precautions /  Restrictions Precautions ?Precautions: Fall  ? ?  ? ?Mobility ? Bed Mobility ?Overal bed mobility: Needs Assistance ?Bed Mobility: Supine to Sit ?  ?  ?Supine to sit: Supervision, HOB elevated ?  ?  ?General bed mobility comments: +rail ?  ? ?Transfers ?Overall transfer level: Needs assistance ?Equipment used: None ?Transfers: Sit to/from Stand ?Sit to Stand: Min guard ?  ?  ?  ?  ?  ?General transfer comment: increased time to power up and stabilize balance ?  ? ?Ambulation/Gait ?Ambulation/Gait assistance: Min guard ?Gait Distance (Feet): 125 Feet ?Assistive device: IV Pole ?Gait Pattern/deviations: Step-through pattern, Decreased stride length, Antalgic, Decreased weight shift to right ?Gait velocity: decreased ?Gait velocity interpretation: <1.31 ft/sec, indicative of household ambulator ?  ?General Gait Details: Pt reaching for handrails and passing furniture for stability. Reports furniture walking at home due to her desire to not use AD. Mildly unsteady but no physical assist needed. ? ?Stairs ?  ?  ?  ?  ?  ? ?Wheelchair Mobility ?  ? ?Modified Rankin (Stroke Patients Only) ?  ? ?  ? ?Balance Overall balance assessment: Needs assistance ?Sitting-balance support: No upper extremity supported, Feet supported ?Sitting balance-Leahy Scale: Good ?Sitting balance - Comments: Pt sat EOB to don socks. ?  ?Standing balance support: No upper extremity supported, Single extremity supported, During functional activity ?Standing balance-Leahy Scale: Fair ?  ?  ?  ?  ?  ?  ?  ?  ?  ?  ?  ?  ?   ? ? ? ?Pertinent Vitals/Pain    ? ? ?Home Living Family/patient expects to be discharged to:: Private  residence ?Living Arrangements: Children (son and grandson) ?Available Help at Discharge: Family;Available 24 hours/day ?Type of Home: House ?Home Access: Stairs to enter ?Entrance Stairs-Rails: Right;Left;Can reach both ?Entrance Stairs-Number of Steps: 8 ?  ?Home Layout: One level ?Home Equipment: Rolling Walker (2  wheels);BSC/3in1;Shower seat;Cane - single point;Wheelchair - manual ?   ?  ?Prior Function Prior Level of Function : Independent/Modified Independent ?  ?  ?  ?  ?  ?  ?Mobility Comments: Primarily household ambulator. Does occasionally go to grocery store with her son, using shopping cart for amb. ?  ?  ? ? ?Hand Dominance  ? Dominant Hand: Right ? ?  ?Extremity/Trunk Assessment  ? Upper Extremity Assessment ?Upper Extremity Assessment: Defer to OT evaluation ?  ? ?Lower Extremity Assessment ?Lower Extremity Assessment: Generalized weakness ?  ? ?Cervical / Trunk Assessment ?Cervical / Trunk Assessment: Kyphotic  ?Communication  ? Communication: No difficulties  ?Cognition Arousal/Alertness: Awake/alert ?Behavior During Therapy: [facility] for tasks assessed/performed ?Overall Cognitive Status: Within Functional Limits for tasks assessed ?  ?  ?  ?  ?  ?  ?  ?  ?  ?  ?  ?  ?  ?  ?  ?  ?  ?  ?  ? ?  ?General Comments   ? ?  ?Exercises    ? ?Assessment/Plan  ?  ?PT Assessment Patient needs continued PT services  ?PT Problem List Decreased strength;Decreased mobility;Decreased activity tolerance;Pain;Decreased balance ? ?   ?  ?PT Treatment Interventions DME instruction;Therapeutic activities;Gait training;Therapeutic exercise;Patient/family education;Stair training;Balance training;Functional mobility training   ? ?PT Goals (Current goals can be found in the Care Plan section)  ?Acute Rehab PT Goals ?Patient Stated Goal: home ?PT Goal Formulation: With patient ?Time For Goal Achievement: 12/03/21 ?Potential to Achieve Goals: Good ? ?  ?Frequency Min 3X/week ?  ? ? ?Co-evaluation   ?  ?  ?  ?  ? ? ?  ?AM-PAC PT "6 Clicks" Mobility  ?Outcome Measure Help needed turning from your back to your side while in a flat bed without using bedrails?: None ?Help needed moving from lying on your back to sitting on the side of a flat bed without using bedrails?: A Little ?Help needed moving to and from a bed to a chair (including a  wheelchair)?: A Little ?Help needed standing up from a chair using your arms (e.g., wheelchair or bedside chair)?: A Little ?Help needed to walk in hospital room?: A Little ?Help needed climbing 3-5 steps with a railing? : A Lot ?6 Click Score: 18 ? ?  ?End of Session Equipment Utilized During Treatment: Gait belt ?Activity Tolerance: Patient tolerated treatment well ?Patient left: in chair;with call bell/phone within reach ?Nurse Communication: Mobility status ?PT Visit Diagnosis: Unsteadiness on feet (R26.81);Pain;Muscle weakness (generalized) (M62.81) ?Pain - Right/Left: Right ?Pain - part of body: Hip ?  ? ?Time: 6314-9702 ?PT Time Calculation (min) (ACUTE ONLY): 14 min ? ? ?Charges:   PT Evaluation ?$PT Eval Moderate Complexity: 1 Mod ?  ?  ?   ? ? ?Lorrin Goodell, PT  ?Office # [phone] ?Pager [phone] ? ? ?Lorriane Shire ?11/19/2021, 9:03 AM ? ?

## 2021-11-19 NOTE — Evaluation (Signed)
Occupational Therapy Evaluation ?Patient Details ?Name: [name] ?MRN: [id number] ?DOB: [birth year] ?Today's Date: 11/19/2021 ? ? ?History of Present Illness Pt is a 75 female admitted 4/8 with c/o R side numbness. Symptoms started with numbness and tingling and pain in her right hip. Testing for CVA vs TIA. MRI negative. PMH:  CAD s/p CABG, iron deficiency anemia, Gi bleed, COPD, GERD  ? ?Clinical Impression ?  ?[name] was evaluated s/p the above admission list, she is generally indep at baseline but does not drive due to low vision. She lives with her son and grandson who assist with heavy IADLs as needed. Upon evaluation pt was generally min G for functional mobility without AD and ADLs. She is limited by generalized weakness and her painful R hip which causes her to be unsteady. Pt will benefit form OT acutely. Recommend d/c home with [place] pending pt progression actuely.  ?   ? ?Recommendations for follow up therapy are one component of a multi-disciplinary discharge planning process, led by the attending physician.  Recommendations may be updated based on patient status, additional functional criteria and insurance authorization.  ? ?Follow Up Recommendations ? Home health OT  ?  ?Assistance Recommended at Discharge Intermittent Supervision/Assistance  ?Patient can return home with the following A little help with walking and/or transfers;A little help with bathing/dressing/bathroom;Assist for transportation ? ?  ?Functional Status Assessment ? Patient has had a recent decline in their functional status and demonstrates the ability to make significant improvements in function in a reasonable and predictable amount of time.  ?Equipment Recommendations ? None recommended by OT  ?  ?   ?Precautions / Restrictions Precautions ?Precautions: Fall ?Restrictions ?Weight Bearing Restrictions: No  ? ?  ? ?Mobility Bed Mobility ?Overal bed mobility: Needs Assistance ?  ?  ?  ?  ?  ?  ?General bed mobility comments: in  chair upon arrival ?  ? ?Transfers ?Overall transfer level: Needs assistance ?Equipment used: None ?Transfers: Sit to/from Stand ?Sit to Stand: Min guard ?  ?  ?  ?  ?  ?General transfer comment: increased time to power up and stabilize balance ?  ? ?  ?Balance Overall balance assessment: Needs assistance ?Sitting-balance support: No upper extremity supported, Feet supported ?Sitting balance-Leahy Scale: Good ?  ?  ?Standing balance support: No upper extremity supported, Single extremity supported, During functional activity ?Standing balance-Leahy Scale: Fair ?  ?  ?  ?  ?  ?  ?  ?  ?  ?  ?  ?  ?   ? ?ADL either performed or assessed with clinical judgement  ? ?ADL Overall ADL's : Needs assistance/impaired ?Eating/Feeding: Independent;Sitting ?  ?Grooming: Min guard;Standing ?Grooming Details (indicate cue type and reason): groomed at the sink ?Upper Body Bathing: Set up;Sitting ?  ?Lower Body Bathing: Min guard;Sit to/from stand ?  ?Upper Body Dressing : Set up;Sitting ?  ?Lower Body Dressing: Min guard;Sit to/from stand ?  ?Toilet Transfer: Min guard;Ambulation ?Toilet Transfer Details (indicate cue type and reason): holding on to IV pole ?Toileting- Clothing Manipulation and Hygiene: Supervision/safety ?  ?  ?  ?Functional mobility during ADLs: Min guard ?General ADL Comments: slight bucklign of RLE and mild LOB, able to self correct  ? ? ? 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 baseline, no acute change.  ?   ? ? ? ? ?Hand Dominance Right ?  ?Extremity/Trunk Assessment Upper Extremity Assessment ?Upper Extremity Assessment: Generalized weakness ?  ?Lower Extremity Assessment ?Lower Extremity Assessment: Defer to PT evaluation (painful R hip/groin  area) ?  ?Cervical / Trunk Assessment ?Cervical / Trunk Assessment: Kyphotic ?  ?Communication Communication ?Communication: No difficulties ?  ?Cognition Arousal/Alertness: Awake/alert ?Behavior During Therapy: [facility] for tasks assessed/performed ?Overall Cognitive Status: Within Functional Limits for tasks assessed ?  ?  ?  ?  ?  ?  ?  ?  ?  ?  ?  ?  ?  ?  ?  ?  ?  ?  ?  ?General Comments  VSS on RA, pt with c/o pain in R hip ? ?  ? ?Home Living Family/patient expects to be discharged to:: Private residence ?Living Arrangements: Children ?Available Help at Discharge: Family;Available 24 hours/day ?Type of Home: House ?Home Access: Stairs to enter ?Entrance Stairs-Number of Steps: 8 ?Entrance Stairs-Rails: Right;Left;Can reach both ?Home Layout: One level ?  ?  ?Bathroom Shower/Tub: Gaffer;Door ?  ?Bathroom Toilet: Standard ?  ?  ?Home Equipment: Rolling Walker (2 wheels);BSC/3in1;Shower seat;Cane - single point;Wheelchair - manual ?  ?  ?  ? ?  ?Prior Functioning/Environment Prior Level of Function : Independent/Modified Independent ?  ?  ?  ?  ?  ?  ?Mobility Comments: Primarily household ambulator. Does occasionally go to grocery store with her son, using shopping cart for amb. ?ADLs Comments: generally indep, will use DME if she feels like she needs it. does her own meds, son completes the heavy housework ?  ? ?  ?  ?OT Problem List: Decreased strength;Decreased range of motion;Decreased activity tolerance;Impaired balance (sitting and/or standing);Decreased safety awareness;Decreased knowledge of precautions;Decreased knowledge of use of DME or AE ?  ?   ?OT Treatment/Interventions: Self-care/ADL training;Therapeutic exercise;Balance training;Patient/family education;Therapeutic activities;Energy conservation;DME and/or AE instruction  ?  ?OT Goals(Current goals can be found in the care plan section) Acute Rehab OT Goals ?Patient Stated Goal: home ?OT Goal Formulation: With patient ?Time For Goal  Achievement: 12/03/21 ?Potential to Achieve Goals: Good ?ADL Goals ?Pt Will Perform Lower Body Bathing: Independently;sit to/from stand ?Pt Will Transfer to Toilet: Independently;ambulating ?Pt Will Perform Tub/Shower Transfer: with modified independence  ?OT Frequency: Min 2X/week ?  ? ?   ?AM-PAC OT "6 Clicks" Daily Activity     ?Outcome Measure Help from another person eating meals?: None ?Help from another person taking care of personal grooming?: A Little ?Help from another person toileting, which includes using toliet, bedpan, or urinal?: A Little ?Help from another person bathing (including washing, rinsing, drying)?: A Little ?Help from another person to put on and taking off regular upper body clothing?: None ?Help from another person to put on and taking off regular lower body clothing?: A Little ?6 Click Score: 20 ?  ?End of Session Equipment Utilized During Treatment: Gait belt ?Nurse Communication: Mobility status ? ?Activity Tolerance: Patient tolerated treatment well ?Patient left: in chair;with call bell/phone within reach;with chair alarm set ? ?OT Visit Diagnosis: Unsteadiness on feet (R26.81);Other abnormalities of gait and mobility (R26.89);Muscle weakness (generalized) (M62.81);Pain  ?              ?Time: 3295-1884 ?OT Time Calculation (min): 19 min ?Charges:  OT General Charges ?$OT Visit: 1 Visit ?OT Evaluation ?$OT Eval Moderate Complexity: 1 Mod ? ? ?Diamante Rubin A Camreigh Michie ?11/19/2021, 10:40 AM ?

## 2021-11-19 NOTE — Progress Notes (Signed)
SLP Cancellation Note ? ?Patient Details ?Name: ALVARETTA EISENBERGER ?MRN: [id number] ?DOB: [birth year] ? ? ?Cancelled treatment:       Reason Eval/Treat Not Completed: SLP screened, no needs identified, will sign off. Per chart review and speaking briefly with patient, no speech, language or cognitive impairments observed. Thank you for this referral! ? ? ?Sonia Baller, MA, CCC-SLP ?Speech Therapy ? ?

## 2021-11-19 NOTE — Progress Notes (Signed)
Notified on call provider of HR and BP per parameter orders (sBP <100). Pt asymptomatic and also reviewed MRI results.  ? ?No new orders received at time will continue to monitor pt.  ?

## 2021-11-19 NOTE — Hospital Course (Addendum)
?  Patient is a 81 years old female with past medical history of coronary artery disease status post CABG, iron deficiency anemia, GI bleed, COPD, GERD, previous TIA, carotid artery disease status post CEA feb.  14, 25 presented to the hospital with right-sided numbness which began on the evening of 11/17/2021.  Patient also subsequently developed right facial numbness and headache.  In the ED, vitals were stable.  POC glucose was 250.  Creatinine was 1.5 with baseline between 1.6-1.7.  Hemoglobin was 9.9.  Urinalysis was negative.  UDS was negative.  CT head and without hemorrhage.  Patient was then considered for admission to the hospital for stroke work-up. ? ?Assessment and Plan: ? ?Numbness-possible TIA. ?History of bilateral carotid endarterectomy and surgery for Chiari malformation years ago. She has known intracranial aneurysm and has followed with Dr. Bronson Curb.  CT head scan was negative for intracranial bleed.  MRI of the brain did not not show any acute stroke.  Lipid panel showed low HDL at 40 with LDL cholesterol of 88, hemoglobin A1c of 5.6.  2D echocardiogram with LV ejection fraction of 60 to 65% with grade 1 diastolic dysfunction.  Carotid duplex ultrasound with 40 to 59% stenosis on the right with less than 39% stenosis on the left.  Patient was then  seen by physical therapy and occupational therapy who recommended home health PT on discharge.  Seen by neurology during hospitalization neurology and has initiated Pletal on discharge due to allergy to aspirin.  Patient will continue Plavix as well. ?  ?Chronic diastolic CHF (congestive heart failure) ([place]) ?2D echocardiogram from 06/14/20 showed EF 60-65%, Grade II DDD.  Compensated at this time.  2D echocardiogram from 11/19/2021 showed LV ejection fraction of 60 to 65% with no regional wall motion abnormality.  Follows up with Dr. Terrence Dupont, cardiology as outpatient.   ?  ?Iron deficiency anemia ?Patient is being followed by Dr. Benson Norway as outpatient.   Hemoglobin currently at baseline.  Hemoglobin prior to discharge was 9.9. ?  ?Essential hypertension ?Patient will resume olmesartan from home. ?  ?HLD (hyperlipidemia) ?Continue Lipitor on discharge. ?  ?CAD (coronary artery disease) ? Continue Lipitor Plavix and olmesartan from home ? ?Right-sided carotid artery disease ([place]) ?S/P right CEA Feb 14, '23. Seen on 10/17/21 for follow up as outpatient and doing well.  Carotid duplex ultrasound with 40 to 59% stenosis.  Patient has been started on aspirin and Pletal at this time ? ?Peripheral arterial disease.   ?Allergic to aspirin.  Pletal has been initiated. ?We will continue Plavix as well. ? ?COPD GOLD II ?CODP GOLD II -continue inhalers.  Currently appears compensated. ? ?Right groin pain.  X-ray of the hip negative.  Patient does have chronic back issues.  Possible atypical lumbar radiculopathy.  Denies any pain at this time. ? ?

## 2021-11-20 ENCOUNTER — Observation Stay (HOSPITAL_BASED_OUTPATIENT_CLINIC_OR_DEPARTMENT_OTHER): Payer: Medicare Other

## 2021-11-20 DIAGNOSIS — I5032 Chronic diastolic (congestive) heart failure: Secondary | ICD-10-CM | POA: Diagnosis not present

## 2021-11-20 DIAGNOSIS — G459 Transient cerebral ischemic attack, unspecified: Secondary | ICD-10-CM | POA: Diagnosis not present

## 2021-11-20 DIAGNOSIS — I6523 Occlusion and stenosis of bilateral carotid arteries: Secondary | ICD-10-CM | POA: Diagnosis not present

## 2021-11-20 DIAGNOSIS — I779 Disorder of arteries and arterioles, unspecified: Secondary | ICD-10-CM

## 2021-11-20 DIAGNOSIS — I2510 Atherosclerotic heart disease of native coronary artery without angina pectoris: Secondary | ICD-10-CM | POA: Diagnosis not present

## 2021-11-20 DIAGNOSIS — R200 Anesthesia of skin: Secondary | ICD-10-CM | POA: Diagnosis not present

## 2021-11-20 MED ORDER — CILOSTAZOL 100 MG PO TABS: 100.0000 mg | ORAL_TABLET | Freq: Two times a day (BID) | ORAL | 2 refills | Status: AC

## 2021-11-20 MED ORDER — CILOSTAZOL 100 MG PO TABS
100.0000 mg | ORAL_TABLET | Freq: Two times a day (BID) | ORAL | Status: DC
  Administered 2021-11-20: 100 mg via ORAL
  Filled 2021-11-20 (×2): qty 1

## 2021-11-20 NOTE — Progress Notes (Signed)
Discharge instructions discussed with patient. All questions answered. Patient transported off of unit via wheelchair for transport home by daughter.  ? ?Gwendolyn Grant, RN  ?

## 2021-11-20 NOTE — Progress Notes (Signed)
Carotid duplex has been completed.  ? ?Preliminary results in CV Proc.  ? ?Ameirah Khatoon Kylen Ismael ?11/20/2021 2:07 PM    ?

## 2021-11-20 NOTE — Discharge Summary (Addendum)
?Physician Discharge Summary ?  ?Patient: [name] MRN: [id number] DOB: May 23, 1941  ?Admit date:     11/18/2021  ?Discharge date: 11/20/21  ?Discharge Physician: Corrie Mckusick Tyrell Brereton  ? ?PCP: [name], MD  ? ?Recommendations at discharge:  ? ?Follow-up with your primary care provider in 1 to 2 weeks.   ?Follow-up with neurology as outpatient in 3 months. ?Due to aspirin allergy, patient has been started on Pletal.   ? ?Discharge Diagnoses: ?Principal Problem: ?  TIA (transient ischemic attack) ?Active Problems: ?  Numbness ?  Chronic diastolic CHF (congestive heart failure) ([place]) ?  COPD GOLD II ?  Right-sided carotid artery disease ([place]) ?  CAD (coronary artery disease) ?  HLD (hyperlipidemia) ?  Essential hypertension ?  Iron deficiency anemia ? ?Resolved Problems: ?  * No resolved hospital problems. * ? ?Hospital Course: ? ?Patient is a 81 years old female with past medical history of coronary artery disease status post CABG, iron deficiency anemia, GI bleed, COPD, GERD, previous TIA, carotid artery disease status post CEA feb.  14, 25 presented to the hospital with right-sided numbness which began on the evening of 11/17/2021.  Patient also subsequently developed right facial numbness and headache.  In the ED, vitals were stable.  POC glucose was 250.  Creatinine was 1.5 with baseline between 1.6-1.7.  Hemoglobin was 9.9.  Urinalysis was negative.  UDS was negative.  CT head and without hemorrhage.  Patient was then considered for admission to the hospital for stroke work-up. ? ?Assessment and Plan: ? ?Numbness-possible TIA. ?History of bilateral carotid endarterectomy and surgery for Chiari malformation years ago. She has known intracranial aneurysm and has followed with Dr. Bronson Curb.  CT head scan was negative for intracranial bleed.  MRI of the brain did not not show any acute stroke.  Lipid panel showed low HDL at 40 with LDL cholesterol of 88, hemoglobin A1c of 5.6.  2D echocardiogram with LV ejection  fraction of 60 to 65% with grade 1 diastolic dysfunction.  Carotid duplex ultrasound with 40 to 59% stenosis on the right with less than 39% stenosis on the left.  Patient was then  seen by physical therapy and occupational therapy who recommended home health PT on discharge.  Seen by neurology during hospitalization neurology and has initiated Pletal on discharge due to allergy to aspirin.  Patient will continue Plavix as well. ?  ?Chronic diastolic CHF (congestive heart failure) ([place]) ?2D echocardiogram from 06/14/20 showed EF 60-65%, Grade II DDD.  Compensated at this time.  2D echocardiogram from 11/19/2021 showed LV ejection fraction of 60 to 65% with no regional wall motion abnormality.  Follows up with Dr. Terrence Dupont, cardiology as outpatient.   ?  ?Iron deficiency anemia ?Patient is being followed by Dr. Benson Norway as outpatient.  Hemoglobin currently at baseline.  Hemoglobin prior to discharge was 9.9. ?  ?Essential hypertension ?Patient will resume olmesartan from home. ?  ?HLD (hyperlipidemia) ?Continue Lipitor on discharge. ?  ?CAD (coronary artery disease) ? Continue Lipitor Plavix and olmesartan from home ? ?Right-sided carotid artery disease ([place]) ?S/P right CEA Feb 14, '23. Seen on 10/17/21 for follow up as outpatient and doing well.  Carotid duplex ultrasound with 40 to 59% stenosis.  Patient has been started on aspirin and Pletal at this time ? ?Peripheral arterial disease.   ?Allergic to aspirin.  Pletal has been initiated. ?We will continue Plavix as well. ? ?COPD GOLD II ?CODP GOLD II -continue inhalers.  Currently appears compensated. ? ?Right groin  pain.  X-ray of the hip negative.  Patient does have chronic back issues.  Possible atypical lumbar radiculopathy.  Denies any pain at this time. ? ?Consultants: Neurology ? ?Procedures performed: None ? ?Disposition: Home health.  Communicated with the patient's daughter on the phone about the plan for disposition. ? ?Diet recommendation:  ?Discharge Diet Orders  (From admission, onward)  ? ?  Start     Ordered  ? 11/20/21 0000  Diet - low sodium heart healthy       ? 11/20/21 1443  ? ?  ?  ? ?  ? ?Cardiac diet ?DISCHARGE MEDICATION: ?Allergies as of 11/20/2021   ? ?   Reactions  ? Avelox [moxifloxacin Hcl In [place]acl] Palpitations, Other (See Comments)  ? Caused Heart Attack   ? Azithromycin Swelling, Other (See Comments)  ? Patient reported past history of lip swelling  ? Codeine Other (See Comments)  ? Dr. Terrence Dupont advised patient not to take this medication  ? Doxycycline Swelling, Other (See Comments)  ? Mouth, lips, feet swelling  ? Hydromorphone Palpitations, Other (See Comments)  ? DILAUDID  -  Pt had a Heart Attack after taking Dilaudid.  ? Levaquin [levofloxacin] Shortness Of Breath, Other (See Comments)  ? Chest pressure, SOB, "pain in between shoulder blades", sweaty -as reported by patient per experience in ED this afternoon  ? Vitamin D Analogs Swelling, Other (See Comments)  ? Face and lips swell, but no breathing issues  ? Nifedipine Er Other (See Comments)  ? Dropped the heart rate too low  ? Oxycodone-acetaminophen Other (See Comments)  ? Says it makes her "feel weird"  ? Risedronate Other (See Comments)  ? Chest pain  ? ?  ? ?  ?Medication List  ?  ? ?STOP taking these medications   ? ?amoxicillin 500 MG capsule ?Commonly known as: AMOXIL ?  ?magnesium oxide 400 (241.3 Mg) MG tablet ?Commonly known as: MAG-OX ?  ? ?  ? ?TAKE these medications   ? ?acetaminophen 500 MG tablet ?Commonly known as: TYLENOL ?Take 500-1,000 mg by mouth every 6 (six) hours as needed (for pain). ?  ?albuterol 108 (90 Base) MCG/ACT inhaler ?Commonly known as: VENTOLIN HFA ?Inhale 2 puffs into the lungs every 6 (six) hours as needed for wheezing or shortness of breath. ?  ?atorvastatin 40 MG tablet ?Commonly known as: LIPITOR ?Take 1 tablet (40 mg total) by mouth at bedtime. ?  ?cetirizine 10 MG tablet ?Commonly known as: ZYRTEC ?Take 10 mg by mouth daily as needed for allergies. ?   ?cilostazol 100 MG tablet ?Commonly known as: PLETAL ?Take 1 tablet (100 mg total) by mouth 2 (two) times daily. ?  ?clopidogrel 75 MG tablet ?Commonly known as: PLAVIX ?Take 1 tablet (75 mg total) by mouth daily. ?What changed: when to take this ?  ?dexlansoprazole 60 MG capsule ?Commonly known as: Dexilant ?Take 1 capsule (60 mg total) by mouth daily before breakfast. ?  ?furosemide 40 MG tablet ?Commonly known as: LASIX ?Take 40 mg by mouth daily. ?  ?Magnesium Oxide 400 MG Caps ?Take 400 mg by mouth in the morning. ?  ?nitroGLYCERIN 0.2 mg/hr patch ?Commonly known as: NITRODUR - Dosed in mg/24 hr ?Place 0.2 mg onto the skin in the morning. ?  ?nitroGLYCERIN 0.4 MG SL tablet ?Commonly known as: NITROSTAT ?Place 0.4 mg under the tongue every 5 (five) minutes x 3 doses as needed for chest pain. ?  ?olmesartan 20 MG tablet ?Commonly known as: Benicar ?Take 1  tablet (20 mg total) by mouth daily. ?What changed:  ?how much to take ?when to take this ?  ?ondansetron 4 MG disintegrating tablet ?Commonly known as: ZOFRAN-ODT ?Take 4 mg by mouth every 8 (eight) hours as needed for nausea or vomiting (dissolve in the mouth). ?  ?Trelegy Ellipta 200-62.5-25 MCG/ACT Aepb ?Generic drug: Fluticasone-Umeclidin-Vilant ?Inhale 1 puff into the lungs daily. ?  ? ?  ? ? Follow-up Information   ? ? Care, [facility] Follow up.   ?Specialty: Home Health Services ?Why: The home health agency will contact you for the first home visit. ?Contact information: ?[address] ?[address] ?[place] [address] ?[phone] ? ? ?  ?  ? ?  ?  ? ?  ? ?Subjective ?Today, patient was seen and examined at bedside.  Denies any numbness tingling or groin pain. ? ?Discharge Exam: ?Filed Weights  ? 11/18/21 1247  ?Weight: 65.8 kg  ? ?General:  Average built, not in obvious distress, elderly female, alert awake and communicative. ?HENT:   No scleral pallor or icterus noted. Oral mucosa is moist.  ?Chest:  Clear breath sounds.  Diminished breath  sounds bilaterally. No crackles or wheezes.  ?CVS: S1 &S2 heard. No murmur.  Regular rate and rhythm. ?Abdomen: Soft, nontender, nondistended.  Bowel sounds are heard.   ?Extremities: No cyanosis, clubbing or

## 2021-11-20 NOTE — TOC Transition Note (Signed)
Transition of Care (TOC) - CM/SW Discharge Note ? ? ?Patient Details  ?Name: [name] ?MRN: [id number] ?Date of Birth: [birth year] ? ?Transition of Care (TOC) CM/SW Contact:  ?Pollie Friar, RN ?Phone Number: ?11/20/2021, 11:58 AM ? ? ?Clinical Narrative:    ?Patient is discharging home with home health services through [facility]. Cory with [facility] has accepted the referral. Information on the AVS.  ?No DME needs.  ?Pt has at home: Walker/ cane/ 3 in 1/ wheelchair/ shower seat--she doesn't currently use any of them ?She denies issue with her home medications or with transportation. ?She has supervision at home. ? ? ?Final next level of care: [place] ?Barriers to Discharge: No Barriers Identified ? ? ?Patient Goals and CMS Choice ?  ?CMS Medicare.gov Compare Post Acute Care list provided to:: Patient ?Choice offered to / list presented to : Patient ? ?Discharge Placement ?  ?           ?  ?  ?  ?  ? ?Discharge Plan and Services ?  ?  ?           ?  ?  ?  ?  ?  ?HH Arranged: PT, OT ?[place] Agency: [place] ?Date HH Agency Contacted: 11/20/21 ?  ?Representative spoke with at [place]: Tommi Rumps ? ?Social Determinants of Health (SDOH) Interventions ?  ? ? ?Readmission Risk Interventions ?   ? View : No data to display.  ?  ?  ?  ? ? ? ? ? ?

## 2021-11-20 NOTE — Plan of Care (Signed)
Patient admitted on 11/18/2021 for right hip and lower leg/extremity weakness/numbness. CT and MRI have been done with negative results for stoke. Carotid Doppler completed with results in chart. Patient has followed treatment plan, teaching and has completed stated goals. Personal belongings at beside and obtained by patient, IV to Left arm discontinued, medications teaching provided, reviewed scheduled appointments with daughter at bedside. Discharged to home via daughters' transportation. ?

## 2021-11-20 NOTE — Progress Notes (Addendum)
STROKE TEAM PROGRESS NOTE  ? ?INTERVAL HISTORY ?Initially noticed numbness in right face and right leg that has resolved. She is unable to take aspirin due to hx of GI bleeding. Start Pletal 100mg  BID and continue Plavix 75mg  daily.  ?Carotid ultrasound shows 40 to 59% right ICA and 1-39% left ICA stenosis.  Echocardiogram shows 60-65% ejection fraction without cardiac source of embolism. ? ?Vitals:  ? 11/19/21 2100 11/19/21 2313 11/19/21 2317 11/20/21 0424  ?BP: (!) 100/51 (!) 115/36 (!) 116/47 (!) 101/51  ?Pulse: (!) 55 (!) 48 (!) 50 (!) 55  ?Resp: 16 16  15   ?Temp: 98.4 ?F (36.9 ?C) 98.3 ?F (36.8 ?C)  98.6 ?F (37 ?C)  ?TempSrc: Oral Oral  Axillary  ?SpO2: 96% 97%  97%  ?Weight:      ?Height:      ? ?CBC:  ?Recent Labs  ?Lab 11/18/21 ?1245 11/18/21 ?1322  ?WBC 6.8  --   ?NEUTROABS 3.4  --   ?HGB 9.9* 9.9*  ?HCT 31.6* 29.0*  ?MCV 90.5  --   ?PLT 265  --   ? ? ?Basic Metabolic Panel:  ?Recent Labs  ?Lab 11/18/21 ?1245 11/18/21 ?1322  ?NA 138 139  ?K 4.0 4.1  ?CL 106 104  ?CO2 24  --   ?GLUCOSE 153* 150*  ?BUN 25* 29*  ?CREATININE 1.46* 1.50*  ?CALCIUM 9.3  --   ?MG 2.0  --   ? ? ?Lipid Panel:  ?Recent Labs  ?Lab 11/19/21 ?0430  ?CHOL 150  ?TRIG 109  ?HDL 40*  ?CHOLHDL 3.8  ?VLDL 22  ?[place] 88  ? ? ?HgbA1c:  ?Recent Labs  ?Lab 11/19/21 ?0430  ?HGBA1C 5.6  ? ? ?Urine Drug Screen:  ?Recent Labs  ?Lab 11/18/21 ?1307  ?LABOPIA NONE DETECTED  ?COCAINSCRNUR NONE DETECTED  ?LABBENZ NONE DETECTED  ?AMPHETMU NONE DETECTED  ?THCU NONE DETECTED  ?LABBARB NONE DETECTED  ? ?  ?Alcohol Level  ?Recent Labs  ?Lab 11/18/21 ?1245  ?ETH <10  ? ? ? ?IMAGING past 24 hours ?MR ANGIO HEAD WO CONTRAST ? ?Result Date: 11/19/2021 ?CLINICAL DATA:  Neuro deficit, acute, stroke suspected. Right-sided numbness and tingling. EXAM: MRA HEAD WITHOUT CONTRAST TECHNIQUE: Angiographic images of the Circle of Willis were acquired using MRA technique without intravenous contrast. COMPARISON:  MRI brain yesterday. FINDINGS: Both internal carotid arteries  are widely patent into the brain. No siphon stenosis. The anterior and middle cerebral vessels are patent without proximal stenosis, aneurysm or vascular malformation. Both vertebral arteries are widely patent to the basilar. No basilar stenosis. Posterior circulation branch vessels appear normal. IMPRESSION: Negative intracranial MR angiography. Electronically Signed   By: Nelson Chimes M.D.   On: 11/19/2021 14:45  ? ?ECHOCARDIOGRAM COMPLETE ? ?Result Date: 11/19/2021 ?   ECHOCARDIOGRAM REPORT   Patient Name:   [name] Date of Exam: 11/19/2021 Medical Rec #:  [id number]      Height:       62.0 in Accession #:    [id number]     Weight:       145.0 lb Date of Birth:  10-24-1940      BSA:          1.667 m? Patient Age:    81 years       BP:           134/57 mmHg Patient Gender: F              HR:  52 bpm. Exam Location:  Inpatient Procedure: 2D Echo, Color Doppler and Cardiac Doppler Indications:    Stroke i63.9  History:        Patient has prior history of Echocardiogram examinations. CHF,                 Prior CABG, COPD; Risk Factors:Hypertension and Dyslipidemia.  Sonographer:    Raquel Sarna Senior RDCS Referring Phys: [place]  1. Left ventricular ejection fraction, by estimation, is 60 to 65%. The left ventricle has normal function. The left ventricle has no regional wall motion abnormalities. Left ventricular diastolic parameters are consistent with Grade I diastolic dysfunction (impaired relaxation).  2. Right ventricular systolic function is normal. The right ventricular size is normal.  3. Left atrial size was mildly dilated.  4. A small pericardial effusion is present. There is no evidence of cardiac tamponade.  5. No evidence of mitral valve regurgitation.  6. There is mild calcification of the aortic valve. Aortic valve regurgitation is not visualized.  7. The inferior vena cava is normal in size with greater than 50% respiratory variability, suggesting right atrial pressure of  3 mmHg. Comparison(s): No significant change from prior study. FINDINGS  Left Ventricle: Left ventricular ejection fraction, by estimation, is 60 to 65%. The left ventricle has normal function. The left ventricle has no regional wall motion abnormalities. The left ventricular internal cavity size was normal in size. There is  borderline left ventricular hypertrophy. Left ventricular diastolic parameters are consistent with Grade I diastolic dysfunction (impaired relaxation). Right Ventricle: The right ventricular size is normal. No increase in right ventricular wall thickness. Right ventricular systolic function is normal. Left Atrium: Left atrial size was mildly dilated. Right Atrium: Right atrial size was normal in size. Pericardium: A small pericardial effusion is present. There is no evidence of cardiac tamponade. Mitral Valve: No evidence of mitral valve regurgitation. Tricuspid Valve: Tricuspid valve regurgitation is not demonstrated. Aortic Valve: There is mild calcification of the aortic valve. Aortic valve regurgitation is not visualized. Aortic valve mean gradient measures 5.0 mmHg. Aortic valve peak gradient measures 9.6 mmHg. Aortic valve area, by VTI measures 1.93 cm?. Pulmonic Valve: Pulmonic valve regurgitation is not visualized. Aorta: The aortic root and ascending aorta are structurally normal, with no evidence of dilitation. Venous: The inferior vena cava is normal in size with greater than 50% respiratory variability, suggesting right atrial pressure of 3 mmHg.  LEFT VENTRICLE PLAX 2D LVIDd:         4.30 cm   Diastology LVIDs:         2.80 cm   LV e' medial:    4.70 cm/s LV PW:         1.10 cm   LV E/e' medial:  14.1 LV IVS:        0.90 cm   LV e' lateral:   7.51 cm/s LVOT diam:     1.90 cm   LV E/e' lateral: 8.8 LV SV:         63 LV SV Index:   38 LVOT Area:     2.84 cm?  RIGHT VENTRICLE RV S prime:     10.90 cm/s TAPSE (M-mode): 1.8 cm LEFT ATRIUM             Index        RIGHT ATRIUM            Index LA diam:        3.70 cm 2.22 cm/m?  RA Area:     17.40 cm? LA Vol (A2C):   54.5 ml 32.68 ml/m?  RA Volume:   45.00 ml  26.99 ml/m? LA Vol (A4C):   37.1 ml 22.25 ml/m? LA Biplane Vol: 46.7 ml 28.01 ml/m?  AORTIC VALVE AV Area (Vmax):    1.77 cm? AV Area (Vmean):   1.71 cm? AV Area (VTI):     1.93 cm? AV Vmax:           155.00 cm/s AV Vmean:          103.000 cm/s AV VTI:            0.325 m AV Peak Grad:      9.6 mmHg AV Mean Grad:      5.0 mmHg LVOT Vmax:         96.50 cm/s LVOT Vmean:        62.100 cm/s LVOT VTI:          0.221 m LVOT/AV VTI ratio: 0.68  AORTA Ao Root diam: 2.70 cm Ao Asc diam:  3.30 cm MITRAL VALVE MV Area (PHT): 1.83 cm?     SHUNTS MV Decel Time: 414 msec     Systemic VTI:  0.22 m MV E velocity: 66.30 cm/s   Systemic Diam: 1.90 cm MV A velocity: 116.00 cm/s MV E/A ratio:  0.57 Placido Sou signed by [name] Signature Date/Time: 11/19/2021/1:35:15 PM    Final   ? ?DG HIP UNILAT W OR W/O PELVIS 1V RIGHT ? ?Result Date: 11/19/2021 ?CLINICAL DATA:  Right hip pain EXAM: DG HIP (WITH OR WITHOUT PELVIS) 1V RIGHT COMPARISON:  None. FINDINGS: There is no evidence of hip fracture or dislocation. There is no evidence of arthropathy or other focal bone abnormality. IMPRESSION: Negative. Electronically Signed   By: Misty Stanley M.D.   On: 11/19/2021 10:47   ? ?PHYSICAL EXAM ? ?Physical Exam  ?Constitutional: Appears well-developed and well-nourished.  ?Cardiovascular: Normal rate and regular rhythm.  ?Respiratory: Effort normal, non-labored breathing ? ?Neuro: ?Mental Status: ?Patient is awake, alert, oriented to person, place, month, year, and situation. ?Patient is able to give a clear and coherent history. ?No signs of aphasia or neglect ?Cranial Nerves: ?II: Visual Fields are full. Pupils are equal, round, and reactive to light.   ?III,IV, VI: EOMI without ptosis or diploplia.  ?V: Facial sensation is symmetric to temperature ?VII: Facial movement is symmetric resting and  smiling ?VIII: Hearing is intact to voice ?X: Palate elevates symmetrically ?XI: Shoulder shrug is symmetric. ?XII: Tongue protrudes midline without atrophy or fasciculations.  ?Motor: ?Tone is normal. Bulk is no

## 2021-12-01 ENCOUNTER — Encounter (HOSPITAL_COMMUNITY): Payer: Self-pay

## 2021-12-01 ENCOUNTER — Ambulatory Visit (HOSPITAL_COMMUNITY): Admit: 2021-12-01 | Payer: Medicare Other | Admitting: Gastroenterology

## 2021-12-01 SURGERY — ESOPHAGOGASTRODUODENOSCOPY (EGD) WITH PROPOFOL
Anesthesia: Monitor Anesthesia Care

## 2021-12-02 ENCOUNTER — Other Ambulatory Visit: Payer: Self-pay

## 2021-12-02 ENCOUNTER — Inpatient Hospital Stay (HOSPITAL_COMMUNITY)
Admission: EM | Admit: 2021-12-02 | Discharge: 2021-12-05 | DRG: 378 | Disposition: A | Discharge status: Home / Self Care | Payer: Medicare Other | Attending: Family Medicine | Admitting: Family Medicine

## 2021-12-02 ENCOUNTER — Encounter (HOSPITAL_COMMUNITY): Payer: Self-pay

## 2021-12-02 DIAGNOSIS — Z9889 Other specified postprocedural states: Secondary | ICD-10-CM

## 2021-12-02 DIAGNOSIS — Z79899 Other long term (current) drug therapy: Secondary | ICD-10-CM

## 2021-12-02 DIAGNOSIS — E785 Hyperlipidemia, unspecified: Secondary | ICD-10-CM | POA: Diagnosis present

## 2021-12-02 DIAGNOSIS — Z66 Do not resuscitate: Secondary | ICD-10-CM | POA: Diagnosis present

## 2021-12-02 DIAGNOSIS — K921 Melena: Secondary | ICD-10-CM | POA: Diagnosis not present

## 2021-12-02 DIAGNOSIS — Z951 Presence of aortocoronary bypass graft: Secondary | ICD-10-CM

## 2021-12-02 DIAGNOSIS — Z9049 Acquired absence of other specified parts of digestive tract: Secondary | ICD-10-CM

## 2021-12-02 DIAGNOSIS — Z947 Corneal transplant status: Secondary | ICD-10-CM

## 2021-12-02 DIAGNOSIS — H409 Unspecified glaucoma: Secondary | ICD-10-CM | POA: Diagnosis present

## 2021-12-02 DIAGNOSIS — D500 Iron deficiency anemia secondary to blood loss (chronic): Secondary | ICD-10-CM | POA: Diagnosis not present

## 2021-12-02 DIAGNOSIS — Z87891 Personal history of nicotine dependence: Secondary | ICD-10-CM

## 2021-12-02 DIAGNOSIS — I252 Old myocardial infarction: Secondary | ICD-10-CM

## 2021-12-02 DIAGNOSIS — Z881 Allergy status to other antibiotic agents status: Secondary | ICD-10-CM

## 2021-12-02 DIAGNOSIS — K625 Hemorrhage of anus and rectum: Secondary | ICD-10-CM | POA: Diagnosis not present

## 2021-12-02 DIAGNOSIS — Z85118 Personal history of other malignant neoplasm of bronchus and lung: Secondary | ICD-10-CM

## 2021-12-02 DIAGNOSIS — I10 Essential (primary) hypertension: Secondary | ICD-10-CM | POA: Diagnosis present

## 2021-12-02 DIAGNOSIS — K922 Gastrointestinal hemorrhage, unspecified: Secondary | ICD-10-CM | POA: Diagnosis present

## 2021-12-02 DIAGNOSIS — Z8249 Family history of ischemic heart disease and other diseases of the circulatory system: Secondary | ICD-10-CM

## 2021-12-02 DIAGNOSIS — K219 Gastro-esophageal reflux disease without esophagitis: Secondary | ICD-10-CM | POA: Diagnosis present

## 2021-12-02 DIAGNOSIS — I2510 Atherosclerotic heart disease of native coronary artery without angina pectoris: Secondary | ICD-10-CM | POA: Diagnosis present

## 2021-12-02 DIAGNOSIS — J449 Chronic obstructive pulmonary disease, unspecified: Secondary | ICD-10-CM | POA: Diagnosis present

## 2021-12-02 DIAGNOSIS — K31811 Angiodysplasia of stomach and duodenum with bleeding: Secondary | ICD-10-CM | POA: Diagnosis present

## 2021-12-02 DIAGNOSIS — I130 Hypertensive heart and chronic kidney disease with heart failure and stage 1 through stage 4 chronic kidney disease, or unspecified chronic kidney disease: Secondary | ICD-10-CM | POA: Diagnosis present

## 2021-12-02 DIAGNOSIS — Z8673 Personal history of transient ischemic attack (TIA), and cerebral infarction without residual deficits: Secondary | ICD-10-CM

## 2021-12-02 DIAGNOSIS — Z7902 Long term (current) use of antithrombotics/antiplatelets: Secondary | ICD-10-CM

## 2021-12-02 DIAGNOSIS — I739 Peripheral vascular disease, unspecified: Secondary | ICD-10-CM | POA: Diagnosis present

## 2021-12-02 DIAGNOSIS — I5032 Chronic diastolic (congestive) heart failure: Secondary | ICD-10-CM | POA: Diagnosis present

## 2021-12-02 DIAGNOSIS — K5521 Angiodysplasia of colon with hemorrhage: Secondary | ICD-10-CM | POA: Diagnosis not present

## 2021-12-02 DIAGNOSIS — Z888 Allergy status to other drugs, medicaments and biological substances status: Secondary | ICD-10-CM

## 2021-12-02 DIAGNOSIS — N1832 Chronic kidney disease, stage 3b: Secondary | ICD-10-CM | POA: Diagnosis present

## 2021-12-02 DIAGNOSIS — Q0700 Arnold-Chiari syndrome without spina bifida or hydrocephalus: Secondary | ICD-10-CM

## 2021-12-02 DIAGNOSIS — Z885 Allergy status to narcotic agent status: Secondary | ICD-10-CM

## 2021-12-02 DIAGNOSIS — Z83438 Family history of other disorder of lipoprotein metabolism and other lipidemia: Secondary | ICD-10-CM

## 2021-12-02 LAB — CBC WITH DIFFERENTIAL/PLATELET
Abs Immature Granulocytes: 0.02 10*3/uL (ref 0.00–0.07)
Basophils Absolute: 0 10*3/uL (ref 0.0–0.1)
Basophils Relative: 0 %
Eosinophils Absolute: 0.3 10*3/uL (ref 0.0–0.5)
Eosinophils Relative: 4 %
HCT: 27.3 % — ABNORMAL LOW (ref 36.0–46.0)
Hemoglobin: 9.1 g/dL — ABNORMAL LOW (ref 12.0–15.0)
Immature Granulocytes: 0 %
Lymphocytes Relative: 23 %
Lymphs Abs: 1.6 10*3/uL (ref 0.7–4.0)
MCH: 30.3 pg (ref 26.0–34.0)
MCHC: 33.3 g/dL (ref 30.0–36.0)
MCV: 91 fL (ref 80.0–100.0)
Monocytes Absolute: 0.5 10*3/uL (ref 0.1–1.0)
Monocytes Relative: 8 %
Neutro Abs: 4.4 10*3/uL (ref 1.7–7.7)
Neutrophils Relative %: 65 %
Platelets: 274 10*3/uL (ref 150–400)
RBC: 3 MIL/uL — ABNORMAL LOW (ref 3.87–5.11)
RDW: 17.2 % — ABNORMAL HIGH (ref 11.5–15.5)
WBC: 6.8 10*3/uL (ref 4.0–10.5)
nRBC: 0 % (ref 0.0–0.2)

## 2021-12-02 LAB — COMPREHENSIVE METABOLIC PANEL
ALT: 12 U/L (ref 0–44)
AST: 18 U/L (ref 15–41)
Albumin: 3.7 g/dL (ref 3.5–5.0)
Alkaline Phosphatase: 70 U/L (ref 38–126)
Anion gap: 7 (ref 5–15)
BUN: 33 mg/dL — ABNORMAL HIGH (ref 8–23)
CO2: 26 mmol/L (ref 22–32)
Calcium: 9.4 mg/dL (ref 8.9–10.3)
Chloride: 106 mmol/L (ref 98–111)
Creatinine, Ser: 1.45 mg/dL — ABNORMAL HIGH (ref 0.44–1.00)
GFR, Estimated: 37 mL/min — ABNORMAL LOW (ref >60)
Glucose, Bld: 110 mg/dL — ABNORMAL HIGH (ref 70–99)
Potassium: 4.2 mmol/L (ref 3.5–5.1)
Sodium: 139 mmol/L (ref 135–145)
Total Bilirubin: 0.4 mg/dL (ref 0.3–1.2)
Total Protein: 7 g/dL (ref 6.5–8.1)

## 2021-12-02 LAB — HEMOGLOBIN AND HEMATOCRIT, BLOOD
HCT: 26.7 % — ABNORMAL LOW (ref 36.0–46.0)
Hemoglobin: 8.3 g/dL — ABNORMAL LOW (ref 12.0–15.0)

## 2021-12-02 LAB — POC OCCULT BLOOD, ED: Fecal Occult Bld: POSITIVE — AB

## 2021-12-02 LAB — PROTIME-INR
INR: 1 (ref 0.8–1.2)
Prothrombin Time: 13.3 seconds (ref 11.4–15.2)

## 2021-12-02 LAB — TYPE AND SCREEN
ABO/RH(D): A POS
Antibody Screen: NEGATIVE

## 2021-12-02 MED ORDER — HYDROXYZINE HCL 25 MG PO TABS
25.0000 mg | ORAL_TABLET | Freq: Every evening | ORAL | Status: DC | PRN
Start: 2021-12-02 — End: 2021-12-03

## 2021-12-02 MED ORDER — FUROSEMIDE 40 MG PO TABS
40.0000 mg | ORAL_TABLET | Freq: Every morning | ORAL | Status: DC
  Administered 2021-12-03 – 2021-12-04 (×2): 40 mg via ORAL
  Filled 2021-12-02 (×2): qty 1

## 2021-12-02 MED ORDER — IRBESARTAN 150 MG PO TABS
150.0000 mg | ORAL_TABLET | Freq: Every day | ORAL | Status: DC
  Administered 2021-12-03 – 2021-12-04 (×2): 150 mg via ORAL
  Filled 2021-12-02 (×2): qty 1

## 2021-12-02 MED ORDER — ALBUTEROL SULFATE (2.5 MG/3ML) 0.083% IN NEBU: 2.5000 mg | INHALATION_SOLUTION | Freq: Four times a day (QID) | RESPIRATORY_TRACT | Status: DC | PRN

## 2021-12-02 MED ORDER — ENOXAPARIN SODIUM 40 MG/0.4ML IJ SOSY: 40.0000 mg | PREFILLED_SYRINGE | INTRAMUSCULAR | Status: DC

## 2021-12-02 MED ORDER — ONDANSETRON HCL 4 MG/2ML IJ SOLN: 4.0000 mg | Freq: Four times a day (QID) | INTRAMUSCULAR | Status: DC | PRN

## 2021-12-02 MED ORDER — LORATADINE 10 MG PO TABS
10.0000 mg | ORAL_TABLET | Freq: Every day | ORAL | Status: DC
  Administered 2021-12-03 – 2021-12-04 (×2): 10 mg via ORAL
  Filled 2021-12-02 (×2): qty 1

## 2021-12-02 MED ORDER — PANTOPRAZOLE SODIUM 40 MG IV SOLR
40.0000 mg | Freq: Two times a day (BID) | INTRAVENOUS | Status: DC
  Administered 2021-12-02 – 2021-12-05 (×6): 40 mg via INTRAVENOUS
  Filled 2021-12-02 (×7): qty 10

## 2021-12-02 MED ORDER — ONDANSETRON HCL 4 MG PO TABS: 4.0000 mg | ORAL_TABLET | Freq: Four times a day (QID) | ORAL | Status: DC | PRN

## 2021-12-02 MED ORDER — LACTATED RINGERS IV SOLN: INTRAVENOUS | Status: DC

## 2021-12-02 MED ORDER — NITROGLYCERIN 0.4 MG SL SUBL: 0.4000 mg | SUBLINGUAL_TABLET | SUBLINGUAL | Status: DC | PRN

## 2021-12-02 MED ORDER — UMECLIDINIUM BROMIDE 62.5 MCG/ACT IN AEPB
1.0000 | INHALATION_SPRAY | Freq: Every day | RESPIRATORY_TRACT | Status: DC
  Administered 2021-12-03 – 2021-12-05 (×3): 1 via RESPIRATORY_TRACT
  Filled 2021-12-02: qty 7

## 2021-12-02 MED ORDER — ATORVASTATIN CALCIUM 40 MG PO TABS
40.0000 mg | ORAL_TABLET | Freq: Every day | ORAL | Status: DC
  Administered 2021-12-02 – 2021-12-04 (×3): 40 mg via ORAL
  Filled 2021-12-02 (×3): qty 1

## 2021-12-02 MED ORDER — FLUTICASONE FUROATE-VILANTEROL 200-25 MCG/ACT IN AEPB
1.0000 | INHALATION_SPRAY | Freq: Every day | RESPIRATORY_TRACT | Status: DC
  Administered 2021-12-03 – 2021-12-05 (×3): 1 via RESPIRATORY_TRACT
  Filled 2021-12-02: qty 28

## 2021-12-02 MED ORDER — PANTOPRAZOLE SODIUM 40 MG PO TBEC: 40.0000 mg | DELAYED_RELEASE_TABLET | Freq: Every day | ORAL | Status: DC

## 2021-12-02 NOTE — ED Notes (Signed)
Patient ambulated to the restroom with a steady gait.

## 2021-12-02 NOTE — Consult Note (Signed)
? ?Consultation ? ?Referring Provider:  ERMD Tomi Bamberger ?Primary Care Physician:  [name], MD ?Primary Gastroenterologist:  Dr.Hung ? ?Reason for Consultation:  melena ? ?HPI: [name] is a 81 y.o. female, known to Dr. Benson Norway, with history of recurrent GI bleeding in setting of chronic antiplatelet use.  She presented to the emergency room this morning after onset of grossly melenic stools, and had 4-5 episodes earlier this morning.  She says her stool has been dark over the past 2 to 3 days but today was different and extremely black and tarry.  She felt a bit weak at home.  Also feeling a little bit nauseated but has not vomited ?Patient has history of COPD, peripheral vascular disease status post iliac stents, she is status post previous left carotid endarterectomy and just had a right carotid endarterectomy in February 2023.  Also with history of squamous cell lung cancer, prior MI, chronic kidney disease, she status post appendectomy and cholecystectomy. ?She was just hospitalized in early April with a TIA This occurred in the setting of Plavix use.  She had Pletal 100 mg twice daily added to her regimen. ?At the time of discharge from that admission hemoglobin was 9.9 ? ?, Labs today show WBC 6.8/hemoglobin 9.1/hematocrit 27.3 ?BUN 33/creatinine 1.45 ?Pro time 13.3/INR 1.0 ? ?She has been hemodynamically stable. ?She did take her Plavix and Pletal today ? ?She had undergone work-up with EGD and colonoscopy in February 2023 during the time she was admitted for elective carotid endarterectomy.  She had a decline in hemoglobin about 3 g. ?EGD was normal ?Colonoscopy showed scattered diverticulosis, no AVMs ? ?Patient has had multiple endoscopic procedures over the past few years.  She had colonoscopy in May 2022 for melena and was noted to have a few cecal AVMs at that time treated with APC ?She had enteroscopy in November 2021 with 2 AVMs found in the gastric body treated with APC,, and colonoscopy at  that time with 2 AVMs treated with APC in the cecum.  Enteroscopy 2019 with a single AVM in the fourth portion of the duodenum treated with APC. ? ? ?Past Medical History:  ?Diagnosis Date  ? 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 Asthma   ? 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  ? Blood in stool   ? last week of aug 2018  ? Carotid artery occlusion   ? Chronic kidney disease   ? Complication of anesthesia   ? COPD (chronic obstructive pulmonary disease) ([place])   ? Coronary artery disease   ? 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  ? Diverticulitis   ? Dyspnea   ? with exertion  ? Gastroesophageal reflux disease   ? occ  ? Glaucoma   ? right eye  ? Headache   ? tension  ? Headache syndrome 11/27/2018  ? Hiatal hernia   ? Hyperlipidemia   ? Hypertension   ? 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 Lung cancer ([place]) dx 2018  ? 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 Myocardial infarction ([place]) 01/01/2000  ? Cardiac catheterization  ? Peripheral vascular disease ([place])   ? stents in legs x 2 or 3  ? Pneumonia   ? last time winter 2017 -2018  ? PONV (postoperative nausea and vomiting)   ? occassionally, last colonscopy did ok with anesthesia  ? Reflux   ? Wears dentures   ? Full set  ?  Wears glasses   ? ? ?Past Surgical History:  ?Procedure Laterality Date  ? ABDOMINAL HYSTERECTOMY    ? APPENDECTOMY    ? Arnold-chiari malformation repair  1998  ? Suboccipital craniectomy  ? CAROTID ENDARTERECTOMY  03/29/2010  ? Left  CEA  ? CHOLECYSTECTOMY    ? Gall Bladder  ? COLONOSCOPY WITH PROPOFOL N/A 04/22/2015  ? Procedure: COLONOSCOPY WITH PROPOFOL;  Surgeon: Carol Ada, MD;  Location: [facility];  Service: Endoscopy;  Laterality: N/A;  ? COLONOSCOPY WITH PROPOFOL N/A 05/25/2016  ? Procedure: COLONOSCOPY WITH PROPOFOL;  Surgeon: Carol Ada, MD;  Location: [facility];  Service: Endoscopy;  Laterality: N/A;  ? COLONOSCOPY WITH PROPOFOL N/A  05/03/2017  ? Procedure: COLONOSCOPY WITH PROPOFOL;  Surgeon: Carol Ada, MD;  [facility]: [facility];  Service: Endoscopy;  Laterality: N/A;  ? COLONOSCOPY WITH PROPOFOL N/A 06/29/2020  ? Procedure: COLONOSCOPY WITH PROPOFOL;  Surgeon: Carol Ada, MD;  [facility]: [facility];  Service: Endoscopy;  Laterality: N/A;  ? COLONOSCOPY WITH PROPOFOL N/A 01/05/2021  ? Procedure: COLONOSCOPY WITH PROPOFOL;  Surgeon: Carol Ada, MD;  [facility]: [facility];  Service: Endoscopy;  Laterality: N/A;  ? COLONOSCOPY WITH PROPOFOL N/A 09/28/2021  ? Procedure: COLONOSCOPY WITH PROPOFOL;  Surgeon: Carol Ada, MD;  Location: [facility];  Service: Endoscopy;  Laterality: N/A;  ? 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 CORONARY ARTERY BYPASS GRAFT  01/01/2000  ? x 3  ? ENDARTERECTOMY Right 09/26/2021  ? Procedure: RIGHT CAROTID ENDARTERECTOMY;  Surgeon: [name], MD;  Location: [facility] OR;  Service: Vascular;  Laterality: Right;  ? ENTEROSCOPY N/A 02/27/2018  ? Procedure: ENTEROSCOPY;  Surgeon: Carol Ada, MD;  [facility];  Service: Endoscopy;  Laterality: N/A;  ? ENTEROSCOPY N/A 07/18/2018  ? Procedure: ENTEROSCOPY;  Surgeon: Carol Ada, MD;  [facility];  Service: Endoscopy;  Laterality: N/A;  ? ENTEROSCOPY N/A 06/29/2020  ? Procedure: ENTEROSCOPY;  Surgeon: Carol Ada, MD;  [facility];  Service: Endoscopy;  Laterality: N/A;  ? ESOPHAGOGASTRODUODENOSCOPY N/A 05/25/2016  ? Procedure: ESOPHAGOGASTRODUODENOSCOPY (EGD);  Surgeon: Carol Ada, MD;  Location: [facility];  Service: Endoscopy;  Laterality: N/A;  ? ESOPHAGOGASTRODUODENOSCOPY N/A 08/01/2018  ? Procedure: ESOPHAGOGASTRODUODENOSCOPY (EGD);  Surgeon: Carol Ada, MD;  Location: [facility];  Service: Endoscopy;  Laterality: N/A;  ? ESOPHAGOGASTRODUODENOSCOPY (EGD) WITH PROPOFOL N/A 09/28/2021  ? Procedure: ESOPHAGOGASTRODUODENOSCOPY (EGD) WITH PROPOFOL;  Surgeon: Carol Ada, MD;  Location: [facility];  Service:  Endoscopy;  Laterality: N/A;  ? EYE SURGERY Right 1995 or 1996  ? Laser surgery for retinal hemorrhage  ? GIVENS CAPSULE STUDY N/A 07/16/2018  ? Procedure: GIVENS CAPSULE STUDY;  Surgeon: Carol Ada, MD;  [facility];  Service: Endoscopy;  Laterality: N/A;  ? HEMOSTASIS CLIP PLACEMENT  06/29/2020  ? Procedure: HEMOSTASIS CLIP PLACEMENT;  Surgeon: Carol Ada, MD;  [facility];  Service: Endoscopy;;  ? HEMOSTASIS CLIP PLACEMENT  01/05/2021  ? Procedure: HEMOSTASIS CLIP PLACEMENT;  Surgeon: Carol Ada, MD;  [facility];  Service: Endoscopy;;  ? HOT HEMOSTASIS N/A 02/27/2018  ? Procedure: HOT HEMOSTASIS (ARGON PLASMA COAGULATION/BICAP);  Surgeon: Carol Ada, MD;  Location: [facility];  Service: Endoscopy;  Laterality: N/A;  ? HOT HEMOSTASIS N/A 08/01/2018  ? Procedure: HOT HEMOSTASIS (ARGON PLASMA COAGULATION/BICAP);  Surgeon: Carol Ada, MD;  Location: [facility];  Service: Endoscopy;  Laterality: N/A;  ? HOT HEMOSTASIS N/A 06/29/2020  ? Procedure: HOT HEMOSTASIS (ARGON PLASMA COAGULATION/BICAP);  Surgeon: Carol Ada, MD;  [facility];  Service: Endoscopy;  Laterality: N/A;  ? HOT HEMOSTASIS N/A 01/05/2021  ? Procedure: HOT HEMOSTASIS (ARGON PLASMA COAGULATION/BICAP);  Surgeon: Carol Ada, MD;  Location: [facility];  Service: Endoscopy;  Laterality: N/A;  ? IR RADIOLOGIST EVAL & MGMT  12/14/2016  ? IR RADIOLOGIST EVAL & MGMT  07/26/2021  ? LEFT HEART CATH AND CORS/GRAFTS ANGIOGRAPHY N/A 01/09/2018  ? Procedure: LEFT HEART CATH AND CORS/GRAFTS ANGIOGRAPHY;  Surgeon: Charolette Forward, MD;  Location: [facility];  Service: Cardiovascular;  Laterality: N/A;  ? LEFT HEART CATHETERIZATION WITH CORONARY ANGIOGRAM N/A 08/03/2014  ? Procedure: LEFT HEART CATHETERIZATION WITH CORONARY ANGIOGRAM;  Surgeon: Birdie Riddle, MD;  Location: [facility];  Service: Cardiovascular;  Laterality: N/A;  ? PATCH ANGIOPLASTY Right 09/26/2021  ? Procedure: PATCH ANGIOPLASTY  RIGHT CAROTID;  Surgeon: [name], MD;  Location: [facility];  Service: Vascular;  Laterality: Right;  ? Post Coronary Artery  BPG  01/05/2000  ? Right jugular sheath removed  ? PR VEIN BYPASS GRAFT,AORTO-

## 2021-12-02 NOTE — ED Triage Notes (Addendum)
Pt presents with c/o rectal bleeding and diarrhea that started this morning. Pt reports stool is dark black in nature. Pt reports hx of rectal bleed and several surgeries for same. Pt recently started on cilostazol after being diagnosed with a TIA. ?

## 2021-12-02 NOTE — ED Provider Notes (Signed)
?[facility] DEPT ?Provider Note ? ? ?CSN: [id number] ?Arrival date & time: 12/02/21  1115 ? ?  ? ?History ? ?Chief Complaint  ?Patient presents with  ? Rectal Bleeding  ? ? ?[name] is a 81 y.o. female. ? ?81 y/o F with PMH of GI AVMs and diverticulosis requiring repeated transfusions and hematostatic clipping, TIA, lung cancer, CHF, and COPD presents for dark diarrhea. Pt states she had one episode last week, then has had 5 episodes this morning. Denies passage of BRBPR. Pt elicits associated fatigue, cold intolerance, lightheadedness, tunneling of vision upon standing, and RLQ abdominal pain. Denies fever, sweats, syncope, HA, hearing changes, trouble speaking, numbling/tingling/weakness, SHOB, chest pain, palpitations, urinary changes. Pt states this presentation is similar to prior episodes requiring transfusion and clipping. She is typically managed by [name]. Of note, she had a carotid endarterectomy on 09/26/21 and was hospitalized on 11/29/21 for a TIA. She was started on Cilostazol upopn discharge. Additionally, patient takes Plavix but is not taking any anticoagulants. Currently taking PPI. Her last colonoscopy and EGD on 09/28/21 showed many diverticula with no obvious source of bleeding and the EGD revealed no pathology of the duodenum, stomach, or esophagus.  ? ? ?  ? ?Home Medications ?Prior to Admission medications   ?Medication Sig Start Date End Date Taking? Authorizing Provider  ?acetaminophen (TYLENOL) 500 MG tablet Take 500-1,000 mg by mouth every 6 (six) hours as needed (for pain).    [provider]  ?albuterol (VENTOLIN HFA) 108 (90 Base) MCG/ACT inhaler Inhale 2 puffs into the lungs every 6 (six) hours as needed for wheezing or shortness of breath. 05/25/20   Garner Nash, DO  ?atorvastatin (LIPITOR) 40 MG tablet Take 1 tablet (40 mg total) by mouth at bedtime. 09/29/21   Rhyne, Hulen Shouts, PA-C  ?cetirizine (ZYRTEC) 10 MG tablet Take 10  mg by mouth daily as needed for allergies.     [provider]  ?cilostazol (PLETAL) 100 MG tablet Take 1 tablet (100 mg total) by mouth 2 (two) times daily. 11/20/21 12/20/21  Pokhrel, Corrie Mckusick, MD  ?clopidogrel (PLAVIX) 75 MG tablet Take 1 tablet (75 mg total) by mouth daily. ?Patient taking differently: Take 75 mg by mouth in the morning. 07/08/20   Mercy Riding, MD  ?dexlansoprazole (DEXILANT) 60 MG capsule Take 1 capsule (60 mg total) by mouth daily before breakfast. 11/17/12   Tanda Rockers, MD  ?Fluticasone-Umeclidin-Vilant (TRELEGY ELLIPTA) 200-62.5-25 MCG/ACT AEPB Inhale 1 puff into the lungs daily. 06/15/21   Garner Nash, DO  ?furosemide (LASIX) 40 MG tablet Take 40 mg by mouth daily. 01/30/21   [provider]  ?Magnesium Oxide 400 MG CAPS Take 400 mg by mouth in the morning.    [provider]  ?nitroGLYCERIN (NITRODUR - DOSED IN MG/24 HR) 0.2 mg/hr patch Place 0.2 mg onto the skin in the morning.    [provider]  ?nitroGLYCERIN (NITROSTAT) 0.4 MG SL tablet Place 0.4 mg under the tongue every 5 (five) minutes x 3 doses as needed for chest pain.    [provider]  ?olmesartan (BENICAR) 20 MG tablet Take 1 tablet (20 mg total) by mouth daily. ?Patient taking differently: Take 10 mg by mouth in the morning. 07/04/20   Mercy Riding, MD  ?ondansetron (ZOFRAN-ODT) 4 MG disintegrating tablet Take 4 mg by mouth every 8 (eight) hours as needed for nausea or vomiting (dissolve in the mouth). 03/29/21   [provider]  ?   ? ?  Allergies    ?Avelox [moxifloxacin hcl in nacl], Azithromycin, Codeine, Doxycycline, Hydromorphone, Levaquin [levofloxacin], Vitamin d analogs, Nifedipine er, Oxycodone-acetaminophen, and Risedronate   ? ?Review of Systems   ?Review of Systems ?Negative except as per HPI ?Physical Exam ?Updated Vital Signs ?BP (!) 136/94   Pulse 64   Temp 97.6 ?F (36.4 ?C) (Oral)   Resp 16   SpO2 100%  ?Physical Exam ?Vitals and nursing note  reviewed. Exam conducted with a chaperone present.  ?Constitutional:   ?   General: She is not in acute distress. ?   Appearance: She is well-developed. She is not diaphoretic.  ?HENT:  ?   Head: Normocephalic and atraumatic.  ?   Mouth/Throat:  ?   Mouth: Mucous membranes are moist.  ?Eyes:  ?   Conjunctiva/sclera: Conjunctivae normal.  ?Cardiovascular:  ?   Rate and Rhythm: Normal rate and regular rhythm.  ?   Pulses: Normal pulses.  ?   Heart sounds: Normal heart sounds.  ?Pulmonary:  ?   Effort: Pulmonary effort is normal.  ?   Breath sounds: Normal breath sounds.  ?Abdominal:  ?   Palpations: Abdomen is soft.  ?   Tenderness: There is abdominal tenderness in the right upper quadrant and right lower quadrant. There is no right CVA tenderness or left CVA tenderness.  ?   Comments: Mild right-sided abdominal pain which patient states is chronic, unchanged, would not have prompted her visit today  ?Genitourinary: ?   Rectum: Guaiac result positive.  ?Musculoskeletal:  ?   Right lower leg: No edema.  ?   Left lower leg: No edema.  ?Skin: ?   General: Skin is warm and dry.  ?   Findings: No erythema or rash.  ?Neurological:  ?   Mental Status: She is alert and oriented to person, place, and time.  ?Psychiatric:     ?   Behavior: Behavior normal.  ? ? ?ED Results / Procedures / Treatments   ?Labs ?(all labs ordered are listed, but only abnormal results are displayed) ?Labs Reviewed  ?CBC WITH DIFFERENTIAL/PLATELET - Abnormal; Notable for the following components:  ?    Result Value  ? RBC 3.00 (*)   ? Hemoglobin 9.1 (*)   ? HCT 27.3 (*)   ? RDW 17.2 (*)   ? All other components within normal limits  ?COMPREHENSIVE METABOLIC PANEL - Abnormal; Notable for the following components:  ? Glucose, Bld 110 (*)   ? BUN 33 (*)   ? Creatinine, Ser 1.45 (*)   ? GFR, Estimated 37 (*)   ? All other components within normal limits  ?POC OCCULT BLOOD, ED - Abnormal; Notable for the following components:  ? Fecal Occult Bld POSITIVE  (*)   ? All other components within normal limits  ?PROTIME-INR  ?HEMOGLOBIN AND HEMATOCRIT, BLOOD  ?HEMOGLOBIN AND HEMATOCRIT, BLOOD  ?TYPE AND SCREEN  ? ? ?EKG ?None ? ?Radiology ?No results found. ? ?Procedures ?Procedures  ? ? ?Medications Ordered in ED ?Medications  ?lactated ringers infusion (has no administration in time range)  ? ? ?ED Course/ Medical Decision Making/ A&P ?  ?                        ?Medical Decision Making ?Amount and/or Complexity of Data Reviewed ?Labs: ordered. ? ? ?This patient presents to the ED for concern of Gi bleed, history of same, black/loose stools starting last night, this involves an extensive number of treatment  options, and is a complaint that carries with it a high risk of complications and morbidity.  The differential diagnosis includes GI bleed, worsening anemia ? ? ?Co morbidities that complicate the patient evaluation ? ?TIA, CAD, hyperlipidemia, HTN, diverticulitis, CKD, CHF ? ? ?Additional history obtained: ? ?Additional history obtained from daughter at bedside ?External records from outside source obtained and reviewed including 09/27/21 consult note during admission, seen by Dr Benson Norway for hgb drop from 9.7 to 6.6, endoscopy/colonoscopy at that time negative for bleeding (scopes scheduled for 12/01/21 were canceled at that time as pt was scoped during admission). ?Prior Hgb reviewed, generally 9s, 12.8 on 10/23/21, 9.9 on 11/18/21 ? ? ?Lab Tests: ? ?I Ordered, and personally interpreted labs.  The pertinent results include:  CBC with hg 9.1 (previously 9.9 on 11/18/21), CMP with Cr and BUN around baseline, hemoccult positive. INR normal. ? ? ? ?Consultations Obtained: ? ?I requested consultation with the [facility] team- Nicoletta Ba, PA-C, on call for Dr. Benson Norway,  and discussed lab and imaging findings as well as pertinent plan - they recommend: hospitalist admit, GI will see. ?Discussed with Dr. Olevia Bowens with [facility] who will consult for admission.   ? ? ?Problem List / ED Course / Critical interventions / Medication management ? ?81 yo female with history of gi bleed (numerous, history of AVMs), on Plavix, Pletal added earlier this month after TIA admission. Noted d

## 2021-12-02 NOTE — H&P (Signed)
?History and Physical  ? ? ?Patient: [name]:[id number] DOB: 1940-12-01 ?DOA: 12/02/2021 ?DOS: the patient was seen and examined on 12/02/2021 ?PCP: [name], MD  ?Patient coming from: Home ? ?Chief Complaint:  ?Chief Complaint  ?Patient presents with  ? Rectal Bleeding  ? ?HPI: [name] is a 81 y.o. female with medical history significant of common iliac artery aneurysm, aortoiliac occlusive disease, Arnold-Chiari malformation, asthma/COPD, history of community-acquired pneumonia, bilateral occipital neuralgia, carotid artery stenosis, stage 3b CKD, history of MI, CAD/CABG x3, diverticulosis/diverticulitis episodes, GERD/hiatal hernia, right eye glaucoma, herpes zoster, hyperlipidemia, hypertension, lung cancer, peripheral vascular disease with lower extremity stent placement who was recently admitted and discharged from 11/18/2021 until 11/20/2021 due to transient right-sided numbness in the setting of TIA with cilostazol 100 mg p.o. twice daily added to clopidogrel 75 mg p.o. daily who is coming to the emergency department complaints of rectal bleeding and melanotic diarrhea since yesterday.  She has been fatigued and lightheaded with postural changes.  She has mild RLQ pain with mild nausea, but no emesis.  Denied chest pain, palpitations, diaphoresis, worsening dyspnea, PND, orthopnea or recent pitting edema of the lower extremities.  No flank pain, dysuria, frequency or hematuria.  No polyuria, polydipsia, polyphagia or blurred vision. ? ?ED course: Initial vital signs were temperature 97.6 ?F, pulse 95, respiration 18, BP 150/89 mmHg O2 sat 100% on room air. ? ?Lab work: Fecal occult blood was positive.  CBC shows white count 6.9, hemoglobin 9.1 g/dL platelets 274.  Last hemoglobin A1c level was 9.9 g/dL earlier this month.  PT was 13.3 and INR 1.0.  CMP shows a glucose of 110, BUN 33 and creatinine 1.45 mg/dL with a GFR 37 mL/min.  Electrolytes and LFTs were normal. ? ?Review of Systems: As  mentioned in the history of present illness. All other systems reviewed and are negative. ?Past Medical History:  ?Diagnosis Date  ? Aneurysm of common iliac artery ([place]) 04/2008  ? 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[place])   ? Arnold-Chiari malformation ([place]) 1998  ? Asthma   ? 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  ? Blood in stool   ? last week of aug 2018  ? CAP (community acquired pneumonia) 10/22/2015  ? Carotid artery stenosis 12/09/2013  ? Chronic kidney disease   ? Complication of anesthesia   ? COPD (chronic obstructive pulmonary disease) ([place])   ? Coronary artery disease   ? 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  ? Diverticulitis   ? Dyspnea   ? with exertion  ? Gastroesophageal reflux disease   ? occ  ? Glaucoma   ? right eye  ? Headache   ? tension  ? Headache syndrome 11/27/2018  ? Hiatal hernia   ? History of shingles 06/23/2018  ? Hyperlipidemia   ? Hypertension   ? Iliac artery aneurysm ([place])   ? Lung cancer ([place]) dx 2018  ? 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 Myocardial infarction ([place]) 01/01/2000  ? Cardiac catheterization  ? Peripheral vascular disease ([place])   ? stents in legs x 2 or 3  ? Pneumonia   ? last time winter 2017 -2018  ? PONV (postoperative nausea and vomiting)   ? occassionally, last colonscopy did ok with anesthesia  ? Primary cancer of right lower lobe of lung ([place]) 04/25/2016  ? Reflux   ? Right-sided carotid artery disease ([place]) 01/07/2013  ? TIA (transient ischemic attack) 02/11/2019  ? Wears dentures   ? Full set  ? Wears  glasses   ? ?Past Surgical History:  ?Procedure Laterality Date  ? ABDOMINAL HYSTERECTOMY    ? APPENDECTOMY    ? Arnold-chiari malformation repair  1998  ? Suboccipital craniectomy  ? CAROTID ENDARTERECTOMY  03/29/2010  ? Left  CEA  ? CHOLECYSTECTOMY    ? Gall Bladder  ? COLONOSCOPY WITH PROPOFOL N/A 04/22/2015  ? Procedure: COLONOSCOPY WITH PROPOFOL;  Surgeon: Carol Ada, MD;  Location: [facility];  Service: Endoscopy;  Laterality: N/A;  ? COLONOSCOPY  WITH PROPOFOL N/A 05/25/2016  ? Procedure: COLONOSCOPY WITH PROPOFOL;  Surgeon: Carol Ada, MD;  Location: [facility];  Service: Endoscopy;  Laterality: N/A;  ? COLONOSCOPY WITH PROPOFOL N/A 05/03/2017  ? Procedure: COLONOSCOPY WITH PROPOFOL;  Surgeon: Carol Ada, MD;  [facility]: [facility];  Service: Endoscopy;  Laterality: N/A;  ? COLONOSCOPY WITH PROPOFOL N/A 06/29/2020  ? Procedure: COLONOSCOPY WITH PROPOFOL;  Surgeon: Carol Ada, MD;  [facility]: [facility];  Service: Endoscopy;  Laterality: N/A;  ? COLONOSCOPY WITH PROPOFOL N/A 01/05/2021  ? Procedure: COLONOSCOPY WITH PROPOFOL;  Surgeon: Carol Ada, MD;  [facility]: [facility];  Service: Endoscopy;  Laterality: N/A;  ? COLONOSCOPY WITH PROPOFOL N/A 09/28/2021  ? Procedure: COLONOSCOPY WITH PROPOFOL;  Surgeon: Carol Ada, MD;  Location: [facility];  Service: Endoscopy;  Laterality: N/A;  ? 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 CORONARY ARTERY BYPASS GRAFT  01/01/2000  ? x 3  ? ENDARTERECTOMY Right 09/26/2021  ? Procedure: RIGHT CAROTID ENDARTERECTOMY;  Surgeon: [name], MD;  Location[facility];  Service: Vascular;  Laterality: Right;  ? ENTEROSCOPY N/A 02/27/2018  ? Procedure: ENTEROSCOPY;  Surgeon: Carol Ada, MD;  [facility];  Service: Endoscopy;  Laterality: N/A;  ? ENTEROSCOPY N/A 07/18/2018  ? Procedure: ENTEROSCOPY;  Surgeon: Carol Ada, MD;  [facility];  Service: Endoscopy;  Laterality: N/A;  ? ENTEROSCOPY N/A 06/29/2020  ? Procedure: ENTEROSCOPY;  Surgeon: Carol Ada, MD;  [facility];  Service: Endoscopy;  Laterality: N/A;  ? ESOPHAGOGASTRODUODENOSCOPY N/A 05/25/2016  ? Procedure: ESOPHAGOGASTRODUODENOSCOPY (EGD);  Surgeon: Carol Ada, MD;  Location: [facility];  Service: Endoscopy;  Laterality: N/A;  ? ESOPHAGOGASTRODUODENOSCOPY N/A 08/01/2018  ? Procedure: ESOPHAGOGASTRODUODENOSCOPY (EGD);  Surgeon: Carol Ada, MD;  Location: [facility];  Service: Endoscopy;  Laterality: N/A;  ?  ESOPHAGOGASTRODUODENOSCOPY (EGD) WITH PROPOFOL N/A 09/28/2021  ? Procedure: ESOPHAGOGASTRODUODENOSCOPY (EGD) WITH PROPOFOL;  Surgeon: Carol Ada, MD;  Location: [facility];  Service: Endoscopy;  Laterality: N/A;  ? EYE SURGERY Right 1995 or 1996  ? Laser surgery for retinal hemorrhage  ? GIVENS CAPSULE STUDY N/A 07/16/2018  ? Procedure: GIVENS CAPSULE STUDY;  Surgeon: Carol Ada, MD;  [facility];  Service: Endoscopy;  Laterality: N/A;  ? HEMOSTASIS CLIP PLACEMENT  06/29/2020  ? Procedure: HEMOSTASIS CLIP PLACEMENT;  Surgeon: Carol Ada, MD;  [facility]: [facility];  Service: Endoscopy;;  ? HEMOSTASIS CLIP PLACEMENT  01/05/2021  ? Procedure: HEMOSTASIS CLIP PLACEMENT;  Surgeon: Carol Ada, MD;  [facility]: [facility];  Service: Endoscopy;;  ? HOT HEMOSTASIS N/A 02/27/2018  ? Procedure: HOT HEMOSTASIS (ARGON PLASMA COAGULATION/BICAP);  Surgeon: Carol Ada, MD;  Location: [facility];  Service: Endoscopy;  Laterality: N/A;  ? HOT HEMOSTASIS N/A 08/01/2018  ? Procedure: HOT HEMOSTASIS (ARGON PLASMA COAGULATION/BICAP);  Surgeon: Carol Ada, MD;  Location: [facility];  Service: Endoscopy;  Laterality: N/A;  ? HOT HEMOSTASIS N/A 06/29/2020  ? Procedure: HOT HEMOSTASIS (ARGON PLASMA COAGULATION/BICAP);  Surgeon: Carol Ada, MD;  Location: [facility];  Service: Endoscopy;  Laterality: N/A;  ? HOT HEMOSTASIS N/A 01/05/2021  ? Procedure: HOT HEMOSTASIS (ARGON PLASMA COAGULATION/BICAP);  Surgeon: Carol Ada, MD;  Location: [facility];  Service: Endoscopy;  Laterality: N/A;  ? IR RADIOLOGIST EVAL & MGMT  12/14/2016  ? IR RADIOLOGIST EVAL & MGMT  07/26/2021  ? LEFT HEART CATH AND CORS/GRAFTS ANGIOGRAPHY N/A 01/09/2018  ? Procedure: LEFT HEART CATH AND CORS/GRAFTS ANGIOGRAPHY;  Surgeon: Charolette Forward, MD;  Location: [facility];  Service: Cardiovascular;  Laterality: N/A;  ? LEFT HEART CATHETERIZATION WITH CORONARY ANGIOGRAM N/A 08/03/2014  ? Procedure: LEFT HEART CATHETERIZATION WITH  CORONARY ANGIOGRAM;  Surgeon: Birdie Riddle, MD;  Location: [facility];  Service: Cardiovascular;  Laterality: N/A;  ? PATCH ANGIOPLASTY Right 09/26/2021  ? Procedure: PATCH ANGIOPLASTY RIGHT CAROTID;

## 2021-12-03 DIAGNOSIS — Z8673 Personal history of transient ischemic attack (TIA), and cerebral infarction without residual deficits: Secondary | ICD-10-CM

## 2021-12-03 DIAGNOSIS — Z885 Allergy status to narcotic agent status: Secondary | ICD-10-CM | POA: Diagnosis not present

## 2021-12-03 DIAGNOSIS — Z79899 Other long term (current) drug therapy: Secondary | ICD-10-CM | POA: Diagnosis not present

## 2021-12-03 DIAGNOSIS — Z66 Do not resuscitate: Secondary | ICD-10-CM | POA: Diagnosis present

## 2021-12-03 DIAGNOSIS — I252 Old myocardial infarction: Secondary | ICD-10-CM | POA: Diagnosis not present

## 2021-12-03 DIAGNOSIS — E785 Hyperlipidemia, unspecified: Secondary | ICD-10-CM | POA: Diagnosis present

## 2021-12-03 DIAGNOSIS — Z951 Presence of aortocoronary bypass graft: Secondary | ICD-10-CM | POA: Diagnosis not present

## 2021-12-03 DIAGNOSIS — I110 Hypertensive heart disease with heart failure: Secondary | ICD-10-CM | POA: Diagnosis not present

## 2021-12-03 DIAGNOSIS — I130 Hypertensive heart and chronic kidney disease with heart failure and stage 1 through stage 4 chronic kidney disease, or unspecified chronic kidney disease: Secondary | ICD-10-CM | POA: Diagnosis present

## 2021-12-03 DIAGNOSIS — I2510 Atherosclerotic heart disease of native coronary artery without angina pectoris: Secondary | ICD-10-CM | POA: Diagnosis present

## 2021-12-03 DIAGNOSIS — D500 Iron deficiency anemia secondary to blood loss (chronic): Secondary | ICD-10-CM | POA: Diagnosis present

## 2021-12-03 DIAGNOSIS — I5032 Chronic diastolic (congestive) heart failure: Secondary | ICD-10-CM | POA: Diagnosis present

## 2021-12-03 DIAGNOSIS — Z888 Allergy status to other drugs, medicaments and biological substances status: Secondary | ICD-10-CM | POA: Diagnosis not present

## 2021-12-03 DIAGNOSIS — Z881 Allergy status to other antibiotic agents status: Secondary | ICD-10-CM | POA: Diagnosis not present

## 2021-12-03 DIAGNOSIS — Z9049 Acquired absence of other specified parts of digestive tract: Secondary | ICD-10-CM | POA: Diagnosis not present

## 2021-12-03 DIAGNOSIS — Z83438 Family history of other disorder of lipoprotein metabolism and other lipidemia: Secondary | ICD-10-CM | POA: Diagnosis not present

## 2021-12-03 DIAGNOSIS — K5520 Angiodysplasia of colon without hemorrhage: Secondary | ICD-10-CM | POA: Diagnosis not present

## 2021-12-03 DIAGNOSIS — Z85118 Personal history of other malignant neoplasm of bronchus and lung: Secondary | ICD-10-CM | POA: Diagnosis not present

## 2021-12-03 DIAGNOSIS — Q0700 Arnold-Chiari syndrome without spina bifida or hydrocephalus: Secondary | ICD-10-CM | POA: Diagnosis not present

## 2021-12-03 DIAGNOSIS — N1832 Chronic kidney disease, stage 3b: Secondary | ICD-10-CM | POA: Diagnosis present

## 2021-12-03 DIAGNOSIS — H409 Unspecified glaucoma: Secondary | ICD-10-CM | POA: Diagnosis present

## 2021-12-03 DIAGNOSIS — K31819 Angiodysplasia of stomach and duodenum without bleeding: Secondary | ICD-10-CM | POA: Diagnosis not present

## 2021-12-03 DIAGNOSIS — J449 Chronic obstructive pulmonary disease, unspecified: Secondary | ICD-10-CM | POA: Diagnosis present

## 2021-12-03 DIAGNOSIS — K31811 Angiodysplasia of stomach and duodenum with bleeding: Secondary | ICD-10-CM | POA: Diagnosis present

## 2021-12-03 DIAGNOSIS — K625 Hemorrhage of anus and rectum: Secondary | ICD-10-CM | POA: Diagnosis present

## 2021-12-03 DIAGNOSIS — K921 Melena: Secondary | ICD-10-CM | POA: Diagnosis not present

## 2021-12-03 DIAGNOSIS — Z9889 Other specified postprocedural states: Secondary | ICD-10-CM

## 2021-12-03 DIAGNOSIS — K219 Gastro-esophageal reflux disease without esophagitis: Secondary | ICD-10-CM | POA: Diagnosis present

## 2021-12-03 DIAGNOSIS — Z7902 Long term (current) use of antithrombotics/antiplatelets: Secondary | ICD-10-CM | POA: Diagnosis not present

## 2021-12-03 DIAGNOSIS — K5521 Angiodysplasia of colon with hemorrhage: Secondary | ICD-10-CM | POA: Diagnosis present

## 2021-12-03 LAB — BASIC METABOLIC PANEL
Anion gap: 6 (ref 5–15)
BUN: 30 mg/dL — ABNORMAL HIGH (ref 8–23)
CO2: 27 mmol/L (ref 22–32)
Calcium: 9.3 mg/dL (ref 8.9–10.3)
Chloride: 107 mmol/L (ref 98–111)
Creatinine, Ser: 1.22 mg/dL — ABNORMAL HIGH (ref 0.44–1.00)
GFR, Estimated: 45 mL/min — ABNORMAL LOW (ref >60)
Glucose, Bld: 101 mg/dL — ABNORMAL HIGH (ref 70–99)
Potassium: 4 mmol/L (ref 3.5–5.1)
Sodium: 140 mmol/L (ref 135–145)

## 2021-12-03 LAB — HEMOGLOBIN AND HEMATOCRIT, BLOOD
HCT: 24 % — ABNORMAL LOW (ref 36.0–46.0)
HCT: 24.7 % — ABNORMAL LOW (ref 36.0–46.0)
HCT: 26.9 % — ABNORMAL LOW (ref 36.0–46.0)
Hemoglobin: 7.9 g/dL — ABNORMAL LOW (ref 12.0–15.0)
Hemoglobin: 8 g/dL — ABNORMAL LOW (ref 12.0–15.0)
Hemoglobin: 8.8 g/dL — ABNORMAL LOW (ref 12.0–15.0)

## 2021-12-03 MED ORDER — DIPHENHYDRAMINE HCL 25 MG PO CAPS
25.0000 mg | ORAL_CAPSULE | Freq: Every evening | ORAL | Status: DC | PRN
  Filled 2021-12-03: qty 1

## 2021-12-03 MED ORDER — ACETAMINOPHEN 325 MG PO TABS
650.0000 mg | ORAL_TABLET | Freq: Every evening | ORAL | Status: DC | PRN
  Administered 2021-12-03: 650 mg via ORAL
  Filled 2021-12-03: qty 2

## 2021-12-03 MED ORDER — ACETAMINOPHEN 325 MG PO TABS
650.0000 mg | ORAL_TABLET | Freq: Four times a day (QID) | ORAL | Status: DC | PRN
  Administered 2021-12-03 – 2021-12-05 (×3): 650 mg via ORAL
  Filled 2021-12-03 (×3): qty 2

## 2021-12-03 NOTE — Progress Notes (Signed)
?PROGRESS NOTE ? ? ? ?[name]  NLG:[id number] DOB: 14-Feb-1941 DOA: 12/02/2021 ?PCP: [name], MD  ?Chief Complaint  ?Patient presents with  ? Rectal Bleeding  ? ? ?Brief Narrative:  ?81 y.o. female with medical history significant of common iliac artery aneurysm, aortoiliac occlusive disease, Arnold-Chiari malformation, asthma/COPD, history of community-acquired pneumonia, bilateral occipital neuralgia, carotid artery stenosis, stage 3b CKD, history of MI, CAD/CABG x3, diverticulosis/diverticulitis episodes, GERD/hiatal hernia, right eye glaucoma, herpes zoster, hyperlipidemia, hypertension, lung cancer, peripheral vascular disease with lower extremity stent placement who was recently admitted and discharged from 11/18/2021 until 11/20/2021 due to transient right-sided numbness in the setting of TIA with cilostazol 100 mg p.o. twice daily added to clopidogrel 75 mg p.o. daily who is coming to the emergency department complaints of rectal bleeding and melanotic diarrhea since yesterday.  She's been admitted with GI bleed.  GI has been consulted. ? ?See below for additional details  ? ? ?Assessment & Plan: ?  ?Principal Problem: ?  Melena ?Active Problems: ?  Rectal bleeding ?  Iron deficiency anemia due to chronic blood loss ?  Chronic diastolic CHF (congestive heart failure) ([place]) ?  COPD GOLD II ?  History of transient ischemic attack (TIA) ?  S/P carotid endarterectomy ?  Peripheral vascular disease ([place]) ?  CAD (coronary artery disease) ?  HLD (hyperlipidemia) ?  Essential hypertension ?  GERD (gastroesophageal reflux disease) ? ? 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  ? If candidate for long acting octreotide given freq of  bleeding. ?Recommending CTA with possible embolization if significant/overt GI bleeding in interim ?1 episode of melena this AM, will follow closely ?Will continue to hold plavix/pletal ? ?Iron deficiency anemia due to chronic blood loss ?Hb 9.9 4/8 ?downtrending to 8 on 4/23 ?Will continue to trend ? ? ?Chronic diastolic CHF (congestive heart failure) ([place]) ?Echo with EF 08-14%, grade I diastolic dysfunction ?Appears euvolemic at this time ?Continue irbesartan, lasix  ? ?S/P carotid endarterectomy ?Hx L CEA in 2011 ?S/p right carotid enarterectomy with bovine pericardial patch angioplasty on 09/26/2021 ?Plavix/pletal on hold ?Continue statin ? ?History of transient ischemic attack (TIA) ?Seen on 11/20/2021 with concern for TIA ?At that time, seen by Dr. Leonie Man, notes presented with R hemispheric TIA due to small vessel disease, recommended adding pletal to daily plavix.  ?Currently plavix/pletal on hold with above ? ?COPD GOLD II ?Continue breo ellipta, incruse ellipta ?Albuterol prn ? ?Peripheral vascular disease ([place]) ?Holding pletal and plavix due to GI bleeding above ?lipitor ? ?CAD (coronary artery disease) ?lipitor ?Holding pletal, plavix ? ?HLD (hyperlipidemia) ?lipitor ? ?Essential hypertension ?Irbesartan, lasix ? ?GERD (gastroesophageal reflux disease) ?PPI BID ? ? ?DVT prophylaxis: SCD ?Code Status: DNR ?Family Communication: none ?Disposition:  ? ?Status is: Observation ?The patient will require care spanning > 2 midnights and should be moved to inpatient because: need for GI eva, GI bleeding ?  ?Consultants:  ?GI ? ?Procedures:  ?none ? ?Antimicrobials:  ?Anti-infectives (From admission, onward)  ? ? None  ? ?  ? ? ?Subjective: ?Some abdominal fullness, but no pain ?Some black stool this am ? ?Objective: ?Vitals:  ? 12/02/21 2056 12/03/21 0041 12/03/21 0431 12/03/21 1436  ?BP: (!) 146/43 (!) 118/46 (!) 137/48 (!) 124/49  ?Pulse: 60 61 (!) 58 (!) 52  ?  Resp: 20 16 20 16   ?Temp: 98.2 ?F (36.8 ?C) 97.8 ?F  (36.6 ?C) (!) 97.5 ?F (36.4 ?C) 98.2 ?F (36.8 ?C)  ?TempSrc: Oral Oral Oral Oral  ?SpO2: 97% 96% 95% 99%  ?Height:      ? ? ?Intake/Output Summary (Last 24 hours) at 12/03/2021 1647 ?Last data filed at 12/03/2021 1300 ?Gross per 24 hour  ?Intake 1409.3 ml  ?Output 600 ml  ?Net 809.3 ml  ? ?There were no vitals filed for this visit. ? ?Examination: ? ?General exam: Appears calm and comfortable  ?Respiratory system: unlabored ?Cardiovascular system: RRR ?Gastrointestinal system: Abdomen is nondistended, soft and nontender.  ?Central nervous system: Alert and oriented. No focal neurological deficits. ?Extremities: no LEE ? ? ? ?Data Reviewed: I have personally reviewed following labs and imaging studies ? ?CBC: ?Recent Labs  ?Lab 12/02/21 ?1245 12/02/21 ?1840 12/03/21 ?0136 12/03/21 ?1749  ?WBC 6.8  --   --   --   ?NEUTROABS 4.4  --   --   --   ?HGB 9.1* 8.3* 7.9* 8.0*  ?HCT 27.3* 26.7* 24.0* 24.7*  ?MCV 91.0  --   --   --   ?PLT 274  --   --   --   ? ? ?Basic Metabolic Panel: ?Recent Labs  ?Lab 12/02/21 ?1245 12/03/21 ?4496  ?NA 139 140  ?K 4.2 4.0  ?CL 106 107  ?CO2 26 27  ?GLUCOSE 110* 101*  ?BUN 33* 30*  ?CREATININE 1.45* 1.22*  ?CALCIUM 9.4 9.3  ? ? ?GFR: ?CrCl cannot be calculated (Unknown ideal weight.). ? ?Liver Function Tests: ?Recent Labs  ?Lab 12/02/21 ?1245  ?AST 18  ?ALT 12  ?ALKPHOS 70  ?BILITOT 0.4  ?PROT 7.0  ?ALBUMIN 3.7  ? ? ?CBG: ?No results for input(s): GLUCAP in the last 168 hours. ? ? ?No results found for this or any previous visit (from the past 240 hour(s)).  ? ? ? ? ? ?Radiology Studies: ?No results found. ? ? ? ? ? ?Scheduled Meds: ? atorvastatin  40 mg Oral QHS  ? fluticasone furoate-vilanterol  1 puff Inhalation Daily  ? And  ? umeclidinium bromide  1 puff Inhalation Daily  ? furosemide  40 mg Oral q AM  ? irbesartan  150 mg Oral Daily  ? loratadine  10 mg Oral Daily  ? pantoprazole (PROTONIX) IV  40 mg Intravenous Q12H  ? ?Continuous Infusions: ? ? LOS: 0 days  ? ? ?Time spent: over 30  min ? ? ? ?Fayrene Helper, MD ?[facility] ? ? ?To contact the attending provider between 7A-7P or the covering provider during after hours 7P-7A, please log into the web site [email or web address] and access using universal [place] password for that web site. If you do not have the password, please call the hospital operator. ? ?12/03/2021, 4:47 PM  ? ? ?

## 2021-12-03 NOTE — Assessment & Plan Note (Addendum)
Seen on 11/20/2021 with concern for TIA ?At that time, seen by Dr. Leonie Man, notes presented with R hemispheric TIA due to small vessel disease, recommended adding pletal to daily plavix.  ?Continue antiplatelets ?

## 2021-12-03 NOTE — Assessment & Plan Note (Signed)
lipitor

## 2021-12-03 NOTE — Assessment & Plan Note (Addendum)
Stable Hb to 8.9 today ?Follow outpatient ? ?

## 2021-12-03 NOTE — Assessment & Plan Note (Signed)
Continue breo ellipta, incruse ellipta ?Albuterol prn ?

## 2021-12-03 NOTE — Assessment & Plan Note (Addendum)
PPI ?

## 2021-12-03 NOTE — Hospital Course (Addendum)
81 y.o. female with medical history significant of common iliac artery aneurysm, aortoiliac occlusive disease, Arnold-Chiari malformation, asthma/COPD, history of community-acquired pneumonia, bilateral occipital neuralgia, carotid artery stenosis, stage 3b CKD, history of MI, CAD/CABG x3, diverticulosis/diverticulitis episodes, GERD/hiatal hernia, right eye glaucoma, herpes zoster, hyperlipidemia, hypertension, lung cancer, peripheral vascular disease with lower extremity stent placement who was recently admitted and discharged from 11/18/2021 until 11/20/2021 due to transient right-sided numbness in the setting of TIA with cilostazol 100 mg p.o. twice daily added to clopidogrel 75 mg p.o. daily who is coming to the emergency department complaints of rectal bleeding and melanotic diarrhea.  She was admitted and antiplatelets were held.  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  Melena resolved.  Ok with discharge on plavix/pletal per GI, they're going to look into octreotide outpatient. ? ?See below for additional details ?

## 2021-12-03 NOTE — Assessment & Plan Note (Signed)
Echo with EF 53-01%, grade I diastolic dysfunction ?Appears euvolemic at this time ?Continue irbesartan, lasix  ?

## 2021-12-03 NOTE — Assessment & Plan Note (Addendum)
pletal and plavix lipitor ?

## 2021-12-03 NOTE — Assessment & Plan Note (Addendum)
lipitor ?pletal, plavix ?

## 2021-12-03 NOTE — Assessment & Plan Note (Signed)
Irbesartan, lasix ?

## 2021-12-03 NOTE — Assessment & Plan Note (Addendum)
Hx L CEA in 2011 ?S/p right carotid enarterectomy with bovine pericardial patch angioplasty on 09/26/2021 ?Plavix/pletal  ?Continue statin ?

## 2021-12-03 NOTE — Assessment & Plan Note (Addendum)
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  GI recommending regular diet, ok for home, ok for plavix/pletal, follow outpatient, he's going to work on octreotide. ?Recommending CTA with possible embolization if significant/overt GI bleeding in interim ?No BM today ?

## 2021-12-04 LAB — CBC WITH DIFFERENTIAL/PLATELET
Abs Immature Granulocytes: 0.01 10*3/uL (ref 0.00–0.07)
Basophils Absolute: 0 10*3/uL (ref 0.0–0.1)
Basophils Relative: 1 %
Eosinophils Absolute: 0.3 10*3/uL (ref 0.0–0.5)
Eosinophils Relative: 6 %
HCT: 26.3 % — ABNORMAL LOW (ref 36.0–46.0)
Hemoglobin: 8.4 g/dL — ABNORMAL LOW (ref 12.0–15.0)
Immature Granulocytes: 0 %
Lymphocytes Relative: 41 %
Lymphs Abs: 2.2 10*3/uL (ref 0.7–4.0)
MCH: 29.6 pg (ref 26.0–34.0)
MCHC: 31.9 g/dL (ref 30.0–36.0)
MCV: 92.6 fL (ref 80.0–100.0)
Monocytes Absolute: 0.5 10*3/uL (ref 0.1–1.0)
Monocytes Relative: 9 %
Neutro Abs: 2.4 10*3/uL (ref 1.7–7.7)
Neutrophils Relative %: 43 %
Platelets: 250 10*3/uL (ref 150–400)
RBC: 2.84 MIL/uL — ABNORMAL LOW (ref 3.87–5.11)
RDW: 17.2 % — ABNORMAL HIGH (ref 11.5–15.5)
WBC: 5.5 10*3/uL (ref 4.0–10.5)
nRBC: 0 % (ref 0.0–0.2)

## 2021-12-04 LAB — COMPREHENSIVE METABOLIC PANEL
ALT: 11 U/L (ref 0–44)
AST: 17 U/L (ref 15–41)
Albumin: 3.3 g/dL — ABNORMAL LOW (ref 3.5–5.0)
Alkaline Phosphatase: 61 U/L (ref 38–126)
Anion gap: 7 (ref 5–15)
BUN: 30 mg/dL — ABNORMAL HIGH (ref 8–23)
CO2: 28 mmol/L (ref 22–32)
Calcium: 9.2 mg/dL (ref 8.9–10.3)
Chloride: 105 mmol/L (ref 98–111)
Creatinine, Ser: 1.49 mg/dL — ABNORMAL HIGH (ref 0.44–1.00)
GFR, Estimated: 36 mL/min — ABNORMAL LOW (ref >60)
Glucose, Bld: 96 mg/dL (ref 70–99)
Potassium: 4.3 mmol/L (ref 3.5–5.1)
Sodium: 140 mmol/L (ref 135–145)
Total Bilirubin: 0.4 mg/dL (ref 0.3–1.2)
Total Protein: 6.1 g/dL — ABNORMAL LOW (ref 6.5–8.1)

## 2021-12-04 LAB — PHOSPHORUS: Phosphorus: 5.4 mg/dL — ABNORMAL HIGH (ref 2.5–4.6)

## 2021-12-04 LAB — MAGNESIUM: Magnesium: 2.1 mg/dL (ref 1.7–2.4)

## 2021-12-04 MED ORDER — IRBESARTAN 150 MG PO TABS: 150.0000 mg | ORAL_TABLET | Freq: Every day | ORAL | Status: DC

## 2021-12-04 NOTE — Progress Notes (Signed)
?PROGRESS NOTE ? ? ? ?[name]  WJX:[id number] DOB: 08-21-1940 DOA: 12/02/2021 ?PCP: [name], MD  ?Chief Complaint  ?Patient presents with  ? Rectal Bleeding  ? ? ?Brief Narrative:  ?81 y.o. female with medical history significant of common iliac artery aneurysm, aortoiliac occlusive disease, Arnold-Chiari malformation, asthma/COPD, history of community-acquired pneumonia, bilateral occipital neuralgia, carotid artery stenosis, stage 3b CKD, history of MI, CAD/CABG x3, diverticulosis/diverticulitis episodes, GERD/hiatal hernia, right eye glaucoma, herpes zoster, hyperlipidemia, hypertension, lung cancer, peripheral vascular disease with lower extremity stent placement who was recently admitted and discharged from 11/18/2021 until 11/20/2021 due to transient right-sided numbness in the setting of TIA with cilostazol 100 mg p.o. twice daily added to clopidogrel 75 mg p.o. daily who is coming to the emergency department complaints of rectal bleeding and melanotic diarrhea since yesterday.  She's been admitted with GI bleed.  GI has been consulted. ? ?See below for additional details  ? ? ?Assessment & Plan: ?  ?Principal Problem: ?  Melena ?Active Problems: ?  Rectal bleeding ?  Iron deficiency anemia due to chronic blood loss ?  Chronic diastolic CHF (congestive heart failure) ([place]) ?  COPD GOLD II ?  History of transient ischemic attack (TIA) ?  S/P carotid endarterectomy ?  Peripheral vascular disease ([place]) ?  CAD (coronary artery disease) ?  HLD (hyperlipidemia) ?  Essential hypertension ?  GERD (gastroesophageal reflux disease) ?  GI bleed ? ? 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  ? If candidate for long acting octreotide given freq of  bleeding. ?Recommending CTA with possible embolization if significant/overt GI bleeding in interim ?Stool less melanotic today, follow ?Will continue to hold plavix/pletal ? ?Iron deficiency anemia due to chronic blood loss ?Hb 9.9 4/8 ?downtrending to 8 on 4/23 ?Will continue to trend ? ? ?Chronic diastolic CHF (congestive heart failure) ([place]) ?Echo with EF 21-30%, grade I diastolic dysfunction ?Appears euvolemic at this time ?Continue irbesartan, lasix  ? ?S/P carotid endarterectomy ?Hx L CEA in 2011 ?S/p right carotid enarterectomy with bovine pericardial patch angioplasty on 09/26/2021 ?Plavix/pletal on hold ?Continue statin ? ?History of transient ischemic attack (TIA) ?Seen on 11/20/2021 with concern for TIA ?At that time, seen by Dr. Leonie Man, notes presented with R hemispheric TIA due to small vessel disease, recommended adding pletal to daily plavix.  ?Currently plavix/pletal on hold with above ? ?COPD GOLD II ?Continue breo ellipta, incruse ellipta ?Albuterol prn ? ?Peripheral vascular disease ([place]) ?Holding pletal and plavix due to GI bleeding above ?lipitor ? ?CAD (coronary artery disease) ?lipitor ?Holding pletal, plavix ? ?HLD (hyperlipidemia) ?lipitor ? ?Essential hypertension ?Irbesartan, lasix ? ?GERD (gastroesophageal reflux disease) ?PPI BID ? ? ?DVT prophylaxis: SCD ?Code Status: DNR ?Family Communication: none ?Disposition:  ? ?Status is: Observation ?The patient will require care spanning > 2 midnights and should be moved to inpatient because: need for GI eva, GI bleeding ?  ?Consultants:  ?GI ? ?Procedures:  ?none ? ?Antimicrobials:  ?Anti-infectives (From admission, onward)  ? ? None  ? ?  ? ? ?Subjective: ?Green stool this am ? ?Objective: ?Vitals:  ? 12/03/21 1955 12/04/21 0538 12/04/21 0819 12/04/21 1353  ?BP: (!) 147/50 (!) 118/51 126/76 (!) 101/49  ?Pulse: 61 (!) 56 (!) 54 61  ?Resp: 16 16 18 18   ?Temp: 98.7 ?  F (37.1 ?C) 97.7 ?F (36.5 ?C) 97.6 ?F (36.4 ?C) 98.2 ?F (36.8 ?C)  ?TempSrc: Oral  Oral Oral Oral  ?SpO2: 98% 94% 100% 97%  ?Height:      ? ? ?Intake/Output Summary (Last 24 hours) at 12/04/2021 1743 ?Last data filed at 12/04/2021 2060 ?Gross per 24 hour  ?Intake 460 ml  ?Output 1100 ml  ?Net -640 ml  ? ?There were no vitals filed for this visit. ? ?Examination: ? ?General: No acute distress. ?Cardiovascular: RRR ?Lungs: unlabored ?Abdomen: Soft, nontender, nondistended  ?Neurological: Alert and oriented ?3. Moves all extremities ?4 . Cranial nerves II through XII grossly intact. ?Skin: Warm and dry. No rashes or lesions. ?Extremities: No clubbing or cyanosis. No edema.  ? ? ? ?Data Reviewed: I have personally reviewed following labs and imaging studies ? ?CBC: ?Recent Labs  ?Lab 12/02/21 ?1245 12/02/21 ?1840 12/03/21 ?0136 12/03/21 ?1561 12/03/21 ?1817 12/04/21 ?5379  ?WBC 6.8  --   --   --   --  5.5  ?NEUTROABS 4.4  --   --   --   --  2.4  ?HGB 9.1* 8.3* 7.9* 8.0* 8.8* 8.4*  ?HCT 27.3* 26.7* 24.0* 24.7* 26.9* 26.3*  ?MCV 91.0  --   --   --   --  92.6  ?PLT 274  --   --   --   --  250  ? ? ?Basic Metabolic Panel: ?Recent Labs  ?Lab 12/02/21 ?1245 12/03/21 ?4327 12/04/21 ?6147  ?NA 139 140 140  ?K 4.2 4.0 4.3  ?CL 106 107 105  ?CO2 26 27 28   ?GLUCOSE 110* 101* 96  ?BUN 33* 30* 30*  ?CREATININE 1.45* 1.22* 1.49*  ?CALCIUM 9.4 9.3 9.2  ?MG  --   --  2.1  ?PHOS  --   --  5.4*  ? ? ?GFR: ?CrCl cannot be calculated (Unknown ideal weight.). ? ?Liver Function Tests: ?Recent Labs  ?Lab 12/02/21 ?1245 12/04/21 ?0438  ?AST 18 17  ?ALT 12 11  ?ALKPHOS 70 61  ?BILITOT 0.4 0.4  ?PROT 7.0 6.1*  ?ALBUMIN 3.7 3.3*  ? ? ?CBG: ?No results for input(s): GLUCAP in the last 168 hours. ? ? ?No results found for this or any previous visit (from the past 240 hour(s)).  ? ? ? ? ? ?Radiology Studies: ?No results found. ? ? ? ? ? ?Scheduled Meds: ? atorvastatin  40 mg Oral QHS  ? fluticasone furoate-vilanterol  1 puff Inhalation Daily  ? And  ? umeclidinium bromide  1 puff Inhalation Daily  ? furosemide  40 mg Oral q AM  ?  irbesartan  150 mg Oral Daily  ? loratadine  10 mg Oral Daily  ? pantoprazole (PROTONIX) IV  40 mg Intravenous Q12H  ? ?Continuous Infusions: ? ? LOS: 1 day  ? ? ?Time spent: over 30 min ? ? ? ?Fayrene Helper, MD ?[facility] ? ? ?To contact the attending provider between 7A-7P or the covering provider during after hours 7P-7A, please log into the web site [email or web address] and access using universal [place] password for that web site. If you do not have the password, please call the hospital operator. ? ?12/04/2021, 5:43 PM  ? ? ?

## 2021-12-04 NOTE — Progress Notes (Signed)
Subjective: ?No complaints. ? ?Objective: ?Vital signs in last 24 hours: ?Temp:  [97.7 ?F (36.5 ?C)-98.7 ?F (37.1 ?C)] 97.7 ?F (36.5 ?C) (04/24 4287) ?Pulse Rate:  [52-61] 56 (04/24 0538) ?Resp:  [16] 16 (04/24 0538) ?BP: (118-147)/(49-51) 118/51 (04/24 0538) ?SpO2:  [94 %-99 %] 94 % (04/24 0538) ?Last BM Date : 12/03/21 ? ?Intake/Output from previous day: ?04/23 0701 - 04/24 0700 ?In: 240 [P.O.:240] ?Out: 1100 [Urine:1100] ?Intake/Output this shift: ?No intake/output data recorded. ? ?General appearance: alert and no distress ?GI: soft, non-tender; bowel sounds normal; no masses,  no organomegaly ? ?Lab Results: ?Recent Labs  ?  12/02/21 ?1245 12/02/21 ?1840 12/03/21 ?6811 12/03/21 ?1817 12/04/21 ?5726  ?WBC 6.8  --   --   --  5.5  ?HGB 9.1*   < > 8.0* 8.8* 8.4*  ?HCT 27.3*   < > 24.7* 26.9* 26.3*  ?PLT 274  --   --   --  250  ? < > = values in this interval not displayed.  ? ?BMET ?Recent Labs  ?  12/02/21 ?1245 12/03/21 ?2035 12/04/21 ?5974  ?NA 139 140 140  ?K 4.2 4.0 4.3  ?CL 106 107 105  ?CO2 26 27 28   ?GLUCOSE 110* 101* 96  ?BUN 33* 30* 30*  ?CREATININE 1.45* 1.22* 1.49*  ?CALCIUM 9.4 9.3 9.2  ? ?LFT ?Recent Labs  ?  12/04/21 ?0438  ?PROT 6.1*  ?ALBUMIN 3.3*  ?AST 17  ?ALT 11  ?ALKPHOS 61  ?BILITOT 0.4  ? ?PT/INR ?Recent Labs  ?  12/02/21 ?1246  ?LABPROT 13.3  ?INR 1.0  ? ?Hepatitis Panel ?No results for input(s): HEPBSAG, HCVAB, HEPAIGM, HEPBIGM in the last 72 hours. ?C-Diff ?No results for input(s): CDIFFTOX in the last 72 hours. ?Fecal Lactopherrin ?No results for input(s): FECLLACTOFRN in the last 72 hours. ? ?Studies/Results: ?No results found. ? ?Medications: Scheduled: ? atorvastatin  40 mg Oral QHS  ? fluticasone furoate-vilanterol  1 puff Inhalation Daily  ? And  ? umeclidinium bromide  1 puff Inhalation Daily  ? furosemide  40 mg Oral q AM  ? irbesartan  150 mg Oral Daily  ? loratadine  10 mg Oral Daily  ? pantoprazole (PROTONIX) IV  40 mg Intravenous Q12H  ? ?Continuous: ? ?Assessment/Plan: ?1)  Melena. ?2) History of upper and lower AVMs. ?3) Anemia. ? ? Her HGB is relatively stable.  There was a mild decline from the baseline around 9 g/dL.  The bowel movement this AM was formed and a dark green color.  It was not like the melena that she had at the time of admission. ? ?Plan: ?1) Enteroscopy with APC tomorrow. ?2) Follow HGB and transfuse as necessary. ? LOS: 1 day  ? ?Franz Svec D ?12/04/2021, 8:14 AM  ?

## 2021-12-04 NOTE — H&P (View-Only) (Signed)
Subjective: ?No complaints. ? ?Objective: ?Vital signs in last 24 hours: ?Temp:  [97.7 ?F (36.5 ?C)-98.7 ?F (37.1 ?C)] 97.7 ?F (36.5 ?C) (04/24 0762) ?Pulse Rate:  [52-61] 56 (04/24 0538) ?Resp:  [16] 16 (04/24 0538) ?BP: (118-147)/(49-51) 118/51 (04/24 0538) ?SpO2:  [94 %-99 %] 94 % (04/24 0538) ?Last BM Date : 12/03/21 ? ?Intake/Output from previous day: ?04/23 0701 - 04/24 0700 ?In: 240 [P.O.:240] ?Out: 1100 [Urine:1100] ?Intake/Output this shift: ?No intake/output data recorded. ? ?General appearance: alert and no distress ?GI: soft, non-tender; bowel sounds normal; no masses,  no organomegaly ? ?Lab Results: ?Recent Labs  ?  12/02/21 ?1245 12/02/21 ?1840 12/03/21 ?2633 12/03/21 ?1817 12/04/21 ?3545  ?WBC 6.8  --   --   --  5.5  ?HGB 9.1*   < > 8.0* 8.8* 8.4*  ?HCT 27.3*   < > 24.7* 26.9* 26.3*  ?PLT 274  --   --   --  250  ? < > = values in this interval not displayed.  ? ?BMET ?Recent Labs  ?  12/02/21 ?1245 12/03/21 ?6256 12/04/21 ?3893  ?NA 139 140 140  ?K 4.2 4.0 4.3  ?CL 106 107 105  ?CO2 26 27 28   ?GLUCOSE 110* 101* 96  ?BUN 33* 30* 30*  ?CREATININE 1.45* 1.22* 1.49*  ?CALCIUM 9.4 9.3 9.2  ? ?LFT ?Recent Labs  ?  12/04/21 ?0438  ?PROT 6.1*  ?ALBUMIN 3.3*  ?AST 17  ?ALT 11  ?ALKPHOS 61  ?BILITOT 0.4  ? ?PT/INR ?Recent Labs  ?  12/02/21 ?1246  ?LABPROT 13.3  ?INR 1.0  ? ?Hepatitis Panel ?No results for input(s): HEPBSAG, HCVAB, HEPAIGM, HEPBIGM in the last 72 hours. ?C-Diff ?No results for input(s): CDIFFTOX in the last 72 hours. ?Fecal Lactopherrin ?No results for input(s): FECLLACTOFRN in the last 72 hours. ? ?Studies/Results: ?No results found. ? ?Medications: Scheduled: ? atorvastatin  40 mg Oral QHS  ? fluticasone furoate-vilanterol  1 puff Inhalation Daily  ? And  ? umeclidinium bromide  1 puff Inhalation Daily  ? furosemide  40 mg Oral q AM  ? irbesartan  150 mg Oral Daily  ? loratadine  10 mg Oral Daily  ? pantoprazole (PROTONIX) IV  40 mg Intravenous Q12H  ? ?Continuous: ? ?Assessment/Plan: ?1)  Melena. ?2) History of upper and lower AVMs. ?3) Anemia. ? ? Her HGB is relatively stable.  There was a mild decline from the baseline around 9 g/dL.  The bowel movement this AM was formed and a dark green color.  It was not like the melena that she had at the time of admission. ? ?Plan: ?1) Enteroscopy with APC tomorrow. ?2) Follow HGB and transfuse as necessary. ? LOS: 1 day  ? ?Wilson Dusenbery D ?12/04/2021, 8:14 AM  ?

## 2021-12-05 ENCOUNTER — Encounter (HOSPITAL_COMMUNITY)
Admission: EM | Disposition: A | Discharge status: Home / Self Care | Payer: Self-pay | Source: Home / Self Care | Attending: Family Medicine

## 2021-12-05 ENCOUNTER — Inpatient Hospital Stay (HOSPITAL_COMMUNITY): Payer: Medicare Other | Admitting: Anesthesiology

## 2021-12-05 ENCOUNTER — Encounter (HOSPITAL_COMMUNITY): Payer: Self-pay | Admitting: Family Medicine

## 2021-12-05 DIAGNOSIS — K31819 Angiodysplasia of stomach and duodenum without bleeding: Secondary | ICD-10-CM

## 2021-12-05 DIAGNOSIS — I2510 Atherosclerotic heart disease of native coronary artery without angina pectoris: Secondary | ICD-10-CM

## 2021-12-05 DIAGNOSIS — K5520 Angiodysplasia of colon without hemorrhage: Secondary | ICD-10-CM

## 2021-12-05 DIAGNOSIS — I110 Hypertensive heart disease with heart failure: Secondary | ICD-10-CM

## 2021-12-05 HISTORY — PX: HOT HEMOSTASIS: SHX5433

## 2021-12-05 HISTORY — PX: ENTEROSCOPY: SHX5533

## 2021-12-05 LAB — CBC WITH DIFFERENTIAL/PLATELET
Abs Immature Granulocytes: 0.02 10*3/uL (ref 0.00–0.07)
Basophils Absolute: 0 10*3/uL (ref 0.0–0.1)
Basophils Relative: 1 %
Eosinophils Absolute: 0.4 10*3/uL (ref 0.0–0.5)
Eosinophils Relative: 7 %
HCT: 27.6 % — ABNORMAL LOW (ref 36.0–46.0)
Hemoglobin: 8.9 g/dL — ABNORMAL LOW (ref 12.0–15.0)
Immature Granulocytes: 0 %
Lymphocytes Relative: 38 %
Lymphs Abs: 2.4 10*3/uL (ref 0.7–4.0)
MCH: 29.8 pg (ref 26.0–34.0)
MCHC: 32.2 g/dL (ref 30.0–36.0)
MCV: 92.3 fL (ref 80.0–100.0)
Monocytes Absolute: 0.7 10*3/uL (ref 0.1–1.0)
Monocytes Relative: 11 %
Neutro Abs: 2.8 10*3/uL (ref 1.7–7.7)
Neutrophils Relative %: 43 %
Platelets: 278 10*3/uL (ref 150–400)
RBC: 2.99 MIL/uL — ABNORMAL LOW (ref 3.87–5.11)
RDW: 16.9 % — ABNORMAL HIGH (ref 11.5–15.5)
WBC: 6.3 10*3/uL (ref 4.0–10.5)
nRBC: 0 % (ref 0.0–0.2)

## 2021-12-05 LAB — COMPREHENSIVE METABOLIC PANEL
ALT: 11 U/L (ref 0–44)
AST: 19 U/L (ref 15–41)
Albumin: 3.3 g/dL — ABNORMAL LOW (ref 3.5–5.0)
Alkaline Phosphatase: 65 U/L (ref 38–126)
Anion gap: 6 (ref 5–15)
BUN: 30 mg/dL — ABNORMAL HIGH (ref 8–23)
CO2: 29 mmol/L (ref 22–32)
Calcium: 9.3 mg/dL (ref 8.9–10.3)
Chloride: 102 mmol/L (ref 98–111)
Creatinine, Ser: 1.44 mg/dL — ABNORMAL HIGH (ref 0.44–1.00)
GFR, Estimated: 37 mL/min — ABNORMAL LOW (ref >60)
Glucose, Bld: 101 mg/dL — ABNORMAL HIGH (ref 70–99)
Potassium: 4.4 mmol/L (ref 3.5–5.1)
Sodium: 137 mmol/L (ref 135–145)
Total Bilirubin: 0.4 mg/dL (ref 0.3–1.2)
Total Protein: 6.4 g/dL — ABNORMAL LOW (ref 6.5–8.1)

## 2021-12-05 LAB — PHOSPHORUS: Phosphorus: 4.3 mg/dL (ref 2.5–4.6)

## 2021-12-05 LAB — MAGNESIUM: Magnesium: 2.1 mg/dL (ref 1.7–2.4)

## 2021-12-05 SURGERY — ENTEROSCOPY
Anesthesia: Monitor Anesthesia Care

## 2021-12-05 MED ORDER — SODIUM CHLORIDE 0.9 % IV SOLN: INTRAVENOUS | Status: DC

## 2021-12-05 MED ORDER — LACTATED RINGERS IV SOLN: INTRAVENOUS | Status: DC | PRN

## 2021-12-05 MED ORDER — PROPOFOL 500 MG/50ML IV EMUL
INTRAVENOUS | Status: DC | PRN
  Administered 2021-12-05: 70 ug/kg/min via INTRAVENOUS

## 2021-12-05 MED ORDER — PROPOFOL 10 MG/ML IV BOLUS
INTRAVENOUS | Status: DC | PRN
  Administered 2021-12-05: 30 mg via INTRAVENOUS
  Administered 2021-12-05: 20 mg via INTRAVENOUS
  Administered 2021-12-05: 40 mg via INTRAVENOUS

## 2021-12-05 MED ORDER — LIDOCAINE 2% (20 MG/ML) 5 ML SYRINGE
INTRAMUSCULAR | Status: DC | PRN
Start: 2021-12-05 — End: 2021-12-05
  Administered 2021-12-05: 60 mg via INTRAVENOUS

## 2021-12-05 NOTE — Discharge Summary (Signed)
Physician Discharge Summary  ?[name] ERX:[id number] DOB: 1940/11/09 DOA: 12/02/2021 ? ?PCP: [name], MD ? ?Admit date: 12/02/2021 ?Discharge date: 12/05/2021 ? ?Time spent: 40 minutes ? ?Recommendations for Outpatient Follow-up:  ?Follow outpatient CBC/CMP  ?Follow with Dr. Benson Norway outpatient regarding octreotide  ? ?Discharge Diagnoses:  ?Principal Problem: ?  Melena ?Active Problems: ?  Rectal bleeding ?  Iron deficiency anemia due to chronic blood loss ?  Chronic diastolic CHF (congestive heart failure) ([place]) ?  COPD GOLD II ?  History of transient ischemic attack (TIA) ?  S/P carotid endarterectomy ?  Peripheral vascular disease ([place]) ?  CAD (coronary artery disease) ?  HLD (hyperlipidemia) ?  Essential hypertension ?  GERD (gastroesophageal reflux disease) ?  GI bleed ? ? ?Discharge Condition: stable ? ?Diet recommendation: heart healthy ? ?There were no vitals filed for this visit. ? ?History of present illness:  ?81 y.o. female with medical history significant of common iliac artery aneurysm, aortoiliac occlusive disease, Arnold-Chiari malformation, asthma/COPD, history of community-acquired pneumonia, bilateral occipital neuralgia, carotid artery stenosis, stage 3b CKD, history of MI, CAD/CABG x3, diverticulosis/diverticulitis episodes, GERD/hiatal hernia, right eye glaucoma, herpes zoster, hyperlipidemia, hypertension, lung cancer, peripheral vascular disease with lower extremity stent placement who was recently admitted and discharged from 11/18/2021 until 11/20/2021 due to transient right-sided numbness in the setting of TIA with cilostazol 100 mg p.o. twice daily added to clopidogrel 75 mg p.o. daily who is coming to the emergency department complaints of rectal bleeding and melanotic diarrhea.  She was admitted and antiplatelets were held.  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  Melena resolved.  Ok  with discharge on plavix/pletal per GI, they're going to look into octreotide outpatient. ? ?See below for additional details ? 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  GI recommending regular diet, ok for home, ok for plavix/pletal, follow outpatient, he's going to work on octreotide. ?Recommending CTA with possible embolization if significant/overt GI bleeding in interim ?No BM today ? ?Iron deficiency anemia due to chronic blood loss ?Stable Hb to 8.9 today ?Follow outpatient ? ? ?Chronic diastolic CHF (congestive heart failure) ([place]) ?Echo with EF 95-09%, grade I diastolic dysfunction ?Appears euvolemic at this time ?Continue irbesartan, lasix  ? ?S/P carotid endarterectomy ?Hx L CEA in 2011 ?S/p right carotid enarterectomy with bovine pericardial patch angioplasty on 09/26/2021 ?Plavix/pletal  ?Continue statin ? ?History of transient ischemic attack (TIA) ?Seen on 11/20/2021 with concern for TIA ?At that time, seen by Dr. Leonie Man, notes presented with R hemispheric TIA due to small vessel disease, recommended adding pletal to daily plavix.  ?Continue antiplatelets ? ?COPD GOLD II ?Continue breo ellipta, incruse ellipta ?Albuterol prn ? ?Peripheral vascular disease ([place]) ?pletal and plavix lipitor ? ?CAD (coronary artery disease) ?lipitor ?pletal, plavix ? ?HLD (hyperlipidemia) ?lipitor ? ?Essential hypertension ?Irbesartan, lasix ? ?GERD (gastroesophageal reflux disease) ?PPI  ? ? ?Procedures: ?Small bowel endoscopy  ? ?Consultations: ?GI ? ?Discharge Exam: ?Vitals:  ? 12/05/21 1516 12/05/21 1535  ?BP: (!) 136/37 (!) 138/51  ?Pulse: (!) 57 (!) 56  ?Resp: (!) 26 18   ?Temp:  (!) 97.5 ?F (36.4 ?C)  ?  SpO2: 95% 100%  ? ?No complaints ? ?General: No acute distress. ?Cardiovascular: RRR ?Lungs: unlabored ?Abdomen: Soft, nontender, nondistended ?Neurological: Alert and oriented ?3. Moves all extremities ?4 . Cranial nerves II through XII grossly intact. ?Skin: Warm and dry. No rashes or lesions. ?Extremities: No clubbing or cyanosis. No edema. ? ?Discharge Instructions ? ? ?Discharge Instructions   ? ? Call MD for:  difficulty breathing, headache or visual disturbances   Complete by: As directed ?  ? Call MD for:  extreme fatigue   Complete by: As directed ?  ? Call MD for:  hives   Complete by: As directed ?  ? Call MD for:  persistant dizziness or light-headedness   Complete by: As directed ?  ? Call MD for:  persistant nausea and vomiting   Complete by: As directed ?  ? Call MD for:  redness, tenderness, or signs of infection (pain, swelling, redness, odor or green/yellow discharge around incision site)   Complete by: As directed ?  ? Call MD for:  severe uncontrolled pain   Complete by: As directed ?  ? Call MD for:  temperature >100.4   Complete by: As directed ?  ? Diet - low sodium heart healthy   Complete by: As directed ?  ? Discharge instructions   Complete by: As directed ?  ? You were seen for gastrointestinal bleeding with melena (black, tarry stool). ? ?You've improved with holding your plavix and pletal. ? ?You had and enteroscopy with Dr. 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   ? ?Go ahead and resume your plavix and pletal.  Follow up with Dr. Benson Norway as an outpatient.  He's going to try to work on obtaining octreotide for you as an outpatient. ? ?Return for new, recurrent, or worsening symptoms. ? ?Please ask your PCP to request records from this hospitalization so they know what was done and what the next steps will be.  ? Increase activity slowly   Complete by: As directed ?  ? ?  ? ?Allergies as of  12/05/2021   ? ?   Reactions  ? Avelox [moxifloxacin Hcl In Nacl] Palpitations, Other (See Comments)  ? Caused Heart Attack   ? Azithromycin Swelling, Other (See Comments)  ? Patient reported past history of lip swelling  ? Codeine Other (See Comments)  ? Dr. Terrence Dupont advised patient not to take this medication  ? Doxycycline Swelling, Other (See Comments)  ? Mouth, lips, feet swelling  ? Hydromorphone Palpitations, Other (See Comments)  ? DILAUDID  -  Pt had Kaliq Lege Heart Attack after taking Dilaudid.  ? Levaquin [levofloxacin] Shortness Of Breath, Other (See Comments)  ? Chest pressure, SOB, "pain in between shoulder blades", sweaty -as reported by patient per experience in ED this afternoon  ? Vitamin D Analogs Swelling, Other (See Comments)  ? Face and lips swell, but no breathing issues  ? Nifedipine Er Other (See Comments)  ? Dropped the heart rate too low  ? Oxycodone-acetaminophen Other (See Comments)  ? Says it makes her "feel weird"  ? Risedronate Other (See Comments)  ? Chest pain  ? ?  ? ?  ?Medication List  ?  ? ?TAKE these medications   ? ?acetaminophen 500 MG tablet ?Commonly known as: TYLENOL ?Take 500-1,000 mg by mouth every 6 (six) hours as needed (for pain). ?  ?albuterol 108 (90 Base) MCG/ACT inhaler ?Commonly known as: VENTOLIN HFA ?Inhale 2 puffs into the  lungs every 6 (six) hours as needed for wheezing or shortness of breath. ?  ?atorvastatin 40 MG tablet ?Commonly known as: LIPITOR ?Take 1 tablet (40 mg total) by mouth at bedtime. ?  ?cetirizine 10 MG tablet ?Commonly known as: ZYRTEC ?Take 10 mg by mouth daily as needed for allergies. ?  ?cilostazol 100 MG tablet ?Commonly known as: PLETAL ?Take 1 tablet (100 mg total) by mouth 2 (two) times daily. ?  ?clopidogrel 75 MG tablet ?Commonly known as: PLAVIX ?Take 1 tablet (75 mg total) by mouth daily. ?What changed: when to take this ?  ?dexlansoprazole 60 MG capsule ?Commonly known as: Dexilant ?Take 1 capsule (60 mg total) by mouth daily before  breakfast. ?  ?furosemide 40 MG tablet ?Commonly known as: LASIX ?Take 40 mg by mouth in the morning. ?  ?Magnesium Oxide 400 MG Caps ?Take 400 mg by mouth in the morning. ?  ?nitroGLYCERIN 0.2 mg/hr patch ?Commonly know

## 2021-12-05 NOTE — Interval H&P Note (Signed)
History and Physical Interval Note: ? ?12/05/2021 ?2:21 PM ? ?[name]  has presented today for surgery, with the diagnosis of Melena and history of AVMs.  The various methods of treatment have been discussed with the patient and family. After consideration of risks, benefits and other options for treatment, the patient has consented to  Procedure(s): ?ENTEROSCOPY (N/A) as a surgical intervention.  The patient's history has been reviewed, patient examined, no change in status, stable for surgery.  I have reviewed the patient's chart and labs.  Questions were answered to the patient's satisfaction.   ? ? ?Eryk Beavers D ? ? ?

## 2021-12-05 NOTE — Transfer of Care (Signed)
Immediate Anesthesia Transfer of Care Note ? ?Patient: ESTELLA MALATESTA ? ?Procedure(s) Performed: ENTEROSCOPY ?HOT HEMOSTASIS (ARGON PLASMA COAGULATION/BICAP) ? ?Patient Location: PACU ? ?Anesthesia Type:MAC ? ?Level of Consciousness: awake, alert , oriented and patient cooperative ? ?Airway & Oxygen Therapy: Patient Spontanous Breathing and Patient connected to face mask oxygen ? ?Post-op Assessment: Report given to RN and Post -op Vital signs reviewed and stable ? ?Post vital signs: Reviewed and stable ? ?Last Vitals:  ?Vitals Value Taken Time  ?BP 114/34 12/05/21 1455  ?Temp 36.8 ?C 12/05/21 1455  ?Pulse 70 12/05/21 1500  ?Resp 27 12/05/21 1500  ?SpO2 96 % 12/05/21 1500  ?Vitals shown include unvalidated device data. ? ?Last Pain:  ?Vitals:  ? 12/05/21 1455  ?TempSrc: Temporal  ?PainSc:   ?   ? ?Patients Stated Pain Goal: 0 (12/04/21 1525) ? ?Complications: No notable events documented. ?

## 2021-12-05 NOTE — Anesthesia Postprocedure Evaluation (Signed)
Anesthesia Post Note ? ?Patient: [name] ? ?Procedure(s) Performed: ENTEROSCOPY ?HOT HEMOSTASIS (ARGON PLASMA COAGULATION/BICAP) ? ?  ? ?Patient location during evaluation: PACU ?Anesthesia Type: MAC ?Level of consciousness: awake and alert ?Pain management: pain level controlled ?Vital Signs Assessment: post-procedure vital signs reviewed and stable ?Respiratory status: spontaneous breathing, nonlabored ventilation and respiratory function stable ?Cardiovascular status: blood pressure returned to baseline ?Postop Assessment: no apparent nausea or vomiting ?Anesthetic complications: no ? ? ?No notable events documented. ? ?Last Vitals:  ?Vitals:  ? 12/05/21 1516 12/05/21 1535  ?BP: (!) 136/37 (!) 138/51  ?Pulse: (!) 57 (!) 56  ?Resp: (!) 26 18  ?Temp:  (!) 36.4 ?C  ?SpO2: 95% 100%  ?  ? ?  ?  ?  ?  ?  ?  ? ?Marthenia Rolling ? ? ? ? ?

## 2021-12-05 NOTE — Clinical Social Work Note (Signed)
?  Transition of Care (TOC) Screening Note ? ? ?Patient Details  ?Name: [name] ?Date of Birth: [birth year] ? ? ?Transition of Care (TOC) CM/SW Contact:    ?Ross Ludwig, LCSW ?Phone Number: ?12/05/2021, 4:46 PM ? ? ? ?Transition of Care Department [facility]) has reviewed patient and no TOC needs have been identified at this time. We will continue to monitor patient advancement through interdisciplinary progression rounds. If new patient transition needs arise, please place a TOC consult. ?  ?

## 2021-12-05 NOTE — Op Note (Signed)
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                          intestine ?Providers:                Carol Ada, MD, Ladoris Gene, RN, [facility],  ?                          Technician ?Referring MD:              ?Medicines:                Propofol per Anesthesia ?Complications:            No immediate complications. ?Estimated Blood Loss:     Estimated blood loss: none. ?Procedure:                Pre-Anesthesia Assessment: ?                          - Prior to the procedure, a History and Physical  ?                          was performed, and patient medications and  ?                          allergies were reviewed. The patient's tolerance of  ?                          previous anesthesia was also reviewed. The risks  ?                          and benefits of the procedure and the sedation  ?                          options and risks were discussed with the patient.  ?                          All questions were answered, and informed consent  ?                          was obtained. Prior Anticoagulants: The patient has  ?                          taken Plavix (clopidogrel), last dose was 3 days  ?                          prior to procedure. ASA Grade Assessment: III - A  ?                          patient  with severe systemic disease. After  ?                          reviewing the risks and benefits, the patient was  ?                          deemed in satisfactory condition to undergo the  ?                          procedure. ?                          - Sedation was administered by an anesthesia  ?                          professional. Deep sedation was attained. ?                          After  obtaining informed consent, the endoscope was  ?                          passed under direct vision. Throughout the  ?                          procedure, the patient's blood pressure, pulse, and  ?                          oxygen saturations were monitored continuously. The  ?                          [id number]-HQ190L ([id number]) Olympus colonoscope was  ?                          introduced through the mouth and advanced to the  ?                          small bowel distal to the Ligament of Treitz. The  ?                          small bowel enteroscopy was accomplished without  ?                          difficulty. The patient tolerated the procedure  ?                          well. ?Scope In: ?Scope Out: ?Findings: ?     The esophagus was normal. ?     The stomach was normal. ?     A few angiodysplastic lesions with no bleeding were found in the third  ?     portion of the duodenum and in the fourth portion of the duodenum.  ?     Coagulation for tissue destruction using monopolar probe was successful. ?     A few angiodysplastic lesions with no bleeding were found in the  ?     proximal jejunum. Coagulation for tissue destruction using  monopolar  ?     probe was successful. ?Impression:               - Normal esophagus. ?                          - Normal stomach. ?                          - A few non-bleeding angiodysplastic lesions in the  ?                          duodenum. Treated with a monopolar probe. ?                          - A few non-bleeding angiodysplastic lesions in the  ?                          jejunum. Treated with a monopolar probe. ?                          - No specimens collected. ?Recommendation:           - Return patient to hospital ward for ongoing care. ?                          - Resume regular diet. ?                          - Okay to D/C home. ?                          - Okay to resume Plavix and Pletal. ?                          - Outpatient arrangements will be made to  obtain  ?                          Octreotide LAR. ?                          - Follow up in the office in 2 weeks. ?Procedure Code(s):        --- Professional --- ?                          [phone], Small intestinal endoscopy, enteroscopy  ?                          beyond second portion of duodenum, not including  ?                          ileum; with ablation of tumor(s), polyp(s), or  ?                          other lesion(s) not amenable to removal by hot  ?  biopsy forceps, bipolar cautery or snare technique ?Diagnosis Code(s):        --- Professional --- ?                          D55.208, Angiodysplasia of stomach and duodenum  ?                          without bleeding ?                          K55.20, Angiodysplasia of colon without hemorrhage ?                          K92.1, Melena (includes Hematochezia) ?CPT copyright 2019 [employer]. All rights reserved. ?The codes documented in this report are preliminary and upon coder review may  ?be revised to meet current compliance requirements. ?Carol Ada, MD ?Carol Ada, MD ?12/05/2021 2:58:41 PM ?This report has been signed electronically. ?Number of Addenda: 0 ?

## 2021-12-05 NOTE — Anesthesia Preprocedure Evaluation (Addendum)
Anesthesia Evaluation  ?Patient identified by MRN, date of birth, ID band ?Patient awake ? ? ? ?Reviewed: ?Allergy & Precautions, Patient's Chart, lab work & pertinent test results ? ?History of Anesthesia Complications ?(+) PONV and history of anesthetic complications ? ?Airway ?Mallampati: II ? ?TM Distance: >3 FB ?Neck ROM: Full ? ? ? Dental ? ?(+) Lower Dentures, Upper Dentures ?  ?Pulmonary ?COPD,  COPD inhaler, former smoker,  ?  ?Pulmonary exam normal ? ? ? ? ? ? ? Cardiovascular ?hypertension, + CAD, + Past MI, + Peripheral Vascular Disease and +CHF  ?Normal cardiovascular exam ? ? ?  ?Neuro/Psych ? Headaches, TIAnegative psych ROS  ? GI/Hepatic ?Neg liver ROS, hiatal hernia, GERD  ,  ?Endo/Other  ?negative endocrine ROS ? Renal/GU ?Renal InsufficiencyRenal disease  ?negative genitourinary ?  ?Musculoskeletal ?negative musculoskeletal ROS ?(+)  ? Abdominal ?  ?Peds ? Hematology ? ?(+) Blood dyscrasia, anemia , Hgb 8.9   ?Anesthesia Other Findings ?Day of surgery medications reviewed with patient. ? Reproductive/Obstetrics ?negative OB ROS ? ?  ? ? ? ? ? ? ? ? ? ? ? ? ? ?  ?  ? ? ? ? ? ? ? ?Anesthesia Physical ?Anesthesia Plan ? ?ASA: 3 ? ?Anesthesia Plan: MAC  ? ?Post-op Pain Management: Minimal or no pain anticipated  ? ?Induction:  ? ?PONV Risk Score and Plan: 3 and Treatment may vary due to age or medical condition and Propofol infusion ? ?Airway Management Planned: Natural Airway and Nasal Cannula ? ?Additional Equipment: None ? ?Intra-op Plan:  ? ?Post-operative Plan:  ? ?Informed Consent: I have reviewed the patients History and Physical, chart, labs and discussed the procedure including the risks, benefits and alternatives for the proposed anesthesia with the patient or authorized representative who has indicated his/her understanding and acceptance.  ? ?Patient has DNR.  ?Discussed DNR with patient, Discussed DNR with power of attorney and Suspend DNR. ?  ? ? ?Plan  Discussed with: CRNA ? ?Anesthesia Plan Comments: (DNR discussed with patient and her daughter in preop. She would like to suspend DNR periprocedurally. Daiva Huge, MD)  ? ? ? ? ? ?Anesthesia Quick Evaluation ? ?

## 2021-12-06 ENCOUNTER — Encounter (HOSPITAL_COMMUNITY): Payer: Self-pay | Admitting: Gastroenterology

## 2021-12-13 ENCOUNTER — Encounter: Payer: Self-pay | Admitting: Physician Assistant

## 2021-12-13 ENCOUNTER — Encounter: Payer: Self-pay | Admitting: Internal Medicine

## 2021-12-14 ENCOUNTER — Encounter (HOSPITAL_COMMUNITY): Payer: Self-pay | Admitting: Gastroenterology

## 2021-12-14 ENCOUNTER — Encounter (HOSPITAL_COMMUNITY)
Admission: RE | Disposition: A | Discharge status: Home / Self Care | Payer: Self-pay | Source: Home / Self Care | Attending: Gastroenterology

## 2021-12-14 ENCOUNTER — Ambulatory Visit (HOSPITAL_COMMUNITY)
Admission: RE | Admit: 2021-12-14 | Discharge: 2021-12-14 | Disposition: A | Discharge status: Home / Self Care | Payer: Medicare Other | Attending: Gastroenterology | Admitting: Gastroenterology

## 2021-12-14 DIAGNOSIS — K921 Melena: Secondary | ICD-10-CM | POA: Insufficient documentation

## 2021-12-14 HISTORY — PX: GIVENS CAPSULE STUDY: SHX5432

## 2021-12-14 SURGERY — IMAGING PROCEDURE, GI TRACT, INTRALUMINAL, VIA CAPSULE
Anesthesia: LOCAL

## 2021-12-14 SURGICAL SUPPLY — 1 items: TOWEL COTTON PACK 4EA (MISCELLANEOUS) ×4 IMPLANT

## 2021-12-14 NOTE — Progress Notes (Signed)
Patient swallowed pill cam at 0830.  Patient tolerated well.  Instructions reviewed/given to patient and verbalized understanding.  Patient has number to call with any questions.  Patient understands to return tomorrow between 8am to 2pm to return monitor. ?

## 2021-12-15 ENCOUNTER — Encounter (HOSPITAL_COMMUNITY): Payer: Self-pay | Admitting: Gastroenterology

## 2021-12-23 ENCOUNTER — Emergency Department (HOSPITAL_COMMUNITY): Payer: Medicare Other

## 2021-12-23 ENCOUNTER — Encounter (HOSPITAL_COMMUNITY): Payer: Self-pay | Admitting: Emergency Medicine

## 2021-12-23 ENCOUNTER — Inpatient Hospital Stay (HOSPITAL_COMMUNITY)
Admission: EM | Admit: 2021-12-23 | Discharge: 2021-12-27 | DRG: 378 | Disposition: A | Discharge status: Home / Self Care | Payer: Medicare Other | Attending: Internal Medicine | Admitting: Internal Medicine

## 2021-12-23 ENCOUNTER — Other Ambulatory Visit: Payer: Self-pay

## 2021-12-23 DIAGNOSIS — I5032 Chronic diastolic (congestive) heart failure: Secondary | ICD-10-CM | POA: Diagnosis present

## 2021-12-23 DIAGNOSIS — N179 Acute kidney failure, unspecified: Secondary | ICD-10-CM | POA: Diagnosis present

## 2021-12-23 DIAGNOSIS — N183 Chronic kidney disease, stage 3 unspecified: Secondary | ICD-10-CM | POA: Diagnosis present

## 2021-12-23 DIAGNOSIS — Z823 Family history of stroke: Secondary | ICD-10-CM

## 2021-12-23 DIAGNOSIS — I2510 Atherosclerotic heart disease of native coronary artery without angina pectoris: Secondary | ICD-10-CM | POA: Diagnosis present

## 2021-12-23 DIAGNOSIS — Z801 Family history of malignant neoplasm of trachea, bronchus and lung: Secondary | ICD-10-CM

## 2021-12-23 DIAGNOSIS — K922 Gastrointestinal hemorrhage, unspecified: Secondary | ICD-10-CM | POA: Diagnosis present

## 2021-12-23 DIAGNOSIS — Z87891 Personal history of nicotine dependence: Secondary | ICD-10-CM

## 2021-12-23 DIAGNOSIS — K625 Hemorrhage of anus and rectum: Principal | ICD-10-CM

## 2021-12-23 DIAGNOSIS — Z951 Presence of aortocoronary bypass graft: Secondary | ICD-10-CM

## 2021-12-23 DIAGNOSIS — K921 Melena: Principal | ICD-10-CM | POA: Diagnosis present

## 2021-12-23 DIAGNOSIS — Z85118 Personal history of other malignant neoplasm of bronchus and lung: Secondary | ICD-10-CM

## 2021-12-23 DIAGNOSIS — Z7902 Long term (current) use of antithrombotics/antiplatelets: Secondary | ICD-10-CM

## 2021-12-23 DIAGNOSIS — Z8673 Personal history of transient ischemic attack (TIA), and cerebral infarction without residual deficits: Secondary | ICD-10-CM

## 2021-12-23 DIAGNOSIS — I472 Ventricular tachycardia, unspecified: Secondary | ICD-10-CM | POA: Diagnosis not present

## 2021-12-23 DIAGNOSIS — Z90710 Acquired absence of both cervix and uterus: Secondary | ICD-10-CM

## 2021-12-23 DIAGNOSIS — Z888 Allergy status to other drugs, medicaments and biological substances status: Secondary | ICD-10-CM

## 2021-12-23 DIAGNOSIS — Z79899 Other long term (current) drug therapy: Secondary | ICD-10-CM

## 2021-12-23 DIAGNOSIS — Z881 Allergy status to other antibiotic agents status: Secondary | ICD-10-CM

## 2021-12-23 DIAGNOSIS — I739 Peripheral vascular disease, unspecified: Secondary | ICD-10-CM | POA: Diagnosis present

## 2021-12-23 DIAGNOSIS — I252 Old myocardial infarction: Secondary | ICD-10-CM

## 2021-12-23 DIAGNOSIS — Q0700 Arnold-Chiari syndrome without spina bifida or hydrocephalus: Secondary | ICD-10-CM

## 2021-12-23 DIAGNOSIS — Z923 Personal history of irradiation: Secondary | ICD-10-CM

## 2021-12-23 DIAGNOSIS — Z83438 Family history of other disorder of lipoprotein metabolism and other lipidemia: Secondary | ICD-10-CM

## 2021-12-23 DIAGNOSIS — Z825 Family history of asthma and other chronic lower respiratory diseases: Secondary | ICD-10-CM

## 2021-12-23 DIAGNOSIS — D62 Acute posthemorrhagic anemia: Secondary | ICD-10-CM | POA: Diagnosis present

## 2021-12-23 DIAGNOSIS — Z66 Do not resuscitate: Secondary | ICD-10-CM | POA: Diagnosis present

## 2021-12-23 DIAGNOSIS — Z9049 Acquired absence of other specified parts of digestive tract: Secondary | ICD-10-CM

## 2021-12-23 DIAGNOSIS — I130 Hypertensive heart and chronic kidney disease with heart failure and stage 1 through stage 4 chronic kidney disease, or unspecified chronic kidney disease: Secondary | ICD-10-CM | POA: Diagnosis present

## 2021-12-23 DIAGNOSIS — Z947 Corneal transplant status: Secondary | ICD-10-CM

## 2021-12-23 DIAGNOSIS — E785 Hyperlipidemia, unspecified: Secondary | ICD-10-CM | POA: Diagnosis present

## 2021-12-23 DIAGNOSIS — Z832 Family history of diseases of the blood and blood-forming organs and certain disorders involving the immune mechanism: Secondary | ICD-10-CM

## 2021-12-23 DIAGNOSIS — Z885 Allergy status to narcotic agent status: Secondary | ICD-10-CM

## 2021-12-23 DIAGNOSIS — Z955 Presence of coronary angioplasty implant and graft: Secondary | ICD-10-CM

## 2021-12-23 DIAGNOSIS — Z95820 Peripheral vascular angioplasty status with implants and grafts: Secondary | ICD-10-CM

## 2021-12-23 DIAGNOSIS — Z8249 Family history of ischemic heart disease and other diseases of the circulatory system: Secondary | ICD-10-CM

## 2021-12-23 DIAGNOSIS — J449 Chronic obstructive pulmonary disease, unspecified: Secondary | ICD-10-CM | POA: Diagnosis present

## 2021-12-23 DIAGNOSIS — K219 Gastro-esophageal reflux disease without esophagitis: Secondary | ICD-10-CM | POA: Diagnosis present

## 2021-12-23 DIAGNOSIS — Z8619 Personal history of other infectious and parasitic diseases: Secondary | ICD-10-CM

## 2021-12-23 LAB — COMPREHENSIVE METABOLIC PANEL
ALT: 11 U/L (ref 0–44)
AST: 17 U/L (ref 15–41)
Albumin: 3.6 g/dL (ref 3.5–5.0)
Alkaline Phosphatase: 74 U/L (ref 38–126)
Anion gap: 9 (ref 5–15)
BUN: 38 mg/dL — ABNORMAL HIGH (ref 8–23)
CO2: 23 mmol/L (ref 22–32)
Calcium: 9.2 mg/dL (ref 8.9–10.3)
Chloride: 104 mmol/L (ref 98–111)
Creatinine, Ser: 2.05 mg/dL — ABNORMAL HIGH (ref 0.44–1.00)
GFR, Estimated: 24 mL/min — ABNORMAL LOW (ref >60)
Glucose, Bld: 142 mg/dL — ABNORMAL HIGH (ref 70–99)
Potassium: 4.3 mmol/L (ref 3.5–5.1)
Sodium: 136 mmol/L (ref 135–145)
Total Bilirubin: 0.4 mg/dL (ref 0.3–1.2)
Total Protein: 7.2 g/dL (ref 6.5–8.1)

## 2021-12-23 LAB — URINALYSIS, ROUTINE W REFLEX MICROSCOPIC
Bilirubin Urine: NEGATIVE
Glucose, UA: NEGATIVE mg/dL
Hgb urine dipstick: NEGATIVE
Ketones, ur: NEGATIVE mg/dL
Leukocytes,Ua: NEGATIVE
Nitrite: NEGATIVE
Protein, ur: NEGATIVE mg/dL
Specific Gravity, Urine: 1.006 (ref 1.005–1.030)
pH: 6 (ref 5.0–8.0)

## 2021-12-23 LAB — CBC
HCT: 25.6 % — ABNORMAL LOW (ref 36.0–46.0)
Hemoglobin: 8.2 g/dL — ABNORMAL LOW (ref 12.0–15.0)
MCH: 30 pg (ref 26.0–34.0)
MCHC: 32 g/dL (ref 30.0–36.0)
MCV: 93.8 fL (ref 80.0–100.0)
Platelets: 391 10*3/uL (ref 150–400)
RBC: 2.73 MIL/uL — ABNORMAL LOW (ref 3.87–5.11)
RDW: 16 % — ABNORMAL HIGH (ref 11.5–15.5)
WBC: 7.4 10*3/uL (ref 4.0–10.5)
nRBC: 0 % (ref 0.0–0.2)

## 2021-12-23 LAB — POC OCCULT BLOOD, ED: Fecal Occult Bld: POSITIVE — AB

## 2021-12-23 MED ORDER — LACTATED RINGERS IV SOLN: INTRAVENOUS | Status: DC

## 2021-12-23 MED ORDER — FLUTICASONE FUROATE-VILANTEROL 200-25 MCG/ACT IN AEPB
1.0000 | INHALATION_SPRAY | Freq: Every day | RESPIRATORY_TRACT | Status: DC
Start: 2021-12-24 — End: 2021-12-27
  Administered 2021-12-24 – 2021-12-27 (×4): 1 via RESPIRATORY_TRACT
  Filled 2021-12-23: qty 28

## 2021-12-23 MED ORDER — UMECLIDINIUM BROMIDE 62.5 MCG/ACT IN AEPB
1.0000 | INHALATION_SPRAY | Freq: Every day | RESPIRATORY_TRACT | Status: DC
  Administered 2021-12-24 – 2021-12-27 (×4): 1 via RESPIRATORY_TRACT
  Filled 2021-12-23: qty 7

## 2021-12-23 MED ORDER — ONDANSETRON HCL 4 MG PO TABS: 4.0000 mg | ORAL_TABLET | Freq: Four times a day (QID) | ORAL | Status: DC | PRN

## 2021-12-23 MED ORDER — ONDANSETRON HCL 4 MG/2ML IJ SOLN
4.0000 mg | Freq: Four times a day (QID) | INTRAMUSCULAR | Status: DC | PRN
Start: 2021-12-23 — End: 2021-12-27
  Administered 2021-12-24: 4 mg via INTRAVENOUS
  Filled 2021-12-23: qty 2

## 2021-12-23 MED ORDER — LACTATED RINGERS IV BOLUS
1000.0000 mL | Freq: Once | INTRAVENOUS | Status: AC
  Administered 2021-12-23: 1000 mL via INTRAVENOUS

## 2021-12-23 MED ORDER — PANTOPRAZOLE SODIUM 40 MG IV SOLR
40.0000 mg | Freq: Two times a day (BID) | INTRAVENOUS | Status: DC
  Administered 2021-12-23 – 2021-12-27 (×8): 40 mg via INTRAVENOUS
  Filled 2021-12-23 (×8): qty 10

## 2021-12-23 MED ORDER — ACETAMINOPHEN 650 MG RE SUPP: 650.0000 mg | Freq: Four times a day (QID) | RECTAL | Status: DC | PRN

## 2021-12-23 MED ORDER — ACETAMINOPHEN 325 MG PO TABS
650.0000 mg | ORAL_TABLET | Freq: Four times a day (QID) | ORAL | Status: DC | PRN
  Administered 2021-12-23 – 2021-12-26 (×6): 650 mg via ORAL
  Filled 2021-12-23 (×6): qty 2

## 2021-12-23 NOTE — H&P (Signed)
? ?                                                                           [facility] H&P ? ? ? Patient Demographics:  ? ? [name], is a 81 y.o. female  MRN: [id number]  DOB - May 23, 1941 ? ?Admit Date - 12/23/2021 ? ?Referring MD/NP/PA: Vivi Martens ? ?Outpatient Primary MD for the patient is [name], MD ? ?Patient coming from: Home ? ?Chief complaint-dark stools ? ? HPI:  ? ? [name]  is a 81 y.o. female, with medical history of common iliac artery aneurysm, aortoiliac occlusive disease, Arnold-Chiari malformation, asthma/COPD, back bilateral occipital neuralgia, carotid artery stenosis, stage III CKD, history of CAD s/p CABG x3, diverticulosis, GERD, herpes zoster, right eye glaucoma, hyperlipidemia, lung cancer s/p radiation in 2018, peripheral vascular disease with lower extremity stent placement who was recently discharged from the hospital on 12/05/2021 after she was treated for melena.  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  GI recommended to restart taking Plavix and Pletal. ?Patient says that after she started taking Plavix she noticed black stool.  Patient was told by Dr. Benson Norway that if she developed black stools then she should go to the ED.  Patient came to the hospital for further evaluation. ?She denies vomiting blood.  Denies frank blood in stool ?Denies nausea vomiting or diarrhea. ?Denies abdominal pain or dysuria ?Denies chest pain or shortness of breath ?Denies passing out, no dizziness or blurred vision ? ?In the ED hemoglobin was found to be 8.2.  FOBT was positive. ?She was found to be tender in right lower quadrant.  CT abdomen/pelvis was obtained, which showed soft tissue prominence along the inferior aspect of the anus which could be external hemorrhoid.  No other acute abnormalities noted. ? ? ? Review of systems:  ?  ?In addition to the HPI above,  ? ?All  other systems reviewed and are negative. ? ? ? Past History of the following :  ? ? ?Past Medical History:  ?Diagnosis Date  ? Aneurysm of common iliac artery ([place]) 04/2008  ? 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[place])   ? Arnold-Chiari malformation ([place]) 1998  ? Asthma   ? 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  ? Blood in stool   ? last week of aug 2018  ? CAP (community acquired pneumonia) 10/22/2015  ? Chronic kidney disease   ? COPD (chronic obstructive pulmonary disease) ([place])   ? Coronary artery disease   ? 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  ? Diverticulitis   ? Dyspnea   ? with exertion  ? Gastroesophageal reflux disease   ? occ  ? Glaucoma   ? right eye  ? Headache syndrome 11/27/2018  ? Hiatal hernia   ? History of shingles 06/23/2018  ? Hyperlipidemia   ? Hypertension   ? Lung cancer ([place]) dx 2018  ? 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 Myocardial infarction ([place]) 01/01/2000  ? Cardiac catheterization  ? Peripheral vascular disease ([place])   ? stents in legs x 2 or 3  ? Pneumonia   ? last time winter  2017 -2018  ? PONV (postoperative nausea and vomiting)   ? occassionally, last colonscopy did ok with anesthesia  ? Primary cancer of right lower lobe of lung ([place]) 04/25/2016  ? Right-sided carotid artery disease ([place]) 01/07/2013  ? TIA (transient ischemic attack) 02/11/2019  ? Wears dentures   ? Full set  ? Wears glasses   ?   ? ?Past Surgical History:  ?Procedure Laterality Date  ? ABDOMINAL HYSTERECTOMY    ? APPENDECTOMY    ? Arnold-chiari malformation repair  1998  ? Suboccipital craniectomy  ? CAROTID ENDARTERECTOMY  03/29/2010  ? Left  CEA  ? CHOLECYSTECTOMY    ? Gall Bladder  ? COLONOSCOPY WITH PROPOFOL N/A 04/22/2015  ? Procedure: COLONOSCOPY WITH PROPOFOL;  Surgeon: Carol Ada, MD;  Location: [facility];  Service: Endoscopy;  Laterality: N/A;  ? COLONOSCOPY WITH PROPOFOL N/A 05/25/2016  ? Procedure: COLONOSCOPY WITH PROPOFOL;  Surgeon: Carol Ada, MD;  Location: [facility];  Service:  Endoscopy;  Laterality: N/A;  ? COLONOSCOPY WITH PROPOFOL N/A 05/03/2017  ? Procedure: COLONOSCOPY WITH PROPOFOL;  Surgeon: Carol Ada, MD;  Location: [facility];  Service: Endoscopy;  Laterality: N/A;  ? COLONOSCOPY WITH PROPOFOL N/A 06/29/2020  ? Procedure: COLONOSCOPY WITH PROPOFOL;  Surgeon: Carol Ada, MD;  Location: [facility];  Service: Endoscopy;  Laterality: N/A;  ? COLONOSCOPY WITH PROPOFOL N/A 01/05/2021  ? Procedure: COLONOSCOPY WITH PROPOFOL;  Surgeon: Carol Ada, MD;  Location: [facility];  Service: Endoscopy;  Laterality: N/A;  ? COLONOSCOPY WITH PROPOFOL N/A 09/28/2021  ? Procedure: COLONOSCOPY WITH PROPOFOL;  Surgeon: Carol Ada, MD;  Location: [facility];  Service: Endoscopy;  Laterality: N/A;  ? 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 CORONARY ARTERY BYPASS GRAFT  01/01/2000  ? x 3  ? ENDARTERECTOMY Right 09/26/2021  ? Procedure: RIGHT CAROTID ENDARTERECTOMY;  Surgeon: [name], MD;  Location: [facility] OR;  Service: Vascular;  Laterality: Right;  ? ENTEROSCOPY N/A 02/27/2018  ? Procedure: ENTEROSCOPY;  Surgeon: Carol Ada, MD;  [facility];  Service: Endoscopy;  Laterality: N/A;  ? ENTEROSCOPY N/A 07/18/2018  ? Procedure: ENTEROSCOPY;  Surgeon: Carol Ada, MD;  [facility];  Service: Endoscopy;  Laterality: N/A;  ? ENTEROSCOPY N/A 06/29/2020  ? Procedure: ENTEROSCOPY;  Surgeon: Carol Ada, MD;  [facility];  Service: Endoscopy;  Laterality: N/A;  ? ENTEROSCOPY N/A 12/05/2021  ? Procedure: ENTEROSCOPY;  Surgeon: Carol Ada, MD;  Location: [facility];  Service: Gastroenterology;  Laterality: N/A;  ? ESOPHAGOGASTRODUODENOSCOPY N/A 05/25/2016  ? Procedure: ESOPHAGOGASTRODUODENOSCOPY (EGD);  Surgeon: Carol Ada, MD;  Location: [facility];  Service: Endoscopy;  Laterality: N/A;  ? ESOPHAGOGASTRODUODENOSCOPY N/A 08/01/2018  ? Procedure: ESOPHAGOGASTRODUODENOSCOPY (EGD);  Surgeon: Carol Ada, MD;  Location: [facility];  Service:  Endoscopy;  Laterality: N/A;  ? ESOPHAGOGASTRODUODENOSCOPY (EGD) WITH PROPOFOL N/A 09/28/2021  ? Procedure: ESOPHAGOGASTRODUODENOSCOPY (EGD) WITH PROPOFOL;  Surgeon: Carol Ada, MD;  Location: [place];  Service: Endoscopy;  Laterality: N/A;  ? EYE SURGERY Right 1995 or 1996  ? Laser surgery for retinal hemorrhage  ? GIVENS CAPSULE STUDY N/A 07/16/2018  ? Procedure: GIVENS CAPSULE STUDY;  Surgeon: Carol Ada, MD;  [facility];  Service: Endoscopy;  Laterality: N/A;  ? GIVENS CAPSULE STUDY N/A 12/14/2021  ? Procedure: GIVENS CAPSULE STUDY;  Surgeon: Carol Ada, MD;  Location: [facility];  Service: Gastroenterology;  Laterality: N/A;  ? HEMOSTASIS CLIP PLACEMENT  06/29/2020  ? Procedure: HEMOSTASIS CLIP PLACEMENT;  Surgeon: Carol Ada, MD;  Location: [facility];  Service: Endoscopy;;  ? HEMOSTASIS CLIP PLACEMENT  01/05/2021  ? Procedure: HEMOSTASIS CLIP PLACEMENT;  Surgeon: Carol Ada, MD;  [facility];  Service: Endoscopy;;  ? HOT HEMOSTASIS N/A 02/27/2018  ? Procedure: HOT HEMOSTASIS (ARGON PLASMA COAGULATION/BICAP);  Surgeon: Carol Ada, MD;  Location: [facility];  Service: Endoscopy;  Laterality: N/A;  ? HOT HEMOSTASIS N/A 08/01/2018  ? Procedure: HOT HEMOSTASIS (ARGON PLASMA COAGULATION/BICAP);  Surgeon: Carol Ada, MD;  Location: [facility];  Service: Endoscopy;  Laterality: N/A;  ? HOT HEMOSTASIS N/A 06/29/2020  ? Procedure: HOT HEMOSTASIS (ARGON PLASMA COAGULATION/BICAP);  Surgeon: Carol Ada, MD;  Location: [facility];  Service: Endoscopy;  Laterality: N/A;  ? HOT HEMOSTASIS N/A 01/05/2021  ? Procedure: HOT HEMOSTASIS (ARGON PLASMA COAGULATION/BICAP);  Surgeon: Carol Ada, MD;  Location: [facility];  Service: Endoscopy;  Laterality: N/A;  ? HOT HEMOSTASIS N/A 12/05/2021  ? Procedure: HOT HEMOSTASIS (ARGON PLASMA COAGULATION/BICAP);  Surgeon: Carol Ada, MD;  Location: [facility];  Service: Gastroenterology;  Laterality: N/A;  ? IR RADIOLOGIST EVAL  & MGMT  12/14/2016  ? IR RADIOLOGIST EVAL & MGMT  07/26/2021  ? LEFT HEART CATH AND CORS/GRAFTS ANGIOGRAPHY N/A 01/09/2018  ? Procedure: LEFT HEART CATH AND CORS/GRAFTS ANGIOGRAPHY;  Surgeon: Charolette Forward, MD;  Location

## 2021-12-23 NOTE — ED Triage Notes (Signed)
Patient reports losing blood through her bowls, has had a hx of needing transfusions for this issue. Started yesterday, also reports a HA. Reports generalized weakness, always SOB d/t lung cancer. Pt was on plavix, last dose yesterday.  ?

## 2021-12-23 NOTE — ED Provider Triage Note (Signed)
Emergency Medicine Provider Triage Evaluation Note ? ?[name] , a 81 y.o. female  was evaluated in triage.  Pt complains of melena.  Patient states she has had melanotic stools since last night.  She states that she has had multiple black tarry stools and diarrhea.  She is also had associated nausea.  She has felt generalized weakness throughout the day.  She recently was admitted for an upper GI bleed and has had multiple endoscopies with intervention.  She apparently stopped having melanotic stools prior to discharge and was doing well.  She restarted her Plavix last Friday, and when she talked her gastroenterologist with yesterday about her symptoms, he is told her to stop taking this medication. Not on any other blood thinners. ?Review of Systems  ?Positive: Melena, diarrhea, generalized weakness ?Negative:  ? ?Physical Exam  ?BP (!) 112/45 (BP Location: Right Arm)   Pulse 78   Temp 98.1 ?F (36.7 ?C) (Oral)   Resp 18   Ht 5\' 2"  (1.575 m)   Wt 67.6 kg   SpO2 100%   BMI 27.25 kg/m?  ?Gen:   Awake, no distress   ?Resp:  Normal effort  ?MSK:   Moves extremities without difficulty  ?Other:   ? ?Medical Decision Making  ?Medically screening exam initiated at 12:30 PM.  Appropriate orders placed.  [name] was informed that the remainder of the evaluation will be completed by another provider, this initial triage assessment does not replace that evaluation, and the importance of remaining in the ED until their evaluation is complete. ? ?Concern for upper GI bleed ?  ?Adolphus Birchwood, PA-C ?12/23/21 1232 ? ?

## 2021-12-23 NOTE — ED Provider Notes (Signed)
?[facility] DEPT ?Provider Note ? ? ?CSN: [id number] ?Arrival date & time: 12/23/21  1131 ? ?  ? ?History ? ?Chief Complaint  ?Patient presents with  ? Rectal Bleeding  ? ? ?[name] is a 81 y.o. female. ? ?81 year old female presents with dark stools times several days.  Patient recently started on Plavix about 6 days ago.  Has had some right lower quadrant pain without emesis.  Denies any blood clots in her stool.  No fever or chills.  History of similar symptoms in the past and is followed by GI.  Patient's blood her GI doctor, Dr. Benson Norway, yesterday who told her to come to the hospital if she started for worse.  Patient endorses dyspnea on exertion as well as increased weakness. ? ? ?  ? ?Home Medications ?Prior to Admission medications   ?Medication Sig Start Date End Date Taking? Authorizing Provider  ?acetaminophen (TYLENOL) 500 MG tablet Take 500-1,000 mg by mouth every 6 (six) hours as needed (for pain).    [provider]  ?albuterol (VENTOLIN HFA) 108 (90 Base) MCG/ACT inhaler Inhale 2 puffs into the lungs every 6 (six) hours as needed for wheezing or shortness of breath. 05/25/20   Garner Nash, DO  ?atorvastatin (LIPITOR) 40 MG tablet Take 1 tablet (40 mg total) by mouth at bedtime. 09/29/21   Rhyne, Hulen Shouts, PA-C  ?cetirizine (ZYRTEC) 10 MG tablet Take 10 mg by mouth daily as needed for allergies.     [provider]  ?clopidogrel (PLAVIX) 75 MG tablet Take 1 tablet (75 mg total) by mouth daily. ?Patient taking differently: Take 75 mg by mouth in the morning. 07/08/20   Mercy Riding, MD  ?dexlansoprazole (DEXILANT) 60 MG capsule Take 1 capsule (60 mg total) by mouth daily before breakfast. 11/17/12   Tanda Rockers, MD  ?Fluticasone-Umeclidin-Vilant (TRELEGY ELLIPTA) 200-62.5-25 MCG/ACT AEPB Inhale 1 puff into the lungs daily. ?Patient taking differently: Inhale 1 puff into the lungs at bedtime. 06/15/21   Garner Nash, DO  ?furosemide (LASIX)  40 MG tablet Take 40 mg by mouth in the morning. 01/30/21   [provider]  ?Magnesium Oxide 400 MG CAPS Take 400 mg by mouth in the morning.    [provider]  ?nitroGLYCERIN (NITRODUR - DOSED IN MG/24 HR) 0.2 mg/hr patch Place 0.2 mg onto the skin See admin instructions. Apply 1 patch to the skin every morning and remove after 12 hours    [provider]  ?nitroGLYCERIN (NITROSTAT) 0.4 MG SL tablet Place 0.4 mg under the tongue every 5 (five) minutes x 3 doses as needed for chest pain.    [provider]  ?olmesartan (BENICAR) 20 MG tablet Take 1 tablet (20 mg total) by mouth daily. ?Patient taking differently: Take 20 mg by mouth in the morning. 07/04/20   Mercy Riding, MD  ?ondansetron (ZOFRAN-ODT) 4 MG disintegrating tablet Take 4 mg by mouth every 8 (eight) hours as needed for nausea or vomiting (dissolve in the mouth). 03/29/21   [provider]  ?   ? ?Allergies    ?Avelox [moxifloxacin hcl in nacl], Azithromycin, Codeine, Doxycycline, Hydromorphone, Levaquin [levofloxacin], Vitamin d analogs, Nifedipine er, Oxycodone-acetaminophen, and Risedronate   ? ?Review of Systems   ?Review of Systems  ?All other systems reviewed and are negative. ? ?Physical Exam ?Updated Vital Signs ?BP (!) 112/45 (BP Location: Right Arm)   Pulse 78   Temp 98.1 ?F (36.7 ?C) (Oral)  Resp 18   Ht 1.575 m (5\' 2" )   Wt 67.6 kg   SpO2 100%   BMI 27.25 kg/m?  ?Physical Exam ?Vitals and nursing note reviewed.  ?Constitutional:   ?   General: She is not in acute distress. ?   Appearance: Normal appearance. She is well-developed. She is not toxic-appearing.  ?HENT:  ?   Head: Normocephalic and atraumatic.  ?Eyes:  ?   General: Lids are normal.  ?   Conjunctiva/sclera: Conjunctivae normal.  ?   Pupils: Pupils are equal, round, and reactive to light.  ?Neck:  ?   Thyroid: No thyroid mass.  ?   Trachea: No tracheal deviation.  ?Cardiovascular:  ?   Rate and Rhythm: Normal rate and regular  rhythm.  ?   Heart sounds: Normal heart sounds. No murmur heard. ?  No gallop.  ?Pulmonary:  ?   Effort: Pulmonary effort is normal. No respiratory distress.  ?   Breath sounds: Normal breath sounds. No stridor. No decreased breath sounds, wheezing, rhonchi or rales.  ?Abdominal:  ?   General: There is no distension.  ?   Palpations: Abdomen is soft.  ?   Tenderness: There is abdominal tenderness in the right lower quadrant. There is guarding. There is no rebound.  ? ? ?Musculoskeletal:     ?   General: No tenderness. Normal range of motion.  ?   Cervical back: Normal range of motion and neck supple.  ?Skin: ?   General: Skin is warm and dry.  ?   Findings: No abrasion or rash.  ?Neurological:  ?   Mental Status: She is alert and oriented to person, place, and time. Mental status is at baseline.  ?   GCS: GCS eye subscore is 4. GCS verbal subscore is 5. GCS motor subscore is 6.  ?   Cranial Nerves: No cranial nerve deficit.  ?   Sensory: No sensory deficit.  ?   Motor: Motor function is intact.  ?Psychiatric:     ?   Attention and Perception: Attention normal.     ?   Speech: Speech normal.     ?   Behavior: Behavior normal.  ? ? ?ED Results / Procedures / Treatments   ?Labs ?(all labs ordered are listed, but only abnormal results are displayed) ?Labs Reviewed  ?COMPREHENSIVE METABOLIC PANEL - Abnormal; Notable for the following components:  ?    Result Value  ? Glucose, Bld 142 (*)   ? BUN 38 (*)   ? Creatinine, Ser 2.05 (*)   ? GFR, Estimated 24 (*)   ? All other components within normal limits  ?CBC - Abnormal; Notable for the following components:  ? RBC 2.73 (*)   ? Hemoglobin 8.2 (*)   ? HCT 25.6 (*)   ? RDW 16.0 (*)   ? All other components within normal limits  ?POC OCCULT BLOOD, ED  ?TYPE AND SCREEN  ? ? ?EKG ?None ? ?Radiology ?No results found. ? ?Procedures ?Procedures  ? ? ?Medications Ordered in ED ?Medications - No data to display ? ?ED Course/ Medical Decision Making/ A&P ?Clinical Course as of  12/23/21 1549  ?Sat Dec 23, 2021  ?1253 Creatinine(!): 2.05 ?New AKI [GL]  ?1253 Hemoglobin(!): 8.2 ?Hgb stable [GL]  ?  ?Clinical Course User Index ?[GL] Loeffler, Adora Fridge, PA-C  ? ?                        ?  Medical Decision Making ?Amount and/or Complexity of Data Reviewed ?Labs: ordered. ?Radiology: ordered. ? ?Risk ?Prescription drug management. ? ? ?Patient stools guaiac positive here.  Patient likely with upper GI source as etiology of this.  Has evidence of acute kidney injury with fattening of 2.05.  Patient's hemoglobin slightly decreased at 8.2.  Suspect that she is hemoconcentrated.  Has been given IV fluids here.  Has been typed and screened.  Patient experiencing abdominal pain.  Considered etiologies such as perforated viscus and abdominal CT was negative per my interpretation.  Plan will be for patient to be admitted to the hospital with GI consultation.  I will send a secure chat to GI person on call.  Will speak with hospitalist for admission.  Discussed with patient and family who are agreeable to this.  Consider discharge but feel that patient is at high risk for decompensation as she is on Plavix and she could suffer a massive GI bleed. ? ? ? ? ? ? ?Final Clinical Impression(s) / ED Diagnoses ?Final diagnoses:  ?None  ? ? ?Rx / DC Orders ?ED Discharge Orders   ? ? None  ? ?  ? ? ?  ?Lacretia Leigh, MD ?12/23/21 1634 ? ?

## 2021-12-24 ENCOUNTER — Encounter (HOSPITAL_COMMUNITY): Payer: Self-pay | Admitting: Family Medicine

## 2021-12-24 DIAGNOSIS — D62 Acute posthemorrhagic anemia: Secondary | ICD-10-CM

## 2021-12-24 DIAGNOSIS — Z8619 Personal history of other infectious and parasitic diseases: Secondary | ICD-10-CM | POA: Diagnosis not present

## 2021-12-24 DIAGNOSIS — Z951 Presence of aortocoronary bypass graft: Secondary | ICD-10-CM | POA: Diagnosis not present

## 2021-12-24 DIAGNOSIS — K219 Gastro-esophageal reflux disease without esophagitis: Secondary | ICD-10-CM | POA: Diagnosis present

## 2021-12-24 DIAGNOSIS — K625 Hemorrhage of anus and rectum: Secondary | ICD-10-CM | POA: Diagnosis present

## 2021-12-24 DIAGNOSIS — I2510 Atherosclerotic heart disease of native coronary artery without angina pectoris: Secondary | ICD-10-CM | POA: Diagnosis present

## 2021-12-24 DIAGNOSIS — I509 Heart failure, unspecified: Secondary | ICD-10-CM | POA: Diagnosis not present

## 2021-12-24 DIAGNOSIS — K921 Melena: Secondary | ICD-10-CM | POA: Diagnosis present

## 2021-12-24 DIAGNOSIS — I472 Ventricular tachycardia, unspecified: Secondary | ICD-10-CM | POA: Diagnosis not present

## 2021-12-24 DIAGNOSIS — Z955 Presence of coronary angioplasty implant and graft: Secondary | ICD-10-CM | POA: Diagnosis not present

## 2021-12-24 DIAGNOSIS — I5032 Chronic diastolic (congestive) heart failure: Secondary | ICD-10-CM | POA: Diagnosis present

## 2021-12-24 DIAGNOSIS — D500 Iron deficiency anemia secondary to blood loss (chronic): Secondary | ICD-10-CM | POA: Diagnosis not present

## 2021-12-24 DIAGNOSIS — Z9049 Acquired absence of other specified parts of digestive tract: Secondary | ICD-10-CM | POA: Diagnosis not present

## 2021-12-24 DIAGNOSIS — N183 Chronic kidney disease, stage 3 unspecified: Secondary | ICD-10-CM | POA: Diagnosis present

## 2021-12-24 DIAGNOSIS — K5521 Angiodysplasia of colon with hemorrhage: Secondary | ICD-10-CM

## 2021-12-24 DIAGNOSIS — I110 Hypertensive heart disease with heart failure: Secondary | ICD-10-CM | POA: Diagnosis not present

## 2021-12-24 DIAGNOSIS — Z923 Personal history of irradiation: Secondary | ICD-10-CM | POA: Diagnosis not present

## 2021-12-24 DIAGNOSIS — I739 Peripheral vascular disease, unspecified: Secondary | ICD-10-CM | POA: Diagnosis present

## 2021-12-24 DIAGNOSIS — Z85118 Personal history of other malignant neoplasm of bronchus and lung: Secondary | ICD-10-CM | POA: Diagnosis not present

## 2021-12-24 DIAGNOSIS — N179 Acute kidney failure, unspecified: Secondary | ICD-10-CM | POA: Diagnosis present

## 2021-12-24 DIAGNOSIS — Q0700 Arnold-Chiari syndrome without spina bifida or hydrocephalus: Secondary | ICD-10-CM | POA: Diagnosis not present

## 2021-12-24 DIAGNOSIS — Z90710 Acquired absence of both cervix and uterus: Secondary | ICD-10-CM | POA: Diagnosis not present

## 2021-12-24 DIAGNOSIS — K922 Gastrointestinal hemorrhage, unspecified: Secondary | ICD-10-CM | POA: Diagnosis not present

## 2021-12-24 DIAGNOSIS — I130 Hypertensive heart and chronic kidney disease with heart failure and stage 1 through stage 4 chronic kidney disease, or unspecified chronic kidney disease: Secondary | ICD-10-CM | POA: Diagnosis present

## 2021-12-24 DIAGNOSIS — Z95820 Peripheral vascular angioplasty status with implants and grafts: Secondary | ICD-10-CM | POA: Diagnosis not present

## 2021-12-24 DIAGNOSIS — I252 Old myocardial infarction: Secondary | ICD-10-CM | POA: Diagnosis not present

## 2021-12-24 DIAGNOSIS — Z66 Do not resuscitate: Secondary | ICD-10-CM | POA: Diagnosis present

## 2021-12-24 DIAGNOSIS — E785 Hyperlipidemia, unspecified: Secondary | ICD-10-CM | POA: Diagnosis present

## 2021-12-24 DIAGNOSIS — Z8673 Personal history of transient ischemic attack (TIA), and cerebral infarction without residual deficits: Secondary | ICD-10-CM | POA: Diagnosis not present

## 2021-12-24 DIAGNOSIS — J449 Chronic obstructive pulmonary disease, unspecified: Secondary | ICD-10-CM | POA: Diagnosis present

## 2021-12-24 LAB — CBC
HCT: 21.4 % — ABNORMAL LOW (ref 36.0–46.0)
Hemoglobin: 6.7 g/dL — CL (ref 12.0–15.0)
MCH: 29.8 pg (ref 26.0–34.0)
MCHC: 31.3 g/dL (ref 30.0–36.0)
MCV: 95.1 fL (ref 80.0–100.0)
Platelets: 302 10*3/uL (ref 150–400)
RBC: 2.25 MIL/uL — ABNORMAL LOW (ref 3.87–5.11)
RDW: 15.6 % — ABNORMAL HIGH (ref 11.5–15.5)
WBC: 4.8 10*3/uL (ref 4.0–10.5)
nRBC: 0 % (ref 0.0–0.2)

## 2021-12-24 LAB — COMPREHENSIVE METABOLIC PANEL
ALT: 10 U/L (ref 0–44)
AST: 15 U/L (ref 15–41)
Albumin: 3.1 g/dL — ABNORMAL LOW (ref 3.5–5.0)
Alkaline Phosphatase: 65 U/L (ref 38–126)
Anion gap: 7 (ref 5–15)
BUN: 33 mg/dL — ABNORMAL HIGH (ref 8–23)
CO2: 26 mmol/L (ref 22–32)
Calcium: 9.1 mg/dL (ref 8.9–10.3)
Chloride: 109 mmol/L (ref 98–111)
Creatinine, Ser: 1.77 mg/dL — ABNORMAL HIGH (ref 0.44–1.00)
GFR, Estimated: 29 mL/min — ABNORMAL LOW (ref >60)
Glucose, Bld: 95 mg/dL (ref 70–99)
Potassium: 4.2 mmol/L (ref 3.5–5.1)
Sodium: 142 mmol/L (ref 135–145)
Total Bilirubin: 0.5 mg/dL (ref 0.3–1.2)
Total Protein: 6.1 g/dL — ABNORMAL LOW (ref 6.5–8.1)

## 2021-12-24 LAB — HEMOGLOBIN AND HEMATOCRIT, BLOOD
HCT: 26.5 % — ABNORMAL LOW (ref 36.0–46.0)
Hemoglobin: 8.7 g/dL — ABNORMAL LOW (ref 12.0–15.0)

## 2021-12-24 LAB — PREPARE RBC (CROSSMATCH)

## 2021-12-24 MED ORDER — HYDRALAZINE HCL 25 MG PO TABS
25.0000 mg | ORAL_TABLET | Freq: Four times a day (QID) | ORAL | Status: DC | PRN
  Administered 2021-12-24: 25 mg via ORAL
  Filled 2021-12-24: qty 1

## 2021-12-24 MED ORDER — ALUM & MAG HYDROXIDE-SIMETH 200-200-20 MG/5ML PO SUSP
15.0000 mL | ORAL | Status: DC | PRN
  Administered 2021-12-24: 15 mL via ORAL
  Filled 2021-12-24: qty 30

## 2021-12-24 MED ORDER — OCTREOTIDE ACETATE 50 MCG/ML IJ SOLN
50.0000 ug | Freq: Three times a day (TID) | INTRAMUSCULAR | Status: DC
  Administered 2021-12-24: 50 ug via SUBCUTANEOUS
  Filled 2021-12-24 (×10): qty 1

## 2021-12-24 MED ORDER — SODIUM CHLORIDE 0.9% IV SOLUTION: Freq: Once | INTRAVENOUS | Status: AC

## 2021-12-24 NOTE — Progress Notes (Signed)
[facility] ? ?PROGRESS NOTE ? ?[name]:[id number] DOB: 10/09/1940 DOA: 12/23/2021 ?PCP: [name], MD ? ? ?Brief HPI:   ? ?81 y.o. female, with medical history of common iliac artery aneurysm, aortoiliac occlusive disease, Arnold-Chiari malformation, asthma/COPD, back bilateral occipital neuralgia, carotid artery stenosis, stage III CKD, history of CAD s/p CABG x3, diverticulosis, GERD, herpes zoster, right eye glaucoma, hyperlipidemia, lung cancer s/p radiation in 2018, peripheral vascular disease with lower extremity stent placement who was recently discharged from the hospital on 12/05/2021 after she was treated for melena.  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  GI recommended to restart taking Plavix and Pletal. ?Patient says that after she started taking Plavix she noticed black stool.  Patient was told by Dr. Benson Norway that if she developed black stools then she should go to the ED.  Patient came to the hospital for further evaluation. ? ? ?Subjective  ? ?Patient seen and examined, hemoglobin dropped to 6.7 this morning. ? ? Assessment/Plan:  ? ? ?GI bleed ?-FOBT positive ?-Gastroenterology consulted, plan for small bowel enteroscopy/capsule study to check for AVMs in small bowel ?-We will start octreotide 50 mcg subcu 3 times daily as per GI recommendation ?-Continue Protonix 40 mg IV every 12 hours ? ?Anemia, due to above ?-Hemoglobin is down to 6.7 ?-We will transfuse 1 unit PRBC ?-Follow CBC in a.m. ? ?Acute kidney injury ?-Creatinine has improved to 1.77 ?-We will discontinue IV fluids as patient is good to get blood transfusion as above ?-Follow BMP in am ? ?Chronic diastolic CHF ?-Echocardiogram showed EF of 60 to 65% with grade 1 diastolic dysfunction ?-Patient is euvolemic at this time ?-Lasix and irbesartan on hold due to acute kidney injury ? ?S/p carotid  endarterectomy ?-S/p right carotid endarterectomy with bovine pericardial patch angioplasty on 09/26/2021.  Plavix/Pletal currently on hold due to GI bleed. ? ?Peripheral vascular disease ?-Hold Pletal, Plavix, Lipitor ? ?Hypertension ?-Benicar on hold ? ?History of TIA ?-Plavix on hold ? ?COPD ?-Not in exacerbation ? ?Medications ? ?  ? fluticasone furoate-vilanterol  1 puff Inhalation Daily  ? And  ? umeclidinium bromide  1 puff Inhalation Daily  ? octreotide  50 mcg Subcutaneous Q8H  ? pantoprazole (PROTONIX) IV  40 mg Intravenous Q12H  ? ? ? Data Reviewed:  ? ?CBG: ? ?No results for input(s): GLUCAP in the last 168 hours. ? ?SpO2: 97 %  ? ? ?Vitals:  ? 12/24/21 0002 12/24/21 0406 12/24/21 0742 12/24/21 0950  ?BP: (!) 128/34 (!) 118/45  (!) 143/62  ?Pulse: (!) 56 (!) 52  (!) 59  ?Resp: 14 14  16   ?Temp: 97.8 ?F (36.6 ?C) 97.7 ?F (36.5 ?C)  (!) 97.5 ?F (36.4 ?C)  ?TempSrc: Oral Oral  Oral  ?SpO2: 97% 96% 94% 97%  ?Weight:      ?Height:      ? ? ? ? ?Data Reviewed: ? ?Basic Metabolic Panel: ?Recent Labs  ?Lab 12/23/21 ?1214 12/24/21 ?0559  ?NA 136 142  ?K 4.3 4.2  ?CL 104 109  ?CO2 23 26  ?GLUCOSE 142* 95  ?BUN 38* 33*  ?CREATININE 2.05* 1.77*  ?CALCIUM 9.2 9.1  ? ? ?CBC: ?Recent Labs  ?Lab 12/23/21 ?1214 12/24/21 ?0559  ?WBC 7.4 4.8  ?HGB 8.2* 6.7*  ?HCT 25.6* 21.4*  ?MCV 93.8 95.1  ?PLT 391 302  ? ? ?LFT ?Recent Labs  ?Lab 12/23/21 ?  1214 12/24/21 ?0559  ?AST 17 15  ?ALT 11 10  ?ALKPHOS 74 65  ?BILITOT 0.4 0.5  ?PROT 7.2 6.1*  ?ALBUMIN 3.6 3.1*  ? ?  ?Antibiotics: ?Anti-infectives (From admission, onward)  ? ? None  ? ?  ? ? ? ?DVT prophylaxis: SCDs ? ?Code Status: Full code ? ?Family Communication: No family at bedside ? ? ?CONSULTS gastroenterology ? ? ?Objective  ? ? ?Physical Examination: ? ? ?General-appears in no acute distress ?Heart-S1-S2, regular, no murmur auscultated ?Lungs-clear to auscultation bilaterally, no wheezing or crackles auscultated ?Abdomen-soft, nontender, no organomegaly ?Extremities-no  edema in the lower extremities ?Neuro-alert, oriented x3, no focal deficit noted ? ?Status is: Inpatient:   ? ? ?  ? ? ?[name] ?  ?[facility] ?If 7PM-7AM, please contact night-coverage at [email or web address], ?Office  [phone] ? ? ?12/24/2021, 3:45 PM  LOS: 0 days  ? ? ? ? ? ? ? ? ? ? ?  ?

## 2021-12-24 NOTE — Consult Note (Signed)
[facility] Consult Note ? ? ?History ?[name] ?MRN # [id number] ? ?Date of Admission: 12/23/2021 ?Date of Consultation: 12/24/2021 ?Referring physician: [name], MD ?Primary Care Provider: [name], MD ?Primary Gastroenterologist: [name] ? ? ?Reason for Consultation/Chief Complaint: Melena and acute on chronic anemia ? ?Subjective  ?HPI: ? ?This is a 81 year old woman with multiple medical issues as outlined below, well-known to [name] including with a recent admission for recurrent melena and acute on chronic blood loss anemia.  She is known to have recurrent small bowel AVMs with multiple endoscopic procedures for ablation, most recently during hospitalization about 3 weeks ago, at which time nonbleeding jejunal AVMs were treated with APC. ?She then had an outpatient video capsule study on May 4, but the report indicates no small bowel visualization because the capsule was retained in the stomach.  Recommendation from the report indicates repeat attempted study if patient has recurrent melena and anemia. ? ?She came to the ED and was admitted yesterday for recurrent melena and anemia with a hemoglobin of 8.2, down from 8.9 on 12/05/2021.  Her hemoglobin dropped to 6.7 this morning, she remains alert and conversational with no chest pain or dyspnea, and she is hemodynamically stable.  She has some chronic right-sided abdominal discomfort that has persisted.  She has been seen by internal medicine service and I was notified of this consult about an hour ago.  PRBC transfusion is planned, and I discussed the case with [name] of the [facility] ? ?ROS: ? ?Constitutional: Fatigue, weight stable ?Arthralgias ? ?All other systems are negative except as noted above in the HPI ? ?Past Medical History ?Past Medical History:  ?Diagnosis Date  ? 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 Asthma    ? 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  ? Blood in stool   ? last week of aug 2018  ? CAP (community acquired pneumonia) 10/22/2015  ? Chronic kidney disease   ? COPD (chronic obstructive pulmonary disease) ([place])   ? Coronary artery disease   ? 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  ? Diverticulitis   ? Dyspnea   ? with exertion  ? Gastroesophageal reflux disease   ? occ  ? Glaucoma   ? right eye  ? Headache syndrome 11/27/2018  ? Hiatal hernia   ? History of shingles 06/23/2018  ? Hyperlipidemia   ? Hypertension   ? Lung cancer ([place]) dx 2018  ? 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 Myocardial infarction ([place]) 01/01/2000  ? Cardiac catheterization  ? Peripheral vascular disease ([place])   ? stents in legs x 2 or 3  ? Pneumonia   ? last time winter 2017 -2018  ? PONV (postoperative nausea and vomiting)   ? occassionally, last colonscopy did ok with anesthesia  ? Primary cancer of right lower lobe of lung ([place]) 04/25/2016  ? Right-sided carotid artery disease ([place]) 01/07/2013  ? TIA (transient ischemic attack) 02/11/2019  ? Wears dentures   ? Full set  ? Wears glasses   ? ? ?Past Surgical History ?Past Surgical History:  ?Procedure Laterality Date  ? ABDOMINAL HYSTERECTOMY    ? APPENDECTOMY    ? Arnold-chiari malformation repair  1998  ? Suboccipital craniectomy  ? CAROTID ENDARTERECTOMY  03/29/2010  ? Left  CEA  ? CHOLECYSTECTOMY    ? Gall Bladder  ? COLONOSCOPY WITH PROPOFOL N/A 04/22/2015  ?  Procedure: COLONOSCOPY WITH PROPOFOL;  Surgeon: Carol Ada, MD;  [facility];  Service: Endoscopy;  Laterality: N/A;  ? COLONOSCOPY WITH PROPOFOL N/A 05/25/2016  ? Procedure: COLONOSCOPY WITH PROPOFOL;  Surgeon: Carol Ada, MD;  [facility];  Service: Endoscopy;  Laterality: N/A;  ? COLONOSCOPY WITH PROPOFOL N/A 05/03/2017  ? Procedure: COLONOSCOPY WITH PROPOFOL;  Surgeon: Carol Ada, MD;  [facility];  Service: Endoscopy;  Laterality: N/A;  ? COLONOSCOPY WITH PROPOFOL N/A 06/29/2020   ? Procedure: COLONOSCOPY WITH PROPOFOL;  Surgeon: Carol Ada, MD;  [facility];  Service: Endoscopy;  Laterality: N/A;  ? COLONOSCOPY WITH PROPOFOL N/A 01/05/2021  ? Procedure: COLONOSCOPY WITH PROPOFOL;  Surgeon: Carol Ada, MD;  [facility];  Service: Endoscopy;  Laterality: N/A;  ? COLONOSCOPY WITH PROPOFOL N/A 09/28/2021  ? Procedure: COLONOSCOPY WITH PROPOFOL;  Surgeon: Carol Ada, MD;  Location: [place];  Service: Endoscopy;  Laterality: N/A;  ? 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 CORONARY ARTERY BYPASS GRAFT  01/01/2000  ? x 3  ? ENDARTERECTOMY Right 09/26/2021  ? Procedure: RIGHT CAROTID ENDARTERECTOMY;  Surgeon: [name], MD;  Location: [facility];  Service: Vascular;  Laterality: Right;  ? ENTEROSCOPY N/A 02/27/2018  ? Procedure: ENTEROSCOPY;  Surgeon: Carol Ada, MD;  [facility];  Service: Endoscopy;  Laterality: N/A;  ? ENTEROSCOPY N/A 07/18/2018  ? Procedure: ENTEROSCOPY;  Surgeon: Carol Ada, MD;  [facility];  Service: Endoscopy;  Laterality: N/A;  ? ENTEROSCOPY N/A 06/29/2020  ? Procedure: ENTEROSCOPY;  Surgeon: Carol Ada, MD;  [facility];  Service: Endoscopy;  Laterality: N/A;  ? ENTEROSCOPY N/A 12/05/2021  ? Procedure: ENTEROSCOPY;  Surgeon: Carol Ada, MD;  Location: [facility];  Service: Gastroenterology;  Laterality: N/A;  ? ESOPHAGOGASTRODUODENOSCOPY N/A 05/25/2016  ? Procedure: ESOPHAGOGASTRODUODENOSCOPY (EGD);  Surgeon: Carol Ada, MD;  Location: [facility];  Service: Endoscopy;  Laterality: N/A;  ? ESOPHAGOGASTRODUODENOSCOPY N/A 08/01/2018  ? Procedure: ESOPHAGOGASTRODUODENOSCOPY (EGD);  Surgeon: Carol Ada, MD;  Location: [facility];  Service: Endoscopy;  Laterality: N/A;  ? ESOPHAGOGASTRODUODENOSCOPY (EGD) WITH PROPOFOL N/A 09/28/2021  ? Procedure: ESOPHAGOGASTRODUODENOSCOPY (EGD) WITH PROPOFOL;  Surgeon: Carol Ada, MD;  Location: [place];  Service: Endoscopy;  Laterality: N/A;  ? EYE  SURGERY Right 1995 or 1996  ? Laser surgery for retinal hemorrhage  ? GIVENS CAPSULE STUDY N/A 07/16/2018  ? Procedure: GIVENS CAPSULE STUDY;  Surgeon: Carol Ada, MD;  [facility];  Service: Endoscopy;  Laterality: N/A;  ? GIVENS CAPSULE STUDY N/A 12/14/2021  ? Procedure: GIVENS CAPSULE STUDY;  Surgeon: Carol Ada, MD;  Location: [place];  Service: Gastroenterology;  Laterality: N/A;  ? HEMOSTASIS CLIP PLACEMENT  06/29/2020  ? Procedure: HEMOSTASIS CLIP PLACEMENT;  Surgeon: Carol Ada, MD;  [facility];  Service: Endoscopy;;  ? HEMOSTASIS CLIP PLACEMENT  01/05/2021  ? Procedure: HEMOSTASIS CLIP PLACEMENT;  Surgeon: Carol Ada, MD;  [facility];  Service: Endoscopy;;  ? HOT HEMOSTASIS N/A 02/27/2018  ? Procedure: HOT HEMOSTASIS (ARGON PLASMA COAGULATION/BICAP);  Surgeon: Carol Ada, MD;  Location: [facility];  Service: Endoscopy;  Laterality: N/A;  ? HOT HEMOSTASIS N/A 08/01/2018  ? Procedure: HOT HEMOSTASIS (ARGON PLASMA COAGULATION/BICAP);  Surgeon: Carol Ada, MD;  Location: [facility];  Service: Endoscopy;  Laterality: N/A;  ? HOT HEMOSTASIS N/A 06/29/2020  ? Procedure: HOT HEMOSTASIS (ARGON PLASMA COAGULATION/BICAP);  Surgeon: Carol Ada, MD;  Location: [facility];  Service: Endoscopy;  Laterality: N/A;  ? HOT HEMOSTASIS N/A 01/05/2021  ?  Procedure: HOT HEMOSTASIS (ARGON PLASMA COAGULATION/BICAP);  Surgeon: Carol Ada, MD;  Location: [facility];  Service: Endoscopy;  Laterality: N/A;  ? HOT HEMOSTASIS N/A 12/05/2021  ? Procedure: HOT HEMOSTASIS (ARGON PLASMA COAGULATION/BICAP);  Surgeon: Carol Ada, MD;  Location: [facility];  Service: Gastroenterology;  Laterality: N/A;  ? IR RADIOLOGIST EVAL & MGMT  12/14/2016  ? IR RADIOLOGIST EVAL & MGMT  07/26/2021  ? LEFT HEART CATH AND CORS/GRAFTS ANGIOGRAPHY N/A 01/09/2018  ? Procedure: LEFT HEART CATH AND CORS/GRAFTS ANGIOGRAPHY;  Surgeon: Charolette Forward, MD;  Location: [place] [facility];  Service:  Cardiovascular;  Laterality: N/A;  ? LEFT HEART CATHETERIZATION WITH CORONARY ANGIOGRAM N/A 08/03/2014  ? Procedure: LEFT HEART CATHETERIZATION WITH CORONARY ANGIOGRAM;  Surgeon: Birdie Riddle, MD;  Windell Moulding

## 2021-12-24 NOTE — H&P (View-Only) (Signed)
[facility] Consult Note ? ? ?History ?[name] ?MRN # [id number] ? ?Date of Admission: 12/23/2021 ?Date of Consultation: 12/24/2021 ?Referring physician: [name], MD ?Primary Care Provider: [name], MD ?Primary Gastroenterologist: [name] ? ? ?Reason for Consultation/Chief Complaint: Melena and acute on chronic anemia ? ?Subjective  ?HPI: ? ?This is a 81 year old woman with multiple medical issues as outlined below, well-known to [name] including with a recent admission for recurrent melena and acute on chronic blood loss anemia.  She is known to have recurrent small bowel AVMs with multiple endoscopic procedures for ablation, most recently during hospitalization about 3 weeks ago, at which time nonbleeding jejunal AVMs were treated with APC. ?She then had an outpatient video capsule study on May 4, but the report indicates no small bowel visualization because the capsule was retained in the stomach.  Recommendation from the report indicates repeat attempted study if patient has recurrent melena and anemia. ? ?She came to the ED and was admitted yesterday for recurrent melena and anemia with a hemoglobin of 8.2, down from 8.9 on 12/05/2021.  Her hemoglobin dropped to 6.7 this morning, she remains alert and conversational with no chest pain or dyspnea, and she is hemodynamically stable.  She has some chronic right-sided abdominal discomfort that has persisted.  She has been seen by internal medicine service and I was notified of this consult about an hour ago.  PRBC transfusion is planned, and I discussed the case with [name] of the [facility] ? ?ROS: ? ?Constitutional: Fatigue, weight stable ?Arthralgias ? ?All other systems are negative except as noted above in the HPI ? ?Past Medical History ?Past Medical History:  ?Diagnosis Date  ? 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 Asthma    ? 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  ? Blood in stool   ? last week of aug 2018  ? CAP (community acquired pneumonia) 10/22/2015  ? Chronic kidney disease   ? COPD (chronic obstructive pulmonary disease) ([place])   ? Coronary artery disease   ? 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  ? Diverticulitis   ? Dyspnea   ? with exertion  ? Gastroesophageal reflux disease   ? occ  ? Glaucoma   ? right eye  ? Headache syndrome 11/27/2018  ? Hiatal hernia   ? History of shingles 06/23/2018  ? Hyperlipidemia   ? Hypertension   ? Lung cancer ([place]) dx 2018  ? 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 Myocardial infarction ([place]) 01/01/2000  ? Cardiac catheterization  ? Peripheral vascular disease ([place])   ? stents in legs x 2 or 3  ? Pneumonia   ? last time winter 2017 -2018  ? PONV (postoperative nausea and vomiting)   ? occassionally, last colonscopy did ok with anesthesia  ? Primary cancer of right lower lobe of lung ([place]) 04/25/2016  ? Right-sided carotid artery disease ([place]) 01/07/2013  ? TIA (transient ischemic attack) 02/11/2019  ? Wears dentures   ? Full set  ? Wears glasses   ? ? ?Past Surgical History ?Past Surgical History:  ?Procedure Laterality Date  ? ABDOMINAL HYSTERECTOMY    ? APPENDECTOMY    ? Arnold-chiari malformation repair  1998  ? Suboccipital craniectomy  ? CAROTID ENDARTERECTOMY  03/29/2010  ? Left  CEA  ? CHOLECYSTECTOMY    ? Gall Bladder  ? COLONOSCOPY WITH PROPOFOL N/A 04/22/2015  ?  Procedure: COLONOSCOPY WITH PROPOFOL;  Surgeon: Carol Ada, MD;  [facility];  Service: Endoscopy;  Laterality: N/A;  ? COLONOSCOPY WITH PROPOFOL N/A 05/25/2016  ? Procedure: COLONOSCOPY WITH PROPOFOL;  Surgeon: Carol Ada, MD;  [facility];  Service: Endoscopy;  Laterality: N/A;  ? COLONOSCOPY WITH PROPOFOL N/A 05/03/2017  ? Procedure: COLONOSCOPY WITH PROPOFOL;  Surgeon: Carol Ada, MD;  [facility];  Service: Endoscopy;  Laterality: N/A;  ? COLONOSCOPY WITH PROPOFOL N/A 06/29/2020   ? Procedure: COLONOSCOPY WITH PROPOFOL;  Surgeon: Carol Ada, MD;  [facility];  Service: Endoscopy;  Laterality: N/A;  ? COLONOSCOPY WITH PROPOFOL N/A 01/05/2021  ? Procedure: COLONOSCOPY WITH PROPOFOL;  Surgeon: Carol Ada, MD;  [facility];  Service: Endoscopy;  Laterality: N/A;  ? COLONOSCOPY WITH PROPOFOL N/A 09/28/2021  ? Procedure: COLONOSCOPY WITH PROPOFOL;  Surgeon: Carol Ada, MD;  Location: [place];  Service: Endoscopy;  Laterality: N/A;  ? 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 CORONARY ARTERY BYPASS GRAFT  01/01/2000  ? x 3  ? ENDARTERECTOMY Right 09/26/2021  ? Procedure: RIGHT CAROTID ENDARTERECTOMY;  Surgeon: [name], MD;  [facility];  Service: Vascular;  Laterality: Right;  ? ENTEROSCOPY N/A 02/27/2018  ? Procedure: ENTEROSCOPY;  Surgeon: Carol Ada, MD;  [facility];  Service: Endoscopy;  Laterality: N/A;  ? ENTEROSCOPY N/A 07/18/2018  ? Procedure: ENTEROSCOPY;  Surgeon: Carol Ada, MD;  [facility];  Service: Endoscopy;  Laterality: N/A;  ? ENTEROSCOPY N/A 06/29/2020  ? Procedure: ENTEROSCOPY;  Surgeon: Carol Ada, MD;  [facility];  Service: Endoscopy;  Laterality: N/A;  ? ENTEROSCOPY N/A 12/05/2021  ? Procedure: ENTEROSCOPY;  Surgeon: Carol Ada, MD;  Location: [facility];  Service: Gastroenterology;  Laterality: N/A;  ? ESOPHAGOGASTRODUODENOSCOPY N/A 05/25/2016  ? Procedure: ESOPHAGOGASTRODUODENOSCOPY (EGD);  Surgeon: Carol Ada, MD;  Location: [facility];  Service: Endoscopy;  Laterality: N/A;  ? ESOPHAGOGASTRODUODENOSCOPY N/A 08/01/2018  ? Procedure: ESOPHAGOGASTRODUODENOSCOPY (EGD);  Surgeon: Carol Ada, MD;  Location: [facility];  Service: Endoscopy;  Laterality: N/A;  ? ESOPHAGOGASTRODUODENOSCOPY (EGD) WITH PROPOFOL N/A 09/28/2021  ? Procedure: ESOPHAGOGASTRODUODENOSCOPY (EGD) WITH PROPOFOL;  Surgeon: Carol Ada, MD;  Location: [place];  Service: Endoscopy;  Laterality: N/A;  ? EYE  SURGERY Right 1995 or 1996  ? Laser surgery for retinal hemorrhage  ? GIVENS CAPSULE STUDY N/A 07/16/2018  ? Procedure: GIVENS CAPSULE STUDY;  Surgeon: Carol Ada, MD;  [facility];  Service: Endoscopy;  Laterality: N/A;  ? GIVENS CAPSULE STUDY N/A 12/14/2021  ? Procedure: GIVENS CAPSULE STUDY;  Surgeon: Carol Ada, MD;  Location: [place];  Service: Gastroenterology;  Laterality: N/A;  ? HEMOSTASIS CLIP PLACEMENT  06/29/2020  ? Procedure: HEMOSTASIS CLIP PLACEMENT;  Surgeon: Carol Ada, MD;  [facility];  Service: Endoscopy;;  ? HEMOSTASIS CLIP PLACEMENT  01/05/2021  ? Procedure: HEMOSTASIS CLIP PLACEMENT;  Surgeon: Carol Ada, MD;  [facility];  Service: Endoscopy;;  ? HOT HEMOSTASIS N/A 02/27/2018  ? Procedure: HOT HEMOSTASIS (ARGON PLASMA COAGULATION/BICAP);  Surgeon: Carol Ada, MD;  Location: [facility];  Service: Endoscopy;  Laterality: N/A;  ? HOT HEMOSTASIS N/A 08/01/2018  ? Procedure: HOT HEMOSTASIS (ARGON PLASMA COAGULATION/BICAP);  Surgeon: Carol Ada, MD;  Location: [facility];  Service: Endoscopy;  Laterality: N/A;  ? HOT HEMOSTASIS N/A 06/29/2020  ? Procedure: HOT HEMOSTASIS (ARGON PLASMA COAGULATION/BICAP);  Surgeon: Carol Ada, MD;  Location: [facility];  Service: Endoscopy;  Laterality: N/A;  ? HOT HEMOSTASIS N/A 01/05/2021  ?  Procedure: HOT HEMOSTASIS (ARGON PLASMA COAGULATION/BICAP);  Surgeon: Carol Ada, MD;  Location: [facility];  Service: Endoscopy;  Laterality: N/A;  ? HOT HEMOSTASIS N/A 12/05/2021  ? Procedure: HOT HEMOSTASIS (ARGON PLASMA COAGULATION/BICAP);  Surgeon: Carol Ada, MD;  Location: [facility];  Service: Gastroenterology;  Laterality: N/A;  ? IR RADIOLOGIST EVAL & MGMT  12/14/2016  ? IR RADIOLOGIST EVAL & MGMT  07/26/2021  ? LEFT HEART CATH AND CORS/GRAFTS ANGIOGRAPHY N/A 01/09/2018  ? Procedure: LEFT HEART CATH AND CORS/GRAFTS ANGIOGRAPHY;  Surgeon: Charolette Forward, MD;  Location: [place] [facility];  Service:  Cardiovascular;  Laterality: N/A;  ? LEFT HEART CATHETERIZATION WITH CORONARY ANGIOGRAM N/A 08/03/2014  ? Procedure: LEFT HEART CATHETERIZATION WITH CORONARY ANGIOGRAM;  Surgeon: Birdie Riddle, MD;  Windell Moulding

## 2021-12-25 ENCOUNTER — Inpatient Hospital Stay (HOSPITAL_COMMUNITY): Payer: Medicare Other | Admitting: Certified Registered"

## 2021-12-25 ENCOUNTER — Encounter (HOSPITAL_COMMUNITY): Payer: Self-pay | Admitting: Family Medicine

## 2021-12-25 ENCOUNTER — Encounter (HOSPITAL_COMMUNITY)
Admission: EM | Disposition: A | Discharge status: Home / Self Care | Payer: Self-pay | Source: Home / Self Care | Attending: Family Medicine

## 2021-12-25 DIAGNOSIS — I509 Heart failure, unspecified: Secondary | ICD-10-CM

## 2021-12-25 DIAGNOSIS — K922 Gastrointestinal hemorrhage, unspecified: Secondary | ICD-10-CM | POA: Diagnosis not present

## 2021-12-25 DIAGNOSIS — K921 Melena: Secondary | ICD-10-CM

## 2021-12-25 DIAGNOSIS — I2510 Atherosclerotic heart disease of native coronary artery without angina pectoris: Secondary | ICD-10-CM

## 2021-12-25 DIAGNOSIS — I110 Hypertensive heart disease with heart failure: Secondary | ICD-10-CM

## 2021-12-25 DIAGNOSIS — Z87891 Personal history of nicotine dependence: Secondary | ICD-10-CM

## 2021-12-25 HISTORY — PX: GIVENS CAPSULE STUDY: SHX5432

## 2021-12-25 HISTORY — PX: ENTEROSCOPY: SHX5533

## 2021-12-25 LAB — BASIC METABOLIC PANEL
Anion gap: 5 (ref 5–15)
BUN: 26 mg/dL — ABNORMAL HIGH (ref 8–23)
CO2: 25 mmol/L (ref 22–32)
Calcium: 8.6 mg/dL — ABNORMAL LOW (ref 8.9–10.3)
Chloride: 109 mmol/L (ref 98–111)
Creatinine, Ser: 1.72 mg/dL — ABNORMAL HIGH (ref 0.44–1.00)
GFR, Estimated: 30 mL/min — ABNORMAL LOW (ref >60)
Glucose, Bld: 103 mg/dL — ABNORMAL HIGH (ref 70–99)
Potassium: 4.5 mmol/L (ref 3.5–5.1)
Sodium: 139 mmol/L (ref 135–145)

## 2021-12-25 LAB — TYPE AND SCREEN
ABO/RH(D): A POS
Antibody Screen: POSITIVE
Unit division: 0

## 2021-12-25 LAB — HEMOGLOBIN AND HEMATOCRIT, BLOOD
HCT: 25.6 % — ABNORMAL LOW (ref 36.0–46.0)
HCT: 26.7 % — ABNORMAL LOW (ref 36.0–46.0)
HCT: 27.9 % — ABNORMAL LOW (ref 36.0–46.0)
Hemoglobin: 8.3 g/dL — ABNORMAL LOW (ref 12.0–15.0)
Hemoglobin: 8.6 g/dL — ABNORMAL LOW (ref 12.0–15.0)
Hemoglobin: 9.1 g/dL — ABNORMAL LOW (ref 12.0–15.0)

## 2021-12-25 LAB — BPAM RBC
Blood Product Expiration Date: 202306142359
ISSUE DATE / TIME: 202305141549
Unit Type and Rh: 6200

## 2021-12-25 SURGERY — ENTEROSCOPY
Anesthesia: Monitor Anesthesia Care

## 2021-12-25 MED ORDER — LACTATED RINGERS IV SOLN: INTRAVENOUS | Status: DC | PRN

## 2021-12-25 MED ORDER — EPHEDRINE SULFATE-NACL 50-0.9 MG/10ML-% IV SOSY
PREFILLED_SYRINGE | INTRAVENOUS | Status: DC | PRN
  Administered 2021-12-25: 5 mg via INTRAVENOUS

## 2021-12-25 MED ORDER — PROPOFOL 10 MG/ML IV BOLUS
INTRAVENOUS | Status: DC | PRN
  Administered 2021-12-25 (×2): 20 mg via INTRAVENOUS
  Administered 2021-12-25: 10 mg via INTRAVENOUS

## 2021-12-25 MED ORDER — PROPOFOL 1000 MG/100ML IV EMUL
INTRAVENOUS | Status: AC
  Filled 2021-12-25: qty 100

## 2021-12-25 MED ORDER — PROPOFOL 500 MG/50ML IV EMUL
INTRAVENOUS | Status: DC | PRN
  Administered 2021-12-25: 125 ug/kg/min via INTRAVENOUS

## 2021-12-25 SURGICAL SUPPLY — 1 items: TOWEL COTTON PACK 4EA (MISCELLANEOUS) ×4 IMPLANT

## 2021-12-25 NOTE — Progress Notes (Signed)
[facility] ? ?PROGRESS NOTE ? ?SYLVA OVERLEY JOI:[id number] DOB: 1940/12/08 DOA: 12/23/2021 ?PCP: [name], MD ? ? ?Brief HPI:   ? ?81 y.o. female, with medical history of common iliac artery aneurysm, aortoiliac occlusive disease, Arnold-Chiari malformation, asthma/COPD, back bilateral occipital neuralgia, carotid artery stenosis, stage III CKD, history of CAD s/p CABG x3, diverticulosis, GERD, herpes zoster, right eye glaucoma, hyperlipidemia, lung cancer s/p radiation in 2018, peripheral vascular disease with lower extremity stent placement who was recently discharged from the hospital on 12/05/2021 after she was treated for melena.  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  GI recommended to restart taking Plavix and Pletal. ?Patient says that after she started taking Plavix she noticed black stool.  Patient was told by Dr. Benson Norway that if she developed black stools then she should go to the ED.  Patient came to the hospital for further evaluation. ? ? ?Subjective  ? ?Patient seen and examined.Plan for capsule endoscopy/EGD today.  Denies any complaints.   ? ? Assessment/Plan:  ? ? ?GI bleed ?-FOBT positive ?-Gastroenterology consulted, plan for small bowel enteroscopy/capsule study to check for AVMs in small bowel ?-We will start octreotide 50 mcg subcu 3 times daily as per GI recommendation ?-Continue Protonix 40 mg IV every 12 hours ?-Capsule endoscopy/EGD today  ? ?Anemia, due to above ?-Hemoglobin Was down to 6.7 yesterday  ?-S/p 1 unit PRBC  ?-Hemoglobin is up to 9.1 today  ? ?Acute kidney injury ?-Creatinine has improved to 1.72 ?- ? ?Chronic diastolic CHF ?-Echocardiogram showed EF of 60 to 65% with grade 1 diastolic dysfunction ?-Patient is euvolemic at this time ?-Lasix and irbesartan on hold due to acute kidney injury ? ?S/p carotid endarterectomy ?-S/p right carotid  endarterectomy with bovine pericardial patch angioplasty on 09/26/2021.  Plavix/Pletal currently on hold due to GI bleed. ? ?Peripheral vascular disease ?-Hold Pletal, Plavix, Lipitor ? ?Hypertension ?-Benicar on hold ? ?History of TIA ?-Plavix on hold ? ?COPD ?-Not in exacerbation ? ?Medications ? ?  ? fluticasone furoate-vilanterol  1 puff Inhalation Daily  ? And  ? umeclidinium bromide  1 puff Inhalation Daily  ? octreotide  50 mcg Subcutaneous Q8H  ? pantoprazole (PROTONIX) IV  40 mg Intravenous Q12H  ? ? ? Data Reviewed:  ? ?CBG: ? ?No results for input(s): GLUCAP in the last 168 hours. ? ?SpO2: 94 %  ? ? ?Vitals:  ? 12/25/21 1300 12/25/21 1310 12/25/21 1314 12/25/21 1352  ?BP: (!) 142/32 (!) 140/26 (!) 156/38 (!) 150/41  ?Pulse: (!) 56 (!) 52 (!) 52 (!) 52  ?Resp: (!) 21 19 (!) 21 16  ?Temp:    98 ?F (36.7 ?C)  ?TempSrc:    Oral  ?SpO2: 96% 95% 94% 94%  ?Weight:      ?Height:      ? ? ? ? ?Data Reviewed: ? ?Basic Metabolic Panel: ?Recent Labs  ?Lab 12/23/21 ?1214 12/24/21 ?0559 12/25/21 ?0240  ?NA 136 142 139  ?K 4.3 4.2 4.5  ?CL 104 109 109  ?CO2 23 26 25   ?GLUCOSE 142* 95 103*  ?BUN 38* 33* 26*  ?CREATININE 2.05* 1.77* 1.72*  ?CALCIUM 9.2 9.1 8.6*  ? ? ?CBC: ?Recent Labs  ?Lab 12/23/21 ?1214 12/24/21 ?0559 12/24/21 ?2004 12/25/21 ?0240 12/25/21 ?0757 12/25/21 ?1353  ?WBC 7.4 4.8  --   --   --   --   ?HGB 8.2* 6.7*  8.7* 8.3* 8.6* 9.1*  ?HCT 25.6* 21.4* 26.5* 25.6* 26.7* 27.9*  ?MCV 93.8 95.1  --   --   --   --   ?PLT 391 302  --   --   --   --   ? ? ?LFT ?Recent Labs  ?Lab 12/23/21 ?1214 12/24/21 ?0559  ?AST 17 15  ?ALT 11 10  ?ALKPHOS 74 65  ?BILITOT 0.4 0.5  ?PROT 7.2 6.1*  ?ALBUMIN 3.6 3.1*  ? ?  ?Antibiotics: ?Anti-infectives (From admission, onward)  ? ? None  ? ?  ? ? ? ?DVT prophylaxis: SCDs ? ?Code Status: Full code ? ?Family Communication: No family at bedside ? ? ?CONSULTS gastroenterology ? ? ?Objective  ? ? ?Physical Examination: ? ? ?General-appears in no acute distress ?Heart-S1-S2, regular, no  murmur auscultated ?Lungs-clear to auscultation bilaterally, no wheezing or crackles auscultated ?Abdomen-soft, nontender, no organomegaly ?Extremities-no edema in the lower extremities ?Neuro-alert, oriented x3, no focal deficit noted ?Status is: Inpatient:   ? ? ?  ? ? ?[name] ?  ?[facility] ?If 7PM-7AM, please contact night-coverage at [email or web address], ?Office  [phone] ? ? ?12/25/2021, 3:29 PM  LOS: 1 day  ? ? ? ? ? ? ? ? ? ? ?  ?

## 2021-12-25 NOTE — Anesthesia Postprocedure Evaluation (Signed)
Anesthesia Post Note ? ?Patient: [name] ? ?Procedure(s) Performed: ENTEROSCOPY ?GIVENS CAPSULE STUDY ? ?  ? ?Patient location during evaluation: Endoscopy ?Anesthesia Type: MAC ?Level of consciousness: patient cooperative, awake and alert and oriented ?Pain management: pain level controlled ?Vital Signs Assessment: post-procedure vital signs reviewed and stable ?Respiratory status: nonlabored ventilation, spontaneous breathing and respiratory function stable ?Cardiovascular status: blood pressure returned to baseline and stable ?Postop Assessment: no apparent nausea or vomiting ?Anesthetic complications: no ? ? ?No notable events documented. ? ?Last Vitals:  ?Vitals:  ? 12/25/21 1310 12/25/21 1314  ?BP: (!) 140/26 (!) 156/38  ?Pulse: (!) 52 (!) 52  ?Resp: 19 (!) 21  ?Temp:    ?SpO2: 95% 94%  ?  ?Last Pain:  ?Vitals:  ? 12/25/21 1314  ?TempSrc:   ?PainSc: 0-No pain  ? ? ?  ?  ?  ?  ?  ?  ? ?Devereaux Grayson,E. Eleonor Ocon ? ? ? ? ?

## 2021-12-25 NOTE — Anesthesia Preprocedure Evaluation (Addendum)
Anesthesia Evaluation  ?Patient identified by MRN, date of birth, ID band ?Patient awake ? ? ? ?Reviewed: ?Allergy & Precautions, NPO status , Patient's Chart, lab work & pertinent test results ? ?History of Anesthesia Complications ?(+) PONV ? ?Airway ?Mallampati: II ? ?TM Distance: >3 FB ?Neck ROM: Full ? ? ? Dental ? ?(+) Edentulous Upper, Edentulous Lower, Lower Dentures, Upper Dentures ?  ?Pulmonary ?COPD,  COPD inhaler, former smoker,  ?Lung cancer ?  ?breath sounds clear to auscultation ? ? ? ? ? ? Cardiovascular ?hypertension, Pt. on medications ?(-) angina+ CAD, + CABG, + Peripheral Vascular Disease and +CHF  ? ?Rhythm:Regular Rate:Normal ? ?11/2021 ECHO: EF 60 to 65%. The LV has normal function, no regional wall motion abnormalities. Grade I DD, no significant valvular abnormalities ?  ?Neuro/Psych ? Headaches, TIA  ? GI/Hepatic ?Neg liver ROS, hiatal hernia, GERD  Controlled and Medicated,  ?Endo/Other  ?negative endocrine ROS ? Renal/GU ?Renal InsufficiencyRenal disease  ? ?  ?Musculoskeletal ? ? Abdominal ?  ?Peds ? Hematology ? ?(+) Blood dyscrasia (Hb 8.6), anemia , plavix   ?Anesthesia Other Findings ? ? Reproductive/Obstetrics ? ?  ? ? ? ? ? ? ? ? ? ? ? ? ? ?  ?  ? ? ? ? ? ? ? ?Anesthesia Physical ?Anesthesia Plan ? ?ASA: 3 ? ?Anesthesia Plan: MAC  ? ?Post-op Pain Management: Minimal or no pain anticipated  ? ?Induction:  ? ?PONV Risk Score and Plan: 2 and Treatment may vary due to age or medical condition ? ?Airway Management Planned: Natural Airway and Nasal Cannula ? ?Additional Equipment: None ? ?Intra-op Plan:  ? ?Post-operative Plan:  ? ?Informed Consent: I have reviewed the patients History and Physical, chart, labs and discussed the procedure including the risks, benefits and alternatives for the proposed anesthesia with the patient or authorized representative who has indicated his/her understanding and acceptance.  ? ?Patient has DNR.  ?Discussed DNR with  patient and Suspend DNR. ?  ?Dental advisory given ? ?Plan Discussed with: CRNA and Surgeon ? ?Anesthesia Plan Comments:   ? ? ? ? ? ?Anesthesia Quick Evaluation ? ?

## 2021-12-25 NOTE — Anesthesia Procedure Notes (Signed)
Procedure Name: [place] ?Date/Time: 12/25/2021 12:25 PM ?Performed by: Niel Hummer, CRNA ?Pre-anesthesia Checklist: Patient identified, Emergency Drugs available, Suction available and Patient being monitored ?Oxygen Delivery Method: Simple face mask ? ? ? ? ?

## 2021-12-25 NOTE — Transfer of Care (Signed)
Immediate Anesthesia Transfer of Care Note ? ?Patient: MERINDA VICTORINO ? ?Procedure(s) Performed: ENTEROSCOPY ?GIVENS CAPSULE STUDY ? ?Patient Location: PACU ? ?Anesthesia Type:MAC ? ?Level of Consciousness: awake, alert  and oriented ? ?Airway & Oxygen Therapy: Patient Spontanous Breathing and Patient connected to face mask oxygen ? ?Post-op Assessment: Report given to RN, Post -op Vital signs reviewed and stable and Patient moving all extremities X 4 ? ?Post vital signs: Reviewed and stable ? ?Last Vitals:  ?Vitals Value Taken Time  ?BP 145/28   ?Temp    ?Pulse 66 12/25/21 1245  ?Resp 22 12/25/21 1245  ?SpO2 100 % 12/25/21 1245  ?Vitals shown include unvalidated device data. ? ?Last Pain:  ?Vitals:  ? 12/25/21 1206  ?TempSrc: Temporal  ?PainSc: 8   ?   ? ?  ? ?Complications: No notable events documented. ?

## 2021-12-25 NOTE — Interval H&P Note (Signed)
History and Physical Interval Note: ? ?12/25/2021 ?12:13 PM ? ?[name]  has presented today for surgery, with the diagnosis of melena, anemia.  The various methods of treatment have been discussed with the patient and family. After consideration of risks, benefits and other options for treatment, the patient has consented to  Procedure(s) with comments: ?ENTEROSCOPY (N/A) - with endoscopic placement of video capsule as a surgical intervention.  The patient's history has been reviewed, patient examined, no change in status, stable for surgery.  I have reviewed the patient's chart and labs.  Questions were answered to the patient's satisfaction.   ? ? ?[name] ? ? ?

## 2021-12-25 NOTE — Op Note (Signed)
[facility] ?Patient Name: [name] ?Procedure Date: 12/25/2021 ?MRN: [id number] ?Attending MD: Carol Ada , MD ?Date of Birth: 01-19-1941 ?CSN: [id number] ?Age: 81 ?Admit Type: Outpatient ?Procedure:                Upper GI endoscopy ?Indications:              Melena ?Providers:                Carol Ada, MD, William Dalton, Technician, Debi  ?                          Mays, RN ?Referring MD:              ?Medicines:                Propofol per Anesthesia ?Complications:            No immediate complications. ?Estimated Blood Loss:     Estimated blood loss: none. ?Procedure:                Pre-Anesthesia Assessment: ?                          - Prior to the procedure, a History and Physical  ?                          was performed, and patient medications and  ?                          allergies were reviewed. The patient's tolerance of  ?                          previous anesthesia was also reviewed. The risks  ?                          and benefits of the procedure and the sedation  ?                          options and risks were discussed with the patient.  ?                          All questions were answered, and informed consent  ?                          was obtained. Prior Anticoagulants: The patient has  ?                          taken no previous anticoagulant or antiplatelet  ?                          agents. ASA Grade Assessment: III - A patient with  ?                          severe systemic disease. After reviewing the risks  ?                          and benefits,  the patient was deemed in  ?                          satisfactory condition to undergo the procedure. ?                          - Sedation was administered by an anesthesia  ?                          professional. Deep sedation was attained. ?                          After obtaining informed consent, the endoscope was  ?                          passed under direct vision. Throughout the  ?                           procedure, the patient's blood pressure, pulse, and  ?                          oxygen saturations were monitored continuously. The  ?                          [id number]-[id number] ([id number]) Olympus endoscope was introduced  ?                          through the mouth and advanced to the second part  ?                          of duodenum. After obtaining informed consent, the  ?                          endoscope was passed under direct vision.  ?                          Throughout the procedure, the patient's blood  ?                          pressure, pulse, and oxygen saturations were  ?                          monitored continuously.The small bowel enteroscopy  ?                          was accomplished without difficulty. The patient  ?                          tolerated the procedure well. ?Scope In: ?Scope Out: ?Findings: ?     The esophagus was normal. ?     The stomach was normal. ?     The examined duodenum was normal. ?     The EGD was normal. The VCE was endoscopically deployed in the duodenal  ?     bulb. Some trauma was induced with the deployment mechanism. ?  Impression:               - Normal esophagus. ?                          - Normal stomach. ?                          - Normal examined duodenum. ?                          - No specimens collected. ?Moderate Sedation: ?     Not Applicable - Patient had care per Anesthesia. ?Recommendation:           - Return patient to hospital ward for ongoing care. ?                          - Clear liquid diet. ?                          - Continue present medications. ?                          - Await capsule reading. ?                          - Consider Plavix as the only anticoagulation as  ?                          the addtion of Pletal appears to increase her  ?                          bleeding. ?Procedure Code(s):        --- Professional --- ?                          [phone], Esophagogastroduodenoscopy, flexible,  ?                           transoral; diagnostic, including collection of  ?                          specimen(s) by brushing or washing, when performed  ?                          (separate procedure) ?Diagnosis Code(s):        --- Professional --- ?                          K92.1, Melena (includes Hematochezia) ?CPT copyright 2019 American Medical Association. All rights reserved. ?The codes documented in this report are preliminary and upon coder review may  ?be revised to meet current compliance requirements. ?Carol Ada, MD ?Carol Ada, MD ?12/25/2021 12:50:24 PM ?This report has been signed electronically. ?Number of Addenda: 0 ?

## 2021-12-26 ENCOUNTER — Encounter (HOSPITAL_COMMUNITY): Payer: Self-pay | Admitting: Gastroenterology

## 2021-12-26 LAB — CBC
HCT: 25.5 % — ABNORMAL LOW (ref 36.0–46.0)
Hemoglobin: 8.2 g/dL — ABNORMAL LOW (ref 12.0–15.0)
MCH: 30.7 pg (ref 26.0–34.0)
MCHC: 32.2 g/dL (ref 30.0–36.0)
MCV: 95.5 fL (ref 80.0–100.0)
Platelets: 265 10*3/uL (ref 150–400)
RBC: 2.67 MIL/uL — ABNORMAL LOW (ref 3.87–5.11)
RDW: 14.9 % (ref 11.5–15.5)
WBC: 5.5 10*3/uL (ref 4.0–10.5)
nRBC: 0 % (ref 0.0–0.2)

## 2021-12-26 LAB — BASIC METABOLIC PANEL
Anion gap: 6 (ref 5–15)
BUN: 21 mg/dL (ref 8–23)
CO2: 26 mmol/L (ref 22–32)
Calcium: 8.6 mg/dL — ABNORMAL LOW (ref 8.9–10.3)
Chloride: 109 mmol/L (ref 98–111)
Creatinine, Ser: 1.51 mg/dL — ABNORMAL HIGH (ref 0.44–1.00)
GFR, Estimated: 35 mL/min — ABNORMAL LOW (ref >60)
Glucose, Bld: 91 mg/dL (ref 70–99)
Potassium: 4.5 mmol/L (ref 3.5–5.1)
Sodium: 141 mmol/L (ref 135–145)

## 2021-12-26 LAB — MAGNESIUM: Magnesium: 2.3 mg/dL (ref 1.7–2.4)

## 2021-12-26 NOTE — TOC Initial Note (Signed)
Transition of Care (TOC) - Initial/Assessment Note  ? ? ?Patient Details  ?Name: [name] ?MRN: [id number] ?Date of Birth: Jul 09, 1941 ? ?Transition of Care (TOC) CM/SW Contact:    ?Philo Kurtz, Marjie Skiff, RN ?Phone Number: ?12/26/2021, 1:03 PM ? ?Clinical Narrative:                 ? ?Expected Discharge Plan: Home/Self Care ?Barriers to Discharge: Continued Medical Work up ? ? ?Patient Goals and CMS Choice ?Patient states their goals for this hospitalization and ongoing recovery are:: To go home ?  ? ? ?Expected Discharge Plan and Services ?Expected Discharge Plan: Home/Self Care ?  ?Discharge Planning Services: CM Consult ?  ?Living arrangements for the past 2 months: Single Family Home ?                ?  ? ?Prior Living Arrangements/Services ?Living arrangements for the past 2 months: [place] ?Lives with:: Adult Children ?Patient language and need for interpreter reviewed:: Yes ?       ?Need for Family Participation in Patient Care: Yes (Comment) ?Care giver support system in place?: Yes (comment) ?  ?Criminal Activity/Legal Involvement Pertinent to Current Situation/Hospitalization: No - Comment as needed ? ?Activities of Daily Living ?Home Assistive Devices/Equipment: None ?ADL Screening (condition at time of admission) ?Patient's cognitive ability adequate to safely complete daily activities?: Yes ?Is the patient deaf or have difficulty hearing?: No ?Does the patient have difficulty seeing, even when wearing glasses/contacts?: No ?Does the patient have difficulty concentrating, remembering, or making decisions?: No ?Patient able to express need for assistance with ADLs?: Yes ?Does the patient have difficulty dressing or bathing?: No ?Independently performs ADLs?: Yes (appropriate for developmental age) ?Does the patient have difficulty walking or climbing stairs?: No ?Weakness of Legs: None ?Weakness of Arms/Hands: None ? ? ?Emotional Assessment ?Appearance:: Appears stated age ?  ?  ?  ?Alcohol /  Substance Use: Not Applicable ?Psych Involvement: No (comment) ? ?Admission diagnosis:  Rectal bleeding [K62.5] ?GI bleed [K92.2] ?Patient Active Problem List  ? Diagnosis Date Noted  ? History of transient ischemic attack (TIA) 12/03/2021  ? S/P carotid endarterectomy 12/03/2021  ? Rectal bleeding 12/02/2021  ? Melena 12/02/2021  ? Chronic diastolic CHF (congestive heart failure) ([place]) 11/18/2021  ? Numbness 11/18/2021  ? Malnutrition of moderate degree 09/29/2021  ? Asymptomatic stenosis of right carotid artery without infarction 09/26/2021  ? Iron deficiency anemia due to chronic blood loss 08/31/2021  ? Acute diastolic CHF (congestive heart failure) ([place]) 07/03/2020  ? Symptomatic anemia 07/02/2020  ? TIA (transient ischemic attack) 02/11/2019  ? Headache syndrome 11/27/2018  ? GI bleed 07/15/2018  ? Occult blood positive stool 07/14/2018  ? History of shingles 06/23/2018  ? Neuralgia and neuritis 06/23/2018  ? Costochondritis, acute 04/18/2018  ? Hyperkalemia 02/06/2017  ? Physical deconditioning 11/08/2016  ? Acute lower GI bleeding   ? BRBPR (bright red blood per rectum) 09/15/2016  ? Iron deficiency anemia 06/07/2016  ? Bronchitis, acute 06/07/2016  ? 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  ? Primary cancer of right lower lobe of lung ([place]) 04/25/2016  ? S/P CABG x 3 10/28/2015  ? Foot pain, right 10/24/2015  ? CAP (community acquired pneumonia) 10/22/2015  ? UTI (urinary tract infection) 10/22/2015  ? COPD exacerbation ([place]) 10/22/2015  ? Neck pain on right side 01/05/2015  ? Chest pain 08/02/2014  ? Intractable headache 08/02/2014  ? HLD (hyperlipidemia) 08/02/2014  ? Essential hypertension 08/02/2014  ? GERD (gastroesophageal reflux disease) 08/02/2014  ?  Leg pain, right 08/02/2014  ? HA (headache)   ? Carotid artery stenosis 12/09/2013  ? 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  ? Dehydration 04/02/2013  ? CAD (coronary artery disease) 04/02/2013  ? AKI (acute kidney injury) ([place]) 04/01/2013  ? Nausea & vomiting  04/01/2013  ? Diarrhea 04/01/2013  ? Hypokalemia 04/01/2013  ? Hyponatremia 04/01/2013  ? Peripheral vascular disease ([place]) 01/07/2013  ? Right-sided carotid artery disease ([place]) 01/07/2013  ? Dizziness and giddiness 01/07/2013  ? Shortness of breath 01/07/2013  ? Cough 11/17/2012  ? COPD GOLD II 11/17/2012  ? ?PCP:  [name], MD ?Pharmacy:   ?[id number] [place], [place] ?[address] ?[place] [address] ?Phone: [phone] Fax: [phone] ? ? ? ? ?Social Determinants of Health (SDOH) Interventions ?  ? ?Readmission Risk Interventions ? ?  12/26/2021  ? 12:57 PM  ?Readmission Risk Prevention Plan  ?Transportation Screening Complete  ?PCP or Specialist Appt within 3-5 Days Complete  ?[place] or Home Care Consult Complete  ?Social Work Consult for [place] Planning/Counseling Complete  ?Palliative Care Screening Not Applicable  ?Medication Review Press photographer) Complete  ? ? ? ?

## 2021-12-26 NOTE — Progress Notes (Signed)
[facility] ? ?PROGRESS NOTE ? ?[name] ZJQ:[id number] DOB: 10/31/1940 DOA: 12/23/2021 ?PCP: Natalie Limbo, MD ? ? ?Brief HPI:   ? ?81 y.o. female, with medical history of common iliac artery aneurysm, aortoiliac occlusive disease, Arnold-Chiari malformation, asthma/COPD, back bilateral occipital neuralgia, carotid artery stenosis, stage III CKD, history of CAD s/p CABG x3, diverticulosis, GERD, herpes zoster, right eye glaucoma, hyperlipidemia, lung cancer s/p radiation in 2018, peripheral vascular disease with lower extremity stent placement who was recently discharged from the hospital on 12/05/2021 after she was treated for melena.  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  GI recommended to restart taking Plavix and Pletal. ?Patient says that after she started taking Plavix she noticed black stool.  Patient was told by Dr. Benson Mccullough that if she developed black stools then she should go to the ED.  Patient came to the hospital for further evaluation. ? ? ?Subjective  ? ?Patient seen and examined, capsule study was negative for any bleeding as per Dr. Benson Mccullough.  Recommends to discontinue Pletal and continue with Plavix only. ? ? Assessment/Plan:  ? ? ?GI bleed ?-FOBT positive ?-Gastroenterology consulted, plan for small bowel enteroscopy/capsule study to check for AVMs in small bowel ?-She was started on  octreotide 50 mcg subcu 3 times daily as per GI recommendation, however patient experienced chest discomfort with heartburn and has refused to take octreotide. ?-Patient is on IV Protonix 40 mg every 12 hours ?-At home she takes Dexilant ?-Dr. Benson Mccullough recommends to discontinue Pletal at discharge ?-Continue Plavix only ?-Capsule study was unremarkable ? ?Anemia, due to above ?-Hemoglobin down to 8.2 this morning ?-Follow CBC in a.m. and transfuse for hemoglobin less than 7 ? ?Acute kidney  injury ?-Creatinine has improved to 1.51 ? ?Chronic diastolic CHF ?-Echocardiogram showed EF of 60 to 65% with grade 1 diastolic dysfunction ?-Patient is euvolemic at this time ?-Lasix and irbesartan on hold due to acute kidney injury ? ?S/p carotid endarterectomy ?-S/p right carotid endarterectomy with bovine pericardial patch angioplasty on 09/26/2021.  Plavix/Pletal currently on hold due to GI bleed. ?-We will discontinue Pletal and continue with Plavix only ? ?Peripheral vascular disease ?-Continue Plavix, Lipitor ? ?Hypertension ?-Benicar on hold ?-Continue hydralazine as needed ? ?History of TIA ?-Continue Plavix on discharge ? ?COPD ?-Not in exacerbation ? ?Medications ? ?  ? fluticasone furoate-vilanterol  1 puff Inhalation Daily  ? And  ? umeclidinium bromide  1 puff Inhalation Daily  ? octreotide  50 mcg Subcutaneous Q8H  ? pantoprazole (PROTONIX) IV  40 mg Intravenous Q12H  ? ? ? Data Reviewed:  ? ?CBG: ? ?No results for input(s): GLUCAP in the last 168 hours. ? ?SpO2: 98 %  ? ? ?Vitals:  ? 12/26/21 0726 12/26/21 1236 12/26/21 1248 12/26/21 1342  ?BP:  (!) 180/81 (!) 160/58 (!) 153/58  ?Pulse:  (!) 54 (!) 56 (!) 54  ?Resp:      ?Temp:  (!) 97.5 ?F (36.4 ?C)    ?TempSrc:  Axillary    ?SpO2: 93% 98%    ?Weight:      ?Height:      ? ? ? ? ?Data Reviewed: ? ?Basic Metabolic Panel: ?Recent Labs  ?Lab 12/23/21 ?1214 12/24/21 ?0559 12/25/21 ?0240 12/26/21 ?0540  ?NA 136 142 139 141  ?K 4.3 4.2 4.5 4.5  ?CL 104 109 109 109  ?CO2 23 26 25 26   ?GLUCOSE 142*  95 103* 91  ?BUN 38* 33* 26* 21  ?CREATININE 2.05* 1.77* 1.72* 1.51*  ?CALCIUM 9.2 9.1 8.6* 8.6*  ?MG  --   --   --  2.3  ? ? ?CBC: ?Recent Labs  ?Lab 12/23/21 ?1214 12/24/21 ?0559 12/24/21 ?2004 12/25/21 ?0240 12/25/21 ?0757 12/25/21 ?1353 12/26/21 ?0540  ?WBC 7.4 4.8  --   --   --   --  5.5  ?HGB 8.2* 6.7* 8.7* 8.3* 8.6* 9.1* 8.2*  ?HCT 25.6* 21.4* 26.5* 25.6* 26.7* 27.9* 25.5*  ?MCV 93.8 95.1  --   --   --   --  95.5  ?PLT 391 302  --   --   --   --  265   ? ? ?LFT ?Recent Labs  ?Lab 12/23/21 ?1214 12/24/21 ?0559  ?AST 17 15  ?ALT 11 10  ?ALKPHOS 74 65  ?BILITOT 0.4 0.5  ?PROT 7.2 6.1*  ?ALBUMIN 3.6 3.1*  ? ?  ?Antibiotics: ?Anti-infectives (From admission, onward)  ? ? None  ? ?  ? ? ? ?DVT prophylaxis: SCDs ? ?Code Status: Full code ? ?Family Communication: No family at bedside ? ? ?CONSULTS gastroenterology ? ? ?Objective  ? ? ?Physical Examination: ? ? ?General-appears in no acute distress ?Heart-S1-S2, regular, no murmur auscultated ?Lungs-clear to auscultation bilaterally, no wheezing or crackles auscultated ?Abdomen-soft, nontender, no organomegaly ?Extremities-no edema in the lower extremities ?Neuro-alert, oriented x3, no focal deficit noted ? ?Status is: Inpatient:   ? ? ?  ? ? ?[name] ?  ?[facility] ?If 7PM-7AM, please contact night-coverage at [email or web address], ?Office  [phone] ? ? ?12/26/2021, 4:45 PM  LOS: 2 days  ? ? ? ? ? ? ? ? ? ? ?  ?

## 2021-12-27 DIAGNOSIS — K625 Hemorrhage of anus and rectum: Secondary | ICD-10-CM

## 2021-12-27 LAB — BASIC METABOLIC PANEL
Anion gap: 6 (ref 5–15)
BUN: 17 mg/dL (ref 8–23)
CO2: 24 mmol/L (ref 22–32)
Calcium: 8.9 mg/dL (ref 8.9–10.3)
Chloride: 112 mmol/L — ABNORMAL HIGH (ref 98–111)
Creatinine, Ser: 1.33 mg/dL — ABNORMAL HIGH (ref 0.44–1.00)
GFR, Estimated: 41 mL/min — ABNORMAL LOW (ref >60)
Glucose, Bld: 88 mg/dL (ref 70–99)
Potassium: 4.4 mmol/L (ref 3.5–5.1)
Sodium: 142 mmol/L (ref 135–145)

## 2021-12-27 LAB — CBC
HCT: 25.4 % — ABNORMAL LOW (ref 36.0–46.0)
Hemoglobin: 8.1 g/dL — ABNORMAL LOW (ref 12.0–15.0)
MCH: 30.8 pg (ref 26.0–34.0)
MCHC: 31.9 g/dL (ref 30.0–36.0)
MCV: 96.6 fL (ref 80.0–100.0)
Platelets: 274 10*3/uL (ref 150–400)
RBC: 2.63 MIL/uL — ABNORMAL LOW (ref 3.87–5.11)
RDW: 14.7 % (ref 11.5–15.5)
WBC: 5.7 10*3/uL (ref 4.0–10.5)
nRBC: 0 % (ref 0.0–0.2)

## 2021-12-27 NOTE — Discharge Summary (Signed)
?Physician Discharge Summary ?  ?Patient: KHRYSTYNA SCHWALM MRN: [id number] DOB: [birth year]  ?Admit date:     12/23/2021  ?Discharge date: 12/27/21  ?Discharge Physician: Marylu Lund  ? ?PCP: [name], MD  ? ?Recommendations at discharge:  ? ? Follow up with PCP in 1-2 weeks ?Follow up with Dr. Terrence Dupont as scheduled ? ?Discharge Diagnoses: ?Active Problems: ?  GI bleed ? ?Resolved Problems: ?  * No resolved hospital problems. * ? ?Hospital Course: ?81 y.o. female, with medical history of common iliac artery aneurysm, aortoiliac occlusive disease, Arnold-Chiari malformation, asthma/COPD, back bilateral occipital neuralgia, carotid artery stenosis, stage III CKD, history of CAD s/p CABG x3, diverticulosis, GERD, herpes zoster, right eye glaucoma, hyperlipidemia, lung cancer s/p radiation in 2018, peripheral vascular disease with lower extremity stent placement who was recently discharged from the hospital on 12/05/2021 after she was treated for melena.  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  GI recommended to restart taking Plavix and Pletal. ?Patient says that after she started taking Plavix she noticed black stool.  Patient was told by Dr. Benson Norway that if she developed black stools then she should go to the ED.  Patient came to the hospital for further evaluation. ? ?Assessment and Plan: ?No notes have been filed under this hospital service. ?Service: Hospitalist ?GI bleed ?-FOBT positive ?-Gastroenterology consulted, underwent capsule study which was found to be neg ?-She was started on  octreotide 50 mcg subcu 3 times daily as per GI recommendations ?-Patient was continued on Protonix 40 mg every 12 hours ?-On Dexilant at home ?-Dr. Benson Norway recommends to discontinue Pletal at discharge ?-Continue Plavix only ?-Capsule study was unremarkable ?  ?Anemia, due to above ?-Hemoglobin remained stable  remainder of course ?  ?Acute kidney injury ?-Creatinine has improved to 1.33 ?  ?Chronic diastolic CHF ?-Echocardiogram showed EF of 60 to 65% with grade 1 diastolic dysfunction ?-Patient is euvolemic at this time ?-Lasix and irbesartan initially held due to acute kidney injury, resume on d/c ?  ?S/p carotid endarterectomy ?-S/p right carotid endarterectomy with bovine pericardial patch angioplasty on 09/26/2021.  Plavix/Pletal currently on hold due to GI bleed. ?-We will discontinue Pletal and continue with Plavix only at time of d/c per GI ?  ?Peripheral vascular disease ?-Continue Plavix, Lipitor ?  ?Hypertension ?-Benicar on hold ?-Continue hydralazine as needed ?  ?History of TIA ?-Continue Plavix on discharge ?  ?COPD ?-Not in exacerbation ? ?Tanna Furry ?-tele reviewed with Cardiology. Seven runs of wide complex tachycarrdia noted on tele 5/17 ?-Pt had remained completely asymptomatic ?-Discussed with pt's primary Cardiologist, Dr. Terrence Dupont, who is aware. Recommends no further intervention at this time. Cardiology to follow up closely after discharge ?-Electrolytes reviewed, noted to be stable ?  ? ?  ? ? ?Consultants: GI, Discussed case with Cardiology ?Procedures performed: capsule endoscopy  ?Disposition: Home ?Diet recommendation:  ?Cardiac diet ?DISCHARGE MEDICATION: ?Allergies as of 12/27/2021   ? ?   Reactions  ? Avelox [moxifloxacin Hcl In Nacl] Palpitations, Other (See Comments)  ? Caused Heart Attack   ? Azithromycin Swelling, Other (See Comments)  ? Patient reported past history of lip swelling  ? Codeine Other (See Comments)  ? Dr. Terrence Dupont advised patient not to take this medication  ? Doxycycline Swelling, Other (See Comments)  ? Mouth, lips, feet swelling  ? Hydromorphone Palpitations, Other (See Comments)  ? DILAUDID  -  Pt had  a Heart Attack after taking Dilaudid.  ? Levaquin [levofloxacin] Shortness Of Breath, Other (See Comments)  ? Chest pressure, SOB, "pain in between shoulder blades", sweaty -as  reported by patient per experience in ED this afternoon  ? Vitamin D Analogs Swelling, Other (See Comments)  ? Face and lips swell, but no breathing issues  ? Nifedipine Er Other (See Comments)  ? Dropped the heart rate too low  ? Octreotide   ? Chest pain, N/V  ? Oxycodone-acetaminophen Other (See Comments)  ? Says it makes her "feel weird"  ? Risedronate Other (See Comments)  ? Chest pain  ? ?  ? ?  ?Medication List  ?  ? ?STOP taking these medications   ? ?cilostazol 100 MG tablet ?Commonly known as: PLETAL ?  ? ?  ? ?TAKE these medications   ? ?acetaminophen 500 MG tablet ?Commonly known as: TYLENOL ?Take 500-1,000 mg by mouth every 6 (six) hours as needed (for pain). ?  ?albuterol 108 (90 Base) MCG/ACT inhaler ?Commonly known as: VENTOLIN HFA ?Inhale 2 puffs into the lungs every 6 (six) hours as needed for wheezing or shortness of breath. ?  ?atorvastatin 40 MG tablet ?Commonly known as: LIPITOR ?Take 1 tablet (40 mg total) by mouth at bedtime. ?  ?cetirizine 10 MG tablet ?Commonly known as: ZYRTEC ?Take 10 mg by mouth daily as needed for allergies. ?  ?clopidogrel 75 MG tablet ?Commonly known as: PLAVIX ?Take 1 tablet (75 mg total) by mouth daily. ?What changed:  ?how much to take ?when to take this ?  ?dexlansoprazole 60 MG capsule ?Commonly known as: Dexilant ?Take 1 capsule (60 mg total) by mouth daily before breakfast. ?  ?furosemide 40 MG tablet ?Commonly known as: LASIX ?Take 40 mg by mouth in the morning. ?  ?Magnesium Oxide 400 MG Caps ?Take 400 mg by mouth in the morning. ?  ?nitroGLYCERIN 0.4 MG SL tablet ?Commonly known as: NITROSTAT ?Place 0.4 mg under the tongue every 5 (five) minutes x 3 doses as needed for chest pain. ?What changed: Another medication with the same name was removed. Continue taking this medication, and follow the directions you see here. ?  ?olmesartan 20 MG tablet ?Commonly known as: Benicar ?Take 1 tablet (20 mg total) by mouth daily. ?What changed:  ?how much to take ?when to  take this ?  ?ondansetron 4 MG disintegrating tablet ?Commonly known as: ZOFRAN-ODT ?Take 4 mg by mouth every 8 (eight) hours as needed for nausea or vomiting (dissolve in the mouth). ?  ?Trelegy Ellipta 200-62.5-25 MCG/ACT Aepb ?Generic drug: Fluticasone-Umeclidin-Vilant ?Inhale 1 puff into the lungs daily. ?  ? ?  ? ? Follow-up Information   ? ? [name], MD Follow up in 2 week(s).   ?Specialty: Family Medicine ?Why: Hospital follow up ?Contact information: ?[address] ?[address] ?[facility] ?[place] [address] ?[phone] ? ? ?  ?  ? ? Charolette Forward, MD Follow up.   ?Specialty: Cardiology ?Why: as scheduled ?Contact information: ?[address] ?[address] ?[place] [address] ?[phone] ? ? ?  ?  ? ?  ?  ? ?  ? ?Discharge Exam: ?Filed Weights  ? 12/23/21 1147 12/23/21 2003 12/25/21 1206  ?Weight: 67.6 kg 69.9 kg 67.6 kg  ? ?General exam: Awake, laying in bed, in nad ?Respiratory system: Normal respiratory effort, no wheezing ?Cardiovascular system: regular rate, s1, s2 ?Gastrointestinal system: Soft, nondistended, positive BS ?Central nervous system: CN2-12 grossly intact, strength intact ?Extremities:  Perfused, no clubbing ?Skin: Normal skin turgor, no notable skin lesions seen ?Psychiatry: Mood normal // no visual hallucinations  ? ?Condition at discharge: fair ? ?The results of significant diagnostics from this hospitalization (including imaging, microbiology, ancillary and laboratory) are listed below for reference.  ? ?Imaging Studies: ?CT Abdomen Pelvis Wo Contrast ? ?Result Date: 12/23/2021 ?CLINICAL DATA:  Rectal bleeding.  Abdominal pain. EXAM: CT ABDOMEN AND PELVIS WITHOUT CONTRAST TECHNIQUE: Multidetector CT imaging of the abdomen and pelvis was performed following the standard protocol without IV contrast. RADIATION DOSE REDUCTION: This exam was performed according to the departmental dose-optimization program which includes automated exposure control, adjustment  of the mA and/or kV according to patient size and/or use of iterative reconstruction technique. COMPARISON:  None Available. FINDINGS: Lower chest: No acute abnormality. Hepatobiliary: No focal liver

## 2021-12-27 NOTE — Hospital Course (Signed)
81 y.o. female, with medical history of common iliac artery aneurysm, aortoiliac occlusive disease, Arnold-Chiari malformation, asthma/COPD, back bilateral occipital neuralgia, carotid artery stenosis, stage III CKD, history of CAD s/p CABG x3, diverticulosis, GERD, herpes zoster, right eye glaucoma, hyperlipidemia, lung cancer s/p radiation in 2018, peripheral vascular disease with lower extremity stent placement who was recently discharged from the hospital on 12/05/2021 after she was treated for melena.  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  GI recommended to restart taking Plavix and Pletal. ?Patient says that after she started taking Plavix she noticed black stool.  Patient was told by Dr. Benson Norway that if she developed black stools then she should go to the ED.  Patient came to the hospital for further evaluation. ?

## 2021-12-27 NOTE — Progress Notes (Signed)
Discharge order received and AVS med details added. Patient's IV removed and guaze dressing placed. AVS given to patient and all discharge instructions and medications reviewed and all questions answered. Reviewed with patient that she is to stop taking her Pletal. She is to follow up with her cardiologist in 2 days and her primary care in 1 week. Patient states understanding and was wheel to her ride and helped in to the car. ?

## 2021-12-27 NOTE — Care Management Important Message (Signed)
Important Message ? ?Patient Details IM Letter given to the Patient. ?Name: [name] ?MRN: [id number] ?Date of Birth: 1941/02/23 ? ? ?Medicare Important Message Given:  Yes ? ? ? ? ?Kerin Salen ?12/27/2021, 9:00 AM ?

## 2021-12-29 ENCOUNTER — Telehealth: Payer: Self-pay | Admitting: *Deleted

## 2021-12-29 NOTE — Telephone Encounter (Signed)
Patient called asking if ok for her to discontinue Plavix for up coming carpal tunnel surgery.  Instructed patient to have provider to fax a form stating surgery date and type.  Patient voiced understanding of the instructions.

## 2022-01-29 ENCOUNTER — Telehealth: Payer: Self-pay

## 2022-01-29 NOTE — Telephone Encounter (Signed)
Patient called to advise that her PCP drew labs and her iron level is 11.

## 2022-01-30 ENCOUNTER — Telehealth: Payer: Self-pay

## 2022-01-30 NOTE — Telephone Encounter (Signed)
Called and LM for patient to call back regarding her need for an iron infusion.

## 2022-01-30 NOTE — Telephone Encounter (Signed)
Patient returned my call to get her scheduled for lab next week. I informed her that Dr. Julien Nordmann would like her ferritin lab to be drawn before determining if she needs an iron infusion. Patient was agreeable to this and informed me she can come in on Tuesday June 27th. A message was sent to scheduling. No further questions or concerns at this time.

## 2022-01-31 ENCOUNTER — Telehealth: Payer: Self-pay | Admitting: Internal Medicine

## 2022-01-31 NOTE — Telephone Encounter (Signed)
.  Called patient to schedule appointment per 6/20 inbasket, patient is aware of date and time.

## 2022-02-02 ENCOUNTER — Telehealth: Payer: Self-pay | Admitting: Internal Medicine

## 2022-02-06 ENCOUNTER — Other Ambulatory Visit: Payer: Self-pay

## 2022-02-06 ENCOUNTER — Inpatient Hospital Stay: Payer: Medicare Other | Attending: Internal Medicine

## 2022-02-06 DIAGNOSIS — K921 Melena: Secondary | ICD-10-CM | POA: Diagnosis not present

## 2022-02-06 DIAGNOSIS — N189 Chronic kidney disease, unspecified: Secondary | ICD-10-CM | POA: Diagnosis not present

## 2022-02-06 DIAGNOSIS — Z90710 Acquired absence of both cervix and uterus: Secondary | ICD-10-CM | POA: Insufficient documentation

## 2022-02-06 DIAGNOSIS — D509 Iron deficiency anemia, unspecified: Secondary | ICD-10-CM | POA: Diagnosis not present

## 2022-02-06 DIAGNOSIS — C3431 Malignant neoplasm of lower lobe, right bronchus or lung: Secondary | ICD-10-CM | POA: Diagnosis not present

## 2022-02-06 DIAGNOSIS — I129 Hypertensive chronic kidney disease with stage 1 through stage 4 chronic kidney disease, or unspecified chronic kidney disease: Secondary | ICD-10-CM | POA: Diagnosis not present

## 2022-02-06 DIAGNOSIS — Z923 Personal history of irradiation: Secondary | ICD-10-CM | POA: Insufficient documentation

## 2022-02-06 DIAGNOSIS — D500 Iron deficiency anemia secondary to blood loss (chronic): Secondary | ICD-10-CM

## 2022-02-06 DIAGNOSIS — J449 Chronic obstructive pulmonary disease, unspecified: Secondary | ICD-10-CM | POA: Diagnosis not present

## 2022-02-06 DIAGNOSIS — Z7902 Long term (current) use of antithrombotics/antiplatelets: Secondary | ICD-10-CM | POA: Insufficient documentation

## 2022-02-06 LAB — CBC WITH DIFFERENTIAL (CANCER CENTER ONLY)
Abs Immature Granulocytes: 0.01 10*3/uL (ref 0.00–0.07)
Basophils Absolute: 0.1 10*3/uL (ref 0.0–0.1)
Basophils Relative: 1 %
Eosinophils Absolute: 0.4 10*3/uL (ref 0.0–0.5)
Eosinophils Relative: 6 %
HCT: 27 % — ABNORMAL LOW (ref 36.0–46.0)
Hemoglobin: 8.7 g/dL — ABNORMAL LOW (ref 12.0–15.0)
Immature Granulocytes: 0 %
Lymphocytes Relative: 35 %
Lymphs Abs: 2.3 10*3/uL (ref 0.7–4.0)
MCH: 28.8 pg (ref 26.0–34.0)
MCHC: 32.2 g/dL (ref 30.0–36.0)
MCV: 89.4 fL (ref 80.0–100.0)
Monocytes Absolute: 0.5 10*3/uL (ref 0.1–1.0)
Monocytes Relative: 8 %
Neutro Abs: 3.3 10*3/uL (ref 1.7–7.7)
Neutrophils Relative %: 50 %
Platelet Count: 320 10*3/uL (ref 150–400)
RBC: 3.02 MIL/uL — ABNORMAL LOW (ref 3.87–5.11)
RDW: 13.5 % (ref 11.5–15.5)
WBC Count: 6.5 10*3/uL (ref 4.0–10.5)
nRBC: 0 % (ref 0.0–0.2)

## 2022-02-06 LAB — CMP (CANCER CENTER ONLY)
ALT: 6 U/L (ref 0–44)
AST: 13 U/L — ABNORMAL LOW (ref 15–41)
Albumin: 3.9 g/dL (ref 3.5–5.0)
Alkaline Phosphatase: 82 U/L (ref 38–126)
Anion gap: 6 (ref 5–15)
BUN: 33 mg/dL — ABNORMAL HIGH (ref 8–23)
CO2: 28 mmol/L (ref 22–32)
Calcium: 9.6 mg/dL (ref 8.9–10.3)
Chloride: 104 mmol/L (ref 98–111)
Creatinine: 1.52 mg/dL — ABNORMAL HIGH (ref 0.44–1.00)
GFR, Estimated: 35 mL/min — ABNORMAL LOW (ref >60)
Glucose, Bld: 106 mg/dL — ABNORMAL HIGH (ref 70–99)
Potassium: 4.6 mmol/L (ref 3.5–5.1)
Sodium: 138 mmol/L (ref 135–145)
Total Bilirubin: 0.3 mg/dL (ref 0.3–1.2)
Total Protein: 7.1 g/dL (ref 6.5–8.1)

## 2022-02-06 LAB — IRON AND IRON BINDING CAPACITY (CC-WL,HP ONLY)
Iron: 30 ug/dL (ref 28–170)
Saturation Ratios: 8 % — ABNORMAL LOW (ref 10.4–31.8)
TIBC: 384 ug/dL (ref 250–450)
UIBC: 354 ug/dL (ref 148–442)

## 2022-02-06 LAB — FERRITIN: Ferritin: 8 ng/mL — ABNORMAL LOW (ref 11–307)

## 2022-02-06 NOTE — Progress Notes (Signed)
[name] OFFICE PROGRESS NOTE  [name], MD [name] [name] [address]  DIAGNOSIS:  1) stage IA non-small cell lung cancer, squamous cell carcinoma presented with right lower lobe pulmonary nodule. 2) persistent anemia questionable for anemia of chronic disease +/-iron deficiency.  PRIOR THERAPY: 1) Feraheme infusion last dose was given 2021 2) stereotactic radiotherapy to the right lower lobe lung nodule under the care of Dr. Tammi Klippel  CURRENT THERAPY: IV iron as needed with Venofer 300 mg.  Next dose expected on 02/14/2022  INTERVAL HISTORY: [name] 81 y.o. female returns to the clinic today for a follow-up visit accompanied by her daughter.  The patient was last seen in clinic on 08/31/2021.  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  Regarding her history of lung cancer, though we are monitoring a right upper lobe anterior lung nodule with groundglass calcification.  She is feeling fairly well from that aspect except she has baseline dyspnea on exertion which gets worse when she has worsening anemia.  She is scheduled to see Dr. Valeta Harms next month on 02/21/2022.  We are scheduled to see the patient next month to review her CT scan.  Dr. Julien Nordmann recommended a repeat CT scan in 6 months, if this area continues to grow Dr. Julien Nordmann discussed options would include biopsy versus SBRT.  The order for the CT without contrast was placed on 08/31/2021.  It appears that the order was released and linked to the date of 09/06/2021.  Therefore, when the patient tried to call to schedule the scan the radiology department told her the order was no longer visible.  We will follow-up on this.  The patient mentions that over the last 1 to 2 months she reports her ribs are sore on the right side when she lays on the right.  She denies any overlying skin changes, lifting heavy  objects, recent falls or injuries.  She denies any hemoptysis.  She thinks she may be coughing more mucus recently.  She also sneezing more and she has been taking Zyrtec.  She uses Trelegy for her inhaler.    Regarding her anemia, the patient has a history of bleeding AVMs and she follows closely with Dr. Benson Norway.  She was hospitalized in April 2023 with a chief complaint of melena.  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  She reports that her stool is "always" dark.  She is scheduled to see Dr. Benson Norway on 02/14/2022.    She is currently on Plavix and was on pretal, which was recently discontinued.  He has a history of bilateral carotid endarterectomies and surgery for Chiari malformation years ago.  She was treated in April 2023 for possible TIA.   She then represented to the hospital for possible GI bleed and was admitted from 5/13 to 12/27/2021.  The patient had FOBT testing that was positive.  She underwent capsule endoscopy which was negative.  She is now only on Plavix.  She then followed up with her PCP recently who noted iron deficiency.  She called the clinic and had repeat blood work performed on 02/06/2022 which showed iron deficiency anemia.  She is here today for evaluation and consideration of IV iron infusions.  She is not taking an oral iron supplement because she is  reportedly allergic to it.  She reports occasional lightheadedness without syncope.  She denies any other abnormal bleeding or bruising including epistaxis, gingival bleeding, hemoptysis, or hematuria.  She reports low energy/fatigue.   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   Asthma    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   Blood in stool    last week of aug 2018    CAP (community acquired pneumonia) 10/22/2015   Chronic kidney disease    COPD (chronic obstructive pulmonary disease) (H[facility])    Coronary artery disease    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   Diverticulitis    Dyspnea    with exertion   Gastroesophageal reflux disease    occ   Glaucoma    right eye   Headache syndrome 11/27/2018   Hiatal hernia    History of shingles 06/23/2018   Hyperlipidemia    Hypertension    Lung cancer ([place]) dx 2018   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   Myocardial infarction ([place]) 01/01/2000   Cardiac catheterization   Peripheral vascular disease ([place])    stents in legs x 2 or 3   Pneumonia    last time winter 2017 -2018   PONV (postoperative nausea and vomiting)    occassionally, last colonscopy did ok with anesthesia   Primary cancer of right lower lobe of lung ([place]) 04/25/2016   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  MEDICATIONS:  Current Outpatient Medications  Medication Sig Dispense Refill   acetaminophen (TYLENOL) 500 MG tablet Take 500-1,000 mg by mouth every 6 (six) hours as needed (for pain).     albuterol (VENTOLIN HFA) 108 (90 Base) MCG/ACT inhaler Inhale 2 puffs into the lungs every 6 (six) hours as needed for wheezing or shortness of breath. 8 g 6   atorvastatin (LIPITOR) 40 MG tablet Take 1 tablet (40 mg total) by mouth at bedtime. 30 tablet 6   cetirizine (ZYRTEC) 10 MG tablet Take 10 mg by mouth daily as needed for allergies.      clopidogrel (PLAVIX) 75 MG tablet Take 1 tablet (75 mg total) by mouth daily. (Patient taking differently: Take 37.5 mg by mouth in the morning.) 90 tablet 3    dexlansoprazole (DEXILANT) 60 MG capsule Take 1 capsule (60 mg total) by mouth daily before breakfast. 30 capsule 11   Fluticasone-Umeclidin-Vilant (TRELEGY ELLIPTA) 200-62.5-25 MCG/ACT AEPB Inhale 1 puff into the lungs daily. 180 each 3   furosemide (LASIX) 40 MG tablet Take 40 mg by mouth in the morning.     Magnesium Oxide 400 MG CAPS Take 400 mg by mouth in the morning.     nitroGLYCERIN (NITROSTAT) 0.4 MG SL tablet Place 0.4 mg under the tongue every 5 (five) minutes x 3 doses as needed for chest pain.     olmesartan (BENICAR) 20 MG tablet Take 1 tablet (20 mg total) by mouth daily. (Patient taking differently: Take 10 mg by mouth in the morning.) 90 tablet 1   ondansetron (ZOFRAN-ODT) 4 MG disintegrating tablet Take 4 mg by mouth every 8 (eight) hours as needed  for nausea or vomiting (dissolve in the mouth).     No current facility-administered medications for this visit.    SURGICAL HISTORY:  Past Surgical History:  Procedure Laterality Date   ABDOMINAL HYSTERECTOMY     APPENDECTOMY     Arnold-chiari malformation repair  1998   Suboccipital craniectomy   CAROTID ENDARTERECTOMY  03/29/2010   Left  CEA   CHOLECYSTECTOMY     Gall Bladder   COLONOSCOPY WITH PROPOFOL N/A 04/22/2015   Procedure: COLONOSCOPY WITH PROPOFOL;  Surgeon: Carol Ada, MD;  Location: [facility];  Service: Endoscopy;  Laterality: N/A;   COLONOSCOPY WITH PROPOFOL N/A 05/25/2016   Procedure: COLONOSCOPY WITH PROPOFOL;  Surgeon: Carol Ada, MD;  [facility]: [facility];  Service: Endoscopy;  Laterality: N/A;   COLONOSCOPY WITH PROPOFOL N/A 05/03/2017   Procedure: COLONOSCOPY WITH PROPOFOL;  Surgeon: Carol Ada, MD;  [facility];  Service: Endoscopy;  Laterality: N/A;   COLONOSCOPY WITH PROPOFOL N/A 06/29/2020   Procedure: COLONOSCOPY WITH PROPOFOL;  Surgeon: Carol Ada, MD;  [facility];  Service: Endoscopy;  Laterality: N/A;   COLONOSCOPY WITH PROPOFOL N/A 01/05/2021   Procedure:  COLONOSCOPY WITH PROPOFOL;  Surgeon: Carol Ada, MD;  [facility];  Service: Endoscopy;  Laterality: N/A;   COLONOSCOPY WITH PROPOFOL N/A 09/28/2021   Procedure: COLONOSCOPY WITH PROPOFOL;  Surgeon: Carol Ada, MD;  Location: [facility];  Service: Endoscopy;  Laterality: N/A;   CORNEAL TRANSPLANT     Right   CORONARY ARTERY BYPASS GRAFT  01/01/2000   x 3   ENDARTERECTOMY Right 09/26/2021   Procedure: RIGHT CAROTID ENDARTERECTOMY;  Surgeon: [name], MD;  Location: [facility];  Service: Vascular;  Laterality: Right;   ENTEROSCOPY N/A 02/27/2018   Procedure: ENTEROSCOPY;  Surgeon: Carol Ada, MD;  [facility];  Service: Endoscopy;  Laterality: N/A;   ENTEROSCOPY N/A 07/18/2018   Procedure: ENTEROSCOPY;  Surgeon: Carol Ada, MD;  [facility];  Service: Endoscopy;  Laterality: N/A;   ENTEROSCOPY N/A 06/29/2020   Procedure: ENTEROSCOPY;  Surgeon: Carol Ada, MD;  [facility];  Service: Endoscopy;  Laterality: N/A;   ENTEROSCOPY N/A 12/05/2021   Procedure: ENTEROSCOPY;  Surgeon: Carol Ada, MD;  [facility];  Service: Gastroenterology;  Laterality: N/A;   ENTEROSCOPY N/A 12/25/2021   Procedure: ENTEROSCOPY;  Surgeon: Carol Ada, MD;  [facility];  Service: Gastroenterology;  Laterality: N/A;  with endoscopic placement of video capsule   ESOPHAGOGASTRODUODENOSCOPY N/A 05/25/2016   Procedure: ESOPHAGOGASTRODUODENOSCOPY (EGD);  Surgeon: Carol Ada, MD;  Location[facility];  Service: Endoscopy;  Laterality: N/A;   ESOPHAGOGASTRODUODENOSCOPY N/A 08/01/2018   Procedure: ESOPHAGOGASTRODUODENOSCOPY (EGD);  Surgeon: Carol Ada, MD;  Location[facility];  Service: Endoscopy;  Laterality: N/A;   ESOPHAGOGASTRODUODENOSCOPY (EGD) WITH PROPOFOL N/A 09/28/2021   Procedure: ESOPHAGOGASTRODUODENOSCOPY (EGD) WITH PROPOFOL;  Surgeon: Carol Ada, MD;  Location: [facility];  Service: Endoscopy;  Laterality: N/A;    EYE SURGERY Right 1995 or 1996   Laser surgery for retinal hemorrhage   GIVENS CAPSULE STUDY N/A 07/16/2018   Procedure: GIVENS CAPSULE STUDY;  Surgeon: Carol Ada, MD;  [facility];  Service: Endoscopy;  Laterality: N/A;   GIVENS CAPSULE STUDY N/A 12/14/2021   Procedure: GIVENS CAPSULE STUDY;  Surgeon: Carol Ada, MD;  Location: [facility];  Service: Gastroenterology;  Laterality: N/A;   GIVENS CAPSULE STUDY N/A 12/25/2021   Procedure: GIVENS CAPSULE STUDY;  Surgeon: Carol Ada, MD;  [facility];  Service: Gastroenterology;  Laterality: N/A;  HEMOSTASIS CLIP PLACEMENT  06/29/2020   Procedure: HEMOSTASIS CLIP PLACEMENT;  Surgeon: Carol Ada, MD;  [facility];  Service: Endoscopy;;   HEMOSTASIS CLIP PLACEMENT  01/05/2021   Procedure: HEMOSTASIS CLIP PLACEMENT;  Surgeon: Carol Ada, MD;  [facility];  Service: Endoscopy;;   HOT HEMOSTASIS N/A 02/27/2018   Procedure: HOT HEMOSTASIS (ARGON PLASMA COAGULATION/BICAP);  Surgeon: Carol Ada, MD;  Location[facility];  Service: Endoscopy;  Laterality: N/A;   HOT HEMOSTASIS N/A 08/01/2018   Procedure: HOT HEMOSTASIS (ARGON PLASMA COAGULATION/BICAP);  Surgeon: Carol Ada, MD;  Location: [facility];  Service: Endoscopy;  Laterality: N/A;   HOT HEMOSTASIS N/A 06/29/2020   Procedure: HOT HEMOSTASIS (ARGON PLASMA COAGULATION/BICAP);  Surgeon: Carol Ada, MD;  Location: [facility];  Service: Endoscopy;  Laterality: N/A;   HOT HEMOSTASIS N/A 01/05/2021   Procedure: HOT HEMOSTASIS (ARGON PLASMA COAGULATION/BICAP);  Surgeon: Carol Ada, MD;  Location: [facility];  Service: Endoscopy;  Laterality: N/A;   HOT HEMOSTASIS N/A 12/05/2021   Procedure: HOT HEMOSTASIS (ARGON PLASMA COAGULATION/BICAP);  Surgeon: Carol Ada, MD;  Location: [facility];  Service: Gastroenterology;  Laterality: N/A;   IR RADIOLOGIST EVAL & MGMT  12/14/2016   IR RADIOLOGIST EVAL & MGMT  07/26/2021   LEFT HEART  CATH AND CORS/GRAFTS ANGIOGRAPHY N/A 01/09/2018   Procedure: LEFT HEART CATH AND CORS/GRAFTS ANGIOGRAPHY;  Surgeon: Charolette Forward, MD;  Location: [facility];  Service: Cardiovascular;  Laterality: N/A;   LEFT HEART CATHETERIZATION WITH CORONARY ANGIOGRAM N/A 08/03/2014   Procedure: LEFT HEART CATHETERIZATION WITH CORONARY ANGIOGRAM;  Surgeon: Birdie Riddle, MD;  Location: [facility];  Service: Cardiovascular;  Laterality: N/A;   PATCH ANGIOPLASTY Right 09/26/2021   Procedure: PATCH ANGIOPLASTY RIGHT CAROTID;  Surgeon: [name], MD;  [facility];  Service: Vascular;  Laterality: Right;   Post Coronary Artery  BPG  01/05/2000   Right jugular sheath removed   PR VEIN BYPASS GRAFT,AORTO-FEM-POP     ROTATOR CUFF REPAIR     Right    REVIEW OF SYSTEMS:   Review of Systems  Constitutional: Positive for fatigue.  Negative for appetite change, chills,  fever and unexpected weight change.  HENT: Negative for mouth sores, nosebleeds, sore throat and trouble swallowing.   Eyes: Negative for eye problems and icterus.  Respiratory: Positive for dyspnea on exertion and cough.  Positive for occasional wheezing.  Negative for hemoptysis.  Cardiovascular: Positive for right rib soreness.  Negative for leg swelling.  Gastrointestinal: Positive for melena.  Negative for abdominal pain, constipation, diarrhea, nausea and vomiting.  Genitourinary: Negative for bladder incontinence, difficulty urinating, dysuria, frequency and hematuria.   Musculoskeletal: Negative for back pain, gait problem, neck pain and neck stiffness.  Skin: Negative for itching and rash.  Neurological: Positive for occasional lightheadedness.  Negative for dizziness, extremity weakness, gait problem, headaches,  and seizures.  Hematological: Negative for adenopathy. Does not bruise/bleed easily.  Psychiatric/Behavioral: Negative for confusion, depression and sleep disturbance. The patient is not nervous/anxious.      PHYSICAL EXAMINATION:  Blood pressure (!) 178/68, pulse 67, temperature 97.7 F (36.5 C), resp. rate 16, weight 148 lb 14.4 oz (67.5 kg), SpO2 94 %.  ECOG PERFORMANCE STATUS: 1  Physical Exam  Constitutional: Oriented to person, place, and time and well-developed, well-nourished, and in no distress. HENT:  Head: Normocephalic and atraumatic.  Mouth/Throat: Oropharynx is clear and moist. No oropharyngeal exudate.  Eyes: Conjunctivae are normal. Right eye exhibits no discharge. Left eye exhibits no discharge.  No scleral icterus.  Neck: Normal range of motion. Neck supple.  Cardiovascular: Normal rate, regular rhythm, normal heart sounds and intact distal pulses.   Pulmonary/Chest: Effort normal.  Quiet breath sounds in all lung fields.  No respiratory distress. No wheezes. No rales.  Abdominal: Soft. Bowel sounds are normal. Exhibits no distension and no mass. There is no tenderness.  Musculoskeletal: Tenderness over the right ribs.  Normal range of motion. Exhibits no edema.  Lymphadenopathy:    No cervical adenopathy.  Neurological: Alert and oriented to person, place, and time. Exhibits normal muscle tone. Gait normal. Coordination normal.  Skin: Skin is warm and dry. No rash noted. Not diaphoretic. No erythema. No pallor.  Psychiatric: Mood, memory and judgment normal.  Vitals reviewed.  LABORATORY DATA: Lab Results  Component Value Date   WBC 6.5 02/06/2022   HGB 8.7 (L) 02/06/2022   HCT 27.0 (L) 02/06/2022   MCV 89.4 02/06/2022   PLT 320 02/06/2022      Chemistry      Component Value Date/Time   NA 138 02/06/2022 0851   NA 139 08/02/2017 0821   K 4.6 02/06/2022 0851   K 4.3 08/02/2017 0821   CL 104 02/06/2022 0851   CO2 28 02/06/2022 0851   CO2 24 08/02/2017 0821   BUN 33 (H) 02/06/2022 0851   BUN 14.4 08/02/2017 0821   CREATININE 1.52 (H) 02/06/2022 0851   CREATININE 1.3 (H) 08/02/2017 0821      Component Value Date/Time   CALCIUM 9.6 02/06/2022 0851    CALCIUM 9.2 08/02/2017 0821   ALKPHOS 82 02/06/2022 0851   ALKPHOS 66 08/02/2017 0821   AST 13 (L) 02/06/2022 0851   AST 14 08/02/2017 0821   ALT 6 02/06/2022 0851   ALT 8 08/02/2017 0821   BILITOT 0.3 02/06/2022 0851   BILITOT 0.34 08/02/2017 0821       RADIOGRAPHIC STUDIES:  No results found.   ASSESSMENT/PLAN: This is a very pleasant 81 year old Caucasian female diagnosed with stage Ia non-small cell lung cancer, squamous cell carcinoma.  She initially presented with a right lower lobe pulmonary nodule in 2017.   She is status post SBRT to this lesion under the care of Dr. Tammi Klippel which was completed in 2017.  The patient has been on observation since that time and she is feeling well.   We are monitoring a 1.4 cm solid anterior right upper lobe pulmonary nodule with heterogeneous internal calcifications which has been slowly growing on multiple scans dating back to 2017.  She was last seen in clinic on 08/31/2021.  She was supposed to follow-up CT scan every 6 months which would be in July.  The order was placed on 08/31/2021 but the date is somehow linked to 09/06/2021.  Because of this, the order is not visible to the radiology department.  I told the patient I would follow-up on this with radiology to see if this can be rectified or if I will need to place a new order.  I gave the patient the number to radiology scheduling to call on Monday to schedule her scan.  Because of her rib soreness, I discussed that she can try to move up her scan to the earliest availability.  I offered a chest x-ray today to rule out any fractures.  The patient states that she "knows" that she does not have any rib fractures would like to wait for her CT scan to assess her lung cancer.  For now advised her to use  Tylenol, heating pads, or Salonpas patches if needed.  In the meantime, she had repeat iron studies which did show persistent iron deficiency anemia.  I will arrange for her to receive IV iron  infusions with Venofer 300 mg weekly x3 starting next week.  We will arrange for this to be performed at the [facility] infusion center.  Tylenol is listed as an allergy in her med list.  I clarified with the patient that she is not allergic to Tylenol.  I will remove this from her allergy list.  Patient is not able to tolerate iron supplements due to her allergy. We will see her back for follow-up visit in 1 month.  We will arrange for repeat CBC at this time.  I will send a scheduling message to follow-up on her appointment if there is a big gap between her scan and her next scheduled appointment.  She will see Dr. Valeta Harms as scheduled on 02/21/22 for her COPD management.  She will see Dr. Benson Norway regarding her melena and her history of AVMs.  The patient was advised to call immediately if she has any concerning symptoms in the interval. The patient voices understanding of current disease status and treatment options and is in agreement with the current care plan. All questions were answered. The patient knows to call the clinic with any problems, questions or concerns. We can certainly see the patient much sooner if necessary    Orders Placed This Encounter  Procedures   CT Chest Wo Contrast    Standing Status:   Future    Standing Expiration Date:   02/08/2023    Order Specific Question:   Preferred imaging location?    Answer:   [facility]   CBC with Differential (Cancer Center Only)    Standing Status:   Future    Standing Expiration Date:   02/09/2023   Sample to Blood Bank    Standing Status:   Future    Standing Expiration Date:   02/09/2023     The total time spent in the appointment was 20-29 minutes.   Lakin Romer L Tashonda Pinkus, PA-C 02/08/22

## 2022-02-08 ENCOUNTER — Other Ambulatory Visit: Payer: Self-pay | Admitting: Physician Assistant

## 2022-02-08 ENCOUNTER — Other Ambulatory Visit: Payer: Self-pay

## 2022-02-08 ENCOUNTER — Inpatient Hospital Stay (HOSPITAL_BASED_OUTPATIENT_CLINIC_OR_DEPARTMENT_OTHER): Payer: Medicare Other | Admitting: Physician Assistant

## 2022-02-08 VITALS — BP 178/68 | HR 67 | Temp 97.7°F | Resp 16 | Wt 148.9 lb

## 2022-02-08 DIAGNOSIS — D500 Iron deficiency anemia secondary to blood loss (chronic): Secondary | ICD-10-CM | POA: Diagnosis not present

## 2022-02-08 DIAGNOSIS — C3431 Malignant neoplasm of lower lobe, right bronchus or lung: Secondary | ICD-10-CM | POA: Diagnosis not present

## 2022-02-09 ENCOUNTER — Telehealth: Payer: Self-pay | Admitting: Internal Medicine

## 2022-02-09 NOTE — Telephone Encounter (Signed)
.  Called patient to schedule appointment per 6/29 inbasket, patient is aware of date and time.   

## 2022-02-14 ENCOUNTER — Telehealth: Payer: Self-pay | Admitting: Pharmacy Technician

## 2022-02-14 NOTE — Telephone Encounter (Signed)
Fyi note:  Auth Submission: no auth needed Payer: [employer] MEDICARE Medication & CPT/J Code(s) submitted: Venofer (Iron Sucrose) J1756 Route of submission (phone, fax, portal): PHONE Auth type: Buy/Bill Units/visits requested: X3 DOSES Reference number:  Approval from: 02/14/22 to 08/12/22   Patient will be scheduled as soon as possible

## 2022-02-19 ENCOUNTER — Telehealth: Payer: Self-pay | Admitting: Pharmacy Technician

## 2022-02-19 NOTE — Telephone Encounter (Signed)
I received 2 referral for patient.  Venofer- (entered by [facility] start date 02/15/22) Feraheme (entered by [facility] start date 02/28/22)  Neither require prior auth with her insurance. Please clarify which iron you would like to proceed with.  Thanks Maudie Mercury

## 2022-02-20 ENCOUNTER — Encounter: Payer: Self-pay | Admitting: Internal Medicine

## 2022-02-20 ENCOUNTER — Other Ambulatory Visit: Payer: Self-pay | Admitting: Pharmacy Technician

## 2022-02-20 ENCOUNTER — Encounter: Payer: Self-pay | Admitting: Physician Assistant

## 2022-02-20 NOTE — Telephone Encounter (Signed)
No, only that another referral was entered for [facility].  Just wanted to clarify if  both iron infusions are wanted.

## 2022-02-20 NOTE — Telephone Encounter (Signed)
Thanks. We will get the patient scheduled as soon as possible. [name]

## 2022-02-21 ENCOUNTER — Ambulatory Visit: Payer: Medicare Other | Admitting: Pulmonary Disease

## 2022-02-22 ENCOUNTER — Ambulatory Visit (HOSPITAL_COMMUNITY)
Admission: RE | Admit: 2022-02-22 | Discharge: 2022-02-22 | Disposition: A | Discharge status: Home / Self Care | Payer: Medicare Other | Source: Ambulatory Visit | Attending: Physician Assistant | Admitting: Physician Assistant

## 2022-02-22 DIAGNOSIS — C3431 Malignant neoplasm of lower lobe, right bronchus or lung: Secondary | ICD-10-CM | POA: Diagnosis present

## 2022-02-26 ENCOUNTER — Other Ambulatory Visit: Payer: Commercial Managed Care - HMO

## 2022-02-28 ENCOUNTER — Inpatient Hospital Stay: Payer: Medicare Other

## 2022-02-28 ENCOUNTER — Other Ambulatory Visit: Payer: Self-pay | Admitting: Physician Assistant

## 2022-02-28 ENCOUNTER — Other Ambulatory Visit: Payer: Self-pay

## 2022-02-28 ENCOUNTER — Inpatient Hospital Stay: Payer: Medicare Other | Attending: Internal Medicine | Admitting: Internal Medicine

## 2022-02-28 ENCOUNTER — Other Ambulatory Visit: Payer: Self-pay | Admitting: *Deleted

## 2022-02-28 VITALS — BP 197/56 | HR 68 | Temp 97.1°F | Resp 17 | Wt 152.0 lb

## 2022-02-28 DIAGNOSIS — C3431 Malignant neoplasm of lower lobe, right bronchus or lung: Secondary | ICD-10-CM | POA: Insufficient documentation

## 2022-02-28 DIAGNOSIS — I10 Essential (primary) hypertension: Secondary | ICD-10-CM

## 2022-02-28 DIAGNOSIS — D509 Iron deficiency anemia, unspecified: Secondary | ICD-10-CM | POA: Diagnosis not present

## 2022-02-28 DIAGNOSIS — C3490 Malignant neoplasm of unspecified part of unspecified bronchus or lung: Secondary | ICD-10-CM

## 2022-02-28 DIAGNOSIS — Z79899 Other long term (current) drug therapy: Secondary | ICD-10-CM | POA: Diagnosis not present

## 2022-02-28 DIAGNOSIS — D500 Iron deficiency anemia secondary to blood loss (chronic): Secondary | ICD-10-CM

## 2022-02-28 DIAGNOSIS — Z7902 Long term (current) use of antithrombotics/antiplatelets: Secondary | ICD-10-CM | POA: Insufficient documentation

## 2022-02-28 LAB — CBC WITH DIFFERENTIAL (CANCER CENTER ONLY)
Abs Immature Granulocytes: 0.02 10*3/uL (ref 0.00–0.07)
Basophils Absolute: 0 10*3/uL (ref 0.0–0.1)
Basophils Relative: 1 %
Eosinophils Absolute: 0.5 10*3/uL (ref 0.0–0.5)
Eosinophils Relative: 7 %
HCT: 25.9 % — ABNORMAL LOW (ref 36.0–46.0)
Hemoglobin: 8.1 g/dL — ABNORMAL LOW (ref 12.0–15.0)
Immature Granulocytes: 0 %
Lymphocytes Relative: 31 %
Lymphs Abs: 2.2 10*3/uL (ref 0.7–4.0)
MCH: 27.4 pg (ref 26.0–34.0)
MCHC: 31.3 g/dL (ref 30.0–36.0)
MCV: 87.5 fL (ref 80.0–100.0)
Monocytes Absolute: 0.6 10*3/uL (ref 0.1–1.0)
Monocytes Relative: 9 %
Neutro Abs: 3.8 10*3/uL (ref 1.7–7.7)
Neutrophils Relative %: 52 %
Platelet Count: 312 10*3/uL (ref 150–400)
RBC: 2.96 MIL/uL — ABNORMAL LOW (ref 3.87–5.11)
RDW: 13.5 % (ref 11.5–15.5)
WBC Count: 7.2 10*3/uL (ref 4.0–10.5)
nRBC: 0 % (ref 0.0–0.2)

## 2022-02-28 LAB — SAMPLE TO BLOOD BANK

## 2022-02-28 MED ORDER — CLONIDINE HCL 0.1 MG PO TABS
0.2000 mg | ORAL_TABLET | Freq: Once | ORAL | Status: AC
  Administered 2022-02-28: 0.2 mg via ORAL
  Filled 2022-02-28: qty 2

## 2022-02-28 MED ORDER — CLONIDINE HCL 0.1 MG PO TABS: 0.2000 mg | ORAL_TABLET | Freq: Once | ORAL | Status: DC

## 2022-02-28 NOTE — Progress Notes (Signed)
[facility] [phone]   [phone]  OFFICE PROGRESS NOTE  [name], MD [address] [place] [address]  DIAGNOSIS:  1) stage IA non-small cell lung cancer, squamous cell carcinoma presented with right lower lobe pulmonary nodule. 2) persistent anemia questionable for anemia of chronic disease plus/minus iron deficiency.  PRIOR THERAPY:  1) Feraheme infusion last dose was given 06/20/2016. 2) stereotactic radiotherapy to the right lower lobe lung nodule under the care of Dr. Tammi Klippel.  CURRENT THERAPY: Observation.  INTERVAL HISTORY: [name] 81 y.o. female returns to the clinic today for follow-up visit accompanied by her daughter.  The patient is feeling fine today with no concerning complaints except for fatigue and shortness of breath with exertion.  She also has occasional pain on the right side of the chest.  She has no nausea, vomiting, diarrhea or constipation.  She denied having any headache or visual changes.  She has no cough or hemoptysis.  She is here today for evaluation with repeat blood work as well as CT scan of the chest for restaging of her disease.  She always have high blood pressure when she comes to the clinic and she just took her medication on her way to the clinic today.  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   Asthma    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   Blood in stool    last week of aug 2018   CAP (community acquired pneumonia) 10/22/2015   Chronic kidney disease    COPD (chronic obstructive pulmonary disease) (HCC)    Coronary artery disease    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   Diverticulitis    Dyspnea    with exertion   Gastroesophageal reflux disease    occ   Glaucoma    right eye   Headache syndrome 11/27/2018   Hiatal  hernia    History of shingles 06/23/2018   Hyperlipidemia    Hypertension    Lung cancer ([facility]) dx 2018   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   Myocardial infarction ([place]) 01/01/2000   Cardiac catheterization   Peripheral vascular disease ([place])    stents in legs x 2 or 3   Pneumonia    last time winter 2017 -2018   PONV (postoperative nausea and vomiting)    occassionally, last colonscopy did ok with anesthesia   Primary cancer of right lower lobe of lung ([facility]) 04/25/2016   Right-sided carotid artery disease ([facility]) 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  MEDICATIONS:  Current Outpatient Medications  Medication Sig Dispense Refill   acetaminophen (TYLENOL) 500 MG tablet Take 500-1,000 mg by mouth every 6 (six) hours as needed (for pain).     albuterol (VENTOLIN HFA) 108 (90 Base) MCG/ACT inhaler Inhale 2 puffs into the lungs every 6 (six) hours as needed for wheezing or shortness of breath. 8 g 6   atorvastatin (LIPITOR) 40 MG tablet Take 1 tablet (40 mg total) by mouth at bedtime. 30 tablet 6   cetirizine (ZYRTEC) 10 MG tablet Take 10 mg  by mouth daily as needed for allergies.      clopidogrel (PLAVIX) 75 MG tablet Take 1 tablet (75 mg total) by mouth daily. (Patient taking differently: Take 37.5 mg by mouth in the morning.) 90 tablet 3   dexlansoprazole (DEXILANT) 60 MG capsule Take 1 capsule (60 mg total) by mouth daily before breakfast. 30 capsule 11   Fluticasone-Umeclidin-Vilant (TRELEGY ELLIPTA) 200-62.5-25 MCG/ACT AEPB Inhale 1 puff into the lungs daily. 180 each 3   furosemide (LASIX) 40 MG tablet Take 40 mg by mouth in the morning.     Magnesium Oxide 400 MG CAPS Take 400 mg  by mouth in the morning.     nitroGLYCERIN (NITROSTAT) 0.4 MG SL tablet Place 0.4 mg under the tongue every 5 (five) minutes x 3 doses as needed for chest pain.     olmesartan (BENICAR) 20 MG tablet Take 1 tablet (20 mg total) by mouth daily. (Patient taking differently: Take 10 mg by mouth in the morning.) 90 tablet 1   ondansetron (ZOFRAN-ODT) 4 MG disintegrating tablet Take 4 mg by mouth every 8 (eight) hours as needed for nausea or vomiting (dissolve in the mouth).     No current facility-administered medications for this visit.    SURGICAL HISTORY:  Past Surgical History:  Procedure Laterality Date   ABDOMINAL HYSTERECTOMY     APPENDECTOMY     Arnold-chiari malformation repair  1998   Suboccipital craniectomy   CAROTID ENDARTERECTOMY  03/29/2010   Left  CEA   CHOLECYSTECTOMY     Gall Bladder   COLONOSCOPY WITH PROPOFOL N/A 04/22/2015   Procedure: COLONOSCOPY WITH PROPOFOL;  Surgeon: Carol Ada, MD;  Location: [facility];  Service: Endoscopy;  Laterality: N/A;   COLONOSCOPY WITH PROPOFOL N/A 05/25/2016   Procedure: COLONOSCOPY WITH PROPOFOL;  Surgeon: Carol Ada, MD;  Location: [facility];  Service: Endoscopy;  Laterality: N/A;   COLONOSCOPY WITH PROPOFOL N/A 05/03/2017   Procedure: COLONOSCOPY WITH PROPOFOL;  Surgeon: Carol Ada, MD;  [facility]: [facility];  Service: Endoscopy;  Laterality: N/A;   COLONOSCOPY WITH PROPOFOL N/A 06/29/2020   Procedure: COLONOSCOPY WITH PROPOFOL;  Surgeon: Carol Ada, MD;  [facility];  Service: Endoscopy;  Laterality: N/A;   COLONOSCOPY WITH PROPOFOL N/A 01/05/2021   Procedure: COLONOSCOPY WITH PROPOFOL;  Surgeon: Carol Ada, MD;  [facility];  Service: Endoscopy;  Laterality: N/A;   COLONOSCOPY WITH PROPOFOL N/A 09/28/2021   Procedure: COLONOSCOPY WITH PROPOFOL;  Surgeon: Carol Ada, MD;  Location: [facility];  Service: Endoscopy;  Laterality: N/A;   CORNEAL TRANSPLANT     Right   CORONARY ARTERY BYPASS  GRAFT  01/01/2000   x 3   ENDARTERECTOMY Right 09/26/2021   Procedure: RIGHT CAROTID ENDARTERECTOMY;  Surgeon: [name], MD;  Location: [facility];  Service: Vascular;  Laterality: Right;   ENTEROSCOPY N/A 02/27/2018   Procedure: ENTEROSCOPY;  Surgeon: Carol Ada, MD;  [facility];  Service: Endoscopy;  Laterality: N/A;   ENTEROSCOPY N/A 07/18/2018   Procedure: ENTEROSCOPY;  Surgeon: Carol Ada, MD;  [facility];  Service: Endoscopy;  Laterality: N/A;   ENTEROSCOPY N/A 06/29/2020   Procedure: ENTEROSCOPY;  Surgeon: Carol Ada, MD;  [facility];  Service: Endoscopy;  Laterality: N/A;   ENTEROSCOPY N/A 12/05/2021   Procedure: ENTEROSCOPY;  Surgeon: Carol Ada, MD;  [facility];  Service: Gastroenterology;  Laterality: N/A;   ENTEROSCOPY N/A 12/25/2021   Procedure: ENTEROSCOPY;  Surgeon: Carol Ada, MD;  [facility];  Service: Gastroenterology;  Laterality: N/A;  with endoscopic placement of video capsule   ESOPHAGOGASTRODUODENOSCOPY N/A 05/25/2016   Procedure: ESOPHAGOGASTRODUODENOSCOPY (EGD);  Surgeon: Carol Ada, MD;  Location: [facility];  Service: Endoscopy;  Laterality: N/A;   ESOPHAGOGASTRODUODENOSCOPY N/A 08/01/2018   Procedure: ESOPHAGOGASTRODUODENOSCOPY (EGD);  Surgeon: Carol Ada, MD;  Location: [facility];  Service: Endoscopy;  Laterality: N/A;   ESOPHAGOGASTRODUODENOSCOPY (EGD) WITH PROPOFOL N/A 09/28/2021   Procedure: ESOPHAGOGASTRODUODENOSCOPY (EGD) WITH PROPOFOL;  Surgeon: Carol Ada, MD;  Location: [facility];  Service: Endoscopy;  Laterality: N/A;   EYE SURGERY Right 1995 or 1996   Laser surgery for retinal hemorrhage   GIVENS CAPSULE STUDY N/A 07/16/2018   Procedure: GIVENS CAPSULE STUDY;  Surgeon: Carol Ada, MD;  [facility];  Service: Endoscopy;  Laterality: N/A;   GIVENS CAPSULE STUDY N/A 12/14/2021   Procedure: GIVENS CAPSULE STUDY;  Surgeon: Carol Ada, MD;  Location: [facility];  Service: Gastroenterology;  Laterality: N/A;   GIVENS CAPSULE STUDY N/A 12/25/2021   Procedure: GIVENS CAPSULE STUDY;  Surgeon: Carol Ada, MD;  [facility];  Service: Gastroenterology;  Laterality: N/A;   HEMOSTASIS CLIP PLACEMENT  06/29/2020   Procedure: HEMOSTASIS CLIP PLACEMENT;  Surgeon: Carol Ada, MD;  [facility];  Service: Endoscopy;;   HEMOSTASIS CLIP PLACEMENT  01/05/2021   Procedure: HEMOSTASIS CLIP PLACEMENT;  Surgeon: Carol Ada, MD;  [facility];  Service: Endoscopy;;   HOT HEMOSTASIS N/A 02/27/2018   Procedure: HOT HEMOSTASIS (ARGON PLASMA COAGULATION/BICAP);  Surgeon: Carol Ada, MD;  Location: [facility];  Service: Endoscopy;  Laterality: N/A;   HOT HEMOSTASIS N/A 08/01/2018   Procedure: HOT HEMOSTASIS (ARGON PLASMA COAGULATION/BICAP);  Surgeon: Carol Ada, MD;  Location: [facility];  Service: Endoscopy;  Laterality: N/A;   HOT HEMOSTASIS N/A 06/29/2020   Procedure: HOT HEMOSTASIS (ARGON PLASMA COAGULATION/BICAP);  Surgeon: Carol Ada, MD;  Location: [facility];  Service: Endoscopy;  Laterality: N/A;   HOT HEMOSTASIS N/A 01/05/2021   Procedure: HOT HEMOSTASIS (ARGON PLASMA COAGULATION/BICAP);  Surgeon: Carol Ada, MD;  Location: [facility];  Service: Endoscopy;  Laterality: N/A;   HOT HEMOSTASIS N/A 12/05/2021   Procedure: HOT HEMOSTASIS (ARGON PLASMA COAGULATION/BICAP);  Surgeon: Carol Ada, MD;  Location: [facility];  Service: Gastroenterology;  Laterality: N/A;   IR RADIOLOGIST EVAL & MGMT  12/14/2016   IR RADIOLOGIST EVAL & MGMT  07/26/2021   LEFT HEART CATH AND CORS/GRAFTS ANGIOGRAPHY N/A 01/09/2018   Procedure: LEFT HEART CATH AND CORS/GRAFTS ANGIOGRAPHY;  Surgeon: Charolette Forward, MD;  Location: [facility];  Service: Cardiovascular;  Laterality: N/A;   LEFT HEART CATHETERIZATION WITH CORONARY ANGIOGRAM N/A 08/03/2014   Procedure: LEFT HEART CATHETERIZATION WITH CORONARY ANGIOGRAM;  Surgeon:  Birdie Riddle, MD;  Location: [facility];  Service: Cardiovascular;  Laterality: N/A;   PATCH ANGIOPLASTY Right 09/26/2021   Procedure: PATCH ANGIOPLASTY RIGHT CAROTID;  Surgeon: [name], MD;  Location: [facility];  Service: Vascular;  Laterality: Right;   Post Coronary Artery  BPG  01/05/2000   Right jugular sheath removed   PR VEIN BYPASS GRAFT,AORTO-FEM-POP     ROTATOR CUFF REPAIR     Right    REVIEW OF SYSTEMS:  Constitutional: positive for fatigue Eyes: negative Ears, nose, mouth, throat, and face: negative Respiratory: positive for dyspnea on exertion Cardiovascular: negative Gastrointestinal: negative Genitourinary:negative Integument/breast: negative Hematologic/lymphatic: negative Musculoskeletal:negative Neurological: negative Behavioral/Psych: negative Endocrine: negative Allergic/Immunologic: negative   PHYSICAL EXAMINATION: General appearance: alert, cooperative, fatigued, and no distress Head: Normocephalic, without obvious abnormality, atraumatic Neck:  no adenopathy, no JVD, supple, symmetrical, trachea midline, and thyroid not enlarged, symmetric, no tenderness/mass/nodules Lymph nodes: Cervical, supraclavicular, and axillary nodes normal. Resp: clear to auscultation bilaterally Back: symmetric, no curvature. ROM normal. No CVA tenderness. Cardio: regular rate and rhythm, S1, S2 normal, no murmur, click, rub or gallop GI: soft, non-tender; bowel sounds normal; no masses,  no organomegaly Extremities: extremities normal, atraumatic, no cyanosis or edema Neurologic: Alert and oriented X 3, normal strength and tone. Normal symmetric reflexes. Normal coordination and gait  ECOG PERFORMANCE STATUS: 1 - Symptomatic but completely ambulatory  Blood pressure (!) 197/56, pulse 68, temperature (!) 97.1 F (36.2 C), temperature source Tympanic, resp. rate 17, weight 152 lb (68.9 kg), SpO2 99 %.  LABORATORY DATA: Lab Results  Component Value Date   WBC 7.2  02/28/2022   HGB 8.1 (L) 02/28/2022   HCT 25.9 (L) 02/28/2022   MCV 87.5 02/28/2022   PLT 312 02/28/2022      Chemistry      Component Value Date/Time   NA 138 02/06/2022 0851   NA 139 08/02/2017 0821   K 4.6 02/06/2022 0851   K 4.3 08/02/2017 0821   CL 104 02/06/2022 0851   CO2 28 02/06/2022 0851   CO2 24 08/02/2017 0821   BUN 33 (H) 02/06/2022 0851   BUN 14.4 08/02/2017 0821   CREATININE 1.52 (H) 02/06/2022 0851   CREATININE 1.3 (H) 08/02/2017 0821      Component Value Date/Time   CALCIUM 9.6 02/06/2022 0851   CALCIUM 9.2 08/02/2017 0821   ALKPHOS 82 02/06/2022 0851   ALKPHOS 66 08/02/2017 0821   AST 13 (L) 02/06/2022 0851   AST 14 08/02/2017 0821   ALT 6 02/06/2022 0851   ALT 8 08/02/2017 0821   BILITOT 0.3 02/06/2022 0851   BILITOT 0.34 08/02/2017 0821       RADIOGRAPHIC STUDIES: CT Chest Wo Contrast  Result Date: 02/23/2022 CLINICAL DATA:  History of lung cancer.  * Tracking Code: BO * EXAM: CT CHEST WITHOUT CONTRAST TECHNIQUE: Multidetector CT imaging of the chest was performed following the standard protocol without IV contrast. RADIATION DOSE REDUCTION: This exam was performed according to the departmental dose-optimization program which includes automated exposure control, adjustment of the mA and/or kV according to patient size and/or use of iterative reconstruction technique. COMPARISON:  Chest CT 08/22/2021 FINDINGS: Cardiovascular: Normal heart size. Thoracic aortic and coronary arterial vascular calcifications. Mediastinum/Nodes: No enlarged axillary, mediastinal or hilar lymphadenopathy. Normal appearance of the esophagus. Lungs/Pleura: Central airways are patent. Stable biapical pleuroparenchymal thickening/scarring. Unchanged dystrophic calcification with minimal surrounding ground-glass opacity within the right upper lobe. Interval development of a 1.9 x 1.0 cm irregular subsolid nodule left lower lobe (image 100; series 5). Similar-appearing subpleural left  lower lobe scarring. Similar to mild interval increase in size of right lower lobe nodular area of consolidation (image 79; series 5). No pleural effusion or pneumothorax. Upper Abdomen: Previously described low-attenuation lesion left hepatic lobe is not definitively visualized on current exam. No acute process. Musculoskeletal: Median sternotomy. Thoracic spine degenerative changes. No aggressive or acute appearing osseous lesions. IMPRESSION: 1. Interval development of an irregular subsolid nodule within the left lower lobe. While this may be infectious/inflammatory in etiology, the possibility of primary pulmonary adenocarcinoma is excluded. Recommend follow-up CT in 3 months. 2. Similar to mild interval increase in size of right lower lobe nodular area of consolidation status post treatment. 3. Aortic Atherosclerosis (ICD10-I70.0) and Emphysema (ICD10-J43.9). Electronically Signed   By: Lovey Newcomer  M.D.   On: 02/23/2022 16:14     ASSESSMENT AND PLAN:  This is a very pleasant 81 years old white female with a stage IA non-small cell lung cancer, squamous cell carcinoma presented with right lower lobe pulmonary nodule status post stereotactic radiotherapy.  The patient is currently on observation and she is feeling fine with no concerning complaints except for the baseline shortness of breath and fatigue from the anemia. She had repeat CT scan of the chest performed recently.  I personally and independently reviewed the scan images and discussed the results with the patient today. Her scan showed development of irregular subsolid nodule in the left lower lobe likely inflammatory but a synchronous pulmonary adenocarcinoma could not be excluded.  There was also mild increase in the size of the right lower lobe nodular area of consolidation after her treatment with radiation. I recommended for the patient to have repeat CT scan of the chest in 3 months for evaluation of her disease before proceeding with  bronchoscopy and biopsy of the left lower lobe area. For the iron deficiency anemia, we will arrange for the patient to received iron infusion with Feraheme at the [facility] infusion center. For the hypertension she was advised to take her blood pressure medications and monitor it closely at home.  We will also give her a dose of clonidine in the clinic today. The patient was advised to call immediately if she has any other concerning symptoms in the interval.  All questions were answered. The patient knows to call the clinic with any problems, questions or concerns. We can certainly see the patient much sooner if necessary.  Disclaimer: This note was dictated with voice recognition software. Similar sounding words can inadvertently be transcribed and may not be corrected upon review.

## 2022-03-02 ENCOUNTER — Ambulatory Visit (INDEPENDENT_AMBULATORY_CARE_PROVIDER_SITE_OTHER): Payer: Medicare Other

## 2022-03-02 VITALS — BP 147/60 | HR 50 | Temp 98.1°F | Resp 16 | Ht 62.0 in | Wt 153.4 lb

## 2022-03-02 DIAGNOSIS — D509 Iron deficiency anemia, unspecified: Secondary | ICD-10-CM | POA: Diagnosis not present

## 2022-03-02 DIAGNOSIS — D500 Iron deficiency anemia secondary to blood loss (chronic): Secondary | ICD-10-CM

## 2022-03-02 MED ORDER — SODIUM CHLORIDE 0.9 % IV SOLN
510.0000 mg | Freq: Once | INTRAVENOUS | Status: AC
  Administered 2022-03-02: 510 mg via INTRAVENOUS
  Filled 2022-03-02: qty 17

## 2022-03-02 NOTE — Progress Notes (Signed)
Diagnosis: Iron Deficiency Anemia  Provider:  Marshell Garfinkel, MD  Procedure: Infusion  IV Type: Peripheral, IV Location: R Antecubital  Feraheme (Ferumoxytol), Dose: 510 mg  Infusion Start Time: 1101  Infusion Stop Time: 6151  Post Infusion IV Care: Observation period completed  Discharge: Condition: Good, Destination: Home . AVS provided to patient.   Performed by:  Paul Dykes, RN

## 2022-03-07 ENCOUNTER — Ambulatory Visit (INDEPENDENT_AMBULATORY_CARE_PROVIDER_SITE_OTHER): Payer: Medicare Other | Admitting: Pulmonary Disease

## 2022-03-07 ENCOUNTER — Encounter: Payer: Self-pay | Admitting: Pulmonary Disease

## 2022-03-07 VITALS — BP 112/70 | HR 57 | Ht 62.0 in | Wt 149.6 lb

## 2022-03-07 DIAGNOSIS — C3431 Malignant neoplasm of lower lobe, right bronchus or lung: Secondary | ICD-10-CM

## 2022-03-07 DIAGNOSIS — Z923 Personal history of irradiation: Secondary | ICD-10-CM | POA: Diagnosis not present

## 2022-03-07 DIAGNOSIS — J449 Chronic obstructive pulmonary disease, unspecified: Secondary | ICD-10-CM

## 2022-03-07 DIAGNOSIS — R911 Solitary pulmonary nodule: Secondary | ICD-10-CM | POA: Diagnosis not present

## 2022-03-07 DIAGNOSIS — Z85118 Personal history of other malignant neoplasm of bronchus and lung: Secondary | ICD-10-CM

## 2022-03-07 NOTE — Progress Notes (Signed)
Synopsis: Referred in January 2020 to establish care, former patient of Dr. Lenna Gilford   Subjective:   PATIENT ID: [name] GENDER: female DOB: 1941-05-07, MRN: [id number]  Chief Complaint  Patient presents with   Follow-up    Pt f/u increased SOB, she is cough but can't get anything up, and some wheezing. No questions or concerns att.    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  She is followed with Dr. Earlie Server and Dr. Tammi Klippel.  In the oncology services.  She has an appointment with him tomorrow.  She had a recent CT scan completed yesterday which reveals right lower lobe groundglass opacities and scarring after stereotactic radiation to the right lower lobe.  She also has a small 7 mm central nodular location that will need to be followed.  She had PFTs completed today prior to her office visit.  The pulmonary function test revealed moderate COPD with an FEV1 65% postbronchodilator reduced ratio of 66, significant bronchodilator response with greater than 12% improvement, as well as a reduced DLCO 55% predicted.  Patient states today that she quit using her Anora inhaler several months ago because she did not know if it was making much difference in her breathing symptoms.  Before that she liked being on it.  There was some confusion regarding stopping blood pressure medication Azor versus stopping her Anoro.  She may have misunderstood that that information regarding changing of medications.  However she does have a stock supply of an oral at home that she could restart.  She did think it made a little bit of difference but was not sure.  At baseline she is dyspneic on exertion but she does not have any significant cough or daily sputum production.  OV 02/19/2019: recently in the hospital for TIA symptoms. No stroke. Now on plavix, taken off ASA. Followed planned with Dr. Julien Nordmann from  oncology on 15th. Doing well since hospitalization. Concerned about her o2 levels. They were low a few times in there hospital. Quit smoking in 2001.   OV 09/09/2019: Patient seen today for follow-up regarding her COPD.  Her symptoms are stable.  She denies chest pain nausea vomiting diarrhea.  She does have some shortness of breath with exertion.  She lives at home.  She is able to complete her activities of daily living.  She does have questions today regarding the Covid vaccine.  Patient was counseled on Covid vaccination and encouraged to obtain this next available opportunity.  OV 12/23/2019: Here today for follow-up regarding COPD.  Patient recently seen by Dr. Loanne Drilling for exacerbation and shortness of breath increasing shortness of breath.  She had inhaler change.  At this time doing well with the inhaler but still has some shortness of breath with significant exertion.  She wanted know if there is a different medication that may go to change to.  Denies weight loss denies hemoptysis.  She has follow-up scheduled with Dr. Earlie Server in July with a repeat CT scan.  OV 05/25/2020: Patient here today for follow-up regarding her COPD.  Overall doing well today.  Currently managed with her Trelegy inhaler.  She recently had palpitations and saw her cardiologist.  She has a Holter monitor in place attached to the left chest.  She has had a few fluttering events.  She does have dyspnea on exertion which has been stable for her.  She does  need her flu shot today.  She has had both of her COVID-19 vaccinations.  She denies fevers chills night sweats weight loss cough and sputum production.  OV 05/22/2021: Here today for 1 year follow-up.  Patient has no significant respiratory complaints today.  She feels short of breath when she exerts herself which is about her baseline she is currently managed with Trelegy and likes using this inhaler.  She would like to stay on it.  She had a recent CT scan of the chest in  July which showed interval enlargement of a right upper lobe basilar nodule.  The lower lobe lesion appeared stable.  We reviewed her CT scan today in the office.  OV 08/25/2021: Here today for follow-up after recent CT scan of the chest.  Looking at a lung nodule in the anterior portion of the right upper lobe with associated groundglass calcification.  This appears stable.  From respiratory standpoint doing well using Trelegy.  She does feel short of breath with exertion but she feels like it is around her baseline.  OV 03/07/2022: Here today for follow-up after recent CT scan of the chest.  Review recent CT in July shows mild interval increase in the right lower lobe lesion that she had had radiation before.  She has a subsolid lesion in the left lower lobe that is new compared to previous imaging.  From respiratory standpoint she doing well COPD maintenance on Trelegy.  As needed albuterol she recently had a surgery on the right hand for carpal tunnel and is doing well post surgery.      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   Asthma    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   Blood in stool    last week of aug 2018   CAP (community acquired pneumonia) 10/22/2015   Chronic kidney disease    COPD (chronic obstructive pulmonary disease) (HCC)    Coronary artery disease    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   Diverticulitis    Dyspnea    with exertion   Gastroesophageal reflux disease    occ   Glaucoma    right eye   Headache syndrome 11/27/2018   Hiatal hernia    History of shingles 06/23/2018   Hyperlipidemia    Hypertension    Lung cancer ([place]) dx 2018   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   Myocardial infarction ([place]) 01/01/2000   Cardiac catheterization   Peripheral vascular disease (HCC)    stents in legs x 2 or 3   Pneumonia    last  time winter 2017 -2018   PONV (postoperative nausea and vomiting)    occassionally, last colonscopy did ok with anesthesia   Primary cancer of right lower lobe of lung ([place]) 04/25/2016   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   TIA (transient ischemic attack) 02/11/2019   Wears dentures    Full set   Wears glasses      Family History  Problem Relation Age of Onset   Heart disease Mother        Heart Disease before age 54   Hypertension Mother    Hyperlipidemia Mother    Heart attack Mother    Clotting disorder Mother    Heart disease Father        Heart Disease before age 1   Heart attack Father  Hyperlipidemia Father    Hypertension Father    Heart disease Brother        Heart Disease before age 28   Hyperlipidemia Brother    Hypertension Brother    Clotting disorder Brother    AAA (abdominal aortic aneurysm) Brother    Cerebral aneurysm Sister    Hypertension Sister    AAA (abdominal aortic aneurysm) Sister    Asthma Sister    Cerebral aneurysm Brother    Cancer Brother        Lung   Hypertension Brother    Heart attack Brother    Heart disease Brother        Aneurysm of Brain   Hypertension Brother    Heart disease Brother    Heart disease Brother    Stroke Son        Aneurysm of Stomach   AAA (abdominal aortic aneurysm) Son    Cancer Maternal Uncle        great uncle/cancer/type unknown     Past Surgical History:  Procedure Laterality Date   ABDOMINAL HYSTERECTOMY     APPENDECTOMY     Arnold-chiari malformation repair  1998   Suboccipital craniectomy   CAROTID ENDARTERECTOMY  03/29/2010   Left  CEA   CHOLECYSTECTOMY     Gall Bladder   COLONOSCOPY WITH PROPOFOL N/A 04/22/2015   Procedure: COLONOSCOPY WITH PROPOFOL;  Surgeon: Carol Ada, MD;  Location: [facility];  Service: Endoscopy;  Laterality: N/A;   COLONOSCOPY WITH PROPOFOL N/A 05/25/2016   Procedure: COLONOSCOPY WITH PROPOFOL;  Surgeon: Carol Ada, MD;  [facility];  Service: Endoscopy;  Laterality: N/A;   COLONOSCOPY WITH PROPOFOL N/A 05/03/2017   Procedure: COLONOSCOPY WITH PROPOFOL;  Surgeon: Carol Ada, MD;  [facility];  Service: Endoscopy;  Laterality: N/A;   COLONOSCOPY WITH PROPOFOL N/A 06/29/2020   Procedure: COLONOSCOPY WITH PROPOFOL;  Surgeon: Carol Ada, MD;  [facility];  Service: Endoscopy;  Laterality: N/A;   COLONOSCOPY WITH PROPOFOL N/A 01/05/2021   Procedure: COLONOSCOPY WITH PROPOFOL;  Surgeon: Carol Ada, MD;  [facility];  Service: Endoscopy;  Laterality: N/A;   COLONOSCOPY WITH PROPOFOL N/A 09/28/2021   Procedure: COLONOSCOPY WITH PROPOFOL;  Surgeon: Carol Ada, MD;  Location: [facility];  Service: Endoscopy;  Laterality: N/A;   CORNEAL TRANSPLANT     Right   CORONARY ARTERY BYPASS GRAFT  01/01/2000   x 3   ENDARTERECTOMY Right 09/26/2021   Procedure: RIGHT CAROTID ENDARTERECTOMY;  Surgeon: [name], MD;  Location: [facility];  Service: Vascular;  Laterality: Right;   ENTEROSCOPY N/A 02/27/2018   Procedure: ENTEROSCOPY;  Surgeon: Carol Ada, MD;  [facility];  Service: Endoscopy;  Laterality: N/A;   ENTEROSCOPY N/A 07/18/2018   Procedure: ENTEROSCOPY;  Surgeon: Carol Ada, MD;  [facility];  Service: Endoscopy;  Laterality: N/A;   ENTEROSCOPY N/A 06/29/2020   Procedure: ENTEROSCOPY;  Surgeon: Carol Ada, MD;  [facility];  Service: Endoscopy;  Laterality: N/A;   ENTEROSCOPY N/A 12/05/2021   Procedure: ENTEROSCOPY;  Surgeon: Carol Ada, MD;  [facility];  Service: Gastroenterology;  Laterality: N/A;   ENTEROSCOPY N/A 12/25/2021   Procedure: ENTEROSCOPY;  Surgeon: Carol Ada, MD;  [facility];  Service: Gastroenterology;  Laterality: N/A;  with endoscopic placement of video capsule   ESOPHAGOGASTRODUODENOSCOPY N/A 05/25/2016   Procedure: ESOPHAGOGASTRODUODENOSCOPY (EGD);  Surgeon: Carol Ada, MD;   Location: [facility];  Service: Endoscopy;  Laterality: N/A;   ESOPHAGOGASTRODUODENOSCOPY N/A 08/01/2018  Procedure: ESOPHAGOGASTRODUODENOSCOPY (EGD);  Surgeon: Carol Ada, MD;  Location: [facility];  Service: Endoscopy;  Laterality: N/A;   ESOPHAGOGASTRODUODENOSCOPY (EGD) WITH PROPOFOL N/A 09/28/2021   Procedure: ESOPHAGOGASTRODUODENOSCOPY (EGD) WITH PROPOFOL;  Surgeon: Carol Ada, MD;  Location: [facility];  Service: Endoscopy;  Laterality: N/A;   EYE SURGERY Right 1995 or 1996   Laser surgery for retinal hemorrhage   GIVENS CAPSULE STUDY N/A 07/16/2018   Procedure: GIVENS CAPSULE STUDY;  Surgeon: Carol Ada, MD;  [facility];  Service: Endoscopy;  Laterality: N/A;   GIVENS CAPSULE STUDY N/A 12/14/2021   Procedure: GIVENS CAPSULE STUDY;  Surgeon: Carol Ada, MD;  Location: [facility];  Service: Gastroenterology;  Laterality: N/A;   GIVENS CAPSULE STUDY N/A 12/25/2021   Procedure: GIVENS CAPSULE STUDY;  Surgeon: Carol Ada, MD;  [facility];  Service: Gastroenterology;  Laterality: N/A;   HEMOSTASIS CLIP PLACEMENT  06/29/2020   Procedure: HEMOSTASIS CLIP PLACEMENT;  Surgeon: Carol Ada, MD;  [facility];  Service: Endoscopy;;   HEMOSTASIS CLIP PLACEMENT  01/05/2021   Procedure: HEMOSTASIS CLIP PLACEMENT;  Surgeon: Carol Ada, MD;  [facility];  Service: Endoscopy;;   HOT HEMOSTASIS N/A 02/27/2018   Procedure: HOT HEMOSTASIS (ARGON PLASMA COAGULATION/BICAP);  Surgeon: Carol Ada, MD;  Location: [facility];  Service: Endoscopy;  Laterality: N/A;   HOT HEMOSTASIS N/A 08/01/2018   Procedure: HOT HEMOSTASIS (ARGON PLASMA COAGULATION/BICAP);  Surgeon: Carol Ada, MD;  Location: [facility];  Service: Endoscopy;  Laterality: N/A;   HOT HEMOSTASIS N/A 06/29/2020   Procedure: HOT HEMOSTASIS (ARGON PLASMA COAGULATION/BICAP);  Surgeon: Carol Ada, MD;  Location: [facility];  Service: Endoscopy;  Laterality: N/A;   HOT  HEMOSTASIS N/A 01/05/2021   Procedure: HOT HEMOSTASIS (ARGON PLASMA COAGULATION/BICAP);  Surgeon: Carol Ada, MD;  Location: [facility];  Service: Endoscopy;  Laterality: N/A;   HOT HEMOSTASIS N/A 12/05/2021   Procedure: HOT HEMOSTASIS (ARGON PLASMA COAGULATION/BICAP);  Surgeon: Carol Ada, MD;  Location: [facility];  Service: Gastroenterology;  Laterality: N/A;   IR RADIOLOGIST EVAL & MGMT  12/14/2016   IR RADIOLOGIST EVAL & MGMT  07/26/2021   LEFT HEART CATH AND CORS/GRAFTS ANGIOGRAPHY N/A 01/09/2018   Procedure: LEFT HEART CATH AND CORS/GRAFTS ANGIOGRAPHY;  Surgeon: Charolette Forward, MD;  Location: [facility];  Service: Cardiovascular;  Laterality: N/A;   LEFT HEART CATHETERIZATION WITH CORONARY ANGIOGRAM N/A 08/03/2014   Procedure: LEFT HEART CATHETERIZATION WITH CORONARY ANGIOGRAM;  Surgeon: Birdie Riddle, MD;  Location: [facility];  Service: Cardiovascular;  Laterality: N/A;   PATCH ANGIOPLASTY Right 09/26/2021   Procedure: PATCH ANGIOPLASTY RIGHT CAROTID;  Surgeon: [name], MD;  Location: [facility];  Service: Vascular;  Laterality: Right;   Post Coronary Artery  BPG  01/05/2000   Right jugular sheath removed   PR VEIN BYPASS GRAFT,AORTO-FEM-POP     ROTATOR CUFF REPAIR     Right    Social History   Socioeconomic History   Marital status: Widowed    Spouse name: Not on file   Number of children: 4   Years of education: 7TH   Highest education level: Not on file  Occupational History   Not on file  Tobacco Use   Smoking status: Former    Packs/day: 1.50    Years: 30.00    Total pack years: 45.00    Types: Cigarettes    Quit date: 08/13/2000    Years since quitting: 21.5   Smokeless tobacco: Never  Vaping Use   Vaping Use:  Never used  Substance and Sexual Activity   Alcohol use: No    Alcohol/week: 0.0 standard drinks of alcohol   Drug use: No   Sexual activity: Never  Other Topics Concern   Not on file  Social History Narrative   Lives at home  w/ her son   Right-handed   Drinks coffee and [employer] daily   Social Determinants of Health   Financial Resource Strain: Not on file  Food Insecurity: Not on file  Transportation Needs: Not on file  Physical Activity: Not on file  Stress: Not on file  Social Connections: Not on file  Intimate Partner Violence: Not on file     Allergies  Allergen Reactions   Avelox [Moxifloxacin Hcl In Nacl] Palpitations and Other (See Comments)    Caused Heart Attack    Azithromycin Swelling and Other (See Comments)    Patient reported past history of lip swelling   Codeine Other (See Comments)    Dr. Terrence Dupont advised patient not to take this medication   Doxycycline Swelling and Other (See Comments)    Mouth, lips, feet swelling   Hydromorphone Palpitations and Other (See Comments)    DILAUDID  -  Pt had a Heart Attack after taking Dilaudid.   Levaquin [Levofloxacin] Shortness Of Breath and Other (See Comments)    Chest pressure, SOB, "pain in between shoulder blades", sweaty -as reported by patient per experience in ED this afternoon   Vitamin D Analogs Swelling and Other (See Comments)    Face and lips swell, but no breathing issues   Nifedipine Er Other (See Comments)    Dropped the heart rate too low   Octreotide     Chest pain, N/V   Oxycodone-Acetaminophen Other (See Comments)    Says it makes her "feel weird"   Risedronate Other (See Comments)    Chest pain     Outpatient Medications Prior to Visit  Medication Sig Dispense Refill   acetaminophen (TYLENOL) 500 MG tablet Take 500-1,000 mg by mouth every 6 (six) hours as needed (for pain).     albuterol (VENTOLIN HFA) 108 (90 Base) MCG/ACT inhaler Inhale 2 puffs into the lungs every 6 (six) hours as needed for wheezing or shortness of breath. 8 g 6   atorvastatin (LIPITOR) 40 MG tablet Take 1 tablet (40 mg total) by mouth at bedtime. 30 tablet 6   cetirizine (ZYRTEC) 10 MG tablet Take 10 mg by mouth daily as needed for allergies.       clopidogrel (PLAVIX) 75 MG tablet Take 1 tablet (75 mg total) by mouth daily. (Patient taking differently: Take 37.5 mg by mouth in the morning.) 90 tablet 3   dexlansoprazole (DEXILANT) 60 MG capsule Take 1 capsule (60 mg total) by mouth daily before breakfast. 30 capsule 11   Fluticasone-Umeclidin-Vilant (TRELEGY ELLIPTA) 200-62.5-25 MCG/ACT AEPB Inhale 1 puff into the lungs daily. 180 each 3   furosemide (LASIX) 40 MG tablet Take 40 mg by mouth in the morning.     Magnesium Oxide 400 MG CAPS Take 400 mg by mouth in the morning.     nitroGLYCERIN (NITROSTAT) 0.4 MG SL tablet Place 0.4 mg under the tongue every 5 (five) minutes x 3 doses as needed for chest pain.     olmesartan (BENICAR) 20 MG tablet Take 1 tablet (20 mg total) by mouth daily. (Patient taking differently: Take 10 mg by mouth in the morning.) 90 tablet 1   ondansetron (ZOFRAN-ODT) 4 MG disintegrating tablet Take 4 mg  by mouth every 8 (eight) hours as needed for nausea or vomiting (dissolve in the mouth).     No facility-administered medications prior to visit.    Review of Systems  Constitutional:  Negative for chills, fever, malaise/fatigue and weight loss.  HENT:  Negative for hearing loss, sore throat and tinnitus.   Eyes:  Negative for blurred vision and double vision.  Respiratory:  Positive for shortness of breath. Negative for cough, hemoptysis, sputum production, wheezing and stridor.   Cardiovascular:  Negative for chest pain, palpitations, orthopnea, leg swelling and PND.  Gastrointestinal:  Negative for abdominal pain, constipation, diarrhea, heartburn, nausea and vomiting.  Genitourinary:  Negative for dysuria, hematuria and urgency.  Musculoskeletal:  Negative for joint pain and myalgias.  Skin:  Negative for itching and rash.  Neurological:  Negative for dizziness, tingling, weakness and headaches.  Endo/Heme/Allergies:  Negative for environmental allergies. Does not bruise/bleed easily.  Psychiatric/Behavioral:   Negative for depression. The patient is not nervous/anxious and does not have insomnia.   All other systems reviewed and are negative.    Objective:  Physical Exam Vitals reviewed.  Constitutional:      General: She is not in acute distress.    Appearance: She is well-developed.  HENT:     Head: Normocephalic and atraumatic.  Eyes:     General: No scleral icterus.    Conjunctiva/sclera: Conjunctivae normal.     Pupils: Pupils are equal, round, and reactive to light.  Neck:     Vascular: No JVD.     Trachea: No tracheal deviation.  Cardiovascular:     Rate and Rhythm: Normal rate and regular rhythm.     Heart sounds: Normal heart sounds. No murmur heard. Pulmonary:     Effort: Pulmonary effort is normal. No tachypnea, accessory muscle usage or respiratory distress.     Breath sounds: No stridor. No wheezing, rhonchi or rales.     Comments: Diminished breath sounds bilaterally Abdominal:     General: There is no distension.     Palpations: Abdomen is soft.     Tenderness: There is no abdominal tenderness.  Musculoskeletal:        General: Signs of injury present. No tenderness.     Cervical back: Neck supple.     Comments: Right hand forearm in cast status post carpal tunnel surgery  Lymphadenopathy:     Cervical: No cervical adenopathy.  Skin:    General: Skin is warm and dry.     Capillary Refill: Capillary refill takes less than 2 seconds.     Findings: No rash.  Neurological:     Mental Status: She is alert and oriented to person, place, and time.  Psychiatric:        Behavior: Behavior normal.     Vitals:   03/07/22 1105  BP: 112/70  Pulse: (!) 57  SpO2: 97%  Weight: 149 lb 9.6 oz (67.9 kg)  Height: 5\' 2"  (1.575 m)   97% on RA BMI Readings from Last 3 Encounters:  03/07/22 27.36 kg/m  03/02/22 28.06 kg/m  02/28/22 27.80 kg/m   Wt Readings from Last 3 Encounters:  03/07/22 149 lb 9.6 oz (67.9 kg)  03/02/22 153 lb 6.4 oz (69.6 kg)  02/28/22 152 lb  (68.9 kg)   Chest Imaging:  08/25/2018 CT Chest:  Imaging reviewed.  Right lower lobe areas of groundglass and scarring status post sterotactic radiation likely radiation-induced parenchymal scarring.  Small nodular density within this area 7 mm in size.  She  also has a new associated/adjacent rib fracture within that right side which may be concerning and would need to be followed for any recurrence of squamous cell disease. The patient's images have been independently reviewed by me.    02/23/2019 CT Chest: Follow up scheduled for cancer eval and staging   Chest x-ray 11/10/2019: Chronic scarring in the bases.  Mildly hyperinflated lung fields consistent with COPD diagnosis. The patient's images have been independently reviewed by me.    02/27/2021: CT scan of the chest Irregular 1.4 cm sup solid anterior basilar right upper lobe pulmonary nodule.  Slowly enlarging over time. Also has masslike fibrosis in the right lower lobe. The patient's images have been independently reviewed by me.    08/22/2021 CT chest: Nodule within the right lower lobe stable post treatments.  Right upper lobe anterior lung nodule stable in appearance The patient's images have been independently reviewed by me.    July 2023: CT chest: Mild interval enlargement in the right lower lobe lesion that had previous radiation.  Has a new subsolid lesion in the left lower lobe. The patient's images have been independently reviewed by me.     Pulmonary Functions Testing Results:    Latest Ref Rng & Units 08/26/2018   12:59 PM 02/14/2018   11:18 AM 04/10/2016    9:11 AM  PFT Results  FVC-Pre L 1.69  1.80  P 1.53   FVC-Predicted Pre % 65  69  P 57   FVC-Post L 1.91  1.99  P 1.82   FVC-Predicted Post % 74  77  P 69   Pre FEV1/FVC % % 67  72  P 71   Post FEV1/FCV % % 66  72  P 70   FEV1-Pre L 1.12  1.30  P 1.09   FEV1-Predicted Pre % 58  67  P 54   FEV1-Post L 1.26  1.43  P 1.28   DLCO uncorrected ml/min/mmHg  11.93   11.07   DLCO UNC% % 55   51   DLCO corrected ml/min/mmHg 13.35     DLCO COR %Predicted % 61     DLVA Predicted % 97   86   TLC L 3.85   3.77   TLC % Predicted % 81   79   RV % Predicted % 94   96     P Preliminary result     FeNO: None   Pathology:   Squamous cell carcinoma of the lung right lower lobe status post stereotactic radiation therapy  Echocardiogram: none recent   Heart Catheterization:   Jan 09, 2018: Dr. Nelta Numbers LAD to Prox LAD lesion is 80% stenosed. Ost 1st Diag lesion is 100% stenosed. Prox RCA lesion is 20% stenosed. Mid RCA lesion is 30% stenosed. Mid LM to Dist LM lesion is 40% stenosed. Ost Ramus lesion is 40% stenosed. Ost Cx lesion is 40% stenosed. Origin lesion is 100% stenosed. Origin to Prox Graft lesion is 100% stenosed. The left ventricular systolic function is normal. LV end diastolic pressure is normal. The left ventricular ejection fraction is 50-55% by visual estimate.    Assessment & Plan:     ICD-10-CM   1. Lung nodule  R91.1     2. Stage 2 moderate COPD by GOLD classification ([place])  J44.9     3. Primary cancer of right lower lobe of lung (HCC)  C34.31     4. Hx of radiation therapy  Z92.3     5. Hx of  cancer of lung  Z85.118        Assessment:   This is a 81 year old female, seen in clinic for her stage II COPD, history of right lower lobe lung cancer status postradiation, history of CABG, right upper lobe small nodule stable with central calcification, new left lower lobe subsolid nodule that was not present on previous CT imaging 3 months ago.  Plan: Repeat noncontrasted CT scan of the chest in 3 months. Dr. Earlie Server and oncology team will let me know if we feel like we need to consider biopsy of this right lower lobe lesion that has slowly been enlarging or if the left lower lobe lesion is persistent consider biopsy of this. I am happy to take her for navigational bronchoscopy. Continue Trelegy for management  of her COPD. She can see me in 6 months or as needed. I put a reminder in epic for me to follow-up on her CT scan of the chest in 3 months.    Current Outpatient Medications:    acetaminophen (TYLENOL) 500 MG tablet, Take 500-1,000 mg by mouth every 6 (six) hours as needed (for pain)., Disp: , Rfl:    albuterol (VENTOLIN HFA) 108 (90 Base) MCG/ACT inhaler, Inhale 2 puffs into the lungs every 6 (six) hours as needed for wheezing or shortness of breath., Disp: 8 g, Rfl: 6   atorvastatin (LIPITOR) 40 MG tablet, Take 1 tablet (40 mg total) by mouth at bedtime., Disp: 30 tablet, Rfl: 6   cetirizine (ZYRTEC) 10 MG tablet, Take 10 mg by mouth daily as needed for allergies. , Disp: , Rfl:    clopidogrel (PLAVIX) 75 MG tablet, Take 1 tablet (75 mg total) by mouth daily. (Patient taking differently: Take 37.5 mg by mouth in the morning.), Disp: 90 tablet, Rfl: 3   dexlansoprazole (DEXILANT) 60 MG capsule, Take 1 capsule (60 mg total) by mouth daily before breakfast., Disp: 30 capsule, Rfl: 11   Fluticasone-Umeclidin-Vilant (TRELEGY ELLIPTA) 200-62.5-25 MCG/ACT AEPB, Inhale 1 puff into the lungs daily., Disp: 180 each, Rfl: 3   furosemide (LASIX) 40 MG tablet, Take 40 mg by mouth in the morning., Disp: , Rfl:    Magnesium Oxide 400 MG CAPS, Take 400 mg by mouth in the morning., Disp: , Rfl:    nitroGLYCERIN (NITROSTAT) 0.4 MG SL tablet, Place 0.4 mg under the tongue every 5 (five) minutes x 3 doses as needed for chest pain., Disp: , Rfl:    olmesartan (BENICAR) 20 MG tablet, Take 1 tablet (20 mg total) by mouth daily. (Patient taking differently: Take 10 mg by mouth in the morning.), Disp: 90 tablet, Rfl: 1   ondansetron (ZOFRAN-ODT) 4 MG disintegrating tablet, Take 4 mg by mouth every 8 (eight) hours as needed for nausea or vomiting (dissolve in the mouth)., Disp: , Rfl:     Garner Nash, DO [facility] 03/07/2022 11:16 AM

## 2022-03-07 NOTE — Patient Instructions (Signed)
Thank you for visiting Dr. Valeta Harms at [facility]. Today we recommend the following:  Image follow up scheduled with Dr. Julien Nordmann We will arrange biopsy if needed  Stay on trelegy   Return in about 6 months (around 09/07/2022).    Please do your part to reduce the spread of COVID-19.

## 2022-03-09 ENCOUNTER — Ambulatory Visit (INDEPENDENT_AMBULATORY_CARE_PROVIDER_SITE_OTHER): Payer: Medicare Other

## 2022-03-09 VITALS — BP 177/62 | HR 60 | Temp 98.2°F | Resp 16 | Ht 62.0 in | Wt 150.0 lb

## 2022-03-09 DIAGNOSIS — D509 Iron deficiency anemia, unspecified: Secondary | ICD-10-CM

## 2022-03-09 DIAGNOSIS — D500 Iron deficiency anemia secondary to blood loss (chronic): Secondary | ICD-10-CM

## 2022-03-09 MED ORDER — SODIUM CHLORIDE 0.9 % IV SOLN
510.0000 mg | Freq: Once | INTRAVENOUS | Status: AC
  Administered 2022-03-09: 510 mg via INTRAVENOUS
  Filled 2022-03-09: qty 17

## 2022-03-09 NOTE — Progress Notes (Signed)
Diagnosis: Iron Deficiency Anemia  Provider:  Marshell Garfinkel, MD  Procedure: Infusion  IV Type: Peripheral, IV Location: L Antecubital  Feraheme (Ferumoxytol), Dose: 510 mg  Infusion Start Time: 0939  Infusion Stop Time: 1003  Post Infusion IV Care: Peripheral IV Discontinued  Discharge: Condition: Good, Destination: Home . AVS provided to patient.   Performed by:  Adelina Mings, LPN

## 2022-03-12 ENCOUNTER — Encounter: Payer: Self-pay | Admitting: Physician Assistant

## 2022-03-13 ENCOUNTER — Encounter: Payer: Self-pay | Admitting: Physician Assistant

## 2022-04-07 ENCOUNTER — Other Ambulatory Visit: Payer: Self-pay

## 2022-04-07 ENCOUNTER — Emergency Department (HOSPITAL_COMMUNITY)
Admission: EM | Admit: 2022-04-07 | Discharge: 2022-04-07 | Disposition: A | Discharge status: Home / Self Care | Payer: Medicare Other | Attending: Emergency Medicine | Admitting: Emergency Medicine

## 2022-04-07 ENCOUNTER — Emergency Department (HOSPITAL_COMMUNITY): Payer: Medicare Other

## 2022-04-07 DIAGNOSIS — K5732 Diverticulitis of large intestine without perforation or abscess without bleeding: Secondary | ICD-10-CM | POA: Insufficient documentation

## 2022-04-07 DIAGNOSIS — K5792 Diverticulitis of intestine, part unspecified, without perforation or abscess without bleeding: Secondary | ICD-10-CM

## 2022-04-07 DIAGNOSIS — J449 Chronic obstructive pulmonary disease, unspecified: Secondary | ICD-10-CM | POA: Insufficient documentation

## 2022-04-07 DIAGNOSIS — I10 Essential (primary) hypertension: Secondary | ICD-10-CM | POA: Diagnosis not present

## 2022-04-07 DIAGNOSIS — R001 Bradycardia, unspecified: Secondary | ICD-10-CM | POA: Diagnosis not present

## 2022-04-07 DIAGNOSIS — Z7901 Long term (current) use of anticoagulants: Secondary | ICD-10-CM | POA: Diagnosis not present

## 2022-04-07 DIAGNOSIS — D649 Anemia, unspecified: Secondary | ICD-10-CM | POA: Insufficient documentation

## 2022-04-07 DIAGNOSIS — R1030 Lower abdominal pain, unspecified: Secondary | ICD-10-CM | POA: Diagnosis present

## 2022-04-07 LAB — COMPREHENSIVE METABOLIC PANEL
ALT: 9 U/L (ref 0–44)
AST: 17 U/L (ref 15–41)
Albumin: 3.5 g/dL (ref 3.5–5.0)
Alkaline Phosphatase: 76 U/L (ref 38–126)
Anion gap: 9 (ref 5–15)
BUN: 38 mg/dL — ABNORMAL HIGH (ref 8–23)
CO2: 24 mmol/L (ref 22–32)
Calcium: 8.9 mg/dL (ref 8.9–10.3)
Chloride: 105 mmol/L (ref 98–111)
Creatinine, Ser: 1.88 mg/dL — ABNORMAL HIGH (ref 0.44–1.00)
GFR, Estimated: 27 mL/min — ABNORMAL LOW (ref >60)
Glucose, Bld: 113 mg/dL — ABNORMAL HIGH (ref 70–99)
Potassium: 4.2 mmol/L (ref 3.5–5.1)
Sodium: 138 mmol/L (ref 135–145)
Total Bilirubin: 0.2 mg/dL — ABNORMAL LOW (ref 0.3–1.2)
Total Protein: 6.8 g/dL (ref 6.5–8.1)

## 2022-04-07 LAB — CBC WITH DIFFERENTIAL/PLATELET
Abs Immature Granulocytes: 0.02 10*3/uL (ref 0.00–0.07)
Basophils Absolute: 0.1 10*3/uL (ref 0.0–0.1)
Basophils Relative: 1 %
Eosinophils Absolute: 0.4 10*3/uL (ref 0.0–0.5)
Eosinophils Relative: 5 %
HCT: 29.1 % — ABNORMAL LOW (ref 36.0–46.0)
Hemoglobin: 9.3 g/dL — ABNORMAL LOW (ref 12.0–15.0)
Immature Granulocytes: 0 %
Lymphocytes Relative: 33 %
Lymphs Abs: 2.5 10*3/uL (ref 0.7–4.0)
MCH: 29.9 pg (ref 26.0–34.0)
MCHC: 32 g/dL (ref 30.0–36.0)
MCV: 93.6 fL (ref 80.0–100.0)
Monocytes Absolute: 0.8 10*3/uL (ref 0.1–1.0)
Monocytes Relative: 10 %
Neutro Abs: 3.9 10*3/uL (ref 1.7–7.7)
Neutrophils Relative %: 51 %
Platelets: 270 10*3/uL (ref 150–400)
RBC: 3.11 MIL/uL — ABNORMAL LOW (ref 3.87–5.11)
RDW: 18.9 % — ABNORMAL HIGH (ref 11.5–15.5)
WBC: 7.6 10*3/uL (ref 4.0–10.5)
nRBC: 0 % (ref 0.0–0.2)

## 2022-04-07 LAB — URINALYSIS, ROUTINE W REFLEX MICROSCOPIC
Bilirubin Urine: NEGATIVE
Glucose, UA: NEGATIVE mg/dL
Hgb urine dipstick: NEGATIVE
Ketones, ur: NEGATIVE mg/dL
Nitrite: NEGATIVE
Protein, ur: NEGATIVE mg/dL
Specific Gravity, Urine: 1.016 (ref 1.005–1.030)
Trans Epithel, UA: 3
pH: 5 (ref 5.0–8.0)

## 2022-04-07 LAB — I-STAT CHEM 8, ED
BUN: 34 mg/dL — ABNORMAL HIGH (ref 8–23)
Calcium, Ion: 1.2 mmol/L (ref 1.15–1.40)
Chloride: 103 mmol/L (ref 98–111)
Creatinine, Ser: 1.9 mg/dL — ABNORMAL HIGH (ref 0.44–1.00)
Glucose, Bld: 110 mg/dL — ABNORMAL HIGH (ref 70–99)
HCT: 28 % — ABNORMAL LOW (ref 36.0–46.0)
Hemoglobin: 9.5 g/dL — ABNORMAL LOW (ref 12.0–15.0)
Potassium: 4.1 mmol/L (ref 3.5–5.1)
Sodium: 139 mmol/L (ref 135–145)
TCO2: 23 mmol/L (ref 22–32)

## 2022-04-07 LAB — LIPASE, BLOOD: Lipase: 39 U/L (ref 11–51)

## 2022-04-07 LAB — MAGNESIUM: Magnesium: 2.3 mg/dL (ref 1.7–2.4)

## 2022-04-07 MED ORDER — LACTATED RINGERS IV BOLUS
500.0000 mL | Freq: Once | INTRAVENOUS | Status: AC
  Administered 2022-04-07: 500 mL via INTRAVENOUS

## 2022-04-07 MED ORDER — FENTANYL CITRATE PF 50 MCG/ML IJ SOSY: 50.0000 ug | PREFILLED_SYRINGE | Freq: Once | INTRAMUSCULAR | Status: DC

## 2022-04-07 MED ORDER — AMOXICILLIN-POT CLAVULANATE 500-125 MG PO TABS: 1.0000 | ORAL_TABLET | Freq: Two times a day (BID) | ORAL | 0 refills | Status: DC

## 2022-04-07 MED ORDER — AMOXICILLIN-POT CLAVULANATE 500-125 MG PO TABS
1.0000 | ORAL_TABLET | Freq: Once | ORAL | Status: AC
  Administered 2022-04-07: 500 mg via ORAL
  Filled 2022-04-07: qty 1

## 2022-04-07 MED ORDER — ACETAMINOPHEN 500 MG PO TABS
1000.0000 mg | ORAL_TABLET | ORAL | Status: AC
  Administered 2022-04-07: 1000 mg via ORAL
  Filled 2022-04-07: qty 2

## 2022-04-07 NOTE — Discharge Instructions (Addendum)
You were seen in the ER for evaluation of your lower abdominal pain and diarrhea. It appears that you have diverticulitis. For this, you will need to be on an antibiotic. I am putting you on Augmentin for you to take every 12 hours for the next 7 days. You can take Tylenol 1000mg  every 6 hours as needed for pain. Please follow up with your GI doctor within the next few days for evaluation. If you have any worsening abdominal pain, rectal bleeding, dark stools, fever, nausea, vomiting, please return to the ER for re-evaluation.   Make sure you are staying hydrated!  Contact a doctor if: Your pain does not get better. You are not pooping like normal. Get help right away if: Your pain gets worse. Your symptoms do not get better. Your symptoms get worse very fast. You have a fever. You vomit more than one time. You have poop that is: Bloody. Black. Tarry.

## 2022-04-07 NOTE — ED Triage Notes (Signed)
Pt arrives with c/o diarrhea x3-4 days. Pt denies n/v. Pt endorses headache and ABD pain.

## 2022-04-07 NOTE — ED Provider Notes (Signed)
[facility] DEPT Provider Note   CSN: [id number] Arrival date & time: 04/07/22  1607     History Chief Complaint  Patient presents with   Diarrhea    [name] is a 81 y.o. female with h/o GI bleed, TIA, COPD, hypertension, hyperlipidemia presents the emergency room for evaluation of lower abdominal pain with diarrhea for the past 3 to 4 days.  Patient reports her diarrhea has been yellow in color.  She denies any nausea or vomiting, however is scared to eat as she almost immediately needs to use the restroom.  She denies any fever, recent travel, chest pain, shortness of breath, cough, rhinorrhea, or nasal congestion.  She reports that she does have a headache although thinks it is because she has not been eating.  Reports that it goes away with Tylenol.  She has been using Tylenol for pain management.  She has several medication allergies.   Diarrhea Associated symptoms: abdominal pain and headaches   Associated symptoms: no chills, no fever and no vomiting        Home Medications Prior to Admission medications   Medication Sig Start Date End Date Taking? Authorizing Provider  acetaminophen (TYLENOL) 500 MG tablet Take 500-1,000 mg by mouth every 6 (six) hours as needed (for pain).    [provider]  albuterol (VENTOLIN HFA) 108 (90 Base) MCG/ACT inhaler Inhale 2 puffs into the lungs every 6 (six) hours as needed for wheezing or shortness of breath. 05/25/20   Icard, Octavio Graves, DO  atorvastatin (LIPITOR) 40 MG tablet Take 1 tablet (40 mg total) by mouth at bedtime. 09/29/21   Rhyne, Hulen Shouts, PA-C  cetirizine (ZYRTEC) 10 MG tablet Take 10 mg by mouth daily as needed for allergies.     [provider]  clopidogrel (PLAVIX) 75 MG tablet Take 1 tablet (75 mg total) by mouth daily. Patient taking differently: Take 37.5 mg by mouth in the morning. 07/08/20   Mercy Riding, MD  dexlansoprazole (DEXILANT) 60 MG capsule Take 1 capsule  (60 mg total) by mouth daily before breakfast. 11/17/12   Tanda Rockers, MD  Fluticasone-Umeclidin-Vilant (TRELEGY ELLIPTA) 200-62.5-25 MCG/ACT AEPB Inhale 1 puff into the lungs daily. 06/15/21   Garner Nash, DO  furosemide (LASIX) 40 MG tablet Take 40 mg by mouth in the morning. 01/30/21   [provider]  Magnesium Oxide 400 MG CAPS Take 400 mg by mouth in the morning.    [provider]  nitroGLYCERIN (NITROSTAT) 0.4 MG SL tablet Place 0.4 mg under the tongue every 5 (five) minutes x 3 doses as needed for chest pain.    [provider]  olmesartan (BENICAR) 20 MG tablet Take 1 tablet (20 mg total) by mouth daily. Patient taking differently: Take 10 mg by mouth in the morning. 07/04/20   Mercy Riding, MD  ondansetron (ZOFRAN-ODT) 4 MG disintegrating tablet Take 4 mg by mouth every 8 (eight) hours as needed for nausea or vomiting (dissolve in the mouth). 03/29/21   [provider]      Allergies    Avelox [moxifloxacin hcl in nacl], Azithromycin, Codeine, Doxycycline, Hydromorphone, Levaquin [levofloxacin], Vitamin d analogs, Nifedipine er, Octreotide, Oxycodone-acetaminophen, and Risedronate    Review of Systems   Review of Systems  Constitutional:  Negative for chills and fever.  HENT:  Negative for congestion and rhinorrhea.   Respiratory:  Negative for shortness of breath.   Cardiovascular:  Negative for chest pain.  Gastrointestinal:  Positive for abdominal pain and diarrhea. Negative for blood in stool, nausea and vomiting.  Genitourinary:  Negative for dysuria and hematuria.  Neurological:  Positive for headaches. Negative for weakness.    Physical Exam Updated Vital Signs BP (!) 129/49 (BP Location: Right Arm)   Pulse (!) 53   Temp 98.1 F (36.7 C) (Oral)   Resp 16   Ht 5\' 2"  (1.575 m)   Wt 70.3 kg   SpO2 96%   BMI 28.35 kg/m  Physical Exam Constitutional:      General: She is not in acute distress.    Appearance: Normal  appearance. She is not ill-appearing or toxic-appearing.  HENT:     Head: Normocephalic and atraumatic.     Mouth/Throat:     Mouth: Mucous membranes are moist.     Comments: Dentures in place Eyes:     General: No scleral icterus. Cardiovascular:     Rate and Rhythm: Regular rhythm. Bradycardia present.  Pulmonary:     Effort: Pulmonary effort is normal.     Breath sounds: Normal breath sounds.  Abdominal:     General: Bowel sounds are normal.     Palpations: Abdomen is soft.     Tenderness: There is abdominal tenderness. There is no guarding or rebound.     Comments: Suprapubic and LLQ abdominal tenderness to palpation. Some voluntary guarding. No rebound tenderness. No overlying skin changes noted. NBS. Abdomen is soft, non-distended.   Musculoskeletal:        General: No deformity.     Cervical back: Normal range of motion.  Skin:    General: Skin is warm and dry.  Neurological:     General: No focal deficit present.     Mental Status: She is alert. Mental status is at baseline.     Cranial Nerves: No cranial nerve deficit.     Sensory: No sensory deficit.     ED Results / Procedures / Treatments   Labs (all labs ordered are listed, but only abnormal results are displayed) Labs Reviewed  CBC WITH DIFFERENTIAL/PLATELET - Abnormal; Notable for the following components:      Result Value   RBC 3.11 (*)    Hemoglobin 9.3 (*)    HCT 29.1 (*)    RDW 18.9 (*)    All other components within normal limits  COMPREHENSIVE METABOLIC PANEL - Abnormal; Notable for the following components:   Glucose, Bld 113 (*)    BUN 38 (*)    Creatinine, Ser 1.88 (*)    Total Bilirubin 0.2 (*)    GFR, Estimated 27 (*)    All other components within normal limits  URINALYSIS, ROUTINE W REFLEX MICROSCOPIC - Abnormal; Notable for the following components:   APPearance HAZY (*)    Leukocytes,Ua LARGE (*)    Bacteria, UA RARE (*)    All other components within normal limits  I-STAT CHEM 8,  ED - Abnormal; Notable for the following components:   BUN 34 (*)    Creatinine, Ser 1.90 (*)    Glucose, Bld 110 (*)    Hemoglobin 9.5 (*)    HCT 28.0 (*)    All other components within normal limits  LIPASE, BLOOD  MAGNESIUM  TYPE AND SCREEN    EKG None  Radiology CT ABDOMEN PELVIS WO CONTRAST  Result Date: 04/07/2022 CLINICAL DATA:  Left lower quadrant pain and diarrhea for several days, initial encounter EXAM: CT ABDOMEN AND PELVIS WITHOUT CONTRAST TECHNIQUE: Multidetector CT imaging of the abdomen  and pelvis was performed following the standard protocol without IV contrast. RADIATION DOSE REDUCTION: This exam was performed according to the departmental dose-optimization program which includes automated exposure control, adjustment of the mA and/or kV according to patient size and/or use of iterative reconstruction technique. COMPARISON:  12/23/2021 FINDINGS: Lower chest: Mild scarring is noted in the right lower lobe stable from the prior study. No new focal infiltrate is seen. Hepatobiliary: No focal liver abnormality is seen. Status post cholecystectomy. No biliary dilatation. Pancreas: Unremarkable. No pancreatic ductal dilatation or surrounding inflammatory changes. Spleen: Normal in size without focal abnormality. Adrenals/Urinary Tract: Adrenal glands are within normal limits. Kidneys are well visualized bilaterally. No renal calculi are noted. No obstructive changes are seen. The bladder is decompressed. Stomach/Bowel: Mild diverticular changes noted. Some wall thickening and pericolonic inflammatory changes noted in the proximal aspect of the sigmoid colon consistent with focal diverticulitis. No abscess or perforation is noted. The appendix has been surgically removed. Small bowel and stomach are unremarkable. Vascular/Lymphatic: Aortic atherosclerosis. No enlarged abdominal or pelvic lymph nodes. Bilateral common iliac artery stents are noted. Reproductive: Status post hysterectomy.  No adnexal masses. Other: No abdominal wall hernia or abnormality. No abdominopelvic ascites. Musculoskeletal: No acute or significant osseous findings. IMPRESSION: Mild diverticulitis of the proximal sigmoid colon. No other focal abnormality is noted. Electronically Signed   By: [name] M.D.   On: 04/07/2022 19:28    Procedures Procedures   Medications Ordered in ED Medications  amoxicillin-clavulanate (AUGMENTIN) 500-125 MG per tablet 500 mg (has no administration in time range)  lactated ringers bolus 500 mL (0 mLs Intravenous Stopped 04/07/22 1834)  lactated ringers bolus 500 mL (500 mLs Intravenous New Bag/Given 04/07/22 1959)  acetaminophen (TYLENOL) tablet 1,000 mg (1,000 mg Oral Given 04/07/22 2000)    ED Course/ Medical Decision Making/ A&P                           Medical Decision Making Amount and/or Complexity of Data Reviewed Labs: ordered. Radiology: ordered.  Risk OTC drugs. Prescription drug management.   81 year old female presents the Emergency Department for evaluation of lower abdominal pain and diarrhea.  Differential diagnosis includes was not limited to colitis versus diverticulitis versus small bowel obstruction versus gastroenteritis.  Vital signs show slight decrease in diastolic pressure however afebrile.  Bradycardic satting well on room air without any increased work of breathing.  Physical exam as noted above.  She does have some left lower quadrant and suprapubic tenderness palpation.  Abdomen is soft and nondistended.    We will order labs.  Patient has history of CKD with a GFR around 30 or less, will order CT abdomen without contrast.  I independently reviewed and interpreted the patient's labs.  CMP shows mildly increased glucose at 113 although patient not fasting.  Patient's creatinine at 1.8 which is slightly elevated from her baseline around 1.5.  Total bilirubin decreased to 0.2 otherwise no electrolyte or LFT abnormalities.  Normal lipase.   Normal magnesium.  CBC shows hemoglobin at 9.3.  Patient reports that she was seen at her GI's office and her hemoglobin was 9.5.  This seems to be improved from her previous.  Platelets normal.  No leukocytosis.  Urinalysis shows hazy urine but normal super gravity.  There is large leuks with 11-20 white blood cells with rare bacteria however there is 11-20 squamous epithelial seen.  Likely dirty catch.  Patient does not have any symptoms of dysuria  hematuria that would suggest a UTI.  CT of the abdomen pelvis without contrast shows mild diverticulitis of the proximal sigmoid colon. No other focal abnormality is noted.  Patient has been given 500 mL of LR.  I independently reviewed the patient's previous charts.  She had an echo in April 2023 that showed an EF of 60 to 65%.  Patient does not appear to be volume overloaded.  Considered admission, however patient's vital signs are stable.  She does not have leukocytosis.  Her pain is well controlled with Tylenol.  I do not think she would be a good good candidate for admission at this time and is safe for outpatient.  Patient is requesting Tylenol for pain.  We will give her some Tylenol as her last dose was before noon.  We will give the patient her first dose of Augmentin.  Spoke with Roderic Palau, [facility] and will renal dose given him Augmentin 500 twice daily for the next 7 days.  I discussed the lab and imaging his results with the patient and family member at bedside.  We discussed that she has diverticulitis which is requiring an antibiotic.  We discussed adhering to the antibiotic and take as prescribed.  Recommended Tylenol as needed for pain control.  Recommended follow-up with her GI specialist this week.  We discussed return precautions and red flag symptoms.  Patient and family member verbalized understanding and agreed to plan.  Patient is stable and discharged home in good condition.  I discussed this case with my attending physician who cosigned  this note including patient's presenting symptoms, physical exam, and planned diagnostics and interventions. Attending physician stated agreement with plan or made changes to plan which were implemented.   Final Clinical Impression(s) / ED Diagnoses Final diagnoses:  Diverticulitis    Rx / DC Orders ED Discharge Orders          Ordered    amoxicillin-clavulanate (AUGMENTIN) 500-125 MG tablet  2 times daily        04/07/22 2009              Sherrell Puller, Hershal Coria 04/07/22 2012    Gareth Morgan, MD 04/08/22 1027

## 2022-04-08 LAB — TYPE AND SCREEN
ABO/RH(D): A POS
Antibody Screen: NEGATIVE

## 2022-05-23 ENCOUNTER — Encounter (HOSPITAL_COMMUNITY): Payer: Self-pay

## 2022-05-23 ENCOUNTER — Ambulatory Visit (HOSPITAL_COMMUNITY)
Admission: RE | Admit: 2022-05-23 | Discharge: 2022-05-23 | Disposition: A | Discharge status: Home / Self Care | Payer: Medicare Other | Source: Ambulatory Visit | Attending: Internal Medicine | Admitting: Internal Medicine

## 2022-05-23 DIAGNOSIS — C3490 Malignant neoplasm of unspecified part of unspecified bronchus or lung: Secondary | ICD-10-CM | POA: Insufficient documentation

## 2022-05-23 LAB — POCT I-STAT CREATININE: Creatinine, Ser: 1.6 mg/dL — ABNORMAL HIGH (ref 0.44–1.00)

## 2022-05-23 MED ORDER — SODIUM CHLORIDE (PF) 0.9 % IJ SOLN
INTRAMUSCULAR | Status: AC
  Filled 2022-05-23: qty 50

## 2022-05-23 MED ORDER — IOHEXOL 300 MG/ML  SOLN
75.0000 mL | Freq: Once | INTRAMUSCULAR | Status: AC | PRN
  Administered 2022-05-23: 60 mL via INTRAVENOUS

## 2022-05-30 ENCOUNTER — Inpatient Hospital Stay (HOSPITAL_BASED_OUTPATIENT_CLINIC_OR_DEPARTMENT_OTHER): Payer: Medicare Other | Admitting: Internal Medicine

## 2022-05-30 ENCOUNTER — Inpatient Hospital Stay: Payer: Medicare Other | Attending: Internal Medicine

## 2022-05-30 VITALS — BP 179/56 | HR 56 | Temp 97.8°F | Resp 17 | Ht 62.0 in | Wt 149.5 lb

## 2022-05-30 DIAGNOSIS — C3490 Malignant neoplasm of unspecified part of unspecified bronchus or lung: Secondary | ICD-10-CM | POA: Diagnosis not present

## 2022-05-30 DIAGNOSIS — I129 Hypertensive chronic kidney disease with stage 1 through stage 4 chronic kidney disease, or unspecified chronic kidney disease: Secondary | ICD-10-CM | POA: Insufficient documentation

## 2022-05-30 DIAGNOSIS — C3431 Malignant neoplasm of lower lobe, right bronchus or lung: Secondary | ICD-10-CM | POA: Diagnosis present

## 2022-05-30 DIAGNOSIS — D509 Iron deficiency anemia, unspecified: Secondary | ICD-10-CM | POA: Diagnosis not present

## 2022-05-30 DIAGNOSIS — N189 Chronic kidney disease, unspecified: Secondary | ICD-10-CM | POA: Insufficient documentation

## 2022-05-30 LAB — CBC WITH DIFFERENTIAL (CANCER CENTER ONLY)
Abs Immature Granulocytes: 0.02 10*3/uL (ref 0.00–0.07)
Basophils Absolute: 0 10*3/uL (ref 0.0–0.1)
Basophils Relative: 1 %
Eosinophils Absolute: 0.4 10*3/uL (ref 0.0–0.5)
Eosinophils Relative: 6 %
HCT: 31.8 % — ABNORMAL LOW (ref 36.0–46.0)
Hemoglobin: 10.7 g/dL — ABNORMAL LOW (ref 12.0–15.0)
Immature Granulocytes: 0 %
Lymphocytes Relative: 34 %
Lymphs Abs: 2.4 10*3/uL (ref 0.7–4.0)
MCH: 30.7 pg (ref 26.0–34.0)
MCHC: 33.6 g/dL (ref 30.0–36.0)
MCV: 91.4 fL (ref 80.0–100.0)
Monocytes Absolute: 0.6 10*3/uL (ref 0.1–1.0)
Monocytes Relative: 9 %
Neutro Abs: 3.6 10*3/uL (ref 1.7–7.7)
Neutrophils Relative %: 50 %
Platelet Count: 298 10*3/uL (ref 150–400)
RBC: 3.48 MIL/uL — ABNORMAL LOW (ref 3.87–5.11)
RDW: 14.3 % (ref 11.5–15.5)
WBC Count: 7.1 10*3/uL (ref 4.0–10.5)
nRBC: 0 % (ref 0.0–0.2)

## 2022-05-30 LAB — CMP (CANCER CENTER ONLY)
ALT: 6 U/L (ref 0–44)
AST: 14 U/L — ABNORMAL LOW (ref 15–41)
Albumin: 4.2 g/dL (ref 3.5–5.0)
Alkaline Phosphatase: 92 U/L (ref 38–126)
Anion gap: 6 (ref 5–15)
BUN: 27 mg/dL — ABNORMAL HIGH (ref 8–23)
CO2: 30 mmol/L (ref 22–32)
Calcium: 9.8 mg/dL (ref 8.9–10.3)
Chloride: 103 mmol/L (ref 98–111)
Creatinine: 1.69 mg/dL — ABNORMAL HIGH (ref 0.44–1.00)
GFR, Estimated: 31 mL/min — ABNORMAL LOW (ref >60)
Glucose, Bld: 103 mg/dL — ABNORMAL HIGH (ref 70–99)
Potassium: 4.7 mmol/L (ref 3.5–5.1)
Sodium: 139 mmol/L (ref 135–145)
Total Bilirubin: 0.3 mg/dL (ref 0.3–1.2)
Total Protein: 7.5 g/dL (ref 6.5–8.1)

## 2022-05-30 LAB — IRON AND IRON BINDING CAPACITY (CC-WL,HP ONLY)
Iron: 54 ug/dL (ref 28–170)
Saturation Ratios: 16 % (ref 10.4–31.8)
TIBC: 336 ug/dL (ref 250–450)
UIBC: 282 ug/dL (ref 148–442)

## 2022-05-30 LAB — FERRITIN: Ferritin: 21 ng/mL (ref 11–307)

## 2022-05-30 NOTE — Progress Notes (Signed)
[name]:([phone]) [phone]   [phone]  OFFICE PROGRESS NOTE  [name], MD [name] [place] [address]  DIAGNOSIS:  1) stage IA non-small cell lung cancer, squamous cell carcinoma presented with right lower lobe pulmonary nodule diagnosed in August 2017. 2) persistent anemia questionable for anemia of chronic disease plus/minus iron deficiency.  PRIOR THERAPY:  1) Feraheme infusion on as-needed basis. 2) stereotactic radiotherapy to the right lower lobe lung nodule under the care of Dr. Tammi Klippel.  CURRENT THERAPY: Observation.  INTERVAL HISTORY: [name] 81 y.o. female returns to the clinic today for follow-up visit accompanied by her daughter.  The patient is feeling fine today with no concerning complaints.  She was treated recently for pneumonia.  She denied having any current chest pain, shortness of breath, cough or hemoptysis.  She has no nausea, vomiting, diarrhea or constipation.  She has no headache or visual changes.  She has no recent weight loss or night sweats.  She has been on treatment with Feraheme for the iron deficiency anemia as well as anemia of chronic disease.  The patient had repeat CT scan of the chest performed recently and she is here for evaluation and discussion of her scan results.  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   Asthma    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   Blood in stool    last week of aug 2018   CAP (community acquired pneumonia) 10/22/2015   Chronic kidney disease    COPD (chronic obstructive pulmonary disease) ([facility])    Coronary artery disease    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   Diverticulitis    Dyspnea    with exertion   Gastroesophageal reflux disease    occ   Glaucoma    right eye   Headache syndrome  11/27/2018   Hiatal hernia    History of shingles 06/23/2018   Hyperlipidemia    Hypertension    Lung cancer ([place]) dx 2018   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   Myocardial infarction ([place]) 01/01/2000   Cardiac catheterization   Peripheral vascular disease ([place])    stents in legs x 2 or 3   Pneumonia    last time winter 2017 -2018   PONV (postoperative nausea and vomiting)    occassionally, last colonscopy did ok with anesthesia   Primary cancer of right lower lobe of lung ([place]) 04/25/2016   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  MEDICATIONS:  Current Outpatient Medications  Medication Sig Dispense Refill   acetaminophen (TYLENOL) 500 MG tablet Take 500-1,000 mg by mouth every 6 (six) hours as needed (for pain).     albuterol (VENTOLIN HFA) 108 (90 Base) MCG/ACT inhaler Inhale 2 puffs into the lungs every 6 (six) hours as needed for wheezing or shortness of breath. 8 g 6   amoxicillin-clavulanate (AUGMENTIN) 500-125 MG tablet Take 1 tablet (500 mg total) by mouth 2 (two) times daily. 14 tablet 0   atorvastatin (LIPITOR) 40 MG tablet Take 1 tablet (40  mg total) by mouth at bedtime. 30 tablet 6   cetirizine (ZYRTEC) 10 MG tablet Take 10 mg by mouth daily as needed for allergies.      clopidogrel (PLAVIX) 75 MG tablet Take 1 tablet (75 mg total) by mouth daily. (Patient taking differently: Take 37.5 mg by mouth in the morning.) 90 tablet 3   dexlansoprazole (DEXILANT) 60 MG capsule Take 1 capsule (60 mg total) by mouth daily before breakfast. 30 capsule 11   fluorouracil (EFUDEX) 5 % cream Apply topically.     Fluticasone-Umeclidin-Vilant  (TRELEGY ELLIPTA) 200-62.5-25 MCG/ACT AEPB Inhale 1 puff into the lungs daily. 180 each 3   furosemide (LASIX) 40 MG tablet Take 40 mg by mouth in the morning.     Magnesium Oxide 400 MG CAPS Take 400 mg by mouth in the morning.     nitroGLYCERIN (NITROSTAT) 0.4 MG SL tablet Place 0.4 mg under the tongue every 5 (five) minutes x 3 doses as needed for chest pain.     olmesartan (BENICAR) 20 MG tablet Take 1 tablet (20 mg total) by mouth daily. (Patient taking differently: Take 10 mg by mouth in the morning.) 90 tablet 1   ondansetron (ZOFRAN-ODT) 4 MG disintegrating tablet Take 4 mg by mouth every 8 (eight) hours as needed for nausea or vomiting (dissolve in the mouth).     No current facility-administered medications for this visit.    SURGICAL HISTORY:  Past Surgical History:  Procedure Laterality Date   ABDOMINAL HYSTERECTOMY     APPENDECTOMY     Arnold-chiari malformation repair  1998   Suboccipital craniectomy   CAROTID ENDARTERECTOMY  03/29/2010   Left  CEA   CHOLECYSTECTOMY     Gall Bladder   COLONOSCOPY WITH PROPOFOL N/A 04/22/2015   Procedure: COLONOSCOPY WITH PROPOFOL;  Surgeon: Carol Ada, MD;  Location: [facility];  Service: Endoscopy;  Laterality: N/A;   COLONOSCOPY WITH PROPOFOL N/A 05/25/2016   Procedure: COLONOSCOPY WITH PROPOFOL;  Surgeon: Carol Ada, MD;  Location: [facility];  Service: Endoscopy;  Laterality: N/A;   COLONOSCOPY WITH PROPOFOL N/A 05/03/2017   Procedure: COLONOSCOPY WITH PROPOFOL;  Surgeon: Carol Ada, MD;  Location: [facility];  Service: Endoscopy;  Laterality: N/A;   COLONOSCOPY WITH PROPOFOL N/A 06/29/2020   Procedure: COLONOSCOPY WITH PROPOFOL;  Surgeon: Carol Ada, MD;  [facility];  Service: Endoscopy;  Laterality: N/A;   COLONOSCOPY WITH PROPOFOL N/A 01/05/2021   Procedure: COLONOSCOPY WITH PROPOFOL;  Surgeon: Carol Ada, MD;  [facility];  Service: Endoscopy;  Laterality: N/A;   COLONOSCOPY WITH PROPOFOL N/A  09/28/2021   Procedure: COLONOSCOPY WITH PROPOFOL;  Surgeon: Carol Ada, MD;  Location: [facility];  Service: Endoscopy;  Laterality: N/A;   CORNEAL TRANSPLANT     Right   CORONARY ARTERY BYPASS GRAFT  01/01/2000   x 3   ENDARTERECTOMY Right 09/26/2021   Procedure: RIGHT CAROTID ENDARTERECTOMY;  Surgeon: [name], MD;  Location: [facility];  Service: Vascular;  Laterality: Right;   ENTEROSCOPY N/A 02/27/2018   Procedure: ENTEROSCOPY;  Surgeon: Carol Ada, MD;  [facility];  Service: Endoscopy;  Laterality: N/A;   ENTEROSCOPY N/A 07/18/2018   Procedure: ENTEROSCOPY;  Surgeon: Carol Ada, MD;  [facility];  Service: Endoscopy;  Laterality: N/A;   ENTEROSCOPY N/A 06/29/2020   Procedure: ENTEROSCOPY;  Surgeon: Carol Ada, MD;  [facility];  Service: Endoscopy;  Laterality: N/A;   ENTEROSCOPY N/A 12/05/2021   Procedure: ENTEROSCOPY;  Surgeon: Carol Ada, MD;  [facility];  Service: Gastroenterology;  Laterality: N/A;   ENTEROSCOPY N/A 12/25/2021   Procedure: ENTEROSCOPY;  Surgeon: Carol Ada, MD;  [facility];  Service: Gastroenterology;  Laterality: N/A;  with endoscopic placement of video capsule   ESOPHAGOGASTRODUODENOSCOPY N/A 05/25/2016   Procedure: ESOPHAGOGASTRODUODENOSCOPY (EGD);  Surgeon: Carol Ada, MD;  Location: [facility];  Service: Endoscopy;  Laterality: N/A;   ESOPHAGOGASTRODUODENOSCOPY N/A 08/01/2018   Procedure: ESOPHAGOGASTRODUODENOSCOPY (EGD);  Surgeon: Carol Ada, MD;  Location: [facility];  Service: Endoscopy;  Laterality: N/A;   ESOPHAGOGASTRODUODENOSCOPY (EGD) WITH PROPOFOL N/A 09/28/2021   Procedure: ESOPHAGOGASTRODUODENOSCOPY (EGD) WITH PROPOFOL;  Surgeon: Carol Ada, MD;  Location: [facility];  Service: Endoscopy;  Laterality: N/A;   EYE SURGERY Right 1995 or 1996   Laser surgery for retinal hemorrhage   GIVENS CAPSULE STUDY N/A 07/16/2018   Procedure: GIVENS CAPSULE STUDY;   Surgeon: Carol Ada, MD;  [facility];  Service: Endoscopy;  Laterality: N/A;   GIVENS CAPSULE STUDY N/A 12/14/2021   Procedure: GIVENS CAPSULE STUDY;  Surgeon: Carol Ada, MD;  Location: [facility];  Service: Gastroenterology;  Laterality: N/A;   GIVENS CAPSULE STUDY N/A 12/25/2021   Procedure: GIVENS CAPSULE STUDY;  Surgeon: Carol Ada, MD;  [facility];  Service: Gastroenterology;  Laterality: N/A;   HEMOSTASIS CLIP PLACEMENT  06/29/2020   Procedure: HEMOSTASIS CLIP PLACEMENT;  Surgeon: Carol Ada, MD;  [facility];  Service: Endoscopy;;   HEMOSTASIS CLIP PLACEMENT  01/05/2021   Procedure: HEMOSTASIS CLIP PLACEMENT;  Surgeon: Carol Ada, MD;  [facility];  Service: Endoscopy;;   HOT HEMOSTASIS N/A 02/27/2018   Procedure: HOT HEMOSTASIS (ARGON PLASMA COAGULATION/BICAP);  Surgeon: Carol Ada, MD;  Location: [facility];  Service: Endoscopy;  Laterality: N/A;   HOT HEMOSTASIS N/A 08/01/2018   Procedure: HOT HEMOSTASIS (ARGON PLASMA COAGULATION/BICAP);  Surgeon: Carol Ada, MD;  Location: [facility];  Service: Endoscopy;  Laterality: N/A;   HOT HEMOSTASIS N/A 06/29/2020   Procedure: HOT HEMOSTASIS (ARGON PLASMA COAGULATION/BICAP);  Surgeon: Carol Ada, MD;  Location: [facility];  Service: Endoscopy;  Laterality: N/A;   HOT HEMOSTASIS N/A 01/05/2021   Procedure: HOT HEMOSTASIS (ARGON PLASMA COAGULATION/BICAP);  Surgeon: Carol Ada, MD;  Location: [facility];  Service: Endoscopy;  Laterality: N/A;   HOT HEMOSTASIS N/A 12/05/2021   Procedure: HOT HEMOSTASIS (ARGON PLASMA COAGULATION/BICAP);  Surgeon: Carol Ada, MD;  Location: [facility];  Service: Gastroenterology;  Laterality: N/A;   IR RADIOLOGIST EVAL & MGMT  12/14/2016   IR RADIOLOGIST EVAL & MGMT  07/26/2021   LEFT HEART CATH AND CORS/GRAFTS ANGIOGRAPHY N/A 01/09/2018   Procedure: LEFT HEART CATH AND CORS/GRAFTS ANGIOGRAPHY;  Surgeon: Charolette Forward, MD;  Location: [facility];  Service: Cardiovascular;  Laterality: N/A;   LEFT HEART CATHETERIZATION WITH CORONARY ANGIOGRAM N/A 08/03/2014   Procedure: LEFT HEART CATHETERIZATION WITH CORONARY ANGIOGRAM;  Surgeon: Birdie Riddle, MD;  Location: [facility];  Service: Cardiovascular;  Laterality: N/A;   PATCH ANGIOPLASTY Right 09/26/2021   Procedure: PATCH ANGIOPLASTY RIGHT CAROTID;  Surgeon: [name], MD;  Location: [facility];  Service: Vascular;  Laterality: Right;   Post Coronary Artery  BPG  01/05/2000   Right jugular sheath removed   PR VEIN BYPASS GRAFT,AORTO-FEM-POP     ROTATOR CUFF REPAIR     Right    REVIEW OF SYSTEMS:  A comprehensive review of systems was negative except for: Constitutional: positive for fatigue   PHYSICAL EXAMINATION: General appearance: alert, cooperative, fatigued, and no distress Head:  Normocephalic, without obvious abnormality, atraumatic Neck: no adenopathy, no JVD, supple, symmetrical, trachea midline, and thyroid not enlarged, symmetric, no tenderness/mass/nodules Lymph nodes: Cervical, supraclavicular, and axillary nodes normal. Resp: clear to auscultation bilaterally Back: symmetric, no curvature. ROM normal. No CVA tenderness. Cardio: regular rate and rhythm, S1, S2 normal, no murmur, click, rub or gallop GI: soft, non-tender; bowel sounds normal; no masses,  no organomegaly Extremities: extremities normal, atraumatic, no cyanosis or edema  ECOG PERFORMANCE STATUS: 1 - Symptomatic but completely ambulatory  Blood pressure (!) 179/56, pulse (!) 56, temperature 97.8 F (36.6 C), temperature source Oral, resp. rate 17, height 5\' 2"  (1.575 m), weight 149 lb 8 oz (67.8 kg), SpO2 99 %.  LABORATORY DATA: Lab Results  Component Value Date   WBC 7.1 05/30/2022   HGB 10.7 (L) 05/30/2022   HCT 31.8 (L) 05/30/2022   MCV 91.4 05/30/2022   PLT 298 05/30/2022      Chemistry      Component Value Date/Time   NA 139 05/30/2022 0943   NA 139 08/02/2017 0821    K 4.7 05/30/2022 0943   K 4.3 08/02/2017 0821   CL 103 05/30/2022 0943   CO2 30 05/30/2022 0943   CO2 24 08/02/2017 0821   BUN 27 (H) 05/30/2022 0943   BUN 14.4 08/02/2017 0821   CREATININE 1.69 (H) 05/30/2022 0943   CREATININE 1.3 (H) 08/02/2017 0821      Component Value Date/Time   CALCIUM 9.8 05/30/2022 0943   CALCIUM 9.2 08/02/2017 0821   ALKPHOS 92 05/30/2022 0943   ALKPHOS 66 08/02/2017 0821   AST 14 (L) 05/30/2022 0943   AST 14 08/02/2017 0821   ALT 6 05/30/2022 0943   ALT 8 08/02/2017 0821   BILITOT 0.3 05/30/2022 0943   BILITOT 0.34 08/02/2017 0821       RADIOGRAPHIC STUDIES: CT Chest W Contrast  Result Date: 05/24/2022 CLINICAL DATA:  Non-small cell lung cancer staging. * Tracking Code: BO * EXAM: CT CHEST WITH CONTRAST TECHNIQUE: Multidetector CT imaging of the chest was performed during intravenous contrast administration. RADIATION DOSE REDUCTION: This exam was performed according to the departmental dose-optimization program which includes automated exposure control, adjustment of the mA and/or kV according to patient size and/or use of iterative reconstruction technique. CONTRAST:  66mL OMNIPAQUE IOHEXOL 300 MG/ML  SOLN COMPARISON:  CT 02/22/2022 and 08/22/2021. FINDINGS: Cardiovascular: No acute vascular findings identified. Extensive atherosclerosis of the aorta, great vessels and coronary arteries status post median sternotomy and CABG. Probable calcifications of the aortic valve. The heart size is normal. There is no pericardial effusion. Mediastinum/Nodes: There are no enlarged mediastinal, hilar or axillary lymph nodes. The thyroid gland, trachea and esophagus demonstrate no significant findings. Lungs/Pleura: No pleural effusion or pneumothorax. Mild centrilobular and paraseptal emphysema with stable biapical scarring and mild diffuse central airway thickening. Stable treated right lower lobe nodule which measures approximately 3.0 x 1.9 cm on image 78/7. The sub  solid left lower lobe nodule seen on the most recent study has resolved. There are no new or enlarging pulmonary nodules. A partially calcified lesion anteriorly in the right upper lobe is unchanged. Upper abdomen: The visualized upper abdomen appears stable without suspicious findings. A lesion in the left hepatic lobe appears unchanged and is considered benign. There is chronic extrahepatic biliary dilatation post cholecystectomy. Musculoskeletal/Chest wall: There is a chronic fracture of the right 7th rib posteriorly with incomplete healing and adjacent pleural thickening. This appears unchanged from at least 02/23/2019, and there is  no associated soft tissue mass. No acute osseous findings are evident. IMPRESSION: 1. Stable treated right lower lobe pulmonary nodule. 2. No evidence of local recurrence or metastatic disease. The lesion of concern in the left lower lobe on the most recent study has resolved, consistent with a focus of inflammation. 3. Chronic fracture of the right 7th rib posteriorly with incomplete healing and adjacent pleural thickening. 4. Stable extrahepatic biliary dilatation post cholecystectomy. 5. Aortic Atherosclerosis (ICD10-I70.0) and Emphysema (ICD10-J43.9). Electronically Signed   By: Richardean Sale M.D.   On: 05/24/2022 12:17     ASSESSMENT AND PLAN:  This is a very pleasant 81 years old white female with a stage IA non-small cell lung cancer, squamous cell carcinoma presented with right lower lobe pulmonary nodule status post stereotactic radiotherapy.  The patient is currently on observation and she is feeling fine with no concerning complaints except for the baseline shortness of breath and fatigue from the anemia. The patient had repeat CT scan of the chest performed recently.  I personally and independently reviewed the scan and discussed the result with the patient and her daughter. Her scan showed no concerning findings for disease recurrence and the previously seen  lesion on the previous studies resolved in the interval and was consistent with a focus of inflammation. I recommended for the patient to continue on observation with repeat CT scan of the chest in 1 year. For the iron deficiency anemia, she received iron infusion we will continue to monitor her blood count closely if she develop any significant fatigue or weakness. The patient was advised to call immediately if she has any other concerning symptoms in the interval.  All questions were answered. The patient knows to call the clinic with any problems, questions or concerns. We can certainly see the patient much sooner if necessary.  Disclaimer: This note was dictated with voice recognition software. Similar sounding words can inadvertently be transcribed and may not be corrected upon review.

## 2022-07-13 ENCOUNTER — Encounter (HOSPITAL_COMMUNITY): Payer: Self-pay | Admitting: Interventional Radiology

## 2022-07-13 ENCOUNTER — Telehealth (HOSPITAL_COMMUNITY): Payer: Self-pay | Admitting: Radiology

## 2022-07-13 NOTE — Telephone Encounter (Signed)
Pt called with new complaints of dizziness, as well as other issues. She is concerned about her aneurysms and would like to know what Deveshwar recommends she do. She is due for f/u [facility] head wo this month. Asked PA, Anderson Malta to see if Dev would like to do something different and sooner and call pt with that info. JM

## 2022-07-16 ENCOUNTER — Encounter (HOSPITAL_COMMUNITY): Payer: Self-pay

## 2022-07-16 ENCOUNTER — Emergency Department (HOSPITAL_COMMUNITY): Payer: Medicare Other

## 2022-07-16 ENCOUNTER — Emergency Department (HOSPITAL_COMMUNITY)
Admission: EM | Admit: 2022-07-16 | Discharge: 2022-07-16 | Disposition: A | Discharge status: Home / Self Care | Payer: Medicare Other | Attending: Emergency Medicine | Admitting: Emergency Medicine

## 2022-07-16 DIAGNOSIS — Z7902 Long term (current) use of antithrombotics/antiplatelets: Secondary | ICD-10-CM | POA: Diagnosis not present

## 2022-07-16 DIAGNOSIS — R0789 Other chest pain: Secondary | ICD-10-CM | POA: Diagnosis not present

## 2022-07-16 DIAGNOSIS — R0609 Other forms of dyspnea: Secondary | ICD-10-CM | POA: Diagnosis not present

## 2022-07-16 DIAGNOSIS — R55 Syncope and collapse: Secondary | ICD-10-CM | POA: Insufficient documentation

## 2022-07-16 DIAGNOSIS — R079 Chest pain, unspecified: Secondary | ICD-10-CM | POA: Diagnosis present

## 2022-07-16 DIAGNOSIS — I10 Essential (primary) hypertension: Secondary | ICD-10-CM | POA: Diagnosis not present

## 2022-07-16 DIAGNOSIS — Z85118 Personal history of other malignant neoplasm of bronchus and lung: Secondary | ICD-10-CM | POA: Insufficient documentation

## 2022-07-16 DIAGNOSIS — E119 Type 2 diabetes mellitus without complications: Secondary | ICD-10-CM | POA: Insufficient documentation

## 2022-07-16 LAB — CBC WITH DIFFERENTIAL/PLATELET
Abs Immature Granulocytes: 0.02 10*3/uL (ref 0.00–0.07)
Basophils Absolute: 0.1 10*3/uL (ref 0.0–0.1)
Basophils Relative: 1 %
Eosinophils Absolute: 0.4 10*3/uL (ref 0.0–0.5)
Eosinophils Relative: 4 %
HCT: 31.2 % — ABNORMAL LOW (ref 36.0–46.0)
Hemoglobin: 9.9 g/dL — ABNORMAL LOW (ref 12.0–15.0)
Immature Granulocytes: 0 %
Lymphocytes Relative: 39 %
Lymphs Abs: 4.1 10*3/uL — ABNORMAL HIGH (ref 0.7–4.0)
MCH: 29.2 pg (ref 26.0–34.0)
MCHC: 31.7 g/dL (ref 30.0–36.0)
MCV: 92 fL (ref 80.0–100.0)
Monocytes Absolute: 0.6 10*3/uL (ref 0.1–1.0)
Monocytes Relative: 6 %
Neutro Abs: 5.3 10*3/uL (ref 1.7–7.7)
Neutrophils Relative %: 50 %
Platelets: 308 10*3/uL (ref 150–400)
RBC: 3.39 MIL/uL — ABNORMAL LOW (ref 3.87–5.11)
RDW: 14.1 % (ref 11.5–15.5)
WBC: 10.4 10*3/uL (ref 4.0–10.5)
nRBC: 0 % (ref 0.0–0.2)

## 2022-07-16 LAB — BASIC METABOLIC PANEL
Anion gap: 10 (ref 5–15)
BUN: 28 mg/dL — ABNORMAL HIGH (ref 8–23)
CO2: 23 mmol/L (ref 22–32)
Calcium: 9.3 mg/dL (ref 8.9–10.3)
Chloride: 105 mmol/L (ref 98–111)
Creatinine, Ser: 1.79 mg/dL — ABNORMAL HIGH (ref 0.44–1.00)
GFR, Estimated: 28 mL/min — ABNORMAL LOW (ref >60)
Glucose, Bld: 141 mg/dL — ABNORMAL HIGH (ref 70–99)
Potassium: 4 mmol/L (ref 3.5–5.1)
Sodium: 138 mmol/L (ref 135–145)

## 2022-07-16 LAB — TROPONIN I (HIGH SENSITIVITY)
Troponin I (High Sensitivity): 6 ng/L (ref <18)
Troponin I (High Sensitivity): 9 ng/L (ref <18)

## 2022-07-16 LAB — BRAIN NATRIURETIC PEPTIDE: B Natriuretic Peptide: 45.3 pg/mL (ref 0.0–100.0)

## 2022-07-16 MED ORDER — ACETAMINOPHEN 325 MG PO TABS
650.0000 mg | ORAL_TABLET | Freq: Once | ORAL | Status: AC
  Administered 2022-07-16: 650 mg via ORAL
  Filled 2022-07-16: qty 2

## 2022-07-16 NOTE — Discharge Instructions (Signed)
As discussed, your evaluation today has been largely reassuring.  But, it is important that you monitor your condition carefully, and do not hesitate to return to the ED if you develop new, or concerning changes in your condition.  Otherwise, please follow-up with your physician for appropriate ongoing care tomorrow.

## 2022-07-16 NOTE — ED Provider Notes (Signed)
[facility] EMERGENCY DEPARTMENT Provider Note   CSN: [id number] Arrival date & time: 07/16/22  1153     History  Chief Complaint  Patient presents with   Chest Pain   Nausea   Shortness of Breath    [name] is a 81 y.o. female.  HPI Patient with multiple medical problems presents with chest pain.  She was shopping today when she felt chest pain.  About that time she felt lightheaded, near syncope, but did not actually lose consciousness.  Since that time pain has been waxing and waning in severity, but essentially present.  Noted the patient saw her cardiologist 4 days ago, due to prior episode of chest pain, lightheadedness.    Home Medications Prior to Admission medications   Medication Sig Start Date End Date Taking? Authorizing Provider  acetaminophen (TYLENOL) 500 MG tablet Take 500-1,000 mg by mouth every 6 (six) hours as needed (for pain).    [provider]  albuterol (VENTOLIN HFA) 108 (90 Base) MCG/ACT inhaler Inhale 2 puffs into the lungs every 6 (six) hours as needed for wheezing or shortness of breath. 05/25/20   Icard, Leory Plowman L, DO  amoxicillin-clavulanate (AUGMENTIN) 500-125 MG tablet Take 1 tablet (500 mg total) by mouth 2 (two) times daily. 04/07/22   Sherrell Puller, PA-C  atorvastatin (LIPITOR) 40 MG tablet Take 1 tablet (40 mg total) by mouth at bedtime. 09/29/21   Rhyne, Hulen Shouts, PA-C  cetirizine (ZYRTEC) 10 MG tablet Take 10 mg by mouth daily as needed for allergies.     [provider]  clopidogrel (PLAVIX) 75 MG tablet Take 1 tablet (75 mg total) by mouth daily. Patient taking differently: Take 37.5 mg by mouth in the morning. 07/08/20   Mercy Riding, MD  dexlansoprazole (DEXILANT) 60 MG capsule Take 1 capsule (60 mg total) by mouth daily before breakfast. 11/17/12   Tanda Rockers, MD  fluorouracil (EFUDEX) 5 % cream Apply topically. 04/25/22   [provider]  Fluticasone-Umeclidin-Vilant (TRELEGY  ELLIPTA) 200-62.5-25 MCG/ACT AEPB Inhale 1 puff into the lungs daily. 06/15/21   Garner Nash, DO  furosemide (LASIX) 40 MG tablet Take 40 mg by mouth in the morning. 01/30/21   [provider]  Magnesium Oxide 400 MG CAPS Take 400 mg by mouth in the morning.    [provider]  nitroGLYCERIN (NITROSTAT) 0.4 MG SL tablet Place 0.4 mg under the tongue every 5 (five) minutes x 3 doses as needed for chest pain.    [provider]  olmesartan (BENICAR) 20 MG tablet Take 1 tablet (20 mg total) by mouth daily. Patient taking differently: Take 10 mg by mouth in the morning. 07/04/20   Mercy Riding, MD  ondansetron (ZOFRAN-ODT) 4 MG disintegrating tablet Take 4 mg by mouth every 8 (eight) hours as needed for nausea or vomiting (dissolve in the mouth). 03/29/21   [provider]      Allergies    Avelox [moxifloxacin hcl in nacl], Azithromycin, Codeine, Doxycycline, Hydromorphone, Levaquin [levofloxacin], Vitamin d analogs, Nifedipine er, Octreotide, Oxycodone-acetaminophen, and Risedronate    Review of Systems   Review of Systems  All other systems reviewed and are negative.   Physical Exam Updated Vital Signs BP (!) 127/45   Pulse 65   Temp 98.6 F (37 C) (Oral)   Resp 16   Ht 5\' 2"  (1.575 m)   Wt 65.3 kg   SpO2 96%   BMI 26.34 kg/m  Physical Exam Vitals  and nursing note reviewed.  Constitutional:      General: She is not in acute distress.    Appearance: She is well-developed.  HENT:     Head: Normocephalic and atraumatic.  Eyes:     Conjunctiva/sclera: Conjunctivae normal.  Cardiovascular:     Rate and Rhythm: Normal rate and regular rhythm.  Pulmonary:     Effort: Pulmonary effort is normal. No respiratory distress.     Breath sounds: Normal breath sounds. No stridor.  Abdominal:     General: There is no distension.  Skin:    General: Skin is warm and dry.  Neurological:     Mental Status: She is alert and oriented to person, place,  and time.     Cranial Nerves: No cranial nerve deficit.  Psychiatric:        Mood and Affect: Mood normal.     ED Results / Procedures / Treatments   Labs (all labs ordered are listed, but only abnormal results are displayed) Labs Reviewed  CBC WITH DIFFERENTIAL/PLATELET - Abnormal; Notable for the following components:      Result Value   RBC 3.39 (*)    Hemoglobin 9.9 (*)    HCT 31.2 (*)    Lymphs Abs 4.1 (*)    All other components within normal limits  BASIC METABOLIC PANEL - Abnormal; Notable for the following components:   Glucose, Bld 141 (*)    BUN 28 (*)    Creatinine, Ser 1.79 (*)    GFR, Estimated 28 (*)    All other components within normal limits  BRAIN NATRIURETIC PEPTIDE  CBG MONITORING, ED  TROPONIN I (HIGH SENSITIVITY)  TROPONIN I (HIGH SENSITIVITY)    EKG EKG Interpretation  Date/Time:  Monday July 16 2022 12:11:46 EST Ventricular Rate:  88 PR Interval:  150 QRS Duration: 82 QT Interval:  346 QTC Calculation: 418 R Axis:   57 Text Interpretation: Normal sinus rhythm Nonspecific ST abnormality Abnormal ECG Confirmed by Carmin Muskrat [phone]) on 07/16/2022 4:55:42 PM  Radiology DG Chest 2 View  Result Date: 07/16/2022 CLINICAL DATA:  Chest pain, lung cancer EXAM: CHEST - 2 VIEW COMPARISON:  10/28/2021 chest radiograph. FINDINGS: Stable discontinuity in the lower most sternotomy wires, which are otherwise intact. Stable cardiomediastinal silhouette with normal heart size. No pneumothorax. No pleural effusion. Stable nodular right infrahilar focus of consolidation. No pulmonary edema. No acute consolidative airspace disease. Cholecystectomy clips are seen in the right upper quadrant of the abdomen. IMPRESSION: 1. No acute cardiopulmonary disease. 2. Stable nodular right infrahilar focus of consolidation, most compatible with stable post treatment change. Electronically Signed   By: Ilona Sorrel M.D.   On: 07/16/2022 12:35    Procedures Procedures     Medications Ordered in ED Medications  acetaminophen (TYLENOL) tablet 650 mg (650 mg Oral Given 07/16/22 1236)    ED Course/ Medical Decision Making/ A&P This patient with a Hx of multiple medical issues including hypertension, diabetes and prior squamous cell lung cancer, 2017, status post resection, no active malignancy, presents to the ED for concern of pain, near syncope, this involves an extensive number of treatment options, and is a complaint that carries with it a high risk of complications and morbidity.    The differential diagnosis includes arrhythmia, ACS, infection, bacteremia, sepsis, dehydration   Social Determinants of Health:  Advanced age  Additional history obtained:  Additional history and/or information obtained from daughter at bedside, notable for as included in HPI. Additional info including echocardiogram preserved  ejection fraction from last year, catheterization, 2019 included below:  Ost LAD to Prox LAD lesion is 80% stenosed. Ost 1st Diag lesion is 100% stenosed. Prox RCA lesion is 20% stenosed. Mid RCA lesion is 30% stenosed. Mid LM to Dist LM lesion is 40% stenosed. Ost Ramus lesion is 40% stenosed. Ost Cx lesion is 40% stenosed. Origin lesion is 100% stenosed. Origin to Prox Graft lesion is 100% stenosed. The left ventricular systolic function is normal. LV end diastolic pressure is normal. The left ventricular ejection fraction is 50-55% by visual estimate.  After the initial evaluation, orders, including: Labs monitoring ECG were initiated.   Patient placed on Cardiac and Pulse-Oximetry Monitors. The patient was maintained on a cardiac monitor.  The cardiac monitored showed an rhythm of 90 sinus normal The patient was also maintained on pulse oximetry. The readings were typically 100% room air normal   On repeat evaluation of the patient stayed the same  Lab Tests:  I personally interpreted labs.  The pertinent results include:  Generally unremarkable, normal troponin values x2, mild anemia, not new  Imaging Studies ordered:  I independently visualized and interpreted imaging which showed unremarkable chest x-ray I agree with the radiologist interpretation  Consultations Obtained:  I requested consultation with the cardiology,  and discussed lab and imaging findings as well as pertinent plan - they recommend: Admission with cardiology following as a consulting team  Dispostion / Final MDM:  After consideration of the diagnostic results and the patient's response to treatment, this adult female with multiple medical issues, including advanced age presents with ongoing waxing, waning severity of chest pain, episode of near syncope earlier today.  Here patient is awake and alert, but with her history, I discussed case with her cardiologist who is available to see the patient tomorrow morning.  Final Clinical Impression(s) / ED Diagnoses Final diagnoses:  Atypical chest pain  Near syncope     Carmin Muskrat, MD 07/16/22 1806

## 2022-07-16 NOTE — ED Provider Triage Note (Signed)
Emergency Medicine Provider Triage Evaluation Note  [name] , a 81 y.o. female  was evaluated in triage.  Pt complains of chest pain.  Patient states that she has had intermittent chest pain for particularly since Friday.  She describes being at the grocery store today when she had a sudden onset of severe central chest heaviness.  She states that she also broke out in a cold sweat.  Afterward began having pain between her scapula.  She left and went to a fire station who gave her nitroglycerin and called EMS.  She states that her chest pain has subdued just a little bit.  She also endorses shortness of breath.  Denies palpitations, cough or fever, syncope.  Review of Systems  Positive: See above Negative:   Physical Exam  BP 111/60   Pulse 81   Temp (!) 97.4 F (36.3 C) (Oral)   Resp 16   Ht 5\' 2"  (1.575 m)   Wt 65.3 kg   SpO2 98%   BMI 26.34 kg/m  Gen:   Awake, no distress   Resp:  Mildly tachypneic, ?  Faint crackles in the left lung base MSK:   Moves extremities without difficulty  Other:  S1/S2 without murmur, radial pulses equal bilaterally.  Trace edema to the lower extremities.  Medical Decision Making  Medically screening exam initiated at 12:14 PM.  Appropriate orders placed.  [name] was informed that the remainder of the evaluation will be completed by another provider, this initial triage assessment does not replace that evaluation, and the importance of remaining in the ED until their evaluation is complete.     Mickie Hillier, PA-C 07/16/22 1216

## 2022-07-16 NOTE — ED Triage Notes (Signed)
Pt to ED via EMS from firestation. Pt has hx of CHF lung CA, Reports was out doing grocery shopping, had an episode 10/10 Cold sweat/ chest pressure. Got back in vehicle and pulled over to closest firestation. PTAR there first, gave 1 nitro, pt reports minimal relief. Wheezing noted to left lung files, neb treatment given by EMS. Pt reports feels significantly better after neb. Also c/o nausea. 4mg  Zofran given by EMS. EKG-NSR 150/70, 70, #20RFA. Reports similar episodes over past couple of days

## 2022-07-16 NOTE — ED Notes (Signed)
Pt d/c home with visitor per MD order. Discharge summary reviewed, verbalize understanding. No s/s of distress noted at discharge.

## 2022-07-17 ENCOUNTER — Other Ambulatory Visit (HOSPITAL_COMMUNITY): Payer: Self-pay | Admitting: Interventional Radiology

## 2022-07-17 DIAGNOSIS — I771 Stricture of artery: Secondary | ICD-10-CM

## 2022-07-17 DIAGNOSIS — R5383 Other fatigue: Secondary | ICD-10-CM

## 2022-07-17 DIAGNOSIS — I671 Cerebral aneurysm, nonruptured: Secondary | ICD-10-CM

## 2022-07-18 ENCOUNTER — Encounter (HOSPITAL_COMMUNITY): Payer: Self-pay | Admitting: Cardiology

## 2022-07-18 ENCOUNTER — Other Ambulatory Visit (HOSPITAL_COMMUNITY): Payer: Self-pay | Admitting: Cardiology

## 2022-07-18 ENCOUNTER — Encounter: Payer: Self-pay | Admitting: Physician Assistant

## 2022-07-18 DIAGNOSIS — R079 Chest pain, unspecified: Secondary | ICD-10-CM

## 2022-07-19 ENCOUNTER — Encounter: Payer: Self-pay | Admitting: Physician Assistant

## 2022-07-23 ENCOUNTER — Ambulatory Visit (HOSPITAL_COMMUNITY)
Admission: RE | Admit: 2022-07-23 | Discharge: 2022-07-23 | Disposition: A | Discharge status: Home / Self Care | Payer: Medicare Other | Source: Ambulatory Visit | Attending: Cardiology | Admitting: Cardiology

## 2022-07-23 DIAGNOSIS — R079 Chest pain, unspecified: Secondary | ICD-10-CM | POA: Insufficient documentation

## 2022-07-23 MED ORDER — NITROGLYCERIN 0.4 MG SL SUBL
SUBLINGUAL_TABLET | SUBLINGUAL | Status: AC
  Filled 2022-07-23: qty 1

## 2022-07-23 MED ORDER — NITROGLYCERIN 0.4 MG SL SUBL
0.4000 mg | SUBLINGUAL_TABLET | Freq: Once | SUBLINGUAL | Status: AC
  Administered 2022-07-23: 0.4 mg via SUBLINGUAL

## 2022-07-23 MED ORDER — REGADENOSON 0.4 MG/5ML IV SOLN
INTRAVENOUS | Status: AC
  Filled 2022-07-23: qty 5

## 2022-07-23 MED ORDER — REGADENOSON 0.4 MG/5ML IV SOLN
0.4000 mg | Freq: Once | INTRAVENOUS | Status: AC
  Administered 2022-07-23: 0.4 mg via INTRAVENOUS

## 2022-07-23 MED ORDER — TECHNETIUM TC 99M TETROFOSMIN IV KIT
31.7000 | PACK | Freq: Once | INTRAVENOUS | Status: AC | PRN
  Administered 2022-07-23: 31.7 via INTRAVENOUS

## 2022-07-23 MED ORDER — TECHNETIUM TC 99M TETROFOSMIN IV KIT
10.7000 | PACK | Freq: Once | INTRAVENOUS | Status: AC | PRN
  Administered 2022-07-23: 10.7 via INTRAVENOUS

## 2022-07-27 ENCOUNTER — Telehealth: Payer: Self-pay | Admitting: Pulmonary Disease

## 2022-07-27 NOTE — Telephone Encounter (Signed)
I called and spoke with the pt  She states her breathing never improved after last visit  She states that she has had negative cards work up, and was told that she needs to go and see pulm again  OV with Katie for 08/01/22 at 9 am  Advised seek emergent care sooner if needed

## 2022-07-30 ENCOUNTER — Ambulatory Visit (HOSPITAL_COMMUNITY)
Admission: RE | Admit: 2022-07-30 | Discharge: 2022-07-30 | Disposition: A | Discharge status: Home / Self Care | Payer: Medicare Other | Source: Ambulatory Visit | Attending: Interventional Radiology | Admitting: Interventional Radiology

## 2022-07-30 DIAGNOSIS — I771 Stricture of artery: Secondary | ICD-10-CM | POA: Diagnosis present

## 2022-07-30 DIAGNOSIS — R5383 Other fatigue: Secondary | ICD-10-CM | POA: Insufficient documentation

## 2022-07-30 DIAGNOSIS — I671 Cerebral aneurysm, nonruptured: Secondary | ICD-10-CM | POA: Diagnosis present

## 2022-08-01 ENCOUNTER — Encounter: Payer: Self-pay | Admitting: Nurse Practitioner

## 2022-08-01 ENCOUNTER — Other Ambulatory Visit: Payer: Self-pay

## 2022-08-01 ENCOUNTER — Other Ambulatory Visit: Payer: Self-pay | Admitting: *Deleted

## 2022-08-01 ENCOUNTER — Telehealth: Payer: Self-pay | Admitting: Nurse Practitioner

## 2022-08-01 ENCOUNTER — Encounter (HOSPITAL_COMMUNITY): Payer: Self-pay

## 2022-08-01 ENCOUNTER — Emergency Department (HOSPITAL_COMMUNITY)
Admission: EM | Admit: 2022-08-01 | Discharge: 2022-08-01 | Discharge status: Against Medical Advice | Payer: Medicare Other | Attending: Emergency Medicine | Admitting: Emergency Medicine

## 2022-08-01 ENCOUNTER — Ambulatory Visit (INDEPENDENT_AMBULATORY_CARE_PROVIDER_SITE_OTHER): Payer: Medicare Other | Admitting: Nurse Practitioner

## 2022-08-01 VITALS — BP 112/72 | HR 73 | Ht 62.0 in | Wt 145.3 lb

## 2022-08-01 DIAGNOSIS — J449 Chronic obstructive pulmonary disease, unspecified: Secondary | ICD-10-CM | POA: Diagnosis not present

## 2022-08-01 DIAGNOSIS — R0602 Shortness of breath: Secondary | ICD-10-CM | POA: Insufficient documentation

## 2022-08-01 DIAGNOSIS — I249 Acute ischemic heart disease, unspecified: Secondary | ICD-10-CM | POA: Diagnosis not present

## 2022-08-01 DIAGNOSIS — R0781 Pleurodynia: Secondary | ICD-10-CM | POA: Insufficient documentation

## 2022-08-01 DIAGNOSIS — Z5321 Procedure and treatment not carried out due to patient leaving prior to being seen by health care provider: Secondary | ICD-10-CM | POA: Diagnosis not present

## 2022-08-01 DIAGNOSIS — I6523 Occlusion and stenosis of bilateral carotid arteries: Secondary | ICD-10-CM

## 2022-08-01 LAB — CBC
HCT: 30.2 % — ABNORMAL LOW (ref 36.0–46.0)
Hemoglobin: 9.8 g/dL — ABNORMAL LOW (ref 12.0–15.0)
MCH: 28.6 pg (ref 26.0–34.0)
MCHC: 32.5 g/dL (ref 30.0–36.0)
MCV: 88 fL (ref 80.0–100.0)
Platelets: 319 10*3/uL (ref 150–400)
RBC: 3.43 MIL/uL — ABNORMAL LOW (ref 3.87–5.11)
RDW: 14 % (ref 11.5–15.5)
WBC: 6.8 10*3/uL (ref 4.0–10.5)
nRBC: 0 % (ref 0.0–0.2)

## 2022-08-01 LAB — BASIC METABOLIC PANEL
Anion gap: 11 (ref 5–15)
BUN: 29 mg/dL — ABNORMAL HIGH (ref 8–23)
CO2: 22 mmol/L (ref 22–32)
Calcium: 9.6 mg/dL (ref 8.9–10.3)
Chloride: 104 mmol/L (ref 98–111)
Creatinine, Ser: 1.8 mg/dL — ABNORMAL HIGH (ref 0.44–1.00)
GFR, Estimated: 28 mL/min — ABNORMAL LOW (ref >60)
Glucose, Bld: 111 mg/dL — ABNORMAL HIGH (ref 70–99)
Potassium: 4.4 mmol/L (ref 3.5–5.1)
Sodium: 137 mmol/L (ref 135–145)

## 2022-08-01 LAB — TROPONIN I (HIGH SENSITIVITY): Troponin I (High Sensitivity): 9 ng/L (ref <18)

## 2022-08-01 MED ORDER — NITROGLYCERIN 0.4 MG SL SUBL: 0.4000 mg | SUBLINGUAL_TABLET | SUBLINGUAL | 0 refills | Status: AC | PRN

## 2022-08-01 MED ORDER — PANTOPRAZOLE SODIUM 40 MG PO TBEC: 40.0000 mg | DELAYED_RELEASE_TABLET | Freq: Two times a day (BID) | ORAL | 1 refills | Status: DC

## 2022-08-01 NOTE — ED Triage Notes (Signed)
Pt bib ems from pulm office. While ambulating pt c.o chest pain but has been ongoing for the past month. Pt seen by cardiology recently and had stress test done. Pt took 1 nitro with relief of pain.

## 2022-08-01 NOTE — Progress Notes (Addendum)
@Patient  ID: [name], female    DOB: 02-23-1941, 81 y.o.   MRN: [id number]  Chief Complaint  Patient presents with   Follow-up    Pt f/u she is having more SOB, cold sweats, abd she looks like she is going to pass out. She is having pain has went to the ED and had neg cardiac workups.     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 She is a patient of Dr. Juline Patch and last seen in office 03/07/2022. Past medical history significant for CAD s/p CABG, carotid artery disease, HTN, diastolic CHF, PVD, GERD, history of GI bleeds, HLD, IDA.   TEST/EVENTS:  08/26/2018 PFT: FVC 65, FEV1 58, ratio 66, TLC 81, DLCO 61. Positive BD 11/19/2021 echocardiogram: EF 60-65%, GIDD, RV nl, LA mildly dilated. Small pericardial effusion.  05/23/2022 CT chest with contrast: extensive atherosclerosis. S/p CABG. Mild emphysema with stable scarring. Stable treated RLL nodule. LLL nodule has resolved. Calcified lesion in the RUL. Lesion in left haptic lobe unchanged, benign. Chronic fx in the right 7th rib with incomplete healing and adjacent pleural thickening.  07/23/2022 NM stress test: no reversible ischemia or infarction. Normal EF  03/07/2022: OV with Dr. Valeta Harms.  Presented for follow-up after CT scan of the chest.  CT showed mild interval increase in the right lower lobe lesion that she had radiation to before.  There was also a subsolid lesion in the left lower lobe that is new.  From a respiratory standpoint, she is doing well on Trelegy.  Recently had surgery on right hand for carpal tunnel without any postoperative complications.  Plan for repeat CT in 3 months.  Will discuss with Dr. Earlie Server to determine if repeat biopsy is necessary depending on follow-up scan.  08/01/2022: Today-acute Patient presents today for acute visit.  She is with her daughter, who is very frustrated because she  has been having trouble with chest pain, nausea and near syncope for around a month now.  She has had multiple episodes that occur only after exertion.  She had contacted EMS towards the end of November or beginning of December after being in [employer] with onset of chest pain to the left side and in between her shoulder blades followed by feeling very flushed and nauseous.  She was told by the EMTs that she was getting enough oxygen to her heart so they placed her on oxygen and gave her nitroglycerin.  Her blood pressure was also noted to be significantly elevated with systolics in the 253G.  She had improvement in her symptoms after this so did not end up going to the hospital.  She then went to the ED on 12/4 after a similar event happened.  She again had chest pain followed by lightheadedness, near syncope and nausea without vomiting.  During this stay, she underwent nuclear stress test.  Her daughter tells me that during the test she was told that she was having a heart attack and again was treated with nitroglycerin with improvement in symptoms.  No notes to review in her chart from cardiology during this time.  They tell me that the cardiologist at the hospital was Dr. Doylene Canard. She was discharged home with instructions to limit exertion and advised to follow up with her cardiologist outpatient, Dr. Terrence Dupont. She saw him around a week ago and was told that her stress test was normal. No  further interventions were made, per her report. They tell me that they have been told it's not her heart and it's not her stomach.  Regarding her breathing, she does get a little more short winded when these events occur but otherwise, her breathing feels stable. She has not had any drops in her oxygen levels; always >90%. She is worried that her cancer has come back. Scan from October showed stable radiated changes to the RLL and resolution of LLL nodule. She has no significant cough, chest congestion or wheezing. She denies  hemoptysis or weight loss, changes in bowel habits, bloody stools, difficulties swallowing. She is using her Trelegy 3-4 times a week. She has had pneumonia before when she was using it daily and they thought her Trelegy was the reason why. Rarely using rescue inhaler. Symptoms improve with nitroglycerin; no change with bronchodilators.    Allergies  Allergen Reactions   Avelox [Moxifloxacin Hcl In Nacl] Palpitations and Other (See Comments)    Caused Heart Attack    Azithromycin Swelling and Other (See Comments)    Patient reported past history of lip swelling   Codeine Other (See Comments)    Dr. Terrence Dupont advised patient not to take this medication   Doxycycline Swelling and Other (See Comments)    Mouth, lips, feet swelling   Hydromorphone Palpitations and Other (See Comments)    DILAUDID  -  Pt had a Heart Attack after taking Dilaudid.   Levaquin [Levofloxacin] Shortness Of Breath and Other (See Comments)    Chest pressure, SOB, "pain in between shoulder blades", sweaty -as reported by patient per experience in ED this afternoon   Vitamin D Analogs Swelling and Other (See Comments)    Face and lips swell, but no breathing issues   Nifedipine Er Other (See Comments)    Dropped the heart rate too low   Octreotide     Chest pain, N/V   Oxycodone-Acetaminophen Other (See Comments)    Says it makes her "feel weird"   Risedronate Other (See Comments)    Chest pain    Immunization History  Administered Date(s) Administered   Fluad Quad(high Dose 65+) 04/24/2018, 04/07/2019, 05/25/2020, 04/27/2021, 04/25/2022   Influenza Split 05/15/2012   Influenza Whole 05/13/2012   Influenza, High Dose Seasonal PF 05/22/2013, 07/13/2014, 04/24/2018, 05/13/2018, 04/07/2019   Influenza,inj,Quad PF,6+ Mos 05/15/2012, 05/15/2012, 04/14/2015, 05/18/2015, 05/18/2015, 04/10/2016, 04/10/2016, 04/30/2017, 04/30/2017, 04/24/2018   Influenza-Unspecified 05/15/2012, 05/22/2013, 07/13/2014, 05/18/2015,  04/10/2016   PFIZER Comirnaty(Gray Top)Covid-19 Tri-Sucrose Vaccine 03/29/2021   PFIZER(Purple Top)SARS-COV-2 Vaccination 09/28/2019, 10/20/2019, 06/17/2020   Pfizer Covid-19 Vaccine Bivalent Booster 69yrs & up 07/26/2021   Pneumococcal Conjugate-13 05/15/2012   Pneumococcal Polysaccharide-23 11/30/2014    Past Medical History:  Diagnosis Date   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   Asthma    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   Blood in stool    last week of aug 2018   CAP (community acquired pneumonia) 10/22/2015   Chronic kidney disease    COPD (chronic obstructive pulmonary disease) ([place])    Coronary artery disease    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   Diverticulitis    Dyspnea    with exertion   Gastroesophageal reflux disease    occ   Glaucoma    right eye   Headache syndrome 11/27/2018   Hiatal hernia    History of shingles 06/23/2018   Hyperlipidemia    Hypertension    Lung cancer (  [place]) dx 2018   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   Myocardial infarction ([place]) 01/01/2000   Cardiac catheterization   Peripheral vascular disease ([place])    stents in legs x 2 or 3   Pneumonia    last time winter 2017 -2018   PONV (postoperative nausea and vomiting)    occassionally, last colonscopy did ok with anesthesia   Primary cancer of right lower lobe of lung ([place]) 04/25/2016   Right-sided carotid artery disease ([place]) 01/07/2013   TIA (transient ischemic attack) 02/11/2019   Wears dentures    Full set   Wears glasses     Tobacco History: Social History   Tobacco Use  Smoking Status Former   Packs/day: 1.50   Years: 30.00   Total pack years: 45.00   Types: Cigarettes   Quit date: 08/13/2000   Years since quitting: 21.9  Smokeless Tobacco Never   Counseling given: Not Answered   Outpatient Medications Prior to Visit  Medication Sig Dispense Refill    acetaminophen (TYLENOL) 500 MG tablet Take 500-1,000 mg by mouth every 6 (six) hours as needed (for pain).     albuterol (VENTOLIN HFA) 108 (90 Base) MCG/ACT inhaler Inhale 2 puffs into the lungs every 6 (six) hours as needed for wheezing or shortness of breath. 8 g 6   amoxicillin-clavulanate (AUGMENTIN) 500-125 MG tablet Take 1 tablet (500 mg total) by mouth 2 (two) times daily. 14 tablet 0   atorvastatin (LIPITOR) 40 MG tablet Take 1 tablet (40 mg total) by mouth at bedtime. 30 tablet 6   cetirizine (ZYRTEC) 10 MG tablet Take 10 mg by mouth daily as needed for allergies.      clopidogrel (PLAVIX) 75 MG tablet Take 1 tablet (75 mg total) by mouth daily. (Patient taking differently: Take 37.5 mg by mouth in the morning.) 90 tablet 3   fluorouracil (EFUDEX) 5 % cream Apply topically.     Fluticasone-Umeclidin-Vilant (TRELEGY ELLIPTA) 200-62.5-25 MCG/ACT AEPB Inhale 1 puff into the lungs daily. 180 each 3   furosemide (LASIX) 40 MG tablet Take 40 mg by mouth in the morning.     Magnesium Oxide 400 MG CAPS Take 400 mg by mouth in the morning.     olmesartan (BENICAR) 20 MG tablet Take 1 tablet (20 mg total) by mouth daily. (Patient taking differently: Take 10 mg by mouth in the morning.) 90 tablet 1   ondansetron (ZOFRAN-ODT) 4 MG disintegrating tablet Take 4 mg by mouth every 8 (eight) hours as needed for nausea or vomiting (dissolve in the mouth).     dexlansoprazole (DEXILANT) 60 MG capsule Take 1 capsule (60 mg total) by mouth daily before breakfast. 30 capsule 11   nitroGLYCERIN (NITROSTAT) 0.4 MG SL tablet Place 0.4 mg under the tongue every 5 (five) minutes x 3 doses as needed for chest pain.     No facility-administered medications prior to visit.     Review of Systems:   Constitutional: No weight loss or gain, night sweats, fevers, chills, fatigue. +lassitude, cold sweats. HEENT: No headaches, difficulty swallowing, tooth/dental problems, or sore throat. No sneezing, itching, ear ache,  nasal congestion, or post nasal drip CV:  +dizziness/lightheadedness, near syncope, chest pain. No PND, swelling in lower extremities, anasarca, palpitations, syncope Resp: +shortness of breath with exertion. No excess mucus or change in color of mucus. No productive or non-productive. No hemoptysis. No wheezing.  No chest wall deformity GI:  +nausea. No heartburn, indigestion, abdominal pain, vomiting, diarrhea, change  in bowel habits, loss of appetite, bloody stools.  GU: No dysuria, change in color of urine, urgency or frequency.  No flank pain, no hematuria  Skin: No rash, lesions, ulcerations MSK:  No joint pain or swelling.   Neuro: No gait abnormalities. No confusion.  Psych: No depression or anxiety. Mood stable.     Physical Exam:  BP 112/72   Pulse 73   Ht 5\' 2"  (1.575 m)   Wt 145 lb 4.8 oz (65.9 kg)   SpO2 96%   BMI 26.58 kg/m   GEN: Pleasant, interactive, well-kempt; in no acute distress during initial evaluation. After walking oximetry, she developed chest pain radiating to her shoulder blades, became diaphoretic and pale.  HEENT:  Normocephalic and atraumatic. PERRLA. Sclera white. Nasal turbinates pink, moist and patent bilaterally. No rhinorrhea present. Oropharynx pink and moist, without exudate or edema. No lesions, ulcerations, or postnasal drip.  NECK:  Supple w/ fair ROM. No JVD present. Normal carotid impulses w/o bruits. Thyroid symmetrical with no goiter or nodules palpated. No lymphadenopathy.   CV: RRR, no m/r/g, no peripheral edema. Pulses intact, +2 bilaterally. No cyanosis, pallor or clubbing. PULMONARY:  Unlabored, regular breathing. Minimal end expiratory wheeze bilaterally; clears with deep inspiration. No accessory muscle use.  GI: BS present and normoactive. Soft, non-tender to palpation. No organomegaly or masses detected.  MSK: No erythema, warmth or tenderness. Cap refil <2 sec all extrem. No deformities or joint swelling noted.  Neuro: A/Ox3. No  focal deficits noted.   Skin: Warm, no lesions or rashe Psych: Normal affect and behavior. Judgement and thought content appropriate.     Lab Results:  CBC    Component Value Date/Time   WBC 6.8 08/01/2022 1121   RBC 3.43 (L) 08/01/2022 1121   HGB 9.8 (L) 08/01/2022 1121   HGB 10.7 (L) 05/30/2022 0943   HGB 9.9 (L) 08/02/2017 0821   HCT 30.2 (L) 08/01/2022 1121   HCT 29.8 (L) 08/02/2017 0821   PLT 319 08/01/2022 1121   PLT 298 05/30/2022 0943   PLT 218 08/02/2017 0821   MCV 88.0 08/01/2022 1121   MCV 92.3 08/02/2017 0821   MCH 28.6 08/01/2022 1121   MCHC 32.5 08/01/2022 1121   RDW 14.0 08/01/2022 1121   RDW 14.4 08/02/2017 0821   LYMPHSABS 4.1 (H) 07/16/2022 1212   LYMPHSABS 2.6 08/02/2017 0821   MONOABS 0.6 07/16/2022 1212   MONOABS 0.5 08/02/2017 0821   EOSABS 0.4 07/16/2022 1212   EOSABS 0.2 08/02/2017 0821   BASOSABS 0.1 07/16/2022 1212   BASOSABS 0.0 08/02/2017 0821    BMET    Component Value Date/Time   NA 137 08/01/2022 1121   NA 139 08/02/2017 0821   K 4.4 08/01/2022 1121   K 4.3 08/02/2017 0821   CL 104 08/01/2022 1121   CO2 22 08/01/2022 1121   CO2 24 08/02/2017 0821   GLUCOSE 111 (H) 08/01/2022 1121   GLUCOSE 100 08/02/2017 0821   BUN 29 (H) 08/01/2022 1121   BUN 14.4 08/02/2017 0821   CREATININE 1.80 (H) 08/01/2022 1121   CREATININE 1.69 (H) 05/30/2022 0943   CREATININE 1.3 (H) 08/02/2017 0821   CALCIUM 9.6 08/01/2022 1121   CALCIUM 9.2 08/02/2017 0821   GFRNONAA 28 (L) 08/01/2022 1121   GFRNONAA 31 (L) 05/30/2022 0943   GFRAA 34 (L) 02/26/2020 0842    BNP    Component Value Date/Time   BNP 45.3 07/16/2022 1212     Imaging:  MR ANGIO HEAD WO CONTRAST  Result Date: 07/31/2022 CLINICAL DATA:  Follow-up aneurysm EXAM: MRA HEAD WITHOUT CONTRAST TECHNIQUE: Angiographic images of the Circle of Willis were acquired using MRA technique without intravenous contrast. COMPARISON:  11/19/2021.  06/30/2021.  04/01/2019. FINDINGS: Anterior  circulation: Both internal carotid arteries widely patent through the skull base and siphon regions. The anterior and middle cerebral vessels are patent. 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 No other anterior circulation aneurysm. Posterior circulation: Both vertebral arteries widely patent to the basilar. No basilar stenosis. Posterior circulation branch vessels are normal. Anatomic variants: None significant. Other: None. 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 No change small for prior exams. Otherwise negative study. Electronically Signed   By: Nelson Chimes M.D.   On: 07/31/2022 14:32   NM Myocar Multi W/Spect Tamela Oddi Motion / EF  Result Date: 07/23/2022 CLINICAL DATA:  Chest pain. EXAM: MYOCARDIAL IMAGING WITH SPECT (REST AND PHARMACOLOGIC-STRESS) GATED LEFT VENTRICULAR WALL MOTION STUDY LEFT VENTRICULAR EJECTION FRACTION TECHNIQUE: Standard myocardial SPECT imaging was performed after resting intravenous injection of 10.7 mCi Tc-10m tetrofosmin. Subsequently, intravenous infusion of Lexiscan was performed under the supervision of the Cardiology staff. At peak effect of the drug, 31.7 mCi Tc-40m tetrofosmin was injected intravenously and standard myocardial SPECT imaging was performed. Quantitative gated imaging was also performed to evaluate left ventricular wall motion, and estimate left ventricular ejection fraction. COMPARISON:  None Available. FINDINGS: Perfusion: No decreased activity in the left ventricle on stress imaging to suggest reversible ischemia or infarction. Wall Motion: Normal left ventricular wall motion. No left ventricular dilation. Left Ventricular Ejection Fraction: 68 % End diastolic volume 76 ml End systolic volume 25 ml IMPRESSION: 1. No reversible ischemia or infarction. 2. Normal left ventricular wall motion. 3. Left ventricular ejection  fraction 68% 4. Non invasive risk stratification*: Low *2012 Appropriate Use Criteria for Coronary Revascularization Focused Update: J Am Coll Cardiol. 1497;02(6):378-588. http://content.airportbarriers.com.aspx?articleid=[email or web address] Electronically Signed   By: Marlaine Hind M.D.   On: 07/23/2022 13:54   DG Chest 2 View  Result Date: 07/16/2022 CLINICAL DATA:  Chest pain, lung cancer EXAM: CHEST - 2 VIEW COMPARISON:  10/28/2021 chest radiograph. FINDINGS: Stable discontinuity in the lower most sternotomy wires, which are otherwise intact. Stable cardiomediastinal silhouette with normal heart size. No pneumothorax. No pleural effusion. Stable nodular right infrahilar focus of consolidation. No pulmonary edema. No acute consolidative airspace disease. Cholecystectomy clips are seen in the right upper quadrant of the abdomen. IMPRESSION: 1. No acute cardiopulmonary disease. 2. Stable nodular right infrahilar focus of consolidation, most compatible with stable post treatment change. Electronically Signed   By: Ilona Sorrel M.D.   On: 07/16/2022 12:35         Latest Ref Rng & Units 08/26/2018   12:59 PM 02/14/2018   11:18 AM 04/10/2016    9:11 AM  PFT Results  FVC-Pre L 1.69  1.80  P 1.53   FVC-Predicted Pre % 65  69  P 57   FVC-Post L 1.91  1.99  P 1.82   FVC-Predicted Post % 74  77  P 69   Pre FEV1/FVC % % 67  72  P 71   Post FEV1/FCV % % 66  72  P 70   FEV1-Pre L 1.12  1.30  P 1.09   FEV1-Predicted Pre % 58  67  P 54   FEV1-Post L 1.26  1.43  P 1.28  DLCO uncorrected ml/min/mmHg 11.93   11.07   DLCO UNC% % 55   51   DLCO corrected ml/min/mmHg 13.35     DLCO COR %Predicted % 61     DLVA Predicted % 97   86   TLC L 3.85   3.77   TLC % Predicted % 81   79   RV % Predicted % 94   96     P Preliminary result    No results found for: "NITRICOXIDE"      Assessment & Plan:   ACS (acute coronary syndrome) (HCC) Underlying cause unclear. Constellation of symptoms and history seems most  consistent with cardiac etiology. She did have NM stress test with no evidence of reversible ischemia and normal EF. Discussed further pulmonary/cardiac workup and optimizing treatment for GERD, as we reviewed this is a common cause of chest discomfort and nausea.   Unfortunately, after our initial HPI review and physical exam, she underwent walking oximetry to ensure hypoxia was not a contributing factor to her symptoms. No desaturations occurred; maintained >95% on room air. She suddenly developed severe left sided chest discomfort that radiated to her shoulder blades, became diaphoretic and pale, and nauseated. She felt slightly more dyspneic after becoming nauseated but no issus with visible DOE during her walk. EKG obtained during episode showed some ST changes in aVR, which appear chronic, and possible depression in V4, V5, and V6, new. VS were stable; placed on supplemental O2. She self administered nitroglycerin with improvement/resolution of chest pain. EMS was contacted for transportation to [facility] ED given concern for ACS and risk for decompensation. Carlis Abbott, ED RN, aware of impending admission. Dr. Valeta Harms unavailable; Dr. Lamonte Sakai in office and agreeable with evaluation/plan.    I spent 60 minutes of dedicated to the care of this patient on the date of this encounter to include pre-visit review of records, face-to-face time with the patient discussing conditions above, post visit ordering of testing, clinical documentation with the electronic health record, making appropriate referrals as documented, and communicating necessary findings to members of the patients care team.  Clayton Bibles, NP 08/01/2022  Pt aware and understands NP's role.

## 2022-08-01 NOTE — ED Notes (Signed)
Pt advised she was leaving. IV removed.

## 2022-08-01 NOTE — Telephone Encounter (Signed)
Strongly encourage her to see cardiology if she left the hospital.  Her symptoms/response to nitroglycerin is most consistent with a heart condition.  I'm not convinced this is related to reflux but it is possible so we can see if she has any improvement with changing therapies. I will send in protonix 40 mg Twice daily for her to try instead of her dexilant. She should do this for 6 weeks to see if she experiences any improvement in her chest discomfort/nausea. I also refilled her nitroglycerin since her previous prescription was expired. Thanks.

## 2022-08-01 NOTE — ED Provider Triage Note (Signed)
Emergency Medicine Provider Triage Evaluation Note  [name] , a 81 y.o. female  was evaluated in triage.  Pt complains of chest pain.  Patient was at the pulm office today and was walking around the department and was noted to be short of breath and endorsed chest pain at that time.  Chest pain is located around the bases of her rib cage and extends to her back.  At this time patient states that she is not short of breath or having chest pain.  Chest pain and shortness of breath are worse with exertion.  Patient not endorsing any lower extremity edema and states she has not gained any additional weight that she knows.  Review of Systems  Positive: See above Negative: See above  Physical Exam  BP (!) 116/50 (BP Location: Right Arm)   Pulse 69   Temp (!) 97.5 F (36.4 C)   Resp 15   Ht 5\' 2"  (1.575 m)   Wt 65.9 kg   SpO2 95%   BMI 26.57 kg/m  Gen:   Awake, no distress   Resp:  Normal effort  MSK:   Moves extremities without difficulty  Other:    Medical Decision Making  Medically screening exam initiated at 11:59 AM.  Appropriate orders placed.  SIGNA CHEEK was informed that the remainder of the evaluation will be completed by another provider, this initial triage assessment does not replace that evaluation, and the importance of remaining in the ED until their evaluation is complete.  Work up started   Harriet Pho, PA-C 08/01/22 1204

## 2022-08-01 NOTE — Telephone Encounter (Signed)
ATC patient with Katie,NP recommendations.  LM to call back.   I did see in her chart that she is currently in [facility] ED.

## 2022-08-01 NOTE — Assessment & Plan Note (Addendum)
Underlying cause unclear. Constellation of symptoms and history seems most consistent with cardiac etiology. She did have NM stress test with no evidence of reversible ischemia and normal EF. Discussed further pulmonary/cardiac workup and optimizing treatment for GERD, as we reviewed this is a common cause of chest discomfort and nausea.   Unfortunately, after our initial HPI review and physical exam, she underwent walking oximetry to ensure hypoxia was not a contributing factor to her symptoms. No desaturations occurred; maintained >95% on room air. She suddenly developed severe left sided chest discomfort that radiated to her shoulder blades, became diaphoretic and pale, and nauseated. She felt slightly more dyspneic after becoming nauseated but no issus with visible DOE during her walk. EKG obtained during episode showed some ST changes in aVR, which appear chronic, and possible depression in V4, V5, and V6, new. VS were stable; placed on supplemental O2. She self administered nitroglycerin with improvement/resolution of chest pain. EMS was contacted for transportation to [facility] ED given concern for ACS and risk for decompensation. Carlis Abbott, ED RN, aware of impending admission. Dr. Valeta Harms unavailable; Dr. Lamonte Sakai in office and agreeable with evaluation/plan.

## 2022-08-01 NOTE — Telephone Encounter (Signed)
Please call regarding medications. Please call to advise [phone]  She ([name])  was supposed to call some meds in depending on if she was home or in hospital. She is home now. TY team.

## 2022-08-02 ENCOUNTER — Telehealth (HOSPITAL_COMMUNITY): Payer: Self-pay

## 2022-08-02 ENCOUNTER — Telehealth: Payer: Self-pay

## 2022-08-02 NOTE — Telephone Encounter (Signed)
Called Pt to answer her sickie. [name] was sitting beside me and told me to tell her that she called in her Protonix and nitroglycerin. Joellen Jersey also told me to tell her that she is not changing her Trelegy right now and try using it more often until she sees Dr Valeta Harms 08/22/2022. Pt stated understanding and nothing further needed.

## 2022-08-02 NOTE — Telephone Encounter (Signed)
Pt agreed to f/u in 2 years with an mra. AW  

## 2022-08-02 NOTE — Telephone Encounter (Signed)
PT wonders if Ms. Cobb can pls call in her script. It was for Protonix or Probonic (instead of Dexalin) , nitro's and inhaler. (Changing Trelogy for another inhaler, she states.) Sorry for spelling.PT may not remember all she was to call in so please rev chart.   She also states she has a appt to see Dr. Doren Custard (Vascular Surgeon) on the 28th of this month. She wanted Katie to know that.  [facility] [address] call at [phone]

## 2022-08-02 NOTE — Telephone Encounter (Signed)
Called and spoke with pt letting her know the recs per Katie. Pt said prior to leaving the ED, she did call her cardiologist and they told her that they did not think that her symptoms were related to her heart due to all of her tests looking good. Pt stated that cardiology told her to call Dr. Doren Custard, vascular surgeon, which she did and has scheduled an upcoming appt with them 12/28.  Made pt aware that Rx for both protonix and nitroglycerin were sent to the pharmacy for her.  Pt wanted to know if the Trelegy inhaler was going to be switched to a different inhaler as she stated that her and Joellen Jersey had discussed this.  Katie, please advise.

## 2022-08-09 ENCOUNTER — Ambulatory Visit (HOSPITAL_COMMUNITY)
Admission: RE | Admit: 2022-08-09 | Discharge: 2022-08-09 | Disposition: A | Discharge status: Home / Self Care | Payer: Medicare Other | Source: Ambulatory Visit | Attending: Vascular Surgery | Admitting: Vascular Surgery

## 2022-08-09 ENCOUNTER — Ambulatory Visit (INDEPENDENT_AMBULATORY_CARE_PROVIDER_SITE_OTHER): Payer: Medicare Other | Admitting: Physician Assistant

## 2022-08-09 VITALS — BP 158/78 | HR 68 | Temp 98.3°F | Resp 20 | Ht 62.0 in | Wt 145.3 lb

## 2022-08-09 DIAGNOSIS — Z9889 Other specified postprocedural states: Secondary | ICD-10-CM

## 2022-08-09 DIAGNOSIS — I6523 Occlusion and stenosis of bilateral carotid arteries: Secondary | ICD-10-CM

## 2022-08-09 NOTE — Progress Notes (Signed)
Office Note     CC:  follow up Requesting Provider:  [name], MD  HPI: [name] is a 81 y.o. (May 15, 1941) female who presents for follow up of carotid artery disease. s/p right CEA on 09/26/2021 by Dr. Scot Dock for asymptomatic right carotid artery stenosis.  She has remote hx of left CEA 03/28/2010 also by Dr. Scot Dock.   Pt returns today for follow up. She presents with her daughter today. She says she has been doing so so. She has been in and out of hospital with heart related issues. She has received mixed information regarding having recent heart attack. She says she had stress test, which she was told was okay. However she keeps having episodes when she is doing any activities where she has chest pain, gets clammy,and lightheaded. She also says she has been somewhat short of breath with exertion and wheezing. She denies any visual changes, speech difficulties or unilateral weakness or numbness of her arms or legs.  She is not having any difficulty swallowing. She denies any claudication like symptoms, no rest pain or tissue loss. She does report some left hip pain on prolonged ambulation. She says it feels deep in her left hip. Resolves with rest.    The pt is on a statin for cholesterol management.  The pt is  not on a daily aspirin.   Other AC:  Plavix The pt is not on medication for hypertension.   The pt is not diabetic.  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   Asthma    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   Blood in stool    last week of aug 2018   CAP (community acquired pneumonia) 10/22/2015   Chronic kidney disease    COPD (chronic obstructive pulmonary disease) ([facility])    Coronary artery disease    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   Diverticulitis    Dyspnea    with exertion   Gastroesophageal reflux disease     occ   Glaucoma    right eye   Headache syndrome 11/27/2018   Hiatal hernia    History of shingles 06/23/2018   Hyperlipidemia    Hypertension    Lung cancer ([place]) dx 2018   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   Myocardial infarction ([place]) 01/01/2000   Cardiac catheterization   Peripheral vascular disease ([place])    stents in legs x 2 or 3   Pneumonia    last time winter 2017 -2018   PONV (postoperative nausea and vomiting)    occassionally, last colonscopy did ok with anesthesia   Primary cancer of right lower lobe of lung ([place]) 04/25/2016   Right-sided carotid artery disease ([place]) 01/07/2013   TIA (transient ischemic attack) 02/11/2019   Wears dentures    Full set   Wears glasses     Past Surgical History:  Procedure Laterality Date   ABDOMINAL HYSTERECTOMY     APPENDECTOMY     Arnold-chiari malformation repair  1998   Suboccipital craniectomy   CAROTID ENDARTERECTOMY  03/29/2010   Left  CEA   CHOLECYSTECTOMY     Gall Bladder   COLONOSCOPY WITH PROPOFOL N/A 04/22/2015   Procedure: COLONOSCOPY WITH PROPOFOL;  Surgeon: Carol Ada, MD;  Location: [facility];  Service: Endoscopy;  Laterality: N/A;   COLONOSCOPY WITH PROPOFOL N/A 05/25/2016  Procedure: COLONOSCOPY WITH PROPOFOL;  Surgeon: Carol Ada, MD;  [facility];  Service: Endoscopy;  Laterality: N/A;   COLONOSCOPY WITH PROPOFOL N/A 05/03/2017   Procedure: COLONOSCOPY WITH PROPOFOL;  Surgeon: Carol Ada, MD;  [facility];  Service: Endoscopy;  Laterality: N/A;   COLONOSCOPY WITH PROPOFOL N/A 06/29/2020   Procedure: COLONOSCOPY WITH PROPOFOL;  Surgeon: Carol Ada, MD;  [facility];  Service: Endoscopy;  Laterality: N/A;   COLONOSCOPY WITH PROPOFOL N/A 01/05/2021   Procedure: COLONOSCOPY WITH PROPOFOL;  Surgeon: Carol Ada, MD;  [facility];  Service: Endoscopy;  Laterality: N/A;   COLONOSCOPY WITH PROPOFOL N/A 09/28/2021   Procedure: COLONOSCOPY WITH  PROPOFOL;  Surgeon: Carol Ada, MD;  Location: [facility];  Service: Endoscopy;  Laterality: N/A;   CORNEAL TRANSPLANT     Right   CORONARY ARTERY BYPASS GRAFT  01/01/2000   x 3   ENDARTERECTOMY Right 09/26/2021   Procedure: RIGHT CAROTID ENDARTERECTOMY;  Surgeon: [name], MD;  Location: [facility];  Service: Vascular;  Laterality: Right;   ENTEROSCOPY N/A 02/27/2018   Procedure: ENTEROSCOPY;  Surgeon: Carol Ada, MD;  [facility];  Service: Endoscopy;  Laterality: N/A;   ENTEROSCOPY N/A 07/18/2018   Procedure: ENTEROSCOPY;  Surgeon: Carol Ada, MD;  [facility];  Service: Endoscopy;  Laterality: N/A;   ENTEROSCOPY N/A 06/29/2020   Procedure: ENTEROSCOPY;  Surgeon: Carol Ada, MD;  [facility];  Service: Endoscopy;  Laterality: N/A;   ENTEROSCOPY N/A 12/05/2021   Procedure: ENTEROSCOPY;  Surgeon: Carol Ada, MD;  [facility];  Service: Gastroenterology;  Laterality: N/A;   ENTEROSCOPY N/A 12/25/2021   Procedure: ENTEROSCOPY;  Surgeon: Carol Ada, MD;  [facility];  Service: Gastroenterology;  Laterality: N/A;  with endoscopic placement of video capsule   ESOPHAGOGASTRODUODENOSCOPY N/A 05/25/2016   Procedure: ESOPHAGOGASTRODUODENOSCOPY (EGD);  Surgeon: Carol Ada, MD;  Location[facility];  Service: Endoscopy;  Laterality: N/A;   ESOPHAGOGASTRODUODENOSCOPY N/A 08/01/2018   Procedure: ESOPHAGOGASTRODUODENOSCOPY (EGD);  Surgeon: Carol Ada, MD;  Location[facility];  Service: Endoscopy;  Laterality: N/A;   ESOPHAGOGASTRODUODENOSCOPY (EGD) WITH PROPOFOL N/A 09/28/2021   Procedure: ESOPHAGOGASTRODUODENOSCOPY (EGD) WITH PROPOFOL;  Surgeon: Carol Ada, MD;  Location: [facility];  Service: Endoscopy;  Laterality: N/A;   EYE SURGERY Right 1995 or 1996   Laser surgery for retinal hemorrhage   GIVENS CAPSULE STUDY N/A 07/16/2018   Procedure: GIVENS CAPSULE STUDY;  Surgeon: Carol Ada, MD;  [facility];  Service: Endoscopy;  Laterality: N/A;   GIVENS CAPSULE STUDY N/A 12/14/2021   Procedure: GIVENS CAPSULE STUDY;  Surgeon: Carol Ada, MD;  Location: [facility];  Service: Gastroenterology;  Laterality: N/A;   GIVENS CAPSULE STUDY N/A 12/25/2021   Procedure: GIVENS CAPSULE STUDY;  Surgeon: Carol Ada, MD;  [facility];  Service: Gastroenterology;  Laterality: N/A;   HEMOSTASIS CLIP PLACEMENT  06/29/2020   Procedure: HEMOSTASIS CLIP PLACEMENT;  Surgeon: Carol Ada, MD;  [facility];  Service: Endoscopy;;   HEMOSTASIS CLIP PLACEMENT  01/05/2021   Procedure: HEMOSTASIS CLIP PLACEMENT;  Surgeon: Carol Ada, MD;  [facility];  Service: Endoscopy;;   HOT HEMOSTASIS N/A 02/27/2018   Procedure: HOT HEMOSTASIS (ARGON PLASMA COAGULATION/BICAP);  Surgeon: Carol Ada, MD;  Location[facility];  Service: Endoscopy;  Laterality: N/A;   HOT HEMOSTASIS N/A 08/01/2018   Procedure: HOT HEMOSTASIS (ARGON PLASMA COAGULATION/BICAP);  Surgeon: Carol Ada, MD;  Location[facility];  Service: Endoscopy;  Laterality: N/A;   HOT HEMOSTASIS N/A  06/29/2020   Procedure: HOT HEMOSTASIS (ARGON PLASMA COAGULATION/BICAP);  Surgeon: Carol Ada, MD;  Location[facility];  Service: Endoscopy;  Laterality: N/A;   HOT HEMOSTASIS N/A 01/05/2021   Procedure: HOT HEMOSTASIS (ARGON PLASMA COAGULATION/BICAP);  Surgeon: Carol Ada, MD;  Location[facility];  Service: Endoscopy;  Laterality: N/A;   HOT HEMOSTASIS N/A 12/05/2021   Procedure: HOT HEMOSTASIS (ARGON PLASMA COAGULATION/BICAP);  Surgeon: Carol Ada, MD;  Location[facility];  Service: Gastroenterology;  Laterality: N/A;   IR RADIOLOGIST EVAL & MGMT  12/14/2016   IR RADIOLOGIST EVAL & MGMT  07/26/2021   LEFT HEART CATH AND CORS/GRAFTS ANGIOGRAPHY N/A 01/09/2018   Procedure: LEFT HEART CATH AND CORS/GRAFTS ANGIOGRAPHY;  Surgeon: Charolette Forward, MD;  Location: [facility];  Service: Cardiovascular;   Laterality: N/A;   LEFT HEART CATHETERIZATION WITH CORONARY ANGIOGRAM N/A 08/03/2014   Procedure: LEFT HEART CATHETERIZATION WITH CORONARY ANGIOGRAM;  Surgeon: Birdie Riddle, MD;  Location: [facility];  Service: Cardiovascular;  Laterality: N/A;   PATCH ANGIOPLASTY Right 09/26/2021   Procedure: PATCH ANGIOPLASTY RIGHT CAROTID;  Surgeon: [name], MD;  Location: [facility];  Service: Vascular;  Laterality: Right;   Post Coronary Artery  BPG  01/05/2000   Right jugular sheath removed   PR VEIN BYPASS GRAFT,AORTO-FEM-POP     ROTATOR CUFF REPAIR     Right    Social History   Socioeconomic History   Marital status: Widowed    Spouse name: Not on file   Number of children: 4   Years of education: 7TH   Highest education level: Not on file  Occupational History   Not on file  Tobacco Use   Smoking status: Former    Packs/day: 1.50    Years: 30.00    Total pack years: 45.00    Types: Cigarettes    Quit date: 08/13/2000    Years since quitting: 22.0    Passive exposure: Never   Smokeless tobacco: Never  Vaping Use   Vaping Use: Never used  Substance and Sexual Activity   Alcohol use: No    Alcohol/week: 0.0 standard drinks of alcohol   Drug use: No   Sexual activity: Never  Other Topics Concern   Not on file  Social History Narrative   Lives at home w/ her son   Right-handed   Drinks coffee and [employer]eps[employer] daily   Social Determinants of Health   Financial Resource Strain: Not on file  Food Insecurity: Not on file  Transportation Needs: Not on file  Physical Activity: Not on file  Stress: Not on file  Social Connections: Not on file  Intimate Partner Violence: Not on file    Family History  Problem Relation Age of Onset   Heart disease Mother        Heart Disease before age 6   Hypertension Mother    Hyperlipidemia Mother    Heart attack Mother    Clotting disorder Mother    Heart disease Father        Heart Disease before age 34   Heart attack Father     Hyperlipidemia Father    Hypertension Father    Heart disease Brother        Heart Disease before age 46   Hyperlipidemia Brother    Hypertension Brother    Clotting disorder Brother    AAA (abdominal aortic aneurysm) Brother    Cerebral aneurysm Sister    Hypertension Sister    AAA (abdominal aortic aneurysm)  Sister    Asthma Sister    Cerebral aneurysm Brother    Cancer Brother        Lung   Hypertension Brother    Heart attack Brother    Heart disease Brother        Aneurysm of Brain   Hypertension Brother    Heart disease Brother    Heart disease Brother    Stroke Son        Aneurysm of Stomach   AAA (abdominal aortic aneurysm) Son    Cancer Maternal Uncle        great uncle/cancer/type unknown    Current Outpatient Medications  Medication Sig Dispense Refill   acetaminophen (TYLENOL) 500 MG tablet Take 500-1,000 mg by mouth every 6 (six) hours as needed (for pain).     albuterol (VENTOLIN HFA) 108 (90 Base) MCG/ACT inhaler Inhale 2 puffs into the lungs every 6 (six) hours as needed for wheezing or shortness of breath. 8 g 6   amoxicillin-clavulanate (AUGMENTIN) 500-125 MG tablet Take 1 tablet (500 mg total) by mouth 2 (two) times daily. 14 tablet 0   atorvastatin (LIPITOR) 40 MG tablet Take 1 tablet (40 mg total) by mouth at bedtime. 30 tablet 6   cetirizine (ZYRTEC) 10 MG tablet Take 10 mg by mouth daily as needed for allergies.      clopidogrel (PLAVIX) 75 MG tablet Take 1 tablet (75 mg total) by mouth daily. (Patient taking differently: Take 37.5 mg by mouth in the morning.) 90 tablet 3   fluorouracil (EFUDEX) 5 % cream Apply topically.     Fluticasone-Umeclidin-Vilant (TRELEGY ELLIPTA) 200-62.5-25 MCG/ACT AEPB Inhale 1 puff into the lungs daily. 180 each 3   furosemide (LASIX) 40 MG tablet Take 40 mg by mouth in the morning.     Magnesium Oxide 400 MG CAPS Take 400 mg by mouth in the morning.     nitroGLYCERIN (NITROSTAT) 0.4 MG SL tablet Place 1 tablet (0.4 mg  total) under the tongue every 5 (five) minutes x 3 doses as needed for chest pain. 10 tablet 0   olmesartan (BENICAR) 20 MG tablet Take 1 tablet (20 mg total) by mouth daily. (Patient taking differently: Take 10 mg by mouth in the morning.) 90 tablet 1   ondansetron (ZOFRAN-ODT) 4 MG disintegrating tablet Take 4 mg by mouth every 8 (eight) hours as needed for nausea or vomiting (dissolve in the mouth).     pantoprazole (PROTONIX) 40 MG tablet Take 1 tablet (40 mg total) by mouth 2 (two) times daily. 60 tablet 1   No current facility-administered medications for this visit.    Allergies  Allergen Reactions   Avelox [Moxifloxacin Hcl In Nacl] Palpitations and Other (See Comments)    Caused Heart Attack    Azithromycin Swelling and Other (See Comments)    Patient reported past history of lip swelling   Codeine Other (See Comments)    Dr. Terrence Dupont advised patient not to take this medication   Doxycycline Swelling and Other (See Comments)    Mouth, lips, feet swelling   Hydromorphone Palpitations and Other (See Comments)    DILAUDID  -  Pt had a Heart Attack after taking Dilaudid.   Levaquin [Levofloxacin] Shortness Of Breath and Other (See Comments)    Chest pressure, SOB, "pain in between shoulder blades", sweaty -as reported by patient per experience in ED this afternoon   Vitamin D Analogs Swelling and Other (See Comments)    Face and lips swell, but no breathing  issues   Nifedipine Er Other (See Comments)    Dropped the heart rate too low   Octreotide     Chest pain, N/V   Oxycodone-Acetaminophen Other (See Comments)    Says it makes her "feel weird"   Risedronate Other (See Comments)    Chest pain     REVIEW OF SYSTEMS:  [X]  denotes positive finding, [ ]  denotes negative finding Cardiac  Comments:  Chest pain or chest pressure:    Shortness of breath upon exertion:    Short of breath when lying flat:    Irregular heart rhythm:        Vascular    Pain in calf, thigh, or hip  brought on by ambulation:    Pain in feet at night that wakes you up from your sleep:     Blood clot in your veins:    Leg swelling:         Pulmonary    Oxygen at home:    Productive cough:     Wheezing:         Neurologic    Sudden weakness in arms or legs:     Sudden numbness in arms or legs:     Sudden onset of difficulty speaking or slurred speech:    Temporary loss of vision in one eye:     Problems with dizziness:         Gastrointestinal    Blood in stool:     Vomited blood:         Genitourinary    Burning when urinating:     Blood in urine:        Psychiatric    Major depression:         Hematologic    Bleeding problems:    Problems with blood clotting too easily:        Skin    Rashes or ulcers:        Constitutional    Fever or chills:      PHYSICAL EXAMINATION:  Vitals:   08/09/22 1504 08/09/22 1506  BP: (!) 171/69 (!) 158/78  Pulse: 68   Resp: 20   Temp: 98.3 F (36.8 C)   TempSrc: Temporal   SpO2: 96%   Weight: 145 lb 4.8 oz (65.9 kg)   Height: 5\' 2"  (1.575 m)     General:  WDWN in NAD; vital signs documented above Gait: Normal HENT: WNL, normocephalic Pulmonary: normal non-labored breathing , with wheezing Cardiac: regular HR, without  Murmurs without carotid bruit Abdomen: soft,  Vascular Exam/Pulses:  Right Left  Radial 2+ (normal) 2+ (normal)  Femoral 2+ (normal) 2+ (normal)  Popliteal 2+ (normal) 2+ (normal)  DP 2+ (normal) 2+ (normal)  PT 1+ (weak) 1+ (weak)   Extremities: without ischemic changes, without Gangrene , without cellulitis; without open wounds;  Musculoskeletal: no muscle wasting or atrophy  Neurologic: A&O X 3;  No focal weakness or paresthesias are detected Psychiatric:  The pt has Normal affect.   Non-Invasive Vascular Imaging:   Summary:  Right Carotid: Velocities in the right ICA are consistent with a 1-39% stenosis. The ECA appears >50% stenosed.   Left Carotid: Velocities in the left ICA are  consistent with a 1-39% stenosis.   Vertebrals: Bilateral vertebral arteries demonstrate antegrade flow.  Subclavians: Normal flow hemodynamics were seen in bilateral subclavian arteries.    ASSESSMENT/PLAN:: 81 y.o. female here for follow up from carotid artery disease. She is not currently having any symptoms attributable  to her carotid disease. No signs or symptoms of TIA or Stroke. Her duplex today shows 1-39% bilaterally. Normal flow in her vertebral and subclavian arteries. She has had recent heart attack or demand ischemia symptoms. Recommend she follow up with Cardiologist or if she has recurrent events she needs to go to ER. She has normal carotid duplex today and no signs of symptoms of PAD. She has iliac stents bilaterally. Her non invasive studies have been stable.  - Continue statin and Plavix - She knows to call for earlier follow up if any new or concerning symptoms  - She will follow up in 1 year with repeat carotid duplex and ABI   Karoline Caldwell, PA-C [facility] [phone]  Clinic MD:   Scot Dock

## 2022-08-22 ENCOUNTER — Ambulatory Visit (INDEPENDENT_AMBULATORY_CARE_PROVIDER_SITE_OTHER): Payer: 59 | Admitting: Pulmonary Disease

## 2022-08-22 ENCOUNTER — Encounter: Payer: Self-pay | Admitting: Physician Assistant

## 2022-08-22 ENCOUNTER — Encounter: Payer: Self-pay | Admitting: Pulmonary Disease

## 2022-08-22 VITALS — BP 140/60 | HR 65 | Ht 62.0 in | Wt 147.6 lb

## 2022-08-22 DIAGNOSIS — R911 Solitary pulmonary nodule: Secondary | ICD-10-CM | POA: Diagnosis not present

## 2022-08-22 DIAGNOSIS — Z923 Personal history of irradiation: Secondary | ICD-10-CM

## 2022-08-22 DIAGNOSIS — J449 Chronic obstructive pulmonary disease, unspecified: Secondary | ICD-10-CM

## 2022-08-22 DIAGNOSIS — Z951 Presence of aortocoronary bypass graft: Secondary | ICD-10-CM

## 2022-08-22 DIAGNOSIS — Z85118 Personal history of other malignant neoplasm of bronchus and lung: Secondary | ICD-10-CM | POA: Diagnosis not present

## 2022-08-22 NOTE — Progress Notes (Signed)
Synopsis: Referred in January 2020 to establish care, former patient of Dr. 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  She is followed with Dr. Earlie Server and Dr. Tammi Klippel.  In the oncology services.  She has an appointment with him tomorrow.  She had a recent CT scan completed yesterday which reveals right lower lobe groundglass opacities and scarring after stereotactic radiation to the right lower lobe.  She also has a small 7 mm central nodular location that will need to be followed.  She had PFTs completed today prior to her office visit.  The pulmonary function test revealed moderate COPD with an FEV1 65% postbronchodilator reduced ratio of 66, significant bronchodilator response with greater than 12% improvement, as well as a reduced DLCO 55% predicted.  Patient states today that she quit using her Anora inhaler several months ago because she did not know if it was making much difference in her breathing symptoms.  Before that she liked being on it.  There was some confusion regarding stopping blood pressure medication Azor versus stopping her Anoro.  She may have misunderstood that that information regarding changing of medications.  However she does have a stock supply of an oral at home that she could restart.  She did think it made a little bit of difference but was not sure.  At baseline she is dyspneic on exertion but she does not have any significant cough or daily sputum production.  OV 02/19/2019: recently in the hospital for TIA symptoms. No stroke. Now on plavix, taken off ASA. Followed planned with Dr. Julien Nordmann from oncology on 15th. Doing well since hospitalization. Concerned about her o2 levels. They were low a few  times in there hospital. Quit smoking in 2001.   OV 09/09/2019: Patient seen today for follow-up regarding her COPD.  Her symptoms are stable.  She denies chest pain nausea vomiting diarrhea.  She does have some shortness of breath with exertion.  She lives at home.  She is able to complete her activities of daily living.  She does have questions today regarding the Covid vaccine.  Patient was counseled on Covid vaccination and encouraged to obtain this next available opportunity.  OV 12/23/2019: Here today for follow-up regarding COPD.  Patient recently seen by Dr. Loanne Drilling for exacerbation and shortness of breath increasing shortness of breath.  She had inhaler change.  At this time doing well with the inhaler but still has some shortness of breath with significant exertion.  She wanted know if there is a different medication that may go to change to.  Denies weight loss denies hemoptysis.  She has follow-up scheduled with Dr. Earlie Server in July with a repeat CT scan.  OV 05/25/2020: Patient here today for follow-up regarding her COPD.  Overall doing well today.  Currently managed with her Trelegy inhaler.  She recently had palpitations and saw her cardiologist.  She has a Holter monitor in place attached to the left chest.  She has had a few fluttering events.  She does have dyspnea on exertion which has been stable for her.  She does need her flu shot today.  She has had both of her COVID-19 vaccinations.  She denies fevers chills  night sweats weight loss cough and sputum production.  OV 05/22/2021: Here today for 1 year follow-up.  Patient has no significant respiratory complaints today.  She feels short of breath when she exerts herself which is about her baseline she is currently managed with Trelegy and likes using this inhaler.  She would like to stay on it.  She had a recent CT scan of the chest in July which showed interval enlargement of a right upper lobe basilar nodule.  The lower lobe lesion  appeared stable.  We reviewed her CT scan today in the office.  OV 08/25/2021: Here today for follow-up after recent CT scan of the chest.  Looking at a lung nodule in the anterior portion of the right upper lobe with associated groundglass calcification.  This appears stable.  From respiratory standpoint doing well using Trelegy.  She does feel short of breath with exertion but she feels like it is around her baseline.  OV 03/07/2022: Here today for follow-up after recent CT scan of the chest.  Review recent CT in July shows mild interval increase in the right lower lobe lesion that she had had radiation before.  She has a subsolid lesion in the left lower lobe that is new compared to previous imaging.  From respiratory standpoint she doing well COPD maintenance on Trelegy.  As needed albuterol she recently had a surgery on the right hand for carpal tunnel and is doing well post surgery.  OV 08/22/2022: recently went to the ED. from respiratory standpoint she is back to her baseline.  She is breathing okay using her inhaler regimen.  She has follow-up imaging that was complete by Dr. Earlie Server back in October that shows stability and no evidence of recurrence of disease.  She has been working with cardiology as she has had these ongoing episodes of flush this with cold sweats that have been released and improved with nitro.  She had a recent nuclear stress test that was negative.  No cardiac catheterization due to her kidney disease.      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   Asthma    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   Blood in stool    last week of aug 2018   CAP (community acquired pneumonia) 10/22/2015   Chronic kidney disease    COPD (chronic obstructive pulmonary disease) (HCC)    Coronary artery disease    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   Diverticulitis     Dyspnea    with exertion   Gastroesophageal reflux disease    occ   Glaucoma    right eye   Headache syndrome 11/27/2018   Hiatal hernia    History of shingles 06/23/2018   Hyperlipidemia    Hypertension    Lung cancer ([place]) dx 2018   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   Myocardial infarction ([place]) 01/01/2000   Cardiac catheterization   Peripheral vascular disease ([place])    stents in legs x 2 or 3   Pneumonia    last time winter 2017 -2018   PONV (postoperative nausea and vomiting)    occassionally, last colonscopy did ok with anesthesia   Primary cancer of right lower lobe of lung ([place]) 04/25/2016   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   TIA (transient ischemic attack) 02/11/2019   Wears dentures    Full set  Wears glasses      Family History  Problem Relation Age of Onset   Heart disease Mother        Heart Disease before age 14   Hypertension Mother    Hyperlipidemia Mother    Heart attack Mother    Clotting disorder Mother    Heart disease Father        Heart Disease before age 81   Heart attack Father    Hyperlipidemia Father    Hypertension Father    Heart disease Brother        Heart Disease before age 70   Hyperlipidemia Brother    Hypertension Brother    Clotting disorder Brother    AAA (abdominal aortic aneurysm) Brother    Cerebral aneurysm Sister    Hypertension Sister    AAA (abdominal aortic aneurysm) Sister    Asthma Sister    Cerebral aneurysm Brother    Cancer Brother        Lung   Hypertension Brother    Heart attack Brother    Heart disease Brother        Aneurysm of Brain   Hypertension Brother    Heart disease Brother    Heart disease Brother    Stroke Son        Aneurysm of Stomach   AAA (abdominal aortic aneurysm) Son    Cancer Maternal Uncle        great uncle/cancer/type unknown     Past Surgical History:  Procedure Laterality Date   ABDOMINAL HYSTERECTOMY     APPENDECTOMY      Arnold-chiari malformation repair  1998   Suboccipital craniectomy   CAROTID ENDARTERECTOMY  03/29/2010   Left  CEA   CHOLECYSTECTOMY     Gall Bladder   COLONOSCOPY WITH PROPOFOL N/A 04/22/2015   Procedure: COLONOSCOPY WITH PROPOFOL;  Surgeon: Carol Ada, MD;  Location: [facility];  Service: Endoscopy;  Laterality: N/A;   COLONOSCOPY WITH PROPOFOL N/A 05/25/2016   Procedure: COLONOSCOPY WITH PROPOFOL;  Surgeon: Carol Ada, MD;  [facility];  Service: Endoscopy;  Laterality: N/A;   COLONOSCOPY WITH PROPOFOL N/A 05/03/2017   Procedure: COLONOSCOPY WITH PROPOFOL;  Surgeon: Carol Ada, MD;  [facility];  Service: Endoscopy;  Laterality: N/A;   COLONOSCOPY WITH PROPOFOL N/A 06/29/2020   Procedure: COLONOSCOPY WITH PROPOFOL;  Surgeon: Carol Ada, MD;  [facility];  Service: Endoscopy;  Laterality: N/A;   COLONOSCOPY WITH PROPOFOL N/A 01/05/2021   Procedure: COLONOSCOPY WITH PROPOFOL;  Surgeon: Carol Ada, MD;  [facility];  Service: Endoscopy;  Laterality: N/A;   COLONOSCOPY WITH PROPOFOL N/A 09/28/2021   Procedure: COLONOSCOPY WITH PROPOFOL;  Surgeon: Carol Ada, MD;  Location: [facility];  Service: Endoscopy;  Laterality: N/A;   CORNEAL TRANSPLANT     Right   CORONARY ARTERY BYPASS GRAFT  01/01/2000   x 3   ENDARTERECTOMY Right 09/26/2021   Procedure: RIGHT CAROTID ENDARTERECTOMY;  Surgeon: [name], MD;  Location: [facility];  Service: Vascular;  Laterality: Right;   ENTEROSCOPY N/A 02/27/2018   Procedure: ENTEROSCOPY;  Surgeon: Carol Ada, MD;  [facility];  Service: Endoscopy;  Laterality: N/A;   ENTEROSCOPY N/A 07/18/2018   Procedure: ENTEROSCOPY;  Surgeon: Carol Ada, MD;  [facility];  Service: Endoscopy;  Laterality: N/A;   ENTEROSCOPY N/A 06/29/2020   Procedure: ENTEROSCOPY;  Surgeon: Carol Ada, MD;  [facility];  Service: Endoscopy;  Laterality: N/A;   ENTEROSCOPY N/A 12/05/2021  Procedure: ENTEROSCOPY;  Surgeon: Carol Ada, MD;  Location[facility];  Service: Gastroenterology;  Laterality: N/A;   ENTEROSCOPY N/A 12/25/2021   Procedure: ENTEROSCOPY;  Surgeon: Carol Ada, MD;  [facility];  Service: Gastroenterology;  Laterality: N/A;  with endoscopic placement of video capsule   ESOPHAGOGASTRODUODENOSCOPY N/A 05/25/2016   Procedure: ESOPHAGOGASTRODUODENOSCOPY (EGD);  Surgeon: Carol Ada, MD;  Location: [facility];  Service: Endoscopy;  Laterality: N/A;   ESOPHAGOGASTRODUODENOSCOPY N/A 08/01/2018   Procedure: ESOPHAGOGASTRODUODENOSCOPY (EGD);  Surgeon: Carol Ada, MD;  Location: [facility];  Service: Endoscopy;  Laterality: N/A;   ESOPHAGOGASTRODUODENOSCOPY (EGD) WITH PROPOFOL N/A 09/28/2021   Procedure: ESOPHAGOGASTRODUODENOSCOPY (EGD) WITH PROPOFOL;  Surgeon: Carol Ada, MD;  Location: [facility];  Service: Endoscopy;  Laterality: N/A;   EYE SURGERY Right 1995 or 1996   Laser surgery for retinal hemorrhage   GIVENS CAPSULE STUDY N/A 07/16/2018   Procedure: GIVENS CAPSULE STUDY;  Surgeon: Carol Ada, MD;  [facility];  Service: Endoscopy;  Laterality: N/A;   GIVENS CAPSULE STUDY N/A 12/14/2021   Procedure: GIVENS CAPSULE STUDY;  Surgeon: Carol Ada, MD;  Location: [facility];  Service: Gastroenterology;  Laterality: N/A;   GIVENS CAPSULE STUDY N/A 12/25/2021   Procedure: GIVENS CAPSULE STUDY;  Surgeon: Carol Ada, MD;  [facility];  Service: Gastroenterology;  Laterality: N/A;   HEMOSTASIS CLIP PLACEMENT  06/29/2020   Procedure: HEMOSTASIS CLIP PLACEMENT;  Surgeon: Carol Ada, MD;  [facility];  Service: Endoscopy;;   HEMOSTASIS CLIP PLACEMENT  01/05/2021   Procedure: HEMOSTASIS CLIP PLACEMENT;  Surgeon: Carol Ada, MD;  [facility];  Service: Endoscopy;;   HOT HEMOSTASIS N/A 02/27/2018   Procedure: HOT HEMOSTASIS (ARGON PLASMA COAGULATION/BICAP);  Surgeon: Carol Ada, MD;   Location: [facility];  Service: Endoscopy;  Laterality: N/A;   HOT HEMOSTASIS N/A 08/01/2018   Procedure: HOT HEMOSTASIS (ARGON PLASMA COAGULATION/BICAP);  Surgeon: Carol Ada, MD;  Location: [facility];  Service: Endoscopy;  Laterality: N/A;   HOT HEMOSTASIS N/A 06/29/2020   Procedure: HOT HEMOSTASIS (ARGON PLASMA COAGULATION/BICAP);  Surgeon: Carol Ada, MD;  Location: [facility];  Service: Endoscopy;  Laterality: N/A;   HOT HEMOSTASIS N/A 01/05/2021   Procedure: HOT HEMOSTASIS (ARGON PLASMA COAGULATION/BICAP);  Surgeon: Carol Ada, MD;  Location[facility];  Service: Endoscopy;  Laterality: N/A;   HOT HEMOSTASIS N/A 12/05/2021   Procedure: HOT HEMOSTASIS (ARGON PLASMA COAGULATION/BICAP);  Surgeon: Carol Ada, MD;  Location: [facility];  Service: Gastroenterology;  Laterality: N/A;   IR RADIOLOGIST EVAL & MGMT  12/14/2016   IR RADIOLOGIST EVAL & MGMT  07/26/2021   LEFT HEART CATH AND CORS/GRAFTS ANGIOGRAPHY N/A 01/09/2018   Procedure: LEFT HEART CATH AND CORS/GRAFTS ANGIOGRAPHY;  Surgeon: Charolette Forward, MD;  Location: [facility];  Service: Cardiovascular;  Laterality: N/A;   LEFT HEART CATHETERIZATION WITH CORONARY ANGIOGRAM N/A 08/03/2014   Procedure: LEFT HEART CATHETERIZATION WITH CORONARY ANGIOGRAM;  Surgeon: Birdie Riddle, MD;  Location: [facility];  Service: Cardiovascular;  Laterality: N/A;   PATCH ANGIOPLASTY Right 09/26/2021   Procedure: PATCH ANGIOPLASTY RIGHT CAROTID;  Surgeon: [name], MD;  [facility];  Service: Vascular;  Laterality: Right;   Post Coronary Artery  BPG  01/05/2000   Right jugular sheath removed   PR VEIN BYPASS GRAFT,AORTO-FEM-POP     ROTATOR CUFF REPAIR     Right    Social History   Socioeconomic History   Marital status: Widowed    Spouse name: Not on file   Number  of children: 4   Years of education: 7TH   Highest education level: Not on file  Occupational History   Not on file  Tobacco Use   Smoking  status: Former    Packs/day: 1.50    Years: 30.00    Total pack years: 45.00    Types: Cigarettes    Quit date: 08/13/2000    Years since quitting: 22.0    Passive exposure: Never   Smokeless tobacco: Never  Vaping Use   Vaping Use: Never used  Substance and Sexual Activity   Alcohol use: No    Alcohol/week: 0.0 standard drinks of alcohol   Drug use: No   Sexual activity: Never  Other Topics Concern   Not on file  Social History Narrative   Lives at home w/ her son   Right-handed   Drinks coffee and [employer] daily   Social Determinants of Health   Financial Resource Strain: Not on file  Food Insecurity: Not on file  Transportation Needs: Not on file  Physical Activity: Not on file  Stress: Not on file  Social Connections: Not on file  Intimate Partner Violence: Not on file     Allergies  Allergen Reactions   Avelox [Moxifloxacin Hcl In Nacl] Palpitations and Other (See Comments)    Caused Heart Attack    Azithromycin Swelling and Other (See Comments)    Patient reported past history of lip swelling   Codeine Other (See Comments)    Dr. Terrence Dupont advised patient not to take this medication   Doxycycline Swelling and Other (See Comments)    Mouth, lips, feet swelling   Hydromorphone Palpitations and Other (See Comments)    DILAUDID  -  Pt had a Heart Attack after taking Dilaudid.   Levaquin [Levofloxacin] Shortness Of Breath and Other (See Comments)    Chest pressure, SOB, "pain in between shoulder blades", sweaty -as reported by patient per experience in ED this afternoon   Vitamin D Analogs Swelling and Other (See Comments)    Face and lips swell, but no breathing issues   Nifedipine Er Other (See Comments)    Dropped the heart rate too low   Octreotide     Chest pain, N/V   Oxycodone-Acetaminophen Other (See Comments)    Says it makes her "feel weird"   Risedronate Other (See Comments)    Chest pain     Outpatient Medications Prior to Visit  Medication Sig  Dispense Refill   albuterol (VENTOLIN HFA) 108 (90 Base) MCG/ACT inhaler Inhale 2 puffs into the lungs every 6 (six) hours as needed for wheezing or shortness of breath. 8 g 6   cetirizine (ZYRTEC) 10 MG tablet Take 10 mg by mouth daily as needed for allergies.      Fluticasone-Umeclidin-Vilant (TRELEGY ELLIPTA) 200-62.5-25 MCG/ACT AEPB Inhale 1 puff into the lungs daily. 180 each 3   acetaminophen (TYLENOL) 500 MG tablet Take 500-1,000 mg by mouth every 6 (six) hours as needed (for pain).     amoxicillin-clavulanate (AUGMENTIN) 500-125 MG tablet Take 1 tablet (500 mg total) by mouth 2 (two) times daily. 14 tablet 0   atorvastatin (LIPITOR) 40 MG tablet Take 1 tablet (40 mg total) by mouth at bedtime. 30 tablet 6   clopidogrel (PLAVIX) 75 MG tablet Take 1 tablet (75 mg total) by mouth daily. (Patient taking differently: Take 37.5 mg by mouth in the morning.) 90 tablet 3   fluorouracil (EFUDEX) 5 % cream Apply topically.     furosemide (LASIX) 40  MG tablet Take 40 mg by mouth in the morning.     Magnesium Oxide 400 MG CAPS Take 400 mg by mouth in the morning.     nitroGLYCERIN (NITROSTAT) 0.4 MG SL tablet Place 1 tablet (0.4 mg total) under the tongue every 5 (five) minutes x 3 doses as needed for chest pain. 10 tablet 0   olmesartan (BENICAR) 20 MG tablet Take 1 tablet (20 mg total) by mouth daily. (Patient taking differently: Take 10 mg by mouth in the morning.) 90 tablet 1   ondansetron (ZOFRAN-ODT) 4 MG disintegrating tablet Take 4 mg by mouth every 8 (eight) hours as needed for nausea or vomiting (dissolve in the mouth).     pantoprazole (PROTONIX) 40 MG tablet Take 1 tablet (40 mg total) by mouth 2 (two) times daily. 60 tablet 1   No facility-administered medications prior to visit.    Review of Systems  Constitutional:  Negative for chills, fever, malaise/fatigue and weight loss.  HENT:  Negative for hearing loss, sore throat and tinnitus.   Eyes:  Negative for blurred vision and double  vision.  Respiratory:  Positive for shortness of breath. Negative for cough, hemoptysis, sputum production, wheezing and stridor.   Cardiovascular:  Negative for chest pain, palpitations, orthopnea, leg swelling and PND.  Gastrointestinal:  Negative for abdominal pain, constipation, diarrhea, heartburn, nausea and vomiting.  Genitourinary:  Negative for dysuria, hematuria and urgency.  Musculoskeletal:  Negative for joint pain and myalgias.  Skin:  Negative for itching and rash.  Neurological:  Negative for dizziness, tingling, weakness and headaches.  Endo/Heme/Allergies:  Negative for environmental allergies. Does not bruise/bleed easily.  Psychiatric/Behavioral:  Negative for depression. The patient is not nervous/anxious and does not have insomnia.   All other systems reviewed and are negative.    Objective:  Physical Exam Vitals reviewed.  Constitutional:      General: She is not in acute distress.    Appearance: She is well-developed.  HENT:     Head: Normocephalic and atraumatic.  Eyes:     General: No scleral icterus.    Conjunctiva/sclera: Conjunctivae normal.     Pupils: Pupils are equal, round, and reactive to light.  Neck:     Vascular: No JVD.     Trachea: No tracheal deviation.  Cardiovascular:     Rate and Rhythm: Normal rate and regular rhythm.     Heart sounds: Normal heart sounds. No murmur heard. Pulmonary:     Effort: Pulmonary effort is normal. No tachypnea, accessory muscle usage or respiratory distress.     Breath sounds: No stridor. No wheezing, rhonchi or rales.     Comments: Diminished breath sounds bilaterally no crackles no wheeze Abdominal:     General: There is no distension.     Palpations: Abdomen is soft.     Tenderness: There is no abdominal tenderness.  Musculoskeletal:        General: No tenderness.     Cervical back: Neck supple.  Lymphadenopathy:     Cervical: No cervical adenopathy.  Skin:    General: Skin is warm and dry.      Capillary Refill: Capillary refill takes less than 2 seconds.     Findings: No rash.  Neurological:     Mental Status: She is alert and oriented to person, place, and time.  Psychiatric:        Behavior: Behavior normal.     Vitals:   08/22/22 1427  BP: (!) 140/60  Pulse: 65  SpO2:  97%  Weight: 147 lb 9.6 oz (67 kg)  Height: 5\' 2"  (1.575 m)   97% on RA BMI Readings from Last 3 Encounters:  08/22/22 27.00 kg/m  08/09/22 26.58 kg/m  08/01/22 26.57 kg/m   Wt Readings from Last 3 Encounters:  08/22/22 147 lb 9.6 oz (67 kg)  08/09/22 145 lb 4.8 oz (65.9 kg)  08/01/22 145 lb 4.5 oz (65.9 kg)   Chest Imaging:  08/25/2018 CT Chest:  Imaging reviewed.  Right lower lobe areas of groundglass and scarring status post sterotactic radiation likely radiation-induced parenchymal scarring.  Small nodular density within this area 7 mm in size.  She also has a new associated/adjacent rib fracture within that right side which may be concerning and would need to be followed for any recurrence of squamous cell disease. The patient's images have been independently reviewed by me.    02/23/2019 CT Chest: Follow up scheduled for cancer eval and staging   Chest x-ray 11/10/2019: Chronic scarring in the bases.  Mildly hyperinflated lung fields consistent with COPD diagnosis. The patient's images have been independently reviewed by me.    02/27/2021: CT scan of the chest Irregular 1.4 cm sup solid anterior basilar right upper lobe pulmonary nodule.  Slowly enlarging over time. Also has masslike fibrosis in the right lower lobe. The patient's images have been independently reviewed by me.    08/22/2021 CT chest: Nodule within the right lower lobe stable post treatments.  Right upper lobe anterior lung nodule stable in appearance The patient's images have been independently reviewed by me.    July 2023: CT chest: Mild interval enlargement in the right lower lobe lesion that had previous  radiation.  Has a new subsolid lesion in the left lower lobe. The patient's images have been independently reviewed by me.   October 2023 CT chest: No evidence of recurrence of disease stable treated right lower lobe nodule with radiation changes Chronic fracture of the right seventh rib. The patient's images have been independently reviewed by me.     Pulmonary Functions Testing Results:    Latest Ref Rng & Units 08/26/2018   12:59 PM 02/14/2018   11:18 AM 04/10/2016    9:11 AM  PFT Results  FVC-Pre L 1.69  1.80  P 1.53   FVC-Predicted Pre % 65  69  P 57   FVC-Post L 1.91  1.99  P 1.82   FVC-Predicted Post % 74  77  P 69   Pre FEV1/FVC % % 67  72  P 71   Post FEV1/FCV % % 66  72  P 70   FEV1-Pre L 1.12  1.30  P 1.09   FEV1-Predicted Pre % 58  67  P 54   FEV1-Post L 1.26  1.43  P 1.28   DLCO uncorrected ml/min/mmHg 11.93   11.07   DLCO UNC% % 55   51   DLCO corrected ml/min/mmHg 13.35     DLCO COR %Predicted % 61     DLVA Predicted % 97   86   TLC L 3.85   3.77   TLC % Predicted % 81   79   RV % Predicted % 94   96     P Preliminary result     FeNO: None   Pathology:   Squamous cell carcinoma of the lung right lower lobe status post stereotactic radiation therapy  Echocardiogram: none recent   Heart Catheterization:   Jan 09, 2018: Dr. Nelta Numbers LAD to Prox  LAD lesion is 80% stenosed. Ost 1st Diag lesion is 100% stenosed. Prox RCA lesion is 20% stenosed. Mid RCA lesion is 30% stenosed. Mid LM to Dist LM lesion is 40% stenosed. Ost Ramus lesion is 40% stenosed. Ost Cx lesion is 40% stenosed. Origin lesion is 100% stenosed. Origin to Prox Graft lesion is 100% stenosed. The left ventricular systolic function is normal. LV end diastolic pressure is normal. The left ventricular ejection fraction is 50-55% by visual estimate.    Assessment & Plan:     ICD-10-CM   1. Stage 2 moderate COPD by GOLD classification ([place])  J44.9     2. Lung nodule  R91.1     3.  Hx of radiation therapy  Z92.3     4. Hx of cancer of lung  Z85.118     5. S/P CABG x 3  Z95.1        Assessment:   This is a 82 year old female seen in clinic for stage II COPD, history of right lower lobe lung cancer status post radiation history of CABG, new left lower lobe subsolid nodule that we have been following.  It is stable.  No evidence of recurrence of disease on recent CT imaging for malignancy.  Multiple episodes recently that required hospitalization and evaluation by cardiology for chest pain that still on the left side that she has cold sweats related to.  She feels better with nitro.  She had a recent stress test that was negative.  Daughter thinks a portion of these episodes related to anxiety.  Plan: Follow-up CT imaging with Dr. Earlie Server and medical oncology for surveillance. From a COPD standpoint continue Trelegy and as needed albuterol. Follow-up with cardiology as scheduled. RTC in 6 months or as needed.    Current Outpatient Medications:    albuterol (VENTOLIN HFA) 108 (90 Base) MCG/ACT inhaler, Inhale 2 puffs into the lungs every 6 (six) hours as needed for wheezing or shortness of breath., Disp: 8 g, Rfl: 6   cetirizine (ZYRTEC) 10 MG tablet, Take 10 mg by mouth daily as needed for allergies. , Disp: , Rfl:    Fluticasone-Umeclidin-Vilant (TRELEGY ELLIPTA) 200-62.5-25 MCG/ACT AEPB, Inhale 1 puff into the lungs daily., Disp: 180 each, Rfl: 3   acetaminophen (TYLENOL) 500 MG tablet, Take 500-1,000 mg by mouth every 6 (six) hours as needed (for pain)., Disp: , Rfl:    amoxicillin-clavulanate (AUGMENTIN) 500-125 MG tablet, Take 1 tablet (500 mg total) by mouth 2 (two) times daily., Disp: 14 tablet, Rfl: 0   atorvastatin (LIPITOR) 40 MG tablet, Take 1 tablet (40 mg total) by mouth at bedtime., Disp: 30 tablet, Rfl: 6   clopidogrel (PLAVIX) 75 MG tablet, Take 1 tablet (75 mg total) by mouth daily. (Patient taking differently: Take 37.5 mg by mouth in the  morning.), Disp: 90 tablet, Rfl: 3   fluorouracil (EFUDEX) 5 % cream, Apply topically., Disp: , Rfl:    furosemide (LASIX) 40 MG tablet, Take 40 mg by mouth in the morning., Disp: , Rfl:    Magnesium Oxide 400 MG CAPS, Take 400 mg by mouth in the morning., Disp: , Rfl:    nitroGLYCERIN (NITROSTAT) 0.4 MG SL tablet, Place 1 tablet (0.4 mg total) under the tongue every 5 (five) minutes x 3 doses as needed for chest pain., Disp: 10 tablet, Rfl: 0   olmesartan (BENICAR) 20 MG tablet, Take 1 tablet (20 mg total) by mouth daily. (Patient taking differently: Take 10 mg by mouth in the morning.), Disp: 90  tablet, Rfl: 1   ondansetron (ZOFRAN-ODT) 4 MG disintegrating tablet, Take 4 mg by mouth every 8 (eight) hours as needed for nausea or vomiting (dissolve in the mouth)., Disp: , Rfl:    pantoprazole (PROTONIX) 40 MG tablet, Take 1 tablet (40 mg total) by mouth 2 (two) times daily., Disp: 60 tablet, Rfl: 1    Garner Nash, DO [facility] 08/22/2022 4:30 PM

## 2022-08-22 NOTE — Patient Instructions (Addendum)
Thank you for visiting Dr. Valeta Harms at [facility]. Today we recommend the following:  Continue current trelegy inhaler  Continue current albuterol inhaler  Return in about 6 months (around 02/20/2023) for with Roxan Diesel, NP or Dr. Valeta Harms.    Please do your part to reduce the spread of COVID-19.

## 2022-08-24 ENCOUNTER — Other Ambulatory Visit: Payer: Self-pay | Admitting: Nurse Practitioner

## 2022-08-27 ENCOUNTER — Ambulatory Visit: Payer: Medicare Other | Admitting: Pulmonary Disease

## 2022-09-05 ENCOUNTER — Telehealth: Payer: Self-pay | Admitting: Pulmonary Disease

## 2022-09-05 NOTE — Telephone Encounter (Signed)
Spoke to patient she states for about 2 weeks now she has been experiencing pain in her lungs, she also has a productive cough but is currently on a prednisone taper for that. Pt states she has been using a heating pad to try and relieve some of the pain.  Dr. Valeta Harms please advise  Pharmacy [facility] on [address]

## 2022-09-05 NOTE — Telephone Encounter (Signed)
Spoke with patient gave her Dr. Fabio Bering recommendations.State she spoke to pcp he advised she continue taking tylenol.  She verbalized understanding. NFN

## 2022-09-05 NOTE — Telephone Encounter (Signed)
Spoke with patient. Advised of Dr. Juline Patch recommendations. Pt states she has been using the extra strength tylenol and states it has not helped with the pain. She states the pain seems to be getting worse. Wants to know if you recommend anything else. Pt also is wanting to know if you looked at her most recent xray from [place]. States this was done in December.  Dr. Valeta Harms please advise

## 2022-09-10 ENCOUNTER — Emergency Department (HOSPITAL_COMMUNITY)
Admission: EM | Admit: 2022-09-10 | Discharge: 2022-09-10 | Disposition: A | Discharge status: Home / Self Care | Payer: 59 | Attending: Emergency Medicine | Admitting: Emergency Medicine

## 2022-09-10 ENCOUNTER — Encounter (HOSPITAL_COMMUNITY): Payer: Self-pay

## 2022-09-10 ENCOUNTER — Emergency Department (HOSPITAL_COMMUNITY): Payer: 59

## 2022-09-10 DIAGNOSIS — R0602 Shortness of breath: Secondary | ICD-10-CM | POA: Diagnosis not present

## 2022-09-10 DIAGNOSIS — S299XXA Unspecified injury of thorax, initial encounter: Secondary | ICD-10-CM | POA: Diagnosis present

## 2022-09-10 DIAGNOSIS — I2510 Atherosclerotic heart disease of native coronary artery without angina pectoris: Secondary | ICD-10-CM | POA: Insufficient documentation

## 2022-09-10 DIAGNOSIS — J449 Chronic obstructive pulmonary disease, unspecified: Secondary | ICD-10-CM | POA: Diagnosis not present

## 2022-09-10 DIAGNOSIS — S2241XA Multiple fractures of ribs, right side, initial encounter for closed fracture: Secondary | ICD-10-CM | POA: Insufficient documentation

## 2022-09-10 DIAGNOSIS — Z85118 Personal history of other malignant neoplasm of bronchus and lung: Secondary | ICD-10-CM | POA: Diagnosis not present

## 2022-09-10 DIAGNOSIS — X58XXXA Exposure to other specified factors, initial encounter: Secondary | ICD-10-CM | POA: Diagnosis not present

## 2022-09-10 DIAGNOSIS — Z20822 Contact with and (suspected) exposure to covid-19: Secondary | ICD-10-CM | POA: Insufficient documentation

## 2022-09-10 DIAGNOSIS — D649 Anemia, unspecified: Secondary | ICD-10-CM | POA: Insufficient documentation

## 2022-09-10 DIAGNOSIS — Z7902 Long term (current) use of antithrombotics/antiplatelets: Secondary | ICD-10-CM | POA: Diagnosis not present

## 2022-09-10 DIAGNOSIS — I509 Heart failure, unspecified: Secondary | ICD-10-CM | POA: Insufficient documentation

## 2022-09-10 LAB — COMPREHENSIVE METABOLIC PANEL
ALT: 10 U/L (ref 0–44)
AST: 20 U/L (ref 15–41)
Albumin: 3.5 g/dL (ref 3.5–5.0)
Alkaline Phosphatase: 60 U/L (ref 38–126)
Anion gap: 15 (ref 5–15)
BUN: 42 mg/dL — ABNORMAL HIGH (ref 8–23)
CO2: 20 mmol/L — ABNORMAL LOW (ref 22–32)
Calcium: 9 mg/dL (ref 8.9–10.3)
Chloride: 104 mmol/L (ref 98–111)
Creatinine, Ser: 1.67 mg/dL — ABNORMAL HIGH (ref 0.44–1.00)
GFR, Estimated: 31 mL/min — ABNORMAL LOW (ref >60)
Glucose, Bld: 105 mg/dL — ABNORMAL HIGH (ref 70–99)
Potassium: 4.6 mmol/L (ref 3.5–5.1)
Sodium: 139 mmol/L (ref 135–145)
Total Bilirubin: 0.5 mg/dL (ref 0.3–1.2)
Total Protein: 6.8 g/dL (ref 6.5–8.1)

## 2022-09-10 LAB — RESP PANEL BY RT-PCR (RSV, FLU A&B, COVID)  RVPGX2
Influenza A by PCR: NEGATIVE
Influenza B by PCR: NEGATIVE
Resp Syncytial Virus by PCR: NEGATIVE
SARS Coronavirus 2 by RT PCR: NEGATIVE

## 2022-09-10 LAB — BRAIN NATRIURETIC PEPTIDE: B Natriuretic Peptide: 68 pg/mL (ref 0.0–100.0)

## 2022-09-10 LAB — I-STAT CHEM 8, ED
BUN: 33 mg/dL — ABNORMAL HIGH (ref 8–23)
Calcium, Ion: 1.07 mmol/L — ABNORMAL LOW (ref 1.15–1.40)
Chloride: 107 mmol/L (ref 98–111)
Creatinine, Ser: 1.8 mg/dL — ABNORMAL HIGH (ref 0.44–1.00)
Glucose, Bld: 103 mg/dL — ABNORMAL HIGH (ref 70–99)
HCT: 27 % — ABNORMAL LOW (ref 36.0–46.0)
Hemoglobin: 9.2 g/dL — ABNORMAL LOW (ref 12.0–15.0)
Potassium: 4.5 mmol/L (ref 3.5–5.1)
Sodium: 138 mmol/L (ref 135–145)
TCO2: 24 mmol/L (ref 22–32)

## 2022-09-10 LAB — TROPONIN I (HIGH SENSITIVITY): Troponin I (High Sensitivity): 5 ng/L (ref <18)

## 2022-09-10 MED ORDER — LIDOCAINE 5 % EX PTCH
1.0000 | MEDICATED_PATCH | Freq: Once | CUTANEOUS | Status: DC
  Administered 2022-09-10: 1 via TRANSDERMAL
  Filled 2022-09-10: qty 1

## 2022-09-10 MED ORDER — LIDOCAINE 5 % EX PTCH: 1.0000 | MEDICATED_PATCH | CUTANEOUS | Status: DC

## 2022-09-10 MED ORDER — BENZONATATE 100 MG PO CAPS: 100.0000 mg | ORAL_CAPSULE | Freq: Three times a day (TID) | ORAL | 0 refills | Status: DC

## 2022-09-10 MED ORDER — ACETAMINOPHEN 500 MG PO TABS
1000.0000 mg | ORAL_TABLET | Freq: Once | ORAL | Status: AC
  Administered 2022-09-10: 1000 mg via ORAL
  Filled 2022-09-10: qty 2

## 2022-09-10 MED ORDER — LIDOCAINE 5 % EX PTCH: 1.0000 | MEDICATED_PATCH | CUTANEOUS | 0 refills | Status: DC

## 2022-09-10 NOTE — ED Provider Notes (Signed)
[facility] Provider Note   CSN: [id number] Arrival date & time: 09/10/22  6295     History  Chief Complaint  Patient presents with   Shortness of Breath    [name] is a 82 y.o. female history of lung cancer, COPD, congestive heart failure, CAD, ACS, presented with 2 weeks of shortness of breath.  Patient endorsed posterior right-sided back when she takes a deep breath or moves and has been taking 1000 mg Tylenol with no relief the past 2 weeks.  Patient is on Lasix and stated that she has been compliant but states this pain is similar to previous episodes when she has had fluid in her lungs.  Patient states she has been having clear sputum production when she coughs.  Patient denied any blood thinners.  Patient stated it is harder to breathe when she is lying down.  Patient denied chest pain, syncope, nausea/vomiting/diarrhea, changes in sensation/motor skills, abdominal pain, dysuria, fevers, recent illnesses, sick contacts, hemoptysis, lower leg edema  Home Medications Prior to Admission medications   Medication Sig Start Date End Date Taking? Authorizing Provider  benzonatate (TESSALON) 100 MG capsule Take 1 capsule (100 mg total) by mouth every 8 (eight) hours. 09/10/22  Yes Ayesha Markwell, Jeneen Rinks T, PA-C  lidocaine (LIDODERM) 5 % Place 1 patch onto the skin daily. Remove & Discard patch within 12 hours or as directed by MD 09/10/22  Yes Amalea Ottey, Florene Route, PA-C  acetaminophen (TYLENOL) 500 MG tablet Take 500-1,000 mg by mouth every 6 (six) hours as needed (for pain).    [provider]  albuterol (VENTOLIN HFA) 108 (90 Base) MCG/ACT inhaler Inhale 2 puffs into the lungs every 6 (six) hours as needed for wheezing or shortness of breath. 05/25/20   Icard, Leory Plowman L, DO  amoxicillin-clavulanate (AUGMENTIN) 500-125 MG tablet Take 1 tablet (500 mg total) by mouth 2 (two) times daily. 04/07/22   Sherrell Puller, PA-C  atorvastatin (LIPITOR) 40  MG tablet Take 1 tablet (40 mg total) by mouth at bedtime. 09/29/21   Rhyne, Hulen Shouts, PA-C  cetirizine (ZYRTEC) 10 MG tablet Take 10 mg by mouth daily as needed for allergies.     [provider]  clopidogrel (PLAVIX) 75 MG tablet Take 1 tablet (75 mg total) by mouth daily. Patient taking differently: Take 37.5 mg by mouth in the morning. 07/08/20   Mercy Riding, MD  fluorouracil (EFUDEX) 5 % cream Apply topically. 04/25/22   [provider]  Fluticasone-Umeclidin-Vilant (TRELEGY ELLIPTA) 200-62.5-25 MCG/ACT AEPB Inhale 1 puff into the lungs daily. 06/15/21   Garner Nash, DO  furosemide (LASIX) 40 MG tablet Take 40 mg by mouth in the morning. 01/30/21   [provider]  Magnesium Oxide 400 MG CAPS Take 400 mg by mouth in the morning.    [provider]  nitroGLYCERIN (NITROSTAT) 0.4 MG SL tablet Place 1 tablet (0.4 mg total) under the tongue every 5 (five) minutes x 3 doses as needed for chest pain. 08/01/22   Cobb, Karie Schwalbe, NP  olmesartan (BENICAR) 20 MG tablet Take 1 tablet (20 mg total) by mouth daily. Patient taking differently: Take 10 mg by mouth in the morning. 07/04/20   Mercy Riding, MD  ondansetron (ZOFRAN-ODT) 4 MG disintegrating tablet Take 4 mg by mouth every 8 (eight) hours as needed for nausea or vomiting (dissolve in the mouth). 03/29/21   [provider]  pantoprazole (PROTONIX) 40 MG tablet TAKE 1 TABLET  BY MOUTH TWICE A DAY 08/24/22   Cobb, Karie Schwalbe, NP      Allergies    Avelox [moxifloxacin hcl in nacl], Azithromycin, Codeine, Doxycycline, Hydromorphone, Levaquin [levofloxacin], Vitamin d analogs, Nifedipine er, Octreotide, Oxycodone-acetaminophen, and Risedronate    Review of Systems   Review of Systems  Respiratory:  Positive for shortness of breath.   See HPI  Physical Exam Updated Vital Signs BP (!) 154/52   Pulse (!) 58   Temp 98.2 F (36.8 C) (Oral)   Resp 18   Ht 5\' 2"  (1.575 m)   Wt 65.3 kg   SpO2 97%    BMI 26.34 kg/m  Physical Exam Vitals and nursing note reviewed.  Constitutional:      General: She is not in acute distress.    Appearance: She is well-developed.  HENT:     Head: Normocephalic and atraumatic.  Eyes:     Conjunctiva/sclera: Conjunctivae normal.  Cardiovascular:     Rate and Rhythm: Normal rate and regular rhythm.     Pulses: Normal pulses.     Heart sounds: Normal heart sounds. No murmur heard. Pulmonary:     Effort: Pulmonary effort is normal. No respiratory distress.     Breath sounds: Normal breath sounds. No decreased breath sounds, wheezing, rhonchi or rales.  Abdominal:     Palpations: Abdomen is soft.     Tenderness: There is no abdominal tenderness. There is no guarding.  Genitourinary:    Comments: Chaperone present: Nurse tech Doren Custard No overlying skin changes or abnormalities noted on rectal exam. Fecal occult test was performed and no blood noted Musculoskeletal:        General: No swelling. Normal range of motion.     Cervical back: Normal range of motion and neck supple.     Right lower leg: No edema.     Left lower leg: No edema.     Comments: Patient was tender on the posterior right ribs with palpation and stated when she takes a deep breath she notices pain there No deformity palpated  Skin:    General: Skin is warm and dry.     Capillary Refill: Capillary refill takes less than 2 seconds.  Neurological:     General: No focal deficit present.     Mental Status: She is alert and oriented to person, place, and time.  Psychiatric:        Mood and Affect: Mood normal.     ED Results / Procedures / Treatments   Labs (all labs ordered are listed, but only abnormal results are displayed) Labs Reviewed  COMPREHENSIVE METABOLIC PANEL - Abnormal; Notable for the following components:      Result Value   CO2 20 (*)    Glucose, Bld 105 (*)    BUN 42 (*)    Creatinine, Ser 1.67 (*)    GFR, Estimated 31 (*)    All other components  within normal limits  I-STAT CHEM 8, ED - Abnormal; Notable for the following components:   BUN 33 (*)    Creatinine, Ser 1.80 (*)    Glucose, Bld 103 (*)    Calcium, Ion 1.07 (*)    Hemoglobin 9.2 (*)    HCT 27.0 (*)    All other components within normal limits  RESP PANEL BY RT-PCR (RSV, FLU A&B, COVID)  RVPGX2  BRAIN NATRIURETIC PEPTIDE  OCCULT BLOOD X 1 CARD TO LAB, STOOL  TROPONIN I (HIGH SENSITIVITY)  TROPONIN I (HIGH SENSITIVITY)  EKG EKG Interpretation  Date/Time:  Monday September 10 2022 08:17:09 EST Ventricular Rate:  57 PR Interval:  156 QRS Duration: 83 QT Interval:  404 QTC Calculation: 394 R Axis:   55 Text Interpretation: Sinus rhythm Confirmed by Lacretia Leigh ([id number]) on 09/10/2022 9:17:33 AM  Radiology DG Chest 2 View  Addendum Date: 09/10/2022   ADDENDUM REPORT: 09/10/2022 08:52 ADDENDUM: Discussed with Lurena Nida PA. 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 Minimally displaced posterior RIGHT eighth rib fracture, not definitely seen on previous study. Electronically Signed   By: Lavonia Dana M.D.   On: 09/10/2022 08:52   Result Date: 09/10/2022 CLINICAL DATA:  Shortness of breath; history non-small cell lung cancer EXAM: CHEST - 2 VIEW COMPARISON:  07/16/2022 FINDINGS: Normal heart size post CABG. Mediastinal contours and pulmonary vascularity normal. Atherosclerotic calcification aorta. Again identified masslike opacity at inferior RIGHT lung, RIGHT lower lobe by a prior CT, 3.0 x 1.1 cm, grossly unchanged. Again identified calcified retrosternal nodule. No acute infiltrate, pleural effusion, or pneumothorax. Bones demineralized. IMPRESSION: Stable appearance of RIGHT lower lobe masslike opacity, corresponding to known treated tumor. No acute abnormalities. Aortic Atherosclerosis (ICD10-I70.0). Electronically Signed: By: Lavonia Dana M.D. On: 09/10/2022 08:20    Procedures Procedures    Medications Ordered in ED Medications   lidocaine (LIDODERM) 5 % 1 patch (1 patch Transdermal Patch Applied 09/10/22 0943)  lidocaine (LIDODERM) 5 % 1 patch (has no administration in time range)  acetaminophen (TYLENOL) tablet 1,000 mg (1,000 mg Oral Given 09/10/22 0837)    ED Course/ Medical Decision Making/ A&P Clinical Course as of 09/10/22 1059  Mon Sep 10, 2022  0947 Occult blood card to lab, stool Provider will collect Negative for blood [JS]    Clinical Course User Index [JS] Chuck Hint, PA-C                             Medical Decision Making Amount and/or Complexity of Data Reviewed Labs: ordered. Decision-making details documented in ED Course. Radiology: ordered.  Risk OTC drugs. Prescription drug management.   LASHANA SPANG 82 y.o. presented today for shortness of breath. Working DDx that I considered at this time includes, but not limited to, COPD exacerbation, PE, ACS, viral illness, costochondritis, pleural effusion, CHF exacerbation.  Review of prior external notes: 07/16/22 ED provider  Unique Tests and My Interpretation:  CMP: Unremarkable similar to previous findings I-STAT Chem-8: Hemoglobin and hematocrit were slightly decreased from previous lab results, the rest of the lab findings are unremarkable Respiratory panel: Unremarkable Troponin: Negative BNP: Unremarkable Chest x-ray: I suspect pathologic fractures in posterior right seventh and eighth ribs judging on the chest x-ray, called radiology to have a relook and they confirmed that the seventh rib fracture has been present since the last chest x-ray but the eighth rib fracture is new and is minimally displaced. EKG: Regular sinus rhythm, normal rate, no blocks noted  Discussion with Independent Historian: None  Discussion of Management of Tests: None  Risk:   Medium:  - prescription drug management  Risk Stratification Score: None  Staffed with Lacretia Leigh, MD  R/o DDx: COPD exacerbation: Chest x-ray was negative and  patient denied any green sputum PE: Chest x-ray makes rib fracture more likely as a source of her pain ACS: Negative EKG and troponins Viral illness: Respiratory panel was negative CHF exacerbation: No lower leg edema and BNP was negative Pleural effusion: Chest x-ray was  negative Costochondritis: Chest x-ray showed posterior rib fractures  Plan: Patient comes in with shortness of breath has been present for 2 weeks.  On exam patient does not exhibit any signs of respiratory distress and vitals are stable at this time.  Labs and imaging will be ordered along with 1000 mg Tylenol as patient has allergies to other pain medications and due to her decreased kidney function ibuprofen is not an option.  Patient has a documented allergy to Vicodin however she states she takes Tylenol at home and tolerates it without any symptoms.  On recheck patient appeared more comfortable after receiving the Tylenol but still states she was in pain.  Vitals at this time heart rate has decreased to 59 and blood pressure has normalized to 129/72 I suspect this is due to resolution of pain as opposed to any cardiac disease.  Patient's hemoglobin has been trending down the past 3 results and is now at 9.2.  Speaking with the patient she revealed that she has had melanotic stools the past 4 days and has a history of GI bleeds and so a fecal occult test will be performed to evaluate for any upper GI bleed that she may be experiencing.  Fecal occult blood test was performed and was negative after I put on the dye and many lab could not process it as a put on the day.  Patient is stable at this time and is reasonable for discharge.  Patient will be given Tessalon to reduce cough and lidocaine patches for symptom relief.  Patient was also encouraged to continue taking Tylenol as needed for pain.  Patient will be encouraged to follow-up with her GI doctor for possible GI bleed and see primary care physician for her rib fracture.   Patient verbalized agreement to this plan.        Final Clinical Impression(s) / ED Diagnoses Final diagnoses:  Closed fracture of multiple ribs of right side, initial encounter    Rx / DC Orders ED Discharge Orders          Ordered    benzonatate (TESSALON) 100 MG capsule  Every 8 hours        09/10/22 1056    lidocaine (LIDODERM) 5 %  Every 24 hours        09/10/22 1057              Elvina Sidle 09/10/22 1102    Lacretia Leigh, MD 09/11/22 1432

## 2022-09-10 NOTE — ED Triage Notes (Signed)
Pt presents with c/o shortness of breath. Pt reports she has lung CA and COPD so she has shortness of breath at baseline. Pt reports that she feels like she has fluid on her lungs because it hurts in her upper back when she moves.

## 2022-09-10 NOTE — Discharge Instructions (Addendum)
Please pick up the Tessalon I prescribed for you to reduce cough and the lidocaine patches for symptom relief.  Please continue taking Tylenol as directed by your primary care physician for pain.  Please follow-up with your primary care regarding recent ER visit and rib fractures.  Please call your GI physician to be evaluated for possible GI bleed.  If symptoms worsen please return to ER.

## 2022-09-10 NOTE — ED Provider Notes (Signed)
I provided a substantive portion of the care of this patient.  I personally performed the entirety of the medical decision making for this encounter.  EKG Interpretation  Date/Time:  Monday September 10 2022 08:17:09 EST Ventricular Rate:  57 PR Interval:  156 QRS Duration: 83 QT Interval:  404 QTC Calculation: 394 R Axis:   55 Text Interpretation: Sinus rhythm Confirmed by Lacretia Leigh ([id number]) on 09/10/2022 9:5:33 AM   82 year old female presents with 2 weeks of pinpoint right-sided rib pain.  No dyspnea.  Hemoptysis.  On exam, she is pinpoint tender at ribs 7 and 8.  Chest x-ray per interpretation shows rib fractures.  No evidence of pneumothorax.  Will treat for pain and discharged home   Lacretia Leigh, MD 09/10/22 [phone]

## 2022-10-11 ENCOUNTER — Telehealth: Payer: Self-pay | Admitting: *Deleted

## 2022-10-11 ENCOUNTER — Telehealth: Payer: Self-pay | Admitting: Pulmonary Disease

## 2022-10-11 NOTE — Telephone Encounter (Signed)
Pt disagree's w/Dr. Quintella Baton it is her lungs that are the cause of her pain I offered appt. She said she has appt already w/Dr. Julien Nordmann on Monday. Also has a call in to Dr. Darcel Bayley.   So bad, she says, she can't even get into the shower she is so short of breath. States she has two broken ribs based on the rads from [facility]. Would like a call back from Triage again.     [phone] is her #

## 2022-10-11 NOTE — Telephone Encounter (Signed)
Returned PC to patient, informed her Dr Julien Nordmann reviewed her chart & most recent CXR, he advises that she contact her pulmonologist Dr Valeta Harms.  Patient verbalizes understanding, states she has already left a message at this office.

## 2022-10-11 NOTE — Telephone Encounter (Signed)
PC received from patient, she is C/O pain in her lungs & extreme SOB, states it is very hard for her to bathe or to walk from her bedroom to the kitchen, states this has been going on x 4-5 weeks.  She states she has been to her PCP, pulmonologist & cardiologist in the last several weeks & "everything was ok."  She was seen in the ED on 09/10/22 & it was determined she has two broken ribs.  She is asking if she needs to be seen by Dr. Julien Nordmann.  Note routed to physician.

## 2022-10-12 NOTE — Telephone Encounter (Signed)
Attempted to call patient back but got a busy tone. Will try again

## 2022-10-12 NOTE — Telephone Encounter (Signed)
Spoke with patient she advises her 3 is good but when she walks or with activity her heart rate drops. Patient also advises when she lays on her rt side it hurts her lungs when she moves. She advises this pain was caused to breaking her ribs. She also advises she has been coughing with white phlegm for the last two weeks. Her pcp gave her tessalon pearls and she advises this has not helped at all. She has also been seen by Dr. Julien Nordmann and he advised her issues are with her lungs and she needs to continue following up with Pulmonary.  Dr. Valeta Harms can you please advise on rt side pain and cough

## 2022-10-19 ENCOUNTER — Telehealth: Payer: Self-pay | Admitting: Internal Medicine

## 2022-10-19 ENCOUNTER — Other Ambulatory Visit: Payer: Self-pay | Admitting: Physician Assistant

## 2022-10-19 ENCOUNTER — Telehealth: Payer: Self-pay

## 2022-10-19 NOTE — Telephone Encounter (Signed)
This nurse received a message from this patient stating that her primary care physician advised her to call this office and inform that she needs an iron infusion.  Patient states that she had labs drawn on 10/15/22 at the doctors office.  This nurse forwarded information to the provider for recommendation.

## 2022-10-19 NOTE — Telephone Encounter (Signed)
Called patient regarding upcoming March appointments, patient is notified. 

## 2022-10-22 ENCOUNTER — Encounter: Payer: Self-pay | Admitting: Physician Assistant

## 2022-10-24 NOTE — Progress Notes (Signed)
[facility] OFFICE PROGRESS NOTE  [name], MD [address] [place] [address]  DIAGNOSIS:  1) stage IA non-small cell lung cancer, squamous cell carcinoma presented with right lower lobe pulmonary nodule. 2) persistent anemia questionable for anemia of chronic disease +/-iron deficiency.  PRIOR THERAPY: 1) stereotactic radiotherapy to the right lower lobe lung nodule under the care of Dr. Tammi Klippel  CURRENT THERAPY:  IV iron as needed with feraheme, last dose on 03/09/22.   INTERVAL HISTORY: [name] 82 y.o. female returns to the clinic today for a follow-up visit accompanied by her daughter.  The patient was last seen in clinic on 05/30/2022. 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 This is a follow up for her anemia today. She saw her PCP recently who performed blood work showing normocytic anemia with a Hbg of 9.2 and low iron studies.   Regarding her anemia, the patient has a history of bleeding AVMs and she follows closely with Dr. Benson Norway.  She was hospitalized in April 2023 with a chief complaint of melena.  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 She reports her stools are not dark at  She then represented to the hospital for possible GI bleed and was admitted from 5/13 to 12/27/2021. The patient had FOBT testing that was positive. She underwent capsule endoscopy which was negative.  She reports her stools are no longer dark.  She knows to monitor for them.  The patient is still on Plavix.  She has some fatigue. She is not taking an oral iron supplement because she is reportedly allergic to it. She reports occasional lightheadedness without syncope. She denies any other abnormal bleeding or bruising including epistaxis, gingival bleeding,  hemoptysis, or hematuria. She reports low energy/fatigue.  She mentions that she has significant allergies and produces a lot of mucus and postnasal drainage and cough.  The patient and her daughter are wondering what the differences between an air purifier and air filter.  She takes an antihistamine daily but tries not to take medicines unless needed.  She had a chest x-ray performed in the emergency room recently that was negative for any acute infection.  Is compliant with her inhalers at home.  She is here today for evaluation and repeat blood work before undergoing iron infusion.   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   Asthma    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   Blood in stool    last week of aug 2018   CAP (community acquired pneumonia) 10/22/2015   Chronic kidney disease    COPD (chronic obstructive pulmonary disease) (HCC)    Coronary artery disease    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   Diverticulitis    Dyspnea    with exertion   Gastroesophageal reflux disease    occ   Glaucoma    right eye   Headache syndrome 11/27/2018   Hiatal hernia    History of shingles 06/23/2018   Hyperlipidemia    Hypertension    Lung cancer ([place]) dx 2018   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   Myocardial infarction ([place]) 01/01/2000   Cardiac catheterization  Peripheral vascular disease ([place])    stents in legs x 2 or 3   Pneumonia    last time winter 2017 -2018   PONV (postoperative nausea and vomiting)    occassionally, last colonscopy did ok with anesthesia   Primary cancer of right lower lobe of lung ([place]) 04/25/2016   Right-sided carotid artery disease ([place]) 01/07/2013   TIA (transient ischemic attack) 02/11/2019   Wears dentures    Full set   Wears glasses     ALLERGIES:  is allergic to avelox [moxifloxacin hcl in  nacl], azithromycin, codeine, doxycycline, hydromorphone, levaquin [levofloxacin], vitamin d analogs, nifedipine er, octreotide, oxycodone-acetaminophen, and risedronate.  MEDICATIONS:  Current Outpatient Medications  Medication Sig Dispense Refill   acetaminophen (TYLENOL) 500 MG tablet Take 500-1,000 mg by mouth every 6 (six) hours as needed (for pain).     albuterol (VENTOLIN HFA) 108 (90 Base) MCG/ACT inhaler Inhale 2 puffs into the lungs every 6 (six) hours as needed for wheezing or shortness of breath. 8 g 6   amoxicillin-clavulanate (AUGMENTIN) 500-125 MG tablet Take 1 tablet (500 mg total) by mouth 2 (two) times daily. 14 tablet 0   atorvastatin (LIPITOR) 40 MG tablet Take 1 tablet (40 mg total) by mouth at bedtime. 30 tablet 6   benzonatate (TESSALON) 100 MG capsule Take 1 capsule (100 mg total) by mouth every 8 (eight) hours. 21 capsule 0   cetirizine (ZYRTEC) 10 MG tablet Take 10 mg by mouth daily as needed for allergies.      clopidogrel (PLAVIX) 75 MG tablet Take 1 tablet (75 mg total) by mouth daily. (Patient taking differently: Take 37.5 mg by mouth in the morning.) 90 tablet 3   fluorouracil (EFUDEX) 5 % cream Apply topically.     Fluticasone-Umeclidin-Vilant (TRELEGY ELLIPTA) 200-62.5-25 MCG/ACT AEPB Inhale 1 puff into the lungs daily. 180 each 3   furosemide (LASIX) 40 MG tablet Take 40 mg by mouth in the morning.     lidocaine (LIDODERM) 5 % Place 1 patch onto the skin daily. Remove & Discard patch within 12 hours or as directed by MD 30 patch 0   Magnesium Oxide 400 MG CAPS Take 400 mg by mouth in the morning.     nitroGLYCERIN (NITROSTAT) 0.4 MG SL tablet Place 1 tablet (0.4 mg total) under the tongue every 5 (five) minutes x 3 doses as needed for chest pain. 10 tablet 0   olmesartan (BENICAR) 20 MG tablet Take 1 tablet (20 mg total) by mouth daily. (Patient taking differently: Take 10 mg by mouth in the morning.) 90 tablet 1   ondansetron (ZOFRAN-ODT) 4 MG disintegrating  tablet Take 4 mg by mouth every 8 (eight) hours as needed for nausea or vomiting (dissolve in the mouth).     pantoprazole (PROTONIX) 40 MG tablet TAKE 1 TABLET BY MOUTH TWICE A DAY 180 tablet 1   No current facility-administered medications for this visit.    SURGICAL HISTORY:  Past Surgical History:  Procedure Laterality Date   ABDOMINAL HYSTERECTOMY     APPENDECTOMY     Arnold-chiari malformation repair  1998   Suboccipital craniectomy   CAROTID ENDARTERECTOMY  03/29/2010   Left  CEA   CHOLECYSTECTOMY     Gall Bladder   COLONOSCOPY WITH PROPOFOL N/A 04/22/2015   Procedure: COLONOSCOPY WITH PROPOFOL;  Surgeon: Carol Ada, MD;  Location: [facility];  Service: Endoscopy;  Laterality: N/A;   COLONOSCOPY WITH PROPOFOL N/A 05/25/2016   Procedure: COLONOSCOPY WITH PROPOFOL;  Surgeon: Carol Ada,  MD;  [facility];  Service: Endoscopy;  Laterality: N/A;   COLONOSCOPY WITH PROPOFOL N/A 05/03/2017   Procedure: COLONOSCOPY WITH PROPOFOL;  Surgeon: Carol Ada, MD;  [facility];  Service: Endoscopy;  Laterality: N/A;   COLONOSCOPY WITH PROPOFOL N/A 06/29/2020   Procedure: COLONOSCOPY WITH PROPOFOL;  Surgeon: Carol Ada, MD;  [facility];  Service: Endoscopy;  Laterality: N/A;   COLONOSCOPY WITH PROPOFOL N/A 01/05/2021   Procedure: COLONOSCOPY WITH PROPOFOL;  Surgeon: Carol Ada, MD;  [facility];  Service: Endoscopy;  Laterality: N/A;   COLONOSCOPY WITH PROPOFOL N/A 09/28/2021   Procedure: COLONOSCOPY WITH PROPOFOL;  Surgeon: Carol Ada, MD;  Location: [facility];  Service: Endoscopy;  Laterality: N/A;   CORNEAL TRANSPLANT     Right   CORONARY ARTERY BYPASS GRAFT  01/01/2000   x 3   ENDARTERECTOMY Right 09/26/2021   Procedure: RIGHT CAROTID ENDARTERECTOMY;  Surgeon: [name], MD;  Location: [facility];  Service: Vascular;  Laterality: Right;   ENTEROSCOPY N/A 02/27/2018   Procedure: ENTEROSCOPY;  Surgeon: Carol Ada, MD;   [facility];  Service: Endoscopy;  Laterality: N/A;   ENTEROSCOPY N/A 07/18/2018   Procedure: ENTEROSCOPY;  Surgeon: Carol Ada, MD;  [facility];  Service: Endoscopy;  Laterality: N/A;   ENTEROSCOPY N/A 06/29/2020   Procedure: ENTEROSCOPY;  Surgeon: Carol Ada, MD;  [facility];  Service: Endoscopy;  Laterality: N/A;   ENTEROSCOPY N/A 12/05/2021   Procedure: ENTEROSCOPY;  Surgeon: Carol Ada, MD;  [facility];  Service: Gastroenterology;  Laterality: N/A;   ENTEROSCOPY N/A 12/25/2021   Procedure: ENTEROSCOPY;  Surgeon: Carol Ada, MD;  [facility];  Service: Gastroenterology;  Laterality: N/A;  with endoscopic placement of video capsule   ESOPHAGOGASTRODUODENOSCOPY N/A 05/25/2016   Procedure: ESOPHAGOGASTRODUODENOSCOPY (EGD);  Surgeon: Carol Ada, MD;  Location: [facility];  Service: Endoscopy;  Laterality: N/A;   ESOPHAGOGASTRODUODENOSCOPY N/A 08/01/2018   Procedure: ESOPHAGOGASTRODUODENOSCOPY (EGD);  Surgeon: Carol Ada, MD;  Location: [facility];  Service: Endoscopy;  Laterality: N/A;   ESOPHAGOGASTRODUODENOSCOPY (EGD) WITH PROPOFOL N/A 09/28/2021   Procedure: ESOPHAGOGASTRODUODENOSCOPY (EGD) WITH PROPOFOL;  Surgeon: Carol Ada, MD;  Location: [facility];  Service: Endoscopy;  Laterality: N/A;   EYE SURGERY Right 1995 or 1996   Laser surgery for retinal hemorrhage   GIVENS CAPSULE STUDY N/A 07/16/2018   Procedure: GIVENS CAPSULE STUDY;  Surgeon: Carol Ada, MD;  [facility];  Service: Endoscopy;  Laterality: N/A;   GIVENS CAPSULE STUDY N/A 12/14/2021   Procedure: GIVENS CAPSULE STUDY;  Surgeon: Carol Ada, MD;  Location: [facility];  Service: Gastroenterology;  Laterality: N/A;   GIVENS CAPSULE STUDY N/A 12/25/2021   Procedure: GIVENS CAPSULE STUDY;  Surgeon: Carol Ada, MD;  [facility];  Service: Gastroenterology;  Laterality: N/A;   HEMOSTASIS CLIP PLACEMENT  06/29/2020    Procedure: HEMOSTASIS CLIP PLACEMENT;  Surgeon: Carol Ada, MD;  [facility];  Service: Endoscopy;;   HEMOSTASIS CLIP PLACEMENT  01/05/2021   Procedure: HEMOSTASIS CLIP PLACEMENT;  Surgeon: Carol Ada, MD;  [facility];  Service: Endoscopy;;   HOT HEMOSTASIS N/A 02/27/2018   Procedure: HOT HEMOSTASIS (ARGON PLASMA COAGULATION/BICAP);  Surgeon: Carol Ada, MD;  Location: [facility];  Service: Endoscopy;  Laterality: N/A;   HOT HEMOSTASIS N/A 08/01/2018   Procedure: HOT HEMOSTASIS (ARGON PLASMA COAGULATION/BICAP);  Surgeon: Carol Ada, MD;  Location: [facility];  Service: Endoscopy;  Laterality: N/A;   HOT HEMOSTASIS N/A 06/29/2020   Procedure: HOT HEMOSTASIS (ARGON PLASMA  COAGULATION/BICAP);  Surgeon: Carol Ada, MD;  Location: [facility];  Service: Endoscopy;  Laterality: N/A;   HOT HEMOSTASIS N/A 01/05/2021   Procedure: HOT HEMOSTASIS (ARGON PLASMA COAGULATION/BICAP);  Surgeon: Carol Ada, MD;  Location: [facility];  Service: Endoscopy;  Laterality: N/A;   HOT HEMOSTASIS N/A 12/05/2021   Procedure: HOT HEMOSTASIS (ARGON PLASMA COAGULATION/BICAP);  Surgeon: Carol Ada, MD;  Location: [facility];  Service: Gastroenterology;  Laterality: N/A;   IR RADIOLOGIST EVAL & MGMT  12/14/2016   IR RADIOLOGIST EVAL & MGMT  07/26/2021   LEFT HEART CATH AND CORS/GRAFTS ANGIOGRAPHY N/A 01/09/2018   Procedure: LEFT HEART CATH AND CORS/GRAFTS ANGIOGRAPHY;  Surgeon: Charolette Forward, MD;  Location: [facility];  Service: Cardiovascular;  Laterality: N/A;   LEFT HEART CATHETERIZATION WITH CORONARY ANGIOGRAM N/A 08/03/2014   Procedure: LEFT HEART CATHETERIZATION WITH CORONARY ANGIOGRAM;  Surgeon: Birdie Riddle, MD;  Location: [facility];  Service: Cardiovascular;  Laterality: N/A;   PATCH ANGIOPLASTY Right 09/26/2021   Procedure: PATCH ANGIOPLASTY RIGHT CAROTID;  Surgeon: [name], MD;  Location: [facility];  Service: Vascular;  Laterality: Right;   Post  Coronary Artery  BPG  01/05/2000   Right jugular sheath removed   PR VEIN BYPASS GRAFT,AORTO-FEM-POP     ROTATOR CUFF REPAIR     Right    REVIEW OF SYSTEMS:   Review of Systems  Constitutional: Positive for fatigue.  Negative for appetite change, chills, fever and unexpected weight change.  HENT: Positive for occasional nasal congestion.  Negative for mouth sores, nosebleeds, sore throat and trouble swallowing.   Eyes: Negative for eye problems and icterus.  Respiratory: Positive for cough.  Negative for hemoptysis, shortness of breath and wheezing.   Cardiovascular: Negative for chest pain and leg swelling.  Gastrointestinal: Negative for abdominal pain, constipation, diarrhea, nausea and vomiting.  Genitourinary: Negative for bladder incontinence, difficulty urinating, dysuria, frequency and hematuria.   Musculoskeletal: Negative for back pain, gait problem, neck pain and neck stiffness.  Skin: Negative for itching and rash.  Neurological: Negative for dizziness, extremity weakness, gait problem, headaches, light-headedness and seizures.  Hematological: Negative for adenopathy. Does not bruise/bleed easily.  Psychiatric/Behavioral: Negative for confusion, depression and sleep disturbance. The patient is not nervous/anxious.     PHYSICAL EXAMINATION:  Blood pressure (!) 156/50, pulse 69, temperature 98 F (36.7 C), temperature source Oral, resp. rate 15, weight 149 lb 1.6 oz (67.6 kg), SpO2 100 %.  ECOG PERFORMANCE STATUS: 1  Physical Exam  Constitutional: Oriented to person, place, and time and well-developed, well-nourished, and in no distress.  HENT:  Head: Normocephalic and atraumatic.  Mouth/Throat: Oropharynx is clear and moist. No oropharyngeal exudate.  Eyes: Conjunctivae are normal. Right eye exhibits no discharge. Left eye exhibits no discharge. No scleral icterus.  Neck: Normal range of motion. Neck supple.  Cardiovascular: Normal rate, regular rhythm, normal heart  sounds and intact distal pulses.   Pulmonary/Chest: Effort normal and breath sounds normal. No respiratory distress. No wheezes. No rales.  Abdominal: Soft. Bowel sounds are normal. Exhibits no distension and no mass. There is no tenderness.  Musculoskeletal: Normal range of motion. Exhibits no edema.  Lymphadenopathy:    No cervical adenopathy.  Neurological: Alert and oriented to person, place, and time. Exhibits normal muscle tone. Gait normal. Coordination normal.  Skin: Skin is warm and dry. No rash noted. Not diaphoretic. No erythema. No pallor.  Psychiatric: Mood, memory and judgment normal.  Vitals reviewed.  LABORATORY DATA: Lab Results  Component  Value Date   WBC 7.0 10/26/2022   HGB 9.1 (L) 10/26/2022   HCT 29.4 (L) 10/26/2022   MCV 84.2 10/26/2022   PLT 361 10/26/2022      Chemistry      Component Value Date/Time   NA 138 09/10/2022 0854   NA 139 08/02/2017 0821   K 4.5 09/10/2022 0854   K 4.3 08/02/2017 0821   CL 107 09/10/2022 0854   CO2 20 (L) 09/10/2022 0825   CO2 24 08/02/2017 0821   BUN 33 (H) 09/10/2022 0854   BUN 14.4 08/02/2017 0821   CREATININE 1.80 (H) 09/10/2022 0854   CREATININE 1.69 (H) 05/30/2022 0943   CREATININE 1.3 (H) 08/02/2017 0821      Component Value Date/Time   CALCIUM 9.0 09/10/2022 0825   CALCIUM 9.2 08/02/2017 0821   ALKPHOS 60 09/10/2022 0825   ALKPHOS 66 08/02/2017 0821   AST 20 09/10/2022 0825   AST 14 (L) 05/30/2022 0943   AST 14 08/02/2017 0821   ALT 10 09/10/2022 0825   ALT 6 05/30/2022 0943   ALT 8 08/02/2017 0821   BILITOT 0.5 09/10/2022 0825   BILITOT 0.3 05/30/2022 0943   BILITOT 0.34 08/02/2017 0821       RADIOGRAPHIC STUDIES:  No results found.   ASSESSMENT/PLAN:  This is a very pleasant 82 year old Caucasian female diagnosed with stage Ia non-small cell lung cancer, squamous cell carcinoma.  She initially presented with a right lower lobe pulmonary nodule in 2017. She is also followed for IDA. She is here  today for her IDA.     she had repeat CBC at her PCPs office last week showing Hbg of 9.2. Her PCP recommeded IV iron infusion. She does not take oral iron supplements due to intolerance. She is scheduled for Venofer 300 mg weekly x3 starting today.    Labs were reviewed.  Hemoglobin stable at 9.1.  I have obtained new baseline iron studies today which I anticipate to be low.  The patient was also given a handout of iron rich food and encouraged to increase her dietary intake of her iron rich food as long as she is following dietary advice for her other providers (i.e. avoiding red meat due to cardiac conditions).   We will see her back for her IDA in 3 months. She is due for lab and CT scan to follow on her history of stage IA lung cancer in October 2024.  She reports allergies with associated postnasal drainage and nasal congestion.  We discussed air filters and air purifier's.  If her nasal congestion also mention she can take Flonase in addition to her oral antihistamine.  She may also want to consider Mucinex.   The patient was advised to call immediately if she has any concerning symptoms in the interval. The patient voices understanding of current disease status and treatment options and is in agreement with the current care plan. All questions were answered. The patient knows to call the clinic with any problems, questions or concerns. We can certainly see the patient much sooner if necessary    Orders Placed This Encounter  Procedures   CBC with Differential ([facility] Only)    Standing Status:   Future    Standing Expiration Date:   10/26/2023   Ferritin    Standing Status:   Future    Standing Expiration Date:   10/26/2023   Iron and Iron Binding Capacity ([facility] only)    Standing Status:   Future  Standing Expiration Date:   10/26/2023   Sample to Blood Bank    Standing Status:   Future    Standing Expiration Date:   10/26/2023     The total time spent in the  appointment was 20-29 minutes.  Regla Fitzgibbon L Jinelle Butchko, PA-C 10/26/22

## 2022-10-25 ENCOUNTER — Other Ambulatory Visit: Payer: Self-pay | Admitting: Physician Assistant

## 2022-10-25 DIAGNOSIS — D500 Iron deficiency anemia secondary to blood loss (chronic): Secondary | ICD-10-CM

## 2022-10-26 ENCOUNTER — Inpatient Hospital Stay (HOSPITAL_BASED_OUTPATIENT_CLINIC_OR_DEPARTMENT_OTHER): Payer: 59 | Admitting: Physician Assistant

## 2022-10-26 ENCOUNTER — Inpatient Hospital Stay: Payer: 59

## 2022-10-26 ENCOUNTER — Inpatient Hospital Stay: Payer: 59 | Attending: Physician Assistant

## 2022-10-26 VITALS — BP 156/50 | HR 69 | Temp 98.0°F | Resp 15 | Wt 149.1 lb

## 2022-10-26 VITALS — BP 180/66 | HR 63 | Resp 16

## 2022-10-26 DIAGNOSIS — D649 Anemia, unspecified: Secondary | ICD-10-CM | POA: Insufficient documentation

## 2022-10-26 DIAGNOSIS — I129 Hypertensive chronic kidney disease with stage 1 through stage 4 chronic kidney disease, or unspecified chronic kidney disease: Secondary | ICD-10-CM | POA: Diagnosis not present

## 2022-10-26 DIAGNOSIS — D509 Iron deficiency anemia, unspecified: Secondary | ICD-10-CM

## 2022-10-26 DIAGNOSIS — C3431 Malignant neoplasm of lower lobe, right bronchus or lung: Secondary | ICD-10-CM | POA: Diagnosis present

## 2022-10-26 DIAGNOSIS — N189 Chronic kidney disease, unspecified: Secondary | ICD-10-CM | POA: Insufficient documentation

## 2022-10-26 DIAGNOSIS — D500 Iron deficiency anemia secondary to blood loss (chronic): Secondary | ICD-10-CM

## 2022-10-26 LAB — CBC WITH DIFFERENTIAL (CANCER CENTER ONLY)
Abs Immature Granulocytes: 0.01 10*3/uL (ref 0.00–0.07)
Basophils Absolute: 0 10*3/uL (ref 0.0–0.1)
Basophils Relative: 1 %
Eosinophils Absolute: 0.4 10*3/uL (ref 0.0–0.5)
Eosinophils Relative: 6 %
HCT: 29.4 % — ABNORMAL LOW (ref 36.0–46.0)
Hemoglobin: 9.1 g/dL — ABNORMAL LOW (ref 12.0–15.0)
Immature Granulocytes: 0 %
Lymphocytes Relative: 35 %
Lymphs Abs: 2.4 10*3/uL (ref 0.7–4.0)
MCH: 26.1 pg (ref 26.0–34.0)
MCHC: 31 g/dL (ref 30.0–36.0)
MCV: 84.2 fL (ref 80.0–100.0)
Monocytes Absolute: 0.5 10*3/uL (ref 0.1–1.0)
Monocytes Relative: 8 %
Neutro Abs: 3.6 10*3/uL (ref 1.7–7.7)
Neutrophils Relative %: 50 %
Platelet Count: 361 10*3/uL (ref 150–400)
RBC: 3.49 MIL/uL — ABNORMAL LOW (ref 3.87–5.11)
RDW: 14.6 % (ref 11.5–15.5)
WBC Count: 7 10*3/uL (ref 4.0–10.5)
nRBC: 0 % (ref 0.0–0.2)

## 2022-10-26 LAB — SAMPLE TO BLOOD BANK

## 2022-10-26 LAB — IRON AND IRON BINDING CAPACITY (CC-WL,HP ONLY)
Iron: 20 ug/dL — ABNORMAL LOW (ref 28–170)
Saturation Ratios: 5 % — ABNORMAL LOW (ref 10.4–31.8)
TIBC: 412 ug/dL (ref 250–450)
UIBC: 392 ug/dL (ref 148–442)

## 2022-10-26 LAB — FERRITIN: Ferritin: 6 ng/mL — ABNORMAL LOW (ref 11–307)

## 2022-10-26 MED ORDER — ACETAMINOPHEN 325 MG PO TABS
650.0000 mg | ORAL_TABLET | Freq: Once | ORAL | Status: AC
  Administered 2022-10-26: 650 mg via ORAL
  Filled 2022-10-26: qty 2

## 2022-10-26 MED ORDER — LORATADINE 10 MG PO TABS
10.0000 mg | ORAL_TABLET | Freq: Once | ORAL | Status: AC
  Administered 2022-10-26: 10 mg via ORAL
  Filled 2022-10-26: qty 1

## 2022-10-26 MED ORDER — SODIUM CHLORIDE 0.9 % IV SOLN: Freq: Once | INTRAVENOUS | Status: AC

## 2022-10-26 MED ORDER — SODIUM CHLORIDE 0.9 % IV SOLN
300.0000 mg | Freq: Once | INTRAVENOUS | Status: AC
  Administered 2022-10-26: 300 mg via INTRAVENOUS
  Filled 2022-10-26: qty 300

## 2022-10-26 NOTE — Patient Instructions (Signed)

## 2022-10-26 NOTE — Telephone Encounter (Signed)
I called and spoke with the pt and went over Dr Juline Patch recommendations with her  She verbalized understanding  She states breathing has improved some after receiving iron infusions  Will call back for ov if needed

## 2022-10-26 NOTE — Progress Notes (Signed)
Patient seen by PA today Pt is ready for treatment.

## 2022-11-02 ENCOUNTER — Inpatient Hospital Stay: Payer: 59

## 2022-11-02 VITALS — BP 164/58 | HR 62 | Temp 97.7°F | Resp 18

## 2022-11-02 DIAGNOSIS — D649 Anemia, unspecified: Secondary | ICD-10-CM

## 2022-11-02 MED ORDER — ACETAMINOPHEN 325 MG PO TABS
650.0000 mg | ORAL_TABLET | Freq: Once | ORAL | Status: AC
  Administered 2022-11-02: 650 mg via ORAL
  Filled 2022-11-02: qty 2

## 2022-11-02 MED ORDER — SODIUM CHLORIDE 0.9 % IV SOLN: Freq: Once | INTRAVENOUS | Status: AC

## 2022-11-02 MED ORDER — LORATADINE 10 MG PO TABS
10.0000 mg | ORAL_TABLET | Freq: Once | ORAL | Status: AC
  Administered 2022-11-02: 10 mg via ORAL
  Filled 2022-11-02: qty 1

## 2022-11-02 MED ORDER — SODIUM CHLORIDE 0.9 % IV SOLN
300.0000 mg | Freq: Once | INTRAVENOUS | Status: AC
  Administered 2022-11-02: 300 mg via INTRAVENOUS
  Filled 2022-11-02: qty 300

## 2022-11-02 NOTE — Patient Instructions (Signed)

## 2022-11-09 ENCOUNTER — Inpatient Hospital Stay: Payer: 59

## 2022-11-09 VITALS — BP 142/64 | HR 54 | Temp 97.6°F | Resp 17

## 2022-11-09 DIAGNOSIS — D649 Anemia, unspecified: Secondary | ICD-10-CM | POA: Diagnosis not present

## 2022-11-09 MED ORDER — ACETAMINOPHEN 325 MG PO TABS
650.0000 mg | ORAL_TABLET | Freq: Once | ORAL | Status: AC
  Administered 2022-11-09: 650 mg via ORAL
  Filled 2022-11-09: qty 2

## 2022-11-09 MED ORDER — LORATADINE 10 MG PO TABS
10.0000 mg | ORAL_TABLET | Freq: Once | ORAL | Status: AC
  Administered 2022-11-09: 10 mg via ORAL
  Filled 2022-11-09: qty 1

## 2022-11-09 MED ORDER — SODIUM CHLORIDE 0.9 % IV SOLN: Freq: Once | INTRAVENOUS | Status: AC

## 2022-11-09 MED ORDER — SODIUM CHLORIDE 0.9 % IV SOLN
300.0000 mg | Freq: Once | INTRAVENOUS | Status: AC
  Administered 2022-11-09: 300 mg via INTRAVENOUS
  Filled 2022-11-09: qty 300

## 2022-11-13 ENCOUNTER — Telehealth: Payer: Self-pay | Admitting: Pulmonary Disease

## 2022-11-13 NOTE — Telephone Encounter (Signed)
I am calling PT to sched FU and PFT's.   This PT has a PFT order in place when they came in as an acute PT.   They ret for a FU and the PFT was not schd.   Does Dr. Maida Sale want a PFT performed at the next FU appt. Please advise. Thanks- T

## 2022-11-14 NOTE — Telephone Encounter (Signed)
Scheduled pt for PFT and 6 month f/u. Nothing further needed.

## 2022-11-14 NOTE — Telephone Encounter (Signed)
Order for PFT was placed back in December. Patient was never scheduled. Does she need to still have a PFT done? Dr. Valeta Harms can you please advise

## 2022-12-11 ENCOUNTER — Encounter (HOSPITAL_COMMUNITY): Payer: Self-pay

## 2022-12-11 ENCOUNTER — Emergency Department (HOSPITAL_COMMUNITY): Payer: 59

## 2022-12-11 ENCOUNTER — Other Ambulatory Visit: Payer: Self-pay

## 2022-12-11 ENCOUNTER — Emergency Department (HOSPITAL_COMMUNITY)
Admission: EM | Admit: 2022-12-11 | Discharge: 2022-12-11 | Disposition: A | Discharge status: Home / Self Care | Payer: 59 | Attending: Emergency Medicine | Admitting: Emergency Medicine

## 2022-12-11 DIAGNOSIS — Z7902 Long term (current) use of antithrombotics/antiplatelets: Secondary | ICD-10-CM | POA: Insufficient documentation

## 2022-12-11 DIAGNOSIS — Z8673 Personal history of transient ischemic attack (TIA), and cerebral infarction without residual deficits: Secondary | ICD-10-CM | POA: Insufficient documentation

## 2022-12-11 DIAGNOSIS — Z85118 Personal history of other malignant neoplasm of bronchus and lung: Secondary | ICD-10-CM | POA: Diagnosis not present

## 2022-12-11 DIAGNOSIS — I671 Cerebral aneurysm, nonruptured: Secondary | ICD-10-CM | POA: Insufficient documentation

## 2022-12-11 DIAGNOSIS — R202 Paresthesia of skin: Secondary | ICD-10-CM | POA: Insufficient documentation

## 2022-12-11 DIAGNOSIS — R200 Anesthesia of skin: Secondary | ICD-10-CM | POA: Diagnosis present

## 2022-12-11 LAB — BASIC METABOLIC PANEL
Anion gap: 10 (ref 5–15)
BUN: 39 mg/dL — ABNORMAL HIGH (ref 8–23)
CO2: 24 mmol/L (ref 22–32)
Calcium: 9.1 mg/dL (ref 8.9–10.3)
Chloride: 101 mmol/L (ref 98–111)
Creatinine, Ser: 1.76 mg/dL — ABNORMAL HIGH (ref 0.44–1.00)
GFR, Estimated: 29 mL/min — ABNORMAL LOW (ref >60)
Glucose, Bld: 106 mg/dL — ABNORMAL HIGH (ref 70–99)
Potassium: 4.3 mmol/L (ref 3.5–5.1)
Sodium: 135 mmol/L (ref 135–145)

## 2022-12-11 LAB — CBC WITH DIFFERENTIAL/PLATELET
Abs Immature Granulocytes: 0.02 10*3/uL (ref 0.00–0.07)
Basophils Absolute: 0.1 10*3/uL (ref 0.0–0.1)
Basophils Relative: 1 %
Eosinophils Absolute: 0.4 10*3/uL (ref 0.0–0.5)
Eosinophils Relative: 4 %
HCT: 32.9 % — ABNORMAL LOW (ref 36.0–46.0)
Hemoglobin: 10.3 g/dL — ABNORMAL LOW (ref 12.0–15.0)
Immature Granulocytes: 0 %
Lymphocytes Relative: 18 %
Lymphs Abs: 1.8 10*3/uL (ref 0.7–4.0)
MCH: 29 pg (ref 26.0–34.0)
MCHC: 31.3 g/dL (ref 30.0–36.0)
MCV: 92.7 fL (ref 80.0–100.0)
Monocytes Absolute: 0.7 10*3/uL (ref 0.1–1.0)
Monocytes Relative: 7 %
Neutro Abs: 7.5 10*3/uL (ref 1.7–7.7)
Neutrophils Relative %: 70 %
Platelets: 344 10*3/uL (ref 150–400)
RBC: 3.55 MIL/uL — ABNORMAL LOW (ref 3.87–5.11)
RDW: 19.3 % — ABNORMAL HIGH (ref 11.5–15.5)
WBC: 10.5 10*3/uL (ref 4.0–10.5)
nRBC: 0 % (ref 0.0–0.2)

## 2022-12-11 MED ORDER — SODIUM CHLORIDE 0.9 % IV BOLUS
1000.0000 mL | Freq: Once | INTRAVENOUS | Status: AC
  Administered 2022-12-11: 1000 mL via INTRAVENOUS

## 2022-12-11 MED ORDER — ACETAMINOPHEN 325 MG PO TABS
650.0000 mg | ORAL_TABLET | Freq: Once | ORAL | Status: AC
  Administered 2022-12-11: 650 mg via ORAL
  Filled 2022-12-11: qty 2

## 2022-12-11 NOTE — Discharge Instructions (Addendum)
Seen today for a feeling of numbness on the right side of your face.  Your MRIs and MRI angiogram were normal.  There is no sign of stroke.  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  Follow-up with your doctor, come back to the ER for new or worsening symptoms.

## 2022-12-11 NOTE — ED Notes (Addendum)
error 

## 2022-12-11 NOTE — ED Provider Notes (Signed)
[place] [facility]ERGENCY DEPARTMENT AT [facility] Provider Note   CSN: [id number] Arrival date & time: 12/11/22  1224     History  Chief Complaint  Patient presents with   Numbness    [name] is a 82 y.o. female.  History of anemia, lung cancer, cerebral aneurysm, GI bleeding and TIA.  She presents the ER today complaining of a feeling of "drying" and tightness to the right face and feeling "weird" in the right side of her face.  She states that this started 6 days ago and she had a headache at that time which has since resolved but she has been having this intermittent feeling of tightness on the right side of her face and states it feels numb on the inside of the face.  She states she has normal sensation to touch on that side.  She is legally blind in the right eye and denies any vision changes in the eye, though she does notice some feeling of pulling when she looks upwards to the right eye.  Denies fevers chills, ocular pain, no numbness or tingling in extremities, no extremity weakness, no confusion, headache has not recurred.  With her history of cerebral artery aneurysm she follows up with Dr. Corliss Skains.  His office but was not able to get a response so came into the ED for further evaluation.  Denies any neck stiffness, no chest pain or shortness of breath, no other complaints.  HPI     Home Medications Prior to Admission medications   Medication Sig Start Date End Date Taking? Authorizing Provider  acetaminophen (TYLENOL) 500 MG tablet Take 500-1,000 mg by mouth every 6 (six) hours as needed (for pain).    [provider]  albuterol (VENTOLIN HFA) 108 (90 Base) MCG/ACT inhaler Inhale 2 puffs into the lungs every 6 (six) hours as needed for wheezing or shortness of breath. 05/25/20   Icard, Elige Radon L, DO  amoxicillin-clavulanate (AUGMENTIN) 500-125 MG tablet Take 1 tablet (500 mg total) by mouth 2 (two) times daily. 04/07/22   Achille Rich, PA-C   atorvastatin (LIPITOR) 40 MG tablet Take 1 tablet (40 mg total) by mouth at bedtime. 09/29/21   Rhyne, Ames Coupe, PA-C  benzonatate (TESSALON) 100 MG capsule Take 1 capsule (100 mg total) by mouth every 8 (eight) hours. 09/10/22   Netta Corrigan, PA-C  cetirizine (ZYRTEC) 10 MG tablet Take 10 mg by mouth daily as needed for allergies.     [provider]  clopidogrel (PLAVIX) 75 MG tablet Take 1 tablet (75 mg total) by mouth daily. Patient taking differently: Take 37.5 mg by mouth in the morning. 07/08/20   Almon Hercules, MD  fluorouracil (EFUDEX) 5 % cream Apply topically. 04/25/22   [provider]  Fluticasone-Umeclidin-Vilant (TRELEGY ELLIPTA) 200-62.5-25 MCG/ACT AEPB Inhale 1 puff into the lungs daily. 06/15/21   Josephine Igo, DO  furosemide (LASIX) 40 MG tablet Take 40 mg by mouth in the morning. 01/30/21   [provider]  lidocaine (LIDODERM) 5 % Place 1 patch onto the skin daily. Remove & Discard patch within 12 hours or as directed by MD 09/10/22   Netta Corrigan, PA-C  Magnesium Oxide 400 MG CAPS Take 400 mg by mouth in the morning.    [provider]  nitroGLYCERIN (NITROSTAT) 0.4 MG SL tablet Place 1 tablet (0.4 mg total) under the tongue every 5 (five) minutes x 3 doses as needed for chest pain. 08/01/22   Cobb, Natalia Leatherwood  V, NP  olmesartan (BENICAR) 20 MG tablet Take 1 tablet (20 mg total) by mouth daily. Patient taking differently: Take 10 mg by mouth in the morning. 07/04/20   Almon Hercules, MD  ondansetron (ZOFRAN-ODT) 4 MG disintegrating tablet Take 4 mg by mouth every 8 (eight) hours as needed for nausea or vomiting (dissolve in the mouth). 03/29/21   [provider]  pantoprazole (PROTONIX) 40 MG tablet TAKE 1 TABLET BY MOUTH TWICE A DAY 08/24/22   Cobb, Ruby Cola, NP      Allergies    Avelox [moxifloxacin hcl in nacl], Azithromycin, Codeine, Doxycycline, Hydromorphone, Levaquin [levofloxacin], Vitamin d analogs, Nifedipine er,  Octreotide, Oxycodone-acetaminophen, and Risedronate    Review of Systems   Review of Systems  Physical Exam Updated Vital Signs BP 131/80   Pulse (!) 55   Temp 98.2 F (36.8 C) (Oral)   Resp 16   Ht 5\' 2"  (1.575 m)   Wt 65.3 kg   SpO2 99%   BMI 26.34 kg/m  Physical Exam Vitals and nursing note reviewed.  Constitutional:      General: She is not in acute distress.    Appearance: She is well-developed.  HENT:     Head: Normocephalic and atraumatic.     Mouth/Throat:     Mouth: Mucous membranes are moist.  Eyes:     General: No visual field deficit.    Conjunctiva/sclera: Conjunctivae normal.  Cardiovascular:     Rate and Rhythm: Normal rate and regular rhythm.     Heart sounds: No murmur heard. Pulmonary:     Effort: Pulmonary effort is normal. No respiratory distress.     Breath sounds: Normal breath sounds.  Abdominal:     Palpations: Abdomen is soft.     Tenderness: There is no abdominal tenderness.  Musculoskeletal:        General: No swelling. Normal range of motion.     Cervical back: Neck supple.  Skin:    General: Skin is warm and dry.     Capillary Refill: Capillary refill takes less than 2 seconds.  Neurological:     General: No focal deficit present.     Mental Status: She is alert and oriented to person, place, and time.     GCS: GCS eye subscore is 4. GCS verbal subscore is 5. GCS motor subscore is 6.     Cranial Nerves: No cranial nerve deficit, dysarthria or facial asymmetry.     Motor: No weakness, tremor or abnormal muscle tone.     Coordination: Coordination normal. Finger-Nose-Finger Test normal.     Gait: Gait is intact.     Comments: Speech is clear and appropriate, patient has legal blindness in right eye but vision is at baseline,  Psychiatric:        Mood and Affect: Mood normal.     ED Results / Procedures / Treatments   Labs (all labs ordered are listed, but only abnormal results are displayed) Labs Reviewed  CBC WITH  DIFFERENTIAL/PLATELET - Abnormal; Notable for the following components:      Result Value   RBC 3.55 (*)    Hemoglobin 10.3 (*)    HCT 32.9 (*)    RDW 19.3 (*)    All other components within normal limits  BASIC METABOLIC PANEL - Abnormal; Notable for the following components:   Glucose, Bld 106 (*)    BUN 39 (*)    Creatinine, Ser 1.76 (*)    GFR, Estimated 29 (*)  All other components within normal limits    EKG None  Radiology MR ANGIO HEAD WO CONTRAST  Result Date: 12/11/2022 CLINICAL DATA:  Right facial numbness EXAM: MRI HEAD WITHOUT CONTRAST MRA HEAD WITHOUT CONTRAST TECHNIQUE: Multiplanar, multi-echo pulse sequences of the brain and surrounding structures were acquired without intravenous contrast. Angiographic images of the Circle of Willis were acquired using MRA technique without intravenous contrast. COMPARISON:  07/30/2022 MRA head, 11/18/2021 MRI head FINDINGS: MRI HEAD FINDINGS Brain: No restricted diffusion to suggest acute or subacute infarct. No acute hemorrhage, mass, mass effect, or midline shift. No hydrocephalus or extra-axial collection. Scattered punctate foci of hemosiderin deposition in the bilateral cerebral and cerebellar hemispheres, as well as in the right thalamus and right midbrain, which appear similar to the prior exam. Scattered T2 hyperintense signal in the periventricular white matter, likely the sequela of minimal chronic small vessel ischemic disease. Vascular: Please see MRA findings below. Skull and upper cervical spine: Prior suboccipital craniectomy. Normal marrow signal. Sinuses/Orbits: Mucosal thickening in the right maxillary sinus and ethmoid air cells. Prior right lens replacement. Other: None. MRA HEAD FINDINGS Anterior circulation: Both internal carotid arteries are patent to the termini, without significant stenosis. A1 segments patent. Normal anterior communicating artery. Anterior cerebral arteries are patent to their distal aspects. No M1  stenosis or occlusion. Redemonstrated 2 mm superiorly projecting aneurysm at the origin of the first MCA branch, unchanged. Distal MCA branches perfused and symmetric. Posterior circulation: Vertebral arteries patent to the vertebrobasilar junction without stenosis. Posterior inferior cerebral arteries patent bilaterally. Basilar patent to its distal aspect. Superior cerebellar arteries patent bilaterally. Patent P1 segments. PCAs perfused to their distal aspects without stenosis. The right posterior communicating artery is patent. Anatomic variants: None significant IMPRESSION: 1. No acute intracranial process. No evidence of acute or subacute infarct. 2. No intracranial large vessel occlusion or significant stenosis. 3. Redemonstrated 2 mm superiorly projecting aneurysm at the origin of the first MCA branch, unchanged. Electronically Signed   By: Wiliam Ke M.D.   On: 12/11/2022 19:07   [name] CONTRAST  Result Date: 12/11/2022 CLINICAL DATA:  Right facial numbness EXAM: MRI HEAD WITHOUT CONTRAST MRA HEAD WITHOUT CONTRAST TECHNIQUE: Multiplanar, multi-echo pulse sequences of the brain and surrounding structures were acquired without intravenous contrast. Angiographic images of the Circle of Willis were acquired using MRA technique without intravenous contrast. COMPARISON:  07/30/2022 MRA head, 11/18/2021 MRI head FINDINGS: MRI HEAD FINDINGS Brain: No restricted diffusion to suggest acute or subacute infarct. No acute hemorrhage, mass, mass effect, or midline shift. No hydrocephalus or extra-axial collection. Scattered punctate foci of hemosiderin deposition in the bilateral cerebral and cerebellar hemispheres, as well as in the right thalamus and right midbrain, which appear similar to the prior exam. Scattered T2 hyperintense signal in the periventricular white matter, likely the sequela of minimal chronic small vessel ischemic disease. Vascular: Please see MRA findings below. Skull and upper cervical  spine: Prior suboccipital craniectomy. Normal marrow signal. Sinuses/Orbits: Mucosal thickening in the right maxillary sinus and ethmoid air cells. Prior right lens replacement. Other: None. MRA HEAD FINDINGS Anterior circulation: Both internal carotid arteries are patent to the termini, without significant stenosis. A1 segments patent. Normal anterior communicating artery. Anterior cerebral arteries are patent to their distal aspects. No M1 stenosis or occlusion. Redemonstrated 2 mm superiorly projecting aneurysm at the origin of the first MCA branch, unchanged. Distal MCA branches perfused and symmetric. Posterior circulation: Vertebral arteries patent to the vertebrobasilar junction without stenosis. Posterior inferior cerebral  arteries patent bilaterally. Basilar patent to its distal aspect. Superior cerebellar arteries patent bilaterally. Patent P1 segments. PCAs perfused to their distal aspects without stenosis. The right posterior communicating artery is patent. Anatomic variants: None significant IMPRESSION: 1. No acute intracranial process. No evidence of acute or subacute infarct. 2. No intracranial large vessel occlusion or significant stenosis. 3. Redemonstrated 2 mm superiorly projecting aneurysm at the origin of the first MCA branch, unchanged. Electronically Signed   By: Wiliam Ke M.D.   On: 12/11/2022 19:07   CT Head Wo Contrast  Result Date: 12/11/2022 CLINICAL DATA:  Headache with facial numbness on the right. EXAM: CT HEAD WITHOUT CONTRAST TECHNIQUE: Contiguous axial images were obtained from the base of the skull through the vertex without intravenous contrast. RADIATION DOSE REDUCTION: This exam was performed according to the departmental dose-optimization program which includes automated exposure control, adjustment of the mA and/or kV according to patient size and/or use of iterative reconstruction technique. COMPARISON:  Head CT 11/18/2021 FINDINGS: Brain: No evidence of acute  infarction, hemorrhage, hydrocephalus, extra-axial collection or mass lesion/mass effect. Vascular: No hyperdense vessel or unexpected calcification. Skull: Suboccipital craniectomy.  No acute fractures. Sinuses/Orbits: There is mucosal thickening and scattered opacification of ethmoid air cells similar to prior study. No air-fluid levels are seen. Orbits are within normal limits. Other: None. IMPRESSION: 1. No acute intracranial process. Electronically Signed   By: Darliss Cheney M.D.   On: 12/11/2022 16:26    Procedures Procedures    Medications Ordered in ED Medications  sodium chloride 0.9 % bolus 1,000 mL (0 mLs Intravenous Stopped 12/11/22 1830)  acetaminophen (TYLENOL) tablet 650 mg (650 mg Oral Given 12/11/22 1454)    ED Course/ Medical Decision Making/ A&P                             Medical Decision Making This patient presents to the ED for concern of R face feeling "weird" X 5 day, this involves an extensive number of treatment options, and is a complaint that carries with it a high risk of complications and morbidity.  The differential diagnosis includes TIA, CVA, Cerebral aneurysm, Bells palsy, brain lesion, other   Co morbidities that complicate the patient evaluation  History of cerebral aneurysm and TIA   Additional history obtained:  Additional history obtained from MR External records from outside source obtained and reviewed including prior MRI results, prior ED notes   Lab Tests:  I Ordered, and personally interpreted labs.  The pertinent results include: Hemoglobin is 10.3 which is her baseline, no leukocytosis, BUN and creatinine are at her baseline as well   Imaging Studies ordered:  I ordered imaging studies including CT head, I independently visualized and interpreted imaging which showed no acute intercranial process I agree with the radiologist interpretation MRI and MRA brain without contrast ordered no acute changes, aneurysm is unchanged.  Radiology  report     Problem List / ED Course / Critical interventions / Medication management  Still having a "weird feeling" on the right side of her face for the past 5 days had some intermittent headache.  She has a normal neurologic exam here and has normal sensation to touch but states it feels numb on the inside.  She was concerned because she has a history of an aneurysm.  Unable to do CTA due to renal function but MRI and MRA ordered that showed no changes.  Patient is feeling better, she has  been ambulating in the ED without difficulty advised on outpatient follow-up and return precautions.  Patient also seen by my attending        Amount and/or Complexity of Data Reviewed Labs: ordered. Radiology: ordered.  Risk OTC drugs.           Final Clinical Impression(s) / ED Diagnoses Final diagnoses:  Paresthesia    Rx / DC Orders ED Discharge Orders     None         Josem Kaufmann 12/11/22 2121    Rondel Baton, MD 12/12/22 1001

## 2022-12-11 NOTE — ED Notes (Signed)
Pt has returned from MRI. 

## 2022-12-11 NOTE — ED Notes (Signed)
This RN has called CT to determine status of the CT scan.

## 2022-12-11 NOTE — ED Triage Notes (Signed)
Right sided facial numbness since last Wednesday and states right eye has felt droopy since yesterday. Pt is A&Ox4. Denies arm or leg weakness. Pt takes plavix for prior TIAs.  Hx of lung cancer, not receiving treatment at this time. Multiple known brain aneurysms.

## 2023-01-01 ENCOUNTER — Encounter: Payer: Self-pay | Admitting: Physician Assistant

## 2023-01-24 ENCOUNTER — Other Ambulatory Visit: Payer: Self-pay

## 2023-01-24 ENCOUNTER — Inpatient Hospital Stay (HOSPITAL_BASED_OUTPATIENT_CLINIC_OR_DEPARTMENT_OTHER): Payer: 59 | Admitting: Internal Medicine

## 2023-01-24 ENCOUNTER — Inpatient Hospital Stay: Payer: 59 | Attending: Physician Assistant

## 2023-01-24 VITALS — BP 172/68 | HR 61 | Temp 98.1°F | Resp 16 | Wt 147.0 lb

## 2023-01-24 DIAGNOSIS — K625 Hemorrhage of anus and rectum: Secondary | ICD-10-CM | POA: Diagnosis not present

## 2023-01-24 DIAGNOSIS — R0602 Shortness of breath: Secondary | ICD-10-CM | POA: Insufficient documentation

## 2023-01-24 DIAGNOSIS — D509 Iron deficiency anemia, unspecified: Secondary | ICD-10-CM | POA: Insufficient documentation

## 2023-01-24 DIAGNOSIS — Z90710 Acquired absence of both cervix and uterus: Secondary | ICD-10-CM | POA: Insufficient documentation

## 2023-01-24 DIAGNOSIS — C3431 Malignant neoplasm of lower lobe, right bronchus or lung: Secondary | ICD-10-CM | POA: Diagnosis present

## 2023-01-24 DIAGNOSIS — R059 Cough, unspecified: Secondary | ICD-10-CM | POA: Insufficient documentation

## 2023-01-24 LAB — SAMPLE TO BLOOD BANK

## 2023-01-24 LAB — FERRITIN: Ferritin: 39 ng/mL (ref 11–307)

## 2023-01-24 LAB — CBC WITH DIFFERENTIAL (CANCER CENTER ONLY)
Abs Immature Granulocytes: 0.02 10*3/uL (ref 0.00–0.07)
Basophils Absolute: 0.1 10*3/uL (ref 0.0–0.1)
Basophils Relative: 1 %
Eosinophils Absolute: 0.5 10*3/uL (ref 0.0–0.5)
Eosinophils Relative: 8 %
HCT: 30.3 % — ABNORMAL LOW (ref 36.0–46.0)
Hemoglobin: 9.8 g/dL — ABNORMAL LOW (ref 12.0–15.0)
Immature Granulocytes: 0 %
Lymphocytes Relative: 31 %
Lymphs Abs: 1.9 10*3/uL (ref 0.7–4.0)
MCH: 29.6 pg (ref 26.0–34.0)
MCHC: 32.3 g/dL (ref 30.0–36.0)
MCV: 91.5 fL (ref 80.0–100.0)
Monocytes Absolute: 0.5 10*3/uL (ref 0.1–1.0)
Monocytes Relative: 8 %
Neutro Abs: 3.2 10*3/uL (ref 1.7–7.7)
Neutrophils Relative %: 52 %
Platelet Count: 259 10*3/uL (ref 150–400)
RBC: 3.31 MIL/uL — ABNORMAL LOW (ref 3.87–5.11)
RDW: 15.9 % — ABNORMAL HIGH (ref 11.5–15.5)
WBC Count: 6.1 10*3/uL (ref 4.0–10.5)
nRBC: 0 % (ref 0.0–0.2)

## 2023-01-24 LAB — IRON AND IRON BINDING CAPACITY (CC-WL,HP ONLY)
Iron: 56 ug/dL (ref 28–170)
Saturation Ratios: 17 % (ref 10.4–31.8)
TIBC: 335 ug/dL (ref 250–450)
UIBC: 279 ug/dL (ref 148–442)

## 2023-01-24 NOTE — Progress Notes (Signed)
[facility] [phone]   [phone]  OFFICE PROGRESS NOTE  Tracey Harries, MD [address] [place] [address]  DIAGNOSIS:  1) stage IA non-small cell lung cancer, squamous cell carcinoma presented with right lower lobe pulmonary nodule diagnosed in August 2017. 2) persistent anemia questionable for anemia of chronic disease plus/minus iron deficiency.  PRIOR THERAPY:  1) Feraheme infusion on as-needed basis. 2) stereotactic radiotherapy to the right lower lobe lung nodule under the care of Dr. Kathrynn Running.  CURRENT THERAPY: Observation.  INTERVAL HISTORY: [name] 82 y.o. female returns to the clinic today for follow-up visit accompanied by her daughter.  The patient is feeling fine today with no concerning complaints except for mild fatigue as well as cough productive of thick sputum and shortness of breath with exertion but she has no chest pain or hemoptysis.  She has no nausea, vomiting, diarrhea or constipation.  She has no headache or visual changes.  She denied having any recent weight loss or night sweats.  She is here today for evaluation and repeat blood work for evaluation of her anemia.  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   Diverticulitis    Dyspnea    with exertion   Gastroesophageal reflux disease    occ   Glaucoma    right eye   Headache syndrome 11/27/2018   Hiatal hernia    History of shingles 06/23/2018   Hyperlipidemia    Hypertension    Lung cancer (HCC) dx 2018    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   Myocardial infarction (HCC) 01/01/2000   Cardiac catheterization   Peripheral vascular disease (HCC)    stents in legs x 2 or 3   Pneumonia    last time winter 2017 -2018   PONV (postoperative nausea and vomiting)    occassionally, last colonscopy did ok with anesthesia   Primary cancer of right lower lobe of lung (HCC) 04/25/2016   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  MEDICATIONS:  Current Outpatient Medications  Medication Sig Dispense Refill   acetaminophen (TYLENOL) 500 MG tablet Take 500-1,000 mg by mouth every 6 (six) hours as needed (for pain).     albuterol (VENTOLIN HFA) 108 (90 Base) MCG/ACT inhaler Inhale 2 puffs into the lungs every 6 (six) hours as needed for wheezing or shortness of breath. 8 g 6   amoxicillin-clavulanate (AUGMENTIN) 500-125 MG tablet Take 1 tablet (500 mg total) by mouth 2 (two) times daily. 14 tablet 0   atorvastatin (LIPITOR) 40 MG tablet Take 1 tablet (40 mg total) by mouth at bedtime. 30 tablet 6   benzonatate (TESSALON) 100 MG capsule Take 1 capsule (100 mg total)  by mouth every 8 (eight) hours. 21 capsule 0   cetirizine (ZYRTEC) 10 MG tablet Take 10 mg by mouth daily as needed for allergies.      clopidogrel (PLAVIX) 75 MG tablet Take 1 tablet (75 mg total) by mouth daily. (Patient taking differently: Take 37.5 mg by mouth in the morning.) 90 tablet 3   fluorouracil (EFUDEX) 5 % cream Apply topically.     Fluticasone-Umeclidin-Vilant (TRELEGY ELLIPTA) 200-62.5-25 MCG/ACT AEPB Inhale 1 puff into the lungs daily. 180 each 3   furosemide (LASIX) 40 MG tablet Take 40 mg  by mouth in the morning.     lidocaine (LIDODERM) 5 % Place 1 patch onto the skin daily. Remove & Discard patch within 12 hours or as directed by MD 30 patch 0   Magnesium Oxide 400 MG CAPS Take 400 mg by mouth in the morning.     nitroGLYCERIN (NITROSTAT) 0.4 MG SL tablet Place 1 tablet (0.4 mg total) under the tongue every 5 (five) minutes x 3 doses as needed for chest pain. 10 tablet 0   olmesartan (BENICAR) 20 MG tablet Take 1 tablet (20 mg total) by mouth daily. (Patient taking differently: Take 10 mg by mouth in the morning.) 90 tablet 1   ondansetron (ZOFRAN-ODT) 4 MG disintegrating tablet Take 4 mg by mouth every 8 (eight) hours as needed for nausea or vomiting (dissolve in the mouth).     pantoprazole (PROTONIX) 40 MG tablet TAKE 1 TABLET BY MOUTH TWICE A DAY 180 tablet 1   No current facility-administered medications for this visit.    SURGICAL HISTORY:  Past Surgical History:  Procedure Laterality Date   ABDOMINAL HYSTERECTOMY     APPENDECTOMY     Arnold-chiari malformation repair  1998   Suboccipital craniectomy   CAROTID ENDARTERECTOMY  03/29/2010   Left  CEA   CHOLECYSTECTOMY     Gall Bladder   COLONOSCOPY WITH PROPOFOL N/A 04/22/2015   Procedure: COLONOSCOPY WITH PROPOFOL;  Surgeon: Jeani Hawking, MD;  Location: [facility];  Service: Endoscopy;  Laterality: N/A;   COLONOSCOPY WITH PROPOFOL N/A 05/25/2016   Procedure: COLONOSCOPY WITH PROPOFOL;  Surgeon: Jeani Hawking, MD;  Location: [facility];  Service: Endoscopy;  Laterality: N/A;   COLONOSCOPY WITH PROPOFOL N/A 05/03/2017   Procedure: COLONOSCOPY WITH PROPOFOL;  Surgeon: Jeani Hawking, MD;  Location: [facility];  Service: Endoscopy;  Laterality: N/A;   COLONOSCOPY WITH PROPOFOL N/A 06/29/2020   Procedure: COLONOSCOPY WITH PROPOFOL;  Surgeon: Jeani Hawking, MD;  Location: [facility];  Service: Endoscopy;  Laterality: N/A;   COLONOSCOPY WITH PROPOFOL N/A 01/05/2021   Procedure: COLONOSCOPY WITH PROPOFOL;  Surgeon:  Jeani Hawking, MD;  Location: [facility];  Service: Endoscopy;  Laterality: N/A;   COLONOSCOPY WITH PROPOFOL N/A 09/28/2021   Procedure: COLONOSCOPY WITH PROPOFOL;  Surgeon: Jeani Hawking, MD;  Location: [facility] ENDOSCOPY;  Service: Endoscopy;  Laterality: N/A;   CORNEAL TRANSPLANT     Right   CORONARY ARTERY BYPASS GRAFT  01/01/2000   x 3   ENDARTERECTOMY Right 09/26/2021   Procedure: RIGHT CAROTID ENDARTERECTOMY;  Surgeon: Chuck Hint, MD;  Location: [facility] OR;  Service: Vascular;  Laterality: Right;   ENTEROSCOPY N/A 02/27/2018   Procedure: ENTEROSCOPY;  Surgeon: Jeani Hawking, MD;  Location: [facility];  Service: Endoscopy;  Laterality: N/A;   ENTEROSCOPY N/A 07/18/2018   Procedure: ENTEROSCOPY;  Surgeon: Jeani Hawking, MD;  Location: [facility];  Service: Endoscopy;  Laterality: N/A;   ENTEROSCOPY N/A 06/29/2020   Procedure: ENTEROSCOPY;  Surgeon:  Jeani Hawking, MD;  Location: [facility] ENDOSCOPY;  Service: Endoscopy;  Laterality: N/A;   ENTEROSCOPY N/A 12/05/2021   Procedure: ENTEROSCOPY;  Surgeon: Jeani Hawking, MD;  Location: [facility];  Service: Gastroenterology;  Laterality: N/A;   ENTEROSCOPY N/A 12/25/2021   Procedure: ENTEROSCOPY;  Surgeon: Jeani Hawking, MD;  Location: [facility];  Service: Gastroenterology;  Laterality: N/A;  with endoscopic placement of video capsule   ESOPHAGOGASTRODUODENOSCOPY N/A 05/25/2016   Procedure: ESOPHAGOGASTRODUODENOSCOPY (EGD);  Surgeon: Jeani Hawking, MD;  Location: [facility] ENDOSCOPY;  Service: Endoscopy;  Laterality: N/A;   ESOPHAGOGASTRODUODENOSCOPY N/A 08/01/2018   Procedure: ESOPHAGOGASTRODUODENOSCOPY (EGD);  Surgeon: Jeani Hawking, MD;  Location: [facility];  Service: Endoscopy;  Laterality: N/A;   ESOPHAGOGASTRODUODENOSCOPY (EGD) WITH PROPOFOL N/A 09/28/2021   Procedure: ESOPHAGOGASTRODUODENOSCOPY (EGD) WITH PROPOFOL;  Surgeon: Jeani Hawking, MD;  Location: [facility] ENDOSCOPY;  Service: Endoscopy;  Laterality: N/A;   EYE SURGERY Right 1995 or 1996    Laser surgery for retinal hemorrhage   GIVENS CAPSULE STUDY N/A 07/16/2018   Procedure: GIVENS CAPSULE STUDY;  Surgeon: Jeani Hawking, MD;  Location: [facility];  Service: Endoscopy;  Laterality: N/A;   GIVENS CAPSULE STUDY N/A 12/14/2021   Procedure: GIVENS CAPSULE STUDY;  Surgeon: Jeani Hawking, MD;  Location: [facility] ENDOSCOPY;  Service: Gastroenterology;  Laterality: N/A;   GIVENS CAPSULE STUDY N/A 12/25/2021   Procedure: GIVENS CAPSULE STUDY;  Surgeon: Jeani Hawking, MD;  Location: [facility];  Service: Gastroenterology;  Laterality: N/A;   HEMOSTASIS CLIP PLACEMENT  06/29/2020   Procedure: HEMOSTASIS CLIP PLACEMENT;  Surgeon: Jeani Hawking, MD;  Location: [facility];  Service: Endoscopy;;   HEMOSTASIS CLIP PLACEMENT  01/05/2021   Procedure: HEMOSTASIS CLIP PLACEMENT;  Surgeon: Jeani Hawking, MD;  Location: [facility];  Service: Endoscopy;;   HOT HEMOSTASIS N/A 02/27/2018   Procedure: HOT HEMOSTASIS (ARGON PLASMA COAGULATION/BICAP);  Surgeon: Jeani Hawking, MD;  Location: [facility];  Service: Endoscopy;  Laterality: N/A;   HOT HEMOSTASIS N/A 08/01/2018   Procedure: HOT HEMOSTASIS (ARGON PLASMA COAGULATION/BICAP);  Surgeon: Jeani Hawking, MD;  Location: [facility];  Service: Endoscopy;  Laterality: N/A;   HOT HEMOSTASIS N/A 06/29/2020   Procedure: HOT HEMOSTASIS (ARGON PLASMA COAGULATION/BICAP);  Surgeon: Jeani Hawking, MD;  Location: [facility] END[facility];  Service: Endoscopy;  Laterality: N/A;   HOT HEMOSTASIS N/A 01/05/2021   Procedure: HOT HEMOSTASIS (ARGON PLASMA COAGULATION/BICAP);  Surgeon: Jeani Hawking, MD;  Location: [facility];  Service: Endoscopy;  Laterality: N/A;   HOT HEMOSTASIS N/A 12/05/2021   Procedure: HOT HEMOSTASIS (ARGON PLASMA COAGULATION/BICAP);  Surgeon: Jeani Hawking, MD;  Location: [facility] END[facility];  Service: Gastroenterology;  Laterality: N/A;   IR RADIOLOGIST EVAL & MGMT  12/14/2016   IR RADIOLOGIST EVAL & MGMT  07/26/2021   LEFT HEART CATH AND CORS/GRAFTS ANGIOGRAPHY N/A  01/09/2018   Procedure: LEFT HEART CATH AND CORS/GRAFTS ANGIOGRAPHY;  Surgeon: Rinaldo Cloud, MD;  Location: [facility]VAS[facility] CV LAB;  Service: Cardiovascular;  Laterality: N/A;   LEFT HEART CATHETERIZATION WITH CORONARY ANGIOGRAM N/A 08/03/2014   Procedure: LEFT HEART CATHETERIZATION WITH CORONARY ANGIOGRAM;  Surgeon: Ricki Rodriguez, MD;  Location: [facility] CATH LAB;  Service: Cardiovascular;  Laterality: N/A;   PATCH ANGIOPLASTY Right 09/26/2021   Procedure: PATCH ANGIOPLASTY RIGHT CAROTID;  Surgeon: Chuck Hint, MD;  Location: [facility] OR;  Service: Vascular;  Laterality: Right;   Post Coronary Artery  BPG  01/05/2000   Right jugular sheath removed   PR VEIN BYPASS GRAFT,AORTO-FEM-POP     ROTATOR CUFF REPAIR     Right    REVIEW  OF SYSTEMS:  A comprehensive review of systems was negative except for: Constitutional: positive for fatigue Respiratory: positive for cough and dyspnea on exertion   PHYSICAL EXAMINATION: General appearance: alert, cooperative, fatigued, and no distress Head: Normocephalic, without obvious abnormality, atraumatic Neck: no adenopathy, no JVD, supple, symmetrical, trachea midline, and thyroid not enlarged, symmetric, no tenderness/mass/nodules Lymph nodes: Cervical, supraclavicular, and axillary nodes normal. Resp: clear to auscultation bilaterally Back: symmetric, no curvature. ROM normal. No CVA tenderness. Cardio: regular rate and rhythm, S1, S2 normal, no murmur, click, rub or gallop GI: soft, non-tender; bowel sounds normal; no masses,  no organomegaly Extremities: extremities normal, atraumatic, no cyanosis or edema  ECOG PERFORMANCE STATUS: 1 - Symptomatic but completely ambulatory  Blood pressure (!) 172/68, pulse 61, temperature 98.1 F (36.7 C), temperature source Oral, resp. rate 16, weight 147 lb (66.7 kg), SpO2 100 %.  LABORATORY DATA: Lab Results  Component Value Date   WBC 6.1 01/24/2023   HGB 9.8 (L) 01/24/2023   HCT 30.3 (L) 01/24/2023   MCV  91.5 01/24/2023   PLT 259 01/24/2023      Chemistry      Component Value Date/Time   NA 135 12/11/2022 1328   NA 139 08/02/2017 0821   K 4.3 12/11/2022 1328   K 4.3 08/02/2017 0821   CL 101 12/11/2022 1328   CO2 24 12/11/2022 1328   CO2 24 08/02/2017 0821   BUN 39 (H) 12/11/2022 1328   BUN 14.4 08/02/2017 0821   CREATININE 1.76 (H) 12/11/2022 1328   CREATININE 1.69 (H) 05/30/2022 0943   CREATININE 1.3 (H) 08/02/2017 0821      Component Value Date/Time   CALCIUM 9.1 12/11/2022 1328   CALCIUM 9.2 08/02/2017 0821   ALKPHOS 60 09/10/2022 0825   ALKPHOS 66 08/02/2017 0821   AST 20 09/10/2022 0825   AST 14 (L) 05/30/2022 0943   AST 14 08/02/2017 0821   ALT 10 09/10/2022 0825   ALT 6 05/30/2022 0943   ALT 8 08/02/2017 0821   BILITOT 0.5 09/10/2022 0825   BILITOT 0.3 05/30/2022 0943   BILITOT 0.34 08/02/2017 0821       RADIOGRAPHIC STUDIES: No results found.   ASSESSMENT AND PLAN:  This is a very pleasant 82 years old white female with a stage IA non-small cell lung cancer, squamous cell carcinoma presented with right lower lobe pulmonary nodule status post stereotactic radiotherapy.  The patient is currently on observation and she has no concerning complaints except for the baseline shortness of breath and cough. I recommended for her to continue on observation with repeat CT scan of the chest in around 4 months for restaging of her disease. For the iron deficiency anemia, repeat CBC today showed persistent anemia with hemoglobin of 9.8 and hematocrit of 30.3%.  The patient is allergic to the oral iron tablets but not the IV forms. Her iron study and ferritin are still pending. I will consider the patient for iron infusion if her iron study showed persistent deficiency. She was advised to call immediately if she has any other concerning symptoms in the interval.  All questions were answered. The patient knows to call the clinic with any problems, questions or concerns. We  can certainly see the patient much sooner if necessary.  Disclaimer: This note was dictated with voice recognition software. Similar sounding words can inadvertently be transcribed and may not be corrected upon review.

## 2023-02-18 ENCOUNTER — Ambulatory Visit: Payer: 59 | Admitting: Nurse Practitioner

## 2023-02-26 ENCOUNTER — Other Ambulatory Visit: Payer: Self-pay | Admitting: Pulmonary Disease

## 2023-03-08 ENCOUNTER — Ambulatory Visit (INDEPENDENT_AMBULATORY_CARE_PROVIDER_SITE_OTHER): Payer: 59

## 2023-03-08 ENCOUNTER — Ambulatory Visit (INDEPENDENT_AMBULATORY_CARE_PROVIDER_SITE_OTHER): Payer: 59 | Admitting: Pulmonary Disease

## 2023-03-08 ENCOUNTER — Encounter: Payer: Self-pay | Admitting: Nurse Practitioner

## 2023-03-08 ENCOUNTER — Ambulatory Visit: Payer: 59 | Admitting: Nurse Practitioner

## 2023-03-08 VITALS — BP 110/54 | HR 70 | Ht 62.0 in | Wt 146.8 lb

## 2023-03-08 DIAGNOSIS — C3431 Malignant neoplasm of lower lobe, right bronchus or lung: Secondary | ICD-10-CM

## 2023-03-08 DIAGNOSIS — J449 Chronic obstructive pulmonary disease, unspecified: Secondary | ICD-10-CM | POA: Diagnosis not present

## 2023-03-08 DIAGNOSIS — R0789 Other chest pain: Secondary | ICD-10-CM

## 2023-03-08 LAB — PULMONARY FUNCTION TEST
DL/VA % pred: 91 %
DL/VA: 3.76 ml/min/mmHg/L
DLCO cor % pred: 63 %
DLCO cor: 11.14 ml/min/mmHg
DLCO unc % pred: 63 %
DLCO unc: 11.14 ml/min/mmHg
FEF 25-75 Post: 0.87 L/sec
FEF 25-75 Pre: 0.61 L/sec
FEF2575-%Change-Post: 42 %
FEF2575-%Pred-Post: 66 %
FEF2575-%Pred-Pre: 46 %
FEV1-%Change-Post: 9 %
FEV1-%Pred-Post: 74 %
FEV1-%Pred-Pre: 68 %
FEV1-Post: 1.31 L
FEV1-Pre: 1.2 L
FEV1FVC-%Change-Post: 2 %
FEV1FVC-%Pred-Pre: 88 %
FEV6-%Change-Post: 5 %
FEV6-%Pred-Post: 87 %
FEV6-%Pred-Pre: 82 %
FEV6-Post: 1.95 L
FEV6-Pre: 1.84 L
FEV6FVC-%Change-Post: 0 %
FEV6FVC-%Pred-Post: 105 %
FEV6FVC-%Pred-Pre: 106 %
FVC-%Change-Post: 6 %
FVC-%Pred-Post: 82 %
FVC-%Pred-Pre: 77 %
FVC-Post: 1.96 L
FVC-Pre: 1.84 L
Post FEV1/FVC ratio: 67 %
Post FEV6/FVC ratio: 100 %
Pre FEV1/FVC ratio: 65 %
Pre FEV6/FVC Ratio: 100 %
RV % pred: 98 %
RV: 2.26 L
TLC % pred: 86 %
TLC: 4.1 L

## 2023-03-08 MED ORDER — PREDNISONE 20 MG PO TABS
20.0000 mg | ORAL_TABLET | Freq: Every day | ORAL | 0 refills | Status: AC
Start: 2023-03-08 — End: 2023-03-13

## 2023-03-08 NOTE — Progress Notes (Signed)
PFT done today. 

## 2023-03-08 NOTE — Assessment & Plan Note (Signed)
See above.  Follows with Dr. Arbutus Ped.  Next CT is scheduled for October 2024.

## 2023-03-08 NOTE — Assessment & Plan Note (Signed)
New onset chest wall pain to the right side.  No injury/trauma per her report.  Does not appear to be having any acute respiratory symptoms.  Unclear if this is musculoskeletal in nature but seems likely given that symptoms worsen with movement.  Will obtain CXR today to rule out acute process in the chest.  Discussed trying a short course of prednisone to see if she has any perceived benefit.  We also discussed topical analgesics.  If symptoms do not improve or worsen, she will notify us.  May consider CT imaging for further evaluation given her history of lung cancer.  She did not have any evidence of recurrence of disease on previous imaging.

## 2023-03-08 NOTE — Patient Instructions (Addendum)
Continue Trelegy 1 puff daily. Brush tongue and rinse mouth afterwards Continue Albuterol inhaler 2 puffs or 3 mL neb every 6 hours as needed for shortness of breath or wheezing. Notify if symptoms persist despite rescue inhaler/neb use.   Repeat CT chest in October and follow up with Dr. Arbutus Ped afterwards  Chest x ray today   Prednisone 20 mg daily for 5 days. Take in AM with food Let me or Dr. Everlene Other know if the pain on your right side doesn't get better  You can use guaifenesin [id number] mg Twice daily for cough/congestion  Follow up with Dr. Tonia Brooms in 4 months. If symptoms worsen, please contact office for sooner follow up or seek emergency care.

## 2023-03-08 NOTE — Assessment & Plan Note (Addendum)
Moderate COPD with 10% improvement in lung function since her previous testing 4 years ago.  She is compensated on current regimen.  No recent exacerbations requiring steroids or antibiotics.  No recent hospitalizations for her breathing.  She will continue her current bronchodilator regimen.  Work on graded exercises.  Action plan in place.  Does have some chronic bronchitis type symptoms.  Advised that she use Mucinex and work on mucociliary clearance therapies.  Could consider Daliresp in the future if this becomes bothersome.  Patient Instructions  Continue Trelegy 1 puff daily. Brush tongue and rinse mouth afterwards Continue Albuterol inhaler 2 puffs or 3 mL neb every 6 hours as needed for shortness of breath or wheezing. Notify if symptoms persist despite rescue inhaler/neb use.   Repeat CT chest in October and follow up with Dr. Arbutus Ped afterwards  Chest x ray today   Prednisone 20 mg daily for 5 days. Take in AM with food Let me or Dr. Everlene Other know if the pain on your right side doesn't get better  You can use guaifenesin [id number] mg Twice daily for cough/congestion  Follow up with Dr. Tonia Brooms in 4 months. If symptoms worsen, please contact office for sooner follow up or seek emergency care.

## 2023-03-08 NOTE — Progress Notes (Signed)
@Patient  ID: [name], female    DOB: 1941/07/05, 82 y.o.   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 She is a patient of Dr. Myrlene Mccullough and last seen in office 08/22/2022. Past medical history significant for CAD s/p CABG, carotid artery disease, HTN, diastolic CHF, PVD, GERD, history of GI bleeds, HLD, IDA.   TEST/EVENTS:  08/26/2018 PFT: FVC 65, FEV1 58, ratio 66, TLC 81, DLCO 61. Positive BD 11/19/2021 echocardiogram: EF 60-65%, GIDD, RV nl, LA mildly dilated. Small pericardial effusion.  05/23/2022 CT chest with contrast: extensive atherosclerosis. S/p CABG. Mild emphysema with stable scarring. Stable treated RLL nodule. LLL nodule has resolved. Calcified lesion in the RUL. Lesion in left haptic lobe unchanged, benign. Chronic fx in the right 7th rib with incomplete healing and adjacent pleural thickening.  07/23/2022 NM stress test: no reversible ischemia or infarction. Normal EF  03/07/2022: OV with Dr. Tonia Mccullough.  Presented for follow-up after CT scan of the chest.  CT showed mild interval increase in the right lower lobe lesion that she had radiation to before.  There was also a subsolid lesion in the left lower lobe that is new.  From a respiratory standpoint, she is doing well on Trelegy.  Recently had surgery on right hand for carpal tunnel without any postoperative complications.  Plan for repeat CT in 3 months.  Will discuss with Dr. Shirline Mccullough to determine if repeat biopsy is necessary depending on follow-up scan.  08/01/2022: OV with Natalie Bouffard NP for acute visit.  She is with her daughter, who is very frustrated because she has been having trouble with chest pain, nausea and near syncope for around a month now.  She has had multiple episodes that occur only after exertion.  She had  contacted EMS towards the end of November or beginning of December after being in [employer] with onset of chest pain to the left side and in between her shoulder blades followed by feeling very flushed and nauseous.  She was told by the EMTs that she was getting enough oxygen to her heart so they placed her on oxygen and gave her nitroglycerin.  Her blood pressure was also noted to be significantly elevated with systolics in the 200s.  She had improvement in her symptoms after this so did not end up going to the hospital.  She then went to the ED on 12/4 after a similar event happened.  She again had chest pain followed by lightheadedness, near syncope and nausea without vomiting.  During this stay, she underwent nuclear stress test.  Her daughter tells me that during the test she was told that she was having a heart attack and again was treated with nitroglycerin with improvement in symptoms.  No notes to review in her chart from cardiology during this time.  They tell me that the cardiologist at the hospital was Dr. Algie Mccullough. She was discharged home with instructions to limit exertion and advised to follow up with her cardiologist outpatient, Dr. Sharyn Mccullough. She saw him around a week ago and was told that her stress test was normal. No further interventions were made, per her report. They tell me that they have been told it's not her heart and it's not her stomach.  Regarding her breathing, she does get a  little more short winded when these events occur but otherwise, her breathing feels stable. She has not had any drops in her oxygen levels; always >90%. She is worried that her cancer has come back. Scan from October showed stable radiated changes to the RLL and resolution of LLL nodule. She has no significant cough, chest congestion or wheezing. She denies hemoptysis or weight loss, changes in bowel habits, bloody stools, difficulties swallowing. She is using her Trelegy 3-4 times a week. She has had pneumonia  before when she was using it daily and they thought her Trelegy was the reason why. Rarely using rescue inhaler. Symptoms improve with nitroglycerin; no change with bronchodilators.   08/22/2022: OV with Dr. Tonia Mccullough.  Recently went to the ED.  From a respiratory standpoint back to her baseline.  Breathing okay using her inhaler regimen.  Has follow-up imaging that was completed by Dr. Arbutus Mccullough back in October that shows stability no evidence of recurrence of disease.  Working with cardiology as she has these ongoing episodes of feeling flushed with cold sweats and chest pain.  These have improved with nitro.  Had a recent nuclear stress test that was negative.  No cardiac cath due to her kidney disease.  03/08/2023: Today-follow-up Patient resents today for follow-up after undergoing pulmonary function testing which revealed moderate obstructive lung disease without reversibility and moderate diffusing defect that corrects to normal for alveolar volume.  She actually had improvement by about 10% in her lung function compared to 4 years ago.  Tells me that she has been doing relatively well since she was here last.  Feels like her breathing is at her baseline.  She does have a daily productive cough with white phlegm that seems to be unchanged from her baseline.  Not having any more chest congestion.  She is having some discomfort to her right side that started over the last week or so.  Notices it more so when she is moving around in the bed.  She does also noticed that when she takes a big deep breath it sometimes hurts.  Describes it as sharp.  She is having fatigue that is unchanged from her baseline.  She has been treated for a chronic anemia.  She denies any hemoptysis, fevers, chills, night sweats, weight loss, anorexia.  She denies any injury or trauma to the area.  Using all her inhalers as prescribed.  Has not had to use her rescue recently.  Allergies  Allergen Reactions   Avelox [Moxifloxacin Hcl In  Nacl] Palpitations and Other (See Comments)    Caused Heart Attack    Azithromycin Swelling and Other (See Comments)    Patient reported past history of lip swelling   Codeine Other (See Comments)    Dr. Sharyn Mccullough advised patient not to take this medication   Doxycycline Swelling and Other (See Comments)    Mouth, lips, feet swelling   Hydromorphone Palpitations and Other (See Comments)    DILAUDID  -  Pt had a Heart Attack after taking Dilaudid.   Levaquin [Levofloxacin] Shortness Of Breath and Other (See Comments)    Chest pressure, SOB, "pain in between shoulder blades", sweaty -as reported by patient per experience in ED this afternoon   Vitamin D Analogs Swelling and Other (See Comments)    Face and lips swell, but no breathing issues   Nifedipine Er Other (See Comments)    Dropped the heart rate too low   Octreotide     Chest pain, N/V  Oxycodone-Acetaminophen Other (See Comments)    Says it makes her "feel weird"   Risedronate Other (See Comments)    Chest pain    Immunization History  Administered Date(s) Administered   Fluad Quad(high Dose 65+) 04/24/2018, 04/07/2019, 05/25/2020, 04/27/2021, 04/25/2022   Influenza Split 05/15/2012   Influenza Whole 05/13/2012   Influenza, High Dose Seasonal PF 05/22/2013, 07/13/2014, 04/24/2018, 05/13/2018, 04/07/2019   Influenza,inj,Quad PF,6+ Mos 05/15/2012, 05/15/2012, 04/14/2015, 05/18/2015, 05/18/2015, 04/10/2016, 04/10/2016, 04/30/2017, 04/30/2017, 04/24/2018   Influenza-Unspecified 05/15/2012, 05/22/2013, 07/13/2014, 05/18/2015, 04/10/2016   PFIZER Comirnaty(Gray Top)Covid-19 Tri-Sucrose Vaccine 03/29/2021   PFIZER(Purple Top)SARS-COV-2 Vaccination 09/28/2019, 10/20/2019, 06/17/2020   Pfizer Covid-19 Vaccine Bivalent Booster 10yrs & up 07/26/2021   Pneumococcal Conjugate-13 05/15/2012   Pneumococcal Polysaccharide-23 11/30/2014    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HCC)    Arnold-Chiari malformation (HCC) 1998   Asthma    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   Blood in stool    last week of aug 2018   CAP (community acquired pneumonia) 10/22/2015   Chronic kidney disease    COPD (chronic obstructive pulmonary disease) (HCC)    Coronary artery disease    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   Diverticulitis    Dyspnea    with exertion   Gastroesophageal reflux disease    occ   Glaucoma    right eye   Headache syndrome 11/27/2018   Hiatal hernia    History of shingles 06/23/2018   Hyperlipidemia    Hypertension    Lung cancer (HCC) dx 2018   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   Myocardial infarction (HCC) 01/01/2000   Cardiac catheterization   Peripheral vascular disease (HCC)    stents in legs x 2 or 3   Pneumonia    last time winter 2017 -2018   PONV (postoperative nausea and vomiting)    occassionally, last colonscopy did ok with anesthesia   Primary cancer of right lower lobe of lung (HCC) 04/25/2016   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   TIA (transient ischemic attack) 02/11/2019   Wears dentures    Full set   Wears glasses     Tobacco History: Social History   Tobacco Use  Smoking Status Former   Current packs/day: 0.00   Average packs/day: 1.5 packs/day for 30.0 years (45.0 ttl pk-yrs)   Types: Cigarettes   Start date: 08/13/1970   Quit date: 08/13/2000   Years since quitting: 22.5   Passive exposure: Never  Smokeless Tobacco Never   Counseling given: Not Answered   Outpatient Medications Prior to Visit  Medication Sig Dispense Refill   acetaminophen (TYLENOL) 500 MG tablet Take 500-1,000 mg by mouth every 6 (six) hours as needed (for pain).     albuterol (VENTOLIN HFA) 108 (90 Base) MCG/ACT inhaler Inhale 2 puffs into the lungs every 6 (six) hours as needed for wheezing or shortness of breath. 8 g 6   amoxicillin-clavulanate (AUGMENTIN) 500-125 MG tablet  Take 1 tablet (500 mg total) by mouth 2 (two) times daily. 14 tablet 0   atorvastatin (LIPITOR) 40 MG tablet Take 1 tablet (40 mg total) by mouth at bedtime. 30 tablet 6   benzonatate (TESSALON) 100 MG capsule Take 1 capsule (100 mg total) by mouth every 8 (eight) hours. 21 capsule 0   cetirizine (ZYRTEC) 10 MG tablet Take 10 mg by mouth daily as needed for allergies.      clopidogrel (  PLAVIX) 75 MG tablet Take 1 tablet (75 mg total) by mouth daily. (Patient taking differently: Take 37.5 mg by mouth in the morning.) 90 tablet 3   fluorouracil (EFUDEX) 5 % cream Apply topically.     furosemide (LASIX) 40 MG tablet Take 40 mg by mouth in the morning.     lidocaine (LIDODERM) 5 % Place 1 patch onto the skin daily. Remove & Discard patch within 12 hours or as directed by MD 30 patch 0   Magnesium Oxide 400 MG CAPS Take 400 mg by mouth in the morning.     nitroGLYCERIN (NITROSTAT) 0.4 MG SL tablet Place 1 tablet (0.4 mg total) under the tongue every 5 (five) minutes x 3 doses as needed for chest pain. 10 tablet 0   olmesartan (BENICAR) 20 MG tablet Take 1 tablet (20 mg total) by mouth daily. (Patient taking differently: Take 10 mg by mouth in the morning.) 90 tablet 1   ondansetron (ZOFRAN-ODT) 4 MG disintegrating tablet Take 4 mg by mouth every 8 (eight) hours as needed for nausea or vomiting (dissolve in the mouth).     pantoprazole (PROTONIX) 40 MG tablet TAKE 1 TABLET BY MOUTH TWICE A DAY 180 tablet 1   TRELEGY ELLIPTA 200-62.5-25 MCG/ACT AEPB USE 1 INHALATION BY MOUTH DAILY 180 each 3   No facility-administered medications prior to visit.     Review of Systems:   Constitutional: No weight loss or gain, night sweats, fevers, chills, lassitude. +fatigue. HEENT: No headaches, difficulty swallowing, tooth/dental problems, or sore throat. No sneezing, itching, ear ache, nasal congestion, or post nasal drip CV:   No PND, swelling in lower extremities, anasarca, palpitations, syncope,  dizziness/lightheadedness, chest pain  Resp: +shortness of breath with exertion; productive cough. No excess mucus or change in color of mucus. No hemoptysis. No wheezing.  No chest wall deformity GI:  No heartburn, indigestion, abdominal pain, vomiting, nausea, diarrhea, change in bowel habits, loss of appetite, bloody stools.  GU: No dysuria, change in color of urine, urgency or frequency.   Skin: No rash, lesions, ulcerations MSK:  No joint pain or swelling.  +right chest wall pain Neuro: No gait abnormalities. No confusion.  Psych: No depression or anxiety. Mood stable.     Physical Exam:  BP (!) 110/54   Pulse 70   Ht 5\' 2"  (1.575 m)   Wt 146 lb 12.8 oz (66.6 kg)   SpO2 97%   BMI 26.85 kg/m   GEN: Pleasant, interactive, well-appearing; in no acute distress during initial evaluation.  HEENT:  Normocephalic and atraumatic. PERRLA. Sclera white. Nasal turbinates pink, moist and patent bilaterally. No rhinorrhea present. Oropharynx pink and moist, without exudate or edema. No lesions, ulcerations, or postnasal drip.  NECK:  Supple w/ fair ROM. No JVD present. Normal carotid impulses w/o bruits. Thyroid symmetrical with no goiter or nodules palpated. No lymphadenopathy.   CV: RRR, no m/r/g, no peripheral edema. Pulses intact, +2 bilaterally. No cyanosis, pallor or clubbing. PULMONARY:  Unlabored, regular breathing. Clear bilaterally A&P w/o wheezes/rales/rhonchi. No accessory muscle use.  GI: BS present and normoactive. Soft, non-tender to palpation. No organomegaly or masses detected.  MSK: No erythema, warmth or tenderness. Cap refil <2 sec all extrem. No deformities or joint swelling noted.  Neuro: A/Ox3. No focal deficits noted.   Skin: Warm, no lesions or rashe Psych: Normal affect and behavior. Judgement and thought content appropriate.     Lab Results:  CBC    Component Value Date/Time   WBC  6.1 01/24/2023 0820   WBC 10.5 12/11/2022 1328   RBC 3.31 (L) 01/24/2023 0820    HGB 9.8 (L) 01/24/2023 0820   HGB 9.9 (L) 08/02/2017 0821   HCT 30.3 (L) 01/24/2023 0820   HCT 29.8 (L) 08/02/2017 0821   PLT 259 01/24/2023 0820   PLT 218 08/02/2017 0821   MCV 91.5 01/24/2023 0820   MCV 92.3 08/02/2017 0821   MCH 29.6 01/24/2023 0820   MCHC 32.3 01/24/2023 0820   RDW 15.9 (H) 01/24/2023 0820   RDW 14.4 08/02/2017 0821   LYMPHSABS 1.9 01/24/2023 0820   LYMPHSABS 2.6 08/02/2017 0821   MONOABS 0.5 01/24/2023 0820   MONOABS 0.5 08/02/2017 0821   EOSABS 0.5 01/24/2023 0820   EOSABS 0.2 08/02/2017 0821   BASOSABS 0.1 01/24/2023 0820   BASOSABS 0.0 08/02/2017 0821    BMET    Component Value Date/Time   NA 135 12/11/2022 1328   NA 139 08/02/2017 0821   K 4.3 12/11/2022 1328   K 4.3 08/02/2017 0821   CL 101 12/11/2022 1328   CO2 24 12/11/2022 1328   CO2 24 08/02/2017 0821   GLUCOSE 106 (H) 12/11/2022 1328   GLUCOSE 100 08/02/2017 0821   BUN 39 (H) 12/11/2022 1328   BUN 14.4 08/02/2017 0821   CREATININE 1.76 (H) 12/11/2022 1328   CREATININE 1.69 (H) 05/30/2022 0943   CREATININE 1.3 (H) 08/02/2017 0821   CALCIUM 9.1 12/11/2022 1328   CALCIUM 9.2 08/02/2017 0821   GFRNONAA 29 (L) 12/11/2022 1328   GFRNONAA 31 (L) 05/30/2022 0943   GFRAA 34 (L) 02/26/2020 0842    BNP    Component Value Date/Time   BNP 68.0 09/10/2022 0825     Imaging:  DG Chest 2 View  Result Date: 03/08/2023 CLINICAL DATA:  Right-sided posterior chest wall pain, history of lung cancer in EXAM: CHEST - 2 VIEW COMPARISON:  09/10/2022, 05/23/2022 FINDINGS: Frontal and lateral views of the chest demonstrate an unremarkable cardiac silhouette. Stable postsurgical changes from CABG. Chronic areas of scarring are seen throughout the lungs, with no acute airspace disease, effusion, or pneumothorax. Stable peripheral density within the right lower lobe consistent with prior treated lung cancer. No acute bony abnormality. The chronic right posterior seventh rib fracture seen on the prior CT is  less apparent by x-ray. IMPRESSION: 1. Stable chest, no acute process. Electronically Signed   By: Sharlet Salina M.D.   On: 03/08/2023 16:20    Administration History     None          Latest Ref Rng & Units 03/08/2023    2:27 PM 08/26/2018   12:59 PM 02/14/2018   11:18 AM 04/10/2016    9:11 AM  PFT Results  FVC-Pre L 1.84  P 1.69  1.80  P 1.53   FVC-Predicted Pre % 77  P 65  69  P 57   FVC-Post L 1.96  P 1.91  1.99  P 1.82   FVC-Predicted Post % 82  P 74  77  P 69   Pre FEV1/FVC % % 65  P 67  72  P 71   Post FEV1/FCV % % 67  P 66  72  P 70   FEV1-Pre L 1.20  P 1.12  1.30  P 1.09   FEV1-Predicted Pre % 68  P 58  67  P 54   FEV1-Post L 1.31  P 1.26  1.43  P 1.28   DLCO uncorrected ml/min/mmHg 11.14  P 11.93  11.07   DLCO UNC% % 63  P 55   51   DLCO corrected ml/min/mmHg 11.14  P 13.35     DLCO COR %Predicted % 63  P 61     DLVA Predicted % 91  P 97   86   TLC L 4.10  P 3.85   3.77   TLC % Predicted % 86  P 81   79   RV % Predicted % 98  P 94   96     P Preliminary result    No results found for: "NITRICOXIDE"      Assessment & Plan:   COPD GOLD II Moderate COPD with 10% improvement in lung function since her previous testing 4 years ago.  She is compensated on current regimen.  No recent exacerbations requiring steroids or antibiotics.  No recent hospitalizations for her breathing.  She will continue her current bronchodilator regimen.  Work on graded exercises.  Action plan in place.  Does have some chronic bronchitis type symptoms.  Advised that she use Mucinex and work on mucociliary clearance therapies.  Could consider Daliresp in the future if this becomes bothersome.  Patient Instructions  Continue Trelegy 1 puff daily. Brush tongue and rinse mouth afterwards Continue Albuterol inhaler 2 puffs or 3 mL neb every 6 hours as needed for shortness of breath or wheezing. Notify if symptoms persist despite rescue inhaler/neb use.   Repeat CT chest in October and follow  up with Dr. Arbutus Mccullough afterwards  Chest x ray today   Prednisone 20 mg daily for 5 days. Take in AM with food Let me or Dr. Everlene Other know if the pain on your right side doesn't get better  You can use guaifenesin [phone] mg Twice daily for cough/congestion  Follow up with Dr. Tonia Mccullough in 4 months. If symptoms worsen, please contact office for sooner follow up or seek emergency care.    Chest wall pain New onset chest wall pain to the right side.  No injury/trauma per her report.  Does not appear to be having any acute respiratory symptoms.  Unclear if this is musculoskeletal in nature but seems likely given that symptoms worsen with movement.  Will obtain CXR today to rule out acute process in the chest.  Discussed trying a short course of prednisone to see if she has any perceived benefit.  We also discussed topical analgesics.  If symptoms do not improve or worsen, she will notify us.  May consider CT imaging for further evaluation given her history of lung cancer.  She did not have any evidence of recurrence of disease on previous imaging.  Primary cancer of right lower lobe of lung (HCC) See above.  Follows with Dr. Arbutus Mccullough.  Next CT is scheduled for October 2024.    I spent 38 minutes of dedicated to the care of this patient on the date of this encounter to include pre-visit review of records, face-to-face time with the patient discussing conditions above, post visit ordering of testing, clinical documentation with the electronic health record, making appropriate referrals as documented, and communicating necessary findings to members of the patients care team.  Noemi Chapel, NP 03/08/2023  Pt aware and understands NP's role.

## 2023-04-02 ENCOUNTER — Telehealth: Payer: Self-pay | Admitting: Psychiatry

## 2023-04-02 ENCOUNTER — Ambulatory Visit: Payer: 59 | Admitting: Psychiatry

## 2023-04-02 ENCOUNTER — Encounter: Payer: Self-pay | Admitting: Psychiatry

## 2023-04-02 VITALS — BP 148/103 | HR 56 | Ht 62.0 in | Wt 146.5 lb

## 2023-04-02 DIAGNOSIS — G43109 Migraine with aura, not intractable, without status migrainosus: Secondary | ICD-10-CM | POA: Diagnosis not present

## 2023-04-02 DIAGNOSIS — S0990XS Unspecified injury of head, sequela: Secondary | ICD-10-CM | POA: Diagnosis not present

## 2023-04-02 MED ORDER — NURTEC 75 MG PO TBDP: 75.0000 mg | ORAL_TABLET | ORAL | 6 refills | Status: DC | PRN

## 2023-04-02 NOTE — Progress Notes (Signed)
[facility]  PATIENT: [name] DOB: 25-Nov-1940  REFERRING CLINICIAN: Tracey Harries, MD HISTORY FROM: self, daughter REASON FOR VISIT: facial paresthesias   HISTORICAL  CHIEF COMPLAINT:  Chief Complaint  Patient presents with   Room 11    Pt is here with her Daughter. Pt states that her head has been hurting for the past couple of days. Pt states she hit her head this morning. Pt that she doesn't  have any new symptoms to discuss today.     HISTORY OF PRESENT ILLNESS:  The patient presents for evaluation of facial paresthesias. In April 2024 she developed paresthesias on the right side of her face, which was associated with a headache. Presented to the ED where MRI/MRA were negative for acute process. These symptoms have resolved. She reports that this is typical for her headaches and she is not concerned about the numbness today.  Today she is more concerned because she has had a severe headache for the past 4 days. It is associated with photophobia, phonophobia, and nausea. She takes Tylenol which helps somewhat but does not resolve the pain. Cannot take triptans or NSAIDs. She has a history of migraines and reports frequent headaches, typically has some form of headache at least 14 days per month. She hit her head on her daughter's car door this morning and currently has a cut over her right eyebrow.  She has a history of face numbness with headaches, documented as far back as 2014. Was previously diagnosed with occipital neuralgia and migraine. Previous migraine preventives include topamax, gabapentin, metoprolol, and losartan.  In July 2020 she developed right face/arm numbness and slurred speech. MRI brain was unremarkable. CTA head/neck showed right vertebral stenosis and prior left ICA endarterectomy. She was continued on Plavix, but continued to have intermittent bilateral hand paresthesias. EMG revealed mild right and moderate left carpal  tunnel.   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 Sharyn Lull advised patient not to take this medication   Doxycycline Swelling and Other (See Comments)    Mouth, lips, feet swelling   Hydromorphone Palpitations and Other (See Comments)    DILAUDID  -  Pt had a Heart Attack after taking Dilaudid.   Levaquin [Levofloxacin] Shortness Of Breath and Other (See Comments)    Chest pressure, SOB, "pain in between shoulder blades", sweaty -as reported by patient per experience in ED this afternoon   Vitamin D Analogs Swelling and Other (See Comments)    Face and lips swell, but no breathing issues   Nifedipine Er Other (See Comments)    Dropped the heart rate too low   Octreotide     Chest pain, N/V   Oxycodone-Acetaminophen Other (See Comments)    Says it makes her "feel weird"   Risedronate Other (See Comments)    Chest pain    HOME MEDICATIONS: Outpatient Medications Prior to Visit  Medication Sig Dispense Refill   acetaminophen (TYLENOL) 500 MG tablet Take 500-1,000 mg by mouth every 6 (six) hours as needed (for pain).  albuterol (VENTOLIN HFA) 108 (90 Base) MCG/ACT inhaler Inhale 2 puffs into the lungs every 6 (six) hours as needed for wheezing or shortness of breath. 8 g 6   amoxicillin-clavulanate (AUGMENTIN) 500-125 MG tablet Take 1 tablet (500 mg total) by mouth 2 (two) times daily. 14 tablet 0   atorvastatin (LIPITOR) 40 MG tablet Take 1 tablet (40 mg total) by mouth at bedtime. 30 tablet 6    benzonatate (TESSALON) 100 MG capsule Take 1 capsule (100 mg total) by mouth every 8 (eight) hours. 21 capsule 0   cetirizine (ZYRTEC) 10 MG tablet Take 10 mg by mouth daily as needed for allergies.      clopidogrel (PLAVIX) 75 MG tablet Take 1 tablet (75 mg total) by mouth daily. (Patient taking differently: Take 37.5 mg by mouth in the morning.) 90 tablet 3   fluorouracil (EFUDEX) 5 % cream Apply topically.     furosemide (LASIX) 40 MG tablet Take 40 mg by mouth in the morning.     lidocaine (LIDODERM) 5 % Place 1 patch onto the skin daily. Remove & Discard patch within 12 hours or as directed by MD 30 patch 0   Magnesium Oxide 400 MG CAPS Take 400 mg by mouth in the morning.     nitroGLYCERIN (NITROSTAT) 0.4 MG SL tablet Place 1 tablet (0.4 mg total) under the tongue every 5 (five) minutes x 3 doses as needed for chest pain. 10 tablet 0   olmesartan (BENICAR) 20 MG tablet Take 1 tablet (20 mg total) by mouth daily. (Patient taking differently: Take 10 mg by mouth in the morning.) 90 tablet 1   ondansetron (ZOFRAN-ODT) 4 MG disintegrating tablet Take 4 mg by mouth every 8 (eight) hours as needed for nausea or vomiting (dissolve in the mouth).     pantoprazole (PROTONIX) 40 MG tablet TAKE 1 TABLET BY MOUTH TWICE A DAY 180 tablet 1   TRELEGY ELLIPTA 200-62.5-25 MCG/ACT AEPB USE 1 INHALATION BY MOUTH DAILY 180 each 3   No facility-administered medications prior to visit.    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   Diverticulitis    Dyspnea    with exertion    Gastroesophageal reflux disease    occ   Glaucoma    right eye   Headache syndrome 11/27/2018   Hiatal hernia    History of shingles 06/23/2018   Hyperlipidemia    Hypertension    Lung cancer (HCC) dx 2018   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   Myocardial infarction (HCC) 01/01/2000   Cardiac catheterization   Peripheral vascular disease (HCC)    stents in legs x 2 or 3   Pneumonia    last time winter 2017 -2018   PONV (postoperative nausea and vomiting)    occassionally, last colonscopy did ok with anesthesia   Primary cancer of right lower lobe of lung (HCC) 04/25/2016   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   TIA (transient ischemic attack) 02/11/2019   Wears dentures  Full set   Wears glasses     PAST SURGICAL HISTORY: Past Surgical History:  Procedure Laterality Date   ABDOMINAL HYSTERECTOMY     APPENDECTOMY     Arnold-chiari malformation repair  1998   Suboccipital craniectomy   CAROTID ENDARTERECTOMY  03/29/2010   Left  CEA   CHOLECYSTECTOMY     Gall Bladder   COLONOSCOPY WITH PROPOFOL N/A 04/22/2015   Procedure: COLONOSCOPY WITH PROPOFOL;  Surgeon: Jeani Hawking, MD;  Location: [facility]COPY;  Service: Endoscopy;  Laterality: N/A;   COLONOSCOPY WITH PROPOFOL N/A 05/25/2016   Procedure: COLONOSCOPY WITH PROPOFOL;  Surgeon: Jeani Hawking, MD;  Location: [facility];  Service: Endoscopy;  Laterality: N/A;   COLONOSCOPY WITH PROPOFOL N/A 05/03/2017   Procedure: COLONOSCOPY WITH PROPOFOL;  Surgeon: Jeani Hawking, MD;  Location: [facility];  Service: Endoscopy;  Laterality: N/A;   COLONOSCOPY WITH PROPOFOL N/A 06/29/2020   Procedure: COLONOSCOPY WITH PROPOFOL;  Surgeon: Jeani Hawking, MD;  Location: [facility];  Service: Endoscopy;  Laterality: N/A;   COLONOSCOPY WITH PROPOFOL N/A 01/05/2021   Procedure: COLONOSCOPY WITH PROPOFOL;  Surgeon: Jeani Hawking, MD;  Location: [facility];  Service: Endoscopy;  Laterality: N/A;   COLONOSCOPY  WITH PROPOFOL N/A 09/28/2021   Procedure: COLONOSCOPY WITH PROPOFOL;  Surgeon: Jeani Hawking, MD;  Location: [facility] ENDOSCOPY;  Service: Endoscopy;  Laterality: N/A;   CORNEAL TRANSPLANT     Right   CORONARY ARTERY BYPASS GRAFT  01/01/2000   x 3   ENDARTERECTOMY Right 09/26/2021   Procedure: RIGHT CAROTID ENDARTERECTOMY;  Surgeon: Chuck Hint, MD;  Location: [facility] OR;  Service: Vascular;  Laterality: Right;   ENTEROSCOPY N/A 02/27/2018   Procedure: ENTEROSCOPY;  Surgeon: Jeani Hawking, MD;  Location: [facility];  Service: Endoscopy;  Laterality: N/A;   ENTEROSCOPY N/A 07/18/2018   Procedure: ENTEROSCOPY;  Surgeon: Jeani Hawking, MD;  Location: [facility];  Service: Endoscopy;  Laterality: N/A;   ENTEROSCOPY N/A 06/29/2020   Procedure: ENTEROSCOPY;  Surgeon: Jeani Hawking, MD;  Location: [facility];  Service: Endoscopy;  Laterality: N/A;   ENTEROSCOPY N/A 12/05/2021   Procedure: ENTEROSCOPY;  Surgeon: Jeani Hawking, MD;  Location: [facility];  Service: Gastroenterology;  Laterality: N/A;   ENTEROSCOPY N/A 12/25/2021   Procedure: ENTEROSCOPY;  Surgeon: Jeani Hawking, MD;  Location: [facility];  Service: Gastroenterology;  Laterality: N/A;  with endoscopic placement of video capsule   ESOPHAGOGASTRODUODENOSCOPY N/A 05/25/2016   Procedure: ESOPHAGOGASTRODUODENOSCOPY (EGD);  Surgeon: Jeani Hawking, MD;  Location: [facility] ENDOSCOPY;  Service: Endoscopy;  Laterality: N/A;   ESOPHAGOGASTRODUODENOSCOPY N/A 08/01/2018   Procedure: ESOPHAGOGASTRODUODENOSCOPY (EGD);  Surgeon: Jeani Hawking, MD;  Location: [facility] ENDOSCOPY;  Service: Endoscopy;  Laterality: N/A;   ESOPHAGOGASTRODUODENOSCOPY (EGD) WITH PROPOFOL N/A 09/28/2021   Procedure: ESOPHAGOGASTRODUODENOSCOPY (EGD) WITH PROPOFOL;  Surgeon: Jeani Hawking, MD;  Location: [facility] ENDOSCOPY;  Service: Endoscopy;  Laterality: N/A;   EYE SURGERY Right 1995 or 1996   Laser surgery for retinal hemorrhage   GIVENS CAPSULE STUDY N/A 07/16/2018   Procedure: GIVENS  CAPSULE STUDY;  Surgeon: Jeani Hawking, MD;  Location: [facility];  Service: Endoscopy;  Laterality: N/A;   GIVENS CAPSULE STUDY N/A 12/14/2021   Procedure: GIVENS CAPSULE STUDY;  Surgeon: Jeani Hawking, MD;  Location: [facility] ENDOSCOPY;  Service: Gastroenterology;  Laterality: N/A;   GIVENS CAPSULE STUDY N/A 12/25/2021   Procedure: GIVENS CAPSULE STUDY;  Surgeon: Jeani Hawking, MD;  Location: [facility];  Service: Gastroenterology;  Laterality: N/A;   HEMOSTASIS CLIP PLACEMENT  06/29/2020   Procedure: HEMOSTASIS CLIP PLACEMENT;  Surgeon: Jeani Hawking, MD;  Location: [facility] ENDOSCOPY;  Service: Endoscopy;;   HEMOSTASIS CLIP PLACEMENT  01/05/2021   Procedure: HEMOSTASIS CLIP PLACEMENT;  Surgeon: Jeani Hawking, MD;  Location: [facility];  Service: Endoscopy;;   HOT HEMOSTASIS N/A 02/27/2018   Procedure: HOT HEMOSTASIS (ARGON PLASMA COAGULATION/BICAP);  Surgeon: Jeani Hawking, MD;  Location: [facility] ENDOSCOPY;  Service: Endoscopy;  Laterality: N/A;   HOT HEMOSTASIS N/A 08/01/2018   Procedure: HOT HEMOSTASIS (ARGON PLASMA COAGULATION/BICAP);  Surgeon: Jeani Hawking, MD;  Location: [facility] ENDOSCOPY;  Service: Endoscopy;  Laterality: N/A;   HOT HEMOSTASIS N/A 06/29/2020   Procedure: HOT HEMOSTASIS (ARGON PLASMA COAGULATION/BICAP);  Surgeon: Jeani Hawking, MD;  Location: [facility] ENDOSCOPY;  Service: Endoscopy;  Laterality: N/A;   HOT HEMOSTASIS N/A 01/05/2021   Procedure: HOT HEMOSTASIS (ARGON PLASMA COAGULATION/BICAP);  Surgeon: Jeani Hawking, MD;  Location: [facility] ENDOSCOPY;  Service: Endoscopy;  Laterality: N/A;   HOT HEMOSTASIS N/A 12/05/2021   Procedure: HOT HEMOSTASIS (ARGON PLASMA COAGULATION/BICAP);  Surgeon: Jeani Hawking, MD;  Location: [facility] ENDOSCOPY;  Service: Gastroenterology;  Laterality: N/A;   IR RADIOLOGIST EVAL & MGMT  12/14/2016   IR RADIOLOGIST EVAL & MGMT  07/26/2021   LEFT HEART CATH AND CORS/GRAFTS ANGIOGRAPHY N/A 01/09/2018   Procedure: LEFT HEART CATH AND CORS/GRAFTS ANGIOGRAPHY;  Surgeon: Rinaldo Cloud,  MD;  Location: [facility] INVASIVE CV LAB;  Service: Cardiovascular;  Laterality: N/A;   LEFT HEART CATHETERIZATION WITH CORONARY ANGIOGRAM N/A 08/03/2014   Procedure: LEFT HEART CATHETERIZATION WITH CORONARY ANGIOGRAM;  Surgeon: Ricki Rodriguez, MD;  Location: [facility] CATH LAB;  Service: Cardiovascular;  Laterality: N/A;   PATCH ANGIOPLASTY Right 09/26/2021   Procedure: PATCH ANGIOPLASTY RIGHT CAROTID;  Surgeon: Chuck Hint, MD;  Location: [facility] OR;  Service: Vascular;  Laterality: Right;   Post Coronary Artery  BPG  01/05/2000   Right jugular sheath removed   PR VEIN BYPASS GRAFT,AORTO-FEM-POP     ROTATOR CUFF REPAIR     Right    FAMILY HISTORY: Family History  Problem Relation Age of Onset   Heart disease Mother        Heart Disease before age 69   Hypertension Mother    Hyperlipidemia Mother    Heart attack Mother    Clotting disorder Mother    Heart disease Father        Heart Disease before age 68   Heart attack Father    Hyperlipidemia Father    Hypertension Father    Heart disease Brother        Heart Disease before age 7   Hyperlipidemia Brother    Hypertension Brother    Clotting disorder Brother    AAA (abdominal aortic aneurysm) Brother    Cerebral aneurysm Sister    Hypertension Sister    AAA (abdominal aortic aneurysm) Sister    Asthma Sister    Cerebral aneurysm Brother    Cancer Brother        Lung   Hypertension Brother    Heart attack Brother    Heart disease Brother        Aneurysm of Brain   Hypertension Brother    Heart disease Brother    Heart disease Brother    Stroke Son        Aneurysm of Stomach   AAA (abdominal aortic aneurysm) Son    Cancer Maternal Uncle        great uncle/cancer/type unknown    SOCIAL HISTORY: Social History   Socioeconomic History   Marital status: Widowed  Spouse name: Not on file   Number of children: 4   Years of education: 7TH   Highest education level: Not on file  Occupational History   Not on file   Tobacco Use   Smoking status: Former    Current packs/day: 0.00    Average packs/day: 1.5 packs/day for 30.0 years (45.0 ttl pk-yrs)    Types: Cigarettes    Start date: 08/13/1970    Quit date: 08/13/2000    Years since quitting: 22.6    Passive exposure: Never   Smokeless tobacco: Never  Vaping Use   Vaping status: Never Used  Substance and Sexual Activity   Alcohol use: No    Alcohol/week: 0.0 standard drinks of alcohol   Drug use: No   Sexual activity: Never  Other Topics Concern   Not on file  Social History Narrative   Lives at home w/ her son   Right-handed   Drinks coffee and [employer]eps[employer] daily   Social Determinants of Health   Financial Resource Strain: Low Risk  (03/02/2023)   Received from [facility]   Overall Financial Resource Strain (CARDIA)    Difficulty of Paying Living Expenses: Not hard at all  Food Insecurity: Unknown (03/02/2023)   Received from [facility]   Hunger Vital Sign    Worried About Running Out of Food in the Last Year: Never true    Ran Out of Food in the Last Year: Patient declined  Transportation Needs: No Transportation Needs (03/02/2023)   Received from [facility] - Transportation    Lack of Transportation (Medical): No    Lack of Transportation (Non-Medical): No  Physical Activity: Unknown (03/02/2023)   Received from [facility]   Exercise Vital Sign    Days of Exercise per Week: 0 days    Minutes of Exercise per Session: Not on file  Stress: Stress Concern Present (03/02/2023)   Received from [facility] - Occupational Stress Questionnaire    Feeling of Stress : To some extent  Social Connections: Somewhat Isolated (09/09/2022)   Received from [facility]   Social Network    How would you rate your social network (family, work, friends)?: Restricted participation with some degree of social isolation  Intimate Partner Violence: Not At Risk (03/02/2023)    Received from [facility]   HITS    Over the last 12 months how often did your partner physically hurt you?: 1    Over the last 12 months how often did your partner insult you or talk down to you?: 1    Over the last 12 months how often did your partner threaten you with physical harm?: 1    Over the last 12 months how often did your partner scream or curse at you?: 1     PHYSICAL EXAM  GENERAL EXAM/CONSTITUTIONAL: Vitals:  Vitals:   04/02/23 1127  BP: (!) 148/103  Pulse: (!) 56  Weight: 146 lb 8 oz (66.5 kg)  Height: 5\' 2"  (1.575 m)   Body mass index is 26.8 kg/m. Wt Readings from Last 3 Encounters:  04/02/23 146 lb 8 oz (66.5 kg)  03/08/23 146 lb 12.8 oz (66.6 kg)  01/24/23 147 lb (66.7 kg)    NEUROLOGIC: MENTAL STATUS:  awake, alert, oriented to person, place and time recent and remote memory intact normal attention and concentration language fluent, comprehension intact, naming intact fund of knowledge appropriate  CRANIAL NERVE:  2nd, 3rd, 4th, 6th - pupils equal and reactive to light, visual field cut LLQ OD (baseline), visual fields intact OS, extraocular muscles intact, no nystagmus 5th - facial sensation symmetric 7th - facial strength symmetric 8th - hearing intact 9th - palate elevates symmetrically, uvula midline 11th - shoulder shrug symmetric 12th - tongue protrusion midline  MOTOR:  normal bulk and tone, full strength in the BUE, BLE  SENSORY:  normal and symmetric to light touch all 4 extremities  COORDINATION:  finger-nose-finger, fine finger movements normal  REFLEXES:  deep tendon reflexes present and symmetric  GAIT/STATION:  normal     DIAGNOSTIC DATA (LABS, IMAGING, TESTING) - I reviewed patient records, labs, notes, testing and imaging myself where available.  Lab Results  Component Value Date   WBC 6.1 01/24/2023   HGB 9.8 (L) 01/24/2023   HCT 30.3 (L) 01/24/2023   MCV 91.5 01/24/2023   PLT 259 01/24/2023       Component Value Date/Time   NA 135 12/11/2022 1328   NA 139 08/02/2017 0821   K 4.3 12/11/2022 1328   K 4.3 08/02/2017 0821   CL 101 12/11/2022 1328   CO2 24 12/11/2022 1328   CO2 24 08/02/2017 0821   GLUCOSE 106 (H) 12/11/2022 1328   GLUCOSE 100 08/02/2017 0821   BUN 39 (H) 12/11/2022 1328   BUN 14.4 08/02/2017 0821   CREATININE 1.76 (H) 12/11/2022 1328   CREATININE 1.69 (H) 05/30/2022 0943   CREATININE 1.3 (H) 08/02/2017 0821   CALCIUM 9.1 12/11/2022 1328   CALCIUM 9.2 08/02/2017 0821   PROT 6.8 09/10/2022 0825   PROT 7.1 08/02/2017 0821   ALBUMIN 3.5 09/10/2022 0825   ALBUMIN 3.5 08/02/2017 0821   AST 20 09/10/2022 0825   AST 14 (L) 05/30/2022 0943   AST 14 08/02/2017 0821   ALT 10 09/10/2022 0825   ALT 6 05/30/2022 0943   ALT 8 08/02/2017 0821   ALKPHOS 60 09/10/2022 0825   ALKPHOS 66 08/02/2017 0821   BILITOT 0.5 09/10/2022 0825   BILITOT 0.3 05/30/2022 0943   BILITOT 0.34 08/02/2017 0821   GFRNONAA 29 (L) 12/11/2022 1328   GFRNONAA 31 (L) 05/30/2022 0943   GFRAA 34 (L) 02/26/2020 0842   Lab Results  Component Value Date   CHOL 150 11/19/2021   HDL 40 (L) 11/19/2021   LDLCALC 88 11/19/2021   TRIG 109 11/19/2021   CHOLHDL 3.8 11/19/2021   Lab Results  Component Value Date   HGBA1C 5.6 11/19/2021   Lab Results  Component Value Date   VITAMINB12 239 07/03/2020   Lab Results  Component Value Date   TSH 2.910 10/17/2020      ASSESSMENT AND PLAN  82 y.o. 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 MRI/MRA in April were negative for acute process. Will order [employer] as she reports worsening of headache following a head trauma this morning. Her symptoms are most consistent with migraine with aura. She cannot take triptans or NSAIDs due to her medical comorbidities. Will start Nurtec for migraine  rescue. If she tolerates this well, may consider taking it every other day for prevention.   1. Traumatic injury of head, sequela      PLAN: -CTH -Start Nurtec 75 mg PRN. If effective may consider taking every other day for prevention. Will need to monitor kidney function as it has not been  studied in patients with CrCl<15 (current CrCl 27)  Orders Placed This Encounter  Procedures   CT HEAD WO CONTRAST ( )    Meds ordered this encounter  Medications   Rimegepant Sulfate (NURTEC) 75 MG TBDP    Sig: Take 1 tablet (75 mg total) by mouth as needed.    Dispense:  8 tablet    Refill:  6    Return in about 6 months (around 10/03/2023).    Ocie Doyne, MD 04/02/23 12:18 PM  I spent an average of 38 minutes chart reviewing and counseling the patient, with at least 50% of the time face to face with the patient.   [facility] [address] [place], [place] [address] [phone]

## 2023-04-02 NOTE — Telephone Encounter (Signed)
[employer] medicare NPR sent to [employer] [phone]

## 2023-04-02 NOTE — Patient Instructions (Signed)
Consider nurtec

## 2023-04-03 ENCOUNTER — Ambulatory Visit (HOSPITAL_COMMUNITY)
Admission: RE | Admit: 2023-04-03 | Discharge: 2023-04-03 | Disposition: A | Discharge status: Home / Self Care | Payer: 59 | Source: Ambulatory Visit | Attending: Psychiatry | Admitting: Psychiatry

## 2023-04-03 DIAGNOSIS — S0990XS Unspecified injury of head, sequela: Secondary | ICD-10-CM | POA: Insufficient documentation

## 2023-04-08 ENCOUNTER — Telehealth: Payer: Self-pay

## 2023-04-08 ENCOUNTER — Encounter: Payer: Self-pay | Admitting: Physician Assistant

## 2023-04-08 ENCOUNTER — Other Ambulatory Visit (HOSPITAL_COMMUNITY): Payer: Self-pay

## 2023-04-08 NOTE — Telephone Encounter (Signed)
Pharmacy Patient Advocate Encounter   Received notification from CoverMyMeds that prior authorization for Nurtec 75MG  dispersible tablets is required/requested.   Insurance verification completed.   The patient is insured through [employer] .   Per test claim: PA required; PA submitted to [facility] via CoverMyMeds Key/confirmation #/EOC [id number] Status is pending

## 2023-04-10 ENCOUNTER — Other Ambulatory Visit (HOSPITAL_COMMUNITY): Payer: Self-pay

## 2023-04-10 NOTE — Telephone Encounter (Signed)
Pharmacy Patient Advocate Encounter  Received notification from [facility] that Prior Authorization for Nurtec 75MG  dispersible tablets has been APPROVED from 04/08/2023 to 08/13/2023. Ran test claim, Copay is $0. This test claim was processed through [facility]- copay amounts may vary at other pharmacies due to pharmacy/plan contracts, or as the patient moves through the different stages of their insurance plan.   PA #/Case ID/Reference #: PA Case ID #: [id number]

## 2023-05-01 ENCOUNTER — Encounter: Payer: Self-pay | Admitting: Pulmonary Disease

## 2023-05-08 ENCOUNTER — Emergency Department (HOSPITAL_BASED_OUTPATIENT_CLINIC_OR_DEPARTMENT_OTHER): Payer: 59

## 2023-05-08 ENCOUNTER — Emergency Department (HOSPITAL_BASED_OUTPATIENT_CLINIC_OR_DEPARTMENT_OTHER)
Admission: EM | Admit: 2023-05-08 | Discharge: 2023-05-08 | Disposition: A | Discharge status: Home / Self Care | Payer: 59 | Attending: Emergency Medicine | Admitting: Emergency Medicine

## 2023-05-08 ENCOUNTER — Other Ambulatory Visit: Payer: Self-pay

## 2023-05-08 ENCOUNTER — Other Ambulatory Visit (HOSPITAL_BASED_OUTPATIENT_CLINIC_OR_DEPARTMENT_OTHER): Payer: Self-pay

## 2023-05-08 DIAGNOSIS — I10 Essential (primary) hypertension: Secondary | ICD-10-CM | POA: Insufficient documentation

## 2023-05-08 DIAGNOSIS — Z91148 Patient's other noncompliance with medication regimen for other reason: Secondary | ICD-10-CM | POA: Diagnosis not present

## 2023-05-08 DIAGNOSIS — R42 Dizziness and giddiness: Secondary | ICD-10-CM | POA: Diagnosis not present

## 2023-05-08 DIAGNOSIS — R202 Paresthesia of skin: Secondary | ICD-10-CM | POA: Insufficient documentation

## 2023-05-08 DIAGNOSIS — Z79899 Other long term (current) drug therapy: Secondary | ICD-10-CM | POA: Insufficient documentation

## 2023-05-08 DIAGNOSIS — R791 Abnormal coagulation profile: Secondary | ICD-10-CM | POA: Diagnosis not present

## 2023-05-08 LAB — COMPREHENSIVE METABOLIC PANEL
ALT: 8 U/L (ref 0–44)
AST: 15 U/L (ref 15–41)
Albumin: 4.2 g/dL (ref 3.5–5.0)
Alkaline Phosphatase: 63 U/L (ref 38–126)
Anion gap: 10 (ref 5–15)
BUN: 29 mg/dL — ABNORMAL HIGH (ref 8–23)
CO2: 25 mmol/L (ref 22–32)
Calcium: 9.5 mg/dL (ref 8.9–10.3)
Chloride: 102 mmol/L (ref 98–111)
Creatinine, Ser: 1.88 mg/dL — ABNORMAL HIGH (ref 0.44–1.00)
GFR, Estimated: 27 mL/min — ABNORMAL LOW (ref >60)
Glucose, Bld: 125 mg/dL — ABNORMAL HIGH (ref 70–99)
Potassium: 4.6 mmol/L (ref 3.5–5.1)
Sodium: 137 mmol/L (ref 135–145)
Total Bilirubin: 0.4 mg/dL (ref 0.3–1.2)
Total Protein: 7.3 g/dL (ref 6.5–8.1)

## 2023-05-08 LAB — PROTIME-INR
INR: 1 (ref 0.8–1.2)
Prothrombin Time: 13.2 seconds (ref 11.4–15.2)

## 2023-05-08 LAB — DIFFERENTIAL
Abs Immature Granulocytes: 0.02 10*3/uL (ref 0.00–0.07)
Basophils Absolute: 0.1 10*3/uL (ref 0.0–0.1)
Basophils Relative: 1 %
Eosinophils Absolute: 0.3 10*3/uL (ref 0.0–0.5)
Eosinophils Relative: 5 %
Immature Granulocytes: 0 %
Lymphocytes Relative: 29 %
Lymphs Abs: 2 10*3/uL (ref 0.7–4.0)
Monocytes Absolute: 0.5 10*3/uL (ref 0.1–1.0)
Monocytes Relative: 7 %
Neutro Abs: 4 10*3/uL (ref 1.7–7.7)
Neutrophils Relative %: 58 %

## 2023-05-08 LAB — CBC
HCT: 29.2 % — ABNORMAL LOW (ref 36.0–46.0)
Hemoglobin: 9.3 g/dL — ABNORMAL LOW (ref 12.0–15.0)
MCH: 29.2 pg (ref 26.0–34.0)
MCHC: 31.8 g/dL (ref 30.0–36.0)
MCV: 91.8 fL (ref 80.0–100.0)
Platelets: 300 10*3/uL (ref 150–400)
RBC: 3.18 MIL/uL — ABNORMAL LOW (ref 3.87–5.11)
RDW: 13.5 % (ref 11.5–15.5)
WBC: 6.9 10*3/uL (ref 4.0–10.5)
nRBC: 0 % (ref 0.0–0.2)

## 2023-05-08 LAB — APTT: aPTT: 30 seconds (ref 24–36)

## 2023-05-08 LAB — CBG MONITORING, ED: Glucose-Capillary: 122 mg/dL — ABNORMAL HIGH (ref 70–99)

## 2023-05-08 NOTE — ED Provider Notes (Signed)
[place] EMERGENCY DEPARTMENT AT [facility] Provider Note   CSN: [id number] Arrival date & time: 05/08/23  1121     History  Chief Complaint  Patient presents with   Numbness    [name] is a 82 y.o. female.  The history is provided by the patient.  Patient with extensive history including hypertension, previous TIA presents with neurologic symptoms Patient reports she woke up in her usual state of health around 8 AM.  Around 1 hour later she started feeling her right eye was "drawling "and she had numbness inside of her right mouth.  No arm or leg numbness or weakness.  No slurred speech.  She reported having mild dizziness but that is improved. Denies any new visual changes.  No new headache. She reports medication compliance   Family at bedside confirms she had no slurred speech or obvious weakness Home Medications Prior to Admission medications   Medication Sig Start Date End Date Taking? Authorizing Provider  amiodarone (PACERONE) 200 MG tablet Take 100 mg by mouth daily. 04/03/23  Yes [provider]  ezetimibe (ZETIA) 10 MG tablet Take 10 mg by mouth daily. 04/16/23  Yes [provider]  acetaminophen (TYLENOL) 500 MG tablet Take 500-1,000 mg by mouth every 6 (six) hours as needed (for pain).    [provider]  albuterol (VENTOLIN HFA) 108 (90 Base) MCG/ACT inhaler Inhale 2 puffs into the lungs every 6 (six) hours as needed for wheezing or shortness of breath. 05/25/20   Icard, Rachel Bo, DO  atorvastatin (LIPITOR) 40 MG tablet Take 1 tablet (40 mg total) by mouth at bedtime. 09/29/21   Rhyne, Ames Coupe, PA-C  cetirizine (ZYRTEC) 10 MG tablet Take 10 mg by mouth daily as needed for allergies.     [provider]  clopidogrel (PLAVIX) 75 MG tablet Take 1 tablet (75 mg total) by mouth daily. Patient taking differently: Take 37.5 mg by mouth in the morning. 07/08/20   Almon Hercules, MD  dexlansoprazole (DEXILANT) 60 MG capsule  Take 1 capsule by mouth daily.    [provider]  fluorouracil (EFUDEX) 5 % cream Apply topically. 04/25/22   [provider]  furosemide (LASIX) 40 MG tablet Take 40 mg by mouth in the morning. 01/30/21   [provider]  lidocaine (LIDODERM) 5 % Place 1 patch onto the skin daily. Remove & Discard patch within 12 hours or as directed by MD 09/10/22   Netta Corrigan, PA-C  Magnesium Oxide 400 MG CAPS Take 400 mg by mouth in the morning.    [provider]  nitroGLYCERIN (NITROSTAT) 0.4 MG SL tablet Place 1 tablet (0.4 mg total) under the tongue every 5 (five) minutes x 3 doses as needed for chest pain. 08/01/22   Cobb, Ruby Cola, NP  olmesartan (BENICAR) 20 MG tablet Take 1 tablet (20 mg total) by mouth daily. Patient taking differently: Take 10 mg by mouth in the morning. 07/04/20   Almon Hercules, MD  ondansetron (ZOFRAN-ODT) 4 MG disintegrating tablet Take 4 mg by mouth every 8 (eight) hours as needed for nausea or vomiting (dissolve in the mouth). 03/29/21   [provider]  pantoprazole (PROTONIX) 40 MG tablet TAKE 1 TABLET BY MOUTH TWICE A DAY 08/24/22   Cobb, Ruby Cola, NP  Rimegepant Sulfate (NURTEC) 75 MG TBDP Take 1 tablet (75 mg total) by mouth as needed. 04/02/23   Ocie Doyne, MD  TRELEGY ELLIPTA 200-62.5-25 MCG/ACT AEPB USE 1 INHALATION  BY MOUTH DAILY 02/26/23   Icard, Rachel Bo, DO      Allergies    Avelox [moxifloxacin hcl in nacl], Azithromycin, Codeine, Doxycycline, Hydromorphone, Levaquin [levofloxacin], Vitamin d analogs, Nifedipine er, Octreotide, Oxycodone-acetaminophen, and Risedronate    Review of Systems   Review of Systems  Constitutional:  Negative for fever.  Eyes:  Negative for visual disturbance.  Neurological:  Positive for dizziness. Negative for speech difficulty, weakness and headaches.    Physical Exam Updated Vital Signs BP (!) 151/25   Pulse (!) 47   Temp 98.4 F (36.9 C) (Oral)   Resp (!) 21   SpO2  99%  Physical Exam CONSTITUTIONAL: Well developed/well nourished HEAD: Normocephalic/atraumatic EYES: EOMI no nystagmus, no ptosis, blind in right eye ENMT: Mucous membranes moist NECK: supple no meningeal signs, no bruits CV: S1/S2 noted, no murmurs/rubs/gallops noted LUNGS: Lungs are clear to auscultation bilaterally, no apparent distress ABDOMEN: soft, nontender, no rebound or guarding GU:no cva tenderness NEURO:Awake/alert, face symmetric, no arm or leg drift is noted Equal 5/5 strength with shoulder abduction, elbow flex/extension, wrist flex/extension in upper extremities and equal hand grips bilaterally Equal 5/5 strength with hip flexion,knee flex/extension, foot dorsi/plantar flexion Cranial nerves 3/4/5/6/02/18/09/11/12 tested and intact No past pointing Sensation to light touch intact in all extremities NIHSS=0 EXTREMITIES: pulses normal, full ROM SKIN: warm, color normal PSYCH: no abnormalities of mood noted  ED Results / Procedures / Treatments   Labs (all labs ordered are listed, but only abnormal results are displayed) Labs Reviewed  CBC - Abnormal; Notable for the following components:      Result Value   RBC 3.18 (*)    Hemoglobin 9.3 (*)    HCT 29.2 (*)    All other components within normal limits  COMPREHENSIVE METABOLIC PANEL - Abnormal; Notable for the following components:   Glucose, Bld 125 (*)    BUN 29 (*)    Creatinine, Ser 1.88 (*)    GFR, Estimated 27 (*)    All other components within normal limits  CBG MONITORING, ED - Abnormal; Notable for the following components:   Glucose-Capillary 122 (*)    All other components within normal limits  PROTIME-INR  APTT  DIFFERENTIAL    EKG EKG Interpretation Date/Time:  Wednesday May 08 2023 11:31:38 EDT Ventricular Rate:  61 PR Interval:  178 QRS Duration:  86 QT Interval:  392 QTC Calculation: 395 R Axis:   68  Text Interpretation: Sinus rhythm Confirmed by Zadie Rhine ([id number]) on  05/08/2023 11:54:03 AM  Radiology CT HEAD WO CONTRAST  Result Date: 05/08/2023 CLINICAL DATA:  Headache with facial numbness EXAM: CT HEAD WITHOUT CONTRAST TECHNIQUE: Contiguous axial images were obtained from the base of the skull through the vertex without intravenous contrast. RADIATION DOSE REDUCTION: This exam was performed according to the departmental dose-optimization program which includes automated exposure control, adjustment of the mA and/or kV according to patient size and/or use of iterative reconstruction technique. COMPARISON:  Head CT 04/03/2023 FINDINGS: Brain: No evidence of acute infarction, hemorrhage, hydrocephalus, extra-axial collection or mass lesion/mass effect. Mild generalized cerebral volume loss Vascular: No hyperdense vessel or unexpected calcification. Skull: Unremarkable suboccipital craniectomy.  No acute finding Sinuses/Orbits: Mild sinus opacification similar to prior and seen at the ethmoid and right inferior frontal sinuses. Symmetric mastoid sclerosis without opacification. IMPRESSION: 1.  No acute or interval finding. 2. Chronic frontoethmoidal sinus opacification. Electronically Signed   By: Tiburcio Pea M.D.   On: 05/08/2023 12:34    Procedures Procedures  Medications Ordered in ED Medications - No data to display  ED Course/ Medical Decision Making/ A&P Clinical Course as of 05/08/23 1317  Wed May 08, 2023  1154 Hemoglobin(!): 9.3 Chronic anemia [DW]  1234 Creatinine(!): 1.88 Chronic renal insufficiency [DW]  1234 Patient seen on arrival for concern for CVA.  NIH stroke scale is 0 and no focal deficits.  However she does have a history of TIA.  Will consult teleneurology [DW]  1316 Overall patient is well appearing.  Workup in the ER is unremarkable or at her baseline She ambulated without difficulty.  No new weakness.  I did discuss with teleneurology, see their note for details.  However this appears to be an ongoing/chronic issue for patient.   Patient admits that this is not a new process has had multiple times previously.  She is well-known to outpatient neurology.  She is already on Plavix.  Patient is feeling improved and would like to be discharged home.  This time we will defer advanced imaging such as MRI. [DW]  1317 We discussed her ER return precautions with patient and her son [DW]    Clinical Course User Index [DW] Zadie Rhine, MD                                 Medical Decision Making Amount and/or Complexity of Data Reviewed Labs: ordered. Decision-making details documented in ED Course. Radiology: ordered.   This patient presents to the ED for concern of neurologic complaint/numbness, this involves an extensive number of treatment options, and is a complaint that carries with it a high risk of complications and morbidity.  The differential diagnosis includes but is not limited to CVA, intracranial hemorrhage, acute coronary syndrome, renal failure, urinary tract infection, electrolyte disturbance, pneumonia   Comorbidities that complicate the patient evaluation: Patient's presentation is complicated by their history of TIA and hypertension   Additional history obtained: Additional history obtained from family Records reviewed previous admission documents  Lab Tests: I Ordered, and personally interpreted labs.  The pertinent results include: Chronic anemia, chronic renal insufficiency  Imaging Studies ordered: I ordered imaging studies including CT scan head   I independently visualized and interpreted imaging which showed no acute findings I agree with the radiologist interpretation  Cardiac Monitoring: The patient was maintained on a cardiac monitor.  I personally viewed and interpreted the cardiac monitor which showed an underlying rhythm of:  sinus rhythm   Test Considered: Considered MRI, the patient is back to baseline has had these episodes multiple times previously with negative MRIs will  defer for now   Consultations Obtained: I requested consultation with the consultant neurology , and discussed  findings as well as pertinent plan - they recommend: if patient is  back to baseline can follow-up as an outpatient  Reevaluation: After the interventions noted above, I reevaluated the patient and found that they have :improved  Complexity of problems addressed: Patient's presentation is most consistent with  acute presentation with potential threat to life or bodily function  Disposition: After consideration of the diagnostic results and the patient's response to treatment,  I feel that the patent would benefit from discharge   .           Final Clinical Impression(s) / ED Diagnoses Final diagnoses:  Paresthesia    Rx / DC Orders ED Discharge Orders          Ordered    Ambulatory  referral to Neurology       Comments: An appointment is requested in approximately: 2 weeks Pt known patient to [facility] but needs closer f/u   05/08/23 1316              Zadie Rhine, MD 05/08/23 1319

## 2023-05-08 NOTE — ED Notes (Signed)
Patient ambulated in hallway with stand by assist.  Ambulated without difficulty Normally uses walker at home

## 2023-05-08 NOTE — Plan of Care (Addendum)
Discussed with Dr. Bebe Shaggy, per his examination no objective neurological deficits but patient complaining of some drawing of her right eye, numbness and mild dizziness that has improved.  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  I recommend shared decision making with the patient, if she feels that this is substantially different from prior events and she needs an MRI to rule out an acute intracranial process that is certainly reasonable given her risk factors.  However given her minimal / nondisabling deficits and at least chart report of very similar presentations in the past, if she does think this is attributable to her typical headache related symptoms, close outpatient follow-up with Dr. Delena Bali would certainly be appropriate

## 2023-05-16 ENCOUNTER — Telehealth: Payer: Self-pay | Admitting: Medical Oncology

## 2023-05-16 NOTE — Telephone Encounter (Signed)
Weak / breaks out in a sweat and feels like she is going to pass out after 15 mins of walking in [place], bank, grocery store.   She reports some SOB at the time.  She lays down for the rest of the day.   She denies blood in stool , but sometimes they are dark. Hgb yesterday at PCP appt was 9.3 . Iron studies not back. Her PCP said to call the cancer center.

## 2023-05-16 NOTE — Telephone Encounter (Signed)
I told pt we will move up her labs and scan and she will get a call with the scan and labs and f/u visit with [facility] and to expect 2 calls . Wednesday is good for her. Schedule message sent and radiology notified.

## 2023-05-22 ENCOUNTER — Telehealth: Payer: Self-pay | Admitting: Internal Medicine

## 2023-05-22 NOTE — Telephone Encounter (Signed)
Called patient regarding October appointments, patient is notified.

## 2023-05-23 ENCOUNTER — Ambulatory Visit (HOSPITAL_COMMUNITY)
Admission: RE | Admit: 2023-05-23 | Discharge: 2023-05-23 | Disposition: A | Discharge status: Home / Self Care | Payer: 59 | Source: Ambulatory Visit | Attending: Internal Medicine | Admitting: Internal Medicine

## 2023-05-23 ENCOUNTER — Inpatient Hospital Stay: Payer: 59 | Attending: Internal Medicine

## 2023-05-23 DIAGNOSIS — N189 Chronic kidney disease, unspecified: Secondary | ICD-10-CM | POA: Diagnosis not present

## 2023-05-23 DIAGNOSIS — C3431 Malignant neoplasm of lower lobe, right bronchus or lung: Secondary | ICD-10-CM | POA: Diagnosis present

## 2023-05-23 DIAGNOSIS — C3490 Malignant neoplasm of unspecified part of unspecified bronchus or lung: Secondary | ICD-10-CM

## 2023-05-23 DIAGNOSIS — D509 Iron deficiency anemia, unspecified: Secondary | ICD-10-CM | POA: Insufficient documentation

## 2023-05-23 DIAGNOSIS — R252 Cramp and spasm: Secondary | ICD-10-CM | POA: Diagnosis not present

## 2023-05-23 DIAGNOSIS — D638 Anemia in other chronic diseases classified elsewhere: Secondary | ICD-10-CM | POA: Diagnosis not present

## 2023-05-23 LAB — CMP (CANCER CENTER ONLY)
ALT: 7 U/L (ref 0–44)
AST: 14 U/L — ABNORMAL LOW (ref 15–41)
Albumin: 4.2 g/dL (ref 3.5–5.0)
Alkaline Phosphatase: 71 U/L (ref 38–126)
Anion gap: 8 (ref 5–15)
BUN: 28 mg/dL — ABNORMAL HIGH (ref 8–23)
CO2: 27 mmol/L (ref 22–32)
Calcium: 9.7 mg/dL (ref 8.9–10.3)
Chloride: 105 mmol/L (ref 98–111)
Creatinine: 1.96 mg/dL — ABNORMAL HIGH (ref 0.44–1.00)
GFR, Estimated: 25 mL/min — ABNORMAL LOW (ref >60)
Glucose, Bld: 97 mg/dL (ref 70–99)
Potassium: 4.7 mmol/L (ref 3.5–5.1)
Sodium: 140 mmol/L (ref 135–145)
Total Bilirubin: 0.3 mg/dL (ref 0.3–1.2)
Total Protein: 7.4 g/dL (ref 6.5–8.1)

## 2023-05-23 LAB — CBC WITH DIFFERENTIAL (CANCER CENTER ONLY)
Abs Immature Granulocytes: 0.02 10*3/uL (ref 0.00–0.07)
Basophils Absolute: 0.1 10*3/uL (ref 0.0–0.1)
Basophils Relative: 1 %
Eosinophils Absolute: 0.3 10*3/uL (ref 0.0–0.5)
Eosinophils Relative: 4 %
HCT: 26.9 % — ABNORMAL LOW (ref 36.0–46.0)
Hemoglobin: 8.5 g/dL — ABNORMAL LOW (ref 12.0–15.0)
Immature Granulocytes: 0 %
Lymphocytes Relative: 33 %
Lymphs Abs: 2.7 10*3/uL (ref 0.7–4.0)
MCH: 29.2 pg (ref 26.0–34.0)
MCHC: 31.6 g/dL (ref 30.0–36.0)
MCV: 92.4 fL (ref 80.0–100.0)
Monocytes Absolute: 0.6 10*3/uL (ref 0.1–1.0)
Monocytes Relative: 8 %
Neutro Abs: 4.4 10*3/uL (ref 1.7–7.7)
Neutrophils Relative %: 54 %
Platelet Count: 316 10*3/uL (ref 150–400)
RBC: 2.91 MIL/uL — ABNORMAL LOW (ref 3.87–5.11)
RDW: 13.2 % (ref 11.5–15.5)
WBC Count: 8.1 10*3/uL (ref 4.0–10.5)
nRBC: 0 % (ref 0.0–0.2)

## 2023-05-23 LAB — IRON AND IRON BINDING CAPACITY (CC-WL,HP ONLY)
Iron: 34 ug/dL (ref 28–170)
Saturation Ratios: 8 % — ABNORMAL LOW (ref 10.4–31.8)
TIBC: 430 ug/dL (ref 250–450)
UIBC: 396 ug/dL (ref 148–442)

## 2023-05-23 NOTE — Progress Notes (Signed)
[facility] OFFICE PROGRESS NOTE  Natalie Harries, MD [address] [place] [address]  DIAGNOSIS:  1) stage IA non-small cell lung cancer, squamous cell carcinoma presented with right lower lobe pulmonary nodule. 2) persistent anemia questionable for anemia of chronic disease +/-iron deficiency.  PRIOR THERAPY: 1) stereotactic radiotherapy to the right lower lobe lung nodule under the care of Dr. Kathrynn Running   CURRENT THERAPY: IV iron as needed with venofer, last dose on 11/09/22.   INTERVAL HISTORY: [name] 82 y.o. female returns to the clinic today for a follow-up visit accompanied by her daughter. The patient was last seen by Dr. Arbutus Ped on 01/24/2023.  The patient is followed for her history of stage Ia lung cancer.  She also has anemia secondary to anemia of chronic disease and GI bleeding.  She is followed by Dr. Elnoria Howard from GI and she has a history of AVMs. She previously had previously underwent capsule endoscopy which was negative. She was recently hospitalized from 10/11-10/14 for anemia and dark black stools. She received 1 unit of blood while admitted. She had iron studies which showed low saturation and ferritin of 6. She is not able to tolerate oral iron supplements due to them making her lip swell. She is scheduled to see Dr. Elnoria Howard outpatient tomorrow.    Leg cramping last night.  She has some question about her outpatient medications since being discharged in the hospital.  She resumed taking her magnesium.  He continues to take Plavix.  She continues to have fatigue.  She occasionally had an a secondary to her bradycardia and anemia.  She has chronic dyspnea on exertion.  She also has a chronic cough.  Her PCP had prescribed her, what sounds like is Tessalon.  Besides having the dark stools she denies any other visible bleeding such as nosebleeding, gingival bleeding, hemoptysis, hematemesis, or hematuria.  She does have some easy bruising on her  extremities from recent IVs.  He has frequent nausea for which she uses alcohol pads which apparently helps her nausea.  She denies any vomiting.  Denies any diarrhea or constipation.  She has chronic kidney disease and sees a nephrologist. She recently had a restaging CT scan.  She is here today for evaluation to review her scan results.   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   Diverticulitis    Dyspnea    with exertion   Gastroesophageal reflux disease    occ   Glaucoma    right eye   Headache syndrome 11/27/2018   Hiatal hernia    History of shingles 06/23/2018   Hyperlipidemia    Hypertension    Lung cancer (HCC) dx 2018   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   Myocardial infarction (HCC) 01/01/2000   Cardiac catheterization   Peripheral vascular disease (HCC)    stents in legs x 2 or 3   Pneumonia    last time winter 2017 -2018   PONV (postoperative nausea and vomiting)  occassionally, last colonscopy did ok with anesthesia   Primary cancer of right lower lobe of lung (HCC) 04/25/2016   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  MEDICATIONS:  Current Outpatient Medications  Medication  Sig Dispense Refill   acetaminophen (TYLENOL) 500 MG tablet Take 500-1,000 mg by mouth every 6 (six) hours as needed (for pain).     albuterol (VENTOLIN HFA) 108 (90 Base) MCG/ACT inhaler Inhale 2 puffs into the lungs every 6 (six) hours as needed for wheezing or shortness of breath. 8 g 6   amiodarone (PACERONE) 200 MG tablet Take 100 mg by mouth daily.     atorvastatin (LIPITOR) 40 MG tablet Take 1 tablet (40 mg total) by mouth at bedtime. 30 tablet 6   cetirizine (ZYRTEC) 10 MG tablet Take 10 mg by mouth daily as needed for allergies.      clopidogrel (PLAVIX) 75 MG tablet Take 0.5 tablets (37.5 mg total) by mouth in the morning.     dexlansoprazole (DEXILANT) 60 MG capsule Take 60 mg by mouth daily before breakfast.     fluorouracil (EFUDEX) 5 % cream Apply 1 application  topically See admin instructions. Apply to affected areas of the face once a day     furosemide (LASIX) 40 MG tablet Take 40 mg by mouth in the morning.     magnesium oxide (MAG-OX) 400 (240 Mg) MG tablet Take 400 mg by mouth daily after breakfast.     nitroGLYCERIN (NITROSTAT) 0.4 MG SL tablet Place 1 tablet (0.4 mg total) under the tongue every 5 (five) minutes x 3 doses as needed for chest pain. 10 tablet 0   olmesartan (BENICAR) 20 MG tablet Take 1 tablet (20 mg total) by mouth daily. (Patient taking differently: Take 10 mg by mouth in the morning.) 90 tablet 1   TRELEGY ELLIPTA 200-62.5-25 MCG/ACT AEPB USE 1 INHALATION BY MOUTH DAILY (Patient taking differently: Inhale 1 puff into the lungs daily.) 180 each 3   Rimegepant Sulfate (NURTEC) 75 MG TBDP Take 1 tablet (75 mg total) by mouth as needed. (Patient not taking: Reported on 05/24/2023) 8 tablet 6   No current facility-administered medications for this visit.    SURGICAL HISTORY:  Past Surgical History:  Procedure Laterality Date   ABDOMINAL HYSTERECTOMY     APPENDECTOMY     Arnold-chiari malformation repair  1998   Suboccipital craniectomy   CAROTID  ENDARTERECTOMY  03/29/2010   Left  CEA   CHOLECYSTECTOMY     Gall Bladder   COLONOSCOPY WITH PROPOFOL N/A 04/22/2015   Procedure: COLONOSCOPY WITH PROPOFOL;  Surgeon: Jeani Hawking, MD;  Location: [facility];  Service: Endoscopy;  Laterality: N/A;   COLONOSCOPY WITH PROPOFOL N/A 05/25/2016   Procedure: COLONOSCOPY WITH PROPOFOL;  Surgeon: Jeani Hawking, MD;  Location: [facility];  Service: Endoscopy;  Laterality: N/A;   COLONOSCOPY WITH PROPOFOL N/A 05/03/2017   Procedure: COLONOSCOPY WITH PROPOFOL;  Surgeon: Jeani Hawking, MD;  Location: [facility];  Service: Endoscopy;  Laterality: N/A;   COLONOSCOPY WITH PROPOFOL N/A 06/29/2020   Procedure: COLONOSCOPY WITH PROPOFOL;  Surgeon: Jeani Hawking, MD;  Location: [facility];  Service: Endoscopy;  Laterality: N/A;   COLONOSCOPY WITH PROPOFOL N/A 01/05/2021  Procedure: COLONOSCOPY WITH PROPOFOL;  Surgeon: Jeani Hawking, MD;  Location: [facility];  Service: Endoscopy;  Laterality: N/A;   COLONOSCOPY WITH PROPOFOL N/A 09/28/2021   Procedure: COLONOSCOPY WITH PROPOFOL;  Surgeon: Jeani Hawking, MD;  Location: [facility] ENDOSCOPY;  Service: Endoscopy;  Laterality: N/A;   CORNEAL TRANSPLANT     Right   CORONARY ARTERY BYPASS GRAFT  01/01/2000   x 3   ENDARTERECTOMY Right 09/26/2021   Procedure: RIGHT CAROTID ENDARTERECTOMY;  Surgeon: Chuck Hint, MD;  Location: [facility] OR;  Service: Vascular;  Laterality: Right;   ENTEROSCOPY N/A 02/27/2018   Procedure: ENTEROSCOPY;  Surgeon: Jeani Hawking, MD;  Location: [facility];  Service: Endoscopy;  Laterality: N/A;   ENTEROSCOPY N/A 07/18/2018   Procedure: ENTEROSCOPY;  Surgeon: Jeani Hawking, MD;  Location: [facility];  Service: Endoscopy;  Laterality: N/A;   ENTEROSCOPY N/A 06/29/2020   Procedure: ENTEROSCOPY;  Surgeon: Jeani Hawking, MD;  Location: [facility];  Service: Endoscopy;  Laterality: N/A;   ENTEROSCOPY N/A 12/05/2021   Procedure: ENTEROSCOPY;  Surgeon: Jeani Hawking, MD;  Location: [facility]  ENDOSCOPY;  Service: Gastroenterology;  Laterality: N/A;   ENTEROSCOPY N/A 12/25/2021   Procedure: ENTEROSCOPY;  Surgeon: Jeani Hawking, MD;  Location: [facility];  Service: Gastroenterology;  Laterality: N/A;  with endoscopic placement of video capsule   ESOPHAGOGASTRODUODENOSCOPY N/A 05/25/2016   Procedure: ESOPHAGOGASTRODUODENOSCOPY (EGD);  Surgeon: Jeani Hawking, MD;  Location: [facility] ENDOSCOPY;  Service: Endoscopy;  Laterality: N/A;   ESOPHAGOGASTRODUODENOSCOPY N/A 08/01/2018   Procedure: ESOPHAGOGASTRODUODENOSCOPY (EGD);  Surgeon: Jeani Hawking, MD;  Location: [facility] ENDOSCOPY;  Service: Endoscopy;  Laterality: N/A;   ESOPHAGOGASTRODUODENOSCOPY (EGD) WITH PROPOFOL N/A 09/28/2021   Procedure: ESOPHAGOGASTRODUODENOSCOPY (EGD) WITH PROPOFOL;  Surgeon: Jeani Hawking, MD;  Location[facility]COPY;  Service: Endoscopy;  Laterality: N/A;   EYE SURGERY Right 1995 or 1996   Laser surgery for retinal hemorrhage   GIVENS CAPSULE STUDY N/A 07/16/2018   Procedure: GIVENS CAPSULE STUDY;  Surgeon: Jeani Hawking, MD;  Location: [facility];  Service: Endoscopy;  Laterality: N/A;   GIVENS CAPSULE STUDY N/A 12/14/2021   Procedure: GIVENS CAPSULE STUDY;  Surgeon: Jeani Hawking, MD;  Location: [facility] ENDOSCOPY;  Service: Gastroenterology;  Laterality: N/A;   GIVENS CAPSULE STUDY N/A 12/25/2021   Procedure: GIVENS CAPSULE STUDY;  Surgeon: Jeani Hawking, MD;  Location: [facility];  Service: Gastroenterology;  Laterality: N/A;   HEMOSTASIS CLIP PLACEMENT  06/29/2020   Procedure: HEMOSTASIS CLIP PLACEMENT;  Surgeon: Jeani Hawking, MD;  Location: [facility];  Service: Endoscopy;;   HEMOSTASIS CLIP PLACEMENT  01/05/2021   Procedure: HEMOSTASIS CLIP PLACEMENT;  Surgeon: Jeani Hawking, MD;  Location: [facility];  Service: Endoscopy;;   HOT HEMOSTASIS N/A 02/27/2018   Procedure: HOT HEMOSTASIS (ARGON PLASMA COAGULATION/BICAP);  Surgeon: Jeani Hawking, MD;  Location: [facility] ENDOSCOPY;  Service: Endoscopy;  Laterality: N/A;   HOT  HEMOSTASIS N/A 08/01/2018   Procedure: HOT HEMOSTASIS (ARGON PLASMA COAGULATION/BICAP);  Surgeon: Jeani Hawking, MD;  Location: [facility] ENDOSCOPY;  Service: Endoscopy;  Laterality: N/A;   HOT HEMOSTASIS N/A 06/29/2020   Procedure: HOT HEMOSTASIS (ARGON PLASMA COAGULATION/BICAP);  Surgeon: Jeani Hawking, MD;  Location: [facility] ENDOSCOPY;  Service: Endoscopy;  Laterality: N/A;   HOT HEMOSTASIS N/A 01/05/2021   Procedure: HOT HEMOSTASIS (ARGON PLASMA COAGULATION/BICAP);  Surgeon: Jeani Hawking, MD;  Location: [facility] ENDOSCOPY;  Service: Endoscopy;  Laterality: N/A;   HOT HEMOSTASIS N/A 12/05/2021   Procedure: HOT HEMOSTASIS (ARGON PLASMA COAGULATION/BICAP);  Surgeon: Jeani Hawking, MD;  Location: [facility] ENDOSCOPY;  Service: Gastroenterology;  Laterality: N/A;  IR RADIOLOGIST EVAL & MGMT  12/14/2016   IR RADIOLOGIST EVAL & MGMT  07/26/2021   LEFT HEART CATH AND CORS/GRAFTS ANGIOGRAPHY N/A 01/09/2018   Procedure: LEFT HEART CATH AND CORS/GRAFTS ANGIOGRAPHY;  Surgeon: Rinaldo Cloud, MD;  Location: [facility] INVASIVE CV LAB;  Service: Cardiovascular;  Laterality: N/A;   LEFT HEART CATHETERIZATION WITH CORONARY ANGIOGRAM N/A 08/03/2014   Procedure: LEFT HEART CATHETERIZATION WITH CORONARY ANGIOGRAM;  Surgeon: Ricki Rodriguez, MD;  Location: [facility] CATH LAB;  Service: Cardiovascular;  Laterality: N/A;   PATCH ANGIOPLASTY Right 09/26/2021   Procedure: PATCH ANGIOPLASTY RIGHT CAROTID;  Surgeon: Chuck Hint, MD;  Location: [facility] OR;  Service: Vascular;  Laterality: Right;   Post Coronary Artery  BPG  01/05/2000   Right jugular sheath removed   PR VEIN BYPASS GRAFT,AORTO-FEM-POP     ROTATOR CUFF REPAIR     Right    REVIEW OF SYSTEMS:   Review of Systems  Constitutional: Positive for fatigue. for appetite change, chills, fever and unexpected weight change.  HENT: Negative for mouth sores, nosebleeds, sore throat and trouble swallowing.   Eyes: Negative for eye problems and icterus.  Respiratory: Positive for dyspnea on exertion  and chronic cough. Negative for  hemoptysis and wheezing.   Cardiovascular: Negative for chest pain and leg swelling.  Gastrointestinal: Positive for frequent nausea. Stools dark brown. Negative for abdominal pain, constipation, diarrhea, and vomiting.  Genitourinary: Negative for bladder incontinence, difficulty urinating, dysuria, frequency and hematuria.   Musculoskeletal: Positive for leg muscle cramping. Negative for back pain, gait problem, neck pain and neck stiffness.  Skin: Negative for itching and rash.  Neurological: Positive for lightheadedness and headaches. Negative for dizziness, extremity weakness, gait problem, and seizures.  Hematological: Negative for adenopathy. Does not bruise/bleed easily.  Psychiatric/Behavioral: Negative for confusion, depression and sleep disturbance. The patient is not nervous/anxious.     PHYSICAL EXAMINATION:  Blood pressure (!) 169/50, pulse (!) 50, temperature (!) 97.5 F (36.4 C), resp. rate 19, height 5\' 2"  (1.575 m), weight 145 lb (65.8 kg), SpO2 98%.  ECOG PERFORMANCE STATUS: 2  Physical Exam  Constitutional: Oriented to person, place, and time and well-developed, well-nourished, and in no distress.  HENT:  Head: Normocephalic and atraumatic.  Mouth/Throat: Oropharynx is clear and moist. No oropharyngeal exudate.  Eyes: Conjunctivae are normal. Right eye exhibits no discharge. Left eye exhibits no discharge. No scleral icterus.  Neck: Normal range of motion. Neck supple.  Cardiovascular: Normal rate, regular rhythm, normal heart sounds and intact distal pulses.   Pulmonary/Chest: Effort normal and breath sounds normal. No respiratory distress. No wheezes. No rales.  Abdominal: Soft. Bowel sounds are normal. Exhibits no distension and no mass. There is no tenderness.  Musculoskeletal: Normal range of motion. Exhibits no edema.  Lymphadenopathy:    No cervical adenopathy.  Neurological: Alert and oriented to person, place, and time.  Exhibits muscle wasting.  Skin: Positive for bruising on extremities. Skin is warm and dry. No rash noted. Not diaphoretic. No erythema. No pallor.  Psychiatric: Mood, memory and judgment normal.  Vitals reviewed.  LABORATORY DATA: Lab Results  Component Value Date   WBC 6.3 05/27/2023   HGB 9.3 (L) 05/27/2023   HCT 30.1 (L) 05/27/2023   MCV 92.0 05/27/2023   PLT 262 05/27/2023      Chemistry      Component Value Date/Time   NA 138 05/27/2023 0530   NA 139 08/02/2017 0821   K 4.5 05/27/2023 0530   K 4.3 08/02/2017 9518  CL 104 05/27/2023 0530   CO2 25 05/27/2023 0530   CO2 24 08/02/2017 0821   BUN 26 (H) 05/27/2023 0530   BUN 14.4 08/02/2017 0821   CREATININE 2.00 (H) 05/27/2023 0530   CREATININE 1.96 (H) 05/23/2023 1510   CREATININE 1.3 (H) 08/02/2017 0821      Component Value Date/Time   CALCIUM 8.9 05/27/2023 0530   CALCIUM 9.2 08/02/2017 0821   ALKPHOS 62 05/24/2023 1325   ALKPHOS 66 08/02/2017 0821   AST 17 05/24/2023 1325   AST 14 (L) 05/23/2023 1510   AST 14 08/02/2017 0821   ALT 11 05/24/2023 1325   ALT 7 05/23/2023 1510   ALT 8 08/02/2017 0821   BILITOT 0.5 05/24/2023 1325   BILITOT 0.3 05/23/2023 1510   BILITOT 0.34 08/02/2017 0821       RADIOGRAPHIC STUDIES:  CT Chest Wo Contrast  Result Date: 05/28/2023 CLINICAL DATA:  Non-small-cell lung cancer. Restaging. * Tracking Code: BO * EXAM: CT CHEST WITHOUT CONTRAST TECHNIQUE: Multidetector CT imaging of the chest was performed following the standard protocol without IV contrast. RADIATION DOSE REDUCTION: This exam was performed according to the departmental dose-optimization program which includes automated exposure control, adjustment of the mA and/or kV according to patient size and/or use of iterative reconstruction technique. COMPARISON:  05/23/2022 FINDINGS: Cardiovascular: The heart size is normal. No substantial pericardial effusion. Coronary artery calcification is evident. Moderate  atherosclerotic calcification is noted in the wall of the thoracic aorta. Status post CABG. Mediastinum/Nodes: No mediastinal lymphadenopathy. No evidence for gross hilar lymphadenopathy although assessment is limited by the lack of intravenous contrast on the current study. The esophagus has normal imaging features. There is no axillary lymphadenopathy. Lungs/Pleura: Stable biapical pleuroparenchymal scarring. Calcified anterior right lung nodule is stable, compatible with granuloma. Masslike consolidative opacity in the retro hilar right lung measured previously at 3.0 x 1.9 cm is 2.8 x 1.9 cm today on image 82/8. No new suspicious pulmonary nodule or mass. No focal airspace consolidation. No pleural effusion. Upper Abdomen: Stable small cyst in the lateral segment left liver. Visualized upper abdomen otherwise unremarkable. Musculoskeletal: No worrisome lytic or sclerotic osseous abnormality. Post treatment changes in the posterior right seventh rib are similar to prior. There is a nonacute but un healed fracture in the posterior right eighth rib, new in the interval. IMPRESSION: 1. Stable exam. No new or progressive findings to suggest recurrent or metastatic disease. 2.  Aortic Atherosclerosis (ICD10-I70.0). Electronically Signed   By: Kennith Center M.D.   On: 05/28/2023 10:40   CT HEAD WO CONTRAST  Result Date: 05/08/2023 CLINICAL DATA:  Headache with facial numbness EXAM: CT HEAD WITHOUT CONTRAST TECHNIQUE: Contiguous axial images were obtained from the base of the skull through the vertex without intravenous contrast. RADIATION DOSE REDUCTION: This exam was performed according to the departmental dose-optimization program which includes automated exposure control, adjustment of the mA and/or kV according to patient size and/or use of iterative reconstruction technique. COMPARISON:  Head CT 04/03/2023 FINDINGS: Brain: No evidence of acute infarction, hemorrhage, hydrocephalus, extra-axial collection or  mass lesion/mass effect. Mild generalized cerebral volume loss Vascular: No hyperdense vessel or unexpected calcification. Skull: Unremarkable suboccipital craniectomy.  No acute finding Sinuses/Orbits: Mild sinus opacification similar to prior and seen at the ethmoid and right inferior frontal sinuses. Symmetric mastoid sclerosis without opacification. IMPRESSION: 1.  No acute or interval finding. 2. Chronic frontoethmoidal sinus opacification. Electronically Signed   By: Tiburcio Pea M.D.   On: 05/08/2023 12:34  ASSESSMENT/PLAN:  This is a very pleasant 82 year old Caucasian female diagnosed with stage Ia non-small cell lung cancer, squamous cell carcinoma.  She initially presented with a right lower lobe pulmonary nodule in 2017. She is also followed for IDA.   She underwent SBRT to the lung nodule and she is currently on observation.  She has surveillance CT scans every 4 months.  Patient was seen with Dr. Arbutus Ped today.  Dr. Arbutus Ped personally and independently reviewed the scan and discussed the results with the patient today.  The scan showed no evidence of disease progression.  Dr. Arbutus Ped recommends that she continue on observation with a repeat CT scan in 1 year.   Her CBC, ferritin, and iron studies from the show IDA.  Patient is allergic to iron supplements but not IV iron. We will arrange for IV iron at the [address] infusion center next week with venofer 300 mg weekly x3.   She will come for labs and follow up for her IDA in 6 months per Dr. Arbutus Ped. Of course, if she has labs drawn at one of her providers offices and needs blood transfusion, she knows she can call us sooner. Additionally, if she has worsening symptoms of anemia, she may call us sooner.   She is scheduled to see Dr. Elnoria Howard from GI tomorrow.   I recommended hospital follow up visit with her PCP this week due to her questions about her chronic medications.   She had questions about supplemental oxygen.  Generally, to qualify for supplemental oxygen, she would need to have oxygen 88% or less or evidence of desaturation with ambulation. She states she had this performed at her pulmonary office in the past and did not qualify. Her oxygen is 98% today.    The patient was advised to call immediately if she has any concerning symptoms in the interval. The patient voices understanding of current disease status and treatment options and is in agreement with the current care plan. All questions were answered. The patient knows to call the clinic with any problems, questions or concerns. We can certainly see the patient much sooner if necessary       Orders Placed This Encounter  Procedures   CT CHEST WO CONTRAST    Standing Status:   Future    Standing Expiration Date:   05/27/2024    Order Specific Question:   Preferred imaging location?    Answer:   [facility]   Iron and Iron Binding Capacity ([facility] only)    Standing Status:   Standing    Number of Occurrences:   3    Standing Expiration Date:   05/27/2024   Ferritin    Standing Status:   Standing    Number of Occurrences:   3    Standing Expiration Date:   05/27/2024   CBC with Differential (Cancer Center Only)    Standing Status:   Standing    Number of Occurrences:   3    Standing Expiration Date:   05/27/2024   Sample to Blood Bank    Standing Status:   Standing    Number of Occurrences:   3    Standing Expiration Date:   05/27/2024     Natalie Abraham Savhanna Sliva, PA-C 05/28/23  ADDENDUM: Hematology/Oncology Attending: I had a face-to-face encounter with the patient today.  I reviewed her record, lab, scan and recommended her care plan.  This is a very pleasant 82 years old white female with history of stage  Ia non-small cell lung cancer involving the right lower lobe diagnosed in August 2017 status post SBRT under the care of Dr. Kathrynn Running.  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 She is here today for evaluation with repeat CBC, iron study and ferritin as well as CT scan of the chest. I personally and independently reviewed the scan images with the patient and her family member and there is no clear evidence for disease progression. For the anemia, I will arrange for the patient to receive iron infusion with Venofer at the [facility]. The patient will come back for follow-up visit in 6 months with repeat blood work for evaluation of her anemia and we will repeat her CT scan of the chest in 1 year. She was advised to call immediately if she has any other concerning symptoms in the interval. The total time spent in the appointment was 30 minutes. Disclaimer: This note was dictated with voice recognition software. Similar sounding words can inadvertently be transcribed and may be missed upon review. Lajuana Matte, MD

## 2023-05-24 ENCOUNTER — Observation Stay (HOSPITAL_COMMUNITY)
Admission: EM | Admit: 2023-05-24 | Discharge: 2023-05-27 | Disposition: A | Discharge status: Home / Self Care | Payer: 59 | Attending: Internal Medicine | Admitting: Internal Medicine

## 2023-05-24 ENCOUNTER — Other Ambulatory Visit: Payer: Self-pay

## 2023-05-24 ENCOUNTER — Encounter (HOSPITAL_COMMUNITY): Payer: Self-pay | Admitting: Internal Medicine

## 2023-05-24 DIAGNOSIS — Z951 Presence of aortocoronary bypass graft: Secondary | ICD-10-CM | POA: Insufficient documentation

## 2023-05-24 DIAGNOSIS — Z7902 Long term (current) use of antithrombotics/antiplatelets: Secondary | ICD-10-CM | POA: Diagnosis not present

## 2023-05-24 DIAGNOSIS — Z85828 Personal history of other malignant neoplasm of skin: Secondary | ICD-10-CM | POA: Diagnosis not present

## 2023-05-24 DIAGNOSIS — K921 Melena: Principal | ICD-10-CM | POA: Diagnosis present

## 2023-05-24 DIAGNOSIS — I2510 Atherosclerotic heart disease of native coronary artery without angina pectoris: Secondary | ICD-10-CM | POA: Diagnosis present

## 2023-05-24 DIAGNOSIS — Z85118 Personal history of other malignant neoplasm of bronchus and lung: Secondary | ICD-10-CM | POA: Diagnosis not present

## 2023-05-24 DIAGNOSIS — J449 Chronic obstructive pulmonary disease, unspecified: Secondary | ICD-10-CM | POA: Diagnosis not present

## 2023-05-24 DIAGNOSIS — I130 Hypertensive heart and chronic kidney disease with heart failure and stage 1 through stage 4 chronic kidney disease, or unspecified chronic kidney disease: Secondary | ICD-10-CM | POA: Diagnosis not present

## 2023-05-24 DIAGNOSIS — I5032 Chronic diastolic (congestive) heart failure: Secondary | ICD-10-CM | POA: Diagnosis not present

## 2023-05-24 DIAGNOSIS — Z8673 Personal history of transient ischemic attack (TIA), and cerebral infarction without residual deficits: Secondary | ICD-10-CM

## 2023-05-24 DIAGNOSIS — D62 Acute posthemorrhagic anemia: Secondary | ICD-10-CM | POA: Diagnosis not present

## 2023-05-24 DIAGNOSIS — D649 Anemia, unspecified: Secondary | ICD-10-CM

## 2023-05-24 DIAGNOSIS — N184 Chronic kidney disease, stage 4 (severe): Secondary | ICD-10-CM | POA: Diagnosis not present

## 2023-05-24 DIAGNOSIS — K922 Gastrointestinal hemorrhage, unspecified: Principal | ICD-10-CM

## 2023-05-24 DIAGNOSIS — K219 Gastro-esophageal reflux disease without esophagitis: Secondary | ICD-10-CM | POA: Diagnosis present

## 2023-05-24 DIAGNOSIS — I10 Essential (primary) hypertension: Secondary | ICD-10-CM | POA: Diagnosis present

## 2023-05-24 DIAGNOSIS — Z79899 Other long term (current) drug therapy: Secondary | ICD-10-CM | POA: Diagnosis not present

## 2023-05-24 DIAGNOSIS — Z87891 Personal history of nicotine dependence: Secondary | ICD-10-CM | POA: Insufficient documentation

## 2023-05-24 DIAGNOSIS — I739 Peripheral vascular disease, unspecified: Secondary | ICD-10-CM | POA: Diagnosis present

## 2023-05-24 DIAGNOSIS — Z8719 Personal history of other diseases of the digestive system: Secondary | ICD-10-CM

## 2023-05-24 DIAGNOSIS — E785 Hyperlipidemia, unspecified: Secondary | ICD-10-CM | POA: Diagnosis present

## 2023-05-24 LAB — CBC
HCT: 25.9 % — ABNORMAL LOW (ref 36.0–46.0)
Hemoglobin: 8 g/dL — ABNORMAL LOW (ref 12.0–15.0)
MCH: 28.7 pg (ref 26.0–34.0)
MCHC: 30.9 g/dL (ref 30.0–36.0)
MCV: 92.8 fL (ref 80.0–100.0)
Platelets: 292 10*3/uL (ref 150–400)
RBC: 2.79 MIL/uL — ABNORMAL LOW (ref 3.87–5.11)
RDW: 13.2 % (ref 11.5–15.5)
WBC: 6.4 10*3/uL (ref 4.0–10.5)
nRBC: 0 % (ref 0.0–0.2)

## 2023-05-24 LAB — COMPREHENSIVE METABOLIC PANEL
ALT: 11 U/L (ref 0–44)
AST: 17 U/L (ref 15–41)
Albumin: 3.7 g/dL (ref 3.5–5.0)
Alkaline Phosphatase: 62 U/L (ref 38–126)
Anion gap: 8 (ref 5–15)
BUN: 29 mg/dL — ABNORMAL HIGH (ref 8–23)
CO2: 24 mmol/L (ref 22–32)
Calcium: 8.8 mg/dL — ABNORMAL LOW (ref 8.9–10.3)
Chloride: 105 mmol/L (ref 98–111)
Creatinine, Ser: 1.78 mg/dL — ABNORMAL HIGH (ref 0.44–1.00)
GFR, Estimated: 29 mL/min — ABNORMAL LOW (ref >60)
Glucose, Bld: 88 mg/dL (ref 70–99)
Potassium: 4.2 mmol/L (ref 3.5–5.1)
Sodium: 137 mmol/L (ref 135–145)
Total Bilirubin: 0.5 mg/dL (ref 0.3–1.2)
Total Protein: 7.2 g/dL (ref 6.5–8.1)

## 2023-05-24 LAB — PROTIME-INR
INR: 1.1 (ref 0.8–1.2)
Prothrombin Time: 14.6 s (ref 11.4–15.2)

## 2023-05-24 LAB — POC OCCULT BLOOD, ED: Fecal Occult Bld: POSITIVE — AB

## 2023-05-24 LAB — FERRITIN: Ferritin: 6 ng/mL — ABNORMAL LOW (ref 11–307)

## 2023-05-24 LAB — PREPARE RBC (CROSSMATCH)

## 2023-05-24 MED ORDER — AMIODARONE HCL 200 MG PO TABS
100.0000 mg | ORAL_TABLET | Freq: Every day | ORAL | Status: DC
  Administered 2023-05-25 – 2023-05-26 (×2): 100 mg via ORAL
  Filled 2023-05-24 (×2): qty 1

## 2023-05-24 MED ORDER — UMECLIDINIUM BROMIDE 62.5 MCG/ACT IN AEPB
1.0000 | INHALATION_SPRAY | Freq: Every day | RESPIRATORY_TRACT | Status: DC
  Administered 2023-05-25 – 2023-05-27 (×3): 1 via RESPIRATORY_TRACT
  Filled 2023-05-24: qty 7

## 2023-05-24 MED ORDER — ONDANSETRON HCL 4 MG PO TABS: 4.0000 mg | ORAL_TABLET | Freq: Four times a day (QID) | ORAL | Status: DC | PRN

## 2023-05-24 MED ORDER — LORATADINE 10 MG PO TABS: 10.0000 mg | ORAL_TABLET | Freq: Every day | ORAL | Status: DC | PRN

## 2023-05-24 MED ORDER — PANTOPRAZOLE SODIUM 40 MG IV SOLR
40.0000 mg | Freq: Two times a day (BID) | INTRAVENOUS | Status: DC
  Administered 2023-05-24 – 2023-05-26 (×5): 40 mg via INTRAVENOUS
  Filled 2023-05-24 (×5): qty 10

## 2023-05-24 MED ORDER — ALBUTEROL SULFATE (2.5 MG/3ML) 0.083% IN NEBU: 2.5000 mg | INHALATION_SOLUTION | RESPIRATORY_TRACT | Status: DC | PRN

## 2023-05-24 MED ORDER — PANTOPRAZOLE 80MG IVPB - SIMPLE MED
80.0000 mg | Freq: Once | INTRAVENOUS | Status: AC
  Administered 2023-05-24: 80 mg via INTRAVENOUS
  Filled 2023-05-24: qty 80

## 2023-05-24 MED ORDER — FUROSEMIDE 40 MG PO TABS
40.0000 mg | ORAL_TABLET | Freq: Every day | ORAL | Status: DC
  Administered 2023-05-25: 40 mg via ORAL
  Filled 2023-05-24: qty 1

## 2023-05-24 MED ORDER — ATORVASTATIN CALCIUM 40 MG PO TABS
40.0000 mg | ORAL_TABLET | Freq: Every day | ORAL | Status: DC
  Administered 2023-05-24 – 2023-05-26 (×3): 40 mg via ORAL
  Filled 2023-05-24 (×2): qty 1

## 2023-05-24 MED ORDER — IRBESARTAN 75 MG PO TABS
75.0000 mg | ORAL_TABLET | Freq: Every day | ORAL | Status: DC
  Administered 2023-05-25 – 2023-05-26 (×2): 75 mg via ORAL
  Filled 2023-05-24 (×2): qty 1

## 2023-05-24 MED ORDER — TRAZODONE HCL 50 MG PO TABS: 25.0000 mg | ORAL_TABLET | Freq: Every evening | ORAL | Status: DC | PRN

## 2023-05-24 MED ORDER — FLUTICASONE FUROATE-VILANTEROL 200-25 MCG/ACT IN AEPB
1.0000 | INHALATION_SPRAY | Freq: Every day | RESPIRATORY_TRACT | Status: DC
  Administered 2023-05-25 – 2023-05-27 (×3): 1 via RESPIRATORY_TRACT
  Filled 2023-05-24: qty 28

## 2023-05-24 MED ORDER — ONDANSETRON HCL 4 MG/2ML IJ SOLN: 4.0000 mg | Freq: Four times a day (QID) | INTRAMUSCULAR | Status: DC | PRN

## 2023-05-24 MED ORDER — ACETAMINOPHEN 325 MG PO TABS
650.0000 mg | ORAL_TABLET | Freq: Once | ORAL | Status: AC
  Administered 2023-05-24: 650 mg via ORAL
  Filled 2023-05-24: qty 2

## 2023-05-24 MED ORDER — SODIUM CHLORIDE 0.9% IV SOLUTION: Freq: Once | INTRAVENOUS | Status: DC

## 2023-05-24 NOTE — ED Provider Notes (Signed)
Emergency Department Provider Note   I have reviewed the triage vital signs and the nursing notes.   HISTORY  Chief Complaint Melena   HPI [name] is a 82 y.o. female past history reviewed below including multiple AVMs presents emergency department for evaluation of dark, melena like stool at home with increasing fatigue.  Patient states that she has had intermittent GI bleeding and is followed by Dr. Elnoria Howard as an outpatient.  She went to her oncology appointment yesterday and had blood work at that time showing downtrending hemoglobin.  She has required lower endoscopy and blood transfusion in the past.  She had worsening black, bowel movements this morning and so presented for evaluation.   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   Diverticulitis    Dyspnea    with exertion   Gastroesophageal reflux disease    occ   Glaucoma    right eye   Headache syndrome 11/27/2018   Hiatal hernia    History of shingles 06/23/2018   Hyperlipidemia    Hypertension    Lung cancer (HCC) dx 2018   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 Positive weakness and fatigue.  Cardiovascular: Denies chest pain. Respiratory: Denies shortness of breath. Gastrointestinal: Occasional right abdominal pain, none currently.  No nausea, no vomiting. Positive black stool per rectum.  Musculoskeletal: Negative for back pain. Skin: Negative for rash. Neurological: Negative for headaches.   ____________________________________________   PHYSICAL EXAM:  VITAL SIGNS: ED Triage Vitals [05/24/23 1059]  Encounter Vitals Group     BP (!) 169/59     Pulse Rate 61     Resp 18     Temp 98 F (36.7 C)     Temp Source Oral     SpO2 100 %     Weight 145 lb (65.8 kg)     Height 5\' 2"  (1.575 m)   Constitutional: Alert and oriented. Well appearing and in no acute distress. Eyes: Conjunctivae are normal.  Head: Atraumatic. Nose: No congestion/rhinnorhea. Mouth/Throat: Mucous membranes are moist.   Neck: No stridor.   Cardiovascular: Normal rate, regular rhythm. Good peripheral circulation. Grossly normal heart sounds.   Respiratory: Normal respiratory effort.  No retractions. Lungs CTAB. Gastrointestinal: Soft and nontender. No distention.  Rectal exam performed with patient's verbal consent and nurse tech chaperone present.  Dark brown stool on exam without obvious hemorrhoid or bright red blood.  Hemoccult positive. Musculoskeletal: No gross deformities of extremities. Neurologic:  Normal speech and language.  Skin:  Skin is warm, dry and intact. No rash noted. ____________________________________________   LABS (all labs ordered are listed, but only abnormal results are displayed)  Labs Reviewed  CBC - Abnormal; Notable for the following components:      Result Value   RBC 2.79 (*)    Hemoglobin 8.0 (*)    HCT 25.9 (*)    All other components within normal limits  POC OCCULT BLOOD, ED - Abnormal; Notable for the following  components:   Fecal Occult Bld POSITIVE (*)    All other components within normal limits  COMPREHENSIVE METABOLIC PANEL  PROTIME-INR  TYPE AND SCREEN   ____________________________________________   PROCEDURES  Procedure(s) performed:   Procedures  CRITICAL CARE Performed by: Maia Plan Total critical care time: 35 minutes Critical care time was exclusive of separately billable procedures and treating other patients. Critical care was necessary to treat or prevent imminent or life-threatening deterioration. Critical care was time spent personally by me on the following activities: development of treatment plan with patient and/or surrogate as well as nursing, discussions with consultants, evaluation of patient's response to treatment, examination of patient, obtaining history from patient or surrogate, ordering and performing treatments and interventions, ordering and review of laboratory studies, ordering and review of radiographic studies, pulse oximetry and re-evaluation of patient's condition.  Alona Bene, MD Emergency Medicine  ____________________________________________   INITIAL IMPRESSION / ASSESSMENT AND PLAN / ED COURSE  Pertinent labs & imaging results that were available during my care of the patient were reviewed by me and considered in my medical decision making (see chart for details).   This patient is Presenting for Evaluation of weakness, which does require a range of treatment options, and is a complaint that involves a high risk of morbidity and mortality.  The Differential Diagnoses include symptomatic anemia, acute GI bleeding, AKI, ACS, etc.  Critical Interventions-    Medications  pantoprazole (PROTONIX) 80 mg /NS 100 mL IVPB (0 mg Intravenous Stopped 05/24/23 1355)    Reassessment after intervention: no hemodynamic instability.   I did obtain Additional Historical Information from daughter at bedside.  I decided to review pertinent  External Data, and in summary hemoglobin of 9.3 on 9/25 downtrending now to 8.5 on 10/10.    Clinical Laboratory Tests Ordered, included CBC with Hb of 8.0. FOBT positive.   Cardiac Monitor Tracing which shows NSR.    Social Determinants of Health Risk patient is a non-smoker.   Consult complete with Dr. Elnoria Howard with GI. Plan for overnight observation with TRH and Hb treading. PRBC transfusion. No immediate plan for lower endoscopy.   Medical Decision Making: Summary:  Presents emergency department with concern for GI bleeding and worsening generalized weakness.  She is Hemoccult positive but no obvious melena.  Plan for Protonix and will obtain a repeat CBC.  She is followed by Dr. Elnoria Howard.   TRH. Plan for admit.   Reevaluation with update and discussion with patient. Plan for PRBC transfusion and overnight monitoring. She has symptomatic anemia with multiple medical co-morbidities and significant symptoms. She is in agreement.   Patient's presentation is most consistent with acute presentation with potential threat to life or bodily function.   Disposition: admit  ____________________________________________  FINAL CLINICAL IMPRESSION(S) / ED DIAGNOSES  Final diagnoses:  Gastrointestinal hemorrhage, unspecified gastrointestinal hemorrhage type  Symptomatic anemia    Note:  This document was prepared using Dragon  voice recognition software and may include unintentional dictation errors.  Alona Bene, MD, [facility] Emergency Medicine    Raja Liska, Arlyss Repress, MD 05/24/23 1538

## 2023-05-24 NOTE — ED Triage Notes (Signed)
Pt arrives POV for GI bleed. Hgb yesterday was 8.5, and is passing black stools. Pt also c/o nausea.

## 2023-05-24 NOTE — ED Notes (Signed)
ED TO INPATIENT HANDOFF REPORT  ED Nurse Name and Phone #:  Mellody Dance  -  [phone]  S Name/Age/Gender [name] 82 y.o. female Room/Bed: WA21/WA21  Code Status   Code Status: Limited: Do not attempt resuscitation (DNR) -DNR-LIMITED -Do Not Intubate/DNI   Home/SNF/Other Home Patient oriented to: self, place, time, and situation Is this baseline? Yes   Triage Complete: Triage complete  Chief Complaint ABLA (acute blood loss anemia) [D62]  Triage Note Pt arrives POV for GI bleed. Hgb yesterday was 8.5, and is passing black stools. Pt also c/o nausea.   Allergies Allergies  Allergen Reactions   Avelox [Moxifloxacin Hcl In Nacl] Palpitations and Other (See Comments)    Caused Heart Attack    Azithromycin Swelling and Other (See Comments)    Patient reported past history of lip swelling   Codeine Other (See Comments)    Dr. Sharyn Lull advised patient not to take this medication   Doxycycline Swelling and Other (See Comments)    Mouth, lips, feet swelling   Hydromorphone Palpitations and Other (See Comments)    DILAUDID  -  Pt had a Heart Attack after taking Dilaudid.   Levaquin [Levofloxacin] Shortness Of Breath and Other (See Comments)    Chest pressure, SOB, "pain in between shoulder blades", sweaty -as reported by patient per experience in ED this afternoon   Vitamin D Analogs Swelling and Other (See Comments)    Face and lips swell, but no breathing issues   Nifedipine Er Other (See Comments)    Dropped the heart rate too low   Octreotide     Chest pain, N/V   Zetia [Ezetimibe] Other (See Comments)    "Made me feel like my face was going to sleep/numb"   Oxycodone-Acetaminophen Other (See Comments)    Says it makes her "feel weird"   Risedronate Other (See Comments)    Chest pain    Level of Care/Admitting Diagnosis ED Disposition     ED Disposition  Admit   Condition  --   Comment  Hospital Area: [facility] [[id number]]  Level of Care:  Med-Surg [16]  May place patient in observation at [facility] or [facility] if equivalent level of care is available:: Yes  Covid Evaluation: Asymptomatic - no recent exposure (last 10 days) testing not required  Diagnosis: ABLA (acute blood loss anemia) [[id number]]  Admitting Physician: Maryln Gottron [[id number]]  Attending Physician: Kirby Crigler, MIR [email or web address] [[id number]]          B Medical/Surgery History 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   Diverticulitis    Dyspnea    with exertion   Gastroesophageal reflux disease    occ   Glaucoma    right eye   Headache syndrome 11/27/2018   Hiatal hernia    History of shingles 06/23/2018   Hyperlipidemia    Hypertension    Lung cancer (HCC) dx 2018   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   Myocardial infarction (HCC) 01/01/2000   Cardiac catheterization  Peripheral vascular disease (HCC)    stents in legs x 2 or 3   Pneumonia    last time winter 2017 -2018   PONV (postoperative nausea and vomiting)    occassionally, last colonscopy did ok with anesthesia   Primary cancer of right lower lobe of lung (HCC) 04/25/2016   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   TIA (transient ischemic attack) 02/11/2019   Wears dentures    Full set   Wears glasses    Past Surgical History:  Procedure Laterality Date   ABDOMINAL HYSTERECTOMY     APPENDECTOMY     Arnold-chiari malformation repair  1998   Suboccipital craniectomy   CAROTID ENDARTERECTOMY  03/29/2010   Left  CEA   CHOLECYSTECTOMY     Gall Bladder    COLONOSCOPY WITH PROPOFOL N/A 04/22/2015   Procedure: COLONOSCOPY WITH PROPOFOL;  Surgeon: Jeani Hawking, MD;  Location: [facility]COPY;  Service: Endoscopy;  Laterality: N/A;   COLONOSCOPY WITH PROPOFOL N/A 05/25/2016   Procedure: COLONOSCOPY WITH PROPOFOL;  Surgeon: Jeani Hawking, MD;  Location: [facility];  Service: Endoscopy;  Laterality: N/A;   COLONOSCOPY WITH PROPOFOL N/A 05/03/2017   Procedure: COLONOSCOPY WITH PROPOFOL;  Surgeon: Jeani Hawking, MD;  Location: [facility];  Service: Endoscopy;  Laterality: N/A;   COLONOSCOPY WITH PROPOFOL N/A 06/29/2020   Procedure: COLONOSCOPY WITH PROPOFOL;  Surgeon: Jeani Hawking, MD;  Location: [facility];  Service: Endoscopy;  Laterality: N/A;   COLONOSCOPY WITH PROPOFOL N/A 01/05/2021   Procedure: COLONOSCOPY WITH PROPOFOL;  Surgeon: Jeani Hawking, MD;  Location: [facility];  Service: Endoscopy;  Laterality: N/A;   COLONOSCOPY WITH PROPOFOL N/A 09/28/2021   Procedure: COLONOSCOPY WITH PROPOFOL;  Surgeon: Jeani Hawking, MD;  Location: [facility] ENDOSCOPY;  Service: Endoscopy;  Laterality: N/A;   CORNEAL TRANSPLANT     Right   CORONARY ARTERY BYPASS GRAFT  01/01/2000   x 3   ENDARTERECTOMY Right 09/26/2021   Procedure: RIGHT CAROTID ENDARTERECTOMY;  Surgeon: Chuck Hint, MD;  Location: [facility] OR;  Service: Vascular;  Laterality: Right;   ENTEROSCOPY N/A 02/27/2018   Procedure: ENTEROSCOPY;  Surgeon: Jeani Hawking, MD;  Location: [facility];  Service: Endoscopy;  Laterality: N/A;   ENTEROSCOPY N/A 07/18/2018   Procedure: ENTEROSCOPY;  Surgeon: Jeani Hawking, MD;  Location: [facility];  Service: Endoscopy;  Laterality: N/A;   ENTEROSCOPY N/A 06/29/2020   Procedure: ENTEROSCOPY;  Surgeon: Jeani Hawking, MD;  Location: [facility];  Service: Endoscopy;  Laterality: N/A;   ENTEROSCOPY N/A 12/05/2021   Procedure: ENTEROSCOPY;  Surgeon: Jeani Hawking, MD;  Location: [facility];  Service: Gastroenterology;  Laterality: N/A;   ENTEROSCOPY N/A 12/25/2021    Procedure: ENTEROSCOPY;  Surgeon: Jeani Hawking, MD;  Location: [facility];  Service: Gastroenterology;  Laterality: N/A;  with endoscopic placement of video capsule   ESOPHAGOGASTRODUODENOSCOPY N/A 05/25/2016   Procedure: ESOPHAGOGASTRODUODENOSCOPY (EGD);  Surgeon: Jeani Hawking, MD;  Location: [facility] ENDOSCOPY;  Service: Endoscopy;  Laterality: N/A;   ESOPHAGOGASTRODUODENOSCOPY N/A 08/01/2018   Procedure: ESOPHAGOGASTRODUODENOSCOPY (EGD);  Surgeon: Jeani Hawking, MD;  Location: [facility];  Service: Endoscopy;  Laterality: N/A;   ESOPHAGOGASTRODUODENOSCOPY (EGD) WITH PROPOFOL N/A 09/28/2021   Procedure: ESOPHAGOGASTRODUODENOSCOPY (EGD) WITH PROPOFOL;  Surgeon: Jeani Hawking, MD;  Location: [facility] ENDOSCOPY;  Service: Endoscopy;  Laterality: N/A;   EYE SURGERY Right 1995 or 1996   Laser surgery for retinal hemorrhage   GIVENS CAPSULE STUDY N/A 07/16/2018   Procedure: GIVENS CAPSULE STUDY;  Surgeon: Jeani Hawking, MD;  Location: [facility];  Service: Endoscopy;  Laterality: N/A;   GIVENS CAPSULE STUDY N/A 12/14/2021   Procedure: GIVENS CAPSULE STUDY;  Surgeon: Jeani Hawking, MD;  Location: [facility] ENDOSCOPY;  Service: Gastroenterology;  Laterality: N/A;   GIVENS CAPSULE STUDY N/A 12/25/2021   Procedure: GIVENS CAPSULE STUDY;  Surgeon: Jeani Hawking, MD;  Location: [facility];  Service: Gastroenterology;  Laterality: N/A;   HEMOSTASIS CLIP PLACEMENT  06/29/2020   Procedure: HEMOSTASIS CLIP PLACEMENT;  Surgeon: Jeani Hawking, MD;  Location: [facility];  Service: Endoscopy;;   HEMOSTASIS CLIP PLACEMENT  01/05/2021   Procedure: HEMOSTASIS CLIP PLACEMENT;  Surgeon: Jeani Hawking, MD;  Location: [facility];  Service: Endoscopy;;   HOT HEMOSTASIS N/A 02/27/2018   Procedure: HOT HEMOSTASIS (ARGON PLASMA COAGULATION/BICAP);  Surgeon: Jeani Hawking, MD;  Location: [facility];  Service: Endoscopy;  Laterality: N/A;   HOT HEMOSTASIS N/A 08/01/2018   Procedure: HOT HEMOSTASIS (ARGON PLASMA COAGULATION/BICAP);   Surgeon: Jeani Hawking, MD;  Location: [facility];  Service: Endoscopy;  Laterality: N/A;   HOT HEMOSTASIS N/A 06/29/2020   Procedure: HOT HEMOSTASIS (ARGON PLASMA COAGULATION/BICAP);  Surgeon: Jeani Hawking, MD;  Location: [facility] END[facility]COPY;  Service: Endoscopy;  Laterality: N/A;   HOT HEMOSTASIS N/A 01/05/2021   Procedure: HOT HEMOSTASIS (ARGON PLASMA COAGULATION/BICAP);  Surgeon: Jeani Hawking, MD;  Location: [facility];  Service: Endoscopy;  Laterality: N/A;   HOT HEMOSTASIS N/A 12/05/2021   Procedure: HOT HEMOSTASIS (ARGON PLASMA COAGULATION/BICAP);  Surgeon: Jeani Hawking, MD;  Location: [facility] ENDOSCOPY;  Service: Gastroenterology;  Laterality: N/A;   IR RADIOLOGIST EVAL & MGMT  12/14/2016   IR RADIOLOGIST EVAL & MGMT  07/26/2021   LEFT HEART CATH AND CORS/GRAFTS ANGIOGRAPHY N/A 01/09/2018   Procedure: LEFT HEART CATH AND CORS/GRAFTS ANGIOGRAPHY;  Surgeon: Rinaldo Cloud, MD;  Location: [facility] INVASIVE CV LAB;  Service: Cardiovascular;  Laterality: N/A;   LEFT HEART CATHETERIZATION WITH CORONARY ANGIOGRAM N/A 08/03/2014   Procedure: LEFT HEART CATHETERIZATION WITH CORONARY ANGIOGRAM;  Surgeon: Ricki Rodriguez, MD;  Location: [facility] CATH LAB;  Service: Cardiovascular;  Laterality: N/A;   PATCH ANGIOPLASTY Right 09/26/2021   Procedure: PATCH ANGIOPLASTY RIGHT CAROTID;  Surgeon: Chuck Hint, MD;  Location: [facility] OR;  Service: Vascular;  Laterality: Right;   Post Coronary Artery  BPG  01/05/2000   Right jugular sheath removed   PR VEIN BYPASS GRAFT,AORTO-FEM-POP     ROTATOR CUFF REPAIR     Right     A IV Location/Drains/Wounds Patient Lines/Drains/Airways Status     Active Line/Drains/Airways     Name Placement date Placement time Site Days   Peripheral IV 05/24/23 20 G Right Antecubital 05/24/23  1323  Antecubital  less than 1            Intake/Output Last 24 hours  Intake/Output Summary (Last 24 hours) at 05/24/2023 1532 Last data filed at 05/24/2023 1355 Gross per 24 hour  Intake  100.5 ml  Output --  Net 100.5 ml    Labs/Imaging Results for orders placed or performed during the hospital encounter of 05/24/23 (from the past 48 hour(s))  Type and screen [facility]     Status: None   Collection Time: 05/24/23 11:27 AM  Result Value Ref Range   ABO/RH(D) A POS    Antibody Screen NEG    Sample Expiration      05/27/2023,2359 Performed at [facility], [address], [place], [place] [address]   POC occult blood, ED     Status: Abnormal   Collection Time: 05/24/23 11:54 AM  Result  Value Ref Range   Fecal Occult Bld POSITIVE (A) NEGATIVE  Comprehensive metabolic panel     Status: Abnormal   Collection Time: 05/24/23  1:25 PM  Result Value Ref Range   Sodium 137 135 - 145 mmol/L   Potassium 4.2 3.5 - 5.1 mmol/L   Chloride 105 98 - 111 mmol/L   CO2 24 22 - 32 mmol/L   Glucose, Bld 88 70 - 99 mg/dL    Comment: Glucose reference range applies only to samples taken after fasting for at least 8 hours.   BUN 29 (H) 8 - 23 mg/dL   Creatinine, Ser 4.09 (H) 0.44 - 1.00 mg/dL   Calcium 8.8 (L) 8.9 - 10.3 mg/dL   Total Protein 7.2 6.5 - 8.1 g/dL   Albumin 3.7 3.5 - 5.0 g/dL   AST 17 15 - 41 U/L   ALT 11 0 - 44 U/L   Alkaline Phosphatase 62 38 - 126 U/L   Total Bilirubin 0.5 0.3 - 1.2 mg/dL   GFR, Estimated 29 (L) >60 mL/min    Comment: (NOTE) Calculated using the CKD-EPI Creatinine Equation (2021)    Anion gap 8 5 - 15    Comment: Performed at [facility], [address] [place], [place] [address]  CBC     Status: Abnormal   Collection Time: 05/24/23  1:25 PM  Result Value Ref Range   WBC 6.4 4.0 - 10.5 K/uL   RBC 2.79 (L) 3.87 - 5.11 MIL/uL   Hemoglobin 8.0 (L) 12.0 - 15.0 g/dL   HCT 47.8 (L) 29.5 - 62.1 %   MCV 92.8 80.0 - 100.0 fL   MCH 28.7 26.0 - 34.0 pg   MCHC 30.9 30.0 - 36.0 g/dL   RDW 30.8 65.7 - 84.6 %   Platelets 292 150 - 400 K/uL   nRBC 0.0 0.0 - 0.2 %    Comment: Performed at  [facility], [address], [place], [place] [address]  Protime-INR - (order if Patient is taking Coumadin / Warfarin)     Status: None   Collection Time: 05/24/23  1:25 PM  Result Value Ref Range   Prothrombin Time 14.6 11.4 - 15.2 seconds   INR 1.1 0.8 - 1.2    Comment: (NOTE) INR goal varies based on device and disease states. Performed at [facility], [address], [place], [place] [address]    No results found.  Pending Labs Unresulted Labs (From admission, onward)     Start     Ordered   05/25/23 0500  Basic metabolic panel  Tomorrow morning,   R        05/24/23 1515   05/25/23 0500  CBC  Tomorrow morning,   R        05/24/23 1515   05/24/23 1446  Prepare RBC (crossmatch)  (Blood Administration Adult)  Once,   R       Question Answer Comment  # of Units 1 unit   Transfusion Indications Hemoglobin 8 gm/dL or less and orthopedic or cardiac surgery or pre-existing cardiac condition   Number of Units to Keep Ahead NO units ahead   If emergent release call blood bank Not emergent release      05/24/23 1446            Vitals/Pain Today's Vitals   05/24/23 1059 05/24/23 1416  BP: (!) 169/59 (!) 149/48  Pulse: 61 69  Resp: 18 17  Temp: 98 F (36.7  C) 98 F (36.7 C)  TempSrc: Oral Oral  SpO2: 100% 100%  Weight: 65.8 kg   Height: 5\' 2"  (1.575 m)     Isolation Precautions No active isolations  Medications Medications  0.9 %  sodium chloride infusion (Manually program via Guardrails IV Fluids) (has no administration in time range)  pantoprazole (PROTONIX) injection 40 mg (has no administration in time range)  amiodarone (PACERONE) tablet 100 mg (has no administration in time range)  atorvastatin (LIPITOR) tablet 40 mg (has no administration in time range)  furosemide (LASIX) tablet 40 mg (has no administration in time range)  irbesartan (AVAPRO) tablet 75 mg (has no administration in time range)  fluticasone  furoate-vilanterol (BREO ELLIPTA) 200-25 MCG/ACT 1 puff (1 puff Inhalation Not Given 05/24/23 1530)    And  umeclidinium bromide (INCRUSE ELLIPTA) 62.5 MCG/ACT 1 puff (1 puff Inhalation Not Given 05/24/23 1530)  loratadine (CLARITIN) tablet 10 mg (has no administration in time range)  traZODone (DESYREL) tablet 25 mg (has no administration in time range)  ondansetron (ZOFRAN) tablet 4 mg (has no administration in time range)    Or  ondansetron (ZOFRAN) injection 4 mg (has no administration in time range)  albuterol (PROVENTIL) (2.5 MG/3ML) 0.083% nebulizer solution 2.5 mg (has no administration in time range)  pantoprazole (PROTONIX) 80 mg /NS 100 mL IVPB (0 mg Intravenous Stopped 05/24/23 1355)    Mobility walks     Focused Assessments    R Recommendations: See Admitting Provider Note  Report given to:   Additional Notes:

## 2023-05-24 NOTE — H&P (Signed)
History and Physical  [name]:[id number] DOB: 1941/06/29 DOA: 05/24/2023  PCP: Tracey Harries, MD   Chief Complaint: Weakness  HPI: [name] is a 82 y.o. 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  Patient states she has been in her usual state of health, after her last hospital stay in May her Pletal was discontinued and she has continued to have dose Plavix daily.  She last took dose of 37.5 mg Plavix this morning.  States that for the last couple of weeks, she has noticed very dark stools, she has had some mild abdominal discomfort and vague nausea, which she states is actually chronic for her and unchanged.  She went to see her PCP couple weeks ago hemoglobin was 9.3, she had a follow-up with her oncologist Dr. Arbutus Ped yesterday and hemoglobin was 8.5.  Today she continued to feel weak and came to the ER for evaluation.  Renal function is stable, labs are significant for hemoglobin 8.0.  Vital signs are unremarkable.  ER provider discussed with Dr. Elnoria Howard of gastroenterology, who recommended observation admission and transfusion of 1 unit of blood.  Review of Systems: Please see HPI for pertinent positives and negatives. A complete 10 system review of systems are otherwise negative.  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   Blood in stool    last week of aug 2018   CAP (community acquired pneumonia) 10/22/2015   Chronic kidney disease    COPD (chronic obstructive pulmonary disease) (HCC)    Coronary artery  disease    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   Diverticulitis    Dyspnea    with exertion   Gastroesophageal reflux disease    occ   Glaucoma    right eye   Headache syndrome 11/27/2018   Hiatal hernia    History of shingles 06/23/2018   Hyperlipidemia    Hypertension    Lung cancer (HCC) dx 2018   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   Myocardial infarction (HCC) 01/01/2000   Cardiac catheterization   Peripheral vascular disease (HCC)    stents in legs x 2 or 3   Pneumonia    last time winter 2017 -2018   PONV (postoperative nausea and vomiting)    occassionally, last colonscopy did ok with anesthesia   Primary cancer of right lower lobe of lung (HCC) 04/25/2016   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   TIA (transient ischemic attack) 02/11/2019   Wears dentures    Full set   Wears glasses    Past Surgical History:  Procedure Laterality Date   ABDOMINAL HYSTERECTOMY     APPENDECTOMY     Arnold-chiari malformation repair  1998   Suboccipital craniectomy   CAROTID ENDARTERECTOMY  03/29/2010   Left  CEA   CHOLECYSTECTOMY     Gall Bladder   COLONOSCOPY WITH PROPOFOL N/A 04/22/2015   Procedure: COLONOSCOPY WITH PROPOFOL;  Surgeon: Jeani Hawking, MD;  Location: [facility] END[facility]COPY;  Service: Endoscopy;  Laterality: N/A;   COLONOSCOPY WITH PROPOFOL N/A 05/25/2016   Procedure: COLONOSCOPY  WITH PROPOFOL;  Surgeon: Jeani Hawking, MD;  Location: [facility];  Service: Endoscopy;  Laterality: N/A;   COLONOSCOPY WITH PROPOFOL N/A 05/03/2017   Procedure: COLONOSCOPY WITH PROPOFOL;  Surgeon: Jeani Hawking, MD;  Location: [facility];  Service: Endoscopy;  Laterality: N/A;   COLONOSCOPY WITH PROPOFOL N/A 06/29/2020   Procedure: COLONOSCOPY WITH PROPOFOL;  Surgeon: Jeani Hawking, MD;  Location: [facility];  Service: Endoscopy;  Laterality: N/A;   COLONOSCOPY WITH PROPOFOL N/A 01/05/2021   Procedure: COLONOSCOPY WITH PROPOFOL;  Surgeon: Jeani Hawking, MD;  Location: [facility];  Service: Endoscopy;  Laterality: N/A;   COLONOSCOPY WITH PROPOFOL N/A 09/28/2021   Procedure: COLONOSCOPY WITH PROPOFOL;  Surgeon: Jeani Hawking, MD;  Location: [facility] ENDOSCOPY;  Service: Endoscopy;  Laterality: N/A;   CORNEAL TRANSPLANT     Right   CORONARY ARTERY BYPASS GRAFT  01/01/2000   x 3   ENDARTERECTOMY Right 09/26/2021   Procedure: RIGHT CAROTID ENDARTERECTOMY;  Surgeon: Chuck Hint, MD;  Location: [facility] OR;  Service: Vascular;  Laterality: Right;   ENTEROSCOPY N/A 02/27/2018   Procedure: ENTEROSCOPY;  Surgeon: Jeani Hawking, MD;  Location: [facility];  Service: Endoscopy;  Laterality: N/A;   ENTEROSCOPY N/A 07/18/2018   Procedure: ENTEROSCOPY;  Surgeon: Jeani Hawking, MD;  Location: [facility];  Service: Endoscopy;  Laterality: N/A;   ENTEROSCOPY N/A 06/29/2020   Procedure: ENTEROSCOPY;  Surgeon: Jeani Hawking, MD;  Location: [facility];  Service: Endoscopy;  Laterality: N/A;   ENTEROSCOPY N/A 12/05/2021   Procedure: ENTEROSCOPY;  Surgeon: Jeani Hawking, MD;  Location: [facility];  Service: Gastroenterology;  Laterality: N/A;   ENTEROSCOPY N/A 12/25/2021   Procedure: ENTEROSCOPY;  Surgeon: Jeani Hawking, MD;  Location: [facility];  Service: Gastroenterology;  Laterality: N/A;  with endoscopic placement of video capsule   ESOPHAGOGASTRODUODENOSCOPY N/A 05/25/2016   Procedure: ESOPHAGOGASTRODUODENOSCOPY (EGD);  Surgeon: Jeani Hawking, MD;  Location: [facility] ENDOSCOPY;  Service: Endoscopy;  Laterality: N/A;   ESOPHAGOGASTRODUODENOSCOPY N/A 08/01/2018   Procedure: ESOPHAGOGASTRODUODENOSCOPY (EGD);  Surgeon: Jeani Hawking, MD;  Location: [facility] ENDOSCOPY;  Service: Endoscopy;  Laterality: N/A;   ESOPHAGOGASTRODUODENOSCOPY (EGD) WITH PROPOFOL N/A 09/28/2021   Procedure: ESOPHAGOGASTRODUODENOSCOPY (EGD) WITH PROPOFOL;  Surgeon: Jeani Hawking, MD;  Location: [facility] ENDOSCOPY;  Service: Endoscopy;  Laterality: N/A;   EYE SURGERY Right 1995 or 1996   Laser surgery for retinal  hemorrhage   GIVENS CAPSULE STUDY N/A 07/16/2018   Procedure: GIVENS CAPSULE STUDY;  Surgeon: Jeani Hawking, MD;  Location: [facility];  Service: Endoscopy;  Laterality: N/A;   GIVENS CAPSULE STUDY N/A 12/14/2021   Procedure: GIVENS CAPSULE STUDY;  Surgeon: Jeani Hawking, MD;  Location: [facility] ENDOSCOPY;  Service: Gastroenterology;  Laterality: N/A;   GIVENS CAPSULE STUDY N/A 12/25/2021   Procedure: GIVENS CAPSULE STUDY;  Surgeon: Jeani Hawking, MD;  Location: [facility] ENDOS[facility];  Service: Gastroenterology;  Laterality: N/A;   HEMOSTASIS CLIP PLACEMENT  06/29/2020   Procedure: HEMOSTASIS CLIP PLACEMENT;  Surgeon: Jeani Hawking, MD;  Location: [facility] ENDOSCOPY;  Service: Endoscopy;;   HEMOSTASIS CLIP PLACEMENT  01/05/2021   Procedure: HEMOSTASIS CLIP PLACEMENT;  Surgeon: Jeani Hawking, MD;  Location: [facility] ENDOSCOPY;  Service: Endoscopy;;   HOT HEMOSTASIS N/A 02/27/2018   Procedure: HOT HEMOSTASIS (ARGON PLASMA COAGULATION/BICAP);  Surgeon: Jeani Hawking, MD;  Location: [facility] ENDOSCOPY;  Service: Endoscopy;  Laterality: N/A;   HOT HEMOSTASIS N/A 08/01/2018   Procedure: HOT HEMOSTASIS (ARGON PLASMA COAGULATION/BICAP);  Surgeon: Jeani Hawking, MD;  Location: [facility] ENDOSCOPY;  Service: Endoscopy;  Laterality: N/A;   HOT HEMOSTASIS N/A 06/29/2020  Procedure: HOT HEMOSTASIS (ARGON PLASMA COAGULATION/BICAP);  Surgeon: Jeani Hawking, MD;  Location: [facility] ENDOSCOPY;  Service: Endoscopy;  Laterality: N/A;   HOT HEMOSTASIS N/A 01/05/2021   Procedure: HOT HEMOSTASIS (ARGON PLASMA COAGULATION/BICAP);  Surgeon: Jeani Hawking, MD;  Location: [facility] ENDOSCOPY;  Service: Endoscopy;  Laterality: N/A;   HOT HEMOSTASIS N/A 12/05/2021   Procedure: HOT HEMOSTASIS (ARGON PLASMA COAGULATION/BICAP);  Surgeon: Jeani Hawking, MD;  Location: [facility] ENDOSCOPY;  Service: Gastroenterology;  Laterality: N/A;   IR RADIOLOGIST EVAL & MGMT  12/14/2016   IR RADIOLOGIST EVAL & MGMT  07/26/2021   LEFT HEART CATH AND CORS/GRAFTS ANGIOGRAPHY N/A 01/09/2018   Procedure:  LEFT HEART CATH AND CORS/GRAFTS ANGIOGRAPHY;  Surgeon: Rinaldo Cloud, MD;  Location: [facility] INVASIVE CV LAB;  Service: Cardiovascular;  Laterality: N/A;   LEFT HEART CATHETERIZATION WITH CORONARY ANGIOGRAM N/A 08/03/2014   Procedure: LEFT HEART CATHETERIZATION WITH CORONARY ANGIOGRAM;  Surgeon: Ricki Rodriguez, MD;  Location: [facility] CATH LAB;  Service: Cardiovascular;  Laterality: N/A;   PATCH ANGIOPLASTY Right 09/26/2021   Procedure: PATCH ANGIOPLASTY RIGHT CAROTID;  Surgeon: Chuck Hint, MD;  Location: [facility] OR;  Service: Vascular;  Laterality: Right;   Post Coronary Artery  BPG  01/05/2000   Right jugular sheath removed   PR VEIN BYPASS GRAFT,AORTO-FEM-POP     ROTATOR CUFF REPAIR     Right    Social History:  reports that she quit smoking about 22 years ago. Her smoking use included cigarettes. She started smoking about 52 years ago. She has a 45 pack-year smoking history. She has never been exposed to tobacco smoke. She has never used smokeless tobacco. She reports that she does not drink alcohol and does not use drugs.   Allergies  Allergen Reactions   Avelox [Moxifloxacin Hcl In Nacl] Palpitations and Other (See Comments)    Caused Heart Attack    Azithromycin Swelling and Other (See Comments)    Patient reported past history of lip swelling   Codeine Other (See Comments)    Dr. Sharyn Lull advised patient not to take this medication   Doxycycline Swelling and Other (See Comments)    Mouth, lips, feet swelling   Hydromorphone Palpitations and Other (See Comments)    DILAUDID  -  Pt had a Heart Attack after taking Dilaudid.   Levaquin [Levofloxacin] Shortness Of Breath and Other (See Comments)    Chest pressure, SOB, "pain in between shoulder blades", sweaty -as reported by patient per experience in ED this afternoon   Vitamin D Analogs Swelling and Other (See Comments)    Face and lips swell, but no breathing issues   Nifedipine Er Other (See Comments)    Dropped the heart rate too  low   Octreotide     Chest pain, N/V   Zetia [Ezetimibe] Other (See Comments)    "Made me feel like my face was going to sleep/numb"   Oxycodone-Acetaminophen Other (See Comments)    Says it makes her "feel weird"   Risedronate Other (See Comments)    Chest pain    Family History  Problem Relation Age of Onset   Heart disease Mother        Heart Disease before age 41   Hypertension Mother    Hyperlipidemia Mother    Heart attack Mother    Clotting disorder Mother    Heart disease Father        Heart Disease before age 93   Heart attack Father    Hyperlipidemia Father    Hypertension  Father    Heart disease Brother        Heart Disease before age 9   Hyperlipidemia Brother    Hypertension Brother    Clotting disorder Brother    AAA (abdominal aortic aneurysm) Brother    Cerebral aneurysm Sister    Hypertension Sister    AAA (abdominal aortic aneurysm) Sister    Asthma Sister    Cerebral aneurysm Brother    Cancer Brother        Lung   Hypertension Brother    Heart attack Brother    Heart disease Brother        Aneurysm of Brain   Hypertension Brother    Heart disease Brother    Heart disease Brother    Stroke Son        Aneurysm of Stomach   AAA (abdominal aortic aneurysm) Son    Cancer Maternal Uncle        great uncle/cancer/type unknown     Prior to Admission medications   Medication Sig Start Date End Date Taking? Authorizing Provider  acetaminophen (TYLENOL) 500 MG tablet Take 500-1,000 mg by mouth every 6 (six) hours as needed (for pain).   Yes [provider]  albuterol (VENTOLIN HFA) 108 (90 Base) MCG/ACT inhaler Inhale 2 puffs into the lungs every 6 (six) hours as needed for wheezing or shortness of breath. 05/25/20  Yes Icard, Rachel Bo, DO  amiodarone (PACERONE) 200 MG tablet Take 100 mg by mouth daily. 04/03/23  Yes [provider]  atorvastatin (LIPITOR) 40 MG tablet Take 1 tablet (40 mg total) by mouth at bedtime. 09/29/21  Yes  Rhyne, Ames Coupe, PA-C  cetirizine (ZYRTEC) 10 MG tablet Take 10 mg by mouth daily as needed for allergies.    Yes [provider]  clopidogrel (PLAVIX) 75 MG tablet Take 1 tablet (75 mg total) by mouth daily. Patient taking differently: Take 37.5 mg by mouth in the morning. 07/08/20  Yes Almon Hercules, MD  dexlansoprazole (DEXILANT) 60 MG capsule Take 60 mg by mouth daily before breakfast.   Yes [provider]  fluorouracil (EFUDEX) 5 % cream Apply 1 application  topically See admin instructions. Apply to affected areas of the face once a day 04/25/22  Yes [provider]  furosemide (LASIX) 40 MG tablet Take 40 mg by mouth in the morning. 01/30/21  Yes [provider]  magnesium oxide (MAG-OX) 400 (240 Mg) MG tablet Take 400 mg by mouth daily after breakfast.   Yes [provider]  Magnesium Oxide 400 MG CAPS Take 400 mg by mouth in the morning.   Yes [provider]  nitroGLYCERIN (NITROSTAT) 0.4 MG SL tablet Place 1 tablet (0.4 mg total) under the tongue every 5 (five) minutes x 3 doses as needed for chest pain. 08/01/22  Yes Cobb, Ruby Cola, NP  olmesartan (BENICAR) 20 MG tablet Take 1 tablet (20 mg total) by mouth daily. Patient taking differently: Take 10 mg by mouth in the morning. 07/04/20  Yes Almon Hercules, MD  TRELEGY ELLIPTA 200-62.5-25 MCG/ACT AEPB USE 1 INHALATION BY MOUTH DAILY Patient taking differently: Inhale 1 puff into the lungs daily. 02/26/23  Yes Icard, Bradley L, DO  lidocaine (LIDODERM) 5 % Place 1 patch onto the skin daily. Remove & Discard patch within 12 hours or as directed by MD Patient not taking: Reported on 05/24/2023 09/10/22   Evlyn Kanner T, PA-C  pantoprazole (PROTONIX) 40 MG tablet TAKE 1 TABLET BY MOUTH TWICE  A DAY Patient not taking: Reported on 05/24/2023 08/24/22   Noemi Chapel, NP  Rimegepant Sulfate (NURTEC) 75 MG TBDP Take 1 tablet (75 mg total) by mouth as needed. Patient not taking: Reported  on 05/24/2023 04/02/23   Ocie Doyne, MD    Physical Exam: BP (!) 149/48 (BP Location: Right Arm)   Pulse 69   Temp 98 F (36.7 C) (Oral)   Resp 17   Ht 5\' 2"  (1.575 m)   Wt 65.8 kg   SpO2 100%   BMI 26.52 kg/m   General:  Alert, oriented, calm, in no acute distress  Eyes: EOMI, clear conjuctivae, white sclerea Neck: supple, no masses, trachea mildline  Cardiovascular: RRR, no murmurs or rubs, no peripheral edema  Respiratory: clear to auscultation bilaterally, no wheezes, no crackles  Abdomen: soft, nontender, nondistended, normal bowel tones heard  Skin: dry, no rashes  Musculoskeletal: no joint effusions, normal range of motion  Psychiatric: appropriate affect, normal speech  Neurologic: extraocular muscles intact, clear speech, moving all extremities with intact sensorium         Labs on Admission:  Basic Metabolic Panel: Recent Labs  Lab 05/23/23 1510 05/24/23 1325  NA 140 137  K 4.7 4.2  CL 105 105  CO2 27 24  GLUCOSE 97 88  BUN 28* 29*  CREATININE 1.96* 1.78*  CALCIUM 9.7 8.8*   Liver Function Tests: Recent Labs  Lab 05/23/23 1510 05/24/23 1325  AST 14* 17  ALT 7 11  ALKPHOS 71 62  BILITOT 0.3 0.5  PROT 7.4 7.2  ALBUMIN 4.2 3.7   No results for input(s): "LIPASE", "AMYLASE" in the last 168 hours. No results for input(s): "AMMONIA" in the last 168 hours. CBC: Recent Labs  Lab 05/23/23 1510 05/24/23 1325  WBC 8.1 6.4  NEUTROABS 4.4  --   HGB 8.5* 8.0*  HCT 26.9* 25.9*  MCV 92.4 92.8  PLT 316 292   Cardiac Enzymes: No results for input(s): "CKTOTAL", "CKMB", "CKMBINDEX", "TROPONINI" in the last 168 hours.  BNP (last 3 results) Recent Labs    07/16/22 1212 09/10/22 0825  BNP 45.3 68.0    ProBNP (last 3 results) No results for input(s): "PROBNP" in the last 8760 hours.  CBG: No results for input(s): "GLUCAP" in the last 168 hours.  Radiological Exams on Admission: No results found.  Assessment/Plan AKIYAH DEGROSS is a 82  y.o. 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    Recurrent blood loss anemia-likely slow bleed of her known small bowel angiodysplastic lesions.  She is hemodynamically stable with no evidence of rapid bleeding. -Observation admission -Cardiac monitoring -Discontinue blood thinners -Transfuse 1 unit PRBC now -IV PPI twice daily -Clear liquid diet, n.p.o. after midnight -Anticipate formal GI consult with Dr. Elnoria Howard in the morning  Hyperlipidemia-Lipitor  Hypertension-continue home ARB  GERD-Protonix as above  CAD/PVD-hold home Plavix  DVT prophylaxis: SCDs only due to GI bleed    Code Status: Limited: Do not attempt resuscitation (DNR) -DNR-LIMITED -Do Not Intubate/DNI   Consults called: EDP discussed with Dr. Elnoria Howard gastroenterology  Admission status: Observation  Time spent: 46 minutes  Cellie Dardis Sharlette Dense MD [facility] Pager [phone]  If 7PM-7AM, please contact night-coverage [email or web address] Password [email or web address]  05/24/2023, 3:15 PM

## 2023-05-25 DIAGNOSIS — Z8719 Personal history of other diseases of the digestive system: Secondary | ICD-10-CM | POA: Diagnosis not present

## 2023-05-25 DIAGNOSIS — K921 Melena: Secondary | ICD-10-CM

## 2023-05-25 DIAGNOSIS — K5521 Angiodysplasia of colon with hemorrhage: Secondary | ICD-10-CM | POA: Diagnosis not present

## 2023-05-25 DIAGNOSIS — D62 Acute posthemorrhagic anemia: Secondary | ICD-10-CM | POA: Diagnosis not present

## 2023-05-25 DIAGNOSIS — N184 Chronic kidney disease, stage 4 (severe): Secondary | ICD-10-CM

## 2023-05-25 LAB — BASIC METABOLIC PANEL
Anion gap: 9 (ref 5–15)
BUN: 27 mg/dL — ABNORMAL HIGH (ref 8–23)
CO2: 25 mmol/L (ref 22–32)
Calcium: 8.8 mg/dL — ABNORMAL LOW (ref 8.9–10.3)
Chloride: 104 mmol/L (ref 98–111)
Creatinine, Ser: 1.97 mg/dL — ABNORMAL HIGH (ref 0.44–1.00)
GFR, Estimated: 25 mL/min — ABNORMAL LOW (ref >60)
Glucose, Bld: 89 mg/dL (ref 70–99)
Potassium: 4.4 mmol/L (ref 3.5–5.1)
Sodium: 138 mmol/L (ref 135–145)

## 2023-05-25 LAB — CBC
HCT: 27 % — ABNORMAL LOW (ref 36.0–46.0)
Hemoglobin: 8.6 g/dL — ABNORMAL LOW (ref 12.0–15.0)
MCH: 29.1 pg (ref 26.0–34.0)
MCHC: 31.9 g/dL (ref 30.0–36.0)
MCV: 91.2 fL (ref 80.0–100.0)
Platelets: 248 10*3/uL (ref 150–400)
RBC: 2.96 MIL/uL — ABNORMAL LOW (ref 3.87–5.11)
RDW: 14.3 % (ref 11.5–15.5)
WBC: 6.2 10*3/uL (ref 4.0–10.5)
nRBC: 0 % (ref 0.0–0.2)

## 2023-05-25 MED ORDER — ACETAMINOPHEN 325 MG PO TABS
650.0000 mg | ORAL_TABLET | ORAL | Status: DC | PRN
  Administered 2023-05-25 (×2): 650 mg via ORAL
  Filled 2023-05-25 (×2): qty 2

## 2023-05-25 NOTE — Plan of Care (Signed)
Problem: Health Behavior/Discharge Planning: Goal: Ability to manage health-related needs will improve Outcome: Progressing   Problem: Clinical Measurements: Goal: Ability to maintain clinical measurements within normal limits will improve Outcome: Progressing Goal: Will remain free from infection Outcome: Progressing Goal: Diagnostic test results will improve Outcome: Progressing

## 2023-05-25 NOTE — Assessment & Plan Note (Signed)
-   followed by Dr. Elnoria Howard outpatient; known history of jejunal AVMs requiring treatment with APC in 2023 - presented with melena and weakness; Hgb down to 8 g/dL on admission.  Baseline roughly around 9 to 10 g/dL -Plavix held during hospitalization - Hemoglobin stabilized after blood transfusion and stools transitioned back to brown - resumed plavix at discharge and she plans to followup with GI outpatient

## 2023-05-25 NOTE — Plan of Care (Signed)
Problem: Education: Goal: Knowledge of General Education information will improve Description: Including pain rating scale, medication(s)/side effects and non-pharmacologic comfort measures Outcome: Progressing   Problem: Health Behavior/Discharge Planning: Goal: Ability to manage health-related needs will improve Outcome: Progressing   Problem: Clinical Measurements: Goal: Will remain free from infection Outcome: Progressing   Problem: Activity: Goal: Risk for activity intolerance will decrease Outcome: Progressing   Problem: Nutrition: Goal: Adequate nutrition will be maintained Outcome: Progressing   Problem: Coping: Goal: Level of anxiety will decrease Outcome: Progressing

## 2023-05-25 NOTE — Consult Note (Addendum)
GI Consult Note Covering for Drs. Nicholes Mango   Referring Provider: Lewie Chamber, MD Primary Care Physician:  Tracey Harries, MD Primary Gastroenterologist:  Jeani Hawking, MD  Reason for Consultation:  melena, symptomatic anemia   Assessment    Recurrent GI bleeding from known small bowel AVMs Acute blood loss anemia on chronic anemia GERD COPD CAD, S/P CABG PVD   Recommendations    Trend CBC, transfuse to maintain Hgb greater than 7-8 Hold Plavix 37.5 mg qd. Last dose was Friday morning. Continue pantoprazole After Plavix wash out consider push enteroscopy with Dr. Elnoria Howard on Monday Consider VCE per Dr. Elnoria Howard Dr. Elnoria Howard will assume care on Monday   HPI: [name] is a 82 y.o. 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 She has had multiple endoscopic procedures for ablation of AVMs.  She has noted dark stools for the past week and yesterday she developed multiple episodes of melena and weakness.  She was seen in her oncologist office yesterday and her hemoglobin was 8.5.  Her hemoglobin subsequently dropped to 8.  She has had more episodes of melena since being hospitalized.  She has remained hemodynamically stable.  She takes Plavix 37.5 mg daily.  She notes mild periumbilical discomfort associated with increased frequency of bowel movements.  Colonoscopy Feb 2023: Sigmoid diverticulosis Push enteroscopy April 2023: 3rd & 4th duodenum AVMs, proximal jejunal AVMs - ablated    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HCC)    Arnold-Chiari malformation (HCC) 1998   Asthma    Bilateral occipital neuralgia  05/28/2013   Blood in stool    last week of aug 2018   CAP (community acquired pneumonia) 10/22/2015   Chronic kidney disease    COPD (chronic obstructive pulmonary disease) (HCC)    Coronary artery disease    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   Diverticulitis    Dyspnea    with exertion   Gastroesophageal reflux disease    occ   Glaucoma    right eye   Headache syndrome 11/27/2018   Hiatal hernia    History of shingles 06/23/2018   Hyperlipidemia    Hypertension    Lung cancer (HCC) dx 2018   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   Myocardial infarction (HCC) 01/01/2000   Cardiac catheterization   Peripheral vascular disease (HCC)    stents in legs x 2 or 3   Pneumonia    last time winter 2017 -2018   PONV (postoperative nausea and vomiting)    occassionally, last colonscopy did ok with anesthesia   Primary cancer of right lower lobe of lung (HCC) 04/25/2016   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   TIA (transient ischemic attack) 02/11/2019   Wears dentures    Full set   Wears glasses     Past Surgical History:  Procedure Laterality Date   ABDOMINAL HYSTERECTOMY     APPENDECTOMY     Arnold-chiari malformation repair  1998   Suboccipital craniectomy   CAROTID ENDARTERECTOMY  03/29/2010   Left  CEA   CHOLECYSTECTOMY     Gall Bladder   COLONOSCOPY WITH PROPOFOL N/A 04/22/2015   Procedure: COLONOSCOPY WITH PROPOFOL;  Surgeon: Jeani Hawking, MD;  Location: [facility];  Service: Endoscopy;  Laterality: N/A;   COLONOSCOPY WITH PROPOFOL N/A 05/25/2016   Procedure: COLONOSCOPY WITH PROPOFOL;  Surgeon: Jeani Hawking, MD;  Location: [facility];  Service: Endoscopy;  Laterality: N/A;   COLONOSCOPY WITH PROPOFOL N/A 05/03/2017   Procedure: COLONOSCOPY WITH PROPOFOL;  Surgeon: Jeani Hawking, MD;  Location: [facility];  Service: Endoscopy;  Laterality: N/A;   COLONOSCOPY WITH PROPOFOL N/A 06/29/2020   Procedure: COLONOSCOPY WITH PROPOFOL;  Surgeon: Jeani Hawking, MD;  Location: [facility];  Service: Endoscopy;  Laterality: N/A;   COLONOSCOPY WITH PROPOFOL N/A 01/05/2021   Procedure: COLONOSCOPY WITH PROPOFOL;  Surgeon: Jeani Hawking, MD;  Location: [facility];  Service: Endoscopy;  Laterality: N/A;   COLONOSCOPY WITH PROPOFOL N/A 09/28/2021   Procedure: COLONOSCOPY WITH PROPOFOL;  Surgeon: Jeani Hawking, MD;  Location: [facility] ENDOSCOPY;  Service: Endoscopy;  Laterality: N/A;   CORNEAL TRANSPLANT     Right   CORONARY ARTERY BYPASS GRAFT  01/01/2000   x 3   ENDARTERECTOMY Right 09/26/2021   Procedure: RIGHT CAROTID ENDARTERECTOMY;  Surgeon: Chuck Hint, MD;  Location: [facility] OR;  Service: Vascular;  Laterality: Right;   ENTEROSCOPY N/A 02/27/2018   Procedure: ENTEROSCOPY;  Surgeon: Jeani Hawking, MD;  Location: [facility];  Service: Endoscopy;  Laterality: N/A;   ENTEROSCOPY N/A 07/18/2018   Procedure: ENTEROSCOPY;  Surgeon: Jeani Hawking, MD;  Location: [facility];  Service: Endoscopy;  Laterality: N/A;   ENTEROSCOPY N/A 06/29/2020   Procedure: ENTEROSCOPY;  Surgeon: Jeani Hawking, MD;  Location: [facility];  Service: Endoscopy;  Laterality: N/A;   ENTEROSCOPY N/A 12/05/2021   Procedure: ENTEROSCOPY;  Surgeon: Jeani Hawking, MD;  Location: [facility];  Service: Gastroenterology;  Laterality: N/A;   ENTEROSCOPY N/A 12/25/2021   Procedure: ENTEROSCOPY;  Surgeon: Jeani Hawking, MD;  Location: [facility];  Service: Gastroenterology;  Laterality: N/A;  with endoscopic placement of video capsule   ESOPHAGOGASTRODUODENOSCOPY N/A 05/25/2016   Procedure: ESOPHAGOGASTRODUODENOSCOPY (EGD);  Surgeon: Jeani Hawking, MD;  Location: [facility];  Service: Endoscopy;  Laterality: N/A;   ESOPHAGOGASTRODUODENOSCOPY N/A 08/01/2018   Procedure: ESOPHAGOGASTRODUODENOSCOPY (EGD);  Surgeon: Jeani Hawking, MD;  Location: [facility];  Service: Endoscopy;  Laterality: N/A;   ESOPHAGOGASTRODUODENOSCOPY (EGD) WITH PROPOFOL N/A 09/28/2021   Procedure:  ESOPHAGOGASTRODUODENOSCOPY (EGD) WITH PROPOFOL;  Surgeon: Jeani Hawking, MD;  Location: [facility] ENDOSCOPY;  Service: Endoscopy;  Laterality: N/A;   EYE SURGERY Right 1995 or 1996   Laser surgery for retinal hemorrhage   GIVENS CAPSULE STUDY N/A 07/16/2018   Procedure: GIVENS CAPSULE STUDY;  Surgeon: Jeani Hawking, MD;  Location: [facility];  Service: Endoscopy;  Laterality: N/A;   GIVENS CAPSULE STUDY N/A 12/14/2021   Procedure: GIVENS CAPSULE STUDY;  Surgeon: Jeani Hawking, MD;  Location: [facility] ENDOSCOPY;  Service: Gastroenterology;  Laterality: N/A;   GIVENS CAPSULE STUDY N/A 12/25/2021   Procedure: GIVENS CAPSULE STUDY;  Surgeon: Jeani Hawking, MD;  Location: [facility];  Service: Gastroenterology;  Laterality: N/A;   HEMOSTASIS CLIP PLACEMENT  06/29/2020   Procedure: HEMOSTASIS CLIP PLACEMENT;  Surgeon: Jeani Hawking, MD;  Location: [facility];  Service: Endoscopy;;   HEMOSTASIS CLIP PLACEMENT  01/05/2021   Procedure: HEMOSTASIS CLIP PLACEMENT;  Surgeon: Jeani Hawking, MD;  Location: [facility];  Service: Endoscopy;;   HOT HEMOSTASIS N/A 02/27/2018   Procedure: HOT HEMOSTASIS (ARGON PLASMA COAGULATION/BICAP);  Surgeon: Jeani Hawking, MD;  Location: [facility];  Service: Endoscopy;  Laterality: N/A;   HOT HEMOSTASIS N/A 08/01/2018   Procedure: HOT HEMOSTASIS (  ARGON PLASMA COAGULATION/BICAP);  Surgeon: Jeani Hawking, MD;  Location: [facility];  Service: Endoscopy;  Laterality: N/A;   HOT HEMOSTASIS N/A 06/29/2020   Procedure: HOT HEMOSTASIS (ARGON PLASMA COAGULATION/BICAP);  Surgeon: Jeani Hawking, MD;  Location: [facility];  Service: Endoscopy;  Laterality: N/A;   HOT HEMOSTASIS N/A 01/05/2021   Procedure: HOT HEMOSTASIS (ARGON PLASMA COAGULATION/BICAP);  Surgeon: Jeani Hawking, MD;  Location: [facility];  Service: Endoscopy;  Laterality: N/A;   HOT HEMOSTASIS N/A 12/05/2021   Procedure: HOT HEMOSTASIS (ARGON PLASMA COAGULATION/BICAP);  Surgeon: Jeani Hawking, MD;  Location: [facility];   Service: Gastroenterology;  Laterality: N/A;   IR RADIOLOGIST EVAL & MGMT  12/14/2016   IR RADIOLOGIST EVAL & MGMT  07/26/2021   LEFT HEART CATH AND CORS/GRAFTS ANGIOGRAPHY N/A 01/09/2018   Procedure: LEFT HEART CATH AND CORS/GRAFTS ANGIOGRAPHY;  Surgeon: Rinaldo Cloud, MD;  Location: [facility] INVASIVE CV LAB;  Service: Cardiovascular;  Laterality: N/A;   LEFT HEART CATHETERIZATION WITH CORONARY ANGIOGRAM N/A 08/03/2014   Procedure: LEFT HEART CATHETERIZATION WITH CORONARY ANGIOGRAM;  Surgeon: Ricki Rodriguez, MD;  Location: [facility] CATH LAB;  Service: Cardiovascular;  Laterality: N/A;   PATCH ANGIOPLASTY Right 09/26/2021   Procedure: PATCH ANGIOPLASTY RIGHT CAROTID;  Surgeon: Chuck Hint, MD;  Location: [facility] OR;  Service: Vascular;  Laterality: Right;   Post Coronary Artery  BPG  01/05/2000   Right jugular sheath removed   PR VEIN BYPASS GRAFT,AORTO-FEM-POP     ROTATOR CUFF REPAIR     Right    Prior to Admission medications   Medication Sig Start Date End Date Taking? Authorizing Provider  acetaminophen (TYLENOL) 500 MG tablet Take 500-1,000 mg by mouth every 6 (six) hours as needed (for pain).   Yes [provider]  albuterol (VENTOLIN HFA) 108 (90 Base) MCG/ACT inhaler Inhale 2 puffs into the lungs every 6 (six) hours as needed for wheezing or shortness of breath. 05/25/20  Yes Icard, Rachel Bo, DO  amiodarone (PACERONE) 200 MG tablet Take 100 mg by mouth daily. 04/03/23  Yes [provider]  atorvastatin (LIPITOR) 40 MG tablet Take 1 tablet (40 mg total) by mouth at bedtime. 09/29/21  Yes Rhyne, Ames Coupe, PA-C  cetirizine (ZYRTEC) 10 MG tablet Take 10 mg by mouth daily as needed for allergies.    Yes [provider]  clopidogrel (PLAVIX) 75 MG tablet Take 1 tablet (75 mg total) by mouth daily. Patient taking differently: Take 37.5 mg by mouth in the morning. 07/08/20  Yes Almon Hercules, MD  dexlansoprazole (DEXILANT) 60 MG capsule Take 60 mg by mouth daily before  breakfast.   Yes [provider]  fluorouracil (EFUDEX) 5 % cream Apply 1 application  topically See admin instructions. Apply to affected areas of the face once a day 04/25/22  Yes [provider]  furosemide (LASIX) 40 MG tablet Take 40 mg by mouth in the morning. 01/30/21  Yes [provider]  magnesium oxide (MAG-OX) 400 (240 Mg) MG tablet Take 400 mg by mouth daily after breakfast.   Yes [provider]  nitroGLYCERIN (NITROSTAT) 0.4 MG SL tablet Place 1 tablet (0.4 mg total) under the tongue every 5 (five) minutes x 3 doses as needed for chest pain. 08/01/22  Yes Cobb, Ruby Cola, NP  olmesartan (BENICAR) 20 MG tablet Take 1 tablet (20 mg total) by mouth daily. Patient taking differently: Take 10 mg by mouth in the morning. 07/04/20  Yes Almon Hercules, MD  TRELEGY ELLIPTA 200-62.5-25 MCG/ACT AEPB  USE 1 INHALATION BY MOUTH DAILY Patient taking differently: Inhale 1 puff into the lungs daily. 02/26/23  Yes Icard, Bradley L, DO  lidocaine (LIDODERM) 5 % Place 1 patch onto the skin daily. Remove & Discard patch within 12 hours or as directed by MD Patient not taking: Reported on 05/24/2023 09/10/22   Evlyn Kanner T, PA-C  pantoprazole (PROTONIX) 40 MG tablet TAKE 1 TABLET BY MOUTH TWICE A DAY Patient not taking: Reported on 05/24/2023 08/24/22   Cobb, Ruby Cola, NP  Rimegepant Sulfate (NURTEC) 75 MG TBDP Take 1 tablet (75 mg total) by mouth as needed. Patient not taking: Reported on 05/24/2023 04/02/23   Ocie Doyne, MD    Current Facility-Administered Medications  Medication Dose Route Frequency Provider Last Rate Last Admin   0.9 %  sodium chloride infusion (Manually program via Guardrails IV Fluids)   Intravenous Once Long, Arlyss Repress, MD       albuterol (PROVENTIL) (2.5 MG/3ML) 0.083% nebulizer solution 2.5 mg  2.5 mg Nebulization Q2H PRN Kirby Crigler, Mir M, MD       amiodarone (PACERONE) tablet 100 mg  100 mg Oral Daily Kirby Crigler, Mir M, MD        atorvastatin (LIPITOR) tablet 40 mg  40 mg Oral QHS Kirby Crigler, Mir M, MD   40 mg at 05/24/23 2152   fluticasone furoate-vilanterol (BREO ELLIPTA) 200-25 MCG/ACT 1 puff  1 puff Inhalation Daily Kirby Crigler, Mir M, MD       And   umeclidinium bromide (INCRUSE ELLIPTA) 62.5 MCG/ACT 1 puff  1 puff Inhalation Daily Kirby Crigler, Mir M, MD       furosemide (LASIX) tablet 40 mg  40 mg Oral Daily Kirby Crigler, Mir M, MD       irbesartan (AVAPRO) tablet 75 mg  75 mg Oral Daily Kirby Crigler, Mir M, MD       loratadine (CLARITIN) tablet 10 mg  10 mg Oral Daily PRN Kirby Crigler, Mir M, MD       ondansetron [facility]) tablet 4 mg  4 mg Oral Q6H PRN Kirby Crigler, Mir M, MD       Or   ondansetron [facility]) injection 4 mg  4 mg Intravenous Q6H PRN Kirby Crigler, Mir M, MD       pantoprazole (PROTONIX) injection 40 mg  40 mg Intravenous Q12H Kirby Crigler, Mir M, MD   40 mg at 05/24/23 2152   traZODone (DESYREL) tablet 25 mg  25 mg Oral QHS PRN Maryln Gottron, MD        Allergies as of 05/24/2023 - Review Complete 05/24/2023  Allergen Reaction Noted   Avelox [moxifloxacin hcl in nacl] Palpitations and Other (See Comments) 11/27/2011   Azithromycin Swelling and Other (See Comments) 01/23/2017   Codeine Other (See Comments) 11/27/2011   Doxycycline Swelling and Other (See Comments)    Hydromorphone Palpitations and Other (See Comments) 01/05/2015   Levaquin [levofloxacin] Shortness Of Breath and Other (See Comments) 10/22/2015   Vitamin d analogs Swelling and Other (See Comments) 01/15/2020   Nifedipine er Other (See Comments) 11/18/2021   Octreotide  12/27/2021   Zetia [ezetimibe] Other (See Comments) 05/24/2023   Oxycodone-acetaminophen Other (See Comments) 11/27/2011   Risedronate Other (See Comments) 08/02/2014    Family History  Problem Relation Age of Onset   Heart disease Mother        Heart Disease before age 8   Hypertension Mother    Hyperlipidemia Mother    Heart attack Mother    Clotting disorder  Mother    Heart  disease Father        Heart Disease before age 75   Heart attack Father    Hyperlipidemia Father    Hypertension Father    Heart disease Brother        Heart Disease before age 73   Hyperlipidemia Brother    Hypertension Brother    Clotting disorder Brother    AAA (abdominal aortic aneurysm) Brother    Cerebral aneurysm Sister    Hypertension Sister    AAA (abdominal aortic aneurysm) Sister    Asthma Sister    Cerebral aneurysm Brother    Cancer Brother        Lung   Hypertension Brother    Heart attack Brother    Heart disease Brother        Aneurysm of Brain   Hypertension Brother    Heart disease Brother    Heart disease Brother    Stroke Son        Aneurysm of Stomach   AAA (abdominal aortic aneurysm) Son    Cancer Maternal Uncle        great uncle/cancer/type unknown    Social History   Socioeconomic History   Marital status: Widowed    Spouse name: Not on file   Number of children: 4   Years of education: 7TH   Highest education level: Not on file  Occupational History   Not on file  Tobacco Use   Smoking status: Former    Current packs/day: 0.00    Average packs/day: 1.5 packs/day for 30.0 years (45.0 ttl pk-yrs)    Types: Cigarettes    Start date: 08/13/1970    Quit date: 08/13/2000    Years since quitting: 22.7    Passive exposure: Never   Smokeless tobacco: Never  Vaping Use   Vaping status: Never Used  Substance and Sexual Activity   Alcohol use: No    Alcohol/week: 0.0 standard drinks of alcohol   Drug use: No   Sexual activity: Never  Other Topics Concern   Not on file  Social History Narrative   Lives at home w/ her son   Right-handed   Drinks coffee and [employer] daily   Social Determinants of Health   Financial Resource Strain: Low Risk  (03/02/2023)   Received from [facility]   Overall Financial Resource Strain (CARDIA)    Difficulty of Paying Living Expenses: Not hard at all  Food Insecurity: No Food Insecurity  (05/24/2023)   Hunger Vital Sign    Worried About Running Out of Food in the Last Year: Never true    Ran Out of Food in the Last Year: Never true  Transportation Needs: No Transportation Needs (05/24/2023)   PRAPARE - Administrator, Civil Service (Medical): No    Lack of Transportation (Non-Medical): No  Physical Activity: Unknown (03/02/2023)   Received from [facility]   Exercise Vital Sign    Days of Exercise per Week: 0 days    Minutes of Exercise per Session: Not on file  Stress: Stress Concern Present (03/02/2023)   Received from [facility] of Occupational Health - Occupational Stress Questionnaire    Feeling of Stress : To some extent  Social Connections: Somewhat Isolated (09/09/2022)   Received from [facility]   Social Network    How would you rate your social network (family, work, friends)?: Restricted participation with some degree of social isolation  Intimate Partner Violence: Not At Risk (05/24/2023)  Humiliation, Afraid, Rape, and Kick questionnaire    Fear of Current or Ex-Partner: No    Emotionally Abused: No    Physically Abused: No    Sexually Abused: No    Review of Systems: Gen: Denies any fever, chills, sweats, anorexia, fatigue, weakness, malaise, weight loss, and sleep disorder CV: Denies chest pain, angina, palpitations, syncope, orthopnea, PND, peripheral edema, and claudication. Resp: Denies dyspnea at rest, dyspnea with exercise, cough, sputum, wheezing, coughing up blood, and pleurisy. GI: Denies vomiting blood, jaundice, and fecal incontinence.   Denies dysphagia or odynophagia. GU : Denies urinary burning, blood in urine, urinary frequency, urinary hesitancy, nocturnal urination, and urinary incontinence. MS: Denies joint pain, limitation of movement, and swelling, stiffness, low back pain, extremity pain. Denies muscle weakness, cramps, atrophy.  Derm: Denies rash, itching, dry skin, hives,  moles, warts, or unhealing ulcers.  Psych: Denies depression, anxiety, memory loss, suicidal ideation, hallucinations, paranoia, and confusion. Heme: Denies bruising, bleeding, and enlarged lymph nodes. Neuro:  Denies any headaches, dizziness, paresthesias. Endo:  Denies any problems with DM, thyroid, adrenal function.  Physical Exam: Vital signs in last 24 hours: Temp:  [97.8 F (36.6 C)-98.2 F (36.8 C)] 98 F (36.7 C) (10/12 0502) Pulse Rate:  [44-69] 44 (10/12 0502) Resp:  [16-18] 16 (10/12 0502) BP: (128-189)/(38-65) 142/38 (10/12 0502) SpO2:  [93 %-100 %] 96 % (10/12 0502) Weight:  [65.8 kg] 65.8 kg (10/11 1059) Last BM Date : 05/24/23  General:  Alert, well-developed, well-nourished, elderly, in NAD Head:  Normocephalic and atraumatic. Eyes:  Sclera clear, no icterus. Conjunctiva pink. Ears:  Normal auditory acuity. Nose:  No deformity, discharge, or lesions. Mouth:  No deformity or lesions. Oropharynx pink & moist. Neck:  Supple; no masses or thyromegaly. Chest:  Clear throughout to auscultation. No wheezes, crackles, or rhonchi. No acute distress. Heart:  Regular rate and rhythm; no murmurs, clicks, rubs, or gallops. Abdomen:  Soft, nontender and nondistended. No masses, hepatosplenomegaly or hernias noted. Normal bowel sounds, without guarding, and without rebound.   Rectal:  Deferred    Msk:  Symmetrical without gross deformities. Normal posture. Pulses:  Normal pulses noted. Extremities:  Without clubbing or edema. Neurologic:  Alert and  oriented x4;  grossly normal neurologically. Skin:  Intact without significant lesions or rashes. Cervical Nodes:  No significant cervical adenopathy. Psych:  Alert and cooperative. Normal mood and affect.  Intake/Output from previous day: 10/11 0701 - 10/12 0700 In: 506.8 [Blood:406.3; IV Piggyback:100.5] Out: -  Intake/Output this shift: No intake/output data recorded.  Previous Endoscopies: See HPI  Lab Results: Recent  Labs    05/23/23 1510 05/24/23 1325 05/25/23 0554  WBC 8.1 6.4 6.2  HGB 8.5* 8.0* 8.6*  HCT 26.9* 25.9* 27.0*  PLT 316 292 248   BMET Recent Labs    05/23/23 1510 05/24/23 1325 05/25/23 0554  NA 140 137 138  K 4.7 4.2 4.4  CL 105 105 104  CO2 27 24 25   GLUCOSE 97 88 89  BUN 28* 29* 27*  CREATININE 1.96* 1.78* 1.97*  CALCIUM 9.7 8.8* 8.8*   LFT Recent Labs    05/24/23 1325  PROT 7.2  ALBUMIN 3.7  AST 17  ALT 11  ALKPHOS 62  BILITOT 0.5   PT/INR Recent Labs    05/24/23 1325  LABPROT 14.6  INR 1.1     LOS: 0 days   Zykeria Laguardia T. Russella Dar, MD  05/25/2023, 10:28 AM See Loretha Stapler, [facility], to contact our on call provider

## 2023-05-25 NOTE — Hospital Course (Addendum)
[name] is an 82 yo female with PMH CAD s/p CABG, PVD, small bowel angiodysplasias with hx GIB, TIA, GERD, COPD, CKD4, anemia, diverticulosis, glaucoma, HTN, HLD. She presented with worsening weakness, melanotic stools.  She had outpatient blood work checked and was found to have lower hemoglobin than baseline and was referred to the ER for further workup. She received 1 unit PRBC transfusion on 05/24/2023.  Hemoglobin responded and remained stable after transfusion.  Stools also transitioned back to brown in color. She was amenable with discharging and following up with GI outpatient.  Since hemoglobin remained stable and stools have transitioned back to normal color and consistency, she was resumed back on Plavix at time of discharge.

## 2023-05-25 NOTE — Assessment & Plan Note (Signed)
-   hold plavix during GIB workup

## 2023-05-25 NOTE — Progress Notes (Addendum)
Progress Note    [name]   VHQ:[id number]  DOB: 04-Nov-1940  DOA: 05/24/2023     0 PCP: Tracey Harries, MD  Initial CC: Decreased hemoglobin, dark stools  Hospital Course: [name] is an 82 yo female with PMH CAD s/p CABG, PVD, small bowel angiodysplasias with hx GIB, TIA, GERD, COPD, CKD, anemia, diverticulosis, glaucoma, HTN, HLD. She presented with worsening weakness, melanotic stools.  She had outpatient blood work checked and was found to have lower hemoglobin than baseline and was referred to the ER for further workup.  Interval History:  Patient endorses ongoing melanotic stools at home with associated weakness prior to admission.  She underwent further evaluation with hemoglobin testing which was found to be lower than her baseline. This morning she is resting in bed appearing comfortable with daughter present bedside.  She received 1 unit of blood overnight after admission.  Assessment and Plan: * Melena - followed by Dr. Elnoria Howard outpatient; known history of jejunal AVMs requiring treatment with APC in 2023 - now presents with melena and weakness; Hgb down to 8 g/dL on admission.  Baseline roughly around 9 to 10 g/dL - GI consulted on admission, appreciate assistance; tentative plan for trending Hgb for now and possible VCE next week - continue protonix; holding plavix for now  History of intestinal angiodysplasia - followed by Dr. Elnoria Howard outpatient; known history of jejunal AVMs requiring treatment with APC in 2023 -see melena workup  Chronic diastolic CHF (congestive heart failure) (HCC) - no s/s exac - hold lasix for now - continue ARB and statin  History of transient ischemic attack (TIA) - hold plavix during GIB workup  CKD (chronic kidney disease) stage 4, GFR 15-29 ml/min (HCC) - patient has history of CKD4. Baseline creat ~ 1.8, eGFR~ 28   Peripheral vascular disease (HCC) - continue statin -Plavix on hold  CAD (coronary artery disease) - Continue  Lipitor, ARB - Plavix on hold  HLD (hyperlipidemia) - Continue Lipitor  Essential hypertension - Continue ARB  GERD (gastroesophageal reflux disease) - Continue Protonix   Old records reviewed in assessment of this patient  Antimicrobials:   DVT prophylaxis:  SCDs Start: 05/24/23 1514   Code Status:   Code Status: Limited: Do not attempt resuscitation (DNR) -DNR-LIMITED -Do Not Intubate/DNI   Mobility Assessment (Last 72 Hours)     Mobility Assessment     Row Name 05/25/23 0835 05/24/23 2150 05/24/23 1700       Does patient have an order for bedrest or is patient medically unstable No - Continue assessment No - Continue assessment No - Continue assessment     What is the highest level of mobility based on the progressive mobility assessment? Level 5 (Walks with assist in room/hall) - Balance while stepping forward/back and can walk in room with assist - Complete Level 5 (Walks with assist in room/hall) - Balance while stepping forward/back and can walk in room with assist - Complete Level 5 (Walks with assist in room/hall) - Balance while stepping forward/back and can walk in room with assist - Complete              Barriers to discharge: None Disposition Plan: Home Status is: Obs needs inpt  Objective: Blood pressure (!) 142/38, pulse (!) 44, temperature 98 F (36.7 C), temperature source Oral, resp. rate 16, height 5\' 2"  (1.575 m), weight 65.8 kg, SpO2 96%.  Examination:  Physical Exam Constitutional:      Appearance: Normal appearance.  HENT:  Head: Normocephalic and atraumatic.     Mouth/Throat:     Mouth: Mucous membranes are moist.  Eyes:     Extraocular Movements: Extraocular movements intact.  Cardiovascular:     Rate and Rhythm: Normal rate and regular rhythm.  Pulmonary:     Effort: Pulmonary effort is normal. No respiratory distress.     Breath sounds: Normal breath sounds. No wheezing.  Abdominal:     General: Bowel sounds are normal.  There is no distension.     Palpations: Abdomen is soft.     Tenderness: There is no abdominal tenderness.  Musculoskeletal:        General: Normal range of motion.     Cervical back: Normal range of motion and neck supple.  Skin:    General: Skin is warm and dry.  Neurological:     General: No focal deficit present.     Mental Status: She is alert.  Psychiatric:        Mood and Affect: Mood normal.      Consultants:  GI  Procedures:    Data Reviewed: Results for orders placed or performed during the hospital encounter of 05/24/23 (from the past 24 hour(s))  Comprehensive metabolic panel     Status: Abnormal   Collection Time: 05/24/23  1:25 PM  Result Value Ref Range   Sodium 137 135 - 145 mmol/L   Potassium 4.2 3.5 - 5.1 mmol/L   Chloride 105 98 - 111 mmol/L   CO2 24 22 - 32 mmol/L   Glucose, Bld 88 70 - 99 mg/dL   BUN 29 (H) 8 - 23 mg/dL   Creatinine, Ser 9.56 (H) 0.44 - 1.00 mg/dL   Calcium 8.8 (L) 8.9 - 10.3 mg/dL   Total Protein 7.2 6.5 - 8.1 g/dL   Albumin 3.7 3.5 - 5.0 g/dL   AST 17 15 - 41 U/L   ALT 11 0 - 44 U/L   Alkaline Phosphatase 62 38 - 126 U/L   Total Bilirubin 0.5 0.3 - 1.2 mg/dL   GFR, Estimated 29 (L) >60 mL/min   Anion gap 8 5 - 15  CBC     Status: Abnormal   Collection Time: 05/24/23  1:25 PM  Result Value Ref Range   WBC 6.4 4.0 - 10.5 K/uL   RBC 2.79 (L) 3.87 - 5.11 MIL/uL   Hemoglobin 8.0 (L) 12.0 - 15.0 g/dL   HCT 21.3 (L) 08.6 - 57.8 %   MCV 92.8 80.0 - 100.0 fL   MCH 28.7 26.0 - 34.0 pg   MCHC 30.9 30.0 - 36.0 g/dL   RDW 46.9 62.9 - 52.8 %   Platelets 292 150 - 400 K/uL   nRBC 0.0 0.0 - 0.2 %  Protime-INR - (order if Patient is taking Coumadin / Warfarin)     Status: None   Collection Time: 05/24/23  1:25 PM  Result Value Ref Range   Prothrombin Time 14.6 11.4 - 15.2 seconds   INR 1.1 0.8 - 1.2  Prepare RBC (crossmatch)     Status: None   Collection Time: 05/24/23  2:46 PM  Result Value Ref Range   Order Confirmation       ORDER PROCESSED BY BLOOD BANK Performed at [facility], [address] [place], [place] [address]   Basic metabolic panel     Status: Abnormal   Collection Time: 05/25/23  5:54 AM  Result Value Ref Range   Sodium 138 135 - 145 mmol/L  Potassium 4.4 3.5 - 5.1 mmol/L   Chloride 104 98 - 111 mmol/L   CO2 25 22 - 32 mmol/L   Glucose, Bld 89 70 - 99 mg/dL   BUN 27 (H) 8 - 23 mg/dL   Creatinine, Ser 1.61 (H) 0.44 - 1.00 mg/dL   Calcium 8.8 (L) 8.9 - 10.3 mg/dL   GFR, Estimated 25 (L) >60 mL/min   Anion gap 9 5 - 15  CBC     Status: Abnormal   Collection Time: 05/25/23  5:54 AM  Result Value Ref Range   WBC 6.2 4.0 - 10.5 K/uL   RBC 2.96 (L) 3.87 - 5.11 MIL/uL   Hemoglobin 8.6 (L) 12.0 - 15.0 g/dL   HCT 09.6 (L) 04.5 - 40.9 %   MCV 91.2 80.0 - 100.0 fL   MCH 29.1 26.0 - 34.0 pg   MCHC 31.9 30.0 - 36.0 g/dL   RDW 81.1 91.4 - 78.2 %   Platelets 248 150 - 400 K/uL   nRBC 0.0 0.0 - 0.2 %    I have reviewed pertinent nursing notes, vitals, labs, and images as necessary. I have ordered labwork to follow up on as indicated.  I have reviewed the last notes from staff over past 24 hours. I have discussed patient's care plan and test results with nursing staff, CM/SW, and other staff as appropriate.  Time spent: Greater than 50% of the 55 minute visit was spent in counseling/coordination of care for the patient as laid out in the A&P.   LOS: 0 days   Lewie Chamber, MD [facility] 05/25/2023, 12:24 PM

## 2023-05-25 NOTE — Assessment & Plan Note (Addendum)
-   followed by Dr. Elnoria Howard outpatient; known history of jejunal AVMs requiring treatment with APC in 2023 -see melena workup

## 2023-05-25 NOTE — Assessment & Plan Note (Signed)
-   Continue Protonix

## 2023-05-25 NOTE — Assessment & Plan Note (Signed)
-   continue statin -Plavix resumed at discharge

## 2023-05-25 NOTE — Assessment & Plan Note (Signed)
--  Continue ARB

## 2023-05-25 NOTE — Assessment & Plan Note (Signed)
-   Continue Lipitor, ARB, Plavix

## 2023-05-25 NOTE — Plan of Care (Signed)

## 2023-05-25 NOTE — Assessment & Plan Note (Signed)
-   patient has history of CKD4. Baseline creat ~ 1.8, eGFR~ 28

## 2023-05-25 NOTE — Assessment & Plan Note (Addendum)
-   no s/s exac - resume lasix at discharge  - continue ARB and statin

## 2023-05-25 NOTE — Assessment & Plan Note (Signed)
-  Continue Lipitor °

## 2023-05-26 DIAGNOSIS — K921 Melena: Secondary | ICD-10-CM | POA: Diagnosis not present

## 2023-05-26 DIAGNOSIS — Z7902 Long term (current) use of antithrombotics/antiplatelets: Secondary | ICD-10-CM

## 2023-05-26 DIAGNOSIS — D62 Acute posthemorrhagic anemia: Secondary | ICD-10-CM | POA: Diagnosis not present

## 2023-05-26 DIAGNOSIS — Z8719 Personal history of other diseases of the digestive system: Secondary | ICD-10-CM | POA: Diagnosis not present

## 2023-05-26 DIAGNOSIS — K5521 Angiodysplasia of colon with hemorrhage: Secondary | ICD-10-CM | POA: Diagnosis not present

## 2023-05-26 LAB — BASIC METABOLIC PANEL
Anion gap: 10 (ref 5–15)
BUN: 24 mg/dL — ABNORMAL HIGH (ref 8–23)
CO2: 26 mmol/L (ref 22–32)
Calcium: 9.1 mg/dL (ref 8.9–10.3)
Chloride: 102 mmol/L (ref 98–111)
Creatinine, Ser: 2.04 mg/dL — ABNORMAL HIGH (ref 0.44–1.00)
GFR, Estimated: 24 mL/min — ABNORMAL LOW (ref >60)
Glucose, Bld: 91 mg/dL (ref 70–99)
Potassium: 4.5 mmol/L (ref 3.5–5.1)
Sodium: 138 mmol/L (ref 135–145)

## 2023-05-26 LAB — CBC WITH DIFFERENTIAL/PLATELET
Abs Immature Granulocytes: 0 10*3/uL (ref 0.00–0.07)
Basophils Absolute: 0 10*3/uL (ref 0.0–0.1)
Basophils Relative: 1 %
Eosinophils Absolute: 0.3 10*3/uL (ref 0.0–0.5)
Eosinophils Relative: 7 %
HCT: 28.9 % — ABNORMAL LOW (ref 36.0–46.0)
Hemoglobin: 9.1 g/dL — ABNORMAL LOW (ref 12.0–15.0)
Immature Granulocytes: 0 %
Lymphocytes Relative: 36 %
Lymphs Abs: 1.9 10*3/uL (ref 0.7–4.0)
MCH: 28.8 pg (ref 26.0–34.0)
MCHC: 31.5 g/dL (ref 30.0–36.0)
MCV: 91.5 fL (ref 80.0–100.0)
Monocytes Absolute: 0.7 10*3/uL (ref 0.1–1.0)
Monocytes Relative: 13 %
Neutro Abs: 2.2 10*3/uL (ref 1.7–7.7)
Neutrophils Relative %: 43 %
Platelets: 252 10*3/uL (ref 150–400)
RBC: 3.16 MIL/uL — ABNORMAL LOW (ref 3.87–5.11)
RDW: 14.1 % (ref 11.5–15.5)
WBC: 5.1 10*3/uL (ref 4.0–10.5)
nRBC: 0 % (ref 0.0–0.2)

## 2023-05-26 LAB — MAGNESIUM: Magnesium: 2.1 mg/dL (ref 1.7–2.4)

## 2023-05-26 MED ORDER — BOOST / RESOURCE BREEZE PO LIQD CUSTOM
1.0000 | Freq: Three times a day (TID) | ORAL | Status: DC
  Administered 2023-05-26 (×2): 1 via ORAL

## 2023-05-26 MED ORDER — BOOST / RESOURCE BREEZE PO LIQD CUSTOM: 1.0000 | Freq: Three times a day (TID) | ORAL | Status: DC

## 2023-05-26 NOTE — Progress Notes (Signed)
[facility] Progress Note (Weekend coverage for Dr. Elnoria Howard) Chief Complaint: GI bleeding  History:  Dr. Charlton Amor consult note from yesterday reviewed, additional chart review performed. Patient reports no passage of black tarry stool for at least the last 24 hours.  Denies abdominal pain chest pain or dyspnea or dysuria  Last Plavix dose of 37.5 mg was 2 days ago  Objective:   Current Facility-Administered Medications:    0.9 %  sodium chloride infusion (Manually program via Guardrails IV Fluids), , Intravenous, Once, Long, Arlyss Repress, MD   acetaminophen (TYLENOL) tablet 650 mg, 650 mg, Oral, Q4H PRN, Lewie Chamber, MD, 650 mg at 05/25/23 1902   albuterol (PROVENTIL) (2.5 MG/3ML) 0.083% nebulizer solution 2.5 mg, 2.5 mg, Nebulization, Q2H PRN, Kirby Crigler, Mir M, MD   amiodarone (PACERONE) tablet 100 mg, 100 mg, Oral, Daily, Kirby Crigler, Mir M, MD, 100 mg at 05/26/23 0931   atorvastatin (LIPITOR) tablet 40 mg, 40 mg, Oral, QHS, Kirby Crigler, Mir M, MD, 40 mg at 05/25/23 2114   fluticasone furoate-vilanterol (BREO ELLIPTA) 200-25 MCG/ACT 1 puff, 1 puff, Inhalation, Daily, 1 puff at 05/26/23 0845 **AND** umeclidinium bromide (INCRUSE ELLIPTA) 62.5 MCG/ACT 1 puff, 1 puff, Inhalation, Daily, Kirby Crigler, Mir M, MD, 1 puff at 05/26/23 0845   irbesartan (AVAPRO) tablet 75 mg, 75 mg, Oral, Daily, Kirby Crigler, Mir M, MD, 75 mg at 05/26/23 0931   loratadine (CLARITIN) tablet 10 mg, 10 mg, Oral, Daily PRN, Kirby Crigler, Mir M, MD   ondansetron (ZOFRAN) tablet 4 mg, 4 mg, Oral, Q6H PRN **OR** ondansetron (ZOFRAN) injection 4 mg, 4 mg, Intravenous, Q6H PRN, Kirby Crigler, Mir M, MD   pantoprazole (PROTONIX) injection 40 mg, 40 mg, Intravenous, Q12H, Kirby Crigler, Mir M, MD, 40 mg at 05/26/23 0931   traZODone (DESYREL) tablet 25 mg, 25 mg, Oral, QHS PRN, Kirby Crigler, Mir M, MD     Vital signs in last 24 hrs: Vitals:   05/26/23 0544 05/26/23 0846  BP: (!) 135/52   Pulse: (!) 44   Resp: 18   Temp: 98 F (36.7 C)    SpO2: 96% 97%   No intake or output data in the 24 hours ending 05/26/23 1053   Physical Exam Pale.  Looks comfortable, alert conversational Cardiac: RRR without murmurs, S1S2 heard, no peripheral edema Pulm: clear to auscultation bilaterally, normal RR and effort noted Abdomen: soft, no tenderness, with active bowel sounds. No guarding or palpable hepatosplenomegaly  Recent Labs:     Latest Ref Rng & Units 05/26/2023    5:57 AM 05/25/2023    5:54 AM 05/24/2023    1:25 PM  CBC  WBC 4.0 - 10.5 K/uL 5.1  6.2  6.4   Hemoglobin 12.0 - 15.0 g/dL 9.1  8.6  8.0   Hematocrit 36.0 - 46.0 % 28.9  27.0  25.9   Platelets 150 - 400 K/uL 252  248  292     Recent Labs  Lab 05/24/23 1325  INR 1.1      Latest Ref Rng & Units 05/26/2023    5:57 AM 05/25/2023    5:54 AM 05/24/2023    1:25 PM  CMP  Glucose 70 - 99 mg/dL 91  89  88   BUN 8 - 23 mg/dL 24  27  29    Creatinine 0.44 - 1.00 mg/dL 9.60  4.54  0.98   Sodium 135 - 145 mmol/L 138  138  137   Potassium 3.5 - 5.1 mmol/L 4.5  4.4  4.2   Chloride 98 - 111 mmol/L 102  104  105   CO2 22 - 32 mmol/L 26  25  24    Calcium 8.9 - 10.3 mg/dL 9.1  8.8  8.8   Total Protein 6.5 - 8.1 g/dL   7.2   Total Bilirubin 0.3 - 1.2 mg/dL   0.5   Alkaline Phos 38 - 126 U/L   62   AST 15 - 41 U/L   17   ALT 0 - 44 U/L   11      Radiologic studies:   Assessment & Plan  Assessment: Recurrent melena Acute on chronic blood loss anemia Known small bowel AVMs with previous bleeding and endoscopic management.   Clinically stable, bleeding seems to have at least significantly slow down if not stopped already off antiplatelet agent for 48 hours.  I do not know Dr. Haywood Pao availability for or willingness to proceed with an enteroscopy as early as tomorrow if only 3 days off antiplatelet agent, but I will keep her n.p.o. after midnight just to give the option of that and then Dr.Hung can decide in the morning. I made her aware that plan and she is  agreeable.   Charlie Pitter III Office: [phone]

## 2023-05-26 NOTE — Plan of Care (Signed)

## 2023-05-26 NOTE — Progress Notes (Signed)
Progress Note    [name]   ZOX:[id number]  DOB: 1941-05-27  DOA: 05/24/2023     0 PCP: Tracey Harries, MD  Initial CC: Decreased hemoglobin, dark stools  Hospital Course: [name] is an 82 yo female with PMH CAD s/p CABG, PVD, small bowel angiodysplasias with hx GIB, TIA, GERD, COPD, CKD4, anemia, diverticulosis, glaucoma, HTN, HLD. She presented with worsening weakness, melanotic stools.  She had outpatient blood work checked and was found to have lower hemoglobin than baseline and was referred to the ER for further workup.  Interval History:  No events overnight. Hgb stable now  Assessment and Plan: * Melena - followed by Dr. Elnoria Howard outpatient; known history of jejunal AVMs requiring treatment with APC in 2023 - now presents with melena and weakness; Hgb down to 8 g/dL on admission.  Baseline roughly around 9 to 10 g/dL - GI consulted on admission, appreciate assistance; tentative plan for trending Hgb for now and possible VCE next week - continue protonix; holding plavix for now (last dose morning of 10/11) - overall Hgb has stabilized and is improving; continue trending  History of intestinal angiodysplasia - followed by Dr. Elnoria Howard outpatient; known history of jejunal AVMs requiring treatment with APC in 2023 -see melena workup  Chronic diastolic CHF (congestive heart failure) (HCC) - no s/s exac - hold lasix for now - continue ARB and statin  History of transient ischemic attack (TIA) - hold plavix during GIB workup  CKD (chronic kidney disease) stage 4, GFR 15-29 ml/min (HCC) - patient has history of CKD4. Baseline creat ~ 1.8, eGFR~ 28   Peripheral vascular disease (HCC) - continue statin -Plavix on hold  CAD (coronary artery disease) - Continue Lipitor, ARB - Plavix on hold  HLD (hyperlipidemia) - Continue Lipitor  Essential hypertension - Continue ARB  GERD (gastroesophageal reflux disease) - Continue Protonix   Old records reviewed in  assessment of this patient  Antimicrobials:   DVT prophylaxis:  SCDs Start: 05/24/23 1514   Code Status:   Code Status: Limited: Do not attempt resuscitation (DNR) -DNR-LIMITED -Do Not Intubate/DNI   Mobility Assessment (Last 72 Hours)     Mobility Assessment     Row Name 05/26/23 0720 05/25/23 2125 05/25/23 2115 05/25/23 0835 05/24/23 2150   Does patient have an order for bedrest or is patient medically unstable No - Continue assessment No - Continue assessment No - Continue assessment No - Continue assessment No - Continue assessment   What is the highest level of mobility based on the progressive mobility assessment? Level 5 (Walks with assist in room/hall) - Balance while stepping forward/back and can walk in room with assist - Complete Level 5 (Walks with assist in room/hall) - Balance while stepping forward/back and can walk in room with assist - Complete Level 5 (Walks with assist in room/hall) - Balance while stepping forward/back and can walk in room with assist - Complete Level 5 (Walks with assist in room/hall) - Balance while stepping forward/back and can walk in room with assist - Complete Level 5 (Walks with assist in room/hall) - Balance while stepping forward/back and can walk in room with assist - Complete    Row Name 05/24/23 1700           Does patient have an order for bedrest or is patient medically unstable No - Continue assessment       What is the highest level of mobility based on the progressive mobility assessment? Level 5 (Walks with assist  in room/hall) - Balance while stepping forward/back and can walk in room with assist - Complete                Barriers to discharge: None Disposition Plan: Home Status is: Obs needs inpt  Objective: Blood pressure (!) 135/52, pulse (!) 44, temperature 98 F (36.7 C), temperature source Oral, resp. rate 18, height 5\' 2"  (1.575 m), weight 65.8 kg, SpO2 97%.  Examination:  Physical Exam Constitutional:       Appearance: Normal appearance.  HENT:     Head: Normocephalic and atraumatic.     Mouth/Throat:     Mouth: Mucous membranes are moist.  Eyes:     Extraocular Movements: Extraocular movements intact.  Cardiovascular:     Rate and Rhythm: Normal rate and regular rhythm.  Pulmonary:     Effort: Pulmonary effort is normal. No respiratory distress.     Breath sounds: Normal breath sounds. No wheezing.  Abdominal:     General: Bowel sounds are normal. There is no distension.     Palpations: Abdomen is soft.     Tenderness: There is no abdominal tenderness.  Musculoskeletal:        General: Normal range of motion.     Cervical back: Normal range of motion and neck supple.  Skin:    General: Skin is warm and dry.  Neurological:     General: No focal deficit present.     Mental Status: She is alert.  Psychiatric:        Mood and Affect: Mood normal.      Consultants:  GI  Procedures:    Data Reviewed: Results for orders placed or performed during the hospital encounter of 05/24/23 (from the past 24 hour(s))  Basic metabolic panel     Status: Abnormal   Collection Time: 05/26/23  5:57 AM  Result Value Ref Range   Sodium 138 135 - 145 mmol/L   Potassium 4.5 3.5 - 5.1 mmol/L   Chloride 102 98 - 111 mmol/L   CO2 26 22 - 32 mmol/L   Glucose, Bld 91 70 - 99 mg/dL   BUN 24 (H) 8 - 23 mg/dL   Creatinine, Ser 0.34 (H) 0.44 - 1.00 mg/dL   Calcium 9.1 8.9 - 74.2 mg/dL   GFR, Estimated 24 (L) >60 mL/min   Anion gap 10 5 - 15  CBC with Differential/Platelet     Status: Abnormal   Collection Time: 05/26/23  5:57 AM  Result Value Ref Range   WBC 5.1 4.0 - 10.5 K/uL   RBC 3.16 (L) 3.87 - 5.11 MIL/uL   Hemoglobin 9.1 (L) 12.0 - 15.0 g/dL   HCT 59.5 (L) 63.8 - 75.6 %   MCV 91.5 80.0 - 100.0 fL   MCH 28.8 26.0 - 34.0 pg   MCHC 31.5 30.0 - 36.0 g/dL   RDW 43.3 29.5 - 18.8 %   Platelets 252 150 - 400 K/uL   nRBC 0.0 0.0 - 0.2 %   Neutrophils Relative % 43 %   Neutro Abs 2.2 1.7 -  7.7 K/uL   Lymphocytes Relative 36 %   Lymphs Abs 1.9 0.7 - 4.0 K/uL   Monocytes Relative 13 %   Monocytes Absolute 0.7 0.1 - 1.0 K/uL   Eosinophils Relative 7 %   Eosinophils Absolute 0.3 0.0 - 0.5 K/uL   Basophils Relative 1 %   Basophils Absolute 0.0 0.0 - 0.1 K/uL   Immature Granulocytes 0 %   Abs Immature Granulocytes  0.00 0.00 - 0.07 K/uL  Magnesium     Status: None   Collection Time: 05/26/23  5:57 AM  Result Value Ref Range   Magnesium 2.1 1.7 - 2.4 mg/dL    I have reviewed pertinent nursing notes, vitals, labs, and images as necessary. I have ordered labwork to follow up on as indicated.  I have reviewed the last notes from staff over past 24 hours. I have discussed patient's care plan and test results with nursing staff, CM/SW, and other staff as appropriate.    LOS: 0 days   Lewie Chamber, MD [facility] 05/26/2023, 12:19 PM

## 2023-05-27 ENCOUNTER — Ambulatory Visit (HOSPITAL_COMMUNITY): Payer: 59

## 2023-05-27 DIAGNOSIS — K921 Melena: Secondary | ICD-10-CM | POA: Diagnosis not present

## 2023-05-27 DIAGNOSIS — Z8719 Personal history of other diseases of the digestive system: Secondary | ICD-10-CM | POA: Diagnosis not present

## 2023-05-27 LAB — TYPE AND SCREEN
ABO/RH(D): A POS
Antibody Screen: NEGATIVE
Unit division: 0

## 2023-05-27 LAB — BASIC METABOLIC PANEL
Anion gap: 9 (ref 5–15)
BUN: 26 mg/dL — ABNORMAL HIGH (ref 8–23)
CO2: 25 mmol/L (ref 22–32)
Calcium: 8.9 mg/dL (ref 8.9–10.3)
Chloride: 104 mmol/L (ref 98–111)
Creatinine, Ser: 2 mg/dL — ABNORMAL HIGH (ref 0.44–1.00)
GFR, Estimated: 25 mL/min — ABNORMAL LOW (ref >60)
Glucose, Bld: 99 mg/dL (ref 70–99)
Potassium: 4.5 mmol/L (ref 3.5–5.1)
Sodium: 138 mmol/L (ref 135–145)

## 2023-05-27 LAB — CBC WITH DIFFERENTIAL/PLATELET
Abs Immature Granulocytes: 0.02 10*3/uL (ref 0.00–0.07)
Basophils Absolute: 0 10*3/uL (ref 0.0–0.1)
Basophils Relative: 1 %
Eosinophils Absolute: 0.4 10*3/uL (ref 0.0–0.5)
Eosinophils Relative: 6 %
HCT: 30.1 % — ABNORMAL LOW (ref 36.0–46.0)
Hemoglobin: 9.3 g/dL — ABNORMAL LOW (ref 12.0–15.0)
Immature Granulocytes: 0 %
Lymphocytes Relative: 32 %
Lymphs Abs: 2 10*3/uL (ref 0.7–4.0)
MCH: 28.4 pg (ref 26.0–34.0)
MCHC: 30.9 g/dL (ref 30.0–36.0)
MCV: 92 fL (ref 80.0–100.0)
Monocytes Absolute: 0.7 10*3/uL (ref 0.1–1.0)
Monocytes Relative: 12 %
Neutro Abs: 3.1 10*3/uL (ref 1.7–7.7)
Neutrophils Relative %: 49 %
Platelets: 262 10*3/uL (ref 150–400)
RBC: 3.27 MIL/uL — ABNORMAL LOW (ref 3.87–5.11)
RDW: 13.8 % (ref 11.5–15.5)
WBC: 6.3 10*3/uL (ref 4.0–10.5)
nRBC: 0 % (ref 0.0–0.2)

## 2023-05-27 LAB — BPAM RBC
Blood Product Expiration Date: 202410172359
ISSUE DATE / TIME: 202410111832
Unit Type and Rh: 6200

## 2023-05-27 LAB — MAGNESIUM: Magnesium: 1.9 mg/dL (ref 1.7–2.4)

## 2023-05-27 MED ORDER — CLOPIDOGREL BISULFATE 75 MG PO TABS: 37.5000 mg | ORAL_TABLET | Freq: Every morning | ORAL | Status: AC

## 2023-05-27 NOTE — Discharge Summary (Signed)
Physician Discharge Summary   [name]:[id number] DOB: [birth year] DOA: 05/24/2023  PCP: Tracey Harries, MD  Admit date: 05/24/2023 Discharge date: 05/27/2023   Admitted From: Home Disposition:  Home Discharging physician: Lewie Chamber, MD Barriers to discharge: none  Recommendations at discharge: Follow up with GI Check Hgb  Discharge Condition: stable CODE STATUS: Full Diet recommendation:  Diet Orders (From admission, onward)     Start     Ordered   05/27/23 1236  Diet regular Room service appropriate? Yes; Fluid consistency: Thin  Diet effective now       Question Answer Comment  Room service appropriate? Yes   Fluid consistency: Thin      05/27/23 1235   05/27/23 0000  Diet general        05/27/23 1306            Hospital Course: [name] is an 82 yo female with PMH CAD s/p CABG, PVD, small bowel angiodysplasias with hx GIB, TIA, GERD, COPD, CKD4, anemia, diverticulosis, glaucoma, HTN, HLD. She presented with worsening weakness, melanotic stools.  She had outpatient blood work checked and was found to have lower hemoglobin than baseline and was referred to the ER for further workup. She received 1 unit PRBC transfusion on 05/24/2023.  Hemoglobin responded and remained stable after transfusion.  Stools also transitioned back to brown in color. She was amenable with discharging and following up with GI outpatient.  Since hemoglobin remained stable and stools have transitioned back to normal color and consistency, she was resumed back on Plavix at time of discharge.  Assessment and Plan: * Melena-resolved as of 05/27/2023 - followed by Dr. Elnoria Howard outpatient; known history of jejunal AVMs requiring treatment with APC in 2023 - presented with melena and weakness; Hgb down to 8 g/dL on admission.  Baseline roughly around 9 to 10 g/dL -Plavix held during hospitalization - Hemoglobin stabilized after blood transfusion and stools transitioned back to brown -  resumed plavix at discharge and she plans to followup with GI outpatient  History of intestinal angiodysplasia - followed by Dr. Elnoria Howard outpatient; known history of jejunal AVMs requiring treatment with APC in 2023 -see melena workup  Chronic diastolic CHF (congestive heart failure) (HCC) - no s/s exac - resume lasix at discharge  - continue ARB and statin  History of transient ischemic attack (TIA) - held plavix during hospitalization.  Resumed at discharge  CKD (chronic kidney disease) stage 4, GFR 15-29 ml/min (HCC) - patient has history of CKD4. Baseline creat ~ 1.8, eGFR~ 28   Peripheral vascular disease (HCC) - continue statin -Plavix resumed at discharge  CAD (coronary artery disease) - Continue Lipitor, ARB, Plavix  HLD (hyperlipidemia) - Continue Lipitor  Essential hypertension - Continue ARB  GERD (gastroesophageal reflux disease) - Continue Protonix   The patient's acute and chronic medical conditions were treated accordingly. On day of discharge, patient was felt deemed stable for discharge. Patient/family member advised to call PCP or come back to ER if needed.   Principal Diagnosis: Melena  Discharge Diagnoses: Active Hospital Problems   Diagnosis Date Noted   History of intestinal angiodysplasia 05/24/2023    Priority: 2.   Chronic diastolic CHF (congestive heart failure) (HCC) 11/18/2021    Priority: 3.   History of transient ischemic attack (TIA) 12/03/2021    Priority: 4.   CKD (chronic kidney disease) stage 4, GFR 15-29 ml/min (HCC) 05/25/2023    Priority: 5.   Peripheral vascular disease (HCC) 01/07/2013    Priority:  5.   CAD (coronary artery disease) 04/02/2013    Priority: 6.   HLD (hyperlipidemia) 08/02/2014    Priority: 7.   Essential hypertension 08/02/2014    Priority: 8.   GERD (gastroesophageal reflux disease) 08/02/2014    Priority: 9.    Resolved Hospital Problems   Diagnosis Date Noted Date Resolved   Melena 12/02/2021  05/27/2023    Priority: 1.     Discharge Instructions     Diet general   Complete by: As directed    Increase activity slowly   Complete by: As directed       Allergies as of 05/27/2023       Reactions   Avelox [moxifloxacin Hcl In Nacl] Palpitations, Other (See Comments)   Caused Heart Attack    Azithromycin Swelling, Other (See Comments)   Patient reported past history of lip swelling   Codeine Other (See Comments)   Dr. Sharyn Lull advised patient not to take this medication   Doxycycline Swelling, Other (See Comments)   Mouth, lips, feet swelling   Hydromorphone Palpitations, Other (See Comments)   DILAUDID  -  Pt had a Heart Attack after taking Dilaudid.   Levaquin [levofloxacin] Shortness Of Breath, Other (See Comments)   Chest pressure, SOB, "pain in between shoulder blades", sweaty -as reported by patient per experience in ED this afternoon   Vitamin D Analogs Swelling, Other (See Comments)   Face and lips swell, but no breathing issues   Nifedipine Er Other (See Comments)   Dropped the heart rate too low   Octreotide    Chest pain, N/V   Zetia [ezetimibe] Other (See Comments)   "Made me feel like my face was going to sleep/numb"   Oxycodone-acetaminophen Other (See Comments)   Says it makes her "feel weird"   Risedronate Other (See Comments)   Chest pain        Medication List     STOP taking these medications    lidocaine 5 % Commonly known as: Lidoderm   pantoprazole 40 MG tablet Commonly known as: PROTONIX       TAKE these medications    acetaminophen 500 MG tablet Commonly known as: TYLENOL Take 500-1,000 mg by mouth every 6 (six) hours as needed (for pain).   albuterol 108 (90 Base) MCG/ACT inhaler Commonly known as: VENTOLIN HFA Inhale 2 puffs into the lungs every 6 (six) hours as needed for wheezing or shortness of breath.   amiodarone 200 MG tablet Commonly known as: PACERONE Take 100 mg by mouth daily.   atorvastatin 40 MG  tablet Commonly known as: LIPITOR Take 1 tablet (40 mg total) by mouth at bedtime.   cetirizine 10 MG tablet Commonly known as: ZYRTEC Take 10 mg by mouth daily as needed for allergies.   clopidogrel 75 MG tablet Commonly known as: PLAVIX Take 0.5 tablets (37.5 mg total) by mouth in the morning.   dexlansoprazole 60 MG capsule Commonly known as: DEXILANT Take 60 mg by mouth daily before breakfast.   fluorouracil 5 % cream Commonly known as: EFUDEX Apply 1 application  topically See admin instructions. Apply to affected areas of the face once a day   furosemide 40 MG tablet Commonly known as: LASIX Take 40 mg by mouth in the morning.   magnesium oxide 400 (240 Mg) MG tablet Commonly known as: MAG-OX Take 400 mg by mouth daily after breakfast.   nitroGLYCERIN 0.4 MG SL tablet Commonly known as: NITROSTAT Place 1 tablet (0.4 mg total) under the  tongue every 5 (five) minutes x 3 doses as needed for chest pain.   Nurtec 75 MG Tbdp Generic drug: Rimegepant Sulfate Take 1 tablet (75 mg total) by mouth as needed.   olmesartan 20 MG tablet Commonly known as: Benicar Take 1 tablet (20 mg total) by mouth daily. What changed:  how much to take when to take this   Trelegy Ellipta 200-62.5-25 MCG/ACT Aepb Generic drug: Fluticasone-Umeclidin-Vilant USE 1 INHALATION BY MOUTH DAILY What changed: See the new instructions.        Follow-up Information     Jeani Hawking, MD. Schedule an appointment as soon as possible for a visit in 1 week(s).   Specialty: Gastroenterology Contact information: [address] [place] [address] [phone]                Allergies  Allergen Reactions   Avelox [Moxifloxacin Hcl In Nacl] Palpitations and Other (See Comments)    Caused Heart Attack    Azithromycin Swelling and Other (See Comments)    Patient reported past history of lip swelling   Codeine Other (See Comments)    Dr. Sharyn Lull advised patient not to  take this medication   Doxycycline Swelling and Other (See Comments)    Mouth, lips, feet swelling   Hydromorphone Palpitations and Other (See Comments)    DILAUDID  -  Pt had a Heart Attack after taking Dilaudid.   Levaquin [Levofloxacin] Shortness Of Breath and Other (See Comments)    Chest pressure, SOB, "pain in between shoulder blades", sweaty -as reported by patient per experience in ED this afternoon   Vitamin D Analogs Swelling and Other (See Comments)    Face and lips swell, but no breathing issues   Nifedipine Er Other (See Comments)    Dropped the heart rate too low   Octreotide     Chest pain, N/V   Zetia [Ezetimibe] Other (See Comments)    "Made me feel like my face was going to sleep/numb"   Oxycodone-Acetaminophen Other (See Comments)    Says it makes her "feel weird"   Risedronate Other (See Comments)    Chest pain    Consultations: GI  Procedures:   Discharge Exam: BP (!) 143/51 (BP Location: Left Arm)   Pulse (!) 47   Temp 97.9 F (36.6 C) (Oral)   Resp 20   Ht 5\' 2"  (1.575 m)   Wt 65.8 kg   SpO2 99%   BMI 26.52 kg/m  Physical Exam Constitutional:      Appearance: Normal appearance.  HENT:     Head: Normocephalic and atraumatic.     Mouth/Throat:     Mouth: Mucous membranes are moist.  Eyes:     Extraocular Movements: Extraocular movements intact.  Cardiovascular:     Rate and Rhythm: Normal rate and regular rhythm.  Pulmonary:     Effort: Pulmonary effort is normal. No respiratory distress.     Breath sounds: Normal breath sounds. No wheezing.  Abdominal:     General: Bowel sounds are normal. There is no distension.     Palpations: Abdomen is soft.     Tenderness: There is no abdominal tenderness.  Musculoskeletal:        General: Normal range of motion.     Cervical back: Normal range of motion and neck supple.  Skin:    General: Skin is warm and dry.  Neurological:     General: No focal deficit present.     Mental Status: She is  alert.  Psychiatric:        Mood and Affect: Mood normal.      The results of significant diagnostics from this hospitalization (including imaging, microbiology, ancillary and laboratory) are listed below for reference.   Microbiology: No results found for this or any previous visit (from the past 240 hour(s)).   Labs: BNP (last 3 results) Recent Labs    07/16/22 1212 09/10/22 0825  BNP 45.3 68.0   Basic Metabolic Panel: Recent Labs  Lab 05/23/23 1510 05/24/23 1325 05/25/23 0554 05/26/23 0557 05/27/23 0530  NA 140 137 138 138 138  K 4.7 4.2 4.4 4.5 4.5  CL 105 105 104 102 104  CO2 27 24 25 26 25   GLUCOSE 97 88 89 91 99  BUN 28* 29* 27* 24* 26*  CREATININE 1.96* 1.78* 1.97* 2.04* 2.00*  CALCIUM 9.7 8.8* 8.8* 9.1 8.9  MG  --   --   --  2.1 1.9   Liver Function Tests: Recent Labs  Lab 05/23/23 1510 05/24/23 1325  AST 14* 17  ALT 7 11  ALKPHOS 71 62  BILITOT 0.3 0.5  PROT 7.4 7.2  ALBUMIN 4.2 3.7   No results for input(s): "LIPASE", "AMYLASE" in the last 168 hours. No results for input(s): "AMMONIA" in the last 168 hours. CBC: Recent Labs  Lab 05/23/23 1510 05/24/23 1325 05/25/23 0554 05/26/23 0557 05/27/23 0530  WBC 8.1 6.4 6.2 5.1 6.3  NEUTROABS 4.4  --   --  2.2 3.1  HGB 8.5* 8.0* 8.6* 9.1* 9.3*  HCT 26.9* 25.9* 27.0* 28.9* 30.1*  MCV 92.4 92.8 91.2 91.5 92.0  PLT 316 292 248 252 262   Cardiac Enzymes: No results for input(s): "CKTOTAL", "CKMB", "CKMBINDEX", "TROPONINI" in the last 168 hours. BNP: Invalid input(s): "POCBNP" CBG: No results for input(s): "GLUCAP" in the last 168 hours. D-Dimer No results for input(s): "DDIMER" in the last 72 hours. Hgb A1c No results for input(s): "HGBA1C" in the last 72 hours. Lipid Profile No results for input(s): "CHOL", "HDL", "LDLCALC", "TRIG", "CHOLHDL", "LDLDIRECT" in the last 72 hours. Thyroid function studies No results for input(s): "TSH", "T4TOTAL", "T3FREE", "THYROIDAB" in the last 72  hours.  Invalid input(s): "FREET3" Anemia work up No results for input(s): "VITAMINB12", "FOLATE", "FERRITIN", "TIBC", "IRON", "RETICCTPCT" in the last 72 hours. Urinalysis    Component Value Date/Time   COLORURINE YELLOW 04/07/2022 1642   APPEARANCEUR HAZY (A) 04/07/2022 1642   LABSPEC 1.016 04/07/2022 1642   PHURINE 5.0 04/07/2022 1642   GLUCOSEU NEGATIVE 04/07/2022 1642   HGBUR NEGATIVE 04/07/2022 1642   BILIRUBINUR NEGATIVE 04/07/2022 1642   KETONESUR NEGATIVE 04/07/2022 1642   PROTEINUR NEGATIVE 04/07/2022 1642   UROBILINOGEN 0.2 04/02/2013 0232   NITRITE NEGATIVE 04/07/2022 1642   LEUKOCYTESUR LARGE (A) 04/07/2022 1642   Sepsis Labs Recent Labs  Lab 05/24/23 1325 05/25/23 0554 05/26/23 0557 05/27/23 0530  WBC 6.4 6.2 5.1 6.3   Microbiology No results found for this or any previous visit (from the past 240 hour(s)).  Procedures/Studies: CT HEAD WO CONTRAST  Result Date: 05/08/2023 CLINICAL DATA:  Headache with facial numbness EXAM: CT HEAD WITHOUT CONTRAST TECHNIQUE: Contiguous axial images were obtained from the base of the skull through the vertex without intravenous contrast. RADIATION DOSE REDUCTION: This exam was performed according to the departmental dose-optimization program which includes automated exposure control, adjustment of the mA and/or kV according to patient size and/or use of iterative reconstruction technique. COMPARISON:  Head CT 04/03/2023 FINDINGS: Brain: No evidence of  acute infarction, hemorrhage, hydrocephalus, extra-axial collection or mass lesion/mass effect. Mild generalized cerebral volume loss Vascular: No hyperdense vessel or unexpected calcification. Skull: Unremarkable suboccipital craniectomy.  No acute finding Sinuses/Orbits: Mild sinus opacification similar to prior and seen at the ethmoid and right inferior frontal sinuses. Symmetric mastoid sclerosis without opacification. IMPRESSION: 1.  No acute or interval finding. 2. Chronic  frontoethmoidal sinus opacification. Electronically Signed   By: Tiburcio Pea M.D.   On: 05/08/2023 12:34     Time coordinating discharge: Over 30 minutes    Lewie Chamber, MD  [facility] 05/27/2023, 3:33 PM

## 2023-05-27 NOTE — Progress Notes (Signed)
Mobility Specialist - Progress Note   05/27/23 1041  Mobility  Activity Ambulated with assistance in hallway  Level of Assistance Standby assist, set-up cues, supervision of patient - no hands on  Assistive Device Front wheel walker  Distance Ambulated (ft) 460 ft  Activity Response Tolerated well  Mobility Referral Yes  $Mobility charge 1 Mobility  Mobility Specialist Start Time (ACUTE ONLY) 1033  Mobility Specialist Stop Time (ACUTE ONLY) 1040  Mobility Specialist Time Calculation (min) (ACUTE ONLY) 7 min   Pt received in bed and agreeable to mobility. No complaints during session. Pt to bed after session with all needs met.    [facility]

## 2023-05-27 NOTE — Progress Notes (Signed)
No changes from am assessment. Patient is alert, oriented x4 and ambulatory. Discharge instructions reviewed, questions, concerns denied at this time.

## 2023-05-28 ENCOUNTER — Telehealth: Payer: Self-pay

## 2023-05-28 ENCOUNTER — Inpatient Hospital Stay (HOSPITAL_BASED_OUTPATIENT_CLINIC_OR_DEPARTMENT_OTHER): Payer: 59 | Admitting: Physician Assistant

## 2023-05-28 VITALS — BP 169/50 | HR 50 | Temp 97.5°F | Resp 19 | Ht 62.0 in | Wt 145.0 lb

## 2023-05-28 DIAGNOSIS — C3431 Malignant neoplasm of lower lobe, right bronchus or lung: Secondary | ICD-10-CM | POA: Diagnosis not present

## 2023-05-28 DIAGNOSIS — C3490 Malignant neoplasm of unspecified part of unspecified bronchus or lung: Secondary | ICD-10-CM | POA: Diagnosis not present

## 2023-05-28 DIAGNOSIS — D509 Iron deficiency anemia, unspecified: Secondary | ICD-10-CM

## 2023-05-28 NOTE — Telephone Encounter (Addendum)
Auth Submission: NO AUTH NEEDED Site of care: Site of care: [facility] Payer: [employer] medicare Medication & CPT/J Code(s) submitted: Venofer (Iron Sucrose) J1756 Route of submission (phone, fax, portal): portal Phone # Fax # Auth type: Buy/Bill PB Units/visits requested: 300mg  x 3 doses Reference number: [id number] Approval from: 05/28/23 to 08/13/23

## 2023-05-29 ENCOUNTER — Ambulatory Visit: Payer: Medicare Other | Admitting: Internal Medicine

## 2023-05-29 ENCOUNTER — Other Ambulatory Visit: Payer: Medicare Other

## 2023-05-30 ENCOUNTER — Other Ambulatory Visit: Payer: 59

## 2023-06-03 ENCOUNTER — Ambulatory Visit: Payer: 59 | Admitting: Internal Medicine

## 2023-06-05 ENCOUNTER — Ambulatory Visit: Payer: 59 | Admitting: *Deleted

## 2023-06-05 VITALS — BP 171/70 | HR 54 | Temp 97.8°F | Resp 16 | Ht 62.0 in | Wt 148.4 lb

## 2023-06-05 DIAGNOSIS — D509 Iron deficiency anemia, unspecified: Secondary | ICD-10-CM

## 2023-06-05 DIAGNOSIS — D500 Iron deficiency anemia secondary to blood loss (chronic): Secondary | ICD-10-CM

## 2023-06-05 MED ORDER — IRON SUCROSE 300 MG IVPB - SIMPLE MED
300.0000 mg | Freq: Once | Status: AC
  Administered 2023-06-05: 300 mg via INTRAVENOUS

## 2023-06-05 NOTE — Progress Notes (Signed)
Diagnosis: Iron Deficiency Anemia  Provider:  Chilton Greathouse MD  Procedure: IV Infusion  IV Type: Peripheral, IV Location: R Antecubital  Venofer (Iron Sucrose), Dose: 300 mg  Infusion Start Time: 1252 pm  Infusion Stop Time: 1435 pm  Post Infusion IV Care: Observation period completed and Peripheral IV Discontinued  Discharge: Condition: Good, Destination: Home . AVS Declined  Performed by:  Forrest Moron, RN

## 2023-06-12 ENCOUNTER — Ambulatory Visit (INDEPENDENT_AMBULATORY_CARE_PROVIDER_SITE_OTHER): Payer: 59

## 2023-06-12 VITALS — BP 185/66 | HR 54 | Temp 97.9°F | Resp 20 | Ht 62.0 in | Wt 148.0 lb

## 2023-06-12 DIAGNOSIS — D509 Iron deficiency anemia, unspecified: Secondary | ICD-10-CM

## 2023-06-12 DIAGNOSIS — D500 Iron deficiency anemia secondary to blood loss (chronic): Secondary | ICD-10-CM

## 2023-06-12 MED ORDER — IRON SUCROSE 300 MG IVPB - SIMPLE MED
300.0000 mg | Freq: Once | Status: AC
  Administered 2023-06-12: 300 mg via INTRAVENOUS
  Filled 2023-06-12: qty 265

## 2023-06-12 MED ORDER — DIPHENHYDRAMINE HCL 25 MG PO CAPS
25.0000 mg | ORAL_CAPSULE | Freq: Once | ORAL | Status: DC
Start: 2023-06-12 — End: 2023-06-12

## 2023-06-12 NOTE — Progress Notes (Signed)
Diagnosis: Iron Deficiency Anemia  Provider:  Chilton Greathouse MD  Procedure: IV Infusion  IV Type: Peripheral, IV Location: L Antecubital  Venofer (Iron Sucrose), Dose: 300 mg  Infusion Start Time: 1306  Infusion Stop Time: 1442  Post Infusion IV Care: Patient declined observation and Peripheral IV Discontinued  Discharge: Condition: Good, Destination: Home . AVS Declined  Performed by:  Wyvonne Lenz, RN

## 2023-06-13 ENCOUNTER — Ambulatory Visit: Payer: 59 | Admitting: Neurology

## 2023-06-19 ENCOUNTER — Ambulatory Visit: Payer: 59

## 2023-06-19 VITALS — BP 202/73 | HR 53 | Temp 98.7°F | Resp 18 | Ht 62.0 in | Wt 148.2 lb

## 2023-06-19 DIAGNOSIS — D509 Iron deficiency anemia, unspecified: Secondary | ICD-10-CM

## 2023-06-19 DIAGNOSIS — D500 Iron deficiency anemia secondary to blood loss (chronic): Secondary | ICD-10-CM

## 2023-06-19 MED ORDER — IRON SUCROSE 300 MG IVPB - SIMPLE MED
300.0000 mg | Freq: Once | Status: AC
  Administered 2023-06-19: 300 mg via INTRAVENOUS
  Filled 2023-06-19: qty 265

## 2023-06-19 MED ORDER — DIPHENHYDRAMINE HCL 25 MG PO CAPS: 25.0000 mg | ORAL_CAPSULE | Freq: Once | ORAL | Status: DC

## 2023-06-19 NOTE — Progress Notes (Signed)
Diagnosis: Iron Deficiency Anemia  Provider:  Chilton Greathouse MD  Procedure: IV Infusion  IV Type: Peripheral, IV Location: L Antecubital  Venofer (Iron Sucrose), Dose: 300 mg  Infusion Start Time: 0913  Infusion Stop Time: 1053  Post Infusion IV Care: Patient declined observation and Peripheral IV Discontinued  Discharge: Condition: Good, Destination: Home . AVS Declined  Performed by:  Garnette Czech, RN

## 2023-06-28 ENCOUNTER — Ambulatory Visit: Payer: 59 | Admitting: Pulmonary Disease

## 2023-07-17 ENCOUNTER — Other Ambulatory Visit: Payer: Self-pay

## 2023-07-22 ENCOUNTER — Ambulatory Visit: Payer: 59 | Admitting: Pulmonary Disease

## 2023-07-22 ENCOUNTER — Encounter: Payer: Self-pay | Admitting: Pulmonary Disease

## 2023-07-22 VITALS — BP 112/60 | HR 44 | Ht 62.0 in | Wt 143.6 lb

## 2023-07-22 DIAGNOSIS — C3431 Malignant neoplasm of lower lobe, right bronchus or lung: Secondary | ICD-10-CM

## 2023-07-22 DIAGNOSIS — J449 Chronic obstructive pulmonary disease, unspecified: Secondary | ICD-10-CM | POA: Diagnosis not present

## 2023-07-22 DIAGNOSIS — Z85118 Personal history of other malignant neoplasm of bronchus and lung: Secondary | ICD-10-CM

## 2023-07-22 DIAGNOSIS — Z923 Personal history of irradiation: Secondary | ICD-10-CM

## 2023-07-22 DIAGNOSIS — Z951 Presence of aortocoronary bypass graft: Secondary | ICD-10-CM

## 2023-07-22 NOTE — Patient Instructions (Addendum)
Thank you for visiting Dr. Tonia Brooms at [facility]. Today we recommend the following:  Stay on trelegy   Return in about 6 months (around 01/20/2024).    Please do your part to reduce the spread of COVID-19.

## 2023-07-22 NOTE — Progress Notes (Signed)
Synopsis: Referred in January 2020 to establish care, former patient of Dr. Kriste Basque   Subjective:   PATIENT ID: [name] GENDER: female DOB: [birth year], MRN: [id number]  Chief Complaint  Patient presents with   Follow-up    Pt states she has pneumonia, currently on antibiotics. Pt states she feels like she still has pneumonia.      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  She is followed with Dr. Shirline Frees and Dr. Kathrynn Running.  In the oncology services.  She has an appointment with him tomorrow.  She had a recent CT scan completed yesterday which reveals right lower lobe groundglass opacities and scarring after stereotactic radiation to the right lower lobe.  She also has a small 7 mm central nodular location that will need to be followed.  She had PFTs completed today prior to her office visit.  The pulmonary function test revealed moderate COPD with an FEV1 65% postbronchodilator reduced ratio of 66, significant bronchodilator response with greater than 12% improvement, as well as a reduced DLCO 55% predicted.  Patient states today that she quit using her Anora inhaler several months ago because she did not know if it was making much difference in her breathing symptoms.  Before that she liked being on it.  There was some confusion regarding stopping blood pressure medication Azor versus stopping her Anoro.  She may have misunderstood that that information regarding changing of medications.  However she does have a stock supply of an oral at home that she could restart.  She did think it made a little bit of difference but was not sure.  At baseline she is dyspneic on exertion but she does not have any significant cough or daily sputum production.  OV 02/19/2019: recently in the hospital for TIA symptoms. No stroke. Now on plavix, taken off ASA. Followed planned with Dr. Arbutus Ped from  oncology on 15th. Doing well since hospitalization. Concerned about her o2 levels. They were low a few times in there hospital. Quit smoking in 2001.   OV 09/09/2019: Patient seen today for follow-up regarding her COPD.  Her symptoms are stable.  She denies chest pain nausea vomiting diarrhea.  She does have some shortness of breath with exertion.  She lives at home.  She is able to complete her activities of daily living.  She does have questions today regarding the Covid vaccine.  Patient was counseled on Covid vaccination and encouraged to obtain this next available opportunity.  OV 12/23/2019: Here today for follow-up regarding COPD.  Patient recently seen by Dr. Everardo All for exacerbation and shortness of breath increasing shortness of breath.  She had inhaler change.  At this time doing well with the inhaler but still has some shortness of breath with significant exertion.  She wanted know if there is a different medication that may go to change to.  Denies weight loss denies hemoptysis.  She has follow-up scheduled with Dr. Shirline Frees in July with a repeat CT scan.  OV 05/25/2020: Patient here today for follow-up regarding her COPD.  Overall doing well today.  Currently managed with her Trelegy inhaler.  She recently had palpitations and saw her cardiologist.  She has a Holter monitor in place attached to the left chest.  She has had a few fluttering events.  She does have dyspnea on exertion which has been stable for her.  She does need  her flu shot today.  She has had both of her COVID-19 vaccinations.  She denies fevers chills night sweats weight loss cough and sputum production.  OV 05/22/2021: Here today for 1 year follow-up.  Patient has no significant respiratory complaints today.  She feels short of breath when she exerts herself which is about her baseline she is currently managed with Trelegy and likes using this inhaler.  She would like to stay on it.  She had a recent CT scan of the chest in  July which showed interval enlargement of a right upper lobe basilar nodule.  The lower lobe lesion appeared stable.  We reviewed her CT scan today in the office.  OV 08/25/2021: Here today for follow-up after recent CT scan of the chest.  Looking at a lung nodule in the anterior portion of the right upper lobe with associated groundglass calcification.  This appears stable.  From respiratory standpoint doing well using Trelegy.  She does feel short of breath with exertion but she feels like it is around her baseline.  OV 03/07/2022: Here today for follow-up after recent CT scan of the chest.  Review recent CT in July shows mild interval increase in the right lower lobe lesion that she had had radiation before.  She has a subsolid lesion in the left lower lobe that is new compared to previous imaging.  From respiratory standpoint she doing well COPD maintenance on Trelegy.  As needed albuterol she recently had a surgery on the right hand for carpal tunnel and is doing well post surgery.  OV 08/22/2022: recently went to the ED. from respiratory standpoint she is back to her baseline.  She is breathing okay using her inhaler regimen.  She has follow-up imaging that was complete by Dr. Shirline Frees back in October that shows stability and no evidence of recurrence of disease.  She has been working with cardiology as she has had these ongoing episodes of flush this with cold sweats that have been released and improved with nitro.  She had a recent nuclear stress test that was negative.  No cardiac catheterization due to her kidney disease.  OV 07/22/2023:Here today for follow-up.  Last seen in the office in July 2024.  Seen by Augustine Radar, NP.  Reviewed her pulmonary function test that was completed prior to that had moderate obstructive disease with no significant reversibility.  Moderate reduction in diffusion defect.     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HCC)    Arnold-Chiari malformation (HCC) 1998   Asthma    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   Blood in stool    last week of aug 2018   CAP (community acquired pneumonia) 10/22/2015   Chronic kidney disease    COPD (chronic obstructive pulmonary disease) (HCC)    Coronary artery disease    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   Diverticulitis    Dyspnea    with exertion   Gastroesophageal reflux disease    occ   Glaucoma    right eye   Headache syndrome 11/27/2018   Hiatal hernia    History of shingles 06/23/2018   Hyperlipidemia    Hypertension    Lung cancer (HCC) dx 2018   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   Myocardial infarction (HCC) 01/01/2000   Cardiac catheterization   Peripheral vascular disease (HCC)    stents in legs x 2 or 3  Pneumonia    last time winter 2017 -2018   PONV (postoperative nausea and vomiting)    occassionally, last colonscopy did ok with anesthesia   Primary cancer of right lower lobe of lung (HCC) 04/25/2016   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   TIA (transient ischemic attack) 02/11/2019   Wears dentures    Full set   Wears glasses      Family History  Problem Relation Age of Onset   Heart disease Mother        Heart Disease before age 4   Hypertension Mother    Hyperlipidemia Mother    Heart attack Mother    Clotting disorder Mother    Heart disease Father        Heart Disease before age 30   Heart attack Father    Hyperlipidemia Father    Hypertension Father    Heart disease Brother        Heart Disease before age 30   Hyperlipidemia Brother    Hypertension Brother    Clotting disorder Brother    AAA (abdominal aortic aneurysm) Brother    Cerebral aneurysm Sister    Hypertension Sister    AAA (abdominal aortic aneurysm) Sister    Asthma Sister    Cerebral aneurysm Brother    Cancer Brother        Lung   Hypertension Brother    Heart attack Brother     Heart disease Brother        Aneurysm of Brain   Hypertension Brother    Heart disease Brother    Heart disease Brother    Stroke Son        Aneurysm of Stomach   AAA (abdominal aortic aneurysm) Son    Cancer Maternal Uncle        great uncle/cancer/type unknown     Past Surgical History:  Procedure Laterality Date   ABDOMINAL HYSTERECTOMY     APPENDECTOMY     Arnold-chiari malformation repair  1998   Suboccipital craniectomy   CAROTID ENDARTERECTOMY  03/29/2010   Left  CEA   CHOLECYSTECTOMY     Gall Bladder   COLONOSCOPY WITH PROPOFOL N/A 04/22/2015   Procedure: COLONOSCOPY WITH PROPOFOL;  Surgeon: Jeani Hawking, MD;  Location: [facility] ENDOSCOPY;  Service: Endoscopy;  Laterality: N/A;   COLONOSCOPY WITH PROPOFOL N/A 05/25/2016   Procedure: COLONOSCOPY WITH PROPOFOL;  Surgeon: Jeani Hawking, MD;  Location: [facility];  Service: Endoscopy;  Laterality: N/A;   COLONOSCOPY WITH PROPOFOL N/A 05/03/2017   Procedure: COLONOSCOPY WITH PROPOFOL;  Surgeon: Jeani Hawking, MD;  Location: [facility];  Service: Endoscopy;  Laterality: N/A;   COLONOSCOPY WITH PROPOFOL N/A 06/29/2020   Procedure: COLONOSCOPY WITH PROPOFOL;  Surgeon: Jeani Hawking, MD;  Location: [facility];  Service: Endoscopy;  Laterality: N/A;   COLONOSCOPY WITH PROPOFOL N/A 01/05/2021   Procedure: COLONOSCOPY WITH PROPOFOL;  Surgeon: Jeani Hawking, MD;  Location: [facility];  Service: Endoscopy;  Laterality: N/A;   COLONOSCOPY WITH PROPOFOL N/A 09/28/2021   Procedure: COLONOSCOPY WITH PROPOFOL;  Surgeon: Jeani Hawking, MD;  Location: [facility] ENDOSCOPY;  Service: Endoscopy;  Laterality: N/A;   CORNEAL TRANSPLANT     Right   CORONARY ARTERY BYPASS GRAFT  01/01/2000   x 3   ENDARTERECTOMY Right 09/26/2021   Procedure: RIGHT CAROTID ENDARTERECTOMY;  Surgeon: Chuck Hint, MD;  Location: [facility] OR;  Service: Vascular;  Laterality: Right;   ENTEROSCOPY N/A 02/27/2018   Procedure: ENTEROSCOPY;  Surgeon: Jeani Hawking, MD;  Location:  [facility];  Service: Endoscopy;  Laterality: N/A;   ENTEROSCOPY N/A 07/18/2018   Procedure: ENTEROSCOPY;  Surgeon: Jeani Hawking, MD;  Location: [facility];  Service: Endoscopy;  Laterality: N/A;   ENTEROSCOPY N/A 06/29/2020   Procedure: ENTEROSCOPY;  Surgeon: Jeani Hawking, MD;  Location: [facility];  Service: Endoscopy;  Laterality: N/A;   ENTEROSCOPY N/A 12/05/2021   Procedure: ENTEROSCOPY;  Surgeon: Jeani Hawking, MD;  Location: [facility];  Service: Gastroenterology;  Laterality: N/A;   ENTEROSCOPY N/A 12/25/2021   Procedure: ENTEROSCOPY;  Surgeon: Jeani Hawking, MD;  Location: [facility];  Service: Gastroenterology;  Laterality: N/A;  with endoscopic placement of video capsule   ESOPHAGOGASTRODUODENOSCOPY N/A 05/25/2016   Procedure: ESOPHAGOGASTRODUODENOSCOPY (EGD);  Surgeon: Jeani Hawking, MD;  Location: [facility] ENDOSCOPY;  Service: Endoscopy;  Laterality: N/A;   ESOPHAGOGASTRODUODENOSCOPY N/A 08/01/2018   Procedure: ESOPHAGOGASTRODUODENOSCOPY (EGD);  Surgeon: Jeani Hawking, MD;  Location: [facility] ENDOSCOPY;  Service: Endoscopy;  Laterality: N/A;   ESOPHAGOGASTRODUODENOSCOPY (EGD) WITH PROPOFOL N/A 09/28/2021   Procedure: ESOPHAGOGASTRODUODENOSCOPY (EGD) WITH PROPOFOL;  Surgeon: Jeani Hawking, MD;  Location: [facility] ENDOSCOPY;  Service: Endoscopy;  Laterality: N/A;   EYE SURGERY Right 1995 or 1996   Laser surgery for retinal hemorrhage   GIVENS CAPSULE STUDY N/A 07/16/2018   Procedure: GIVENS CAPSULE STUDY;  Surgeon: Jeani Hawking, MD;  Location: [facility];  Service: Endoscopy;  Laterality: N/A;   GIVENS CAPSULE STUDY N/A 12/14/2021   Procedure: GIVENS CAPSULE STUDY;  Surgeon: Jeani Hawking, MD;  Location: [facility] ENDOSCOPY;  Service: Gastroenterology;  Laterality: N/A;   GIVENS CAPSULE STUDY N/A 12/25/2021   Procedure: GIVENS CAPSULE STUDY;  Surgeon: Jeani Hawking, MD;  Location: [facility] ENDOS[facility];  Service: Gastroenterology;  Laterality: N/A;   HEMOSTASIS CLIP PLACEMENT  06/29/2020   Procedure:  HEMOSTASIS CLIP PLACEMENT;  Surgeon: Jeani Hawking, MD;  Location: [facility];  Service: Endoscopy;;   HEMOSTASIS CLIP PLACEMENT  01/05/2021   Procedure: HEMOSTASIS CLIP PLACEMENT;  Surgeon: Jeani Hawking, MD;  Location: [facility] ENDOS[facility];  Service: Endoscopy;;   HOT HEMOSTASIS N/A 02/27/2018   Procedure: HOT HEMOSTASIS (ARGON PLASMA COAGULATION/BICAP);  Surgeon: Jeani Hawking, MD;  Location: [facility] ENDOSCOPY;  Service: Endoscopy;  Laterality: N/A;   HOT HEMOSTASIS N/A 08/01/2018   Procedure: HOT HEMOSTASIS (ARGON PLASMA COAGULATION/BICAP);  Surgeon: Jeani Hawking, MD;  Location: [facility] ENDOSCOPY;  Service: Endoscopy;  Laterality: N/A;   HOT HEMOSTASIS N/A 06/29/2020   Procedure: HOT HEMOSTASIS (ARGON PLASMA COAGULATION/BICAP);  Surgeon: Jeani Hawking, MD;  Location: [facility] ENDOSCOPY;  Service: Endoscopy;  Laterality: N/A;   HOT HEMOSTASIS N/A 01/05/2021   Procedure: HOT HEMOSTASIS (ARGON PLASMA COAGULATION/BICAP);  Surgeon: Jeani Hawking, MD;  Location: [facility] ENDOSCOPY;  Service: Endoscopy;  Laterality: N/A;   HOT HEMOSTASIS N/A 12/05/2021   Procedure: HOT HEMOSTASIS (ARGON PLASMA COAGULATION/BICAP);  Surgeon: Jeani Hawking, MD;  Location: [facility] ENDOSCOPY;  Service: Gastroenterology;  Laterality: N/A;   IR RADIOLOGIST EVAL & MGMT  12/14/2016   IR RADIOLOGIST EVAL & MGMT  07/26/2021   LEFT HEART CATH AND CORS/GRAFTS ANGIOGRAPHY N/A 01/09/2018   Procedure: LEFT HEART CATH AND CORS/GRAFTS ANGIOGRAPHY;  Surgeon: Rinaldo Cloud, MD;  Location: [facility] INVASIVE CV LAB;  Service: Cardiovascular;  Laterality: N/A;   LEFT HEART CATHETERIZATION WITH CORONARY ANGIOGRAM N/A 08/03/2014   Procedure: LEFT HEART CATHETERIZATION WITH CORONARY ANGIOGRAM;  Surgeon: Ricki Rodriguez, MD;  Location: [facility] CATH LAB;  Service: Cardiovascular;  Laterality: N/A;   PATCH ANGIOPLASTY Right 09/26/2021   Procedure: PATCH ANGIOPLASTY RIGHT CAROTID;  Surgeon: Chuck Hint, MD;  Location: [facility] OR;  Service: Vascular;  Laterality: Right;   Post Coronary  Artery  BPG  01/05/2000   Right jugular sheath removed   PR VEIN BYPASS GRAFT,AORTO-FEM-POP     ROTATOR CUFF REPAIR     Right    Social History   Socioeconomic History   Marital status: Widowed    Spouse name: Not on file   Number of children: 4   Years of education: 7TH   Highest education level: Not on file  Occupational History   Not on file  Tobacco Use   Smoking status: Former    Current packs/day: 0.00    Average packs/day: 1.5 packs/day for 30.0 years (45.0 ttl pk-yrs)    Types: Cigarettes    Start date: 08/13/1970    Quit date: 08/13/2000    Years since quitting: 22.9    Passive exposure: Never   Smokeless tobacco: Never  Vaping Use   Vaping status: Never Used  Substance and Sexual Activity   Alcohol use: No    Alcohol/week: 0.0 standard drinks of alcohol   Drug use: No   Sexual activity: Never  Other Topics Concern   Not on file  Social History Narrative   Lives at home w/ her son   Right-handed   Drinks coffee and [employer]eps[employer] daily   Social Determinants of Health   Financial Resource Strain: Low Risk  (03/02/2023)   Received from [facility]   Overall Financial Resource Strain (CARDIA)    Difficulty of Paying Living Expenses: Not hard at all  Food Insecurity: No Food Insecurity (05/24/2023)   Hunger Vital Sign    Worried About Running Out of Food in the Last Year: Never true    Ran Out of Food in the Last Year: Never true  Transportation Needs: No Transportation Needs (05/24/2023)   PRAPARE - Administrator, Civil Service (Medical): No    Lack of Transportation (Non-Medical): No  Physical Activity: Unknown (03/02/2023)   [facility]   Exercise Vital Sign    Days of Exercise per Week: 0 days    Minutes of Exercise per Session: Not on file  Stress: Stress Concern Present (03/02/2023)   Received from [facility] - Occupational Stress Questionnaire    Feeling of Stress : To some extent   Social Connections: Somewhat Isolated (09/09/2022)   Received from [facility]   Social Network    How would you rate your social network (family, work, friends)?: Restricted participation with some degree of social isolation  Intimate Partner Violence: Not At Risk (05/24/2023)   Humiliation, Afraid, Rape, and Kick questionnaire    Fear of Current or Ex-Partner: No    Emotionally Abused: No    Physically Abused: No    Sexually Abused: No     Allergies  Allergen Reactions   Avelox [Moxifloxacin Hcl In Nacl] Palpitations and Other (See Comments)    Caused Heart Attack    Azithromycin Swelling and Other (See Comments)    Patient reported past history of lip swelling   Codeine Other (See Comments)    Dr. Sharyn Lull advised patient not to take this medication   Doxycycline Swelling and Other (See Comments)    Mouth, lips, feet swelling   Hydromorphone Palpitations and Other (See Comments)    DILAUDID  -  Pt had a Heart Attack after taking Dilaudid.   Levaquin [Levofloxacin] Shortness Of Breath and Other (See Comments)    Chest pressure, SOB, "pain  in between shoulder blades", sweaty -as reported by patient per experience in ED this afternoon   Vitamin D Analogs Swelling and Other (See Comments)    Face and lips swell, but no breathing issues   Nifedipine Er Other (See Comments)    Dropped the heart rate too low   Octreotide     Chest pain, N/V   Zetia [Ezetimibe] Other (See Comments)    "Made me feel like my face was going to sleep/numb"   Oxycodone-Acetaminophen Other (See Comments)    Says it makes her "feel weird"   Risedronate Other (See Comments)    Chest pain     Outpatient Medications Prior to Visit  Medication Sig Dispense Refill   acetaminophen (TYLENOL) 500 MG tablet Take 500-1,000 mg by mouth every 6 (six) hours as needed (for pain).     albuterol (VENTOLIN HFA) 108 (90 Base) MCG/ACT inhaler Inhale 2 puffs into the lungs every 6 (six) hours as needed  for wheezing or shortness of breath. 8 g 6   amiodarone (PACERONE) 200 MG tablet Take 100 mg by mouth daily.     atorvastatin (LIPITOR) 40 MG tablet Take 1 tablet (40 mg total) by mouth at bedtime. 30 tablet 6   cetirizine (ZYRTEC) 10 MG tablet Take 10 mg by mouth daily as needed for allergies.      clopidogrel (PLAVIX) 75 MG tablet Take 0.5 tablets (37.5 mg total) by mouth in the morning.     dexlansoprazole (DEXILANT) 60 MG capsule Take 60 mg by mouth daily before breakfast.     fluorouracil (EFUDEX) 5 % cream Apply 1 application  topically See admin instructions. Apply to affected areas of the face once a day     furosemide (LASIX) 40 MG tablet Take 40 mg by mouth in the morning.     magnesium oxide (MAG-OX) 400 (240 Mg) MG tablet Take 400 mg by mouth daily after breakfast.     nitroGLYCERIN (NITROSTAT) 0.4 MG SL tablet Place 1 tablet (0.4 mg total) under the tongue every 5 (five) minutes x 3 doses as needed for chest pain. 10 tablet 0   olmesartan (BENICAR) 20 MG tablet Take 1 tablet (20 mg total) by mouth daily. (Patient taking differently: Take 10 mg by mouth in the morning.) 90 tablet 1   Rimegepant Sulfate (NURTEC) 75 MG TBDP Take 1 tablet (75 mg total) by mouth as needed. 8 tablet 6   TRELEGY ELLIPTA 200-62.5-25 MCG/ACT AEPB USE 1 INHALATION BY MOUTH DAILY (Patient taking differently: Inhale 1 puff into the lungs daily.) 180 each 3   No facility-administered medications prior to visit.    Review of Systems  Constitutional:  Negative for chills, fever, malaise/fatigue and weight loss.  HENT:  Negative for hearing loss, sore throat and tinnitus.   Eyes:  Negative for blurred vision and double vision.  Respiratory:  Positive for shortness of breath. Negative for cough, hemoptysis, sputum production, wheezing and stridor.   Cardiovascular:  Negative for chest pain, palpitations, orthopnea, leg swelling and PND.  Gastrointestinal:  Negative for abdominal pain, constipation, diarrhea,  heartburn, nausea and vomiting.  Genitourinary:  Negative for dysuria, hematuria and urgency.  Musculoskeletal:  Negative for joint pain and myalgias.  Skin:  Negative for itching and rash.  Neurological:  Negative for dizziness, tingling, weakness and headaches.  Endo/Heme/Allergies:  Negative for environmental allergies. Does not bruise/bleed easily.  Psychiatric/Behavioral:  Negative for depression. The patient is not nervous/anxious and does not have insomnia.   All other  systems reviewed and are negative.    Objective:  Physical Exam Vitals reviewed.  Constitutional:      General: She is not in acute distress.    Appearance: She is well-developed.  HENT:     Head: Normocephalic and atraumatic.  Eyes:     General: No scleral icterus.    Conjunctiva/sclera: Conjunctivae normal.     Pupils: Pupils are equal, round, and reactive to light.  Neck:     Vascular: No JVD.     Trachea: No tracheal deviation.  Cardiovascular:     Rate and Rhythm: Normal rate and regular rhythm.     Heart sounds: Normal heart sounds. No murmur heard. Pulmonary:     Effort: Pulmonary effort is normal. No tachypnea, accessory muscle usage or respiratory distress.     Breath sounds: No stridor. No wheezing, rhonchi or rales.     Comments: Diminished breath sounds BL  Abdominal:     General: There is no distension.     Palpations: Abdomen is soft.     Tenderness: There is no abdominal tenderness.  Musculoskeletal:        General: No tenderness.     Cervical back: Neck supple.  Lymphadenopathy:     Cervical: No cervical adenopathy.  Skin:    General: Skin is warm and dry.     Capillary Refill: Capillary refill takes less than 2 seconds.     Findings: No rash.  Neurological:     Mental Status: She is alert and oriented to person, place, and time.  Psychiatric:        Behavior: Behavior normal.     Vitals:   07/22/23 0910  BP: 112/60  Pulse: (!) 44  SpO2: 98%  Weight: 143 lb 9.6 oz (65.1  kg)  Height: 5\' 2"  (1.575 m)    98% on RA BMI Readings from Last 3 Encounters:  07/22/23 26.26 kg/m  06/19/23 27.11 kg/m  06/12/23 27.07 kg/m   Wt Readings from Last 3 Encounters:  07/22/23 143 lb 9.6 oz (65.1 kg)  06/19/23 148 lb 3.2 oz (67.2 kg)  06/12/23 148 lb (67.1 kg)   Chest Imaging:  08/25/2018 CT Chest:  Imaging reviewed.  Right lower lobe areas of groundglass and scarring status post sterotactic radiation likely radiation-induced parenchymal scarring.  Small nodular density within this area 7 mm in size.  She also has a new associated/adjacent rib fracture within that right side which may be concerning and would need to be followed for any recurrence of squamous cell disease. The patient's images have been independently reviewed by me.    02/23/2019 CT Chest: Follow up scheduled for cancer eval and staging   Chest x-ray 11/10/2019: Chronic scarring in the bases.  Mildly hyperinflated lung fields consistent with COPD diagnosis. The patient's images have been independently reviewed by me.    02/27/2021: CT scan of the chest Irregular 1.4 cm sup solid anterior basilar right upper lobe pulmonary nodule.  Slowly enlarging over time. Also has masslike fibrosis in the right lower lobe. The patient's images have been independently reviewed by me.    08/22/2021 CT chest: Nodule within the right lower lobe stable post treatments.  Right upper lobe anterior lung nodule stable in appearance The patient's images have been independently reviewed by me.    July 2023: CT chest: Mild interval enlargement in the right lower lobe lesion that had previous radiation.  Has a new subsolid lesion in the left lower lobe. The patient's images have been independently  reviewed by me.   October 2023 CT chest: No evidence of recurrence of disease stable treated right lower lobe nodule with radiation changes Chronic fracture of the right seventh rib. The patient's images have been  independently reviewed by me.     Pulmonary Functions Testing Results:    Latest Ref Rng & Units 03/08/2023    2:27 PM 08/26/2018   12:59 PM 02/14/2018   11:18 AM 04/10/2016    9:11 AM  PFT Results  FVC-Pre L 1.84  1.69  1.80  P 1.53   FVC-Predicted Pre % 77  65  69  P 57   FVC-Post L 1.96  1.91  1.99  P 1.82   FVC-Predicted Post % 82  74  77  P 69   Pre FEV1/FVC % % 65  67  72  P 71   Post FEV1/FCV % % 67  66  72  P 70   FEV1-Pre L 1.20  1.12  1.30  P 1.09   FEV1-Predicted Pre % 68  58  67  P 54   FEV1-Post L 1.31  1.26  1.43  P 1.28   DLCO uncorrected ml/min/mmHg 11.14  11.93   11.07   DLCO UNC% % 63  55   51   DLCO corrected ml/min/mmHg 11.14  13.35     DLCO COR %Predicted % 63  61     DLVA Predicted % 91  97   86   TLC L 4.10  3.85   3.77   TLC % Predicted % 86  81   79   RV % Predicted % 98  94   96     P Preliminary result     FeNO: None   Pathology:   Squamous cell carcinoma of the lung right lower lobe status post stereotactic radiation therapy  Echocardiogram: none recent   Heart Catheterization:   Jan 09, 2018: Dr. Lowry Bowl LAD to Prox LAD lesion is 80% stenosed. Ost 1st Diag lesion is 100% stenosed. Prox RCA lesion is 20% stenosed. Mid RCA lesion is 30% stenosed. Mid LM to Dist LM lesion is 40% stenosed. Ost Ramus lesion is 40% stenosed. Ost Cx lesion is 40% stenosed. Origin lesion is 100% stenosed. Origin to Prox Graft lesion is 100% stenosed. The left ventricular systolic function is normal. LV end diastolic pressure is normal. The left ventricular ejection fraction is 50-55% by visual estimate.    Assessment & Plan:     ICD-10-CM   1. COPD GOLD II  J44.9     2. 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 Stage 2 moderate COPD by GOLD classification (HCC)  J44.9     4. Hx of radiation therapy  Z92.3     5. Hx of cancer of lung  Z85.118     6. S/P CABG x 3  Z95.1       Assessment:   82 year old female seen for  stage II COPD follow-up she has a history of a right lower lobe cancer status post radiation, history of CABG she has a new left lower lobe subsolid lesion that we have been following.  Repeat CT imaging stable.  From a respiratory standpoint she is doing well tolerating her triple therapy inhaler regimen.  Plan: Continue surveillance CT imaging with Dr. Shirline Frees medical oncology COPD management with Trelegy and as needed albuterol.  Follow-up with [place] in 6 months or as needed.  Current Outpatient Medications:    acetaminophen (TYLENOL) 500 MG tablet, Take 500-1,000 mg by mouth every 6 (six) hours as needed (for pain)., Disp: , Rfl:    albuterol (VENTOLIN HFA) 108 (90 Base) MCG/ACT inhaler, Inhale 2 puffs into the lungs every 6 (six) hours as needed for wheezing or shortness of breath., Disp: 8 g, Rfl: 6   amiodarone (PACERONE) 200 MG tablet, Take 100 mg by mouth daily., Disp: , Rfl:    atorvastatin (LIPITOR) 40 MG tablet, Take 1 tablet (40 mg total) by mouth at bedtime., Disp: 30 tablet, Rfl: 6   cetirizine (ZYRTEC) 10 MG tablet, Take 10 mg by mouth daily as needed for allergies. , Disp: , Rfl:    clopidogrel (PLAVIX) 75 MG tablet, Take 0.5 tablets (37.5 mg total) by mouth in the morning., Disp: , Rfl:    dexlansoprazole (DEXILANT) 60 MG capsule, Take 60 mg by mouth daily before breakfast., Disp: , Rfl:    fluorouracil (EFUDEX) 5 % cream, Apply 1 application  topically See admin instructions. Apply to affected areas of the face once a day, Disp: , Rfl:    furosemide (LASIX) 40 MG tablet, Take 40 mg by mouth in the morning., Disp: , Rfl:    magnesium oxide (MAG-OX) 400 (240 Mg) MG tablet, Take 400 mg by mouth daily after breakfast., Disp: , Rfl:    nitroGLYCERIN (NITROSTAT) 0.4 MG SL tablet, Place 1 tablet (0.4 mg total) under the tongue every 5 (five) minutes x 3 doses as needed for chest pain., Disp: 10 tablet, Rfl: 0   olmesartan (BENICAR) 20 MG tablet, Take 1 tablet (20 mg total) by  mouth daily. (Patient taking differently: Take 10 mg by mouth in the morning.), Disp: 90 tablet, Rfl: 1   Rimegepant Sulfate (NURTEC) 75 MG TBDP, Take 1 tablet (75 mg total) by mouth as needed., Disp: 8 tablet, Rfl: 6   TRELEGY ELLIPTA 200-62.5-25 MCG/ACT AEPB, USE 1 INHALATION BY MOUTH DAILY (Patient taking differently: Inhale 1 puff into the lungs daily.), Disp: 180 each, Rfl: 3    Josephine Igo, DO [facility] 07/22/2023 9:31 AM

## 2023-07-30 ENCOUNTER — Other Ambulatory Visit: Payer: Self-pay | Admitting: *Deleted

## 2023-07-30 DIAGNOSIS — Z9889 Other specified postprocedural states: Secondary | ICD-10-CM

## 2023-07-30 DIAGNOSIS — I6523 Occlusion and stenosis of bilateral carotid arteries: Secondary | ICD-10-CM

## 2023-07-30 DIAGNOSIS — I771 Stricture of artery: Secondary | ICD-10-CM

## 2023-08-01 ENCOUNTER — Other Ambulatory Visit (HOSPITAL_COMMUNITY): Payer: Self-pay | Admitting: Interventional Radiology

## 2023-08-01 ENCOUNTER — Telehealth (HOSPITAL_COMMUNITY): Payer: Self-pay

## 2023-08-01 DIAGNOSIS — R519 Headache, unspecified: Secondary | ICD-10-CM

## 2023-08-01 DIAGNOSIS — I671 Cerebral aneurysm, nonruptured: Secondary | ICD-10-CM

## 2023-08-01 NOTE — Telephone Encounter (Signed)
Pt called with complaints of very sharp pains on the right side of her head. Sent a message to our PA Toniann Fail) to advise. She recommends the pt come in to discuss symptoms. AB

## 2023-08-06 ENCOUNTER — Ambulatory Visit (HOSPITAL_COMMUNITY)
Admission: RE | Admit: 2023-08-06 | Discharge: 2023-08-06 | Disposition: A | Discharge status: Home / Self Care | Payer: 59 | Source: Ambulatory Visit | Attending: Interventional Radiology | Admitting: Interventional Radiology

## 2023-08-12 ENCOUNTER — Ambulatory Visit (INDEPENDENT_AMBULATORY_CARE_PROVIDER_SITE_OTHER)
Admission: RE | Admit: 2023-08-12 | Discharge: 2023-08-12 | Disposition: A | Discharge status: Home / Self Care | Payer: 59 | Source: Ambulatory Visit | Attending: Physician Assistant

## 2023-08-12 ENCOUNTER — Encounter: Payer: Self-pay | Admitting: Physician Assistant

## 2023-08-12 ENCOUNTER — Ambulatory Visit (HOSPITAL_COMMUNITY)
Admission: RE | Admit: 2023-08-12 | Discharge: 2023-08-12 | Disposition: A | Discharge status: Home / Self Care | Payer: 59 | Source: Ambulatory Visit | Attending: Physician Assistant | Admitting: Physician Assistant

## 2023-08-12 ENCOUNTER — Ambulatory Visit (INDEPENDENT_AMBULATORY_CARE_PROVIDER_SITE_OTHER): Payer: 59 | Admitting: Physician Assistant

## 2023-08-12 VITALS — BP 173/66 | HR 47 | Temp 97.5°F | Wt 142.0 lb

## 2023-08-12 DIAGNOSIS — Z9889 Other specified postprocedural states: Secondary | ICD-10-CM | POA: Diagnosis not present

## 2023-08-12 DIAGNOSIS — I771 Stricture of artery: Secondary | ICD-10-CM | POA: Insufficient documentation

## 2023-08-12 DIAGNOSIS — I6523 Occlusion and stenosis of bilateral carotid arteries: Secondary | ICD-10-CM | POA: Insufficient documentation

## 2023-08-12 NOTE — Progress Notes (Signed)
[facility] HISTORY AND PHYSICAL   History of Present Illness:  Patient is a 82 y.o. year old female who presents for evaluation of  carotid artery disease. s/p right CEA on 09/26/2021 by Dr. Edilia Bo for asymptomatic right carotid artery stenosis.  She has remote hx of left CEA 03/28/2010 also by Dr. Edilia Bo.  She denies stroke symptoms such as amaurosis, weakness or aphasia. She has B  common iliac artery stents.  She denies claudication, rest pain or non healing wounds.  She states her lifestyle is limited by SOB.  She has a history of lung CA and has small lesions that have returned after radiation treatments.    She is very pleasant and up beat.  She is medically managed on Lipitor and Plavix.       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   Diverticulitis    Dyspnea    with exertion   Gastroesophageal reflux disease    occ   Glaucoma    right eye   Headache syndrome 11/27/2018   Hiatal hernia    History of shingles 06/23/2018   Hyperlipidemia    Hypertension    Lung cancer (HCC) dx 2018   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   Myocardial infarction (HCC) 01/01/2000   Cardiac catheterization   Peripheral vascular disease (HCC)    stents in legs x 2 or 3   Pneumonia    last time winter 2017 -2018   PONV (postoperative nausea and vomiting)    occassionally, last colonscopy did ok with anesthesia   Primary cancer of right lower lobe of lung (HCC) 04/25/2016   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   TIA (transient  ischemic attack) 02/11/2019   Wears dentures    Full set   Wears glasses     Past Surgical History:  Procedure Laterality Date   ABDOMINAL HYSTERECTOMY     APPENDECTOMY     Arnold-chiari malformation repair  1998   Suboccipital craniectomy   CAROTID ENDARTERECTOMY  03/29/2010   Left  CEA   CHOLECYSTECTOMY     Gall Bladder   COLONOSCOPY WITH PROPOFOL N/A 04/22/2015   Procedure: COLONOSCOPY WITH PROPOFOL;  Surgeon: Jeani Hawking, MD;  Location: [facility];  Service: Endoscopy;  Laterality: N/A;   COLONOSCOPY WITH PROPOFOL N/A 05/25/2016   Procedure: COLONOSCOPY WITH PROPOFOL;  Surgeon: Jeani Hawking, MD;  Location: [facility];  Service: Endoscopy;  Laterality: N/A;   COLONOSCOPY WITH PROPOFOL N/A 05/03/2017   Procedure: COLONOSCOPY WITH PROPOFOL;  Surgeon: Jeani Hawking, MD;  Location: [facility];  Service: Endoscopy;  Laterality: N/A;   COLONOSCOPY WITH PROPOFOL N/A 06/29/2020   Procedure: COLONOSCOPY WITH PROPOFOL;  Surgeon: Jeani Hawking, MD;  Location: [facility];  Service: Endoscopy;  Laterality: N/A;   COLONOSCOPY WITH PROPOFOL N/A 01/05/2021   Procedure: COLONOSCOPY WITH PROPOFOL;  Surgeon: Jeani Hawking, MD;  Location: [facility];  Service: Endoscopy;  Laterality: N/A;   COLONOSCOPY WITH PROPOFOL N/A 09/28/2021   Procedure: COLONOSCOPY WITH PROPOFOL;  Surgeon: Elnoria Howard,  Luisa Hart, MD;  Location: [facility] ENDOSCOPY;  Service: Endoscopy;  Laterality: N/A;   CORNEAL TRANSPLANT     Right   CORONARY ARTERY BYPASS GRAFT  01/01/2000   x 3   ENDARTERECTOMY Right 09/26/2021   Procedure: RIGHT CAROTID ENDARTERECTOMY;  Surgeon: Chuck Hint, MD;  Location: [facility] OR;  Service: Vascular;  Laterality: Right;   ENTEROSCOPY N/A 02/27/2018   Procedure: ENTEROSCOPY;  Surgeon: Jeani Hawking, MD;  Location: [facility];  Service: Endoscopy;  Laterality: N/A;   ENTEROSCOPY N/A 07/18/2018   Procedure: ENTEROSCOPY;  Surgeon: Jeani Hawking, MD;  Location: [facility];  Service: Endoscopy;  Laterality:  N/A;   ENTEROSCOPY N/A 06/29/2020   Procedure: ENTEROSCOPY;  Surgeon: Jeani Hawking, MD;  Location: [facility];  Service: Endoscopy;  Laterality: N/A;   ENTEROSCOPY N/A 12/05/2021   Procedure: ENTEROSCOPY;  Surgeon: Jeani Hawking, MD;  Location: [facility];  Service: Gastroenterology;  Laterality: N/A;   ENTEROSCOPY N/A 12/25/2021   Procedure: ENTEROSCOPY;  Surgeon: Jeani Hawking, MD;  Location: [facility];  Service: Gastroenterology;  Laterality: N/A;  with endoscopic placement of video capsule   ESOPHAGOGASTRODUODENOSCOPY N/A 05/25/2016   Procedure: ESOPHAGOGASTRODUODENOSCOPY (EGD);  Surgeon: Jeani Hawking, MD;  Location: [facility] ENDOSCOPY;  Service: Endoscopy;  Laterality: N/A;   ESOPHAGOGASTRODUODENOSCOPY N/A 08/01/2018   Procedure: ESOPHAGOGASTRODUODENOSCOPY (EGD);  Surgeon: Jeani Hawking, MD;  Location: [facility] ENDOSCOPY;  Service: Endoscopy;  Laterality: N/A;   ESOPHAGOGASTRODUODENOSCOPY (EGD) WITH PROPOFOL N/A 09/28/2021   Procedure: ESOPHAGOGASTRODUODENOSCOPY (EGD) WITH PROPOFOL;  Surgeon: Jeani Hawking, MD;  Location: [facility] ENDOSCOPY;  Service: Endoscopy;  Laterality: N/A;   EYE SURGERY Right 1995 or 1996   Laser surgery for retinal hemorrhage   GIVENS CAPSULE STUDY N/A 07/16/2018   Procedure: GIVENS CAPSULE STUDY;  Surgeon: Jeani Hawking, MD;  Location: [facility];  Service: Endoscopy;  Laterality: N/A;   GIVENS CAPSULE STUDY N/A 12/14/2021   Procedure: GIVENS CAPSULE STUDY;  Surgeon: Jeani Hawking, MD;  Location: [facility] ENDOSCOPY;  Service: Gastroenterology;  Laterality: N/A;   GIVENS CAPSULE STUDY N/A 12/25/2021   Procedure: GIVENS CAPSULE STUDY;  Surgeon: Jeani Hawking, MD;  Location: [facility];  Service: Gastroenterology;  Laterality: N/A;   HEMOSTASIS CLIP PLACEMENT  06/29/2020   Procedure: HEMOSTASIS CLIP PLACEMENT;  Surgeon: Jeani Hawking, MD;  Location: [facility];  Service: Endoscopy;;   HEMOSTASIS CLIP PLACEMENT  01/05/2021   Procedure: HEMOSTASIS CLIP PLACEMENT;  Surgeon: Jeani Hawking, MD;  Location: [facility];  Service: Endoscopy;;   HOT HEMOSTASIS N/A 02/27/2018   Procedure: HOT HEMOSTASIS (ARGON PLASMA COAGULATION/BICAP);  Surgeon: Jeani Hawking, MD;  Location: [facility] ENDOSCOPY;  Service: Endoscopy;  Laterality: N/A;   HOT HEMOSTASIS N/A 08/01/2018   Procedure: HOT HEMOSTASIS (ARGON PLASMA COAGULATION/BICAP);  Surgeon: Jeani Hawking, MD;  Location: [facility] ENDOSCOPY;  Service: Endoscopy;  Laterality: N/A;   HOT HEMOSTASIS N/A 06/29/2020   Procedure: HOT HEMOSTASIS (ARGON PLASMA COAGULATION/BICAP);  Surgeon: Jeani Hawking, MD;  Location: [facility] ENDOSCOPY;  Service: Endoscopy;  Laterality: N/A;   HOT HEMOSTASIS N/A 01/05/2021   Procedure: HOT HEMOSTASIS (ARGON PLASMA COAGULATION/BICAP);  Surgeon: Jeani Hawking, MD;  Location: [facility] ENDOSCOPY;  Service: Endoscopy;  Laterality: N/A;   HOT HEMOSTASIS N/A 12/05/2021   Procedure: HOT HEMOSTASIS (ARGON PLASMA COAGULATION/BICAP);  Surgeon: Jeani Hawking, MD;  Location: [facility] END[facility]COPY;  Service: Gastroenterology;  Laterality: N/A;   IR RADIOLOGIST EVAL & MGMT  12/14/2016   IR RADIOLOGIST EVAL & MGMT  07/26/2021   LEFT HEART CATH AND CORS/GRAFTS ANGIOGRAPHY N/A 01/09/2018   Procedure: LEFT HEART CATH AND CORS/GRAFTS ANGIOGRAPHY;  Surgeon: Rinaldo Cloud, MD;  Location: [facility] INVASIVE CV LAB;  Service: Cardiovascular;  Laterality: N/A;   LEFT HEART CATHETERIZATION WITH CORONARY ANGIOGRAM N/A 08/03/2014   Procedure: LEFT HEART CATHETERIZATION WITH CORONARY ANGIOGRAM;  Surgeon: Ricki Rodriguez, MD;  Location: [facility] CATH LAB;  Service: Cardiovascular;  Laterality: N/A;   PATCH ANGIOPLASTY Right 09/26/2021   Procedure: PATCH ANGIOPLASTY RIGHT CAROTID;  Surgeon: Chuck Hint, MD;  Location: [facility] OR;  Service: Vascular;  Laterality: Right;   Post Coronary Artery  BPG  01/05/2000   Right jugular sheath removed   PR VEIN BYPASS GRAFT,AORTO-FEM-POP     ROTATOR CUFF REPAIR     Right    ROS:   General:  No weight loss, Fever, chills  HEENT: No recent  headaches, no nasal bleeding, no visual changes, no sore throat  Neurologic: No dizziness, blackouts, seizures. No recent symptoms of stroke or mini- stroke. No recent episodes of slurred speech, or temporary blindness.  Cardiac: No recent episodes of chest pain/pressure, no shortness of breath at rest.  No shortness of breath with exertion.  Denies history of atrial fibrillation or irregular heartbeat  Vascular: No history of rest pain in feet.  No history of claudication.  No history of non-healing ulcer, No history of DVT   Pulmonary: No home oxygen, no productive cough, no hemoptysis,  No asthma or wheezing  Musculoskeletal:  [ ]  Arthritis, [ ]  Low back pain,  [ ]  Joint pain  Hematologic:No history of hypercoagulable state.  No history of easy bleeding.  No history of anemia  Gastrointestinal: No hematochezia or melena,  No gastroesophageal reflux, no trouble swallowing  Urinary: [ ]  chronic Kidney disease, [ ]  on HD - [ ]  MWF or [ ]  TTHS, [ ]  Burning with urination, [ ]  Frequent urination, [ ]  Difficulty urinating;   Skin: No rashes  Psychological: No history of anxiety,  No history of depression  Social History Social History   Tobacco Use   Smoking status: Former    Current packs/day: 0.00    Average packs/day: 1.5 packs/day for 30.0 years (45.0 ttl pk-yrs)    Types: Cigarettes    Start date: 08/13/1970    Quit date: 08/13/2000    Years since quitting: 23.0    Passive exposure: Never   Smokeless tobacco: Never  Vaping Use   Vaping status: Never Used  Substance Use Topics   Alcohol use: No    Alcohol/week: 0.0 standard drinks of alcohol   Drug use: No    Family History Family History  Problem Relation Age of Onset   Heart disease Mother        Heart Disease before age 36   Hypertension Mother    Hyperlipidemia Mother    Heart attack Mother    Clotting disorder Mother    Heart disease Father        Heart Disease before age 69   Heart attack Father     Hyperlipidemia Father    Hypertension Father    Heart disease Brother        Heart Disease before age 36   Hyperlipidemia Brother    Hypertension Brother    Clotting disorder Brother    AAA (abdominal aortic aneurysm) Brother    Cerebral aneurysm Sister    Hypertension Sister    AAA (abdominal aortic aneurysm) Sister    Asthma Sister    Cerebral aneurysm Brother    Cancer Brother        Lung  Hypertension Brother    Heart attack Brother    Heart disease Brother        Aneurysm of Brain   Hypertension Brother    Heart disease Brother    Heart disease Brother    Stroke Son        Aneurysm of Stomach   AAA (abdominal aortic aneurysm) Son    Cancer Maternal Uncle        great uncle/cancer/type unknown    Allergies  Allergies  Allergen Reactions   Avelox [Moxifloxacin Hcl In Nacl] Palpitations and Other (See Comments)    Caused Heart Attack    Azithromycin Swelling and Other (See Comments)    Patient reported past history of lip swelling   Codeine Other (See Comments)    Dr. Sharyn Lull advised patient not to take this medication   Doxycycline Swelling and Other (See Comments)    Mouth, lips, feet swelling   Hydromorphone Palpitations and Other (See Comments)    DILAUDID  -  Pt had a Heart Attack after taking Dilaudid.   Levaquin [Levofloxacin] Shortness Of Breath and Other (See Comments)    Chest pressure, SOB, "pain in between shoulder blades", sweaty -as reported by patient per experience in ED this afternoon   Vitamin D Analogs Swelling and Other (See Comments)    Face and lips swell, but no breathing issues   Nifedipine Er Other (See Comments)    Dropped the heart rate too low   Octreotide     Chest pain, N/V   Zetia [Ezetimibe] Other (See Comments)    "Made me feel like my face was going to sleep/numb"   Oxycodone-Acetaminophen Other (See Comments)    Says it makes her "feel weird"   Risedronate Other (See Comments)    Chest pain     Current Outpatient  Medications  Medication Sig Dispense Refill   acetaminophen (TYLENOL) 500 MG tablet Take 500-1,000 mg by mouth every 6 (six) hours as needed (for pain).     albuterol (VENTOLIN HFA) 108 (90 Base) MCG/ACT inhaler Inhale 2 puffs into the lungs every 6 (six) hours as needed for wheezing or shortness of breath. 8 g 6   amiodarone (PACERONE) 200 MG tablet Take 100 mg by mouth daily.     atorvastatin (LIPITOR) 40 MG tablet Take 1 tablet (40 mg total) by mouth at bedtime. 30 tablet 6   cetirizine (ZYRTEC) 10 MG tablet Take 10 mg by mouth daily as needed for allergies.      clopidogrel (PLAVIX) 75 MG tablet Take 0.5 tablets (37.5 mg total) by mouth in the morning.     dexlansoprazole (DEXILANT) 60 MG capsule Take 60 mg by mouth daily before breakfast.     fluorouracil (EFUDEX) 5 % cream Apply 1 application  topically See admin instructions. Apply to affected areas of the face once a day     furosemide (LASIX) 40 MG tablet Take 40 mg by mouth in the morning.     magnesium oxide (MAG-OX) 400 (240 Mg) MG tablet Take 400 mg by mouth daily after breakfast.     nitroGLYCERIN (NITROSTAT) 0.4 MG SL tablet Place 1 tablet (0.4 mg total) under the tongue every 5 (five) minutes x 3 doses as needed for chest pain. 10 tablet 0   olmesartan (BENICAR) 20 MG tablet Take 1 tablet (20 mg total) by mouth daily. (Patient taking differently: Take 10 mg by mouth in the morning.) 90 tablet 1   Rimegepant Sulfate (NURTEC) 75 MG TBDP Take 1  tablet (75 mg total) by mouth as needed. 8 tablet 6   TRELEGY ELLIPTA 200-62.5-25 MCG/ACT AEPB USE 1 INHALATION BY MOUTH DAILY (Patient taking differently: Inhale 1 puff into the lungs daily.) 180 each 3   No current facility-administered medications for this visit.    Physical Examination  Vitals:   08/12/23 1357 08/12/23 1402  BP: (!) 180/60 (!) 173/66  Pulse: (!) 47   Temp: (!) 97.5 F (36.4 C)   TempSrc: Temporal   SpO2: 95%   Weight: 142 lb (64.4 kg)     Body mass index is  25.97 kg/m.  General:  Alert and oriented, no acute distress HEENT: Normal Neck: No bruit or JVD Pulmonary: wheezing in all 4 lungs fields to auscultation bilaterally Cardiac: Regular Rate and Rhythm without murmur Abdomen: Soft, non-tender, non-distended, no mass, no scars Skin: No rash Extremity Pulses:  2+ radial,  femoral, dorsalis pedis,  pulses bilaterally Musculoskeletal: No deformity or edema  Neurologic: Upper and lower extremity motor 5/5 and symmetric  DATA:  Right Carotid Findings:  +----------+--------+--------+--------+------------------+--------+           PSV cm/sEDV cm/sStenosisPlaque DescriptionComments  +----------+--------+--------+--------+------------------+--------+  CCA Prox  106                                                 +----------+--------+--------+--------+------------------+--------+  CCA Mid   74      11                                          +----------+--------+--------+--------+------------------+--------+  CCA Distal72      6                                           +----------+--------+--------+--------+------------------+--------+  ICA Prox  74      12      1-39%   homogeneous                 +----------+--------+--------+--------+------------------+--------+  ICA Mid   125     25                                          +----------+--------+--------+--------+------------------+--------+  ICA Distal121     24                                          +----------+--------+--------+--------+------------------+--------+  ECA      140                                                 +----------+--------+--------+--------+------------------+--------+   +----------+--------+-------+----------------+-------------------+           PSV cm/sEDV cmsDescribe        Arm Pressure (mmHG)  +----------+--------+-------+----------------+-------------------+  Subclavian210            Multiphasic, [id number]                  +----------+--------+-------+----------------+-------------------+   +---------+--------+--+--------+--+---------+  VertebralPSV cm/s68EDV cm/s10Antegrade  +---------+--------+--+--------+--+---------+      Left Carotid Findings:  +----------+--------+--------+--------+------------------+--------+           PSV cm/sEDV cm/sStenosisPlaque DescriptionComments  +----------+--------+--------+--------+------------------+--------+  CCA Prox  101     15                                          +----------+--------+--------+--------+------------------+--------+  CCA Mid   93      16                                          +----------+--------+--------+--------+------------------+--------+  CCA Distal84      15              heterogenous                +----------+--------+--------+--------+------------------+--------+  ICA Prox  68      14      1-39%   heterogenous                +----------+--------+--------+--------+------------------+--------+  ICA Mid   93      18                                          +----------+--------+--------+--------+------------------+--------+  ICA Distal142     27                                          +----------+--------+--------+--------+------------------+--------+  ECA      90                                                  +----------+--------+--------+--------+------------------+--------+   +----------+--------+--------+----------------+-------------------+           PSV cm/sEDV cm/sDescribe        Arm Pressure (mmHG)  +----------+--------+--------+----------------+-------------------+  [id number]            Multiphasic, [id number]                  +----------+--------+--------+----------------+-------------------+   +---------+--------+---+--------+--+---------+  VertebralPSV cm/s106EDV cm/s18Antegrade   +---------+--------+---+--------+--+---------+      Summary:  Right Carotid: Velocities in the right ICA are consistent with a 1-39%  stenosis.   Left Carotid: Velocities in the left ICA are consistent with a 1-39%  stenosis.   Vertebrals: Bilateral vertebral arteries demonstrate antegrade flow.  Subclavians: Normal flow hemodynamics were seen in bilateral subclavian               arteries.   ABI Findings:  +---------+------------------+-----+--------+--------+  Right   Rt Pressure (mmHg)IndexWaveformComment   +---------+------------------+-----+--------+--------+  Brachial 167                                      +---------+------------------+-----+--------+--------+  PTA     156               0.93 biphasic          +---------+------------------+-----+--------+--------+  DP      141               0.84 biphasic          +---------+------------------+-----+--------+--------+  Great Toe83                0.50                   +---------+------------------+-----+--------+--------+   +---------+------------------+-----+--------+-------+  Left    Lt Pressure (mmHg)IndexWaveformComment  +---------+------------------+-----+--------+-------+  Brachial 167                                     +---------+------------------+-----+--------+-------+  PTA     142               0.85 biphasic         +---------+------------------+-----+--------+-------+  DP      167               1.00 biphasic         +---------+------------------+-----+--------+-------+  Great Toe99                0.59                  +---------+------------------+-----+--------+-------+   +-------+-----------+-----------+------------+------------+  ABI/TBIToday's ABIToday's TBIPrevious ABIPrevious TBI  +-------+-----------+-----------+------------+------------+  Right 0.93       0.50       0.80        0.53           +-------+-----------+-----------+------------+------------+  Left  1.00       0.59       0.86        0.51          +-------+-----------+-----------+------------+------------+       Bilateral ABIs appear increased compared to prior study on 09/14/2021.    Summary:  Right: Resting right ankle-brachial index indicates mild right lower  extremity arterial disease. The right toe-brachial index is abnormal.   Left: Resting left ankle-brachial index is within normal range. The left  toe-brachial index is abnormal.     ASSESSMENT/PLAN:  82 y/o female with history of B CEA and B common iliac stents.   Carotid duplex shows no stenosis B ICA with < 39% stenosis.  She denies stroke/TIA symptoms.    ABI's demonstrate biphasic inflow with no change.  She has palpable pedal pulses and no symptoms of ischemia.    Continue statin and Plavix  She will follow up in 1 year with repeat carotid duplex and ABI   If she develops symptoms of stroke or TIA she should call 911.        Mosetta Pigeon PA-C [facility] Office: [phone]  Call MD Hetty Blend

## 2023-08-13 LAB — VAS US ABI WITH/WO TBI
Left ABI: 1
Right ABI: 0.93

## 2023-08-20 ENCOUNTER — Ambulatory Visit (HOSPITAL_COMMUNITY)
Admission: RE | Admit: 2023-08-20 | Discharge: 2023-08-20 | Disposition: A | Discharge status: Home / Self Care | Payer: 59 | Source: Ambulatory Visit | Attending: Interventional Radiology | Admitting: Interventional Radiology

## 2023-08-20 DIAGNOSIS — R519 Headache, unspecified: Secondary | ICD-10-CM

## 2023-08-20 DIAGNOSIS — I671 Cerebral aneurysm, nonruptured: Secondary | ICD-10-CM

## 2023-08-21 HISTORY — PX: IR RADIOLOGIST EVAL & MGMT: IMG5224

## 2023-08-22 ENCOUNTER — Other Ambulatory Visit: Payer: Self-pay

## 2023-08-22 DIAGNOSIS — I6523 Occlusion and stenosis of bilateral carotid arteries: Secondary | ICD-10-CM

## 2023-08-22 DIAGNOSIS — I771 Stricture of artery: Secondary | ICD-10-CM

## 2023-08-28 ENCOUNTER — Other Ambulatory Visit (HOSPITAL_COMMUNITY): Payer: Self-pay | Admitting: Interventional Radiology

## 2023-08-28 ENCOUNTER — Telehealth: Payer: Self-pay | Admitting: Psychiatry

## 2023-08-28 DIAGNOSIS — R519 Headache, unspecified: Secondary | ICD-10-CM

## 2023-08-28 DIAGNOSIS — I671 Cerebral aneurysm, nonruptured: Secondary | ICD-10-CM

## 2023-08-28 NOTE — Telephone Encounter (Signed)
 Pt r/s last appt. Wait listed

## 2023-08-28 NOTE — Telephone Encounter (Signed)
 Pt asked appointment be cx, no longer needed

## 2023-08-29 ENCOUNTER — Ambulatory Visit (HOSPITAL_COMMUNITY)
Admission: RE | Admit: 2023-08-29 | Discharge: 2023-08-29 | Disposition: A | Discharge status: Home / Self Care | Payer: 59 | Source: Ambulatory Visit | Attending: Interventional Radiology | Admitting: Interventional Radiology

## 2023-08-29 DIAGNOSIS — R519 Headache, unspecified: Secondary | ICD-10-CM | POA: Diagnosis present

## 2023-08-29 DIAGNOSIS — I671 Cerebral aneurysm, nonruptured: Secondary | ICD-10-CM | POA: Insufficient documentation

## 2023-09-02 ENCOUNTER — Emergency Department (HOSPITAL_BASED_OUTPATIENT_CLINIC_OR_DEPARTMENT_OTHER)
Admission: EM | Admit: 2023-09-02 | Discharge: 2023-09-02 | Discharge status: Against Medical Advice | Payer: 59 | Attending: Emergency Medicine | Admitting: Emergency Medicine

## 2023-09-02 ENCOUNTER — Encounter (HOSPITAL_BASED_OUTPATIENT_CLINIC_OR_DEPARTMENT_OTHER): Payer: Self-pay | Admitting: Emergency Medicine

## 2023-09-02 ENCOUNTER — Other Ambulatory Visit: Payer: Self-pay

## 2023-09-02 ENCOUNTER — Emergency Department (HOSPITAL_BASED_OUTPATIENT_CLINIC_OR_DEPARTMENT_OTHER): Payer: 59

## 2023-09-02 DIAGNOSIS — R200 Anesthesia of skin: Secondary | ICD-10-CM | POA: Insufficient documentation

## 2023-09-02 DIAGNOSIS — H5461 Unqualified visual loss, right eye, normal vision left eye: Secondary | ICD-10-CM | POA: Insufficient documentation

## 2023-09-02 DIAGNOSIS — Z7902 Long term (current) use of antithrombotics/antiplatelets: Secondary | ICD-10-CM | POA: Insufficient documentation

## 2023-09-02 DIAGNOSIS — N184 Chronic kidney disease, stage 4 (severe): Secondary | ICD-10-CM | POA: Insufficient documentation

## 2023-09-02 DIAGNOSIS — I739 Peripheral vascular disease, unspecified: Secondary | ICD-10-CM | POA: Diagnosis not present

## 2023-09-02 DIAGNOSIS — Z85118 Personal history of other malignant neoplasm of bronchus and lung: Secondary | ICD-10-CM | POA: Insufficient documentation

## 2023-09-02 DIAGNOSIS — Z5321 Procedure and treatment not carried out due to patient leaving prior to being seen by health care provider: Secondary | ICD-10-CM | POA: Insufficient documentation

## 2023-09-02 DIAGNOSIS — Z79899 Other long term (current) drug therapy: Secondary | ICD-10-CM | POA: Insufficient documentation

## 2023-09-02 DIAGNOSIS — Z8673 Personal history of transient ischemic attack (TIA), and cerebral infarction without residual deficits: Secondary | ICD-10-CM | POA: Insufficient documentation

## 2023-09-02 DIAGNOSIS — Z951 Presence of aortocoronary bypass graft: Secondary | ICD-10-CM | POA: Insufficient documentation

## 2023-09-02 DIAGNOSIS — J449 Chronic obstructive pulmonary disease, unspecified: Secondary | ICD-10-CM | POA: Diagnosis not present

## 2023-09-02 DIAGNOSIS — E785 Hyperlipidemia, unspecified: Secondary | ICD-10-CM | POA: Diagnosis not present

## 2023-09-02 DIAGNOSIS — I129 Hypertensive chronic kidney disease with stage 1 through stage 4 chronic kidney disease, or unspecified chronic kidney disease: Secondary | ICD-10-CM | POA: Diagnosis not present

## 2023-09-02 DIAGNOSIS — K219 Gastro-esophageal reflux disease without esophagitis: Secondary | ICD-10-CM | POA: Insufficient documentation

## 2023-09-02 DIAGNOSIS — I2510 Atherosclerotic heart disease of native coronary artery without angina pectoris: Secondary | ICD-10-CM | POA: Insufficient documentation

## 2023-09-02 DIAGNOSIS — I10 Essential (primary) hypertension: Secondary | ICD-10-CM

## 2023-09-02 LAB — CBC WITH DIFFERENTIAL/PLATELET
Abs Immature Granulocytes: 0.03 10*3/uL (ref 0.00–0.07)
Basophils Absolute: 0 10*3/uL (ref 0.0–0.1)
Basophils Relative: 0 %
Eosinophils Absolute: 0.1 10*3/uL (ref 0.0–0.5)
Eosinophils Relative: 1 %
HCT: 32.7 % — ABNORMAL LOW (ref 36.0–46.0)
Hemoglobin: 10.8 g/dL — ABNORMAL LOW (ref 12.0–15.0)
Immature Granulocytes: 0 %
Lymphocytes Relative: 5 %
Lymphs Abs: 0.6 10*3/uL — ABNORMAL LOW (ref 0.7–4.0)
MCH: 30.8 pg (ref 26.0–34.0)
MCHC: 33 g/dL (ref 30.0–36.0)
MCV: 93.2 fL (ref 80.0–100.0)
Monocytes Absolute: 0.3 10*3/uL (ref 0.1–1.0)
Monocytes Relative: 3 %
Neutro Abs: 9.2 10*3/uL — ABNORMAL HIGH (ref 1.7–7.7)
Neutrophils Relative %: 91 %
Platelets: 268 10*3/uL (ref 150–400)
RBC: 3.51 MIL/uL — ABNORMAL LOW (ref 3.87–5.11)
RDW: 15.4 % (ref 11.5–15.5)
WBC: 10.1 10*3/uL (ref 4.0–10.5)
nRBC: 0 % (ref 0.0–0.2)

## 2023-09-02 LAB — URINALYSIS, ROUTINE W REFLEX MICROSCOPIC
Bilirubin Urine: NEGATIVE
Glucose, UA: NEGATIVE mg/dL
Hgb urine dipstick: NEGATIVE
Ketones, ur: NEGATIVE mg/dL
Leukocytes,Ua: NEGATIVE
Nitrite: NEGATIVE
Protein, ur: NEGATIVE mg/dL
Specific Gravity, Urine: 1.006 (ref 1.005–1.030)
pH: 5.5 (ref 5.0–8.0)

## 2023-09-02 LAB — PROTIME-INR
INR: 0.9 (ref 0.8–1.2)
Prothrombin Time: 12.3 s (ref 11.4–15.2)

## 2023-09-02 LAB — COMPREHENSIVE METABOLIC PANEL
ALT: 10 U/L (ref 0–44)
AST: 14 U/L — ABNORMAL LOW (ref 15–41)
Albumin: 4.1 g/dL (ref 3.5–5.0)
Alkaline Phosphatase: 58 U/L (ref 38–126)
Anion gap: 8 (ref 5–15)
BUN: 40 mg/dL — ABNORMAL HIGH (ref 8–23)
CO2: 27 mmol/L (ref 22–32)
Calcium: 9 mg/dL (ref 8.9–10.3)
Chloride: 105 mmol/L (ref 98–111)
Creatinine, Ser: 1.88 mg/dL — ABNORMAL HIGH (ref 0.44–1.00)
GFR, Estimated: 27 mL/min — ABNORMAL LOW (ref >60)
Glucose, Bld: 110 mg/dL — ABNORMAL HIGH (ref 70–99)
Potassium: 5.1 mmol/L (ref 3.5–5.1)
Sodium: 140 mmol/L (ref 135–145)
Total Bilirubin: 0.3 mg/dL (ref 0.0–1.2)
Total Protein: 6.8 g/dL (ref 6.5–8.1)

## 2023-09-02 LAB — RAPID URINE DRUG SCREEN, HOSP PERFORMED
Amphetamines: NOT DETECTED
Barbiturates: NOT DETECTED
Benzodiazepines: NOT DETECTED
Cocaine: NOT DETECTED
Opiates: NOT DETECTED
Tetrahydrocannabinol: NOT DETECTED

## 2023-09-02 LAB — CBG MONITORING, ED: Glucose-Capillary: 108 mg/dL — ABNORMAL HIGH (ref 70–99)

## 2023-09-02 LAB — ETHANOL: Alcohol, Ethyl (B): <10 mg/dL (ref <10)

## 2023-09-02 LAB — APTT: aPTT: 25 s (ref 24–36)

## 2023-09-02 LAB — MAGNESIUM: Magnesium: 2 mg/dL (ref 1.7–2.4)

## 2023-09-02 MED ORDER — ACETAMINOPHEN 500 MG PO TABS
1000.0000 mg | ORAL_TABLET | Freq: Once | ORAL | Status: AC
  Administered 2023-09-02: 1000 mg via ORAL
  Filled 2023-09-02: qty 2

## 2023-09-02 NOTE — ED Triage Notes (Signed)
A&Ox4 and ambulatory. C/o R sided facial numbness since today at 0700. States she has had "shooting pains in head" for months but today the symptoms got worse. Denies any new visual changes. No facial droop. No focal deficits. Hx of TIA.

## 2023-09-02 NOTE — Discharge Instructions (Addendum)
Thank you for coming to [facility] Emergency Department. You were seen for funny feeling in the right side of your face. We did a CT which did not show any new findings. We discussed with a neurologist who recommended continued management and follow up outpatient with your neurologist, Dr. Delena Bali, as well as to follow up the results of your most recent MRA.   Please follow up with your neurologist within 1-2 weeks.     On recheck, your blood pressure was elevated >220 systolic. This requires treatment. You were offered treatment in the ED and refused. You elected to leave against medical advice. The risks of blood pressure so high include stroke, permanent neurologic disability, seizure, coma, and death.

## 2023-09-02 NOTE — ED Notes (Signed)
Ambulatory to restroom

## 2023-09-02 NOTE — ED Notes (Signed)
Provider notified of hr in 40s. Patient not in distress at this time

## 2023-09-02 NOTE — ED Notes (Signed)
Reviewed AVS/discharge instruction with patient. Time allotted for and all questions answered. Patient electing to leave AMA all risk reviewed with patient and family, reviewed s/s of emergency conditions and to seek emergency care.

## 2023-09-02 NOTE — ED Notes (Signed)
[facility] called, spoke with Maisie Fus for neurology consult

## 2023-09-02 NOTE — Progress Notes (Signed)
Code stroke activated @ 1123.  Cancelled by EDP.  LKWT changed to 2100 last night with mild symptom of right sided facial numbness.  Josiah Lobo BSN, Occupational hygienist

## 2023-09-02 NOTE — Plan of Care (Signed)
Neurology on-call note  Patient presenting with symptoms that have been in the past seen and attributed to complex migraines.  Head CT unremarkable.  Trial of migraine cocktail and outpatient neurology follow-up recommended  -- Milon Dikes, MD Neurologist [facility]

## 2023-09-02 NOTE — ED Provider Notes (Signed)
[place] EMERGENCY DEPARTMENT AT [facility] Provider Note   CSN: [id number] Arrival date & time: 09/02/23  1112     History  Chief Complaint  Patient presents with   Numbness    [name] is a 83 y.o. female with RLL lung cancer s/p radiation in remission, CAD s/p CABG, PVD, small bowel angiodysplasias with hx GIB, h/o TIA, GERD, COPD, CKD4, anemia, diverticulosis, glaucoma, HTN, HLD who presents with facial numbness.  Has been having "shooting pains" in her head since before christmas. "Funny feeling" and numbness on her face on the right side. Felt normal when she got up at 7:00 AM, but at 7:15 AM she noticed her face was funny, felt like her eye was "drawing down." Some misunderstanding in triage and thought she maybe woke up with it, but she states that it started at 0715. Still feels as though the middle part of her right side of the face is a little numb. Has h/o TIA approx 4-5 months ago. Denies slurred speech, asymmetric weakness, falls, head trauma, changes to her vision or speech. 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 Currently on prednisone for a productive cough that started last week. Had MRA on 08/29/23 but it hasn't been read by radiologist yet. Takes 37.5 mg plavix qd, no other thinners. Doesn't take aspirin.    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   Diverticulitis    Dyspnea    with exertion   Gastroesophageal reflux disease    occ   Glaucoma    right eye   Headache syndrome 11/27/2018   Hiatal hernia    History of shingles 06/23/2018    Hyperlipidemia    Hypertension    Lung cancer (HCC) dx 2018   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 Authorizing Provider  acetaminophen (TYLENOL) 500 MG tablet Take 500-1,000 mg by mouth every 6 (six) hours as needed (for pain).    [provider]  albuterol (VENTOLIN HFA) 108 (90 Base) MCG/ACT inhaler Inhale 2 puffs into the lungs every 6 (six) hours as needed for wheezing or shortness of breath. 05/25/20   Josephine Igo, DO  amiodarone (PACERONE) 200 MG tablet Take 100 mg by mouth daily. 04/03/23   [provider]  atorvastatin (LIPITOR) 40 MG tablet Take 1 tablet (40 mg total) by mouth at bedtime. 09/29/21  Rhyne, Ames Coupe, PA-C  cetirizine (ZYRTEC) 10 MG tablet Take 10 mg by mouth daily as needed for allergies.     [provider]  clopidogrel (PLAVIX) 75 MG tablet Take 0.5 tablets (37.5 mg total) by mouth in the morning. 05/27/23   Lewie Chamber, MD  dexlansoprazole (DEXILANT) 60 MG capsule Take 60 mg by mouth daily before breakfast.    [provider]  fluorouracil (EFUDEX) 5 % cream Apply 1 application  topically See admin instructions. Apply to affected areas of the face once a day 04/25/22   [provider]  furosemide (LASIX) 40 MG tablet Take 40 mg by mouth in the morning. 01/30/21   [provider]  magnesium oxide (MAG-OX) 400 (240  Mg) MG tablet Take 400 mg by mouth daily after breakfast.    [provider]  nitroGLYCERIN (NITROSTAT) 0.4 MG SL tablet Place 1 tablet (0.4 mg total) under the tongue every 5 (five) minutes x 3 doses as needed for chest pain. 08/01/22   Cobb, Ruby Cola, NP  olmesartan (BENICAR) 20 MG tablet Take 1 tablet (20 mg total) by mouth daily. Patient taking differently: Take 10 mg by mouth in the morning. 07/04/20   Almon Hercules, MD  Rimegepant Sulfate (NURTEC) 75 MG TBDP Take 1 tablet (75 mg total) by mouth as needed. 04/02/23   Ocie Doyne, MD  TRELEGY Salomon Mast 200-62.5-25 MCG/ACT AEPB USE 1 INHALATION BY MOUTH DAILY Patient taking differently: Inhale 1 puff into the lungs daily. 02/26/23   Josephine Igo, DO      Allergies    Avelox [moxifloxacin hcl in nacl], Azithromycin, Codeine, Doxycycline, Hydromorphone, Levaquin [levofloxacin], Vitamin d analogs, Nifedipine er, Octreotide, Zetia [ezetimibe], Oxycodone-acetaminophen, and Risedronate    Review of Systems   Review of Systems A 10 point review of systems was performed and is negative unless otherwise reported in HPI.  Physical Exam Updated Vital Signs BP (!) 225/70   Pulse (!) 51   Temp 98.6 F (37 C) (Oral)   Resp 19   Ht 5\' 2"  (1.575 m)   Wt 64.4 kg   SpO2 99%   BMI 25.97 kg/m  Physical Exam General: Normal appearing female, sitting up in bed.  HEENT: PERRLA, EOMI, no nystagmus, Sclera anicteric, MMM, trachea midline.  Cardiology: RRR, no murmurs/rubs/gallops.  Resp: Normal respiratory rate and effort. CTAB, no wheezes, rhonchi, crackles.  Abd: Soft, non-tender, non-distended. No rebound tenderness or guarding.  GU: Deferred. MSK: No peripheral edema or signs of trauma. Extremities without deformity or TTP. No cyanosis or clubbing. Skin: warm, dry. No rashes or lesions. Neuro: A&Ox4, CNs II-XII grossly intact. 5/5 strength all extremities. Sensation to  Psych: Normal mood and affect.   1a  Level of  consciousness: 0=alert; keenly responsive  1b. LOC questions:  0=Performs both tasks correctly  1c. LOC commands: 0=Performs both tasks correctly  2.  Best Gaze: 0=normal  3.  Visual: 0=No visual loss  4. Facial Palsy: 0=Normal symmetric movement  5a.  Motor left arm: 0=No drift, limb holds 90 (or 45) degrees for full 10 seconds  5b.  Motor right arm: 0=No drift, limb holds 90 (or 45) degrees for full 10 seconds  6a. motor left leg: 0=No drift, limb holds 90 (or 45) degrees for full 10 seconds  6b  Motor right leg:  0=No drift, limb holds 90 (or 45) degrees for full 10 seconds  7. Limb Ataxia: 0=Absent  8.  Sensory: 1=Mild to moderate sensory loss; patient feels pinprick  is less sharp or is dull on the affected side; there is a loss of superficial pain with pinprick but patient is aware She is being touched  9. Best Language:  0=No aphasia, normal  10. Dysarthria: 0=Normal  11. Extinction and Inattention: 0=No abnormality   Total:   1        ED Results / Procedures / Treatments   Labs (all labs ordered are listed, but only abnormal results are displayed) Labs Reviewed  CBC WITH DIFFERENTIAL/PLATELET - Abnormal; Notable for the following components:      Result Value   RBC 3.51 (*)    Hemoglobin 10.8 (*)    HCT 32.7 (*)    Neutro Abs 9.2 (*)    Lymphs Abs 0.6 (*)    All other components within normal limits  COMPREHENSIVE METABOLIC PANEL - Abnormal; Notable for the following components:   Glucose, Bld 110 (*)    BUN 40 (*)    Creatinine, Ser 1.88 (*)    AST 14 (*)    GFR, Estimated 27 (*)    All other components within normal limits  URINALYSIS, ROUTINE W REFLEX MICROSCOPIC - Abnormal; Notable for the following components:   Color, Urine COLORLESS (*)    All other components within normal limits  CBG MONITORING, ED - Abnormal; Notable for the following components:   Glucose-Capillary 108 (*)    All other components within normal limits  MAGNESIUM  RAPID URINE DRUG  SCREEN, HOSP PERFORMED  ETHANOL  PROTIME-INR  APTT    EKG EKG Interpretation Date/Time:  Monday September 02 2023 11:21:28 EST Ventricular Rate:  54 PR Interval:  156 QRS Duration:  86 QT Interval:  432 QTC Calculation: 409 R Axis:   75  Text Interpretation: Sinus bradycardia Otherwise normal ECG When compared with ECG of 08-May-2023 11:31, PREVIOUS ECG IS PRESENT Confirmed by Vivi Barrack [phone]) on 09/02/2023 1:51:13 PM  Radiology CTH: 1. No acute intracranial findings. 2. Chronic microvascular ischemia and atrophy.    Procedures Procedures    Medications Ordered in ED Medications  acetaminophen (TYLENOL) tablet 1,000 mg (1,000 mg Oral Given 09/02/23 1428)    ED Course/ Medical Decision Making/ A&P                          Medical Decision Making Amount and/or Complexity of Data Reviewed Labs: ordered. Decision-making details documented in ED Course. Radiology: ordered.  Risk OTC drugs.    This patient presents to the ED for concern of R-sided facial numbness, this involves an extensive number of treatment options, and is a complaint that carries with it a high risk of complications and morbidity.  I considered the following differential and admission for this acute, potentially life threatening condition.   MDM:    Given the acute onset of neurological symptoms, stroke is the most concerning etiology of these acute symptoms. The neuro exam is significant for Altered sensation to R side of face per patient. Also consider TIA, complex migraine, electrolyte derangements. No objective facial palsy to indicate bell's palsy or facial nerve problem.   LKN: 0715 AM (four hours prior to arrival) Glucose: 108 mg/dL AC: No BP: 604/54, then 174/39  Patient presents just outside of TNK window, and NIHSS is only 1.   Clinical Course as of 09/12/23 0810  Mon Sep 02, 2023  1349 CBC shows no leukocytosis but she does have left shift. Baseline anemia. No clinically  significant abnormalities to CMP, magnesium, EtOH,  PT/INR, PTT, or UA. [HN]  1350 Creatinine(!): 1.88 Baseline CKD [HN]  1351 CTH NAICP on my assessment. Will consult to neurology. [HN]  1411 Rapid urine drug screen (hospital performed) neg [HN]  1439 D/w Dr. Jerrell Belfast who states that patient will need outpatient neurology appointment.  [HN]    Clinical Course User Index [HN] Loetta Rough, MD   Her reassuring and patient CT head is negative.  She recently had an MRA which is pending read at this time however patient does not have any objective neurologic findings and per neurology, she has had similar symptoms in the past that have been attributed to a complex migraine.  Patient's headache does improve with Tylenol.  When I went to go tell the patient and discharge her, I was noted that her blood pressure had climbed from the 180s and 190s up to greater than 220 systolic.  Patient states that it is just because she is anxious and angry and would like to go home.  I discussed with the patient that this is a very high blood pressure which can cause things like strokes, heart attacks, kidney injury, and I would like to treat this blood pressure.  She is not due to take any more additional antihypertensives until tomorrow morning.  Patient insist on leaving.  I relayed that a blood pressure this high can lead to significant morbidity mortality and I would like to treat, but she accepts the risks and would like to leave AGAINST MEDICAL ADVICE.  Patient will follow-up with neurology and her PCP about her blood pressure.   Labs: I Ordered, and personally interpreted labs.  The pertinent results include:  those listed above  Imaging Studies ordered: I ordered imaging studies including CTH I independently visualized and interpreted imaging. I agree with the radiologist interpretation  Additional history obtained from chart review, family at bedside.    Cardiac Monitoring: The patient was maintained  on a cardiac monitor.  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   Diverticulitis    Dyspnea    with exertion   Gastroesophageal reflux disease    occ   Glaucoma    right eye   Headache syndrome 11/27/2018   Hiatal hernia    History of shingles 06/23/2018   Hyperlipidemia    Hypertension    Lung cancer (HCC) dx 2018   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   Myocardial infarction (HCC) 01/01/2000   Cardiac catheterization   Peripheral vascular disease (HCC)    stents in legs x 2 or 3   Pneumonia    last time winter 2017 -2018   PONV (postoperative nausea and vomiting)    occassionally, last colonscopy did ok with anesthesia   Primary  cancer of right lower lobe of lung (HCC) 04/25/2016   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   TIA (transient ischemic attack) 02/11/2019   Wears dentures    Full set   Wears glasses      Medicines Meds ordered this encounter  Medications   acetaminophen (TYLENOL) tablet 1,000 mg    I have reviewed the patients home medicines and have made adjustments as needed  Problem List / ED Course: Problem List Items Addressed This Visit   None Visit Diagnoses        Right facial numbness    -  Primary     Hypertension, unspecified type                       This note was created using dictation software, which may contain spelling or grammatical errors.    Loetta Rough, MD 09/12/23 [phone]

## 2023-09-05 ENCOUNTER — Telehealth (HOSPITAL_COMMUNITY): Payer: Self-pay

## 2023-09-05 NOTE — Telephone Encounter (Signed)
Pt agreed to f/u in 1 year with an mra head wo. She agreed to f/u with neurology regarding headaches. AB

## 2023-10-08 ENCOUNTER — Encounter: Payer: Self-pay | Admitting: Dermatology

## 2023-10-08 ENCOUNTER — Ambulatory Visit (INDEPENDENT_AMBULATORY_CARE_PROVIDER_SITE_OTHER): Payer: 59 | Admitting: Dermatology

## 2023-10-08 ENCOUNTER — Ambulatory Visit: Payer: 59 | Admitting: Neurology

## 2023-10-08 VITALS — BP 126/74 | HR 68

## 2023-10-08 DIAGNOSIS — L578 Other skin changes due to chronic exposure to nonionizing radiation: Secondary | ICD-10-CM

## 2023-10-08 DIAGNOSIS — Z7189 Other specified counseling: Secondary | ICD-10-CM

## 2023-10-08 DIAGNOSIS — D229 Melanocytic nevi, unspecified: Secondary | ICD-10-CM

## 2023-10-08 DIAGNOSIS — W908XXA Exposure to other nonionizing radiation, initial encounter: Secondary | ICD-10-CM | POA: Diagnosis not present

## 2023-10-08 DIAGNOSIS — L570 Actinic keratosis: Secondary | ICD-10-CM | POA: Diagnosis not present

## 2023-10-08 DIAGNOSIS — D1801 Hemangioma of skin and subcutaneous tissue: Secondary | ICD-10-CM

## 2023-10-08 DIAGNOSIS — L814 Other melanin hyperpigmentation: Secondary | ICD-10-CM | POA: Diagnosis not present

## 2023-10-08 DIAGNOSIS — Z1283 Encounter for screening for malignant neoplasm of skin: Secondary | ICD-10-CM | POA: Diagnosis not present

## 2023-10-08 DIAGNOSIS — L821 Other seborrheic keratosis: Secondary | ICD-10-CM | POA: Diagnosis not present

## 2023-10-08 DIAGNOSIS — Z85828 Personal history of other malignant neoplasm of skin: Secondary | ICD-10-CM

## 2023-10-08 MED ORDER — FLUOROURACIL 5 % EX CREA
TOPICAL_CREAM | Freq: Two times a day (BID) | CUTANEOUS | 2 refills | Status: AC
Start: 2023-10-08 — End: 2023-11-05

## 2023-10-08 NOTE — Patient Instructions (Addendum)
 Important Information  Due to recent changes in healthcare laws, you may see results of your pathology and/or laboratory studies on MyChart before the doctors have had a chance to review them. We understand that in some cases there may be results that are confusing or concerning to you. Please understand that not all results are received at the same time and often the doctors may need to interpret multiple results in order to provide you with the best plan of care or course of treatment. Therefore, we ask that you please give [place] 2 business days to thoroughly review all your results before contacting the office for clarification. Should we see a critical lab result, you will be contacted sooner.   If You Need Anything After Your Visit  If you have any questions or concerns for your doctor, please call our main line at [phone] If no one answers, please leave a voicemail as directed and we will return your call as soon as possible. Messages left after 4 pm will be answered the following business day.   You may also send [place] a message via MyChart. We typically respond to MyChart messages within 1-2 business days.  For prescription refills, please ask your pharmacy to contact our office. Our fax number is [phone].  If you have an urgent issue when the clinic is closed that cannot wait until the next business day, you can page your doctor at the number below.    Please note that while we do our best to be available for urgent issues outside of office hours, we are not available 24/7.   If you have an urgent issue and are unable to reach [place], you may choose to seek medical care at your doctor's office, retail clinic, urgent care center, or emergency room.  If you have a medical emergency, please immediately call 911 or go to the emergency department. In the event of inclement weather, please call our main line at [phone] for an update on the status of any delays or  closures.  Dermatology Medication Tips: Please keep the boxes that topical medications come in in order to help keep track of the instructions about where and how to use these. Pharmacies typically print the medication instructions only on the boxes and not directly on the medication tubes.   If your medication is too expensive, please contact our office at [phone] or send [place] a message through MyChart.   We are unable to tell what your co-pay for medications will be in advance as this is different depending on your insurance coverage. However, we may be able to find a substitute medication at lower cost or fill out paperwork to get insurance to cover a needed medication.   If a prior authorization is required to get your medication covered by your insurance company, please allow [place] 1-2 business days to complete this process.  Drug prices often vary depending on where the prescription is filled and some pharmacies may offer cheaper prices.  The website www.goodrx.com contains coupons for medications through different pharmacies. The prices here do not account for what the cost may be with help from insurance (it may be cheaper with your insurance), but the website can give you the price if you did not use any insurance.  - You can print the associated coupon and take it with your prescription to the pharmacy.  - You may also stop by our office during regular business hours and pick up a GoodRx coupon card.  - If  you need your prescription sent electronically to a different pharmacy, notify our office through [facility] or by phone at [phone]    Skin Education :   I counseled the patient regarding the following: Sun screen (SPF 30 or greater) should be applied during peak UV exposure (between 10am and 2pm) and reapplied after exercise or swimming.  The ABCDEs of melanoma were reviewed with the patient, and the importance of monthly self-examination of moles was emphasized.  Should any moles change in shape or color, or itch, bleed or burn, pt will contact our office for evaluation sooner then their interval appointment.  Plan: Sunscreen Recommendations I recommended a broad spectrum sunscreen with a SPF of 30 or higher. I explained that SPF 30 sunscreens block approximately 97 percent of the sun's harmful rays. Sunscreens should be applied at least 15 minutes prior to expected sun exposure and then every 2 hours after that as long as sun exposure continues. If swimming or exercising sunscreen should be reapplied every 45 minutes to an hour after getting wet or sweating. One ounce, or the equivalent of a shot glass full of sunscreen, is adequate to protect the skin not covered by a bathing suit. I also recommended a lip balm with a sunscreen as well. Sun protective clothing can be used in lieu of sunscreen but must be worn the entire time you are exposed to the sun's rays.  Patient Handout: Wound Care for Skin Biopsy Site  Taking Care of Your Skin Biopsy Site  Proper care of the biopsy site is essential for promoting healing and minimizing scarring. This handout provides instructions on how to care for your biopsy site to ensure optimal recovery.  1. Cleaning the Wound:  Clean the biopsy site daily with gentle soap and water. Gently pat the area dry with a clean, soft towel. Avoid harsh scrubbing or rubbing the area, as this can irritate the skin and delay healing.  2. Applying Aquaphor and Bandage:  After cleaning the wound, apply a thin layer of Aquaphor ointment to the biopsy site. Cover the area with a sterile bandage to protect it from dirt, bacteria, and friction. Change the bandage daily or as needed if it becomes soiled or wet.  3. Continued Care for One Week:  Repeat the cleaning, Aquaphor application, and bandaging process daily for one week following the biopsy procedure. Keeping the wound clean and moist during this initial healing period will help  prevent infection and promote optimal healing.  4. Massaging Aquaphor into the Area:  ---After one week, discontinue the use of bandages but continue to apply Aquaphor to the biopsy site. ----Gently massage the Aquaphor into the area using circular motions. ---Massaging the skin helps to promote circulation and prevent the formation of scar tissue.   Additional Tips:  Avoid exposing the biopsy site to direct sunlight during the healing process, as this can cause hyperpigmentation or worsen scarring. If you experience any signs of infection, such as increased redness, swelling, warmth, or drainage from the wound, contact your healthcare provider immediately. Follow any additional instructions provided by your healthcare provider for caring for the biopsy site and managing any discomfort. Conclusion:  Taking proper care of your skin biopsy site is crucial for ensuring optimal healing and minimizing scarring. By following these instructions for cleaning, applying Aquaphor, and massaging the area, you can promote a smooth and successful recovery. If you have any questions or concerns about caring for your biopsy site, don't hesitate to contact your healthcare  provider for guidance.  For areas treated with Liquid Nitrogen:  Keep clean with soap and water.  Apply Vaseline or Aquaphor twice daily. - Start 5-fluorouracil cream twice a day for 28 days to affected areas including forehead.  Reviewed course of treatment and expected reaction.  Patient advised to expect inflammation and crusting and advised that erosions are possible.  Patient advised to be diligent with sun protection during and after treatment. Handout with details of how to apply medication and what to expect provided. Counseled to keep medication out of reach of children and pets.

## 2023-10-08 NOTE — Progress Notes (Signed)
 New Patient Visit   Subjective  [name] is a 83 y.o. female who presents for the following: Skin Cancer Screening and Full Body Skin Exam.  Hx of NMSC.  She is accompanied by her son.  The patient presents for Total-Body Skin Exam (TBSE) for skin cancer screening and mole check. The patient has spots, moles and lesions to be evaluated, some may be new or changing.  The following portions of the chart were reviewed this encounter and updated as appropriate: medications, allergies, medical history  Review of Systems:  No other skin or systemic complaints except as noted in HPI or Assessment and Plan.  Objective  Well appearing patient in no apparent distress; mood and affect are within normal limits.  A full examination was performed including scalp, head, eyes, ears, nose, lips, neck, chest, axillae, abdomen, back, buttocks, bilateral upper extremities, bilateral lower extremities, hands, feet, fingers, toes, fingernails, and toenails. All findings within normal limits unless otherwise noted below.   Relevant physical exam findings are noted in the Assessment and Plan.    Assessment & Plan   SKIN CANCER SCREENING PERFORMED TODAY.  ACTINIC DAMAGE - Chronic condition, secondary to cumulative UV/sun exposure - diffuse scaly erythematous macules with underlying dyspigmentation - Recommend daily broad spectrum sunscreen SPF 30+ to sun-exposed areas, reapply every 2 hours as needed.  - Staying in the shade or wearing long sleeves, sun glasses (UVA+UVB protection) and wide brim hats (4-inch brim around the entire circumference of the hat) are also recommended for sun protection.  - Call for new or changing lesions.  LENTIGINES, SEBORRHEIC KERATOSES, HEMANGIOMAS - Benign normal skin lesions - Benign-appearing - Call for any changes  MELANOCYTIC NEVI - Tan-brown and/or pink-flesh-colored symmetric macules and papules - Benign appearing on exam today - Observation - Call clinic  for new or changing moles - Recommend daily use of broad spectrum spf 30+ sunscreen to sun-exposed areas.   ACTINIC KERATOSIS Exam: Erythematous thin papules/macules with gritty scale at the forehead  Actinic keratoses are precancerous spots that appear secondary to cumulative UV radiation exposure/sun exposure over time. They are chronic with expected duration over 1 year. A portion of actinic keratoses will progress to squamous cell carcinoma of the skin. It is not possible to reliably predict which spots will progress to skin cancer and so treatment is recommended to prevent development of skin cancer.  Recommend daily broad spectrum sunscreen SPF 30+ to sun-exposed areas, reapply every 2 hours as needed.  Recommend staying in the shade or wearing long sleeves, sun glasses (UVA+UVB protection) and wide brim hats (4-inch brim around the entire circumference of the hat). Call for new or changing lesions.  Treatment Plan: Start 5-fluorouracil cream twice a day for 28 days to affected areas including forehead.  Reviewed course of treatment and expected reaction.  Patient advised to expect inflammation and crusting and advised that erosions are possible.  Patient advised to be diligent with sun protection during and after treatment. Handout with details of how to apply medication and what to expect provided. Counseled to keep medication out of reach of children and pets.  Reviewed course of treatment and expected reaction.  Patient advised to expect inflammation and crusting and advised that erosions are possible.  Patient advised to be diligent with sun protection during and after treatment. Handout with details of how to apply medication and what to expect provided. Counseled to keep medication out of reach of children and pets.  AK (ACTINIC KERATOSIS) (5) Left  Forehead, Mid Forehead, Mid Frontal Scalp, Mid Parietal Scalp, Right Forehead Apply Efudex to forehead for 4 wks Destruction of lesion -  Mid Frontal Scalp, Mid Parietal Scalp Complexity: simple   Destruction method: cryotherapy   Informed consent: discussed and consent obtained   Timeout:  patient name, date of birth, surgical site, and procedure verified Lesion destroyed using liquid nitrogen: Yes   Outcome: patient tolerated procedure well with no complications   Post-procedure details: wound care instructions given   Related Medications fluorouracil (EFUDEX) 5 % cream Apply topically 2 (two) times daily for 28 days. Apply to foreahead 2x per day for 4wks  Return in about 8 weeks (around 12/03/2023) for F/U after efudex treatment .  Dominga Ferry, Surg Tech III, am acting as scribe for Gwenith Daily, MD.   Documentation: I have reviewed the above documentation for accuracy and completeness, and I agree with the above.  Gwenith Daily, MD

## 2023-11-19 ENCOUNTER — Emergency Department (HOSPITAL_COMMUNITY)
Admission: EM | Admit: 2023-11-19 | Discharge: 2023-11-20 | Disposition: A | Discharge status: Home / Self Care | Attending: Emergency Medicine | Admitting: Emergency Medicine

## 2023-11-19 ENCOUNTER — Other Ambulatory Visit: Payer: Self-pay

## 2023-11-19 ENCOUNTER — Encounter (HOSPITAL_COMMUNITY): Payer: Self-pay

## 2023-11-19 DIAGNOSIS — I130 Hypertensive heart and chronic kidney disease with heart failure and stage 1 through stage 4 chronic kidney disease, or unspecified chronic kidney disease: Secondary | ICD-10-CM | POA: Insufficient documentation

## 2023-11-19 DIAGNOSIS — R519 Headache, unspecified: Secondary | ICD-10-CM | POA: Diagnosis present

## 2023-11-19 DIAGNOSIS — Z951 Presence of aortocoronary bypass graft: Secondary | ICD-10-CM | POA: Insufficient documentation

## 2023-11-19 DIAGNOSIS — I2510 Atherosclerotic heart disease of native coronary artery without angina pectoris: Secondary | ICD-10-CM | POA: Diagnosis not present

## 2023-11-19 DIAGNOSIS — Z79899 Other long term (current) drug therapy: Secondary | ICD-10-CM | POA: Insufficient documentation

## 2023-11-19 DIAGNOSIS — Z7901 Long term (current) use of anticoagulants: Secondary | ICD-10-CM | POA: Insufficient documentation

## 2023-11-19 DIAGNOSIS — I159 Secondary hypertension, unspecified: Secondary | ICD-10-CM

## 2023-11-19 DIAGNOSIS — I509 Heart failure, unspecified: Secondary | ICD-10-CM | POA: Insufficient documentation

## 2023-11-19 DIAGNOSIS — N189 Chronic kidney disease, unspecified: Secondary | ICD-10-CM | POA: Insufficient documentation

## 2023-11-19 LAB — URINALYSIS, ROUTINE W REFLEX MICROSCOPIC
Bilirubin Urine: NEGATIVE
Glucose, UA: NEGATIVE mg/dL
Hgb urine dipstick: NEGATIVE
Ketones, ur: NEGATIVE mg/dL
Leukocytes,Ua: NEGATIVE
Nitrite: NEGATIVE
Protein, ur: NEGATIVE mg/dL
Specific Gravity, Urine: 1.01 (ref 1.005–1.030)
pH: 5 (ref 5.0–8.0)

## 2023-11-19 LAB — CBC
HCT: 27.2 % — ABNORMAL LOW (ref 36.0–46.0)
Hemoglobin: 8.5 g/dL — ABNORMAL LOW (ref 12.0–15.0)
MCH: 30.8 pg (ref 26.0–34.0)
MCHC: 31.3 g/dL (ref 30.0–36.0)
MCV: 98.6 fL (ref 80.0–100.0)
Platelets: 263 10*3/uL (ref 150–400)
RBC: 2.76 MIL/uL — ABNORMAL LOW (ref 3.87–5.11)
RDW: 13.2 % (ref 11.5–15.5)
WBC: 6.3 10*3/uL (ref 4.0–10.5)
nRBC: 0 % (ref 0.0–0.2)

## 2023-11-19 LAB — BASIC METABOLIC PANEL WITH GFR
Anion gap: 9 (ref 5–15)
BUN: 31 mg/dL — ABNORMAL HIGH (ref 8–23)
CO2: 24 mmol/L (ref 22–32)
Calcium: 9.3 mg/dL (ref 8.9–10.3)
Chloride: 108 mmol/L (ref 98–111)
Creatinine, Ser: 1.71 mg/dL — ABNORMAL HIGH (ref 0.44–1.00)
GFR, Estimated: 30 mL/min — ABNORMAL LOW (ref >60)
Glucose, Bld: 117 mg/dL — ABNORMAL HIGH (ref 70–99)
Potassium: 4.3 mmol/L (ref 3.5–5.1)
Sodium: 141 mmol/L (ref 135–145)

## 2023-11-19 LAB — CBG MONITORING, ED: Glucose-Capillary: 85 mg/dL (ref 70–99)

## 2023-11-19 MED ORDER — SODIUM CHLORIDE 0.9 % IV BOLUS
1000.0000 mL | Freq: Once | INTRAVENOUS | Status: AC
  Administered 2023-11-20: 1000 mL via INTRAVENOUS

## 2023-11-19 NOTE — ED Triage Notes (Signed)
 Pt arrived reporting weakness, and hypertension. States today BP has been high and she has been taking her bp medications. Endorses headache all day. Denies blurred vision, cp or dizziness. No other symptoms reported

## 2023-11-19 NOTE — ED Notes (Signed)
Patient has urine culture in the main lab °

## 2023-11-20 ENCOUNTER — Emergency Department (HOSPITAL_COMMUNITY)

## 2023-11-20 ENCOUNTER — Telehealth: Payer: Self-pay | Admitting: *Deleted

## 2023-11-20 LAB — TROPONIN I (HIGH SENSITIVITY)
Troponin I (High Sensitivity): 24 ng/L — ABNORMAL HIGH (ref <18)
Troponin I (High Sensitivity): 24 ng/L — ABNORMAL HIGH (ref <18)

## 2023-11-20 MED ORDER — ACETAMINOPHEN 325 MG PO TABS
650.0000 mg | ORAL_TABLET | Freq: Once | ORAL | Status: AC
  Administered 2023-11-20: 650 mg via ORAL
  Filled 2023-11-20: qty 2

## 2023-11-20 MED ORDER — IOHEXOL 350 MG/ML SOLN
100.0000 mL | Freq: Once | INTRAVENOUS | Status: AC | PRN
  Administered 2023-11-20: 100 mL via INTRAVENOUS

## 2023-11-20 MED ORDER — NITROGLYCERIN 2 % TD OINT
0.5000 [in_us] | TOPICAL_OINTMENT | Freq: Once | TRANSDERMAL | Status: AC
  Administered 2023-11-20: 0.5 [in_us] via TOPICAL
  Filled 2023-11-20: qty 1

## 2023-11-20 NOTE — ED Notes (Signed)
 Pt ambulated independently to restroom, gait steady

## 2023-11-20 NOTE — ED Notes (Signed)
 pt was walked SpO2 remained above 94%. Pt work of breathing and BP increased.

## 2023-11-20 NOTE — ED Provider Notes (Signed)
 I assumed care of this patient from previous provider.  Please see their note for further details of history, exam, and MDM.   CTA negative for dissection.  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  Will follow-up with Dr. Shirline Frees. CT head negative for ICH.  Medication adjustments recommended.  Close PCP follow-up.  The patient appears reasonably screened and/or stabilized for discharge and I doubt any other medical condition or other [facility] requiring further screening, evaluation, or treatment in the ED at this time. I have discussed the findings, Dx and Tx plan with the patient/family who expressed understanding and agree(s) with the plan. Discharge instructions discussed at length. The patient/family was given strict return precautions who verbalized understanding of the instructions. No further questions at time of discharge.  Disposition: Discharge  Condition: Good  ED Discharge Orders     None        Follow Up: Tracey Harries, MD [address] [place] [address] [phone]  Schedule an appointment as soon as possible for a visit in 1 week   Si Gaul, MD [address] [place] [address] [phone]  Call  to schedule an appointment for close follow up         Karry Barrilleaux, Amadeo Garnet, MD 11/20/23 [phone]

## 2023-11-20 NOTE — ED Notes (Signed)
 Pt tolerated walking well a little short of breath. Once in the room pt heart is 57, O2 is 97 and B/P is 194/57 with a map of 96.

## 2023-11-20 NOTE — ED Provider Notes (Signed)
 [place] EMERGENCY DEPARTMENT AT [facility] Provider Note   CSN: [id number] Arrival date & time: 11/19/23  1805     History  Chief Complaint  Patient presents with   Weakness   Hypertension    [name] is a 83 y.o. female.  With an extensive cardiac history including CAD, status post three-vessel CABG, heart failure, carotid stenosis, CKD and COPD who presents to the ED for hypertension.  Patient has been monitoring her blood pressure closely at home and has had multiple elevated readings recently.  This seems to coincide with when she walks for short distance.  Systolic blood pressure measured above 200s at home today.  During these episodes she does note pain between her shoulder blades and headaches as well.  Denies shortness of breath nausea and vomiting but states she does become very clammy during these episodes.  Reports compliance with her medications at home including Plavix and antihypertensive regimen.   Weakness Hypertension       Home Medications Prior to Admission medications   Medication Sig Start Date End Date Taking? Authorizing Provider  acetaminophen (TYLENOL) 500 MG tablet Take 500-1,000 mg by mouth every 6 (six) hours as needed (for pain).    [provider]  albuterol (VENTOLIN HFA) 108 (90 Base) MCG/ACT inhaler Inhale 2 puffs into the lungs every 6 (six) hours as needed for wheezing or shortness of breath. 05/25/20   Josephine Igo, DO  amiodarone (PACERONE) 200 MG tablet Take 100 mg by mouth daily. 04/03/23   [provider]  atorvastatin (LIPITOR) 40 MG tablet Take 1 tablet (40 mg total) by mouth at bedtime. 09/29/21   Rhyne, Ames Coupe, PA-C  cetirizine (ZYRTEC) 10 MG tablet Take 10 mg by mouth daily as needed for allergies.     [provider]  clopidogrel (PLAVIX) 75 MG tablet Take 0.5 tablets (37.5 mg total) by mouth in the morning. 05/27/23   Lewie Chamber, MD  dexlansoprazole (DEXILANT) 60 MG capsule Take  60 mg by mouth daily before breakfast.    [provider]  furosemide (LASIX) 40 MG tablet Take 40 mg by mouth in the morning. 01/30/21   [provider]  magnesium oxide (MAG-OX) 400 (240 Mg) MG tablet Take 400 mg by mouth daily after breakfast.    [provider]  nitroGLYCERIN (NITROSTAT) 0.4 MG SL tablet Place 1 tablet (0.4 mg total) under the tongue every 5 (five) minutes x 3 doses as needed for chest pain. 08/01/22   Cobb, Ruby Cola, NP  olmesartan (BENICAR) 20 MG tablet Take 1 tablet (20 mg total) by mouth daily. Patient taking differently: Take 10 mg by mouth in the morning. 07/04/20   Almon Hercules, MD  Rimegepant Sulfate (NURTEC) 75 MG TBDP Take 1 tablet (75 mg total) by mouth as needed. 04/02/23   Ocie Doyne, MD  TRELEGY Salomon Mast 200-62.5-25 MCG/ACT AEPB USE 1 INHALATION BY MOUTH DAILY Patient taking differently: Inhale 1 puff into the lungs daily. 02/26/23   Josephine Igo, DO      Allergies    Avelox [moxifloxacin hcl in nacl], Azithromycin, Codeine, Doxycycline, Hydromorphone, Levaquin [levofloxacin], Vitamin d analogs, Nifedipine er, Octreotide, Zetia [ezetimibe], Oxycodone-acetaminophen, and Risedronate    Review of Systems   Review of Systems  Neurological:  Positive for weakness.    Physical Exam Updated Vital Signs BP (!) 189/63   Pulse 60   Temp 98.3 F (36.8 C)   Resp 18   Ht 5\' 2"  (1.575  m)   Wt 64.4 kg   SpO2 96%   BMI 25.97 kg/m  Physical Exam Vitals and nursing note reviewed.  HENT:     Head: Normocephalic and atraumatic.  Eyes:     Extraocular Movements: Extraocular movements intact.     Pupils: Pupils are equal, round, and reactive to light.  Cardiovascular:     Rate and Rhythm: Normal rate and regular rhythm.     Pulses: Normal pulses.     Comments: Symmetric DP and radial pulses 2+ bilaterally Pulmonary:     Effort: Pulmonary effort is normal.     Breath sounds: Normal breath sounds.  Abdominal:     Palpations:  Abdomen is soft.     Tenderness: There is no abdominal tenderness.  Skin:    General: Skin is warm and dry.  Neurological:     General: No focal deficit present.     Mental Status: She is alert and oriented to person, place, and time.     Sensory: No sensory deficit.     Motor: No weakness.  Psychiatric:        Mood and Affect: Mood normal.     ED Results / Procedures / Treatments   Labs (all labs ordered are listed, but only abnormal results are displayed) Labs Reviewed  BASIC METABOLIC PANEL WITH GFR - Abnormal; Notable for the following components:      Result Value   Glucose, Bld 117 (*)    BUN 31 (*)    Creatinine, Ser 1.71 (*)    GFR, Estimated 30 (*)    All other components within normal limits  CBC - Abnormal; Notable for the following components:   RBC 2.76 (*)    Hemoglobin 8.5 (*)    HCT 27.2 (*)    All other components within normal limits  URINALYSIS, ROUTINE W REFLEX MICROSCOPIC - Abnormal; Notable for the following components:   Color, Urine STRAW (*)    All other components within normal limits  CBG MONITORING, ED  TROPONIN I (HIGH SENSITIVITY)    EKG EKG Interpretation Date/Time:  Tuesday November 19 2023 18:51:02 EDT Ventricular Rate:  57 PR Interval:    QRS Duration:  102 QT Interval:  414 QTC Calculation: 404 R Axis:   76  Text Interpretation: Atrial fibrillation Paired ventricular premature complexes Low voltage, precordial leads Confirmed by Estelle June [phone]) on 11/20/2023 12:12:20 AM  Radiology No results found.  Procedures Procedures    Medications Ordered in ED Medications  sodium chloride 0.9 % bolus 1,000 mL (1,000 mLs Intravenous New Bag/Given 11/20/23 0044)  acetaminophen (TYLENOL) tablet 650 mg (650 mg Oral Given 11/20/23 0047)    ED Course/ Medical Decision Making/ A&P Clinical Course as of 11/20/23 0056  Wed Nov 20, 2023  0055 Jackquline Bosch DO, am transitioning care of this patient to the oncoming provider pending pending  troponin, CT head, CTA chest abdomen pelvis, reevaluation and disposition [MP]    Clinical Course User Index [MP] Royanne Foots, DO                                 Medical Decision Making 83 year old female with history as above presenting given concern for hypertensive emergency with associated back pain and headaches.  Initial blood pressure in 140s here.  Will evaluate for evidence of endorgan damage with labs including high sensitive troponin and EKG.  She does note associated pain between her  shoulder blades during episodes of hypertension which is concerning for potential aortic aneurysm versus dissection.  Will evaluate aorta with CTA here.  Headaches could be concerning for intraparenchymal hemorrhage in the setting of uncontrolled hypertension.  No focal neurologic deficits on my exam.  Will evaluate with CT head Noncon here.  Patient requesting Tylenol for her headache which is a 5 out of 10 upon my evaluation.  Amount and/or Complexity of Data Reviewed Labs: ordered. Radiology: ordered.  Risk OTC drugs.           Final Clinical Impression(s) / ED Diagnoses Final diagnoses:  Secondary hypertension  Acute nonintractable headache, unspecified headache type    Rx / DC Orders ED Discharge Orders     None         Royanne Foots, DO 11/20/23 1478

## 2023-11-20 NOTE — Discharge Instructions (Addendum)
 You were seen in the Emergency Department for high blood pressure as well as pain in your head and back pain. The CAT scan taken of your head did not show any evidence of stroke The CAT scan taken of your aorta did not show any evidence of dissection or aneurysm.  It did show worsening pulmonary nodule.  You will need to follow-up with your oncologist regarding this. Is important you continue taking all previous prescribed medications (as we discussed, increase your olmesartan to 20 mg again).  Keep Lasix at 20 mg.  Follow-up with your primary care doctor tomorrow for reevaluation of your blood pressure and to be seen in the office. Return to the emerged part for chest pain, trouble breathing, severe headaches or any other concerns

## 2023-11-20 NOTE — Telephone Encounter (Signed)
 Patient called office this AM, was in [facility] ED last night and had CT C/A/P and head.  ED informed patient they'd seen a growth on her lower right lung and told her they had contacted Dr. Arbutus Ped to inform him. ED advised patient to contact Dr. Asa Lente office and ask to be seen in the next week. She currently has lab only on 4/15 and appt with MD in October.   Dr. Arbutus Ped and C. Heilingoetter, PA informed of above information with request to advise on appointments needed in next week. Per Ms. Heilingoetter, she sent schedule message for patient appt for next week.   Contacted patient with information that MD and PA have been advised of her ED visit and CTs. Scheduling will contact her to schedule appt for next week. Patient verbalized understanding.

## 2023-11-21 ENCOUNTER — Telehealth: Payer: Self-pay | Admitting: Internal Medicine

## 2023-11-21 NOTE — Telephone Encounter (Signed)
 Scheduled appointments and the patient is aware of the changes made.

## 2023-11-26 ENCOUNTER — Other Ambulatory Visit: Payer: 59

## 2023-11-27 ENCOUNTER — Other Ambulatory Visit: Payer: Self-pay | Admitting: Internal Medicine

## 2023-11-27 ENCOUNTER — Inpatient Hospital Stay: Admitting: Internal Medicine

## 2023-11-27 ENCOUNTER — Inpatient Hospital Stay: Attending: Internal Medicine

## 2023-11-27 VITALS — BP 160/63 | HR 65 | Temp 97.9°F | Resp 18 | Ht 62.0 in | Wt 140.5 lb

## 2023-11-27 DIAGNOSIS — C3490 Malignant neoplasm of unspecified part of unspecified bronchus or lung: Secondary | ICD-10-CM

## 2023-11-27 DIAGNOSIS — D509 Iron deficiency anemia, unspecified: Secondary | ICD-10-CM | POA: Insufficient documentation

## 2023-11-27 DIAGNOSIS — I10 Essential (primary) hypertension: Secondary | ICD-10-CM | POA: Insufficient documentation

## 2023-11-27 DIAGNOSIS — R918 Other nonspecific abnormal finding of lung field: Secondary | ICD-10-CM | POA: Diagnosis not present

## 2023-11-27 DIAGNOSIS — Z923 Personal history of irradiation: Secondary | ICD-10-CM | POA: Insufficient documentation

## 2023-11-27 DIAGNOSIS — R5383 Other fatigue: Secondary | ICD-10-CM | POA: Diagnosis not present

## 2023-11-27 DIAGNOSIS — R0602 Shortness of breath: Secondary | ICD-10-CM | POA: Diagnosis not present

## 2023-11-27 DIAGNOSIS — C3431 Malignant neoplasm of lower lobe, right bronchus or lung: Secondary | ICD-10-CM | POA: Diagnosis present

## 2023-11-27 LAB — CBC WITH DIFFERENTIAL (CANCER CENTER ONLY)
Abs Immature Granulocytes: 0.01 10*3/uL (ref 0.00–0.07)
Basophils Absolute: 0.1 10*3/uL (ref 0.0–0.1)
Basophils Relative: 1 %
Eosinophils Absolute: 0.4 10*3/uL (ref 0.0–0.5)
Eosinophils Relative: 7 %
HCT: 27.8 % — ABNORMAL LOW (ref 36.0–46.0)
Hemoglobin: 9 g/dL — ABNORMAL LOW (ref 12.0–15.0)
Immature Granulocytes: 0 %
Lymphocytes Relative: 30 %
Lymphs Abs: 1.8 10*3/uL (ref 0.7–4.0)
MCH: 30.3 pg (ref 26.0–34.0)
MCHC: 32.4 g/dL (ref 30.0–36.0)
MCV: 93.6 fL (ref 80.0–100.0)
Monocytes Absolute: 0.6 10*3/uL (ref 0.1–1.0)
Monocytes Relative: 10 %
Neutro Abs: 3 10*3/uL (ref 1.7–7.7)
Neutrophils Relative %: 52 %
Platelet Count: 303 10*3/uL (ref 150–400)
RBC: 2.97 MIL/uL — ABNORMAL LOW (ref 3.87–5.11)
RDW: 12.9 % (ref 11.5–15.5)
WBC Count: 5.9 10*3/uL (ref 4.0–10.5)
nRBC: 0 % (ref 0.0–0.2)

## 2023-11-27 LAB — FERRITIN: Ferritin: 14 ng/mL (ref 11–307)

## 2023-11-27 LAB — IRON AND IRON BINDING CAPACITY (CC-WL,HP ONLY)
Iron: 31 ug/dL (ref 28–170)
Saturation Ratios: 8 % — ABNORMAL LOW (ref 10.4–31.8)
TIBC: 391 ug/dL (ref 250–450)
UIBC: 360 ug/dL (ref 148–442)

## 2023-11-27 LAB — SAMPLE TO BLOOD BANK

## 2023-11-27 NOTE — Progress Notes (Signed)
 [facility] [phone]   [phone]  OFFICE PROGRESS NOTE  Tracey Harries, MD [address] [place] [employer] [place] [address]  DIAGNOSIS:  1) stage IA non-small cell lung cancer, squamous cell carcinoma presented with right lower lobe pulmonary nodule diagnosed in August 2017. 2) persistent anemia questionable for anemia of chronic disease plus/minus iron deficiency.  PRIOR THERAPY:  1) Feraheme infusion on as-needed basis. 2) stereotactic radiotherapy to the right lower lobe lung nodule under the care of Dr. Kathrynn Running.  CURRENT THERAPY: Observation.  INTERVAL HISTORY: [name] 83 y.o. female returns to the clinic today for follow-up visit accompanied by her daughter.Discussed the use of AI scribe software for clinical note transcription with the patient, who gave verbal consent to proceed.  History of Present Illness   [name] is an 83 year old female with stage one A non-small cell lung cancer and persistent anemia who presents for evaluation and repeat blood work as well as CT scan for restaging of her disease.  She has a history of stage one A non-small cell lung cancer, specifically squamous cell carcinoma, diagnosed in August 2017. She underwent stereotactic radiotherapy to the right lower lobe. A recent scan showed the area in the right lower lobe is enlarging compared to previous scans. She experiences episodes of low oxygen saturation, with levels dropping to 70% at home and 90% during a visit to another doctor's office. She feels worse overall, with symptoms including a persistent cough. No chest pain or hemoptysis. She had a CT scan last week and is scheduled for further imaging.  She has persistent anemia, consistent with anemia of chronic disease and iron deficiency. She has received iron infusions with Feraheme in the past. Her hemoglobin level is currently 9, an improvement from 8.5 last week. She experiences symptoms of  anemia such as weakness, fatigue, and cold, clammy sweats, especially when standing or shopping.  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   Diverticulitis    Dyspnea    with exertion   Gastroesophageal reflux disease    occ   Glaucoma    right eye   Headache syndrome 11/27/2018   Hiatal hernia    History of shingles 06/23/2018   Hyperlipidemia    Hypertension    Lung cancer (HCC) dx 2018   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   Myocardial infarction (HCC) 01/01/2000   Cardiac catheterization   Peripheral vascular disease (HCC)    stents in legs x 2 or 3   Pneumonia    last time winter 2017 -2018   PONV (postoperative nausea and vomiting)    occassionally, last colonscopy did ok with anesthesia   Primary cancer of right lower lobe of lung (HCC) 04/25/2016   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   TIA (transient ischemic attack) 02/11/2019   Wears dentures  Full set   Wears glasses     ALLERGIES:  is allergic to avelox [moxifloxacin hcl in nacl], azithromycin, codeine, doxycycline, hydromorphone, levaquin [levofloxacin], vitamin d analogs, nifedipine er, octreotide, zetia [ezetimibe], oxycodone-acetaminophen, and risedronate.  MEDICATIONS:  Current Outpatient Medications  Medication Sig Dispense Refill   acetaminophen (TYLENOL) 500 MG tablet Take 500-1,000 mg by mouth every 6 (six) hours as needed (for pain).     albuterol  (VENTOLIN HFA) 108 (90 Base) MCG/ACT inhaler Inhale 2 puffs into the lungs every 6 (six) hours as needed for wheezing or shortness of breath. 8 g 6   amiodarone (PACERONE) 200 MG tablet Take 100 mg by mouth daily. (Patient not taking: Reported on 11/20/2023)     amLODipine (NORVASC) 2.5 MG tablet Take 2.5 mg by mouth daily. (Patient not taking: Reported on 11/20/2023)     atorvastatin (LIPITOR) 40 MG tablet Take 1 tablet (40 mg total) by mouth at bedtime. (Patient taking differently: Take 20 mg by mouth at bedtime.) 30 tablet 6   cetirizine (ZYRTEC) 10 MG tablet Take 10 mg by mouth daily as needed for allergies.      clopidogrel (PLAVIX) 75 MG tablet Take 0.5 tablets (37.5 mg total) by mouth in the morning.     dexlansoprazole (DEXILANT) 60 MG capsule Take 60 mg by mouth daily before breakfast.     furosemide (LASIX) 40 MG tablet Take 20 mg by mouth in the morning.     magnesium oxide (MAG-OX) 400 (240 Mg) MG tablet Take 400 mg by mouth daily after breakfast.     nitroGLYCERIN (NITRODUR - DOSED IN MG/24 HR) 0.2 mg/hr patch Place 0.2 mg onto the skin daily. Patient wears patch for 12 hours and removes     nitroGLYCERIN (NITROSTAT) 0.4 MG SL tablet Place 1 tablet (0.4 mg total) under the tongue every 5 (five) minutes x 3 doses as needed for chest pain. 10 tablet 0   olmesartan (BENICAR) 20 MG tablet Take 1 tablet (20 mg total) by mouth daily. (Patient taking differently: Take 10 mg by mouth in the morning.) 90 tablet 1   predniSONE (DELTASONE) 10 MG tablet Take 10 mg by mouth daily with breakfast. (Patient not taking: Reported on 11/20/2023)     Rimegepant Sulfate (NURTEC) 75 MG TBDP Take 1 tablet (75 mg total) by mouth as needed. (Patient not taking: Reported on 11/20/2023) 8 tablet 6   TRELEGY ELLIPTA 200-62.5-25 MCG/ACT AEPB USE 1 INHALATION BY MOUTH DAILY (Patient taking differently: Inhale 1 puff into the lungs daily.) 180 each 3   No current facility-administered medications for this visit.     SURGICAL HISTORY:  Past Surgical History:  Procedure Laterality Date   ABDOMINAL HYSTERECTOMY     APPENDECTOMY     Arnold-chiari malformation repair  1998   Suboccipital craniectomy   CAROTID ENDARTERECTOMY  03/29/2010   Left  CEA   CHOLECYSTECTOMY     Gall Bladder   COLONOSCOPY WITH PROPOFOL N/A 04/22/2015   Procedure: COLONOSCOPY WITH PROPOFOL;  Surgeon: Alvis Jourdain, MD;  Location: [facility];  Service: Endoscopy;  Laterality: N/A;   COLONOSCOPY WITH PROPOFOL N/A 05/25/2016   Procedure: COLONOSCOPY WITH PROPOFOL;  Surgeon: Alvis Jourdain, MD;  Location: [facility];  Service: Endoscopy;  Laterality: N/A;   COLONOSCOPY WITH PROPOFOL N/A 05/03/2017   Procedure: COLONOSCOPY WITH PROPOFOL;  Surgeon: Alvis Jourdain, MD;  Location: [facility];  Service: Endoscopy;  Laterality: N/A;   COLONOSCOPY WITH PROPOFOL N/A 06/29/2020   Procedure: COLONOSCOPY WITH PROPOFOL;  Surgeon: Nickey Barn,  Portia Brittle, MD;  Location: [facility];  Service: Endoscopy;  Laterality: N/A;   COLONOSCOPY WITH PROPOFOL N/A 01/05/2021   Procedure: COLONOSCOPY WITH PROPOFOL;  Surgeon: Alvis Jourdain, MD;  Location: [facility];  Service: Endoscopy;  Laterality: N/A;   COLONOSCOPY WITH PROPOFOL N/A 09/28/2021   Procedure: COLONOSCOPY WITH PROPOFOL;  Surgeon: Alvis Jourdain, MD;  Location: [facility];  Service: Endoscopy;  Laterality: N/A;   CORNEAL TRANSPLANT     Right   CORONARY ARTERY BYPASS GRAFT  01/01/2000   x 3   ENDARTERECTOMY Right 09/26/2021   Procedure: RIGHT CAROTID ENDARTERECTOMY;  Surgeon: Dannis Dy, MD;  Location: [facility] OR;  Service: Vascular;  Laterality: Right;   ENTEROSCOPY N/A 02/27/2018   Procedure: ENTEROSCOPY;  Surgeon: Alvis Jourdain, MD;  Location: [facility];  Service: Endoscopy;  Laterality: N/A;   ENTEROSCOPY N/A 07/18/2018   Procedure: ENTEROSCOPY;  Surgeon: Alvis Jourdain, MD;  Location: [facility];  Service: Endoscopy;  Laterality: N/A;   ENTEROSCOPY N/A 06/29/2020   Procedure:  ENTEROSCOPY;  Surgeon: Alvis Jourdain, MD;  Location: [facility];  Service: Endoscopy;  Laterality: N/A;   ENTEROSCOPY N/A 12/05/2021   Procedure: ENTEROSCOPY;  Surgeon: Alvis Jourdain, MD;  Location: [facility];  Service: Gastroenterology;  Laterality: N/A;   ENTEROSCOPY N/A 12/25/2021   Procedure: ENTEROSCOPY;  Surgeon: Alvis Jourdain, MD;  Location: [facility];  Service: Gastroenterology;  Laterality: N/A;  with endoscopic placement of video capsule   ESOPHAGOGASTRODUODENOSCOPY N/A 05/25/2016   Procedure: ESOPHAGOGASTRODUODENOSCOPY (EGD);  Surgeon: Alvis Jourdain, MD;  Location: [facility];  Service: Endoscopy;  Laterality: N/A;   ESOPHAGOGASTRODUODENOSCOPY N/A 08/01/2018   Procedure: ESOPHAGOGASTRODUODENOSCOPY (EGD);  Surgeon: Alvis Jourdain, MD;  Location: [facility];  Service: Endoscopy;  Laterality: N/A;   ESOPHAGOGASTRODUODENOSCOPY (EGD) WITH PROPOFOL N/A 09/28/2021   Procedure: ESOPHAGOGASTRODUODENOSCOPY (EGD) WITH PROPOFOL;  Surgeon: Alvis Jourdain, MD;  Location: [facility] ENDOSCOPY;  Service: Endoscopy;  Laterality: N/A;   EYE SURGERY Right 1995 or 1996   Laser surgery for retinal hemorrhage   GIVENS CAPSULE STUDY N/A 07/16/2018   Procedure: GIVENS CAPSULE STUDY;  Surgeon: Alvis Jourdain, MD;  Location: [facility];  Service: Endoscopy;  Laterality: N/A;   GIVENS CAPSULE STUDY N/A 12/14/2021   Procedure: GIVENS CAPSULE STUDY;  Surgeon: Alvis Jourdain, MD;  Location: [facility]COPY;  Service: Gastroenterology;  Laterality: N/A;   GIVENS CAPSULE STUDY N/A 12/25/2021   Procedure: GIVENS CAPSULE STUDY;  Surgeon: Alvis Jourdain, MD;  Location: [facility];  Service: Gastroenterology;  Laterality: N/A;   HEMOSTASIS CLIP PLACEMENT  06/29/2020   Procedure: HEMOSTASIS CLIP PLACEMENT;  Surgeon: Alvis Jourdain, MD;  Location: [facility];  Service: Endoscopy;;   HEMOSTASIS CLIP PLACEMENT  01/05/2021   Procedure: HEMOSTASIS CLIP PLACEMENT;  Surgeon: Alvis Jourdain, MD;  Location: [facility];  Service:  Endoscopy;;   HOT HEMOSTASIS N/A 02/27/2018   Procedure: HOT HEMOSTASIS (ARGON PLASMA COAGULATION/BICAP);  Surgeon: Alvis Jourdain, MD;  Location: [facility];  Service: Endoscopy;  Laterality: N/A;   HOT HEMOSTASIS N/A 08/01/2018   Procedure: HOT HEMOSTASIS (ARGON PLASMA COAGULATION/BICAP);  Surgeon: Alvis Jourdain, MD;  Location: [facility];  Service: Endoscopy;  Laterality: N/A;   HOT HEMOSTASIS N/A 06/29/2020   Procedure: HOT HEMOSTASIS (ARGON PLASMA COAGULATION/BICAP);  Surgeon: Alvis Jourdain, MD;  Location: [facility];  Service: Endoscopy;  Laterality: N/A;   HOT HEMOSTASIS N/A 01/05/2021   Procedure: HOT HEMOSTASIS (ARGON PLASMA COAGULATION/BICAP);  Surgeon: Alvis Jourdain, MD;  Location: [facility];  Service: Endoscopy;  Laterality: N/A;   HOT HEMOSTASIS N/A 12/05/2021   Procedure: HOT  HEMOSTASIS (ARGON PLASMA COAGULATION/BICAP);  Surgeon: Alvis Jourdain, MD;  Location: [facility];  Service: Gastroenterology;  Laterality: N/A;   IR RADIOLOGIST EVAL & MGMT  12/14/2016   IR RADIOLOGIST EVAL & MGMT  07/26/2021   IR RADIOLOGIST EVAL & MGMT  08/21/2023   LEFT HEART CATH AND CORS/GRAFTS ANGIOGRAPHY N/A 01/09/2018   Procedure: LEFT HEART CATH AND CORS/GRAFTS ANGIOGRAPHY;  Surgeon: Chapman Commodore, MD;  Location: [facility];  Service: Cardiovascular;  Laterality: N/A;   LEFT HEART CATHETERIZATION WITH CORONARY ANGIOGRAM N/A 08/03/2014   Procedure: LEFT HEART CATHETERIZATION WITH CORONARY ANGIOGRAM;  Surgeon: Darrold Emms, MD;  Location: [facility] CATH LAB;  Service: Cardiovascular;  Laterality: N/A;   PATCH ANGIOPLASTY Right 09/26/2021   Procedure: PATCH ANGIOPLASTY RIGHT CAROTID;  Surgeon: Dannis Dy, MD;  Location: [facility] OR;  Service: Vascular;  Laterality: Right;   Post Coronary Artery  BPG  01/05/2000   Right jugular sheath removed   PR VEIN BYPASS GRAFT,AORTO-FEM-POP     ROTATOR CUFF REPAIR     Right    REVIEW OF SYSTEMS:  Constitutional: positive for fatigue Eyes:  negative Ears, nose, mouth, throat, and face: negative Respiratory: positive for dyspnea on exertion Cardiovascular: negative Gastrointestinal: negative Genitourinary:negative Integument/breast: negative Hematologic/lymphatic: negative Musculoskeletal:negative Neurological: negative Behavioral/Psych: negative Endocrine: negative Allergic/Immunologic: negative   PHYSICAL EXAMINATION: General appearance: alert, cooperative, fatigued, and no distress Head: Normocephalic, without obvious abnormality, atraumatic Neck: no adenopathy, no JVD, supple, symmetrical, trachea midline, and thyroid not enlarged, symmetric, no tenderness/mass/nodules Lymph nodes: Cervical, supraclavicular, and axillary nodes normal. Resp: clear to auscultation bilaterally Back: symmetric, no curvature. ROM normal. No CVA tenderness. Cardio: regular rate and rhythm, S1, S2 normal, no murmur, click, rub or gallop GI: soft, non-tender; bowel sounds normal; no masses,  no organomegaly Extremities: extremities normal, atraumatic, no cyanosis or edema Neurologic: Alert and oriented X 3, normal strength and tone. Normal symmetric reflexes. Normal coordination and gait  ECOG PERFORMANCE STATUS: 1 - Symptomatic but completely ambulatory  Blood pressure (!) 160/63, pulse 65, temperature 97.9 F (36.6 C), temperature source Temporal, resp. rate 18, height 5\' 2"  (1.575 m), weight 140 lb 8 oz (63.7 kg), SpO2 100%.  LABORATORY DATA: Lab Results  Component Value Date   WBC 5.9 11/27/2023   HGB 9.0 (L) 11/27/2023   HCT 27.8 (L) 11/27/2023   MCV 93.6 11/27/2023   PLT 303 11/27/2023      Chemistry      Component Value Date/Time   NA 141 11/19/2023 1913   NA 139 08/02/2017 0821   K 4.3 11/19/2023 1913   K 4.3 08/02/2017 0821   CL 108 11/19/2023 1913   CO2 24 11/19/2023 1913   CO2 24 08/02/2017 0821   BUN 31 (H) 11/19/2023 1913   BUN 14.4 08/02/2017 0821   CREATININE 1.71 (H) 11/19/2023 1913   CREATININE 1.96 (H)  05/23/2023 1510   CREATININE 1.3 (H) 08/02/2017 0821      Component Value Date/Time   CALCIUM 9.3 11/19/2023 1913   CALCIUM 9.2 08/02/2017 0821   ALKPHOS 58 09/02/2023 1313   ALKPHOS 66 08/02/2017 0821   AST 14 (L) 09/02/2023 1313   AST 14 (L) 05/23/2023 1510   AST 14 08/02/2017 0821   ALT 10 09/02/2023 1313   ALT 7 05/23/2023 1510   ALT 8 08/02/2017 0821   BILITOT 0.3 09/02/2023 1313   BILITOT 0.3 05/23/2023 1510   BILITOT 0.34 08/02/2017 0821       RADIOGRAPHIC STUDIES: CT Angio Chest/Abd/Pel for  Dissection W and/or Wo Contrast Result Date: 11/20/2023 CLINICAL DATA:  Acute aortic syndrome (AAS) suspected ith an extensive cardiac history including CAD, status post three-vessel CABG, heart failure, carotid stenosis, CKD and COPD who presents to the ED for hypertension. Hypertension with severe headache onset EXAM: CT ANGIOGRAPHY CHEST, ABDOMEN AND PELVIS TECHNIQUE: Non-contrast CT of the chest was initially obtained. Multidetector CT imaging through the chest, abdomen and pelvis was performed using the standard protocol during bolus administration of intravenous contrast. Multiplanar reconstructed images and MIPs were obtained and reviewed to evaluate the vascular anatomy. RADIATION DOSE REDUCTION: This exam was performed according to the departmental dose-optimization program which includes automated exposure control, adjustment of the mA and/or kV according to patient size and/or use of iterative reconstruction technique. CONTRAST:  OMNIPAQUE IOHEXOL 350 MG/ML SOLN COMPARISON:  CT chest 05/23/2023, CT chest 08/22/2021, CT chest 07/14/2016, CT chest 08/22/2021 FINDINGS: CTA CHEST FINDINGS Cardiovascular: Satisfactory opacification of the pulmonary arteries to the segmental level. No evidence of pulmonary embolism. The main pulmonary artery is normal in caliber. Normal heart size. No significant pericardial effusion. The thoracic aorta is normal in caliber. No dissection. Severe  atherosclerotic plaque of the thoracic aorta. Four-vessel coronary artery calcifications. Status post left anterior descending coronary artery stent. Status post coronary artery bypass graft. Mediastinum/Nodes: No enlarged mediastinal, hilar, or axillary lymph nodes. Thyroid gland, trachea, and esophagus demonstrate no significant findings. Lungs/Pleura: Biapical pleural/pulmonary scarring. Benign-appearing cystic changes of the left lower lobe. Diffuse bronchial wall thickening. No new focal consolidation. Stable chronic centrally calcified 1.5 cm pulmonary nodule along the minor fissure (6:72). Interval increase of a spiculated right lower lobe 3.5 x 2.1 cm (from 2.8 x 1.9 cm) pulmonary mass. Associated pleural tethering and mild pleural thickening (10:72). No pleural effusion. No pneumothorax. Musculoskeletal: No chest wall abnormality. No suspicious lytic or blastic osseous lesions. Redemonstration of right posterior old nonunionized healed 7 and 8 rib fractures along the noted pleural thickening-question pathologic fracture. Multilevel degenerative changes of the spine. Review of the MIP images confirms the above findings. CTA ABDOMEN AND PELVIS FINDINGS VASCULAR Aorta: Severe calcified and noncalcified atherosclerotic plaque. Normal caliber aorta without aneurysm, dissection, vasculitis or significant stenosis. Celiac: Severe narrowing of the origin of the celiac artery due to atherosclerotic plaque. Patent without evidence of aneurysm, dissection, vasculitis. SMA: Moderate narrowing of the origin of the superior mesenteric artery due to atherosclerotic plaque. Patent without evidence of aneurysm, dissection, vasculitis. Renals: Both renal arteries are patent without evidence of aneurysm, dissection, vasculitis, fibromuscular dysplasia or significant stenosis. IMA: Not definitely identified and possibly chronically occluded. Inflow: Moderate calcified and noncalcified atherosclerotic plaque. Patent bilateral  common iliac artery stent. Patent without evidence of aneurysm, dissection, vasculitis or significant stenosis. Veins: No obvious venous abnormality within the limitations of this arterial phase study. Review of the MIP images confirms the above findings. NON-VASCULAR Hepatobiliary: No focal liver abnormality. Status post cholecystectomy. No biliary dilatation. Pancreas: No focal lesion. Normal pancreatic contour. No surrounding inflammatory changes. No main pancreatic ductal dilatation. Spleen: Normal in size without focal abnormality. Adrenals/Urinary Tract: No adrenal nodule bilaterally. Bilateral kidneys enhance symmetrically.  Renal cortical scarring. No hydronephrosis. No hydroureter.  No nephroureterolithiasis. The urinary bladder is unremarkable. Stomach/Bowel: Stomach is within normal limits. No evidence of bowel wall thickening or dilatation. Colonic diverticulosis. Hypertrophic ileocecal valve. The appendix is not definitely identified with no inflammatory changes in the right lower quadrant to suggest acute appendicitis. Lymphatic: No lymphadenopathy Reproductive: Status post hysterectomy. No adnexal masses. Other: No  intraperitoneal free fluid. No intraperitoneal free gas. No organized fluid collection. Musculoskeletal: No abdominal wall hernia or abnormality. No suspicious lytic or blastic osseous lesions. No acute displaced fracture. Review of the MIP images confirms the above findings. IMPRESSION: 1. Interval increase of a spiculated right lower lobe 3.5 x 2.1 cm (from 2.8 x 1.9 cm close) pulmonary mass. Finding concerning for malignancy. Associated pleural tethering and mild pleural thickening. Additional imaging evaluation or consultation with Pulmonology or Thoracic Surgery recommended. 2. No thoracic and abdominal aorta aneurysm or dissection. 3. No acute nonvascular intra-abdominal or intrapelvic abnormality. 4. Aortic Atherosclerosis (ICD10-I70.0) -severe, including four-vessel coronary  calcifications status post coronary bypass graft and left anterior descending coronary artery stent. 5. Severe calcified and noncalcified atherosclerotic plaque. Severe narrowing of the origin of the celiac artery due to atherosclerotic plaque. Moderate narrowing of the origin of the superior mesenteric artery due to atherosclerotic plaque. Nonvisualization of the inferior mesenteric artery- possibly chronically occluded. Electronically Signed   By: Morgane  Naveau M.D.   On: 11/20/2023 03:44   CT Head Wo Contrast Result Date: 11/20/2023 CLINICAL DATA:  Headache, sudden, severe ?IPH, HTN w HA EXAM: CT HEAD WITHOUT CONTRAST TECHNIQUE: Contiguous axial images were obtained from the base of the skull through the vertex without intravenous contrast. RADIATION DOSE REDUCTION: This exam was performed according to the departmental dose-optimization program which includes automated exposure control, adjustment of the mA and/or kV according to patient size and/or use of iterative reconstruction technique. COMPARISON:  CT head 05/08/2023 FINDINGS: Brain: No evidence of large-territorial acute infarction. No parenchymal hemorrhage. No mass lesion. No extra-axial collection. No mass effect or midline shift. No hydrocephalus. Basilar cisterns are patent. Vascular: No hyperdense vessel. Atherosclerotic calcifications are present within the cavernous internal carotid arteries. Skull: No acute fracture or focal lesion. Prior suboccipital craniotomy. Sinuses/Orbits: Mild mucosal thickening of bilateral ethmoid and maxillary sinuses. Paranasal sinuses. Sclerosis of the bilateral mastoid air cell. No fluid within the middle ears. Right lens replacement. Otherwise the orbits are unremarkable. Other: None. IMPRESSION: No acute intracranial abnormality. Electronically Signed   By: Morgane  Naveau M.D.   On: 11/20/2023 03:24     ASSESSMENT AND PLAN:  This is a very pleasant 83 years old white female with a stage IA non-small cell  lung cancer, squamous cell carcinoma presented with right lower lobe pulmonary nodule status post stereotactic radiotherapy.  She is currently on observation and she is feeling fine except for the baseline fatigue and shortness of breath. She had repeat CT scan of the chest, abdomen pelvis performed recently.  I personally independently reviewed the scan images and discussed the result and showed the images to the patient and her daughter.  She has enlargement of the right lower lobe lung mass concerning for disease progression. Assessment and Plan    Stage IA non-small cell lung cancer, squamous cell carcinoma Stage IA non-small cell lung cancer, squamous cell carcinoma, diagnosed in August 2017, post stereotactic radiotherapy to the right lower lobe. Recent CT scan shows enlargement in the right lower lobe, suspicious for cancer progression. Differential includes active cancer versus inflammation. A PET scan is planned to assess the activity of the lesion and confirm if it is cancerous or inflammatory. - Order PET scan to assess activity of the lesion in the right lower lobe - Review PET scan results in three weeks to determine further management  Anemia of chronic disease with iron deficiency Persistent anemia consistent with anemia of chronic disease and iron deficiency. Hemoglobin improved  to 9 from 8.5 after previous Feraheme infusion. Awaiting iron studies to determine need for further iron infusion. Symptoms include weakness, fatigue, and clamminess, likely due to anemia. - Await iron study results - Arrange iron infusion if iron studies indicate deficiency  Hypertension Blood pressure recorded at 160/63. She reports recent medication intake and previous consultation with Dr. Glena Landau, who deemed it acceptable. Advised to monitor blood pressure at home. - Monitor blood pressure at home - Discuss blood pressure management with Dr. Glena Landau  She was advised to call immediately if she has any  other concerning symptoms in the interval. All questions were answered. The patient knows to call the clinic with any problems, questions or concerns. We can certainly see the patient much sooner if necessary.  Disclaimer: This note was dictated with voice recognition software. Similar sounding words can inadvertently be transcribed and may not be corrected upon review.

## 2023-12-02 ENCOUNTER — Ambulatory Visit: Payer: 59 | Admitting: Neurology

## 2023-12-03 ENCOUNTER — Encounter: Payer: Self-pay | Admitting: Dermatology

## 2023-12-03 ENCOUNTER — Ambulatory Visit (INDEPENDENT_AMBULATORY_CARE_PROVIDER_SITE_OTHER): Payer: 59 | Admitting: Dermatology

## 2023-12-03 VITALS — BP 150/55 | HR 68

## 2023-12-03 DIAGNOSIS — L820 Inflamed seborrheic keratosis: Secondary | ICD-10-CM

## 2023-12-03 DIAGNOSIS — L570 Actinic keratosis: Secondary | ICD-10-CM | POA: Diagnosis not present

## 2023-12-03 DIAGNOSIS — W908XXD Exposure to other nonionizing radiation, subsequent encounter: Secondary | ICD-10-CM | POA: Diagnosis not present

## 2023-12-03 NOTE — Patient Instructions (Signed)
 Important Information  Due to recent changes in healthcare laws, you may see results of your pathology and/or laboratory studies on MyChart before the doctors have had a chance to review them. We understand that in some cases there may be results that are confusing or concerning to you. Please understand that not all results are received at the same time and often the doctors may need to interpret multiple results in order to provide you with the best plan of care or course of treatment. Therefore, we ask that you please give [place] 2 business days to thoroughly review all your results before contacting the office for clarification. Should we see a critical lab result, you will be contacted sooner.   If You Need Anything After Your Visit  If you have any questions or concerns for your doctor, please call our main line at [phone] If no one answers, please leave a voicemail as directed and we will return your call as soon as possible. Messages left after 4 pm will be answered the following business day.   You may also send [place] a message via MyChart. We typically respond to MyChart messages within 1-2 business days.  For prescription refills, please ask your pharmacy to contact our office. Our fax number is [phone].  If you have an urgent issue when the clinic is closed that cannot wait until the next business day, you can page your doctor at the number below.    Please note that while we do our best to be available for urgent issues outside of office hours, we are not available 24/7.   If you have an urgent issue and are unable to reach [place], you may choose to seek medical care at your doctor's office, retail clinic, urgent care center, or emergency room.  If you have a medical emergency, please immediately call 911 or go to the emergency department. In the event of inclement weather, please call our main line at [phone] for an update on the status of any delays or  closures.  Dermatology Medication Tips: Please keep the boxes that topical medications come in in order to help keep track of the instructions about where and how to use these. Pharmacies typically print the medication instructions only on the boxes and not directly on the medication tubes.   If your medication is too expensive, please contact our office at [phone] or send [place] a message through MyChart.   We are unable to tell what your co-pay for medications will be in advance as this is different depending on your insurance coverage. However, we may be able to find a substitute medication at lower cost or fill out paperwork to get insurance to cover a needed medication.   If a prior authorization is required to get your medication covered by your insurance company, please allow [place] 1-2 business days to complete this process.  Drug prices often vary depending on where the prescription is filled and some pharmacies may offer cheaper prices.  The website www.goodrx.com contains coupons for medications through different pharmacies. The prices here do not account for what the cost may be with help from insurance (it may be cheaper with your insurance), but the website can give you the price if you did not use any insurance.  - You can print the associated coupon and take it with your prescription to the pharmacy.  - You may also stop by our office during regular business hours and pick up a GoodRx coupon card.  - If  you need your prescription sent electronically to a different pharmacy, notify our office through [facility] or by phone at [phone]    Skin Education :   I counseled the patient regarding the following: Sun screen (SPF 30 or greater) should be applied during peak UV exposure (between 10am and 2pm) and reapplied after exercise or swimming.  The ABCDEs of melanoma were reviewed with the patient, and the importance of monthly self-examination of moles was emphasized.  Should any moles change in shape or color, or itch, bleed or burn, pt will contact our office for evaluation sooner then their interval appointment.  Plan: Sunscreen Recommendations I recommended a broad spectrum sunscreen with a SPF of 30 or higher. I explained that SPF 30 sunscreens block approximately 97 percent of the sun's harmful rays. Sunscreens should be applied at least 15 minutes prior to expected sun exposure and then every 2 hours after that as long as sun exposure continues. If swimming or exercising sunscreen should be reapplied every 45 minutes to an hour after getting wet or sweating. One ounce, or the equivalent of a shot glass full of sunscreen, is adequate to protect the skin not covered by a bathing suit. I also recommended a lip balm with a sunscreen as well. Sun protective clothing can be used in lieu of sunscreen but must be worn the entire time you are exposed to the sun's rays. Liquid nitrogen was applied for 10-12 seconds to the skin lesion and the expected blistering or scabbing reaction explained. Do not pick at the area. Patient reminded to expect hypopigmented scars from the procedure. Return if lesion fails to fully resolve.

## 2023-12-03 NOTE — Progress Notes (Signed)
   Follow-Up Visit   Subjective  [name] is a 83 y.o. female who presents for the following: Patient here to follow up on Aks after treatment with Efudex .  The patient has spots, moles and lesions to be evaluated, some may be new or changing.  The following portions of the chart were reviewed this encounter and updated as appropriate: medications, allergies, medical history  Review of Systems:  No other skin or systemic complaints except as noted in HPI or Assessment and Plan.  Objective  Well appearing patient in no apparent distress; mood and affect are within normal limits.  A full examination was performed including  A focused examination was performed of the following areas: Face  Relevant exam findings are noted in the Assessment and Plan.  Head - Anterior (Face) Inflamed brown black stuck on papule  Assessment & Plan   ACTINIC KERATOSIS Exam: Erythematous thin papules/macules with gritty scale at the forehead.  Actinic keratoses are precancerous spots that appear secondary to cumulative UV radiation exposure/sun exposure over time. They are chronic with expected duration over 1 year. A portion of actinic keratoses will progress to squamous cell carcinoma of the skin. It is not possible to reliably predict which spots will progress to skin cancer and so treatment is recommended to prevent development of skin cancer.  Recommend daily broad spectrum sunscreen SPF 30+ to sun-exposed areas, reapply every 2 hours as needed.  Recommend staying in the shade or wearing long sleeves, sun glasses (UVA+UVB protection) and wide brim hats (4-inch brim around the entire circumference of the hat). Call for new or changing lesions.  Treatment Plan: Patient previously treated with Efudex  for 28 days. Patient at treatment goal. No further treatment needed at this time.   INFLAMED SEBORRHEIC KERATOSIS Exam: Erythematous keratotic or waxy stuck-on papule or plaque.  Symptomatic,  irritating, patient would like treated.  Benign-appearing.  Call clinic for new or changing lesions.   Prior to procedure, discussed risks of blister formation, small wound, skin dyspigmentation, or rare scar following treatment. Recommend Vaseline ointment to treated areas while healing.  Destruction Procedure Note Destruction method: cryotherapy   Informed consent: discussed and consent obtained   Lesion destroyed using liquid nitrogen: Yes   Outcome: patient tolerated procedure well with no complications   Post-procedure details: wound care instructions given   Locations: mid forehead # of Lesions Treated: 1   AK (ACTINIC KERATOSIS)   INFLAMED SEBORRHEIC KERATOSIS Head - Anterior (Face) Destruction of lesion - Head - Anterior (Face) Complexity: simple   Destruction method: cryotherapy   Informed consent: discussed and consent obtained   Timeout:  patient name, date of birth, surgical site, and procedure verified Outcome: patient tolerated procedure well with no complications   Post-procedure details: wound care instructions given    Return if symptoms worsen or fail to improve.  I, Haig Levan, Surg Tech III, am acting as scribe for Deneise Finlay, MD.   Documentation: I have reviewed the above documentation for accuracy and completeness, and I agree with the above.  Deneise Finlay, MD

## 2023-12-09 ENCOUNTER — Encounter (HOSPITAL_COMMUNITY)
Admission: RE | Admit: 2023-12-09 | Discharge: 2023-12-09 | Disposition: A | Discharge status: Home / Self Care | Source: Ambulatory Visit | Attending: Internal Medicine | Admitting: Internal Medicine

## 2023-12-09 ENCOUNTER — Encounter (HOSPITAL_COMMUNITY): Payer: Self-pay

## 2023-12-09 DIAGNOSIS — C3490 Malignant neoplasm of unspecified part of unspecified bronchus or lung: Secondary | ICD-10-CM | POA: Insufficient documentation

## 2023-12-10 ENCOUNTER — Encounter (HOSPITAL_COMMUNITY)
Admission: RE | Admit: 2023-12-10 | Discharge: 2023-12-10 | Disposition: A | Discharge status: Home / Self Care | Source: Ambulatory Visit | Attending: Internal Medicine | Admitting: Internal Medicine

## 2023-12-10 DIAGNOSIS — C3490 Malignant neoplasm of unspecified part of unspecified bronchus or lung: Secondary | ICD-10-CM | POA: Diagnosis present

## 2023-12-10 LAB — GLUCOSE, CAPILLARY: Glucose-Capillary: 110 mg/dL — ABNORMAL HIGH (ref 70–99)

## 2023-12-10 MED ORDER — FLUDEOXYGLUCOSE F - 18 (FDG) INJECTION
6.9000 | Freq: Once | INTRAVENOUS | Status: AC
  Administered 2023-12-10: 6.88 via INTRAVENOUS

## 2023-12-16 ENCOUNTER — Ambulatory Visit: Payer: Self-pay | Admitting: Emergency Medicine

## 2023-12-16 NOTE — Telephone Encounter (Signed)
 If there are openings w any provider for acute OV then would schedule her to be seen.

## 2023-12-16 NOTE — Telephone Encounter (Signed)
 Copied from CRM [id number]. Topic: Clinical - Red Word Triage >> Dec 16, 2023  2:34 PM Hilton Lucky wrote: Red Word that prompted transfer to Nurse Triage: States oxygen  when up and moving without significant exertion (making a sandwich) is 84% - at rest it is 95-98%.  TRIAGE SUMMARY NOTE: Pt reporting that she has been having worsening SOB for the past month, having lower oxygen  levels lately, was making a sandwich earlier today and pulse ox went down to 84%. Pt reporting that she doesn't feel more SOB at rest unless just exerted herself, "feel fine" if not moving around. Pt took her pulse ox for nurse a couple times during phone call, first reading was 90% with HR 56, second reading was within 5 min and was 97% but HR 49. Pt reporting that she'll feel like she's going to pass out when walking around store but no current lightheadedness. Pt reporting coughing fits at times, coughing up white thick sputum, "just real hard to breathe, and very fatigued. Advised pt be examined in hospital, pt refusing hospital "just wanna see a doctor" at pulm office, requesting pulm appt. Advised that sending HP message to pulm office for call back to pt for further recommendations and/or appt options. Advised use rescue inhaler, call 911 for any worsening. Pt verbalized understanding. Please advise.  E2C2 Pulmonary Triage - Initial Assessment Questions "Chief Complaint (e.g., cough, sob, wheezing, fever, chills, sweat or additional symptoms) *Go to specific symptom protocol after initial questions. Was making sandwich and pulse ox went down to 84% Just get real tired exhausted, just have to sit down to relax my lungs When relaxing, goes back up to 90% Stay SOB regardless because heart and my lung cancer No more trouble than normal at rest unless just exerted herself No chest pain, dizziness, weakness If sitting or laying down then feel fine Cough, thick white sputum, coughing fits at times Just real hard to  breathe Lightheadedness feel like going to pass out when walking around in store Just wanna see a doc Don't do anything at hospital  "How long have symptoms been present?" Almost a month now  MEDICINES:   "Have you used any OTC meds to help with symptoms?" No  "Have you used your inhalers/maintenance medication?" Yes If yes, "What medications?" Trelegy once a day, rescue inhaler  If inhaler, ask "How many puffs and how often?" Note: Review instructions on medication in the chart. Haven't used rescue inhaler  OXYGEN : "Do you wear supplemental oxygen ?" No  "Do you monitor your oxygen  levels?" Yes If yes, "What is your reading (oxygen  level) today?" Earlier got to 84%, 90% right now HR 56, 97% now HR 49  "What is your usual oxygen  saturation reading?"  (Note: Pulmonary O2 sats should be 90% or greater) 95-98%  Reason for Disposition  Difficulty breathing  Answer Assessment - Initial Assessment Questions 9. OTHER SYMPTOMS: "Do you have any other symptoms?" (e.g., fever, change in sputum)     no  Answer Assessment - Initial Assessment Questions 6. HEART RATE: "Can you tell me your heart rate?" "How many beats in 15 seconds?"  (Note: not all patients can do this)       56 bpm then within 5 min down to 49 bpm 9. CARDIAC HISTORY: "Do you have any history of heart disease?" (e.g., heart attack, angina, bypass surgery, angioplasty, arrhythmia)      significant  Protocols used: COPD Oxygen  Monitoring and Hypoxia-A-AH, Heart Rate and Heartbeat Questions-A-AH

## 2023-12-16 NOTE — Telephone Encounter (Signed)
 Please advise if you would like pt to be seen sooner in acute spot. Pt scheduled 6/11 as it is soonest 30 min slot.

## 2023-12-17 NOTE — Telephone Encounter (Signed)
 Spoke with [name] per DPR. Pt is scheduled for acute ov 5/7. Pt is aware. NFN

## 2023-12-17 NOTE — Telephone Encounter (Signed)
 Please advise front staff if any avail acute spots

## 2023-12-18 ENCOUNTER — Ambulatory Visit (INDEPENDENT_AMBULATORY_CARE_PROVIDER_SITE_OTHER): Admitting: Pulmonary Disease

## 2023-12-18 ENCOUNTER — Encounter: Payer: Self-pay | Admitting: Pulmonary Disease

## 2023-12-18 VITALS — BP 132/60 | HR 68 | Ht 64.0 in | Wt 140.6 lb

## 2023-12-18 DIAGNOSIS — J9611 Chronic respiratory failure with hypoxia: Secondary | ICD-10-CM | POA: Diagnosis not present

## 2023-12-18 DIAGNOSIS — J449 Chronic obstructive pulmonary disease, unspecified: Secondary | ICD-10-CM | POA: Diagnosis not present

## 2023-12-18 NOTE — Progress Notes (Signed)
 Synopsis: hx of COPD and lung cancer  Subjective:   PATIENT ID: [name] GENDER: female DOB: [birth year], MRN: [id number]   HPI  Chief Complaint  Patient presents with   Acute Visit    Pt states low 02/ light headed would like o2   Summar [name] is an 83 year old woman, former smoker with history of COPD, CAD, NSCLC of the RLL s/p radiation and hypertension who returns to pulmonary clinic for low oxygen  saturations.   She experiences significant drops in oxygen  saturation, reaching as low as 75%, particularly during standing or physical activities. This has persisted for months, leading to previous hospital visits. She experiences cold sweats during these episodes but denies chest pain. She has not used home oxygen  previously.   She had NM PET scan 12/10/23 results reviewed with patient, showing nodular right lower lobe parenhcymal opacity that is similar to prior scans with low level uptake. Stable adjacent posterolateral chest wall pleural thickening calcifications with low level uptake. Focal uptake along the anorectal region.   She denies cough, wheezing or sputum production.  PFTs show moderate obstruction and mild diffusion capacity defect.   Echo 2023 shows LV EF 60-65%. Grade I diastolic dysfunction. Normal RV size and function. LA is mildly dilated.  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   Diverticulitis    Dyspnea    with exertion   Gastroesophageal reflux disease    occ   Glaucoma    right eye   Headache syndrome 11/27/2018   Hiatal hernia    History of  shingles 06/23/2018   Hyperlipidemia    Hypertension    Lung cancer (HCC) dx 2018   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   Myocardial infarction (HCC) 01/01/2000   Cardiac catheterization   Peripheral vascular disease (HCC)    stents in legs x 2 or 3   Pneumonia    last time winter 2017 -2018   PONV (postoperative nausea and vomiting)    occassionally, last colonscopy did ok with anesthesia   Primary cancer of right lower lobe of lung (HCC) 04/25/2016   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   TIA (transient ischemic attack) 02/11/2019   Wears dentures    Full set   Wears glasses      Family History  Problem Relation Age of Onset   Heart disease Mother        Heart Disease before age 81   Hypertension Mother    Hyperlipidemia Mother    Heart attack Mother    Clotting disorder Mother    Heart disease Father        Heart Disease before age 2   Heart attack Father    Hyperlipidemia Father    Hypertension Father    Heart disease Brother        Heart Disease before age 35   Hyperlipidemia Brother    Hypertension Brother    Clotting disorder Brother    AAA (abdominal aortic aneurysm) Brother    Cerebral aneurysm Sister  Hypertension Sister    AAA (abdominal aortic aneurysm) Sister    Asthma Sister    Cerebral aneurysm Brother    Cancer Brother        Lung   Hypertension Brother    Heart attack Brother    Heart disease Brother        Aneurysm of Brain   Hypertension Brother    Heart disease Brother    Heart disease Brother    Stroke Son        Aneurysm of Stomach   AAA (abdominal aortic aneurysm) Son    Cancer Maternal Uncle        great uncle/cancer/type unknown     Social History   Socioeconomic History   Marital status: Widowed    Spouse name: Not on file   Number of children: 4   Years of education: 7TH   Highest education level: Not on file  Occupational History   Not on file  Tobacco Use   Smoking status: Former     Current packs/day: 0.00    Average packs/day: 1.5 packs/day for 30.0 years (45.0 ttl pk-yrs)    Types: Cigarettes    Start date: 08/13/1970    Quit date: 08/13/2000    Years since quitting: 23.3    Passive exposure: Never   Smokeless tobacco: Never  Vaping Use   Vaping status: Never Used  Substance and Sexual Activity   Alcohol use: No    Alcohol/week: 0.0 standard drinks of alcohol   Drug use: No   Sexual activity: Never  Other Topics Concern   Not on file  Social History Narrative   Lives at home w/ her son   Right-handed   Drinks coffee and [employer] daily   Social Drivers of Health   Financial Resource Strain: Low Risk  (11/20/2023)   Received from [facility]   Overall Financial Resource Strain (CARDIA)    Difficulty of Paying Living Expenses: Not hard at all  Food Insecurity: No Food Insecurity (11/20/2023)   Received from [facility]   Hunger Vital Sign    Worried About Running Out of Food in the Last Year: Never true    Ran Out of Food in the Last Year: Never true  Transportation Needs: No Transportation Needs (11/20/2023)   Received from [facility] - Transportation    Lack of Transportation (Medical): No    Lack of Transportation (Non-Medical): No  Physical Activity: Unknown (11/20/2023)   Received from [facility]   Exercise Vital Sign    Days of Exercise per Week: 0 days    Minutes of Exercise per Session: Not on file  Stress: Patient Declined (11/20/2023)   Received from [facility] - Occupational Stress Questionnaire    Feeling of Stress : Patient declined  Social Connections: Socially Isolated (11/20/2023)   Received from [facility]   Social Network    How would you rate your social network (family, work, friends)?: Little participation, lonely and socially isolated  Intimate Partner Violence: Not At Risk (11/20/2023)   Received from [facility]   HITS    Over the last 12 months how often did your  partner physically hurt you?: Never    Over the last 12 months how often did your partner insult you or talk down to you?: Never    Over the last 12 months how often did your partner threaten you with physical harm?: Never    Over the  last 12 months how often did your partner scream or curse at you?: Never     Allergies  Allergen Reactions   Avelox [Moxifloxacin Hcl In Nacl] Palpitations and Other (See Comments)    Caused Heart Attack    Azithromycin  Swelling and Other (See Comments)    Patient reported past history of lip swelling   Codeine Other (See Comments)    Dr. Glena Landau advised patient not to take this medication   Doxycycline  Swelling and Other (See Comments)    Mouth, lips, feet swelling   Hydromorphone  Palpitations and Other (See Comments)    DILAUDID   -  Pt had a Heart Attack after taking Dilaudid .   Levaquin  [Levofloxacin ] Shortness Of Breath and Other (See Comments)    Chest pressure, SOB, "pain in between shoulder blades", sweaty -as reported by patient per experience in ED this afternoon   Vitamin D Analogs Swelling and Other (See Comments)    Face and lips swell, but no breathing issues   Nifedipine Er Other (See Comments)    Dropped the heart rate too low   Octreotide      Chest pain, N/V   Zetia [Ezetimibe] Other (See Comments)    "Made me feel like my face was going to sleep/numb"   Oxycodone-Acetaminophen  Other (See Comments)    Says it makes her "feel weird"   Risedronate Other (See Comments)    Chest pain     Outpatient Medications Prior to Visit  Medication Sig Dispense Refill   acetaminophen  (TYLENOL ) 500 MG tablet Take 500-1,000 mg by mouth every 6 (six) hours as needed (for pain).     albuterol  (VENTOLIN  HFA) 108 (90 Base) MCG/ACT inhaler Inhale 2 puffs into the lungs every 6 (six) hours as needed for wheezing or shortness of breath. 8 g 6   atorvastatin  (LIPITOR) 40 MG tablet Take 1 tablet (40 mg total) by mouth at bedtime. (Patient taking differently:  Take 20 mg by mouth at bedtime.) 30 tablet 6   cetirizine  (ZYRTEC ) 10 MG tablet Take 10 mg by mouth daily as needed for allergies.      clopidogrel  (PLAVIX ) 75 MG tablet Take 0.5 tablets (37.5 mg total) by mouth in the morning.     dexlansoprazole  (DEXILANT ) 60 MG capsule Take 60 mg by mouth daily before breakfast.     furosemide  (LASIX ) 40 MG tablet Take 20 mg by mouth in the morning.     magnesium  oxide (MAG-OX) 400 (240 Mg) MG tablet Take 400 mg by mouth daily after breakfast.     nitroGLYCERIN  (NITRODUR - DOSED IN MG/24 HR) 0.2 mg/hr patch Place 0.2 mg onto the skin daily. Patient wears patch for 12 hours and removes     nitroGLYCERIN  (NITROSTAT ) 0.4 MG SL tablet Place 1 tablet (0.4 mg total) under the tongue every 5 (five) minutes x 3 doses as needed for chest pain. 10 tablet 0   olmesartan  (BENICAR ) 20 MG tablet Take 1 tablet (20 mg total) by mouth daily. (Patient taking differently: Take 10 mg by mouth in the morning.) 90 tablet 1   Rimegepant Sulfate (NURTEC) 75 MG TBDP Take 1 tablet (75 mg total) by mouth as needed. 8 tablet 6   TRELEGY ELLIPTA  200-62.5-25 MCG/ACT AEPB USE 1 INHALATION BY MOUTH DAILY (Patient taking differently: Inhale 1 puff into the lungs daily.) 180 each 3   amiodarone  (PACERONE ) 200 MG tablet Take 100 mg by mouth daily. (Patient not taking: Reported on 11/20/2023)     amLODipine  (NORVASC ) 2.5 MG tablet Take  2.5 mg by mouth daily. (Patient not taking: Reported on 11/20/2023)     predniSONE  (DELTASONE ) 10 MG tablet Take 10 mg by mouth daily with breakfast. (Patient not taking: Reported on 11/20/2023)     No facility-administered medications prior to visit.    Review of Systems  Constitutional:  Negative for chills, fever, malaise/fatigue and weight loss.  HENT:  Negative for congestion, sinus pain and sore throat.   Eyes: Negative.   Respiratory:  Positive for shortness of breath. Negative for cough, hemoptysis, sputum production and wheezing.   Cardiovascular:  Negative  for chest pain, palpitations, orthopnea, claudication and leg swelling.  Gastrointestinal:  Negative for abdominal pain, heartburn, nausea and vomiting.  Genitourinary: Negative.   Musculoskeletal:  Negative for joint pain and myalgias.  Skin:  Negative for rash.  Neurological:  Negative for weakness.  Endo/Heme/Allergies: Negative.   Psychiatric/Behavioral: Negative.        Objective:   Vitals:   12/18/23 1648  BP: 132/60  Pulse: 68  SpO2: 96%  Weight: 140 lb 9.6 oz (63.8 kg)  Height: 5\' 4"  (1.626 m)   Ambulatory Pulse Oximetry  Row Name 12/18/23 1706      Resting  Resting Heart Rate 68      Resting Sp02 100      Lap 1 (250 feet)  HR 70      02 Sat 86      Lap 3 (250 feet)  Tech Comments: Patient could not walk full lap w/o O2 dropping. dropped to 86% was placed on 2L of O2  went back up to 98%        Physical Exam Constitutional:      General: She is not in acute distress.    Appearance: Normal appearance.  Eyes:     General: No scleral icterus.    Conjunctiva/sclera: Conjunctivae normal.  Cardiovascular:     Rate and Rhythm: Normal rate and regular rhythm.  Pulmonary:     Breath sounds: Decreased breath sounds present. No wheezing, rhonchi or rales.  Musculoskeletal:     Right lower leg: No edema.     Left lower leg: No edema.  Skin:    General: Skin is warm and dry.  Neurological:     General: No focal deficit present.     CBC    Component Value Date/Time   WBC 5.9 11/27/2023 0857   WBC 6.3 11/19/2023 1913   RBC 2.97 (L) 11/27/2023 0857   HGB 9.0 (L) 11/27/2023 0857   HGB 9.9 (L) 08/02/2017 0821   HCT 27.8 (L) 11/27/2023 0857   HCT 29.8 (L) 08/02/2017 0821   PLT 303 11/27/2023 0857   PLT 218 08/02/2017 0821   MCV 93.6 11/27/2023 0857   MCV 92.3 08/02/2017 0821   MCH 30.3 11/27/2023 0857   MCHC 32.4 11/27/2023 0857   RDW 12.9 11/27/2023 0857   RDW 14.4 08/02/2017 0821   LYMPHSABS 1.8 11/27/2023 0857   LYMPHSABS 2.6 08/02/2017 0821    MONOABS 0.6 11/27/2023 0857   MONOABS 0.5 08/02/2017 0821   EOSABS 0.4 11/27/2023 0857   EOSABS 0.2 08/02/2017 0821   BASOSABS 0.1 11/27/2023 0857   BASOSABS 0.0 08/02/2017 0821     Chest imaging: NM Pet Scan 12/10/23 Nodular right lower lobe parenchymal opacity is similar to the older CT scans with only low level uptake. This could be posttreatment related. Continued follow-up CT surveillance.   Stable distortion of the adjacent posterolateral chest wall with pleural thickening calcifications with low-level uptake  as well.   Focal uptake along the anorectal region. This was seen on the remote prior examination and could be a physiologic variant. Please correlate with clinical findings to exclude lesion.   No new areas of abnormal radiotracer uptake at this time PFT:    Latest Ref Rng & Units 03/08/2023    2:27 PM 08/26/2018   12:59 PM 02/14/2018   11:18 AM 04/10/2016    9:11 AM  PFT Results  FVC-Pre L 1.84  1.69  1.80  P 1.53   FVC-Predicted Pre % 77  65  69  P 57   FVC-Post L 1.96  1.91  1.99  P 1.82   FVC-Predicted Post % 82  74  77  P 69   Pre FEV1/FVC % % 65  67  72  P 71   Post FEV1/FCV % % 67  66  72  P 70   FEV1-Pre L 1.20  1.12  1.30  P 1.09   FEV1-Predicted Pre % 68  58  67  P 54   FEV1-Post L 1.31  1.26  1.43  P 1.28   DLCO uncorrected ml/min/mmHg 11.14  11.93   11.07   DLCO UNC% % 63  55   51   DLCO corrected ml/min/mmHg 11.14  13.35     DLCO COR %Predicted % 63  61     DLVA Predicted % 91  97   86   TLC L 4.10  3.85   3.77   TLC % Predicted % 86  81   79   RV % Predicted % 98  94   96     P Preliminary result    Labs:  Path:  Echo:  Heart Catheterization:       Assessment & Plan:   COPD mixed type (HCC) - Plan: Pulse oximetry, overnight, Ambulatory Referral for DME, CANCELED: Ambulatory Referral for DME  Stage 2 moderate COPD by GOLD classification (HCC) - Plan: Ambulatory Referral for DME, CANCELED: Ambulatory Referral for DME  Chronic  hypoxemic respiratory failure (HCC) - Plan: ECHOCARDIOGRAM COMPLETE, Ambulatory Referral for DME  Discussion: Chelse Gunner is an 83 year old woman, former smoker with history of COPD, CAD, NSCLC of the RLL s/p radiation and hypertension who returns to pulmonary clinic for low oxygen  saturations.   COPD - continue trelegy ellipta  1 puff daily  Chronic Hypoxemic Respiratory Failure - will send in DME orders for supplemental oxygen  to include POC at 2L pulsed and home concentrator to use 2L with sleep. - will check overnight oxygen  test on room air to evaluate degree of nocturnal hypoxemia - check Echo to rule out pulmonary hypertension  Lung cancer Stable scarring with low uptake in treated area. Increased uptake in chest wall likely due to radiation scarring. No new abnormal uptake. Further evaluation by Dr. Selinda Dales planned. - Follow-up with Dr. Selinda Dales on May 14th.  PET uptake in Rectal areas Increased uptake in anal rectal region on PET scan. History of rectal bleeding and colonoscopy in 2023. Possible rectal or colon cancer. - She will follow up with her GI team - Consider rectosigmoidoscopy.  45 minutes spent on this visit.   Duaine German, MD [facility] Office: [phone]    Current Outpatient Medications:    acetaminophen  (TYLENOL ) 500 MG tablet, Take 500-1,000 mg by mouth every 6 (six) hours as needed (for pain)., Disp: , Rfl:    albuterol  (VENTOLIN  HFA) 108 (90 Base) MCG/ACT inhaler, Inhale 2 puffs into  the lungs every 6 (six) hours as needed for wheezing or shortness of breath., Disp: 8 g, Rfl: 6   atorvastatin  (LIPITOR) 40 MG tablet, Take 1 tablet (40 mg total) by mouth at bedtime. (Patient taking differently: Take 20 mg by mouth at bedtime.), Disp: 30 tablet, Rfl: 6   cetirizine  (ZYRTEC ) 10 MG tablet, Take 10 mg by mouth daily as needed for allergies. , Disp: , Rfl:    clopidogrel  (PLAVIX ) 75 MG tablet, Take 0.5 tablets (37.5 mg total) by  mouth in the morning., Disp: , Rfl:    dexlansoprazole  (DEXILANT ) 60 MG capsule, Take 60 mg by mouth daily before breakfast., Disp: , Rfl:    furosemide  (LASIX ) 40 MG tablet, Take 20 mg by mouth in the morning., Disp: , Rfl:    magnesium  oxide (MAG-OX) 400 (240 Mg) MG tablet, Take 400 mg by mouth daily after breakfast., Disp: , Rfl:    nitroGLYCERIN  (NITRODUR - DOSED IN MG/24 HR) 0.2 mg/hr patch, Place 0.2 mg onto the skin daily. Patient wears patch for 12 hours and removes, Disp: , Rfl:    nitroGLYCERIN  (NITROSTAT ) 0.4 MG SL tablet, Place 1 tablet (0.4 mg total) under the tongue every 5 (five) minutes x 3 doses as needed for chest pain., Disp: 10 tablet, Rfl: 0   olmesartan  (BENICAR ) 20 MG tablet, Take 1 tablet (20 mg total) by mouth daily. (Patient taking differently: Take 10 mg by mouth in the morning.), Disp: 90 tablet, Rfl: 1   Rimegepant Sulfate (NURTEC) 75 MG TBDP, Take 1 tablet (75 mg total) by mouth as needed., Disp: 8 tablet, Rfl: 6   TRELEGY ELLIPTA  200-62.5-25 MCG/ACT AEPB, USE 1 INHALATION BY MOUTH DAILY (Patient taking differently: Inhale 1 puff into the lungs daily.), Disp: 180 each, Rfl: 3   amiodarone  (PACERONE ) 200 MG tablet, Take 100 mg by mouth daily. (Patient not taking: Reported on 11/20/2023), Disp: , Rfl:    amLODipine  (NORVASC ) 2.5 MG tablet, Take 2.5 mg by mouth daily. (Patient not taking: Reported on 11/20/2023), Disp: , Rfl:    predniSONE  (DELTASONE ) 10 MG tablet, Take 10 mg by mouth daily with breakfast. (Patient not taking: Reported on 11/20/2023), Disp: , Rfl:

## 2023-12-18 NOTE — Patient Instructions (Addendum)
 Will order you a portable oxygen  concentrator to use when walking  We will check your oxygen  levels at night when sleeping on room air  We will update an echocardiogram  Continue trelegy ellipta  1 puff daily and as needed albuterol   Follow up 6/11 with Dr. Byrum

## 2023-12-19 ENCOUNTER — Telehealth: Payer: Self-pay | Admitting: Pulmonary Disease

## 2023-12-19 NOTE — Telephone Encounter (Signed)
 Per Devra Fontana at [facility]  This patient isnt on service with us . we cannot provide a POC at this time. IF this is a new start we would need the Frequency changed to continuous with stationary and portable oxygen  unit and we would need the sats co-signed OR copied and paste into the providers progress note.   Please advise. Thanks!

## 2023-12-19 NOTE — Telephone Encounter (Signed)
 Spoke w/ PT to give update on O2 and [facility] should be reaching out to her soon.

## 2023-12-20 ENCOUNTER — Telehealth: Payer: Self-pay

## 2023-12-20 ENCOUNTER — Telehealth: Payer: Self-pay | Admitting: Pulmonary Disease

## 2023-12-20 ENCOUNTER — Encounter: Payer: Self-pay | Admitting: Pulmonary Disease

## 2023-12-20 NOTE — Telephone Encounter (Signed)
 Devra Fontana w/ [facility] calling back.  Called office, and no one is responding. Please call Devra Fontana.

## 2023-12-20 NOTE — Telephone Encounter (Signed)
 Note completed, this likely held up oxygen  orders.  JD

## 2023-12-20 NOTE — Telephone Encounter (Signed)
 Spoke w/ PT told her I will look into her O2 since she was told she would have it today and also called Patrica of [facility] to find out about the O2 order

## 2023-12-20 NOTE — Telephone Encounter (Signed)
 Copied from CRM [id number]. Topic: Clinical - Prescription Issue >> Dec 19, 2023 12:50 PM [name] wrote: Reason for CRM: Patient states [facility] still hasn't received an order for her oxygen .  Please advise

## 2023-12-20 NOTE — Telephone Encounter (Signed)
 Cleotilde Dago; [place], [place]; [place], [place]; [place], [place]; [place], [place]; 1 other The sats still have no been co-signed. I received your voice mail but i do not have your direct contact thank you

## 2023-12-23 NOTE — Telephone Encounter (Signed)
 Patient was taken care of per Devra Fontana at [facility]. NFN

## 2023-12-23 NOTE — Telephone Encounter (Signed)
 Created in error

## 2023-12-23 NOTE — Telephone Encounter (Signed)
 Thank you. Will call PT to make sure she received her O2. No further action needed

## 2023-12-25 ENCOUNTER — Inpatient Hospital Stay (HOSPITAL_BASED_OUTPATIENT_CLINIC_OR_DEPARTMENT_OTHER): Admitting: Internal Medicine

## 2023-12-25 ENCOUNTER — Inpatient Hospital Stay: Attending: Internal Medicine

## 2023-12-25 VITALS — BP 156/50 | HR 61 | Temp 97.6°F | Resp 16 | Ht 64.0 in | Wt 140.2 lb

## 2023-12-25 DIAGNOSIS — D649 Anemia, unspecified: Secondary | ICD-10-CM | POA: Insufficient documentation

## 2023-12-25 DIAGNOSIS — J701 Chronic and other pulmonary manifestations due to radiation: Secondary | ICD-10-CM | POA: Diagnosis not present

## 2023-12-25 DIAGNOSIS — Z9981 Dependence on supplemental oxygen: Secondary | ICD-10-CM | POA: Insufficient documentation

## 2023-12-25 DIAGNOSIS — Y842 Radiological procedure and radiotherapy as the cause of abnormal reaction of the patient, or of later complication, without mention of misadventure at the time of the procedure: Secondary | ICD-10-CM | POA: Insufficient documentation

## 2023-12-25 DIAGNOSIS — C3431 Malignant neoplasm of lower lobe, right bronchus or lung: Secondary | ICD-10-CM | POA: Diagnosis present

## 2023-12-25 DIAGNOSIS — C3490 Malignant neoplasm of unspecified part of unspecified bronchus or lung: Secondary | ICD-10-CM | POA: Diagnosis not present

## 2023-12-25 DIAGNOSIS — D509 Iron deficiency anemia, unspecified: Secondary | ICD-10-CM | POA: Insufficient documentation

## 2023-12-25 DIAGNOSIS — J449 Chronic obstructive pulmonary disease, unspecified: Secondary | ICD-10-CM | POA: Diagnosis not present

## 2023-12-25 LAB — SAMPLE TO BLOOD BANK

## 2023-12-25 LAB — IRON AND IRON BINDING CAPACITY (CC-WL,HP ONLY)
Iron: 55 ug/dL (ref 28–170)
Saturation Ratios: 14 % (ref 10.4–31.8)
TIBC: 392 ug/dL (ref 250–450)
UIBC: 337 ug/dL (ref 148–442)

## 2023-12-25 LAB — FERRITIN: Ferritin: 22 ng/mL (ref 11–307)

## 2023-12-25 LAB — CBC WITH DIFFERENTIAL (CANCER CENTER ONLY)
Abs Immature Granulocytes: 0.01 10*3/uL (ref 0.00–0.07)
Basophils Absolute: 0 10*3/uL (ref 0.0–0.1)
Basophils Relative: 1 %
Eosinophils Absolute: 0.6 10*3/uL — ABNORMAL HIGH (ref 0.0–0.5)
Eosinophils Relative: 9 %
HCT: 29.7 % — ABNORMAL LOW (ref 36.0–46.0)
Hemoglobin: 9.5 g/dL — ABNORMAL LOW (ref 12.0–15.0)
Immature Granulocytes: 0 %
Lymphocytes Relative: 32 %
Lymphs Abs: 2 10*3/uL (ref 0.7–4.0)
MCH: 28.7 pg (ref 26.0–34.0)
MCHC: 32 g/dL (ref 30.0–36.0)
MCV: 89.7 fL (ref 80.0–100.0)
Monocytes Absolute: 0.5 10*3/uL (ref 0.1–1.0)
Monocytes Relative: 8 %
Neutro Abs: 3.2 10*3/uL (ref 1.7–7.7)
Neutrophils Relative %: 50 %
Platelet Count: 255 10*3/uL (ref 150–400)
RBC: 3.31 MIL/uL — ABNORMAL LOW (ref 3.87–5.11)
RDW: 12.9 % (ref 11.5–15.5)
WBC Count: 6.4 10*3/uL (ref 4.0–10.5)
nRBC: 0 % (ref 0.0–0.2)

## 2023-12-25 NOTE — Progress Notes (Signed)
 [facility] Telephone:([phone]) [phone]   [phone]  OFFICE PROGRESS NOTE  [place], [place] [address] [place] [address]  DIAGNOSIS:  1) stage IA non-small cell lung cancer, squamous cell carcinoma presented with right lower lobe pulmonary nodule diagnosed in August 2017. 2) persistent anemia questionable for anemia of chronic disease plus/minus iron  deficiency.  PRIOR THERAPY:  1) Feraheme  infusion on as-needed basis. 2) stereotactic radiotherapy to the right lower lobe lung nodule under the care of Dr. Lorri Rota.  CURRENT THERAPY: Observation.  INTERVAL HISTORY: [name] 83 y.o. female returns to the clinic today for follow-up visit accompanied by her daughter.  Discussed the use of AI scribe software for clinical note transcription with the patient, who gave verbal consent to proceed.  History of Present Illness   [name] is an 83 year old female with stage Ia non-small cell lung cancer who presents for evaluation and discussion of her recent PET scan results.  She has a history of stage Ia non-small cell lung cancer, specifically squamous cell carcinoma, diagnosed in August 2017. She underwent stereotactic radiotherapy and has been under observation since then. A recent CT scan of the chest showed an interval increase of a speculated right lower lobe lung mass. A PET scan was performed recently to further evaluate this finding.  Her oxygen  levels have been dropping to 80 and 77, necessitating the use of an oxygen  machine set at three liters per minute, which maintains her oxygen  saturation at nearly 100%. No chest pain, coughing, or hemoptysis, but she experiences pain on the right side of her chest wall.  She expresses concern about potential colon cancer due to a focal uptake along the anorectal region seen on a previous scan. She underwent a colonoscopy in 2023.       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HCC)    Arnold-Chiari malformation (HCC) 1998   Asthma    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   Blood in stool    last week of aug 2018   CAP (community acquired pneumonia) 10/22/2015   Chronic kidney disease    COPD (chronic obstructive pulmonary disease) (HCC)    Coronary artery disease    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   Diverticulitis    Dyspnea    with exertion   Gastroesophageal reflux disease    occ   Glaucoma    right eye   Headache syndrome 11/27/2018   Hiatal hernia    History of shingles 06/23/2018   Hyperlipidemia    Hypertension    Lung cancer (HCC) dx 2018   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   Myocardial infarction (HCC) 01/01/2000   Cardiac catheterization   Peripheral vascular disease (HCC)    stents in legs x 2 or 3   Pneumonia    last time winter 2017 -2018   PONV (postoperative nausea and vomiting)    occassionally, last colonscopy did ok with anesthesia   Primary cancer of right lower lobe of lung (HCC) 04/25/2016   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  MEDICATIONS:  Current Outpatient Medications  Medication Sig Dispense Refill   acetaminophen  (TYLENOL ) 500 MG tablet Take 500-1,000 mg by mouth every 6 (six) hours as needed (for pain).     albuterol  (VENTOLIN  HFA) 108 (90 Base) MCG/ACT inhaler Inhale 2 puffs into the lungs every 6 (six) hours as needed for wheezing or shortness of breath. 8 g 6   atorvastatin  (LIPITOR) 40 MG tablet Take 1 tablet (40 mg total) by mouth at bedtime.  (Patient taking differently: Take 20 mg by mouth at bedtime.) 30 tablet 6   cetirizine  (ZYRTEC ) 10 MG tablet Take 10 mg by mouth daily as needed for allergies.      clopidogrel  (PLAVIX ) 75 MG tablet Take 0.5 tablets (37.5 mg total) by mouth in the morning.     dexlansoprazole  (DEXILANT ) 60 MG capsule Take 60 mg by mouth daily before breakfast.     furosemide  (LASIX ) 40 MG tablet Take 20 mg by mouth in the morning.     magnesium  oxide (MAG-OX) 400 (240 Mg) MG tablet Take 400 mg by mouth daily after breakfast.     nitroGLYCERIN  (NITRODUR - DOSED IN MG/24 HR) 0.2 mg/hr patch Place 0.2 mg onto the skin daily. Patient wears patch for 12 hours and removes     nitroGLYCERIN  (NITROSTAT ) 0.4 MG SL tablet Place 1 tablet (0.4 mg total) under the tongue every 5 (five) minutes x 3 doses as needed for chest pain. 10 tablet 0   olmesartan  (BENICAR ) 20 MG tablet Take 1 tablet (20 mg total) by mouth daily. (Patient taking differently: Take 10 mg by mouth in the morning.) 90 tablet 1   Rimegepant Sulfate (NURTEC) 75 MG TBDP Take 1 tablet (75 mg total) by mouth as needed. 8 tablet 6   TRELEGY ELLIPTA  200-62.5-25 MCG/ACT AEPB USE 1 INHALATION BY MOUTH DAILY (Patient taking differently: Inhale 1 puff into the lungs daily.) 180 each 3   No current facility-administered medications for this visit.    SURGICAL HISTORY:  Past Surgical History:  Procedure Laterality Date   ABDOMINAL HYSTERECTOMY     APPENDECTOMY     Arnold-chiari malformation repair  1998   Suboccipital craniectomy   CAROTID ENDARTERECTOMY  03/29/2010   Left  CEA   CHOLECYSTECTOMY     Gall Bladder   COLONOSCOPY WITH PROPOFOL  N/A 04/22/2015   Procedure: COLONOSCOPY WITH PROPOFOL ;  Surgeon: Alvis Jourdain, MD;  Location: [facility];  Service: Endoscopy;  Laterality: N/A;   COLONOSCOPY WITH PROPOFOL  N/A 05/25/2016   Procedure: COLONOSCOPY WITH PROPOFOL ;  Surgeon: Alvis Jourdain, MD;  Location: [facility];  Service: Endoscopy;  Laterality: N/A;    COLONOSCOPY WITH PROPOFOL  N/A 05/03/2017   Procedure: COLONOSCOPY WITH PROPOFOL ;  Surgeon: Alvis Jourdain, MD;  Location: [facility];  Service: Endoscopy;  Laterality: N/A;   COLONOSCOPY WITH PROPOFOL  N/A 06/29/2020   Procedure: COLONOSCOPY WITH PROPOFOL ;  Surgeon: Alvis Jourdain, MD;  Location: [facility];  Service: Endoscopy;  Laterality: N/A;   COLONOSCOPY WITH PROPOFOL  N/A 01/05/2021   Procedure: COLONOSCOPY WITH PROPOFOL ;  Surgeon: Alvis Jourdain, MD;  Location: [facility];  Service: Endoscopy;  Laterality: N/A;   COLONOSCOPY WITH PROPOFOL  N/A 09/28/2021   Procedure: COLONOSCOPY WITH PROPOFOL ;  Surgeon: Alvis Jourdain, MD;  Location: [facility] ENDOSCOPY;  Service: Endoscopy;  Laterality: N/A;   CORNEAL TRANSPLANT     Right   CORONARY ARTERY BYPASS GRAFT  01/01/2000   x 3   ENDARTERECTOMY Right 09/26/2021   Procedure: RIGHT CAROTID ENDARTERECTOMY;  Surgeon: Dannis Dy, MD;  Location: [facility] OR;  Service: Vascular;  Laterality: Right;   ENTEROSCOPY N/A 02/27/2018   Procedure: ENTEROSCOPY;  Surgeon: Alvis Jourdain, MD;  Location: [facility];  Service: Endoscopy;  Laterality: N/A;   ENTEROSCOPY N/A 07/18/2018   Procedure: ENTEROSCOPY;  Surgeon: Alvis Jourdain, MD;  Location: [facility];  Service: Endoscopy;  Laterality: N/A;   ENTEROSCOPY N/A 06/29/2020   Procedure: ENTEROSCOPY;  Surgeon: Alvis Jourdain, MD;  Location: [facility];  Service: Endoscopy;  Laterality: N/A;   ENTEROSCOPY N/A 12/05/2021   Procedure: ENTEROSCOPY;  Surgeon: Alvis Jourdain, MD;  Location: [facility];  Service: Gastroenterology;  Laterality: N/A;   ENTEROSCOPY N/A 12/25/2021   Procedure: ENTEROSCOPY;  Surgeon: Alvis Jourdain, MD;  Location: [facility];  Service: Gastroenterology;  Laterality: N/A;  with endoscopic placement of video capsule   ESOPHAGOGASTRODUODENOSCOPY N/A 05/25/2016   Procedure: ESOPHAGOGASTRODUODENOSCOPY (EGD);  Surgeon: Alvis Jourdain, MD;  Location: [facility];  Service: Endoscopy;  Laterality:  N/A;   ESOPHAGOGASTRODUODENOSCOPY N/A 08/01/2018   Procedure: ESOPHAGOGASTRODUODENOSCOPY (EGD);  Surgeon: Alvis Jourdain, MD;  Location: [facility];  Service: Endoscopy;  Laterality: N/A;   ESOPHAGOGASTRODUODENOSCOPY (EGD) WITH PROPOFOL  N/A 09/28/2021   Procedure: ESOPHAGOGASTRODUODENOSCOPY (EGD) WITH PROPOFOL ;  Surgeon: Alvis Jourdain, MD;  Location: [facility] ENDOSCOPY;  Service: Endoscopy;  Laterality: N/A;   EYE SURGERY Right 1995 or 1996   Laser surgery for retinal hemorrhage   GIVENS CAPSULE STUDY N/A 07/16/2018   Procedure: GIVENS CAPSULE STUDY;  Surgeon: Alvis Jourdain, MD;  Location: [facility];  Service: Endoscopy;  Laterality: N/A;   GIVENS CAPSULE STUDY N/A 12/14/2021   Procedure: GIVENS CAPSULE STUDY;  Surgeon: Alvis Jourdain, MD;  Location: [facility] ENDOSCOPY;  Service: Gastroenterology;  Laterality: N/A;   GIVENS CAPSULE STUDY N/A 12/25/2021   Procedure: GIVENS CAPSULE STUDY;  Surgeon: Alvis Jourdain, MD;  Location: [facility];  Service: Gastroenterology;  Laterality: N/A;   HEMOSTASIS CLIP PLACEMENT  06/29/2020   Procedure: HEMOSTASIS CLIP PLACEMENT;  Surgeon: Alvis Jourdain, MD;  Location: [facility];  Service: Endoscopy;;   HEMOSTASIS CLIP PLACEMENT  01/05/2021   Procedure: HEMOSTASIS CLIP PLACEMENT;  Surgeon: Alvis Jourdain, MD;  Location: [facility];  Service: Endoscopy;;   HOT HEMOSTASIS N/A 02/27/2018   Procedure: HOT HEMOSTASIS (ARGON PLASMA COAGULATION/BICAP);  Surgeon: Alvis Jourdain, MD;  Location: [facility];  Service: Endoscopy;  Laterality: N/A;   HOT HEMOSTASIS N/A 08/01/2018   Procedure: HOT HEMOSTASIS (ARGON PLASMA COAGULATION/BICAP);  Surgeon: Alvis Jourdain, MD;  Location: [facility];  Service: Endoscopy;  Laterality: N/A;   HOT HEMOSTASIS N/A 06/29/2020   Procedure: HOT HEMOSTASIS (ARGON PLASMA COAGULATION/BICAP);  Surgeon: Alvis Jourdain, MD;  Location: [facility];  Service: Endoscopy;  Laterality: N/A;   HOT HEMOSTASIS N/A 01/05/2021   Procedure: HOT HEMOSTASIS (ARGON  PLASMA COAGULATION/BICAP);  Surgeon: Alvis Jourdain, MD;  Location: [facility];  Service: Endoscopy;  Laterality: N/A;   HOT HEMOSTASIS N/A 12/05/2021   Procedure: HOT HEMOSTASIS (ARGON PLASMA COAGULATION/BICAP);  Surgeon: Alvis Jourdain, MD;  Location: [facility];  Service: Gastroenterology;  Laterality: N/A;   IR RADIOLOGIST EVAL & MGMT  12/14/2016   IR RADIOLOGIST EVAL & MGMT  07/26/2021   IR RADIOLOGIST EVAL & MGMT  08/21/2023   LEFT HEART CATH AND CORS/GRAFTS ANGIOGRAPHY N/A 01/09/2018   Procedure: LEFT HEART CATH AND CORS/GRAFTS ANGIOGRAPHY;  Surgeon: Chapman Commodore, MD;  Location: [facility] INVAS[facility] CV [facility];  Service: Cardiovascular;  Laterality: N/A;   LEFT HEART CATHETERIZATION WITH CORONARY ANGIOGRAM N/A 08/03/2014   Procedure: LEFT HEART CATHETERIZATION WITH CORONARY ANGIOGRAM;  Surgeon: Darrold Emms, MD;  Location: [facility] CATH LAB;  Service: Cardiovascular;  Laterality: N/A;   PATCH ANGIOPLASTY Right 09/26/2021   Procedure: PATCH ANGIOPLASTY RIGHT CAROTID;  Surgeon: Dannis Dy, MD;  [facility]: [facility] OR;  Service: Vascular;  Laterality: Right;   Post Coronary Artery  BPG  01/05/2000   Right jugular sheath removed   PR VEIN BYPASS GRAFT,AORTO-FEM-POP     ROTATOR CUFF REPAIR     Right    REVIEW OF SYSTEMS:  Constitutional: positive for fatigue Eyes: negative Ears, nose, mouth, throat, and face: negative Respiratory: positive for dyspnea on exertion Cardiovascular: negative Gastrointestinal: negative Genitourinary:negative Integument/breast: negative Hematologic/lymphatic: negative Musculoskeletal:negative Neurological: negative Behavioral/Psych: negative Endocrine: negative Allergic/Immunologic: negative   PHYSICAL EXAMINATION: General appearance: alert, cooperative, fatigued, and no distress Head: Normocephalic, without obvious abnormality, atraumatic Neck: no adenopathy, no JVD, supple, symmetrical, trachea midline, and thyroid  not enlarged, symmetric, no  tenderness/mass/nodules Lymph nodes: Cervical, supraclavicular, and axillary nodes normal. Resp: clear to auscultation bilaterally Back: symmetric, no curvature. ROM normal. No CVA tenderness. Cardio: regular rate and rhythm, S1, S2 normal, no murmur, click, rub or gallop GI: soft, non-tender; bowel sounds normal; no masses,  no organomegaly Extremities: extremities normal, atraumatic, no cyanosis or edema Neurologic: Alert and oriented X 3, normal strength and tone. Normal symmetric reflexes. Normal coordination and gait  ECOG PERFORMANCE STATUS: 1 - Symptomatic but completely ambulatory  Blood pressure (!) 156/50, pulse 61, temperature 97.6 F (36.4 C), temperature source Temporal, resp. rate 16, height 5\' 4"  (1.626 m), weight 140 lb 3.2 oz (63.6 kg), SpO2 100%.  LABORATORY DATA: Lab Results  Component Value Date   WBC 6.4 12/25/2023   HGB 9.5 (L) 12/25/2023   HCT 29.7 (L) 12/25/2023   MCV 89.7 12/25/2023   PLT 255 12/25/2023      Chemistry      Component Value Date/Time   NA 141 11/19/2023 1913   NA 139 08/02/2017 0821   K 4.3 11/19/2023 1913   K 4.3 08/02/2017 0821   CL 108 11/19/2023 1913   CO2 24 11/19/2023 1913   CO2 24 08/02/2017 0821   BUN 31 (H) 11/19/2023 1913   BUN 14.4 08/02/2017 0821   CREATININE 1.71 (H) 11/19/2023 1913   CREATININE 1.96 (H) 05/23/2023 1510   CREATININE 1.3 (H) 08/02/2017 0821      Component Value Date/Time   CALCIUM  9.3 11/19/2023 1913   CALCIUM  9.2 08/02/2017 0821   ALKPHOS 58 09/02/2023 1313   ALKPHOS 66 08/02/2017 0821   AST 14 (L) 09/02/2023 1313   AST 14 (L) 05/23/2023 1510   AST 14 08/02/2017 0821   ALT 10 09/02/2023 1313   ALT 7 05/23/2023 1510   ALT 8 08/02/2017 0821   BILITOT 0.3 09/02/2023 1313   BILITOT 0.3 05/23/2023 1510   BILITOT 0.34 08/02/2017 0821       RADIOGRAPHIC STUDIES: NM PET Image Restage (PS) Skull Base to Thigh (F-18 FDG) Result Date: 12/12/2023 CLINICAL DATA:  Subsequent treatment strategy for Non  small cell lung cancer. EXAM: NUCLEAR MEDICINE PET SKULL BASE TO THIGH TECHNIQUE: 6.88 mCi F-18 FDG was injected intravenously. Full-ring PET imaging was performed from the skull base to thigh after the radiotracer. CT data was obtained and used for attenuation correction and anatomic localization. Fasting blood glucose: 110 mg/dl COMPARISON:  CT angiogram chest abdomen pelvis 11/20/2023. Older CT scans. PET-CT scan 03/14/2016. FINDINGS: Mediastinal blood pool activity: SUV max 2.8 Liver activity: SUV max 3.9 NECK: No specific abnormal radiotracer uptake seen in the neck including along  lymph node change of the posterior triangle, internal jugular or submandibular region. Near symmetric uptake of the visualized intracranial compartment. Incidental CT findings: Underpneumatized mastoid air cells. There is some mild opacity along the paranasal sinuses. The parotid glands, submandibular glands and thyroid  gland are unremarkable. CHEST: No abnormal uptake seen above mediastinal blood pool in the axillary region, hilum or mediastinum. Lobular area in the right lower lobe seen on previous CTs is again seen today on CT image 79. In October 2024 this area measured 2.8 x 1.9 cm and today 2.7 x 2.0 cm. This has mild uptake with maximum SUV of 2.8. This very well could be posttreatment related. On the more recent study of November 20, 2023 the area was measured larger at 3.5 by 2.1 cm. Persistent area of more bandlike changes more caudal in the right lower lobe is without abnormal uptake. There is some mild uptake as well along the adjacent chest wall right posterolateral with maximum SUV of 4.1. There is some thickening, calcification and chest wall distortion this location, unchanged from previous examinations as well and also could be posttreatment related. No additional abnormal uptake along the lung parenchyma. Incidental CT findings: Calcified right-sided anterior lung nodules again seen consistent with old granulomatous  disease. Postop chest. Coronary calcifications. Heart is nonenlarged. No pericardial effusion. The normal course and caliber to the thoracic aorta. ABDOMEN/PELVIS: Physiologic distribution radiotracer along the parenchymal organs and renal collecting systems. There is some mild uptake along the anal region with some uptake maximum SUV value of 7.8. There was some uptake in this location on the old PET-CT scan. Please correlate with clinical findings. Incidental CT findings: Bilateral renal atrophy. No obstructing renal stones. Underdistended urinary bladder. Uterus absent. Surgical changes along the pelvic floor caudal to the anterior aspect of the urinary bladder. Bowel is nondilated. Scattered colonic stool. Few colonic diverticula. Prior cholecystectomy. Moderate calcifications along the aorta and branch vessels. Bilateral common iliac artery stents. SKELETON: No abnormal uptake along the visualized osseous structures. Scattered degenerative changes. IMPRESSION: Nodular right lower lobe parenchymal opacity is similar to the older CT scans with only low level uptake. This could be posttreatment related. Continued follow-up CT surveillance. Stable distortion of the adjacent posterolateral chest wall with pleural thickening calcifications with low-level uptake as well. Focal uptake along the anorectal region. This was seen on the remote prior examination and could be a physiologic variant. Please correlate with clinical findings to exclude lesion. No new areas of abnormal radiotracer uptake at this time Electronically Signed   By: Adrianna Horde M.D.   On: 12/12/2023 17:38     ASSESSMENT AND PLAN:  This is a very pleasant 83 years old white female with a stage IA non-small cell lung cancer, squamous cell carcinoma presented with right lower lobe pulmonary nodule status post stereotactic radiotherapy.  She is currently on observation and she is feeling fine except for the baseline fatigue and shortness of  breath. She had repeat CT scan of the chest, abdomen pelvis performed recently.  She has enlargement of the right lower lobe lung mass concerning for disease progression. The patient had a PET scan performed recently.  I personally independently reviewed the scan and discussed the result with the patient and her daughter today.  The PET scan showed no concerning findings for disease recurrence or metastasis. Assessment and Plan    Stage Ia non-small cell lung cancer Stage Ia non-small cell lung cancer, squamous cell carcinoma, diagnosed in August 2017. Status post stereotactic radiotherapy with  no new activity on recent PET scan. Right lower lobe lung mass shows low-level uptake, likely post-treatment related. No new spots or concerning findings. - Continue routine follow-up and observation - Schedule CT scan in six months  Radiation-induced lung scarring Radiation-induced lung scarring on imaging, contributing to respiratory symptoms. No new activity on PET scan, consistent with post-radiation changes.  COPD COPD contributing to oxygen  desaturation episodes. Oxygen  saturation drops to 77-80% without supplemental oxygen . Currently on 3L oxygen , maintaining saturation close to 100%. - Continue oxygen  therapy as prescribed  Iron  deficiency anemia Iron  deficiency anemia previously treated with iron  infusion. Current hemoglobin is 9.5 g/dL. Awaiting iron  level results. - Await iron  level results   She was advised to call immediately if she has any other concerning symptoms in the interval. All questions were answered. The patient knows to call the clinic with any problems, questions or concerns. We can certainly see the patient much sooner if necessary.  Disclaimer: This note was dictated with voice recognition software. Similar sounding words can inadvertently be transcribed and may not be corrected upon review.

## 2023-12-30 ENCOUNTER — Ambulatory Visit (HOSPITAL_COMMUNITY)
Admission: RE | Admit: 2023-12-30 | Discharge: 2023-12-30 | Disposition: A | Discharge status: Home / Self Care | Source: Ambulatory Visit | Attending: Internal Medicine | Admitting: Internal Medicine

## 2023-12-30 DIAGNOSIS — Z87891 Personal history of nicotine dependence: Secondary | ICD-10-CM | POA: Diagnosis not present

## 2023-12-30 DIAGNOSIS — Z85118 Personal history of other malignant neoplasm of bronchus and lung: Secondary | ICD-10-CM | POA: Insufficient documentation

## 2023-12-30 DIAGNOSIS — I252 Old myocardial infarction: Secondary | ICD-10-CM | POA: Insufficient documentation

## 2023-12-30 DIAGNOSIS — I2510 Atherosclerotic heart disease of native coronary artery without angina pectoris: Secondary | ICD-10-CM | POA: Insufficient documentation

## 2023-12-30 DIAGNOSIS — I340 Nonrheumatic mitral (valve) insufficiency: Secondary | ICD-10-CM

## 2023-12-30 DIAGNOSIS — J449 Chronic obstructive pulmonary disease, unspecified: Secondary | ICD-10-CM | POA: Diagnosis not present

## 2023-12-30 DIAGNOSIS — Z8673 Personal history of transient ischemic attack (TIA), and cerebral infarction without residual deficits: Secondary | ICD-10-CM | POA: Diagnosis not present

## 2023-12-30 DIAGNOSIS — I10 Essential (primary) hypertension: Secondary | ICD-10-CM | POA: Diagnosis not present

## 2023-12-30 DIAGNOSIS — J9611 Chronic respiratory failure with hypoxia: Secondary | ICD-10-CM

## 2023-12-30 DIAGNOSIS — Z923 Personal history of irradiation: Secondary | ICD-10-CM | POA: Diagnosis not present

## 2023-12-30 DIAGNOSIS — R0600 Dyspnea, unspecified: Secondary | ICD-10-CM | POA: Diagnosis present

## 2023-12-30 DIAGNOSIS — E785 Hyperlipidemia, unspecified: Secondary | ICD-10-CM | POA: Insufficient documentation

## 2023-12-30 LAB — ECHOCARDIOGRAM COMPLETE
Area-P 1/2: 2.86 cm2
S' Lateral: 3 cm

## 2024-01-08 ENCOUNTER — Telehealth: Payer: Self-pay | Admitting: Pulmonary Disease

## 2024-01-08 NOTE — Telephone Encounter (Signed)
 Cmn received from [employer] for O2.

## 2024-01-09 ENCOUNTER — Telehealth: Payer: Self-pay

## 2024-01-09 NOTE — Telephone Encounter (Signed)
 Copied from CRM [id number]. Topic: Clinical - Lab/Test Results >> Jan 09, 2024 11:30 AM Corean Deutscher wrote: Reason for CRM: Patient called regarding her test results for an at home test she completed.    LM ok per HIPAA.  Received results.. sent to Dr.Dewald to review and someone will get back to her

## 2024-01-14 ENCOUNTER — Telehealth: Payer: Self-pay | Admitting: Pulmonary Disease

## 2024-01-14 NOTE — Telephone Encounter (Signed)
 Everything has been resolved [name] from [employer] is able to use the first order and fill her POC she will be reaching out to her in the next 20 minutes.

## 2024-01-14 NOTE — Telephone Encounter (Signed)
 PCC's when reaching [employer] lease have CMN made out with Dr. Rayne Callas name to avoid future issues. This per Loetta Ringer.

## 2024-01-14 NOTE — Telephone Encounter (Signed)
 PT's daughter came in asking about her mom's O2 order. Dr. Reine Caraway nurse said he took file w/him and he has 3 week )2 duty. Daughter was distressed because her mom has desperate need of O2.  Can PCC's please request dup copy and I will have Dr. Marvia Slocumb the day sign order. The [employer] contact was Shirline Dover.

## 2024-01-20 NOTE — Telephone Encounter (Signed)
 LM okay per HIPAA

## 2024-01-20 NOTE — Telephone Encounter (Signed)
 Patient did not have desaturations when sleeping at night, she does not need oxygen  at night when sleeping.  JD

## 2024-01-22 ENCOUNTER — Ambulatory Visit: Admitting: Emergency Medicine

## 2024-01-22 ENCOUNTER — Encounter: Payer: Self-pay | Admitting: Emergency Medicine

## 2024-01-22 VITALS — BP 136/68 | HR 78 | Temp 97.4°F | Ht 62.0 in | Wt 141.4 lb

## 2024-01-22 DIAGNOSIS — J449 Chronic obstructive pulmonary disease, unspecified: Secondary | ICD-10-CM | POA: Diagnosis not present

## 2024-01-22 DIAGNOSIS — J9611 Chronic respiratory failure with hypoxia: Secondary | ICD-10-CM

## 2024-01-22 DIAGNOSIS — C3431 Malignant neoplasm of lower lobe, right bronchus or lung: Secondary | ICD-10-CM | POA: Diagnosis not present

## 2024-01-22 NOTE — Assessment & Plan Note (Signed)
 Please continue Trelegy 1 inhalation once daily.  Rinse and gargle after using. Continue your albuterol  either 2 puffs or 1 nebulizer treatment up to every 4 hours as you needed for shortness of breath, chest tightness, wheezing. Keep your COVID-19 and flu vaccines up-to-date We may decide to consider starting additional medication in the future (Daliresp or other) to help decrease the frequency with which your COPD flares. Follow Dr. Baldwin Levee in 6 months, sooner if you have any problems.

## 2024-01-22 NOTE — Patient Instructions (Addendum)
 Please continue Trelegy 1 inhalation once daily.  Rinse and gargle after using. Continue your albuterol  either 2 puffs or 1 nebulizer treatment up to every 4 hours as you needed for shortness of breath, chest tightness, wheezing. Keep your COVID-19 and flu vaccines up-to-date We may decide to consider starting additional medication in the future (Daliresp or other) to help decrease the frequency with which your COPD flares. Please continue your Zyrtec  every day Continue to use your nasal saline spray and your humidifier as needed Try starting nasal saline gel as needed to help keep your nose moist and to avoid irritation Wear your oxygen  at 3 L/min with exertion. Go ahead and start wearing your oxygen  3 L/min while sleeping until we can recheck your overnight oximetry on room air Get your repeat chest imaging as per Dr. Bing Buff plans and follow-up with Dr. Marguerita Shih as scheduled Follow Dr. Baldwin Levee in 6 months, sooner if you have any problems.

## 2024-01-22 NOTE — Progress Notes (Signed)
 Subjective:    Patient ID: [name], female    DOB: Jun 02, 1941, 83 y.o.   MRN: [id number]  HPI 83 year old former smoker (45 pack years), has been followed in our office for COPD, non-small cell lung cancer with radiation to the right lower lobe.  Also with hypertension, CAD, chronic hypoxemic respiratory failure on 3 L/min by POC with exertion.  Some question as to whether she needs to be on oxygen  at night while sleeping.  She had an overnight oximetry but states that this was done while wearing oxygen  3L/min, not on room air.  Currently managed on Trelegy, uses albuterol  approximately 1-2x a day Remains on Zyrtec  daily, Dexilant  daily. She is still able to exert her self, does some chores at home. She does see desats with some work. She has daily cough, thick white mucous. She uses albuterol  about 1-2x a day. Her last flare was 2 months ago, treated w pred and abx. She flares 3-4x a year.    Most recent chest imaging PET scan 12/10/2023 with a nodular right lower lobe parenchymal opacity similar to older CT scans and with minimal uptake, consistent with posttreatment changes, stable distortion adjacent posterior lateral chest wall with some pleural thickening calcification.  She is followed by Dr. Marguerita Shih.  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   Blood in stool    last week of aug 2018   CAP (community acquired pneumonia) 10/22/2015   Chronic kidney disease    COPD (chronic obstructive pulmonary disease) (HCC)    Coronary  artery disease    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   Diverticulitis    Dyspnea    with exertion   Gastroesophageal reflux disease    occ   Glaucoma    right eye   Headache syndrome 11/27/2018   Hiatal hernia    History of shingles 06/23/2018   Hyperlipidemia    Hypertension    Lung cancer (HCC) dx 2018   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   Myocardial infarction (HCC) 01/01/2000   Cardiac catheterization   Peripheral vascular disease (HCC)    stents in legs x 2 or 3   Pneumonia    last time winter 2017 -2018   PONV (postoperative nausea and vomiting)    occassionally, last colonscopy did ok with anesthesia   Primary cancer of right lower lobe of lung (HCC) 04/25/2016   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   TIA (transient ischemic attack) 02/11/2019   Wears dentures    Full set   Wears glasses      Family History  Problem Relation Age of Onset   Heart disease Mother        Heart Disease before age 29   Hypertension Mother    Hyperlipidemia Mother    Heart attack Mother    Clotting disorder Mother    Heart disease Father        Heart Disease before age 89   Heart attack Father    Hyperlipidemia Father    Hypertension Father  Heart disease Brother        Heart Disease before age 75   Hyperlipidemia Brother    Hypertension Brother    Clotting disorder Brother    AAA (abdominal aortic aneurysm) Brother    Cerebral aneurysm Sister    Hypertension Sister    AAA (abdominal aortic aneurysm) Sister    Asthma Sister    Cerebral aneurysm Brother    Cancer Brother        Lung   Hypertension Brother    Heart attack Brother    Heart disease Brother        Aneurysm of Brain   Hypertension Brother    Heart disease Brother    Heart disease Brother    Stroke Son        Aneurysm of Stomach   AAA (abdominal aortic aneurysm) Son    Cancer Maternal Uncle        great uncle/cancer/type unknown     Social History   Socioeconomic History    Marital status: Widowed    Spouse name: Not on file   Number of children: 4   Years of education: 7TH   Highest education level: Not on file  Occupational History   Not on file  Tobacco Use   Smoking status: Former    Current packs/day: 0.00    Average packs/day: 1.5 packs/day for 30.0 years (45.0 ttl pk-yrs)    Types: Cigarettes    Start date: 08/13/1970    Quit date: 08/13/2000    Years since quitting: 23.4    Passive exposure: Never   Smokeless tobacco: Never  Vaping Use   Vaping status: Never Used  Substance and Sexual Activity   Alcohol use: No    Alcohol/week: 0.0 standard drinks of alcohol   Drug use: No   Sexual activity: Never  Other Topics Concern   Not on file  Social History Narrative   Lives at home w/ her son   Right-handed   Drinks coffee and [employer]eps[employer] daily   Social Drivers of Health   Financial Resource Strain: Low Risk  (11/20/2023)   Received from [facility]   Overall Financial Resource Strain (CARDIA)    Difficulty of Paying Living Expenses: Not hard at all  Food Insecurity: No Food Insecurity (11/20/2023)   [facility]   Hunger Vital Sign    Worried About Running Out of Food in the Last Year: Never true    Ran Out of Food in the Last Year: Never true  Transportation Needs: No Transportation Needs (11/20/2023)   Received from [facility] - Transportation    Lack of Transportation (Medical): No    Lack of Transportation (Non-Medical): No  Physical Activity: Unknown (11/20/2023)   Received from [facility]   Exercise Vital Sign    Days of Exercise per Week: 0 days    Minutes of Exercise per Session: Not on file  Stress: Patient Declined (11/20/2023)   Received from [facility] - Occupational Stress Questionnaire    Feeling of Stress : Patient declined  Social Connections: Socially Isolated (11/20/2023)   Received from [facility]   Social Network    How would you rate your social  network (family, work, friends)?: Little participation, lonely and socially isolated  Intimate Partner Violence: Not At Risk (11/20/2023)   Received from [facility]   HITS    Over the last 12 months how often did your partner physically hurt  you?: Never    Over the last 12 months how often did your partner insult you or talk down to you?: Never    Over the last 12 months how often did your partner threaten you with physical harm?: Never    Over the last 12 months how often did your partner scream or curse at you?: Never     Allergies  Allergen Reactions   Avelox [Moxifloxacin Hcl In Nacl] Palpitations and Other (See Comments)    Caused Heart Attack    Azithromycin  Swelling and Other (See Comments)    Patient reported past history of lip swelling   Codeine Other (See Comments)    Dr. Glena Landau advised patient not to take this medication   Doxycycline  Swelling and Other (See Comments)    Mouth, lips, feet swelling   Hydromorphone  Palpitations and Other (See Comments)    DILAUDID   -  Pt had a Heart Attack after taking Dilaudid .   Levaquin  [Levofloxacin ] Shortness Of Breath and Other (See Comments)    Chest pressure, SOB, pain in between shoulder blades, sweaty -as reported by patient per experience in ED this afternoon   Vitamin D Analogs Swelling and Other (See Comments)    Face and lips swell, but no breathing issues   Nifedipine Er Other (See Comments)    Dropped the heart rate too low   Octreotide      Chest pain, N/V   Zetia [Ezetimibe] Other (See Comments)    Made me feel like my face was going to sleep/numb   Oxycodone-Acetaminophen  Other (See Comments)    Says it makes her feel weird   Risedronate Other (See Comments)    Chest pain     Outpatient Medications Prior to Visit  Medication Sig Dispense Refill   acetaminophen  (TYLENOL ) 500 MG tablet Take 500-1,000 mg by mouth every 6 (six) hours as needed (for pain).     albuterol  (VENTOLIN  HFA) 108 (90 Base) MCG/ACT  inhaler Inhale 2 puffs into the lungs every 6 (six) hours as needed for wheezing or shortness of breath. 8 g 6   atorvastatin  (LIPITOR) 40 MG tablet Take 1 tablet (40 mg total) by mouth at bedtime. (Patient taking differently: Take 20 mg by mouth at bedtime.) 30 tablet 6   cetirizine  (ZYRTEC ) 10 MG tablet Take 10 mg by mouth daily as needed for allergies.      clopidogrel  (PLAVIX ) 75 MG tablet Take 0.5 tablets (37.5 mg total) by mouth in the morning.     dexlansoprazole  (DEXILANT ) 60 MG capsule Take 60 mg by mouth daily before breakfast.     furosemide  (LASIX ) 40 MG tablet Take 20 mg by mouth in the morning.     magnesium  oxide (MAG-OX) 400 (240 Mg) MG tablet Take 400 mg by mouth daily after breakfast.     nitroGLYCERIN  (NITRODUR - DOSED IN MG/24 HR) 0.2 mg/hr patch Place 0.2 mg onto the skin daily. Patient wears patch for 12 hours and removes     nitroGLYCERIN  (NITROSTAT ) 0.4 MG SL tablet Place 1 tablet (0.4 mg total) under the tongue every 5 (five) minutes x 3 doses as needed for chest pain. 10 tablet 0   olmesartan  (BENICAR ) 20 MG tablet Take 1 tablet (20 mg total) by mouth daily. (Patient taking differently: Take 10 mg by mouth in the morning.) 90 tablet 1   Rimegepant Sulfate (NURTEC) 75 MG TBDP Take 1 tablet (75 mg total) by mouth as needed. 8 tablet 6   TRELEGY ELLIPTA  200-62.5-25 MCG/ACT AEPB USE  1 INHALATION BY MOUTH DAILY (Patient taking differently: Inhale 1 puff into the lungs daily.) 180 each 3   No facility-administered medications prior to visit.         Objective:   Physical Exam Vitals:   01/22/24 0823  BP: 136/68  Pulse: 78  Temp: (!) 97.4 F (36.3 C)  TempSrc: Temporal  SpO2: 98%  Weight: 141 lb 6.4 oz (64.1 kg)  Height: 5' 2 (1.575 m)   Gen: Pleasant, elderly woman, in no distress,  normal affect  ENT: No lesions,  mouth clear,  oropharynx clear, no postnasal drip  Neck: No JVD, no stridor  Lungs: No use of accessory muscles, distant, few B end-exp  wheezes  Cardiovascular: RRR, heart sounds normal, no murmur or gallops, trace peripheral edema  Musculoskeletal: No deformities, no cyanosis or clubbing  Neuro: alert, awake, non focal  Skin: Warm, no lesions or rash      Assessment & Plan:   COPD Please continue Trelegy 1 inhalation once daily.  Rinse and gargle after using. Continue your albuterol  either 2 puffs or 1 nebulizer treatment up to every 4 hours as you needed for shortness of breath, chest tightness, wheezing. Keep your COVID-19 and flu vaccines up-to-date We may decide to consider starting additional medication in the future (Daliresp or other) to help decrease the frequency with which your COPD flares. Follow Dr. Baldwin Levee in 6 months, sooner if you have any problems.  Primary cancer of right lower lobe of lung [facility]) Get your repeat chest imaging as per Dr. Bing Buff plans and follow-up with Dr. Marguerita Shih as scheduled  Chronic respiratory failure with hypoxia [facility]) She is experiencing dryness she due to the oxygen  flow rate.  Will try to use moist with nasal saline. She needs a repeat ONO to determine whether she should wear nocturnal O2 - apparently the first one was done on O2 instead of RA  Wear your oxygen  at 3 L/min with exertion. Go ahead and start wearing your oxygen  3 L/min while sleeping until we can recheck your overnight oximetry on room air   Time spent 40 minutes  Racheal Buddle, MD, PhD 01/22/2024, 5:22 PM [facility] [phone] or if no answer before 7:00PM call [phone] For any issues after 7:00PM please call eLink [phone]

## 2024-01-22 NOTE — Assessment & Plan Note (Signed)
 Get your repeat chest imaging as per Dr. Bing Buff plans and follow-up with Dr. Marguerita Shih as scheduled

## 2024-01-22 NOTE — Assessment & Plan Note (Signed)
 She is experiencing dryness she due to the oxygen  flow rate.  Will try to use moist with nasal saline. She needs a repeat ONO to determine whether she should wear nocturnal O2 - apparently the first one was done on O2 instead of RA  Wear your oxygen  at 3 L/min with exertion. Go ahead and start wearing your oxygen  3 L/min while sleeping until we can recheck your overnight oximetry on room air

## 2024-02-08 ENCOUNTER — Encounter (HOSPITAL_COMMUNITY): Payer: Self-pay | Admitting: Interventional Radiology

## 2024-02-12 ENCOUNTER — Ambulatory Visit: Payer: Self-pay

## 2024-02-12 NOTE — Telephone Encounter (Signed)
 ED if worse before tomorrow.

## 2024-02-12 NOTE — Telephone Encounter (Signed)
 FYI pt has appt on 02/13/2024 with you

## 2024-02-12 NOTE — Telephone Encounter (Signed)
 FYI Only or Action Required?: FYI only for provider.  Patient is followed in Pulmonology for COpD, Respiratory Failure, last seen on 01/22/2024 by Shelah Lamar RAMAN, MD. Called Nurse Triage reporting Shortness of Breath. Symptoms began several days ago. Interventions attempted: Home oxygen  use. Symptoms are: gradually worsening.  Triage Disposition: See HCP Within 4 Hours (Or PCP Triage)  Patient/caregiver understands and will follow disposition?: Yes   Copied from CRM [id number]. Topic: Clinical - Red Word Triage >> Feb 12, 2024 10:24 AM Russell PARAS wrote: Red Word that prompted transfer to Nurse Triage:   Pt is experiencing difficulty breathing Reason for Disposition  [1] MILD difficulty breathing (e.g., minimal/no SOB at rest, SOB with walking, pulse <100) AND [2] NEW-onset or WORSE than normal  Answer Assessment - Initial Assessment Questions 1. RESPIRATORY STATUS: Describe your breathing? (e.g., wheezing, shortness of breath, unable to speak, severe coughing)      Gasping for air, Episodic  2. ONSET: When did this breathing problem begin?      Several Days Ago  3. PATTERN Does the difficult breathing come and go, or has it been constant since it started?      Intermittent  4. SEVERITY: How bad is your breathing? (e.g., mild, moderate, severe)    - MILD: No SOB at rest, mild SOB with walking, speaks normally in sentences, can lie down, no retractions, pulse < 100.    - MODERATE: SOB at rest, SOB with minimal exertion and prefers to sit, cannot lie down flat, speaks in phrases, mild retractions, audible wheezing, pulse 100-120.    - SEVERE: Very SOB at rest, speaks in single words, struggling to breathe, sitting hunched forward, retractions, pulse > 120      Mild  5. RECURRENT SYMPTOM: Have you had difficulty breathing before? If Yes, ask: When was the last time? and What happened that time?      Yes, all Lung Related issues  6. CARDIAC HISTORY: Do you have any history of  heart disease? (e.g., heart attack, angina, bypass surgery, angioplasty)      CAD, CHF, Multiple Valve Issues  7. LUNG HISTORY: Do you have any history of lung disease?  (e.g., pulmonary embolus, asthma, emphysema)     COPD, Lung CA, Respiratory Failure  8. CAUSE: What do you think is causing the breathing problem?      Unsure  9. OTHER SYMPTOMS: Do you have any other symptoms? (e.g., dizziness, runny nose, cough, chest pain, fever)     Runny Nose  10. O2 SATURATION MONITOR:  Do you use an oxygen  saturation monitor (pulse oximeter) at home? If Yes, ask: What is your reading (oxygen  level) today? What is your usual oxygen  saturation reading? (e.g., 95%)       93-96 Averaging on 3L.   11. PREGNANCY: Is there any chance you are pregnant? When was your last menstrual period?       No and No  12. TRAVEL: Have you traveled out of the country in the last month? (e.g., travel history, exposures)       No  Protocols used: Breathing Difficulty-A-AH

## 2024-02-12 NOTE — Telephone Encounter (Signed)
 Patient's daughter called, left VM to return the call.

## 2024-02-12 NOTE — Telephone Encounter (Signed)
Pt notified on VM. 

## 2024-02-13 ENCOUNTER — Encounter: Payer: Self-pay | Admitting: Nurse Practitioner

## 2024-02-13 ENCOUNTER — Ambulatory Visit (INDEPENDENT_AMBULATORY_CARE_PROVIDER_SITE_OTHER)

## 2024-02-13 ENCOUNTER — Ambulatory Visit: Admitting: Nurse Practitioner

## 2024-02-13 ENCOUNTER — Ambulatory Visit: Payer: Self-pay | Admitting: Nurse Practitioner

## 2024-02-13 VITALS — BP 130/50 | HR 68 | Ht 62.0 in | Wt 140.4 lb

## 2024-02-13 DIAGNOSIS — J441 Chronic obstructive pulmonary disease with (acute) exacerbation: Secondary | ICD-10-CM | POA: Diagnosis not present

## 2024-02-13 DIAGNOSIS — J9611 Chronic respiratory failure with hypoxia: Secondary | ICD-10-CM

## 2024-02-13 DIAGNOSIS — R0609 Other forms of dyspnea: Secondary | ICD-10-CM

## 2024-02-13 DIAGNOSIS — J449 Chronic obstructive pulmonary disease, unspecified: Secondary | ICD-10-CM

## 2024-02-13 DIAGNOSIS — C3431 Malignant neoplasm of lower lobe, right bronchus or lung: Secondary | ICD-10-CM | POA: Diagnosis not present

## 2024-02-13 MED ORDER — PREDNISONE 10 MG PO TABS: ORAL_TABLET | ORAL | 0 refills | Status: DC

## 2024-02-13 MED ORDER — ALBUTEROL SULFATE (2.5 MG/3ML) 0.083% IN NEBU: 2.5000 mg | INHALATION_SOLUTION | Freq: Four times a day (QID) | RESPIRATORY_TRACT | 3 refills | Status: DC | PRN

## 2024-02-13 NOTE — Telephone Encounter (Signed)
 Nursing team leads attempted to call last night and left a VM. The nursing supervisor attempted to call the patient's daughter this morning too and left a VM

## 2024-02-13 NOTE — Progress Notes (Signed)
 @Patient  ID: [name], female    DOB: 09-Oct-1940, 83 y.o.   MRN: [id number]  Chief Complaint  Patient presents with   Follow-up    No improvement in breathing.    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 She is a patient of Dr Lanny and last seen in office 01/22/2024. Past medical history significant for CAD s/p CABG, carotid artery disease, HTN, diastolic CHF, PVD, GERD, history of GI bleeds, HLD, IDA.   TEST/EVENTS:  08/26/2018 PFT: FVC 65, FEV1 58, ratio 66, TLC 81, DLCO 61. Positive BD 11/19/2021 echocardiogram: EF 60-65%, GIDD, RV nl, LA mildly dilated. Small pericardial effusion.  05/23/2022 CT chest with contrast: extensive atherosclerosis. S/p CABG. Mild emphysema with stable scarring. Stable treated RLL nodule. LLL nodule has resolved. Calcified lesion in the RUL. Lesion in left haptic lobe unchanged, benign. Chronic fx in the right 7th rib with incomplete healing and adjacent pleural thickening.  07/23/2022 NM stress test: no reversible ischemia or infarction. Normal EF 12/10/2023 PET scan: RLL opacity, stable with low level uptake. Stable distortion of posterolateral chest wall with pleural thickening and low level uptake. Focal uptake along anorectal region, physiologic variant  12/30/2023 echo: EF 65-70%. RV size and function nl. Mild MR  01/22/2024: OV with Dr. Shelah. Some question as to whether she needs to be on oxygen  at night while sleeping. ONO done while on oxygen , not on room air. Still able to exert herself, does some chores at home. She does desat with some work. She has daily cough, thick with mucus. Uses albuterol  1-2x/day. Last flare was 2 months ago, treated with pred and abx. Flares 3-4x/year. May need to consider additional therapies in the future.  02/13/2024: Today - follow up Discussed the use of AI scribe software for clinical note  transcription with the patient, who gave verbal consent to proceed.  History of Present Illness   [name] is an 83 year old female with COPD and emphysema who presents with difficulty breathing and lightheadedness. She is accompanied by her daughter.  She has been experiencing difficulty breathing and lightheadedness for about a week. Her oxygen  is set at three, but she still finds it hard to breathe. She describes episodes where she feels like she might pass out, particularly when standing up, and has had to use a cold rag on her head to alleviate symptoms. She questions why she is having these 'spells' and expresses uncertainty about whether her oxygen  levels are being maintained.  She recently visited her cardiologist due to feeling very unwell and was informed that her symptoms were related to her lungs rather than her heart.   No swelling in her legs, coughing, or wheezing. Her weight has remained stable. She has a history of COPD and emphysema, and she uses Trelegy once a day. She has nebulizer treatments available for emergencies but has not been using them regularly.  She underwent an overnight oxygen  study recently, using a watch and a finger device, and is awaiting results. She has had six blood transfusions in the past and expresses concern about potentially losing blood again, although she is not currently passing blood in her stool. She recalls a previous incident where her oxygen  levels dropped significantly to the 70's while standing and oxygen , which made her feel like she was going to pass out.       Allergies  Allergen  Reactions   Avelox [Moxifloxacin Hcl In Nacl] Palpitations and Other (See Comments)    Caused Heart Attack    Azithromycin  Swelling and Other (See Comments)    Patient reported past history of lip swelling   Codeine Other (See Comments)    Dr. Levern advised patient not to take this medication   Doxycycline  Swelling and Other (See Comments)    Mouth,  lips, feet swelling   Hydromorphone  Palpitations and Other (See Comments)    DILAUDID   -  Pt had a Heart Attack after taking Dilaudid .   Levaquin  [Levofloxacin ] Shortness Of Breath and Other (See Comments)    Chest pressure, SOB, pain in between shoulder blades, sweaty -as reported by patient per experience in ED this afternoon   Vitamin D Analogs Swelling and Other (See Comments)    Face and lips swell, but no breathing issues   Nifedipine Er Other (See Comments)    Dropped the heart rate too low   Octreotide      Chest pain, N/V   Zetia [Ezetimibe] Other (See Comments)    Made me feel like my face was going to sleep/numb   Oxycodone-Acetaminophen  Other (See Comments)    Says it makes her feel weird   Risedronate Other (See Comments)    Chest pain    Immunization History  Administered Date(s) Administered   Fluad Quad(high Dose 65+) 04/24/2018, 04/07/2019, 05/25/2020, 04/27/2021, 04/25/2022   Influenza Split 05/15/2012   Influenza Whole 05/13/2012   Influenza, High Dose Seasonal PF 05/22/2013, 07/13/2014, 04/24/2018, 05/13/2018, 04/07/2019   Influenza,inj,Quad PF,6+ Mos 05/15/2012, 05/15/2012, 04/14/2015, 05/18/2015, 05/18/2015, 04/10/2016, 04/10/2016, 04/30/2017, 04/30/2017, 04/24/2018   Influenza-Unspecified 05/15/2012, 05/22/2013, 07/13/2014, 05/18/2015, 04/10/2016   PFIZER Comirnaty(Gray Top)Covid-19 Tri-Sucrose Vaccine 03/29/2021   PFIZER(Purple Top)SARS-COV-2 Vaccination 09/28/2019, 10/20/2019, 06/17/2020   Pfizer Covid-19 Vaccine Bivalent Booster 62yrs & up 07/26/2021   Pfizer(Comirnaty)Fall Seasonal Vaccine 12 years and older 06/17/2023   Pneumococcal Conjugate-13 05/15/2012   Pneumococcal Polysaccharide-23 11/30/2014    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   Blood in stool    last  week of aug 2018   CAP (community acquired pneumonia) 10/22/2015   Chronic kidney disease    COPD (chronic obstructive pulmonary disease) (HCC)    Coronary artery disease    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   Diverticulitis    Dyspnea    with exertion   Gastroesophageal reflux disease    occ   Glaucoma    right eye   Headache syndrome 11/27/2018   Hiatal hernia    History of shingles 06/23/2018   Hyperlipidemia    Hypertension    Lung cancer (HCC) dx 2018   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   Myocardial infarction (HCC) 01/01/2000   Cardiac catheterization   Peripheral vascular disease (HCC)    stents in legs x 2 or 3   Pneumonia    last time winter 2017 -2018   PONV (postoperative nausea and vomiting)    occassionally, last colonscopy did ok with anesthesia   Primary cancer of right lower lobe of lung (HCC) 04/25/2016   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   TIA (transient ischemic attack) 02/11/2019   Wears dentures    Full set   Wears glasses     Tobacco History: Social History   Tobacco Use  Smoking  Status Former   Current packs/day: 0.00   Average packs/day: 1.5 packs/day for 30.0 years (45.0 ttl pk-yrs)   Types: Cigarettes   Start date: 08/13/1970   Quit date: 08/13/2000   Years since quitting: 23.5   Passive exposure: Never  Smokeless Tobacco Never   Counseling given: Not Answered   Outpatient Medications Prior to Visit  Medication Sig Dispense Refill   acetaminophen  (TYLENOL ) 500 MG tablet Take 500-1,000 mg by mouth every 6 (six) hours as needed (for pain).     albuterol  (VENTOLIN  HFA) 108 (90 Base) MCG/ACT inhaler Inhale 2 puffs into the lungs every 6 (six) hours as needed for wheezing or shortness of breath. 8 g 6   atorvastatin  (LIPITOR) 40 MG tablet Take 1 tablet (40 mg total) by mouth at bedtime. (Patient taking differently: Take 20 mg by mouth at bedtime.) 30 tablet 6   cetirizine  (ZYRTEC ) 10 MG tablet Take 10 mg by mouth  daily as needed for allergies.      clopidogrel  (PLAVIX ) 75 MG tablet Take 0.5 tablets (37.5 mg total) by mouth in the morning.     dexlansoprazole  (DEXILANT ) 60 MG capsule Take 60 mg by mouth daily before breakfast.     furosemide  (LASIX ) 40 MG tablet Take 20 mg by mouth in the morning.     magnesium  oxide (MAG-OX) 400 (240 Mg) MG tablet Take 400 mg by mouth daily after breakfast.     nitroGLYCERIN  (NITRODUR - DOSED IN MG/24 HR) 0.2 mg/hr patch Place 0.2 mg onto the skin daily. Patient wears patch for 12 hours and removes     nitroGLYCERIN  (NITROSTAT ) 0.4 MG SL tablet Place 1 tablet (0.4 mg total) under the tongue every 5 (five) minutes x 3 doses as needed for chest pain. 10 tablet 0   olmesartan  (BENICAR ) 20 MG tablet Take 1 tablet (20 mg total) by mouth daily. (Patient taking differently: Take 10 mg by mouth in the morning.) 90 tablet 1   Rimegepant Sulfate (NURTEC) 75 MG TBDP Take 1 tablet (75 mg total) by mouth as needed. 8 tablet 6   TRELEGY ELLIPTA  200-62.5-25 MCG/ACT AEPB USE 1 INHALATION BY MOUTH DAILY (Patient taking differently: Inhale 1 puff into the lungs daily.) 180 each 3   No facility-administered medications prior to visit.     Review of Systems:   Constitutional: No weight loss or gain, night sweats, fevers, chills, lassitude. +fatigue (baseline) HEENT: No headaches, difficulty swallowing, tooth/dental problems, or sore throat. No sneezing, itching, ear ache, nasal congestion, or post nasal drip CV:   +dizziness/lightheadedness. No PND, swelling in lower extremities, anasarca, palpitations, syncope, chest pain  Resp: +shortness of breath with exertion. No cough. No excess mucus or change in color of mucus. No hemoptysis. No wheezing.  No chest wall deformity GI:  No heartburn, indigestion, abdominal pain, vomiting, nausea, diarrhea, change in bowel habits, loss of appetite, bloody stools.  GU: No dysuria, change in color of urine, urgency or frequency.   Skin: No rash,  lesions, ulcerations MSK:  No joint pain or swelling.   Neuro: No gait abnormalities. No confusion.  Psych: No depression or anxiety. Mood stable.     Physical Exam:  BP (!) 130/50 (BP Location: Right Arm, Patient Position: Sitting, Cuff Size: Small)   Pulse 68   Ht 5' 2 (1.575 m)   Wt 140 lb 6.4 oz (63.7 kg)   SpO2 98%   BMI 25.68 kg/m   GEN: Pleasant, interactive, chronically-ill appearing; in no acute distress during exam HEENT:  Normocephalic and atraumatic. PERRLA. Sclera white. Nasal turbinates pink, moist and patent bilaterally. No rhinorrhea present. Oropharynx pink and moist, without exudate or edema. No lesions, ulcerations, or postnasal drip.  NECK:  Supple w/ fair ROM. No JVD present. Normal carotid impulses w/o bruits. Thyroid  symmetrical with no goiter or nodules palpated. No lymphadenopathy.   CV: RRR, no m/r/g, no peripheral edema. Pulses intact, +2 bilaterally. No cyanosis, pallor or clubbing. PULMONARY:  Unlabored, regular breathing. Clear bilaterally A&P w/o wheezes/rales/rhonchi. No accessory muscle use.  GI: BS present and normoactive. Soft, non-tender to palpation. No organomegaly or masses detected.  MSK: No erythema, warmth or tenderness. Cap refil <2 sec all extrem. No deformities or joint swelling noted.  Neuro: A/Ox3. No focal deficits noted.   Skin: Warm, no lesions or rashe Psych: Normal affect and behavior. Judgement and thought content appropriate.     Lab Results:  CBC    Component Value Date/Time   WBC 6.4 12/25/2023 0921   WBC 6.3 11/19/2023 1913   RBC 3.31 (L) 12/25/2023 0921   HGB 9.5 (L) 12/25/2023 0921   HGB 9.9 (L) 08/02/2017 0821   HCT 29.7 (L) 12/25/2023 0921   HCT 29.8 (L) 08/02/2017 0821   PLT 255 12/25/2023 0921   PLT 218 08/02/2017 0821   MCV 89.7 12/25/2023 0921   MCV 92.3 08/02/2017 0821   MCH 28.7 12/25/2023 0921   MCHC 32.0 12/25/2023 0921   RDW 12.9 12/25/2023 0921   RDW 14.4 08/02/2017 0821   LYMPHSABS 2.0  12/25/2023 0921   LYMPHSABS 2.6 08/02/2017 0821   MONOABS 0.5 12/25/2023 0921   MONOABS 0.5 08/02/2017 0821   EOSABS 0.6 (H) 12/25/2023 0921   EOSABS 0.2 08/02/2017 0821   BASOSABS 0.0 12/25/2023 0921   BASOSABS 0.0 08/02/2017 0821    BMET    Component Value Date/Time   NA 141 11/19/2023 1913   NA 139 08/02/2017 0821   K 4.3 11/19/2023 1913   K 4.3 08/02/2017 0821   CL 108 11/19/2023 1913   CO2 24 11/19/2023 1913   CO2 24 08/02/2017 0821   GLUCOSE 117 (H) 11/19/2023 1913   GLUCOSE 100 08/02/2017 0821   BUN 31 (H) 11/19/2023 1913   BUN 14.4 08/02/2017 0821   CREATININE 1.71 (H) 11/19/2023 1913   CREATININE 1.96 (H) 05/23/2023 1510   CREATININE 1.3 (H) 08/02/2017 0821   CALCIUM  9.3 11/19/2023 1913   CALCIUM  9.2 08/02/2017 0821   GFRNONAA 30 (L) 11/19/2023 1913   GFRNONAA 25 (L) 05/23/2023 1510   GFRAA 34 (L) 02/26/2020 0842    BNP    Component Value Date/Time   BNP 68.0 09/10/2022 0825     Imaging:  No results found.  Administration History     None          Latest Ref Rng & Units 03/08/2023    2:27 PM 08/26/2018   12:59 PM 02/14/2018   11:18 AM 04/10/2016    9:11 AM  PFT Results  FVC-Pre L 1.84  1.69  1.80  P 1.53   FVC-Predicted Pre % 77  65  69  P 57   FVC-Post L 1.96  1.91  1.99  P 1.82   FVC-Predicted Post % 82  74  77  P 69   Pre FEV1/FVC % % 65  67  72  P 71   Post FEV1/FCV % % 67  66  72  P 70   FEV1-Pre L 1.20  1.12  1.30  P 1.09  FEV1-Predicted Pre % 68  58  67  P 54   FEV1-Post L 1.31  1.26  1.43  P 1.28   DLCO uncorrected ml/min/mmHg 11.14  11.93   11.07   DLCO UNC% % 63  55   51   DLCO corrected ml/min/mmHg 11.14  13.35     DLCO COR %Predicted % 63  61     DLVA Predicted % 91  97   86   TLC L 4.10  3.85   3.77   TLC % Predicted % 86  81   79   RV % Predicted % 98  94   96     P Preliminary result    No results found for: NITRICOXIDE      Assessment & Plan:   DOE (dyspnea on exertion) Dyspnea associated with presyncope.  Recent cardiac evaluation with echo unremarkable. Reports hypoxia despite supplemental O2. Exertional walk test today with forehead probe on 3 lpm POC without any desaturations. Pt's pulse ox from home had inaccurate readings - will place order for forehead probe oximeter for home use.  Unclear etiology. Lung exam clear. Euvolemic on exam. Will check BNP/BMET to assess for cardiac component. CBC to rule out anemia. D dimer to rule out PE as she is higher risk in setting of malignancy. CXR to rule out acute process. Strict ED precautions. Close follow up.  Patient Instructions  Continue Trelegy 1 puff daily. Brush tongue and rinse mouth afterwards Continue Albuterol  inhaler 2 puffs or 3 mL neb every 6 hours as needed for shortness of breath or wheezing. Notify if symptoms persist despite rescue inhaler/neb use. Use nebs 2-3 times a day until symptoms improve    Prednisone  taper. 4 tabs for 2 days, then 3 tabs for 2 days, 2 tabs for 2 days, then 1 tab for 2 days, then stop. Take in AM with food  Chest x ray and labs today  If breathing gets worse, go to the hospital  Continue oxygen  2-3 lpm for goal >88-90%    Follow up in 2-3 weeks with Dr. Shelah or Katie Brigid Vandekamp,NP. If symptoms worsen, please contact office for sooner follow up or seek emergency care.    Chronic respiratory failure with hypoxia (HCC) Stable without increased O2 requirement. SpO2 low 97% on 3 lpm POC. See above plan. Goal >88-90%. Awaiting ONO on room air results - will reach out to DME  COPD Question COPD component; although lung exam clear. Will challenge her with prednisone  taper. See above plan. Continue triple therapy regimen. Action plan in place. Consider addition of ohtuvayre  pending symptoms and results.   Primary cancer of right lower lobe of lung (HCC) Stable on recent imaging. Follow up with oncology as scheduled     I spent 45 minutes of dedicated to the care of this patient on the date of this encounter to  include pre-visit review of records, face-to-face time with the patient discussing conditions above, post visit ordering of testing, clinical documentation with the electronic health record, making appropriate referrals as documented, and communicating necessary findings to members of the patients care team.  Comer LULLA Rouleau, NP 02/13/2024  Pt aware and understands NP's role.

## 2024-02-13 NOTE — Assessment & Plan Note (Signed)
 Stable on recent imaging. Follow up with oncology as scheduled

## 2024-02-13 NOTE — Assessment & Plan Note (Addendum)
 Stable without increased O2 requirement. SpO2 low 97% on 3 lpm POC. See above plan. Goal >88-90%. Awaiting ONO on room air results - will reach out to DME

## 2024-02-13 NOTE — Telephone Encounter (Unsigned)
 Copied from CRM [id number]. Topic: Complaint (DO NOT CONVERT) - Staff >> Feb 12, 2024  4:09 PM Rozanna G wrote: Date of Incident: 02/12/2024 Details of complaint: STATED CALL TODAY ABOUT BREATHING STATED FEMALE REP OR NURSE TOLD THAT THE APPT WAS TODAY AND THEY WENT BECAUSE HE TOLD HER THE APPT WAS TODAY AT 330 AND TO BE THERE AT 3 BUT IT IS SCHEDULED FOR 07/03 How would the patient like to see it resolved? WOULD LIKE A CALL FROM THE OFFICE MANAGER OR SOMEONE  On a scale of 1-10, how was your experience? STATED ZERO  What would it take to bring it to a 10? SOMEONE TO MAKE SURE HER MOM IS TAKEN CARE OF   Route to Research officer, political party. >> Feb 12, 2024  4:46 PM Rozanna MATSU wrote: PLEASE CONTACT LORI AT [phone] [facility] IS THE DAUGHTER OF PT.

## 2024-02-13 NOTE — Assessment & Plan Note (Signed)
 Dyspnea associated with presyncope. Recent cardiac evaluation with echo unremarkable. Reports hypoxia despite supplemental O2. Exertional walk test today with forehead probe on 3 lpm POC without any desaturations. Pt's pulse ox from home had inaccurate readings - will place order for forehead probe oximeter for home use.  Unclear etiology. Lung exam clear. Euvolemic on exam. Will check BNP/BMET to assess for cardiac component. CBC to rule out anemia. D dimer to rule out PE as she is higher risk in setting of malignancy. CXR to rule out acute process. Strict ED precautions. Close follow up.  Patient Instructions  Continue Trelegy 1 puff daily. Brush tongue and rinse mouth afterwards Continue Albuterol  inhaler 2 puffs or 3 mL neb every 6 hours as needed for shortness of breath or wheezing. Notify if symptoms persist despite rescue inhaler/neb use. Use nebs 2-3 times a day until symptoms improve    Prednisone  taper. 4 tabs for 2 days, then 3 tabs for 2 days, 2 tabs for 2 days, then 1 tab for 2 days, then stop. Take in AM with food  Chest x ray and labs today  If breathing gets worse, go to the hospital  Continue oxygen  2-3 lpm for goal >88-90%    Follow up in 2-3 weeks with Dr. Shelah or Katie Courtni Balash,NP. If symptoms worsen, please contact office for sooner follow up or seek emergency care.

## 2024-02-13 NOTE — Assessment & Plan Note (Signed)
 Question COPD component; although lung exam clear. Will challenge her with prednisone  taper. See above plan. Continue triple therapy regimen. Action plan in place. Consider addition of ohtuvayre  pending symptoms and results.

## 2024-02-13 NOTE — Telephone Encounter (Signed)
 Message sent to [employer] supervisors to contact patient.

## 2024-02-13 NOTE — Patient Instructions (Addendum)
 Continue Trelegy 1 puff daily. Brush tongue and rinse mouth afterwards Continue Albuterol  inhaler 2 puffs or 3 mL neb every 6 hours as needed for shortness of breath or wheezing. Notify if symptoms persist despite rescue inhaler/neb use. Use nebs 2-3 times a day until symptoms improve    Prednisone  taper. 4 tabs for 2 days, then 3 tabs for 2 days, 2 tabs for 2 days, then 1 tab for 2 days, then stop. Take in AM with food  Chest x ray and labs today  If breathing gets worse, go to the hospital  Continue oxygen  2-3 lpm for goal >88-90%    Follow up in 2-3 weeks with Dr. Shelah or Katie Miki Blank,NP. If symptoms worsen, please contact office for sooner follow up or seek emergency care.

## 2024-02-14 ENCOUNTER — Encounter (HOSPITAL_COMMUNITY): Payer: Self-pay

## 2024-02-14 ENCOUNTER — Inpatient Hospital Stay (HOSPITAL_COMMUNITY)
Admission: EM | Admit: 2024-02-14 | Discharge: 2024-02-18 | DRG: 811 | Disposition: A | Discharge status: Home / Self Care | Attending: Internal Medicine | Admitting: Internal Medicine

## 2024-02-14 ENCOUNTER — Inpatient Hospital Stay (HOSPITAL_COMMUNITY)

## 2024-02-14 ENCOUNTER — Other Ambulatory Visit: Payer: Self-pay

## 2024-02-14 DIAGNOSIS — R1031 Right lower quadrant pain: Secondary | ICD-10-CM | POA: Diagnosis not present

## 2024-02-14 DIAGNOSIS — K31819 Angiodysplasia of stomach and duodenum without bleeding: Secondary | ICD-10-CM | POA: Diagnosis not present

## 2024-02-14 DIAGNOSIS — Z79899 Other long term (current) drug therapy: Secondary | ICD-10-CM

## 2024-02-14 DIAGNOSIS — Z66 Do not resuscitate: Secondary | ICD-10-CM | POA: Diagnosis present

## 2024-02-14 DIAGNOSIS — Z8673 Personal history of transient ischemic attack (TIA), and cerebral infarction without residual deficits: Secondary | ICD-10-CM

## 2024-02-14 DIAGNOSIS — Z7951 Long term (current) use of inhaled steroids: Secondary | ICD-10-CM

## 2024-02-14 DIAGNOSIS — I739 Peripheral vascular disease, unspecified: Secondary | ICD-10-CM | POA: Diagnosis present

## 2024-02-14 DIAGNOSIS — Z87891 Personal history of nicotine dependence: Secondary | ICD-10-CM

## 2024-02-14 DIAGNOSIS — Z8249 Family history of ischemic heart disease and other diseases of the circulatory system: Secondary | ICD-10-CM

## 2024-02-14 DIAGNOSIS — D62 Acute posthemorrhagic anemia: Secondary | ICD-10-CM | POA: Diagnosis not present

## 2024-02-14 DIAGNOSIS — R42 Dizziness and giddiness: Secondary | ICD-10-CM | POA: Diagnosis present

## 2024-02-14 DIAGNOSIS — H409 Unspecified glaucoma: Secondary | ICD-10-CM | POA: Diagnosis present

## 2024-02-14 DIAGNOSIS — D649 Anemia, unspecified: Secondary | ICD-10-CM | POA: Diagnosis not present

## 2024-02-14 DIAGNOSIS — Z85118 Personal history of other malignant neoplasm of bronchus and lung: Secondary | ICD-10-CM

## 2024-02-14 DIAGNOSIS — Z9981 Dependence on supplemental oxygen: Secondary | ICD-10-CM | POA: Diagnosis not present

## 2024-02-14 DIAGNOSIS — E785 Hyperlipidemia, unspecified: Secondary | ICD-10-CM | POA: Diagnosis present

## 2024-02-14 DIAGNOSIS — R062 Wheezing: Secondary | ICD-10-CM

## 2024-02-14 DIAGNOSIS — I2510 Atherosclerotic heart disease of native coronary artery without angina pectoris: Secondary | ICD-10-CM | POA: Diagnosis present

## 2024-02-14 DIAGNOSIS — D509 Iron deficiency anemia, unspecified: Secondary | ICD-10-CM | POA: Diagnosis not present

## 2024-02-14 DIAGNOSIS — Z832 Family history of diseases of the blood and blood-forming organs and certain disorders involving the immune mechanism: Secondary | ICD-10-CM

## 2024-02-14 DIAGNOSIS — R55 Syncope and collapse: Secondary | ICD-10-CM | POA: Diagnosis present

## 2024-02-14 DIAGNOSIS — I130 Hypertensive heart and chronic kidney disease with heart failure and stage 1 through stage 4 chronic kidney disease, or unspecified chronic kidney disease: Secondary | ICD-10-CM | POA: Diagnosis not present

## 2024-02-14 DIAGNOSIS — Z95820 Peripheral vascular angioplasty status with implants and grafts: Secondary | ICD-10-CM

## 2024-02-14 DIAGNOSIS — J9611 Chronic respiratory failure with hypoxia: Secondary | ICD-10-CM | POA: Diagnosis present

## 2024-02-14 DIAGNOSIS — Z823 Family history of stroke: Secondary | ICD-10-CM

## 2024-02-14 DIAGNOSIS — Z83438 Family history of other disorder of lipoprotein metabolism and other lipidemia: Secondary | ICD-10-CM

## 2024-02-14 DIAGNOSIS — K219 Gastro-esophageal reflux disease without esophagitis: Secondary | ICD-10-CM | POA: Diagnosis present

## 2024-02-14 DIAGNOSIS — Z923 Personal history of irradiation: Secondary | ICD-10-CM | POA: Diagnosis not present

## 2024-02-14 DIAGNOSIS — N184 Chronic kidney disease, stage 4 (severe): Secondary | ICD-10-CM | POA: Diagnosis not present

## 2024-02-14 DIAGNOSIS — Z7902 Long term (current) use of antithrombotics/antiplatelets: Secondary | ICD-10-CM | POA: Diagnosis not present

## 2024-02-14 DIAGNOSIS — I5031 Acute diastolic (congestive) heart failure: Secondary | ICD-10-CM | POA: Diagnosis not present

## 2024-02-14 DIAGNOSIS — K5521 Angiodysplasia of colon with hemorrhage: Secondary | ICD-10-CM | POA: Diagnosis present

## 2024-02-14 DIAGNOSIS — I10 Essential (primary) hypertension: Secondary | ICD-10-CM | POA: Diagnosis not present

## 2024-02-14 DIAGNOSIS — K922 Gastrointestinal hemorrhage, unspecified: Principal | ICD-10-CM

## 2024-02-14 DIAGNOSIS — I252 Old myocardial infarction: Secondary | ICD-10-CM | POA: Diagnosis not present

## 2024-02-14 DIAGNOSIS — Z951 Presence of aortocoronary bypass graft: Secondary | ICD-10-CM

## 2024-02-14 DIAGNOSIS — Z9049 Acquired absence of other specified parts of digestive tract: Secondary | ICD-10-CM

## 2024-02-14 DIAGNOSIS — N179 Acute kidney failure, unspecified: Secondary | ICD-10-CM | POA: Diagnosis present

## 2024-02-14 DIAGNOSIS — R195 Other fecal abnormalities: Secondary | ICD-10-CM | POA: Diagnosis not present

## 2024-02-14 DIAGNOSIS — J4489 Other specified chronic obstructive pulmonary disease: Secondary | ICD-10-CM | POA: Diagnosis present

## 2024-02-14 DIAGNOSIS — Z825 Family history of asthma and other chronic lower respiratory diseases: Secondary | ICD-10-CM

## 2024-02-14 DIAGNOSIS — Z8619 Personal history of other infectious and parasitic diseases: Secondary | ICD-10-CM

## 2024-02-14 DIAGNOSIS — J441 Chronic obstructive pulmonary disease with (acute) exacerbation: Secondary | ICD-10-CM

## 2024-02-14 DIAGNOSIS — N1832 Chronic kidney disease, stage 3b: Secondary | ICD-10-CM | POA: Diagnosis present

## 2024-02-14 DIAGNOSIS — K5520 Angiodysplasia of colon without hemorrhage: Secondary | ICD-10-CM | POA: Diagnosis not present

## 2024-02-14 DIAGNOSIS — E875 Hyperkalemia: Secondary | ICD-10-CM | POA: Diagnosis not present

## 2024-02-14 DIAGNOSIS — I129 Hypertensive chronic kidney disease with stage 1 through stage 4 chronic kidney disease, or unspecified chronic kidney disease: Secondary | ICD-10-CM | POA: Diagnosis present

## 2024-02-14 DIAGNOSIS — Z947 Corneal transplant status: Secondary | ICD-10-CM

## 2024-02-14 DIAGNOSIS — Z7952 Long term (current) use of systemic steroids: Secondary | ICD-10-CM

## 2024-02-14 LAB — BASIC METABOLIC PANEL WITH GFR
BUN/Creatinine Ratio: 16 (calc) (ref 6–22)
BUN: 42 mg/dL — ABNORMAL HIGH (ref 7–25)
CO2: 24 mmol/L (ref 20–32)
Calcium: 9.1 mg/dL (ref 8.6–10.4)
Chloride: 103 mmol/L (ref 98–110)
Creat: 2.6 mg/dL — ABNORMAL HIGH (ref 0.60–0.95)
Glucose, Bld: 103 mg/dL — ABNORMAL HIGH (ref 65–99)
Potassium: 4.8 mmol/L (ref 3.5–5.3)
Sodium: 138 mmol/L (ref 135–146)
eGFR: 18 mL/min/1.73m2 — ABNORMAL LOW (ref >=60)

## 2024-02-14 LAB — COMPREHENSIVE METABOLIC PANEL WITH GFR
ALT: 8 U/L (ref 0–44)
AST: 19 U/L (ref 15–41)
Albumin: 3.7 g/dL (ref 3.5–5.0)
Alkaline Phosphatase: 62 U/L (ref 38–126)
Anion gap: 10 (ref 5–15)
BUN: 43 mg/dL — ABNORMAL HIGH (ref 8–23)
CO2: 23 mmol/L (ref 22–32)
Calcium: 9 mg/dL (ref 8.9–10.3)
Chloride: 109 mmol/L (ref 98–111)
Creatinine, Ser: 2.42 mg/dL — ABNORMAL HIGH (ref 0.44–1.00)
GFR, Estimated: 20 mL/min — ABNORMAL LOW (ref >60)
Glucose, Bld: 98 mg/dL (ref 70–99)
Potassium: 4.2 mmol/L (ref 3.5–5.1)
Sodium: 142 mmol/L (ref 135–145)
Total Bilirubin: 0.6 mg/dL (ref 0.0–1.2)
Total Protein: 6.9 g/dL (ref 6.5–8.1)

## 2024-02-14 LAB — CBC WITH DIFFERENTIAL/PLATELET
Absolute Lymphocytes: 2614 {cells}/uL (ref 850–3900)
Absolute Monocytes: 608 {cells}/uL (ref 200–950)
Basophils Absolute: 61 {cells}/uL (ref 0–200)
Basophils Relative: 0.8 %
Eosinophils Absolute: 403 {cells}/uL (ref 15–500)
Eosinophils Relative: 5.3 %
HCT: 24.9 % — ABNORMAL LOW (ref 35.0–45.0)
Hemoglobin: 7.9 g/dL — ABNORMAL LOW (ref 11.7–15.5)
MCH: 28.4 pg (ref 27.0–33.0)
MCHC: 31.7 g/dL — ABNORMAL LOW (ref 32.0–36.0)
MCV: 89.6 fL (ref 80.0–100.0)
MPV: 10.5 fL (ref 7.5–12.5)
Monocytes Relative: 8 %
Neutro Abs: 3914 {cells}/uL (ref 1500–7800)
Neutrophils Relative %: 51.5 %
Platelets: 285 Thousand/uL (ref 140–400)
RBC: 2.78 Million/uL — ABNORMAL LOW (ref 3.80–5.10)
RDW: 14.1 % (ref 11.0–15.0)
Total Lymphocyte: 34.4 %
WBC: 7.6 Thousand/uL (ref 3.8–10.8)

## 2024-02-14 LAB — HEMOGLOBIN AND HEMATOCRIT, BLOOD
HCT: 26.5 % — ABNORMAL LOW (ref 36.0–46.0)
Hemoglobin: 8.8 g/dL — ABNORMAL LOW (ref 12.0–15.0)

## 2024-02-14 LAB — CBC
HCT: 24.3 % — ABNORMAL LOW (ref 36.0–46.0)
Hemoglobin: 7.5 g/dL — ABNORMAL LOW (ref 12.0–15.0)
MCH: 28 pg (ref 26.0–34.0)
MCHC: 30.9 g/dL (ref 30.0–36.0)
MCV: 90.7 fL (ref 80.0–100.0)
Platelets: 266 K/uL (ref 150–400)
RBC: 2.68 MIL/uL — ABNORMAL LOW (ref 3.87–5.11)
RDW: 14.9 % (ref 11.5–15.5)
WBC: 6.1 K/uL (ref 4.0–10.5)
nRBC: 0 % (ref 0.0–0.2)

## 2024-02-14 LAB — PREPARE RBC (CROSSMATCH)

## 2024-02-14 LAB — POC OCCULT BLOOD, ED: Fecal Occult Bld: POSITIVE — AB

## 2024-02-14 LAB — BRAIN NATRIURETIC PEPTIDE: Brain Natriuretic Peptide: 117 pg/mL — ABNORMAL HIGH (ref <100)

## 2024-02-14 LAB — D-DIMER, QUANTITATIVE: D-Dimer, Quant: 0.62 ug{FEU}/mL — ABNORMAL HIGH (ref <0.50)

## 2024-02-14 MED ORDER — ONDANSETRON HCL 4 MG/2ML IJ SOLN: 4.0000 mg | Freq: Four times a day (QID) | INTRAMUSCULAR | Status: DC | PRN

## 2024-02-14 MED ORDER — ATORVASTATIN CALCIUM 20 MG PO TABS
20.0000 mg | ORAL_TABLET | Freq: Every day | ORAL | Status: DC
  Administered 2024-02-14 – 2024-02-17 (×4): 20 mg via ORAL
  Filled 2024-02-14 (×4): qty 1

## 2024-02-14 MED ORDER — TRAZODONE HCL 50 MG PO TABS
25.0000 mg | ORAL_TABLET | Freq: Every evening | ORAL | Status: DC | PRN
  Filled 2024-02-14: qty 1

## 2024-02-14 MED ORDER — ALBUTEROL SULFATE (2.5 MG/3ML) 0.083% IN NEBU
2.5000 mg | INHALATION_SOLUTION | RESPIRATORY_TRACT | Status: DC | PRN
  Administered 2024-02-16: 2.5 mg via RESPIRATORY_TRACT
  Filled 2024-02-14: qty 3

## 2024-02-14 MED ORDER — PANTOPRAZOLE SODIUM 40 MG PO TBEC
40.0000 mg | DELAYED_RELEASE_TABLET | Freq: Every day | ORAL | Status: DC
  Administered 2024-02-14 – 2024-02-18 (×4): 40 mg via ORAL
  Filled 2024-02-14 (×4): qty 1

## 2024-02-14 MED ORDER — SODIUM CHLORIDE 0.9% IV SOLUTION: Freq: Once | INTRAVENOUS | Status: AC

## 2024-02-14 MED ORDER — ACETAMINOPHEN 325 MG PO TABS
650.0000 mg | ORAL_TABLET | Freq: Four times a day (QID) | ORAL | Status: DC | PRN
  Administered 2024-02-14 – 2024-02-15 (×4): 650 mg via ORAL
  Filled 2024-02-14 (×4): qty 2

## 2024-02-14 MED ORDER — AMLODIPINE BESYLATE 5 MG PO TABS: 2.5000 mg | ORAL_TABLET | Freq: Every day | ORAL | Status: DC

## 2024-02-14 MED ORDER — SODIUM CHLORIDE 0.9 % IV BOLUS
500.0000 mL | Freq: Once | INTRAVENOUS | Status: AC
  Administered 2024-02-14: 500 mL via INTRAVENOUS

## 2024-02-14 MED ORDER — HYDRALAZINE HCL 20 MG/ML IJ SOLN
5.0000 mg | Freq: Four times a day (QID) | INTRAMUSCULAR | Status: DC | PRN
  Administered 2024-02-14 – 2024-02-16 (×2): 5 mg via INTRAVENOUS
  Filled 2024-02-14 (×2): qty 1

## 2024-02-14 MED ORDER — ACETAMINOPHEN 650 MG RE SUPP: 650.0000 mg | Freq: Four times a day (QID) | RECTAL | Status: DC | PRN

## 2024-02-14 MED ORDER — ONDANSETRON HCL 4 MG PO TABS: 4.0000 mg | ORAL_TABLET | Freq: Four times a day (QID) | ORAL | Status: DC | PRN

## 2024-02-14 NOTE — ED Triage Notes (Addendum)
 Patient had labs drawn yesterday. Was told her hemoglobin was 7.9. Denies bleeding anywhere, but said she usually has blood in her stool after the tests are ran. Feels cold and fatigued. Takes plavix .

## 2024-02-14 NOTE — Consult Note (Signed)
 Referring Provider:   Thom Fetters, [facility]        Primary Care Physician:  Pura Lenis, MD Primary Gastroenterologist: Dr. Belvie Just            Reason for Consultation: Recurrent symptomatic anemia with history of GI blood loss                  ASSESSMENT /  PLAN   83 y.o. female with a GI history pertinent for recurrent GI blood loss secondary to small intestinal angioectasias requiring previous ablation, history of aortoiliac occlusive disease, COPD, carotid artery stenosis, CKD stage III, prior CABG, lung cancer status post radiation admitted with recurrent symptomatic anemia and heme positive stool plus right lower quadrant abdominal pain  1.  Small bowel angioectasias/heme positive stool and symptomatic anemia --given her history this is very likely recurrent small bowel angioectasias leading to chronic blood loss to the point where she has become symptomatic from anemia.  She is on Plavix  37.5 mg daily -- Stop Plavix ; last dose 02/13/2024 -- Blood transfusions have been ordered x 2, follow-up response -- This is expected to be slow GI bleeding and not overt -- Eventual small bowel enteroscopy with monitored anesthesia care; will follow-up response to transfusion and let Plavix  washout a bit longer -- Okay for regular diet after CT, see #2 -- Dr. Just to assume care on Monday  2.  Right lower quadrant abdominal pain --significant tenderness on exam has been bothering her on and off for the last week -- CT scan abdomen pelvis; no IV contrast due to AKI  3.  Coronary and peripheral vascular disease --on Plavix  37.5 mg daily -- Hold Plavix , see #1  4.  Acute on chronic kidney injury --defer to hospitalist medicine, expect some prerenal insult with anemia  5.  History of IDA secondary to #1  -- Repeat ferritin and IBC panel  GI will follow    HPI:     [name] is a 83 y.o. female with a GI history pertinent for recurrent GI blood loss secondary  to small intestinal angioectasias requiring previous ablation, history of aortoiliac occlusive disease, COPD, carotid artery stenosis, CKD stage III, prior CABG, lung cancer status post radiation admitted with recurrent symptomatic anemia.  She was seen by [facility] pulmonary yesterday for follow-up where she reported difficulty with breathing and lightheadedness for about a week.  She was having presyncopal type symptoms.  Pulmonary recommended lab work, D-dimer and a chest x-ray.  She reports that her stools have been dark but not necessarily different than normal for her.  No black tarry stools and no red blood or visible bleeding. She does not take oral iron  but gets IV iron  periodically from Dr. Gatha  She has noticed right lower quadrant pain on and off seem to be worse with fresh fruits and vegetables like salads. Has been hurting again the last several days in the right lower quadrant No nausea, no vomiting  She takes a half dose of Plavix , 37.5 mg daily.  Her last dose was evening 02/13/2024  Hemoglobin at pulmonary was found to be 7.9 down from 9.5 on 12/25/2023) D-dimer was 0.62 which was stable for her. Metabolic panel revealed acute on chronic kidney injury; creatinine 2.67 been 1.71-1.88 in April and January 2025, respectively  In the ER today her hemoglobin is 7.5 Stools were Hemoccult positive  She was last hospitalized and seen in consult in October 2024 In May 2023 she had  an endoscopically deployed video capsule endoscopy --VCE was normal and 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   Aortoiliac occlusive disease  (HCC)    Arnold-Chiari malformation (HCC) 1998   Asthma    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   Blood in stool    last week of aug 2018   CAP (community acquired pneumonia) 10/22/2015   Chronic kidney disease    COPD (chronic obstructive pulmonary disease) (HCC)    Coronary artery disease    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   Diverticulitis    Dyspnea    with exertion   Gastroesophageal reflux disease    occ   Glaucoma    right eye   Headache syndrome 11/27/2018   Hiatal hernia    History of shingles 06/23/2018   Hyperlipidemia    Hypertension    Lung cancer (HCC) dx 2018   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   Myocardial infarction (HCC) 01/01/2000   Cardiac catheterization   Peripheral vascular disease (HCC)    stents in legs x 2 or 3   Pneumonia    last time winter 2017 -2018   PONV (postoperative nausea and vomiting)    occassionally, last colonscopy did ok with anesthesia   Primary cancer of right lower lobe of lung (HCC) 04/25/2016   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   TIA (transient ischemic attack) 02/11/2019   Wears dentures    Full set   Wears glasses     Past Surgical History:  Procedure Laterality Date   ABDOMINAL HYSTERECTOMY     APPENDECTOMY     Arnold-chiari malformation repair  1998   Suboccipital craniectomy   CAROTID ENDARTERECTOMY  03/29/2010   Left  CEA   CHOLECYSTECTOMY     Gall Bladder   COLONOSCOPY WITH PROPOFOL  N/A 04/22/2015   Procedure: COLONOSCOPY WITH PROPOFOL ;  Surgeon: Belvie Rollin, MD;  Location: [facility];  Service: Endoscopy;  Laterality: N/A;   COLONOSCOPY WITH PROPOFOL  N/A 05/25/2016   Procedure: COLONOSCOPY WITH PROPOFOL ;  Surgeon: Belvie Rollin, MD;  Location: [facility];  Service: Endoscopy;  Laterality: N/A;   COLONOSCOPY WITH PROPOFOL  N/A 05/03/2017   Procedure: COLONOSCOPY WITH PROPOFOL ;  Surgeon: Rollin Belvie, MD;  Location: [facility];  Service: Endoscopy;  Laterality: N/A;    COLONOSCOPY WITH PROPOFOL  N/A 06/29/2020   Procedure: COLONOSCOPY WITH PROPOFOL ;  Surgeon: Rollin Belvie, MD;  Location: [facility];  Service: Endoscopy;  Laterality: N/A;   COLONOSCOPY WITH PROPOFOL  N/A 01/05/2021   Procedure: COLONOSCOPY WITH PROPOFOL ;  Surgeon: Rollin Belvie, MD;  Location: [facility];  Service: Endoscopy;  Laterality: N/A;   COLONOSCOPY WITH PROPOFOL  N/A 09/28/2021   Procedure: COLONOSCOPY WITH PROPOFOL ;  Surgeon: Rollin Belvie, MD;  Location: [facility] ENDOSCOPY;  Service: Endoscopy;  Laterality: N/A;   CORNEAL TRANSPLANT     Right   CORONARY ARTERY BYPASS GRAFT  01/01/2000   x 3   ENDARTERECTOMY Right 09/26/2021   Procedure: RIGHT CAROTID ENDARTERECTOMY;  Surgeon: Eliza Lonni RAMAN, MD;  Location: [facility] OR;  Service: Vascular;  Laterality: Right;   ENTEROSCOPY N/A 02/27/2018   Procedure: ENTEROSCOPY;  Surgeon: Rollin Belvie, MD;  Location: [facility];  Service: Endoscopy;  Laterality: N/A;   ENTEROSCOPY N/A 07/18/2018   Procedure: ENTEROSCOPY;  Surgeon: Rollin Dover, MD;  Location: [facility];  Service: Endoscopy;  Laterality: N/A;   ENTEROSCOPY N/A 06/29/2020   Procedure: ENTEROSCOPY;  Surgeon: Rollin Dover, MD;  Location: [facility];  Service: Endoscopy;  Laterality: N/A;   ENTEROSCOPY N/A 12/05/2021   Procedure: ENTEROSCOPY;  Surgeon: Rollin Dover, MD;  Location: [facility];  Service: Gastroenterology;  Laterality: N/A;   ENTEROSCOPY N/A 12/25/2021   Procedure: ENTEROSCOPY;  Surgeon: Rollin Dover, MD;  Location: [facility];  Service: Gastroenterology;  Laterality: N/A;  with endoscopic placement of video capsule   ESOPHAGOGASTRODUODENOSCOPY N/A 05/25/2016   Procedure: ESOPHAGOGASTRODUODENOSCOPY (EGD);  Surgeon: Dover Rollin, MD;  Location: [facility];  Service: Endoscopy;  Laterality: N/A;   ESOPHAGOGASTRODUODENOSCOPY N/A 08/01/2018   Procedure: ESOPHAGOGASTRODUODENOSCOPY (EGD);  Surgeon: Rollin Dover, MD;  Location: [facility];  Service: Endoscopy;   Laterality: N/A;   ESOPHAGOGASTRODUODENOSCOPY (EGD) WITH PROPOFOL  N/A 09/28/2021   Procedure: ESOPHAGOGASTRODUODENOSCOPY (EGD) WITH PROPOFOL ;  Surgeon: Rollin Dover, MD;  Location: [facility] ENDOSCOPY;  Service: Endoscopy;  Laterality: N/A;   EYE SURGERY Right 1995 or 1996   Laser surgery for retinal hemorrhage   GIVENS CAPSULE STUDY N/A 07/16/2018   Procedure: GIVENS CAPSULE STUDY;  Surgeon: Rollin Dover, MD;  Location: [facility];  Service: Endoscopy;  Laterality: N/A;   GIVENS CAPSULE STUDY N/A 12/14/2021   Procedure: GIVENS CAPSULE STUDY;  Surgeon: Rollin Dover, MD;  Location: [facility] ENDOSCOPY;  Service: Gastroenterology;  Laterality: N/A;   GIVENS CAPSULE STUDY N/A 12/25/2021   Procedure: GIVENS CAPSULE STUDY;  Surgeon: Rollin Dover, MD;  Location: [facility];  Service: Gastroenterology;  Laterality: N/A;   HEMOSTASIS CLIP PLACEMENT  06/29/2020   Procedure: HEMOSTASIS CLIP PLACEMENT;  Surgeon: Rollin Dover, MD;  Location: [facility];  Service: Endoscopy;;   HEMOSTASIS CLIP PLACEMENT  01/05/2021   Procedure: HEMOSTASIS CLIP PLACEMENT;  Surgeon: Rollin Dover, MD;  Location: [facility];  Service: Endoscopy;;   HOT HEMOSTASIS N/A 02/27/2018   Procedure: HOT HEMOSTASIS (ARGON PLASMA COAGULATION/BICAP);  Surgeon: Rollin Dover, MD;  Location: [facility];  Service: Endoscopy;  Laterality: N/A;   HOT HEMOSTASIS N/A 08/01/2018   Procedure: HOT HEMOSTASIS (ARGON PLASMA COAGULATION/BICAP);  Surgeon: Rollin Dover, MD;  Location: [facility];  Service: Endoscopy;  Laterality: N/A;   HOT HEMOSTASIS N/A 06/29/2020   Procedure: HOT HEMOSTASIS (ARGON PLASMA COAGULATION/BICAP);  Surgeon: Rollin Dover, MD;  Location: [facility];  Service: Endoscopy;  Laterality: N/A;   HOT HEMOSTASIS N/A 01/05/2021   Procedure: HOT HEMOSTASIS (ARGON PLASMA COAGULATION/BICAP);  Surgeon: Rollin Dover, MD;  Location: [facility];  Service: Endoscopy;  Laterality: N/A;   HOT HEMOSTASIS N/A 12/05/2021   Procedure: HOT  HEMOSTASIS (ARGON PLASMA COAGULATION/BICAP);  Surgeon: Rollin Dover, MD;  Location: [facility];  Service: Gastroenterology;  Laterality: N/A;   IR RADIOLOGIST EVAL & MGMT  12/14/2016   IR RADIOLOGIST EVAL & MGMT  07/26/2021   IR RADIOLOGIST EVAL & MGMT  08/21/2023   LEFT HEART CATH AND CORS/GRAFTS ANGIOGRAPHY N/A 01/09/2018   Procedure: LEFT HEART CATH AND CORS/GRAFTS ANGIOGRAPHY;  Surgeon: Levern Hutching, MD;  Location: [facility];  Service: Cardiovascular;  Laterality: N/A;   LEFT HEART CATHETERIZATION WITH CORONARY ANGIOGRAM N/A 08/03/2014   Procedure: LEFT HEART CATHETERIZATION WITH CORONARY ANGIOGRAM;  Surgeon: Salena GORMAN Negri, MD;  Location: [facility] CATH LAB;  Service: Cardiovascular;  Laterality: N/A;   PATCH ANGIOPLASTY Right 09/26/2021   Procedure: PATCH ANGIOPLASTY RIGHT CAROTID;  Surgeon:  Eliza Lonni RAMAN, MD;  Location: [facility] OR;  Service: Vascular;  Laterality: Right;   Post Coronary Artery  BPG  01/05/2000   Right jugular sheath removed   PR VEIN BYPASS GRAFT,AORTO-FEM-POP     ROTATOR CUFF REPAIR     Right    Prior to Admission medications   Medication Sig Start Date End Date Taking? Authorizing Provider  acetaminophen  (TYLENOL ) 500 MG tablet Take 500-1,000 mg by mouth every 6 (six) hours as needed (for pain).    [provider]  albuterol  (PROVENTIL ) (2.5 MG/3ML) 0.083% nebulizer solution Take 3 mLs (2.5 mg total) by nebulization every 6 (six) hours as needed for wheezing or shortness of breath. 02/13/24 02/12/25  Cobb, Comer GAILS, NP  albuterol  (VENTOLIN  HFA) 108 (90 Base) MCG/ACT inhaler Inhale 2 puffs into the lungs every 6 (six) hours as needed for wheezing or shortness of breath. 05/25/20   Icard, Adine CROME, DO  amLODipine  (NORVASC ) 2.5 MG tablet Take 2.5 mg by mouth daily. 01/30/24   [provider]  atorvastatin  (LIPITOR) 40 MG tablet Take 1 tablet (40 mg total) by mouth at bedtime. Patient taking differently: Take 20 mg by mouth at bedtime. 09/29/21    Rhyne, Samantha J, PA-C  cetirizine  (ZYRTEC ) 10 MG tablet Take 10 mg by mouth daily as needed for allergies.     [provider]  clopidogrel  (PLAVIX ) 75 MG tablet Take 0.5 tablets (37.5 mg total) by mouth in the morning. 05/27/23   Patsy Lenis, MD  dexlansoprazole  (DEXILANT ) 60 MG capsule Take 60 mg by mouth daily before breakfast.    [provider]  furosemide  (LASIX ) 40 MG tablet Take 20 mg by mouth in the morning. 01/30/21   [provider]  magnesium  oxide (MAG-OX) 400 (240 Mg) MG tablet Take 400 mg by mouth daily after breakfast.    [provider]  methylPREDNISolone  (MEDROL ) 4 MG tablet Take 4 mg by mouth daily. 01/29/24   [provider]  nitroGLYCERIN  (NITRODUR - DOSED IN MG/24 HR) 0.2 mg/hr patch Place 0.2 mg onto the skin daily. Patient wears patch for 12 hours and removes 09/06/23   [provider]  nitroGLYCERIN  (NITROSTAT ) 0.4 MG SL tablet Place 1 tablet (0.4 mg total) under the tongue every 5 (five) minutes x 3 doses as needed for chest pain. 08/01/22   Cobb, Comer GAILS, NP  olmesartan  (BENICAR ) 20 MG tablet Take 1 tablet (20 mg total) by mouth daily. Patient taking differently: Take 10 mg by mouth in the morning. 07/04/20   Gonfa, Taye T, MD  predniSONE  (DELTASONE ) 10 MG tablet 4 tabs for 2 days, then 3 tabs for 2 days, 2 tabs for 2 days, then 1 tab for 2 days, then stop 02/13/24   Cobb, Comer GAILS, NP  Rimegepant Sulfate (NURTEC) 75 MG TBDP Take 1 tablet (75 mg total) by mouth as needed. 04/02/23   Rush Nest, MD  TRELEGY ELLIPTA  200-62.5-25 MCG/ACT AEPB USE 1 INHALATION BY MOUTH DAILY Patient taking differently: Inhale 1 puff into the lungs daily. 02/26/23   Brenna Adine CROME, DO    Current Facility-Administered Medications  Medication Dose Route Frequency Provider Last Rate Last Admin   0.9 %  sodium chloride  infusion (Manually program via Guardrails IV Fluids)   Intravenous Once Randol Simmonds, MD       Current Outpatient  Medications  Medication Sig Dispense Refill   acetaminophen  (TYLENOL ) 500 MG tablet Take 500-1,000 mg by mouth every 6 (six) hours as needed (for pain).  albuterol  (PROVENTIL ) (2.5 MG/3ML) 0.083% nebulizer solution Take 3 mLs (2.5 mg total) by nebulization every 6 (six) hours as needed for wheezing or shortness of breath. 75 mL 3   albuterol  (VENTOLIN  HFA) 108 (90 Base) MCG/ACT inhaler Inhale 2 puffs into the lungs every 6 (six) hours as needed for wheezing or shortness of breath. 8 g 6   amLODipine  (NORVASC ) 2.5 MG tablet Take 2.5 mg by mouth daily.     atorvastatin  (LIPITOR) 40 MG tablet Take 1 tablet (40 mg total) by mouth at bedtime. (Patient taking differently: Take 20 mg by mouth at bedtime.) 30 tablet 6   cetirizine  (ZYRTEC ) 10 MG tablet Take 10 mg by mouth daily as needed for allergies.      clopidogrel  (PLAVIX ) 75 MG tablet Take 0.5 tablets (37.5 mg total) by mouth in the morning.     dexlansoprazole  (DEXILANT ) 60 MG capsule Take 60 mg by mouth daily before breakfast.     furosemide  (LASIX ) 40 MG tablet Take 20 mg by mouth in the morning.     magnesium  oxide (MAG-OX) 400 (240 Mg) MG tablet Take 400 mg by mouth daily after breakfast.     methylPREDNISolone  (MEDROL ) 4 MG tablet Take 4 mg by mouth daily.     nitroGLYCERIN  (NITRODUR - DOSED IN MG/24 HR) 0.2 mg/hr patch Place 0.2 mg onto the skin daily. Patient wears patch for 12 hours and removes     nitroGLYCERIN  (NITROSTAT ) 0.4 MG SL tablet Place 1 tablet (0.4 mg total) under the tongue every 5 (five) minutes x 3 doses as needed for chest pain. 10 tablet 0   olmesartan  (BENICAR ) 20 MG tablet Take 1 tablet (20 mg total) by mouth daily. (Patient taking differently: Take 10 mg by mouth in the morning.) 90 tablet 1   predniSONE  (DELTASONE ) 10 MG tablet 4 tabs for 2 days, then 3 tabs for 2 days, 2 tabs for 2 days, then 1 tab for 2 days, then stop 20 tablet 0   Rimegepant Sulfate (NURTEC) 75 MG TBDP Take 1 tablet (75 mg total) by mouth as  needed. 8 tablet 6   TRELEGY ELLIPTA  200-62.5-25 MCG/ACT AEPB USE 1 INHALATION BY MOUTH DAILY (Patient taking differently: Inhale 1 puff into the lungs daily.) 180 each 3    Allergies as of 02/14/2024 - Review Complete 02/14/2024  Allergen Reaction Noted   Avelox [moxifloxacin hcl in nacl] Palpitations and Other (See Comments) 11/27/2011   Azithromycin  Swelling and Other (See Comments) 01/23/2017   Codeine Other (See Comments) 11/27/2011   Doxycycline  Swelling and Other (See Comments)    Hydromorphone  Palpitations and Other (See Comments) 01/05/2015   Levaquin  [levofloxacin ] Shortness Of Breath and Other (See Comments) 10/22/2015   Vitamin d analogs Swelling and Other (See Comments) 01/15/2020   Nifedipine er Other (See Comments) 11/18/2021   Octreotide   12/27/2021   Zetia [ezetimibe] Other (See Comments) 05/24/2023   Oxycodone-acetaminophen  Other (See Comments) 11/27/2011   Risedronate Other (See Comments) 08/02/2014    Family History  Problem Relation Age of Onset   Heart disease Mother        Heart Disease before age 32   Hypertension Mother    Hyperlipidemia Mother    Heart attack Mother    Clotting disorder Mother    Heart disease Father        Heart Disease before age 36   Heart attack Father    Hyperlipidemia Father    Hypertension Father    Heart disease Brother  Heart Disease before age 13   Hyperlipidemia Brother    Hypertension Brother    Clotting disorder Brother    AAA (abdominal aortic aneurysm) Brother    Cerebral aneurysm Sister    Hypertension Sister    AAA (abdominal aortic aneurysm) Sister    Asthma Sister    Cerebral aneurysm Brother    Cancer Brother        Lung   Hypertension Brother    Heart attack Brother    Heart disease Brother        Aneurysm of Brain   Hypertension Brother    Heart disease Brother    Heart disease Brother    Stroke Son        Aneurysm of Stomach   AAA (abdominal aortic aneurysm) Son    Cancer Maternal Uncle         great uncle/cancer/type unknown    Social History   Tobacco Use   Smoking status: Former    Current packs/day: 0.00    Average packs/day: 1.5 packs/day for 30.0 years (45.0 ttl pk-yrs)    Types: Cigarettes    Start date: 08/13/1970    Quit date: 08/13/2000    Years since quitting: 23.5    Passive exposure: Never   Smokeless tobacco: Never  Vaping Use   Vaping status: Never Used  Substance Use Topics   Alcohol use: No    Alcohol/week: 0.0 standard drinks of alcohol   Drug use: No    Review of Systems: All systems reviewed and negative except where noted in HPI.  Physical Exam: Vital signs in last 24 hours: Temp:  [98 F (36.7 C)] 98 F (36.7 C) (07/04 0848) Pulse Rate:  [68-80] 80 (07/04 0848) Resp:  [18] 18 (07/04 0848) BP: (130-162)/(50-80) 162/80 (07/04 0848) SpO2:  [98 %-100 %] 100 % (07/04 0848) Weight:  [63.5 kg-63.7 kg] 63.5 kg (07/04 0848)   Gen: awake, alert, NAD HEENT: anicteric  CV: RRR, no mrg Pulm: Distant breath sounds but clear anteriorly Abd: soft, significant tenderness in the right lower quadrant with mild voluntary guarding,  +BS throughout Ext: no c/c/e Neuro: nonfocal   Lab Results: Recent Labs    02/13/24 1601 02/14/24 0921  WBC 7.6 6.1  HGB 7.9* 7.5*  HCT 24.9* 24.3*  PLT 285 266   BMET Recent Labs    02/13/24 1601 02/14/24 0921  NA 138 142  K 4.8 4.2  CL 103 109  CO2 24 23  GLUCOSE 103* 98  BUN 42* 43*  CREATININE 2.60* 2.42*  CALCIUM  9.1 9.0   LFT Recent Labs    02/14/24 0921  PROT 6.9  ALBUMIN  3.7  AST 19  ALT 8  ALKPHOS 62  BILITOT 0.6    .    Latest Ref Rng & Units 02/14/2024    9:21 AM 02/13/2024    4:01 PM 12/25/2023    9:21 AM  CBC  WBC 4.0 - 10.5 K/uL 6.1  7.6  6.4   Hemoglobin 12.0 - 15.0 g/dL 7.5  7.9  9.5   Hematocrit 36.0 - 46.0 % 24.3  24.9  29.7   Platelets 150 - 400 K/uL 266  285  255     .    Latest Ref Rng & Units 02/14/2024    9:21 AM 02/13/2024    4:01 PM 11/19/2023    7:13 PM  CMP   Glucose 70 - 99 mg/dL 98  896  882   BUN 8 - 23 mg/dL 43  42  31   Creatinine 0.44 - 1.00 mg/dL 7.57  7.39  8.28   Sodium 135 - 145 mmol/L 142  138  141   Potassium 3.5 - 5.1 mmol/L 4.2  4.8  4.3   Chloride 98 - 111 mmol/L 109  103  108   CO2 22 - 32 mmol/L 23  24  24    Calcium  8.9 - 10.3 mg/dL 9.0  9.1  9.3   Total Protein 6.5 - 8.1 g/dL 6.9     Total Bilirubin 0.0 - 1.2 mg/dL 0.6     Alkaline Phos 38 - 126 U/L 62     AST 15 - 41 U/L 19     ALT 0 - 44 U/L 8      Studies/Results: DG Chest 2 View Result Date: 02/13/2024 CLINICAL DATA:  Dyspnea on exertion. EXAM: CHEST - 2 VIEW COMPARISON:  CT angiography chest from 11/20/2023. FINDINGS: There is a heterogeneous nonspecific approximately 2.1 x 3.1 cm opacity overlying the right lower lung zone, grossly similar to prior study. Please refer to CT scan chest report from 11/20/2023 for details. Bilateral lung fields are otherwise clear. No acute consolidation or lung collapse. Bilateral costophrenic angles are clear. Normal cardio-mediastinal silhouette. There are surgical staples along the heart border and sternotomy wires, status post CABG (coronary artery bypass graft). No acute osseous abnormalities. The soft tissues are within normal limits. There are surgical clips in the right upper quadrant, typical of a previous cholecystectomy. IMPRESSION: No acute cardiopulmonary abnormality. Electronically Signed   By: Ree Molt M.D.   On: 02/13/2024 17:02    Active Problems:   * No active hospital problems. [name] HERO. Benard Minturn, M.D. @  02/14/2024, 11:05 AM

## 2024-02-14 NOTE — H&P (Addendum)
 History and Physical  [name] FMW:[id number] DOB: September 13, 1940 DOA: 02/14/2024  PCP: Pura Lenis, MD   Chief Complaint: Lethargy, low hemoglobin  HPI: [name] is a 83 y.o. female with medical history significant for hypertension, CKD stage III, hyperlipidemia, small intestinal angiectasia, COPD chronically on 3 L nasal cannula oxygen  being admitted to the hospital with symptomatic anemia.  She has been feeling more dyspneic and weak of late, followed up as an outpatient with pulmonology recently workup was relatively unremarkable, however lab work was done and it returned today showing worsening anemia.  She states that for the last couple of weeks her stools may be a little bit darker than previously, but not black or pasty.  She has had some mild nausea without vomiting, and has experienced right lower quadrant abdominal tenderness for the last couple of weeks.  She was advised to come to the ER for evaluation of her acute on chronic anemia.  Review of Systems: Please see HPI for pertinent positives and negatives. A complete 10 system review of systems are otherwise negative.  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   Diverticulitis    Dyspnea    with exertion   Gastroesophageal reflux disease    occ   Glaucoma    right eye   Headache syndrome 11/27/2018   Hiatal hernia    History of shingles 06/23/2018   Hyperlipidemia    Hypertension    Lung cancer (HCC) dx 2018   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   Myocardial infarction (HCC) 01/01/2000    Cardiac catheterization   Peripheral vascular disease (HCC)    stents in legs x 2 or 3   Pneumonia    last time winter 2017 -2018   PONV (postoperative nausea and vomiting)    occassionally, last colonscopy did ok with anesthesia   Primary cancer of right lower lobe of lung (HCC) 04/25/2016   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   TIA (transient ischemic attack) 02/11/2019   Wears dentures    Full set   Wears glasses    Past Surgical History:  Procedure Laterality Date   ABDOMINAL HYSTERECTOMY     APPENDECTOMY     Arnold-chiari malformation repair  1998   Suboccipital craniectomy   CAROTID ENDARTERECTOMY  03/29/2010   Left  CEA   CHOLECYSTECTOMY     Gall Bladder   COLONOSCOPY WITH PROPOFOL  N/A 04/22/2015   Procedure: COLONOSCOPY WITH PROPOFOL ;  Surgeon: Belvie Just, MD;  Location: [facility];  Service: Endoscopy;  Laterality: N/A;   COLONOSCOPY WITH PROPOFOL  N/A 05/25/2016   Procedure: COLONOSCOPY WITH PROPOFOL ;  Surgeon: Belvie Just, MD;  Location: [facility];  Service: Endoscopy;  Laterality: N/A;   COLONOSCOPY WITH PROPOFOL  N/A 05/03/2017   Procedure: COLONOSCOPY WITH PROPOFOL ;  Surgeon: Just Belvie, MD;  Location: [facility];  Service: Endoscopy;  Laterality: N/A;   COLONOSCOPY WITH PROPOFOL  N/A 06/29/2020   Procedure: COLONOSCOPY WITH PROPOFOL ;  Surgeon: Just Belvie, MD;  Location: [facility];  Service: Endoscopy;  Laterality: N/A;  COLONOSCOPY WITH PROPOFOL  N/A 01/05/2021   Procedure: COLONOSCOPY WITH PROPOFOL ;  Surgeon: Rollin Dover, MD;  Location: [facility];  Service: Endoscopy;  Laterality: N/A;   COLONOSCOPY WITH PROPOFOL  N/A 09/28/2021   Procedure: COLONOSCOPY WITH PROPOFOL ;  Surgeon: Rollin Dover, MD;  Location: [facility] ENDOSCOPY;  Service: Endoscopy;  Laterality: N/A;   CORNEAL TRANSPLANT     Right   CORONARY ARTERY BYPASS GRAFT  01/01/2000   x 3   ENDARTERECTOMY Right 09/26/2021   Procedure: RIGHT CAROTID ENDARTERECTOMY;  Surgeon: Eliza Lonni RAMAN, MD;  Location: [facility] OR;  Service: Vascular;  Laterality: Right;   ENTEROSCOPY N/A 02/27/2018   Procedure: ENTEROSCOPY;  Surgeon: Rollin Dover, MD;  Location: [facility];  Service: Endoscopy;  Laterality: N/A;   ENTEROSCOPY N/A 07/18/2018   Procedure: ENTEROSCOPY;  Surgeon: Rollin Dover, MD;  Location: [facility];  Service: Endoscopy;  Laterality: N/A;   ENTEROSCOPY N/A 06/29/2020   Procedure: ENTEROSCOPY;  Surgeon: Rollin Dover, MD;  Location: [facility];  Service: Endoscopy;  Laterality: N/A;   ENTEROSCOPY N/A 12/05/2021   Procedure: ENTEROSCOPY;  Surgeon: Rollin Dover, MD;  Location: [facility];  Service: Gastroenterology;  Laterality: N/A;   ENTEROSCOPY N/A 12/25/2021   Procedure: ENTEROSCOPY;  Surgeon: Rollin Dover, MD;  Location: [facility];  Service: Gastroenterology;  Laterality: N/A;  with endoscopic placement of video capsule   ESOPHAGOGASTRODUODENOSCOPY N/A 05/25/2016   Procedure: ESOPHAGOGASTRODUODENOSCOPY (EGD);  Surgeon: Dover Rollin, MD;  Location: [facility];  Service: Endoscopy;  Laterality: N/A;   ESOPHAGOGASTRODUODENOSCOPY N/A 08/01/2018   Procedure: ESOPHAGOGASTRODUODENOSCOPY (EGD);  Surgeon: Rollin Dover, MD;  Location: [facility];  Service: Endoscopy;  Laterality: N/A;   ESOPHAGOGASTRODUODENOSCOPY (EGD) WITH PROPOFOL  N/A 09/28/2021   Procedure: ESOPHAGOGASTRODUODENOSCOPY (EGD) WITH PROPOFOL ;  Surgeon: Rollin Dover, MD;  Location: [facility] ENDOSCOPY;  Service: Endoscopy;  Laterality: N/A;   EYE SURGERY Right 1995 or 1996   Laser surgery for retinal hemorrhage   GIVENS CAPSULE STUDY N/A 07/16/2018   Procedure: GIVENS CAPSULE STUDY;  Surgeon: Rollin Dover, MD;  Location: [facility];  Service: Endoscopy;  Laterality: N/A;   GIVENS CAPSULE STUDY N/A 12/14/2021   Procedure: GIVENS CAPSULE STUDY;  Surgeon: Rollin Dover, MD;  Location: [facility] END[facility]COPY;  Service: Gastroenterology;  Laterality: N/A;   GIVENS CAPSULE STUDY N/A 12/25/2021   Procedure: GIVENS  CAPSULE STUDY;  Surgeon: Rollin Dover, MD;  Location: [facility];  Service: Gastroenterology;  Laterality: N/A;   HEMOSTASIS CLIP PLACEMENT  06/29/2020   Procedure: HEMOSTASIS CLIP PLACEMENT;  Surgeon: Rollin Dover, MD;  Location: [facility];  Service: Endoscopy;;   HEMOSTASIS CLIP PLACEMENT  01/05/2021   Procedure: HEMOSTASIS CLIP PLACEMENT;  Surgeon: Rollin Dover, MD;  Location: [facility];  Service: Endoscopy;;   HOT HEMOSTASIS N/A 02/27/2018   Procedure: HOT HEMOSTASIS (ARGON PLASMA COAGULATION/BICAP);  Surgeon: Rollin Dover, MD;  Location: [facility];  Service: Endoscopy;  Laterality: N/A;   HOT HEMOSTASIS N/A 08/01/2018   Procedure: HOT HEMOSTASIS (ARGON PLASMA COAGULATION/BICAP);  Surgeon: Rollin Dover, MD;  Location: [facility];  Service: Endoscopy;  Laterality: N/A;   HOT HEMOSTASIS N/A 06/29/2020   Procedure: HOT HEMOSTASIS (ARGON PLASMA COAGULATION/BICAP);  Surgeon: Rollin Dover, MD;  Location: [facility];  Service: Endoscopy;  Laterality: N/A;   HOT HEMOSTASIS N/A 01/05/2021   Procedure: HOT HEMOSTASIS (ARGON PLASMA COAGULATION/BICAP);  Surgeon: Rollin Dover, MD;  Location: [facility];  Service: Endoscopy;  Laterality: N/A;   HOT HEMOSTASIS N/A 12/05/2021   Procedure: HOT HEMOSTASIS (ARGON PLASMA COAGULATION/BICAP);  Surgeon: Rollin Dover, MD;  Location: [facility];  Service: Gastroenterology;  Laterality: N/A;   IR RADIOLOGIST EVAL & MGMT  12/14/2016   IR RADIOLOGIST EVAL & MGMT  07/26/2021   IR RADIOLOGIST EVAL & MGMT  08/21/2023   LEFT HEART CATH AND CORS/GRAFTS ANGIOGRAPHY N/A 01/09/2018   Procedure: LEFT HEART CATH AND CORS/GRAFTS ANGIOGRAPHY;  Surgeon: Levern Hutching, MD;  Location: [facility];  Service: Cardiovascular;  Laterality: N/A;   LEFT HEART CATHETERIZATION WITH CORONARY ANGIOGRAM N/A 08/03/2014   Procedure: LEFT HEART CATHETERIZATION WITH CORONARY ANGIOGRAM;  Surgeon: Salena GORMAN Negri, MD;  Location: [facility] CATH LAB;  Service: Cardiovascular;   Laterality: N/A;   PATCH ANGIOPLASTY Right 09/26/2021   Procedure: PATCH ANGIOPLASTY RIGHT CAROTID;  Surgeon: Eliza Lonni GORMAN, MD;  Location: [facility] OR;  Service: Vascular;  Laterality: Right;   Post Coronary Artery  BPG  01/05/2000   Right jugular sheath removed   PR VEIN BYPASS GRAFT,AORTO-FEM-POP     ROTATOR CUFF REPAIR     Right   Social History:  reports that she quit smoking about 23 years ago. Her smoking use included cigarettes. She started smoking about 53 years ago. She has a 45 pack-year smoking history. She has never been exposed to tobacco smoke. She has never used smokeless tobacco. She reports that she does not drink alcohol and does not use drugs.  Allergies  Allergen Reactions   Avelox [Moxifloxacin Hcl In Nacl] Palpitations and Other (See Comments)    Caused Heart Attack    Azithromycin  Swelling and Other (See Comments)    Patient reported past history of lip swelling   Codeine Other (See Comments)    Dr. Levern advised patient not to take this medication   Doxycycline  Swelling and Other (See Comments)    Mouth, lips, feet swelling   Hydromorphone  Palpitations and Other (See Comments)    DILAUDID   -  Pt had a Heart Attack after taking Dilaudid .   Levaquin  [Levofloxacin ] Shortness Of Breath and Other (See Comments)    Chest pressure, SOB, pain in between shoulder blades, sweaty -as reported by patient per experience in ED this afternoon   Vitamin D Analogs Swelling and Other (See Comments)    Face and lips swell, but no breathing issues   Nifedipine Er Other (See Comments)    Dropped the heart rate too low   Octreotide      Chest pain, N/V   Zetia [Ezetimibe] Other (See Comments)    Made me feel like my face was going to sleep/numb   Oxycodone-Acetaminophen  Other (See Comments)    Says it makes her feel weird   Risedronate Other (See Comments)    Chest pain    Family History  Problem Relation Age of Onset   Heart disease Mother        Heart Disease  before age 62   Hypertension Mother    Hyperlipidemia Mother    Heart attack Mother    Clotting disorder Mother    Heart disease Father        Heart Disease before age 66   Heart attack Father    Hyperlipidemia Father    Hypertension Father    Heart disease Brother        Heart Disease before age 84   Hyperlipidemia Brother    Hypertension Brother    Clotting disorder Brother    AAA (abdominal aortic aneurysm) Brother    Cerebral aneurysm Sister    Hypertension Sister    AAA (abdominal aortic aneurysm) Sister    Asthma Sister  Cerebral aneurysm Brother    Cancer Brother        Lung   Hypertension Brother    Heart attack Brother    Heart disease Brother        Aneurysm of Brain   Hypertension Brother    Heart disease Brother    Heart disease Brother    Stroke Son        Aneurysm of Stomach   AAA (abdominal aortic aneurysm) Son    Cancer Maternal Uncle        great uncle/cancer/type unknown     Prior to Admission medications   Medication Sig Start Date End Date Taking? Authorizing Provider  acetaminophen  (TYLENOL ) 500 MG tablet Take 500-1,000 mg by mouth every 6 (six) hours as needed (for pain).    [provider]  albuterol  (PROVENTIL ) (2.5 MG/3ML) 0.083% nebulizer solution Take 3 mLs (2.5 mg total) by nebulization every 6 (six) hours as needed for wheezing or shortness of breath. 02/13/24 02/12/25  Cobb, Comer GAILS, NP  albuterol  (VENTOLIN  HFA) 108 (90 Base) MCG/ACT inhaler Inhale 2 puffs into the lungs every 6 (six) hours as needed for wheezing or shortness of breath. 05/25/20   Icard, Adine CROME, DO  amLODipine  (NORVASC ) 2.5 MG tablet Take 2.5 mg by mouth daily. 01/30/24   [provider]  atorvastatin  (LIPITOR) 40 MG tablet Take 1 tablet (40 mg total) by mouth at bedtime. Patient taking differently: Take 20 mg by mouth at bedtime. 09/29/21   Rhyne, Samantha J, PA-C  cetirizine  (ZYRTEC ) 10 MG tablet Take 10 mg by mouth daily as needed for allergies.      [provider]  clopidogrel  (PLAVIX ) 75 MG tablet Take 0.5 tablets (37.5 mg total) by mouth in the morning. 05/27/23   Patsy Lenis, MD  dexlansoprazole  (DEXILANT ) 60 MG capsule Take 60 mg by mouth daily before breakfast.    [provider]  furosemide  (LASIX ) 40 MG tablet Take 20 mg by mouth in the morning. 01/30/21   [provider]  magnesium  oxide (MAG-OX) 400 (240 Mg) MG tablet Take 400 mg by mouth daily after breakfast.    [provider]  methylPREDNISolone  (MEDROL ) 4 MG tablet Take 4 mg by mouth daily. 01/29/24   [provider]  nitroGLYCERIN  (NITRODUR - DOSED IN MG/24 HR) 0.2 mg/hr patch Place 0.2 mg onto the skin daily. Patient wears patch for 12 hours and removes 09/06/23   [provider]  nitroGLYCERIN  (NITROSTAT ) 0.4 MG SL tablet Place 1 tablet (0.4 mg total) under the tongue every 5 (five) minutes x 3 doses as needed for chest pain. 08/01/22   Cobb, Comer GAILS, NP  olmesartan  (BENICAR ) 20 MG tablet Take 1 tablet (20 mg total) by mouth daily. Patient taking differently: Take 10 mg by mouth in the morning. 07/04/20   Gonfa, Taye T, MD  predniSONE  (DELTASONE ) 10 MG tablet 4 tabs for 2 days, then 3 tabs for 2 days, 2 tabs for 2 days, then 1 tab for 2 days, then stop 02/13/24   Cobb, Comer GAILS, NP  Rimegepant Sulfate (NURTEC) 75 MG TBDP Take 1 tablet (75 mg total) by mouth as needed. 04/02/23   Rush Nest, MD  TRELEGY ELLIPTA  200-62.5-25 MCG/ACT AEPB USE 1 INHALATION BY MOUTH DAILY Patient taking differently: Inhale 1 puff into the lungs daily. 02/26/23   Brenna Adine CROME, DO    Physical Exam: BP (!) 162/80 (BP Location: Right Arm)   Pulse 80   Temp 98 F (36.7 C) (  Oral)   Resp 18   Ht 5' 2 (1.575 m) Comment: Simultaneous filing. User may not have seen previous data.  Wt 63.5 kg Comment: Simultaneous filing. User may not have seen previous data.  SpO2 100%   BMI 25.61 kg/m  General:  Alert, oriented, calm, in no acute  distress, resting comfortably on nasal cannula oxygen  Cardiovascular: RRR, no murmurs or rubs, no peripheral edema  Respiratory: clear to auscultation bilaterally, no wheezes, no crackles  Abdomen: soft, significant tenderness in the right lower quadrant, without rebound or guarding, nondistended, normal bowel tones heard  Skin: dry, no rashes  Musculoskeletal: no joint effusions, normal range of motion  Psychiatric: appropriate affect, normal speech  Neurologic: extraocular muscles intact, clear speech, moving all extremities with intact sensorium         Labs on Admission:  Basic Metabolic Panel: Recent Labs  Lab 02/13/24 1601 02/14/24 0921  NA 138 142  K 4.8 4.2  CL 103 109  CO2 24 23  GLUCOSE 103* 98  BUN 42* 43*  CREATININE 2.60* 2.42*  CALCIUM  9.1 9.0   Liver Function Tests: Recent Labs  Lab 02/14/24 0921  AST 19  ALT 8  ALKPHOS 62  BILITOT 0.6  PROT 6.9  ALBUMIN  3.7   No results for input(s): LIPASE, AMYLASE in the last 168 hours. No results for input(s): AMMONIA in the last 168 hours. CBC: Recent Labs  Lab 02/13/24 1601 02/14/24 0921  WBC 7.6 6.1  NEUTROABS 3,914  --   HGB 7.9* 7.5*  HCT 24.9* 24.3*  MCV 89.6 90.7  PLT 285 266   Cardiac Enzymes: No results for input(s): CKTOTAL, CKMB, CKMBINDEX, TROPONINI in the last 168 hours. BNP (last 3 results) No results for input(s): BNP in the last 8760 hours.  ProBNP (last 3 results) No results for input(s): PROBNP in the last 8760 hours.  CBG: No results for input(s): GLUCAP in the last 168 hours.  Radiological Exams on Admission: DG Chest 2 View Result Date: 02/13/2024 CLINICAL DATA:  Dyspnea on exertion. EXAM: CHEST - 2 VIEW COMPARISON:  CT angiography chest from 11/20/2023. FINDINGS: There is a heterogeneous nonspecific approximately 2.1 x 3.1 cm opacity overlying the right lower lung zone, grossly similar to prior study. Please refer to CT scan chest report from 11/20/2023 for  details. Bilateral lung fields are otherwise clear. No acute consolidation or lung collapse. Bilateral costophrenic angles are clear. Normal cardio-mediastinal silhouette. There are surgical staples along the heart border and sternotomy wires, status post CABG (coronary artery bypass graft). No acute osseous abnormalities. The soft tissues are within normal limits. There are surgical clips in the right upper quadrant, typical of a previous cholecystectomy. IMPRESSION: No acute cardiopulmonary abnormality. Electronically Signed   By: Ree Molt M.D.   On: 02/13/2024 17:02   Assessment/Plan [name] is a 83 y.o. female with medical history significant for hypertension, CKD stage III, hyperlipidemia, small intestinal angiectasia, COPD chronically on 3 L nasal cannula oxygen  being admitted to the hospital with symptomatic anemia.  Symptomatic acute on chronic blood loss anemia-likely due to small bowel angiectasia's which have required ablation in the past.  She is followed as an outpatient by [name]. -Inpatient admission to progressive -Telemetry monitoring -Discontinue Plavix  for now -Trend hemoglobin -Transfused 2 unit PRBC -Appreciate GI consult -Per GI she is okay for regular diet  Right lower quadrant abdominal pain-significantly tender on exam, unclear etiology -Follow-up noncontrast CT abdomen  Chronic iron  deficiency anemia-will update iron  panel  Hypertension-was previously amlodipine  but this has been discontinued per the patient.  States that she now is on daily nitroglycerin  patch -Will resume home antihypertensive regimen once reconciled -IV hydralazine  as needed for SBP greater than 160  CKD stage III-renal function appears to be at baseline  CAD-takes 37.5 mg Plavix  daily, last dose 7/3 -Hold anticoagulants  Hyperlipidemia-Lipitor 20  GERD-p.o. prednisone   DVT prophylaxis: SCDs only    Code Status: Limited: Do not attempt resuscitation (DNR) -DNR-LIMITED -Do  Not Intubate/DNI   Consults called: [facility]  Admission status: The appropriate patient status for this patient is INPATIENT. Inpatient status is judged to be reasonable and necessary in order to provide the required intensity of service to ensure the patient's safety. The patient's presenting symptoms, physical exam findings, and initial radiographic and laboratory data in the context of their chronic comorbidities is felt to place them at high risk for further clinical deterioration. Furthermore, it is not anticipated that the patient will be medically stable for discharge from the hospital within 2 midnights of admission.    I certify that at the point of admission it is my clinical judgment that the patient will require inpatient hospital care spanning beyond 2 midnights from the point of admission due to high intensity of service, high risk for further deterioration and high frequency of surveillance required  Time spent: 59 minutes  Macenzie Burford CHRISTELLA Gail MD [facility] Pager [phone]  If 7PM-7AM, please contact night-coverage [email or web address] Password [email or web address]  02/14/2024, 11:21 AM

## 2024-02-14 NOTE — ED Provider Notes (Signed)
 [name] EMERGENCY DEPARTMENT AT [facility] Provider Note   CSN: [id number] Arrival date & time: 02/14/24  9161     Patient presents with: Abnormal Lab   [name] is a 83 y.o. female.    Abnormal Lab    Patient has a history of Asthmahaler hernia hypertension hyperlipidemia COPD coronary artery disease diverticulitis myocardial infarction acid reflux lung cancer.  Patient was seen at the doctor's office yesterday.  Patient was evaluated at the office for trouble with shortness of breath and lightheadedness.  Symptoms had been ongoing for about a week.  Patient had laboratory tests performed at her doctor's office.  Her hemoglobin was decreased down to 7.9.  1 month prior was 9.5.  Patient was instructed to come to the ED.  Patient states she has been having near syncopal episodes where she feels like she is going to pass out, especially when she tries to stand.  This has occurred a few times.  She has not noticed any blood in her stool.  Patient has had prior intestinal bleeding and states she has never noticed the blood in her stool.  She takes Plavix   Prior to Admission medications   Medication Sig Start Date End Date Taking? Authorizing Provider  acetaminophen  (TYLENOL ) 500 MG tablet Take 500-1,000 mg by mouth every 6 (six) hours as needed (for pain).    [provider]  albuterol  (PROVENTIL ) (2.5 MG/3ML) 0.083% nebulizer solution Take 3 mLs (2.5 mg total) by nebulization every 6 (six) hours as needed for wheezing or shortness of breath. 02/13/24 02/12/25  Malachy Comer GAILS, NP  albuterol  (VENTOLIN  HFA) 108 (90 Base) MCG/ACT inhaler Inhale 2 puffs into the lungs every 6 (six) hours as needed for wheezing or shortness of breath. 05/25/20   Icard, Adine CROME, DO  amLODipine  (NORVASC ) 2.5 MG tablet Take 2.5 mg by mouth daily. 01/30/24   [provider]  atorvastatin  (LIPITOR) 40 MG tablet Take 1 tablet (40 mg total) by mouth at bedtime. Patient taking  differently: Take 20 mg by mouth at bedtime. 09/29/21   Rhyne, Samantha J, PA-C  cetirizine  (ZYRTEC ) 10 MG tablet Take 10 mg by mouth daily as needed for allergies.     [provider]  clopidogrel  (PLAVIX ) 75 MG tablet Take 0.5 tablets (37.5 mg total) by mouth in the morning. 05/27/23   Patsy Lenis, MD  dexlansoprazole  (DEXILANT ) 60 MG capsule Take 60 mg by mouth daily before breakfast.    [provider]  furosemide  (LASIX ) 40 MG tablet Take 20 mg by mouth in the morning. 01/30/21   [provider]  magnesium  oxide (MAG-OX) 400 (240 Mg) MG tablet Take 400 mg by mouth daily after breakfast.    [provider]  methylPREDNISolone  (MEDROL ) 4 MG tablet Take 4 mg by mouth daily. 01/29/24   [provider]  nitroGLYCERIN  (NITRODUR - DOSED IN MG/24 HR) 0.2 mg/hr patch Place 0.2 mg onto the skin daily. Patient wears patch for 12 hours and removes 09/06/23   [provider]  nitroGLYCERIN  (NITROSTAT ) 0.4 MG SL tablet Place 1 tablet (0.4 mg total) under the tongue every 5 (five) minutes x 3 doses as needed for chest pain. 08/01/22   Cobb, Comer GAILS, NP  olmesartan  (BENICAR ) 20 MG tablet Take 1 tablet (20 mg total) by mouth daily. Patient taking differently: Take 10 mg by mouth in the morning. 07/04/20   Gonfa, Taye T, MD  predniSONE  (DELTASONE ) 10 MG tablet 4 tabs for 2 days, then  3 tabs for 2 days, 2 tabs for 2 days, then 1 tab for 2 days, then stop 02/13/24   Cobb, Comer GAILS, NP  Rimegepant Sulfate (NURTEC) 75 MG TBDP Take 1 tablet (75 mg total) by mouth as needed. 04/02/23   Rush Nest, MD  TRELEGY ELLIPTA  200-62.5-25 MCG/ACT AEPB USE 1 INHALATION BY MOUTH DAILY Patient taking differently: Inhale 1 puff into the lungs daily. 02/26/23   Brenna Adine CROME, DO    Allergies: Avelox [moxifloxacin hcl in nacl], Azithromycin , Codeine, Doxycycline , Hydromorphone , Levaquin  [levofloxacin ], Vitamin d analogs, Nifedipine er, Octreotide , Zetia [ezetimibe],  Oxycodone-acetaminophen , and Risedronate    Review of Systems  Updated Vital Signs BP (!) 162/80 (BP Location: Right Arm)   Pulse 80   Temp 98 F (36.7 C) (Oral)   Resp 18   Ht 1.575 m (5' 2) Comment: Simultaneous filing. User may not have seen previous data.  Wt 63.5 kg Comment: Simultaneous filing. User may not have seen previous data.  SpO2 100%   BMI 25.61 kg/m   Physical Exam Vitals and nursing note reviewed.  Constitutional:      Appearance: She is well-developed. She is not toxic-appearing.  HENT:     Head: Normocephalic and atraumatic.     Right Ear: External ear normal.     Left Ear: External ear normal.  Eyes:     General: No scleral icterus.       Right eye: No discharge.        Left eye: No discharge.     Conjunctiva/sclera: Conjunctivae normal.  Neck:     Trachea: No tracheal deviation.  Cardiovascular:     Rate and Rhythm: Normal rate and regular rhythm.  Pulmonary:     Effort: Pulmonary effort is normal. No respiratory distress.     Breath sounds: Normal breath sounds. No stridor. No wheezing or rales.  Abdominal:     General: Bowel sounds are normal. There is no distension.     Palpations: Abdomen is soft.     Tenderness: There is no abdominal tenderness. There is no guarding or rebound.  Genitourinary:    Comments: No gross blood noted on rectal exam Musculoskeletal:        General: No tenderness or deformity.     Cervical back: Neck supple.  Skin:    General: Skin is warm and dry.     Findings: No rash.  Neurological:     General: No focal deficit present.     Mental Status: She is alert.     Cranial Nerves: No cranial nerve deficit, dysarthria or facial asymmetry.     Sensory: No sensory deficit.     Motor: No abnormal muscle tone or seizure activity.     Coordination: Coordination normal.  Psychiatric:        Mood and Affect: Mood normal.     (all labs ordered are listed, but only abnormal results are displayed) Labs Reviewed   COMPREHENSIVE METABOLIC PANEL WITH GFR - Abnormal; Notable for the following components:      Result Value   BUN 43 (*)    Creatinine, Ser 2.42 (*)    GFR, Estimated 20 (*)    All other components within normal limits  CBC - Abnormal; Notable for the following components:   RBC 2.68 (*)    Hemoglobin 7.5 (*)    HCT 24.3 (*)    All other components within normal limits  POC OCCULT BLOOD, ED - Abnormal; Notable for the following components:  Fecal Occult Bld POSITIVE (*)    All other components within normal limits  TYPE AND SCREEN  PREPARE RBC (CROSSMATCH)    EKG: None  Radiology: DG Chest 2 View Result Date: 02/13/2024 CLINICAL DATA:  Dyspnea on exertion. EXAM: CHEST - 2 VIEW COMPARISON:  CT angiography chest from 11/20/2023. FINDINGS: There is a heterogeneous nonspecific approximately 2.1 x 3.1 cm opacity overlying the right lower lung zone, grossly similar to prior study. Please refer to CT scan chest report from 11/20/2023 for details. Bilateral lung fields are otherwise clear. No acute consolidation or lung collapse. Bilateral costophrenic angles are clear. Normal cardio-mediastinal silhouette. There are surgical staples along the heart border and sternotomy wires, status post CABG (coronary artery bypass graft). No acute osseous abnormalities. The soft tissues are within normal limits. There are surgical clips in the right upper quadrant, typical of a previous cholecystectomy. IMPRESSION: No acute cardiopulmonary abnormality. Electronically Signed   By: Ree Molt M.D.   On: 02/13/2024 17:02     .Critical Care  Performed by: Randol Simmonds, MD Authorized by: Randol Simmonds, MD   Critical care provider statement:    Critical care time (minutes):  30   Critical care was time spent personally by me on the following activities:  Development of treatment plan with patient or surrogate, discussions with consultants, evaluation of patient's response to treatment, examination of patient,  ordering and review of laboratory studies, ordering and review of radiographic studies, ordering and performing treatments and interventions, pulse oximetry, re-evaluation of patient's condition and review of old charts    Medications Ordered in the ED  0.9 %  sodium chloride  infusion (Manually program via Guardrails IV Fluids) (has no administration in time range)  sodium chloride  0.9 % bolus 500 mL (500 mLs Intravenous New Bag/Given 02/14/24 0939)    Clinical Course as of 02/14/24 1119  Fri Feb 14, 2024  0941 POC occult blood, ED(!) Hemoccult positive [JK]  1014 CBC(!) Hemoglobin decreased at 7.5 [JK]  1036 Comprehensive metabolic panel(!) Creatinine elevated decreased since yesterday although increased compared to couple months ago [JK]  1045 Case discussed with Amy Esterwood.  [facility].  Will see patient in consultation [JK]  1119 Case Discussed with Dr Roxane [JK]    Clinical Course User Index [JK] Randol Simmonds, MD                                 Medical Decision Making Problems Addressed: Gastrointestinal hemorrhage, unspecified gastrointestinal hemorrhage type: acute illness or injury that poses a threat to life or bodily functions  Amount and/or Complexity of Data Reviewed Labs: ordered. Decision-making details documented in ED Course.  Risk Prescription drug management. Decision regarding hospitalization.   Patient presented to the emergency room for evaluation of decreased hemoglobin.  Patient does have history of prior rectal bleeding.  Patient had not noticed any blood in her stool however in the ED she is Hemoccult positive.  Patient's hemoglobin is still low at 7.5, slightly lower than yesterday.  Patient remains hemodynamically stable but with her lightheadedness and dyspnea on exertion I suspect her anemia is contributing to the symptoms.  Will plan on blood transfusion.  Creatinine is elevated although decreased compared to yesterday.  Will need to monitor.  I  will consult with GI and the hospitalist service     Final diagnoses:  Gastrointestinal hemorrhage, unspecified gastrointestinal hemorrhage type    ED Discharge Orders  None          Randol Simmonds, MD 02/14/24 1119

## 2024-02-15 DIAGNOSIS — R1031 Right lower quadrant pain: Secondary | ICD-10-CM

## 2024-02-15 DIAGNOSIS — I10 Essential (primary) hypertension: Secondary | ICD-10-CM

## 2024-02-15 DIAGNOSIS — D62 Acute posthemorrhagic anemia: Secondary | ICD-10-CM | POA: Diagnosis not present

## 2024-02-15 DIAGNOSIS — K219 Gastro-esophageal reflux disease without esophagitis: Secondary | ICD-10-CM | POA: Diagnosis not present

## 2024-02-15 DIAGNOSIS — D649 Anemia, unspecified: Secondary | ICD-10-CM | POA: Diagnosis not present

## 2024-02-15 DIAGNOSIS — I2510 Atherosclerotic heart disease of native coronary artery without angina pectoris: Secondary | ICD-10-CM

## 2024-02-15 LAB — BASIC METABOLIC PANEL WITH GFR
Anion gap: 8 (ref 5–15)
BUN: 36 mg/dL — ABNORMAL HIGH (ref 8–23)
CO2: 24 mmol/L (ref 22–32)
Calcium: 8.9 mg/dL (ref 8.9–10.3)
Chloride: 109 mmol/L (ref 98–111)
Creatinine, Ser: 2.19 mg/dL — ABNORMAL HIGH (ref 0.44–1.00)
GFR, Estimated: 22 mL/min — ABNORMAL LOW (ref >60)
Glucose, Bld: 96 mg/dL (ref 70–99)
Potassium: 4.5 mmol/L (ref 3.5–5.1)
Sodium: 141 mmol/L (ref 135–145)

## 2024-02-15 LAB — CBC
HCT: 27.6 % — ABNORMAL LOW (ref 36.0–46.0)
Hemoglobin: 8.7 g/dL — ABNORMAL LOW (ref 12.0–15.0)
MCH: 28.8 pg (ref 26.0–34.0)
MCHC: 31.5 g/dL (ref 30.0–36.0)
MCV: 91.4 fL (ref 80.0–100.0)
Platelets: 205 K/uL (ref 150–400)
RBC: 3.02 MIL/uL — ABNORMAL LOW (ref 3.87–5.11)
RDW: 14.9 % (ref 11.5–15.5)
WBC: 6.5 K/uL (ref 4.0–10.5)
nRBC: 0 % (ref 0.0–0.2)

## 2024-02-15 MED ORDER — BUDESON-GLYCOPYRROL-FORMOTEROL 160-9-4.8 MCG/ACT IN AERO
2.0000 | INHALATION_SPRAY | Freq: Two times a day (BID) | RESPIRATORY_TRACT | Status: DC
  Administered 2024-02-15 – 2024-02-18 (×6): 2 via RESPIRATORY_TRACT
  Filled 2024-02-15: qty 5.9

## 2024-02-15 MED ORDER — AMLODIPINE BESYLATE 5 MG PO TABS: 2.5000 mg | ORAL_TABLET | Freq: Every day | ORAL | Status: DC

## 2024-02-15 MED ORDER — NITROGLYCERIN 0.2 MG/HR TD PT24
0.2000 mg | MEDICATED_PATCH | Freq: Every day | TRANSDERMAL | Status: DC
  Administered 2024-02-15 – 2024-02-18 (×4): 0.2 mg via TRANSDERMAL
  Filled 2024-02-15 (×4): qty 1

## 2024-02-15 MED ORDER — LORATADINE 10 MG PO TABS: 10.0000 mg | ORAL_TABLET | Freq: Every day | ORAL | Status: DC | PRN

## 2024-02-15 MED ORDER — NITROGLYCERIN 0.4 MG SL SUBL: 0.4000 mg | SUBLINGUAL_TABLET | SUBLINGUAL | Status: DC | PRN

## 2024-02-15 NOTE — Plan of Care (Incomplete)
  Problem: Education: Goal: Knowledge of General Education information will improve Description: Including pain rating scale, medication(s)/side effects and non-pharmacologic comfort measures Outcome: Progressing   Problem: Health Behavior/Discharge Planning: Goal: Ability to manage health-related needs will improve Outcome: Progressing   Problem: Clinical Measurements: Goal: Ability to maintain clinical measurements within normal limits will improve Outcome: Progressing Goal: Will remain free from infection Outcome: Progressing Goal: Diagnostic test results will improve Outcome: Progressing Goal: Respiratory complications will improve Outcome: Progressing Goal: Cardiovascular complication will be avoided Outcome: Progressing   Problem: Activity: Goal: Risk for activity intolerance will decrease Outcome: Progressing   Problem: Safety: Goal: Ability to remain free from injury will improve Outcome: Progressing   Problem: Skin Integrity: Goal: Risk for impaired skin integrity will decrease Outcome: Progressing   Problem: Nutrition: Goal: Adequate nutrition will be maintained Outcome: Adequate for Discharge   Problem: Coping: Goal: Level of anxiety will decrease Outcome: Adequate for Discharge   Problem: Elimination: Goal: Will not experience complications related to bowel motility Outcome: Adequate for Discharge Goal: Will not experience complications related to urinary retention Outcome: Adequate for Discharge   Problem: Pain Managment: Goal: General experience of comfort will improve and/or be controlled Outcome: Adequate for Discharge

## 2024-02-15 NOTE — Progress Notes (Signed)
 Progress Note for [name]   Assessment    83 y.o. 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  Small bowel angioectasias/heme positive stools and symptomatic anemia --symptomatically improved after 2 units of red cells; hemoglobin stable at 8.7 and 8.8 posttransfusion -- Plan for small bowel enteroscopy with Dr. Phebe on Monday -- Continue to hold Plavix  dose -- Every 24 hemoglobin -- Okay for regular diet, n.p.o. after midnight on Sunday into Monday  2.  Right lower quadrant pain --CT scan unrevealing; query nerve entrapment or muscle wall pain -- Could try lidocaine  patch  3.  CAD and PVD --holding Plavix  which she normally takes at 37.5 mg daily  4.  Acute on chronic renal injury --creatinine improving, monitor daily   Chief Complaint   Patient feeling better after blood transfusion Still intermittent right lower quadrant pain worse with movement She had a bowel movement this morning which was dark brown  Vital signs in last 24 hours: Temp:  [97 F (36.1 C)-98.6 F (37 C)] 97.9 F (36.6 C) (07/05 0420) Pulse Rate:  [48-73] 48 (07/05 0420) Resp:  [15-20] 18 (07/05 0420) BP: (129-189)/(32-65) 152/39 (07/05 0420) SpO2:  [99 %-100 %] 99 % (07/05 0846) Last BM Date : 02/15/24 Gen: awake, alert, NAD HEENT: anicteric  CV: RRR, no mrg Pulm: CTA b/l Abd: soft, right lower quadrant tenderness, ND, +BS throughout Ext: no c/c/e Neuro: nonfocal  Intake/Output from previous day: 07/04 0701 - 07/05 0700 In: 862 [P.O.:240; I.V.:250; Blood:372] Out: -  Intake/Output  this shift: Total I/O In: 238 [P.O.:238] Out: -   Lab Results: Recent Labs    02/13/24 1601 02/14/24 0921 02/14/24 2036 02/15/24 0509  WBC 7.6 6.1  --  6.5  HGB 7.9* 7.5* 8.8* 8.7*  HCT 24.9* 24.3* 26.5* 27.6*  PLT 285 266  --  205   BMET Recent Labs    02/13/24 1601 02/14/24 0921 02/15/24 0509  NA 138 142 141  K 4.8 4.2 4.5  CL 103 109 109  CO2 24 23 24   GLUCOSE 103* 98 96  BUN 42* 43* 36*  CREATININE 2.60* 2.42* 2.19*  CALCIUM  9.1 9.0 8.9   LFT Recent Labs    02/14/24 0921  PROT 6.9  ALBUMIN  3.7  AST 19  ALT 8  ALKPHOS 62  BILITOT 0.6   PT/INR No results for input(s): LABPROT, INR in the last 72 hours. Hepatitis Panel No results for input(s): HEPBSAG, HCVAB, HEPAIGM, HEPBIGM in the last 72 hours.  Studies/Results: CT ABDOMEN PELVIS WO CONTRAST Result Date: 02/14/2024 CLINICAL DATA:  Right lower quadrant abdominal pain. Chronic anemia. History of non-small cell lung cancer. EXAM: CT ABDOMEN AND PELVIS WITHOUT CONTRAST TECHNIQUE: Multidetector CT imaging of the abdomen and pelvis was performed following the standard protocol without IV contrast. RADIATION DOSE REDUCTION: This exam was performed according to the departmental dose-optimization program which includes automated exposure control, adjustment of the mA and/or kV according to patient size and/or use of iterative reconstruction technique. COMPARISON:  PET-CT 12/10/2023. CTA of the chest, abdomen and pelvis 11/20/2023. FINDINGS: Lower chest: Irregular right lower lobe pulmonary nodule measuring 3.2 x  2.6 cm on image 1/6 is incompletely visualized, although grossly unchanged from the previous CT. This was not significantly hypermetabolic on PET-CT. No enlarging nodules or confluent airspace opacities are seen at the lung bases. There is linear scarring in the right lower lobe. Aortic and coronary artery atherosclerosis post median sternotomy. Hepatobiliary: No suspicious hepatic findings on  noncontrast imaging. There is a stable cystic lesion in the left lobe on image 11/2. The gallbladder is surgically absent. No significant biliary dilatation. Pancreas: Unremarkable. No pancreatic ductal dilatation or surrounding inflammatory changes. Spleen: Normal in size without focal abnormality. Adrenals/Urinary Tract: Both adrenal glands appear normal. No evidence of urinary tract calculus, hydronephrosis or perinephric soft tissue stranding. Mild renal cortical thinning bilaterally. The bladder appears unremarkable for its degree of distention. Stomach/Bowel: No tear contrast administered. The stomach appears unremarkable for its degree of distension. No evidence of bowel wall thickening, distention or surrounding inflammatory change. Status post appendectomy, per EPIC. Mild distal colonic diverticulosis. Vascular/Lymphatic: There are no enlarged abdominal or pelvic lymph nodes. Diffuse aortic and branch vessel atherosclerosis with bilateral iliac stents. Reproductive: Status post hysterectomy. No evidence of adnexal mass. Other: No evidence of abdominal wall mass or hernia. No ascites or pneumoperitoneum. Musculoskeletal: No acute or significant osseous findings. No evidence of osseous metastatic disease in the abdomen or pelvis. Mild spondylosis. IMPRESSION: 1. No acute findings or explanation for the patient's symptoms. 2. No evidence of metastatic disease in the abdomen or pelvis. 3. Grossly stable known right lower lobe pulmonary nodule, incompletely visualized. See PET-CT report 12/10/2023. 4.  Aortic Atherosclerosis (ICD10-I70.0). Electronically Signed   By: Elsie Perone M.D.   On: 02/14/2024 12:18   DG Chest 2 View Result Date: 02/13/2024 CLINICAL DATA:  Dyspnea on exertion. EXAM: CHEST - 2 VIEW COMPARISON:  CT angiography chest from 11/20/2023. FINDINGS: There is a heterogeneous nonspecific approximately 2.1 x 3.1 cm opacity overlying the right lower lung zone, grossly similar to prior study.  Please refer to CT scan chest report from 11/20/2023 for details. Bilateral lung fields are otherwise clear. No acute consolidation or lung collapse. Bilateral costophrenic angles are clear. Normal cardio-mediastinal silhouette. There are surgical staples along the heart border and sternotomy wires, status post CABG (coronary artery bypass graft). No acute osseous abnormalities. The soft tissues are within normal limits. There are surgical clips in the right upper quadrant, typical of a previous cholecystectomy. IMPRESSION: No acute cardiopulmonary abnormality. Electronically Signed   By: Ree Molt M.D.   On: 02/13/2024 17:02      LOS: 1 day   [name] Natalie Starch, MD 02/15/2024, 10:46 AM See [facility], [facility], to contact our on call provider

## 2024-02-15 NOTE — Progress Notes (Signed)
 PROGRESS NOTE    [name]  FMW:[id number] DOB: 1940-09-30 DOA: 02/14/2024 PCP: Pura Lenis, MD    Chief Complaint  Patient presents with   Abnormal Lab    Brief Narrative:  Patient is a 83 year old female history of hypertension, CKD stage III, hyperlipidemia, small intestinal angiectasia, COPD chronically on 3 L nasal cannula presented to the hospital with symptomatic anemia.   Assessment & Plan:   Principal Problem:   Acute blood loss anemia (ABLA)   #1 symptomatic anemia/chronic blood loss anemia -Found likely secondary to small bowel angiectasia's that have required ablation.  From in the past. -Patient noted being followed by GI, [name] in the outpatient setting. -Patient chronically on Plavix  which has been held since admission. - Hemoglobin on admission noted at 7.5, status post transfusion 1 unit PRBCs with hemoglobin currently at 8.7. - CT abdomen and pelvis with no significant acute findings. - Patient denies any melanotic stools. - Patient seen in consultation by GI who are recommending small bowel enteroscopy with Dr Rollin on 02/17/2024. - Continue current regular diet and n.p.o. after midnight on 02/17/2024. - Follow H&H. - Transfusion threshold hemoglobin < 8. - GI following and appreciate input and recommendations.  2.  Right lower quadrant pain -CT abdomen pelvis with no acute findings to explain patient's right lower quadrant pain. - Per GI.  3.  CAD/PVD -Patient noted to be on Plavix  37.5 mg daily. - Continue to hold Plavix  secondary to problem 1.  4.  Hypertension -Patient noted to be off her Norvasc . - Place back on nitro patch. - Continue to hold ARB.  5.  GERD -PPI.  6.  Hyperlipidemia -Statin.  7. CKD stage IIIb - ARB on hold. -    DVT prophylaxis: SCDs Code Status: DNR Family Communication: Updated patient.  No family at bedside. Disposition: TBD  Status is: Inpatient Remains inpatient appropriate because: Severity of  illness   Consultants:  Gastroenterology: Dr. Albertus 02/14/2024  Procedures:  Transfusion 1 unit PRBC 02/14/2024 CT abdomen pelvis 02/14/2024   Antimicrobials:  Anti-infectives (From admission, onward)    None         Subjective: Patient lying in bed.  Overall states she feels much better than she did on admission.  Fatigue and weakness has improved.  Denies any chest pain or shortness of breath.  Still with complaints of intermittent right-sided lower quadrant abdominal pain.  States had brown stool this morning.  Objective: Vitals:   02/14/24 2148 02/15/24 0420 02/15/24 0846 02/15/24 1228  BP: (!) 149/51 (!) 152/39  (!) 159/58  Pulse:  (!) 48  (!) 53  Resp:  18  20  Temp:  97.9 F (36.6 C)  99 F (37.2 C)  TempSrc:  Oral  Oral  SpO2:  100% 99% 100%  Weight:      Height:        Intake/Output Summary (Last 24 hours) at 02/15/2024 1413 Last data filed at 02/15/2024 0855 Gross per 24 hour  Intake 860 ml  Output --  Net 860 ml   Filed Weights   02/14/24 0848  Weight: 63.5 kg    Examination:  General exam: Appears calm and comfortable  Respiratory system: Clear to auscultation.  No wheezes, no crackles, no crackles.  Fair air movement.  Respiratory effort normal. Cardiovascular system: S1 & S2 heard, RRR. No JVD, murmurs, rubs, gallops or clicks. No pedal edema. Gastrointestinal system: Abdomen is nondistended, soft and and some tenderness to palpation in the right lower quadrant.  Positive bowel sounds.  No rebound.  No guarding.  Central nervous system: Alert and oriented. No focal neurological deficits. Extremities: Symmetric 5 x 5 power. Skin: No rashes, lesions or ulcers Psychiatry: Judgement and insight appear normal. Mood & affect appropriate.     Data Reviewed: I have personally reviewed following labs and imaging studies  CBC: Recent Labs  Lab 02/13/24 1601 02/14/24 0921 02/14/24 2036 02/15/24 0509  WBC 7.6 6.1  --  6.5  NEUTROABS 3,914  --   --   --    HGB 7.9* 7.5* 8.8* 8.7*  HCT 24.9* 24.3* 26.5* 27.6*  MCV 89.6 90.7  --  91.4  PLT 285 266  --  205    Basic Metabolic Panel: Recent Labs  Lab 02/13/24 1601 02/14/24 0921 02/15/24 0509  NA 138 142 141  K 4.8 4.2 4.5  CL 103 109 109  CO2 24 23 24   GLUCOSE 103* 98 96  BUN 42* 43* 36*  CREATININE 2.60* 2.42* 2.19*  CALCIUM  9.1 9.0 8.9    GFR: Estimated Creatinine Clearance: 17.7 mL/min (A) (by C-G formula based on SCr of 2.19 mg/dL (H)).  Liver Function Tests: Recent Labs  Lab 02/14/24 0921  AST 19  ALT 8  ALKPHOS 62  BILITOT 0.6  PROT 6.9  ALBUMIN  3.7    CBG: No results for input(s): GLUCAP in the last 168 hours.   No results found for this or any previous visit (from the past 240 hours).       Radiology Studies: CT ABDOMEN PELVIS WO CONTRAST Result Date: 02/14/2024 CLINICAL DATA:  Right lower quadrant abdominal pain. Chronic anemia. History of non-small cell lung cancer. EXAM: CT ABDOMEN AND PELVIS WITHOUT CONTRAST TECHNIQUE: Multidetector CT imaging of the abdomen and pelvis was performed following the standard protocol without IV contrast. RADIATION DOSE REDUCTION: This exam was performed according to the departmental dose-optimization program which includes automated exposure control, adjustment of the mA and/or kV according to patient size and/or use of iterative reconstruction technique. COMPARISON:  PET-CT 12/10/2023. CTA of the chest, abdomen and pelvis 11/20/2023. FINDINGS: Lower chest: Irregular right lower lobe pulmonary nodule measuring 3.2 x 2.6 cm on image 1/6 is incompletely visualized, although grossly unchanged from the previous CT. This was not significantly hypermetabolic on PET-CT. No enlarging nodules or confluent airspace opacities are seen at the lung bases. There is linear scarring in the right lower lobe. Aortic and coronary artery atherosclerosis post median sternotomy. Hepatobiliary: No suspicious hepatic findings on noncontrast imaging.  There is a stable cystic lesion in the left lobe on image 11/2. The gallbladder is surgically absent. No significant biliary dilatation. Pancreas: Unremarkable. No pancreatic ductal dilatation or surrounding inflammatory changes. Spleen: Normal in size without focal abnormality. Adrenals/Urinary Tract: Both adrenal glands appear normal. No evidence of urinary tract calculus, hydronephrosis or perinephric soft tissue stranding. Mild renal cortical thinning bilaterally. The bladder appears unremarkable for its degree of distention. Stomach/Bowel: No tear contrast administered. The stomach appears unremarkable for its degree of distension. No evidence of bowel wall thickening, distention or surrounding inflammatory change. Status post appendectomy, per EPIC. Mild distal colonic diverticulosis. Vascular/Lymphatic: There are no enlarged abdominal or pelvic lymph nodes. Diffuse aortic and branch vessel atherosclerosis with bilateral iliac stents. Reproductive: Status post hysterectomy. No evidence of adnexal mass. Other: No evidence of abdominal wall mass or hernia. No ascites or pneumoperitoneum. Musculoskeletal: No acute or significant osseous findings. No evidence of osseous metastatic disease in the abdomen or pelvis. Mild spondylosis. IMPRESSION: 1.  No acute findings or explanation for the patient's symptoms. 2. No evidence of metastatic disease in the abdomen or pelvis. 3. Grossly stable known right lower lobe pulmonary nodule, incompletely visualized. See PET-CT report 12/10/2023. 4.  Aortic Atherosclerosis (ICD10-I70.0). Electronically Signed   By: Elsie Perone M.D.   On: 02/14/2024 12:18   DG Chest 2 View Result Date: 02/13/2024 CLINICAL DATA:  Dyspnea on exertion. EXAM: CHEST - 2 VIEW COMPARISON:  CT angiography chest from 11/20/2023. FINDINGS: There is a heterogeneous nonspecific approximately 2.1 x 3.1 cm opacity overlying the right lower lung zone, grossly similar to prior study. Please refer to CT scan  chest report from 11/20/2023 for details. Bilateral lung fields are otherwise clear. No acute consolidation or lung collapse. Bilateral costophrenic angles are clear. Normal cardio-mediastinal silhouette. There are surgical staples along the heart border and sternotomy wires, status post CABG (coronary artery bypass graft). No acute osseous abnormalities. The soft tissues are within normal limits. There are surgical clips in the right upper quadrant, typical of a previous cholecystectomy. IMPRESSION: No acute cardiopulmonary abnormality. Electronically Signed   By: Ree Molt M.D.   On: 02/13/2024 17:02        Scheduled Meds:  atorvastatin   20 mg Oral QHS   budesonide -glycopyrrolate -formoterol   2 puff Inhalation BID   nitroGLYCERIN   0.2 mg Transdermal Daily   pantoprazole   40 mg Oral Q0600   Continuous Infusions:   LOS: 1 day    Time spent: 40 mins    Toribio Hummer, MD [facility]   To contact the attending provider between 7A-7P or the covering provider during after hours 7P-7A, please log into the web site [email or web address] and access using universal [place] password for that web site. If you do not have the password, please call the hospital operator.  02/15/2024, 2:13 PM

## 2024-02-16 DIAGNOSIS — N179 Acute kidney failure, unspecified: Secondary | ICD-10-CM

## 2024-02-16 DIAGNOSIS — D649 Anemia, unspecified: Secondary | ICD-10-CM | POA: Diagnosis present

## 2024-02-16 DIAGNOSIS — D509 Iron deficiency anemia, unspecified: Secondary | ICD-10-CM | POA: Diagnosis not present

## 2024-02-16 DIAGNOSIS — E785 Hyperlipidemia, unspecified: Secondary | ICD-10-CM

## 2024-02-16 DIAGNOSIS — I739 Peripheral vascular disease, unspecified: Secondary | ICD-10-CM

## 2024-02-16 DIAGNOSIS — D62 Acute posthemorrhagic anemia: Secondary | ICD-10-CM | POA: Diagnosis not present

## 2024-02-16 DIAGNOSIS — R1031 Right lower quadrant pain: Secondary | ICD-10-CM | POA: Diagnosis present

## 2024-02-16 DIAGNOSIS — J9611 Chronic respiratory failure with hypoxia: Secondary | ICD-10-CM

## 2024-02-16 LAB — CBC WITH DIFFERENTIAL/PLATELET
Abs Immature Granulocytes: 0.02 K/uL (ref 0.00–0.07)
Basophils Absolute: 0 K/uL (ref 0.0–0.1)
Basophils Relative: 1 %
Eosinophils Absolute: 0.5 K/uL (ref 0.0–0.5)
Eosinophils Relative: 7 %
HCT: 27.8 % — ABNORMAL LOW (ref 36.0–46.0)
Hemoglobin: 8.8 g/dL — ABNORMAL LOW (ref 12.0–15.0)
Immature Granulocytes: 0 %
Lymphocytes Relative: 25 %
Lymphs Abs: 1.7 K/uL (ref 0.7–4.0)
MCH: 29.1 pg (ref 26.0–34.0)
MCHC: 31.7 g/dL (ref 30.0–36.0)
MCV: 92.1 fL (ref 80.0–100.0)
Monocytes Absolute: 0.7 K/uL (ref 0.1–1.0)
Monocytes Relative: 11 %
Neutro Abs: 3.9 K/uL (ref 1.7–7.7)
Neutrophils Relative %: 56 %
Platelets: 202 K/uL (ref 150–400)
RBC: 3.02 MIL/uL — ABNORMAL LOW (ref 3.87–5.11)
RDW: 15.3 % (ref 11.5–15.5)
WBC: 6.8 K/uL (ref 4.0–10.5)
nRBC: 0 % (ref 0.0–0.2)

## 2024-02-16 LAB — BASIC METABOLIC PANEL WITH GFR
Anion gap: 6 (ref 5–15)
BUN: 36 mg/dL — ABNORMAL HIGH (ref 8–23)
CO2: 25 mmol/L (ref 22–32)
Calcium: 9.2 mg/dL (ref 8.9–10.3)
Chloride: 107 mmol/L (ref 98–111)
Creatinine, Ser: 1.79 mg/dL — ABNORMAL HIGH (ref 0.44–1.00)
GFR, Estimated: 28 mL/min — ABNORMAL LOW (ref >60)
Glucose, Bld: 96 mg/dL (ref 70–99)
Potassium: 5.1 mmol/L (ref 3.5–5.1)
Sodium: 138 mmol/L (ref 135–145)

## 2024-02-16 NOTE — TOC Initial Note (Signed)
 Transition of Care [facility]) - Initial/Assessment Note    Patient Details  Name: [name] MRN: [id number] Date of Birth: [birth year]  [facility]) CM/SW Contact:    Sonda Manuella Quill, RN Phone Number: 02/16/2024, 12:59 PM  Clinical Narrative:                 [place] w/ pt in room; pt says she shares a home w/ her son; she plans to return at d/c; she identified POC [place] [phone]); her family will provide transportation; pt verified insurance/PCP; she denied; pt says she has walker, BSC, and shower chair; pt says she does not have HH services; she has home oxygen  w/ [employer]; her family will bring full travel tank at d/c; TOC is following.  Expected Discharge Plan: Home/Self Care Barriers to Discharge: Continued Medical Work up   Patient Goals and CMS Choice Patient states their goals for this hospitalization and ongoing recovery are:: home CMS Medicare.gov Compare Post Acute Care list provided to:: Patient   [place] ownership interest in [facility].provided to:: Patient    Expected Discharge Plan and Services   Discharge Planning Services: CM Consult   Living arrangements for the past 2 months: Single Family Home                                      Prior Living Arrangements/Services Living arrangements for the past 2 months: Single Family Home Lives with:: Adult Children Patient language and need for interpreter reviewed:: Yes Do you feel safe going back to the place where you live?: Yes      Need for Family Participation in Patient Care: Yes (Comment) Care giver support system in place?: Yes (comment) Current home services: DME (walker, BSC, and shower chair) Criminal Activity/Legal Involvement Pertinent to Current Situation/Hospitalization: No - Comment as needed  Activities of Daily Living   ADL Screening (condition at time of admission) Independently performs ADLs?: Yes (appropriate for developmental age) Is the patient  deaf or have difficulty hearing?: No Does the patient have difficulty seeing, even when wearing glasses/contacts?: No Does the patient have difficulty concentrating, remembering, or making decisions?: No  Permission Sought/Granted Permission sought to share information with : Case Manager Permission granted to share information with : Yes, Verbal Permission Granted  Share Information with NAME: Case Manager     Permission granted to share info w Relationship: Alcide Lucks (dtr) [phone]     Emotional Assessment Appearance:: Appears stated age Attitude/Demeanor/Rapport: Gracious Affect (typically observed): Accepting Orientation: : Oriented to Self, Oriented to Place, Oriented to  Time, Oriented to Situation Alcohol / Substance Use: Not Applicable Psych Involvement: No (comment)  Admission diagnosis:  Gastrointestinal hemorrhage, unspecified gastrointestinal hemorrhage type [K92.2] Acute blood loss anemia (ABLA) [D62] Patient Active Problem List   Diagnosis Date Noted   Acute blood loss anemia (ABLA) 02/14/2024   Chronic respiratory failure with hypoxia (HCC) 01/22/2024   CKD (chronic kidney disease) stage 4, GFR 15-29 ml/min (HCC) 05/25/2023   History of intestinal angiodysplasia 05/24/2023   ACS (acute coronary syndrome) (HCC) 08/01/2022   History of transient ischemic attack (TIA) 12/03/2021   S/P carotid endarterectomy 12/03/2021   Rectal bleeding 12/02/2021   Chronic diastolic CHF (congestive heart failure) (HCC) 11/18/2021   Numbness 11/18/2021   Malnutrition of moderate degree 09/29/2021   Asymptomatic stenosis of right carotid artery without infarction 09/26/2021   Iron  deficiency anemia due  to chronic blood loss 08/31/2021   Acute diastolic CHF (congestive heart failure) (HCC) 07/03/2020   Anemia 07/02/2020   TIA (transient ischemic attack) 02/11/2019   Headache syndrome 11/27/2018   GI bleed 07/15/2018   Occult blood positive stool 07/14/2018   History of  shingles 06/23/2018   Neuralgia and neuritis 06/23/2018   Costochondritis, acute 04/18/2018   Hyperkalemia 02/06/2017   Physical deconditioning 11/08/2016   Acute lower GI bleeding    BRBPR (bright red blood per rectum) 09/15/2016   Iron  deficiency anemia 06/07/2016   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   Primary cancer of right lower lobe of lung (HCC) 04/25/2016   S/P CABG x 3 10/28/2015   Foot pain, right 10/24/2015   UTI (urinary tract infection) 10/22/2015   Neck pain on right side 01/05/2015   Chest wall pain 08/02/2014   Intractable headache 08/02/2014   HLD (hyperlipidemia) 08/02/2014   Essential hypertension 08/02/2014   GERD (gastroesophageal reflux disease) 08/02/2014   Leg pain, right 08/02/2014   HA (headache)    Carotid artery stenosis 12/09/2013   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   Dehydration 04/02/2013   CAD (coronary artery disease) 04/02/2013   AKI (acute kidney injury) (HCC) 04/01/2013   Nausea & vomiting 04/01/2013   Diarrhea 04/01/2013   Hypokalemia 04/01/2013   Hyponatremia 04/01/2013   Peripheral vascular disease (HCC) 01/07/2013   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   Dizziness and giddiness 01/07/2013   DOE (dyspnea on exertion) 01/07/2013   Cough 11/17/2012   COPD 11/17/2012   PCP:  Pura Lenis, MD Pharmacy:   [facility] [phone] [address], [place] - [address] AT [facility] [address] [place] [address] Phone: [phone] Fax: [phone]  [facility] [place] [facility] [address] [place] [address] Phone: [phone] Fax: [phone]     Social Drivers of Health (SDOH) Social History: SDOH Screenings   Food Insecurity: No Food Insecurity (02/16/2024)  Housing: Low Risk  (02/16/2024)  Transportation Needs: No Transportation Needs (02/16/2024)  Utilities: Not At Risk (02/16/2024)  Depression (PHQ2-9): Low Risk  (06/12/2023)  Financial Resource Strain:  Low Risk  (11/20/2023)   Received from [facility]  Physical Activity: Unknown (11/20/2023)   Received from [facility]  Social Connections: Moderately Integrated (02/14/2024)  Recent Concern: Social Connections - Socially Isolated (11/20/2023)   Received from [facility]  Stress: Patient Declined (11/20/2023)   Received from [facility]  Tobacco Use: Medium Risk (02/14/2024)   SDOH Interventions: Food Insecurity Interventions: Intervention Not Indicated, Inpatient TOC Housing Interventions: Intervention Not Indicated, Inpatient TOC Transportation Interventions: Intervention Not Indicated, Inpatient TOC Utilities Interventions: Intervention Not Indicated, Inpatient TOC   Readmission Risk Interventions    02/16/2024   12:57 PM 12/26/2021   12:57 PM  Readmission Risk Prevention Plan  Transportation Screening Complete Complete  PCP or Specialist Appt within 3-5 Days Complete Complete  HRI or Home Care Consult Complete Complete  Social Work Consult for Recovery Care Planning/Counseling Complete Complete  Palliative Care Screening Not Applicable Not Applicable  Medication Review Oceanographer) Complete Complete

## 2024-02-16 NOTE — Progress Notes (Signed)
 PROGRESS NOTE    [name]  FMW:[id number] DOB: [birth year] DOA: 02/14/2024 PCP: Pura Lenis, MD    Chief Complaint  Patient presents with   Abnormal Lab    Brief Narrative:  Patient is a 83 year old female history of hypertension, CKD stage III, hyperlipidemia, small intestinal angiectasia, COPD chronically on 3 L nasal cannula presented to the hospital with symptomatic anemia.   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  From in the past. -Patient noted being followed by GI, [name] in the outpatient setting. -Patient chronically on Plavix  which has been held since admission. - Hemoglobin on admission noted at 7.5, status post transfusion 1 unit PRBCs with hemoglobin currently at 8.8. - CT abdomen and pelvis with no significant acute findings. - Patient denies any melanotic stools. - Patient seen in consultation by GI who are recommending small bowel enteroscopy with Dr Rollin on 02/17/2024. - Continue current regular diet and n.p.o. after midnight on 02/17/2024. - Follow H&H. - Transfusion threshold hemoglobin < 8. - GI following and appreciate input and recommendations.  2.  Right lower quadrant pain -CT abdomen pelvis with no acute findings to explain patient's right lower quadrant pain. - Per GI.  3.  CAD/PVD -Patient noted to be on Plavix  37.5 mg daily. - Continue to hold Plavix  secondary to problem 1.  4.  Hypertension -Patient noted to be off her Norvasc  prior to admission. - Continue Nitropatch.   - Continue to hold  ARB.    5.  GERD - Continue PPI.  6.  Hyperlipidemia - Continue statin.  7.  AKI on CKD stage IIIb - Renal function trending down. -Continue to hold ARB. -Follow.   DVT prophylaxis: SCDs Code Status: DNR Family Communication: Updated patient.  No family at bedside. Disposition: TBD  Status is: Inpatient Remains inpatient appropriate because: Severity of illness   Consultants:  Gastroenterology: Dr. Albertus 02/14/2024  Procedures:  Transfusion 1 unit PRBC 02/14/2024 CT abdomen pelvis 02/14/2024   Antimicrobials:  Anti-infectives (From admission, onward)    None         Subjective: Sitting up in recliner on the telephone.  Denies any chest pain or shortness of breath.  No abdominal pain.  Stated last bowel movement was yesterday when she had brown stool.  Still with some complaints of some intermittent right lower quadrant abdominal pain.  Tolerating current diet.   Objective: Vitals:   02/15/24 2001 02/16/24 0517 02/16/24 0633 02/16/24 1200  BP: (!) 159/49 (!) 151/35    Pulse: (!) 50 (!) 50    Resp: 18  18   Temp: 98.5 F (36.9 C) (!) 96.8 F (36 C)    TempSrc: Oral Axillary    SpO2: 100% 100% 99% 99%  Weight:      Height:        Intake/Output Summary (Last 24 hours) at 02/16/2024 1527 Last data filed at 02/16/2024 0800 Gross per 24 hour  Intake 378 ml  Output --  Net 378 ml   Filed Weights   02/14/24 0848  Weight: 63.5 kg  Examination:  General exam: NAD. Respiratory system: Lungs clear to auscultation bilaterally.  No wheezes, no crackles, no rhonchi.  Fair air movement.  Speaking in full sentences.  Cardiovascular system: Regular rate rhythm no murmurs rubs or gallops.  No JVD.  No pitting lower extremity edema.  Gastrointestinal system: Abdomen is soft, nondistended, some tenderness to palpation in the right lower quadrant.  Positive bowel sounds.  No rebound.  No guarding.  Central nervous system: Alert and oriented. No focal neurological  deficits. Extremities: Symmetric 5 x 5 power. Skin: No rashes, lesions or ulcers Psychiatry: Judgement and insight appear normal. Mood & affect appropriate.     Data Reviewed: I have personally reviewed following labs and imaging studies  CBC: Recent Labs  Lab 02/13/24 1601 02/14/24 0921 02/14/24 2036 02/15/24 0509 02/16/24 1100  WBC 7.6 6.1  --  6.5 6.8  NEUTROABS 3,914  --   --   --  3.9  HGB 7.9* 7.5* 8.8* 8.7* 8.8*  HCT 24.9* 24.3* 26.5* 27.6* 27.8*  MCV 89.6 90.7  --  91.4 92.1  PLT 285 266  --  205 202    Basic Metabolic Panel: Recent Labs  Lab 02/13/24 1601 02/14/24 0921 02/15/24 0509 02/16/24 1100  NA 138 142 141 138  K 4.8 4.2 4.5 5.1  CL 103 109 109 107  CO2 24 23 24 25   GLUCOSE 103* 98 96 96  BUN 42* 43* 36* 36*  CREATININE 2.60* 2.42* 2.19* 1.79*  CALCIUM  9.1 9.0 8.9 9.2    GFR: Estimated Creatinine Clearance: 21.6 mL/min (A) (by C-G formula based on SCr of 1.79 mg/dL (H)).  Liver Function Tests: Recent Labs  Lab 02/14/24 0921  AST 19  ALT 8  ALKPHOS 62  BILITOT 0.6  PROT 6.9  ALBUMIN  3.7    CBG: No results for input(s): GLUCAP in the last 168 hours.   No results found for this or any previous visit (from the past 240 hours).       Radiology Studies: No results found.       Scheduled Meds:  atorvastatin   20 mg Oral QHS   budesonide -glycopyrrolate -formoterol   2 puff Inhalation BID   nitroGLYCERIN   0.2 mg Transdermal Daily   pantoprazole   40 mg Oral Q0600   Continuous Infusions:   LOS: 2 days    Time spent: 40 mins    Toribio Hummer, MD [facility]   To contact the attending provider between 7A-7P or the covering provider during after hours 7P-7A, please log into the web site [email or web address] and access using universal [place] password for that web site. If you do not have the password, please call the hospital operator.  02/16/2024, 3:27 PM

## 2024-02-16 NOTE — H&P (View-Only) (Signed)
 Progress Note   Assessment    83 y.o. 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  Small bowel angioectasias/heme positive stools and symptomatic anemia --symptomatically improved after 2 units of red cells; hemoglobin stable again today -- Plan for small bowel enteroscopy with [name] tomorrow -- Continue to hold Plavix  dose -- Every 24 hemoglobin -- Okay for regular diet, n.p.o. after midnight    2.  Right lower quadrant pain --CT scan unrevealing; query nerve entrapment or muscle wall pain -- Could try lidocaine  patch   3.  CAD and PVD --holding Plavix  which she normally takes at 37.5 mg daily   4.  Acute on chronic renal injury --creatinine improving, monitor daily     Chief Complaint   Feels well today Dark stool but no visible blood or tarriness Handling diet without problem  Vital signs in last 24 hours: Temp:  [96.8 F (36 C)-99 F (37.2 C)] 96.8 F (36 C) (07/06 0517) Pulse Rate:  [50-53] 50 (07/06 0517) Resp:  [18-20] 18 (07/06 9366) BP: (151-159)/(35-58) 151/35 (07/06 0517) SpO2:  [99 %-100 %] 99 % (07/06 9366) Last BM Date : 02/15/24 Gen: awake, alert, NAD HEENT: anicteric  Abd: soft, NT/ND, +BS throughout Ext: no c/c/e Neuro: nonfocal  Intake/Output from previous day: 07/05 0701 - 07/06 0700 In: 378 [P.O.:378] Out: -  Intake/Output this shift: Total I/O In: 238 [P.O.:238] Out: -   Lab Results: Recent Labs    02/14/24 0921 02/14/24 2036 02/15/24 0509 02/16/24 1100  WBC 6.1  --  6.5 6.8  HGB 7.5* 8.8* 8.7* 8.8*  HCT 24.3* 26.5*  27.6* 27.8*  PLT 266  --  205 202   BMET Recent Labs    02/13/24 1601 02/14/24 0921 02/15/24 0509  NA 138 142 141  K 4.8 4.2 4.5  CL 103 109 109  CO2 24 23 24   GLUCOSE 103* 98 96  BUN 42* 43* 36*  CREATININE 2.60* 2.42* 2.19*  CALCIUM  9.1 9.0 8.9   LFT Recent Labs    02/14/24 0921  PROT 6.9  ALBUMIN  3.7  AST 19  ALT 8  ALKPHOS 62  BILITOT 0.6   PT/INR No results for input(s): LABPROT, INR in the last 72 hours. Hepatitis Panel No results for input(s): HEPBSAG, HCVAB, HEPAIGM, HEPBIGM in the last 72 hours.  Studies/Results: CT ABDOMEN PELVIS WO CONTRAST Result Date: 02/14/2024 CLINICAL DATA:  Right lower quadrant abdominal pain. Chronic anemia. History of non-small cell lung cancer. EXAM: CT ABDOMEN AND PELVIS WITHOUT CONTRAST TECHNIQUE: Multidetector CT imaging of the abdomen and pelvis was performed following the standard protocol without IV contrast. RADIATION DOSE REDUCTION: This exam was performed according to the departmental dose-optimization program which includes automated exposure control, adjustment of the mA and/or kV according to patient size and/or use of iterative reconstruction technique. COMPARISON:  PET-CT 12/10/2023. CTA of the chest, abdomen and pelvis 11/20/2023. FINDINGS: Lower chest: Irregular right lower lobe pulmonary nodule measuring 3.2 x 2.6 cm on image 1/6 is incompletely visualized, although grossly unchanged from the previous CT. This was not significantly hypermetabolic on PET-CT. No enlarging nodules or confluent airspace opacities  are seen at the lung bases. There is linear scarring in the right lower lobe. Aortic and coronary artery atherosclerosis post median sternotomy. Hepatobiliary: No suspicious hepatic findings on noncontrast imaging. There is a stable cystic lesion in the left lobe on image 11/2. The gallbladder is surgically absent. No significant biliary dilatation. Pancreas: Unremarkable. No pancreatic ductal dilatation or  surrounding inflammatory changes. Spleen: Normal in size without focal abnormality. Adrenals/Urinary Tract: Both adrenal glands appear normal. No evidence of urinary tract calculus, hydronephrosis or perinephric soft tissue stranding. Mild renal cortical thinning bilaterally. The bladder appears unremarkable for its degree of distention. Stomach/Bowel: No tear contrast administered. The stomach appears unremarkable for its degree of distension. No evidence of bowel wall thickening, distention or surrounding inflammatory change. Status post appendectomy, per EPIC. Mild distal colonic diverticulosis. Vascular/Lymphatic: There are no enlarged abdominal or pelvic lymph nodes. Diffuse aortic and branch vessel atherosclerosis with bilateral iliac stents. Reproductive: Status post hysterectomy. No evidence of adnexal mass. Other: No evidence of abdominal wall mass or hernia. No ascites or pneumoperitoneum. Musculoskeletal: No acute or significant osseous findings. No evidence of osseous metastatic disease in the abdomen or pelvis. Mild spondylosis. IMPRESSION: 1. No acute findings or explanation for the patient's symptoms. 2. No evidence of metastatic disease in the abdomen or pelvis. 3. Grossly stable known right lower lobe pulmonary nodule, incompletely visualized. See PET-CT report 12/10/2023. 4.  Aortic Atherosclerosis (ICD10-I70.0). Electronically Signed   By: Elsie Perone M.D.   On: 02/14/2024 12:18      LOS: 2 days   Gordy CHRISTELLA Starch, MD 02/16/2024, 11:38 AM See TRACEY, [facility], to contact our on call provider

## 2024-02-16 NOTE — Progress Notes (Signed)
 Progress Note   Assessment    83 y.o. 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  Small bowel angioectasias/heme positive stools and symptomatic anemia --symptomatically improved after 2 units of red cells; hemoglobin stable again today -- Plan for small bowel enteroscopy with [name] tomorrow -- Continue to hold Plavix  dose -- Every 24 hemoglobin -- Okay for regular diet, n.p.o. after midnight    2.  Right lower quadrant pain --CT scan unrevealing; query nerve entrapment or muscle wall pain -- Could try lidocaine  patch   3.  CAD and PVD --holding Plavix  which she normally takes at 37.5 mg daily   4.  Acute on chronic renal injury --creatinine improving, monitor daily     Chief Complaint   Feels well today Dark stool but no visible blood or tarriness Handling diet without problem  Vital signs in last 24 hours: Temp:  [96.8 F (36 C)-99 F (37.2 C)] 96.8 F (36 C) (07/06 0517) Pulse Rate:  [50-53] 50 (07/06 0517) Resp:  [18-20] 18 (07/06 9366) BP: (151-159)/(35-58) 151/35 (07/06 0517) SpO2:  [99 %-100 %] 99 % (07/06 9366) Last BM Date : 02/15/24 Gen: awake, alert, NAD HEENT: anicteric  Abd: soft, NT/ND, +BS throughout Ext: no c/c/e Neuro: nonfocal  Intake/Output from previous day: 07/05 0701 - 07/06 0700 In: 378 [P.O.:378] Out: -  Intake/Output this shift: Total I/O In: 238 [P.O.:238] Out: -   Lab Results: Recent Labs    02/14/24 0921 02/14/24 2036 02/15/24 0509 02/16/24 1100  WBC 6.1  --  6.5 6.8  HGB 7.5* 8.8* 8.7* 8.8*  HCT 24.3* 26.5*  27.6* 27.8*  PLT 266  --  205 202   BMET Recent Labs    02/13/24 1601 02/14/24 0921 02/15/24 0509  NA 138 142 141  K 4.8 4.2 4.5  CL 103 109 109  CO2 24 23 24   GLUCOSE 103* 98 96  BUN 42* 43* 36*  CREATININE 2.60* 2.42* 2.19*  CALCIUM  9.1 9.0 8.9   LFT Recent Labs    02/14/24 0921  PROT 6.9  ALBUMIN  3.7  AST 19  ALT 8  ALKPHOS 62  BILITOT 0.6   PT/INR No results for input(s): LABPROT, INR in the last 72 hours. Hepatitis Panel No results for input(s): HEPBSAG, HCVAB, HEPAIGM, HEPBIGM in the last 72 hours.  Studies/Results: CT ABDOMEN PELVIS WO CONTRAST Result Date: 02/14/2024 CLINICAL DATA:  Right lower quadrant abdominal pain. Chronic anemia. History of non-small cell lung cancer. EXAM: CT ABDOMEN AND PELVIS WITHOUT CONTRAST TECHNIQUE: Multidetector CT imaging of the abdomen and pelvis was performed following the standard protocol without IV contrast. RADIATION DOSE REDUCTION: This exam was performed according to the departmental dose-optimization program which includes automated exposure control, adjustment of the mA and/or kV according to patient size and/or use of iterative reconstruction technique. COMPARISON:  PET-CT 12/10/2023. CTA of the chest, abdomen and pelvis 11/20/2023. FINDINGS: Lower chest: Irregular right lower lobe pulmonary nodule measuring 3.2 x 2.6 cm on image 1/6 is incompletely visualized, although grossly unchanged from the previous CT. This was not significantly hypermetabolic on PET-CT. No enlarging nodules or confluent airspace opacities  are seen at the lung bases. There is linear scarring in the right lower lobe. Aortic and coronary artery atherosclerosis post median sternotomy. Hepatobiliary: No suspicious hepatic findings on noncontrast imaging. There is a stable cystic lesion in the left lobe on image 11/2. The gallbladder is surgically absent. No significant biliary dilatation. Pancreas: Unremarkable. No pancreatic ductal dilatation or  surrounding inflammatory changes. Spleen: Normal in size without focal abnormality. Adrenals/Urinary Tract: Both adrenal glands appear normal. No evidence of urinary tract calculus, hydronephrosis or perinephric soft tissue stranding. Mild renal cortical thinning bilaterally. The bladder appears unremarkable for its degree of distention. Stomach/Bowel: No tear contrast administered. The stomach appears unremarkable for its degree of distension. No evidence of bowel wall thickening, distention or surrounding inflammatory change. Status post appendectomy, per EPIC. Mild distal colonic diverticulosis. Vascular/Lymphatic: There are no enlarged abdominal or pelvic lymph nodes. Diffuse aortic and branch vessel atherosclerosis with bilateral iliac stents. Reproductive: Status post hysterectomy. No evidence of adnexal mass. Other: No evidence of abdominal wall mass or hernia. No ascites or pneumoperitoneum. Musculoskeletal: No acute or significant osseous findings. No evidence of osseous metastatic disease in the abdomen or pelvis. Mild spondylosis. IMPRESSION: 1. No acute findings or explanation for the patient's symptoms. 2. No evidence of metastatic disease in the abdomen or pelvis. 3. Grossly stable known right lower lobe pulmonary nodule, incompletely visualized. See PET-CT report 12/10/2023. 4.  Aortic Atherosclerosis (ICD10-I70.0). Electronically Signed   By: Elsie Perone M.D.   On: 02/14/2024 12:18      LOS: 2 days   Gordy CHRISTELLA Starch, MD 02/16/2024, 11:38 AM See TRACE[facility], [facility], to contact our on call provider

## 2024-02-16 NOTE — Plan of Care (Signed)

## 2024-02-17 ENCOUNTER — Encounter (HOSPITAL_COMMUNITY)
Admission: EM | Disposition: A | Discharge status: Home / Self Care | Payer: Self-pay | Source: Home / Self Care | Attending: Internal Medicine

## 2024-02-17 ENCOUNTER — Encounter (HOSPITAL_COMMUNITY): Payer: Self-pay | Admitting: Internal Medicine

## 2024-02-17 ENCOUNTER — Inpatient Hospital Stay (HOSPITAL_COMMUNITY)

## 2024-02-17 ENCOUNTER — Inpatient Hospital Stay (HOSPITAL_COMMUNITY): Admitting: Anesthesiology

## 2024-02-17 ENCOUNTER — Encounter: Payer: Self-pay | Admitting: Pulmonary Disease

## 2024-02-17 DIAGNOSIS — I130 Hypertensive heart and chronic kidney disease with heart failure and stage 1 through stage 4 chronic kidney disease, or unspecified chronic kidney disease: Secondary | ICD-10-CM | POA: Diagnosis not present

## 2024-02-17 DIAGNOSIS — D62 Acute posthemorrhagic anemia: Secondary | ICD-10-CM | POA: Diagnosis not present

## 2024-02-17 DIAGNOSIS — E785 Hyperlipidemia, unspecified: Secondary | ICD-10-CM | POA: Diagnosis not present

## 2024-02-17 DIAGNOSIS — N184 Chronic kidney disease, stage 4 (severe): Secondary | ICD-10-CM | POA: Diagnosis not present

## 2024-02-17 DIAGNOSIS — D649 Anemia, unspecified: Secondary | ICD-10-CM | POA: Diagnosis not present

## 2024-02-17 DIAGNOSIS — D509 Iron deficiency anemia, unspecified: Secondary | ICD-10-CM | POA: Diagnosis not present

## 2024-02-17 DIAGNOSIS — I5031 Acute diastolic (congestive) heart failure: Secondary | ICD-10-CM

## 2024-02-17 DIAGNOSIS — K5520 Angiodysplasia of colon without hemorrhage: Secondary | ICD-10-CM | POA: Diagnosis not present

## 2024-02-17 DIAGNOSIS — E875 Hyperkalemia: Secondary | ICD-10-CM

## 2024-02-17 HISTORY — PX: ENTEROSCOPY: SHX5533

## 2024-02-17 HISTORY — PX: HOT HEMOSTASIS: SHX5433

## 2024-02-17 LAB — CBC WITH DIFFERENTIAL/PLATELET
Abs Immature Granulocytes: 0.02 K/uL (ref 0.00–0.07)
Basophils Absolute: 0 K/uL (ref 0.0–0.1)
Basophils Relative: 1 %
Eosinophils Absolute: 0.6 K/uL — ABNORMAL HIGH (ref 0.0–0.5)
Eosinophils Relative: 9 %
HCT: 25.9 % — ABNORMAL LOW (ref 36.0–46.0)
Hemoglobin: 8.3 g/dL — ABNORMAL LOW (ref 12.0–15.0)
Immature Granulocytes: 0 %
Lymphocytes Relative: 32 %
Lymphs Abs: 2.2 K/uL (ref 0.7–4.0)
MCH: 29.5 pg (ref 26.0–34.0)
MCHC: 32 g/dL (ref 30.0–36.0)
MCV: 92.2 fL (ref 80.0–100.0)
Monocytes Absolute: 0.7 K/uL (ref 0.1–1.0)
Monocytes Relative: 10 %
Neutro Abs: 3.3 K/uL (ref 1.7–7.7)
Neutrophils Relative %: 48 %
Platelets: 200 K/uL (ref 150–400)
RBC: 2.81 MIL/uL — ABNORMAL LOW (ref 3.87–5.11)
RDW: 15.5 % (ref 11.5–15.5)
WBC: 6.8 K/uL (ref 4.0–10.5)
nRBC: 0 % (ref 0.0–0.2)

## 2024-02-17 LAB — BASIC METABOLIC PANEL WITH GFR
Anion gap: 6 (ref 5–15)
BUN: 34 mg/dL — ABNORMAL HIGH (ref 8–23)
CO2: 25 mmol/L (ref 22–32)
Calcium: 9.3 mg/dL (ref 8.9–10.3)
Chloride: 108 mmol/L (ref 98–111)
Creatinine, Ser: 1.7 mg/dL — ABNORMAL HIGH (ref 0.44–1.00)
GFR, Estimated: 30 mL/min — ABNORMAL LOW (ref >60)
Glucose, Bld: 83 mg/dL (ref 70–99)
Potassium: 5.3 mmol/L — ABNORMAL HIGH (ref 3.5–5.1)
Sodium: 139 mmol/L (ref 135–145)

## 2024-02-17 LAB — TYPE AND SCREEN
ABO/RH(D): A POS
Antibody Screen: POSITIVE
Unit division: 0

## 2024-02-17 LAB — BPAM RBC
Blood Product Expiration Date: 202508032359
ISSUE DATE / TIME: 202507041523
Unit Type and Rh: 6200

## 2024-02-17 SURGERY — ENTEROSCOPY
Anesthesia: Monitor Anesthesia Care

## 2024-02-17 MED ORDER — GLUCAGON HCL RDNA (DIAGNOSTIC) 1 MG IJ SOLR
INTRAMUSCULAR | Status: DC | PRN
  Administered 2024-02-17: .5 mg via INTRAVENOUS

## 2024-02-17 MED ORDER — SODIUM ZIRCONIUM CYCLOSILICATE 5 G PO PACK
5.0000 g | PACK | Freq: Two times a day (BID) | ORAL | Status: AC
  Administered 2024-02-17 (×2): 5 g via ORAL
  Filled 2024-02-17 (×2): qty 1

## 2024-02-17 MED ORDER — PROPOFOL 500 MG/50ML IV EMUL
INTRAVENOUS | Status: DC | PRN
  Administered 2024-02-17: 100 ug/kg/min via INTRAVENOUS
  Administered 2024-02-17: 40 mg via INTRAVENOUS
  Administered 2024-02-17: 20 mg via INTRAVENOUS

## 2024-02-17 MED ORDER — SODIUM CHLORIDE 0.9 % IV SOLN
INTRAVENOUS | Status: DC | PRN
Start: 2024-02-17 — End: 2024-02-17

## 2024-02-17 MED ORDER — GLUCAGON HCL RDNA (DIAGNOSTIC) 1 MG IJ SOLR
INTRAMUSCULAR | Status: AC
  Filled 2024-02-17: qty 1

## 2024-02-17 MED ORDER — EPHEDRINE SULFATE-NACL 50-0.9 MG/10ML-% IV SOSY
PREFILLED_SYRINGE | INTRAVENOUS | Status: DC | PRN
  Administered 2024-02-17: 5 mg via INTRAVENOUS

## 2024-02-17 MED ORDER — IPRATROPIUM-ALBUTEROL 0.5-2.5 (3) MG/3ML IN SOLN: 3.0000 mL | Freq: Three times a day (TID) | RESPIRATORY_TRACT | Status: DC

## 2024-02-17 MED ORDER — GLUCAGON HCL RDNA (DIAGNOSTIC) 1 MG IJ SOLR
INTRAMUSCULAR | Status: AC
Start: 2024-02-17 — End: 2024-02-17
  Filled 2024-02-17: qty 1

## 2024-02-17 MED ORDER — METHYLPREDNISOLONE SODIUM SUCC 40 MG IJ SOLR
40.0000 mg | Freq: Once | INTRAMUSCULAR | Status: AC
  Administered 2024-02-17: 40 mg via INTRAVENOUS
  Filled 2024-02-17: qty 1

## 2024-02-17 MED ORDER — PROPOFOL 10 MG/ML IV BOLUS
INTRAVENOUS | Status: AC
  Filled 2024-02-17: qty 20

## 2024-02-17 NOTE — Anesthesia Preprocedure Evaluation (Addendum)
 Anesthesia Evaluation  Patient identified by MRN, date of birth, ID band Patient awake    Reviewed: Allergy & Precautions, NPO status , Patient's Chart, lab work & pertinent test results  History of Anesthesia Complications (+) PONV and history of anesthetic complications  Airway Mallampati: III  TM Distance: >3 FB Neck ROM: Full    Dental  (+) Upper Dentures, Lower Dentures   Pulmonary asthma , COPD,  COPD inhaler and oxygen  dependent, former smoker  Lung cancer    Pulmonary exam normal        Cardiovascular hypertension, Pt. on medications + CAD, + Past MI, + CABG and + Peripheral Vascular Disease   Rhythm:Regular Rate:Bradycardia   '25 TTE - EF 65 to 70%. Mild mitral valve regurgitation.     Neuro/Psych  Headaches  Arnold-chiari malformation  TIA negative psych ROS   GI/Hepatic Neg liver ROS, hiatal hernia,GERD  Medicated and Controlled,,  Endo/Other   K 5.3   Renal/GU CRFRenal disease     Musculoskeletal negative musculoskeletal ROS (+)    Abdominal   Peds  Hematology  (+) Blood dyscrasia, anemia  On plavix     Anesthesia Other Findings   Reproductive/Obstetrics                              Anesthesia Physical Anesthesia Plan  ASA: 4  Anesthesia Plan: MAC   Post-op Pain Management: Minimal or no pain anticipated   Induction:   PONV Risk Score and Plan: 3 and Propofol  infusion and Treatment may vary due to age or medical condition  Airway Management Planned: Nasal Cannula and Natural Airway  Additional Equipment: None  Intra-op Plan:   Post-operative Plan:   Informed Consent: I have reviewed the patients History and Physical, chart, labs and discussed the procedure including the risks, benefits and alternatives for the proposed anesthesia with the patient or authorized representative who has indicated his/her understanding and acceptance.   Patient has DNR.   Discussed DNR with patient and Suspend DNR.     Plan Discussed with: CRNA and Anesthesiologist  Anesthesia Plan Comments:          Anesthesia Quick Evaluation

## 2024-02-17 NOTE — Op Note (Signed)
 [facility] Patient Name: [name] Procedure Date: 02/17/2024 MRN: [id number] Attending MD: Belvie Just , MD, [id number] Date of Birth: May 12, 1941 CSN: [id number] Age: 83 Admit Type: Outpatient Procedure:                Small bowel enteroscopy Indications:              Iron  deficiency anemia, Melena Providers:                Belvie Just, MD, Gregoria Pierce, RN Referring MD:              Medicines:                Propofol  per Anesthesia Complications:            No immediate complications. Estimated Blood Loss:     Estimated blood loss: none. Procedure:                Pre-Anesthesia Assessment:                           - Prior to the procedure, a History and Physical                            was performed, and patient medications and                            allergies were reviewed. The patient's tolerance of                            previous anesthesia was also reviewed. The risks                            and benefits of the procedure and the sedation                            options and risks were discussed with the patient.                            All questions were answered, and informed consent                            was obtained. Prior Anticoagulants: The patient has                            taken Plavix  (clopidogrel ), last dose was 4 days                            prior to procedure. ASA Grade Assessment: III - A                            patient with severe systemic disease. After                            reviewing the risks and benefits, the patient was  deemed in satisfactory condition to undergo the                            procedure.                           - Sedation was administered by an anesthesia                            professional. Deep sedation was attained.                           After obtaining informed consent, the endoscope was                            passed under direct  vision. Throughout the                            procedure, the patient's blood pressure, pulse, and                            oxygen  saturations were monitored continuously. The                            [id number]-[id number] ([id number] Olympus colonoscope was                            introduced through the mouth and advanced to the                            small bowel distal to the Ligament of Treitz. The                            small bowel enteroscopy was accomplished without                            difficulty. The patient tolerated the procedure                            well. Scope In: Scope Out: Findings:      The esophagus was normal.      The stomach was normal.      The examined duodenum was normal.      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 Coagulation for tissue destruction using monopolar probe was       successful.      Two deep passes in to the proximal jejunum were performed. At the site       of the tattoo, which was estimated to be just after the 4th portion of       the duodenum, two nonbleeding AVMs were found. One of the AVMs was       larger and the other was much smaller vascular lesion. Both were ablated       with APC. Impression:               -  Normal esophagus.                           - Normal stomach.                           - Normal examined duodenum.                           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 Treated with a monopolar probe.                           - No specimens collected. Recommendation:           - Return patient to hospital ward for ongoing care.                           - Resume regular diet.                           - Restart Plavix  in 2 days.                           - Follow up in the office in 2-4 weeks. Procedure Code(s):        --- Professional ---                           [phone], Small intestinal endoscopy,  enteroscopy                            beyond second portion of duodenum, not including                            ileum; with ablation of tumor(s), polyp(s), or                            other lesion(s) not amenable to removal by hot                            biopsy forceps, bipolar cautery or snare technique Diagnosis Code(s):        --- Professional ---                           K55.20, Angiodysplasia of colon without hemorrhage                           D50.9, Iron  deficiency anemia, unspecified                           K92.1, Melena (includes Hematochezia) CPT copyright 2022 [employer] Association. All rights reserved. The codes documented in this report are preliminary and upon coder review may  be revised to meet current compliance requirements. Belvie Just, MD Belvie Just, MD 02/17/2024 12:55:16 PM This  report has been signed electronically. Number of Addenda: 0

## 2024-02-17 NOTE — Interval H&P Note (Signed)
 History and Physical Interval Note:  02/17/2024 12:08 PM  [name]  has presented today for surgery, with the diagnosis of heme positive stools and symptomatic anemia.  The various methods of treatment have been discussed with the patient and family. After consideration of risks, benefits and other options for treatment, the patient has consented to  Procedure(s): ENTEROSCOPY (N/A) as a surgical intervention.  The patient's history has been reviewed, patient examined, no change in status, stable for surgery.  I have reviewed the patient's chart and labs.  Questions were answered to the patient's satisfaction.     [name]

## 2024-02-17 NOTE — Anesthesia Postprocedure Evaluation (Signed)
 Anesthesia Post Note  Patient: [name]  Procedure(s) Performed: ENTEROSCOPY EGD, WITH ARGON PLASMA COAGULATION     Patient location during evaluation: PACU Anesthesia Type: MAC Level of consciousness: awake and alert Pain management: pain level controlled Vital Signs Assessment: post-procedure vital signs reviewed and stable Respiratory status: spontaneous breathing, nonlabored ventilation, respiratory function stable and patient connected to nasal cannula oxygen  Cardiovascular status: stable and blood pressure returned to baseline Anesthetic complications: no   No notable events documented.  Last Vitals:  Vitals:   02/17/24 1315 02/17/24 1320  BP: (!) 136/28 (!) 151/32  Pulse: (!) 49 (!) 50  Resp: 19 19  Temp:    SpO2: 99% 99%    Last Pain:  Vitals:   02/17/24 1320  TempSrc:   PainSc: 1                  Debby FORBES Like

## 2024-02-17 NOTE — Transfer of Care (Signed)
 Immediate Anesthesia Transfer of Care Note  Patient: [name]  Procedure(s) Performed: ENTEROSCOPY EGD, WITH ARGON PLASMA COAGULATION  Patient Location: PACU  Anesthesia Type:MAC  Level of Consciousness: awake, alert , oriented, and patient cooperative  Airway & Oxygen  Therapy: Patient Spontanous Breathing and Patient connected to face mask oxygen   Post-op Assessment: Report given to RN and Post -op Vital signs reviewed and stable  Post vital signs: Reviewed and stable  Last Vitals:  Vitals Value Taken Time  BP    Temp    Pulse    Resp    SpO2      Last Pain:  Vitals:   02/17/24 1128  TempSrc:   PainSc: 0-No pain      Patients Stated Pain Goal: 3 (02/15/24 1705)  Complications: No notable events documented.

## 2024-02-17 NOTE — Progress Notes (Signed)
 PROGRESS NOTE    [name]  FMW:[id number] DOB: Sep 07, 1940 DOA: 02/14/2024 PCP: Pura Lenis, MD    Chief Complaint  Patient presents with   Abnormal Lab    Brief Narrative:  Patient is a 83 year old female history of hypertension, CKD stage III, hyperlipidemia, small intestinal angiectasia, COPD chronically on 3 L nasal cannula presented to the hospital with symptomatic anemia.   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  From in the past. -Patient noted being followed by GI, [name] in the outpatient setting. -Patient chronically on Plavix  which has been held since admission. - Hemoglobin on admission noted at 7.5, status post transfusion 1 unit PRBCs with hemoglobin currently at 8.3. - CT abdomen and pelvis with no significant acute findings. - Patient denies any melanotic stools. - Patient seen in consultation by GI who are recommending small bowel enteroscopy with Dr Rollin to be done today, 02/17/2024. - Currently n.p.o. in anticipation of procedure today.  - Follow H&H. - Transfusion threshold hemoglobin < 8. - GI following and appreciate input and recommendations.  2.  Right lower quadrant pain -CT abdomen pelvis with no acute findings to explain patient's right lower quadrant pain. - Per GI.  3.  CAD/PVD -Patient noted to be on Plavix  37.5 mg daily. - Continue to hold Plavix  secondary to problem 1. - Will defer resumption of Plavix  to GI.  4.  Hypertension -Patient noted to be off her Norvasc  prior to admission. -  Continue Nitropatch.   - Continue to hold ARB.    5.  GERD - PPI.  6.  Hyperlipidemia - Statin.  7.  AKI on CKD stage IIIb - Renal function trending down. -Continue to hold ARB. -Follow.  8.  Hyperkalemia -Potassium of 5.3. - Lokelma  5 mg p.o. twice daily x 1 day.   DVT prophylaxis: SCDs Code Status: DNR Family Communication: Updated patient.  Updated son at bedside.  Disposition: Home when clinically improved and cleared by GI.  Status is: Inpatient Remains inpatient appropriate because: Severity of illness   Consultants:  Gastroenterology: Dr. Albertus 02/14/2024  Procedures:  Transfusion 1 unit PRBC 02/14/2024 CT abdomen pelvis 02/14/2024   Antimicrobials:  Anti-infectives (From admission, onward)    None         Subjective: Sitting up in recliner awaiting upper endoscopy procedure today.  Denies any chest pain or shortness of breath.  No abdominal pain.  Has not had a bowel movement in the past 2 days.  Son at bedside.    Objective: Vitals:   02/16/24 2200 02/16/24 2243 02/17/24 0610 02/17/24 0756  BP: (!) 175/44 (!) 138/39 (!) 134/36   Pulse: (!) 46 (!) 50 (!) 48   Resp:   16   Temp:   98 F (36.7 C)   TempSrc:   Oral   SpO2:   100% 99%  Weight:      Height:       No intake or output data in the 24 hours ending 02/17/24 1126  Filed Weights   02/14/24 0848  Weight: 63.5  kg    Examination:  General exam: NAD. Respiratory system: CTAB.  No wheezes, no crackles, no rhonchi.  Fair air movement.  Speaking in full sentences.  Cardiovascular system: RRR with no murmurs rubs or gallops.  No JVD.  No pitting lower extremity edema.   Gastrointestinal system: Abdomen is soft, nondistended, positive bowel sounds, no rebound, no guarding.  Decreased tenderness to palpation in the right lower quadrant.  Central nervous system: Alert and oriented.  Moving extremities spontaneously.  No focal neurological deficits. Extremities: Symmetric 5 x 5 power. Skin: No  rashes, lesions or ulcers Psychiatry: Judgement and insight appear normal. Mood & affect appropriate.     Data Reviewed: I have personally reviewed following labs and imaging studies  CBC: Recent Labs  Lab 02/13/24 1601 02/14/24 0921 02/14/24 2036 02/15/24 0509 02/16/24 1100 02/17/24 0513  WBC 7.6 6.1  --  6.5 6.8 6.8  NEUTROABS 3,914  --   --   --  3.9 3.3  HGB 7.9* 7.5* 8.8* 8.7* 8.8* 8.3*  HCT 24.9* 24.3* 26.5* 27.6* 27.8* 25.9*  MCV 89.6 90.7  --  91.4 92.1 92.2  PLT 285 266  --  205 202 200    Basic Metabolic Panel: Recent Labs  Lab 02/13/24 1601 02/14/24 0921 02/15/24 0509 02/16/24 1100 02/17/24 0513  NA 138 142 141 138 139  K 4.8 4.2 4.5 5.1 5.3*  CL 103 109 109 107 108  CO2 24 23 24 25 25   GLUCOSE 103* 98 96 96 83  BUN 42* 43* 36* 36* 34*  CREATININE 2.60* 2.42* 2.19* 1.79* 1.70*  CALCIUM  9.1 9.0 8.9 9.2 9.3    GFR: Estimated Creatinine Clearance: 22.7 mL/min (A) (by C-G formula based on SCr of 1.7 mg/dL (H)).  Liver Function Tests: Recent Labs  Lab 02/14/24 0921  AST 19  ALT 8  ALKPHOS 62  BILITOT 0.6  PROT 6.9  ALBUMIN  3.7    CBG: No results for input(s): GLUCAP in the last 168 hours.   No results found for this or any previous visit (from the past 240 hours).       Radiology Studies: No results found.       Scheduled Meds:  [MAR Hold] atorvastatin   20 mg Oral QHS   [MAR Hold] budesonide -glycopyrrolate -formoterol   2 puff Inhalation BID   [MAR Hold] nitroGLYCERIN   0.2 mg Transdermal Daily   [MAR Hold] pantoprazole   40 mg Oral Q0600   [MAR Hold] sodium zirconium cyclosilicate   5 g Oral BID   Continuous Infusions:   LOS: 3 days    Time spent: 40 mins    Toribio Hummer, MD [facility]   To contact the attending provider between 7A-7P or the covering provider during after hours 7P-7A, please log into the web site [email or web address] and access using universal [place] password for that web site. If you do not  have the password, please call the hospital operator.  02/17/2024, 11:26 AM

## 2024-02-17 NOTE — Progress Notes (Signed)
 Mobility Specialist - Progress Note   02/17/24 0904  Mobility  Activity Transferred from bed to chair  Level of Assistance Modified independent, requires aide device or extra time  Assistive Device None  Distance Ambulated (ft) 2 ft  Activity Response Tolerated well  Mobility Referral Yes  Mobility visit 1 Mobility  Mobility Specialist Start Time (ACUTE ONLY) 0858  Mobility Specialist Stop Time (ACUTE ONLY) 0904  Mobility Specialist Time Calculation (min) (ACUTE ONLY) 6 min   Pt received in bed and agreeable to transfer to recliner to self bathe. No complaints during transfer. Pt to recliner after session with all needs met.   [facility]

## 2024-02-18 ENCOUNTER — Encounter: Payer: Self-pay | Admitting: Physician Assistant

## 2024-02-18 ENCOUNTER — Encounter: Payer: Self-pay | Admitting: Internal Medicine

## 2024-02-18 ENCOUNTER — Encounter (HOSPITAL_COMMUNITY): Payer: Self-pay | Admitting: Gastroenterology

## 2024-02-18 ENCOUNTER — Telehealth: Payer: Self-pay | Admitting: Nurse Practitioner

## 2024-02-18 ENCOUNTER — Other Ambulatory Visit (HOSPITAL_COMMUNITY): Payer: Self-pay

## 2024-02-18 DIAGNOSIS — E785 Hyperlipidemia, unspecified: Secondary | ICD-10-CM | POA: Diagnosis not present

## 2024-02-18 DIAGNOSIS — D62 Acute posthemorrhagic anemia: Secondary | ICD-10-CM | POA: Diagnosis not present

## 2024-02-18 DIAGNOSIS — D649 Anemia, unspecified: Secondary | ICD-10-CM | POA: Diagnosis not present

## 2024-02-18 DIAGNOSIS — I2510 Atherosclerotic heart disease of native coronary artery without angina pectoris: Secondary | ICD-10-CM | POA: Diagnosis not present

## 2024-02-18 DIAGNOSIS — R062 Wheezing: Secondary | ICD-10-CM

## 2024-02-18 LAB — CBC
HCT: 27.8 % — ABNORMAL LOW (ref 36.0–46.0)
Hemoglobin: 8.6 g/dL — ABNORMAL LOW (ref 12.0–15.0)
MCH: 28.9 pg (ref 26.0–34.0)
MCHC: 30.9 g/dL (ref 30.0–36.0)
MCV: 93.3 fL (ref 80.0–100.0)
Platelets: 208 K/uL (ref 150–400)
RBC: 2.98 MIL/uL — ABNORMAL LOW (ref 3.87–5.11)
RDW: 15.4 % (ref 11.5–15.5)
WBC: 7.5 K/uL (ref 4.0–10.5)
nRBC: 0 % (ref 0.0–0.2)

## 2024-02-18 LAB — BASIC METABOLIC PANEL WITH GFR
Anion gap: 8 (ref 5–15)
Anion gap: 12 (ref 5–15)
BUN: 35 mg/dL — ABNORMAL HIGH (ref 8–23)
BUN: 41 mg/dL — ABNORMAL HIGH (ref 8–23)
CO2: 22 mmol/L (ref 22–32)
CO2: 21 mmol/L — ABNORMAL LOW (ref 22–32)
Calcium: 9.3 mg/dL (ref 8.9–10.3)
Calcium: 9.1 mg/dL (ref 8.9–10.3)
Chloride: 109 mmol/L (ref 98–111)
Chloride: 105 mmol/L (ref 98–111)
Creatinine, Ser: 1.6 mg/dL — ABNORMAL HIGH (ref 0.44–1.00)
Creatinine, Ser: 1.66 mg/dL — ABNORMAL HIGH (ref 0.44–1.00)
GFR, Estimated: 32 mL/min — ABNORMAL LOW (ref >60)
GFR, Estimated: 31 mL/min — ABNORMAL LOW (ref >60)
Glucose, Bld: 150 mg/dL — ABNORMAL HIGH (ref 70–99)
Glucose, Bld: 141 mg/dL — ABNORMAL HIGH (ref 70–99)
Potassium: 5.3 mmol/L — ABNORMAL HIGH (ref 3.5–5.1)
Potassium: 4.4 mmol/L (ref 3.5–5.1)
Sodium: 139 mmol/L (ref 135–145)
Sodium: 138 mmol/L (ref 135–145)

## 2024-02-18 LAB — CBC WITH DIFFERENTIAL/PLATELET
Abs Immature Granulocytes: 0.02 K/uL (ref 0.00–0.07)
Basophils Absolute: 0 K/uL (ref 0.0–0.1)
Basophils Relative: 0 %
Eosinophils Absolute: 0 K/uL (ref 0.0–0.5)
Eosinophils Relative: 0 %
HCT: 27.3 % — ABNORMAL LOW (ref 36.0–46.0)
Hemoglobin: 8.6 g/dL — ABNORMAL LOW (ref 12.0–15.0)
Immature Granulocytes: 0 %
Lymphocytes Relative: 10 %
Lymphs Abs: 0.5 K/uL — ABNORMAL LOW (ref 0.7–4.0)
MCH: 29 pg (ref 26.0–34.0)
MCHC: 31.5 g/dL (ref 30.0–36.0)
MCV: 91.9 fL (ref 80.0–100.0)
Monocytes Absolute: 0.1 K/uL (ref 0.1–1.0)
Monocytes Relative: 1 %
Neutro Abs: 4.5 K/uL (ref 1.7–7.7)
Neutrophils Relative %: 89 %
Platelets: 197 K/uL (ref 150–400)
RBC: 2.97 MIL/uL — ABNORMAL LOW (ref 3.87–5.11)
RDW: 15.2 % (ref 11.5–15.5)
WBC: 5.1 K/uL (ref 4.0–10.5)
nRBC: 0 % (ref 0.0–0.2)

## 2024-02-18 LAB — MAGNESIUM: Magnesium: 1.9 mg/dL (ref 1.7–2.4)

## 2024-02-18 MED ORDER — FUROSEMIDE 20 MG PO TABS
20.0000 mg | ORAL_TABLET | Freq: Every morning | ORAL | Status: DC
  Administered 2024-02-18: 20 mg via ORAL
  Filled 2024-02-18: qty 1

## 2024-02-18 MED ORDER — PREDNISONE 20 MG PO TABS: 40.0000 mg | ORAL_TABLET | Freq: Every day | ORAL | Status: DC

## 2024-02-18 MED ORDER — PREDNISONE 20 MG PO TABS
ORAL_TABLET | ORAL | 0 refills | Status: AC
  Filled 2024-02-18: qty 12, 8d supply, fill #0

## 2024-02-18 MED ORDER — LIDOCAINE 5 % EX PTCH
1.0000 | MEDICATED_PATCH | CUTANEOUS | Status: DC
  Administered 2024-02-18: 1 via TRANSDERMAL
  Filled 2024-02-18: qty 1

## 2024-02-18 MED ORDER — LIDOCAINE 5 % EX PTCH
1.0000 | MEDICATED_PATCH | CUTANEOUS | 0 refills | Status: AC
  Filled 2024-02-18: qty 7, 7d supply, fill #0

## 2024-02-18 MED ORDER — IPRATROPIUM-ALBUTEROL 0.5-2.5 (3) MG/3ML IN SOLN
3.0000 mL | Freq: Three times a day (TID) | RESPIRATORY_TRACT | Status: DC
  Administered 2024-02-18 (×2): 3 mL via RESPIRATORY_TRACT
  Filled 2024-02-18 (×2): qty 3

## 2024-02-18 MED ORDER — ALBUTEROL SULFATE (2.5 MG/3ML) 0.083% IN NEBU
INHALATION_SOLUTION | RESPIRATORY_TRACT | 0 refills | Status: AC
  Filled 2024-02-18: qty 75, 17d supply, fill #0

## 2024-02-18 MED ORDER — SODIUM ZIRCONIUM CYCLOSILICATE 10 G PO PACK
10.0000 g | PACK | Freq: Once | ORAL | Status: AC
  Administered 2024-02-18: 10 g via ORAL
  Filled 2024-02-18: qty 1

## 2024-02-18 MED ORDER — ONDANSETRON HCL 4 MG PO TABS
4.0000 mg | ORAL_TABLET | Freq: Four times a day (QID) | ORAL | 0 refills | Status: AC | PRN
  Filled 2024-02-18: qty 20, 5d supply, fill #0

## 2024-02-18 NOTE — Discharge Summary (Signed)
 Physician Discharge Summary  [name] FMW:[id number] DOB: 09/19/1940 DOA: 02/14/2024  PCP: Pura Lenis, MD  Admit date: 02/14/2024 Discharge date: 02/18/2024  Time spent: 60 minutes  Recommendations for Outpatient Follow-up:  Follow-up with [name] as scheduled. Follow-up with Pura Lenis, MD in 2 weeks.  On follow-up patient will need a basic metabolic profile done to follow-up on electrolytes and renal function.  Patient also need a CBC done to follow-up on counts.  Patient's ARB was held until follow-up with PCP at which point in time we will determine whether it may be resumed.   Discharge Diagnoses:  Principal Problem:   Acute blood loss anemia (ABLA) Active Problems:   Symptomatic anemia   PVD (peripheral vascular disease) (HCC)   CAD (coronary artery disease)   HLD (hyperlipidemia)   AKI (acute kidney injury) (HCC)   Iron  deficiency anemia   Chronic respiratory failure with hypoxia (HCC)   Right lower quadrant abdominal pain   Wheezing   Discharge Condition: Stable and improved.  Diet recommendation: Heart healthy  Filed Weights   02/14/24 0848 02/17/24 1128  Weight: 63.5 kg 63.5 kg    History of present illness:  HPI per Dr. Zella Elveria [name] is a 83 y.o. female with medical history significant for hypertension, CKD stage III, hyperlipidemia, small intestinal angiectasia, COPD chronically on 3 L nasal cannula oxygen  being admitted to the hospital with symptomatic anemia.  She has been feeling more dyspneic and weak of late, followed up as an outpatient with pulmonology recently workup was relatively unremarkable, however lab work was done and it returned today showing worsening anemia.  She states that for the last couple of weeks her stools may be a little bit darker than previously, but not black or pasty.  She has had some mild nausea without vomiting, and has experienced right lower quadrant abdominal tenderness for the last couple of weeks.  She was  advised to come to the ER for evaluation of her acute on chronic anemia.   Hospital Course:  #1 symptomatic anemia/chronic blood loss anemia -Found likely secondary to small bowel angiectasia's that have required ablation as noted in the past.. -Patient noted being followed by GI, [name] in the outpatient setting. -Patient chronically on Plavix  which has been held since admission. - Hemoglobin on admission noted at 7.5, status post transfusion 1 unit PRBCs with hemoglobin stabilizing at 8.6 by day of discharge. - CT abdomen and pelvis with no significant acute findings. - Patient denied any melanotic stools during the hospitalization. - Patient seen in consultation by GI who recommended small bowel enteroscopy with Dr Rollin which was done on 02/17/2024.  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  - Patient's diet was advanced and hemoglobin followed which remained stable.  - Patient was cleared by GI for outpatient follow-up as scheduled in 1 week.  - Patient improved clinically and patient was discharged in stable and improved condition.  - Outpatient follow-up with GI.    2.  Right lower quadrant pain -CT abdomen pelvis with no acute findings to explain patient's right lower quadrant pain. -Patient placed on a Lidoderm  patch. -Outpatient follow-up with PCP.   3.  CAD/PVD -Patient noted to be on Plavix  37.5 mg daily. - Plavix  held during the hospitalization secondary to problem #1.   -Post workup and post small bowel endoscopy it was recommended by GI that Plavix  may be  resumed 1 day postdischarge on 02/19/2024.   4.  Hypertension -Patient noted to be off her Norvasc  prior to admission. - Placed on Nitropatch.   - ARB held and will not be resumed until follow-up with PCP.   - Patient resumed back on home regimen Lasix .   - Outpatient follow-up with PCP.    5.   GERD - Patient maintained on a PPI.   6.  Hyperlipidemia - Patient maintained on home regimen statin.   7.  AKI on CKD stage IIIb - Renal function trended down during the hospitalization. - Patient's ARB was held and will be held until follow-up with PCP.    8.  Hyperkalemia - Patient noted to have been on ARB prior to admission.   - ARB held during the hospitalization.   - Patient received Lokelma  during hospitalization with resolution of hyperkalemia. - Outpatient follow-up with PCP.  9.  Wheezing -Patient noted to have bouts of wheezing postprocedure. -Patient placed on steroids, placed on nebulizer treatments with clinical improvement. -Patient will be discharged on a steroid taper. -Outpatient follow-up with PCP.      Procedures: Transfusion 1 unit PRBC 02/14/2024 CT abdomen pelvis 02/14/2024 Small bowel endoscopy 02/17/2024 per [name]  Consultations: Gastroenterology: Dr. Albertus 02/14/2024    Discharge Exam: Vitals:   02/18/24 1325 02/18/24 1556  BP:  (!) 157/50  Pulse:  65  Resp:  16  Temp:  98.3 F (36.8 C)  SpO2: 99% 100%    General: NAD Cardiovascular: RRR no murmurs rubs or gallops.  No JVD.  No lower extremity edema. Respiratory: Clear to auscultation bilaterally.  No wheezes, no crackles, no rhonchi.  Fair air movement.  Speaking in full sentences.  Discharge Instructions   Discharge Instructions     Diet - low sodium heart healthy   Complete by: As directed    Increase activity slowly   Complete by: As directed       Allergies as of 02/18/2024       Reactions   Avelox [moxifloxacin Hcl In Nacl] Palpitations, Other (See Comments)   Caused Heart Attack    Azithromycin  Swelling, Other (See Comments)   Patient reported past history of lip swelling   Codeine Other (See Comments)   Dr. Levern advised patient not to take this medication   Doxycycline  Swelling, Other (See Comments)   Mouth, lips, feet swelling   Hydromorphone  Palpitations, Other  (See Comments)   DILAUDID   -  Pt had a Heart Attack after taking Dilaudid .   Levaquin  [levofloxacin ] Shortness Of Breath, Other (See Comments)   Chest pressure, SOB, pain in between shoulder blades, sweaty -as reported by patient per experience in ED this afternoon   Vitamin D Analogs Swelling, Other (See Comments)   Face and lips swell, but no breathing issues   Nifedipine Er Other (See Comments)   Dropped the heart rate too low   Octreotide     Chest pain, N/V   Zetia [ezetimibe] Other (See Comments)   Made me feel like my face was going to sleep/numb   Oxycodone-acetaminophen  Other (See Comments)   Says it makes her feel weird   Risedronate Other (See Comments)   Chest pain        Medication List     PAUSE taking these medications    clopidogrel  75 MG tablet Wait to take this until: February 19, 2024 Commonly known as: PLAVIX  Take 0.5 tablets (37.5 mg total) by mouth in the morning.   olmesartan  20 MG tablet  Wait to take this until your doctor or other care provider tells you to start again. Commonly known as: Benicar  Take 1 tablet (20 mg total) by mouth daily.       STOP taking these medications    amLODipine  2.5 MG tablet Commonly known as: NORVASC        TAKE these medications    acetaminophen  500 MG tablet Commonly known as: TYLENOL  Take 500-1,000 mg by mouth every 6 (six) hours as needed (for pain).   albuterol  108 (90 Base) MCG/ACT inhaler Commonly known as: VENTOLIN  HFA Inhale 2 puffs into the lungs every 6 (six) hours as needed for wheezing or shortness of breath. What changed: Another medication with the same name was changed. Make sure you understand how and when to take each.   albuterol  (2.5 MG/3ML) 0.083% nebulizer solution Commonly known as: PROVENTIL  Take 3 mLs (2.5 mg total) by nebulization 3 (three) times daily for 5 days, THEN 3 mLs (2.5 mg total) every 6 (six) hours as needed for wheezing or shortness of breath. Start taking on: February 18, 2024 What changed: See the new instructions.   atorvastatin  40 MG tablet Commonly known as: LIPITOR Take 1 tablet (40 mg total) by mouth at bedtime.   cetirizine  10 MG tablet Commonly known as: ZYRTEC  Take 10 mg by mouth daily as needed for allergies.   dexlansoprazole  60 MG capsule Commonly known as: DEXILANT  Take 60 mg by mouth daily before breakfast.   furosemide  40 MG tablet Commonly known as: LASIX  Take 20 mg by mouth in the morning.   lidocaine  5 % Commonly known as: LIDODERM  Place 1 patch onto the skin daily for 7 days. Remove & Discard patch within 12 hours or as directed by MD Start taking on: February 19, 2024   nitroGLYCERIN  0.4 MG SL tablet Commonly known as: NITROSTAT  Place 1 tablet (0.4 mg total) under the tongue every 5 (five) minutes x 3 doses as needed for chest pain.   nitroGLYCERIN  0.2 mg/hr patch Commonly known as: NITRODUR - Dosed in mg/24 hr Place 0.2 mg onto the skin daily. Patient wears patch for 12 hours and removes   ondansetron  4 MG tablet Commonly known as: ZOFRAN  Take 1 tablet (4 mg total) by mouth every 6 (six) hours as needed for nausea.   predniSONE  20 MG tablet Commonly known as: DELTASONE  Take 2 tablets (40 mg total) by mouth daily before breakfast for 4 days, THEN 1 tablet (20 mg total) daily before breakfast for 4 days. Start taking on: February 19, 2024 What changed:  medication strength See the new instructions.   Trelegy Ellipta  200-62.5-25 MCG/ACT Aepb Generic drug: Fluticasone -Umeclidin-Vilant USE 1 INHALATION BY MOUTH DAILY       Allergies  Allergen Reactions   Avelox [Moxifloxacin Hcl In Nacl] Palpitations and Other (See Comments)    Caused Heart Attack    Azithromycin  Swelling and Other (See Comments)    Patient reported past history of lip swelling   Codeine Other (See Comments)    Dr. Levern advised patient not to take this medication   Doxycycline  Swelling and Other (See Comments)    Mouth, lips, feet swelling    Hydromorphone  Palpitations and Other (See Comments)    DILAUDID   -  Pt had a Heart Attack after taking Dilaudid .   Levaquin  [Levofloxacin ] Shortness Of Breath and Other (See Comments)    Chest pressure, SOB, pain in between shoulder blades, sweaty -as reported by patient per experience in ED this afternoon   Vitamin  D Analogs Swelling and Other (See Comments)    Face and lips swell, but no breathing issues   Nifedipine Er Other (See Comments)    Dropped the heart rate too low   Octreotide      Chest pain, N/V   Zetia [Ezetimibe] Other (See Comments)    Made me feel like my face was going to sleep/numb   Oxycodone-Acetaminophen  Other (See Comments)    Says it makes her feel weird   Risedronate Other (See Comments)    Chest pain    Follow-up Information     [facility]  Follow up.   Contact information: [address] [place] [address] [phone]         Pura Lenis, MD. Schedule an appointment as soon as possible for a visit in 2 week(s).   Specialty: Family Medicine Why: Follow-up in 2 weeks as previously scheduled. Contact information: [address] [facility] [address] [phone]         Rollin Dover, MD Follow up on 02/24/2024.   Specialty: Gastroenterology Why: Follow-up as scheduled with [name]. Contact information: [address] [place] [address] [place] [address] [phone]                  The results of significant diagnostics from this hospitalization (including imaging, microbiology, ancillary and laboratory) are listed below for reference.    Significant Diagnostic Studies: DG CHEST PORT 1 VIEW Result Date: 02/17/2024 CLINICAL DATA:  10031 Cough 10031 EXAM: PORTABLE CHEST 1 VIEW COMPARISON:  PET CT 12/10/2023, chest x-ray 02/13/2024 FINDINGS: The heart and mediastinal contours are grossly unchanged given AP portable technique. Right lower lobe mass measuring 3.6 x 1.2 cm  better evaluated on PET CT 12/10/2023. No pulmonary edema. No pleural effusion. No pneumothorax. No acute osseous abnormality.  Sternotomy wires are intact. IMPRESSION: 1. No active disease. 2. Right lower lobe mass measuring 3.6 x 1.2 cm better evaluated on PET CT 12/10/2023. Electronically Signed   By: Morgane  Naveau M.D.   On: 02/17/2024 20:45   CT ABDOMEN PELVIS WO CONTRAST Result Date: 02/14/2024 CLINICAL DATA:  Right lower quadrant abdominal pain. Chronic anemia. History of non-small cell lung cancer. EXAM: CT ABDOMEN AND PELVIS WITHOUT CONTRAST TECHNIQUE: Multidetector CT imaging of the abdomen and pelvis was performed following the standard protocol without IV contrast. RADIATION DOSE REDUCTION: This exam was performed according to the departmental dose-optimization program which includes automated exposure control, adjustment of the mA and/or kV according to patient size and/or use of iterative reconstruction technique. COMPARISON:  PET-CT 12/10/2023. CTA of the chest, abdomen and pelvis 11/20/2023. FINDINGS: Lower chest: Irregular right lower lobe pulmonary nodule measuring 3.2 x 2.6 cm on image 1/6 is incompletely visualized, although grossly unchanged from the previous CT. This was not significantly hypermetabolic on PET-CT. No enlarging nodules or confluent airspace opacities are seen at the lung bases. There is linear scarring in the right lower lobe. Aortic and coronary artery atherosclerosis post median sternotomy. Hepatobiliary: No suspicious hepatic findings on noncontrast imaging. There is a stable cystic lesion in the left lobe on image 11/2. The gallbladder is surgically absent. No significant biliary dilatation. Pancreas: Unremarkable. No pancreatic ductal dilatation or surrounding inflammatory changes. Spleen: Normal in size without focal abnormality. Adrenals/Urinary Tract: Both adrenal glands appear normal. No evidence of urinary tract calculus, hydronephrosis or perinephric soft tissue  stranding. Mild renal cortical thinning bilaterally. The bladder appears unremarkable for its degree of distention. Stomach/Bowel: No tear  contrast administered. The stomach appears unremarkable for its degree of distension. No evidence of bowel wall thickening, distention or surrounding inflammatory change. Status post appendectomy, per EPIC. Mild distal colonic diverticulosis. Vascular/Lymphatic: There are no enlarged abdominal or pelvic lymph nodes. Diffuse aortic and branch vessel atherosclerosis with bilateral iliac stents. Reproductive: Status post hysterectomy. No evidence of adnexal mass. Other: No evidence of abdominal wall mass or hernia. No ascites or pneumoperitoneum. Musculoskeletal: No acute or significant osseous findings. No evidence of osseous metastatic disease in the abdomen or pelvis. Mild spondylosis. IMPRESSION: 1. No acute findings or explanation for the patient's symptoms. 2. No evidence of metastatic disease in the abdomen or pelvis. 3. Grossly stable known right lower lobe pulmonary nodule, incompletely visualized. See PET-CT report 12/10/2023. 4.  Aortic Atherosclerosis (ICD10-I70.0). Electronically Signed   By: Elsie Perone M.D.   On: 02/14/2024 12:18   DG Chest 2 View Result Date: 02/13/2024 CLINICAL DATA:  Dyspnea on exertion. EXAM: CHEST - 2 VIEW COMPARISON:  CT angiography chest from 11/20/2023. FINDINGS: There is a heterogeneous nonspecific approximately 2.1 x 3.1 cm opacity overlying the right lower lung zone, grossly similar to prior study. Please refer to CT scan chest report from 11/20/2023 for details. Bilateral lung fields are otherwise clear. No acute consolidation or lung collapse. Bilateral costophrenic angles are clear. Normal cardio-mediastinal silhouette. There are surgical staples along the heart border and sternotomy wires, status post CABG (coronary artery bypass graft). No acute osseous abnormalities. The soft tissues are within normal limits. There are surgical  clips in the right upper quadrant, typical of a previous cholecystectomy. IMPRESSION: No acute cardiopulmonary abnormality. Electronically Signed   By: Ree Molt M.D.   On: 02/13/2024 17:02    Microbiology: No results found for this or any previous visit (from the past 240 hours).   Labs: Basic Metabolic Panel: Recent Labs  Lab 02/15/24 0509 02/16/24 1100 02/17/24 0513 02/18/24 0500 02/18/24 1443  NA 141 138 139 139 138  K 4.5 5.1 5.3* 5.3* 4.4  CL 109 107 108 109 105  CO2 24 25 25 22  21*  GLUCOSE 96 96 83 150* 141*  BUN 36* 36* 34* 35* 41*  CREATININE 2.19* 1.79* 1.70* 1.60* 1.66*  CALCIUM  8.9 9.2 9.3 9.3 9.1  MG  --   --   --  1.9  --    Liver Function Tests: Recent Labs  Lab 02/14/24 0921  AST 19  ALT 8  ALKPHOS 62  BILITOT 0.6  PROT 6.9  ALBUMIN  3.7   No results for input(s): LIPASE, AMYLASE in the last 168 hours. No results for input(s): AMMONIA in the last 168 hours. CBC: Recent Labs  Lab 02/13/24 1601 02/14/24 0921 02/15/24 0509 02/16/24 1100 02/17/24 0513 02/18/24 0500 02/18/24 1443  WBC 7.6   < > 6.5 6.8 6.8 5.1 7.5  NEUTROABS 3,914  --   --  3.9 3.3 4.5  --   HGB 7.9*   < > 8.7* 8.8* 8.3* 8.6* 8.6*  HCT 24.9*   < > 27.6* 27.8* 25.9* 27.3* 27.8*  MCV 89.6   < > 91.4 92.1 92.2 91.9 93.3  PLT 285   < > 205 202 200 197 208   < > = values in this interval not displayed.   Cardiac Enzymes: No results for input(s): CKTOTAL, CKMB, CKMBINDEX, TROPONINI in the last 168 hours. BNP: BNP (last 3 results) Recent Labs    02/13/24 1601  BNP 117*    ProBNP (last 3 results) No results  for input(s): PROBNP in the last 8760 hours.  CBG: No results for input(s): GLUCAP in the last 168 hours.     Signed:  Toribio Hummer MD.  [facility] 02/18/2024, 5:20 PM

## 2024-02-18 NOTE — Telephone Encounter (Signed)
 Could you please verify what a head probe is? I sent it to [facility] and they do not carry it nor do they know what it is. Please advise. Thank you!

## 2024-02-18 NOTE — Progress Notes (Signed)
 Subjective: Feeling well.  No complaints.  No reports of any melena or hematochezia.  Objective: Vital signs in last 24 hours: Temp:  [97.8 F (36.6 C)-98.9 F (37.2 C)] 98.6 F (37 C) (07/08 0509) Pulse Rate:  [53-55] 54 (07/08 0509) Resp:  [16-20] 17 (07/08 0509) BP: (128-161)/(39-56) 143/46 (07/08 0509) SpO2:  [98 %-100 %] 99 % (07/08 1325) Last BM Date : 02/15/24  Intake/Output from previous day: 07/07 0701 - 07/08 0700 In: 660 [P.O.:460; I.V.:200] Out: 0  Intake/Output this shift: Total I/O In: 298 [P.O.:298] Out: -   General appearance: alert and no distress GI: soft, non-tender; bowel sounds normal; no masses,  no organomegaly  Lab Results: Recent Labs    02/16/24 1100 02/17/24 0513 02/18/24 0500  WBC 6.8 6.8 5.1  HGB 8.8* 8.3* 8.6*  HCT 27.8* 25.9* 27.3*  PLT 202 200 197   BMET Recent Labs    02/16/24 1100 02/17/24 0513 02/18/24 0500  NA 138 139 139  K 5.1 5.3* 5.3*  CL 107 108 109  CO2 25 25 22   GLUCOSE 96 83 150*  BUN 36* 34* 35*  CREATININE 1.79* 1.70* 1.60*  CALCIUM  9.2 9.3 9.3   LFT No results for input(s): PROT, ALBUMIN , AST, ALT, ALKPHOS, BILITOT, BILIDIR, IBILI in the last 72 hours. PT/INR No results for input(s): LABPROT, INR in the last 72 hours. Hepatitis Panel No results for input(s): HEPBSAG, HCVAB, HEPAIGM, HEPBIGM in the last 72 hours. C-Diff No results for input(s): CDIFFTOX in the last 72 hours. Fecal Lactopherrin No results for input(s): FECLLACTOFRN in the last 72 hours.  Studies/Results: DG CHEST PORT 1 VIEW Result Date: 02/17/2024 CLINICAL DATA:  10031 Cough 10031 EXAM: PORTABLE CHEST 1 VIEW COMPARISON:  PET CT 12/10/2023, chest x-ray 02/13/2024 FINDINGS: The heart and mediastinal contours are grossly unchanged given AP portable technique. Right lower lobe mass measuring 3.6 x 1.2 cm better evaluated on PET CT 12/10/2023. No pulmonary edema. No pleural effusion. No pneumothorax. No acute  osseous abnormality.  Sternotomy wires are intact. IMPRESSION: 1. No active disease. 2. Right lower lobe mass measuring 3.6 x 1.2 cm better evaluated on PET CT 12/10/2023. Electronically Signed   By: Morgane  Naveau M.D.   On: 02/17/2024 20:45    Medications: Scheduled:  atorvastatin   20 mg Oral QHS   budesonide -glycopyrrolate -formoterol   2 puff Inhalation BID   furosemide   20 mg Oral q AM   ipratropium-albuterol   3 mL Nebulization TID   lidocaine   1 patch Transdermal Q24H   nitroGLYCERIN   0.2 mg Transdermal Daily   pantoprazole   40 mg Oral Q0600   [START ON 02/19/2024] predniSONE   40 mg Oral QAC breakfast   Continuous:  Assessment/Plan: 1) Small bowel AVMs s/p APC. 2) Anemia - stable.   The patient is well.  She just had blood drawn. If stable, she can be discharged home.  Plan: 1) Await CBC. 2) Okay to D/C home if stable.  LOS: 4 days   Karrine Kluttz D 02/18/2024, 2:47 PM

## 2024-02-18 NOTE — Plan of Care (Signed)

## 2024-02-18 NOTE — Progress Notes (Signed)
 TOC meds in secure bag delivered to pt in room by this RN

## 2024-02-18 NOTE — Progress Notes (Signed)
 AVS reviewed w/ patient who verbalized an understanding- Pt had lidocaine  patches & albuterol  at home, pt refused at this time. Other TOC meds delivered as noted in a secure bag.  PIV removed by primary nurse. Pt dressed for d/c to home. Pt's son is in room - pt connected self to O2 travel concentrator. Pt to lobby via w/c - home w/ son in car

## 2024-02-20 ENCOUNTER — Telehealth (HOSPITAL_BASED_OUTPATIENT_CLINIC_OR_DEPARTMENT_OTHER): Payer: Self-pay

## 2024-02-20 NOTE — Telephone Encounter (Signed)
 Sent a community message regarding this.

## 2024-02-20 NOTE — Telephone Encounter (Signed)
 Copied from CRM [id number]. Topic: General - Other >> Feb 20, 2024 10:24 AM Russell PARAS wrote: Reason for CRM:   Pt is requesting call back from [facility], she would not elaborate on issue she is contacting insurance about  CB#  [phone]

## 2024-02-20 NOTE — Telephone Encounter (Signed)
 Can we find a place to put her to see her for HFU as she is only requesting you and you have no availability

## 2024-02-21 NOTE — Telephone Encounter (Signed)
 RB has opening 7/18.  Patient has been scheduled 7/18 and is aware. Nothing further needed.

## 2024-02-21 NOTE — Telephone Encounter (Signed)
 If you can find a 15 minute slot to fit her in then I am ok with working her in

## 2024-02-28 ENCOUNTER — Telehealth: Payer: Self-pay | Admitting: Pulmonary Disease

## 2024-02-28 ENCOUNTER — Encounter: Payer: Self-pay | Admitting: Emergency Medicine

## 2024-02-28 ENCOUNTER — Ambulatory Visit (INDEPENDENT_AMBULATORY_CARE_PROVIDER_SITE_OTHER): Admitting: Emergency Medicine

## 2024-02-28 VITALS — BP 147/67 | HR 53 | Ht 62.0 in | Wt 141.8 lb

## 2024-02-28 DIAGNOSIS — C3431 Malignant neoplasm of lower lobe, right bronchus or lung: Secondary | ICD-10-CM

## 2024-02-28 DIAGNOSIS — J9611 Chronic respiratory failure with hypoxia: Secondary | ICD-10-CM | POA: Diagnosis not present

## 2024-02-28 DIAGNOSIS — K219 Gastro-esophageal reflux disease without esophagitis: Secondary | ICD-10-CM | POA: Diagnosis not present

## 2024-02-28 DIAGNOSIS — K922 Gastrointestinal hemorrhage, unspecified: Secondary | ICD-10-CM

## 2024-02-28 DIAGNOSIS — J301 Allergic rhinitis due to pollen: Secondary | ICD-10-CM

## 2024-02-28 DIAGNOSIS — Z87891 Personal history of nicotine dependence: Secondary | ICD-10-CM

## 2024-02-28 DIAGNOSIS — J449 Chronic obstructive pulmonary disease, unspecified: Secondary | ICD-10-CM | POA: Diagnosis not present

## 2024-02-28 DIAGNOSIS — J309 Allergic rhinitis, unspecified: Secondary | ICD-10-CM | POA: Insufficient documentation

## 2024-02-28 NOTE — Telephone Encounter (Signed)
 Spoke with patient verbalized understanding  orders have already been placed after visit in office this morning.   NFN-

## 2024-02-28 NOTE — Patient Instructions (Addendum)
 We reviewed your overnight oximetry test today.  This shows that your oxygen  level remains in the appropriate range for almost all night while sleeping.  You can try sleeping without your oxygen .  If you feel that you were getting more benefit with oxygen  in place then go back to using 2 L/min continuous flow while sleeping. You need to continue your oxygen  at 3 L/min whenever you exert yourself. Please continue your Trelegy as you have been taking it.  Rinse and gargle after use. Keep your albuterol  available use either 2 puffs or 1 nebulizer treatment up to every 4 hours if needed Trelegy breath, chest tightness, wheezing. Continue your Zyrtec  and your Dexilant  as you have been taking them Get your repeat CT scan of your chest and follow-up with Dr. Sherrod as planned Follow with Dr. Tashaya Ancrum or K. Cobb in 6 months, sooner if you have any problems.

## 2024-02-28 NOTE — Progress Notes (Signed)
 Subjective:    Patient ID: [name], female    DOB: 06-06-1941, 83 y.o.   MRN: [id number]  HPI  Hospital follow-up visit 02/28/2024 --[name] is 83 with a history of COPD, no longer smoking.  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  Medical history also significant for CAD, hypertension, chronic respiratory failure for which she had been on 3 L/min via POC with exertion.  Some question of whether she needs nocturnal oxygen  because her overnight oximetry had been done on oxygen  not room air.  She was hospitalized in early July for GI bleeding.  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  She had a bout of wheezing following her enteroscopy and was discharged on a steroid taper.  Currently managed on Trelegy, albuterol  as needed, Zyrtec  and Dexilant . She is feeling better, back to her baseline dyspnea. Minimal cough. No further bleeding seen  Her ONO showed only 20 minutes below 88% on RA.     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   Diverticulitis    Dyspnea    with exertion   Gastroesophageal reflux disease    occ   Glaucoma    right eye   Headache syndrome 11/27/2018   Hiatal hernia    History of shingles 06/23/2018   Hyperlipidemia    Hypertension    Lung cancer (HCC) dx 2018   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   Myocardial infarction (HCC) 01/01/2000   Cardiac catheterization   Peripheral vascular disease (HCC)    stents in legs x 2 or 3   Pneumonia    last time winter 2017  -2018   PONV (postoperative nausea and vomiting)    occassionally, last colonscopy did ok with anesthesia   Primary cancer of right lower lobe of lung (HCC) 04/25/2016   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   TIA (transient ischemic attack) 02/11/2019   Wears dentures    Full set   Wears glasses      Family History  Problem Relation Age of Onset   Heart disease Mother        Heart Disease before age 1   Hypertension Mother    Hyperlipidemia Mother    Heart attack Mother    Clotting disorder Mother    Heart disease Father        Heart Disease before age 23   Heart attack Father    Hyperlipidemia Father    Hypertension Father    Heart disease Brother        Heart Disease before age 67   Hyperlipidemia Brother    Hypertension Brother    Clotting disorder Brother    AAA (abdominal aortic aneurysm) Brother    Cerebral aneurysm Sister    Hypertension Sister    AAA (abdominal aortic aneurysm) Sister    Asthma Sister    Cerebral aneurysm Brother    Cancer Brother        Lung  Hypertension Brother    Heart attack Brother    Heart disease Brother        Aneurysm of Brain   Hypertension Brother    Heart disease Brother    Heart disease Brother    Stroke Son        Aneurysm of Stomach   AAA (abdominal aortic aneurysm) Son    Cancer Maternal Uncle        great uncle/cancer/type unknown     Social History   Socioeconomic History   Marital status: Widowed    Spouse name: Not on file   Number of children: 4   Years of education: 7TH   Highest education level: Not on file  Occupational History   Not on file  Tobacco Use   Smoking status: Former    Current packs/day: 0.00    Average packs/day: 1.5 packs/day for 30.0 years (45.0 ttl pk-yrs)    Types: Cigarettes    Start date: 08/13/1970    Quit date: 08/13/2000    Years since quitting: 23.5    Passive exposure: Never   Smokeless tobacco: Never  Vaping Use   Vaping status: Never Used  Substance and  Sexual Activity   Alcohol use: No    Alcohol/week: 0.0 standard drinks of alcohol   Drug use: No   Sexual activity: Never  Other Topics Concern   Not on file  Social History Narrative   Lives at home w/ her son   Right-handed   Drinks coffee and [employer]eps[employer] daily   Social Drivers of Health   Financial Resource Strain: Low Risk  (02/24/2024)   Received from [facility]   Overall Financial Resource Strain (CARDIA)    How hard is it for you to pay for the very basics like food, housing, medical care, and heating?: Not hard at all  Food Insecurity: No Food Insecurity (02/24/2024)   Received from [facility]   Hunger Vital Sign    Within the past 12 months, you worried that your food would run out before you got the money to buy more.: Never true    Within the past 12 months, the food you bought just didn't last and you didn't have money to get more.: Never true  Transportation Needs: No Transportation Needs (02/24/2024)   Received from [facility] - Transportation    In the past 12 months, has lack of transportation kept you from medical appointments or from getting medications?: No    In the past 12 months, has lack of transportation kept you from meetings, work, or from getting things needed for daily living?: No  Physical Activity: Inactive (02/24/2024)   Received from [facility]   Exercise Vital Sign    On average, how many days per week do you engage in moderate to strenuous exercise (like a brisk walk)?: 0 days    Minutes of Exercise per Session: Not on file  Stress: No Stress Concern Present (02/24/2024)   Received from [facility] - Occupational Stress Questionnaire    Do you feel stress - tense, restless, nervous, or anxious, or unable to sleep at night because your mind is troubled all the time - these days?: Only a little  Social Connections: Socially Integrated (02/24/2024)   Received from [facility]   Social  Network    How would you rate your social network (family, work, friends)?: Good participation with social networks  Intimate Partner Violence: Not At Risk (  02/24/2024)   Received from [facility]   HITS    Over the last 12 months how often did your partner physically hurt you?: Never    Over the last 12 months how often did your partner insult you or talk down to you?: Never    Over the last 12 months how often did your partner threaten you with physical harm?: Never    Over the last 12 months how often did your partner scream or curse at you?: Never     Allergies  Allergen Reactions   Avelox [Moxifloxacin Hcl In Nacl] Palpitations and Other (See Comments)    Caused Heart Attack    Azithromycin  Swelling and Other (See Comments)    Patient reported past history of lip swelling   Codeine Other (See Comments)    Dr. Levern advised patient not to take this medication   Doxycycline  Swelling and Other (See Comments)    Mouth, lips, feet swelling   Hydromorphone  Palpitations and Other (See Comments)    DILAUDID   -  Pt had a Heart Attack after taking Dilaudid .   Levaquin  [Levofloxacin ] Shortness Of Breath and Other (See Comments)    Chest pressure, SOB, pain in between shoulder blades, sweaty -as reported by patient per experience in ED this afternoon   Vitamin D Analogs Swelling and Other (See Comments)    Face and lips swell, but no breathing issues   Nifedipine Er Other (See Comments)    Dropped the heart rate too low   Octreotide      Chest pain, N/V   Zetia [Ezetimibe] Other (See Comments)    Made me feel like my face was going to sleep/numb   Oxycodone-Acetaminophen  Other (See Comments)    Says it makes her feel weird   Risedronate Other (See Comments)    Chest pain     Outpatient Medications Prior to Visit  Medication Sig Dispense Refill   acetaminophen  (TYLENOL ) 500 MG tablet Take 500-1,000 mg by mouth every 6 (six) hours as needed (for pain).     albuterol   (PROVENTIL ) (2.5 MG/3ML) 0.083% nebulizer solution Take 3 mLs (2.5 mg total) by nebulization 3 (three) times daily for 5 days, THEN 3 mLs (2.5 mg total) every 6 (six) hours as needed for wheezing or shortness of breath. 75 mL 0   albuterol  (VENTOLIN  HFA) 108 (90 Base) MCG/ACT inhaler Inhale 2 puffs into the lungs every 6 (six) hours as needed for wheezing or shortness of breath. 8 g 6   atorvastatin  (LIPITOR) 40 MG tablet Take 1 tablet (40 mg total) by mouth at bedtime. 30 tablet 6   cetirizine  (ZYRTEC ) 10 MG tablet Take 10 mg by mouth daily as needed for allergies.      clopidogrel  (PLAVIX ) 75 MG tablet Take 0.5 tablets (37.5 mg total) by mouth in the morning.     dexlansoprazole  (DEXILANT ) 60 MG capsule Take 60 mg by mouth daily before breakfast.     furosemide  (LASIX ) 40 MG tablet Take 20 mg by mouth in the morning.     nitroGLYCERIN  (NITRODUR - DOSED IN MG/24 HR) 0.2 mg/hr patch Place 0.2 mg onto the skin daily. Patient wears patch for 12 hours and removes     nitroGLYCERIN  (NITROSTAT ) 0.4 MG SL tablet Place 1 tablet (0.4 mg total) under the tongue every 5 (five) minutes x 3 doses as needed for chest pain. 10 tablet 0   olmesartan  (BENICAR ) 20 MG tablet Take 1 tablet (20 mg total) by mouth daily. 90 tablet 1  ondansetron  (ZOFRAN ) 4 MG tablet Take 1 tablet (4 mg total) by mouth every 6 (six) hours as needed for nausea. 20 tablet 0   TRELEGY ELLIPTA  200-62.5-25 MCG/ACT AEPB USE 1 INHALATION BY MOUTH DAILY 180 each 3   No facility-administered medications prior to visit.         Objective:   Physical Exam Vitals:   02/28/24 0904 02/28/24 0905  BP: (!) 152/53 (!) 147/67  Pulse: (!) 53   SpO2: 98%   Weight: 141 lb 12.8 oz (64.3 kg)   Height: 5' 2 (1.575 m)    Gen: Pleasant, elderly woman, in no distress,  normal affect  ENT: No lesions,  mouth clear,  oropharynx clear, no postnasal drip  Neck: No JVD, no stridor  Lungs: No use of accessory muscles, distant, few B end-exp  wheezes  Cardiovascular: RRR, heart sounds normal, no murmur or gallops, trace peripheral edema  Musculoskeletal: No deformities, no cyanosis or clubbing  Neuro: alert, awake, non focal  Skin: Warm, no lesions or rash      Assessment & Plan:   COPD Continue Trelegy, albuterol  as needed. Pulmonary hygiene. She required prednisone  during the recent hospitalization for GI bleeding but is now off of this.  We will keep her immunizations up-to-date, follow-up in 6 months  Primary cancer of right lower lobe of lung [facility]) Following with Dr. Sherrod.  Next imaging and visit in October.  Chronic respiratory failure with hypoxia (HCC) We reviewed your overnight oximetry test today.  This shows that your oxygen  level remains in the appropriate range for almost all night while sleeping.  You can try sleeping without your oxygen .  If you feel that you were getting more benefit with oxygen  in place then go back to using 2 L/min continuous flow while sleeping. You need to continue your oxygen  at 3 L/min whenever you exert yourself.  GERD (gastroesophageal reflux disease) Continue Dexilant   Acute lower GI bleeding Has follow-up planned with [name]  Allergic rhinitis Continue Zyrtec   Time spent 41 minutes  Lamar Chris, MD, PhD 02/28/2024, 9:39 AM  Pulmonary and Critical Care [phone] or if no answer before 7:00PM call [phone] For any issues after 7:00PM please call eLink [phone]

## 2024-02-28 NOTE — Assessment & Plan Note (Signed)
 Following with Dr. Sherrod.  Next imaging and visit in October.

## 2024-02-28 NOTE — Assessment & Plan Note (Signed)
 Continue Zyrtec.

## 2024-02-28 NOTE — Assessment & Plan Note (Signed)
 Has follow-up planned with [name]

## 2024-02-28 NOTE — Telephone Encounter (Signed)
 Repeat ONO 7/2  19 minutes 27 sec with SpO2 88% or less, she does qualify for night time oxygen . Recommend 2L of O2 with humidification. Please place DME order if patient ok with starting therapy.  Dorn Chill, MD [facility] Office: [phone]

## 2024-02-28 NOTE — Telephone Encounter (Signed)
 ONO Results  She did not spend anytime with an SpO2 less than 88%, she does not need oxygen  at night time.  Dorn Chill, MD [facility] [facility] Office: [phone]

## 2024-02-28 NOTE — Assessment & Plan Note (Signed)
 Continue Trelegy, albuterol  as needed. Pulmonary hygiene. She required prednisone  during the recent hospitalization for GI bleeding but is now off of this.  We will keep her immunizations up-to-date, follow-up in 6 months

## 2024-02-28 NOTE — Assessment & Plan Note (Signed)
 We reviewed your overnight oximetry test today.  This shows that your oxygen  level remains in the appropriate range for almost all night while sleeping.  You can try sleeping without your oxygen .  If you feel that you were getting more benefit with oxygen  in place then go back to using 2 L/min continuous flow while sleeping. You need to continue your oxygen  at 3 L/min whenever you exert yourself.

## 2024-02-28 NOTE — Assessment & Plan Note (Signed)
 Continue Dexilant

## 2024-03-03 ENCOUNTER — Ambulatory Visit: Payer: Self-pay | Admitting: Pulmonary Disease

## 2024-03-05 ENCOUNTER — Encounter: Payer: Self-pay | Admitting: Pulmonary Disease

## 2024-03-24 ENCOUNTER — Ambulatory Visit: Payer: Self-pay | Admitting: Emergency Medicine

## 2024-03-24 NOTE — Telephone Encounter (Signed)
 FYI Only or Action Required?: FYI only for provider.  Patient is followed in Pulmonology for COPD, last seen on 02/28/2024 by Natalie Lamar RAMAN, MD.  Called Nurse Triage reporting Breathing Problem and Cough.  Symptoms began yesterday.  Interventions attempted: Maintenance inhaler and Home oxygen  use.  Symptoms are: unchanged.  Triage Disposition: See Physician Within 24 Hours  Patient/caregiver understands and will follow disposition?: Yes       Copied from CRM [id number]. Topic: Clinical - Red Word Triage >> Mar 24, 2024  9:48 AM Corean SAUNDERS wrote: Red Word that prompted transfer to Nurse Triage: Difficulty/ painful breathing. Reason for Disposition  [1] Longstanding difficulty breathing (e.g., CHF, COPD, emphysema) AND [2] WORSE than normal  Answer Assessment - Initial Assessment Questions E2C2 Pulmonary Triage - Initial Assessment Questions Chief Complaint (e.g., cough, sob, wheezing, fever, chills, sweat or additional symptoms) *Go to specific symptom protocol after initial questions. My lungs is starting to hurt - concerns for PNA or fluid on my lung Runny nose Productive white cough  How long have symptoms been present? 2 days  Have you tested for COVID or Flu? Note: If not, ask patient if a home test can be taken. If so, instruct patient to call back for positive results. No  MEDICINES:   Have you used any OTC meds to help with symptoms? No If yes, ask What medications? N/a  Have you used your inhalers/maintenance medication? Yes If yes, What medications? TRELEGY ELLIPTA  - 1 puff daily Albuterol  PRN - has not used Triager reviewed/reinforced albuterol  usage and SIG.    If inhaler, ask How many puffs and how often? Note: Review instructions on medication in the chart. See above  OXYGEN : Do you wear supplemental oxygen ? Yes If yes, How many liters are you supposed to use? 3L with exertion  Do you monitor your oxygen  levels? Yes If  yes, What is your reading (oxygen  level) today? 96-98 BPM 55  What is your usual oxygen  saturation reading?  (Note: Pulmonary O2 sats should be 90% or greater) N/a       1. RESPIRATORY STATUS: Describe your breathing? (e.g., wheezing, shortness of breath, unable to speak, severe coughing)      See above 2. ONSET: When did this breathing problem begin?      See above 3. PATTERN Does the difficult breathing come and go, or has it been constant since it started?      Comes and goes 4. SEVERITY: How bad is your breathing? (e.g., mild, moderate, severe)      Mild - with exertion/talking Triager does not appreciate audible SOB/wheezing during call. Pt is speaking in full sentences.  5. RECURRENT SYMPTOM: Have you had difficulty breathing before? If Yes, ask: When was the last time? and What happened that time?      Yes, endorses PNA hx - current sx feel the same 6. CARDIAC HISTORY: Do you have any history of heart disease? (e.g., heart attack, angina, bypass surgery, angioplasty)      CHF - denies swelling in feet/wt gain Hx of triple bypass 7. LUNG HISTORY: Do you have any history of lung disease?  (e.g., pulmonary embolus, asthma, emphysema)     COPD 8. CAUSE: What do you think is causing the breathing problem?      Thinks PNA 9. OTHER SYMPTOMS: Do you have any other symptoms? (e.g., chest pain, cough, dizziness, fever, runny nose)     Runny nose, productive cough 10. O2 SATURATION MONITOR:  Do you use an oxygen   saturation monitor (pulse oximeter) at home? If Yes, ask: What is your reading (oxygen  level) today? What is your usual oxygen  saturation reading? (e.g., 95%)       See above 11. PREGNANCY: Is there any chance you are pregnant? When was your last menstrual period?       N/a 12. TRAVEL: Have you traveled out of the country in the last month? (e.g., travel history, exposures)       N/a    Pt looking for same-day appt, and will call PCP  for availability. Pt requested LBPU appt in case PCP unavailable. Pt will call back PRN.  Protocols used: Breathing Difficulty-A-AH

## 2024-03-24 NOTE — Telephone Encounter (Signed)
 FYI Only or Action Required?: FYI only for provider.  Patient is followed in Pulmonology for COPD, last seen on 02/28/2024 by Shelah Lamar RAMAN, MD.  Called Nurse Triage reporting Breathing Problem and Cough.  Symptoms began yesterday.  Interventions attempted: Maintenance inhaler and Home oxygen  use.  Symptoms are: unchanged.  Triage Disposition: See Physician Within 24 Hours  Patient/caregiver understands and will follow disposition?: Yes       Copied from CRM [id number]. Topic: Clinical - Red Word Triage >> Mar 24, 2024  9:48 AM [name] wrote: Red Word that prompted transfer to Nurse Triage: Difficulty/ painful breathing. Reason for Disposition  [1] Longstanding difficulty breathing (e.g., CHF, COPD, emphysema) AND [2] WORSE than normal  Answer Assessment - Initial Assessment Questions E2C2 Pulmonary Triage - Initial Assessment Questions Chief Complaint (e.g., cough, sob, wheezing, fever, chills, sweat or additional symptoms) *Go to specific symptom protocol after initial questions. My lungs is starting to hurt - concerns for PNA or fluid on my lung Runny nose Productive white cough  How long have symptoms been present? 2 days  Have you tested for COVID or Flu? Note: If not, ask patient if a home test can be taken. If so, instruct patient to call back for positive results. No  MEDICINES:   Have you used any OTC meds to help with symptoms? No If yes, ask What medications? N/a  Have you used your inhalers/maintenance medication? Yes If yes, What medications? TRELEGY ELLIPTA  - 1 puff daily Albuterol  PRN - has not used Triager reviewed/reinforced albuterol  usage and SIG.    If inhaler, ask How many puffs and how often? Note: Review instructions on medication in the chart. See above  OXYGEN : Do you wear supplemental oxygen ? Yes If yes, How many liters are you supposed to use? 3L with exertion  Do you monitor your oxygen  levels? Yes If  yes, What is your reading (oxygen  level) today? 96-98 BPM 55  What is your usual oxygen  saturation reading?  (Note: Pulmonary O2 sats should be 90% or greater) N/a       1. RESPIRATORY STATUS: Describe your breathing? (e.g., wheezing, shortness of breath, unable to speak, severe coughing)      See above 2. ONSET: When did this breathing problem begin?      See above 3. PATTERN Does the difficult breathing come and go, or has it been constant since it started?      Comes and goes 4. SEVERITY: How bad is your breathing? (e.g., mild, moderate, severe)      Mild - with exertion/talking Triager does not appreciate audible SOB/wheezing during call. Pt is speaking in full sentences.  5. RECURRENT SYMPTOM: Have you had difficulty breathing before? If Yes, ask: When was the last time? and What happened that time?      Yes, endorses PNA hx - current sx feel the same 6. CARDIAC HISTORY: Do you have any history of heart disease? (e.g., heart attack, angina, bypass surgery, angioplasty)      CHF - denies swelling in feet/wt gain Hx of triple bypass 7. LUNG HISTORY: Do you have any history of lung disease?  (e.g., pulmonary embolus, asthma, emphysema)     COPD 8. CAUSE: What do you think is causing the breathing problem?      Thinks PNA 9. OTHER SYMPTOMS: Do you have any other symptoms? (e.g., chest pain, cough, dizziness, fever, runny nose)     Runny nose, productive cough 10. O2 SATURATION MONITOR:  Do you use an oxygen   saturation monitor (pulse oximeter) at home? If Yes, ask: What is your reading (oxygen  level) today? What is your usual oxygen  saturation reading? (e.g., 95%)       See above 11. PREGNANCY: Is there any chance you are pregnant? When was your last menstrual period?       N/a 12. TRAVEL: Have you traveled out of the country in the last month? (e.g., travel history, exposures)       N/a    Pt looking for same-day appt, and will call PCP  for availability. Pt requested LBPU appt in case PCP unavailable. Pt will call back PRN.  Protocols used: Breathing Difficulty-A-AH

## 2024-03-25 ENCOUNTER — Encounter: Payer: Self-pay | Admitting: Emergency Medicine

## 2024-03-25 ENCOUNTER — Ambulatory Visit (INDEPENDENT_AMBULATORY_CARE_PROVIDER_SITE_OTHER): Admitting: Emergency Medicine

## 2024-03-25 VITALS — BP 138/64 | HR 64 | Temp 97.5°F | Wt 138.0 lb

## 2024-03-25 DIAGNOSIS — J9611 Chronic respiratory failure with hypoxia: Secondary | ICD-10-CM | POA: Diagnosis not present

## 2024-03-25 DIAGNOSIS — J301 Allergic rhinitis due to pollen: Secondary | ICD-10-CM

## 2024-03-25 DIAGNOSIS — C3431 Malignant neoplasm of lower lobe, right bronchus or lung: Secondary | ICD-10-CM

## 2024-03-25 DIAGNOSIS — J449 Chronic obstructive pulmonary disease, unspecified: Secondary | ICD-10-CM

## 2024-03-25 DIAGNOSIS — K219 Gastro-esophageal reflux disease without esophagitis: Secondary | ICD-10-CM

## 2024-03-25 MED ORDER — PREDNISONE 10 MG PO TABS
ORAL_TABLET | ORAL | 0 refills | Status: DC
Start: 2024-03-25 — End: 2024-05-18

## 2024-03-25 NOTE — Assessment & Plan Note (Signed)
 She did have 19 minutes of desaturation on room air on her overnight oximetry.  We decided to get her back on 2 L/min while sleeping.  She continues to liters per minute at rest and increased to 3 L/min via her POC when she exerts herself.

## 2024-03-25 NOTE — Progress Notes (Signed)
 Subjective:    Patient ID: [name], female    DOB: 07-07-1941, 83 y.o.   MRN: [id number]  Shortness of Breath   Hospital follow-up visit 02/28/2024 --[name] is 11 with a history of COPD, no longer smoking.  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  Medical history also significant for CAD, hypertension, chronic respiratory failure for which she had been on 3 L/min via POC with exertion.  Some question of whether she needs nocturnal oxygen  because her overnight oximetry had been done on oxygen  not room air.  She was hospitalized in early July for GI bleeding.  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  She had a bout of wheezing following her enteroscopy and was discharged on a steroid taper.  Currently managed on Trelegy, albuterol  as needed, Zyrtec  and Dexilant . She is feeling better, back to her baseline dyspnea. Minimal cough. No further bleeding seen  Her ONO showed only 20 minutes below 88% on RA.   Acute office visit 03/25/2024 --  [name] is 83 with a history of COPD, right lower lobe non-small cell lung cancer that was treated with SBRT, chronic hypoxemic respiratory failure on 3 L/min, overnight oximetry did confirm 20 minutes of desaturation, overnight oxygen  need.  PMH also significant for CAD, hypertension, hospitalization in July for GI bleeding, GERD. She is here today reporting that she developed URI sx a week ago, spoke with her PCP and was given pred x 5 days but hasn't started it yet. She has nasal congestion and obstruction right now. A CXR was done [facility] - she has the report and it was reassuring. She is coughing up white mucous. Some wheeze. She is using albuterol  frequently  thru the day. Her last flare was over a year ago.  Currently managed on Trelegy, Dexilant , Zyrtec , uses albuterol  approximately    Review of Systems  Respiratory:  Positive for shortness of breath.    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   Diverticulitis    Dyspnea    with exertion   Gastroesophageal reflux disease    occ   Glaucoma    right eye   Headache syndrome 11/27/2018   Hiatal hernia    History of shingles 06/23/2018   Hyperlipidemia    Hypertension    Lung cancer (HCC) dx 2018   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   Myocardial infarction (HCC) 01/01/2000   Cardiac catheterization   Peripheral vascular disease (HCC)    stents in legs x 2 or 3   Pneumonia    last time winter 2017 -2018   PONV (postoperative nausea and vomiting)    occassionally, last colonscopy did ok with anesthesia   Primary cancer of right lower lobe of lung (HCC) 04/25/2016   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   TIA (transient ischemic attack) 02/11/2019   Wears dentures    Full set   Wears glasses      Family History  Problem  Relation Age of Onset   Heart disease Mother        Heart Disease before age 56   Hypertension Mother    Hyperlipidemia Mother    Heart attack Mother    Clotting disorder Mother    Heart disease Father        Heart Disease before age 59   Heart attack Father    Hyperlipidemia Father    Hypertension Father    Heart disease Brother        Heart Disease before age 29   Hyperlipidemia Brother    Hypertension Brother    Clotting disorder Brother    AAA (abdominal aortic aneurysm) Brother    Cerebral aneurysm Sister    Hypertension Sister    AAA (abdominal aortic aneurysm) Sister    Asthma Sister    Cerebral aneurysm Brother    Cancer Brother        Lung    Hypertension Brother    Heart attack Brother    Heart disease Brother        Aneurysm of Brain   Hypertension Brother    Heart disease Brother    Heart disease Brother    Stroke Son        Aneurysm of Stomach   AAA (abdominal aortic aneurysm) Son    Cancer Maternal Uncle        great uncle/cancer/type unknown     Social History   Socioeconomic History   Marital status: Widowed    Spouse name: Not on file   Number of children: 4   Years of education: 7TH   Highest education level: Not on file  Occupational History   Not on file  Tobacco Use   Smoking status: Former    Current packs/day: 0.00    Average packs/day: 1.5 packs/day for 30.0 years (45.0 ttl pk-yrs)    Types: Cigarettes    Start date: 08/13/1970    Quit date: 08/13/2000    Years since quitting: 23.6    Passive exposure: Never   Smokeless tobacco: Never  Vaping Use   Vaping status: Never Used  Substance and Sexual Activity   Alcohol use: No    Alcohol/week: 0.0 standard drinks of alcohol   Drug use: No   Sexual activity: Never  Other Topics Concern   Not on file  Social History Narrative   Lives at home w/ her son   Right-handed   Drinks coffee and [employer]eps[employer] daily   Social Drivers of Health   Financial Resource Strain: Low Risk  (02/24/2024)   Received from [facility]   Overall Financial Resource Strain (CARDIA)    How hard is it for you to pay for the very basics like food, housing, medical care, and heating?: Not hard at all  Food Insecurity: No Food Insecurity (02/24/2024)   Received from [facility]   Hunger Vital Sign    Within the past 12 months, you worried that your food would run out before you got the money to buy more.: Never true    Within the past 12 months, the food you bought just didn't last and you didn't have money to get more.: Never true  Transportation Needs: No Transportation Needs (02/24/2024)   Received from [facility] - Transportation    In the past 12 months, has  lack of transportation kept you from medical appointments or from getting medications?: No    In the past 12 months, has lack of transportation kept you  from meetings, work, or from getting things needed for daily living?: No  Physical Activity: Inactive (02/24/2024)   Received from [facility]   Exercise Vital Sign    On average, how many days per week do you engage in moderate to strenuous exercise (like a brisk walk)?: 0 days    Minutes of Exercise per Session: Not on file  Stress: No Stress Concern Present (02/24/2024)   Received from [facility] - Occupational Stress Questionnaire    Do you feel stress - tense, restless, nervous, or anxious, or unable to sleep at night because your mind is troubled all the time - these days?: Only a little  Social Connections: Socially Integrated (02/24/2024)   Received from [facility]   Social Network    How would you rate your social network (family, work, friends)?: Good participation with social networks  Intimate Partner Violence: Not At Risk (02/24/2024)   Received from [facility]   HITS    Over the last 12 months how often did your partner physically hurt you?: Never    Over the last 12 months how often did your partner insult you or talk down to you?: Never    Over the last 12 months how often did your partner threaten you with physical harm?: Never    Over the last 12 months how often did your partner scream or curse at you?: Never     Allergies  Allergen Reactions   Avelox [Moxifloxacin Hcl In Nacl] Palpitations and Other (See Comments)    Caused Heart Attack    Azithromycin  Swelling and Other (See Comments)    Patient reported past history of lip swelling   Codeine Other (See Comments)    Dr. Levern advised patient not to take this medication   Doxycycline  Swelling and Other (See Comments)    Mouth, lips, feet swelling   Hydromorphone  Palpitations and Other (See Comments)    DILAUDID    -  Pt had a Heart Attack after taking Dilaudid .   Levaquin  [Levofloxacin ] Shortness Of Breath and Other (See Comments)    Chest pressure, SOB, pain in between shoulder blades, sweaty -as reported by patient per experience in ED this afternoon   Vitamin D Analogs Swelling and Other (See Comments)    Face and lips swell, but no breathing issues   Nifedipine Er Other (See Comments)    Dropped the heart rate too low   Octreotide      Chest pain, N/V   Zetia [Ezetimibe] Other (See Comments)    Made me feel like my face was going to sleep/numb   Oxycodone-Acetaminophen  Other (See Comments)    Says it makes her feel weird   Risedronate Other (See Comments)    Chest pain     Outpatient Medications Prior to Visit  Medication Sig Dispense Refill   acetaminophen  (TYLENOL ) 500 MG tablet Take 500-1,000 mg by mouth every 6 (six) hours as needed (for pain).     albuterol  (PROVENTIL ) (2.5 MG/3ML) 0.083% nebulizer solution Take 3 mLs (2.5 mg total) by nebulization 3 (three) times daily for 5 days, THEN 3 mLs (2.5 mg total) every 6 (six) hours as needed for wheezing or shortness of breath. 75 mL 0   albuterol  (VENTOLIN  HFA) 108 (90 Base) MCG/ACT inhaler Inhale 2 puffs into the lungs every 6 (six) hours as needed for wheezing or shortness of breath. 8 g 6   atorvastatin  (LIPITOR) 40 MG tablet Take 1 tablet (40 mg total)  by mouth at bedtime. 30 tablet 6   cetirizine  (ZYRTEC ) 10 MG tablet Take 10 mg by mouth daily as needed for allergies.      clopidogrel  (PLAVIX ) 75 MG tablet Take 0.5 tablets (37.5 mg total) by mouth in the morning.     dexlansoprazole  (DEXILANT ) 60 MG capsule Take 60 mg by mouth daily before breakfast.     furosemide  (LASIX ) 40 MG tablet Take 20 mg by mouth in the morning.     nitroGLYCERIN  (NITRODUR - DOSED IN MG/24 HR) 0.2 mg/hr patch Place 0.2 mg onto the skin daily. Patient wears patch for 12 hours and removes     nitroGLYCERIN  (NITROSTAT ) 0.4 MG SL tablet Place 1 tablet (0.4 mg  total) under the tongue every 5 (five) minutes x 3 doses as needed for chest pain. 10 tablet 0   olmesartan  (BENICAR ) 20 MG tablet Take 1 tablet (20 mg total) by mouth daily. 90 tablet 1   ondansetron  (ZOFRAN ) 4 MG tablet Take 1 tablet (4 mg total) by mouth every 6 (six) hours as needed for nausea. 20 tablet 0   predniSONE  (DELTASONE ) 20 MG tablet Take 20 mg by mouth 2 (two) times daily.     TRELEGY ELLIPTA  200-62.5-25 MCG/ACT AEPB USE 1 INHALATION BY MOUTH DAILY 180 each 3   methylPREDNISolone  (MEDROL ) 4 MG tablet Take 20 mg by mouth in the morning and at bedtime.     No facility-administered medications prior to visit.         Objective:   Physical Exam Vitals:   03/25/24 1057  BP: 138/64  Pulse: 64  Temp: (!) 97.5 F (36.4 C)  SpO2: 100%  Weight: 138 lb (62.6 kg)   Gen: Pleasant, elderly woman, in no distress,  normal affect  ENT: No lesions,  mouth clear,  oropharynx clear, no postnasal drip  Neck: No JVD, no stridor  Lungs: No use of accessory muscles, distant, bilateral end expiratory wheezing  Cardiovascular: RRR, heart sounds normal, no murmur or gallops, trace peripheral edema  Musculoskeletal: No deformities, no cyanosis or clubbing  Neuro: alert, awake, non focal  Skin: Warm, no lesions or rash      Assessment & Plan:   COPD Acute exacerbation in the setting of an apparent URI.  Still with nasal congestion.  She is taking her Zyrtec .  Will plan to extend her prednisone  to a full taper.  She was given 40 mg for 5 days.  Hold off on antibiotics as she is not having purulent sputum.  Continue her maintenance regimen.  I will have her follow-up in 1 month to ensure that she is improving  Primary cancer of right lower lobe of lung [facility]) Imaging and follow-up with Dr. Sherrod planned for October  Chronic respiratory failure with hypoxia [facility]) She did have 19 minutes of desaturation on room air on her overnight oximetry.  We decided to get her back on 2 L/min  while sleeping.  She continues to liters per minute at rest and increased to 3 L/min via her POC when she exerts herself.  Allergic rhinitis Continue Zyrtec   GERD (gastroesophageal reflux disease) Continue her PPI as ordered    Lamar Chris, MD, PhD 03/25/2024, 11:40 AM [facility] [phone] or if no answer before 7:00PM call [phone] For any issues after 7:00PM please call eLink [phone]

## 2024-03-25 NOTE — Assessment & Plan Note (Signed)
 Continue Zyrtec.

## 2024-03-25 NOTE — Assessment & Plan Note (Signed)
 Imaging and follow-up with Dr. Sherrod planned for October

## 2024-03-25 NOTE — Assessment & Plan Note (Signed)
 Acute exacerbation in the setting of an apparent URI.  Still with nasal congestion.  She is taking her Zyrtec .  Will plan to extend her prednisone  to a full taper.  She was given 40 mg for 5 days.  Hold off on antibiotics as she is not having purulent sputum.  Continue her maintenance regimen.  I will have her follow-up in 1 month to ensure that she is improving

## 2024-03-25 NOTE — Patient Instructions (Addendum)
 Please take prednisone  as directed until completely gone.  We will extend the planned course so hold off on starting the 5-day course that you were already given. We reviewed your chest x-ray today. Continue Trelegy once daily.  Rinse and gargle after using. Keep albuterol  available to use 2 puffs or 1 nebulizer treatment up to every 4 hours if needed for shortness of breath, chest tightness, wheezing.  Continue your Zyrtec  as you have been using it Continue your oxygen  at 3 L/min pulsed flow on your portable oxygen  concentrator when you exert yourself Use oxygen  at 2 L/min at rest and while sleeping on your regular concentrator. Follow-up in our office in about 1 month so we can ensure that you are feeling better.

## 2024-03-25 NOTE — Assessment & Plan Note (Signed)
 Continue her PPI as ordered

## 2024-04-08 ENCOUNTER — Telehealth: Payer: Self-pay | Admitting: Medical Oncology

## 2024-04-08 NOTE — Telephone Encounter (Signed)
 Concerned about son, [name]'s recent CT scan results ordered by PCP. Her dtr , Leita, is going to  Dr Lanny office to see if Charlie can get an apt with Dr Shelah. I told her that is appropriate and his PCP may need to refer him.

## 2024-04-11 ENCOUNTER — Other Ambulatory Visit: Payer: Self-pay

## 2024-04-11 ENCOUNTER — Emergency Department (HOSPITAL_COMMUNITY)

## 2024-04-11 ENCOUNTER — Encounter (HOSPITAL_COMMUNITY): Payer: Self-pay

## 2024-04-11 ENCOUNTER — Emergency Department (HOSPITAL_COMMUNITY)
Admission: EM | Admit: 2024-04-11 | Discharge: 2024-04-11 | Disposition: A | Discharge status: Home / Self Care | Attending: Emergency Medicine | Admitting: Emergency Medicine

## 2024-04-11 DIAGNOSIS — R197 Diarrhea, unspecified: Secondary | ICD-10-CM | POA: Insufficient documentation

## 2024-04-11 DIAGNOSIS — R110 Nausea: Secondary | ICD-10-CM | POA: Insufficient documentation

## 2024-04-11 DIAGNOSIS — Z85118 Personal history of other malignant neoplasm of bronchus and lung: Secondary | ICD-10-CM | POA: Diagnosis not present

## 2024-04-11 DIAGNOSIS — N1832 Chronic kidney disease, stage 3b: Secondary | ICD-10-CM | POA: Diagnosis not present

## 2024-04-11 DIAGNOSIS — R109 Unspecified abdominal pain: Secondary | ICD-10-CM

## 2024-04-11 DIAGNOSIS — Z7902 Long term (current) use of antithrombotics/antiplatelets: Secondary | ICD-10-CM | POA: Diagnosis not present

## 2024-04-11 DIAGNOSIS — R1011 Right upper quadrant pain: Secondary | ICD-10-CM | POA: Diagnosis not present

## 2024-04-11 LAB — CBC WITH DIFFERENTIAL/PLATELET
Abs Immature Granulocytes: 0.03 K/uL (ref 0.00–0.07)
Basophils Absolute: 0 K/uL (ref 0.0–0.1)
Basophils Relative: 0 %
Eosinophils Absolute: 0.3 K/uL (ref 0.0–0.5)
Eosinophils Relative: 3 %
HCT: 34.1 % — ABNORMAL LOW (ref 36.0–46.0)
Hemoglobin: 10.4 g/dL — ABNORMAL LOW (ref 12.0–15.0)
Immature Granulocytes: 0 %
Lymphocytes Relative: 12 %
Lymphs Abs: 1.1 K/uL (ref 0.7–4.0)
MCH: 28.5 pg (ref 26.0–34.0)
MCHC: 30.5 g/dL (ref 30.0–36.0)
MCV: 93.4 fL (ref 80.0–100.0)
Monocytes Absolute: 0.8 K/uL (ref 0.1–1.0)
Monocytes Relative: 8 %
Neutro Abs: 7.3 K/uL (ref 1.7–7.7)
Neutrophils Relative %: 77 %
Platelets: 282 K/uL (ref 150–400)
RBC: 3.65 MIL/uL — ABNORMAL LOW (ref 3.87–5.11)
RDW: 15 % (ref 11.5–15.5)
WBC: 9.5 K/uL (ref 4.0–10.5)
nRBC: 0 % (ref 0.0–0.2)

## 2024-04-11 LAB — URINALYSIS, ROUTINE W REFLEX MICROSCOPIC
Bilirubin Urine: NEGATIVE
Glucose, UA: NEGATIVE mg/dL
Hgb urine dipstick: NEGATIVE
Ketones, ur: NEGATIVE mg/dL
Nitrite: NEGATIVE
Protein, ur: NEGATIVE mg/dL
Specific Gravity, Urine: 1.005 (ref 1.005–1.030)
pH: 6 (ref 5.0–8.0)

## 2024-04-11 LAB — BASIC METABOLIC PANEL WITH GFR
Anion gap: 13 (ref 5–15)
BUN: 21 mg/dL (ref 8–23)
CO2: 25 mmol/L (ref 22–32)
Calcium: 9.8 mg/dL (ref 8.9–10.3)
Chloride: 101 mmol/L (ref 98–111)
Creatinine, Ser: 1.92 mg/dL — ABNORMAL HIGH (ref 0.44–1.00)
GFR, Estimated: 26 mL/min — ABNORMAL LOW (ref >60)
Glucose, Bld: 110 mg/dL — ABNORMAL HIGH (ref 70–99)
Potassium: 4.5 mmol/L (ref 3.5–5.1)
Sodium: 139 mmol/L (ref 135–145)

## 2024-04-11 LAB — TROPONIN T, HIGH SENSITIVITY
Troponin T High Sensitivity: 29 ng/L — ABNORMAL HIGH (ref 0–19)
Troponin T High Sensitivity: 30 ng/L — ABNORMAL HIGH (ref 0–19)

## 2024-04-11 MED ORDER — LACTATED RINGERS IV BOLUS
1000.0000 mL | Freq: Once | INTRAVENOUS | Status: AC
  Administered 2024-04-11: 1000 mL via INTRAVENOUS

## 2024-04-11 MED ORDER — MORPHINE SULFATE (PF) 2 MG/ML IV SOLN
2.0000 mg | Freq: Once | INTRAVENOUS | Status: DC
  Filled 2024-04-11: qty 1

## 2024-04-11 MED ORDER — ONDANSETRON HCL 4 MG/2ML IJ SOLN
4.0000 mg | Freq: Once | INTRAMUSCULAR | Status: AC
  Administered 2024-04-11: 4 mg via INTRAVENOUS
  Filled 2024-04-11: qty 2

## 2024-04-11 NOTE — ED Triage Notes (Signed)
 Patient reports had an episode of severe right flank pain with cold sweat and weakness.  Reports it happened yesterday and went away but today it has not.  Reports nausea no vomiting.  Reports no urinary symptoms.  Patient has hx of cancer and is on O2 at home at 3L [place]

## 2024-04-11 NOTE — Discharge Instructions (Addendum)
 It was our pleasure to provide your ER care today - we hope that you feel better.  Drink plenty of fluids/stay well hydrated. Take acetaminophen  as need.   For your lung cancer, follow up with your oncologist as planned.   Return to ER if worse, new symptoms, fevers, new/severe pain, new or worsening or severe abdominal pain, persistent vomiting, severe diarrhea, chest pain, trouble breathing, or other concern.

## 2024-04-11 NOTE — ED Provider Notes (Signed)
 [place] EMERGENCY DEPARTMENT AT [facility] Provider Note   CSN: [id number] Arrival date & time: 04/11/24  1151     Patient presents with: Flank Pain   [name] is a 83 y.o. female.   Pt  indicates had gotten up today, felt fine, then had nausea, and 2-3 episodes of diarrhea - diarrhea loose to watery, not bloody, no rectal bleeding or melena. Notes similar episode nausea yesterday. Pt  then also c/o pain to right upper flank/abd, dull, waxing/waning, non radiating, not pleuritic.  Remote hx cholecystectomy. No hematuria or dysuria. No hx kidney stones. Denies any chest pain or discomfort. No new or worsening sob or doe (on home o2, 3 liters, continuously, hx lung ca).  No known bad food ingestion or ill contacts. No change in meds.   The history is provided by the patient, medical records and a relative.  Flank Pain Pertinent negatives include no chest pain, no headaches and no shortness of breath.       Prior to Admission medications   Medication Sig Start Date End Date Taking? Authorizing Provider  acetaminophen  (TYLENOL ) 500 MG tablet Take 500-1,000 mg by mouth every 6 (six) hours as needed (for pain).    [provider]  albuterol  (PROVENTIL ) (2.5 MG/3ML) 0.083% nebulizer solution Take 3 mLs (2.5 mg total) by nebulization 3 (three) times daily for 5 days, THEN 3 mLs (2.5 mg total) every 6 (six) hours as needed for wheezing or shortness of breath. 02/18/24 02/22/25  Sebastian Toribio GAILS, MD  albuterol  (VENTOLIN  HFA) [phone] Base) MCG/ACT inhaler Inhale 2 puffs into the lungs every 6 (six) hours as needed for wheezing or shortness of breath. 05/25/20   Icard, Adine CROME, DO  atorvastatin  (LIPITOR) 40 MG tablet Take 1 tablet (40 mg total) by mouth at bedtime. 09/29/21   Rhyne, Samantha J, PA-C  cetirizine  (ZYRTEC ) 10 MG tablet Take 10 mg by mouth daily as needed for allergies.     [provider]  clopidogrel  (PLAVIX ) 75 MG tablet Take 0.5 tablets (37.5  mg total) by mouth in the morning. 05/27/23   Patsy Lenis, MD  dexlansoprazole  (DEXILANT ) 60 MG capsule Take 60 mg by mouth daily before breakfast.    [provider]  furosemide  (LASIX ) 40 MG tablet Take 20 mg by mouth in the morning. 01/30/21   [provider]  methylPREDNISolone  (MEDROL ) 4 MG tablet Take 20 mg by mouth in the morning and at bedtime. 01/29/24   [provider]  nitroGLYCERIN  (NITRODUR - DOSED IN MG/24 HR) 0.2 mg/hr patch Place 0.2 mg onto the skin daily. Patient wears patch for 12 hours and removes 09/06/23   [provider]  nitroGLYCERIN  (NITROSTAT ) 0.4 MG SL tablet Place 1 tablet (0.4 mg total) under the tongue every 5 (five) minutes x 3 doses as needed for chest pain. 08/01/22   Cobb, Comer GAILS, NP  olmesartan  (BENICAR ) 20 MG tablet Take 1 tablet (20 mg total) by mouth daily. 07/04/20   Gonfa, Taye T, MD  ondansetron  (ZOFRAN ) 4 MG tablet Take 1 tablet (4 mg total) by mouth every 6 (six) hours as needed for nausea. 02/18/24   Sebastian Toribio GAILS, MD  predniSONE  (DELTASONE ) 10 MG tablet Take 40mg  daily for 3 days, then 30mg  daily for 3 days, then 20mg  daily for 3 days, then 10mg  daily for 3 days, then stop 03/25/24   Byrum, Robert S, MD  predniSONE  (DELTASONE ) 20 MG tablet Take 20 mg by mouth 2 (two)  times daily. 03/24/24   [provider]  TRELEGY ELLIPTA  200-62.5-25 MCG/ACT AEPB USE 1 INHALATION BY MOUTH DAILY 02/26/23   Icard, Adine CROME, DO    Allergies: Avelox [moxifloxacin hcl in nacl], Azithromycin , Codeine, Doxycycline , Hydromorphone , Levaquin  [levofloxacin ], Vitamin d analogs, Nifedipine er, Octreotide , Zetia [ezetimibe], Oxycodone-acetaminophen , and Risedronate    Review of Systems  Constitutional:  Negative for chills and fever.  HENT:  Negative for sore throat.   Respiratory:  Negative for cough and shortness of breath.   Cardiovascular:  Negative for chest pain and leg swelling.  Gastrointestinal:  Positive for nausea.  Negative for blood in stool, constipation and vomiting.  Genitourinary:  Positive for flank pain. Negative for dysuria and hematuria.  Musculoskeletal:  Negative for back pain and neck pain.  Skin:  Negative for rash.  Neurological:  Negative for headaches.    Updated Vital Signs BP (!) 143/36 (BP Location: Right Arm)   Pulse (!) 58   Temp 97.8 F (36.6 C) (Oral)   Resp 16   Ht 1.575 m (5' 2)   Wt 62.6 kg   SpO2 100%   BMI 25.24 kg/m   Physical Exam Vitals and nursing note reviewed.  Constitutional:      Appearance: Normal appearance. She is well-developed.  HENT:     Head: Atraumatic.     Nose: Nose normal.     Mouth/Throat:     Mouth: Mucous membranes are moist.  Eyes:     General: No scleral icterus.    Conjunctiva/sclera: Conjunctivae normal.  Neck:     Trachea: No tracheal deviation.  Cardiovascular:     Rate and Rhythm: Normal rate and regular rhythm.     Pulses: Normal pulses.     Heart sounds: Normal heart sounds. No murmur heard.    No friction rub. No gallop.  Pulmonary:     Effort: Pulmonary effort is normal. No respiratory distress.     Breath sounds: Normal breath sounds.  Abdominal:     General: Bowel sounds are normal. There is no distension.     Palpations: Abdomen is soft. There is no mass.     Tenderness: There is no abdominal tenderness. There is no guarding or rebound.     Hernia: No hernia is present.  Genitourinary:    Comments: No cva tenderness.  Musculoskeletal:        General: No swelling.     Cervical back: Normal range of motion and neck supple. No rigidity. No muscular tenderness.  Skin:    General: Skin is warm and dry.     Findings: No rash.     Comments: No skin lesions or rash to area of pain.   Neurological:     Mental Status: She is alert.     Comments: Alert, speech normal.   Psychiatric:        Mood and Affect: Mood normal.     (all labs ordered are listed, but only abnormal results are displayed) Results for orders  placed or performed during the hospital encounter of 04/11/24  CBC with Differential   Collection Time: 04/11/24 12:36 PM  Result Value Ref Range   WBC 9.5 4.0 - 10.5 K/uL   RBC 3.65 (L) 3.87 - 5.11 MIL/uL   Hemoglobin 10.4 (L) 12.0 - 15.0 g/dL   HCT 65.8 (L) 63.9 - 53.9 %   MCV 93.4 80.0 - 100.0 fL   MCH 28.5 26.0 - 34.0 pg   MCHC 30.5 30.0 - 36.0 g/dL  RDW 15.0 11.5 - 15.5 %   Platelets 282 150 - 400 K/uL   nRBC 0.0 0.0 - 0.2 %   Neutrophils Relative % 77 %   Neutro Abs 7.3 1.7 - 7.7 K/uL   Lymphocytes Relative 12 %   Lymphs Abs 1.1 0.7 - 4.0 K/uL   Monocytes Relative 8 %   Monocytes Absolute 0.8 0.1 - 1.0 K/uL   Eosinophils Relative 3 %   Eosinophils Absolute 0.3 0.0 - 0.5 K/uL   Basophils Relative 0 %   Basophils Absolute 0.0 0.0 - 0.1 K/uL   Immature Granulocytes 0 %   Abs Immature Granulocytes 0.03 0.00 - 0.07 K/uL  Basic metabolic panel   Collection Time: 04/11/24 12:36 PM  Result Value Ref Range   Sodium 139 135 - 145 mmol/L   Potassium 4.5 3.5 - 5.1 mmol/L   Chloride 101 98 - 111 mmol/L   CO2 25 22 - 32 mmol/L   Glucose, Bld 110 (H) 70 - 99 mg/dL   BUN 21 8 - 23 mg/dL   Creatinine, Ser 8.07 (H) 0.44 - 1.00 mg/dL   Calcium  9.8 8.9 - 10.3 mg/dL   GFR, Estimated 26 (L) >60 mL/min   Anion gap 13 5 - 15  Troponin T, High Sensitivity   Collection Time: 04/11/24 12:36 PM  Result Value Ref Range   Troponin T High Sensitivity 29 (H) 0 - 19 ng/L   CT Renal Stone Study Result Date: 04/11/2024 EXAM: CT ABDOMEN AND PELVIS WITHOUT CONTRAST 04/11/2024 01:07:14 PM TECHNIQUE: CT of the abdomen and pelvis was performed without the administration of intravenous contrast. Multiplanar reformatted images are provided for review. Automated exposure control, iterative reconstruction, and/or weight-based adjustment of the mA/kV was utilized to reduce the radiation dose to as low as reasonably achievable. COMPARISON: 02/14/2024 CLINICAL HISTORY: Abdominal/flank pain, stone suspected;  right flank pain. Stone suspected FINDINGS: LOWER CHEST: Partially imaged nodular density within the right lower lobe measures 2.7 x 2.1 cm (image 1/6). This appears similar to PET/CT from 12/10/2023 and recent CT of the abdomen and pelvis from 02/14/2024. Indeterminate and possibly reflecting post treatment change versus underlying malignancy. See report from PET CT dated 12/10/2023. No acute abnormalities identified within the imaged portions of the lung bases. LIVER: Cyst within segment 3 of left lobe of liver measures 1.5 cm (image 11/2). No suspicious liver lesion. GALLBLADDER AND BILE DUCTS: Status post cholecystectomy. No bile duct dilatation. SPLEEN: Normal size. No focal lesion. PANCREAS: Normal appearance. ADRENAL GLANDS: Normal appearance. No mass. KIDNEYS, URETERS AND BLADDER: No stones in the kidneys or ureters. No hydronephrosis. No perinephric or periureteral stranding. Urinary bladder is decompressed. No bladder calculi identified. GI AND BOWEL: Status post appendectomy. Stomach demonstrates no acute abnormality. There is no bowel obstruction. No bowel wall thickening. PERITONEUM AND RETROPERITONEUM: No ascites. No free air. VASCULATURE: Aortic atherosclerosis. LYMPH NODES: No lymphadenopathy. REPRODUCTIVE ORGANS: Status post hysterectomy. No adnexal mass. BONES AND SOFT TISSUES: No acute osseous abnormality. No focal soft tissue abnormality. IMPRESSION: 1. No acute findings in the abdomen or pelvis related to the clinical history of abdominal/flank pain and suspected stone. 2. Indeterminate nodular density in the right lower lobe similar to previous imaging. Please refer to the report from PET/CT from 04/10/2024 for further details. Electronically signed by: Waddell Calk MD 04/11/2024 01:32 PM EDT RP Workstation: [id number]   DG Chest 2 View Result Date: 04/11/2024 EXAM: 2 VIEW(S) XRAY OF THE CHEST 04/11/2024 12:12:57 PM COMPARISON: 02/17/2024 CLINICAL HISTORY: Right mid back pain.  Per chart:  Patient reports had an episode of severe right flank pain with cold sweat and weakness. Reports it happened yesterday and went away but today it has not. Reports nausea no vomiting. Reports no urinary symptoms. Patient has hx of cancer and is on O2 at home at 3L [place]. FINDINGS: LUNGS AND PLEURA: No focal pulmonary opacity. No pulmonary edema. No pleural effusion. No pneumothorax. HEART AND MEDIASTINUM: No acute abnormality of the cardiac and mediastinal silhouettes. Status post CABG procedure. Aortic atherosclerotic calcification. BONES AND SOFT TISSUES: No acute osseous abnormality. Mild endplate degenerative changes noted throughout the thoracic spine. IMPRESSION: 1. No acute findings. Electronically signed by: Waddell Calk MD 04/11/2024 12:18 PM EDT RP Workstation: [id number]     EKG: EKG Interpretation Date/Time:  Saturday April 11 2024 11:59:35 EDT Ventricular Rate:  71 PR Interval:  121 QRS Duration:  82 QT Interval:  420 QTC Calculation: 457 R Axis:   70  Text Interpretation: Sinus rhythm Confirmed by Bernard Drivers ([id number]) on 04/11/2024 12:49:32 PM  Radiology: CT Renal Stone Study Result Date: 04/11/2024 EXAM: CT ABDOMEN AND PELVIS WITHOUT CONTRAST 04/11/2024 01:07:14 PM TECHNIQUE: CT of the abdomen and pelvis was performed without the administration of intravenous contrast. Multiplanar reformatted images are provided for review. Automated exposure control, iterative reconstruction, and/or weight-based adjustment of the mA/kV was utilized to reduce the radiation dose to as low as reasonably achievable. COMPARISON: 02/14/2024 CLINICAL HISTORY: Abdominal/flank pain, stone suspected; right flank pain. Stone suspected FINDINGS: LOWER CHEST: Partially imaged nodular density within the right lower lobe measures 2.7 x 2.1 cm (image 1/6). This appears similar to PET/CT from 12/10/2023 and recent CT of the abdomen and pelvis from 02/14/2024. Indeterminate and possibly reflecting post treatment change  versus underlying malignancy. See report from PET CT dated 12/10/2023. No acute abnormalities identified within the imaged portions of the lung bases. LIVER: Cyst within segment 3 of left lobe of liver measures 1.5 cm (image 11/2). No suspicious liver lesion. GALLBLADDER AND BILE DUCTS: Status post cholecystectomy. No bile duct dilatation. SPLEEN: Normal size. No focal lesion. PANCREAS: Normal appearance. ADRENAL GLANDS: Normal appearance. No mass. KIDNEYS, URETERS AND BLADDER: No stones in the kidneys or ureters. No hydronephrosis. No perinephric or periureteral stranding. Urinary bladder is decompressed. No bladder calculi identified. GI AND BOWEL: Status post appendectomy. Stomach demonstrates no acute abnormality. There is no bowel obstruction. No bowel wall thickening. PERITONEUM AND RETROPERITONEUM: No ascites. No free air. VASCULATURE: Aortic atherosclerosis. LYMPH NODES: No lymphadenopathy. REPRODUCTIVE ORGANS: Status post hysterectomy. No adnexal mass. BONES AND SOFT TISSUES: No acute osseous abnormality. No focal soft tissue abnormality. IMPRESSION: 1. No acute findings in the abdomen or pelvis related to the clinical history of abdominal/flank pain and suspected stone. 2. Indeterminate nodular density in the right lower lobe similar to previous imaging. Please refer to the report from PET/CT from 04/10/2024 for further details. Electronically signed by: Waddell Calk MD 04/11/2024 01:32 PM EDT RP Workstation: [id number]   DG Chest 2 View Result Date: 04/11/2024 EXAM: 2 VIEW(S) XRAY OF THE CHEST 04/11/2024 12:12:57 PM COMPARISON: 02/17/2024 CLINICAL HISTORY: Right mid back pain. Per chart: Patient reports had an episode of severe right flank pain with cold sweat and weakness. Reports it happened yesterday and went away but today it has not. Reports nausea no vomiting. Reports no urinary symptoms. Patient has hx of cancer and is on O2 at home at 3L [place]. FINDINGS: LUNGS AND PLEURA: No focal pulmonary  opacity. No pulmonary edema. No pleural effusion. No pneumothorax.  HEART AND MEDIASTINUM: No acute abnormality of the cardiac and mediastinal silhouettes. Status post CABG procedure. Aortic atherosclerotic calcification. BONES AND SOFT TISSUES: No acute osseous abnormality. Mild endplate degenerative changes noted throughout the thoracic spine. IMPRESSION: 1. No acute findings. Electronically signed by: Waddell Calk MD 04/11/2024 12:18 PM EDT RP Workstation: [id number]     Procedures   Medications Ordered in the ED  lactated ringers  bolus 1,000 mL (1,000 mLs Intravenous New Bag/Given 04/11/24 1335)  ondansetron  (ZOFRAN ) injection 4 mg (4 mg Intravenous Given 04/11/24 1333)                                    Medical Decision Making Problems Addressed: Diarrhea, unspecified type: acute illness or injury with systemic symptoms History of lung cancer: chronic illness or injury with exacerbation, progression, or side effects of treatment that poses a threat to life or bodily functions Nausea: acute illness or injury with systemic symptoms Right flank pain: acute illness or injury with systemic symptoms that poses a threat to life or bodily functions Stage 3b chronic kidney disease (HCC): acute illness or injury with systemic symptoms that poses a threat to life or bodily functions  Amount and/or Complexity of Data Reviewed Independent Historian:     Details: Fam, hx External Data Reviewed: notes. Labs: ordered. Decision-making details documented in ED Course. Radiology: ordered and independent interpretation performed. Decision-making details documented in ED Course. ECG/medicine tests: ordered and independent interpretation performed. Decision-making details documented in ED Course.  Risk Prescription drug management. Parenteral controlled substances. Decision regarding hospitalization.   Iv ns. Continuous pulse ox and cardiac monitoring. Labs ordered/sent. Imaging ordered.    Differential diagnosis includes nausea/vomiting/diarrhea illness, ureteral stone, etc. Dispo decision including potential need for admission considered - will get labs and imaging and reassess.   Reviewed nursing notes and prior charts for additional history. External reports reviewed. Additional history from: family.   Cardiac monitor: sinus rhythm, rate 70.  Morphine  2 mg iv, zofran  iv, LR bolus.   Labs reviewed/interpreted by me - wbc normal. Hct  34.  Trop was sent from triage, unclear why as no chest pain/discomfort or sob - is mildly elev, delta is pending. UA pending.   Xrays reviewed/interpreted by me - no pna.   CT reviewed/interpreted by me - no acute process.   Recheck pt comfortable, no pain or distress. Abd soft nt. No recurrent nausea or diarrhea.   1527, UA and delta trop pending - signed out to oncoming EDP to check pending labs, recheck patient, and dispo appropriately.       Final diagnoses:  Nausea  Diarrhea, unspecified type  Right flank pain  History of lung cancer    ED Discharge Orders     None          Bernard Drivers, MD 04/11/24 1529

## 2024-04-11 NOTE — ED Provider Notes (Signed)
 Care taken over from Dr. Bernard.  Patient presented with some right flank pain little bit of nausea and diarrhea.  She has underlying history of lung cancer.  CT scan does not show any acute abnormality.  Urinalysis has small amount of white cells but other than the flank pain, denies dysuria or frequency.  Will send for culture.  She is adamant about being discharged.  She says she has been here too long and does not want to have any further discussion about her treatment.  Her troponins have been flat.  She was discharged home in good condition.  Will follow-up with her PCP.  Return precautions were given.   Lenor Hollering, MD 04/11/24 [phone]

## 2024-04-11 NOTE — ED Notes (Signed)
 PT TO CT

## 2024-04-13 LAB — URINE CULTURE: Culture: >=100000 — AB

## 2024-04-14 ENCOUNTER — Telehealth (HOSPITAL_BASED_OUTPATIENT_CLINIC_OR_DEPARTMENT_OTHER): Payer: Self-pay

## 2024-04-14 NOTE — Telephone Encounter (Signed)
 Post ED Visit - Positive Culture Follow-up  Culture report reviewed by antimicrobial stewardship pharmacist: [facility] []  [place]in [place], Pharm.D. []  Venetia Gully, Pharm.D., BCPS AQ-ID []  Garrel Crews, Pharm.D., BCPS []  Almarie Lunger, Pharm.D., BCPS []  [place], [address].D., BCPS, AAHIVP []  Rosaline Bihari, Pharm.D., BCPS, AAHIVP []  Vernell Meier, PharmD, BCPS []  Latanya Hint, PharmD, BCPS []  Donald Medley, PharmD, BCPS []  Rocky Bold, PharmD []  Dorothyann Alert, PharmD, BCPS []  Morene Babe, PharmD  [facility] []  Rosaline Edison, PharmD []  Romona Bliss, PharmD []  Dolphus Roller, PharmD []  Veva Seip, Rph []  Vernell Daunt) Leonce, PharmD []  Eva Allis, PharmD []  Rosaline Millet, PharmD []  Iantha Batch, PharmD []  Arvin Gauss, PharmD []  Wanda Hasting, PharmD []  Ronal Rav, PharmD []  Rocky Slade, PharmD []  Bard Jeans, PharmD [x]  Almarie Lunger, Pharm.D., BCPS  Positive urine culture No treatment indicated - asymptomatic bacteriuria  Chart appended  Gretta Avelina Buss 04/14/2024, 9:26 AM

## 2024-04-14 NOTE — Progress Notes (Signed)
 ED Antimicrobial Stewardship Positive Culture Follow Up   [name] is an 83 y.o. female who presented to [facility] on 04/11/2024 with a chief complaint of  Chief Complaint  Patient presents with   Flank Pain    Recent Results (from the past 720 hours)  Urine Culture     Status: Abnormal   Collection Time: 04/11/24  3:01 PM   Specimen: Urine, Clean Catch  Result Value Ref Range Status   Specimen Description   Final    URINE, CLEAN CATCH Performed at [facility], [address] [place], [place] [address]    Special Requests   Final    NONE Performed at [facility], [address] [place], [place] [address]    Culture >=100,000 COLONIES/mL ESCHERICHIA COLI (A)  Final   Report Status 04/13/2024 FINAL  Final   Organism ID, Bacteria ESCHERICHIA COLI (A)  Final      Susceptibility   Escherichia coli - MIC*    AMPICILLIN >=32 RESISTANT Resistant     CEFAZOLIN  (URINE) Value in next row Sensitive      4 SENSITIVEThis is a modified FDA-approved test that has been validated and its performance characteristics determined by the reporting laboratory.  This laboratory is certified under the Clinical Laboratory Improvement Amendments CLIA as qualified to perform high complexity clinical laboratory testing.    CEFEPIME Value in next row Sensitive      4 SENSITIVEThis is a modified FDA-approved test that has been validated and its performance characteristics determined by the reporting laboratory.  This laboratory is certified under the Clinical Laboratory Improvement Amendments CLIA as qualified to perform high complexity clinical laboratory testing.    ERTAPENEM Value in next row Sensitive      4 SENSITIVEThis is a modified FDA-approved test that has been validated and its performance characteristics determined by the reporting laboratory.  This laboratory is certified under the Clinical Laboratory Improvement Amendments CLIA as qualified to perform  high complexity clinical laboratory testing.    CEFTRIAXONE  Value in next row Sensitive      4 SENSITIVEThis is a modified FDA-approved test that has been validated and its performance characteristics determined by the reporting laboratory.  This laboratory is certified under the Clinical Laboratory Improvement Amendments CLIA as qualified to perform high complexity clinical laboratory testing.    CIPROFLOXACIN  Value in next row Intermediate      4 SENSITIVEThis is a modified FDA-approved test that has been validated and its performance characteristics determined by the reporting laboratory.  This laboratory is certified under the Clinical Laboratory Improvement Amendments CLIA as qualified to perform high complexity clinical laboratory testing.    GENTAMICIN Value in next row Sensitive      4 SENSITIVEThis is a modified FDA-approved test that has been validated and its performance characteristics determined by the reporting laboratory.  This laboratory is certified under the Clinical Laboratory Improvement Amendments CLIA as qualified to perform high complexity clinical laboratory testing.    NITROFURANTOIN Value in next row Sensitive      4 SENSITIVEThis is a modified FDA-approved test that has been validated and its performance characteristics determined by the reporting laboratory.  This laboratory is certified under the Clinical Laboratory Improvement Amendments CLIA as qualified to perform high complexity clinical laboratory testing.    TRIMETH/SULFA Value in next row Resistant      4 SENSITIVEThis is a modified FDA-approved test that has been validated and its performance characteristics determined by the reporting laboratory.  This laboratory is certified under the Clinical Laboratory Improvement Amendments CLIA as qualified to perform high complexity clinical laboratory testing.    AMPICILLIN/SULBACTAM Value in next row Intermediate      4 SENSITIVEThis is a modified FDA-approved test that has  been validated and its performance characteristics determined by the reporting laboratory.  This laboratory is certified under the Clinical Laboratory Improvement Amendments CLIA as qualified to perform high complexity clinical laboratory testing.    PIP/TAZO Value in next row Sensitive ug/mL     <=4 SENSITIVEThis is a modified FDA-approved test that has been validated and its performance characteristics determined by the reporting laboratory.  This laboratory is certified under the Clinical Laboratory Improvement Amendments CLIA as qualified to perform high complexity clinical laboratory testing.    MEROPENEM Value in next row Sensitive      <=4 SENSITIVEThis is a modified FDA-approved test that has been validated and its performance characteristics determined by the reporting laboratory.  This laboratory is certified under the Clinical Laboratory Improvement Amendments CLIA as qualified to perform high complexity clinical laboratory testing.    * >=100,000 COLONIES/mL ESCHERICHIA COLI    [x]  No treatment indicated  83 YOF with nausea/diarrhea, +R-flank pain however denied urinary symptoms, no dysuria or frequency. UA showed low WBC 6-10, neg nitrite. Renal CT without acute findings. The patient was discharged without a prescribed antimicrobial and culture felt to likely represent more asymptomatic bacteruria - no treatment indicated at this time.   No treatment indicated  ED Provider: Glendia Breeding  Thank you for allowing pharmacy to be a part of this patient's care.  Almarie Lunger, PharmD, BCPS, BCIDP Infectious Diseases Clinical Pharmacist 04/14/2024 8:36 AM   **Pharmacist phone directory can now be found on [email or web address] ([email or web address]).  Listed under [facility].

## 2024-04-22 ENCOUNTER — Ambulatory Visit: Admitting: Nurse Practitioner

## 2024-05-02 ENCOUNTER — Emergency Department (HOSPITAL_COMMUNITY)
Admission: EM | Admit: 2024-05-02 | Discharge: 2024-05-02 | Discharge status: Against Medical Advice | Attending: Emergency Medicine | Admitting: Emergency Medicine

## 2024-05-02 DIAGNOSIS — R0981 Nasal congestion: Secondary | ICD-10-CM | POA: Insufficient documentation

## 2024-05-02 DIAGNOSIS — Z5321 Procedure and treatment not carried out due to patient leaving prior to being seen by health care provider: Secondary | ICD-10-CM | POA: Insufficient documentation

## 2024-05-02 DIAGNOSIS — R531 Weakness: Secondary | ICD-10-CM | POA: Diagnosis not present

## 2024-05-02 DIAGNOSIS — R0602 Shortness of breath: Secondary | ICD-10-CM | POA: Insufficient documentation

## 2024-05-02 DIAGNOSIS — M549 Dorsalgia, unspecified: Secondary | ICD-10-CM | POA: Insufficient documentation

## 2024-05-02 LAB — RESP PANEL BY RT-PCR (RSV, FLU A&B, COVID)  RVPGX2
Influenza A by PCR: NEGATIVE
Influenza B by PCR: NEGATIVE
Resp Syncytial Virus by PCR: NEGATIVE
SARS Coronavirus 2 by RT PCR: NEGATIVE

## 2024-05-02 LAB — COMPREHENSIVE METABOLIC PANEL WITH GFR
ALT: 11 U/L (ref 0–44)
AST: 24 U/L (ref 15–41)
Albumin: 4.2 g/dL (ref 3.5–5.0)
Alkaline Phosphatase: 91 U/L (ref 38–126)
Anion gap: 15 (ref 5–15)
BUN: 27 mg/dL — ABNORMAL HIGH (ref 8–23)
CO2: 23 mmol/L (ref 22–32)
Calcium: 9.8 mg/dL (ref 8.9–10.3)
Chloride: 100 mmol/L (ref 98–111)
Creatinine, Ser: 1.9 mg/dL — ABNORMAL HIGH (ref 0.44–1.00)
GFR, Estimated: 26 mL/min — ABNORMAL LOW (ref >60)
Glucose, Bld: 95 mg/dL (ref 70–99)
Potassium: 5.2 mmol/L — ABNORMAL HIGH (ref 3.5–5.1)
Sodium: 137 mmol/L (ref 135–145)
Total Bilirubin: 0.2 mg/dL (ref 0.0–1.2)
Total Protein: 7.3 g/dL (ref 6.5–8.1)

## 2024-05-02 LAB — CBC WITH DIFFERENTIAL/PLATELET
Abs Immature Granulocytes: 0.03 K/uL (ref 0.00–0.07)
Basophils Absolute: 0.1 K/uL (ref 0.0–0.1)
Basophils Relative: 1 %
Eosinophils Absolute: 0.3 K/uL (ref 0.0–0.5)
Eosinophils Relative: 3 %
HCT: 32.9 % — ABNORMAL LOW (ref 36.0–46.0)
Hemoglobin: 10.2 g/dL — ABNORMAL LOW (ref 12.0–15.0)
Immature Granulocytes: 0 %
Lymphocytes Relative: 29 %
Lymphs Abs: 2.5 K/uL (ref 0.7–4.0)
MCH: 28.2 pg (ref 26.0–34.0)
MCHC: 31 g/dL (ref 30.0–36.0)
MCV: 90.9 fL (ref 80.0–100.0)
Monocytes Absolute: 0.9 K/uL (ref 0.1–1.0)
Monocytes Relative: 11 %
Neutro Abs: 5 K/uL (ref 1.7–7.7)
Neutrophils Relative %: 56 %
Platelets: 317 K/uL (ref 150–400)
RBC: 3.62 MIL/uL — ABNORMAL LOW (ref 3.87–5.11)
RDW: 13.9 % (ref 11.5–15.5)
WBC: 8.7 K/uL (ref 4.0–10.5)
nRBC: 0 % (ref 0.0–0.2)

## 2024-05-02 NOTE — ED Triage Notes (Signed)
 Pt recently getting over covid, state she feels she now has pneumonia. Pt has congestion, back pain, is on O2 @ 3 lpm

## 2024-05-02 NOTE — ED Notes (Signed)
 Patient daughter ask me to come get the oxygen  tank because they were leaving and they left

## 2024-05-02 NOTE — ED Notes (Signed)
Pt c/o chest pain 

## 2024-05-13 ENCOUNTER — Inpatient Hospital Stay: Payer: 59 | Attending: Internal Medicine

## 2024-05-13 ENCOUNTER — Ambulatory Visit (HOSPITAL_COMMUNITY)
Admission: RE | Admit: 2024-05-13 | Discharge: 2024-05-13 | Disposition: A | Discharge status: Home / Self Care | Source: Ambulatory Visit | Attending: Physician Assistant | Admitting: Physician Assistant

## 2024-05-13 DIAGNOSIS — D509 Iron deficiency anemia, unspecified: Secondary | ICD-10-CM | POA: Diagnosis present

## 2024-05-13 DIAGNOSIS — C3490 Malignant neoplasm of unspecified part of unspecified bronchus or lung: Secondary | ICD-10-CM | POA: Insufficient documentation

## 2024-05-13 DIAGNOSIS — N189 Chronic kidney disease, unspecified: Secondary | ICD-10-CM | POA: Insufficient documentation

## 2024-05-13 DIAGNOSIS — C3431 Malignant neoplasm of lower lobe, right bronchus or lung: Secondary | ICD-10-CM | POA: Diagnosis present

## 2024-05-13 DIAGNOSIS — J441 Chronic obstructive pulmonary disease with (acute) exacerbation: Secondary | ICD-10-CM | POA: Insufficient documentation

## 2024-05-13 DIAGNOSIS — Z923 Personal history of irradiation: Secondary | ICD-10-CM | POA: Diagnosis not present

## 2024-05-13 DIAGNOSIS — D649 Anemia, unspecified: Secondary | ICD-10-CM | POA: Insufficient documentation

## 2024-05-13 DIAGNOSIS — Z8616 Personal history of COVID-19: Secondary | ICD-10-CM | POA: Insufficient documentation

## 2024-05-13 LAB — CBC WITH DIFFERENTIAL (CANCER CENTER ONLY)
Abs Immature Granulocytes: 0.01 K/uL (ref 0.00–0.07)
Basophils Absolute: 0 K/uL (ref 0.0–0.1)
Basophils Relative: 0 %
Eosinophils Absolute: 0.1 K/uL (ref 0.0–0.5)
Eosinophils Relative: 1 %
HCT: 26.4 % — ABNORMAL LOW (ref 36.0–46.0)
Hemoglobin: 8.6 g/dL — ABNORMAL LOW (ref 12.0–15.0)
Immature Granulocytes: 0 %
Lymphocytes Relative: 25 %
Lymphs Abs: 1.9 K/uL (ref 0.7–4.0)
MCH: 28.6 pg (ref 26.0–34.0)
MCHC: 32.6 g/dL (ref 30.0–36.0)
MCV: 87.7 fL (ref 80.0–100.0)
Monocytes Absolute: 0.7 K/uL (ref 0.1–1.0)
Monocytes Relative: 10 %
Neutro Abs: 4.6 K/uL (ref 1.7–7.7)
Neutrophils Relative %: 64 %
Platelet Count: 240 K/uL (ref 150–400)
RBC: 3.01 MIL/uL — ABNORMAL LOW (ref 3.87–5.11)
RDW: 13.8 % (ref 11.5–15.5)
WBC Count: 7.4 K/uL (ref 4.0–10.5)
nRBC: 0 % (ref 0.0–0.2)

## 2024-05-13 LAB — IRON AND IRON BINDING CAPACITY (CC-WL,HP ONLY)
Iron: 70 ug/dL (ref 28–170)
Saturation Ratios: 19 % (ref 10.4–31.8)
TIBC: 378 ug/dL (ref 250–450)
UIBC: 308 ug/dL (ref 148–442)

## 2024-05-13 LAB — FERRITIN: Ferritin: 28 ng/mL (ref 11–307)

## 2024-05-18 ENCOUNTER — Encounter: Payer: Self-pay | Admitting: Adult Health

## 2024-05-18 ENCOUNTER — Ambulatory Visit: Admitting: Adult Health

## 2024-05-18 VITALS — BP 138/62 | HR 57 | Temp 97.7°F | Ht 62.0 in | Wt 137.4 lb

## 2024-05-18 DIAGNOSIS — J441 Chronic obstructive pulmonary disease with (acute) exacerbation: Secondary | ICD-10-CM | POA: Diagnosis not present

## 2024-05-18 DIAGNOSIS — J9611 Chronic respiratory failure with hypoxia: Secondary | ICD-10-CM | POA: Diagnosis not present

## 2024-05-18 DIAGNOSIS — C3431 Malignant neoplasm of lower lobe, right bronchus or lung: Secondary | ICD-10-CM

## 2024-05-18 DIAGNOSIS — J449 Chronic obstructive pulmonary disease, unspecified: Secondary | ICD-10-CM

## 2024-05-18 MED ORDER — AMOXICILLIN-POT CLAVULANATE 875-125 MG PO TABS: 1.0000 | ORAL_TABLET | Freq: Two times a day (BID) | ORAL | 0 refills | Status: DC

## 2024-05-18 MED ORDER — PREDNISONE 10 MG PO TABS: ORAL_TABLET | ORAL | 0 refills | Status: DC

## 2024-05-18 MED ORDER — BENZONATATE 200 MG PO CAPS: 200.0000 mg | ORAL_CAPSULE | Freq: Three times a day (TID) | ORAL | 3 refills | Status: AC | PRN

## 2024-05-18 MED ORDER — METHYLPREDNISOLONE ACETATE 80 MG/ML IJ SUSP
80.0000 mg | Freq: Once | INTRAMUSCULAR | Status: AC
  Administered 2024-05-18: 80 mg via INTRAMUSCULAR

## 2024-05-18 MED ORDER — TRELEGY ELLIPTA 200-62.5-25 MCG/ACT IN AEPB: 1.0000 | INHALATION_SPRAY | Freq: Every day | RESPIRATORY_TRACT | 3 refills | Status: AC

## 2024-05-18 NOTE — Progress Notes (Signed)
 @Patient  ID: [name], female    DOB: 15-Apr-1941, 83 y.o.   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 2D echo Dec 30, 2023 EF 65 to 70%, right ventricular systolic function normal, RV size normal  Discussed the use of AI scribe software for clinical note transcription with the patient, who gave verbal consent to proceed.  History of Present Illness [name] is an 83 year old female with COPD who presents for a acute office visit for ongoing symptoms following a recent COVID-19 infection.  She has experienced worsening breathing difficulties since a COVID-19 infection approximately three weeks ago, necessitating increased use of her albuterol  inhaler. She was treated with Paxlovid and prednisone  for five days, which ended few days ago. SABRA Despite these treatments, she continues to experience wheezing and coughing.  She took an albuterol  nebulizer treatment prior to coming in today.  As above she has a history of non-small cell lung cancer with previous radiation treatment surveillance CT imaging last week showed stable nodularity without new suspicious or recurrent disease .  She has follow-up with oncology later this week.   Her current medications include Trelegy inhaler once daily, albuterol  inhaler and nebulizer As needed    She remains on oxygen  3 L.  Typically oxygen  levels are higher on her continuous flow oxygen  averaging 96 to 97%.  But drops  to 91-92 on her portable system.    She has not been able to attend church since her COVID-19 infection  She is allergic to most antibiotics except amoxicillin . She has received flu shots and COVID-19 vaccinations, noting this is her third COVID-19 infection, which she states takes her a long time to recover from each time.  No hemoptysis. . No diabetes.     Allergies  Allergen Reactions   Avelox [Moxifloxacin Hcl In Nacl] Palpitations and Other (See Comments)    Caused Heart Attack    Azithromycin  Swelling and Other (See Comments)    Patient reported past history of lip swelling   Codeine Other (See Comments)    Dr. Levern advised patient not to take this medication   Doxycycline  Swelling and Other (See Comments)    Mouth, lips, feet swelling   Hydromorphone  Palpitations and Other (See Comments)    DILAUDID   -  Pt had a Heart Attack after taking Dilaudid .   Levaquin  [Levofloxacin ] Shortness Of Breath and Other (See Comments)    Chest pressure, SOB, pain in between shoulder blades, sweaty -as reported by patient per experience in ED this afternoon   Vitamin D Analogs Swelling and Other (See Comments)    Face and lips swell, but no breathing issues   Nifedipine Er Other (See Comments)    Dropped the heart rate too low   Octreotide      Chest pain, N/V   Zetia [Ezetimibe] Other (See Comments)    Made me feel like my face was going to sleep/numb   Oxycodone-Acetaminophen  Other (See Comments)  Says it makes her feel weird   Risedronate Other (See Comments)    Chest pain    Immunization History  Administered Date(s) Administered   Fluad Quad(high Dose 65+) 04/24/2018, 04/07/2019, 05/25/2020, 04/27/2021, 04/25/2022   INFLUENZA, HIGH DOSE SEASONAL PF 05/22/2013, 07/13/2014, 04/24/2018, 05/13/2018, 04/07/2019   Influenza Split 05/15/2012   Influenza Whole 05/13/2012   Influenza,inj,Quad PF,6+ Mos 05/15/2012, 05/15/2012, 04/14/2015, 05/18/2015, 05/18/2015, 04/10/2016,  04/10/2016, 04/30/2017, 04/30/2017, 04/24/2018   Influenza-Unspecified 05/15/2012, 05/22/2013, 07/13/2014, 05/18/2015, 04/10/2016   PFIZER(Purple Top)SARS-COV-2 Vaccination 09/28/2019, 10/20/2019, 06/17/2020   Pfizer Covid-19 Vaccine Bivalent Booster 49yrs & up 07/26/2021   Pfizer(Comirnaty)Fall Seasonal Vaccine 12 years and older 06/17/2023   Pneumococcal Conjugate-13 05/15/2012   Pneumococcal Polysaccharide-23 11/30/2014    Past Medical History:  Diagnosis Date   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HCC)    Arnold-Chiari malformation (HCC) 1998   Asthma    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   Blood in stool    last week of aug 2018   CAP (community acquired pneumonia) 10/22/2015   Chronic kidney disease    COPD (chronic obstructive pulmonary disease) (HCC)    Coronary artery disease    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   Diverticulitis    Dyspnea    with exertion   Gastroesophageal reflux disease    occ   Glaucoma    right eye   Headache syndrome 11/27/2018   Hiatal hernia    History of shingles 06/23/2018   Hyperlipidemia    Hypertension    Lung cancer (HCC) dx 2018   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   Myocardial infarction (HCC) 01/01/2000   Cardiac catheterization   Peripheral vascular disease    stents in legs x 2 or 3   Pneumonia    last time winter 2017 -2018   PONV (postoperative nausea and vomiting)    occassionally, last colonscopy did ok with anesthesia   Primary cancer of right lower lobe of lung (HCC) 04/25/2016   Right-sided carotid artery disease 01/07/2013   TIA (transient ischemic attack) 02/11/2019   Wears dentures    Full set   Wears glasses     Tobacco History: Social History   Tobacco Use  Smoking Status Former   Current packs/day: 0.00   Average packs/day: 1.5 packs/day for 30.0 years (45.0 ttl pk-yrs)   Types: Cigarettes   Start date: 08/13/1970   Quit date: 08/13/2000   Years since  quitting: 23.7   Passive exposure: Never  Smokeless Tobacco Never  Tobacco Comments   Smoked for about 40 years - smoked about 1/2 ppd   Counseling given: Not Answered Tobacco comments: Smoked for about 40 years - smoked about 1/2 ppd   Outpatient Medications Prior to Visit  Medication Sig Dispense Refill   acetaminophen  (TYLENOL ) 500 MG tablet Take 500-1,000 mg by mouth every 6 (six) hours as needed (for pain).     albuterol  (PROVENTIL ) (2.5 MG/3ML) 0.083% nebulizer solution Take 3 mLs (2.5 mg total) by nebulization 3 (three) times daily for 5 days, THEN 3 mLs (2.5 mg total) every 6 (six) hours as needed for wheezing or shortness of breath. 75 mL 0   albuterol  (VENTOLIN  HFA) 108 (90 Base) MCG/ACT inhaler Inhale 2 puffs into the lungs every 6 (six) hours as needed for wheezing or shortness of breath. 8 g 6   atorvastatin  (LIPITOR) 40 MG tablet Take 1 tablet (40 mg total) by mouth at bedtime. 30 tablet 6   cetirizine  (  ZYRTEC ) 10 MG tablet Take 10 mg by mouth daily as needed for allergies.      clopidogrel  (PLAVIX ) 75 MG tablet Take 0.5 tablets (37.5 mg total) by mouth in the morning.     dexlansoprazole  (DEXILANT ) 60 MG capsule Take 60 mg by mouth daily before breakfast.     furosemide  (LASIX ) 40 MG tablet Take 20 mg by mouth in the morning.     nitroGLYCERIN  (NITRODUR - DOSED IN MG/24 HR) 0.2 mg/hr patch Place 0.2 mg onto the skin daily. Patient wears patch for 12 hours and removes     nitroGLYCERIN  (NITROSTAT ) 0.4 MG SL tablet Place 1 tablet (0.4 mg total) under the tongue every 5 (five) minutes x 3 doses as needed for chest pain. 10 tablet 0   olmesartan  (BENICAR ) 20 MG tablet Take 1 tablet (20 mg total) by mouth daily. (Patient taking differently: Take 10 mg by mouth daily.) 90 tablet 1   ondansetron  (ZOFRAN ) 4 MG tablet Take 1 tablet (4 mg total) by mouth every 6 (six) hours as needed for nausea. 20 tablet 0   TRELEGY ELLIPTA  200-62.5-25 MCG/ACT AEPB USE 1 INHALATION BY MOUTH DAILY 180  each 3   methylPREDNISolone  (MEDROL ) 4 MG tablet Take 20 mg by mouth in the morning and at bedtime. (Patient not taking: Reported on 05/18/2024)     predniSONE  (DELTASONE ) 10 MG tablet Take 40mg  daily for 3 days, then 30mg  daily for 3 days, then 20mg  daily for 3 days, then 10mg  daily for 3 days, then stop (Patient not taking: Reported on 05/18/2024) 30 tablet 0   predniSONE  (DELTASONE ) 20 MG tablet Take 20 mg by mouth 2 (two) times daily. (Patient not taking: Reported on 05/18/2024)     No facility-administered medications prior to visit.     Review of Systems:   Constitutional:   No  weight loss, night sweats,  Fevers, chills,+ fatigue, or  lassitude.  HEENT:   No headaches,  Difficulty swallowing,  Tooth/dental problems, or  Sore throat,                No sneezing, itching, ear ache, nasal congestion, post nasal drip,   CV:  No chest pain,  Orthopnea, PND, swelling in lower extremities, anasarca, dizziness, palpitations, syncope.   GI  No heartburn, indigestion, abdominal pain, nausea, vomiting, diarrhea, change in bowel habits, loss of appetite, bloody stools.   Resp:   No chest wall deformity  Skin: no rash or lesions.  GU: no dysuria, change in color of urine, no urgency or frequency.  No flank pain, no hematuria   MS:  No joint pain or swelling.  No decreased range of motion.  No back pain.    Physical Exam  BP 138/62   Pulse (!) 57   Temp 97.7 F (36.5 C)   Ht 5' 2 (1.575 m)   Wt 137 lb 6.4 oz (62.3 kg)   SpO2 92% Comment: 3L pulse  BMI 25.13 kg/m   GEN: A/Ox3; pleasant , NAD, well nourished, elderly, on oxygen    HEENT:  [place]/AT,  , NOSE-clear, THROAT-clear, no lesions, no postnasal drip or exudate noted.   NECK:  Supple w/ fair ROM; no JVD; normal carotid impulses w/o bruits; no thyromegaly or nodules palpated; no lymphadenopathy.    RESP, scattered rhonchi with a few expiratory wheezes, talks in full sentences.  no accessory muscle use, no dullness to  percussion  CARD:  RRR, no m/r/g, no peripheral edema, pulses intact, no cyanosis or clubbing.  GI:   Soft & nt; nml bowel sounds; no organomegaly or masses detected.   Musco: Warm bil, no deformities or joint swelling noted.   Neuro: alert, no focal deficits noted.    Skin: Warm, no lesions or rashes    Lab Results:    BMET   Imaging: CT CHEST WO CONTRAST Result Date: 05/14/2024 CLINICAL DATA:  Non-small cell lung cancer (NSCLC), non-metastatic, assess treatment response. * Tracking Code: BO * EXAM: CT CHEST WITHOUT CONTRAST TECHNIQUE: Multidetector CT imaging of the chest was performed following the standard protocol without IV contrast. RADIATION DOSE REDUCTION: This exam was performed according to the departmental dose-optimization program which includes automated exposure control, adjustment of the mA and/or kV according to patient size and/or use of iterative reconstruction technique. COMPARISON:  CT angiography chest from 11/20/2023. FINDINGS: Cardiovascular: Normal cardiac size. No pericardial effusion. No aortic aneurysm. There are coronary artery calcifications, in keeping with coronary artery disease. There are also moderate peripheral atherosclerotic vascular calcifications of thoracic aorta and its major branches. Mediastinum/Nodes: Visualized thyroid  gland appears grossly unremarkable. No solid / cystic mediastinal masses. The esophagus is nondistended precluding optimal assessment. There are few mildly prominent mediastinal lymph nodes, which do not meet the size criteria for lymphadenopathy and appear grossly similar to the prior study, favoring benign etiology. No axillary lymphadenopathy by size criteria. Evaluation of bilateral hila is limited due to lack on intravenous contrast: however, no large hilar lymphadenopathy identified. Lungs/Pleura: The central tracheo-bronchial tree is patent. Stable biapical pleural-parenchymal disease. Redemonstration of irregular solid  noncalcified mass in the right lung lower lobe with adjacent linear areas of fibrosis/scarring. The mass currently measures 2.2 x 2.4 cm, essentially similar to the prior study, when remeasured in similar fashion. There is stable subpleural subcentimeter focus of calcification in the anterior segment of right upper lobe, inferiorly abutting the minor fissure. No new mass or consolidation. No pleural effusion or pneumothorax. No suspicious lung nodules. Upper Abdomen: Redemonstration of subcapsular 1.3 x 1.5 cm cyst in the left hepatic lobe, segment 2. Surgically absent gallbladder. Remaining visualized upper abdominal viscera within normal limits. Musculoskeletal: The visualized soft tissues of the chest wall are grossly unremarkable. No suspicious osseous lesions. Redemonstration of nonunited fractures of the posteromedial right seventh and eighth ribs. There are mild multilevel degenerative changes in the visualized spine. IMPRESSION: 1. Essentially stable exam. Redemonstration of irregular solid noncalcified mass in the right lung lower lobe with adjacent linear areas of fibrosis/scarring. No significant interval change. No new lung mass or consolidation. No new lymphadenopathy. 2. Multiple other nonacute observations, as described above. Aortic Atherosclerosis (ICD10-I70.0). Electronically Signed   By: Ree Molt M.D.   On: 05/14/2024 09:00    methylPREDNISolone  acetate (DEPO-MEDROL ) injection 80 mg     Date Action Dose Route User   05/18/2024 1055 Given 80 mg Intramuscular (Left Upper Outer Quadrant) Heater, Duwaine RAMAN, CMA          Latest Ref Rng & Units 03/08/2023    2:27 PM 08/26/2018   12:59 PM 02/14/2018   11:18 AM 04/10/2016    9:11 AM  PFT Results  FVC-Pre L 1.84  1.69  1.80  P 1.53   FVC-Predicted Pre % 77  65  69  P 57   FVC-Post L 1.96  1.91  1.99  P 1.82   FVC-Predicted Post % 82  74  77  P 69   Pre FEV1/FVC % % 65  67  72  P 71   Post FEV1/FCV % %  67  66  72  P 70   FEV1-Pre L  1.20  1.12  1.30  P 1.09   FEV1-Predicted Pre % 68  58  67  P 54   FEV1-Post L 1.31  1.26  1.43  P 1.28   DLCO uncorrected ml/min/mmHg 11.14  11.93   11.07   DLCO UNC% % 63  55   51   DLCO corrected ml/min/mmHg 11.14  13.35     DLCO COR %Predicted % 63  61     DLVA Predicted % 91  97   86   TLC L 4.10  3.85   3.77   TLC % Predicted % 86  81   79   RV % Predicted % 98  94   96     P Preliminary result    No results found for: NITRICOXIDE      Assessment & Plan:   Assessment and Plan Assessment & Plan Chronic obstructive pulmonary disease (COPD) with acute exacerbation and secondary bronchitis following COVID-19 infection   COPD exacerbation with secondary bronchitis post-COVID-19 infection presents with persistent wheezing and coughing despite brief prednisone  burst. Oxygen  saturation adequate on 3L,  CT scan 10/1 shows no pneumonia, new lung masses, or suspicious nodules.  She is high risk for decompensation- Administer a Depo medrol  shot today-80mg  IM . Prescribe Augmentin  twice daily with food and a prednisone  taper over the next week, starting tomorrow. Prescribe Robitussin DM, one teaspoon every four hours, and cough pearls to be taken with Robitussin DM.  Advise continued use of the home oxygen  device. Schedule follow-up in six weeks to assess the need for additional medication to prevent exacerbations. Discuss potential future use of Fasenra or Dupixent if exacerbations persist.  Covid 19 infection -lingering upper respiratory symptoms.  Patient completed a 5-day course of antiviral therapy.  Recent CT chest showed no evidence of secondary pneumonia. Continue symptom management.  Chronic respiratory failure continue on oxygen  to maintain O2 saturations greater than 88 to 90%.  Continue on 3 L at baseline.  No increased oxygen  demands at this time  Non-small cell right lower lobe lung cancer, status post radiation therapy   Lung cancer with fibrotic scarring from previous  radiation therapy shows stable changes on CT chest May 13, 2024  Continue surveillance CT imaging.  Follow-up with oncology as planned this week   Plan  Patient Instructions  Begin Augmentin  875 Twice daily  for 1 week, take with food  Depo Medrol  shot today in office.  Prednisone  taper over next week, take with food. (Begin tomorrow with Breakfast) Robitussin DM 1 tsp every 4hr as needed for cough  Tessalon  Three times a day  As needed  cough  Continue on Trelegy 1 puff daily  Albuterol  inhaler or neb As needed   Continue on Oxygen  3l/m to keep O2 sats >88-90% Follow up with Oncology this week as planned Follow up in 6 weeks and As needed   Please contact office for sooner follow up if symptoms do not improve or worsen or seek emergency care           I spent 43   minutes dedicated to the care of this patient on the date of this encounter to include pre-visit review of records, face-to-face time with the patient discussing conditions above, post visit ordering of testing, clinical documentation with the electronic health record, making appropriate referrals as documented, and communicating necessary findings to members of the patients care team.  Madelin Stank, NP 05/18/2024

## 2024-05-18 NOTE — Patient Instructions (Addendum)
 Begin Augmentin  875 Twice daily  for 1 week, take with food  Depo Medrol  shot today in office.  Prednisone  taper over next week, take with food. (Begin tomorrow with Breakfast) Robitussin DM 1 tsp every 4hr as needed for cough  Tessalon  Three times a day  As needed  cough  Continue on Trelegy 1 puff daily  Albuterol  inhaler or neb As needed   Continue on Oxygen  3l/m to keep O2 sats >88-90% Follow up with Oncology this week as planned Follow up in 6 weeks and As needed   Please contact office for sooner follow up if symptoms do not improve or worsen or seek emergency care

## 2024-05-19 ENCOUNTER — Telehealth: Payer: Self-pay | Admitting: Medical Oncology

## 2024-05-20 ENCOUNTER — Telehealth: Payer: Self-pay | Admitting: Internal Medicine

## 2024-05-20 ENCOUNTER — Inpatient Hospital Stay (HOSPITAL_BASED_OUTPATIENT_CLINIC_OR_DEPARTMENT_OTHER): Payer: 59 | Admitting: Internal Medicine

## 2024-05-20 ENCOUNTER — Telehealth: Payer: Self-pay

## 2024-05-20 VITALS — BP 136/78 | HR 58 | Temp 97.7°F | Resp 17 | Ht 62.0 in | Wt 138.0 lb

## 2024-05-20 DIAGNOSIS — C3490 Malignant neoplasm of unspecified part of unspecified bronchus or lung: Secondary | ICD-10-CM

## 2024-05-20 DIAGNOSIS — C3431 Malignant neoplasm of lower lobe, right bronchus or lung: Secondary | ICD-10-CM | POA: Diagnosis not present

## 2024-05-20 NOTE — Telephone Encounter (Signed)
 Returned call to pt regarding next appts. Pt is aware and agreeable for next appts.

## 2024-05-20 NOTE — Telephone Encounter (Signed)
 Scheduled patient for next appointments. Called and left a voicemail with the appointment details.

## 2024-05-20 NOTE — Progress Notes (Signed)
 [facility] [phone]   Fax:([phone]) [phone]  OFFICE PROGRESS NOTE  Pura Lenis, MD [address] [place] [address]  DIAGNOSIS:  1) stage IA non-small cell lung cancer, squamous cell carcinoma presented with right lower lobe pulmonary nodule diagnosed in August 2017. 2) persistent anemia questionable for anemia of chronic disease plus/minus iron  deficiency.  PRIOR THERAPY:  1) Feraheme  infusion on as-needed basis. 2) stereotactic radiotherapy to the right lower lobe lung nodule under the care of Dr. Patrcia.  CURRENT THERAPY: Observation.  INTERVAL HISTORY: [name] 83 y.o. female returns to the clinic today for follow-up visit accompanied by her grandson.  Discussed the use of AI scribe software for clinical note transcription with the patient, who gave verbal consent to proceed.  History of Present Illness [name] is an 83 year old female with stage 1A non-small cell lung cancer who presents for a repeat CT scan of the chest for restaging. She is accompanied by her grandson, Richard's son.  She has been feeling 'lousy' for the past three weeks, initially attributed to COVID-19, which subsequently developed into bronchitis. She started treatment with steroids and antibiotics yesterday, prescribed by her lung doctor.  Her medical history includes stage 1A non-small cell lung cancer, squamous cell carcinoma, diagnosed in August 2017, and she is status post stereotactic radiotherapy.  She has persistent iron  deficiency anemia, previously treated with iron  infusions. Recent lab work shows a hemoglobin level of 8.6, decreased from a previous level of 10.2. Her ferritin and serum iron  levels are within normal limits, with ferritin at 28, serum iron  at 70, and iron  saturation at 19%. Her hemoglobin has been in the 8 range previously, and she received blood transfusions when it dropped to 7.5.  She uses oxygen  at three  liters continuously at home and while sleeping, trying to minimize its use as much as possible.    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HCC)    Arnold-Chiari malformation (HCC) 1998   Asthma    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   Blood in stool    last week of aug 2018   CAP (community acquired pneumonia) 10/22/2015   Chronic kidney disease    COPD (chronic obstructive pulmonary disease) (HCC)    Coronary artery disease    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   Diverticulitis    Dyspnea    with exertion   Gastroesophageal reflux disease    occ   Glaucoma    right eye   Headache syndrome 11/27/2018   Hiatal hernia    History of shingles 06/23/2018   Hyperlipidemia    Hypertension    Lung cancer (HCC) dx 2018   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   Myocardial infarction (HCC) 01/01/2000   Cardiac catheterization   Peripheral vascular disease    stents in legs x 2 or 3   Pneumonia    last time winter 2017 -2018   PONV (postoperative nausea and vomiting)    occassionally, last colonscopy did ok with anesthesia   Primary cancer of right lower lobe of lung (HCC) 04/25/2016   Right-sided carotid artery disease 01/07/2013   TIA (transient ischemic attack) 02/11/2019   Wears dentures    Full set   Wears glasses     ALLERGIES:  is allergic to avelox [moxifloxacin hcl in nacl], azithromycin , codeine, doxycycline , hydromorphone ,  levaquin  [levofloxacin ], vitamin d analogs, nifedipine er, octreotide , zetia [ezetimibe], oxycodone-acetaminophen , and risedronate.  MEDICATIONS:  Current Outpatient Medications  Medication Sig Dispense Refill   acetaminophen  (TYLENOL ) 500 MG tablet Take 500-1,000 mg by mouth every 6 (six) hours as needed (for pain).     albuterol  (PROVENTIL ) (2.5 MG/3ML) 0.083% nebulizer solution Take 3 mLs (2.5 mg total) by nebulization 3 (three) times daily  for 5 days, THEN 3 mLs (2.5 mg total) every 6 (six) hours as needed for wheezing or shortness of breath. 75 mL 0   albuterol  (VENTOLIN  HFA) 108 (90 Base) MCG/ACT inhaler Inhale 2 puffs into the lungs every 6 (six) hours as needed for wheezing or shortness of breath. 8 g 6   amoxicillin -clavulanate (AUGMENTIN ) 875-125 MG tablet Take 1 tablet by mouth 2 (two) times daily. 14 tablet 0   atorvastatin  (LIPITOR) 40 MG tablet Take 1 tablet (40 mg total) by mouth at bedtime. 30 tablet 6   benzonatate  (TESSALON ) 200 MG capsule Take 1 capsule (200 mg total) by mouth 3 (three) times daily as needed. 45 capsule 3   cetirizine  (ZYRTEC ) 10 MG tablet Take 10 mg by mouth daily as needed for allergies.      clopidogrel  (PLAVIX ) 75 MG tablet Take 0.5 tablets (37.5 mg total) by mouth in the morning.     dexlansoprazole  (DEXILANT ) 60 MG capsule Take 60 mg by mouth daily before breakfast.     Fluticasone -Umeclidin-Vilant (TRELEGY ELLIPTA ) 200-62.5-25 MCG/ACT AEPB Inhale 1 puff into the lungs daily. 3 each 3   furosemide  (LASIX ) 40 MG tablet Take 20 mg by mouth in the morning.     nitroGLYCERIN  (NITRODUR - DOSED IN MG/24 HR) 0.2 mg/hr patch Place 0.2 mg onto the skin daily. Patient wears patch for 12 hours and removes     nitroGLYCERIN  (NITROSTAT ) 0.4 MG SL tablet Place 1 tablet (0.4 mg total) under the tongue every 5 (five) minutes x 3 doses as needed for chest pain. 10 tablet 0   olmesartan  (BENICAR ) 20 MG tablet Take 1 tablet (20 mg total) by mouth daily. (Patient taking differently: Take 10 mg by mouth daily.) 90 tablet 1   ondansetron  (ZOFRAN ) 4 MG tablet Take 1 tablet (4 mg total) by mouth every 6 (six) hours as needed for nausea. 20 tablet 0   predniSONE  (DELTASONE ) 10 MG tablet 4 tabs for 2 days, then 3 tabs for 2 days, 2 tabs for 2 days, then 1 tab for 2 days, then stop 20 tablet 0   No current facility-administered medications for this visit.    SURGICAL HISTORY:  Past Surgical History:  Procedure  Laterality Date   ABDOMINAL HYSTERECTOMY     APPENDECTOMY     Arnold-chiari malformation repair  1998   Suboccipital craniectomy   CAROTID ENDARTERECTOMY  03/29/2010   Left  CEA   CHOLECYSTECTOMY     Gall Bladder   COLONOSCOPY WITH PROPOFOL  N/A 04/22/2015   Procedure: COLONOSCOPY WITH PROPOFOL ;  Surgeon: Belvie Just, MD;  Location: [facility];  Service: Endoscopy;  Laterality: N/A;   COLONOSCOPY WITH PROPOFOL  N/A 05/25/2016   Procedure: COLONOSCOPY WITH PROPOFOL ;  Surgeon: Belvie Just, MD;  Location: [facility];  Service: Endoscopy;  Laterality: N/A;   COLONOSCOPY WITH PROPOFOL  N/A 05/03/2017   Procedure: COLONOSCOPY WITH PROPOFOL ;  Surgeon: Just Belvie, MD;  Location: [facility];  Service: Endoscopy;  Laterality: N/A;   COLONOSCOPY WITH PROPOFOL  N/A 06/29/2020   Procedure: COLONOSCOPY WITH PROPOFOL ;  Surgeon: Just Belvie, MD;  Location: [facility];  Service:  Endoscopy;  Laterality: N/A;   COLONOSCOPY WITH PROPOFOL  N/A 01/05/2021   Procedure: COLONOSCOPY WITH PROPOFOL ;  Surgeon: Rollin Dover, MD;  Location: [facility];  Service: Endoscopy;  Laterality: N/A;   COLONOSCOPY WITH PROPOFOL  N/A 09/28/2021   Procedure: COLONOSCOPY WITH PROPOFOL ;  Surgeon: Rollin Dover, MD;  Location: [facility] ENDOSCOPY;  Service: Endoscopy;  Laterality: N/A;   CORNEAL TRANSPLANT     Right   CORONARY ARTERY BYPASS GRAFT  01/01/2000   x 3   ENDARTERECTOMY Right 09/26/2021   Procedure: RIGHT CAROTID ENDARTERECTOMY;  Surgeon: Eliza Lonni RAMAN, MD;  Location: [facility] OR;  Service: Vascular;  Laterality: Right;   ENTEROSCOPY N/A 02/27/2018   Procedure: ENTEROSCOPY;  Surgeon: Rollin Dover, MD;  Location: [facility];  Service: Endoscopy;  Laterality: N/A;   ENTEROSCOPY N/A 07/18/2018   Procedure: ENTEROSCOPY;  Surgeon: Rollin Dover, MD;  Location: [facility];  Service: Endoscopy;  Laterality: N/A;   ENTEROSCOPY N/A 06/29/2020   Procedure: ENTEROSCOPY;  Surgeon: Rollin Dover, MD;  Location: [facility];   Service: Endoscopy;  Laterality: N/A;   ENTEROSCOPY N/A 12/05/2021   Procedure: ENTEROSCOPY;  Surgeon: Rollin Dover, MD;  Location: [facility];  Service: Gastroenterology;  Laterality: N/A;   ENTEROSCOPY N/A 12/25/2021   Procedure: ENTEROSCOPY;  Surgeon: Rollin Dover, MD;  Location: [facility];  Service: Gastroenterology;  Laterality: N/A;  with endoscopic placement of video capsule   ENTEROSCOPY N/A 02/17/2024   Procedure: ENTEROSCOPY;  Surgeon: Rollin Dover, MD;  Location: [facility];  Service: Gastroenterology;  Laterality: N/A;   ESOPHAGOGASTRODUODENOSCOPY N/A 05/25/2016   Procedure: ESOPHAGOGASTRODUODENOSCOPY (EGD);  Surgeon: Dover Rollin, MD;  Location: [facility];  Service: Endoscopy;  Laterality: N/A;   ESOPHAGOGASTRODUODENOSCOPY N/A 08/01/2018   Procedure: ESOPHAGOGASTRODUODENOSCOPY (EGD);  Surgeon: Rollin Dover, MD;  Location: [facility];  Service: Endoscopy;  Laterality: N/A;   ESOPHAGOGASTRODUODENOSCOPY (EGD) WITH PROPOFOL  N/A 09/28/2021   Procedure: ESOPHAGOGASTRODUODENOSCOPY (EGD) WITH PROPOFOL ;  Surgeon: Rollin Dover, MD;  Location: [facility] ENDOSCOPY;  Service: Endoscopy;  Laterality: N/A;   EYE SURGERY Right 1995 or 1996   Laser surgery for retinal hemorrhage   GIVENS CAPSULE STUDY N/A 07/16/2018   Procedure: GIVENS CAPSULE STUDY;  Surgeon: Rollin Dover, MD;  Location: [facility];  Service: Endoscopy;  Laterality: N/A;   GIVENS CAPSULE STUDY N/A 12/14/2021   Procedure: GIVENS CAPSULE STUDY;  Surgeon: Rollin Dover, MD;  Location: [facility] ENDOSCOPY;  Service: Gastroenterology;  Laterality: N/A;   GIVENS CAPSULE STUDY N/A 12/25/2021   Procedure: GIVENS CAPSULE STUDY;  Surgeon: Rollin Dover, MD;  Location: [facility];  Service: Gastroenterology;  Laterality: N/A;   HEMOSTASIS CLIP PLACEMENT  06/29/2020   Procedure: HEMOSTASIS CLIP PLACEMENT;  Surgeon: Rollin Dover, MD;  Location: [facility];  Service: Endoscopy;;   HEMOSTASIS CLIP PLACEMENT  01/05/2021   Procedure: HEMOSTASIS  CLIP PLACEMENT;  Surgeon: Rollin Dover, MD;  Location: [facility];  Service: Endoscopy;;   HOT HEMOSTASIS N/A 02/27/2018   Procedure: HOT HEMOSTASIS (ARGON PLASMA COAGULATION/BICAP);  Surgeon: Rollin Dover, MD;  Location: [facility];  Service: Endoscopy;  Laterality: N/A;   HOT HEMOSTASIS N/A 08/01/2018   Procedure: HOT HEMOSTASIS (ARGON PLASMA COAGULATION/BICAP);  Surgeon: Rollin Dover, MD;  Location: [facility];  Service: Endoscopy;  Laterality: N/A;   HOT HEMOSTASIS N/A 06/29/2020   Procedure: HOT HEMOSTASIS (ARGON PLASMA COAGULATION/BICAP);  Surgeon: Rollin Dover, MD;  Location: [facility];  Service: Endoscopy;  Laterality: N/A;   HOT HEMOSTASIS N/A 01/05/2021   Procedure: HOT HEMOSTASIS (ARGON PLASMA COAGULATION/BICAP);  Surgeon: Rollin Dover, MD;  Location: [facility];  Service: Endoscopy;  Laterality: N/A;   HOT HEMOSTASIS N/A 12/05/2021   Procedure: HOT HEMOSTASIS (ARGON PLASMA COAGULATION/BICAP);  Surgeon: Rollin Dover, MD;  Location: [facility];  Service: Gastroenterology;  Laterality: N/A;   HOT HEMOSTASIS N/A 02/17/2024   Procedure: EGD, WITH ARGON PLASMA COAGULATION;  Surgeon: Rollin Dover, MD;  Location: [facility];  Service: Gastroenterology;  Laterality: N/A;   IR RADIOLOGIST EVAL & MGMT  12/14/2016   IR RADIOLOGIST EVAL & MGMT  07/26/2021   IR RADIOLOGIST EVAL & MGMT  08/21/2023   LEFT HEART CATH AND CORS/GRAFTS ANGIOGRAPHY N/A 01/09/2018   Procedure: LEFT HEART CATH AND CORS/GRAFTS ANGIOGRAPHY;  Surgeon: Levern Hutching, MD;  Location: [facility];  Service: Cardiovascular;  Laterality: N/A;   LEFT HEART CATHETERIZATION WITH CORONARY ANGIOGRAM N/A 08/03/2014   Procedure: LEFT HEART CATHETERIZATION WITH CORONARY ANGIOGRAM;  Surgeon: Salena GORMAN Negri, MD;  Location: [facility] CATH LAB;  Service: Cardiovascular;  Laterality: N/A;   PATCH ANGIOPLASTY Right 09/26/2021   Procedure: PATCH ANGIOPLASTY RIGHT CAROTID;  Surgeon: Eliza Lonni GORMAN, MD;  Location: [facility] OR;   Service: Vascular;  Laterality: Right;   Post Coronary Artery  BPG  01/05/2000   Right jugular sheath removed   PR VEIN BYPASS GRAFT,AORTO-FEM-POP     ROTATOR CUFF REPAIR     Right    REVIEW OF SYSTEMS:  Constitutional: positive for fatigue Eyes: negative Ears, nose, mouth, throat, and face: negative Respiratory: positive for dyspnea on exertion Cardiovascular: negative Gastrointestinal: negative Genitourinary:negative Integument/breast: negative Hematologic/lymphatic: negative Musculoskeletal:negative Neurological: negative Behavioral/Psych: negative Endocrine: negative Allergic/Immunologic: negative   PHYSICAL EXAMINATION: General appearance: alert, cooperative, fatigued, and no distress Head: Normocephalic, without obvious abnormality, atraumatic Neck: no adenopathy, no JVD, supple, symmetrical, trachea midline, and thyroid  not enlarged, symmetric, no tenderness/mass/nodules Lymph nodes: Cervical, supraclavicular, and axillary nodes normal. Resp: clear to auscultation bilaterally Back: symmetric, no curvature. ROM normal. No CVA tenderness. Cardio: regular rate and rhythm, S1, S2 normal, no murmur, click, rub or gallop GI: soft, non-tender; bowel sounds normal; no masses,  no organomegaly Extremities: extremities normal, atraumatic, no cyanosis or edema Neurologic: Alert and oriented X 3, normal strength and tone. Normal symmetric reflexes. Normal coordination and gait  ECOG PERFORMANCE STATUS: 1 - Symptomatic but completely ambulatory  Blood pressure 136/78, pulse (!) 58, temperature 97.7 F (36.5 C), resp. rate 17, height 5' 2 (1.575 m), weight 138 lb (62.6 kg), SpO2 98%.  LABORATORY DATA: Lab Results  Component Value Date   WBC 7.4 05/13/2024   HGB 8.6 (L) 05/13/2024   HCT 26.4 (L) 05/13/2024   MCV 87.7 05/13/2024   PLT 240 05/13/2024      Chemistry      Component Value Date/Time   NA 137 05/02/2024 1641   NA 139 08/02/2017 0821   K 5.2 (H) 05/02/2024 1641    K 4.3 08/02/2017 0821   CL 100 05/02/2024 1641   CO2 23 05/02/2024 1641   CO2 24 08/02/2017 0821   BUN 27 (H) 05/02/2024 1641   BUN 14.4 08/02/2017 0821   CREATININE 1.90 (H) 05/02/2024 1641   CREATININE 2.60 (H) 02/13/2024 1601   CREATININE 1.3 (H) 08/02/2017 0821      Component Value Date/Time   CALCIUM  9.8 05/02/2024 1641   CALCIUM  9.2 08/02/2017 0821   ALKPHOS 91 05/02/2024 1641   ALKPHOS 66 08/02/2017 0821   AST 24 05/02/2024 1641   AST 14 (L) 05/23/2023 1510   AST 14 08/02/2017 0821   ALT 11 05/02/2024 1641   ALT  7 05/23/2023 1510   ALT 8 08/02/2017 0821   BILITOT 0.2 05/02/2024 1641   BILITOT 0.3 05/23/2023 1510   BILITOT 0.34 08/02/2017 9178       RADIOGRAPHIC STUDIES: CT CHEST WO CONTRAST Result Date: 05/14/2024 CLINICAL DATA:  Non-small cell lung cancer (NSCLC), non-metastatic, assess treatment response. * Tracking Code: BO * EXAM: CT CHEST WITHOUT CONTRAST TECHNIQUE: Multidetector CT imaging of the chest was performed following the standard protocol without IV contrast. RADIATION DOSE REDUCTION: This exam was performed according to the departmental dose-optimization program which includes automated exposure control, adjustment of the mA and/or kV according to patient size and/or use of iterative reconstruction technique. COMPARISON:  CT angiography chest from 11/20/2023. FINDINGS: Cardiovascular: Normal cardiac size. No pericardial effusion. No aortic aneurysm. There are coronary artery calcifications, in keeping with coronary artery disease. There are also moderate peripheral atherosclerotic vascular calcifications of thoracic aorta and its major branches. Mediastinum/Nodes: Visualized thyroid  gland appears grossly unremarkable. No solid / cystic mediastinal masses. The esophagus is nondistended precluding optimal assessment. There are few mildly prominent mediastinal lymph nodes, which do not meet the size criteria for lymphadenopathy and appear grossly similar to the  prior study, favoring benign etiology. No axillary lymphadenopathy by size criteria. Evaluation of bilateral hila is limited due to lack on intravenous contrast: however, no large hilar lymphadenopathy identified. Lungs/Pleura: The central tracheo-bronchial tree is patent. Stable biapical pleural-parenchymal disease. Redemonstration of irregular solid noncalcified mass in the right lung lower lobe with adjacent linear areas of fibrosis/scarring. The mass currently measures 2.2 x 2.4 cm, essentially similar to the prior study, when remeasured in similar fashion. There is stable subpleural subcentimeter focus of calcification in the anterior segment of right upper lobe, inferiorly abutting the minor fissure. No new mass or consolidation. No pleural effusion or pneumothorax. No suspicious lung nodules. Upper Abdomen: Redemonstration of subcapsular 1.3 x 1.5 cm cyst in the left hepatic lobe, segment 2. Surgically absent gallbladder. Remaining visualized upper abdominal viscera within normal limits. Musculoskeletal: The visualized soft tissues of the chest wall are grossly unremarkable. No suspicious osseous lesions. Redemonstration of nonunited fractures of the posteromedial right seventh and eighth ribs. There are mild multilevel degenerative changes in the visualized spine. IMPRESSION: 1. Essentially stable exam. Redemonstration of irregular solid noncalcified mass in the right lung lower lobe with adjacent linear areas of fibrosis/scarring. No significant interval change. No new lung mass or consolidation. No new lymphadenopathy. 2. Multiple other nonacute observations, as described above. Aortic Atherosclerosis (ICD10-I70.0). Electronically Signed   By: Ree Molt M.D.   On: 05/14/2024 09:00     ASSESSMENT AND PLAN:  This is a very pleasant 82 years old white female with a stage IA non-small cell lung cancer, squamous cell carcinoma presented with right lower lobe pulmonary nodule status post stereotactic  radiotherapy.  She is currently on observation and she is feeling fine except for the baseline fatigue and shortness of breath. She had repeat CT scan of the chest, abdomen pelvis performed recently.  She has enlargement of the right lower lobe lung mass concerning for disease progression. The patient had a PET scan performed recently.  I personally independently reviewed the scan and discussed the result with the patient and her daughter today.  The PET scan showed no concerning findings for disease recurrence or metastasis. She is on observation again.  She had repeat CT scan of the chest performed recently.  I personally and independently reviewed the scan and discussed the result with the patient today.  Her scan showed no concerning findings for disease recurrence or progression. Assessment and Plan Assessment & Plan Stage 1A non-small cell lung cancer, status post stereotactic radiotherapy CT scan from October 1st shows stable findings with no new lesions. The noncalcified mass in the right lung, previously treated with radiotherapy, shows scarring and fibrosis without active tumor. No concerning findings in the thyroid  or mediastinal lymph nodes, which are prominent but not enlarged and do not meet criteria for cancer. - Continue surveillance with annual CT scan in one year.  Chronic obstructive pulmonary disease (COPD) COPD exacerbation likely due to recent COVID-19 infection and secondary acute bronchitis. Currently managed with steroids and antibiotics as prescribed by pulmonologist Dr. Ruther. - Use supplemental oxygen  at 3 liters continuously, including during sleep.  Recent COVID-19 infection with secondary acute bronchitis Recent COVID-19 infection has led to secondary acute bronchitis, contributing to COPD exacerbation. Currently under treatment with steroids and antibiotics.  Anemia likely related to chronic kidney disease Persistent anemia with hemoglobin at 8.6 g/dL, previously as  low as 7.5 g/dL. Iron  studies, including ferritin and serum iron , are within normal limits, suggesting anemia is likely related to chronic kidney disease rather than iron  deficiency. - No immediate intervention required as hemoglobin is stable at 8.6 g/dL. - Monitor hemoglobin levels and symptoms. She was advised to call immediately if she has any other concerning symptoms in the interval.  All questions were answered. The patient knows to call the clinic with any problems, questions or concerns. We can certainly see the patient much sooner if necessary.  Disclaimer: This note was dictated with voice recognition software. Similar sounding words can inadvertently be transcribed and may not be corrected upon review.

## 2024-05-21 ENCOUNTER — Encounter: Payer: Self-pay | Admitting: Physician Assistant

## 2024-05-21 ENCOUNTER — Encounter: Payer: Self-pay | Admitting: Internal Medicine

## 2024-05-21 NOTE — Telephone Encounter (Signed)
 Confirmed appt.

## 2024-05-27 ENCOUNTER — Encounter (HOSPITAL_COMMUNITY): Payer: Self-pay

## 2024-06-01 ENCOUNTER — Other Ambulatory Visit: Payer: Self-pay | Admitting: *Deleted

## 2024-06-01 ENCOUNTER — Telehealth: Payer: Self-pay | Admitting: *Deleted

## 2024-06-02 ENCOUNTER — Encounter: Payer: Self-pay | Admitting: Internal Medicine

## 2024-06-02 ENCOUNTER — Encounter: Payer: Self-pay | Admitting: Physician Assistant

## 2024-06-02 NOTE — Telephone Encounter (Signed)
  error

## 2024-06-03 ENCOUNTER — Ambulatory Visit: Payer: Self-pay

## 2024-06-03 ENCOUNTER — Encounter: Payer: Self-pay | Admitting: Pulmonary Disease

## 2024-06-03 ENCOUNTER — Ambulatory Visit: Admitting: Pulmonary Disease

## 2024-06-03 VITALS — BP 118/62 | HR 66 | Temp 97.6°F | Ht 62.0 in | Wt 135.6 lb

## 2024-06-03 DIAGNOSIS — Z87891 Personal history of nicotine dependence: Secondary | ICD-10-CM

## 2024-06-03 DIAGNOSIS — D638 Anemia in other chronic diseases classified elsewhere: Secondary | ICD-10-CM

## 2024-06-03 DIAGNOSIS — C3490 Malignant neoplasm of unspecified part of unspecified bronchus or lung: Secondary | ICD-10-CM | POA: Diagnosis not present

## 2024-06-03 DIAGNOSIS — R5382 Chronic fatigue, unspecified: Secondary | ICD-10-CM

## 2024-06-03 DIAGNOSIS — J449 Chronic obstructive pulmonary disease, unspecified: Secondary | ICD-10-CM

## 2024-06-03 DIAGNOSIS — J9610 Chronic respiratory failure, unspecified whether with hypoxia or hypercapnia: Secondary | ICD-10-CM | POA: Diagnosis not present

## 2024-06-03 DIAGNOSIS — J9611 Chronic respiratory failure with hypoxia: Secondary | ICD-10-CM

## 2024-06-03 NOTE — Patient Instructions (Addendum)
 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

## 2024-06-03 NOTE — Telephone Encounter (Signed)
 OV 10/22

## 2024-06-03 NOTE — Telephone Encounter (Signed)
 FYI Only or Action Required?: FYI only for provider.  Patient is followed in Pulmonology for Follow Up , last seen on 05/18/2024 by Parrett, Madelin RAMAN, NP.  Called Nurse Triage reporting Low O2 Saturation.  Symptoms began today.  Interventions attempted: Home oxygen  use.  Symptoms are: stable.  Triage Disposition: Call PCP Within 24 Hours  Patient/caregiver understands and will follow disposition?: Yes  **Appt. Scheduled for 10/22**       Copied from CRM #[id number]. Topic: Clinical - Red Word Triage >> Jun 03, 2024 12:04 PM Rozanna MATSU wrote: Red Word that prompted transfer to Nurse Triage:  pt stated her big machine is acting up while she is sleeping, the little machine is skipping beat. Stated her oxygen  is dropping Reason for Disposition  [1] Receiving oxygen  therapy (e.g., nasal cannula, mask) AND [2] oxygen  level 96% or higher AND [3] three times in 1 week  Answer Assessment - Initial Assessment Questions 1. MAIN CONCERN OR SYMPTOM: What's your main concern? (e.g., low oxygen  level, breathing difficulty) What question do you have?      Patient reports Oxygen  machine is not working properly, home machine and portable machine is malfunctioning, she stated the machine keeps beeping, and giving her an alert.  2. ONSET: When did the  symptoms  start?       X 1 day   3. OXYGEN  THERAPY: Yes, home O2       4.  OXYGEN  EQUIPMENT:  Are you having any trouble with your oxygen  equipment?  (e.g., cannula, mask, tubing, tank, concentrator)      Concentrator both home and portable keep beeping/alerting    6. OXYGEN  LEVEL: What is your reading (oxygen  level) today? What is your usual oxygen  saturation reading? (e.g., 95%)      96 % during triage call HR 54    8. BREATHING DIFFICULTY: Are you having any difficulty breathing? If Yes, ask: How bad is it?  (e.g., none, mild, moderate, severe)      Chronic SOB   9. OTHER SYMPTOMS: Do you have any other symptoms?  (e.g., fever, change in sputum)  No   Appt. Scheduled for 10/22 to determine if O2 machine is malfunctioning, and to discuss dropping oxygen  saturation.  Protocols used: Oxygen  Monitoring and Hypoxia-A-AH

## 2024-06-03 NOTE — Progress Notes (Signed)
 [name]    [id number]    27-Nov-1940  Primary Care Physician:Bouska, Alm, MD  Referring Physician: Pura Alm, MD [address] [place],  [place] [address]  Chief complaint:   Patient being seen for shortness of breath Low oxygen  with activity  HPI:  Has been noticing low oxygen  with activity Woken out of sleep with some noise from a oxygen  concentrator  Her portable concentrator has also been making some noise and then when she checks her oxygen  levels, it is in the 60s  Recently tested positive for COVID following which she has had worsening shortness of breath, was treated with Paxlovid and prednisone .  Increased use of breathing treatments  She does have a history of obstructive lung disease, former smoker.  She is on oxygen  supplementation around-the-clock for chronic respiratory failure, history of non-small cell lung cancer s/p SBRT. Peripheral vascular disease, diastolic heart failure, coronary artery disease  She is compliant with current medications including Trelegy and albuterol   Uses oxygen  at 3 L around-the-clock  Has had multiple situations where she will feel lightheaded, almost fall, this has happened in multiple situations, happens when she is not really exerting herself where she will feel lightheaded feel like she is going to pass out check oxygen  levels and it would be in the low 60s  Has not really had any chest pains or chest discomfort, not really bringing up any secretions at present  Outpatient Encounter Medications as of 06/03/2024  Medication Sig   acetaminophen  (TYLENOL ) 500 MG tablet Take 500-1,000 mg by mouth every 6 (six) hours as needed (for pain).   albuterol  (PROVENTIL ) (2.5 MG/3ML) 0.083% nebulizer solution Take 3 mLs (2.5 mg total) by nebulization 3 (three) times daily for 5 days, THEN 3 mLs (2.5 mg total) every 6 (six) hours as needed for wheezing or shortness of breath.   albuterol  (VENTOLIN  HFA) 108  (90 Base) MCG/ACT inhaler Inhale 2 puffs into the lungs every 6 (six) hours as needed for wheezing or shortness of breath.   amLODipine  (NORVASC ) 2.5 MG tablet Take 2.5 mg by mouth daily.   atorvastatin  (LIPITOR) 40 MG tablet Take 1 tablet (40 mg total) by mouth at bedtime.   cetirizine  (ZYRTEC ) 10 MG tablet Take 10 mg by mouth daily as needed for allergies.    clopidogrel  (PLAVIX ) 75 MG tablet Take 0.5 tablets (37.5 mg total) by mouth in the morning.   dexlansoprazole  (DEXILANT ) 60 MG capsule Take 60 mg by mouth daily before breakfast.   Fluticasone -Umeclidin-Vilant (TRELEGY ELLIPTA ) 200-62.5-25 MCG/ACT AEPB Inhale 1 puff into the lungs daily.   furosemide  (LASIX ) 40 MG tablet Take 20 mg by mouth in the morning.   nitroGLYCERIN  (NITRODUR - DOSED IN MG/24 HR) 0.2 mg/hr patch Place 0.2 mg onto the skin daily. Patient wears patch for 12 hours and removes   nitroGLYCERIN  (NITROSTAT ) 0.4 MG SL tablet Place 1 tablet (0.4 mg total) under the tongue every 5 (five) minutes x 3 doses as needed for chest pain.   ondansetron  (ZOFRAN ) 4 MG tablet Take 1 tablet (4 mg total) by mouth every 6 (six) hours as needed for nausea.   Rimegepant Sulfate 75 MG TBDP Take 75 mg by mouth.   benzonatate  (TESSALON ) 200 MG capsule Take 1 capsule (200 mg total) by mouth 3 (three) times daily as needed. (Patient not taking: Reported on 06/03/2024)   [DISCONTINUED] amoxicillin -clavulanate (AUGMENTIN ) 875-125 MG tablet Take 1 tablet by mouth 2 (two) times  daily.   [DISCONTINUED] olmesartan  (BENICAR ) 20 MG tablet Take 1 tablet (20 mg total) by mouth daily. (Patient taking differently: Take 10 mg by mouth daily.)   [DISCONTINUED] predniSONE  (DELTASONE ) 10 MG tablet 4 tabs for 2 days, then 3 tabs for 2 days, 2 tabs for 2 days, then 1 tab for 2 days, then stop   No facility-administered encounter medications on file as of 06/03/2024.    Allergies as of 06/03/2024 - Review Complete 06/03/2024  Allergen Reaction Noted   Avelox  [moxifloxacin hcl in nacl] Palpitations and Other (See Comments) 11/27/2011   Azithromycin  Swelling and Other (See Comments) 01/23/2017   Codeine Other (See Comments) 11/27/2011   Doxycycline  Swelling and Other (See Comments)    Hydromorphone  Palpitations and Other (See Comments) 01/05/2015   Levaquin  [levofloxacin ] Shortness Of Breath and Other (See Comments) 10/22/2015   Vitamin d analogs Swelling and Other (See Comments) 01/15/2020   Nifedipine er Other (See Comments) 11/18/2021   Octreotide   12/27/2021   Zetia [ezetimibe] Other (See Comments) 05/24/2023   Oxycodone-acetaminophen  Other (See Comments) 11/27/2011   Risedronate Other (See Comments) 08/02/2014    Past Medical History:  Diagnosis Date   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HCC)    Arnold-Chiari malformation (HCC) 1998   Asthma    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   Blood in stool    last week of aug 2018   CAP (community acquired pneumonia) 10/22/2015   Chronic kidney disease    COPD (chronic obstructive pulmonary disease) (HCC)    Coronary artery disease    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   Diverticulitis    Dyspnea    with exertion   Gastroesophageal reflux disease    occ   Glaucoma    right eye   Headache syndrome 11/27/2018   Hiatal hernia    History of shingles 06/23/2018   Hyperlipidemia    Hypertension    Lung cancer (HCC) dx 2018   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   Myocardial infarction (HCC) 01/01/2000   Cardiac catheterization   Peripheral vascular disease    stents in legs x 2 or 3   Pneumonia    last time winter 2017 -2018   PONV (postoperative nausea and vomiting)    occassionally, last colonscopy did ok with anesthesia   Primary cancer of right lower lobe of lung (HCC) 04/25/2016   Right-sided carotid artery disease 01/07/2013   TIA (transient ischemic attack) 02/11/2019   Wears dentures    Full set   Wears glasses     Past  Surgical History:  Procedure Laterality Date   ABDOMINAL HYSTERECTOMY     APPENDECTOMY     Arnold-chiari malformation repair  1998   Suboccipital craniectomy   CAROTID ENDARTERECTOMY  03/29/2010   Left  CEA   CHOLECYSTECTOMY     Gall Bladder   COLONOSCOPY WITH PROPOFOL  N/A 04/22/2015   Procedure: COLONOSCOPY WITH PROPOFOL ;  Surgeon: Belvie Just, MD;  Location: [facility];  Service: Endoscopy;  Laterality: N/A;   COLONOSCOPY WITH PROPOFOL  N/A 05/25/2016   Procedure: COLONOSCOPY WITH PROPOFOL ;  Surgeon: Belvie Just, MD;  Location: [facility];  Service: Endoscopy;  Laterality: N/A;   COLONOSCOPY WITH PROPOFOL  N/A 05/03/2017   Procedure: COLONOSCOPY WITH PROPOFOL ;  Surgeon: Just Belvie, MD;  Location: [facility];  Service: Endoscopy;  Laterality: N/A;   COLONOSCOPY WITH PROPOFOL  N/A 06/29/2020   Procedure: COLONOSCOPY WITH PROPOFOL ;  Surgeon: Just Belvie, MD;  Location: [facility];  Service: Endoscopy;  Laterality: N/A;   COLONOSCOPY WITH PROPOFOL  N/A 01/05/2021   Procedure: COLONOSCOPY WITH PROPOFOL ;  Surgeon: Rollin Dover, MD;  Location: [facility];  Service: Endoscopy;  Laterality: N/A;   COLONOSCOPY WITH PROPOFOL  N/A 09/28/2021   Procedure: COLONOSCOPY WITH PROPOFOL ;  Surgeon: Rollin Dover, MD;  Location: [facility] ENDOSCOPY;  Service: Endoscopy;  Laterality: N/A;   CORNEAL TRANSPLANT     Right   CORONARY ARTERY BYPASS GRAFT  01/01/2000   x 3   ENDARTERECTOMY Right 09/26/2021   Procedure: RIGHT CAROTID ENDARTERECTOMY;  Surgeon: Eliza Lonni RAMAN, MD;  Location: [facility] OR;  Service: Vascular;  Laterality: Right;   ENTEROSCOPY N/A 02/27/2018   Procedure: ENTEROSCOPY;  Surgeon: Rollin Dover, MD;  Location: [facility];  Service: Endoscopy;  Laterality: N/A;   ENTEROSCOPY N/A 07/18/2018   Procedure: ENTEROSCOPY;  Surgeon: Rollin Dover, MD;  Location: [facility];  Service: Endoscopy;  Laterality: N/A;   ENTEROSCOPY N/A 06/29/2020   Procedure: ENTEROSCOPY;  Surgeon: Rollin Dover,  MD;  Location: [facility];  Service: Endoscopy;  Laterality: N/A;   ENTEROSCOPY N/A 12/05/2021   Procedure: ENTEROSCOPY;  Surgeon: Rollin Dover, MD;  [facility]: [facility];  Service: Gastroenterology;  Laterality: N/A;   ENTEROSCOPY N/A 12/25/2021   Procedure: ENTEROSCOPY;  Surgeon: Rollin Dover, MD;  Location: [facility];  Service: Gastroenterology;  Laterality: N/A;  with endoscopic placement of video capsule   ENTEROSCOPY N/A 02/17/2024   Procedure: ENTEROSCOPY;  Surgeon: Rollin Dover, MD;  Location: [facility];  Service: Gastroenterology;  Laterality: N/A;   ESOPHAGOGASTRODUODENOSCOPY N/A 05/25/2016   Procedure: ESOPHAGOGASTRODUODENOSCOPY (EGD);  Surgeon: Dover Rollin, MD;  Location: [facility];  Service: Endoscopy;  Laterality: N/A;   ESOPHAGOGASTRODUODENOSCOPY N/A 08/01/2018   Procedure: ESOPHAGOGASTRODUODENOSCOPY (EGD);  Surgeon: Rollin Dover, MD;  Location: [facility];  Service: Endoscopy;  Laterality: N/A;   ESOPHAGOGASTRODUODENOSCOPY (EGD) WITH PROPOFOL  N/A 09/28/2021   Procedure: ESOPHAGOGASTRODUODENOSCOPY (EGD) WITH PROPOFOL ;  Surgeon: Rollin Dover, MD;  Location: [facility] ENDOSCOPY;  Service: Endoscopy;  Laterality: N/A;   EYE SURGERY Right 1995 or 1996   Laser surgery for retinal hemorrhage   GIVENS CAPSULE STUDY N/A 07/16/2018   Procedure: GIVENS CAPSULE STUDY;  Surgeon: Rollin Dover, MD;  Location: [facility];  Service: Endoscopy;  Laterality: N/A;   GIVENS CAPSULE STUDY N/A 12/14/2021   Procedure: GIVENS CAPSULE STUDY;  Surgeon: Rollin Dover, MD;  Location: [facility] ENDOSCOPY;  Service: Gastroenterology;  Laterality: N/A;   GIVENS CAPSULE STUDY N/A 12/25/2021   Procedure: GIVENS CAPSULE STUDY;  Surgeon: Rollin Dover, MD;  Location: [facility];  Service: Gastroenterology;  Laterality: N/A;   HEMOSTASIS CLIP PLACEMENT  06/29/2020   Procedure: HEMOSTASIS CLIP PLACEMENT;  Surgeon: Rollin Dover, MD;  Location: [facility];  Service: Endoscopy;;   HEMOSTASIS CLIP PLACEMENT   01/05/2021   Procedure: HEMOSTASIS CLIP PLACEMENT;  Surgeon: Rollin Dover, MD;  Location: [facility];  Service: Endoscopy;;   HOT HEMOSTASIS N/A 02/27/2018   Procedure: HOT HEMOSTASIS (ARGON PLASMA COAGULATION/BICAP);  Surgeon: Rollin Dover, MD;  Location: [facility];  Service: Endoscopy;  Laterality: N/A;   HOT HEMOSTASIS N/A 08/01/2018   Procedure: HOT HEMOSTASIS (ARGON PLASMA COAGULATION/BICAP);  Surgeon: Rollin Dover, MD;  Location: [facility];  Service: Endoscopy;  Laterality: N/A;   HOT HEMOSTASIS N/A 06/29/2020   Procedure: HOT HEMOSTASIS (ARGON PLASMA COAGULATION/BICAP);  Surgeon: Rollin Dover, MD;  Location: [facility];  Service: Endoscopy;  Laterality: N/A;   HOT HEMOSTASIS N/A 01/05/2021   Procedure: HOT HEMOSTASIS (ARGON PLASMA COAGULATION/BICAP);  Surgeon: Rollin Dover, MD;  Location: [facility];  Service: Endoscopy;  Laterality: N/A;   HOT HEMOSTASIS N/A 12/05/2021   Procedure: HOT HEMOSTASIS (ARGON PLASMA COAGULATION/BICAP);  Surgeon: Rollin Dover, MD;  Location: [facility];  Service: Gastroenterology;  Laterality: N/A;   HOT HEMOSTASIS N/A 02/17/2024   Procedure: EGD, WITH ARGON PLASMA COAGULATION;  Surgeon: Rollin Dover, MD;  Location: [facility];  Service: Gastroenterology;  Laterality: N/A;   IR RADIOLOGIST EVAL & MGMT  12/14/2016   IR RADIOLOGIST EVAL & MGMT  07/26/2021   IR RADIOLOGIST EVAL & MGMT  08/21/2023   LEFT HEART CATH AND CORS/GRAFTS ANGIOGRAPHY N/A 01/09/2018   Procedure: LEFT HEART CATH AND CORS/GRAFTS ANGIOGRAPHY;  Surgeon: Levern Hutching, MD;  Location: [facility];  Service: Cardiovascular;  Laterality: N/A;   LEFT HEART CATHETERIZATION WITH CORONARY ANGIOGRAM N/A 08/03/2014   Procedure: LEFT HEART CATHETERIZATION WITH CORONARY ANGIOGRAM;  Surgeon: Salena GORMAN Negri, MD;  Location: [facility] CATH LAB;  Service: Cardiovascular;  Laterality: N/A;   PATCH ANGIOPLASTY Right 09/26/2021   Procedure: PATCH ANGIOPLASTY RIGHT CAROTID;  Surgeon: Eliza Lonni GORMAN, MD;  Location: [facility] OR;  Service: Vascular;  Laterality: Right;   Post Coronary Artery  BPG  01/05/2000   Right jugular sheath removed   PR VEIN BYPASS GRAFT,AORTO-FEM-POP     ROTATOR CUFF REPAIR     Right    Family History  Problem Relation Age of Onset   Heart disease Mother        Heart Disease before age 60   Hypertension Mother    Hyperlipidemia Mother    Heart attack Mother    Clotting disorder Mother    Heart disease Father        Heart Disease before age 58   Heart attack Father    Hyperlipidemia Father    Hypertension Father    Heart disease Brother        Heart Disease before age 55   Hyperlipidemia Brother    Hypertension Brother    Clotting disorder Brother    AAA (abdominal aortic aneurysm) Brother    Cerebral aneurysm Sister    Hypertension Sister    AAA (abdominal aortic aneurysm) Sister    Asthma Sister    Cerebral aneurysm Brother    Cancer Brother        Lung   Hypertension Brother    Heart attack Brother    Heart disease Brother        Aneurysm of Brain   Hypertension Brother    Heart disease Brother    Heart disease Brother    Stroke Son        Aneurysm of Stomach   AAA (abdominal aortic aneurysm) Son    Cancer Maternal Uncle        great uncle/cancer/type unknown    Social History   Socioeconomic History   Marital status: Widowed    Spouse name: Not on file   Number of children: 4   Years of education: 7TH   Highest education level: Not on file  Occupational History   Not on file  Tobacco Use   Smoking status: Former    Current packs/day: 0.00    Average packs/day: 1.5 packs/day for 30.0 years (45.0 ttl pk-yrs)    Types: Cigarettes    Start date: 08/13/1970    Quit date: 08/13/2000    Years since quitting: 23.8    Passive exposure: Never   Smokeless tobacco: Never   Tobacco comments:    Smoked for about 40 years - smoked about  1/2 ppd  Vaping Use   Vaping status: Never Used  Substance and Sexual Activity   Alcohol  use: No    Alcohol/week: 0.0 standard drinks of alcohol   Drug use: No   Sexual activity: Never  Other Topics Concern   Not on file  Social History Narrative   Lives at home w/ her son   Right-handed   Drinks coffee and Pepsi daily   Social Drivers of Health   Financial Resource Strain: Low Risk  (02/24/2024)   Received from [facility]   Overall Financial Resource Strain (CARDIA)    How hard is it for you to pay for the very basics like food, housing, medical care, and heating?: Not hard at all  Food Insecurity: No Food Insecurity (02/24/2024)   Received from [facility]   Hunger Vital Sign    Within the past 12 months, you worried that your food would run out before you got the money to buy more.: Never true    Within the past 12 months, the food you bought just didn't last and you didn't have money to get more.: Never true  Transportation Needs: No Transportation Needs (02/24/2024)   Received from [facility] - Transportation    In the past 12 months, has lack of transportation kept you from medical appointments or from getting medications?: No    In the past 12 months, has lack of transportation kept you from meetings, work, or from getting things needed for daily living?: No  Physical Activity: Inactive (02/24/2024)   Received from [facility]   Exercise Vital Sign    On average, how many days per week do you engage in moderate to strenuous exercise (like a brisk walk)?: 0 days    Minutes of Exercise per Session: Not on file  Stress: No Stress Concern Present (02/24/2024)   Received from [facility] - Occupational Stress Questionnaire    Do you feel stress - tense, restless, nervous, or anxious, or unable to sleep at night because your mind is troubled all the time - these days?: Only a little  Social Connections: Socially Integrated (02/24/2024)   Received from [facility]   Social Network    How would you rate  your social network (family, work, friends)?: Good participation with social networks  Intimate Partner Violence: Not At Risk (02/24/2024)   Received from [facility]   HITS    Over the last 12 months how often did your partner physically hurt you?: Never    Over the last 12 months how often did your partner insult you or talk down to you?: Never    Over the last 12 months how often did your partner threaten you with physical harm?: Never    Over the last 12 months how often did your partner scream or curse at you?: Never    Review of Systems  Constitutional:  Positive for fatigue.  Respiratory:  Positive for shortness of breath.   Cardiovascular:  Negative for palpitations.  Musculoskeletal:  Negative for arthralgias.    Vitals:   06/03/24 1439  BP: 118/62  Pulse: 66  Temp: 97.6 F (36.4 C)  SpO2: 99%     Physical Exam Constitutional:      Appearance: She is well-developed.  HENT:     Head: Normocephalic.     Mouth/Throat:     Mouth: Mucous membranes are moist.  Eyes:     General: No scleral  icterus. Neck:     Thyroid : No thyromegaly.  Cardiovascular:     Rate and Rhythm: Normal rate and regular rhythm.     Heart sounds:     No friction rub.  Pulmonary:     Effort: No respiratory distress.     Breath sounds: No stridor. No wheezing or rhonchi.  Musculoskeletal:     Cervical back: No rigidity or tenderness.  Neurological:     Mental Status: She is alert.  Psychiatric:        Mood and Affect: Mood normal.    Data Reviewed: CT chest was reviewed with the patient showing a stable right mass in the right lower lobe, some fibrosis and scarring, no significant change from previous  Echocardiogram in May 2025 with ejection fraction of 65 to 70% normal right ventricular systolic function   Assessment/Plan: Shortness of breath  Chronic respiratory failure on oxygen  supplementation  Chronic obstructive pulmonary disease - Compliant with  bronchodilators  Chronic fatigue  Lightheadedness and near syncopal episodes  History of lung cancer - Post stereotactic radiotherapy to the right lower lobe lung nodule  Anemia of chronic disease  Encouraged to continue bronchodilators Continue oxygen  supplementation  I did advise her to have her machine looked at again by [facility] to make sure the machines are working well  We will schedule for repeat pulmonary function test  Symptoms of lightheadedness, near syncopal episodes may be related to anemia or a dysautonomia  Has a return appointment in a couple of weeks Will try and repeat a pulmonary function test at that time to compare with previous Has no symptoms to suggest an ongoing infectious process at present   Jennet Epley MD [facility] 06/03/2024, 7:29 PM  CC: Pura Lenis, MD

## 2024-06-15 ENCOUNTER — Ambulatory Visit (HOSPITAL_COMMUNITY)

## 2024-06-26 ENCOUNTER — Encounter (HOSPITAL_BASED_OUTPATIENT_CLINIC_OR_DEPARTMENT_OTHER)

## 2024-06-29 ENCOUNTER — Ambulatory Visit: Admitting: Adult Health

## 2024-07-23 ENCOUNTER — Encounter (HOSPITAL_BASED_OUTPATIENT_CLINIC_OR_DEPARTMENT_OTHER)

## 2024-07-23 ENCOUNTER — Ambulatory Visit: Admitting: Pulmonary Disease

## 2024-08-25 ENCOUNTER — Encounter (HOSPITAL_COMMUNITY): Payer: Self-pay

## 2024-09-09 ENCOUNTER — Telehealth: Payer: Self-pay

## 2024-09-09 NOTE — Telephone Encounter (Signed)
 Spoke patient in regards to visit with PCP (Dr. Pura) and lab results.  Patient states that her iron  is low and starting to feel weak.  Advised her that I would let providers know and give her a call back with recommendations.  She voiced understanding.

## 2024-09-09 NOTE — Telephone Encounter (Signed)
 Scheduled patient for lab and follow up with Cassie, PA on 09/25/24 for labs @ 9 AM and visit with Cassie, PA @ 0930 AM.  She voiced understanding.

## 2024-09-25 ENCOUNTER — Inpatient Hospital Stay: Admitting: Physician Assistant

## 2024-09-25 ENCOUNTER — Inpatient Hospital Stay

## 2024-11-26 ENCOUNTER — Ambulatory Visit: Admitting: Dermatology

## 2024-12-02 ENCOUNTER — Ambulatory Visit: Admitting: Dermatology

## 2025-05-11 ENCOUNTER — Other Ambulatory Visit

## 2025-05-11 ENCOUNTER — Other Ambulatory Visit (HOSPITAL_COMMUNITY)

## 2025-05-19 ENCOUNTER — Ambulatory Visit: Admitting: Internal Medicine
